# Patient Record
Sex: Male | Born: 1950 | Race: White | Hispanic: No | Marital: Married | Smoking: Former smoker
Health system: Southern US, Community
[De-identification: ages and names within clinical notes are randomized; demographics above are authoritative.]

## PROBLEM LIST (undated history)

## (undated) ENCOUNTER — Ambulatory Visit: Discharge status: Absent without leave | Payer: PPO

## (undated) DIAGNOSIS — C179 Malignant neoplasm of small intestine, unspecified: Secondary | ICD-10-CM

## (undated) DIAGNOSIS — I4891 Unspecified atrial fibrillation: Secondary | ICD-10-CM

## (undated) DIAGNOSIS — Z952 Presence of prosthetic heart valve: Secondary | ICD-10-CM

## (undated) DIAGNOSIS — Z9289 Personal history of other medical treatment: Secondary | ICD-10-CM

## (undated) DIAGNOSIS — D649 Anemia, unspecified: Secondary | ICD-10-CM

## (undated) DIAGNOSIS — Z95810 Presence of automatic (implantable) cardiac defibrillator: Secondary | ICD-10-CM

## (undated) DIAGNOSIS — I5022 Chronic systolic (congestive) heart failure: Secondary | ICD-10-CM

## (undated) DIAGNOSIS — I00 Rheumatic fever without heart involvement: Secondary | ICD-10-CM

## (undated) DIAGNOSIS — T7840XA Allergy, unspecified, initial encounter: Secondary | ICD-10-CM

## (undated) DIAGNOSIS — I509 Heart failure, unspecified: Secondary | ICD-10-CM

## (undated) DIAGNOSIS — R238 Other skin changes: Secondary | ICD-10-CM

## (undated) DIAGNOSIS — I099 Rheumatic heart disease, unspecified: Secondary | ICD-10-CM

## (undated) DIAGNOSIS — M1990 Unspecified osteoarthritis, unspecified site: Secondary | ICD-10-CM

## (undated) DIAGNOSIS — H409 Unspecified glaucoma: Secondary | ICD-10-CM

## (undated) DIAGNOSIS — C8580 Other specified types of non-Hodgkin lymphoma, unspecified site: Secondary | ICD-10-CM

## (undated) DIAGNOSIS — R600 Localized edema: Secondary | ICD-10-CM

## (undated) DIAGNOSIS — N200 Calculus of kidney: Secondary | ICD-10-CM

## (undated) DIAGNOSIS — R609 Edema, unspecified: Secondary | ICD-10-CM

## (undated) DIAGNOSIS — N400 Enlarged prostate without lower urinary tract symptoms: Secondary | ICD-10-CM

## (undated) DIAGNOSIS — R0602 Shortness of breath: Secondary | ICD-10-CM

## (undated) DIAGNOSIS — R351 Nocturia: Secondary | ICD-10-CM

## (undated) DIAGNOSIS — R7401 Elevation of levels of liver transaminase levels: Secondary | ICD-10-CM

## (undated) DIAGNOSIS — I10 Essential (primary) hypertension: Secondary | ICD-10-CM

## (undated) DIAGNOSIS — G4700 Insomnia, unspecified: Secondary | ICD-10-CM

## (undated) DIAGNOSIS — J111 Influenza due to unidentified influenza virus with other respiratory manifestations: Secondary | ICD-10-CM

## (undated) DIAGNOSIS — G629 Polyneuropathy, unspecified: Secondary | ICD-10-CM

## (undated) DIAGNOSIS — K219 Gastro-esophageal reflux disease without esophagitis: Secondary | ICD-10-CM

## (undated) DIAGNOSIS — K5900 Constipation, unspecified: Secondary | ICD-10-CM

## (undated) DIAGNOSIS — E785 Hyperlipidemia, unspecified: Secondary | ICD-10-CM

## (undated) DIAGNOSIS — R233 Spontaneous ecchymoses: Secondary | ICD-10-CM

## (undated) DIAGNOSIS — D509 Iron deficiency anemia, unspecified: Secondary | ICD-10-CM

## (undated) DIAGNOSIS — I350 Nonrheumatic aortic (valve) stenosis: Secondary | ICD-10-CM

## (undated) DIAGNOSIS — E119 Type 2 diabetes mellitus without complications: Secondary | ICD-10-CM

## (undated) DIAGNOSIS — Z860100 Personal history of colon polyps, unspecified: Secondary | ICD-10-CM

## (undated) DIAGNOSIS — I4821 Permanent atrial fibrillation: Secondary | ICD-10-CM

## (undated) DIAGNOSIS — I2510 Atherosclerotic heart disease of native coronary artery without angina pectoris: Secondary | ICD-10-CM

## (undated) HISTORY — PX: CORONARY ARTERY BYPASS GRAFT: SHX141

## (undated) HISTORY — DX: Anemia, unspecified: D64.9

## (undated) HISTORY — DX: Allergy, unspecified, initial encounter: T78.40XA

## (undated) HISTORY — DX: Rheumatic heart disease, unspecified: I09.9

## (undated) HISTORY — DX: Shortness of breath: R06.02

## (undated) HISTORY — PX: SMALL INTESTINE SURGERY: SHX150

## (undated) HISTORY — DX: Essential (primary) hypertension: I10

## (undated) HISTORY — DX: Unspecified atrial fibrillation: I48.91

## (undated) HISTORY — DX: Rheumatic fever without heart involvement: I00

## (undated) HISTORY — PX: COLONOSCOPY: SHX174

## (undated) HISTORY — DX: Heart failure, unspecified: I50.9

## (undated) HISTORY — PX: HERNIA REPAIR: SHX51

## (undated) HISTORY — DX: Atherosclerotic heart disease of native coronary artery without angina pectoris: I25.10

## (undated) HISTORY — DX: Gastro-esophageal reflux disease without esophagitis: K21.9

## (undated) HISTORY — DX: Hyperlipidemia, unspecified: E78.5

## (undated) HISTORY — PX: SEPTOPLASTY: SUR1290

## (undated) HISTORY — DX: Presence of prosthetic heart valve: Z95.2

## (undated) HISTORY — PX: ESOPHAGOGASTRODUODENOSCOPY: SHX1529

---

## 2000-09-10 HISTORY — PX: CORONARY ARTERY BYPASS GRAFT: SHX141

## 2000-09-10 HISTORY — PX: CARDIAC CATHETERIZATION: SHX172

## 2000-10-31 ENCOUNTER — Ambulatory Visit (HOSPITAL_COMMUNITY): Admission: RE | Admit: 2000-10-31 | Discharge: 2000-10-31 | Payer: Self-pay | Admitting: *Deleted

## 2000-11-07 ENCOUNTER — Ambulatory Visit (HOSPITAL_COMMUNITY): Admission: RE | Admit: 2000-11-07 | Discharge: 2000-11-07 | Payer: Self-pay | Admitting: Cardiovascular Disease

## 2000-11-08 ENCOUNTER — Encounter (HOSPITAL_COMMUNITY): Admission: RE | Admit: 2000-11-08 | Discharge: 2001-02-06 | Payer: Self-pay | Admitting: Dentistry

## 2000-11-08 DIAGNOSIS — Z952 Presence of prosthetic heart valve: Secondary | ICD-10-CM

## 2000-11-08 HISTORY — PX: MITRAL VALVE REPLACEMENT: SHX147

## 2000-11-08 HISTORY — DX: Presence of prosthetic heart valve: Z95.2

## 2000-11-18 ENCOUNTER — Encounter: Payer: Self-pay | Admitting: Cardiothoracic Surgery

## 2000-11-20 ENCOUNTER — Inpatient Hospital Stay (HOSPITAL_COMMUNITY): Admission: RE | Admit: 2000-11-20 | Discharge: 2000-11-26 | Payer: Self-pay | Admitting: Cardiothoracic Surgery

## 2000-11-20 ENCOUNTER — Encounter (INDEPENDENT_AMBULATORY_CARE_PROVIDER_SITE_OTHER): Payer: Self-pay | Admitting: *Deleted

## 2000-11-20 ENCOUNTER — Encounter: Payer: Self-pay | Admitting: Cardiothoracic Surgery

## 2000-11-21 ENCOUNTER — Encounter: Payer: Self-pay | Admitting: Surgery

## 2000-11-22 ENCOUNTER — Encounter: Payer: Self-pay | Admitting: Cardiothoracic Surgery

## 2000-11-23 ENCOUNTER — Encounter: Payer: Self-pay | Admitting: Cardiothoracic Surgery

## 2004-04-17 ENCOUNTER — Ambulatory Visit (HOSPITAL_COMMUNITY): Admission: RE | Admit: 2004-04-17 | Discharge: 2004-04-17 | Payer: Self-pay | Admitting: Urology

## 2004-08-23 ENCOUNTER — Ambulatory Visit: Payer: Self-pay | Admitting: Cardiology

## 2004-09-05 ENCOUNTER — Ambulatory Visit: Payer: Self-pay | Admitting: Cardiology

## 2005-01-05 ENCOUNTER — Ambulatory Visit: Payer: Self-pay | Admitting: Family Medicine

## 2005-04-04 ENCOUNTER — Ambulatory Visit: Payer: Self-pay | Admitting: Family Medicine

## 2005-12-13 ENCOUNTER — Ambulatory Visit: Payer: Self-pay | Admitting: Family Medicine

## 2006-01-11 ENCOUNTER — Ambulatory Visit: Payer: Self-pay | Admitting: Family Medicine

## 2006-01-18 ENCOUNTER — Ambulatory Visit: Payer: Self-pay | Admitting: Family Medicine

## 2006-04-22 ENCOUNTER — Ambulatory Visit: Payer: Self-pay | Admitting: Family Medicine

## 2006-08-05 ENCOUNTER — Ambulatory Visit: Payer: Self-pay | Admitting: Family Medicine

## 2008-11-05 ENCOUNTER — Encounter: Payer: Self-pay | Admitting: Pulmonary Disease

## 2008-11-30 ENCOUNTER — Ambulatory Visit: Payer: Self-pay | Admitting: Pulmonary Disease

## 2008-11-30 DIAGNOSIS — I10 Essential (primary) hypertension: Secondary | ICD-10-CM | POA: Insufficient documentation

## 2008-11-30 DIAGNOSIS — J984 Other disorders of lung: Secondary | ICD-10-CM | POA: Insufficient documentation

## 2008-11-30 DIAGNOSIS — E785 Hyperlipidemia, unspecified: Secondary | ICD-10-CM | POA: Insufficient documentation

## 2008-11-30 DIAGNOSIS — J8409 Other alveolar and parieto-alveolar conditions: Secondary | ICD-10-CM | POA: Insufficient documentation

## 2009-03-18 ENCOUNTER — Telehealth: Payer: Self-pay | Admitting: Pulmonary Disease

## 2009-04-06 ENCOUNTER — Encounter: Payer: Self-pay | Admitting: Pulmonary Disease

## 2010-01-11 ENCOUNTER — Ambulatory Visit: Payer: Self-pay | Admitting: Cardiology

## 2010-01-11 DIAGNOSIS — Z9889 Other specified postprocedural states: Secondary | ICD-10-CM | POA: Insufficient documentation

## 2010-01-11 DIAGNOSIS — I209 Angina pectoris, unspecified: Secondary | ICD-10-CM | POA: Insufficient documentation

## 2010-01-11 DIAGNOSIS — I428 Other cardiomyopathies: Secondary | ICD-10-CM | POA: Insufficient documentation

## 2010-01-11 DIAGNOSIS — I482 Chronic atrial fibrillation, unspecified: Secondary | ICD-10-CM | POA: Insufficient documentation

## 2010-01-11 DIAGNOSIS — I4891 Unspecified atrial fibrillation: Secondary | ICD-10-CM | POA: Insufficient documentation

## 2010-02-03 ENCOUNTER — Ambulatory Visit (HOSPITAL_COMMUNITY): Admission: RE | Admit: 2010-02-03 | Discharge: 2010-02-03 | Payer: Self-pay | Admitting: Cardiology

## 2010-02-03 ENCOUNTER — Ambulatory Visit: Payer: Self-pay

## 2010-02-03 ENCOUNTER — Encounter: Payer: Self-pay | Admitting: Cardiology

## 2010-02-03 ENCOUNTER — Ambulatory Visit: Payer: Self-pay | Admitting: Cardiovascular Disease

## 2010-03-27 ENCOUNTER — Telehealth: Payer: Self-pay | Admitting: Pulmonary Disease

## 2010-03-29 ENCOUNTER — Encounter: Payer: Self-pay | Admitting: Pulmonary Disease

## 2010-04-07 ENCOUNTER — Encounter: Payer: Self-pay | Admitting: Pulmonary Disease

## 2010-04-24 ENCOUNTER — Telehealth: Payer: Self-pay | Admitting: Pulmonary Disease

## 2010-10-12 NOTE — Assessment & Plan Note (Signed)
Summary: [facility]   Visit Type:  Initial Consult Primary Provider:  Dr. Barnet Glasgow  CC:  MVR/CAD/Atrial Fibrillation.  History of Present Illness: The patient presents for followup of the above. It has been about 6 years since I last saw him. 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 He had a previous cardiomyopathy though his last ejection fraction was improved. Over the years he's done well. He has had his Coumadin followed and other medications. He's had no issues with this. He is in fibrillation but does not feel this. He actually works a very vigorous job and gets no chest pressure, neck or arm discomfort. He has no palpitations, presyncope or syncope. He has no PND or orthopnea or exercise-induced dyspnea. Couple of weeks ago while sitting on the couch watching TV he did have one episode of chest discomfort in the left mid chest. He never had this kind of discomfort before. It was mild and not associated with other symptoms. There was no radiation. It lasted for several minutes and came and went spontaneously. He has not been able to reproduce this.  Current Medications (verified): 1)  Lantus 100 Unit/ml Soln (Insulin Glargine) .... Use As Directed 2)  Coumadin 5 Mg Tabs (Warfarin Sodium) .... Use As Directed 3)  Actos 45 Mg Tabs (Pioglitazone Hcl) .... Take 1 Tablet By Mouth Once A Day 4)  Glucotrol Xl 10 Mg Xr24h-Tab (Glipizide) .... Take 1 Tablet By Mouth Once A Day 5)  Lasix 40 Mg Tabs (Furosemide) .... Take 1 Tablet By Mouth Once A Day 6)  Aspirin Low Dose 81 Mg Tabs (Aspirin) .... Take 1 Tablet By Mouth Once A Day 7)  Vytorin 10-20 Mg Tabs (Ezetimibe-Simvastatin) .... Take 1 Tablet By Mouth Once A Day 8)  Glucophage 1000 Mg Tabs (Metformin Hcl) .... Take 1 Tablet By Mouth Two Times A Day 9)  Klor-Con M20 20 Meq Cr-Tabs (Potassium Chloride Crys Cr) .... Take 1 Tablet By Mouth Once A Day 10)  Toprol Xl 200 Mg Xr24h-Tab (Metoprolol Succinate) ....  Take 1 Tablet By Mouth Once A Day 11)  Ramipril 5 Mg Caps (Ramipril) .... Take 1 Tablet By Mouth Two Times A Day 12)  Lanoxin 0.25 Mg Tabs (Digoxin) .... Take 1 Tablet By Mouth Once A Day 13)  Fish Oil   Oil (Fish Oil) .Marland Kitchen.. 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 Jude mechanical mitral valve prosthesis. SURGEON:  Lilia Argue. Servando Snare, M.D.  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   Vital Signs:  Patient profile:   60 year old male Height:      72 inches Weight:      253 pounds BMI:     34.44 Pulse rate:   66 / minute Resp:     16 per minute BP sitting:   148 /  88  (right arm)  Vitals Entered By: Levora Angel, CNA (Jan 11, 2010 2:24 PM)  Physical Exam  General:  Well developed, well nourished, in no acute distress. Head:  normocephalic and atraumatic Eyes:  PERRLA/EOM intact; conjunctiva and lids normal. Neck:  Neck supple, no JVD. No masses, thyromegaly or abnormal cervical nodes. Chest Wall:  Well-healed sternotomy scar Lungs:  Clear bilaterally to auscultation and percussion. Abdomen:  Bowel sounds positive; abdomen soft and non-tender without masses, organomegaly, or hernias noted. No hepatosplenomegaly, obese Msk:  Back normal, normal gait. Muscle strength and tone normal. Extremities:  No clubbing or cyanosis. Neurologic:  Alert and  oriented x 3. Skin:  Intact without lesions or rashes. Cervical Nodes:  no significant adenopathy Inguinal Nodes:  no significant adenopathy Psych:  Normal affect.   Detailed Cardiovascular Exam  Neck    Carotids: Carotids full and equal bilaterally without bruits.      Neck Veins: Normal, no JVD.    Heart    Inspection: no deformities or lifts noted.      Palpation: normal PMI with no thrills palpable.      Auscultation: Irregular, S1 mechanical, S2 within normal limits, no S3, no clicks, no rubs, no murmurs.   Vascular    Abdominal Aorta: no palpable masses, pulsations, or audible bruits.      Femoral Pulses: normal femoral pulses bilaterally.      Pedal Pulses: normal pedal pulses bilaterally.      Radial Pulses: normal radial pulses bilaterally.      Peripheral Circulation: no clubbing, cyanosis, or edema noted with normal capillary refill.     EKG  Procedure date:  01/11/2010  Findings:      atrial fibrillation, right axis deviation, no acute ST-T wave changes  Impression & Recommendations:  Problem # 1:  MITRAL VALVE REPLACEMENT, HX OF (ICD-V15.1) The patient seems to be getting along relatively well. He continues with Coumadin and understands endocarditis prophylaxis. Testing will be as described below. Orders: Echocardiogram (Echo)  Problem # 2:  HYPERTENSION (ICD-401.9) He reports that his blood pressure is well controlled otherwise. I will suggest weight loss to contribute to better blood pressure control but not change medicines. He should keep benign on this and let me know if he is running 140/90 or higher.  Problem # 3:  ATRIAL FIBRILLATION (ICD-427.31) He tolerates atrial fibrillation with anticoagulation and rate control. Orders: EKG w/ Interpretation (93000) Echocardiogram (Echo)  Problem # 4:  CHEST PAIN (ICD-786.50) The patient had one episode of atypical chest discomfort. At this point no further cardiovascular testing is suggested but he should  continue with risk reduction. He will let me know if he has any of this in the future.  Problem # 5:  CARDIOMYOPATHY (ICD-425.4) He will get a followup echocardiogram to evaluate his cardiomyopathy and mitral valve replacement.  Patient Instructions: 1)  Your physician recommends that you schedule a follow-up appointment in: 12 months with Dr Percival Spanish in [place] 2)  Your physician recommends that you continue on your current medications as directed. Please refer to the Current Medication list given to you today. 3)  Your physician has requested that you have an echocardiogram.  Echocardiography is a painless test that uses sound waves to create images of your heart. It provides your doctor with information about the size and shape of your heart and how well your heart's chambers and valves are working.  This procedure takes approximately one hour. There are no restrictions for this procedure.

## 2010-10-12 NOTE — Progress Notes (Signed)
Summary: set up ct  Phone Note From Pharmacy   Caller: Patient Call For: Shaylan Tutton Reason for Call: Talk to Nurse Summary of Call: pt called to get CT set up.  Kae Heller told him to call for this to be done in a year.  Pt will call back after three.  Need appt on Friday Morning - Any. Initial call taken by: Zigmund Gottron,  March 27, 2010 9:21 AM  Follow-up for Phone Call        lmotcb Elita Boone [facility]  March 27, 2010 9:38 AM    pt returned call and stated that he was told to come back in 1 year for follow up CT scan.  pt would like this done on this friday morning at [facility] in [place].  please advise.  thanks [place]  March 27, 2010 3:08 PM   Additional Follow-up for Phone Call Additional follow up Details #1::        pt returning the call---wanting this to ct to be set up for this friday at [facility] in [place].  is this ok. Elita Boone CMA  March 29, 2010 2:55 PM

## 2010-10-12 NOTE — Miscellaneous (Signed)
Summary: Orders Update   Clinical Lists Changes  Orders: Added new Referral order of Radiology Referral (Radiology) - Signed 

## 2010-10-12 NOTE — Progress Notes (Signed)
Summary: c t scan    [id number]  Phone Note Call from Patient Call back at [phone]  press #1   Caller: Patient Call For: Sully Manzi Summary of Call: need ct scan ordered Initial call taken by: John [name],  March 18, 2009 2:21 PM  Follow-up for Phone Call        LMOMTCBx1.  Just need to inform pt John [name] out of office.  Will forward message to [facility] for him to put in CT orders when he returns.  Jinny Blossom [facility] LPN  July  9, 624THL 624THL PM    Pt aware that [facility] is out of office til 03-30-09 and then we can get [facility] to put order in. Pt would like to have done before August as he will be out of town the month of August. [place]  March 18, 2009 3:06 PM   Additional Follow-up for Phone Call Additional follow up Details #1::        will order noncontrast ct chest to f/u nodule. Additional Follow-up by: Kathee Delton MD,  March 28, 2009 9:44 AM

## 2010-10-12 NOTE — Miscellaneous (Signed)
Summary: ct chest with  no change in rll nodule.  Clinical Lists Changes  ct chest shows no change in 10mm nodule at right base.  no other abnormality noted. needs follow up in one year.  Appended Document: ct chest with  no change in rll nodule. megan, please let pt know that chest ct looks good.  no change in spot.  needs another follow up in one year.  call us in june to set up.  Appended Document: ct chest with  no change in rll nodule. [id number]  Appended Document: ct chest with  no change in rll nodule. Pt aware of results and KC's recs.

## 2010-10-12 NOTE — Progress Notes (Signed)
Summary: results---waiting on fax  [email or web address]  Phone Note Call from Patient   Caller: Patient Call For: Marvelene Stoneberg Summary of Call: pt wants results of chest CT. [phone] Initial call taken by: Cooper Render, CNA,  April 24, 2010 3:44 PM  Follow-up for Phone Call        [place], Have you seen this report? It was done on 04-07-10 at 1030am at [facility]. Thanks.Clayborne Dana CMA  April 24, 2010 4:09 PM   have not seen report yet.  called radiology at [facility] and and requested CT report be faxed to our office.  Marland KitchenMatthew Folks LPN  August 15, 624THL 4:35 PM   received results and put in [facility] very important look at folder for him to review.  Jinny Blossom Reynolds LPN  August 15, 624THL 4:50 PM   Additional Follow-up for Phone Call Additional follow up Details #1::        please let pt know that his nodule is completely stable, and radiologist feels is benign.  no further f/u required. Additional Follow-up by: Kathee Delton MD,  April 24, 2010 5:26 PM    Additional Follow-up for Phone Call Additional follow up Details #2::    [facility] with co-worker for pt TCB.  Jinny Blossom Reynolds LPN  August 16, 624THL 10:13 AM   called and spoke with pt.  pt aware of ct results and K[facility]'s recs.  pt verbalized understanding and denied any questions.  Jinny Blossom Reynolds LPN  August 16, 624THL 3:24 PM

## 2011-01-26 NOTE — Procedures (Signed)
[facility]  Patient:    FRANCHESCO, DUBRAY                     MRN: [id number] Proc. Date: 11/20/00 Adm. Date:  [id number] Attending:  Montine Circle                           Procedure Report  PROCEDURE:  Intraoperative transesophageal echocardiography.  HISTORY OF PRESENT ILLNESS:  Mr. Estaban Murray is a 60 year old white male with a history of coronary artery disease and rheumatic fever.  He had a previous transesophageal echocardiogram which showed mild to moderate mitral stenosis and moderate mitral insufficiency.  He is now scheduled to undergo coronary artery bypass grafting and mitral valve replacement or repair.  The intraoperative transesophageal echocardiography was requested to evaluate the skin of mitral valve disease and to determine the adequacy of the repair or replacement and also to service his monitor for intraoperative volume status.  DESCRIPTION OF PROCEDURE:  The patient was brought to the operating room at [facility].  General anesthesia was induced without difficulty.  The transesophageal echocardiography probe was inserted into the esophagus without difficulty.  IMPRESSION: PREBYPASS FINDINGS: 1. Mitral valve leaflets were thickened and there appeared to be some fusion    of the commissures with a diastolic bowing of the anterior leaflet.    There was no obvious calcification of the leaflets or subvalvular    apparatus.  Continuous wave Doppler interrogation of the mitral inflow    revealed a mitral valve area of 1.58 by pressure halftime, mean mitral    valve gradient of 6.6 mmHg, and a peak mitral valve gradient of 13.6 mmHg.    This is consistent with mild mitral stenosis.  There was moderate mitral    insufficiency noted.  There was a central jet of mitral regurgitation.  The    pulse wave Doppler interrogation of the left upper pulmonary vein showed    significant blunting of the systolic component  of flow. 2. Aortic sclerosis with trace aortic insufficiency, but no evidence of aortic    stenosis or significant aortic insufficiency.  The aortic valve leaflets    were thickened but opened normally. 3. Left atrial enlargement with spontaneous echo contrast noted in the left    atrial node in the left atrium.  The left atrial appendage was carefully    interrogated and no thrombus was noted in the appendage. 4. Intact intra-atrial septum. 5. Moderate left ventricular dysfunction.  There would appear to be global    left ventricular hypocontractility with an ejection fraction estimated    at 35-40%.  There was moderate left ventricular hypertrophy. 6. Right ventricular size appeared normal.  There was hypocontractility of    the right ventricular free wall. 7. Tricuspid valve appeared normal in function with no tricuspid insufficiency    appreciated.  POSTBYPASS FINDINGS: 1. Successful mitral valve replacement using a bicuspid mechanical prosthesis.    The prosthesis was well seated with no evidence of perivalvular    insufficiency.  The leaflets opened and closed normally with the expected    washing jets of mitral insufficiency noted. 2. Aortic sclerosis with otherwise normal aortic valve function unchanged from    pre bypass. 3. Moderate left ventricular dysfunction with ejection fraction estimated at    35-40%, unchanged from the prebypass study. DD:  11/20/00 TD:  11/20/00 Job: [id number] [id number]

## 2011-01-26 NOTE — Op Note (Signed)
[place]  Patient:    John [name], John [name]                     MRN: [id number] Proc. Date: 11/20/00 Adm. Date:  [id number] Attending:  Montine Circle CC:         Minus Breeding, M.D. 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  OPERATION PERFORMED:  Coronary artery bypass grafting x 2 with the left internal mammary artery to the left anterior descending coronary artery and reversed saphenous vein graft to the distal right coronary artery and mitral valve replacement with a #33 St. Jude mechanical mitral valve prosthesis.  SURGEON:  Lilia Argue. Servando Snare, M.D.  FIRST ASSISTANT:  Vernard Gambles, P.A.  ANESTHESIA:  General.  INDICATIONS FOR PROCEDURE:  The patient is a 60 year old male who has known history of rheumatic heart disease, having had rheumatic fever as a child.  In addition, he has significant and difficult to control diabetes.  Recently because of increasing symptoms of fatigue and shortness of breath with exertion, he was evaluated with echocardiogram and cardiac catheterization by the [facility].  Patient was found to have moderate to severe mitral regurgitation, mild to moderate mitral stenosis and two-vessel coronary artery disease with 80% distal right coronary artery obstruction and 70% mid-LAD obstruction, and depressed left ventricular function with ejection fraction approximately 40%.  Because of the patients symptoms and these findings, mitral valve replacement and coronary artery bypass grafting was recommended to the patient who agreed and signed informed consent.  DESCRIPTION OF PROCEDURE:  The patients weight was approximately 264 pounds and ____________ positioned on  the operating table.  The skin of the chest and legs was prepped with Betadine and draped in the usual sterile manner.  A segment of vein was harvested from the right lower extremity.   A median sternotomy was performed and the left internal mammary artery was dissected down as a pedicle graft.  The distal artery was divided and had excellent free flow.  The pericardium was opened.  The patient did have evidence of biventricular enlargement.  The patient was systemically heparinized.  The ascending aorta was cannulated with a 22 French aortic cannula because of his increased size.  Superior and inferior vena caval cannulas were placed.  A retrograde cardioplegia catheter was placed.  The patient was placed on cardiopulmonary bypass at 2.4 L per minute per meter squared.  Sites for anastomosis were dissected out of the epicardium.  The patients body temperature was then cooled to 28 degrees.  The aortic crossclamp was applied. 500 cc of cold blood potassium cardioplegia was administered antegrade. Throughout the remainder of the procedure, cardioplegia was administered intermittently, retrograde and through the vein graft to the right coronary artery .  Attention was turned first to the distal right coronary artery which was opened and using a running 7-0 Prolene distal anastomosis was performed using 7-0 Prolene with a segment of reversed saphenous vein graft.  The left atrium was then opened through the interatrial groove and with proper retraction, the mitral valve was identified.  The valve was a typical rheumatic valve ____________  with anterolateral commissure being fused and severe thickening of the especially the anterior leaflet.  It was decided to proceed with mitral valve replacement.  The anterior  leaflet was excised.  Two paddles of chordal attachments from the anterior leaflet were preserved.  The portion of the posterior leaflet was excised that was significantly calcified  but numerous chordal attachments were preserved.  The valve was sized for a 33 St. Jude valve.  #2 Tycron sutures with the pledgets on the atrial surface were then placed circumferentially in the mitral valve annulus.  At the areas follow-up the two commissures, the small paddles of the chordal attachments were preserved and tacked to the annulus also.  The valve was then secured in place.  There was free movement of the leaflets and good seating of the valve. The small catheter was placed through the central lumen of the valve.  The atrium was then partially closed with horizontal mattress 3-0 Prolene suture. Attention was then turned to the left anterior descending coronary artery which was opened and using a running 8-0 Prolene, the left internal mammary artery was anastomosed to the left anterior descending coronary artery.  With the release of the bulldog on the mammary artery, there was appropriate rise in myocardial septal temperature.  The heart was allowed to passively fill with the head in the down position, further deairing maneuvers of the heart were carried out.  The catheter across the mitral valve was removed and left atrial closure was completed.  Aortic crossclamp was removed with total crossclamp time of 128 minutes.  A 16 gauge needle was introduced into the left ventricular apex to further deair the heart.  The patient required electrical defibrillation and returned to a sinus rhythm.  Preoperatively, he had been in poorly controlled atrial fibrillation.  He was then ventilated and weaned from cardiopulmonary bypass on milrinone infusion and dopamine infusion.  He was decannulated in the usual fashion.  Protamine sulfate was administered.  With the operative field hemostatic, two atrial and two ventricular pacing wires were applied.  Graft marker was applied.  A left pleural tube and two mediastinal tubes were left in place.  Pericardium reapproximated.  The sternum was  closed with #6 stainless steel wire, the fascia was closed with interrupted 0 Vicryl and running 3-0 Vicryl in the  subcutaneous tissue and 4-0 subcuticular stitch in the skin edges.  Dry dressings were applied.  The sponge and needle counts were reported as correct at completion of the procedure.  The patient tolerated the procedure without obvious complication and was transferred to the surgical intensive care unit for further postoperative care. DD:  11/21/00 TD:  11/21/00 Job: [id number]

## 2011-01-26 NOTE — Cardiovascular Report (Signed)
. [facility]  Patient:    John, [name]                     MRN: [id number] Proc. Date: 10/31/00 Adm. Date:  [id number] Disc. Date: [id number] Attending:  Christy Sartorius CC:         Minus Breeding, M.D. [facility]  Trellis Moment, D.O.   Cardiac Catheterization  PROCEDURE: 1. Right heart catheterization. 2. Left heart catheterization 3. Left ventriculogram. 4. Selective coronary angiography.  DIAGNOSES: 1. Two-vessel coronary artery disease. 2. Moderate left ventricular systolic dysfunction. 3. Mitral stenosis. 4. Mitral regurgitation. 5. Moderate pulmonary hypertension. 6. Atrial fibrillation.  HISTORY:  [name] is a 60 year old gentleman with recently diagnosed atrial fibrillation and congestive heart failure.  The patient was found to have LV dysfunction with mitral stenosis and regurgitation by echocardiogram and is being assessed for mitral valve repair or replacement.  He presents now for further cardiac assessment.  TECHNIQUE:  After informed consent was obtained, the patient was brought to the catheterization lab.  A 6-French arterial and 8-French venous sheath were placed using the modified Seldinger technique.  Right heart catheterization was then performed in the usual fashion using a 7.5-French PA catheter.  Left heart catheterization and selective angiography were then performed using 6-French preformed Judkin catheters.  At the termination of the case, the catheters and sheaths were removed, and manual pressure was applied until adequate hemostasis was achieved.  The patient tolerated the procedure well.  FINDINGS:  1. Right heart catheterization RA: V wave equals 17, mean is 14, RV equals    46/12, PA equals 48/27, pulmonary capillary wedge pressure V wave equals    37, mean is 31.  Cardiac output is 5.0 liters per minutes with cardiac    index of 2.1 liters per minute per square meter by thermodilution.  Cardiac    output  and index by Fick method are 4.5 and 1.9, respectively.  Hemoglobin    is 16.4 mg/dl, PA saturation 67%, aortic saturation 96% on room air.    Mitral valve gradient is 10.1 mmHg with mitral valve area of 1.7 sq cm.    This was an average confirmed by switching transducers and averaging over    10 beats per measurement. 2. Left heart catheterization.    a. Left main trunk: A large caliber vessel, mild irregularities of 20%.    b. LAD:  A large caliber vessel that provides a large first diagonal branch       in the proximal segment and several small diameter branches in the mid       section.  The LAD has a long tubular narrowing of 30% in the proximal       segment.  There was mild diffuse disease in the mid section with a focal       narrowing of 60 to 70% between two small diagonal branches.    c. Left circumflex artery: This is a large caliber vessel that consists of       a large marginal branch in the mid section.  There is mild diffuse       disease of 30% in the left circumflex system.    d. Ramus intermedius: Large caliber vessel that provides the anterolateral       wall.  This vessel has mild irregularities.    e. Right coronary artery:  Dominant large caliber vessel that provides the       posterior descending artery  and two posterior ventricular branches in       the terminal segment.  There is a focal narrowing of 70% in the distal       RCA prior to the takeoff of the posterior descending artery.  The ostium       of the PDA has narrowing of 50%. 3. Left ventriculogram: Dilated end systolic and end diastolic dimensions.    Overall left ventricular function is moderately impaired.  Ejection    fraction approximately 30%.  There is global hypokinesis, although the    basal inferior wall appears vigorous; 3+ mitral regurgitation is noted.    LV pressure is 108/10, aortic 104/71, LV EDP equals 23.  ASSESSMENT/PLAN:  [name] is a 60 year old gentleman with significant mitral  stenosis and regurgitation, two-vessel coronary artery disease, and moderate left ventricular systolic dysfunction.  He also has moderate pulmonary hypertension.  Further assessment of the patients mitral valve will be pursued with transesophageal echocardiogram.  In addition, he will be started on Coumadin for his atrial fibrillation and mitral valvular disease as well as an ACE inhibitor.  Consideration will be given towards mitral valve repair or replacement and possible coronary artery bypass graft surgery. DD:  10/31/00 TD:  11/01/00 Job: [id number]

## 2011-01-26 NOTE — Procedures (Signed)
[facility]  Patient:    John [name], John [name]                       MRN: [id number] Proc. Date: 11/08/00 Attending:  Wallis Bamberg. Johnsie Cancel, M.D. [facility] CC:         Echo Lab  Minus Breeding, M.D. [facility]   Procedure Report  PROCEDURES:  Transesophageal echocardiography.  INDICATIONS:  Mitral stenosis, mitral insufficiency, question suitability for valvoplasty.  SEDATION:  The patient was sedated with 7 mg of Versed and 25 mg of Demerol.  DESCRIPTION OF PROCEDURE:  Using digital technique an Omniplane probe was advanced into the distal esophagus without incident.  A transgastric imaging revealed mild global hypokinesis with an ejection fraction in the 40-45% range.  Mitral valve was dramatic in etiology.  It was quite mobile with minimal calcification and minimal subvalvular calcification.  However, it was not suitable for balloon valvuloplasty due to moderate to severe mitral insufficiency.  The mean gradient across the valve was 6 mmHg with a mitral valve area by pressure halftime in the 1.9 sq cm range.  There was no left atrial appendage thrombus.  The atrial septum was intact without PFO.  There was moderate spontaneous contrast in both atria.  Right ventricle was of normal size.  Imaging of the aorta showed no significant degree.  IMPRESSION: 1. Moderate to severe mitral regurgitation with mild mitral stenosis.    Valve not suitable for vulvoplasty due to degree of mitral regurgitation. 2. No left atrial appendage thrombus but moderate spontaneous contrast in    each atrium. 3. Ejection fraction in the 45% range with mild global hypokinesis. 4. Normal aortic valve. 5. Normal tricuspid valve. 6. No aortic debris. 7. Atrial septum intact.  PLAN:  The patient will follow up with Dr. Servando Snare for valve replacement surgery.  He will also see the dental clinic to make sure his dentition is in good order before surgery. DD:  11/08/00 TD:   11/11/00 Job: [id number]

## 2011-01-26 NOTE — Discharge Summary (Signed)
. [facility]  Patient:    John [name], John [name]                     MRN: [id number] Adm. Date:  [id number] Disc. Date: 11/26/00 Attending:  Montine Circle Dictator:   Darla Lesches, M.D. CC:         Trellis Moment, D.O.  Minus Breeding, M.D. [facility]   Discharge Summary  DATE OF BIRTH:  01-18-1951  ADMISSION DIAGNOSES: 1. Mitral valve disease. 2. Coronary artery disease.  PAST MEDICAL HISTORY: 1. Type 2 diabetes mellitus, (HBA1C 9.5 preadmission). 2. History of rheumatic fever when he was in the fourth grade. 3. History of kidney stones in 2001. 4. Hypertension. 5. Elevated lipids. 6. Recent diagnosis of atrial fibrillation.  ALLERGIES:  No known drug allergies.  DISCHARGE DIAGNOSES: 1. 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 2. Two vessel coronary artery disease, status post mitral valve replacement    and coronary artery bypass grafting.  HOSPITAL COURSE:  John [name] is a 60 year old white male who has a known history of rheumatic fever.  Over the two months prior to admission he noticed increasing shortness of breath particularly at night.  He continued to work with moderately heavy physical exertion, but was complaining of progressive shortness of breath.  Cardiology workup recommended cardiac catheterization. This was done on November 01, 2000, and a transesophageal echocardiogram was done on November 07, 2000.  Cardiac catheterization revealed significant mitral stenosis and regurgitation, two vessel coronary artery disease, and moderate left ventricular systolic dysfunction.  He is also noted to have moderate pulmonary hypertension.  The transesophageal echocardiogram revealed the following mitral valve measurements; transmitral gradient was 5.4 mgHg, mitral valve area by pressure halftime was measured at 1.86 sq cm.  He was referred to [facility]. Servando Snare, M.D. for cardiac surgery evaluation.   After examination of the patient and reviewing records, Dr. Servando Snare agreed that mitral valve replacement and coronary artery bypass grafting was the preferred treatment choice for this man and the surgery was scheduled.  On November 20, 2000, John [name] was electively admitted to [facility] and Dr. Servando Snare.  He underwent an uncomplicated mitral valve replacement with a 33 mm St. Jude mechanical valve, and coronary artery bypass grafting x 2. Grafts placed at the time of the procedure was the left internal mammary artery to the LAD, saphenuos vein graft to the distal right coronary artery. Intraoperative transesophageal echocardiogram was performed by Dr. Linna Caprice both pre and post mitral valve replacement.  At the conclusion of the procedure, he was transferred in stable condition to the SICU.  His postoperative course has been notable for recurrence of his atrial fibrillation.  This has been rate controlled.  He is on Coumadin and digoxin.  Anticoagulation was initiated for his mechanical valve prophylaxis.  The diabetic education was initiated regarding his insulin administration which he has been requiring during his hospitalization and he has also been referred for outpatient diabetic teaching on discharge.  John [name] is making very good progress towards discharge and it is anticipated that he could be ready for discharge home tomorrow, November 26, 2000.  CONDITION ON DISCHARGE:  Improved.  DISCHARGE INSTRUCTIONS:  Discharge instructions include medications, diet, activity, wound care, and follow-up.  Please see the discharge instruction sheet for details.  DISCHARGE MEDICATIONS: 1. Enteric-coated aspirin 81 mg p.o. q.d. 2. Tylox one to two p.o. q.4-6h. p.r.n. for pain. 3. Toprol XL 50 mg q.d.  4. Altace 2.5 mg b.i.d. 5. Lantis Insulin 30 units subcu q.h.s. 6. Lasix 40 mg p.o. q.d. 7. Coumadin as directed by his PT and INR results.  He has also been instructed  to resume his following home medications. 1. Lanoxin 0.25 mg q.d. 2. Actos 30 mg q.d. 3. Glucotrol 10 mg q.d. 4. Potassium chloride 20 mEq q.d. 5. Multivitamin one daily.  FOLLOW-UP:  Arrangements are being made for blood draw for PT/INR on Thursday, March 21.  Outpatient diabetic teaching has been arranged at [facility] program to begin on April 15.  He will have an appointment to see Dr. Percival Spanish in approximately two weeks and have a chest x-ray taken at that time.  He also has an appointment to see Dr. Servando Snare at the [place] office on December 19, 2000. He has also been instructed to make a follow-up appointment to see his primary care Smaran Gaus, Dr. Mallie Mussel. DD:  11/25/00 TD:  11/25/00 Job: [id number]

## 2011-04-13 ENCOUNTER — Encounter: Payer: Self-pay | Admitting: Cardiology

## 2011-05-08 ENCOUNTER — Encounter: Payer: Self-pay | Admitting: Cardiology

## 2011-05-09 ENCOUNTER — Ambulatory Visit (INDEPENDENT_AMBULATORY_CARE_PROVIDER_SITE_OTHER): Payer: Self-pay | Admitting: Cardiology

## 2011-05-09 ENCOUNTER — Encounter: Payer: Self-pay | Admitting: Cardiology

## 2011-05-09 DIAGNOSIS — I4891 Unspecified atrial fibrillation: Secondary | ICD-10-CM

## 2011-05-09 DIAGNOSIS — N529 Male erectile dysfunction, unspecified: Secondary | ICD-10-CM

## 2011-05-09 DIAGNOSIS — I2510 Atherosclerotic heart disease of native coronary artery without angina pectoris: Secondary | ICD-10-CM

## 2011-05-09 DIAGNOSIS — Z9889 Other specified postprocedural states: Secondary | ICD-10-CM

## 2011-05-09 DIAGNOSIS — I428 Other cardiomyopathies: Secondary | ICD-10-CM

## 2011-05-09 DIAGNOSIS — E669 Obesity, unspecified: Secondary | ICD-10-CM

## 2011-05-09 DIAGNOSIS — E785 Hyperlipidemia, unspecified: Secondary | ICD-10-CM

## 2011-05-09 DIAGNOSIS — I10 Essential (primary) hypertension: Secondary | ICD-10-CM

## 2011-05-09 MED ORDER — VARDENAFIL HCL 20 MG PO TABS: 20.0000 mg | ORAL_TABLET | ORAL | Status: DC | PRN

## 2011-05-09 NOTE — Assessment & Plan Note (Signed)
His EF was slightly low on echo last year.  No change in therapy is inidcated.

## 2011-05-09 NOTE — Assessment & Plan Note (Signed)
The patient understands the need to lose weight with diet and exercise. We have discussed specific strategies for this.  

## 2011-05-09 NOTE — Patient Instructions (Addendum)
Your physician has requested that you have a lexiscan myoview. For further information please visit [email or web address]. Please follow instruction sheet, as given.  The current medical regimen is effective;  continue present plan and medications.  Follow up in 1 year with Dr Percival Spanish.  You will receive a letter in the mail 2 months before you are due.  Please call us when you receive this letter to schedule your follow up appointment.

## 2011-05-09 NOTE — Assessment & Plan Note (Signed)
He tolerates this rhythm and rate control and anticoagulation. We will continue with the meds as listed.

## 2011-05-09 NOTE — Progress Notes (Signed)
HPI The patient presents for yearly followup. Since I last saw him he has had no new cardiovascular problems. He has had cellulitis and back problems. He works regularly and does not report any new chest pressure, neck or arm discomfort. He doesn't notice any palpitations, presyncope or syncope. He doesn't report any resting shortness of breath, PND or orthopnea. He doesn't exercise routinely. He doesn't watch his diet.  He does have lower extremity edema.  No Known Allergies  Current Outpatient Prescriptions  Medication Sig Dispense Refill  . aspirin 81 MG tablet Take 81 mg by mouth daily.        . digoxin (LANOXIN) 0.25 MG tablet Take 250 mcg by mouth daily.        Marland Kitchen ezetimibe-simvastatin (VYTORIN) 10-20 MG per tablet Take 1 tablet by mouth daily.        . furosemide (LASIX) 40 MG tablet Take 40 mg by mouth daily.        Marland Kitchen glipiZIDE (GLUCOTROL XL) 10 MG 24 hr tablet Take 10 mg by mouth daily.        . insulin glargine (LANTUS) 100 UNIT/ML injection Inject into the skin as directed.        . metFORMIN (GLUCOPHAGE) 1000 MG tablet Take 1,000 mg by mouth 2 (two) times daily.        . metoprolol (TOPROL-XL) 200 MG 24 hr tablet Take 200 mg by mouth daily.        . Omega-3 Fatty Acids (FISH OIL) 1000 MG CAPS Take 1 capsule by mouth daily.        . pioglitazone (ACTOS) 45 MG tablet Take 45 mg by mouth daily.        . potassium chloride SA (K-DUR,KLOR-CON) 20 MEQ tablet Take 20 mEq by mouth daily.        . ramipril (ALTACE) 5 MG capsule Take 5 mg by mouth 2 (two) times daily.        Marland Kitchen warfarin (COUMADIN) 5 MG tablet Take 5 mg by mouth as directed.          Past Medical History  Diagnosis Date  . DM (diabetes mellitus)   . HLD (hyperlipidemia)   . HTN (hypertension)   . Rheumatic fever     as a child  . CAD (coronary artery disease)     Past Surgical History  Procedure Date  . Coronary artery bypass graft     lef tmammary artery to LAD coronary artery and reversed SVG to distal rt coronary  artery   . 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 Dr. Servando Snare    ROS:  As stated in the HPI and negative for all other systems.  PHYSICAL EXAM BP 126/76  Pulse 67  Resp 16  Ht 5\' 11"  (1.803 m)  Wt 261 lb (118.389 kg)  BMI 36.40 kg/m2 GENERAL:  Well appearing HEENT:  Pupils equal round and reactive, fundi not visualized, oral mucosa unremarkable NECK:  No jugular venous distention, waveform within normal limits, carotid upstroke brisk and symmetric, no bruits, no thyromegaly LYMPHATICS:  No cervical, inguinal adenopathy LUNGS:  Clear to auscultation bilaterally BACK:  No CVA tenderness CHEST:  Well healed sternotomy scar. HEART:  PMI not displaced or sustained, mechanical S1,  S2 within normal limits, no S3 , no clicks, no rubs, no murmurs, irregular ABD:  Flat, positive bowel sounds normal in frequency in pitch, no bruits, no rebound, no guarding, no midline pulsatile mass, no hepatomegaly,  no splenomegaly EXT:  2 plus pulses throughout, no edema, no cyanosis no clubbing SKIN:  No rashes no nodules NEURO:  Cranial nerves II through XII grossly intact, motor grossly intact throughout PSYCH:  Cognitively intact, oriented to person place and time  EKG: Atrial fibrillation, rate 67 Right axis deviation, no acute ST-T wave changes  ASSESSMENT AND PLAN

## 2011-05-09 NOTE — Assessment & Plan Note (Signed)
It has been 10 years since his bypass.  He needs stress perfusion imaging.

## 2011-05-09 NOTE — Assessment & Plan Note (Signed)
The blood pressure is at target. No change in medications is indicated. We will continue with therapeutic lifestyle changes (TLC).  

## 2011-05-09 NOTE — Assessment & Plan Note (Signed)
This was stable on echo last year.  No change in therapy or further imaging is indicated.

## 2011-05-15 ENCOUNTER — Other Ambulatory Visit: Payer: Self-pay | Admitting: *Deleted

## 2011-05-15 ENCOUNTER — Telehealth: Payer: Self-pay | Admitting: Cardiology

## 2011-05-15 DIAGNOSIS — I2510 Atherosclerotic heart disease of native coronary artery without angina pectoris: Secondary | ICD-10-CM

## 2011-05-15 NOTE — Telephone Encounter (Signed)
Pt wife calling regarding setting up a stress test for pt. Pt was told someone would be calling to set up stress test following pt appt in [place] last week. Please return pt call to discuss.

## 2011-05-15 NOTE — Telephone Encounter (Signed)
Pt needs a lexiscan myoview and will be scheduled.  He has been given instruction already.

## 2011-05-24 ENCOUNTER — Other Ambulatory Visit: Payer: Self-pay | Admitting: Cardiology

## 2011-05-24 DIAGNOSIS — N529 Male erectile dysfunction, unspecified: Secondary | ICD-10-CM

## 2011-05-24 MED ORDER — VARDENAFIL HCL 20 MG PO TABS: 20.0000 mg | ORAL_TABLET | ORAL | Status: AC | PRN

## 2011-05-24 NOTE — Telephone Encounter (Signed)
Pt calling to check on status of RX. Pt said she just called pharmacy and RX was not ready. Pt was told by Pam that she would call in RX. Please call RX in for pt.

## 2011-05-24 NOTE — Telephone Encounter (Signed)
Per pharmacy - medications was approved and pt has already picked it up.

## 2011-07-23 ENCOUNTER — Ambulatory Visit (HOSPITAL_COMMUNITY): Payer: PRIVATE HEALTH INSURANCE | Attending: Cardiology | Admitting: Radiology

## 2011-07-23 VITALS — Ht 72.0 in | Wt 255.0 lb

## 2011-07-23 DIAGNOSIS — R0789 Other chest pain: Secondary | ICD-10-CM | POA: Insufficient documentation

## 2011-07-23 DIAGNOSIS — I25810 Atherosclerosis of coronary artery bypass graft(s) without angina pectoris: Secondary | ICD-10-CM

## 2011-07-23 DIAGNOSIS — Z951 Presence of aortocoronary bypass graft: Secondary | ICD-10-CM | POA: Insufficient documentation

## 2011-07-23 DIAGNOSIS — I4891 Unspecified atrial fibrillation: Secondary | ICD-10-CM

## 2011-07-23 DIAGNOSIS — E785 Hyperlipidemia, unspecified: Secondary | ICD-10-CM | POA: Insufficient documentation

## 2011-07-23 DIAGNOSIS — I10 Essential (primary) hypertension: Secondary | ICD-10-CM | POA: Insufficient documentation

## 2011-07-23 DIAGNOSIS — Z87891 Personal history of nicotine dependence: Secondary | ICD-10-CM | POA: Insufficient documentation

## 2011-07-23 DIAGNOSIS — R079 Chest pain, unspecified: Secondary | ICD-10-CM

## 2011-07-23 DIAGNOSIS — E119 Type 2 diabetes mellitus without complications: Secondary | ICD-10-CM

## 2011-07-23 DIAGNOSIS — R61 Generalized hyperhidrosis: Secondary | ICD-10-CM | POA: Insufficient documentation

## 2011-07-23 DIAGNOSIS — I2510 Atherosclerotic heart disease of native coronary artery without angina pectoris: Secondary | ICD-10-CM

## 2011-07-23 DIAGNOSIS — E669 Obesity, unspecified: Secondary | ICD-10-CM | POA: Insufficient documentation

## 2011-07-23 MED ORDER — REGADENOSON 0.4 MG/5ML IV SOLN
0.4000 mg | Freq: Once | INTRAVENOUS | Status: AC
  Administered 2011-07-23: 0.4 mg via INTRAVENOUS

## 2011-07-23 MED ORDER — TECHNETIUM TC 99M TETROFOSMIN IV KIT
11.0000 | PACK | Freq: Once | INTRAVENOUS | Status: AC | PRN
  Administered 2011-07-23: 11 via INTRAVENOUS

## 2011-07-23 MED ORDER — TECHNETIUM TC 99M TETROFOSMIN IV KIT
33.0000 | PACK | Freq: Once | INTRAVENOUS | Status: AC | PRN
  Administered 2011-07-23: 33 via INTRAVENOUS

## 2011-07-23 NOTE — Progress Notes (Signed)
[facility] 3 NUCLEAR MED [place] [address] [phone]  Cardiology Nuclear Med Study  John [name] is a 60 y.o. male [id number] 1951/01/25   Nuclear Med Background Indication for Stress Test:  Evaluation for Ischemia and Graft Patency History:  '02 CABG with MVR; '11 Echo:EF=45-50%, diffuse hypokinesis; h/o atrial fibrillation and cardiomyopathy Cardiac Risk Factors: Hypertension, IDDM Type 2, Lipids, Obesity and Smoker-Occ. Cigar  Symptoms:  Chest Tightness with Exertion (last episode of chest discomfort was in July when he had stopped his Lasix, per patient) and Diaphoresis   Nuclear Pre-Procedure Caffeine/Decaff Intake:  None NPO After: 8:30pm   Lungs:  Clear. IV 0.9% NS with Angio Cath:  20g  IV Site: R Antecubital  IV Started by:  Eliezer Lofts, EMT-P  Chest Size (in):  48 Cup Size: n/a  Height: 6' (1.829 m)  Weight:  255 lb (115.667 kg)  BMI:  Body mass index is 34.58 kg/(m^2). Tech Comments:  Metoprolol held > 24 hours; CBG= 130 @ 7am, per patient,    Nuclear Med Study 1 or 2 day study: 1 day  Stress Test Type:  Treadmill/Lexiscan  Reading MD: Dola Argyle, MD  Order Authorizing Provider:  Minus Breeding, MD  Resting Radionuclide: Technetium 57m Tetrofosmin  Resting Radionuclide Dose: 11.0 mCi   Stress Radionuclide:  Technetium 43m Tetrofosmin  Stress Radionuclide Dose: 33.0 mCi           Stress Protocol Rest HR: 64 Stress HR: 96  Rest BP: Sitting:132/66  Standing:143/79 Stress BP: 155/60  Exercise Time (min): 6:00 METS: 4.9   Predicted Max HR: 162 bpm % Max HR: 59.26 bpm Rate Pressure Product: 14880   Dose of Adenosine (mg):  n/a Dose of Lexiscan: 0.4 mg  Dose of Atropine (mg): n/a Dose of Dobutamine: n/a mcg/kg/min (at max HR)  Stress Test Technologist: Letta Moynahan, CMA-N  Nuclear Technologist:  Charlton Amor, CNMT     Rest Procedure:  Myocardial perfusion imaging was performed at rest 45 minutes following  the intravenous administration of Technetium 53m Tetrofosmin.  Rest ECG: Atrial Fibrilliation  Stress Procedure: The patient initially walked the treadmill for four minutes utilizing the Bruce protocol, but was unable to obtain his target heart rate.  He then received IV Lexiscan 0.4 mg over 15-seconds with concurrent low level exercise and then Technetium 51m Tetrofosmin was injected at 30-seconds while the patient continued walking one more minute.  There were nonspecific ST-T wave changes and occasional PVC's in recovery with Lexiscan.  Quantitative spect images were obtained after a 45-minute delay.  Stress ECG: No significant change from baseline ECG  QPS Raw Data Images:  Normal; no motion artifact; normal heart/lung ratio. Stress Images:  Moderate decrease in uptake at the base inferolateral segment. Rest Images:  Normal homogeneous uptake in all areas of the myocardium. Subtraction (SDS):  Ischemia at the base inferolateral segment Transient Ischemic Dilatation (Normal <1.22):  0.94 Lung/Heart Ratio (Normal <0.45):  0.40  Quantitative Gated Spect Images QGS EDV:  195 ml QGS ESV:  108 ml QGS cine images:  Hypokinesis at the base of the inferior wall. QGS EF: 45%  Impression Exercise Capacity:  Lexiscan with low level exercise. BP Response:  Normal blood pressure response. Clinical Symptoms:  SOB ECG Impression:  No change Comparison with Prior Nuclear Study: No previous nuclear study performed  Overall Impression:  There is ischemia and possibly some scar at the base of the iferolateral wall.  Dola Argyle

## 2011-08-06 ENCOUNTER — Telehealth: Payer: Self-pay | Admitting: Cardiology

## 2011-08-06 NOTE — Telephone Encounter (Signed)
F/U from previous call.:  Returning call -  test results.

## 2011-08-06 NOTE — Telephone Encounter (Signed)
Attempt to reach Mr John [name]. Phone number provided on message incorrect. Tried home phone listed in snap shot messages left for pt to call back. Need to collect new contact numbers and cell number if available. Pt to have follow-up scheduled with Dr Percival Spanish to review the results of stress test done Nov. 12, 2012.

## 2011-08-07 NOTE — Telephone Encounter (Signed)
Fu call Pt returning your call leave message.

## 2011-08-07 NOTE — Telephone Encounter (Signed)
N/A.  LMTC. 

## 2011-08-08 NOTE — Telephone Encounter (Signed)
Pt's wife Marcie Bal calling back the correct number is [phone], husband is a driver and can't be reached

## 2011-08-08 NOTE — Telephone Encounter (Signed)
Spoke with patient's wife, stress results and MD's recommendations given. The scheduler will call patient's wife for F/U appointment in [place], wife aware.

## 2011-08-22 ENCOUNTER — Encounter: Payer: Self-pay | Admitting: Cardiology

## 2011-08-27 ENCOUNTER — Telehealth: Payer: Self-pay | Admitting: Cardiology

## 2011-08-27 NOTE — Telephone Encounter (Signed)
Pt having trouble getting Lavetra 20mg  prn due to insurance issues spouse thinks the samething happen that happen last time so she needs this over ride at [facility] in [place].

## 2011-08-30 NOTE — Telephone Encounter (Signed)
Per pts insurance company that I contacted at [phone] - no medications for erectile disfunction are covered by his plan.  Pt will have to call the insurance company to discuss his plan and needs for changes.  Left message for pt's wife at number listed,

## 2011-09-07 ENCOUNTER — Ambulatory Visit (INDEPENDENT_AMBULATORY_CARE_PROVIDER_SITE_OTHER): Payer: PRIVATE HEALTH INSURANCE | Admitting: Cardiology

## 2011-09-07 ENCOUNTER — Encounter: Payer: Self-pay | Admitting: Cardiology

## 2011-09-07 VITALS — BP 130/72 | HR 62 | Ht 72.0 in | Wt 259.0 lb

## 2011-09-07 DIAGNOSIS — I4891 Unspecified atrial fibrillation: Secondary | ICD-10-CM

## 2011-09-07 DIAGNOSIS — I428 Other cardiomyopathies: Secondary | ICD-10-CM

## 2011-09-07 DIAGNOSIS — Z9889 Other specified postprocedural states: Secondary | ICD-10-CM

## 2011-09-07 DIAGNOSIS — I2510 Atherosclerotic heart disease of native coronary artery without angina pectoris: Secondary | ICD-10-CM

## 2011-09-07 NOTE — Assessment & Plan Note (Signed)
I had a long discussion with the patient today. He has a very vigorous and active lifestyle. With this he denies any cardiovascular symptoms. Given this and the low risk findings on this study, though abnormal, I do not think cardiac catheterization is indicated. He will continue with aggressive risk reduction. If he does have any symptoms going forward I would have a low threshold for further evaluation.

## 2011-09-07 NOTE — Assessment & Plan Note (Signed)
The patient  tolerates this rhythm and rate control and anticoagulation. We will continue with the meds as listed.

## 2011-09-07 NOTE — Assessment & Plan Note (Signed)
The patient had an echocardiogram in 2011. He continues with anticoagulation. No other imaging is indicated at this point.

## 2011-09-07 NOTE — Assessment & Plan Note (Signed)
He has a mildly reduced ejection fraction. However, he has had no symptoms recently since restarting diuretics this summer. His ejection fraction is stable. He will continue on the meds as listed. I will likely order an echocardiogram again when I see him next as it will have been 2 years.

## 2011-09-07 NOTE — Patient Instructions (Signed)
The current medical regimen is effective;  continue present plan and medications.  Follow up in 6 months with Dr Hochrein.  You will receive a letter in the mail 2 months before you are due.  Please call us when you receive this letter to schedule your follow up appointment.  

## 2011-09-07 NOTE — Progress Notes (Signed)
HPI The patient presents for followufp CAD. Had him for a recent stress perfusion study as it had been 10 years since bypass. His ejection fraction is unchanged from her recent echo and actually better than it was at the time of bypass. His EF is about 45%. However, there was some inferior wall ischemia. I reviewed his catheterization from several years ago.  He did have a PDA lesion at that time.  The patient denies any new symptoms such as chest discomfort, neck or arm discomfort. There has been no new shortness of breath, PND or orthopnea. There have been no reported palpitations, presyncope or syncope.  He is very physically active climbing stairs many times per day.  No Known Allergies  Current Outpatient Prescriptions  Medication Sig Dispense Refill  . aspirin 81 MG tablet Take 81 mg by mouth daily.        . beta carotene w/minerals (OCUVITE) tablet Take 1 tablet by mouth daily.        . digoxin (LANOXIN) 0.25 MG tablet Take 250 mcg by mouth daily.        Marland Kitchen ezetimibe-simvastatin (VYTORIN) 10-20 MG per tablet Take 1 tablet by mouth daily.        . furosemide (LASIX) 40 MG tablet Take 40 mg by mouth daily.       Marland Kitchen glipiZIDE (GLUCOTROL XL) 10 MG 24 hr tablet Take 10 mg by mouth daily.        . insulin glargine (LANTUS) 100 UNIT/ML injection Inject 80 Units into the skin as directed.       . metFORMIN (GLUCOPHAGE) 1000 MG tablet Take 1,000 mg by mouth 2 (two) times daily.        . metoprolol (TOPROL-XL) 200 MG 24 hr tablet Take 200 mg by mouth daily.        . Misc Natural Products (OSTEO BI-FLEX ADV TRIPLE ST) TABS Take 500 each by mouth 2 (two) times daily.        . Omega-3 Fatty Acids (FISH OIL) 1000 MG CAPS Take 1 capsule by mouth daily.        . pioglitazone (ACTOS) 45 MG tablet Take 45 mg by mouth daily.        . potassium chloride SA (K-DUR,KLOR-CON) 20 MEQ tablet Take 20 mEq by mouth daily.        . ramipril (ALTACE) 5 MG capsule Take 5 mg by mouth 2 (two) times daily.        .  sitaGLIPtin (JANUVIA) 100 MG tablet Take 100 mg by mouth daily.        Marland Kitchen warfarin (COUMADIN) 5 MG tablet Take 5 mg by mouth as directed.          Past Medical History  Diagnosis Date  . DM (diabetes mellitus)   . HLD (hyperlipidemia)   . HTN (hypertension)   . Rheumatic fever     as a child  . CAD (coronary artery disease)     Past Surgical History  Procedure Date  . Coronary artery bypass graft     lef tmammary artery to LAD coronary artery and reversed SVG to distal rt coronary artery   . 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 Dr. Servando Snare    ROS:  As stated in the HPI and negative for all other systems.  PHYSICAL EXAM BP 130/72  Pulse 62  Ht 6' (1.829 m)  Wt 259 lb (117.482 kg)  BMI 35.13 kg/m2 GENERAL:  Well appearing HEENT:  Pupils equal round and reactive, fundi not visualized, oral mucosa unremarkable NECK:  No jugular venous distention, waveform within normal limits, carotid upstroke brisk and symmetric, no bruits, no thyromegaly LYMPHATICS:  No cervical, inguinal adenopathy LUNGS:  Clear to auscultation bilaterally BACK:  No CVA tenderness CHEST:  Well healed sternotomy scar. HEART:  PMI not displaced or sustained, mechanical S1,  S2 within normal limits, no S3 , no clicks, no rubs, no murmurs, irregular ABD:  Flat, positive bowel sounds normal in frequency in pitch, no bruits, no rebound, no guarding, no midline pulsatile mass, no hepatomegaly, no splenomegaly EXT:  2 plus pulses throughout, no edema, no cyanosis no clubbing SKIN:  No rashes no nodules NEURO:  Cranial nerves II through XII grossly intact, motor grossly intact throughout PSYCH:  Cognitively intact, oriented to person place and time  EKG: Atrial fibrillation, rate 62 Right axis deviation, no acute ST-T wave changes. 09/07/2011   ASSESSMENT AND PLAN

## 2011-09-13 ENCOUNTER — Telehealth: Payer: Self-pay | Admitting: Cardiology

## 2011-09-13 NOTE — Telephone Encounter (Signed)
Pt did not understand the conversation at his last office visit.  Reviewed information with pts wife who states understanding.  She reports that his viagra has not been helping unless he takes large doses (200 mg) suggested pt follow up with a urologist.  Wife is in agreement

## 2011-09-13 NOTE — Telephone Encounter (Signed)
New msg Pt's wife called She said that he didn't understand what Dr Percival Spanish was telling him during the visit. Please call her back

## 2011-09-29 ENCOUNTER — Emergency Department (INDEPENDENT_AMBULATORY_CARE_PROVIDER_SITE_OTHER): Payer: No Typology Code available for payment source

## 2011-09-29 ENCOUNTER — Encounter (HOSPITAL_BASED_OUTPATIENT_CLINIC_OR_DEPARTMENT_OTHER): Payer: Self-pay

## 2011-09-29 ENCOUNTER — Emergency Department (HOSPITAL_BASED_OUTPATIENT_CLINIC_OR_DEPARTMENT_OTHER)
Admission: EM | Admit: 2011-09-29 | Discharge: 2011-09-29 | Disposition: A | Discharge status: Home / Self Care | Payer: No Typology Code available for payment source | Attending: Emergency Medicine | Admitting: Emergency Medicine

## 2011-09-29 ENCOUNTER — Other Ambulatory Visit: Payer: Self-pay

## 2011-09-29 ENCOUNTER — Emergency Department (HOSPITAL_BASED_OUTPATIENT_CLINIC_OR_DEPARTMENT_OTHER): Payer: No Typology Code available for payment source

## 2011-09-29 DIAGNOSIS — Z951 Presence of aortocoronary bypass graft: Secondary | ICD-10-CM | POA: Insufficient documentation

## 2011-09-29 DIAGNOSIS — IMO0002 Reserved for concepts with insufficient information to code with codable children: Secondary | ICD-10-CM | POA: Insufficient documentation

## 2011-09-29 DIAGNOSIS — S39012A Strain of muscle, fascia and tendon of lower back, initial encounter: Secondary | ICD-10-CM

## 2011-09-29 DIAGNOSIS — Z7901 Long term (current) use of anticoagulants: Secondary | ICD-10-CM | POA: Insufficient documentation

## 2011-09-29 DIAGNOSIS — Z79899 Other long term (current) drug therapy: Secondary | ICD-10-CM | POA: Insufficient documentation

## 2011-09-29 DIAGNOSIS — I10 Essential (primary) hypertension: Secondary | ICD-10-CM | POA: Insufficient documentation

## 2011-09-29 DIAGNOSIS — I2510 Atherosclerotic heart disease of native coronary artery without angina pectoris: Secondary | ICD-10-CM | POA: Insufficient documentation

## 2011-09-29 DIAGNOSIS — I4891 Unspecified atrial fibrillation: Secondary | ICD-10-CM | POA: Insufficient documentation

## 2011-09-29 DIAGNOSIS — R109 Unspecified abdominal pain: Secondary | ICD-10-CM

## 2011-09-29 DIAGNOSIS — M25569 Pain in unspecified knee: Secondary | ICD-10-CM

## 2011-09-29 DIAGNOSIS — S301XXA Contusion of abdominal wall, initial encounter: Secondary | ICD-10-CM | POA: Insufficient documentation

## 2011-09-29 DIAGNOSIS — S8000XA Contusion of unspecified knee, initial encounter: Secondary | ICD-10-CM

## 2011-09-29 DIAGNOSIS — S20219A Contusion of unspecified front wall of thorax, initial encounter: Secondary | ICD-10-CM | POA: Insufficient documentation

## 2011-09-29 DIAGNOSIS — S335XXA Sprain of ligaments of lumbar spine, initial encounter: Secondary | ICD-10-CM | POA: Insufficient documentation

## 2011-09-29 DIAGNOSIS — R079 Chest pain, unspecified: Secondary | ICD-10-CM

## 2011-09-29 DIAGNOSIS — Z7982 Long term (current) use of aspirin: Secondary | ICD-10-CM | POA: Insufficient documentation

## 2011-09-29 DIAGNOSIS — Z0389 Encounter for observation for other suspected diseases and conditions ruled out: Secondary | ICD-10-CM

## 2011-09-29 DIAGNOSIS — Y998 Other external cause status: Secondary | ICD-10-CM | POA: Insufficient documentation

## 2011-09-29 DIAGNOSIS — E785 Hyperlipidemia, unspecified: Secondary | ICD-10-CM | POA: Insufficient documentation

## 2011-09-29 DIAGNOSIS — E119 Type 2 diabetes mellitus without complications: Secondary | ICD-10-CM | POA: Insufficient documentation

## 2011-09-29 DIAGNOSIS — Y92410 Unspecified street and highway as the place of occurrence of the external cause: Secondary | ICD-10-CM | POA: Insufficient documentation

## 2011-09-29 DIAGNOSIS — Z95812 Presence of fully implantable artificial heart: Secondary | ICD-10-CM | POA: Insufficient documentation

## 2011-09-29 LAB — CBC
HCT: 41.7 % (ref 39.0–52.0)
Hemoglobin: 14.4 g/dL (ref 13.0–17.0)
MCH: 30.3 pg (ref 26.0–34.0)
MCHC: 34.5 g/dL (ref 30.0–36.0)
MCV: 87.8 fL (ref 78.0–100.0)
Platelets: 208 10*3/uL (ref 150–400)
RBC: 4.75 MIL/uL (ref 4.22–5.81)
RDW: 13.8 % (ref 11.5–15.5)
WBC: 7 10*3/uL (ref 4.0–10.5)

## 2011-09-29 LAB — BASIC METABOLIC PANEL
BUN: 31 mg/dL — ABNORMAL HIGH (ref 6–23)
CO2: 27 mEq/L (ref 19–32)
Calcium: 9.6 mg/dL (ref 8.4–10.5)
Chloride: 105 mEq/L (ref 96–112)
Creatinine, Ser: 1.1 mg/dL (ref 0.50–1.35)
GFR calc Af Amer: 84 mL/min — ABNORMAL LOW (ref >90)
GFR calc non Af Amer: 72 mL/min — ABNORMAL LOW (ref >90)
Glucose, Bld: 131 mg/dL — ABNORMAL HIGH (ref 70–99)
Potassium: 4.3 mEq/L (ref 3.5–5.1)
Sodium: 142 mEq/L (ref 135–145)

## 2011-09-29 LAB — PROTIME-INR
INR: 2.2 — ABNORMAL HIGH (ref 0.00–1.49)
Prothrombin Time: 24.8 seconds — ABNORMAL HIGH (ref 11.6–15.2)

## 2011-09-29 MED ORDER — TETANUS-DIPHTH-ACELL PERTUSSIS 5-2.5-18.5 LF-MCG/0.5 IM SUSP
0.5000 mL | Freq: Once | INTRAMUSCULAR | Status: AC
  Administered 2011-09-29: 0.5 mL via INTRAMUSCULAR
  Filled 2011-09-29: qty 0.5

## 2011-09-29 MED ORDER — OXYCODONE-ACETAMINOPHEN 5-325 MG PO TABS: 2.0000 | ORAL_TABLET | ORAL | Status: AC | PRN

## 2011-09-29 MED ORDER — IOHEXOL 300 MG/ML  SOLN
100.0000 mL | Freq: Once | INTRAMUSCULAR | Status: AC | PRN
  Administered 2011-09-29: 100 mL via INTRAVENOUS

## 2011-09-29 MED ORDER — IOHEXOL 300 MG/ML  SOLN
20.0000 mL | INTRAMUSCULAR | Status: AC
  Administered 2011-09-29 (×2): 20 mL via ORAL

## 2011-09-29 NOTE — ED Notes (Signed)
Patient informed to DC metformin for 48 hours per radiology protocol after contrast injection.  Madelon Lips RN given Metformin post contrast order form and a copy was made for patients records. Eulas Post
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
## 2011-09-29 NOTE — ED Notes (Signed)
Pt drinking contrast at present.

## 2012-03-05 ENCOUNTER — Encounter: Payer: Self-pay | Admitting: Cardiology

## 2012-03-05 ENCOUNTER — Ambulatory Visit (INDEPENDENT_AMBULATORY_CARE_PROVIDER_SITE_OTHER): Payer: 59 | Admitting: Cardiology

## 2012-03-05 VITALS — BP 120/80 | HR 63 | Ht 72.0 in | Wt 258.0 lb

## 2012-03-05 DIAGNOSIS — I10 Essential (primary) hypertension: Secondary | ICD-10-CM

## 2012-03-05 DIAGNOSIS — I2510 Atherosclerotic heart disease of native coronary artery without angina pectoris: Secondary | ICD-10-CM

## 2012-03-05 DIAGNOSIS — Z9889 Other specified postprocedural states: Secondary | ICD-10-CM

## 2012-03-05 DIAGNOSIS — E669 Obesity, unspecified: Secondary | ICD-10-CM

## 2012-03-05 DIAGNOSIS — I4891 Unspecified atrial fibrillation: Secondary | ICD-10-CM

## 2012-03-05 NOTE — Assessment & Plan Note (Signed)
The patient  tolerates this rhythm and rate control and anticoagulation. We will continue with the meds as listed.

## 2012-03-05 NOTE — Assessment & Plan Note (Signed)
This seems to be functioning well. No change in therapy is indicated. No further imaging is indicated.

## 2012-03-05 NOTE — Progress Notes (Signed)
HPI The patient presents for followufp CAD and MVR.  Since I last saw him he has done well.  The patient denies any new symptoms such as chest discomfort, neck or arm discomfort. There has been no new shortness of breath, PND or orthopnea. There have been no reported palpitations, presyncope or syncope. He works very hard at work and at home. He cannot bring on any symptoms.   No Known Allergies  Current Outpatient Prescriptions  Medication Sig Dispense Refill  . aspirin 81 MG tablet Take 81 mg by mouth daily.        . beta carotene w/minerals (OCUVITE) tablet Take 1 tablet by mouth daily.        . digoxin (LANOXIN) 0.25 MG tablet Take 250 mcg by mouth daily.        Marland Kitchen esomeprazole (NEXIUM) 40 MG capsule Take 40 mg by mouth daily before breakfast.      . ezetimibe-simvastatin (VYTORIN) 10-20 MG per tablet Take 1 tablet by mouth daily.        . furosemide (LASIX) 40 MG tablet Take 40 mg by mouth daily.       Marland Kitchen glipiZIDE (GLUCOTROL XL) 10 MG 24 hr tablet Take 10 mg by mouth daily.        . insulin glargine (LANTUS) 100 UNIT/ML injection Inject 80 Units into the skin as directed.       . metFORMIN (GLUCOPHAGE) 1000 MG tablet Take 1,000 mg by mouth 2 (two) times daily.        . metoprolol (TOPROL-XL) 200 MG 24 hr tablet Take 200 mg by mouth daily.        . Misc Natural Products (OSTEO BI-FLEX ADV TRIPLE ST) TABS Take 500 each by mouth 2 (two) times daily.        . Omega-3 Fatty Acids (FISH OIL) 1000 MG CAPS Take 1 capsule by mouth daily.        . pioglitazone (ACTOS) 30 MG tablet Take 30 mg by mouth daily.      . potassium chloride SA (K-DUR,KLOR-CON) 20 MEQ tablet Take 20 mEq by mouth daily.        . ramipril (ALTACE) 5 MG capsule Take 5 mg by mouth 2 (two) times daily.        . sitaGLIPtin (JANUVIA) 100 MG tablet Take 100 mg by mouth daily.       Marland Kitchen warfarin (COUMADIN) 5 MG tablet Take 5 mg by mouth as directed.          Past Medical History  Diagnosis Date  . DM (diabetes mellitus)   .  HLD (hyperlipidemia)   . HTN (hypertension)   . Rheumatic fever     as a child  . CAD (coronary artery disease)     Past Surgical History  Procedure Date  . Coronary artery bypass graft     lef tmammary artery to LAD coronary artery and reversed SVG to distal rt coronary artery   . 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 Dr. Servando Snare    ROS:  As stated in the HPI and negative for all other systems.  PHYSICAL EXAM BP 120/80  Pulse 63  Ht 6' (1.829 m)  Wt 258 lb (117.028 kg)  BMI 34.99 kg/m2 GENERAL:  Well appearing HEENT:  Pupils equal round and reactive, fundi not visualized, oral mucosa unremarkable NECK:  No jugular venous distention, waveform within normal limits, carotid upstroke brisk and symmetric, no bruits, no  thyromegaly LYMPHATICS:  No cervical, inguinal adenopathy LUNGS:  Clear to auscultation bilaterally BACK:  No CVA tenderness CHEST:  Well healed sternotomy scar. HEART:  PMI not displaced or sustained, mechanical S1,  S2 within normal limits, no S3 , no clicks, no rubs, no murmurs, irregular ABD:  Flat, positive bowel sounds normal in frequency in pitch, no bruits, no rebound, no guarding, no midline pulsatile mass, no hepatomegaly, no splenomegaly, obese EXT:  2 plus pulses throughout, no edema, no cyanosis no clubbing   EKG: Atrial fibrillation, rate 62 Right axis deviation, no acute ST-T wave changes. 03/05/2012   ASSESSMENT AND PLAN

## 2012-03-05 NOTE — Assessment & Plan Note (Signed)
The blood pressure is at target. No change in medications is indicated. We will continue with therapeutic lifestyle changes (TLC).  

## 2012-03-05 NOTE — Assessment & Plan Note (Signed)
The patient understands the need to lose weight with diet and exercise. We have discussed specific strategies for this.  

## 2012-03-05 NOTE — Patient Instructions (Addendum)
The current medical regimen is effective;  continue present plan and medications.  Follow up in 1 year with Dr Hochrein.  You will receive a letter in the mail 2 months before you are due.  Please call us when you receive this letter to schedule your follow up appointment.  

## 2012-03-05 NOTE — Assessment & Plan Note (Signed)
He had a low risk nuclear scan late last year. He has no symptoms. No change in therapy is indicated. Needs continued risk reduction.

## 2012-06-23 ENCOUNTER — Telehealth: Payer: Self-pay | Admitting: Cardiology

## 2012-06-23 NOTE — Telephone Encounter (Signed)
error 

## 2012-07-23 ENCOUNTER — Ambulatory Visit (INDEPENDENT_AMBULATORY_CARE_PROVIDER_SITE_OTHER): Payer: 59 | Admitting: Physician Assistant

## 2012-07-23 ENCOUNTER — Encounter: Payer: Self-pay | Admitting: Physician Assistant

## 2012-07-23 VITALS — BP 164/84 | HR 72 | Ht 72.0 in | Wt 264.0 lb

## 2012-07-23 DIAGNOSIS — R079 Chest pain, unspecified: Secondary | ICD-10-CM

## 2012-07-23 DIAGNOSIS — Z952 Presence of prosthetic heart valve: Secondary | ICD-10-CM

## 2012-07-23 DIAGNOSIS — R0602 Shortness of breath: Secondary | ICD-10-CM

## 2012-07-23 DIAGNOSIS — I2510 Atherosclerotic heart disease of native coronary artery without angina pectoris: Secondary | ICD-10-CM

## 2012-07-23 DIAGNOSIS — I428 Other cardiomyopathies: Secondary | ICD-10-CM

## 2012-07-23 DIAGNOSIS — I10 Essential (primary) hypertension: Secondary | ICD-10-CM

## 2012-07-23 DIAGNOSIS — I4891 Unspecified atrial fibrillation: Secondary | ICD-10-CM

## 2012-07-23 DIAGNOSIS — Z954 Presence of other heart-valve replacement: Secondary | ICD-10-CM

## 2012-07-23 MED ORDER — NITROGLYCERIN 0.4 MG SL SUBL: 0.4000 mg | SUBLINGUAL_TABLET | SUBLINGUAL | Status: DC | PRN

## 2012-07-23 MED ORDER — AMLODIPINE BESYLATE 5 MG PO TABS: 5.0000 mg | ORAL_TABLET | Freq: Every day | ORAL | Status: DC

## 2012-07-23 NOTE — Progress Notes (Signed)
[address] [place],   [address] Phone: [phone], Fax:  [phone]  Date:  07/23/2012   Name:  John [name]   DOB:  10-Dec-1950   MRN:  [id number]  PCP:  [name], MD  Primary Cardiologist:  Dr. Minus Breeding  Primary Electrophysiologist:  None    History of Present Illness: John [name] is a 61 y.o. male who returns for evaluation of exertional chest pain and dyspnea.  He has a hx of CAD and rheumatic heart disease, status post CABG and St. Jude mitral valve replacement in 3/02, DM2, HTN, HL, atrial fibrillation. Echo 5/11: Mild LVH, EF 45-50%, mild AS, mild AI, mean gradient 11 mmHg, MVR with normal gradients, moderate LAE, mild RAE. Lexiscan Myoview 11/12: EF 45%, ischemia and possibly some scar at the base of the inferolateral wall. Last seen by Dr. Percival Spanish 6/13. Myoview in 11/12 was felt to be low risk and medical therapy was continued.  Over the last month, he has noted dyspnea with mod to severe exertion with assoc left chest tightness.  No rest symptoms.  No syncope.  No orthopnea, PND, edema.  Symptoms do not seem to be getting worse.  He has not had chest pain like this before.  He notes similar dyspnea prior to his surgery in 2002.  Symptoms then were much worse.  Labs (1/13):   K 4.3, creatinine 1.10, Hgb 14.4  Wt Readings from Last 3 Encounters:  07/23/12 264 lb (119.75 kg)  03/05/12 258 lb (117.028 kg)  09/29/11 260 lb (117.935 kg)     Past Medical History  Diagnosis Date  . DM (diabetes mellitus)   . HLD (hyperlipidemia)   . HTN (hypertension)   . Rheumatic fever     as a child  . CAD (coronary artery disease)     a. s/p CABG 11/2000 (L-LAD, S-RCA);  b. Lexiscan Myoview 11/12: EF 45%, ischemia and possibly some scar at the base of the inferolateral wall  . Atrial fibrillation     coumadin managed by PCP  . Rheumatic heart disease     a. s/p mechanical (St. Jude) MVR 2002;  b. Echo 5/11: Mild LVH, EF  45-50%, mild AS, mild AI, mean gradient 11 mmHg, MVR with normal gradients, moderate LAE, mild RAE.   . S/P MVR (mitral valve replacement) 11/2000    Current Outpatient Prescriptions  Medication Sig Dispense Refill  . aspirin 81 MG tablet Take 81 mg by mouth daily.        . beta carotene w/minerals (OCUVITE) tablet Take 1 tablet by mouth daily.        . digoxin (LANOXIN) 0.25 MG tablet Take 125 mcg by mouth daily.       Marland Kitchen esomeprazole (NEXIUM) 40 MG capsule Take 40 mg by mouth daily before breakfast.      . ezetimibe-simvastatin (VYTORIN) 10-20 MG per tablet Take 1 tablet by mouth daily.        . furosemide (LASIX) 40 MG tablet Take 40 mg by mouth daily.       Marland Kitchen glipiZIDE (GLUCOTROL XL) 10 MG 24 hr tablet Take 10 mg by mouth daily.        . insulin glargine (LANTUS) 100 UNIT/ML injection Inject 80 Units into the skin as directed.       . metFORMIN (GLUCOPHAGE) 1000 MG tablet Take 1,000 mg by mouth 2 (two) times daily.        . metoprolol (TOPROL-XL) 200 MG 24 hr  tablet Take 200 mg by mouth daily.        . Misc Natural Products (OSTEO BI-FLEX ADV TRIPLE ST) TABS Take 500 each by mouth 2 (two) times daily.        . Omega-3 Fatty Acids (FISH OIL) 1000 MG CAPS Take 1 capsule by mouth daily.        . pioglitazone (ACTOS) 30 MG tablet Take 15 mg by mouth daily.       . potassium chloride SA (K-DUR,KLOR-CON) 20 MEQ tablet Take 20 mEq by mouth daily.        . ramipril (ALTACE) 5 MG capsule Take 5 mg by mouth 2 (two) times daily.        . sitaGLIPtin (JANUVIA) 100 MG tablet Take 100 mg by mouth daily.       Marland Kitchen warfarin (COUMADIN) 5 MG tablet Take 5 mg by mouth as directed.          Allergies:   No Known Allergies  Social History:  The patient  reports that he quit smoking about 13 years ago. His smoking use included Cigars. He does not have any smokeless tobacco history on file. He reports that he does not drink alcohol or use illicit drugs.   Family History:  His family history is negative for  Heart disease.   ROS:  Please see the history of present illness.   Notes some dark stools. No melena. No hematochezia. No fevers, cough, weight gain.     All other systems reviewed and negative.   PHYSICAL EXAM: VS:  BP 164/84  Pulse 72  Ht 6' (1.829 m)  Wt 264 lb (119.75 kg)  BMI 35.80 kg/m2 Well nourished, well developed, in no acute distress HEENT: normal Neck: no JVD Cardiac:  mechanical S1, normal S2; irregularly irregular rhythm; A999333 systolic murmur LSB Lungs:  clear to auscultation bilaterally, no wheezing, rhonchi or rales Abd: soft, nontender, no hepatomegaly Ext: no edema Skin: warm and dry Neuro:  CNs 2-12 intact, no focal abnormalities noted  EKG:  AFib, HR 72, NSSTTW changes      ASSESSMENT AND PLAN:  1. Exertional Chest Pain:   This is assoc with exertional dyspnea.  His stress test one year ago was abnormal, but low risk and medical Rx was continued.  He now has had a change in his symptoms.  He is probably describing CCS Class II symptoms. I reviewed his case today with Dr. Minus Breeding.  As his symptoms are all exertional and stable, we have decided to proceed with stress perfusion imaging initially to assess for high risk features.  I will arrange a Lexiscan Myoview.  If his scan is low risk, we will attempt to continue medical Rx to control his symptoms.  If his scan is high risk he will require cardiac cath.  He takes PDE-5 inhibitors.  I will start him on Norvasc 5 mg QD.  I will make sure he has a Rx for SL NTG.  Check a BMET and CBC.  Follow up with Dr. Minus Breeding or me in 2 weeks.  Of note, if he requires cardiac cath, he will need Lovenox bridge while off of coumadin.   2. Hypertension:   Uncontrolled.  Start Norvasc 5 mg QD as noted above.  3. Coronary Artery Disease:   Bypass grafts > 61 years old.  Proceed with myoview as noted.  4. Cardiomyopathy:   Volume stable.    5. S/p Mechanical MVR:  Last echo > 2 years ago.  He had mild AS at that time as  well.  Check follow up echo given his significant dyspnea.  If he requires cardiac cath, he will need Lovenox bridge while off of coumadin.   6. Atrial Fibrillation:   Rate controlled.  Coumadin managed by PCP.    Danton Sewer, PA-C  3:55 PM 07/23/2012

## 2012-07-23 NOTE — Patient Instructions (Addendum)
PLEASE SCHEDULE AN ECHO DX MITRAL VALVE REPLACEMENT  PLEASE SCHEDULE A LEXISCAN DX 786.50  PLEASE FOLLOW UP WITH SCOTT WEAVER, PAC SAME DAY DR. Percival Spanish IS IN THE OFFICE  BMET, CBC W.DIFF TODAY  START AMLODIPINE 5 MG DAILY; RX WAS SENT IN TO [facility] IN [place]

## 2012-07-24 ENCOUNTER — Telehealth: Payer: Self-pay | Admitting: *Deleted

## 2012-07-24 ENCOUNTER — Encounter: Payer: Self-pay | Admitting: *Deleted

## 2012-07-24 LAB — CBC WITH DIFFERENTIAL/PLATELET
Basophils Absolute: 0.1 10*3/uL (ref 0.0–0.1)
Basophils Relative: 0.8 % (ref 0.0–3.0)
Eosinophils Absolute: 0.2 10*3/uL (ref 0.0–0.7)
Eosinophils Relative: 3.2 % (ref 0.0–5.0)
HCT: 33.6 % — ABNORMAL LOW (ref 39.0–52.0)
Hemoglobin: 10.9 g/dL — ABNORMAL LOW (ref 13.0–17.0)
Lymphocytes Relative: 16.4 % (ref 12.0–46.0)
Lymphs Abs: 1.3 10*3/uL (ref 0.7–4.0)
MCHC: 32.3 g/dL (ref 30.0–36.0)
MCV: 86.9 fl (ref 78.0–100.0)
Monocytes Absolute: 0.8 10*3/uL (ref 0.1–1.0)
Monocytes Relative: 10.8 % (ref 3.0–12.0)
Neutro Abs: 5.3 10*3/uL (ref 1.4–7.7)
Neutrophils Relative %: 68.8 % (ref 43.0–77.0)
Platelets: 271 10*3/uL (ref 150.0–400.0)
RBC: 3.87 Mil/uL — ABNORMAL LOW (ref 4.22–5.81)
RDW: 14.2 % (ref 11.5–14.6)
WBC: 7.7 10*3/uL (ref 4.5–10.5)

## 2012-07-24 LAB — BASIC METABOLIC PANEL
BUN: 27 mg/dL — ABNORMAL HIGH (ref 6–23)
CO2: 26 mEq/L (ref 19–32)
Calcium: 8.8 mg/dL (ref 8.4–10.5)
Chloride: 104 mEq/L (ref 96–112)
Creatinine, Ser: 1.3 mg/dL (ref 0.4–1.5)
GFR: 61.63 mL/min (ref >60.00)
Glucose, Bld: 238 mg/dL — ABNORMAL HIGH (ref 70–99)
Potassium: 5.1 mEq/L (ref 3.5–5.1)
Sodium: 137 mEq/L (ref 135–145)

## 2012-07-24 NOTE — Telephone Encounter (Signed)
Message copied by Michae Kava on Thu Jul 24, 2012  4:55 PM ------      Message from: [place], [place] T      Created: Thu Jul 24, 2012  4:40 PM       K+ normal but on high end of range.      Decrease K+ to 10 mEq daily.            He is anemic.        Hgb has dropped from 14.4 to 10.9 in 9 mos.      He needs follow up with his PCP to further evaluate.  This should be done in the next 1-2 weeks.      Please fax copy of labs to [name], MD and ask his office to arrange follow up.      Richardson Dopp, PA-C  4:39 PM 07/24/2012

## 2012-07-24 NOTE — Telephone Encounter (Signed)
this is not pt's phone #, woman there states there is no person there by this pt's name. I will send out a results ltr today to the pt

## 2012-07-25 ENCOUNTER — Ambulatory Visit (HOSPITAL_COMMUNITY): Payer: No Typology Code available for payment source | Attending: Cardiovascular Disease

## 2012-07-25 DIAGNOSIS — Z87891 Personal history of nicotine dependence: Secondary | ICD-10-CM | POA: Insufficient documentation

## 2012-07-25 DIAGNOSIS — R0609 Other forms of dyspnea: Secondary | ICD-10-CM | POA: Insufficient documentation

## 2012-07-25 DIAGNOSIS — I2510 Atherosclerotic heart disease of native coronary artery without angina pectoris: Secondary | ICD-10-CM | POA: Insufficient documentation

## 2012-07-25 DIAGNOSIS — I059 Rheumatic mitral valve disease, unspecified: Secondary | ICD-10-CM

## 2012-07-25 DIAGNOSIS — R0989 Other specified symptoms and signs involving the circulatory and respiratory systems: Secondary | ICD-10-CM | POA: Insufficient documentation

## 2012-07-25 DIAGNOSIS — E785 Hyperlipidemia, unspecified: Secondary | ICD-10-CM | POA: Insufficient documentation

## 2012-07-25 DIAGNOSIS — E119 Type 2 diabetes mellitus without complications: Secondary | ICD-10-CM | POA: Insufficient documentation

## 2012-07-25 DIAGNOSIS — Z952 Presence of prosthetic heart valve: Secondary | ICD-10-CM

## 2012-07-25 DIAGNOSIS — I359 Nonrheumatic aortic valve disorder, unspecified: Secondary | ICD-10-CM | POA: Insufficient documentation

## 2012-07-25 DIAGNOSIS — I517 Cardiomegaly: Secondary | ICD-10-CM | POA: Insufficient documentation

## 2012-07-25 DIAGNOSIS — I4891 Unspecified atrial fibrillation: Secondary | ICD-10-CM | POA: Insufficient documentation

## 2012-07-25 DIAGNOSIS — I10 Essential (primary) hypertension: Secondary | ICD-10-CM | POA: Insufficient documentation

## 2012-07-25 DIAGNOSIS — Z954 Presence of other heart-valve replacement: Secondary | ICD-10-CM | POA: Insufficient documentation

## 2012-07-25 DIAGNOSIS — R079 Chest pain, unspecified: Secondary | ICD-10-CM | POA: Insufficient documentation

## 2012-07-25 NOTE — Progress Notes (Signed)
Echocardiogram performed.  

## 2012-07-28 ENCOUNTER — Encounter: Payer: Self-pay | Admitting: Physician Assistant

## 2012-07-28 ENCOUNTER — Telehealth: Payer: Self-pay | Admitting: *Deleted

## 2012-07-28 NOTE — Telephone Encounter (Signed)
disconnected

## 2012-07-28 NOTE — Telephone Encounter (Signed)
Message copied by Michae Kava on Mon Jul 28, 2012  9:44 AM ------      Message from: [place], [place] T      Created: Mon Jul 28, 2012  8:22 AM       Echo is ok.      EF is normal      Mild Aortic Stenosis - no significant change since last study      Richardson Dopp, PA-C  8:22 AM 07/28/2012

## 2012-07-31 ENCOUNTER — Ambulatory Visit (HOSPITAL_COMMUNITY): Payer: 59 | Attending: Internal Medicine | Admitting: Radiology

## 2012-07-31 VITALS — BP 142/60 | HR 69 | Ht 72.0 in | Wt 256.0 lb

## 2012-07-31 DIAGNOSIS — R079 Chest pain, unspecified: Secondary | ICD-10-CM

## 2012-07-31 DIAGNOSIS — R0602 Shortness of breath: Secondary | ICD-10-CM

## 2012-07-31 DIAGNOSIS — I4891 Unspecified atrial fibrillation: Secondary | ICD-10-CM

## 2012-07-31 DIAGNOSIS — I2510 Atherosclerotic heart disease of native coronary artery without angina pectoris: Secondary | ICD-10-CM

## 2012-07-31 MED ORDER — REGADENOSON 0.4 MG/5ML IV SOLN
0.4000 mg | Freq: Once | INTRAVENOUS | Status: AC
  Administered 2012-07-31: 0.4 mg via INTRAVENOUS

## 2012-07-31 MED ORDER — TECHNETIUM TC 99M SESTAMIBI GENERIC - CARDIOLITE
30.1000 | Freq: Once | INTRAVENOUS | Status: AC | PRN
  Administered 2012-07-31: 30.1 via INTRAVENOUS

## 2012-07-31 MED ORDER — TECHNETIUM TC 99M SESTAMIBI GENERIC - CARDIOLITE
9.5000 | Freq: Once | INTRAVENOUS | Status: AC | PRN
  Administered 2012-07-31: 10 via INTRAVENOUS

## 2012-07-31 NOTE — Progress Notes (Signed)
[facility] [id number] [place] [address] [phone]  Cardiology Nuclear Med Study  John [name] is a 61 y.o. male     MRN : [id number]     DOB: [birth year]  Procedure Date: 07/31/2012  Nuclear Med Background Indication for Stress Test:  Evaluation for Ischemia and Graft Patency History:  '02 CABG with MVR; '12 [id number] ischemia and possisble scar, EF=45%; 07/25/12 Echo:EF=60%, mild AS Cardiac Risk Factors: Hypertension, IDDM Type 2, Lipids, Obesity and Smoker  Symptoms:  Chest Pain/Tightness with and without Exertion (last episode of chest discomfort was yesterday), DOE, Fatigue and Rapid HR   Nuclear Pre-Procedure Caffeine/Decaff Intake:  None > 12 hrs NPO After: 7:30pm   Lungs:  Clear. O2 Sat: 98% on room air. IV 0.9% NS with Angio Cath:  22g  IV Site: R Forearm x 1, tolerated well IV Started by:  Irven Baltimore, RN  Chest Size (in):  44 Cup Size: n/a  Height: 6' (1.829 m)  Weight:  256 lb (116.121 kg)  BMI:  Body mass index is 34.72 kg/(m^2). Tech Comments:  1/2 dose lantus insulin last night, no insulin today.FBS was 84 at 6:00 am, juice given    Nuclear Med Study 1 or 2 day study: 1 day  Stress Test Type:  Treadmill/Lexiscan  Reading MD: Dorris Carnes, MD  Order Authorizing Provider:  Minus Breeding, MD, and Richardson Dopp, PA-C  Resting Radionuclide: Technetium 7m Sestamibi  Resting Radionuclide Dose: 9.5 mCi   Stress Radionuclide:  Technetium 48m Sestamibi  Stress Radionuclide Dose: 30.1 mCi           Stress Protocol Rest HR: 69 Stress HR: 80  Rest BP: 142/60 Stress BP: 117  Exercise Time (min): 2:00 METS: n/a   Predicted Max HR: 161 bpm % Max HR: 49.69 bpm Rate Pressure Product: 11360   Dose of Adenosine (mg):  n/a Dose of Lexiscan: 0.4 mg  Dose of Atropine (mg): n/a Dose of Dobutamine: n/a mcg/kg/min (at max HR)  Stress Test Technologist: Letta Moynahan, CMA-N  Nuclear Technologist:   Charlton Amor, CNMT     Rest Procedure:  Myocardial perfusion imaging was performed at rest 45 minutes following the intravenous administration of Technetium 78m Sestamibi.  Rest ECG: Atrial Fibrillation 58 bpm.  Stress Procedure:  The patient received IV Lexiscan 0.4 mg over 15-seconds with concurrent low level exercise and then Technetium 23m Sestamibi was injected at 30-seconds while the patient continued walking one more minute. There were no significant changes with Lexiscan.  He did c/o chest tightness, 5-6/10.  Quantitative spect images were obtained after a 45-minute delay.  Stress ECG: No significant change from baseline ECG  QPS Raw Data Images:  Soft tissue (diaphragm, bowel, subcutaneous fat) surround heart. Stress Images:  Defect in the inferolateral (base, mid); inferior (base, mid) walls  Otherwse normal perfusion.   Rest Images:  Improvemtn in the mid inferior/inferolateral region.   Subtraction (SDS):  These findings are consistent with ischemia. Transient Ischemic Dilatation (Normal <1.22):  1.11 Lung/Heart Ratio (Normal <0.45):  0.41  Quantitative Gated Spect Images QGS EDV:  219 ml QGS ESV:  123 ml  Impression Exercise Capacity:  Lexiscan with low level exercise. BP Response:  Normal blood pressure response. Clinical Symptoms:  Mild chest pain/dyspnea. ECG Impression:  No significant ST segment change suggestive of ischemia. Comparison with Prior Nuclear Study:  Appears to be unchanged from previous report.    Overall Impression:  Inferior/inferolateral scar/soft tissue attenuation  with some inferior ischemia (mid).  Overall; region ischemia is small, mild in severity.    LV Ejection Fraction: 44%.  LV Wall Motion:  Infero/inferoseptal hypokinensis.  Dorris Carnes

## 2012-08-01 ENCOUNTER — Encounter: Payer: Self-pay | Admitting: Physician Assistant

## 2012-08-01 ENCOUNTER — Telehealth: Payer: Self-pay | Admitting: *Deleted

## 2012-08-01 NOTE — Telephone Encounter (Signed)
Message copied by Michae Kava on Fri Aug 01, 2012  3:53 PM ------      Message from: [place], [place] T      Created: Fri Aug 01, 2012  1:34 PM       Stress test findings similar to those on last scan.      Keep follow up to discuss further      Richardson Dopp, PA-C  1:34 PM 08/01/2012

## 2012-08-01 NOTE — Telephone Encounter (Signed)
lmptcb about stress test results, if pt does not cb today he does have a f/u w/SW, PA 11/25 @ 8:50 and he will go over rersults then

## 2012-08-04 ENCOUNTER — Ambulatory Visit (INDEPENDENT_AMBULATORY_CARE_PROVIDER_SITE_OTHER): Payer: 59 | Admitting: Physician Assistant

## 2012-08-04 VITALS — BP 128/68 | HR 60 | Ht 72.0 in | Wt 265.0 lb

## 2012-08-04 DIAGNOSIS — D619 Aplastic anemia, unspecified: Secondary | ICD-10-CM

## 2012-08-04 DIAGNOSIS — D649 Anemia, unspecified: Secondary | ICD-10-CM | POA: Insufficient documentation

## 2012-08-04 DIAGNOSIS — R0602 Shortness of breath: Secondary | ICD-10-CM

## 2012-08-04 DIAGNOSIS — I4891 Unspecified atrial fibrillation: Secondary | ICD-10-CM

## 2012-08-04 DIAGNOSIS — I2510 Atherosclerotic heart disease of native coronary artery without angina pectoris: Secondary | ICD-10-CM

## 2012-08-04 LAB — CBC WITH DIFFERENTIAL/PLATELET
Basophils Absolute: 0.1 10*3/uL (ref 0.0–0.1)
Basophils Relative: 0.7 % (ref 0.0–3.0)
Eosinophils Absolute: 0.2 10*3/uL (ref 0.0–0.7)
Eosinophils Relative: 2.3 % (ref 0.0–5.0)
HCT: 31.2 % — ABNORMAL LOW (ref 39.0–52.0)
Hemoglobin: 9.9 g/dL — ABNORMAL LOW (ref 13.0–17.0)
Lymphocytes Relative: 12.8 % (ref 12.0–46.0)
Lymphs Abs: 1 10*3/uL (ref 0.7–4.0)
MCHC: 31.9 g/dL (ref 30.0–36.0)
MCV: 84.9 fl (ref 78.0–100.0)
Monocytes Absolute: 0.8 10*3/uL (ref 0.1–1.0)
Monocytes Relative: 10.4 % (ref 3.0–12.0)
Neutro Abs: 5.9 10*3/uL (ref 1.4–7.7)
Neutrophils Relative %: 73.8 % (ref 43.0–77.0)
Platelets: 276 10*3/uL (ref 150.0–400.0)
RBC: 3.67 Mil/uL — ABNORMAL LOW (ref 4.22–5.81)
RDW: 14.6 % (ref 11.5–14.6)
WBC: 8 10*3/uL (ref 4.5–10.5)

## 2012-08-04 LAB — BRAIN NATRIURETIC PEPTIDE: Pro B Natriuretic peptide (BNP): 120 pg/mL — ABNORMAL HIGH (ref 0.0–100.0)

## 2012-08-04 LAB — FOLATE: Folate: 9.9 ng/mL (ref >5.9)

## 2012-08-04 LAB — VITAMIN B12: Vitamin B-12: 356 pg/mL (ref 211–911)

## 2012-08-04 LAB — FERRITIN: Ferritin: 9 ng/mL — ABNORMAL LOW (ref 22.0–322.0)

## 2012-08-04 NOTE — Progress Notes (Signed)
[address] [place], [place]  [address] Phone: [phone], Fax:  [phone]  Date:  08/04/2012   Name:  John [name]   DOB:  Nov 24, 1950   MRN:  [id number]  PCP:  [name], MD  Primary Cardiologist:  Dr. Minus Breeding  Primary Electrophysiologist:  None    History of Present Illness: John [name] is a 61 y.o. male who returns for f/u on chest pain and dyspnea.  He has a hx of CAD and rheumatic heart disease, s/p CABG and St. Jude MVR in 3/02, DM2, HTN, HL, atrial fibrillation. Echo 5/11: Mild LVH, EF 45-50%, mild AS, mild AI, mean gradient 11 mmHg, MVR with normal gradients, moderate LAE, mild RAE. Lexiscan Myoview 11/12: EF 45%, ischemia and possibly some scar at the base of the inferolateral wall. Last seen by Dr. Percival Spanish 6/13. Myoview in 11/12 was felt to be low risk and medical therapy was continued.  I saw him 11/13 with complaints of dyspnea and chest tightness with exertion. I placed him on Norvasc 5 mg daily. Followup echo demonstrated EF 55-60% with mild AS, PASP 41.  Myoview 07/31/12: Inferior/inferolateral scar/soft tissue attenuation with some inferior ischemia (small, mild in severity), EF 44%.  Labs indicated worsening Hgb and he has been asked to see his PCP.  Today, he states he is feeling better.  Notes breathing is better.  Has more exercise tolerance.  Still has exertional left sided chest tightness with his dyspnea.  This seems to resolve quicker.  No rest pain.  No syncope.  No orthopnea, PND, edema.    Labs (1/13):   K 4.3, creatinine 1.10, Hgb 14.4 Labs (11/13): K 5.1, creatinine 1.3, Hgb 10.9  Wt Readings from Last 3 Encounters:  07/31/12 256 lb (116.121 kg)  07/23/12 264 lb (119.75 kg)  03/05/12 258 lb (117.028 kg)     Past Medical History  Diagnosis Date  . DM (diabetes mellitus)   . HLD (hyperlipidemia)   . HTN (hypertension)   . Rheumatic fever     as a child  . CAD (coronary artery disease)     a.  s/p CABG 11/2000 (L-LAD, S-RCA);  b. Lexiscan Myoview 11/12: EF 45%, ischemia and possibly some scar at the base of the inferolateral wall;   c. Lex MV 11/13:  EF 44%, Inf and IL scar/soft tissue atten, small mild amt of inf ischemia,  . Atrial fibrillation     coumadin managed by PCP  . Rheumatic heart disease     a. s/p mechanical (St. Jude) MVR 2002;  b. Echo 5/11: Mild LVH, EF 45-50%, mild AS, mild AI, mean gradient 11 mmHg, MVR with normal gradients, moderate LAE, mild RAE;  c.  Echo 11/13:   mod LVH, EF 55-60%, mild AS (mean 18 mmHg), mild AI, severe LAE, mild RAE, PASP 41  . S/P MVR (mitral valve replacement) 11/2000    Current Outpatient Prescriptions  Medication Sig Dispense Refill  . amLODipine (NORVASC) 5 MG tablet Take 1 tablet (5 mg total) by mouth daily.  30 tablet  11  . aspirin 81 MG tablet Take 81 mg by mouth daily.        . beta carotene w/minerals (OCUVITE) tablet Take 1 tablet by mouth daily.        . digoxin (LANOXIN) 0.25 MG tablet Take 125 mcg by mouth daily.       Marland Kitchen esomeprazole (NEXIUM) 40 MG capsule Take 40 mg by mouth daily before breakfast.      .  ezetimibe-simvastatin (VYTORIN) 10-20 MG per tablet Take 1 tablet by mouth daily.        . furosemide (LASIX) 40 MG tablet Take 40 mg by mouth daily.       Marland Kitchen glipiZIDE (GLUCOTROL XL) 10 MG 24 hr tablet Take 10 mg by mouth daily.        . insulin glargine (LANTUS) 100 UNIT/ML injection Inject 80 Units into the skin as directed.       . metFORMIN (GLUCOPHAGE) 1000 MG tablet Take 1,000 mg by mouth 2 (two) times daily.        . metoprolol (TOPROL-XL) 200 MG 24 hr tablet Take 200 mg by mouth daily.        . Misc Natural Products (OSTEO BI-FLEX ADV TRIPLE ST) TABS Take 500 each by mouth 2 (two) times daily.        . nitroGLYCERIN (NITROSTAT) 0.4 MG SL tablet Place 1 tablet (0.4 mg total) under the tongue every 5 (five) minutes as needed for chest pain.  25 tablet  3  . Omega-3 Fatty Acids (FISH OIL) 1000 MG CAPS Take 1 capsule by  mouth daily.        . pioglitazone (ACTOS) 30 MG tablet Take 15 mg by mouth daily.       . potassium chloride SA (K-DUR,KLOR-CON) 20 MEQ tablet Take 20 mEq by mouth daily.        . ramipril (ALTACE) 5 MG capsule Take 5 mg by mouth 2 (two) times daily.        . sildenafil (VIAGRA) 100 MG tablet Take 100 mg by mouth as needed.      . sitaGLIPtin (JANUVIA) 100 MG tablet Take 100 mg by mouth daily.       . Testosterone (ANDROGEL PUMP) 12.5 MG/ACT (1%) GEL Place onto the skin daily.      Marland Kitchen warfarin (COUMADIN) 5 MG tablet Take 5 mg by mouth as directed.          Allergies:   No Known Allergies  Social History:  The patient  reports that he quit smoking about 13 years ago. His smoking use included Cigars. He does not have any smokeless tobacco history on file. He reports that he does not drink alcohol or use illicit drugs.   ROS:  Please see the history of present illness.   No melena or hematochezia.   All other systems reviewed and negative.   PHYSICAL EXAM: VS:  BP 128/68  Pulse 60  Ht 6' (1.829 m)  Wt 265 lb (120.203 kg)  BMI 35.94 kg/m2  SpO2 97% Well nourished, well developed, in no acute distress HEENT: normal Neck: no JVD Cardiac:  mechanical S1, normal S2; irregularly irregular rhythm; A999333 systolic murmur LSB Lungs:  clear to auscultation bilaterally, no wheezing, rhonchi or rales Abd: soft, nontender, no hepatomegaly Ext: no edema Skin: warm and dry Neuro:  CNs 2-12 intact, no focal abnormalities noted   ASSESSMENT AND PLAN:  1. Exertional Chest Pain:   Improved.  Recent myoview fairly low risk and no significant change from 2012.  D/w Dr. Minus Breeding.  Given his normal EF on echo and low risk, nuclear scan, will continue medical Rx for now.  Will go ahead and check a BNP today.  He is anemic.  His Hgb is much lower than prior.  This needs evaluation.  Question if anemia contributing to symptoms.  He would need any potential bleeding addressed before we could consider  cardiac cath as he would need  dual antiplatelet Rx + coumadin (at least for the short term) should PCI be necessary.  As noted, since his nuclear study is low risk and his symptoms are stable, continue medical Rx and proceed with evaluation of anemia.  Follow up with Dr. Minus Breeding in 4-6 weeks in [place].   2. Hypertension:   Better controlled.   3. Coronary Artery Disease:   As noted, low risk myoview.  Continue medical Rx.  He knows to return sooner if symptoms change.  4. Cardiomyopathy:   EF actually normal on Echo.  Continue current Rx.    5. S/p Mechanical MVR:  Ok by echo.  6. Atrial Fibrillation:   He remains on coumadin.   7. Anemia:   MCV is normal.  He did have dark stools recently, but this has cleared.  I will repeat his CBC today along with Iron, TIBC, Ferritin, B12, Folate.  He will arrange early follow up with his PCP.  I will refer him to GI.  He notes that Dr. Amedeo Plenty comes to [facility] and we will try to get him referred to Dr. Teena Irani.   Signed, Richardson Dopp, PA-C  8:45 AM 08/04/2012

## 2012-08-04 NOTE — Patient Instructions (Addendum)
Your physician recommends that you return for lab work in: today  Your provider recommends that you see a Copywriter, advertising, we will arrange  Please see your primary care doctor concerning your anemia  Your physician recommends that you schedule a follow-up appointment in: 4 to 6 weeks in [place] office.

## 2012-08-05 ENCOUNTER — Other Ambulatory Visit: Payer: Self-pay | Admitting: *Deleted

## 2012-08-05 ENCOUNTER — Telehealth: Payer: Self-pay | Admitting: Physician Assistant

## 2012-08-05 ENCOUNTER — Other Ambulatory Visit (INDEPENDENT_AMBULATORY_CARE_PROVIDER_SITE_OTHER): Payer: 59

## 2012-08-05 DIAGNOSIS — D649 Anemia, unspecified: Secondary | ICD-10-CM

## 2012-08-05 LAB — IBC PANEL
Iron: 22 ug/dL — ABNORMAL LOW (ref 42–165)
Saturation Ratios: 5 % — ABNORMAL LOW (ref 20.0–50.0)
Transferrin: 311.4 mg/dL (ref 212.0–360.0)

## 2012-08-05 MED ORDER — FERROUS SULFATE 325 (65 FE) MG PO TABS: 325.0000 mg | ORAL_TABLET | Freq: Three times a day (TID) | ORAL | Status: DC

## 2012-08-05 NOTE — Telephone Encounter (Signed)
lmptcb to see where the fax # [phone] is going to; that I cannot send fax to an unsecured fax # however if this is to the dr. office that will be fine

## 2012-08-05 NOTE — Telephone Encounter (Signed)
s/w pt's wife today about lab results. pt to start FE 325 mg TID, get stool cards from Dr. Edrick Oh office w/CBC nxt week w/Dr. Edrick Oh office per conversation Brynda Rim. PAC had w/Dr. Edrick Oh today, wife verbalized understanding

## 2012-08-05 NOTE — Telephone Encounter (Signed)
Can you fax labs to pt's wife he needs them for another appt today fax [phone]

## 2012-08-05 NOTE — Telephone Encounter (Signed)
Message copied by Michae Kava on Tue Aug 05, 2012  4:32 PM ------      Message from: [place], [place] T      Created: Tue Aug 05, 2012  9:58 AM       BNP minimally elevated.  Likely related to chronic AFib.  No need to adjust his Lasix at this point.      Hgb is worse.  Ferritin is low.      Iron, TIBC still pending.      B12 and Folate are ok.      He has an appt with Dr. Teena Irani in [facility] 08/19/2012.      I have spoken to Dr. Edrick Oh who will contact him to get stool cards obtained and repeat CBC next week.      Have Lenwood L Schoenfeld start taking Ferrous Sulfate 325 mg TID (can get OTC) until he is seen by Dr. Edrick Oh and Dr. Amedeo Plenty.      Please fax copy of labs to Dr. Edrick Oh.      Richardson Dopp, PA-C  9:58 AM 08/05/2012

## 2012-08-05 NOTE — Telephone Encounter (Signed)
lmptcb to see where the fax # [phone] is going to; that I cannot send fax to an unsecured fax # however if this is to the dr. office that will be fine

## 2012-08-06 NOTE — Telephone Encounter (Signed)
lmom sending labs over now to [phone] to [facility]

## 2012-08-11 ENCOUNTER — Telehealth: Payer: Self-pay | Admitting: Cardiology

## 2012-08-11 NOTE — Telephone Encounter (Signed)
New Problem:    Patient's wife called concerned because her husband was referred to a GI office to be evaluated to have a colonoscopy due to loss of blood.  Patient's wife personally requested that the patient be referred to Dr. Amedeo Plenty upon check out and was told that he would be able to be scheduled to see someone sooner if he desired.  Now her husband is concerned that he would have enough time to have a colonoscopy between seeing Dr. Amedeo Plenty and his next appointment to see Dr. Warren Lacy.  Per patient's wife, does not wish to reschedule appointment with Dr. Percival Spanish but did not call Dr. Doristine Locks office to attempt to be scheduled with another provider sooner.  Please call back.

## 2012-08-11 NOTE — Telephone Encounter (Signed)
I s/w pt's wife John [name] about her message from earlier today if ok to see someone else @ could see pt sooner in GI. she states pt is nervous that he will not have time enough for colonoscopy and Hochrein appt. I advised to call GI for sooner appt

## 2012-08-11 NOTE — Telephone Encounter (Signed)
I s/w pt's wife Marcie Bal about her message from earlier today if ok to see someone else @ could see pt sooner in GI. she states pt is nervous that he will not have time enough for colonoscopy and Hochrein appt. I advised to call GI for sooner appt

## 2012-08-13 ENCOUNTER — Telehealth: Payer: Self-pay | Admitting: Cardiology

## 2012-08-13 NOTE — Telephone Encounter (Signed)
Will forward to MD for review and recommendations

## 2012-08-13 NOTE — Telephone Encounter (Signed)
New problem:   Need to stop coumadin 5 days prior to upcoming procedure on  12/23. - colonoscopy

## 2012-08-15 NOTE — Telephone Encounter (Signed)
Left message for John [name] that pt does need to be bridged and can not stop the coumadin without being bridged   Left message for her to call back with any questions

## 2012-08-15 NOTE — Telephone Encounter (Signed)
The patient has a mechanical mitral valve he will need to have bridging Lovenox when coming off of the warfarin.  He has his warfarin followed by [facility].

## 2012-08-15 NOTE — Telephone Encounter (Signed)
s/w pt's wife Marcie Bal about the IBC lab results to fax over today. wife asked about coumadin stopped 5 days b4 colonoscopy which is 09/01/12. D/W Brynda Rim. PA who said that Dr. Percival Spanish needs to address coumadin. Looks like Rexene Agent RN has sent this to MD for review. I will forward this message as an John [name]; wife said thank you

## 2012-08-15 NOTE — Telephone Encounter (Signed)
Follow-up:     Patient's wife called in again needing a copy of the patient's labs from last week when he saw scott to receive prior auth for his procedure.  Please call of fax back [phone].

## 2012-08-15 NOTE — Telephone Encounter (Signed)
s/w pt's wife Marcie Bal about the IBC lab results to fax over today. wife asked about coumadin stopped 5 days b4 colonoscopy. D/W Brynda Rim. PA who said that Dr. Percival Spanish needs to address coumadin

## 2012-08-18 ENCOUNTER — Telehealth: Payer: Self-pay | Admitting: *Deleted

## 2012-08-18 ENCOUNTER — Telehealth: Payer: Self-pay | Admitting: Cardiology

## 2012-08-18 NOTE — Telephone Encounter (Signed)
Per Alyse Low at [facility] office [phone]  Pt having to have a colonoscopy and needs bridging with Lovenox per Dr Percival Spanish.  Dr Murrell Redden office is needing to know how to dose it.  Will forward to Porfirio Oar and she will contact his office. (mitral valve replacement and At Fib)

## 2012-08-18 NOTE — Telephone Encounter (Signed)
Pt's colonoscopy scheduled for 12/23

## 2012-08-18 NOTE — Telephone Encounter (Signed)
Reviewed with wife that pt needs to be bridged to come off Coumadin.  They should contact Dr Murrell Redden office since they follow his INR's.  She states understanding.

## 2012-08-18 NOTE — Telephone Encounter (Signed)
Pt rtn call to pam

## 2012-08-18 NOTE — Telephone Encounter (Signed)
Pt's weight- 120kg, SCr- 1.3.  CrCl- 80 mL/min.  Will dose Lovenox at 120mg  BID   Suggest the following dosing schedule:   12/18- Last dose of Coumadin 12/19- No Coumadin or Lovenox 12/20- Lovenox 120mg  in AM and PM 12/21- Lovenox 120mg  in AM and PM 12/22- Lovenox 120mg  in AM 12/23- Day of Procedure.  When okay with MD, restart Coumadin AND Lovenox.  Continue Lovenox 120mg  every 12 hours.  Take an extra 1/2 tablet of Coumadin for the first 2 days then restart previous dose.  12/27 or 12/30- Follow up INR with Dr. Murrell Redden office for INR.  Continue Lovenox until INR>2.0  Faxed instructions to [facility] at Dr. Murrell Redden office- [phone]

## 2012-08-27 ENCOUNTER — Ambulatory Visit: Payer: 59 | Admitting: Cardiology

## 2012-09-01 ENCOUNTER — Other Ambulatory Visit: Payer: Self-pay | Admitting: Gastroenterology

## 2012-09-10 DIAGNOSIS — C179 Malignant neoplasm of small intestine, unspecified: Secondary | ICD-10-CM

## 2012-09-10 HISTORY — DX: Malignant neoplasm of small intestine, unspecified: C17.9

## 2012-09-10 HISTORY — PX: APPLICATION OF WOUND VAC: SHX5189

## 2012-09-17 ENCOUNTER — Ambulatory Visit: Payer: 59 | Admitting: Cardiology

## 2012-09-24 ENCOUNTER — Ambulatory Visit: Payer: 59 | Admitting: Cardiology

## 2012-10-16 ENCOUNTER — Telehealth: Payer: Self-pay | Admitting: Cardiology

## 2012-10-16 NOTE — Telephone Encounter (Signed)
Spoke with wife who states pt's SOB is about the same as it was.  It hasn't really increased or changed.  He does not want to go to the Dr office right now for more blood work as Dr Percival Spanish ordered for him to have a BNP however he will have it at his next blood draw.  An order will be faxed to Dr Murrell Redden office.  Marcie Bal will call back if s/s change or get worse.  He will continue FE supplements as ordered.  He will follow up with lab work in 1 month.

## 2012-10-16 NOTE — Telephone Encounter (Signed)
Wife wants to talk about the lab work he got done yesterday she is faxing it right now and his blood level is too low and his Sats are at 7 and it should be 20

## 2012-12-31 ENCOUNTER — Other Ambulatory Visit: Payer: Self-pay | Admitting: Gastroenterology

## 2012-12-31 DIAGNOSIS — R195 Other fecal abnormalities: Secondary | ICD-10-CM

## 2012-12-31 DIAGNOSIS — D509 Iron deficiency anemia, unspecified: Secondary | ICD-10-CM

## 2013-01-05 ENCOUNTER — Ambulatory Visit
Admission: RE | Admit: 2013-01-05 | Discharge: 2013-01-05 | Disposition: A | Discharge status: Home / Self Care | Payer: BC Managed Care – PPO | Source: Ambulatory Visit | Attending: Gastroenterology | Admitting: Gastroenterology

## 2013-01-05 DIAGNOSIS — D509 Iron deficiency anemia, unspecified: Secondary | ICD-10-CM

## 2013-01-05 DIAGNOSIS — R195 Other fecal abnormalities: Secondary | ICD-10-CM

## 2013-01-05 MED ORDER — IOHEXOL 300 MG/ML  SOLN
125.0000 mL | Freq: Once | INTRAMUSCULAR | Status: AC | PRN
  Administered 2013-01-05: 125 mL via INTRAVENOUS

## 2013-01-06 ENCOUNTER — Encounter (INDEPENDENT_AMBULATORY_CARE_PROVIDER_SITE_OTHER): Payer: Self-pay | Admitting: Surgery

## 2013-01-16 ENCOUNTER — Encounter (INDEPENDENT_AMBULATORY_CARE_PROVIDER_SITE_OTHER): Payer: Self-pay

## 2013-01-16 ENCOUNTER — Encounter (INDEPENDENT_AMBULATORY_CARE_PROVIDER_SITE_OTHER): Payer: Self-pay | Admitting: Surgery

## 2013-01-16 ENCOUNTER — Ambulatory Visit (INDEPENDENT_AMBULATORY_CARE_PROVIDER_SITE_OTHER): Payer: BC Managed Care – PPO | Admitting: Surgery

## 2013-01-16 VITALS — BP 130/66 | HR 78 | Temp 98.6°F | Resp 18 | Ht 72.0 in | Wt 249.6 lb

## 2013-01-16 DIAGNOSIS — R1900 Intra-abdominal and pelvic swelling, mass and lump, unspecified site: Secondary | ICD-10-CM | POA: Insufficient documentation

## 2013-01-16 NOTE — Progress Notes (Signed)
Patient ID: John [name], male   DOB: 1951/08/10, 62 y.o.   MRN: [id number]  Chief Complaint  Patient presents with  . Other    small bowel mass    HPI John [name] is a 62 y.o. male.   HPI This gentleman is referred by Dr. Paulita Fujita for evaluation of an intra-abdominal mass and anemia. He's been having issues with anemia for last 6 months. He is currently on Coumadin. He has some slight abdominal pain but no nausea or vomiting or obstructive symptoms. There is no gross blood in his bowel movements. He denies fevers or night sweats. He does have fatigue and weakness. He reports a 12 pound weight loss Past Medical History  Diagnosis Date  . DM (diabetes mellitus)   . HLD (hyperlipidemia)   . HTN (hypertension)   . Rheumatic fever     as a child  . CAD (coronary artery disease)     a. s/p CABG 11/2000 (L-LAD, S-RCA);  b. Lexiscan Myoview 11/12: EF 45%, ischemia and possibly some scar at the base of the inferolateral wall;   c. Lex MV 11/13:  EF 44%, Inf and IL scar/soft tissue atten, small mild amt of inf ischemia,  . Atrial fibrillation     coumadin managed by PCP  . Rheumatic heart disease     a. s/p mechanical (St. Jude) MVR 2002;  b. Echo 5/11: Mild LVH, EF 45-50%, mild AS, mild AI, mean gradient 11 mmHg, MVR with normal gradients, moderate LAE, mild RAE;  c.  Echo 11/13:   mod LVH, EF 55-60%, mild AS (mean 18 mmHg), mild AI, severe LAE, mild RAE, PASP 41  . 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 Anemia   . GERD (gastroesophageal reflux disease)   . ED (erectile dysfunction)   . SOB (shortness of breath)     Past Surgical History  Procedure Laterality Date  . Coronary artery bypass graft      lef tmammary artery to LAD coronary artery and reversed SVG to distal rt coronary artery   . Mitral valve replacement      #33 St. Jude mechanical mitral valve prosthesis. Dr. Servando Snare    Family History  Problem Relation Age of Onset  . Heart disease Neg Hx     early; 1st degree  relatives   . Diabetes Mother   . Hypertension Father   . Thyroid disease Sister   . Diabetes Brother   . Hypertension Brother   . Diabetes Brother     Social History History  Substance Use Topics  . Smoking status: Former Smoker    Types: Cigars    Quit date: 03/06/1999  . Smokeless tobacco: Not on file     Comment: 2005  . Alcohol Use: No    No Known Allergies  Current Outpatient Prescriptions  Medication Sig Dispense Refill  . aspirin 81 MG tablet Take 81 mg by mouth daily.        . beta carotene w/minerals (OCUVITE) tablet Take 1 tablet by mouth daily.        . digoxin (LANOXIN) 0.25 MG tablet Take 125 mcg by mouth daily.       Marland Kitchen esomeprazole (NEXIUM) 40 MG capsule Take 40 mg by mouth daily before breakfast.      . ezetimibe-simvastatin (VYTORIN) 10-20 MG per tablet Take 1 tablet by mouth daily.        . furosemide (LASIX) 40 MG tablet Take 40 mg by mouth daily.       Marland Kitchen  glipiZIDE (GLUCOTROL XL) 10 MG 24 hr tablet Take 10 mg by mouth daily.       . metFORMIN (GLUCOPHAGE) 1000 MG tablet Take 1,000 mg by mouth 2 (two) times daily.        . metoprolol (TOPROL-XL) 200 MG 24 hr tablet Take 200 mg by mouth daily.        . Misc Natural Products (OSTEO BI-FLEX ADV TRIPLE ST) TABS Take 500 each by mouth 2 (two) times daily.        . Omega-3 Fatty Acids (FISH OIL) 1000 MG CAPS Take 1 capsule by mouth 2 (two) times daily.       . potassium chloride SA (K-DUR,KLOR-CON) 20 MEQ tablet Take 10 mEq by mouth daily.       . ramipril (ALTACE) 5 MG capsule Take 5 mg by mouth 2 (two) times daily.        . sildenafil (VIAGRA) 100 MG tablet Take 100 mg by mouth as needed.      . sitaGLIPtin (JANUVIA) 100 MG tablet Take 100 mg by mouth daily.       . Testosterone (ANDROGEL PUMP) 12.5 MG/ACT (1%) GEL Place onto the skin daily.      Marland Kitchen warfarin (COUMADIN) 5 MG tablet Take 5 mg by mouth as directed.        . insulin glargine (LANTUS) 100 UNIT/ML injection Inject 80 Units into the skin as directed.         No current facility-administered medications for this visit.    Review of Systems Review of Systems  Constitutional: Positive for fatigue and unexpected weight change. Negative for fever and chills.  HENT: Negative for hearing loss, congestion, sore throat, trouble swallowing and voice change.   Eyes: Negative for visual disturbance.  Respiratory: Positive for shortness of breath. Negative for cough and wheezing.   Cardiovascular: Negative for chest pain, palpitations and leg swelling.  Gastrointestinal: Positive for abdominal pain. Negative for nausea, vomiting, diarrhea, constipation, blood in stool, abdominal distention, anal bleeding and rectal pain.  Genitourinary: Negative for hematuria and difficulty urinating.  Musculoskeletal: Negative for arthralgias.  Skin: Negative for rash and wound.  Neurological: Negative for seizures, syncope, weakness and headaches.  Hematological: Negative for adenopathy. Bruises/bleeds easily.  Psychiatric/Behavioral: Negative for confusion.    Blood pressure 130/66, pulse 78, temperature 98.6 F (37 C), resp. rate 18, height 6' (1.829 m), weight 249 lb 9.6 oz (113.218 kg).  Physical Exam Physical Exam  Constitutional: He is oriented to person, place, and time. He appears well-nourished. No distress.  obese  HENT:  Head: Normocephalic and atraumatic.  Right Ear: External ear normal.  Left Ear: External ear normal.  Nose: Nose normal.  Mouth/Throat: Oropharynx is clear and moist.  Eyes: Conjunctivae are normal. Pupils are equal, round, and reactive to light. Right eye exhibits no discharge. Left eye exhibits no discharge. No scleral icterus.  Neck: Normal range of motion. Neck supple. No tracheal deviation present. No thyromegaly present.  Cardiovascular: Intact distal pulses.   Murmur heard. Irregular rhythm  Pulmonary/Chest: Effort normal and breath sounds normal. No respiratory distress. He has no wheezes. He has no rales.  Abdominal:  Soft. Bowel sounds are normal. He exhibits mass. There is no tenderness. There is no rebound.  There is a firm palpable dominant mass just below and to the left of the midline. The abdomen is nontender. There is rectus diastases  Musculoskeletal: Normal range of motion. He exhibits no edema and no tenderness.  Lymphadenopathy:  He has no cervical adenopathy.    He has no axillary adenopathy.       Right: No inguinal adenopathy present.       Left: No inguinal adenopathy present.  Neurological: He is alert and oriented to person, place, and time.  Skin: Skin is warm and dry. No rash noted. No erythema.  Psychiatric: His behavior is normal. Judgment normal.    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 He will need preoperative cardiac clearance. I'm going to also have him be evaluated at the [facility]. I discussed the risks of surgery with him which includes but is not limited to bleeding, infection, need for bowel resection, injury to shredding structures, et Ronney Asters. We will plan surgery as soon as possible        Caro Brundidge A 01/16/2013, 4:16 PM

## 2013-01-19 ENCOUNTER — Telehealth: Payer: Self-pay | Admitting: Oncology

## 2013-01-19 NOTE — Telephone Encounter (Signed)
LVOM FOR PT TO RETURN CALL IN RE NP APPT.  °

## 2013-01-20 ENCOUNTER — Telehealth: Payer: Self-pay | Admitting: Oncology

## 2013-01-20 ENCOUNTER — Other Ambulatory Visit: Payer: Self-pay | Admitting: Oncology

## 2013-01-20 DIAGNOSIS — R1900 Intra-abdominal and pelvic swelling, mass and lump, unspecified site: Secondary | ICD-10-CM

## 2013-01-20 NOTE — Telephone Encounter (Signed)
C/D 01/20/13 for appt. 01/21/13

## 2013-01-20 NOTE — Telephone Encounter (Signed)
S/W PT IN RE NP APPT 05/14 @ 10:30 W/DR. SHADAD. [facility] PACK[facility] MAILED.

## 2013-01-21 ENCOUNTER — Ambulatory Visit: Payer: BC Managed Care – PPO | Admitting: Lab

## 2013-01-21 ENCOUNTER — Ambulatory Visit: Payer: BC Managed Care – PPO

## 2013-01-21 ENCOUNTER — Encounter: Payer: Self-pay | Admitting: Oncology

## 2013-01-21 ENCOUNTER — Encounter (HOSPITAL_COMMUNITY)
Admission: RE | Admit: 2013-01-21 | Discharge: 2013-01-21 | Disposition: A | Discharge status: Home / Self Care | Payer: BC Managed Care – PPO | Source: Ambulatory Visit | Attending: Oncology | Admitting: Oncology

## 2013-01-21 ENCOUNTER — Other Ambulatory Visit (HOSPITAL_BASED_OUTPATIENT_CLINIC_OR_DEPARTMENT_OTHER): Payer: BC Managed Care – PPO | Admitting: Lab

## 2013-01-21 ENCOUNTER — Ambulatory Visit (HOSPITAL_BASED_OUTPATIENT_CLINIC_OR_DEPARTMENT_OTHER): Payer: BC Managed Care – PPO | Admitting: Oncology

## 2013-01-21 VITALS — BP 138/55 | HR 67 | Temp 98.6°F | Resp 18 | Ht 72.0 in | Wt 245.7 lb

## 2013-01-21 DIAGNOSIS — R5381 Other malaise: Secondary | ICD-10-CM

## 2013-01-21 DIAGNOSIS — C8299 Follicular lymphoma, unspecified, extranodal and solid organ sites: Secondary | ICD-10-CM

## 2013-01-21 DIAGNOSIS — R5383 Other fatigue: Secondary | ICD-10-CM

## 2013-01-21 DIAGNOSIS — D649 Anemia, unspecified: Secondary | ICD-10-CM

## 2013-01-21 DIAGNOSIS — R1900 Intra-abdominal and pelvic swelling, mass and lump, unspecified site: Secondary | ICD-10-CM

## 2013-01-21 DIAGNOSIS — I2510 Atherosclerotic heart disease of native coronary artery without angina pectoris: Secondary | ICD-10-CM

## 2013-01-21 LAB — COMPREHENSIVE METABOLIC PANEL (CC13)
ALT: 21 U/L (ref 0–55)
AST: 20 U/L (ref 5–34)
Albumin: 2.6 g/dL — ABNORMAL LOW (ref 3.5–5.0)
Alkaline Phosphatase: 143 U/L (ref 40–150)
BUN: 24.3 mg/dL (ref 7.0–26.0)
CO2: 24 mEq/L (ref 22–29)
Calcium: 9.7 mg/dL (ref 8.4–10.4)
Chloride: 106 mEq/L (ref 98–107)
Creatinine: 1 mg/dL (ref 0.7–1.3)
Glucose: 181 mg/dl — ABNORMAL HIGH (ref 70–99)
Potassium: 5.5 mEq/L — ABNORMAL HIGH (ref 3.5–5.1)
Sodium: 140 mEq/L (ref 136–145)
Total Bilirubin: 0.28 mg/dL (ref 0.20–1.20)
Total Protein: 7.4 g/dL (ref 6.4–8.3)

## 2013-01-21 LAB — CBC WITH DIFFERENTIAL/PLATELET
BASO%: 0.8 % (ref 0.0–2.0)
Basophils Absolute: 0.1 10*3/uL (ref 0.0–0.1)
EOS%: 2.6 % (ref 0.0–7.0)
Eosinophils Absolute: 0.3 10*3/uL (ref 0.0–0.5)
HCT: 26 % — ABNORMAL LOW (ref 38.4–49.9)
HGB: 7.8 g/dL — ABNORMAL LOW (ref 13.0–17.1)
LYMPH%: 7.6 % — ABNORMAL LOW (ref 14.0–49.0)
MCH: 20.7 pg — ABNORMAL LOW (ref 27.2–33.4)
MCHC: 30.1 g/dL — ABNORMAL LOW (ref 32.0–36.0)
MCV: 68.7 fL — ABNORMAL LOW (ref 79.3–98.0)
MONO#: 0.9 10*3/uL (ref 0.1–0.9)
MONO%: 7.9 % (ref 0.0–14.0)
NEUT#: 9.4 10*3/uL — ABNORMAL HIGH (ref 1.5–6.5)
NEUT%: 81.1 % — ABNORMAL HIGH (ref 39.0–75.0)
Platelets: 552 10*3/uL — ABNORMAL HIGH (ref 140–400)
RBC: 3.78 10*6/uL — ABNORMAL LOW (ref 4.20–5.82)
RDW: 17.1 % — ABNORMAL HIGH (ref 11.0–14.6)
WBC: 11.5 10*3/uL — ABNORMAL HIGH (ref 4.0–10.3)
lymph#: 0.9 10*3/uL (ref 0.9–3.3)

## 2013-01-21 LAB — ABO/RH: ABO/RH(D): A POS

## 2013-01-21 NOTE — Progress Notes (Signed)
Checked in new patient. No financial issues. °

## 2013-01-21 NOTE — Progress Notes (Signed)
Reason for Referral:  Abdominal mass and anemia   HPI: This is a 62 year old gentleman native of [place] where he lived the majority of his life. 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 His symptoms date back to the fall of 2013 where he started developing weakness fatigue and tiredness as well as dyspnea on exertion. At that time he was noted to be anemic with a hemoglobin drifted from 13 down to 9.4. He had an extensive GI workup that includes a colonoscopy, endoscopy, capsule endoscopy and CT enteroscopy. His CT showed a Thick-walled cavitary process centered in the distal ileum highly suspicious for small bowel lymphoma. Adjacent adenopathy in the ileocolic mesentery is likely also lymphomatous. For that reason, patient was referred to to Dr. Ninfa Linden for an open biopsy. He is scheduled to have that done after his cardiac clearance and I was asked to comment on his condition oncology standpoint. Clinically Mr. Cimorelli is mildly symptomatic predominantly due to his anemia. He did not report any hemoptysis or hematemesis did not report any hematochezia or melena but does report significant fatigue, dyspnea on exertion and shortness of breath. He is able to perform some activities of daily living but his exercise tolerance is very limited. He is not reporting any symptoms of fevers chills night sweats or appetite changes. He did not report any bulky adenopathy. Despite his fatigue and tiredness he was working up till recently where he lost his job.   Past Medical History  Diagnosis Date  . DM (diabetes mellitus)   . HLD (hyperlipidemia)   . HTN (hypertension)   . Rheumatic fever     as a child  . CAD (coronary artery disease)     a. s/p CABG 11/2000 (L-LAD, S-RCA);  b. Lexiscan Myoview 11/12: EF 45%, ischemia and possibly some scar at the base of the inferolateral wall;   c. Lex MV 11/13:  EF 44%, Inf and IL scar/soft tissue atten, small  mild amt of inf ischemia,  . Atrial fibrillation     coumadin managed by PCP  . Rheumatic heart disease     a. s/p mechanical (St. Jude) MVR 2002;  b. Echo 5/11: Mild LVH, EF 45-50%, mild AS, mild AI, mean gradient 11 mmHg, MVR with normal gradients, moderate LAE, mild RAE;  c.  Echo 11/13:   mod LVH, EF 55-60%, mild AS (mean 18 mmHg), mild AI, severe LAE, mild RAE, PASP 41  . 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 Anemia   . GERD (gastroesophageal reflux disease)   . ED (erectile dysfunction)   . SOB (shortness of breath)   :  Past Surgical History  Procedure Laterality Date  . Coronary artery bypass graft      lef tmammary artery to LAD coronary artery and reversed SVG to distal rt coronary artery   . Mitral valve replacement      #33 St. Jude mechanical mitral valve prosthesis. Dr. Servando Snare  :  Current outpatient prescriptions:aspirin 81 MG tablet, Take 81 mg by mouth daily.  , Disp: , Rfl: ;  beta carotene w/minerals (OCUVITE) tablet, Take 1 tablet by mouth daily.  , Disp: , Rfl: ;  digoxin (LANOXIN) 0.25 MG tablet, Take 125 mcg by mouth daily. , Disp: , Rfl: ;  esomeprazole (NEXIUM) 40 MG capsule, Take 40 mg by mouth daily before breakfast., Disp: , Rfl:  ezetimibe-simvastatin (VYTORIN) 10-20 MG per tablet, Take 1 tablet by mouth daily.  ,  Disp: , Rfl: ;  furosemide (LASIX) 40 MG tablet, Take 40 mg by mouth daily. , Disp: , Rfl: ;  glipiZIDE (GLUCOTROL XL) 10 MG 24 hr tablet, Take 10 mg by mouth daily. , Disp: , Rfl: ;  insulin glargine (LANTUS) 100 UNIT/ML injection, Inject 80 Units into the skin as directed. , Disp: , Rfl:  metFORMIN (GLUCOPHAGE) 1000 MG tablet, Take 1,000 mg by mouth 2 (two) times daily.  , Disp: , Rfl: ;  metoprolol (TOPROL-XL) 200 MG 24 hr tablet, Take 200 mg by mouth daily.  , Disp: , Rfl: ;  Misc Natural Products (OSTEO BI-FLEX ADV TRIPLE ST) TABS, Take 500 each by mouth 2 (two) times daily.  , Disp: , Rfl: ;  Omega-3 Fatty Acids (FISH OIL) 1000 MG CAPS, Take  1 capsule by mouth 2 (two) times daily. , Disp: , Rfl:  potassium chloride SA (K-DUR,KLOR-CON) 20 MEQ tablet, Take 10 mEq by mouth daily. , Disp: , Rfl: ;  ramipril (ALTACE) 5 MG capsule, Take 5 mg by mouth 2 (two) times daily.  , Disp: , Rfl: ;  sildenafil (VIAGRA) 100 MG tablet, Take 100 mg by mouth as needed., Disp: , Rfl: ;  sitaGLIPtin (JANUVIA) 100 MG tablet, Take 100 mg by mouth daily. , Disp: , Rfl: ;  Testosterone (ANDROGEL PUMP) 12.5 MG/ACT (1%) GEL, Place onto the skin daily., Disp: , Rfl:  warfarin (COUMADIN) 5 MG tablet, Take 5 mg by mouth as directed.  , Disp: , Rfl: :  No Known Allergies:  Family History  Problem Relation Age of Onset  . Heart disease Neg Hx     early; 1st degree relatives   . Diabetes Mother   . Hypertension Father   . Thyroid disease Sister   . Diabetes Brother   . Hypertension Brother   . Diabetes Brother   :  History   Social History  . Marital Status: Married    Spouse Name: N/A    Number of Children: N/A  . Years of Education: N/A   Occupational History  . Not on file.   Social History Main Topics  . Smoking status: Former Smoker    Types: Cigars    Quit date: 03/06/1999  . Smokeless tobacco: Not on file     Comment: 2005  . Alcohol Use: No  . Drug Use: No     Comment: quit in 2000 'reefer'  . Sexually Active: Not on file   Other Topics Concern  . 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  :  A comprehensive review of systems was negative.  Exam: Blood pressure 138/55, pulse 67, temperature 98.6 F (37 C), temperature source Oral, resp. rate 18, height 6' (1.829 m), weight 245 lb 11.2 oz (111.449 kg).  General appearance: alert, cooperative and appears stated age Head: Normocephalic, without obvious abnormality, atraumatic Eyes: conjunctivae/corneas clear. PERRL, EOM's intact. Fundi benign. Nose: Nares normal. Septum midline. Mucosa normal. No drainage or sinus  tenderness. Throat: lips, mucosa, and tongue normal; teeth and gums normal Resp: clear to auscultation bilaterally Chest wall: no tenderness Cardio: regular rate and rhythm, S1, S2 normal, no murmur, click, rub or gallop GI: soft, non-tender; bowel sounds normal; no masses,  no organomegaly Extremities: extremities normal, atraumatic, no cyanosis or edema Pulses: 2+ and symmetric Lymph nodes: Cervical, supraclavicular, and axillary nodes normal.   Recent Labs  01/21/13 1050  WBC 11.5*  HGB 7.8*  HCT 26.0*  PLT  552*    Recent Labs  01/21/13 1050  NA 140  K 5.5*  CL 106  CO2 24  GLUCOSE 181*  BUN 24.3  CREATININE 1.0  CALCIUM 9.7    Ct Entero Abd/pelvis W/cm  01/05/2013   **ADDENDUM** CREATED: 01/05/2013 11:53:23  Not mentioned in the impressions section is a new fat density lesion at the hepatic dome.  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  **END ADDENDUM** SIGNED BY: Charlann Boxer. Jobe Igo, M.D.  01/05/2013   *RADIOLOGY REPORT*  Clinical Data:  Anemia.  Heme positive stool.  Weight loss of 12 pounds over 3 months.  Negative endoscopy, colonoscopy, and capsule endoscopy.  CT ABDOMEN AND PELVIS WITH CONTRAST (CT ENTEROGRAPHY)  Technique:  Multidetector CT of the abdomen and pelvis during bolus administration of intravenous contrast. Negative oral contrast VoLumen was given.  Contrast: 174mL OMNIPAQUE IOHEXOL 300 MG/ML  SOLN BUN and creatinine were obtained on site at [facility] at [address] Results:  BUN 27 mg/dL,  Creatinine 0.9 mg/dL.  Comparison:  CT 09/29/2011  Findings: Lung bases:  Clear lung bases.  Mild cardiomegaly with prior mitral valve repair and median sternotomy. No pericardial or pleural effusion.  Coronary artery disease.  Abdomen/pelvis:  Hepatic dome low density lesion measures 1.3 cm on image 8/series 3 and is favored to be a fat density.  New since prior exam.  Mild hepatic steatosis suspected.  Normal spleen, stomach.  Mild pancreatic  atrophy.  Normal gallbladder, biliary tract, adrenal glands, kidneys.  A preaortic node measures 8 mm on image 49/series 3 versus 2 mm on the prior exam. Retrocaval node measures 1.0 cm on image 42/series 3 versus 8 mm on the prior.  Scattered colonic diverticula.  Normal terminal ileum.  The extent of small bowel distention with neutral contrast is moderate.  There is a process within the distal ileum which is peripherally thick-walled and has central gas and solid enteric contents within.  This measures on the order of 9.4 x 10.1 cm on image 136/series 2.  7.3 x 10.1 cm on coronal image 69/series 400. Other bowel loops which are intimately associated, without definitive evidence of fistulous communication.  Ileocolic mesenteric nodes which measure 1.3 cm on image 115/series 2.  Newly enlarged.  No free perforation or extraluminal fluid/gas.  Bilateral fat containing inguinal hernias.  No pelvic sidewall adenopathy. Normal urinary bladder and prostate.  No significant free fluid.  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  Degenerative disc disease at the lumbosacral junction.  IMPRESSION:  1.  Thick-walled cavitary process centered in the distal ileum. Highly suspicious for small bowel lymphoma.  Adjacent adenopathy in the ileocolic mesentery is likely also lymphomatous. 2.  Nonpathologic sized but slightly enlarged retroperitoneal nodes.  Lymphomatous involvement cannot be excluded.  These results will be called to the ordering clinician or representative by the Radiologist Assistant, and communication documented in the PACS Dashboard.   Original Report Authenticated By: Abigail Miyamoto, M.D.    Assessment and Plan:  62 year old gentleman with the following issues:  1. 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 Patient is scheduled to have an open biopsy in the near future under  the care of Dr. Ninfa Linden. I discussed the differential diagnosis today with Mr. Czerniak of the small bowel mass. Small bowel lymphoma as definite possibility that other possibilities but other possibilities would include adenocarcinoma of the  small bowel, sarcoma among other possibilities. Operating under the assumption that this is lymphoma I agree with the best course of action which is an open biopsy on submitting enough tissue for further quantification of this process. If this is indeed small bowel lymphoma I discussed the treatment options today in detail with Mr. Rohal. Multiagent systemic chemotherapy would be the way to go at this time given the fact that lymphoma is a disease that is treated with chemotherapy and surgery would only to establish the diagnosis. Once his surgery and biopsy have been completed and the nature of his tumor has been identified we can plan the chemotherapy accordingly. I will schedule a quick followup after his operation to discuss treatment options. I will also schedule him to a PET CT scan for the staging purposes in case we are dealing with lymphoma.  2. Anemia: Appears to be microcytic in nature probably due to iron deficiency from chronic blood loss. Discussed treatment options at this point including oral iron which is not sufficient versus IV iron versus a blood transfusion. I feel the quickest way to make him feel better and optimize his hemoglobin for an operation would be a blood transfusion. Risks and benefits discussed in details and I will set him up to have that done on 01/23/2013. I continue to encourage him to take by mouth iron as well.  3. IV access I will ask Dr. Ninfa Linden to kindly insert a Port-A-Cath with his biopsy procedure due to likelihood that he will need systemic chemotherapy and I will be easier to be all done at once sufficiently he is on Coumadin at high risk for an operation and would be reasonable to do that all at one time.  4.  Coronary disease: Patient is followed closely with Dr.Hochrein and he is getting cardiac clearance from him. I would like to repeat a study to evaluate his LVF with or without echocardiogram or MUGA.  5. Followup: I will send him up with a quick followup after his operation in the next couple weeks

## 2013-01-22 ENCOUNTER — Telehealth (INDEPENDENT_AMBULATORY_CARE_PROVIDER_SITE_OTHER): Payer: Self-pay | Admitting: *Deleted

## 2013-01-22 ENCOUNTER — Telehealth: Payer: Self-pay | Admitting: Oncology

## 2013-01-22 NOTE — Telephone Encounter (Signed)
As far as I know, he is supposed to be seeing a cardiologist for preop clearance.  I don't know who are when and I haven't heard back.  Can't remember if John [name] was with me that day or who knows

## 2013-01-22 NOTE — Telephone Encounter (Signed)
Wife called to ask if cardiac clearance has been received so patient's surgery can be scheduled.

## 2013-01-23 ENCOUNTER — Ambulatory Visit (HOSPITAL_BASED_OUTPATIENT_CLINIC_OR_DEPARTMENT_OTHER): Payer: BC Managed Care – PPO

## 2013-01-23 ENCOUNTER — Other Ambulatory Visit: Payer: Self-pay | Admitting: *Deleted

## 2013-01-23 VITALS — BP 125/63 | HR 53 | Temp 98.2°F | Resp 20

## 2013-01-23 DIAGNOSIS — D649 Anemia, unspecified: Secondary | ICD-10-CM

## 2013-01-23 LAB — PREPARE RBC (CROSSMATCH)

## 2013-01-23 MED ORDER — SODIUM CHLORIDE 0.9 % IV SOLN
250.0000 mL | Freq: Once | INTRAVENOUS | Status: AC
  Administered 2013-01-23: 250 mL via INTRAVENOUS

## 2013-01-23 NOTE — Telephone Encounter (Signed)
We are waiting to hear from Dr Percival Spanish.

## 2013-01-23 NOTE — Patient Instructions (Addendum)
Blood Transfusion Information WHAT IS A BLOOD TRANSFUSION? A transfusion is the replacement of blood or some of its parts. Blood is made up of multiple cells which provide different functions.  Red blood cells carry oxygen and are used for blood loss replacement.  White blood cells fight against infection.  Platelets control bleeding.  Plasma helps clot blood.  Other blood products are available for specialized needs, such as hemophilia or other clotting disorders. BEFORE THE TRANSFUSION  Who gives blood for transfusions?   You may be able to donate blood to be used at a later date on yourself (autologous donation).  Relatives can be asked to donate blood. This is generally not any safer than if you have received blood from a stranger. The same precautions are taken to ensure safety when a relative's blood is donated.  Healthy volunteers who are fully evaluated to make sure their blood is safe. This is blood bank blood. Transfusion therapy is the safest it has ever been in the practice of medicine. Before blood is taken from a donor, a complete history is taken to make sure that person has no history of diseases nor engages in risky social behavior (examples are intravenous drug use or sexual activity with multiple partners). The donor's travel history is screened to minimize risk of transmitting infections, such as malaria. The donated blood is tested for signs of infectious diseases, such as HIV and hepatitis. The blood is then tested to be sure it is compatible with you in order to minimize the chance of a transfusion reaction. If you or a relative donates blood, this is often done in anticipation of surgery and is not appropriate for emergency situations. It takes many days to process the donated blood. RISKS AND COMPLICATIONS Although transfusion therapy is very safe and saves many lives, the main dangers of transfusion include:   Getting an infectious disease.  Developing a  transfusion reaction. This is an allergic reaction to something in the blood you were given. Every precaution is taken to prevent this. The decision to have a blood transfusion has been considered carefully by your caregiver before blood is given. Blood is not given unless the benefits outweigh the risks. AFTER THE TRANSFUSION  Right after receiving a blood transfusion, you will usually feel much better and more energetic. This is especially true if your red blood cells have gotten low (anemic). The transfusion raises the level of the red blood cells which carry oxygen, and this usually causes an energy increase.  The nurse administering the transfusion will monitor you carefully for complications. HOME CARE INSTRUCTIONS  No special instructions are needed after a transfusion. You may find your energy is better. Speak with your caregiver about any limitations on activity for underlying diseases you may have. SEEK MEDICAL CARE IF:   Your condition is not improving after your transfusion.  You develop redness or irritation at the intravenous (IV) site. SEEK IMMEDIATE MEDICAL CARE IF:  Any of the following symptoms occur over the next 12 hours:  Shaking chills.  You have a temperature by mouth above 102 F (38.9 C), not controlled by medicine.  Chest, back, or muscle pain.  People around you feel you are not acting correctly or are confused.  Shortness of breath or difficulty breathing.  Dizziness and fainting.  You get a rash or develop hives.  You have a decrease in urine output.  Your urine turns a dark color or changes to pink, red, or brown. Any of the following   symptoms occur over the next 10 days:  You have a temperature by mouth above 102 F (38.9 C), not controlled by medicine.  Shortness of breath.  Weakness after normal activity.  The white part of the eye turns yellow (jaundice).  You have a decrease in the amount of urine or are urinating less often.  Your  urine turns a dark color or changes to pink, red, or brown. Document Released: 08/24/2000 Document Revised: 11/19/2011 Document Reviewed: 04/12/2008 Exit[employer] Patient Information 2013 [employer].  

## 2013-01-25 LAB — TYPE AND SCREEN
ABO/RH(D): A POS
Antibody Screen: NEGATIVE
Unit division: 0
Unit division: 0

## 2013-01-26 ENCOUNTER — Encounter (HOSPITAL_COMMUNITY)
Admission: RE | Admit: 2013-01-26 | Discharge: 2013-01-26 | Disposition: A | Discharge status: Home / Self Care | Payer: BC Managed Care – PPO | Source: Ambulatory Visit | Attending: Oncology | Admitting: Oncology

## 2013-01-26 ENCOUNTER — Encounter (INDEPENDENT_AMBULATORY_CARE_PROVIDER_SITE_OTHER): Payer: Self-pay

## 2013-01-26 ENCOUNTER — Telehealth: Payer: Self-pay | Admitting: Cardiology

## 2013-01-26 DIAGNOSIS — D649 Anemia, unspecified: Secondary | ICD-10-CM

## 2013-01-26 DIAGNOSIS — C8299 Follicular lymphoma, unspecified, extranodal and solid organ sites: Secondary | ICD-10-CM | POA: Insufficient documentation

## 2013-01-26 LAB — GLUCOSE, CAPILLARY: Glucose-Capillary: 89 mg/dL (ref 70–99)

## 2013-01-26 MED ORDER — FLUDEOXYGLUCOSE F - 18 (FDG) INJECTION
19.5000 | Freq: Once | INTRAVENOUS | Status: AC | PRN
  Administered 2013-01-26: 19.5 via INTRAVENOUS

## 2013-01-26 NOTE — Telephone Encounter (Signed)
appt given for Thursday at 4:30  pm

## 2013-01-26 NOTE — Telephone Encounter (Signed)
Follow up   Pt returning your call back she stated you'll got disconnected  Call her back at [phone]

## 2013-01-26 NOTE — Telephone Encounter (Signed)
New problem   pts wife calling about setting up surgery

## 2013-01-27 ENCOUNTER — Encounter (HOSPITAL_COMMUNITY)
Admission: RE | Admit: 2013-01-27 | Discharge: 2013-01-27 | Disposition: A | Discharge status: Home / Self Care | Payer: BC Managed Care – PPO | Source: Ambulatory Visit | Attending: Oncology | Admitting: Oncology

## 2013-01-27 ENCOUNTER — Encounter (HOSPITAL_COMMUNITY): Payer: Self-pay

## 2013-01-27 DIAGNOSIS — C8299 Follicular lymphoma, unspecified, extranodal and solid organ sites: Secondary | ICD-10-CM

## 2013-01-27 DIAGNOSIS — D649 Anemia, unspecified: Secondary | ICD-10-CM

## 2013-01-27 MED ORDER — TECHNETIUM TC 99M-LABELED RED BLOOD CELLS IV KIT
25.6000 | PACK | Freq: Once | INTRAVENOUS | Status: AC | PRN
Start: 2013-01-27 — End: 2013-01-27
  Administered 2013-01-27: 26 via INTRAVENOUS

## 2013-01-29 ENCOUNTER — Encounter: Payer: Self-pay | Admitting: Cardiology

## 2013-01-29 ENCOUNTER — Ambulatory Visit (INDEPENDENT_AMBULATORY_CARE_PROVIDER_SITE_OTHER): Payer: BC Managed Care – PPO | Admitting: Cardiology

## 2013-01-29 VITALS — BP 136/67 | HR 76 | Ht 72.0 in | Wt 247.4 lb

## 2013-01-29 DIAGNOSIS — Z9889 Other specified postprocedural states: Secondary | ICD-10-CM

## 2013-01-29 DIAGNOSIS — I4891 Unspecified atrial fibrillation: Secondary | ICD-10-CM

## 2013-01-29 DIAGNOSIS — I2510 Atherosclerotic heart disease of native coronary artery without angina pectoris: Secondary | ICD-10-CM

## 2013-01-29 DIAGNOSIS — R079 Chest pain, unspecified: Secondary | ICD-10-CM

## 2013-01-29 NOTE — Patient Instructions (Addendum)
The current medical regimen is effective;  continue present plan and medications.  Please have INR checked.  Please have them fax [place] the results [phone] attn PAM  You have been cleared for surgery.  You will need to be bridged with Lovenox before having your surgery.  Follow up with Dr Percival Spanish after your surgery.

## 2013-01-29 NOTE — Progress Notes (Signed)
HPI The patient presents for preoperative evaluation.  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  He is going to have biopsy and possible resection of this followed by probable chemotherapy. He has been seen by oncology and general surgery. This was all initiated when he had an anemia workup. He had seen [place] in the fall and was referred to GI. He had been complaining of dyspnea and it was ultimately thought to be related likely to progressive anemia. He was transfused recently and actually feels better. Last fall he did have an echo which suggested his EF to be about 55%. He has a normally functioning mechanical mitral valve. He had a stress perfusion study which suggested perhaps a small inferoapical inferolateral scar with peri-infarct ischemia and an ejection fraction of 44%. This was unchanged compared with a similar study in 2012. He has since had a MUGA with an ejection fraction of 49%.  The patient denies any ongoing chest pressure, neck or arm discomfort. He has had no new palpitations, presyncope or syncope. He denies any shortness of breath, PND or orthopnea. He has had no weight gain or edema. He was very fatigued and lightheaded with the dyspnea prior to the transfusion.  No Known Allergies  Current Outpatient Prescriptions  Medication Sig Dispense Refill  . aspirin 81 MG tablet Take 81 mg by mouth daily.        . beta carotene w/minerals (OCUVITE) tablet Take 1 tablet by mouth daily.        . digoxin (LANOXIN) 0.25 MG tablet Take 125 mcg by mouth daily.       Marland Kitchen esomeprazole (NEXIUM) 40 MG capsule Take 40 mg by mouth daily before breakfast.      . ezetimibe-simvastatin (VYTORIN) 10-20 MG per tablet Take 1 tablet by mouth daily.        . furosemide (LASIX) 40 MG tablet Take 40 mg by mouth daily.       Marland Kitchen glipiZIDE (GLUCOTROL XL) 10 MG 24 hr tablet Take 10 mg by mouth daily.       . insulin  glargine (LANTUS) 100 UNIT/ML injection Inject 80 Units into the skin as directed.       . metFORMIN (GLUCOPHAGE) 1000 MG tablet Take 1,000 mg by mouth 2 (two) times daily.        . metoprolol (TOPROL-XL) 200 MG 24 hr tablet Take 200 mg by mouth daily.        . Misc Natural Products (OSTEO BI-FLEX ADV TRIPLE ST) TABS Take 500 each by mouth 2 (two) times daily.        . Omega-3 Fatty Acids (FISH OIL) 1000 MG CAPS Take 1 capsule by mouth 2 (two) times daily.       . potassium chloride SA (K-DUR,KLOR-CON) 20 MEQ tablet Take 10 mEq by mouth daily.       . ramipril (ALTACE) 5 MG capsule Take 5 mg by mouth 2 (two) times daily.        . sildenafil (VIAGRA) 100 MG tablet Take 100 mg by mouth as needed.      . sitaGLIPtin (JANUVIA) 100 MG tablet Take 100 mg by mouth daily.       Marland Kitchen warfarin (COUMADIN) 5 MG tablet Take 5 mg by mouth as directed.         No current facility-administered medications for this visit.    Past Medical History  Diagnosis Date  . DM (diabetes mellitus)   . HLD (hyperlipidemia)   . HTN (hypertension)   . Rheumatic fever     as a child  . CAD (coronary artery disease)     a. s/p CABG 11/2000 (L-LAD, S-RCA);  b. Lexiscan Myoview 11/12: EF 45%, ischemia and possibly some scar at the base of the inferolateral wall;   c. Lex MV 11/13:  EF 44%, Inf and IL scar/soft tissue atten, small mild amt of inf ischemia,  . Atrial fibrillation     coumadin managed by PCP  . Rheumatic heart disease     a. s/p mechanical (St. Jude) MVR 2002;  b. Echo 5/11: Mild LVH, EF 45-50%, mild AS, mild AI, mean gradient 11 mmHg, MVR with normal gradients, moderate LAE, mild RAE;  c.  Echo 11/13:   mod LVH, EF 55-60%, mild AS (mean 18 mmHg), mild AI, severe LAE, mild RAE, PASP 41  . 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 Anemia   . GERD (gastroesophageal reflux disease)   . ED (erectile dysfunction)   . SOB (shortness of breath)     Past Surgical History  Procedure Laterality Date  . Coronary  artery bypass graft      lef tmammary artery to LAD coronary artery and reversed SVG to distal rt coronary artery   . Mitral valve replacement      #33 St. Jude mechanical mitral valve prosthesis. Dr. Servando Snare    ROS:  As stated in the HPI and negative for all other systems.  PHYSICAL EXAM BP 136/67  Pulse 76  Ht 6' (1.829 m)  Wt 247 lb 6.4 oz (112.22 kg)  BMI 33.55 kg/m2 GENERAL:  Well appearing HEENT:  Pupils equal round and reactive, fundi not visualized, oral mucosa unremarkable NECK:  No jugular venous distention, waveform within normal limits, carotid upstroke brisk and symmetric, no bruits, no thyromegaly LYMPHATICS:  No cervical, inguinal adenopathy LUNGS:  Clear to auscultation bilaterally BACK:  No CVA tenderness CHEST:  Well healed sternotomy scar. HEART:  PMI not displaced or sustained, mechanical S1,  S2 within normal limits, no S3 , no clicks, no rubs, no murmurs, irregular ABD:  Flat, positive bowel sounds normal in frequency in pitch, no bruits, no rebound, no guarding, no midline pulsatile mass, no hepatomegaly, no splenomegaly, obese EXT:  2 plus pulses throughout, no edema, no cyanosis no clubbing SKIN:  No rashes no nodules.   EKG: Atrial fibrillation, rate 76 right axis deviation, no acute ST-T wave changes. 01/29/2013   ASSESSMENT AND PLAN  PREOP- The patient will be at acceptable risk for the planned procedure as he's had no new symptoms since a stress test last year. There were no high-risk findings. No further cardiovascular testing will be suggested.  CAD - As above. The patient does have a mildly reduced ejection fraction which has been stable. This will of course need to be followed closely pending the decision on chemotherapy.  ATRIAL FIBRILLATION - Given this and he is mitral valve he will need bridging Lovenox. He says he's not had his INR checked in a while and he will go tomorrow to his primary provider to have this done. We will then begin to  followup the INR for Lovenox transition.  Once this note is reviewed by the surgeons we will need to be given the date for the planned surgery.  MECHANICAL MVR -  He had normal function on his echo last year. Anticoagulation will be as above.

## 2013-01-30 ENCOUNTER — Other Ambulatory Visit (INDEPENDENT_AMBULATORY_CARE_PROVIDER_SITE_OTHER): Payer: Self-pay | Admitting: Surgery

## 2013-01-30 ENCOUNTER — Telehealth: Payer: Self-pay | Admitting: Cardiology

## 2013-01-30 ENCOUNTER — Telehealth: Payer: Self-pay | Admitting: Oncology

## 2013-01-30 ENCOUNTER — Other Ambulatory Visit: Payer: Self-pay | Admitting: Cardiology

## 2013-01-30 LAB — PROTIME-INR
INR: 2.43 — ABNORMAL HIGH (ref <1.50)
Prothrombin Time: 25.5 seconds — ABNORMAL HIGH (ref 11.6–15.2)

## 2013-01-30 NOTE — Telephone Encounter (Signed)
Per wife  -  Surgery date has been set for 02/09/2013.  Dr Percival Spanish was/is sending a note to [facility] to bridge pt for his surgical procedure.  Pt is followed by Dr Murrell Redden office for [facility] [place].  He had one today however wife doesn't know the results and they are closed for the day.  Per wife pt still has some Lovenox from a recent bridge for colonoscopy.  Spoke with Maudie Mercury in [facility].  Aware pt needs bridging and she will notify Gay Filler.  Wife aware we will contact her next Tuesday with instructions.

## 2013-01-30 NOTE — Telephone Encounter (Signed)
New problem   Pt's sx date has been set for 02/09/13. Please call spouse concerning Lovanox.

## 2013-02-03 ENCOUNTER — Encounter (HOSPITAL_COMMUNITY): Payer: Self-pay | Admitting: Pharmacy Technician

## 2013-02-03 NOTE — Telephone Encounter (Signed)
New Prob      Pts daughter wanted to report INR: 2.43. Would like to speak to nurse regarding starting LOVANOX.

## 2013-02-03 NOTE — Telephone Encounter (Signed)
Spoke with pt's daughter.  Procedure is scheduled for procedure on 6/2.  INR on 5/23 was 2.43.  Pt's weight- 112kg.  SCr- 1.0.  Pt has ~ 7 syringes of 100mg  Lovenox at home already.  Will use those syringes.   5/28- Last dose of Coumadin 5/29- No Coumadin or Lovenox 5/30- Lovenox 100mg  in AM and PM 5/31- Lovenox 100mg  in AM and PM 6/1- Lovenox 100mg  in AM and PM (make sure last dose is prior to 10pm) 6/2- Day of procedure.  Anticipate pt to be admitted for several days after procedure.  Will follow up with Dr. Edrick Oh for management once discharged.    Will fax a copy of instructions to daughter at [phone].

## 2013-02-04 ENCOUNTER — Ambulatory Visit: Payer: BC Managed Care – PPO | Admitting: Physician Assistant

## 2013-02-04 NOTE — Patient Instructions (Signed)
John [name]  02/04/2013   Your procedure is scheduled on:  02/09/13    Report to [facility] at     0730 AM.  Call this number if you have problems the morning of surgery: [phone]   Remember:   Do not eat food or drink liquids after midnight.   Take these medicines the morning of surgery with A SIP OF WATER:    Do not wear jewelry  Do not wear lotions, powders, or perfumes   Men may shave face and neck.  Do not bring valuables to the hospital.  Contacts, dentures or bridgework may not be worn into surgery.  Leave suitcase in the car. After surgery it may be brought to your room.  For patients admitted to the hospital, checkout time is 11:00 AM the day of  discharge.     SEE CHG INSTRUCTION SHEET    Please read over the following fact sheets that you were given: MRSA Information, coughing and deep breathing exercises, leg exercises, Blood Transfusion Fact sheet                Failure to comply with these instructions may result in cancellation of your surgery.                Patient Signature ____________________________              Nurse Signature _____________________________

## 2013-02-05 ENCOUNTER — Encounter (HOSPITAL_COMMUNITY)
Admission: RE | Admit: 2013-02-05 | Discharge: 2013-02-05 | Disposition: A | Discharge status: Home / Self Care | Payer: BC Managed Care – PPO | Source: Ambulatory Visit | Attending: Surgery | Admitting: Surgery

## 2013-02-05 ENCOUNTER — Encounter (HOSPITAL_COMMUNITY): Payer: Self-pay

## 2013-02-05 DIAGNOSIS — Z01812 Encounter for preprocedural laboratory examination: Secondary | ICD-10-CM | POA: Insufficient documentation

## 2013-02-05 DIAGNOSIS — R1900 Intra-abdominal and pelvic swelling, mass and lump, unspecified site: Secondary | ICD-10-CM | POA: Insufficient documentation

## 2013-02-05 HISTORY — DX: Calculus of kidney: N20.0

## 2013-02-05 HISTORY — DX: Unspecified osteoarthritis, unspecified site: M19.90

## 2013-02-05 LAB — BASIC METABOLIC PANEL
BUN: 20 mg/dL (ref 6–23)
CO2: 26 mEq/L (ref 19–32)
Calcium: 9.1 mg/dL (ref 8.4–10.5)
Chloride: 106 mEq/L (ref 96–112)
Creatinine, Ser: 0.89 mg/dL (ref 0.50–1.35)
GFR calc Af Amer: >90 mL/min (ref >90)
GFR calc non Af Amer: >90 mL/min (ref >90)
Glucose, Bld: 260 mg/dL — ABNORMAL HIGH (ref 70–99)
Potassium: 5 mEq/L (ref 3.5–5.1)
Sodium: 140 mEq/L (ref 135–145)

## 2013-02-05 LAB — CBC
HCT: 29.9 % — ABNORMAL LOW (ref 39.0–52.0)
Hemoglobin: 9 g/dL — ABNORMAL LOW (ref 13.0–17.0)
MCH: 22 pg — ABNORMAL LOW (ref 26.0–34.0)
MCHC: 30.1 g/dL (ref 30.0–36.0)
MCV: 72.9 fL — ABNORMAL LOW (ref 78.0–100.0)
Platelets: 272 10*3/uL (ref 150–400)
RBC: 4.1 MIL/uL — ABNORMAL LOW (ref 4.22–5.81)
RDW: 17.8 % — ABNORMAL HIGH (ref 11.5–15.5)
WBC: 8.5 10*3/uL (ref 4.0–10.5)

## 2013-02-05 LAB — SURGICAL PCR SCREEN
MRSA, PCR: NEGATIVE
Staphylococcus aureus: NEGATIVE

## 2013-02-05 LAB — APTT: aPTT: 53 seconds — ABNORMAL HIGH (ref 24–37)

## 2013-02-05 LAB — PROTIME-INR
INR: 2.47 — ABNORMAL HIGH (ref 0.00–1.49)
Prothrombin Time: 25.6 seconds — ABNORMAL HIGH (ref 11.6–15.2)

## 2013-02-05 NOTE — Progress Notes (Signed)
01/27/13 MUGA in [facility]  01/29/13 EKG- EPIC  Last office visit with DR [facility] in [facility]  07/25/12 [place]

## 2013-02-05 NOTE — Progress Notes (Signed)
Your patient has screened at an elevated risk for OBstructive Sleep Apnea using the STOP-Bang Tool during a presurgical visit.  A score of 4 or greater is considered an elevated risk.

## 2013-02-08 NOTE — H&P (Signed)
Patient ID: John [name], male DOB: [birth year], 62 y.o. MRN: [id number]  Chief Complaint   Patient presents with   .  Other     small bowel mass   HPI  John [name] is a 62 y.o. male.  HPI  This gentleman is referred by Dr. Paulita Fujita for evaluation of an intra-abdominal mass and anemia. He's been having issues with anemia for last 6 months. He is currently on Coumadin. He has some slight abdominal pain but no nausea or vomiting or obstructive symptoms. There is no gross blood in his bowel movements. He denies fevers or night sweats. He does have fatigue and weakness. He reports a 12 pound weight loss  Past Medical History   Diagnosis  Date   .  DM (diabetes mellitus)    .  HLD (hyperlipidemia)    .  HTN (hypertension)    .  Rheumatic fever      as a child   .  CAD (coronary artery disease)      a. s/p CABG 11/2000 (L-LAD, S-RCA); b. Lexiscan Myoview 11/12: EF 45%, ischemia and possibly some scar at the base of the inferolateral wall; c. Lex MV 11/13: EF 44%, Inf and IL scar/soft tissue atten, small mild amt of inf ischemia,   .  Atrial fibrillation      coumadin managed by PCP   .  Rheumatic heart disease      a. s/p mechanical (St. Jude) MVR 2002; b. Echo 5/11: Mild LVH, EF 45-50%, mild AS, mild AI, mean gradient 11 mmHg, MVR with normal gradients, moderate LAE, mild RAE; c. Echo 11/13: mod LVH, EF 55-60%, mild AS (mean 18 mmHg), mild AI, severe LAE, mild RAE, PASP 41   .  S/P MVR (mitral valve replacement)  11/2000   .  Anemia    .  GERD (gastroesophageal reflux disease)    .  ED (erectile dysfunction)    .  SOB (shortness of breath)     Past Surgical History   Procedure  Laterality  Date   .  Coronary artery bypass graft       lef tmammary artery to LAD coronary artery and reversed SVG to distal rt coronary artery   .  Mitral valve replacement       #33 St. Jude mechanical mitral valve prosthesis. Dr. Servando Snare    Family History   Problem  Relation  Age of Onset   .  Heart  disease  Neg Hx      early; 1st degree relatives   .  Diabetes  Mother    .  Hypertension  Father    .  Thyroid disease  Sister    .  Diabetes  Brother    .  Hypertension  Brother    .  Diabetes  Brother    Social History  History   Substance Use Topics   .  Smoking status:  Former Smoker     Types:  Cigars     Quit date:  03/06/1999   .  Smokeless tobacco:  Not on file      Comment: 2005   .  Alcohol Use:  No   No Known Allergies  Current Outpatient Prescriptions   Medication  Sig  Dispense  Refill   .  aspirin 81 MG tablet  Take 81 mg by mouth daily.     .  beta carotene w/minerals (OCUVITE) tablet  Take 1 tablet by mouth daily.     Marland Kitchen  digoxin (LANOXIN) 0.25 MG tablet  Take 125 mcg by mouth daily.     Marland Kitchen  esomeprazole (NEXIUM) 40 MG capsule  Take 40 mg by mouth daily before breakfast.     .  ezetimibe-simvastatin (VYTORIN) 10-20 MG per tablet  Take 1 tablet by mouth daily.     .  furosemide (LASIX) 40 MG tablet  Take 40 mg by mouth daily.     Marland Kitchen  glipiZIDE (GLUCOTROL XL) 10 MG 24 hr tablet  Take 10 mg by mouth daily.     .  metFORMIN (GLUCOPHAGE) 1000 MG tablet  Take 1,000 mg by mouth 2 (two) times daily.     .  metoprolol (TOPROL-XL) 200 MG 24 hr tablet  Take 200 mg by mouth daily.     .  Misc Natural Products (OSTEO BI-FLEX ADV TRIPLE ST) TABS  Take 500 each by mouth 2 (two) times daily.     .  Omega-3 Fatty Acids (FISH OIL) 1000 MG CAPS  Take 1 capsule by mouth 2 (two) times daily.     .  potassium chloride SA (K-DUR,KLOR-CON) 20 MEQ tablet  Take 10 mEq by mouth daily.     .  ramipril (ALTACE) 5 MG capsule  Take 5 mg by mouth 2 (two) times daily.     .  sildenafil (VIAGRA) 100 MG tablet  Take 100 mg by mouth as needed.     .  sitaGLIPtin (JANUVIA) 100 MG tablet  Take 100 mg by mouth daily.     .  Testosterone (ANDROGEL PUMP) 12.5 MG/ACT (1%) GEL  Place onto the skin daily.     Marland Kitchen  warfarin (COUMADIN) 5 MG tablet  Take 5 mg by mouth as directed.     .  insulin glargine (LANTUS)  100 UNIT/ML injection  Inject 80 Units into the skin as directed.      No current facility-administered medications for this visit.   Review of Systems  Review of Systems  Constitutional: Positive for fatigue and unexpected weight change. Negative for fever and chills.  HENT: Negative for hearing loss, congestion, sore throat, trouble swallowing and voice change.  Eyes: Negative for visual disturbance.  Respiratory: Positive for shortness of breath. Negative for cough and wheezing.  Cardiovascular: Negative for chest pain, palpitations and leg swelling.  Gastrointestinal: Positive for abdominal pain. Negative for nausea, vomiting, diarrhea, constipation, blood in stool, abdominal distention, anal bleeding and rectal pain.  Genitourinary: Negative for hematuria and difficulty urinating.  Musculoskeletal: Negative for arthralgias.  Skin: Negative for rash and wound.  Neurological: Negative for seizures, syncope, weakness and headaches.  Hematological: Negative for adenopathy. Bruises/bleeds easily.  Psychiatric/Behavioral: Negative for confusion.  Blood pressure 130/66, pulse 78, temperature 98.6 F (37 C), resp. rate 18, height 6' (1.829 m), weight 249 lb 9.6 oz (113.218 kg).  Physical Exam  Physical Exam  Constitutional: He is oriented to person, place, and time. He appears well-nourished. No distress.  obese  HENT:  Head: Normocephalic and atraumatic.  Right Ear: External ear normal.  Left Ear: External ear normal.  Nose: Nose normal.  Mouth/Throat: Oropharynx is clear and moist.  Eyes: Conjunctivae are normal. Pupils are equal, round, and reactive to light. Right eye exhibits no discharge. Left eye exhibits no discharge. No scleral icterus.  Neck: Normal range of motion. Neck supple. No tracheal deviation present. No thyromegaly present.  Cardiovascular: Intact distal pulses.  Murmur heard. Irregular rhythm  Pulmonary/Chest: Effort normal and breath sounds normal. No respiratory  distress. He  has no wheezes. He has no rales.  Abdominal: Soft. Bowel sounds are normal. He exhibits mass. There is no tenderness. There is no rebound.  There is a firm palpable dominant mass just below and to the left of the midline. The abdomen is nontender. There is rectus diastases  Musculoskeletal: Normal range of motion. He exhibits no edema and no tenderness.  Lymphadenopathy:  He has no cervical adenopathy.  He has no axillary adenopathy.  Right: No inguinal adenopathy present.  Left: No inguinal adenopathy present.  Neurological: He is alert and oriented to person, place, and time.  Skin: Skin is warm and dry. No rash noted. No erythema.  Psychiatric: His behavior is normal. Judgment normal.  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 He has received cardiac clearance and is being taken off coumadin and bridged with lovenox preop.  He has also seen oncology preop.  Port-a-cath insertion is being requested as well.  I discussed the risks of surgery which include but is not limited to bleeding, infection, injury to surrounding structures, need for bowel resection, pneumothorax, the chance the biopsy is non diagnostic, etc.  He agrees to proceed.

## 2013-02-09 ENCOUNTER — Encounter (HOSPITAL_COMMUNITY): Payer: Self-pay | Admitting: Anesthesiology

## 2013-02-09 ENCOUNTER — Ambulatory Visit (HOSPITAL_COMMUNITY): Payer: BC Managed Care – PPO

## 2013-02-09 ENCOUNTER — Encounter (HOSPITAL_COMMUNITY): Payer: Self-pay

## 2013-02-09 ENCOUNTER — Encounter (HOSPITAL_COMMUNITY)
Admission: RE | Disposition: A | Discharge status: Home / Self Care | Payer: Self-pay | Source: Ambulatory Visit | Attending: Surgery

## 2013-02-09 ENCOUNTER — Inpatient Hospital Stay (HOSPITAL_COMMUNITY)
Admission: RE | Admit: 2013-02-09 | Discharge: 2013-02-19 | DRG: 874 | Disposition: A | Discharge status: Home / Self Care | Payer: BC Managed Care – PPO | Source: Ambulatory Visit | Attending: Surgery | Admitting: Surgery

## 2013-02-09 ENCOUNTER — Ambulatory Visit (HOSPITAL_COMMUNITY): Payer: BC Managed Care – PPO | Admitting: Anesthesiology

## 2013-02-09 DIAGNOSIS — C8583 Other specified types of non-Hodgkin lymphoma, intra-abdominal lymph nodes: Secondary | ICD-10-CM

## 2013-02-09 DIAGNOSIS — I10 Essential (primary) hypertension: Secondary | ICD-10-CM | POA: Diagnosis present

## 2013-02-09 DIAGNOSIS — E669 Obesity, unspecified: Secondary | ICD-10-CM | POA: Diagnosis present

## 2013-02-09 DIAGNOSIS — Z7901 Long term (current) use of anticoagulants: Secondary | ICD-10-CM

## 2013-02-09 DIAGNOSIS — D638 Anemia in other chronic diseases classified elsewhere: Secondary | ICD-10-CM | POA: Diagnosis present

## 2013-02-09 DIAGNOSIS — I2510 Atherosclerotic heart disease of native coronary artery without angina pectoris: Secondary | ICD-10-CM | POA: Diagnosis present

## 2013-02-09 DIAGNOSIS — K560 Paralytic ileus: Secondary | ICD-10-CM | POA: Diagnosis not present

## 2013-02-09 DIAGNOSIS — E119 Type 2 diabetes mellitus without complications: Secondary | ICD-10-CM | POA: Diagnosis present

## 2013-02-09 DIAGNOSIS — Z6834 Body mass index (BMI) 34.0-34.9, adult: Secondary | ICD-10-CM

## 2013-02-09 DIAGNOSIS — Z87891 Personal history of nicotine dependence: Secondary | ICD-10-CM

## 2013-02-09 DIAGNOSIS — K929 Disease of digestive system, unspecified: Secondary | ICD-10-CM | POA: Diagnosis not present

## 2013-02-09 DIAGNOSIS — Z954 Presence of other heart-valve replacement: Secondary | ICD-10-CM

## 2013-02-09 DIAGNOSIS — E785 Hyperlipidemia, unspecified: Secondary | ICD-10-CM | POA: Diagnosis present

## 2013-02-09 DIAGNOSIS — I4891 Unspecified atrial fibrillation: Secondary | ICD-10-CM | POA: Diagnosis present

## 2013-02-09 DIAGNOSIS — Y836 Removal of other organ (partial) (total) as the cause of abnormal reaction of the patient, or of later complication, without mention of misadventure at the time of the procedure: Secondary | ICD-10-CM | POA: Diagnosis not present

## 2013-02-09 DIAGNOSIS — K219 Gastro-esophageal reflux disease without esophagitis: Secondary | ICD-10-CM | POA: Diagnosis present

## 2013-02-09 DIAGNOSIS — I428 Other cardiomyopathies: Secondary | ICD-10-CM | POA: Diagnosis present

## 2013-02-09 DIAGNOSIS — Z79899 Other long term (current) drug therapy: Secondary | ICD-10-CM

## 2013-02-09 DIAGNOSIS — Z794 Long term (current) use of insulin: Secondary | ICD-10-CM

## 2013-02-09 DIAGNOSIS — R1900 Intra-abdominal and pelvic swelling, mass and lump, unspecified site: Secondary | ICD-10-CM | POA: Diagnosis present

## 2013-02-09 DIAGNOSIS — Z951 Presence of aortocoronary bypass graft: Secondary | ICD-10-CM

## 2013-02-09 HISTORY — PX: PORTACATH PLACEMENT: SHX2246

## 2013-02-09 HISTORY — PX: LAPAROTOMY: SHX154

## 2013-02-09 LAB — GLUCOSE, CAPILLARY
Glucose-Capillary: 133 mg/dL — ABNORMAL HIGH (ref 70–99)
Glucose-Capillary: 141 mg/dL — ABNORMAL HIGH (ref 70–99)
Glucose-Capillary: 163 mg/dL — ABNORMAL HIGH (ref 70–99)
Glucose-Capillary: 169 mg/dL — ABNORMAL HIGH (ref 70–99)
Glucose-Capillary: 211 mg/dL — ABNORMAL HIGH (ref 70–99)

## 2013-02-09 LAB — TYPE AND SCREEN
ABO/RH(D): A POS
Antibody Screen: NEGATIVE

## 2013-02-09 LAB — PROTIME-INR
INR: 1.17 (ref 0.00–1.49)
Prothrombin Time: 14.7 seconds (ref 11.6–15.2)

## 2013-02-09 SURGERY — LAPAROTOMY, EXPLORATORY
Anesthesia: General | Wound class: Contaminated

## 2013-02-09 MED ORDER — SODIUM CHLORIDE 0.9 % IR SOLN
Status: DC | PRN
  Administered 2013-02-09: 2000 mL

## 2013-02-09 MED ORDER — BUPIVACAINE-EPINEPHRINE PF 0.25-1:200000 % IJ SOLN
INTRAMUSCULAR | Status: AC
  Filled 2013-02-09: qty 30

## 2013-02-09 MED ORDER — DEXTROSE 5 % IV SOLN
2.0000 g | INTRAVENOUS | Status: AC
  Administered 2013-02-09: 2 g via INTRAVENOUS
  Filled 2013-02-09: qty 2

## 2013-02-09 MED ORDER — ENOXAPARIN SODIUM 40 MG/0.4ML ~~LOC~~ SOLN
40.0000 mg | SUBCUTANEOUS | Status: DC
  Administered 2013-02-10 – 2013-02-16 (×7): 40 mg via SUBCUTANEOUS
  Filled 2013-02-09 (×9): qty 0.4

## 2013-02-09 MED ORDER — NEOSTIGMINE METHYLSULFATE 1 MG/ML IJ SOLN
INTRAMUSCULAR | Status: DC | PRN
  Administered 2013-02-09: 5 mg via INTRAVENOUS

## 2013-02-09 MED ORDER — ONDANSETRON HCL 4 MG PO TABS
4.0000 mg | ORAL_TABLET | Freq: Four times a day (QID) | ORAL | Status: DC | PRN
  Administered 2013-02-19: 4 mg via ORAL
  Filled 2013-02-09: qty 1

## 2013-02-09 MED ORDER — SODIUM CHLORIDE 0.9 % IR SOLN
Freq: Once | Status: DC
  Filled 2013-02-09: qty 1.2

## 2013-02-09 MED ORDER — ONDANSETRON HCL 4 MG/2ML IJ SOLN
4.0000 mg | Freq: Four times a day (QID) | INTRAMUSCULAR | Status: DC | PRN
  Administered 2013-02-13 – 2013-02-15 (×2): 4 mg via INTRAVENOUS

## 2013-02-09 MED ORDER — FENTANYL CITRATE 0.05 MG/ML IJ SOLN
INTRAMUSCULAR | Status: DC | PRN
  Administered 2013-02-09: 50 ug via INTRAVENOUS
  Administered 2013-02-09: 100 ug via INTRAVENOUS
  Administered 2013-02-09 (×2): 50 ug via INTRAVENOUS

## 2013-02-09 MED ORDER — NITROGLYCERIN 0.4 MG SL SUBL
0.4000 mg | SUBLINGUAL_TABLET | SUBLINGUAL | Status: DC | PRN
  Filled 2013-02-09: qty 25

## 2013-02-09 MED ORDER — HEPARIN SODIUM (PORCINE) 5000 UNIT/ML IJ SOLN
INTRAMUSCULAR | Status: DC | PRN
  Administered 2013-02-09: 10:00:00

## 2013-02-09 MED ORDER — DIPHENHYDRAMINE HCL 12.5 MG/5ML PO ELIX: 12.5000 mg | ORAL_SOLUTION | Freq: Four times a day (QID) | ORAL | Status: DC | PRN

## 2013-02-09 MED ORDER — HYDROMORPHONE HCL PF 1 MG/ML IJ SOLN
0.2500 mg | INTRAMUSCULAR | Status: DC | PRN
  Administered 2013-02-09 (×2): 0.5 mg via INTRAVENOUS

## 2013-02-09 MED ORDER — HEPARIN SOD (PORK) LOCK FLUSH 100 UNIT/ML IV SOLN
INTRAVENOUS | Status: DC | PRN
  Administered 2013-02-09: 500 [IU] via INTRAVENOUS

## 2013-02-09 MED ORDER — DIPHENHYDRAMINE HCL 50 MG/ML IJ SOLN: 12.5000 mg | Freq: Four times a day (QID) | INTRAMUSCULAR | Status: DC | PRN

## 2013-02-09 MED ORDER — HEPARIN SOD (PORK) LOCK FLUSH 100 UNIT/ML IV SOLN
INTRAVENOUS | Status: AC
  Filled 2013-02-09: qty 5

## 2013-02-09 MED ORDER — DIGOXIN 250 MCG PO TABS
0.2500 mg | ORAL_TABLET | Freq: Every morning | ORAL | Status: DC
  Administered 2013-02-10 – 2013-02-19 (×10): 0.25 mg via ORAL
  Filled 2013-02-09 (×10): qty 1

## 2013-02-09 MED ORDER — PANTOPRAZOLE SODIUM 40 MG IV SOLR
40.0000 mg | INTRAVENOUS | Status: DC
  Administered 2013-02-09 – 2013-02-19 (×11): 40 mg via INTRAVENOUS
  Filled 2013-02-09 (×11): qty 40

## 2013-02-09 MED ORDER — SODIUM CHLORIDE 0.9 % IJ SOLN: 9.0000 mL | INTRAMUSCULAR | Status: DC | PRN

## 2013-02-09 MED ORDER — MIDAZOLAM HCL 5 MG/5ML IJ SOLN
INTRAMUSCULAR | Status: DC | PRN
  Administered 2013-02-09: 2 mg via INTRAVENOUS

## 2013-02-09 MED ORDER — LACTATED RINGERS IV SOLN
INTRAVENOUS | Status: DC | PRN
  Administered 2013-02-09: 09:00:00 via INTRAVENOUS

## 2013-02-09 MED ORDER — HYDROMORPHONE 0.3 MG/ML IV SOLN
INTRAVENOUS | Status: AC
  Filled 2013-02-09: qty 25

## 2013-02-09 MED ORDER — LACTATED RINGERS IV SOLN: INTRAVENOUS | Status: DC

## 2013-02-09 MED ORDER — DEXTROSE 5 % IV SOLN: 2.0000 g | INTRAVENOUS | Status: DC | PRN

## 2013-02-09 MED ORDER — NALOXONE HCL 0.4 MG/ML IJ SOLN: 0.4000 mg | INTRAMUSCULAR | Status: DC | PRN

## 2013-02-09 MED ORDER — INSULIN ASPART 100 UNIT/ML ~~LOC~~ SOLN
0.0000 [IU] | SUBCUTANEOUS | Status: DC
  Administered 2013-02-09 (×2): 3 [IU] via SUBCUTANEOUS
  Administered 2013-02-10 (×3): 5 [IU] via SUBCUTANEOUS
  Administered 2013-02-10: 11 [IU] via SUBCUTANEOUS
  Administered 2013-02-10: 6 [IU] via SUBCUTANEOUS
  Administered 2013-02-10: 8 [IU] via SUBCUTANEOUS
  Administered 2013-02-11: 3 [IU] via SUBCUTANEOUS
  Administered 2013-02-11: 8 [IU] via SUBCUTANEOUS
  Administered 2013-02-11: 5 [IU] via SUBCUTANEOUS
  Administered 2013-02-11: 3 [IU] via SUBCUTANEOUS
  Administered 2013-02-11: 5 [IU] via SUBCUTANEOUS
  Administered 2013-02-11: 3 [IU] via SUBCUTANEOUS
  Administered 2013-02-11 – 2013-02-12 (×2): 5 [IU] via SUBCUTANEOUS
  Administered 2013-02-12: 3 [IU] via SUBCUTANEOUS
  Administered 2013-02-12: 8 [IU] via SUBCUTANEOUS
  Administered 2013-02-12: 3 [IU] via SUBCUTANEOUS
  Administered 2013-02-12: 11 [IU] via SUBCUTANEOUS
  Administered 2013-02-13: 8 [IU] via SUBCUTANEOUS
  Administered 2013-02-13 (×4): 5 [IU] via SUBCUTANEOUS
  Administered 2013-02-13: 8 [IU] via SUBCUTANEOUS
  Administered 2013-02-14: 2 [IU] via SUBCUTANEOUS
  Administered 2013-02-14: 3 [IU] via SUBCUTANEOUS
  Administered 2013-02-14: 5 [IU] via SUBCUTANEOUS

## 2013-02-09 MED ORDER — RAMIPRIL 5 MG PO CAPS
5.0000 mg | ORAL_CAPSULE | Freq: Two times a day (BID) | ORAL | Status: DC
  Administered 2013-02-09: 5 mg via ORAL
  Filled 2013-02-09 (×3): qty 1

## 2013-02-09 MED ORDER — AMLODIPINE BESYLATE 5 MG PO TABS
5.0000 mg | ORAL_TABLET | Freq: Every day | ORAL | Status: DC
  Administered 2013-02-10 – 2013-02-19 (×10): 5 mg via ORAL
  Filled 2013-02-09 (×11): qty 1

## 2013-02-09 MED ORDER — HYDROMORPHONE HCL PF 1 MG/ML IJ SOLN
INTRAMUSCULAR | Status: DC | PRN
  Administered 2013-02-09: 1 mg via INTRAVENOUS

## 2013-02-09 MED ORDER — ROCURONIUM BROMIDE 100 MG/10ML IV SOLN
INTRAVENOUS | Status: DC | PRN
  Administered 2013-02-09: 10 mg via INTRAVENOUS
  Administered 2013-02-09: 50 mg via INTRAVENOUS

## 2013-02-09 MED ORDER — HYDROMORPHONE 0.3 MG/ML IV SOLN
INTRAVENOUS | Status: DC
  Administered 2013-02-09: 1.5 mg via INTRAVENOUS
  Administered 2013-02-09 (×2): via INTRAVENOUS
  Administered 2013-02-09: 2.7 mg via INTRAVENOUS
  Administered 2013-02-10: 1.8 mg via INTRAVENOUS
  Administered 2013-02-10: 2.1 mg via INTRAVENOUS
  Administered 2013-02-10: 1.2 mg via INTRAVENOUS
  Administered 2013-02-10: 12:00:00 via INTRAVENOUS
  Administered 2013-02-10: 1.43 mg via INTRAVENOUS
  Administered 2013-02-11: 0.3 mg via INTRAVENOUS
  Administered 2013-02-11 (×2): via INTRAVENOUS
  Administered 2013-02-11: 5 mg via INTRAVENOUS
  Administered 2013-02-11: 3 mg via INTRAVENOUS
  Administered 2013-02-11: 1.5 mg via INTRAVENOUS
  Administered 2013-02-11: 1.2 mg via INTRAVENOUS
  Administered 2013-02-11: 2 mg via INTRAVENOUS
  Administered 2013-02-12: 2.01 mg via INTRAVENOUS
  Administered 2013-02-12: 3.3 mg via INTRAVENOUS
  Administered 2013-02-12: 2.1 mg via INTRAVENOUS
  Administered 2013-02-12: 1.2 mg via INTRAVENOUS
  Administered 2013-02-13: 0.9 mg via INTRAVENOUS
  Administered 2013-02-13: 1.8 mg via INTRAVENOUS
  Administered 2013-02-13: 4.05 mg via INTRAVENOUS
  Administered 2013-02-13: 2.7 mg via INTRAVENOUS
  Administered 2013-02-13: 5.4 mg via INTRAVENOUS
  Administered 2013-02-13: 5.1 mg via INTRAVENOUS
  Administered 2013-02-13: 01:00:00 via INTRAVENOUS
  Administered 2013-02-13: 2.4 mg via INTRAVENOUS
  Administered 2013-02-14 (×2): via INTRAVENOUS
  Administered 2013-02-14: 5.7 mg via INTRAVENOUS
  Administered 2013-02-14: 0.6 mg via INTRAVENOUS
  Administered 2013-02-14: 9.3 mg via INTRAVENOUS
  Administered 2013-02-14: 3.3 mg via INTRAVENOUS
  Administered 2013-02-14: 4.5 mg via INTRAVENOUS
  Administered 2013-02-15: 19:00:00 via INTRAVENOUS
  Administered 2013-02-15: 10.5 mg via INTRAVENOUS
  Administered 2013-02-15: 5.7 mg via INTRAVENOUS
  Administered 2013-02-15: 3.81 mg via INTRAVENOUS
  Administered 2013-02-15: 3.3 mg via INTRAVENOUS
  Administered 2013-02-15: 3 mg via INTRAVENOUS
  Administered 2013-02-15: 10:00:00 via INTRAVENOUS
  Administered 2013-02-16: 2.63 mg via INTRAVENOUS
  Administered 2013-02-16 (×2): 3.3 mg via INTRAVENOUS
  Administered 2013-02-16: 2.4 mg via INTRAVENOUS
  Administered 2013-02-16: 2.7 mg via INTRAVENOUS
  Administered 2013-02-16: 17:00:00 via INTRAVENOUS
  Administered 2013-02-16: 2.1 mg via INTRAVENOUS
  Administered 2013-02-17: 3.7 mg via INTRAVENOUS
  Administered 2013-02-17: 1.75 mg via INTRAVENOUS
  Administered 2013-02-17: 2.1 mg via INTRAVENOUS
  Administered 2013-02-17: 0.9 mg via INTRAVENOUS
  Administered 2013-02-17: 1.2 mg via INTRAVENOUS
  Administered 2013-02-17: 1.5 mg via INTRAVENOUS
  Administered 2013-02-18: 1.8 mg via INTRAVENOUS
  Filled 2013-02-09 (×17): qty 25

## 2013-02-09 MED ORDER — DEXTROSE 5 % IV SOLN
2.0000 g | Freq: Four times a day (QID) | INTRAVENOUS | Status: DC
  Administered 2013-02-09 – 2013-02-17 (×31): 2 g via INTRAVENOUS
  Filled 2013-02-09 (×34): qty 2

## 2013-02-09 MED ORDER — BUPIVACAINE-EPINEPHRINE 0.25% -1:200000 IJ SOLN
INTRAMUSCULAR | Status: DC | PRN
  Administered 2013-02-09: 5 mL

## 2013-02-09 MED ORDER — POTASSIUM CHLORIDE IN NACL 20-0.9 MEQ/L-% IV SOLN
INTRAVENOUS | Status: DC
  Administered 2013-02-09 – 2013-02-10 (×3): via INTRAVENOUS
  Filled 2013-02-09 (×4): qty 1000

## 2013-02-09 MED ORDER — METOPROLOL SUCCINATE ER 100 MG PO TB24
200.0000 mg | ORAL_TABLET | Freq: Every day | ORAL | Status: DC
  Administered 2013-02-10 – 2013-02-19 (×10): 200 mg via ORAL
  Filled 2013-02-09 (×10): qty 2

## 2013-02-09 MED ORDER — LIDOCAINE HCL (CARDIAC) 20 MG/ML IV SOLN
INTRAVENOUS | Status: DC | PRN
  Administered 2013-02-09: 50 mg via INTRAVENOUS

## 2013-02-09 MED ORDER — PROPOFOL 10 MG/ML IV BOLUS
INTRAVENOUS | Status: DC | PRN
  Administered 2013-02-09: 200 mg via INTRAVENOUS

## 2013-02-09 MED ORDER — CEFOXITIN SODIUM-DEXTROSE 1-4 GM-% IV SOLR (PREMIX)
INTRAVENOUS | Status: AC
  Filled 2013-02-09: qty 100

## 2013-02-09 MED ORDER — POTASSIUM CHLORIDE IN NACL 20-0.9 MEQ/L-% IV SOLN
INTRAVENOUS | Status: AC
  Filled 2013-02-09: qty 1000

## 2013-02-09 MED ORDER — HYDROMORPHONE HCL PF 1 MG/ML IJ SOLN
INTRAMUSCULAR | Status: AC
  Filled 2013-02-09: qty 1

## 2013-02-09 MED ORDER — GLYCOPYRROLATE 0.2 MG/ML IJ SOLN
INTRAMUSCULAR | Status: DC | PRN
  Administered 2013-02-09: .8 mg via INTRAVENOUS

## 2013-02-09 MED ORDER — ONDANSETRON HCL 4 MG/2ML IJ SOLN
4.0000 mg | Freq: Four times a day (QID) | INTRAMUSCULAR | Status: DC | PRN
  Administered 2013-02-14: 4 mg via INTRAVENOUS
  Filled 2013-02-09 (×3): qty 2

## 2013-02-09 SURGICAL SUPPLY — 60 items
ADH SKN CLS APL DERMABOND .7 (GAUZE/BANDAGES/DRESSINGS)
APL SKNCLS STERI-STRIP NONHPOA (GAUZE/BANDAGES/DRESSINGS)
BAG DECANTER FOR FLEXI CONT (MISCELLANEOUS) ×2 IMPLANT
BENZOIN TINCTURE PRP APPL 2/3 (GAUZE/BANDAGES/DRESSINGS) IMPLANT
BLADE HEX COATED 2.75 (ELECTRODE) ×2 IMPLANT
BLADE SURG 15 STRL LF DISP TIS (BLADE) ×1 IMPLANT
BLADE SURG 15 STRL SS (BLADE) ×2
BLADE SURG SZ11 CARB STEEL (BLADE) ×2 IMPLANT
CANISTER SUCTION 2500CC (MISCELLANEOUS) IMPLANT
CHLORAPREP W/TINT 10.5 ML (MISCELLANEOUS) ×1 IMPLANT
CHLORAPREP W/TINT 26ML (MISCELLANEOUS) ×2 IMPLANT
CLOTH BEACON ORANGE TIMEOUT ST (SAFETY) ×2 IMPLANT
DECANTER SPIKE VIAL GLASS SM (MISCELLANEOUS) ×1 IMPLANT
DERMABOND ADVANCED (GAUZE/BANDAGES/DRESSINGS)
DERMABOND ADVANCED .7 DNX12 (GAUZE/BANDAGES/DRESSINGS) IMPLANT
DRAIN CHANNEL RND F F (WOUND CARE) ×1 IMPLANT
DRAPE C-ARM 42X120 X-RAY (DRAPES) ×1 IMPLANT
DRAPE LAPAROSCOPIC ABDOMINAL (DRAPES) ×2 IMPLANT
DRAPE UTILITY XL STRL (DRAPES) ×1 IMPLANT
DRSG TEGADERM 2-3/8X2-3/4 SM (GAUZE/BANDAGES/DRESSINGS) IMPLANT
DRSG TEGADERM 4X4.75 (GAUZE/BANDAGES/DRESSINGS) ×1 IMPLANT
ELECT REM PT RETURN 9FT ADLT (ELECTROSURGICAL) ×2
ELECTRODE REM PT RTRN 9FT ADLT (ELECTROSURGICAL) ×1 IMPLANT
GAUZE SPONGE 4X4 16PLY XRAY LF (GAUZE/BANDAGES/DRESSINGS) ×2 IMPLANT
GLOVE BIO SURGEON STRL SZ7 (GLOVE) ×5 IMPLANT
GLOVE BIOGEL PI IND STRL 7.0 (GLOVE) ×1 IMPLANT
GLOVE BIOGEL PI INDICATOR 7.0 (GLOVE) ×1
GLOVE SURG SIGNA 7.5 PF LTX (GLOVE) ×3 IMPLANT
GOWN STRL NON-REIN LRG LVL3 (GOWN DISPOSABLE) ×1 IMPLANT
GOWN STRL REIN XL XLG (GOWN DISPOSABLE) ×7 IMPLANT
HAND ACTIVATED (MISCELLANEOUS) IMPLANT
KIT BASIN OR (CUSTOM PROCEDURE TRAY) ×3 IMPLANT
KIT PORT POWER 8FR ISP CVUE (Catheter) ×1 IMPLANT
KIT PORT POWER ISP 8FR (Catheter) IMPLANT
KIT POWER CATH 8FR (Catheter) IMPLANT
LIGASURE IMPACT 36 18CM CVD LR (INSTRUMENTS) ×1 IMPLANT
NDL HYPO 25X1 1.5 SAFETY (NEEDLE) ×1 IMPLANT
NEEDLE HYPO 25X1 1.5 SAFETY (NEEDLE) ×2 IMPLANT
NS IRRIG 1000ML POUR BTL (IV SOLUTION) ×2 IMPLANT
PACK BASIC VI WITH GOWN DISP (CUSTOM PROCEDURE TRAY) ×2 IMPLANT
PACK GENERAL/GYN (CUSTOM PROCEDURE TRAY) ×2 IMPLANT
PENCIL BUTTON HOLSTER BLD 10FT (ELECTRODE) ×2 IMPLANT
RELOAD PROXIMATE 75MM BLUE (ENDOMECHANICALS) ×4 IMPLANT
RELOAD STAPLE 75 3.8 BLU REG (ENDOMECHANICALS) IMPLANT
SPONGE GAUZE 4X4 12PLY (GAUZE/BANDAGES/DRESSINGS) ×1 IMPLANT
STAPLER GUN LINEAR PROX 60 (STAPLE) ×1 IMPLANT
STAPLER PROXIMATE 75MM BLUE (STAPLE) ×1 IMPLANT
STAPLER VISISTAT 35W (STAPLE) ×2 IMPLANT
STRIP CLOSURE SKIN 1/2X4 (GAUZE/BANDAGES/DRESSINGS) ×1 IMPLANT
SUT MNCRL AB 4-0 PS2 18 (SUTURE) ×2 IMPLANT
SUT PDS AB 1 TP1 96 (SUTURE) ×2 IMPLANT
SUT PROLENE 2 0 SH DA (SUTURE) ×2 IMPLANT
SUT SILK 2 0 (SUTURE)
SUT SILK 2-0 30XBRD TIE 12 (SUTURE) IMPLANT
SUT VIC AB 3-0 SH 27 (SUTURE) ×2
SUT VIC AB 3-0 SH 27XBRD (SUTURE) ×1 IMPLANT
SYR CONTROL 10ML LL (SYRINGE) ×2 IMPLANT
SYRINGE 10CC LL (SYRINGE) ×2 IMPLANT
TOWEL OR 17X26 10 PK STRL BLUE (TOWEL DISPOSABLE) ×4 IMPLANT
TRAY FOLEY CATH 14FRSI W/METER (CATHETERS) ×2 IMPLANT

## 2013-02-09 NOTE — Transfer of Care (Signed)
Immediate Anesthesia Transfer of Care Note  Patient: Sacha L Chaudhary  Procedure(s) Performed: Procedure(s) (LRB): EXPLORATORY LAPAROTOMY WITH incisional biopsy intra- ABDOMINAL MASS ILOCECTOMY  (N/A) INSERTION PORT-A-CATH (N/A)  Patient Location: PACU  Anesthesia Type: General  Level of Consciousness: sedated, patient cooperative and responds to stimulaton  Airway & Oxygen Therapy: Patient Spontanous Breathing and Patient connected to face mask oxgen  Post-op Assessment: Report given to PACU RN and Post -op Vital signs reviewed and stable  Post vital signs: Reviewed and stable  Complications: No apparent anesthesia complications

## 2013-02-09 NOTE — Progress Notes (Signed)
X-ray results noted 

## 2013-02-09 NOTE — Anesthesia Preprocedure Evaluation (Addendum)
Anesthesia Evaluation  Patient identified by MRN, date of birth, ID band Patient awake    Reviewed: Allergy & Precautions, H&P , NPO status , Patient's Chart, lab work & pertinent test results, reviewed documented beta blocker date and time   Airway Mallampati: II TM Distance: >3 FB Neck ROM: full    Dental  (+) Caps, Dental Advisory Given and Chipped Cap left upper front lateral.  Small chip upper front left.:   Pulmonary shortness of breath,  breath sounds clear to auscultation  Pulmonary exam normal       Cardiovascular hypertension, Pt. on home beta blockers + CAD and + CABG + dysrhythmias Atrial Fibrillation Rhythm:Irregular Rate:Normal  11/13 echo EF 55%.  11/13 myoview small amt. Ischemia and old scar.  MVR 2002. ECG AF   Neuro/Psych negative neurological ROS  negative psych ROS   GI/Hepatic negative GI ROS, Neg liver ROS, GERD-  Medicated and Controlled,  Endo/Other  diabetes, Well Controlled, Type 2, Oral Hypoglycemic Agents  Renal/GU negative Renal ROS  negative genitourinary   Musculoskeletal   Abdominal   Peds  Hematology  (+) Blood dyscrasia, anemia , Hgb. 9   Anesthesia Other Findings   Reproductive/Obstetrics negative OB ROS                          Anesthesia Physical Anesthesia Plan  ASA: III  Anesthesia Plan: General   Post-op Pain Management:    Induction: Intravenous  Airway Management Planned: Oral ETT  Additional Equipment:   Intra-op Plan:   Post-operative Plan: Extubation in OR  Informed Consent: I have reviewed the patients History and Physical, chart, labs and discussed the procedure including the risks, benefits and alternatives for the proposed anesthesia with the patient or authorized representative who has indicated his/her understanding and acceptance.   Dental Advisory Given  Plan Discussed with: CRNA and Surgeon  Anesthesia Plan Comments:          Anesthesia Quick Evaluation

## 2013-02-09 NOTE — Interval H&P Note (Signed)
History and Physical Interval Note: no change in H and P  02/09/2013 8:45 AM  John [name]  has presented today for surgery, with the diagnosis of ABDOMINAL MASS  The various methods of treatment have been discussed with the patient and family. After consideration of risks, benefits and other options for treatment, the patient has consented to  Procedure(s): EXPLORATORY LAPAROTOMY WITH incisional biopsy intra- ABDOMINAL MASS (N/A) INSERTION PORT-A-CATH (N/A) as a surgical intervention .  The patient's history has been reviewed, patient examined, no change in status, stable for surgery.  I have reviewed the patient's chart and labs.  Questions were answered to the patient's satisfaction.     Daymien Goth A

## 2013-02-09 NOTE — Anesthesia Postprocedure Evaluation (Signed)
  Anesthesia Post-op Note  Patient: John [name]  Procedure(s) Performed: Procedure(s) (LRB): EXPLORATORY LAPAROTOMY WITH incisional biopsy intra- ABDOMINAL MASS ILOCECTOMY  (N/A) INSERTION PORT-A-CATH (N/A)  Patient Location: PACU  Anesthesia Type: General  Level of Consciousness: awake and alert   Airway and Oxygen Therapy: Patient Spontanous Breathing  Post-op Pain: mild  Post-op Assessment: Post-op Vital signs reviewed, Patient's Cardiovascular Status Stable, Respiratory Function Stable, Patent Airway and No signs of Nausea or vomiting  Last Vitals:  Filed Vitals:   02/09/13 1200  BP: 129/51  Pulse: 67  Temp:   Resp: 17    Post-op Vital Signs: stable   Complications: No apparent anesthesia complications

## 2013-02-09 NOTE — Op Note (Signed)
EXPLORATORY LAPAROTOMY WITH incisional biopsy intra- ABDOMINAL MASS ILOCECTOMY , INSERTION PORT-A-CATH  Procedure Note  John [name] 02/09/2013   Pre-op Diagnosis: ABDOMINAL MASS     Post-op Diagnosis: same  Procedure(s): EXPLORATORY LAPAROTOMY WITH incisional biopsy intra- ABDOMINAL MASS ILOCECTOMY  INSERTION PORT-A-CATH (8Fr clear view port left subclavian vein)  Surgeon(s): Harl Bowie, MD  Anesthesia: General  Staff:  Circulator: Alvira Monday, RN Radiology Technologist: Monia Sabal Relief Circulator: Kipp Laurence, RN Relief Scrub: Regan Lemming, CST Scrub Person: Joellen Jersey, CST; Misty M Tuttle, CST  Estimated Blood Loss: less than 100 mL               Specimens: sent to path          [facility]   Date: 02/09/2013  Time: 11:33 AM

## 2013-02-09 NOTE — Progress Notes (Signed)
Portable upright chest x-ray done. 

## 2013-02-10 ENCOUNTER — Encounter (HOSPITAL_COMMUNITY): Payer: Self-pay | Admitting: Surgery

## 2013-02-10 ENCOUNTER — Other Ambulatory Visit: Payer: BC Managed Care – PPO | Admitting: Lab

## 2013-02-10 ENCOUNTER — Ambulatory Visit: Payer: BC Managed Care – PPO | Admitting: Oncology

## 2013-02-10 ENCOUNTER — Other Ambulatory Visit: Payer: Self-pay

## 2013-02-10 LAB — GLUCOSE, CAPILLARY
Glucose-Capillary: 236 mg/dL — ABNORMAL HIGH (ref 70–99)
Glucose-Capillary: 317 mg/dL — ABNORMAL HIGH (ref 70–99)
Glucose-Capillary: 268 mg/dL — ABNORMAL HIGH (ref 70–99)
Glucose-Capillary: 265 mg/dL — ABNORMAL HIGH (ref 70–99)
Glucose-Capillary: 234 mg/dL — ABNORMAL HIGH (ref 70–99)

## 2013-02-10 LAB — CBC
HCT: 34.5 % — ABNORMAL LOW (ref 39.0–52.0)
Hemoglobin: 10.6 g/dL — ABNORMAL LOW (ref 13.0–17.0)
MCH: 22.2 pg — ABNORMAL LOW (ref 26.0–34.0)
MCHC: 30.7 g/dL (ref 30.0–36.0)
MCV: 72.3 fL — ABNORMAL LOW (ref 78.0–100.0)
Platelets: 287 10*3/uL (ref 150–400)
RBC: 4.77 MIL/uL (ref 4.22–5.81)
RDW: 18.7 % — ABNORMAL HIGH (ref 11.5–15.5)
WBC: 12.2 10*3/uL — ABNORMAL HIGH (ref 4.0–10.5)

## 2013-02-10 LAB — BASIC METABOLIC PANEL
BUN: 27 mg/dL — ABNORMAL HIGH (ref 6–23)
BUN: 29 mg/dL — ABNORMAL HIGH (ref 6–23)
CO2: 25 mEq/L (ref 19–32)
CO2: 26 mEq/L (ref 19–32)
Calcium: 7.5 mg/dL — ABNORMAL LOW (ref 8.4–10.5)
Calcium: 7.4 mg/dL — ABNORMAL LOW (ref 8.4–10.5)
Chloride: 102 mEq/L (ref 96–112)
Chloride: 103 mEq/L (ref 96–112)
Creatinine, Ser: 1.59 mg/dL — ABNORMAL HIGH (ref 0.50–1.35)
Creatinine, Ser: 1.54 mg/dL — ABNORMAL HIGH (ref 0.50–1.35)
GFR calc Af Amer: 53 mL/min — ABNORMAL LOW (ref >90)
GFR calc Af Amer: 55 mL/min — ABNORMAL LOW (ref >90)
GFR calc non Af Amer: 46 mL/min — ABNORMAL LOW (ref >90)
GFR calc non Af Amer: 48 mL/min — ABNORMAL LOW (ref >90)
Glucose, Bld: 285 mg/dL — ABNORMAL HIGH (ref 70–99)
Glucose, Bld: 296 mg/dL — ABNORMAL HIGH (ref 70–99)
Potassium: 7 mEq/L (ref 3.5–5.1)
Potassium: 5.7 mEq/L — ABNORMAL HIGH (ref 3.5–5.1)
Sodium: 136 mEq/L (ref 135–145)
Sodium: 137 mEq/L (ref 135–145)

## 2013-02-10 LAB — POTASSIUM: Potassium: 6.5 mEq/L (ref 3.5–5.1)

## 2013-02-10 MED ORDER — INSULIN ASPART 100 UNIT/ML ~~LOC~~ SOLN
5.0000 [IU] | Freq: Once | SUBCUTANEOUS | Status: AC
  Administered 2013-02-10: 5 [IU] via SUBCUTANEOUS

## 2013-02-10 MED ORDER — SODIUM CHLORIDE 0.9 % IV BOLUS (SEPSIS)
1000.0000 mL | Freq: Once | INTRAVENOUS | Status: AC
  Administered 2013-02-10 (×2): 1000 mL via INTRAVENOUS

## 2013-02-10 MED ORDER — DEXTROSE 50 % IV SOLN
1.0000 | Freq: Once | INTRAVENOUS | Status: AC
  Administered 2013-02-10: 50 mL via INTRAVENOUS
  Filled 2013-02-10: qty 50

## 2013-02-10 MED ORDER — SODIUM CHLORIDE 0.9 % IV SOLN
INTRAVENOUS | Status: DC
  Administered 2013-02-10 – 2013-02-16 (×13): via INTRAVENOUS
  Administered 2013-02-16: 125 mL via INTRAVENOUS
  Administered 2013-02-16 – 2013-02-18 (×4): via INTRAVENOUS

## 2013-02-10 MED ORDER — SODIUM CHLORIDE 0.9 % IV BOLUS (SEPSIS)
500.0000 mL | Freq: Once | INTRAVENOUS | Status: AC
  Administered 2013-02-10: 500 mL via INTRAVENOUS

## 2013-02-10 NOTE — Progress Notes (Signed)
CRITICAL VALUE ALERT  Critical value received:  Potassium: 7.0  Date of notification:  02/10/2013  Time of notification:  0615  Critical value read back:yes  Nurse who received alert:  Laurell Roof, RN  MD notified (1st page):  Dr. Marlou Starks  Time of first page:  [phone]  MD notified (2nd page):  Time of second page:  Responding MD:  Dr. Marlou Starks  Time MD responded:  [phone]

## 2013-02-10 NOTE — Progress Notes (Signed)
Dr. Marlou Starks responded to critical lab value.  Orders for stat ekg, tele monitoring, amp of D50 and 5 units of insulin  John [name]

## 2013-02-10 NOTE — Op Note (Signed)
John [name] NO.:  [id number]  MEDICAL RECORD NO.:  [id number]  LOCATION:  [id number]                         FACILITY:  [facility]  PHYSICIAN:  Coralie Keens, M.D. 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  PROCEDURES: 1. An 8-French left subclavian vein Port-A-Cath insertion. 2. Exploratory laparotomy with excision of intra-abdominal mass and     ileocecectomy.  SURGEON:  Coralie Keens, M.D.  ANESTHESIA:  General.  ESTIMATED BLOOD LOSS:  100 mL.  INDICATION:  John [name] is a 62 year old gentleman who presents with anemia.  He was found on a CAT scan of the abdomen and pelvis to have a large intra-abdominal mass arising from what appeared to be a retroperitoneum.  It appeared consistent with a lymphoma.  Preoperative colonoscopy was unremarkable.  FINDINGS:  The patient was found have a large mass eroding into the cecum.  It appeared to be coming from the terminal ileum.  There was a large amount of fat creeping in the distal half small bowel.  This mass is also fixated to the sigmoid colon but I was able to remove it from the sigmoid colon.  Several peritoneal nodules were excised.  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  The general appearance of the mass itself appeared that it could be either adenocarcinoma or lymphoma.  PROCEDURE IN DETAIL:  The patient was brought to the operating room, identified as John [name].  He was placed supine on the operating table.  General anesthesia was induced.  His left neck and chest were then prepped and draped in usual sterile fashion.  Using an introducer needle, I was able to easily cannulate the left subclavian vein.  I passed a wire through the introducer  needle into the central venous system under direct fluoroscopy.  I anesthetized the skin further.  I made a skin incision longitudinally on the chest on the left side incorporating the localization wire.  I then created a pocket for the port.  An 8-French clear view Port-A-Cath was then brought to the field. The port fit nicely into the pocket.  I then passed the introducer sheath and venous dilator over the wire and into the central venous system.  I then removed the dilator and guidewire.  The catheter was attached to the port and flushed with heparinized saline.  I cut the catheter appropriate length.  I then placed the port into the pocket and fed the catheter down the sheath.  The sheath was then peeled away leaving the catheter in the central venous system.  I had good placement appeared to be achieved after manipulation of the catheter several times in the superior vena cava.  I then sutured the port to the chest wall. I accessed the port and good flush return were demonstrated.  I then instilled concentrated heparin solution into the port and catheter.  I then closed subcutaneous tissue with interrupted 3-0 Vicryl and closed the skin with a running 4-0 Monocryl.  Steri-Strips, gauze, and Tegaderm were then  applied.  The patient's abdomen was then prepped and draped in usual sterile fashion.  He had a palpable mass in the midline.  I created a lower midline incision from the umbilicus down with a scalpel.  I took this down to the peritoneum with electrocautery.  Upon entering the abdomen, patient had a large intra-abdominal mass which appeared fixated slightly to the abdominal wall as well as small bowel and colon.  I opened up the mass slightly and a large amount of stool came from the mass.  At this point, I decided to crease my incision above the umbilicus, then further down toward the pubis.  The large mass was then easily identified.  It was not coming from the  retroperitoneum and just appeared to be in the mesentery of small bowel eroding into the cecum.  It was also fixated to the descending colon at a sigmoid loop.  I was able to excise it from the sigmoid colon without entering the sigmoid colon.  I then elevated easier.  I eviscerated entire small bowel ran from the ligament of Treitz to the area of the mass.  There was large amount of fat creeping in the distal small bowel.  The mass appeared to have eroded into either the cecum or terminal ileum.  It is difficult to tell but there is a very large abscess full stool.  I transected the small bowel proximally with the GIA-75 stapler.  I then mobilized the right colon, the white line of Toldt, and transected the ascending colon with a GIA-75 stapler as well.  I then took down the mesentery with the ligature.  The entire mass including the cecum and terminal ileum were then excised and sent to pathology for evaluation.  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  At this point, I reapproximated the small bowel to the remaining ascending colon in a side-to-side fashion with silk sutures.  I created a colotomy and enterotomy with electrocautery and then performed a side-to-side anastomosis with a single firing of the GIA-75 stapler.  The open end was then closed with TA-60 stapler.  An adequate well-perfused anastomosis appeared to be achieved.  I then reinforced the suture line with silk sutures.  I then thoroughly irrigated the abdomen with several liters of normal saline.  Hemostasis appeared to be achieved.  I closed a small serosal defect in the sigmoid colon with interrupted silk sutures.  I made a separate skin incision and placed a 19-French Blake drain through a skin incision in the right lower quadrant and into the area of the resection.  I then closed the patient's midline fascia with running #1 looped PDS suture.  The skin was then  irrigated and packed with wet-to-dry gauze.  The patient tolerated the procedure well.  All counts were correct at the end of procedure.  The patient was then extubated in the operating room and taken in stable condition to recovery room.     Coralie Keens, M.D.     DB/MEDQ  D:  02/09/2013  T:  02/10/2013  Job:  [id number]

## 2013-02-10 NOTE — Care Management Note (Addendum)
    Page 1 of 2   02/12/2013     1:29:38 PM   CARE MANAGEMENT NOTE 02/12/2013  Patient:  John [name], John [name]   Account Number:  [id number]  Date Initiated:  02/10/2013  Documentation initiated by:  Dessa Phi  Subjective/Objective Assessment:   ADMITTED W/ABD MASS.     Action/Plan:   FROM HOME.   Anticipated DC Date:  02/13/2013   Anticipated DC Plan:  [facility] [facility]  CM consult      [facility] Choice  HOME HEALTH  DURABLE MEDICAL EQUIPMENT   Choice offered to / List presented to:  C-1 Patient   DME arranged  VAC        HH arranged  HH-1 RN      [place].   Status of service:  In process, will continue to follow Medicare Important Message given?   (If response is "NO", the following Medicare IM given date fields will be blank) Date Medicare IM given:   Date Additional Medicare IM given:    Discharge Disposition:  [place]  Per UR Regulation:  Reviewed for med. necessity/level of care/duration of stay  If discussed at [place] of Stay Meetings, dates discussed:    Comments:  02-12-13 [place] [phone] Spoke with patient and wife at bedside. Discussed likely need for [facility] services at d/c. Benefits check revealed 2 providers in network with insurance, [facility] and [facility]. Patient chose [facility], contacted them to follow for d/c needs and possible wound vac. Need insurance auth for wound vac, attending notified.  02/11/13 KATHY MAHABIR RN,BSN NCM [phone] TRANSFER TO 5W.POD#2 EXP LAP,ILEOCECTOMY.D/C NGT,WOUND VAC,NO FLATUS,WBC-14.6,IVF,IV ABX,PCA.  02/10/13 KATHY MAHABIR RN,BSN NCM [phone] POD#1 EXP LAP,ILEOCECTOMY,PCA,NGT.

## 2013-02-10 NOTE — Progress Notes (Signed)
1 Day Post-Op  Subjective: No complaints Moved to tele secondary to high K  Objective: Vital signs in last 24 hours: Temp:  [97.5 F (36.4 C)-99.1 F (37.3 C)] 98.1 F (36.7 C) (06/03 0815) Pulse Rate:  [59-109] 59 (06/03 0815) Resp:  [14-22] 22 (06/03 0736) BP: (107-148)/(45-75) 115/48 mmHg (06/03 0815) SpO2:  [92 %-100 %] 94 % (06/03 0815) Weight:  [252 lb 0.7 oz (114.325 kg)] 252 lb 0.7 oz (114.325 kg) (06/02 1343)    Intake/Output from previous day: 06/02 0701 - 06/03 0700 In: 3318.8 [I.V.:3168.8; IV Piggyback:150] Out: [id number] [Urine:1375; Emesis/NG output:10; Drains:380] Intake/Output this shift:   Lungs clear CV RRR Abdomen soft  Lab Results:   Recent Labs  02/10/13 0442  WBC 12.2*  HGB 10.6*  HCT 34.5*  PLT 287   BMET  Recent Labs  02/10/13 0442 02/10/13 0630  NA 136  --   K 7.0* 6.5*  CL 102  --   CO2 25  --   GLUCOSE 285*  --   BUN 27*  --   CREATININE 1.59*  --   CALCIUM 7.5*  --    PT/INR  Recent Labs  02/09/13 0800  LABPROT 14.7  INR 1.17   ABG No results found for this basename: PHART, PCO2, PO2, HCO3,  in the last 72 hours  Studies/Results: Dg Chest Port 1 View  02/09/2013   *RADIOLOGY REPORT*  Clinical Data: Status post Port-A-Cath placement.  PORTABLE CHEST - 1 VIEW  Comparison: PA and lateral chest 09/29/2011.  Findings: Left subclavian Port-A-Cath is in place with the tip projecting over the mid superior vena cava.  No pneumothorax identified.  Lung volumes are low with some basilar atelectasis. There is mild cardiomegaly.  No pleural fluid.  IMPRESSION: Tip of Port-A-Cath projects over the mid superior vena cava.  No pneumothorax or other acute finding.   Original Report Authenticated By: Orlean Patten, M.D.   Dg C-arm 1-60 Min-no Report  02/09/2013   CLINICAL DATA: portacath   C-ARM 1-60 MINUTES  Fluoroscopy was utilized by the requesting physician.  No radiographic  interpretation.     Anti-infectives: Anti-infectives   Start     Dose/Rate Route Frequency Ordered Stop   02/09/13 1600  cefOXitin (MEFOXIN) 2 g in dextrose 5 % 50 mL IVPB     2 g 100 mL/hr over 30 Minutes Intravenous Every 6 hours 02/09/13 1411     02/09/13 0800  cefOXitin (MEFOXIN) 2 g in dextrose 5 % 50 mL IVPB     2 g 100 mL/hr over 30 Minutes Intravenous On call to O.R. 02/09/13 [id number] 02/09/13 0952      Assessment/Plan: s/p Procedure(s): EXPLORATORY LAPAROTOMY WITH incisional biopsy intra- ABDOMINAL MASS ILOCECTOMY  (N/A) INSERTION PORT-A-CATH (N/A)   Monitor K+ Keep NPO with NG  LOS: 1 day    Dora Simeone A 02/10/2013

## 2013-02-11 LAB — BASIC METABOLIC PANEL
BUN: 29 mg/dL — ABNORMAL HIGH (ref 6–23)
CO2: 25 mEq/L (ref 19–32)
Calcium: 7.6 mg/dL — ABNORMAL LOW (ref 8.4–10.5)
Chloride: 104 mEq/L (ref 96–112)
Creatinine, Ser: 1.22 mg/dL (ref 0.50–1.35)
GFR calc Af Amer: 73 mL/min — ABNORMAL LOW (ref >90)
GFR calc non Af Amer: 63 mL/min — ABNORMAL LOW (ref >90)
Glucose, Bld: 250 mg/dL — ABNORMAL HIGH (ref 70–99)
Potassium: 5.2 mEq/L — ABNORMAL HIGH (ref 3.5–5.1)
Sodium: 137 mEq/L (ref 135–145)

## 2013-02-11 LAB — CBC
HCT: 29 % — ABNORMAL LOW (ref 39.0–52.0)
Hemoglobin: 8.7 g/dL — ABNORMAL LOW (ref 13.0–17.0)
MCH: 21.8 pg — ABNORMAL LOW (ref 26.0–34.0)
MCHC: 29.7 g/dL — ABNORMAL LOW (ref 30.0–36.0)
MCV: 73.4 fL — ABNORMAL LOW (ref 78.0–100.0)
Platelets: 250 10*3/uL (ref 150–400)
RBC: 3.95 MIL/uL — ABNORMAL LOW (ref 4.22–5.81)
RDW: 19.4 % — ABNORMAL HIGH (ref 11.5–15.5)
WBC: 14.6 10*3/uL — ABNORMAL HIGH (ref 4.0–10.5)

## 2013-02-11 LAB — GLUCOSE, CAPILLARY
Glucose-Capillary: 182 mg/dL — ABNORMAL HIGH (ref 70–99)
Glucose-Capillary: 184 mg/dL — ABNORMAL HIGH (ref 70–99)
Glucose-Capillary: 188 mg/dL — ABNORMAL HIGH (ref 70–99)
Glucose-Capillary: 208 mg/dL — ABNORMAL HIGH (ref 70–99)
Glucose-Capillary: 209 mg/dL — ABNORMAL HIGH (ref 70–99)
Glucose-Capillary: 224 mg/dL — ABNORMAL HIGH (ref 70–99)
Glucose-Capillary: 257 mg/dL — ABNORMAL HIGH (ref 70–99)

## 2013-02-11 NOTE — Progress Notes (Signed)
2 Days Post-Op  Subjective: Comfortable No flatus  Objective: Vital signs in last 24 hours: Temp:  [97.8 F (36.6 C)-99.1 F (37.3 C)] 99.1 F (37.3 C) (06/04 0505) Pulse Rate:  [92-104] 96 (06/04 0505) Resp:  [16-26] 20 (06/04 0505) BP: (109-141)/(53-65) 141/53 mmHg (06/04 0505) SpO2:  [92 %-98 %] 98 % (06/04 0505) Last BM Date:  (Pre-Op)  Intake/Output from previous day: 06/03 0701 - 06/04 0700 In: 3637.1 [I.V.:2677.1; NG/GT:110; IV Piggyback:850] Out: 780 [Urine:600; Emesis/NG output:150; Drains:30] Intake/Output this shift: Total I/O In: -  Out: 325 [Urine:325]  Lungs clear Abdomen soft, no BS  Lab Results:   Recent Labs  02/10/13 0442 02/11/13 0440  WBC 12.2* 14.6*  HGB 10.6* 8.7*  HCT 34.5* 29.0*  PLT 287 250   BMET  Recent Labs  02/10/13 1150 02/11/13 0440  NA 137 137  K 5.7* 5.2*  CL 103 104  CO2 26 25  GLUCOSE 296* 250*  BUN 29* 29*  CREATININE 1.54* 1.22  CALCIUM 7.4* 7.6*   PT/INR  Recent Labs  02/09/13 0800  LABPROT 14.7  INR 1.17   ABG No results found for this basename: PHART, PCO2, PO2, HCO3,  in the last 72 hours  Studies/Results: Dg Chest Port 1 View  02/09/2013   *RADIOLOGY REPORT*  Clinical Data: Status post Port-A-Cath placement.  PORTABLE CHEST - 1 VIEW  Comparison: PA and lateral chest 09/29/2011.  Findings: Left subclavian Port-A-Cath is in place with the tip projecting over the mid superior vena cava.  No pneumothorax identified.  Lung volumes are low with some basilar atelectasis. There is mild cardiomegaly.  No pleural fluid.  IMPRESSION: Tip of Port-A-Cath projects over the mid superior vena cava.  No pneumothorax or other acute finding.   Original Report Authenticated By: Orlean Patten, M.D.   Dg C-arm 1-60 Min-no Report  02/09/2013   CLINICAL DATA: portacath   C-ARM 1-60 MINUTES  Fluoroscopy was utilized by the requesting physician.  No radiographic  interpretation.     Anti-infectives: Anti-infectives   Start      Dose/Rate Route Frequency Ordered Stop   02/09/13 1600  cefOXitin (MEFOXIN) 2 g in dextrose 5 % 50 mL IVPB     2 g 100 mL/hr over 30 Minutes Intravenous Every 6 hours 02/09/13 1411     02/09/13 0800  cefOXitin (MEFOXIN) 2 g in dextrose 5 % 50 mL IVPB     2 g 100 mL/hr over 30 Minutes Intravenous On call to O.R. 02/09/13 [id number] 02/09/13 [id number]      Assessment/Plan: s/p Procedure(s): EXPLORATORY LAPAROTOMY WITH incisional biopsy intra- ABDOMINAL MASS ILOCECTOMY  (N/A) INSERTION PORT-A-CATH (N/A)  D/c ng Anemia of chronic disease.  Will continue to follow Wound VAC  LOS: 2 days    Desia Saban A 02/11/2013

## 2013-02-11 NOTE — Progress Notes (Signed)
Patient was only able to void 50 cc on his own since previous I&O cath on day shift.  Notified MD on call and ordered received to I&O cath q6h prn.  Patient was able to void on his own prior to cath being performed.  Will continue to monitor patient.

## 2013-02-12 LAB — GLUCOSE, CAPILLARY
Glucose-Capillary: 199 mg/dL — ABNORMAL HIGH (ref 70–99)
Glucose-Capillary: 198 mg/dL — ABNORMAL HIGH (ref 70–99)
Glucose-Capillary: 238 mg/dL — ABNORMAL HIGH (ref 70–99)
Glucose-Capillary: 332 mg/dL — ABNORMAL HIGH (ref 70–99)
Glucose-Capillary: 196 mg/dL — ABNORMAL HIGH (ref 70–99)
Glucose-Capillary: 293 mg/dL — ABNORMAL HIGH (ref 70–99)

## 2013-02-12 NOTE — Progress Notes (Signed)
Inpatient Diabetes Program Recommendations  AACE/ADA: New Consensus Statement on Inpatient Glycemic Control (2013)  Target Ranges:  Prepandial:   less than 140 mg/dL      Peak postprandial:   less than 180 mg/dL (1-2 hours)      Critically ill patients:  140 - 180 mg/dL   Reason for Visit: Hyperglycemia  Results for MALEKHI, ZODROW (MRN [id number]) as of 02/12/2013 11:27  Ref. Range 02/11/2013 07:51 02/11/2013 11:57 02/11/2013 15:44 02/11/2013 20:11 02/11/2013 23:34 02/12/2013 01:31 02/12/2013 03:56 02/12/2013 07:54  Glucose-Capillary Latest Range: 70-99 mg/dL 208 (H) 257 (H) 184 (H) 182 (H) 188 (H) 199 (H) 198 (H) 238 (H)    Inpatient Diabetes Program Recommendations Insulin - Basal: Add Lantus 40 units QHS (Pt takes 85 units at home) Correction (SSI): Increase Novolog to resistant Q4 and when eating, Novolog resistant tidwc and hs  Note: Will follow.  No HgbA1C on file.  Thank you. Lorenda Peck, RD, LDN, CDE Inpatient Diabetes Coordinator [phone]

## 2013-02-12 NOTE — Progress Notes (Signed)
3 Days Post-Op  Subjective: Had some loose BM's No nausea  Objective: Vital signs in last 24 hours: Temp:  [97.5 F (36.4 C)-98.6 F (37 C)] 97.7 F (36.5 C) (06/05 0538) Pulse Rate:  [85-103] 103 (06/05 0538) Resp:  [14-18] 14 (06/05 0538) BP: (132-147)/(54-76) 132/54 mmHg (06/05 0538) SpO2:  [96 %-100 %] 100 % (06/05 0538) Last BM Date: 02/08/13  Intake/Output from previous day: 06/04 0701 - 06/05 0700 In: 1514.6 [I.V.:1414.6; IV Piggyback:100] Out: 1510 [Urine:1500; Drains:10] Intake/Output this shift: Total I/O In: 1514.6 [I.V.:1414.6; IV Piggyback:100] Out: 980 [Urine:975; Drains:5]  Mildly full VAC in place  Lab Results:   Recent Labs  02/10/13 0442 02/11/13 0440  WBC 12.2* 14.6*  HGB 10.6* 8.7*  HCT 34.5* 29.0*  PLT 287 250   BMET  Recent Labs  02/10/13 1150 02/11/13 0440  NA 137 137  K 5.7* 5.2*  CL 103 104  CO2 26 25  GLUCOSE 296* 250*  BUN 29* 29*  CREATININE 1.54* 1.22  CALCIUM 7.4* 7.6*   PT/INR  Recent Labs  02/09/13 0800  LABPROT 14.7  INR 1.17   ABG No results found for this basename: PHART, PCO2, PO2, HCO3,  in the last 72 hours  Studies/Results: No results found.  Anti-infectives: Anti-infectives   Start     Dose/Rate Route Frequency Ordered Stop   02/09/13 1600  cefOXitin (MEFOXIN) 2 g in dextrose 5 % 50 mL IVPB     2 g 100 mL/hr over 30 Minutes Intravenous Every 6 hours 02/09/13 1411     02/09/13 0800  cefOXitin (MEFOXIN) 2 g in dextrose 5 % 50 mL IVPB     2 g 100 mL/hr over 30 Minutes Intravenous On call to O.R. 02/09/13 [id number] 02/09/13 [id number]      Assessment/Plan: s/p Procedure(s): EXPLORATORY LAPAROTOMY WITH incisional biopsy intra- ABDOMINAL MASS ILOCECTOMY  (N/A) INSERTION PORT-A-CATH (N/A)  Start clears Decrease IVF Check labs in the morning  LOS: 3 days    John [name] A 02/12/2013

## 2013-02-12 NOTE — Progress Notes (Signed)
Placed wound vac without any difficulty; sealed appropriately, no signs of leak or discomfort; will continue montior

## 2013-02-13 LAB — GLUCOSE, CAPILLARY
Glucose-Capillary: 207 mg/dL — ABNORMAL HIGH (ref 70–99)
Glucose-Capillary: 238 mg/dL — ABNORMAL HIGH (ref 70–99)
Glucose-Capillary: 238 mg/dL — ABNORMAL HIGH (ref 70–99)
Glucose-Capillary: 239 mg/dL — ABNORMAL HIGH (ref 70–99)
Glucose-Capillary: 291 mg/dL — ABNORMAL HIGH (ref 70–99)
Glucose-Capillary: 300 mg/dL — ABNORMAL HIGH (ref 70–99)

## 2013-02-13 LAB — BASIC METABOLIC PANEL
BUN: 24 mg/dL — ABNORMAL HIGH (ref 6–23)
CO2: 23 mEq/L (ref 19–32)
Calcium: 7.6 mg/dL — ABNORMAL LOW (ref 8.4–10.5)
Chloride: 107 mEq/L (ref 96–112)
Creatinine, Ser: 0.89 mg/dL (ref 0.50–1.35)
GFR calc Af Amer: >90 mL/min (ref >90)
GFR calc non Af Amer: >90 mL/min (ref >90)
Glucose, Bld: 253 mg/dL — ABNORMAL HIGH (ref 70–99)
Potassium: 4 mEq/L (ref 3.5–5.1)
Sodium: 138 mEq/L (ref 135–145)

## 2013-02-13 LAB — CBC
HCT: 28.4 % — ABNORMAL LOW (ref 39.0–52.0)
Hemoglobin: 8.7 g/dL — ABNORMAL LOW (ref 13.0–17.0)
MCH: 22.3 pg — ABNORMAL LOW (ref 26.0–34.0)
MCHC: 30.6 g/dL (ref 30.0–36.0)
MCV: 72.8 fL — ABNORMAL LOW (ref 78.0–100.0)
Platelets: 268 10*3/uL (ref 150–400)
RBC: 3.9 MIL/uL — ABNORMAL LOW (ref 4.22–5.81)
RDW: 19.4 % — ABNORMAL HIGH (ref 11.5–15.5)
WBC: 6.8 10*3/uL (ref 4.0–10.5)

## 2013-02-13 MED ORDER — INSULIN GLARGINE 100 UNIT/ML ~~LOC~~ SOLN
40.0000 [IU] | Freq: Every day | SUBCUTANEOUS | Status: DC
  Administered 2013-02-13 – 2013-02-18 (×5): 40 [IU] via SUBCUTANEOUS
  Filled 2013-02-13 (×7): qty 0.4

## 2013-02-13 NOTE — Progress Notes (Signed)
Abdominal wound/incision measured prior to changing wound vac dressing today. L=19cm x W=4.7cm x D=5.7cm. Wound vac dressing changed, pt tolerated procedure. Small amount of drain noted in canister.

## 2013-02-13 NOTE — Progress Notes (Signed)
Personally spoke to Dr Keturah Barre. Ninfa Linden while on the unit about this pt's basal insulin need per DM Coordinator. MD aware of it & was told that DM coordinator has made notes to state her recommendation for pt.

## 2013-02-13 NOTE — Progress Notes (Signed)
4 Days Post-Op  Subjective: No flatus yet Denies nausea  Objective: Vital signs in last 24 hours: Temp:  [97.6 F (36.4 C)-98 F (36.7 C)] 98 F (36.7 C) (06/06 0610) Pulse Rate:  [80-95] 88 (06/06 0610) Resp:  [11-19] 13 (06/06 0610) BP: (129-139)/(60-70) 134/67 mmHg (06/06 0610) SpO2:  [93 %-99 %] 97 % (06/06 0610) Last BM Date: 02/08/13  Intake/Output from previous day: 06/05 0701 - 06/06 0700 In: 2638.8 [P.O.:720; I.V.:1918.8] Out: 1730 [Urine:1600; Drains:130] Intake/Output this shift:    Abdomen soft, obese, +BS VAC in place Drain serosang  Lab Results:   Recent Labs  02/11/13 0440 02/13/13 0430  WBC 14.6* 6.8  HGB 8.7* 8.7*  HCT 29.0* 28.4*  PLT 250 268   BMET  Recent Labs  02/11/13 0440 02/13/13 0430  NA 137 138  K 5.2* 4.0  CL 104 107  CO2 25 23  GLUCOSE 250* 253*  BUN 29* 24*  CREATININE 1.22 0.89  CALCIUM 7.6* 7.6*   PT/INR No results found for this basename: LABPROT, INR,  in the last 72 hours ABG No results found for this basename: PHART, PCO2, PO2, HCO3,  in the last 72 hours  Studies/Results: No results found.  Anti-infectives: Anti-infectives   Start     Dose/Rate Route Frequency Ordered Stop   02/09/13 1600  cefOXitin (MEFOXIN) 2 g in dextrose 5 % 50 mL IVPB     2 g 100 mL/hr over 30 Minutes Intravenous Every 6 hours 02/09/13 1411     02/09/13 0800  cefOXitin (MEFOXIN) 2 g in dextrose 5 % 50 mL IVPB     2 g 100 mL/hr over 30 Minutes Intravenous On call to O.R. 02/09/13 [id number] 02/09/13 0952      Assessment/Plan: s/p Procedure(s): EXPLORATORY LAPAROTOMY WITH incisional biopsy intra- ABDOMINAL MASS ILOCECTOMY  (N/A) INSERTION PORT-A-CATH (N/A)  Continue liquids Labs look ok Ambulate Pathology confirms lymphoma.  Discussed with patient.  LOS: 4 days    Kenyata Guess A 02/13/2013

## 2013-02-13 NOTE — Progress Notes (Signed)
Inpatient Diabetes Program Recommendations  AACE/ADA: New Consensus Statement on Inpatient Glycemic Control (2013)  Target Ranges:  Prepandial:   less than 140 mg/dL      Peak postprandial:   less than 180 mg/dL (1-2 hours)      Critically ill patients:  140 - 180 mg/dL   Reason for Visit: Hyperglycemia  Results for John [name], John [name] (MRN [id number]) as of 02/13/2013 17:03  Ref. Range 02/12/2013 20:16 02/13/2013 00:08 02/13/2013 03:24 02/13/2013 07:22 02/13/2013 11:51  Glucose-Capillary Latest Range: 70-99 mg/dL 293 (H) 238 (H) 207 (H) 238 (H) 239 (H)    Inpatient Diabetes Program Recommendations Insulin - Basal: Add Lantus 40 units QHS (Pt takes 85 units at home) Correction (SSI): Increase Novolog to resistant Q4 and when eating, Novolog resistant tidwc and hs  Note: Will request RN to page MD regarding basal insulin.  Thank you. Lorenda Peck, RD, LDN, CDE Inpatient Diabetes Coordinator [phone]

## 2013-02-14 LAB — GLUCOSE, CAPILLARY
Glucose-Capillary: 127 mg/dL — ABNORMAL HIGH (ref 70–99)
Glucose-Capillary: 151 mg/dL — ABNORMAL HIGH (ref 70–99)
Glucose-Capillary: 164 mg/dL — ABNORMAL HIGH (ref 70–99)
Glucose-Capillary: 165 mg/dL — ABNORMAL HIGH (ref 70–99)
Glucose-Capillary: 224 mg/dL — ABNORMAL HIGH (ref 70–99)
Glucose-Capillary: 228 mg/dL — ABNORMAL HIGH (ref 70–99)

## 2013-02-14 MED ORDER — INSULIN ASPART 100 UNIT/ML ~~LOC~~ SOLN
0.0000 [IU] | Freq: Three times a day (TID) | SUBCUTANEOUS | Status: DC
  Administered 2013-02-14: 3 [IU] via SUBCUTANEOUS
  Administered 2013-02-14 – 2013-02-15 (×2): 5 [IU] via SUBCUTANEOUS
  Administered 2013-02-15: 09:00:00 via SUBCUTANEOUS

## 2013-02-14 MED ORDER — INSULIN ASPART 100 UNIT/ML ~~LOC~~ SOLN: 0.0000 [IU] | Freq: Three times a day (TID) | SUBCUTANEOUS | Status: DC

## 2013-02-14 MED ORDER — FUROSEMIDE 10 MG/ML IJ SOLN
20.0000 mg | Freq: Once | INTRAMUSCULAR | Status: AC
  Administered 2013-02-14: 20 mg via INTRAVENOUS
  Filled 2013-02-14 (×2): qty 2

## 2013-02-14 MED ORDER — TRIAMCINOLONE ACETONIDE 0.1 % EX OINT
TOPICAL_OINTMENT | Freq: Two times a day (BID) | CUTANEOUS | Status: DC
Start: 2013-02-14 — End: 2013-02-20
  Administered 2013-02-18 – 2013-02-19 (×3): via TOPICAL
  Filled 2013-02-14: qty 15

## 2013-02-14 MED ORDER — FUROSEMIDE 40 MG PO TABS
40.0000 mg | ORAL_TABLET | Freq: Every day | ORAL | Status: DC
  Administered 2013-02-15 – 2013-02-19 (×5): 40 mg via ORAL
  Filled 2013-02-14 (×5): qty 1

## 2013-02-14 NOTE — Progress Notes (Signed)
Pt reported he fell a few weeks ago and injured his RLE.  Area is reddened with blisters.  Will notify MD during rounds this am.

## 2013-02-14 NOTE — Progress Notes (Signed)
5 Days Post-Op   Assessment: s/p Procedure(s): EXPLORATORY LAPAROTOMY WITH incisional biopsy intra- ABDOMINAL MASS ILOCECTOMY  INSERTION PORT-A-CATH Patient Active Problem List   Diagnosis Date Noted  . Abdominal mass 01/16/2013  . Anemia 08/04/2012  . CAD (coronary artery disease) 05/09/2011  . Obesity 05/09/2011  . CARDIOMYOPATHY 01/11/2010  . ATRIAL FIBRILLATION 01/11/2010  . CHEST PAIN 01/11/2010  . MITRAL VALVE REPLACEMENT, HX OF 01/11/2010  . HYPERLIPIDEMIA 11/30/2008  . HYPERTENSION 11/30/2008  . UNSPEC ALVEOLAR&PARIETOALVEOLAR PNEUMONOPATHY 11/30/2008  . 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  Objective: Vital signs in last 24 hours: Temp:  [97.8 F (36.6 C)-98.4 F (36.9 C)] 98.1 F (36.7 C) (06/07 0500) Pulse Rate:  [79-93] 79 (06/07 0500) Resp:  [12-20] 20 (06/07 0917) BP: (127-148)/(63-70) 127/63 mmHg (06/07 0500) SpO2:  [95 %-99 %] 96 % (06/07 0917) FiO2 (%):  [25 %-33 %] 25 % (06/07 0917)   Intake/Output from previous day: 06/06 0701 - 06/07 0700 In: 2581.3 [P.O.:600; I.V.:1781.3; IV Piggyback:200] Out: 1217 [Urine:1150; Drains:67]  General appearance: alert, cooperative, fatigued and no distress Resp: clear to auscultation bilaterally GI: Abd still distended, mild diffusely tender, rare BS Both legs with diffuse edema and some "brawny" edema of RLL.  Incision: VAC in place, JP thin  Lab Results:   Recent Labs  02/13/13 0430  WBC 6.8  HGB 8.7*  HCT 28.4*  PLT 268   BMET  Recent Labs  02/13/13 0430  NA 138  K 4.0  CL 107  CO2 23  GLUCOSE 253*  BUN 24*  CREATININE 0.89  CALCIUM 7.6*    MEDS, Scheduled .  amLODipine  5 mg Oral Daily  . cefOXitin  2 g Intravenous Q6H  . digoxin  0.25 mg Oral q morning - 10a  . enoxaparin (LOVENOX) injection  40 mg Subcutaneous Q24H  . HYDROmorphone PCA 0.3 mg/mL   Intravenous Q4H  . insulin aspart  0-15 Units Subcutaneous Q4H  . insulin glargine  40 Units Subcutaneous QHS  . metoprolol  200 mg Oral Daily  . pantoprazole (PROTONIX) IV  40 mg Intravenous Q24H    Studies/Results: No results found.    LOS: 5 days     Haywood Lasso, MD, [facility], [place]   02/14/2013

## 2013-02-14 NOTE — Progress Notes (Signed)
Pt now on diet.  Needs achs order with order or not for HS coverage.

## 2013-02-14 NOTE — Consult Note (Signed)
WOC consult Note Reason for Consult: Right anterior LE with erythema, blisters Wound type:Vascular (primarily venous insufficiency) Pressure Ulcer POA: No Measurement:5cm x 4cm areas of erythema with three small areas that wife states have been blistered in the recent past (this am) but that are not present now.  Wife states that  since they have been applying the triamcinolone cream three times daily (brought with them from home, provided by Urgent Care here in [place]) and since he has been on antibiotics, and since they have started diuretics, the LEs have improved.  Wound bed:No open wound.  Erythema and deflated (or reabsorbed) blisters in three areas at sites of previous incisions are noted.  Drainage (amount, consistency, odor) None Periwound:No wound Dressing procedure/placement/frequency:No topical care is indicated unless MD agrees that triamcinolone cream should continue. Systemic antibiotic therapy is in place. The only addition to the POC that I can see is elevation of the RLE. I will not follow.  Please re-consult if needed. Thanks, Maudie Flakes, MSN, RN, [facility], [place] [phone])

## 2013-02-15 ENCOUNTER — Inpatient Hospital Stay (HOSPITAL_COMMUNITY): Payer: BC Managed Care – PPO

## 2013-02-15 LAB — BASIC METABOLIC PANEL
BUN: 17 mg/dL (ref 6–23)
CO2: 24 mEq/L (ref 19–32)
Calcium: 8 mg/dL — ABNORMAL LOW (ref 8.4–10.5)
Chloride: 106 mEq/L (ref 96–112)
Creatinine, Ser: 0.83 mg/dL (ref 0.50–1.35)
GFR calc Af Amer: >90 mL/min (ref >90)
GFR calc non Af Amer: >90 mL/min (ref >90)
Glucose, Bld: 166 mg/dL — ABNORMAL HIGH (ref 70–99)
Potassium: 4 mEq/L (ref 3.5–5.1)
Sodium: 138 mEq/L (ref 135–145)

## 2013-02-15 LAB — CBC
HCT: 31.7 % — ABNORMAL LOW (ref 39.0–52.0)
Hemoglobin: 9.6 g/dL — ABNORMAL LOW (ref 13.0–17.0)
MCH: 22 pg — ABNORMAL LOW (ref 26.0–34.0)
MCHC: 30.3 g/dL (ref 30.0–36.0)
MCV: 72.7 fL — ABNORMAL LOW (ref 78.0–100.0)
Platelets: 366 10*3/uL (ref 150–400)
RBC: 4.36 MIL/uL (ref 4.22–5.81)
RDW: 19.4 % — ABNORMAL HIGH (ref 11.5–15.5)
WBC: 9.8 10*3/uL (ref 4.0–10.5)

## 2013-02-15 LAB — GLUCOSE, CAPILLARY
Glucose-Capillary: 131 mg/dL — ABNORMAL HIGH (ref 70–99)
Glucose-Capillary: 143 mg/dL — ABNORMAL HIGH (ref 70–99)
Glucose-Capillary: 143 mg/dL — ABNORMAL HIGH (ref 70–99)
Glucose-Capillary: 173 mg/dL — ABNORMAL HIGH (ref 70–99)
Glucose-Capillary: 213 mg/dL — ABNORMAL HIGH (ref 70–99)

## 2013-02-15 MED ORDER — INSULIN ASPART 100 UNIT/ML ~~LOC~~ SOLN
0.0000 [IU] | SUBCUTANEOUS | Status: DC
  Administered 2013-02-15 – 2013-02-16 (×5): 2 [IU] via SUBCUTANEOUS
  Administered 2013-02-16: 12:00:00 via SUBCUTANEOUS
  Administered 2013-02-16 – 2013-02-18 (×4): 2 [IU] via SUBCUTANEOUS
  Administered 2013-02-18 (×2): 5 [IU] via SUBCUTANEOUS
  Administered 2013-02-18: 3 [IU] via SUBCUTANEOUS
  Administered 2013-02-19: 2 [IU] via SUBCUTANEOUS
  Administered 2013-02-19: 3 [IU] via SUBCUTANEOUS
  Administered 2013-02-19: 2 [IU] via SUBCUTANEOUS
  Administered 2013-02-19: 3 [IU] via SUBCUTANEOUS

## 2013-02-15 MED ORDER — FUROSEMIDE 10 MG/ML IJ SOLN
20.0000 mg | Freq: Once | INTRAMUSCULAR | Status: AC
  Administered 2013-02-15: 20 mg via INTRAVENOUS
  Filled 2013-02-15: qty 2

## 2013-02-15 NOTE — Progress Notes (Signed)
6 Days Post-Op  Subjective: Not feeling any better.  Had some nausea but no vomiting.  Small amount of gas but no BM  Objective: Vital signs in last 24 hours: Temp:  [97.9 F (36.6 C)-98.2 F (36.8 C)] 97.9 F (36.6 C) (06/08 0437) Pulse Rate:  [84] 84 (06/07 2030) Resp:  [13-22] 20 (06/08 0437) BP: (131-146)/(50-61) 131/50 mmHg (06/08 0437) SpO2:  [95 %-100 %] 98 % (06/08 0437) Last BM Date: 02/08/13  Intake/Output from previous day: 06/07 0701 - 06/08 0700 In: 31472.1 [P.O.:420; I.V.:1825; IV Piggyback:29197.1] Out: [id number] [Urine:1525; Drains:40] Intake/Output this shift:    General appearance: alert, cooperative and no distress Resp: nonlabored Cardio: normal rate, regular GI: soft, mild tenderness, mild distension, wound vac in place, JP mostly serous, no peritoneal signs Extremities: SCD's in place, pitting edema bilaterally and bilateral foot and ankle swelling  Lab Results:   Recent Labs  02/13/13 0430 02/15/13 0429  WBC 6.8 9.8  HGB 8.7* 9.6*  HCT 28.4* 31.7*  PLT 268 366   BMET  Recent Labs  02/13/13 0430 02/15/13 0429  NA 138 138  K 4.0 4.0  CL 107 106  CO2 23 24  GLUCOSE 253* 166*  BUN 24* 17  CREATININE 0.89 0.83  CALCIUM 7.6* 8.0*   PT/INR No results found for this basename: LABPROT, INR,  in the last 72 hours ABG No results found for this basename: PHART, PCO2, PO2, HCO3,  in the last 72 hours  Studies/Results: No results found.  Anti-infectives: Anti-infectives   Start     Dose/Rate Route Frequency Ordered Stop   02/09/13 1600  cefOXitin (MEFOXIN) 2 g in dextrose 5 % 50 mL IVPB     2 g 100 mL/hr over 30 Minutes Intravenous Every 6 hours 02/09/13 1411     02/09/13 0800  cefOXitin (MEFOXIN) 2 g in dextrose 5 % 50 mL IVPB     2 g 100 mL/hr over 30 Minutes Intravenous On call to O.R. 02/09/13 [id number] 02/09/13 [id number]      Assessment/Plan: s/p Procedure(s): EXPLORATORY LAPAROTOMY WITH incisional biopsy intra- ABDOMINAL MASS ILOCECTOMY   (N/A) INSERTION PORT-A-CATH (N/A) I have made him NPO since his abdomen is distended and he really does not feel that well and is nauseated.  Will check abdominal films and redose his lasix.    LOS: 6 days    John [name] John [name] 02/15/2013

## 2013-02-16 ENCOUNTER — Telehealth: Payer: Self-pay | Admitting: Oncology

## 2013-02-16 LAB — CBC WITH DIFFERENTIAL/PLATELET
Basophils Absolute: 0 10*3/uL (ref 0.0–0.1)
Basophils Relative: 0 % (ref 0–1)
Eosinophils Absolute: 0.2 10*3/uL (ref 0.0–0.7)
Eosinophils Relative: 2 % (ref 0–5)
HCT: 28.6 % — ABNORMAL LOW (ref 39.0–52.0)
Hemoglobin: 8.6 g/dL — ABNORMAL LOW (ref 13.0–17.0)
Lymphocytes Relative: 8 % — ABNORMAL LOW (ref 12–46)
Lymphs Abs: 0.7 10*3/uL (ref 0.7–4.0)
MCH: 21.7 pg — ABNORMAL LOW (ref 26.0–34.0)
MCHC: 30.1 g/dL (ref 30.0–36.0)
MCV: 72 fL — ABNORMAL LOW (ref 78.0–100.0)
Monocytes Absolute: 0.9 10*3/uL (ref 0.1–1.0)
Monocytes Relative: 10 % (ref 3–12)
Neutro Abs: 7.5 10*3/uL (ref 1.7–7.7)
Neutrophils Relative %: 80 % — ABNORMAL HIGH (ref 43–77)
Platelets: 349 10*3/uL (ref 150–400)
RBC: 3.97 MIL/uL — ABNORMAL LOW (ref 4.22–5.81)
RDW: 19.6 % — ABNORMAL HIGH (ref 11.5–15.5)
WBC: 9.3 10*3/uL (ref 4.0–10.5)

## 2013-02-16 LAB — BASIC METABOLIC PANEL
BUN: 16 mg/dL (ref 6–23)
CO2: 24 mEq/L (ref 19–32)
Calcium: 8.2 mg/dL — ABNORMAL LOW (ref 8.4–10.5)
Chloride: 107 mEq/L (ref 96–112)
Creatinine, Ser: 0.91 mg/dL (ref 0.50–1.35)
GFR calc Af Amer: >90 mL/min (ref >90)
GFR calc non Af Amer: >90 mL/min (ref >90)
Glucose, Bld: 131 mg/dL — ABNORMAL HIGH (ref 70–99)
Potassium: 3.9 mEq/L (ref 3.5–5.1)
Sodium: 140 mEq/L (ref 135–145)

## 2013-02-16 LAB — GLUCOSE, CAPILLARY
Glucose-Capillary: 113 mg/dL — ABNORMAL HIGH (ref 70–99)
Glucose-Capillary: 120 mg/dL — ABNORMAL HIGH (ref 70–99)
Glucose-Capillary: 121 mg/dL — ABNORMAL HIGH (ref 70–99)
Glucose-Capillary: 133 mg/dL — ABNORMAL HIGH (ref 70–99)
Glucose-Capillary: 133 mg/dL — ABNORMAL HIGH (ref 70–99)
Glucose-Capillary: 134 mg/dL — ABNORMAL HIGH (ref 70–99)
Glucose-Capillary: 136 mg/dL — ABNORMAL HIGH (ref 70–99)

## 2013-02-16 MED ORDER — METOCLOPRAMIDE HCL 5 MG/ML IJ SOLN
10.0000 mg | Freq: Four times a day (QID) | INTRAMUSCULAR | Status: AC
Start: 2013-02-16 — End: 2013-02-18
  Administered 2013-02-16 – 2013-02-18 (×12): 10 mg via INTRAVENOUS
  Filled 2013-02-16 (×12): qty 2

## 2013-02-16 MED ORDER — BISACODYL 10 MG RE SUPP
10.0000 mg | Freq: Once | RECTAL | Status: AC
  Administered 2013-02-16: 10 mg via RECTAL
  Filled 2013-02-16: qty 1

## 2013-02-16 NOTE — Progress Notes (Signed)
Patient complaining of lower abdominal pain and tightness.  Patient voided 125 cc urine.  Bladder scan performed 25 cc urine post void residual.  Patient abdomen distended small amount of flatus passed.  No complaints of nausea.  Patient sitting in chair.

## 2013-02-16 NOTE — Progress Notes (Signed)
Came to see patient.  He is HD stable, vitals okay.  His abdomen is distended, wound vac in place.  He is resting comfortably now with head elevated.  I have reviewed his xrays and they are consistent with postop ileus.  I have given the order for NG to help with abdominal distension.

## 2013-02-16 NOTE — Progress Notes (Signed)
Patient ID: John [name], male   DOB: [birth year], 62 y.o.   MRN: [id number]  Remains distended, but no nausea  Discussed with pt and wife.  Will try a suppository.  If no improvement tomorrow or emesis develops, will place NG.  He would like to hold for now.

## 2013-02-16 NOTE — Progress Notes (Signed)
John [name] is awake, states he feels much better and doesn't feel as "tight in stomach as before".  Patient assisted to restroom and to chair.  I discussed with John [name] that the doctor and I had discussed his abdominal distention and discomfort that he felt earlier.  We talked about NG tube and how that is inserted and the benefits of an NG in decreasing distention and that feeling of tightness.  John [name] felt that he did not want to make this decision at this point because he felt better and his wife was not present.  I offered to call John [name] wife and discuss the NG tube, but John [name] did not want to call her at this point as the "tightness" was better.  I instructed John [name] since he was not in pain or vomiting we could continue to monitor him, however if his status changed then we would need to contact his wife so they could discuss the benefits of NG.  He stated understanding and verbalized he knew when to contact the nurse.

## 2013-02-16 NOTE — Progress Notes (Signed)
Dr Lilyan Punt paged with concerns related to patient abdominal distention.  Patient abdomen distended and tight, patient restless and has complaints of tightness to abdomen. Bowel sounds are tympanic and states he is no longer passing flatus, he does deny nausea.  Order received to place NG if patient is agreeable.   Patient in bed at this time resting with eyes closed. Will discuss with patient

## 2013-02-16 NOTE — Progress Notes (Signed)
7 Days Post-Op  Subjective: Complains of distension No flatus  Objective: Vital signs in last 24 hours: Temp:  [97.9 F (36.6 C)-98.6 F (37 C)] 98.6 F (37 C) (06/09 0500) Pulse Rate:  [76-87] 87 (06/09 0500) Resp:  [16-22] 18 (06/09 0500) BP: (101-120)/(58-63) 101/59 mmHg (06/09 0500) SpO2:  [95 %-99 %] 97 % (06/09 0500) Last BM Date: 02/08/13  Intake/Output from previous day: Feb 21, 2023 0701 - 06/09 0700 In: 2406.3 [P.O.:30; I.V.:2226.3; IV Piggyback:150] Out: 2670 [Urine:2575; Drains:95] Intake/Output this shift: Total I/O In: 1467.5 [P.O.:30; I.V.:1387.5; IV Piggyback:50] Out: 880 [Urine:875; Drains:5]  Distended on exam  Lab Results:   Recent Labs  02-20-2013 0429 02/16/13 0409  WBC 9.8 9.3  HGB 9.6* 8.6*  HCT 31.7* 28.6*  PLT 366 349   BMET  Recent Labs  2013-02-20 0429 02/16/13 0409  NA 138 140  K 4.0 3.9  CL 106 107  CO2 24 24  GLUCOSE 166* 131*  BUN 17 16  CREATININE 0.83 0.91  CALCIUM 8.0* 8.2*   PT/INR No results found for this basename: LABPROT, INR,  in the last 72 hours ABG No results found for this basename: PHART, PCO2, PO2, HCO3,  in the last 72 hours  Studies/Results: Dg Abd 2 Views  February 20, 2013   *RADIOLOGY REPORT*  Clinical Data: Postop right colectomy.  Nausea.  Abdominal pain.  ABDOMEN - 2 VIEW  Comparison: Previous CT exam 01/05/2013  Findings: The patient has had previous median sternotomy and valve replacement.  Heart is enlarged.  There is left lower lobe atelectasis.  Supine and erect views of the abdomen show right lower quadrant drain.  There is significant distension of multiple small bowel loops, containing air fluid levels on the erect view. There is mild distension of the transverse colon.  Other colonic loops do not appear be dilated.  IMPRESSION:  1.  Dilated small bowel loops, proportionately more distended than the large bowel loops, favoring small bowel obstruction over ileus. 2.  Right lower quadrant drain.   Original Report  Authenticated By: Nolon Nations, M.D.    Anti-infectives: Anti-infectives   Start     Dose/Rate Route Frequency Ordered Stop   02/09/13 1600  cefOXitin (MEFOXIN) 2 g in dextrose 5 % 50 mL IVPB     2 g 100 mL/hr over 30 Minutes Intravenous Every 6 hours 02/09/13 1411     02/09/13 0800  cefOXitin (MEFOXIN) 2 g in dextrose 5 % 50 mL IVPB     2 g 100 mL/hr over 30 Minutes Intravenous On call to O.R. 02/09/13 [id number] 02/09/13 0952      Assessment/Plan: s/p Procedure(s): EXPLORATORY LAPAROTOMY WITH incisional biopsy intra- ABDOMINAL MASS ILOCECTOMY  (N/A) INSERTION PORT-A-CATH (N/A)  Post op ileus Continue bowel rest ambulate  LOS: 7 days    Brydon Spahr A 02/16/2013

## 2013-02-17 LAB — PROTIME-INR
INR: 2.13 — ABNORMAL HIGH (ref 0.00–1.49)
Prothrombin Time: 22.9 seconds — ABNORMAL HIGH (ref 11.6–15.2)

## 2013-02-17 LAB — GLUCOSE, CAPILLARY
Glucose-Capillary: 105 mg/dL — ABNORMAL HIGH (ref 70–99)
Glucose-Capillary: 104 mg/dL — ABNORMAL HIGH (ref 70–99)
Glucose-Capillary: 123 mg/dL — ABNORMAL HIGH (ref 70–99)
Glucose-Capillary: 103 mg/dL — ABNORMAL HIGH (ref 70–99)
Glucose-Capillary: 123 mg/dL — ABNORMAL HIGH (ref 70–99)
Glucose-Capillary: 105 mg/dL — ABNORMAL HIGH (ref 70–99)

## 2013-02-17 MED ORDER — ENOXAPARIN SODIUM 120 MG/0.8ML ~~LOC~~ SOLN
1.0000 mg/kg | Freq: Two times a day (BID) | SUBCUTANEOUS | Status: DC
  Administered 2013-02-17 – 2013-02-18 (×3): 115 mg via SUBCUTANEOUS
  Filled 2013-02-17 (×5): qty 0.8

## 2013-02-17 MED ORDER — WARFARIN - PHARMACIST DOSING INPATIENT: Freq: Every day | Status: DC

## 2013-02-17 MED ORDER — RAMIPRIL 5 MG PO CAPS
5.0000 mg | ORAL_CAPSULE | Freq: Two times a day (BID) | ORAL | Status: DC
  Administered 2013-02-17 – 2013-02-19 (×5): 5 mg via ORAL
  Filled 2013-02-17 (×6): qty 1

## 2013-02-17 MED ORDER — CHLORHEXIDINE GLUCONATE 0.12 % MT SOLN
15.0000 mL | Freq: Two times a day (BID) | OROMUCOSAL | Status: DC
  Administered 2013-02-17 – 2013-02-19 (×5): 15 mL via OROMUCOSAL
  Filled 2013-02-17 (×7): qty 15

## 2013-02-17 MED ORDER — WARFARIN SODIUM 7.5 MG PO TABS
7.5000 mg | ORAL_TABLET | Freq: Once | ORAL | Status: DC
  Filled 2013-02-17: qty 1

## 2013-02-17 MED ORDER — BISACODYL 10 MG RE SUPP
10.0000 mg | Freq: Once | RECTAL | Status: AC
  Administered 2013-02-17: 10 mg via RECTAL
  Filled 2013-02-17: qty 1

## 2013-02-17 MED ORDER — WARFARIN SODIUM 7.5 MG PO TABS
7.5000 mg | ORAL_TABLET | Freq: Once | ORAL | Status: AC
  Administered 2013-02-17: 7.5 mg via ORAL
  Filled 2013-02-17 (×2): qty 1

## 2013-02-17 MED ORDER — BIOTENE DRY MOUTH MT LIQD
15.0000 mL | Freq: Two times a day (BID) | OROMUCOSAL | Status: DC
  Administered 2013-02-17 – 2013-02-19 (×6): 15 mL via OROMUCOSAL

## 2013-02-17 NOTE — Progress Notes (Signed)
ANTICOAGULATION CONSULT NOTE - Initial Consult  Pharmacy Consult for Warfarin/Lovenox Indication: St Jude Mitral Valve/A-fib  No Known Allergies  Patient Measurements: Height: 6' (182.9 cm) Weight: 252 lb 0.7 oz (114.325 kg) IBW/kg (Calculated) : 77.6   Vital Signs: Temp: 97.9 F (36.6 C) (06/10 0559) Temp src: Oral (06/10 0559) BP: 148/68 mmHg (06/10 0559) Pulse Rate: 94 (06/10 0559)  Labs:  Recent Labs  02/15/13 0429 02/16/13 0409  HGB 9.6* 8.6*  HCT 31.7* 28.6*  PLT 366 349  CREATININE 0.83 0.91    Estimated Creatinine Clearance: 114.1 ml/min (by C-G formula based on Cr of 0.91).   Medical History: Past Medical History  Diagnosis Date  . DM (diabetes mellitus)   . HLD (hyperlipidemia)   . HTN (hypertension)   . Rheumatic fever     as a child  . CAD (coronary artery disease)     a. s/p CABG 11/2000 (L-LAD, S-RCA);  b. Lexiscan Myoview 11/12: EF 45%, ischemia and possibly some scar at the base of the inferolateral wall;   c. Lex MV 11/13:  EF 44%, Inf and IL scar/soft tissue atten, small mild amt of inf ischemia,  . Atrial fibrillation     coumadin managed by PCP  . Rheumatic heart disease     a. s/p mechanical (St. Jude) MVR 2002;  b. Echo 5/11: Mild LVH, EF 45-50%, mild AS, mild AI, mean gradient 11 mmHg, MVR with normal gradients, moderate LAE, mild RAE;  c.  Echo 11/13:   mod LVH, EF 55-60%, mild AS (mean 18 mmHg), mild AI, severe LAE, mild RAE, PASP 41  . 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 Anemia   . GERD (gastroesophageal reflux disease)   . ED (erectile dysfunction)   . SOB (shortness of breath)   . Kidney stones     hx of   . Arthritis     back     Medications:  Scheduled:  . amLODipine  5 mg Oral Daily  . antiseptic oral rinse  15 mL Mouth Rinse q12n4p  . chlorhexidine  15 mL Mouth Rinse BID  . digoxin  0.25 mg Oral q morning - 10a  . enoxaparin (LOVENOX) injection  1 mg/kg Subcutaneous Q12H  . furosemide  40 mg Oral Daily  .  HYDROmorphone PCA 0.3 mg/mL   Intravenous Q4H  . insulin aspart  0-15 Units Subcutaneous Q4H  . insulin glargine  40 Units Subcutaneous QHS  . metoCLOPramide (REGLAN) injection  10 mg Intravenous Q6H  . metoprolol  200 mg Oral Daily  . pantoprazole (PROTONIX) IV  40 mg Intravenous Q24H  . ramipril  5 mg Oral BID  . triamcinolone ointment   Topical BID  . warfarin  7.5 mg Oral Once  . Warfarin - Pharmacist Dosing Inpatient   Does not apply q1800   Infusions:  . sodium chloride 125 mL (02/16/13 2239)    Assessment: 62 yo s/p St Jude Mechanical Valve and history of A-fib on chronic coumadin.  Pt is 8 days s/p exploratory laparotomy with incisional biopsy intra-abdominal mass ilocectomy (n/a)  Port-a-cath insertion.  MD restarting warfarin with lovenox bridge.  Goal of Therapy:  INR=2.5-3.5    Plan:   Lovenox 115mg  SQ q12h  Check INR this am (last INR 6/2 0800 pre-op)  Coumadin 7.5 mg x1 this am.  Daily PT/INR  Lawana Pai R 02/17/2013,6:50 AM

## 2013-02-17 NOTE — Progress Notes (Signed)
8 Days Post-Op  Subjective: Had 3 BM's and passing flatus  Objective: Vital signs in last 24 hours: Temp:  [97.9 F (36.6 C)-98.7 F (37.1 C)] 97.9 F (36.6 C) (06/10 0559) Pulse Rate:  [92-103] 94 (06/10 0559) Resp:  [14-18] 16 (06/10 0559) BP: (123-148)/(58-69) 148/68 mmHg (06/10 0559) SpO2:  [91 %-99 %] 98 % (06/10 0559) Last BM Date: 02/16/13  Intake/Output from previous day: 06/09 0701 - 06/10 0700 In: 3129.2 [I.V.:2979.2; IV Piggyback:150] Out: 35 [Urine:800; Drains:20] Intake/Output this shift: Total I/O In: 1506.3 [I.V.:1506.3] Out: 160 [Urine:150; Drains:10]  Still distended, but non tender abdomen Incision clean, VAC in place Drain serosang  Lab Results:   Recent Labs  February 17, 2013 0429 02/16/13 0409  WBC 9.8 9.3  HGB 9.6* 8.6*  HCT 31.7* 28.6*  PLT 366 349   BMET  Recent Labs  17-Feb-2013 0429 02/16/13 0409  NA 138 140  K 4.0 3.9  CL 106 107  CO2 24 24  GLUCOSE 166* 131*  BUN 17 16  CREATININE 0.83 0.91  CALCIUM 8.0* 8.2*   PT/INR No results found for this basename: LABPROT, INR,  in the last 72 hours ABG No results found for this basename: PHART, PCO2, PO2, HCO3,  in the last 72 hours  Studies/Results: Dg Abd 2 Views  17-Feb-2013   *RADIOLOGY REPORT*  Clinical Data: Postop right colectomy.  Nausea.  Abdominal pain.  ABDOMEN - 2 VIEW  Comparison: Previous CT exam 01/05/2013  Findings: The patient has had previous median sternotomy and valve replacement.  Heart is enlarged.  There is left lower lobe atelectasis.  Supine and erect views of the abdomen show right lower quadrant drain.  There is significant distension of multiple small bowel loops, containing air fluid levels on the erect view. There is mild distension of the transverse colon.  Other colonic loops do not appear be dilated.  IMPRESSION:  1.  Dilated small bowel loops, proportionately more distended than the large bowel loops, favoring small bowel obstruction over ileus. 2.  Right lower  quadrant drain.   Original Report Authenticated By: Nolon Nations, M.D.    Anti-infectives: Anti-infectives   Start     Dose/Rate Route Frequency Ordered Stop   02/09/13 1600  cefOXitin (MEFOXIN) 2 g in dextrose 5 % 50 mL IVPB     2 g 100 mL/hr over 30 Minutes Intravenous Every 6 hours 02/09/13 1411     02/09/13 0800  cefOXitin (MEFOXIN) 2 g in dextrose 5 % 50 mL IVPB     2 g 100 mL/hr over 30 Minutes Intravenous On call to O.R. 02/09/13 [id number] 02/09/13 [id number]      Assessment/Plan: s/p Procedure(s): EXPLORATORY LAPAROTOMY WITH incisional biopsy intra- ABDOMINAL MASS ILOCECTOMY  (N/A) INSERTION PORT-A-CATH (N/A)  reoeat suppos Keep on clears High dose lovenox until I can restart coumadin  LOS: 8 days    John [name] A 02/17/2013

## 2013-02-18 ENCOUNTER — Encounter (HOSPITAL_COMMUNITY): Payer: Self-pay | Admitting: *Deleted

## 2013-02-18 ENCOUNTER — Inpatient Hospital Stay (HOSPITAL_COMMUNITY): Payer: BC Managed Care – PPO

## 2013-02-18 ENCOUNTER — Telehealth: Payer: Self-pay | Admitting: *Deleted

## 2013-02-18 LAB — GLUCOSE, CAPILLARY
Glucose-Capillary: 117 mg/dL — ABNORMAL HIGH (ref 70–99)
Glucose-Capillary: 135 mg/dL — ABNORMAL HIGH (ref 70–99)
Glucose-Capillary: 161 mg/dL — ABNORMAL HIGH (ref 70–99)
Glucose-Capillary: 201 mg/dL — ABNORMAL HIGH (ref 70–99)
Glucose-Capillary: 221 mg/dL — ABNORMAL HIGH (ref 70–99)
Glucose-Capillary: 94 mg/dL (ref 70–99)
Glucose-Capillary: 97 mg/dL (ref 70–99)

## 2013-02-18 LAB — PROTIME-INR
INR: 3.33 — ABNORMAL HIGH (ref 0.00–1.49)
Prothrombin Time: 31.9 seconds — ABNORMAL HIGH (ref 11.6–15.2)

## 2013-02-18 MED ORDER — FUROSEMIDE 10 MG/ML IJ SOLN
20.0000 mg | Freq: Once | INTRAMUSCULAR | Status: AC
  Administered 2013-02-18: 20 mg via INTRAVENOUS
  Filled 2013-02-18: qty 2

## 2013-02-18 MED ORDER — HYDROMORPHONE HCL PF 1 MG/ML IJ SOLN
1.0000 mg | INTRAMUSCULAR | Status: DC | PRN
  Administered 2013-02-18: 1 mg via INTRAVENOUS
  Filled 2013-02-18 (×2): qty 1

## 2013-02-18 MED ORDER — WARFARIN SODIUM 1 MG PO TABS
1.0000 mg | ORAL_TABLET | Freq: Once | ORAL | Status: AC
  Administered 2013-02-18: 1 mg via ORAL
  Filled 2013-02-18: qty 1

## 2013-02-18 MED ORDER — HYDROCODONE-ACETAMINOPHEN 5-325 MG PO TABS
1.0000 | ORAL_TABLET | ORAL | Status: DC | PRN
  Administered 2013-02-18 – 2013-02-19 (×6): 2 via ORAL
  Filled 2013-02-18 (×6): qty 2

## 2013-02-18 NOTE — Progress Notes (Addendum)
ANTICOAGULATION CONSULT NOTE - Follow Up Consult  Pharmacy Consult for Warfarin/Lovenox Indication: St Jude Mitral Valve/A-fib  No Known Allergies  Patient Measurements: Height: 6' (182.9 cm) Weight: 252 lb 0.7 oz (114.325 kg) IBW/kg (Calculated) : 77.6  Vital Signs: Temp: 98.1 F (36.7 C) (06/11 0555) Temp src: Oral (06/11 0555) BP: 126/70 mmHg (06/11 0555) Pulse Rate: 92 (06/11 0555)  Labs:  Recent Labs  02/16/13 0409 02/17/13 0855 02/18/13 0405  HGB 8.6*  --   --   HCT 28.6*  --   --   PLT 349  --   --   LABPROT  --  22.9* 31.9*  INR  --  2.13* 3.33*  CREATININE 0.91  --   --     Estimated Creatinine Clearance: 114.1 ml/min (by C-G formula based on Cr of 0.91).   Medications:  Scheduled:  . amLODipine  5 mg Oral Daily  . antiseptic oral rinse  15 mL Mouth Rinse q12n4p  . chlorhexidine  15 mL Mouth Rinse BID  . digoxin  0.25 mg Oral q morning - 10a  . enoxaparin (LOVENOX) injection  1 mg/kg Subcutaneous Q12H  . furosemide  40 mg Oral Daily  . insulin aspart  0-15 Units Subcutaneous Q4H  . insulin glargine  40 Units Subcutaneous QHS  . metoCLOPramide (REGLAN) injection  10 mg Intravenous Q6H  . metoprolol  200 mg Oral Daily  . pantoprazole (PROTONIX) IV  40 mg Intravenous Q24H  . ramipril  5 mg Oral BID  . triamcinolone ointment   Topical BID  . Warfarin - Pharmacist Dosing Inpatient   Does not apply q1800   Infusions:  . sodium chloride 50 mL/hr at 02/18/13 [id number]    Assessment: 62 yo s/p St Jude Mechanical Valve and history of A-fib on chronic coumadin. Pt is 9 days s/p exploratory laparotomy with incisional biopsy intra-abdominal mass ilocectomy (n/a) Port-a-cath insertion. MD restarted warfarin with lovenox bridge on 6/10.  Dose PTA was 5mg  daily except 7.5mg  on wednesdays  INR therapeutic (3.33) after resuming only one dose of warfarin yesterday. Last dose PTA was 5/28.  H/H low but stable, Pltc wnl, no bleeding reported.  Goal of Therapy:  INR  2.5 - 3.5 Monitor platelets by anticoagulation protocol: Yes   Plan:   Decrease warfarin to 1mg  today  Daily PT/INR  Will f/u with Dr. Ninfa Linden if lovenox can be d/c today since INR is therapeutic  Peggyann Juba, PharmD, BCPS Pager: [phone] 02/18/2013,7:44 AM   Addendum: Damaris Schooner with Dr. Ninfa Linden - ok to dc lovenox today.  Peggyann Juba, PharmD, BCPS 02/18/2013 9:12 AM

## 2013-02-18 NOTE — Telephone Encounter (Signed)
sw Thayer Headings she informed me that the pt is in the hospital right now, but its ok to schedule him for 6.27.14 to have labs @ 9:45am and ov @10 :15am...she will make the pt aware...td

## 2013-02-18 NOTE — Progress Notes (Signed)
ekg done per md request. Pt feels better after 2 nitro. Pt tolerated well. No chest pressure awaiting for chest xray to be performed.

## 2013-02-18 NOTE — Progress Notes (Signed)
Pt c/o chest pressure. Pt is very edematous and states that has increased since this am. Called md on call awaiting call back.

## 2013-02-18 NOTE — Progress Notes (Signed)
MD hoxworth called and informed of pt's results of ekg and chest xray also informed that after 1st nitro pt felt better. md stated to just continue  Monitor pt status. Pt resting in bed w/o any s/s of distress or pain.

## 2013-02-18 NOTE — Progress Notes (Signed)
9 Days Post-Op  Subjective: Had multiple BM's and passing flatus Feels less bloated No pain  Objective: Vital signs in last 24 hours: Temp:  [97.9 F (36.6 C)-98.9 F (37.2 C)] 98.1 F (36.7 C) (06/11 0555) Pulse Rate:  [83-92] 92 (06/11 0555) Resp:  [11-18] 18 (06/11 0555) BP: (126-140)/(66-72) 126/70 mmHg (06/11 0555) SpO2:  [94 %-99 %] 98 % (06/11 0555) Last BM Date: 02/16/13  Intake/Output from previous day: 06/10 0701 - 06/11 0700 In: [id number] [P.O.:1320; I.V.:2500; IV Piggyback:100] Out: 1300 [Urine:1150; Drains:150] Intake/Output this shift: Total I/O In: 1600 [P.O.:600; I.V.:1000] Out: 1140 [Urine:1050; Drains:90]  Abdomen less distended, non tender VAC in place Drain serosang, increased output  Lab Results:   Recent Labs  02/16/13 0409  WBC 9.3  HGB 8.6*  HCT 28.6*  PLT 349   BMET  Recent Labs  02/16/13 0409  NA 140  K 3.9  CL 107  CO2 24  GLUCOSE 131*  BUN 16  CREATININE 0.91  CALCIUM 8.2*   PT/INR  Recent Labs  02/17/13 0855 02/18/13 0405  LABPROT 22.9* 31.9*  INR 2.13* 3.33*   ABG No results found for this basename: PHART, PCO2, PO2, HCO3,  in the last 72 hours  Studies/Results: No results found.  Anti-infectives: Anti-infectives   Start     Dose/Rate Route Frequency Ordered Stop   02/09/13 1600  cefOXitin (MEFOXIN) 2 g in dextrose 5 % 50 mL IVPB  Status:  Discontinued     2 g 100 mL/hr over 30 Minutes Intravenous Every 6 hours 02/09/13 1411 02/17/13 0626   02/09/13 0800  cefOXitin (MEFOXIN) 2 g in dextrose 5 % 50 mL IVPB     2 g 100 mL/hr over 30 Minutes Intravenous On call to O.R. 02/09/13 0726 02/09/13 0952      Assessment/Plan: s/p Procedure(s): EXPLORATORY LAPAROTOMY WITH incisional biopsy intra- ABDOMINAL MASS ILOCECTOMY  (N/A) INSERTION PORT-A-CATH (N/A)  Full liquids Decrease IVF Stop PCA diuresis  LOS: 9 days    John [name] A 02/18/2013

## 2013-02-19 LAB — CBC
HCT: 26.4 % — ABNORMAL LOW (ref 39.0–52.0)
Hemoglobin: 8.1 g/dL — ABNORMAL LOW (ref 13.0–17.0)
MCH: 21.8 pg — ABNORMAL LOW (ref 26.0–34.0)
MCHC: 30.7 g/dL (ref 30.0–36.0)
MCV: 71.2 fL — ABNORMAL LOW (ref 78.0–100.0)
Platelets: 508 10*3/uL — ABNORMAL HIGH (ref 150–400)
RBC: 3.71 MIL/uL — ABNORMAL LOW (ref 4.22–5.81)
RDW: 20 % — ABNORMAL HIGH (ref 11.5–15.5)
WBC: 9.1 10*3/uL (ref 4.0–10.5)

## 2013-02-19 LAB — BASIC METABOLIC PANEL
BUN: 12 mg/dL (ref 6–23)
CO2: 23 mEq/L (ref 19–32)
Calcium: 8 mg/dL — ABNORMAL LOW (ref 8.4–10.5)
Chloride: 107 mEq/L (ref 96–112)
Creatinine, Ser: 0.77 mg/dL (ref 0.50–1.35)
GFR calc Af Amer: >90 mL/min (ref >90)
GFR calc non Af Amer: >90 mL/min (ref >90)
Glucose, Bld: 121 mg/dL — ABNORMAL HIGH (ref 70–99)
Potassium: 3.2 mEq/L — ABNORMAL LOW (ref 3.5–5.1)
Sodium: 142 mEq/L (ref 135–145)

## 2013-02-19 LAB — PROTIME-INR
INR: 4.91 — ABNORMAL HIGH (ref 0.00–1.49)
Prothrombin Time: 42.6 seconds — ABNORMAL HIGH (ref 11.6–15.2)

## 2013-02-19 LAB — GLUCOSE, CAPILLARY
Glucose-Capillary: 123 mg/dL — ABNORMAL HIGH (ref 70–99)
Glucose-Capillary: 117 mg/dL — ABNORMAL HIGH (ref 70–99)
Glucose-Capillary: 159 mg/dL — ABNORMAL HIGH (ref 70–99)
Glucose-Capillary: 129 mg/dL — ABNORMAL HIGH (ref 70–99)
Glucose-Capillary: 184 mg/dL — ABNORMAL HIGH (ref 70–99)

## 2013-02-19 MED ORDER — ZOLPIDEM TARTRATE 10 MG PO TABS: 10.0000 mg | ORAL_TABLET | Freq: Every evening | ORAL | Status: DC | PRN

## 2013-02-19 MED ORDER — POTASSIUM CHLORIDE 20 MEQ/15ML (10%) PO LIQD
40.0000 meq | Freq: Two times a day (BID) | ORAL | Status: DC
  Administered 2013-02-19: 40 meq via ORAL
  Filled 2013-02-19 (×2): qty 30

## 2013-02-19 MED ORDER — FUROSEMIDE 10 MG/ML IJ SOLN
20.0000 mg | Freq: Once | INTRAMUSCULAR | Status: AC
  Administered 2013-02-19: 20 mg via INTRAVENOUS
  Filled 2013-02-19: qty 2

## 2013-02-19 MED ORDER — OXYCODONE-ACETAMINOPHEN 5-325 MG PO TABS: 2.0000 | ORAL_TABLET | ORAL | Status: DC | PRN

## 2013-02-19 NOTE — Progress Notes (Signed)
Was the fall witnessed: no  Patient condition before and after the fall: condition before the fall: stable. Condition after the fall: stable.   Patient's reaction to the fall: calm. Not complaining of pain.   Name of the doctor that was notified including date and time: [place]/ 02/19/13, 2015  Any interventions and vital signs:  BP:145/60, O2 Sat: 99, P: 83, T: 97.4, R: 18  Pt was observed sitting in the chair when I told him and his wife that I would return shortly with the tech Bella Kennedy) to get him ready to leave I also told the pt that I would bring him a paper gown to wear and that we would then bring the wheelchair in before we took him down. The pt's belongings were already packed up and were sitting on the cart outside the room. A Wheelchair had also been placed outside the room on the left side against the wall so that it would be available when we returned. After locating [place] and telling her the pt was ready I went to get the paper gown when I heard the fall in the hallway and the pt's wife yelling "oh no". At this time I walked to where the pt was lying which was just outside his room and he was laying on his stomach. Pt's wife immediately said that "I told him to wait until yall got back". Pt was asked if he was hurting anywhere which he denied. To assist in getting the pt up Orlene Och, and [place] were present. After getting the pt back to the bed I further looked at his back and also checked his wound vac dressing which was still intact. After getting in contact with Dr. [name] I explained the situation and the pt's condition. Dr. Gala Lewandowsky response was that it was ok to let the pt go home as long as he felt ok.  After informing pt of what Dr. [name] said pt said that he felt fine and that he still wanted to go home and that he wasn't having any pain.

## 2013-02-19 NOTE — Progress Notes (Signed)
Coumadin Consult   62 yo male on chronic Coumadin for [facility] mitral valve replacement and A-fib. Pt with abd mass s/p exploratory laparotomy with incisional biopsy of intra-abdominal mass and ileocecectomy on 6/3. From 6/3 to 6/10, pt received Lovenox 40 mg sq daily. On 6/10, MD ordered for Warfarin and full dose Lovenox. Coumadin dose PTA: = 5mg  daily except 7.5mg  Wed. Goal INR 2.5 - 3.5. Last Coumadin dose was 5/28.  INR on 6/10 was 2.13, surprisingly high given no recent Coumadin.   Pharmacy ordered Coumadin 7.5mg  on 6/10 and INR rose to therapeutic 6/11. Coumadin 1mg  given last night and INR today = 4.91, no significant drug-drug interaction noted.  Pt's diet was advanced to regular today.   Plan: Hold Coumadin tonight. Daily PT/INR. Pharmacy will f/u  Vanessa Antoine, PharmD, BCPS Pager: [phone] 10:26 AM Pharmacy #: [phone]

## 2013-02-19 NOTE — Progress Notes (Signed)
10 Days Post-Op  Subjective: Continues to improve More BM's  Objective: Vital signs in last 24 hours: Temp:  [97.9 F (36.6 C)-98.4 F (36.9 C)] 98.1 F (36.7 C) (06/12 0537) Pulse Rate:  [74-79] 74 (06/12 0537) Resp:  [18-20] 20 (06/12 0537) BP: (112-147)/(52-71) 137/66 mmHg (06/12 0537) SpO2:  [97 %-98 %] 97 % (06/12 0537) Last BM Date: 02/16/13  Intake/Output from previous day: 06/11 0701 - 06/12 0700 In: 1663.8 [P.O.:720; I.V.:943.8] Out: 1600 [Urine:1550; Drains:50] Intake/Output this shift: Total I/O In: 1063.8 [P.O.:120; I.V.:943.8] Out: 500 [Urine:500]  Abdomen much softer Drain serosang  Lab Results:   Recent Labs  02/19/13 0529  WBC 9.1  HGB 8.1*  HCT 26.4*  PLT 508*   BMET  Recent Labs  02/19/13 0529  NA 142  K 3.2*  CL 107  CO2 23  GLUCOSE 121*  BUN 12  CREATININE 0.77  CALCIUM 8.0*   PT/INR  Recent Labs  02/18/13 0405 02/19/13 0529  LABPROT 31.9* 42.6*  INR 3.33* 4.91*   ABG No results found for this basename: PHART, PCO2, PO2, HCO3,  in the last 72 hours  Studies/Results: Dg Chest 2 View  02/18/2013   *RADIOLOGY REPORT*  Clinical Data: Chest pressure and recent resection of small bowel lymphoma.  CHEST - 2 VIEW  Comparison: 02/09/2013  Findings: Stable positioning of Port-A-Cath in the SVC.  Lungs are clear and show no evidence of consolidation or edema.  Heart is mildly enlarged and there is evidence of prior mitral valve replacement surgery.  No pleural effusions are identified.  IMPRESSION: No active disease.  Stable cardiomegaly.   Original Report Authenticated By: Aletta Edouard, M.D.    Anti-infectives: Anti-infectives   Start     Dose/Rate Route Frequency Ordered Stop   02/09/13 1600  cefOXitin (MEFOXIN) 2 g in dextrose 5 % 50 mL IVPB  Status:  Discontinued     2 g 100 mL/hr over 30 Minutes Intravenous Every 6 hours 02/09/13 1411 02/17/13 0626   02/09/13 0800  cefOXitin (MEFOXIN) 2 g in dextrose 5 % 50 mL IVPB     2  g 100 mL/hr over 30 Minutes Intravenous On call to O.R. 02/09/13 0726 02/09/13 0952      Assessment/Plan: s/p Procedure(s): EXPLORATORY LAPAROTOMY WITH incisional biopsy intra- ABDOMINAL MASS ILOCECTOMY  (N/A) INSERTION PORT-A-CATH (N/A)  D/c IV Repeat lasix Replace K+ po Regular diet  LOS: 10 days    John [name] A 02/19/2013

## 2013-02-20 NOTE — Discharge Summary (Signed)
Physician Discharge Summary  Patient ID: DOMONICK FILTER MRN: [id number] DOB/AGE: 1951-08-23 62 y.o.  Admit date: 02/09/2013 Discharge date: 02/20/2013  Admission Diagnoses:  Discharge Diagnoses:  Active Problems:   * No active hospital problems. * Intraabdominal lymphoma with mass Post op ileus  Discharged Condition: good  Hospital Course: admitted s/p ileocolectomy and port a cath for lymphoma post op.  Open wound with wet to dry dressing changes eventually changed to a wound VAC.  Developed a post op ileus.  Eventually resolved slowly and diet was slowly advanced.  Discharged having bm's and tolerating a regular diet  Consults: None  Significant Diagnostic Studies:   Treatments: surgery:   Discharge Exam: Blood pressure 141/58, pulse 88, temperature 99 F (37.2 C), temperature source Oral, resp. rate 18, height 6' (1.829 m), weight 252 lb 0.7 oz (114.325 kg), SpO2 97.00%. General appearance: alert, cooperative and no distress Resp: clear to auscultation bilaterally Cardio: regular rate and rhythm, S1, S2 normal, no murmur, click, rub or gallop Incision/Wound: wound with VAC, JP drain removed  Disposition: 01-Home or Self Care   Future Appointments Provider Department Dept Phone   03/06/2013 9:45 AM [place] [id number]   03/06/2013 10:15 AM Wyatt Portela, MD [place] [phone]       Medication List    TAKE these medications       amLODipine 10 MG tablet  Commonly known as:  NORVASC  Take 5 mg by mouth daily. Patient takes in the pm     aspirin 81 MG tablet  Take 81 mg by mouth daily.     beta carotene w/minerals tablet  Take 1 tablet by mouth daily.     digoxin 0.25 MG tablet  Commonly known as:  LANOXIN  Take 0.25 mg by mouth every morning.     docusate sodium 100 MG capsule  Commonly known as:  COLACE  Take 100 mg by mouth daily.     esomeprazole 40 MG capsule   Commonly known as:  NEXIUM  Take 40 mg by mouth at bedtime.     ezetimibe-simvastatin 10-40 MG per tablet  Commonly known as:  VYTORIN  Take 1 tablet by mouth at bedtime.     Fish Oil 1000 MG Caps  Take 1 capsule by mouth 2 (two) times daily.     furosemide 40 MG tablet  Commonly known as:  LASIX  Take 40 mg by mouth daily.     glipiZIDE 10 MG 24 hr tablet  Commonly known as:  GLUCOTROL XL  Take 10 mg by mouth daily.     iron polysaccharides 150 MG capsule  Commonly known as:  NIFEREX  Take 150 mg by mouth daily.     LANTUS 100 UNIT/ML injection  Generic drug:  insulin glargine  Inject 85 Units into the skin at bedtime.     metFORMIN 1000 MG tablet  Commonly known as:  GLUCOPHAGE  Take 1,000 mg by mouth 2 (two) times daily.     metoprolol 200 MG 24 hr tablet  Commonly known as:  TOPROL-XL  Take 200 mg by mouth daily after breakfast.     nitroGLYCERIN 0.4 MG SL tablet  Commonly known as:  NITROSTAT  Place 0.4 mg under the tongue every 5 (five) minutes as needed for chest pain.     Osteo Bi-Flex Adv Triple St Tabs  Take 500 each by mouth 2 (two) times daily.  oxyCODONE-acetaminophen 5-325 MG per tablet  Commonly known as:  ROXICET  Take 2 tablets by mouth every 4 (four) hours as needed for pain.     potassium chloride SA 20 MEQ tablet  Commonly known as:  K-DUR,KLOR-CON  Take 10 mEq by mouth daily.     ramipril 5 MG capsule  Commonly known as:  ALTACE  Take 5 mg by mouth 2 (two) times daily.     sildenafil 100 MG tablet  Commonly known as:  VIAGRA  Take 100 mg by mouth as needed.     sitaGLIPtin 100 MG tablet  Commonly known as:  JANUVIA  Take 100 mg by mouth daily.     warfarin 5 MG tablet  Commonly known as:  COUMADIN  Take 5-7.5 mg by mouth daily. Wed take 7.5 mg, and 5 mg on all other days     zolpidem 10 MG tablet  Commonly known as:  AMBIEN  Take 1 tablet (10 mg total) by mouth at bedtime as needed for sleep.           Follow-up  Information   Follow up with [facility], MD In 2 weeks. (ask for [facility])    Contact information:   [address] [place] [address] [phone]       Signed: Harl Bowie 02/20/2013, 10:13 AM

## 2013-02-23 ENCOUNTER — Telehealth (INDEPENDENT_AMBULATORY_CARE_PROVIDER_SITE_OTHER): Payer: Self-pay

## 2013-02-23 NOTE — Telephone Encounter (Signed)
The patient's wife called to schedule 2 week f/u.  Call [phone].  Dr Trevor Mace patient.

## 2013-02-24 ENCOUNTER — Other Ambulatory Visit: Payer: BC Managed Care – PPO | Admitting: Lab

## 2013-02-24 ENCOUNTER — Ambulatory Visit: Payer: BC Managed Care – PPO | Admitting: Oncology

## 2013-02-27 ENCOUNTER — Other Ambulatory Visit (INDEPENDENT_AMBULATORY_CARE_PROVIDER_SITE_OTHER): Payer: Self-pay

## 2013-02-27 ENCOUNTER — Telehealth (INDEPENDENT_AMBULATORY_CARE_PROVIDER_SITE_OTHER): Payer: Self-pay

## 2013-02-27 DIAGNOSIS — R109 Unspecified abdominal pain: Secondary | ICD-10-CM

## 2013-02-27 MED ORDER — HYDROCODONE-ACETAMINOPHEN 5-325 MG PO TABS: 1.0000 | ORAL_TABLET | Freq: Four times a day (QID) | ORAL | Status: DC | PRN

## 2013-02-27 NOTE — Telephone Encounter (Signed)
Patient is requesting pain medication; Norco 5/325mg  1po q4-6hrs/prn pain # 30 no refills per standing orders [facility] [phone]

## 2013-03-06 ENCOUNTER — Other Ambulatory Visit (HOSPITAL_BASED_OUTPATIENT_CLINIC_OR_DEPARTMENT_OTHER): Payer: BC Managed Care – PPO | Admitting: Lab

## 2013-03-06 ENCOUNTER — Encounter (HOSPITAL_COMMUNITY)
Admission: RE | Admit: 2013-03-06 | Discharge: 2013-03-06 | Disposition: A | Discharge status: Home / Self Care | Payer: BC Managed Care – PPO | Source: Ambulatory Visit | Attending: Oncology | Admitting: Oncology

## 2013-03-06 ENCOUNTER — Other Ambulatory Visit: Payer: BC Managed Care – PPO | Admitting: Lab

## 2013-03-06 ENCOUNTER — Ambulatory Visit (HOSPITAL_BASED_OUTPATIENT_CLINIC_OR_DEPARTMENT_OTHER): Payer: BC Managed Care – PPO | Admitting: Oncology

## 2013-03-06 ENCOUNTER — Ambulatory Visit (HOSPITAL_BASED_OUTPATIENT_CLINIC_OR_DEPARTMENT_OTHER): Payer: BC Managed Care – PPO

## 2013-03-06 ENCOUNTER — Telehealth: Payer: Self-pay | Admitting: Oncology

## 2013-03-06 VITALS — BP 130/53 | HR 63 | Temp 97.0°F | Resp 20 | Ht 72.0 in | Wt 245.4 lb

## 2013-03-06 VITALS — BP 151/46 | HR 74 | Temp 98.0°F | Resp 20

## 2013-03-06 DIAGNOSIS — C8299 Follicular lymphoma, unspecified, extranodal and solid organ sites: Secondary | ICD-10-CM

## 2013-03-06 DIAGNOSIS — R1900 Intra-abdominal and pelvic swelling, mass and lump, unspecified site: Secondary | ICD-10-CM

## 2013-03-06 DIAGNOSIS — D649 Anemia, unspecified: Secondary | ICD-10-CM

## 2013-03-06 DIAGNOSIS — E785 Hyperlipidemia, unspecified: Secondary | ICD-10-CM

## 2013-03-06 LAB — CBC WITH DIFFERENTIAL/PLATELET
BASO%: 0.7 % (ref 0.0–2.0)
Basophils Absolute: 0.1 10*3/uL (ref 0.0–0.1)
EOS%: 3.1 % (ref 0.0–7.0)
Eosinophils Absolute: 0.4 10*3/uL (ref 0.0–0.5)
HCT: 25.6 % — ABNORMAL LOW (ref 38.4–49.9)
HGB: 8 g/dL — ABNORMAL LOW (ref 13.0–17.1)
LYMPH%: 7.4 % — ABNORMAL LOW (ref 14.0–49.0)
MCH: 22 pg — ABNORMAL LOW (ref 27.2–33.4)
MCHC: 31.3 g/dL — ABNORMAL LOW (ref 32.0–36.0)
MCV: 70.3 fL — ABNORMAL LOW (ref 79.3–98.0)
MONO#: 1.2 10*3/uL — ABNORMAL HIGH (ref 0.1–0.9)
MONO%: 10.1 % (ref 0.0–14.0)
NEUT#: 9.5 10*3/uL — ABNORMAL HIGH (ref 1.5–6.5)
NEUT%: 78.7 % — ABNORMAL HIGH (ref 39.0–75.0)
Platelets: 426 10*3/uL — ABNORMAL HIGH (ref 140–400)
RBC: 3.64 10*6/uL — ABNORMAL LOW (ref 4.20–5.82)
RDW: 21.5 % — ABNORMAL HIGH (ref 11.0–14.6)
WBC: 12 10*3/uL — ABNORMAL HIGH (ref 4.0–10.3)
lymph#: 0.9 10*3/uL (ref 0.9–3.3)

## 2013-03-06 LAB — COMPREHENSIVE METABOLIC PANEL (CC13)
ALT: 7 U/L (ref 0–55)
AST: 14 U/L (ref 5–34)
Albumin: 2.5 g/dL — ABNORMAL LOW (ref 3.5–5.0)
Alkaline Phosphatase: 119 U/L (ref 40–150)
BUN: 16 mg/dL (ref 7.0–26.0)
CO2: 26 mEq/L (ref 22–29)
Calcium: 9 mg/dL (ref 8.4–10.4)
Chloride: 102 mEq/L (ref 98–109)
Creatinine: 1 mg/dL (ref 0.7–1.3)
Glucose: 102 mg/dl (ref 70–140)
Potassium: 5.2 mEq/L — ABNORMAL HIGH (ref 3.5–5.1)
Sodium: 139 mEq/L (ref 136–145)
Total Bilirubin: 0.39 mg/dL (ref 0.20–1.20)
Total Protein: 7.4 g/dL (ref 6.4–8.3)

## 2013-03-06 LAB — HOLD TUBE, BLOOD BANK

## 2013-03-06 LAB — PREPARE RBC (CROSSMATCH)

## 2013-03-06 MED ORDER — LIDOCAINE-PRILOCAINE 2.5-2.5 % EX CREA: TOPICAL_CREAM | CUTANEOUS | Status: DC | PRN

## 2013-03-06 MED ORDER — FUROSEMIDE 10 MG/ML IJ SOLN
20.0000 mg | Freq: Once | INTRAMUSCULAR | Status: AC
  Administered 2013-03-06: 20 mg via INTRAVENOUS

## 2013-03-06 MED ORDER — SODIUM CHLORIDE 0.9 % IJ SOLN
10.0000 mL | INTRAMUSCULAR | Status: DC | PRN
  Filled 2013-03-06: qty 10

## 2013-03-06 MED ORDER — DIPHENHYDRAMINE HCL 25 MG PO CAPS
25.0000 mg | ORAL_CAPSULE | Freq: Once | ORAL | Status: AC
  Administered 2013-03-06: 25 mg via ORAL

## 2013-03-06 MED ORDER — HEPARIN SOD (PORK) LOCK FLUSH 100 UNIT/ML IV SOLN
500.0000 [IU] | Freq: Once | INTRAVENOUS | Status: AC
  Administered 2013-03-06: 500 [IU] via INTRAVENOUS
  Filled 2013-03-06: qty 5

## 2013-03-06 MED ORDER — SODIUM CHLORIDE 0.9 % IJ SOLN
10.0000 mL | INTRAMUSCULAR | Status: DC | PRN
  Administered 2013-03-06: 10 mL via INTRAVENOUS
  Filled 2013-03-06: qty 10

## 2013-03-06 MED ORDER — ACETAMINOPHEN 325 MG PO TABS
650.0000 mg | ORAL_TABLET | Freq: Once | ORAL | Status: AC
  Administered 2013-03-06: 650 mg via ORAL

## 2013-03-06 MED ORDER — HYDROCODONE-ACETAMINOPHEN 5-325 MG PO TABS
1.0000 | ORAL_TABLET | Freq: Once | ORAL | Status: AC
  Administered 2013-03-06: 1 via ORAL

## 2013-03-06 NOTE — Telephone Encounter (Signed)
gv and printed appt sched for pt.

## 2013-03-06 NOTE — Progress Notes (Signed)
Hematology and Oncology Follow Up Visit  John [name] [id number] 11/12/1950 62 y.o. 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   Prior Therapy: He is status post exploratory laparotomy and excision of an intra-abdominal mass and ileocolectomy in a Port-A-Cath placement on 02/09/2013  Current therapy:  Under consideration to start systemic chemotherapy.  Interim History:  John [name] presents today for a followup visit. He is a pleasant man and seen for followup after his recent operation and excision of an intra-abdominal mass. The pathology confirmed the presence of diffuse large cell lymphoma and he is currently recovering from his operation. His postop course was significant for an ileus and currently in abdominal wall and with a wound VAC. It appears appears to be healing slowly but well and he is on a course of recovery at this time. He still  reports work showed a swelling some fatigue and tiredness but otherwise continue to recover without any major complications.  Medications: I have reviewed the patient's current medications.  Current Outpatient Prescriptions  Medication Sig Dispense Refill  . amLODipine (NORVASC) 10 MG tablet Take 5 mg by mouth daily. Patient takes in the pm      . aspirin 81 MG tablet Take 81 mg by mouth daily.       . beta carotene w/minerals (OCUVITE) tablet Take 1 tablet by mouth daily.       . digoxin (LANOXIN) 0.25 MG tablet Take 0.25 mg by mouth every morning.       . docusate sodium (COLACE) 100 MG capsule Take 100 mg by mouth daily.      Marland Kitchen esomeprazole (NEXIUM) 40 MG capsule Take 40 mg by mouth at bedtime.       Marland Kitchen ezetimibe-simvastatin (VYTORIN) 10-40 MG per tablet Take 1 tablet  by mouth at bedtime.      . furosemide (LASIX) 40 MG tablet Take 40 mg by mouth daily.       Marland Kitchen glipiZIDE (GLUCOTROL XL) 10 MG 24 hr tablet Take 10 mg by mouth daily.       Marland Kitchen HYDROcodone-acetaminophen (NORCO) 5-325 MG per tablet Take 1 tablet by mouth every 6 (six) hours as needed for pain.  30 tablet  0  . insulin glargine (LANTUS) 100 UNIT/ML injection Inject 85 Units into the skin at bedtime.       . iron polysaccharides (NIFEREX) 150 MG capsule Take 150 mg by mouth daily.      . metFORMIN (GLUCOPHAGE) 1000 MG tablet Take 1,000 mg by mouth 2 (two) times daily.       . metoprolol (TOPROL-XL) 200 MG 24 hr tablet Take 200 mg by mouth daily after breakfast.       . Misc Natural Products (OSTEO BI-FLEX ADV TRIPLE ST) TABS Take 500 each by mouth 2 (two) times daily.       . nitroGLYCERIN (NITROSTAT) 0.4 MG SL tablet Place 0.4 mg under the tongue every 5 (five) minutes as needed for chest pain.      . Omega-3 Fatty Acids (FISH OIL) 1000 MG CAPS Take 1 capsule by mouth 2 (two) times daily.       Marland Kitchen oxyCODONE-acetaminophen (ROXICET) 5-325 MG per tablet Take 2 tablets by mouth every 4 (four) hours as needed for pain.  [place]  tablet  0  . potassium chloride SA (K-DUR,KLOR-CON) 20 MEQ tablet Take 10 mEq by mouth daily.       . ramipril (ALTACE) 5 MG capsule Take 5 mg by mouth 2 (two) times daily.       . sildenafil (VIAGRA) 100 MG tablet Take 100 mg by mouth as needed.      . sitaGLIPtin (JANUVIA) 100 MG tablet Take 100 mg by mouth daily.       Marland Kitchen warfarin (COUMADIN) 5 MG tablet Take 5-7.5 mg by mouth daily. Wed take 7.5 mg, and 5 mg on all other days      . zolpidem (AMBIEN) 10 MG tablet Take 1 tablet (10 mg total) by mouth at bedtime as needed for sleep.  30 tablet  0   No current facility-administered medications for this visit.     Allergies: No Known Allergies  Past Medical History, Surgical history, Social history, and Family History were reviewed and updated.  Review of Systems: Constitutional:   Negative for fever, chills, night sweats, anorexia, weight loss, pain. Cardiovascular: no chest pain or dyspnea on exertion Respiratory: negative Neurological: negative Dermatological: negative ENT: negative Skin: Negative. Gastrointestinal: negative Genito-Urinary: negative Hematological and Lymphatic: negative Breast: negative Musculoskeletal: negative Remaining ROS negative. Physical Exam: Blood pressure 130/53, pulse 63, temperature 97 F (36.1 C), temperature source Oral, resp. rate 20, height 6' (1.829 m), weight 245 lb 6.4 oz (111.313 kg). ECOG: 1 General appearance: alert Head: Normocephalic, without obvious abnormality, atraumatic Neck: no adenopathy, no carotid bruit, no JVD, supple, symmetrical, trachea midline and thyroid not enlarged, symmetric, no tenderness/mass/nodules Lymph nodes: Cervical, supraclavicular, and axillary nodes normal. Heart:regular rate and rhythm, S1, S2 normal, no murmur, click, rub or gallop Lung:chest clear, no wheezing, rales, normal symmetric air entry Abdomin: soft, non-tender, without masses or organomegaly EXT:no erythema, induration, or nodules Abdominal wound appear to be healing with a wound VAC.  Lab Results: Lab Results  Component Value Date   WBC 12.0* 03/06/2013   HGB 8.0* 03/06/2013   HCT 25.6* 03/06/2013   MCV 70.3* 03/06/2013   PLT 426* 03/06/2013     Chemistry      Component Value Date/Time   NA 139 03/06/2013 0935   NA 142 02/19/2013 0529   K 5.2* 03/06/2013 0935   K 3.2* 02/19/2013 0529   CL 107 02/19/2013 0529   CL 106 01/21/2013 1050   CO2 26 03/06/2013 0935   CO2 23 02/19/2013 0529   BUN 16.0 03/06/2013 0935   BUN 12 02/19/2013 0529   CREATININE 1.0 03/06/2013 0935   CREATININE 0.77 02/19/2013 0529      Component Value Date/Time   CALCIUM 9.0 03/06/2013 0935   CALCIUM 8.0* 02/19/2013 0529   ALKPHOS 119 03/06/2013 0935   AST 14 03/06/2013 0935   ALT 7 03/06/2013 0935   BILITOT 0.39 03/06/2013 0935        Impression and  Plan: 62 year old gentleman with the following issues:  1. 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 He is status post excisional biopsy and ileocolectomy which confirmed the presence of this lymphoma. He'll eventually will need systemic chemotherapy likely in the form of CHOP with rituximab. However, he needs to heal properly from his operation and his abdominal wound has to recover which appears to be doing so but not quite there at this time. We'll have him come in the near future to reassess that and will restart chemotherapy at that time.  2. Anemia: Probably multifactorial in nature related to his recent operation and lymphoma. He is symptomatic and for that reason I will set him up for a packed red cell transfusion today with Lasix in between units.  3. Abdominal wound: Appears to be healing nicely but not quite ready to restart chemotherapy we'll assess that in 3 weeks and will make a decision at that time.   [facility], MD 6/27/201410:27 AM

## 2013-03-07 LAB — TYPE AND SCREEN
ABO/RH(D): A POS
Antibody Screen: NEGATIVE
Unit division: 0
Unit division: 0

## 2013-03-10 ENCOUNTER — Ambulatory Visit (INDEPENDENT_AMBULATORY_CARE_PROVIDER_SITE_OTHER): Payer: BC Managed Care – PPO | Admitting: Surgery

## 2013-03-10 ENCOUNTER — Encounter (INDEPENDENT_AMBULATORY_CARE_PROVIDER_SITE_OTHER): Payer: Self-pay | Admitting: Surgery

## 2013-03-10 VITALS — BP 142/68 | HR 72 | Temp 98.2°F | Resp 14 | Ht 72.0 in | Wt 241.0 lb

## 2013-03-10 DIAGNOSIS — Z09 Encounter for follow-up examination after completed treatment for conditions other than malignant neoplasm: Secondary | ICD-10-CM

## 2013-03-10 NOTE — Progress Notes (Signed)
Subjective:     Patient ID: John [name], male   DOB: 01-03-1951, 62 y.o.   MRN: [id number]  HPI He is here for his first postop visit status post excision of a large interabdominal lymphoma. He is now seeing the oncologist again and had a transfusion of packed red blood cells. He currently has a wound VAC in place. His energy is low but he is moving his bowels and has had no fevers  Review of Systems     Objective:   Physical Exam On exam, his incision has a wound VAC in place. His wife took a picture of it yesterday and the wound shows excellent granulation tissue.    Assessment:     Patient stable postop     Plan:     He will refrain from heavy lifting for 2 more weeks. I renewed his hydrocodone. We will continue the wound VAC and I will see him back in 3 weeks

## 2013-03-13 ENCOUNTER — Telehealth (INDEPENDENT_AMBULATORY_CARE_PROVIDER_SITE_OTHER): Payer: Self-pay | Admitting: Surgery

## 2013-03-13 NOTE — Telephone Encounter (Signed)
Nurse with [facility]) called with wound vac change today noting 3mL pocket of thin fluid on change - most of wound was granulating well.  Ans service down so call delayed 6hrs - RN had left by then.  Pt otherwise doing well & was seen in office 3 days ago.   I recommended pt calling us if feels worse but otherwise continue dressing changes for now

## 2013-03-16 ENCOUNTER — Telehealth (INDEPENDENT_AMBULATORY_CARE_PROVIDER_SITE_OTHER): Payer: Self-pay | Admitting: General Surgery

## 2013-03-16 NOTE — Telephone Encounter (Signed)
Hoyle Sauer, nurse with [facility], called to report on patient's wound. Saturday the nurse went out to to change wound and there was 15-30 cc of yellow drainage that came out of the distal end of patient's midline wound. The nurse today did not notice any drainage from midline wound - the wound bed looked great. She did notice a small amount of purulent drainage from the old drain site but area is barely open now, no redness, no fevers. Nurse has been covering this with dry gauze after washing with normal saline. I advised her to continue this and I will pass the message to Dr Ninfa Linden. We will call back with any questions. [phone].

## 2013-03-26 ENCOUNTER — Other Ambulatory Visit: Payer: Self-pay | Admitting: *Deleted

## 2013-03-26 ENCOUNTER — Ambulatory Visit (HOSPITAL_BASED_OUTPATIENT_CLINIC_OR_DEPARTMENT_OTHER): Payer: BC Managed Care – PPO | Admitting: Oncology

## 2013-03-26 ENCOUNTER — Other Ambulatory Visit (HOSPITAL_BASED_OUTPATIENT_CLINIC_OR_DEPARTMENT_OTHER): Payer: BC Managed Care – PPO | Admitting: Lab

## 2013-03-26 ENCOUNTER — Telehealth: Payer: Self-pay | Admitting: Oncology

## 2013-03-26 VITALS — BP 141/55 | HR 57 | Temp 97.0°F | Resp 17 | Ht 72.0 in | Wt 227.8 lb

## 2013-03-26 DIAGNOSIS — C8299 Follicular lymphoma, unspecified, extranodal and solid organ sites: Secondary | ICD-10-CM

## 2013-03-26 DIAGNOSIS — R1900 Intra-abdominal and pelvic swelling, mass and lump, unspecified site: Secondary | ICD-10-CM

## 2013-03-26 DIAGNOSIS — D649 Anemia, unspecified: Secondary | ICD-10-CM

## 2013-03-26 LAB — CBC WITH DIFFERENTIAL/PLATELET
BASO%: 1.4 % (ref 0.0–2.0)
Basophils Absolute: 0.1 10*3/uL (ref 0.0–0.1)
EOS%: 3.4 % (ref 0.0–7.0)
Eosinophils Absolute: 0.3 10*3/uL (ref 0.0–0.5)
HCT: 32.8 % — ABNORMAL LOW (ref 38.4–49.9)
HGB: 10.5 g/dL — ABNORMAL LOW (ref 13.0–17.1)
LYMPH%: 13.5 % — ABNORMAL LOW (ref 14.0–49.0)
MCH: 23.7 pg — ABNORMAL LOW (ref 27.2–33.4)
MCHC: 32.1 g/dL (ref 32.0–36.0)
MCV: 73.9 fL — ABNORMAL LOW (ref 79.3–98.0)
MONO#: 1.1 10*3/uL — ABNORMAL HIGH (ref 0.1–0.9)
MONO%: 15 % — ABNORMAL HIGH (ref 0.0–14.0)
NEUT#: 5 10*3/uL (ref 1.5–6.5)
NEUT%: 66.7 % (ref 39.0–75.0)
Platelets: 317 10*3/uL (ref 140–400)
RBC: 4.44 10*6/uL (ref 4.20–5.82)
RDW: 22.4 % — ABNORMAL HIGH (ref 11.0–14.6)
WBC: 7.5 10*3/uL (ref 4.0–10.3)
lymph#: 1 10*3/uL (ref 0.9–3.3)

## 2013-03-26 LAB — COMPREHENSIVE METABOLIC PANEL (CC13)
ALT: 11 U/L (ref 0–55)
AST: 16 U/L (ref 5–34)
Albumin: 3.2 g/dL — ABNORMAL LOW (ref 3.5–5.0)
Alkaline Phosphatase: 111 U/L (ref 40–150)
BUN: 32.8 mg/dL — ABNORMAL HIGH (ref 7.0–26.0)
CO2: 26 mEq/L (ref 22–29)
Calcium: 9.8 mg/dL (ref 8.4–10.4)
Chloride: 103 mEq/L (ref 98–109)
Creatinine: 1.3 mg/dL (ref 0.7–1.3)
Glucose: 162 mg/dl — ABNORMAL HIGH (ref 70–140)
Potassium: 5.6 mEq/L — ABNORMAL HIGH (ref 3.5–5.1)
Sodium: 139 mEq/L (ref 136–145)
Total Bilirubin: 0.43 mg/dL (ref 0.20–1.20)
Total Protein: 7.6 g/dL (ref 6.4–8.3)

## 2013-03-26 LAB — HOLD TUBE, BLOOD BANK

## 2013-03-26 MED ORDER — PREDNISONE 50 MG PO TABS: ORAL_TABLET | ORAL | Status: DC

## 2013-03-26 MED ORDER — LIDOCAINE-PRILOCAINE 2.5-2.5 % EX CREA: TOPICAL_CREAM | CUTANEOUS | Status: DC | PRN

## 2013-03-26 MED ORDER — ONDANSETRON HCL 8 MG PO TABS: 8.0000 mg | ORAL_TABLET | Freq: Three times a day (TID) | ORAL | Status: DC | PRN

## 2013-03-26 NOTE — Telephone Encounter (Signed)
gv pt appt schedule for July thru September. [facility] will call pt w/ct appt. Pt aware.

## 2013-03-26 NOTE — Progress Notes (Signed)
Hematology and Oncology Follow Up Visit  John [name] [id number] 1951/09/10 62 y.o. 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   Prior Therapy: He is status post exploratory laparotomy and excision of an intra-abdominal mass and ileocolectomy in a Port-A-Cath placement on 02/09/2013  Current therapy:  Under consideration to start systemic chemotherapy.  Interim History:  [name] presents today for a followup visit. He is a pleasant man and seen for followup after his recent operation and excision of an intra-abdominal mass. The pathology confirmed the presence of diffuse large cell lymphoma and he is currently recovering from his operation. Currently, his abdominal wound is healing well with a wound VAC. He still  reports some fatigue and tiredness but otherwise continue to recover without any major complications. His LE swelling is improved. He is clinically a lot better at this time.   Medications: I have reviewed the patient's current medications.  Current Outpatient Prescriptions  Medication Sig Dispense Refill  . amLODipine (NORVASC) 10 MG tablet Take 5 mg by mouth daily. Patient takes in the pm      . aspirin 81 MG tablet Take 81 mg by mouth daily.       . beta carotene w/minerals (OCUVITE) tablet Take 1 tablet by mouth daily.       . digoxin (LANOXIN) 0.25 MG tablet Take 0.25 mg by mouth every morning.       . docusate sodium (COLACE) 100 MG capsule Take 100 mg by mouth daily.      Marland Kitchen esomeprazole (NEXIUM) 40 MG capsule Take 40 mg by mouth at bedtime.       Marland Kitchen ezetimibe-simvastatin (VYTORIN) 10-40 MG per tablet Take 1 tablet by mouth at bedtime.      . furosemide (LASIX) 40 MG tablet Take 40 mg by mouth  daily.       Marland Kitchen glipiZIDE (GLUCOTROL XL) 10 MG 24 hr tablet Take 10 mg by mouth daily.       Marland Kitchen HYDROcodone-acetaminophen (NORCO) 5-325 MG per tablet Take 1 tablet by mouth every 6 (six) hours as needed for pain.  30 tablet  0  . insulin glargine (LANTUS) 100 UNIT/ML injection Inject 85 Units into the skin at bedtime.       . iron polysaccharides (NIFEREX) 150 MG capsule Take 150 mg by mouth daily.      . metFORMIN (GLUCOPHAGE) 1000 MG tablet Take 1,000 mg by mouth 2 (two) times daily.       . metoprolol (TOPROL-XL) 200 MG 24 hr tablet Take 200 mg by mouth daily after breakfast.       . Misc Natural Products (OSTEO BI-FLEX ADV TRIPLE ST) TABS Take 500 each by mouth 2 (two) times daily.       . nitroGLYCERIN (NITROSTAT) 0.4 MG SL tablet Place 0.4 mg under the tongue every 5 (five) minutes as needed for chest pain.      . Omega-3 Fatty Acids (FISH OIL) 1000 MG CAPS Take 1 capsule by mouth 2 (two) times daily.       Marland Kitchen oxyCODONE-acetaminophen (ROXICET) 5-325 MG per tablet Take 2 tablets by mouth every 4 (four) hours as needed for pain.  60 tablet  0  . potassium chloride SA (K-DUR,KLOR-CON) 20 MEQ tablet Take 10 mEq by  mouth daily.       . ramipril (ALTACE) 5 MG capsule Take 5 mg by mouth 2 (two) times daily.       . sildenafil (VIAGRA) 100 MG tablet Take 100 mg by mouth as needed.      . sitaGLIPtin (JANUVIA) 100 MG tablet Take 100 mg by mouth daily.       Marland Kitchen warfarin (COUMADIN) 5 MG tablet Take 5-7.5 mg by mouth daily. Wed take 7.5 mg, and 5 mg on all other days      . lidocaine-prilocaine (EMLA) cream Apply topically as needed.  30 g  0  . lidocaine-prilocaine (EMLA) cream Apply topically as needed. Apply approximately 1/2 teaspoon to skin over port, prior to chemotherapy treatment. Do NOT rub cream in. Cover with plastic saran wrap or press and seal. No gauze.  30 g  1  . ondansetron (ZOFRAN) 8 MG tablet Take 1 tablet (8 mg total) by mouth every 8 (eight) hours as needed for nausea.  20 tablet  0  .  predniSONE (DELTASONE) 50 MG tablet Take 2 tablets for 5 days with each chemotherapy cycle.  60 tablet  1  . zolpidem (AMBIEN) 10 MG tablet Take 1 tablet (10 mg total) by mouth at bedtime as needed for sleep.  30 tablet  0   No current facility-administered medications for this visit.     Allergies: No Known Allergies  Past Medical History, Surgical history, Social history, and Family History were reviewed and updated.  Review of Systems: Constitutional:  Negative for fever, chills, night sweats, anorexia, weight loss, pain. Cardiovascular: no chest pain or dyspnea on exertion Respiratory: negative Neurological: negative Dermatological: negative ENT: negative Skin: Negative. Gastrointestinal: negative Genito-Urinary: negative Hematological and Lymphatic: negative Breast: negative Musculoskeletal: negative Remaining ROS negative. Physical Exam: Blood pressure 141/55, pulse 57, temperature 97 F (36.1 C), temperature source Oral, resp. rate 17, height 6' (1.829 m), weight 227 lb 12.8 oz (103.329 kg). ECOG: 1 General appearance: alert Head: Normocephalic, without obvious abnormality, atraumatic Neck: no adenopathy, no carotid bruit, no JVD, supple, symmetrical, trachea midline and thyroid not enlarged, symmetric, no tenderness/mass/nodules Lymph nodes: Cervical, supraclavicular, and axillary nodes normal. Heart:regular rate and rhythm, S1, S2 normal, no murmur, click, rub or gallop Lung:chest clear, no wheezing, rales, normal symmetric air entry Abdomin: soft, non-tender, without masses or organomegaly EXT:no erythema, induration, or nodules Abdominal wound appear to be healing with a wound VAC.  Lab Results: Lab Results  Component Value Date   WBC 7.5 03/26/2013   HGB 10.5* 03/26/2013   HCT 32.8* 03/26/2013   MCV 73.9* 03/26/2013   PLT 317 03/26/2013     Chemistry      Component Value Date/Time   NA 139 03/26/2013 1355   NA 142 02/19/2013 0529   K 5.6* 03/26/2013 1355   K  3.2* 02/19/2013 0529   CL 107 02/19/2013 0529   CL 106 01/21/2013 1050   CO2 26 03/26/2013 1355   CO2 23 02/19/2013 0529   BUN 32.8* 03/26/2013 1355   BUN 12 02/19/2013 0529   CREATININE 1.3 03/26/2013 1355   CREATININE 0.77 02/19/2013 0529      Component Value Date/Time   CALCIUM 9.8 03/26/2013 1355   CALCIUM 8.0* 02/19/2013 0529   ALKPHOS 111 03/26/2013 1355   AST 16 03/26/2013 1355   ALT 11 03/26/2013 1355   BILITOT 0.43 03/26/2013 1355        Impression and Plan: 62 year old gentleman with the following issues:  1. Stage  IIA diffuse large cell lymphoma presented with abdominal mass and small intestinal involvement. He is status post excisional biopsy and ileocolectomy which confirmed the presence of this lymphoma. He'll eventually will need systemic chemotherapy likely in the form of CHOP with rituximab. He is very close to get that started. I anticipate getting that started on 8/7 if he is cleared by Dr. Ninfa Linden.  I discussed the risks and benefits of systemic chemotherapy utilizing CHOP with rituximab. Complications include nausea, vomiting, infusion-related complications, myelosuppression, neutropenia, neutropenic sepsis and possible need for hospitalization and intravenous antibiotics. Other complications Will include fatigue tiredness and possible peripheral neuropathy. Cardiac toxicity such as cardiomyopathy associated with Adriamycin. I also elaborated on the infusion-related toxicity associated with rituximab and the need for slow infusion the complications related to that that would include pruritus, hives and rarely anaphylaxis. I also discussed with them the role of prednisone as a part of this chemotherapy regimen in the complications associated with that includes hyperglycemia and dyspepsia. I also discussed with him the role of growth factor support in the form of Neulasta for neutropenic prophylaxis. All his questions were answered today and anticipate to start as mentioned on  04/16/2013. I will set him up with chemotherapy education class as well as a restaging CT scan before the start of chemotherapy.  2. Anemia: Probably multifactorial in nature related to his recent operation and lymphoma. Much improved.   3. Abdominal wound: Appears to be healing nicely but not quite ready to restart chemotherapy I hope he will be ready in 3 weeks.  4. IV access: A Port-A-Cath have been placed already and have been accessed 3 weeks ago and should be ready to proceed in any case. I've given a prescription for an EMLA cream as well.  5. Nausea prophylaxis prescription for Zofran was given today.    Zola Button, MD 7/17/20142:52 PM

## 2013-03-27 ENCOUNTER — Other Ambulatory Visit (INDEPENDENT_AMBULATORY_CARE_PROVIDER_SITE_OTHER): Payer: Self-pay | Admitting: *Deleted

## 2013-03-27 ENCOUNTER — Telehealth (INDEPENDENT_AMBULATORY_CARE_PROVIDER_SITE_OTHER): Payer: Self-pay | Admitting: *Deleted

## 2013-03-27 DIAGNOSIS — R109 Unspecified abdominal pain: Secondary | ICD-10-CM

## 2013-03-27 MED ORDER — HYDROCODONE-ACETAMINOPHEN 5-325 MG PO TABS: 1.0000 | ORAL_TABLET | Freq: Four times a day (QID) | ORAL | Status: DC | PRN

## 2013-03-27 NOTE — Telephone Encounter (Signed)
Patient's wife called today to ask for a refill of his pain medication.  She reports that patient continues to have pain at the surgical site.  She reports that patient is only using the Norco 1 tablet about every 6 hours and Ibuprofen in between.  Wife reports at the worst patient's pain is 6-7/10.  Wife reports patient is trying to go longer and taper off using the Norco but there are definitely still times during the day he still needs it.  Patient has already had his 1 per protocol refill on 02/27/13, patient's original surgery was 02/09/13 for a exploratory lap with incisional biopsy intra-abdominal mass ilocectomy.  Patient is due for another PO with Dr. Ninfa Linden on 04/07/13.  Wife denies any other symptoms of nausea, vomiting, etc.  Wife also denies any symptoms of infection such as fever, redness at the site, or drainage.  Due to Dr. Ninfa Linden being unavailable today this RN will approach another MD in clinic to see if they feel it appropriate to refill Norco again.

## 2013-03-27 NOTE — Telephone Encounter (Signed)
Spoke with Dr. Margot Chimes regarding this patient and he reviewed the below note.  Dr. Margot Chimes feels it is appropriate to go ahead and give a refill of the Norco 5/325mg  1 tablet every 6 hours as needed for pain #30 no refills.  Prescription called into [facility] in [place].  Wife updated at this time.

## 2013-04-01 ENCOUNTER — Telehealth (INDEPENDENT_AMBULATORY_CARE_PROVIDER_SITE_OTHER): Payer: Self-pay | Admitting: *Deleted

## 2013-04-01 NOTE — Telephone Encounter (Signed)
Can stop vac and use wet to dry NS q day

## 2013-04-01 NOTE — Telephone Encounter (Signed)
John [name] with [facility] updated with new order to D/C wound vac and use NS wet-dry dressing changes daily.  John [name] states understanding of verbal order and agreeable at this time.

## 2013-04-01 NOTE — Telephone Encounter (Signed)
John [name] with [facility] called to report that patient's wound has scant drainage and is healing very well.  She states patient doesn't come back to see Dr. Ninfa Linden until next week however she believes the wound looks so good the wound vac should be removed and change order to dressing changes.  Sending message to Dr. Ninfa Linden for approval and what type of dressing changes he would like.

## 2013-04-02 ENCOUNTER — Other Ambulatory Visit: Payer: BC Managed Care – PPO

## 2013-04-07 ENCOUNTER — Encounter (INDEPENDENT_AMBULATORY_CARE_PROVIDER_SITE_OTHER): Payer: Self-pay | Admitting: Surgery

## 2013-04-07 ENCOUNTER — Ambulatory Visit (INDEPENDENT_AMBULATORY_CARE_PROVIDER_SITE_OTHER): Payer: BC Managed Care – PPO | Admitting: Surgery

## 2013-04-07 VITALS — BP 120/78 | HR 72 | Temp 98.3°F | Resp 18 | Ht 72.0 in | Wt 221.0 lb

## 2013-04-07 DIAGNOSIS — Z09 Encounter for follow-up examination after completed treatment for conditions other than malignant neoplasm: Secondary | ICD-10-CM

## 2013-04-07 MED ORDER — DOXYCYCLINE HYCLATE 100 MG PO TABS: 100.0000 mg | ORAL_TABLET | Freq: Two times a day (BID) | ORAL | Status: DC

## 2013-04-07 NOTE — Progress Notes (Signed)
Subjective:     Patient ID: John [name], male   DOB: 03-16-1951, 62 y.o.   MRN: [id number]  HPI He is here for another postop visit. Despite his weight loss, he is doing well. The hope is for him to start chemotherapy next week. The wound VAC has stopped but he still has a small open area which is draining purulent fluid.  Review of Systems     Objective:   Physical Exam I probed the area of the Q-tip and treated with silver nitrate. It is approximately an inch deep. The rest of the wound is healed    Assessment:     Chronic open wound     Plan:     I will place him on doxycycline and he will continue the wound packing twice a day. I will see him back in 2 weeks

## 2013-04-14 ENCOUNTER — Other Ambulatory Visit (HOSPITAL_BASED_OUTPATIENT_CLINIC_OR_DEPARTMENT_OTHER): Payer: BC Managed Care – PPO | Admitting: Lab

## 2013-04-14 DIAGNOSIS — C8299 Follicular lymphoma, unspecified, extranodal and solid organ sites: Secondary | ICD-10-CM

## 2013-04-14 DIAGNOSIS — D649 Anemia, unspecified: Secondary | ICD-10-CM

## 2013-04-14 LAB — CBC WITH DIFFERENTIAL/PLATELET
BASO%: 0.3 % (ref 0.0–2.0)
Basophils Absolute: 0 10*3/uL (ref 0.0–0.1)
EOS%: 0.5 % (ref 0.0–7.0)
Eosinophils Absolute: 0.1 10*3/uL (ref 0.0–0.5)
HCT: 36.9 % — ABNORMAL LOW (ref 38.4–49.9)
HGB: 11.9 g/dL — ABNORMAL LOW (ref 13.0–17.1)
LYMPH%: 17.9 % (ref 14.0–49.0)
MCH: 24 pg — ABNORMAL LOW (ref 27.2–33.4)
MCHC: 32.2 g/dL (ref 32.0–36.0)
MCV: 74.5 fL — ABNORMAL LOW (ref 79.3–98.0)
MONO#: 1.6 10*3/uL — ABNORMAL HIGH (ref 0.1–0.9)
MONO%: 11.5 % (ref 0.0–14.0)
NEUT#: 9.8 10*3/uL — ABNORMAL HIGH (ref 1.5–6.5)
NEUT%: 69.8 % (ref 39.0–75.0)
Platelets: 335 10*3/uL (ref 140–400)
RBC: 4.95 10*6/uL (ref 4.20–5.82)
RDW: 22 % — ABNORMAL HIGH (ref 11.0–14.6)
WBC: 14 10*3/uL — ABNORMAL HIGH (ref 4.0–10.3)
lymph#: 2.5 10*3/uL (ref 0.9–3.3)

## 2013-04-14 LAB — COMPREHENSIVE METABOLIC PANEL (CC13)
ALT: 23 U/L (ref 0–55)
AST: 21 U/L (ref 5–34)
Albumin: 3.6 g/dL (ref 3.5–5.0)
Alkaline Phosphatase: 102 U/L (ref 40–150)
BUN: 50.5 mg/dL — ABNORMAL HIGH (ref 7.0–26.0)
CO2: 27 mEq/L (ref 22–29)
Calcium: 10 mg/dL (ref 8.4–10.4)
Chloride: 104 mEq/L (ref 98–109)
Creatinine: 1 mg/dL (ref 0.7–1.3)
Glucose: 60 mg/dl — ABNORMAL LOW (ref 70–140)
Potassium: 3.9 mEq/L (ref 3.5–5.1)
Sodium: 143 mEq/L (ref 136–145)
Total Bilirubin: 0.4 mg/dL (ref 0.20–1.20)
Total Protein: 7.9 g/dL (ref 6.4–8.3)

## 2013-04-15 ENCOUNTER — Ambulatory Visit (HOSPITAL_COMMUNITY)
Admission: RE | Admit: 2013-04-15 | Discharge: 2013-04-15 | Disposition: A | Discharge status: Home / Self Care | Payer: BC Managed Care – PPO | Source: Ambulatory Visit | Attending: Oncology | Admitting: Oncology

## 2013-04-15 ENCOUNTER — Ambulatory Visit (HOSPITAL_BASED_OUTPATIENT_CLINIC_OR_DEPARTMENT_OTHER): Payer: BC Managed Care – PPO | Admitting: Oncology

## 2013-04-15 ENCOUNTER — Encounter (HOSPITAL_COMMUNITY): Payer: Self-pay

## 2013-04-15 VITALS — BP 126/98 | HR 60 | Temp 98.0°F | Resp 20 | Ht 72.0 in | Wt 216.9 lb

## 2013-04-15 DIAGNOSIS — M51379 Other intervertebral disc degeneration, lumbosacral region without mention of lumbar back pain or lower extremity pain: Secondary | ICD-10-CM | POA: Insufficient documentation

## 2013-04-15 DIAGNOSIS — K5900 Constipation, unspecified: Secondary | ICD-10-CM

## 2013-04-15 DIAGNOSIS — K769 Liver disease, unspecified: Secondary | ICD-10-CM | POA: Insufficient documentation

## 2013-04-15 DIAGNOSIS — D630 Anemia in neoplastic disease: Secondary | ICD-10-CM

## 2013-04-15 DIAGNOSIS — K4020 Bilateral inguinal hernia, without obstruction or gangrene, not specified as recurrent: Secondary | ICD-10-CM | POA: Insufficient documentation

## 2013-04-15 DIAGNOSIS — M5137 Other intervertebral disc degeneration, lumbosacral region: Secondary | ICD-10-CM | POA: Insufficient documentation

## 2013-04-15 DIAGNOSIS — C8299 Follicular lymphoma, unspecified, extranodal and solid organ sites: Secondary | ICD-10-CM | POA: Insufficient documentation

## 2013-04-15 DIAGNOSIS — M87059 Idiopathic aseptic necrosis of unspecified femur: Secondary | ICD-10-CM | POA: Insufficient documentation

## 2013-04-15 MED ORDER — MAGNESIUM HYDROXIDE 400 MG/5ML PO SUSP: 30.0000 mL | Freq: Every day | ORAL | Status: DC | PRN

## 2013-04-15 MED ORDER — IOHEXOL 300 MG/ML  SOLN
100.0000 mL | Freq: Once | INTRAMUSCULAR | Status: AC | PRN
  Administered 2013-04-15: 100 mL via INTRAVENOUS

## 2013-04-15 NOTE — Progress Notes (Signed)
Hematology and Oncology Follow Up Visit  John [name] [id number] May 11, 1951 62 y.o. 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   Prior Therapy: He is status post exploratory laparotomy and excision of an intra-abdominal mass and ileocolectomy in a Port-A-Cath placement on 02/09/2013  Current therapy:  Chemotherapy with CHOP-R cycle one to given on 04/16/2013.   Interim History:  John [name] presents today for a followup visit. He is a pleasant man and seen for followup after his recent operation and excision of an intra-abdominal mass. The pathology confirmed the presence of diffuse large cell lymphoma and he is currently recovering from his operation. Currently, his abdominal wound is healing well with a wound VAC has been removed. He still  reports some fatigue and tiredness but otherwise continue to recover without any major complications. His LE swelling is improved. He is clinically a lot better at this time. He is constipated in the last few days. No abdominal pain or fevers noted. He is ready to proceed with the first chemotherapy cycle.   Medications: I have reviewed the patient's current medications.  Current Outpatient Prescriptions  Medication Sig Dispense Refill  . amLODipine (NORVASC) 10 MG tablet Take 5 mg by mouth daily. Patient takes in the pm      . aspirin 81 MG tablet Take 81 mg by mouth daily.       . beta carotene w/minerals (OCUVITE) tablet Take 1 tablet by mouth daily.       . digoxin (LANOXIN) 0.25 MG tablet Take 0.25 mg by mouth every morning.       . docusate sodium (COLACE) 100 MG capsule Take 100 mg by mouth daily.      Marland Kitchen doxycycline (VIBRA-TABS) 100 MG tablet Take 1 tablet (100 mg total) by  mouth 2 (two) times daily.  28 tablet  2  . esomeprazole (NEXIUM) 40 MG capsule Take 40 mg by mouth at bedtime.       Marland Kitchen ezetimibe-simvastatin (VYTORIN) 10-40 MG per tablet Take 1 tablet by mouth at bedtime.      . furosemide (LASIX) 40 MG tablet Take 40 mg by mouth daily.       Marland Kitchen glipiZIDE (GLUCOTROL XL) 10 MG 24 hr tablet Take 10 mg by mouth daily.       Marland Kitchen HYDROcodone-acetaminophen (NORCO) 5-325 MG per tablet Take 1 tablet by mouth every 6 (six) hours as needed for pain.  30 tablet  0  . insulin glargine (LANTUS) 100 UNIT/ML injection Inject 85 Units into the skin at bedtime.       . iron polysaccharides (NIFEREX) 150 MG capsule Take 150 mg by mouth daily.      Marland Kitchen lidocaine-prilocaine (EMLA) cream Apply topically as needed.  30 g  0  . lidocaine-prilocaine (EMLA) cream Apply topically as needed. Apply approximately 1/2 teaspoon to skin over port, prior to chemotherapy treatment. Do NOT rub cream in. Cover with plastic saran wrap or press and seal. No gauze.  30 g  1  . magnesium hydroxide (MILK OF MAGNESIA) 400 MG/5ML suspension Take 30 mLs by mouth daily as needed for constipation.  360 mL  0  . metFORMIN (GLUCOPHAGE) 1000 MG tablet Take 1,000 mg by mouth 2 (two) times daily.       Marland Kitchen  metoprolol (TOPROL-XL) 200 MG 24 hr tablet Take 200 mg by mouth daily after breakfast.       . Misc Natural Products (OSTEO BI-FLEX ADV TRIPLE ST) TABS Take 500 each by mouth 2 (two) times daily.       . nitroGLYCERIN (NITROSTAT) 0.4 MG SL tablet Place 0.4 mg under the tongue every 5 (five) minutes as needed for chest pain.      . Omega-3 Fatty Acids (FISH OIL) 1000 MG CAPS Take 1 capsule by mouth 2 (two) times daily.       . ondansetron (ZOFRAN) 8 MG tablet Take 1 tablet (8 mg total) by mouth every 8 (eight) hours as needed for nausea.  20 tablet  0  . oxyCODONE-acetaminophen (ROXICET) 5-325 MG per tablet Take 2 tablets by mouth every 4 (four) hours as needed for pain.  60 tablet  0  . potassium chloride SA  (K-DUR,KLOR-CON) 20 MEQ tablet Take 10 mEq by mouth daily.       . predniSONE (DELTASONE) 50 MG tablet Take 2 tablets for 5 days with each chemotherapy cycle.  60 tablet  1  . ramipril (ALTACE) 5 MG capsule Take 5 mg by mouth 2 (two) times daily.       . sildenafil (VIAGRA) 100 MG tablet Take 100 mg by mouth as needed.      . sitaGLIPtin (JANUVIA) 100 MG tablet Take 100 mg by mouth daily.       Marland Kitchen warfarin (COUMADIN) 5 MG tablet Take 5-7.5 mg by mouth daily. Wed take 7.5 mg, and 5 mg on all other days      . zolpidem (AMBIEN) 10 MG tablet Take 1 tablet (10 mg total) by mouth at bedtime as needed for sleep.  30 tablet  0   No current facility-administered medications for this visit.     Allergies: No Known Allergies  Past Medical History, Surgical history, Social history, and Family History were reviewed and updated.  Review of Systems: Constitutional:  Negative for fever, chills, night sweats, anorexia, weight loss, pain. Cardiovascular: no chest pain or dyspnea on exertion Respiratory: negative Neurological: negative Dermatological: negative ENT: negative Skin: Negative. Gastrointestinal: negative Genito-Urinary: negative Hematological and Lymphatic: negative Breast: negative Musculoskeletal: negative Remaining ROS negative. Physical Exam: Blood pressure 126/98, pulse 60, temperature 98 F (36.7 C), temperature source Oral, resp. rate 20, height 6' (1.829 m), weight 216 lb 14.4 oz (98.385 kg). ECOG: 1 General appearance: alert Head: Normocephalic, without obvious abnormality, atraumatic Neck: no adenopathy, no carotid bruit, no JVD, supple, symmetrical, trachea midline and thyroid not enlarged, symmetric, no tenderness/mass/nodules Lymph nodes: Cervical, supraclavicular, and axillary nodes normal. Heart:regular rate and rhythm, S1, S2 normal, no murmur, click, rub or gallop Lung:chest clear, no wheezing, rales, normal symmetric air entry Abdomin: soft, non-tender, without  masses or organomegaly EXT:no erythema, induration, or nodules Abdominal wound appear to be healing very little draining noted.   Lab Results: Lab Results  Component Value Date   WBC 14.0* 04/14/2013   HGB 11.9* 04/14/2013   HCT 36.9* 04/14/2013   MCV 74.5* 04/14/2013   PLT 335 04/14/2013     Chemistry      Component Value Date/Time   NA 143 04/14/2013 0859   NA 142 02/19/2013 0529   K 3.9 04/14/2013 0859   K 3.2* 02/19/2013 0529   CL 107 02/19/2013 0529   CL 106 01/21/2013 1050   CO2 27 04/14/2013 0859   CO2 23 02/19/2013 0529   BUN 50.5* 04/14/2013 [id number]  BUN 12 02/19/2013 0529   CREATININE 1.0 04/14/2013 0859   CREATININE 0.77 02/19/2013 0529      Component Value Date/Time   CALCIUM 10.0 04/14/2013 0859   CALCIUM 8.0* 02/19/2013 0529   ALKPHOS 102 04/14/2013 0859   AST 21 04/14/2013 0859   ALT 23 04/14/2013 0859   BILITOT 0.40 04/14/2013 0859      CT CHEST, ABDOMEN AND PELVIS WITH CONTRAST  Technique: Contiguous axial images of the chest abdomen and pelvis  were obtained after IV contrast administration.  Contrast: 100 ml Omnipaque-300  Comparison: PET 01/26/2013. Abdominal pelvic CT of 01/05/2013.  Chest CT [facility]/07/2010 from [facility].  CT CHEST  Findings: Lung windows demonstrate similar minimal subpleural  nodularity at the right lower lobe on image 43/series 4. No  worrisome lung nodule or mass.  Soft tissue windows demonstrate no supraclavicular adenopathy.  Prior median sternotomy. Bovine arch. Mild cardiomegaly. Prior  mitral valve repair. No pericardial or pleural effusion. No  mediastinal or hilar adenopathy. Hypoenhancement within the left  atrial appendage on image 28/series 2.  IMPRESSION:  1. No acute process or evidence of active lymphoma within the  chest.  2. Cannot exclude left atrial appendage thrombus. Dr. Alen Blew  aware as of 3:45 p.m.  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  Normal spleen, stomach,  pancreas, gallbladder, biliary tract, adrenal glands. Suspect a  punctate left renal collecting system calculus on image 79/series  2. Normal right kidney. Decrease size of a preaortic lymph node.  This measures 2 mm on image 84/series 2 versus 8 mm on prior exam.  This area was artifact degraded on the prior PET, but  hypermetabolism within this area cannot be excluded.  No retrocrural adenopathy. Colonic stool burden suggests  constipation.  Normal terminal ileum. Interval resection of the previously  described cavitary small bowel mass. No residual mesenteric  adenopathy. No ascites.  Bilateral small fat containing inguinal hernias. No pelvic  sidewall adenopathy. Normal urinary bladder and prostate. No  significant free fluid.  There is avascular necrosis within the bilateral femoral heads.  Degenerative disc disease at the lumbosacral junction. Minimal  convex left lumbar spine curvature.  IMPRESSION:  1. Resection of small bowel lymphoma. No evidence of residual  mesenteric adenopathy.  2. Decreased size of a small retroperitoneal node. Given absence  of interval chemotherapy, this was likely reactive.  3. Bilateral femoral head avascular necrosis.   Impression and Plan: 62 year old gentleman with the following issues:  1. 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 He is status post excisional biopsy and ileocolectomy which confirmed the presence of this lymphoma. He'll proceed with systemic chemotherapy with CHOP with rituximab for his first cycle on 04/16/2013.  CT scan results from 8/6 discussed today and showed no residual disease. I plan to treat him for 4-6 cycles.   2. Anemia: Probably multifactorial in nature related to his recent operation and lymphoma. Much improved.   3. Abdominal wound: Appears to be healing nicely but not quite ready to restart chemotherapy. Wound VAC has been  removed.   4. IV access: A Port-A-Cath have been placed already and have been accessed 3 weeks ago and should be ready to proceed in any case. I've given a prescription for an EMLA cream as well.  5. Nausea prophylaxis prescription for Zofran was given as well.   6. Neutropenia prophylaxis: He will  receive Neulasta after each cycle of chemotherapy.  7. Followup: In 3 weeks for cycle 2 of chemotherapy.   Zola Button, MD 8/6/20143:57 PM

## 2013-04-16 ENCOUNTER — Ambulatory Visit (HOSPITAL_BASED_OUTPATIENT_CLINIC_OR_DEPARTMENT_OTHER): Payer: BC Managed Care – PPO

## 2013-04-16 VITALS — BP 110/58 | HR 72 | Temp 97.6°F | Resp 20

## 2013-04-16 DIAGNOSIS — Z5111 Encounter for antineoplastic chemotherapy: Secondary | ICD-10-CM

## 2013-04-16 DIAGNOSIS — C8299 Follicular lymphoma, unspecified, extranodal and solid organ sites: Secondary | ICD-10-CM

## 2013-04-16 DIAGNOSIS — Z5112 Encounter for antineoplastic immunotherapy: Secondary | ICD-10-CM

## 2013-04-16 MED ORDER — SODIUM CHLORIDE 0.9 % IV SOLN
750.0000 mg/m2 | Freq: Once | INTRAVENOUS | Status: AC
  Administered 2013-04-16: 1720 mg via INTRAVENOUS
  Filled 2013-04-16: qty 86

## 2013-04-16 MED ORDER — SODIUM CHLORIDE 0.9 % IV SOLN
Freq: Once | INTRAVENOUS | Status: AC
  Administered 2013-04-16: 10:00:00 via INTRAVENOUS

## 2013-04-16 MED ORDER — HEPARIN SOD (PORK) LOCK FLUSH 100 UNIT/ML IV SOLN
500.0000 [IU] | Freq: Once | INTRAVENOUS | Status: AC | PRN
  Administered 2013-04-16: 500 [IU]
  Filled 2013-04-16: qty 5

## 2013-04-16 MED ORDER — DEXAMETHASONE SODIUM PHOSPHATE 20 MG/5ML IJ SOLN
20.0000 mg | Freq: Once | INTRAMUSCULAR | Status: AC
  Administered 2013-04-16: 20 mg via INTRAVENOUS

## 2013-04-16 MED ORDER — ONDANSETRON 16 MG/50ML IVPB (CHCC)
16.0000 mg | Freq: Once | INTRAVENOUS | Status: AC
  Administered 2013-04-16: 16 mg via INTRAVENOUS

## 2013-04-16 MED ORDER — DIPHENHYDRAMINE HCL 25 MG PO CAPS
50.0000 mg | ORAL_CAPSULE | Freq: Once | ORAL | Status: AC
  Administered 2013-04-16: 50 mg via ORAL

## 2013-04-16 MED ORDER — VINCRISTINE SULFATE CHEMO INJECTION 1 MG/ML
2.0000 mg | Freq: Once | INTRAVENOUS | Status: AC
  Administered 2013-04-16: 2 mg via INTRAVENOUS
  Filled 2013-04-16: qty 2

## 2013-04-16 MED ORDER — DOXORUBICIN HCL CHEMO IV INJECTION 2 MG/ML
50.0000 mg/m2 | Freq: Once | INTRAVENOUS | Status: AC
  Administered 2013-04-16: 114 mg via INTRAVENOUS
  Filled 2013-04-16: qty 57

## 2013-04-16 MED ORDER — ACETAMINOPHEN 325 MG PO TABS
650.0000 mg | ORAL_TABLET | Freq: Once | ORAL | Status: AC
  Administered 2013-04-16: 650 mg via ORAL

## 2013-04-16 MED ORDER — SODIUM CHLORIDE 0.9 % IV SOLN
375.0000 mg/m2 | Freq: Once | INTRAVENOUS | Status: AC
  Administered 2013-04-16: 900 mg via INTRAVENOUS
  Filled 2013-04-16: qty 90

## 2013-04-16 MED ORDER — SODIUM CHLORIDE 0.9 % IJ SOLN
10.0000 mL | INTRAMUSCULAR | Status: DC | PRN
  Administered 2013-04-16: 10 mL
  Filled 2013-04-16: qty 10

## 2013-04-16 NOTE — Patient Instructions (Addendum)
[facility] Discharge Instructions for Patients Receiving Chemotherapy  Today you received the following chemotherapy agents: Adriamycin, Vincristine, Cytoxan, Rituxan  To help prevent nausea and vomiting after your treatment, we encourage you to take your nausea medication as directed by your physician.    If you develop nausea and vomiting that is not controlled by your nausea medication, call the clinic.   BELOW ARE SYMPTOMS THAT SHOULD BE REPORTED IMMEDIATELY:  *FEVER GREATER THAN 100.5 F  *CHILLS WITH OR WITHOUT FEVER  NAUSEA AND VOMITING THAT IS NOT CONTROLLED WITH YOUR NAUSEA MEDICATION  *UNUSUAL SHORTNESS OF BREATH  *UNUSUAL BRUISING OR BLEEDING  TENDERNESS IN MOUTH AND THROAT WITH OR WITHOUT PRESENCE OF ULCERS  *URINARY PROBLEMS  *BOWEL PROBLEMS  UNUSUAL RASH Items with * indicate a potential emergency and should be followed up as soon as possible.  Feel free to call the clinic you have any questions or concerns. The clinic phone number is [phone].

## 2013-04-17 ENCOUNTER — Telehealth: Payer: Self-pay | Admitting: *Deleted

## 2013-04-17 ENCOUNTER — Ambulatory Visit (HOSPITAL_BASED_OUTPATIENT_CLINIC_OR_DEPARTMENT_OTHER): Payer: BC Managed Care – PPO

## 2013-04-17 ENCOUNTER — Other Ambulatory Visit: Payer: Self-pay | Admitting: *Deleted

## 2013-04-17 VITALS — BP 129/63 | HR 57 | Temp 98.4°F

## 2013-04-17 DIAGNOSIS — C8299 Follicular lymphoma, unspecified, extranodal and solid organ sites: Secondary | ICD-10-CM

## 2013-04-17 MED ORDER — PEGFILGRASTIM INJECTION 6 MG/0.6ML
6.0000 mg | Freq: Once | SUBCUTANEOUS | Status: AC
  Administered 2013-04-17: 6 mg via SUBCUTANEOUS
  Filled 2013-04-17: qty 0.6

## 2013-04-17 MED ORDER — ONDANSETRON HCL 8 MG PO TABS: 8.0000 mg | ORAL_TABLET | Freq: Three times a day (TID) | ORAL | Status: DC | PRN

## 2013-04-17 NOTE — Patient Instructions (Addendum)

## 2013-04-17 NOTE — Telephone Encounter (Signed)
Patient here for Neulasta injection following 1st r-chop chemotherapy.  States that he is doing well.  Only slight nausea which is relieved with antiemetics.  Eating and drinking good.  Knows to call  If he has any problems or concerns.

## 2013-04-24 ENCOUNTER — Ambulatory Visit (INDEPENDENT_AMBULATORY_CARE_PROVIDER_SITE_OTHER): Payer: BC Managed Care – PPO | Admitting: Surgery

## 2013-04-24 ENCOUNTER — Encounter (INDEPENDENT_AMBULATORY_CARE_PROVIDER_SITE_OTHER): Payer: Self-pay | Admitting: Surgery

## 2013-04-24 VITALS — BP 140/78 | HR 72 | Temp 96.6°F | Resp 14 | Ht 72.0 in | Wt 221.8 lb

## 2013-04-24 DIAGNOSIS — Z09 Encounter for follow-up examination after completed treatment for conditions other than malignant neoplasm: Secondary | ICD-10-CM

## 2013-04-24 NOTE — Progress Notes (Signed)
Subjective:     Patient ID: John [name], male   DOB: 24-Mar-1951, 62 y.o.   MRN: [id number]  HPI He is here for another postop visit. He has had his first round of chemotherapy and has had no problems with his wound. He is actually moving his bowels well  Review of Systems     Objective:   Physical Exam On exam, his incision is well-healed. There was a little granulation tissue which I treated with silver nitrate    Assessment:     Patient stable postop     Plan:     I am going to go ahead and give him a prescription for Keflex and told him to start taking [place] if he developed erythema of his incision during chemotherapy. If this develops, he will call and come back and see me as soon as possible. If not, I will see him back in one month

## 2013-05-07 ENCOUNTER — Encounter: Payer: Self-pay | Admitting: Oncology

## 2013-05-07 ENCOUNTER — Ambulatory Visit (HOSPITAL_BASED_OUTPATIENT_CLINIC_OR_DEPARTMENT_OTHER): Payer: BC Managed Care – PPO

## 2013-05-07 ENCOUNTER — Other Ambulatory Visit (HOSPITAL_BASED_OUTPATIENT_CLINIC_OR_DEPARTMENT_OTHER): Payer: BC Managed Care – PPO | Admitting: Lab

## 2013-05-07 ENCOUNTER — Ambulatory Visit (HOSPITAL_BASED_OUTPATIENT_CLINIC_OR_DEPARTMENT_OTHER): Payer: BC Managed Care – PPO | Admitting: Oncology

## 2013-05-07 VITALS — BP 146/65 | HR 60 | Temp 97.0°F | Resp 18 | Ht 72.0 in | Wt 227.0 lb

## 2013-05-07 VITALS — BP 119/50 | HR 55 | Temp 97.9°F | Resp 20

## 2013-05-07 DIAGNOSIS — Z5111 Encounter for antineoplastic chemotherapy: Secondary | ICD-10-CM

## 2013-05-07 DIAGNOSIS — Z5112 Encounter for antineoplastic immunotherapy: Secondary | ICD-10-CM

## 2013-05-07 DIAGNOSIS — C8299 Follicular lymphoma, unspecified, extranodal and solid organ sites: Secondary | ICD-10-CM

## 2013-05-07 LAB — COMPREHENSIVE METABOLIC PANEL (CC13)
ALT: 16 U/L (ref 0–55)
AST: 19 U/L (ref 5–34)
Albumin: 3.5 g/dL (ref 3.5–5.0)
Alkaline Phosphatase: 118 U/L (ref 40–150)
BUN: 28.9 mg/dL — ABNORMAL HIGH (ref 7.0–26.0)
CO2: 24 mEq/L (ref 22–29)
Calcium: 9.2 mg/dL (ref 8.4–10.4)
Chloride: 104 mEq/L (ref 98–109)
Creatinine: 1.1 mg/dL (ref 0.7–1.3)
Glucose: 292 mg/dl — ABNORMAL HIGH (ref 70–140)
Potassium: 5.9 mEq/L — ABNORMAL HIGH (ref 3.5–5.1)
Sodium: 136 mEq/L (ref 136–145)
Total Bilirubin: 0.32 mg/dL (ref 0.20–1.20)
Total Protein: 7.2 g/dL (ref 6.4–8.3)

## 2013-05-07 LAB — CBC WITH DIFFERENTIAL/PLATELET
BASO%: 1.3 % (ref 0.0–2.0)
Basophils Absolute: 0.2 10*3/uL — ABNORMAL HIGH (ref 0.0–0.1)
EOS%: 0.4 % (ref 0.0–7.0)
Eosinophils Absolute: 0.1 10*3/uL (ref 0.0–0.5)
HCT: 33.1 % — ABNORMAL LOW (ref 38.4–49.9)
HGB: 10.6 g/dL — ABNORMAL LOW (ref 13.0–17.1)
LYMPH%: 4.9 % — ABNORMAL LOW (ref 14.0–49.0)
MCH: 25.4 pg — ABNORMAL LOW (ref 27.2–33.4)
MCHC: 32 g/dL (ref 32.0–36.0)
MCV: 79.2 fL — ABNORMAL LOW (ref 79.3–98.0)
MONO#: 1.2 10*3/uL — ABNORMAL HIGH (ref 0.1–0.9)
MONO%: 8.1 % (ref 0.0–14.0)
NEUT#: 12.9 10*3/uL — ABNORMAL HIGH (ref 1.5–6.5)
NEUT%: 85.3 % — ABNORMAL HIGH (ref 39.0–75.0)
Platelets: 445 10*3/uL — ABNORMAL HIGH (ref 140–400)
RBC: 4.18 10*6/uL — ABNORMAL LOW (ref 4.20–5.82)
RDW: 20.7 % — ABNORMAL HIGH (ref 11.0–14.6)
WBC: 15.1 10*3/uL — ABNORMAL HIGH (ref 4.0–10.3)
lymph#: 0.7 10*3/uL — ABNORMAL LOW (ref 0.9–3.3)

## 2013-05-07 MED ORDER — VINCRISTINE SULFATE CHEMO INJECTION 1 MG/ML
2.0000 mg | Freq: Once | INTRAVENOUS | Status: AC
  Administered 2013-05-07: 2 mg via INTRAVENOUS
  Filled 2013-05-07: qty 2

## 2013-05-07 MED ORDER — ONDANSETRON 16 MG/50ML IVPB (CHCC)
16.0000 mg | Freq: Once | INTRAVENOUS | Status: AC
  Administered 2013-05-07: 16 mg via INTRAVENOUS

## 2013-05-07 MED ORDER — HEPARIN SOD (PORK) LOCK FLUSH 100 UNIT/ML IV SOLN
500.0000 [IU] | Freq: Once | INTRAVENOUS | Status: AC | PRN
  Administered 2013-05-07: 500 [IU]
  Filled 2013-05-07: qty 5

## 2013-05-07 MED ORDER — ONDANSETRON HCL 8 MG PO TABS: 8.0000 mg | ORAL_TABLET | Freq: Three times a day (TID) | ORAL | Status: DC | PRN

## 2013-05-07 MED ORDER — DOXORUBICIN HCL CHEMO IV INJECTION 2 MG/ML
50.0000 mg/m2 | Freq: Once | INTRAVENOUS | Status: AC
  Administered 2013-05-07: 114 mg via INTRAVENOUS
  Filled 2013-05-07: qty 57

## 2013-05-07 MED ORDER — SODIUM CHLORIDE 0.9 % IV SOLN
Freq: Once | INTRAVENOUS | Status: AC
  Administered 2013-05-07: 10:00:00 via INTRAVENOUS

## 2013-05-07 MED ORDER — SODIUM CHLORIDE 0.9 % IV SOLN
375.0000 mg/m2 | Freq: Once | INTRAVENOUS | Status: AC
  Administered 2013-05-07: 900 mg via INTRAVENOUS
  Filled 2013-05-07: qty 90

## 2013-05-07 MED ORDER — SODIUM CHLORIDE 0.9 % IJ SOLN
10.0000 mL | INTRAMUSCULAR | Status: DC | PRN
  Administered 2013-05-07: 10 mL
  Filled 2013-05-07: qty 10

## 2013-05-07 MED ORDER — CYCLOPHOSPHAMIDE CHEMO INJECTION 1 GM
750.0000 mg/m2 | Freq: Once | INTRAMUSCULAR | Status: AC
  Administered 2013-05-07: 1720 mg via INTRAVENOUS
  Filled 2013-05-07: qty 86

## 2013-05-07 MED ORDER — DEXAMETHASONE SODIUM PHOSPHATE 20 MG/5ML IJ SOLN
20.0000 mg | Freq: Once | INTRAMUSCULAR | Status: AC
  Administered 2013-05-07: 20 mg via INTRAVENOUS

## 2013-05-07 MED ORDER — ACETAMINOPHEN 325 MG PO TABS
650.0000 mg | ORAL_TABLET | Freq: Once | ORAL | Status: AC
  Administered 2013-05-07: 650 mg via ORAL

## 2013-05-07 MED ORDER — DIPHENHYDRAMINE HCL 25 MG PO CAPS
50.0000 mg | ORAL_CAPSULE | Freq: Once | ORAL | Status: AC
  Administered 2013-05-07: 50 mg via ORAL

## 2013-05-07 NOTE — Progress Notes (Signed)
Hematology and Oncology Follow Up Visit  John [name] [id number] 10/28/1950 62 y.o. 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   Prior Therapy: He is status post exploratory laparotomy and excision of an intra-abdominal mass and ileocolectomy in a Port-A-Cath placement on 02/09/2013  Current therapy:  Chemotherapy with CHOP-R cycle one to given on 04/16/2013. He is here for cycle 2 today.  Interim History:  John [name] presents today for a followup visit. He is a pleasant man and seen for followup prior to chemo today. Currently, his abdominal wound is healing well with a wound VAC has been removed. He was placed on Keflex per his surgeon recently. Denies fevers and drainage from his wound. He is dressing this daily. He still  reports some fatigue and tiredness but otherwise continue to recover without any major complications. His LE swelling is improved. He is clinically a lot better at this time. No abdominal pain, nausea, or vomiting. Reports that his right ear feels clogged. No pain or drainage noted.   Medications: I have reviewed the patient's current medications.  Current Outpatient Prescriptions  Medication Sig Dispense Refill  . amLODipine (NORVASC) 10 MG tablet Take 5 mg by mouth daily. Patient takes in the pm      . aspirin 81 MG tablet Take 81 mg by mouth daily.       . beta carotene w/minerals (OCUVITE) tablet Take 1 tablet by mouth daily.       . digoxin (LANOXIN) 0.25 MG tablet Take 0.25 mg by mouth every morning.       . docusate sodium (COLACE) 100 MG capsule Take 100 mg by mouth daily.      Marland Kitchen doxycycline (VIBRA-TABS) 100 MG tablet Take 1 tablet (100 mg total) by mouth 2 (two) times daily.  28 tablet  2   . esomeprazole (NEXIUM) 40 MG capsule Take 40 mg by mouth at bedtime.       Marland Kitchen ezetimibe-simvastatin (VYTORIN) 10-40 MG per tablet Take 1 tablet by mouth at bedtime.      . furosemide (LASIX) 40 MG tablet Take 40 mg by mouth daily.       Marland Kitchen glipiZIDE (GLUCOTROL XL) 10 MG 24 hr tablet Take 10 mg by mouth daily.       Marland Kitchen HYDROcodone-acetaminophen (NORCO) 5-325 MG per tablet Take 1 tablet by mouth every 6 (six) hours as needed for pain.  30 tablet  0  . insulin glargine (LANTUS) 100 UNIT/ML injection Inject 85 Units into the skin at bedtime.       . iron polysaccharides (NIFEREX) 150 MG capsule Take 150 mg by mouth daily.      Marland Kitchen lidocaine-prilocaine (EMLA) cream Apply topically as needed.  30 g  0  . lidocaine-prilocaine (EMLA) cream Apply topically as needed. Apply approximately 1/2 teaspoon to skin over port, prior to chemotherapy treatment. Do NOT rub cream in. Cover with plastic saran wrap or press and seal. No gauze.  30 g  1  . magnesium hydroxide (MILK OF MAGNESIA) 400 MG/5ML suspension Take 30 mLs by mouth daily as needed for constipation.  360 mL  0  . metFORMIN (GLUCOPHAGE) 1000 MG tablet Take 1,000 mg by mouth 2 (two) times daily.       . metoprolol (TOPROL-XL)  200 MG 24 hr tablet Take 200 mg by mouth daily after breakfast.       . Misc Natural Products (OSTEO BI-FLEX ADV TRIPLE ST) TABS Take 500 each by mouth 2 (two) times daily.       . nitroGLYCERIN (NITROSTAT) 0.4 MG SL tablet Place 0.4 mg under the tongue every 5 (five) minutes as needed for chest pain.      . Omega-3 Fatty Acids (FISH OIL) 1000 MG CAPS Take 1 capsule by mouth 2 (two) times daily.       . ondansetron (ZOFRAN) 8 MG tablet Take 1 tablet (8 mg total) by mouth every 8 (eight) hours as needed for nausea.  20 tablet  2  . oxyCODONE-acetaminophen (ROXICET) 5-325 MG per tablet Take 2 tablets by mouth every 4 (four) hours as needed for pain.  60 tablet  0  . potassium chloride SA (K-DUR,KLOR-CON) 20 MEQ tablet Take 10 mEq by  mouth daily.       . predniSONE (DELTASONE) 50 MG tablet Take 2 tablets for 5 days with each chemotherapy cycle.  60 tablet  1  . ramipril (ALTACE) 5 MG capsule Take 5 mg by mouth 2 (two) times daily.       . sildenafil (VIAGRA) 100 MG tablet Take 100 mg by mouth as needed.      . sitaGLIPtin (JANUVIA) 100 MG tablet Take 100 mg by mouth daily.       Marland Kitchen warfarin (COUMADIN) 5 MG tablet Take 5-7.5 mg by mouth daily. Wed take 7.5 mg, and 5 mg on all other days      . zolpidem (AMBIEN) 10 MG tablet Take 1 tablet (10 mg total) by mouth at bedtime as needed for sleep.  30 tablet  0   No current facility-administered medications for this visit.     Allergies: No Known Allergies  Past Medical History, Surgical history, Social history, and Family History were reviewed and updated.  Review of Systems: Constitutional:  Negative for fever, chills, night sweats, anorexia, weight loss, pain. Cardiovascular: no chest pain or dyspnea on exertion Respiratory: negative Neurological: negative Dermatological: negative ENT: negative Skin: Negative. Gastrointestinal: negative Genito-Urinary: negative Hematological and Lymphatic: negative Breast: negative Musculoskeletal: negative Remaining ROS negative.  Physical Exam: Blood pressure 146/65, pulse 60, temperature 97 F (36.1 C), temperature source Oral, resp. rate 18, height 6' (1.829 m), weight 227 lb (102.967 kg). ECOG: 1 General appearance: alert Head: Normocephalic, without obvious abnormality, atraumatic Neck: no adenopathy, no carotid bruit, no JVD, supple, symmetrical, trachea midline and thyroid not enlarged, symmetric, no tenderness/mass/nodules Lymph nodes: Cervical, supraclavicular, and axillary nodes normal. Heart:regular rate and rhythm, S1, S2 normal, no murmur, click, rub or gallop Lung:chest clear, no wheezing, rales, normal symmetric air entry Abdomen: soft, non-tender, without masses or organomegaly EXT:no erythema, induration, or  nodules Abdominal wound appear to be healing very little draining noted.  Ears: Right ear with cerumen noted. No drainage or redness noted. I was unable to visualize the tympanic membrane due to cerumen. Left ear without redness of drainage.  Lab Results: Lab Results  Component Value Date   WBC 15.1* 05/07/2013   HGB 10.6* 05/07/2013   HCT 33.1* 05/07/2013   MCV 79.2* 05/07/2013   PLT 445* 05/07/2013     Chemistry      Component Value Date/Time   NA 143 04/14/2013 0859   NA 142 02/19/2013 0529   K 3.9 04/14/2013 0859   K 3.2* 02/19/2013 0529   CL 107 02/19/2013 0529  CL 106 01/21/2013 1050   CO2 27 04/14/2013 0859   CO2 23 02/19/2013 0529   BUN 50.5* 04/14/2013 0859   BUN 12 02/19/2013 0529   CREATININE 1.0 04/14/2013 0859   CREATININE 0.77 02/19/2013 0529      Component Value Date/Time   CALCIUM 10.0 04/14/2013 0859   CALCIUM 8.0* 02/19/2013 0529   ALKPHOS 102 04/14/2013 0859   AST 21 04/14/2013 0859   ALT 23 04/14/2013 0859   BILITOT 0.40 04/14/2013 0859      Impression and Plan: 62 year old gentleman with the following issues:  1. 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 He is status post excisional biopsy and ileocolectomy which confirmed the presence of this lymphoma. CT scan results from 8/6 showed no residual disease. I plan to treat him for 4-6 cycles. Recommend that he proceed with cycle 2 of chemo today without dose modification.  2. Anemia: Probably multifactorial in nature related to his recent operation and lymphoma. Stable. He has no active bleeding. No transfusion is indicated.   3. Abdominal wound: Appears to be healing nicely. Wound VAC has been removed. He is seeing surgery for this.  4. IV access: PAC in place.  5. Nausea prophylaxis: I have refilled his Zofran.   6. Neutropenia prophylaxis: He will receive Neulasta after each cycle of chemotherapy.  7. Cerumen impaction: Recommend that he try Debrox OTC. If not  improved, he can see PCP to see if right ear can be irrigated.   8. Followup: In 3 weeks for cycle 3 of chemotherapy.   [place], [place] 8/28/20149:46 AM

## 2013-05-07 NOTE — Patient Instructions (Addendum)
[facility] Discharge Instructions for Patients Receiving Chemotherapy  Today you received the following chemotherapy agents :  Adriamycin, Vincristine, Cytoxan, Rituxan.  To help prevent nausea and vomiting after your treatment, we encourage you to take your nausea medication as instructed by your physician.  Take Zofran 8 mg every 8 hrs as needed for nausea.  DO NOT Eat greasy nor spicy foods.  DO Drink lots of fluids as tolerated.   If you develop nausea and vomiting that is not controlled by your nausea medication, call the clinic.   BELOW ARE SYMPTOMS THAT SHOULD BE REPORTED IMMEDIATELY:  *FEVER GREATER THAN 100.5 F  *CHILLS WITH OR WITHOUT FEVER  NAUSEA AND VOMITING THAT IS NOT CONTROLLED WITH YOUR NAUSEA MEDICATION  *UNUSUAL SHORTNESS OF BREATH  *UNUSUAL BRUISING OR BLEEDING  TENDERNESS IN MOUTH AND THROAT WITH OR WITHOUT PRESENCE OF ULCERS  *URINARY PROBLEMS  *BOWEL PROBLEMS  UNUSUAL RASH Items with * indicate a potential emergency and should be followed up as soon as possible.  Feel free to call the clinic you have any questions or concerns. The clinic phone number is [phone].

## 2013-05-08 ENCOUNTER — Ambulatory Visit (HOSPITAL_BASED_OUTPATIENT_CLINIC_OR_DEPARTMENT_OTHER): Payer: BC Managed Care – PPO

## 2013-05-08 VITALS — BP 145/42 | HR 70 | Temp 97.4°F

## 2013-05-08 DIAGNOSIS — C8299 Follicular lymphoma, unspecified, extranodal and solid organ sites: Secondary | ICD-10-CM

## 2013-05-08 DIAGNOSIS — Z5189 Encounter for other specified aftercare: Secondary | ICD-10-CM

## 2013-05-08 MED ORDER — PEGFILGRASTIM INJECTION 6 MG/0.6ML
6.0000 mg | Freq: Once | SUBCUTANEOUS | Status: AC
  Administered 2013-05-08: 6 mg via SUBCUTANEOUS
  Filled 2013-05-08: qty 0.6

## 2013-05-15 ENCOUNTER — Encounter (INDEPENDENT_AMBULATORY_CARE_PROVIDER_SITE_OTHER): Payer: Self-pay | Admitting: Surgery

## 2013-05-15 ENCOUNTER — Ambulatory Visit (INDEPENDENT_AMBULATORY_CARE_PROVIDER_SITE_OTHER): Payer: BC Managed Care – PPO | Admitting: Surgery

## 2013-05-15 VITALS — BP 122/74 | HR 63 | Temp 97.7°F | Resp 12 | Ht 72.0 in | Wt 227.2 lb

## 2013-05-15 DIAGNOSIS — Z09 Encounter for follow-up examination after completed treatment for conditions other than malignant neoplasm: Secondary | ICD-10-CM

## 2013-05-15 NOTE — Progress Notes (Signed)
Subjective:     Patient ID: John [name], male   DOB: 09/08/1951, 62 y.o.   MRN: [id number]  HPI He has just finished a second round of chemotherapy. He has no complaints  Review of Systems     Objective:   Physical Exam His wound is now completely healed    Assessment:     Patient stable postop     Plan:     I will see them back when it is time to remove his Port-A-Cath

## 2013-05-28 ENCOUNTER — Ambulatory Visit (HOSPITAL_BASED_OUTPATIENT_CLINIC_OR_DEPARTMENT_OTHER): Payer: BC Managed Care – PPO

## 2013-05-28 ENCOUNTER — Other Ambulatory Visit (HOSPITAL_BASED_OUTPATIENT_CLINIC_OR_DEPARTMENT_OTHER): Payer: BC Managed Care – PPO | Admitting: Lab

## 2013-05-28 ENCOUNTER — Ambulatory Visit (HOSPITAL_BASED_OUTPATIENT_CLINIC_OR_DEPARTMENT_OTHER): Payer: BC Managed Care – PPO | Admitting: Oncology

## 2013-05-28 ENCOUNTER — Telehealth: Payer: Self-pay | Admitting: *Deleted

## 2013-05-28 VITALS — BP 115/47 | HR 66 | Temp 97.1°F | Resp 20

## 2013-05-28 VITALS — BP 144/78 | HR 59 | Temp 97.9°F | Resp 20 | Ht 72.0 in | Wt 233.2 lb

## 2013-05-28 DIAGNOSIS — C8588 Other specified types of non-Hodgkin lymphoma, lymph nodes of multiple sites: Secondary | ICD-10-CM

## 2013-05-28 DIAGNOSIS — C8299 Follicular lymphoma, unspecified, extranodal and solid organ sites: Secondary | ICD-10-CM

## 2013-05-28 DIAGNOSIS — D649 Anemia, unspecified: Secondary | ICD-10-CM

## 2013-05-28 DIAGNOSIS — Z5112 Encounter for antineoplastic immunotherapy: Secondary | ICD-10-CM

## 2013-05-28 DIAGNOSIS — L259 Unspecified contact dermatitis, unspecified cause: Secondary | ICD-10-CM

## 2013-05-28 DIAGNOSIS — Z5111 Encounter for antineoplastic chemotherapy: Secondary | ICD-10-CM

## 2013-05-28 LAB — COMPREHENSIVE METABOLIC PANEL (CC13)
ALT: 17 U/L (ref 0–55)
AST: 18 U/L (ref 5–34)
Albumin: 3.4 g/dL — ABNORMAL LOW (ref 3.5–5.0)
Alkaline Phosphatase: 108 U/L (ref 40–150)
BUN: 31.8 mg/dL — ABNORMAL HIGH (ref 7.0–26.0)
CO2: 26 mEq/L (ref 22–29)
Calcium: 9 mg/dL (ref 8.4–10.4)
Chloride: 104 mEq/L (ref 98–109)
Creatinine: 1.2 mg/dL (ref 0.7–1.3)
Glucose: 236 mg/dl — ABNORMAL HIGH (ref 70–140)
Potassium: 6 mEq/L — ABNORMAL HIGH (ref 3.5–5.1)
Sodium: 138 mEq/L (ref 136–145)
Total Bilirubin: 0.3 mg/dL (ref 0.20–1.20)
Total Protein: 6.8 g/dL (ref 6.4–8.3)

## 2013-05-28 LAB — CBC WITH DIFFERENTIAL/PLATELET
BASO%: 0.8 % (ref 0.0–2.0)
Basophils Absolute: 0.1 10*3/uL (ref 0.0–0.1)
EOS%: 1.2 % (ref 0.0–7.0)
Eosinophils Absolute: 0.1 10*3/uL (ref 0.0–0.5)
HCT: 33.6 % — ABNORMAL LOW (ref 38.4–49.9)
HGB: 10.8 g/dL — ABNORMAL LOW (ref 13.0–17.1)
LYMPH%: 7.5 % — ABNORMAL LOW (ref 14.0–49.0)
MCH: 27.1 pg — ABNORMAL LOW (ref 27.2–33.4)
MCHC: 32.1 g/dL (ref 32.0–36.0)
MCV: 84.2 fL (ref 79.3–98.0)
MONO#: 1.2 10*3/uL — ABNORMAL HIGH (ref 0.1–0.9)
MONO%: 10.4 % (ref 0.0–14.0)
NEUT#: 9.1 10*3/uL — ABNORMAL HIGH (ref 1.5–6.5)
NEUT%: 80.1 % — ABNORMAL HIGH (ref 39.0–75.0)
Platelets: 239 10*3/uL (ref 140–400)
RBC: 3.99 10*6/uL — ABNORMAL LOW (ref 4.20–5.82)
RDW: 23.7 % — ABNORMAL HIGH (ref 11.0–14.6)
WBC: 11.3 10*3/uL — ABNORMAL HIGH (ref 4.0–10.3)
lymph#: 0.9 10*3/uL (ref 0.9–3.3)
nRBC: 0 % (ref 0–0)

## 2013-05-28 MED ORDER — DEXAMETHASONE SODIUM PHOSPHATE 20 MG/5ML IJ SOLN
INTRAMUSCULAR | Status: AC
  Filled 2013-05-28: qty 5

## 2013-05-28 MED ORDER — VINCRISTINE SULFATE CHEMO INJECTION 1 MG/ML
2.0000 mg | Freq: Once | INTRAVENOUS | Status: AC
  Administered 2013-05-28: 2 mg via INTRAVENOUS
  Filled 2013-05-28: qty 2

## 2013-05-28 MED ORDER — HEPARIN SOD (PORK) LOCK FLUSH 100 UNIT/ML IV SOLN
500.0000 [IU] | Freq: Once | INTRAVENOUS | Status: AC | PRN
  Administered 2013-05-28: 500 [IU]
  Filled 2013-05-28: qty 5

## 2013-05-28 MED ORDER — DOXORUBICIN HCL CHEMO IV INJECTION 2 MG/ML
50.0000 mg/m2 | Freq: Once | INTRAVENOUS | Status: AC
  Administered 2013-05-28: 114 mg via INTRAVENOUS
  Filled 2013-05-28: qty 57

## 2013-05-28 MED ORDER — SODIUM CHLORIDE 0.9 % IJ SOLN
10.0000 mL | INTRAMUSCULAR | Status: DC | PRN
  Administered 2013-05-28: 10 mL
  Filled 2013-05-28: qty 10

## 2013-05-28 MED ORDER — ONDANSETRON 16 MG/50ML IVPB (CHCC)
INTRAVENOUS | Status: AC
  Filled 2013-05-28: qty 16

## 2013-05-28 MED ORDER — SODIUM CHLORIDE 0.9 % IV SOLN
750.0000 mg/m2 | Freq: Once | INTRAVENOUS | Status: AC
  Administered 2013-05-28: 1720 mg via INTRAVENOUS
  Filled 2013-05-28: qty 86

## 2013-05-28 MED ORDER — ONDANSETRON 16 MG/50ML IVPB (CHCC)
16.0000 mg | Freq: Once | INTRAVENOUS | Status: AC
  Administered 2013-05-28: 16 mg via INTRAVENOUS

## 2013-05-28 MED ORDER — DEXAMETHASONE SODIUM PHOSPHATE 20 MG/5ML IJ SOLN
20.0000 mg | Freq: Once | INTRAMUSCULAR | Status: AC
  Administered 2013-05-28: 20 mg via INTRAVENOUS

## 2013-05-28 MED ORDER — SODIUM CHLORIDE 0.9 % IV SOLN
375.0000 mg/m2 | Freq: Once | INTRAVENOUS | Status: AC
  Administered 2013-05-28: 900 mg via INTRAVENOUS
  Filled 2013-05-28: qty 90

## 2013-05-28 MED ORDER — SODIUM CHLORIDE 0.9 % IV SOLN
Freq: Once | INTRAVENOUS | Status: AC
  Administered 2013-05-28: 09:00:00 via INTRAVENOUS

## 2013-05-28 NOTE — Patient Instructions (Addendum)
[facility] Discharge Instructions for Patients Receiving Chemotherapy  Today you received the following chemotherapy agents:  Adriamycin, Vincristine, Cytoxan and Rituxan  To help prevent nausea and vomiting after your treatment, we encourage you to take your nausea medication as ordered per MD.   If you develop nausea and vomiting that is not controlled by your nausea medication, call the clinic.   BELOW ARE SYMPTOMS THAT SHOULD BE REPORTED IMMEDIATELY:  *FEVER GREATER THAN 100.5 F  *CHILLS WITH OR WITHOUT FEVER  NAUSEA AND VOMITING THAT IS NOT CONTROLLED WITH YOUR NAUSEA MEDICATION  *UNUSUAL SHORTNESS OF BREATH  *UNUSUAL BRUISING OR BLEEDING  TENDERNESS IN MOUTH AND THROAT WITH OR WITHOUT PRESENCE OF ULCERS  *URINARY PROBLEMS  *BOWEL PROBLEMS  UNUSUAL RASH Items with * indicate a potential emergency and should be followed up as soon as possible.  Feel free to call the clinic you have any questions or concerns. The clinic phone number is [phone].    

## 2013-05-28 NOTE — Telephone Encounter (Signed)
Per staff message and POF I have scheduled appts.  JMW  

## 2013-05-28 NOTE — Progress Notes (Signed)
Hematology and Oncology Follow Up Visit  John [name] [id number] Apr 29, 1951 62 y.o. 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   Prior Therapy: He is status post exploratory laparotomy and excision of an intra-abdominal mass and ileocolectomy in a Port-A-Cath placement on 02/09/2013  Current therapy:  Chemotherapy with CHOP-R cycle one to given on 04/16/2013. He is here for cycle 3 today.  Interim History:  [name] presents today for a followup visit with his wife. He is a pleasant man and seen for followup prior to chemotherapy today. Currently, his abdominal wound has healed. He denies fevers and drainage from his wound. He is dressing this daily. He still  reports some fatigue and tiredness but otherwise continue to recover without any major complications. His LE swelling is improved. He is clinically a lot better at this time. No abdominal pain, nausea, or vomiting. He developed dermatitis on his skin but has improved with steroid cream. Chemotherapy continue to be well tolerated without any complications. He did have some pain after Neulasta injections more after the first cycle and the second. His pain is rather diffuse bony pain that lasted a few days but for the most part have been tolerable. He has no complications from the prednisone at this time but he noted that he has gained weight and his appetite is excellent.  Medications: I have reviewed the patient's current medications.  Current Outpatient Prescriptions  Medication Sig Dispense Refill  . amLODipine (NORVASC) 10 MG tablet Take 5 mg by mouth daily. Patient takes in the pm      . aspirin 81 MG tablet Take 81 mg by mouth daily.       . beta carotene  w/minerals (OCUVITE) tablet Take 1 tablet by mouth daily.       . Clobetasol Prop Emollient Base (CLOBETASOL PROPIONATE E) 0.05 % emollient cream Apply 1 application topically 2 (two) times daily as needed. To affected areas      . digoxin (LANOXIN) 0.25 MG tablet Take 0.25 mg by mouth every morning.       . docusate sodium (COLACE) 100 MG capsule Take 100 mg by mouth daily.      Marland Kitchen esomeprazole (NEXIUM) 40 MG capsule Take 40 mg by mouth at bedtime.       Marland Kitchen ezetimibe-simvastatin (VYTORIN) 10-40 MG per tablet Take 1 tablet by mouth at bedtime.      . furosemide (LASIX) 40 MG tablet Take 40 mg by mouth daily.       Marland Kitchen glipiZIDE (GLUCOTROL XL) 10 MG 24 hr tablet Take 10 mg by mouth daily.       Marland Kitchen HYDROcodone-acetaminophen (NORCO) 5-325 MG per tablet Take 1 tablet by mouth every 6 (six) hours as needed for pain.  30 tablet  0  . insulin glargine (LANTUS) 100 UNIT/ML injection Inject 85 Units into the skin at bedtime.       . iron polysaccharides (NIFEREX) 150 MG capsule Take 150 mg by mouth daily.      Marland Kitchen lidocaine-prilocaine (EMLA) cream Apply topically as needed.  30 g  0  . magnesium hydroxide (MILK OF MAGNESIA) 400 MG/5ML suspension Take 30 mLs by mouth daily as needed for constipation.  360 mL  0  . metFORMIN (GLUCOPHAGE) 1000 MG tablet Take  1,000 mg by mouth 2 (two) times daily.       . metoprolol (TOPROL-XL) 200 MG 24 hr tablet Take 200 mg by mouth daily after breakfast.       . Misc Natural Products (OSTEO BI-FLEX ADV TRIPLE ST) TABS Take 500 each by mouth 2 (two) times daily.       . Omega-3 Fatty Acids (FISH OIL) 1000 MG CAPS Take 1 capsule by mouth 2 (two) times daily.       . ondansetron (ZOFRAN) 8 MG tablet Take 1 tablet (8 mg total) by mouth every 8 (eight) hours as needed for nausea.  20 tablet  2  . potassium chloride SA (K-DUR,KLOR-CON) 20 MEQ tablet Take 10 mEq by mouth daily.       . predniSONE (DELTASONE) 50 MG tablet Take 2 tablets for 5 days with each chemotherapy cycle.  60 tablet   1  . ramipril (ALTACE) 5 MG capsule Take 5 mg by mouth 2 (two) times daily.       . sildenafil (VIAGRA) 100 MG tablet Take 100 mg by mouth as needed.      . sitaGLIPtin (JANUVIA) 100 MG tablet Take 100 mg by mouth daily.       Marland Kitchen warfarin (COUMADIN) 5 MG tablet Take 5-7.5 mg by mouth daily. Wed take 7.5 mg, and 5 mg on all other days      . nitroGLYCERIN (NITROSTAT) 0.4 MG SL tablet Place 0.4 mg under the tongue every 5 (five) minutes as needed for chest pain.       No current facility-administered medications for this visit.     Allergies: No Known Allergies  Past Medical History, Surgical history, Social history, and Family History were reviewed and updated.  Review of Systems:  Remaining ROS negative.  Physical Exam: Blood pressure 144/78, pulse 59, temperature 97.9 F (36.6 C), temperature source Oral, resp. rate 20, height 6' (1.829 m), weight 233 lb 3.2 oz (105.779 kg). ECOG: 1 General appearance: alert Head: Normocephalic, without obvious abnormality, atraumatic Neck: no adenopathy, no carotid bruit, no JVD, supple, symmetrical, trachea midline and thyroid not enlarged, symmetric, no tenderness/mass/nodules Lymph nodes: Cervical, supraclavicular, and axillary nodes normal. Heart:regular rate and rhythm, S1, S2 normal, no murmur, click, rub or gallop Lung:chest clear, no wheezing, rales, normal symmetric air entry Abdomen: soft, non-tender, without masses or organomegaly EXT:no erythema, induration, or nodules Abdominal wound appear to be healing very little draining noted.  Ears: Right ear with cerumen noted. No drainage or redness noted. I was unable to visualize the tympanic membrane due to cerumen. Left ear without redness of drainage.  Lab Results: Lab Results  Component Value Date   WBC 11.3* 05/28/2013   HGB 10.8* 05/28/2013   HCT 33.6* 05/28/2013   MCV 84.2 05/28/2013   PLT 239 05/28/2013     Chemistry      Component Value Date/Time   NA 136 05/07/2013 0811   NA  142 02/19/2013 0529   K 5.9* 05/07/2013 0811   K 3.2* 02/19/2013 0529   CL 107 02/19/2013 0529   CL 106 01/21/2013 1050   CO2 24 05/07/2013 0811   CO2 23 02/19/2013 0529   BUN 28.9* 05/07/2013 0811   BUN 12 02/19/2013 0529   CREATININE 1.1 05/07/2013 0811   CREATININE 0.77 02/19/2013 0529      Component Value Date/Time   CALCIUM 9.2 05/07/2013 0811   CALCIUM 8.0* 02/19/2013 0529   ALKPHOS 118 05/07/2013 0811   AST 19 05/07/2013 [id number]  ALT 16 05/07/2013 0811   BILITOT 0.32 05/07/2013 0811      Impression and Plan: 62 year old gentleman with the following issues:  1. 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 He is status post excisional biopsy and ileocolectomy which confirmed the presence of this lymphoma. CT scan results from 8/6 showed no residual disease. I plan to treat him for 4-6 cycles. Recommend that he proceed with cycle 3 of chemo today without dose modification. I will obtain a PET scan after the 4th cycle and will decide after that whether he should have 2 more cycles or not.  2. Anemia: Probably multifactorial in nature related to his recent operation and lymphoma. Stable. He has no active bleeding. No transfusion is indicated.   3. Abdominal wound: Appears to be healing nicely. Wound VAC has been removed.   4. IV access: PAC in place.  5. Nausea prophylaxis: On Zofran.   6. Neutropenia prophylaxis: He will receive Neulasta after each cycle of chemotherapy.  7. Dermatitis of his leg: Seems to have improved with steroid cream.  8. Followup: In 3 weeks for cycle 4 of chemotherapy.   [id number] 9/18/20148:43 AM

## 2013-05-29 ENCOUNTER — Ambulatory Visit (HOSPITAL_BASED_OUTPATIENT_CLINIC_OR_DEPARTMENT_OTHER): Payer: BC Managed Care – PPO

## 2013-05-29 VITALS — BP 142/46 | HR 65 | Temp 97.7°F

## 2013-05-29 DIAGNOSIS — C8299 Follicular lymphoma, unspecified, extranodal and solid organ sites: Secondary | ICD-10-CM

## 2013-05-29 DIAGNOSIS — Z5189 Encounter for other specified aftercare: Secondary | ICD-10-CM

## 2013-05-29 MED ORDER — PEGFILGRASTIM INJECTION 6 MG/0.6ML
6.0000 mg | Freq: Once | SUBCUTANEOUS | Status: AC
  Administered 2013-05-29: 6 mg via SUBCUTANEOUS
  Filled 2013-05-29: qty 0.6

## 2013-06-18 ENCOUNTER — Other Ambulatory Visit (HOSPITAL_BASED_OUTPATIENT_CLINIC_OR_DEPARTMENT_OTHER): Payer: BC Managed Care – PPO | Admitting: Lab

## 2013-06-18 ENCOUNTER — Ambulatory Visit (HOSPITAL_BASED_OUTPATIENT_CLINIC_OR_DEPARTMENT_OTHER): Payer: BC Managed Care – PPO

## 2013-06-18 ENCOUNTER — Ambulatory Visit (HOSPITAL_BASED_OUTPATIENT_CLINIC_OR_DEPARTMENT_OTHER): Payer: BC Managed Care – PPO | Admitting: Physician Assistant

## 2013-06-18 ENCOUNTER — Encounter: Payer: Self-pay | Admitting: Physician Assistant

## 2013-06-18 VITALS — BP 127/82 | HR 86 | Temp 97.8°F

## 2013-06-18 VITALS — BP 147/61 | HR 66 | Temp 97.8°F | Resp 20 | Ht 72.0 in | Wt 235.7 lb

## 2013-06-18 DIAGNOSIS — Z5111 Encounter for antineoplastic chemotherapy: Secondary | ICD-10-CM

## 2013-06-18 DIAGNOSIS — C8299 Follicular lymphoma, unspecified, extranodal and solid organ sites: Secondary | ICD-10-CM

## 2013-06-18 DIAGNOSIS — L259 Unspecified contact dermatitis, unspecified cause: Secondary | ICD-10-CM

## 2013-06-18 DIAGNOSIS — C8588 Other specified types of non-Hodgkin lymphoma, lymph nodes of multiple sites: Secondary | ICD-10-CM

## 2013-06-18 DIAGNOSIS — D649 Anemia, unspecified: Secondary | ICD-10-CM

## 2013-06-18 DIAGNOSIS — Z5112 Encounter for antineoplastic immunotherapy: Secondary | ICD-10-CM

## 2013-06-18 LAB — CBC WITH DIFFERENTIAL/PLATELET
BASO%: 0.6 % (ref 0.0–2.0)
Basophils Absolute: 0.1 10*3/uL (ref 0.0–0.1)
EOS%: 0.9 % (ref 0.0–7.0)
Eosinophils Absolute: 0.1 10*3/uL (ref 0.0–0.5)
HCT: 32.5 % — ABNORMAL LOW (ref 38.4–49.9)
HGB: 10.3 g/dL — ABNORMAL LOW (ref 13.0–17.1)
LYMPH%: 4.8 % — ABNORMAL LOW (ref 14.0–49.0)
MCH: 28.4 pg (ref 27.2–33.4)
MCHC: 31.7 g/dL — ABNORMAL LOW (ref 32.0–36.0)
MCV: 89.5 fL (ref 79.3–98.0)
MONO#: 0.8 10*3/uL (ref 0.1–0.9)
MONO%: 6.5 % (ref 0.0–14.0)
NEUT#: 11.2 10*3/uL — ABNORMAL HIGH (ref 1.5–6.5)
NEUT%: 87.2 % — ABNORMAL HIGH (ref 39.0–75.0)
Platelets: 281 10*3/uL (ref 140–400)
RBC: 3.63 10*6/uL — ABNORMAL LOW (ref 4.20–5.82)
RDW: 24.5 % — ABNORMAL HIGH (ref 11.0–14.6)
WBC: 12.8 10*3/uL — ABNORMAL HIGH (ref 4.0–10.3)
lymph#: 0.6 10*3/uL — ABNORMAL LOW (ref 0.9–3.3)
nRBC: 0 % (ref 0–0)

## 2013-06-18 LAB — COMPREHENSIVE METABOLIC PANEL (CC13)
ALT: 10 U/L (ref 0–55)
AST: 12 U/L (ref 5–34)
Albumin: 3.2 g/dL — ABNORMAL LOW (ref 3.5–5.0)
Alkaline Phosphatase: 91 U/L (ref 40–150)
Anion Gap: 7 mEq/L (ref 3–11)
BUN: 25.9 mg/dL (ref 7.0–26.0)
CO2: 21 mEq/L — ABNORMAL LOW (ref 22–29)
Calcium: 8.2 mg/dL — ABNORMAL LOW (ref 8.4–10.4)
Chloride: 112 mEq/L — ABNORMAL HIGH (ref 98–109)
Creatinine: 1.1 mg/dL (ref 0.7–1.3)
Glucose: 258 mg/dl — ABNORMAL HIGH (ref 70–140)
Potassium: 6 mEq/L — ABNORMAL HIGH (ref 3.5–5.1)
Sodium: 140 mEq/L (ref 136–145)
Total Bilirubin: 0.37 mg/dL (ref 0.20–1.20)
Total Protein: 6.1 g/dL — ABNORMAL LOW (ref 6.4–8.3)

## 2013-06-18 MED ORDER — SODIUM CHLORIDE 0.9 % IV SOLN
375.0000 mg/m2 | Freq: Once | INTRAVENOUS | Status: AC
  Administered 2013-06-18: 900 mg via INTRAVENOUS
  Filled 2013-06-18: qty 90

## 2013-06-18 MED ORDER — SODIUM CHLORIDE 0.9 % IV SOLN
750.0000 mg/m2 | Freq: Once | INTRAVENOUS | Status: AC
  Administered 2013-06-18: 1720 mg via INTRAVENOUS
  Filled 2013-06-18: qty 86

## 2013-06-18 MED ORDER — DOXORUBICIN HCL CHEMO IV INJECTION 2 MG/ML
50.0000 mg/m2 | Freq: Once | INTRAVENOUS | Status: AC
  Administered 2013-06-18: 114 mg via INTRAVENOUS
  Filled 2013-06-18: qty 57

## 2013-06-18 MED ORDER — SODIUM CHLORIDE 0.9 % IV SOLN
Freq: Once | INTRAVENOUS | Status: AC
  Administered 2013-06-18: 11:00:00 via INTRAVENOUS

## 2013-06-18 MED ORDER — ACETAMINOPHEN 325 MG PO TABS
650.0000 mg | ORAL_TABLET | Freq: Once | ORAL | Status: AC
  Administered 2013-06-18: 650 mg via ORAL

## 2013-06-18 MED ORDER — HEPARIN SOD (PORK) LOCK FLUSH 100 UNIT/ML IV SOLN
500.0000 [IU] | Freq: Once | INTRAVENOUS | Status: AC | PRN
  Administered 2013-06-18: 500 [IU]
  Filled 2013-06-18: qty 5

## 2013-06-18 MED ORDER — ACETAMINOPHEN 325 MG PO TABS
ORAL_TABLET | ORAL | Status: AC
  Filled 2013-06-18: qty 2

## 2013-06-18 MED ORDER — DEXAMETHASONE SODIUM PHOSPHATE 20 MG/5ML IJ SOLN
20.0000 mg | Freq: Once | INTRAMUSCULAR | Status: AC
  Administered 2013-06-18: 20 mg via INTRAVENOUS

## 2013-06-18 MED ORDER — DEXAMETHASONE SODIUM PHOSPHATE 20 MG/5ML IJ SOLN
INTRAMUSCULAR | Status: AC
  Filled 2013-06-18: qty 5

## 2013-06-18 MED ORDER — DIPHENHYDRAMINE HCL 25 MG PO CAPS
50.0000 mg | ORAL_CAPSULE | Freq: Once | ORAL | Status: AC
  Administered 2013-06-18: 50 mg via ORAL

## 2013-06-18 MED ORDER — DIPHENHYDRAMINE HCL 25 MG PO CAPS
ORAL_CAPSULE | ORAL | Status: AC
  Filled 2013-06-18: qty 2

## 2013-06-18 MED ORDER — LIDOCAINE-PRILOCAINE 2.5-2.5 % EX CREA
TOPICAL_CREAM | CUTANEOUS | Status: AC
  Filled 2013-06-18: qty 5

## 2013-06-18 MED ORDER — VINCRISTINE SULFATE CHEMO INJECTION 1 MG/ML
2.0000 mg | Freq: Once | INTRAVENOUS | Status: AC
  Administered 2013-06-18: 2 mg via INTRAVENOUS
  Filled 2013-06-18: qty 2

## 2013-06-18 MED ORDER — SODIUM CHLORIDE 0.9 % IJ SOLN
10.0000 mL | INTRAMUSCULAR | Status: DC | PRN
  Administered 2013-06-18: 10 mL
  Filled 2013-06-18: qty 10

## 2013-06-18 MED ORDER — ONDANSETRON 16 MG/50ML IVPB (CHCC)
16.0000 mg | Freq: Once | INTRAVENOUS | Status: AC
  Administered 2013-06-18: 16 mg via INTRAVENOUS

## 2013-06-18 MED ORDER — ONDANSETRON 16 MG/50ML IVPB (CHCC)
INTRAVENOUS | Status: AC
  Filled 2013-06-18: qty 16

## 2013-06-18 NOTE — Progress Notes (Signed)
Hematology and Oncology Follow Up Visit  John [name] [id number] [birth year] 62 y.o. 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   Prior Therapy: He is status post exploratory laparotomy and excision of an intra-abdominal mass and ileocolectomy in a Port-A-Cath placement on 02/09/2013  Current therapy:  Chemotherapy with CHOP-R cycle one given on 04/16/2013. Status post 3 cycles. He is here for cycle 4 today.  Interim History:  John [name] presents today for a followup visit with his wife. He is a pleasant man and seen for followup prior to chemotherapy today. Currently, his abdominal wound has healed completely however the incision remains somewhat dry.Marland Kitchen He denies fevers and drainage from his wound. He reports some mild occasional shortness of breath with activity but is able to perform his activities of daily living without difficulty. He continues to have some fatigue but overall feels that this has improved significantly. His LE swelling is improved, particularly as it involves his right lower extremity. He recently saw dermatologist regarding the history of a right lower extremity wound and swelling and redness. He was prescribed clobetasol cream and has noticed significant decrease in the amount of redness and swelling is gradually going down as well. He denies any problems with nausea or vomiting but has had some issues with constipation that is well manage with milk of magnesia taken at bedtime.  Chemotherapy continue to be well tolerated without any complications. Continues to have some mild pain after the Neulasta injections but is globally well tolerated. He's had some blood sugar elevations related to the prednisone. This  is managed with sliding scale insulin. He had some discomfort in the right flank area however this has resolved.  Medications: I have reviewed the patient's current medications.  Current Outpatient Prescriptions  Medication Sig Dispense Refill  . amLODipine (NORVASC) 10 MG tablet Take 5 mg by mouth daily. Patient takes in the pm      . aspirin 81 MG tablet Take 81 mg by mouth daily.       . beta carotene w/minerals (OCUVITE) tablet Take 1 tablet by mouth daily.       . Clobetasol Prop Emollient Base (CLOBETASOL PROPIONATE E) 0.05 % emollient cream Apply 1 application topically 2 (two) times daily as needed. To affected areas      . digoxin (LANOXIN) 0.25 MG tablet Take 0.25 mg by mouth every morning.       . docusate sodium (COLACE) 100 MG capsule Take 100 mg by mouth daily.      Marland Kitchen esomeprazole (NEXIUM) 40 MG capsule Take 40 mg by mouth at bedtime.       Marland Kitchen ezetimibe-simvastatin (VYTORIN) 10-40 MG per tablet Take 1 tablet by mouth at bedtime.      . furosemide (LASIX) 40 MG tablet Take 40 mg by mouth daily.       Marland Kitchen glipiZIDE (GLUCOTROL XL) 10 MG 24 hr tablet Take 10 mg by mouth daily.       Marland Kitchen HYDROcodone-acetaminophen (NORCO) 5-325 MG per tablet Take 1 tablet by mouth every 6 (six) hours as needed for pain.  30 tablet  0  . insulin glargine (LANTUS) 100 UNIT/ML injection Inject 85 Units into the skin at bedtime.       . iron polysaccharides (NIFEREX) 150 MG  capsule Take 150 mg by mouth daily.      Marland Kitchen lidocaine-prilocaine (EMLA) cream Apply topically as needed.  30 g  0  . magnesium hydroxide (MILK OF MAGNESIA) 400 MG/5ML suspension Take 30 mLs by mouth daily as needed for constipation.  360 mL  0  . metFORMIN (GLUCOPHAGE) 1000 MG tablet Take 1,000 mg by mouth 2 (two) times daily.       . metoprolol (TOPROL-XL) 200 MG 24 hr tablet Take 200 mg by mouth daily after breakfast.       . Misc Natural Products (OSTEO BI-FLEX ADV TRIPLE ST) TABS Take 500 each by mouth 2 (two) times daily.       .  nitroGLYCERIN (NITROSTAT) 0.4 MG SL tablet Place 0.4 mg under the tongue every 5 (five) minutes as needed for chest pain.      . Omega-3 Fatty Acids (FISH OIL) 1000 MG CAPS Take 1 capsule by mouth 2 (two) times daily.       . ondansetron (ZOFRAN) 8 MG tablet Take 1 tablet (8 mg total) by mouth every 8 (eight) hours as needed for nausea.  20 tablet  2  . potassium chloride SA (K-DUR,KLOR-CON) 20 MEQ tablet Take 10 mEq by mouth daily.       . predniSONE (DELTASONE) 50 MG tablet Take 2 tablets for 5 days with each chemotherapy cycle.  60 tablet  1  . ramipril (ALTACE) 5 MG capsule Take 5 mg by mouth 2 (two) times daily.       . sildenafil (VIAGRA) 100 MG tablet Take 100 mg by mouth as needed.      . sitaGLIPtin (JANUVIA) 100 MG tablet Take 100 mg by mouth daily.       Marland Kitchen warfarin (COUMADIN) 5 MG tablet Take 5-7.5 mg by mouth daily. Wed take 7.5 mg, and 5 mg on all other days       No current facility-administered medications for this visit.   Facility-Administered Medications Ordered in Other Visits  Medication Dose Route Frequency Provider Last Rate Last Dose  . heparin lock flush 100 unit/mL  500 Units Intracatheter Once PRN Wyatt Portela, MD      . riTUXimab (RITUXAN) 900 mg in sodium chloride 0.9 % 160 mL chemo infusion  375 mg/m2 (Treatment Plan Actual) Intravenous Once Wyatt Portela, MD      . sodium chloride 0.9 % injection 10 mL  10 mL Intracatheter PRN Wyatt Portela, MD         Allergies: No Known Allergies  Past Medical History, Surgical history, Social history, and Family History were reviewed and updated.  Review of Systems:  Remaining ROS negative.  Physical Exam: Blood pressure 147/61, pulse 66, temperature 97.8 F (36.6 C), temperature source Oral, resp. rate 20, height 6' (1.829 m), weight 235 lb 11.2 oz (106.913 kg). ECOG: 1 General appearance: alert Head: Normocephalic, without obvious abnormality, atraumatic Neck: no adenopathy, no carotid bruit, no JVD, supple,  symmetrical, trachea midline and thyroid not enlarged, symmetric, no tenderness/mass/nodules Lymph nodes: Cervical, supraclavicular, and axillary nodes normal. Heart:regular rate and rhythm, S1, S2 normal, no murmur, click, rub or gallop Lung:chest clear, no wheezing, rales, normal symmetric air entry Abdomen: soft, non-tender, without masses or organomegaly EXT:no erythema, induration, or nodules Abdominal wound completely healed. Incision a bit dry particularly centrally but no areas of skin breakdown. There is no oozing of any serosanguineous or purulent material.   Lab Results: Lab Results  Component Value Date   WBC 12.8*  06/18/2013   HGB 10.3* 06/18/2013   HCT 32.5* 06/18/2013   MCV 89.5 06/18/2013   PLT 281 06/18/2013     Chemistry      Component Value Date/Time   NA 140 06/18/2013 0936   NA 142 02/19/2013 0529   K 6.0 No visable hemolysis, repeated and verified* 06/18/2013 0936   K 3.2* 02/19/2013 0529   CL 107 02/19/2013 0529   CL 106 01/21/2013 1050   CO2 21* 06/18/2013 0936   CO2 23 02/19/2013 0529   BUN 25.9 06/18/2013 0936   BUN 12 02/19/2013 0529   CREATININE 1.1 06/18/2013 0936   CREATININE 0.77 02/19/2013 0529      Component Value Date/Time   CALCIUM 8.2* 06/18/2013 0936   CALCIUM 8.0* 02/19/2013 0529   ALKPHOS 91 06/18/2013 0936   AST 12 06/18/2013 0936   ALT 10 06/18/2013 0936   BILITOT 0.37 06/18/2013 0936      Impression and Plan: 62 year old gentleman with the following issues:  1. 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 He is status post excisional biopsy and ileocolectomy which confirmed the presence of this lymphoma. CT scan results from 8/6 showed no residual disease. Patient was reviewed with Dr. Bobbye Charleston.. He will proceed with cycle #4 of his systemic chemotherapy with CHOP-R. as scheduled. He will have a restaging PET scan on 07/07/2013 and followup with Dr. Alen Blew on 07/09/2013 with the possibility of  proceeding with cycle #5 and 6 pending the results of the PET scan.   2. Anemia: Probably multifactorial in nature  Stable. He has no active bleeding. No transfusion is indicated.   3. Abdominal wound: Completely healed although dry. Patient advised to use topical emollients of this area as well as for his generalized skin dryness   4. IV access: PAC in place.  5. Nausea prophylaxis: On Zofran.   6. Neutropenia prophylaxis: He will receive Neulasta after each cycle of chemotherapy.  7. Dermatitis of his leg: Continues to improve with steroid cream.  8. Followup: In 3 weeks with restaging PET scan as described above. Patient may proceed with cycle #5 pending the outcome of the restaging PET CT scan   John [name] E 10/9/20141:17 PM

## 2013-06-18 NOTE — Patient Instructions (Signed)
Continue with labs as scheduled Keep the appointment for your PET scan to reevaluate her disease Followup with Dr. Alen Blew in 3 weeks

## 2013-06-18 NOTE — Progress Notes (Signed)
Patient reports seeing shadows in the lower part of vision. K+ 6.0 (repeated and verified). MD notified. No further orders given.

## 2013-06-18 NOTE — Patient Instructions (Signed)
[facility] Discharge Instructions for Patients Receiving Chemotherapy  Today you received the following chemotherapy agents: adriamycin, vincristine, cytoxan, rituxan  To help prevent nausea and vomiting after your treatment, we encourage you to take your nausea medication.  Take it as often as prescribed.     If you develop nausea and vomiting that is not controlled by your nausea medication, call the clinic. If it is after clinic hours your family physician or the after hours number for the clinic or go to the Emergency Department.   BELOW ARE SYMPTOMS THAT SHOULD BE REPORTED IMMEDIATELY:  *FEVER GREATER THAN 100.5 F  *CHILLS WITH OR WITHOUT FEVER  NAUSEA AND VOMITING THAT IS NOT CONTROLLED WITH YOUR NAUSEA MEDICATION  *UNUSUAL SHORTNESS OF BREATH  *UNUSUAL BRUISING OR BLEEDING  TENDERNESS IN MOUTH AND THROAT WITH OR WITHOUT PRESENCE OF ULCERS  *URINARY PROBLEMS  *BOWEL PROBLEMS  UNUSUAL RASH Items with * indicate a potential emergency and should be followed up as soon as possible.  Feel free to call the clinic you have any questions or concerns. The clinic phone number is [phone].   I have been informed and understand all the instructions given to me. I know to contact the clinic, my physician, or go to the Emergency Department if any problems should occur. I do not have any questions at this time, but understand that I may call the clinic during office hours   should I have any questions or need assistance in obtaining follow up care.    __________________________________________  _____________  __________ Signature of Patient or Authorized Representative            Date                   Time    __________________________________________ Nurse's Signature

## 2013-06-19 ENCOUNTER — Ambulatory Visit: Payer: BC Managed Care – PPO | Admitting: Oncology

## 2013-06-19 ENCOUNTER — Ambulatory Visit (HOSPITAL_BASED_OUTPATIENT_CLINIC_OR_DEPARTMENT_OTHER): Payer: BC Managed Care – PPO

## 2013-06-19 VITALS — BP 135/44 | HR 70 | Temp 97.7°F

## 2013-06-19 DIAGNOSIS — C8299 Follicular lymphoma, unspecified, extranodal and solid organ sites: Secondary | ICD-10-CM

## 2013-06-19 DIAGNOSIS — Z5189 Encounter for other specified aftercare: Secondary | ICD-10-CM

## 2013-06-19 MED ORDER — PEGFILGRASTIM INJECTION 6 MG/0.6ML
6.0000 mg | Freq: Once | SUBCUTANEOUS | Status: AC
  Administered 2013-06-19: 6 mg via SUBCUTANEOUS
  Filled 2013-06-19: qty 0.6

## 2013-07-02 ENCOUNTER — Other Ambulatory Visit: Payer: Self-pay | Admitting: Oncology

## 2013-07-02 DIAGNOSIS — C8299 Follicular lymphoma, unspecified, extranodal and solid organ sites: Secondary | ICD-10-CM

## 2013-07-06 ENCOUNTER — Telehealth: Payer: Self-pay | Admitting: *Deleted

## 2013-07-06 ENCOUNTER — Encounter: Payer: Self-pay | Admitting: Oncology

## 2013-07-06 NOTE — Telephone Encounter (Signed)
NOTIFIED PT. CONCERNING THE CANCELLATION OF HIS PET SCAN AND THAT RADIOLOGY WILL CALL WITH THE TIME FOR HIS CT SCAN. PT. VOICES UNDERSTANDING.

## 2013-07-06 NOTE — Progress Notes (Unsigned)
07/06/2013  Per a conversation with the nurse reviewer for [facility] [place], this policy does not allow for PET SCAN Restaging until the patient is FOUR WEEKS OUT FROM CHEMOTHERAPY.  The CAT scans do not require pre-cert.  Stanford Breed [phone]

## 2013-07-07 ENCOUNTER — Encounter (HOSPITAL_COMMUNITY): Payer: Self-pay

## 2013-07-07 ENCOUNTER — Ambulatory Visit (HOSPITAL_COMMUNITY)
Admission: RE | Admit: 2013-07-07 | Discharge: 2013-07-07 | Disposition: A | Discharge status: Home / Self Care | Payer: BC Managed Care – PPO | Source: Ambulatory Visit | Attending: Oncology | Admitting: Oncology

## 2013-07-07 ENCOUNTER — Encounter (HOSPITAL_COMMUNITY): Admission: RE | Admit: 2013-07-07 | Payer: BC Managed Care – PPO | Source: Ambulatory Visit

## 2013-07-07 DIAGNOSIS — I700 Atherosclerosis of aorta: Secondary | ICD-10-CM | POA: Insufficient documentation

## 2013-07-07 DIAGNOSIS — C8299 Follicular lymphoma, unspecified, extranodal and solid organ sites: Secondary | ICD-10-CM

## 2013-07-07 DIAGNOSIS — C8589 Other specified types of non-Hodgkin lymphoma, extranodal and solid organ sites: Secondary | ICD-10-CM | POA: Insufficient documentation

## 2013-07-07 DIAGNOSIS — M87059 Idiopathic aseptic necrosis of unspecified femur: Secondary | ICD-10-CM | POA: Insufficient documentation

## 2013-07-07 DIAGNOSIS — R911 Solitary pulmonary nodule: Secondary | ICD-10-CM | POA: Insufficient documentation

## 2013-07-07 DIAGNOSIS — M47814 Spondylosis without myelopathy or radiculopathy, thoracic region: Secondary | ICD-10-CM | POA: Insufficient documentation

## 2013-07-07 DIAGNOSIS — Z951 Presence of aortocoronary bypass graft: Secondary | ICD-10-CM | POA: Insufficient documentation

## 2013-07-07 DIAGNOSIS — R234 Changes in skin texture: Secondary | ICD-10-CM | POA: Insufficient documentation

## 2013-07-07 MED ORDER — IOHEXOL 300 MG/ML  SOLN
50.0000 mL | Freq: Once | INTRAMUSCULAR | Status: AC | PRN
  Administered 2013-07-07: 50 mL via ORAL

## 2013-07-07 MED ORDER — IOHEXOL 300 MG/ML  SOLN
125.0000 mL | Freq: Once | INTRAMUSCULAR | Status: AC | PRN
  Administered 2013-07-07: 125 mL via INTRAVENOUS

## 2013-07-08 ENCOUNTER — Telehealth: Payer: Self-pay | Admitting: *Deleted

## 2013-07-08 NOTE — Telephone Encounter (Signed)
Spoke with wife, per dr Alen Blew, he will discuss CT scan results at visit tomorrow. They are to come to regularly scheduled appt. Wife verbalizes understanding.

## 2013-07-08 NOTE — Telephone Encounter (Signed)
Asking for results of CT scan from 10/28 and if patient still needs to come in for chemo appointment tomorrow?  Forwarded message to Dr. Hazeline Junker nurse.

## 2013-07-09 ENCOUNTER — Encounter (INDEPENDENT_AMBULATORY_CARE_PROVIDER_SITE_OTHER): Payer: Self-pay

## 2013-07-09 ENCOUNTER — Ambulatory Visit (HOSPITAL_BASED_OUTPATIENT_CLINIC_OR_DEPARTMENT_OTHER): Payer: BC Managed Care – PPO | Admitting: Oncology

## 2013-07-09 ENCOUNTER — Ambulatory Visit: Payer: BC Managed Care – PPO

## 2013-07-09 ENCOUNTER — Other Ambulatory Visit (HOSPITAL_BASED_OUTPATIENT_CLINIC_OR_DEPARTMENT_OTHER): Payer: BC Managed Care – PPO | Admitting: Lab

## 2013-07-09 ENCOUNTER — Telehealth: Payer: Self-pay | Admitting: Oncology

## 2013-07-09 VITALS — BP 128/36 | HR 55 | Temp 97.0°F | Resp 18 | Ht 72.0 in | Wt 229.3 lb

## 2013-07-09 DIAGNOSIS — D649 Anemia, unspecified: Secondary | ICD-10-CM

## 2013-07-09 DIAGNOSIS — C8588 Other specified types of non-Hodgkin lymphoma, lymph nodes of multiple sites: Secondary | ICD-10-CM

## 2013-07-09 DIAGNOSIS — C8299 Follicular lymphoma, unspecified, extranodal and solid organ sites: Secondary | ICD-10-CM

## 2013-07-09 DIAGNOSIS — R0602 Shortness of breath: Secondary | ICD-10-CM

## 2013-07-09 DIAGNOSIS — L259 Unspecified contact dermatitis, unspecified cause: Secondary | ICD-10-CM

## 2013-07-09 LAB — COMPREHENSIVE METABOLIC PANEL (CC13)
ALT: 13 U/L (ref 0–55)
AST: 19 U/L (ref 5–34)
Albumin: 3.4 g/dL — ABNORMAL LOW (ref 3.5–5.0)
Alkaline Phosphatase: 102 U/L (ref 40–150)
Anion Gap: 11 mEq/L (ref 3–11)
BUN: 39.1 mg/dL — ABNORMAL HIGH (ref 7.0–26.0)
CO2: 24 mEq/L (ref 22–29)
Calcium: 9.1 mg/dL (ref 8.4–10.4)
Chloride: 106 mEq/L (ref 98–109)
Creatinine: 1.3 mg/dL (ref 0.7–1.3)
Glucose: 247 mg/dl — ABNORMAL HIGH (ref 70–140)
Potassium: 5.2 mEq/L — ABNORMAL HIGH (ref 3.5–5.1)
Sodium: 141 mEq/L (ref 136–145)
Total Bilirubin: 0.44 mg/dL (ref 0.20–1.20)
Total Protein: 6.5 g/dL (ref 6.4–8.3)

## 2013-07-09 LAB — CBC WITH DIFFERENTIAL/PLATELET
BASO%: 1.8 % (ref 0.0–2.0)
Basophils Absolute: 0.1 10*3/uL (ref 0.0–0.1)
EOS%: 1.1 % (ref 0.0–7.0)
Eosinophils Absolute: 0.1 10*3/uL (ref 0.0–0.5)
HCT: 31 % — ABNORMAL LOW (ref 38.4–49.9)
HGB: 10.1 g/dL — ABNORMAL LOW (ref 13.0–17.1)
LYMPH%: 13.5 % — ABNORMAL LOW (ref 14.0–49.0)
MCH: 30 pg (ref 27.2–33.4)
MCHC: 32.6 g/dL (ref 32.0–36.0)
MCV: 92 fL (ref 79.3–98.0)
MONO#: 1.2 10*3/uL — ABNORMAL HIGH (ref 0.1–0.9)
MONO%: 18.1 % — ABNORMAL HIGH (ref 0.0–14.0)
NEUT#: 4.3 10*3/uL (ref 1.5–6.5)
NEUT%: 65.5 % (ref 39.0–75.0)
Platelets: 243 10*3/uL (ref 140–400)
RBC: 3.37 10*6/uL — ABNORMAL LOW (ref 4.20–5.82)
RDW: 21.4 % — ABNORMAL HIGH (ref 11.0–14.6)
WBC: 6.6 10*3/uL (ref 4.0–10.3)
lymph#: 0.9 10*3/uL (ref 0.9–3.3)

## 2013-07-09 NOTE — Progress Notes (Signed)
Hematology and Oncology Follow Up Visit  Deral Christians [id number] 1950/12/11 62 y.o. 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   Prior Therapy: He is status post exploratory laparotomy and excision of an intra-abdominal mass and ileocolectomy in a Port-A-Cath placement on 02/09/2013  Current therapy:  Chemotherapy with CHOP-R cycle one given on 04/16/2013. He is status post 4 cycles of chemotherapy.  Interim History:  Mr. Hinchee presents today for a followup visit with his wife. He have tolerated the last chemotherapy with more complications. He reported more weakness fatigue and tiredness. He have reported more exertional dyspnea and did not take prednisone due to hypoglycemia associated with it.Marland Kitchen He denies fevers but reports some mild occasional shortness of breath with activity but is able to perform his activities of daily living without difficulty. His LE swelling is improved, particularly as it involves his right lower extremity. He recently saw dermatologist regarding the history of a right lower extremity wound and swelling and redness. He was prescribed clobetasol cream and has noticed significant decrease in the amount of redness and swelling is gradually going down as well. He denies any problems with nausea or vomiting but has had some issues with constipation that is well manage with milk of magnesia taken at bedtime.    Medications: I have reviewed the patient's current medications.  Current Outpatient Prescriptions  Medication Sig Dispense Refill  . amLODipine (NORVASC) 10 MG tablet Take 5 mg by mouth daily. Patient takes in the pm      . aspirin 81 MG tablet Take 81 mg by mouth daily.       . beta carotene  w/minerals (OCUVITE) tablet Take 1 tablet by mouth daily.       . Clobetasol Prop Emollient Base (CLOBETASOL PROPIONATE E) 0.05 % emollient cream Apply 1 application topically 2 (two) times daily as needed. To affected areas      . digoxin (LANOXIN) 0.25 MG tablet Take 0.25 mg by mouth every morning.       . docusate sodium (COLACE) 100 MG capsule Take 100 mg by mouth daily.      Marland Kitchen esomeprazole (NEXIUM) 40 MG capsule Take 40 mg by mouth at bedtime.       Marland Kitchen ezetimibe-simvastatin (VYTORIN) 10-40 MG per tablet Take 1 tablet by mouth at bedtime.      . furosemide (LASIX) 40 MG tablet Take 40 mg by mouth daily.       Marland Kitchen glipiZIDE (GLUCOTROL XL) 10 MG 24 hr tablet Take 10 mg by mouth daily.       Marland Kitchen HYDROcodone-acetaminophen (NORCO) 5-325 MG per tablet Take 1 tablet by mouth every 6 (six) hours as needed for pain.  30 tablet  0  . insulin glargine (LANTUS) 100 UNIT/ML injection Inject 85 Units into the skin at bedtime.       . iron polysaccharides (NIFEREX) 150 MG capsule Take 150 mg by mouth daily.      Marland Kitchen lidocaine-prilocaine (EMLA) cream Apply topically as needed.  30 g  0  . magnesium hydroxide (MILK OF MAGNESIA) 400 MG/5ML suspension Take 30 mLs by mouth daily as needed for constipation.  360 mL  0  . metFORMIN (GLUCOPHAGE) 1000 MG tablet Take 1,000 mg by mouth 2 (two) times daily.       Marland Kitchen  metoprolol (TOPROL-XL) 200 MG 24 hr tablet Take 200 mg by mouth daily after breakfast.       . Misc Natural Products (OSTEO BI-FLEX ADV TRIPLE ST) TABS Take 500 each by mouth 2 (two) times daily.       . nitroGLYCERIN (NITROSTAT) 0.4 MG SL tablet Place 0.4 mg under the tongue every 5 (five) minutes as needed for chest pain.      . Omega-3 Fatty Acids (FISH OIL) 1000 MG CAPS Take 1 capsule by mouth 2 (two) times daily.       . ondansetron (ZOFRAN) 8 MG tablet Take 1 tablet (8 mg total) by mouth every 8 (eight) hours as needed for nausea.  20 tablet  2  . potassium chloride SA (K-DUR,KLOR-CON) 20 MEQ tablet Take 10  mEq by mouth daily.       . predniSONE (DELTASONE) 50 MG tablet Take 2 tablets for 5 days with each chemotherapy cycle.  60 tablet  1  . ramipril (ALTACE) 5 MG capsule Take 5 mg by mouth 2 (two) times daily.       . sildenafil (VIAGRA) 100 MG tablet Take 100 mg by mouth as needed.      . sitaGLIPtin (JANUVIA) 100 MG tablet Take 100 mg by mouth daily.       Marland Kitchen warfarin (COUMADIN) 5 MG tablet Take 5-7.5 mg by mouth daily. Wed take 7.5 mg, and 5 mg on all other days       No current facility-administered medications for this visit.     Allergies: No Known Allergies  Past Medical History, Surgical history, Social history, and Family History were reviewed and updated.  Review of Systems:  Remaining ROS negative.  Physical Exam: Blood pressure 128/36, pulse 55, temperature 97 F (36.1 C), temperature source Oral, resp. rate 18, height 6' (1.829 m), weight 229 lb 4.8 oz (104.01 kg), SpO2 100.00%. ECOG: 1 General appearance: alert Head: Normocephalic, without obvious abnormality, atraumatic Neck: no adenopathy, no carotid bruit, no JVD, supple, symmetrical, trachea midline and thyroid not enlarged, symmetric, no tenderness/mass/nodules Lymph nodes: Cervical, supraclavicular, and axillary nodes normal. Heart:regular rate and rhythm, S1, S2 normal, no murmur, click, rub or gallop Lung:chest clear, no wheezing, rales, normal symmetric air entry Abdomen: soft, non-tender, without masses or organomegaly EXT:no erythema, induration, or nodules Abdominal wound completely healed. Incision a bit dry particularly centrally but no areas of skin breakdown.   Lab Results: Lab Results  Component Value Date   WBC 6.6 07/09/2013   HGB 10.1* 07/09/2013   HCT 31.0* 07/09/2013   MCV 92.0 07/09/2013   PLT 243 07/09/2013     Chemistry      Component Value Date/Time   NA 140 06/18/2013 0936   NA 142 02/19/2013 0529   K 6.0 No visable hemolysis, repeated and verified* 06/18/2013 0936   K 3.2* 02/19/2013  0529   CL 107 02/19/2013 0529   CL 106 01/21/2013 1050   CO2 21* 06/18/2013 0936   CO2 23 02/19/2013 0529   BUN 25.9 06/18/2013 0936   BUN 12 02/19/2013 0529   CREATININE 1.1 06/18/2013 0936   CREATININE 0.77 02/19/2013 0529      Component Value Date/Time   CALCIUM 8.2* 06/18/2013 0936   CALCIUM 8.0* 02/19/2013 0529   ALKPHOS 91 06/18/2013 0936   AST 12 06/18/2013 0936   ALT 10 06/18/2013 0936   BILITOT 0.37 06/18/2013 0936     EXAM:  CT CHEST, ABDOMEN, AND PELVIS WITH CONTRAST  TECHNIQUE:  Multidetector  CT imaging of the chest, abdomen and pelvis was  performed following the standard protocol during bolus  administration of intravenous contrast.  CONTRAST: 5mL OMNIPAQUE IOHEXOL 300 MG/ML SOLN, 174mL OMNIPAQUE  IOHEXOL 300 MG/ML SOLN  COMPARISON: 04/15/2013  FINDINGS:  CT CHEST FINDINGS  No pleural effusion identified. No airspace consolidation or  atelectasis. Stable pulmonary nodule within the right base measuring  5 mm, image 41/ series 6. No new or enlarging pulmonary nodule or  mass identified.  There is no mediastinal or hilar adenopathy identified. Previous  median sternotomy and CABG procedure. No pericardial effusion  identified. There is no axillary or supraclavicular adenopathy.  Review of the visualized bony structures is significant for mild  thoracic spondylosis. No aggressive lytic or sclerotic bone lesions  identified.  CT ABDOMEN AND PELVIS FINDINGS  There is no suspicious liver abnormality. The gallbladder appears  within normal limits. No biliary dilatation. Normal appearance of  the pancreas. The spleen is on unremarkable.  The adrenal glands are both normal. Normal appearance of both  kidneys. The urinary bladder appears normal. Prostate gland and  seminal vesicles are unremarkable.  Calcified atherosclerotic disease affects the abdominal aorta. There  is no aneurysm. No upper abdominal adenopathy identified. There is  no pelvic or inguinal adenopathy noted.   The stomach appears normal. The small bowel loops have a normal  course and caliber without evidence for obstruction. Postoperative  changes are identified at the base of cecum.  Review of the visualized osseous structures is on unremarkable.  No free fluid or abnormal fluid collections noted within the abdomen  or pelvis.  Thickening of the lower ventral abdominal wall is again noted and  appears similar to previous exam. There is overlying skin thickening  and subcutaneous fat stranding.  Review of the visualized osseous structures is significant for mild  spondylosis. 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  IMPRESSION:  1. No acute findings identified within the chest, abdomen or pelvis.  No evidence for residual or recurrence of lymphoma.      Impression and Plan: 62 year old gentleman with the following issues:  1. 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 He is status post excisional biopsy and ileocolectomy which confirmed the presence of this lymphoma. CT scan results from 07/07/2013 reviewed and showed no evidence of disease after 4 cycles of chemotherapy. Risks and benefits of continuing 2 more cycles of chemotherapy versus stopping at this point were discussed. I feel there are more risk associated with continuing chemotherapy the potential benefit. He is already experiencing more complications predominantly hyperglycemia and possible neuropathy and for that reason we will stop any further chemotherapy and continue observation only. I will likely repeat his CT scan in 6 months.   2. Anemia: Probably multifactorial in nature  Stable. He has no active bleeding. No transfusion is indicated.   3. Abdominal wound: Completely healed although dry. Patient advised to use topical emollients of this area as well as for his generalized skin dryness   4. IV access: PAC in place. His will be flushed every 6  weeks.  5. Nausea prophylaxis: On Zofran.   6. Neutropenia prophylaxis: He will receive Neulasta after each cycle of chemotherapy. This will be stopped as well  7. Dermatitis of his leg: Continues to improve with steroid cream.  8. Followup: In 3 months.    Keaden Gunnoe 10/30/20149:23 AM

## 2013-07-09 NOTE — Telephone Encounter (Signed)
Gave pt appt for lab,md and flush for December and January 2015

## 2013-07-10 ENCOUNTER — Ambulatory Visit: Payer: BC Managed Care – PPO

## 2013-07-20 ENCOUNTER — Telehealth: Payer: Self-pay | Admitting: Oncology

## 2013-08-03 ENCOUNTER — Telehealth: Payer: Self-pay | Admitting: Cardiology

## 2013-08-03 NOTE — Telephone Encounter (Signed)
appt scheduled for 12/3 in [place] to f/u after surgery for CA.  Wife aware

## 2013-08-03 NOTE — Telephone Encounter (Signed)
New message  Patient needs an appointment before 08/17/13 due to insurance reasons. Please call patients wife and advise.

## 2013-08-12 ENCOUNTER — Encounter: Payer: Self-pay | Admitting: Cardiology

## 2013-08-12 ENCOUNTER — Encounter: Payer: Self-pay | Admitting: Oncology

## 2013-08-12 ENCOUNTER — Ambulatory Visit (INDEPENDENT_AMBULATORY_CARE_PROVIDER_SITE_OTHER): Payer: BC Managed Care – PPO | Admitting: Cardiology

## 2013-08-12 ENCOUNTER — Ambulatory Visit: Payer: BC Managed Care – PPO | Admitting: Cardiology

## 2013-08-12 VITALS — BP 145/60 | HR 55 | Ht 72.0 in | Wt 229.0 lb

## 2013-08-12 DIAGNOSIS — I4891 Unspecified atrial fibrillation: Secondary | ICD-10-CM

## 2013-08-12 NOTE — Patient Instructions (Signed)
The current medical regimen is effective;  continue present plan and medications.  Follow up in 6 months with Dr Hochrein.  You will receive a letter in the mail 2 months before you are due.  Please call us when you receive this letter to schedule your follow up appointment.  

## 2013-08-12 NOTE — Progress Notes (Signed)
Checked in new patient with no financial issues and he has appt crd.

## 2013-08-12 NOTE — Progress Notes (Signed)
HPI The patient presents for follow of MVR and atrial fib.   I saw him preoperatively prior to having laparotomy for treatment of lymphoma. He is now status post disc surgery as well as treatment with CHOP-R.   He did well with this from a cardiovascular standpoint. He says he's currently cancer free.  He started to get some of his strength back. He is even able to do some dear hunting recently. The patient denies any new symptoms such as chest discomfort, neck or arm discomfort. There has been no new shortness of breath, PND or orthopnea. There have been no reported palpitations, presyncope or syncope.  No Known Allergies  Current Outpatient Prescriptions  Medication Sig Dispense Refill  . amLODipine (NORVASC) 10 MG tablet Take 5 mg by mouth daily. Patient takes in the pm      . aspirin 81 MG tablet Take 81 mg by mouth daily.       . beta carotene w/minerals (OCUVITE) tablet Take 1 tablet by mouth daily.       . Clobetasol Prop Emollient Base (CLOBETASOL PROPIONATE E) 0.05 % emollient cream Apply 1 application topically 2 (two) times daily as needed. To affected areas      . digoxin (LANOXIN) 0.25 MG tablet Take 0.25 mg by mouth every morning.       . docusate sodium (COLACE) 100 MG capsule Take 100 mg by mouth daily.      Marland Kitchen esomeprazole (NEXIUM) 40 MG capsule Take 40 mg by mouth at bedtime.       Marland Kitchen ezetimibe-simvastatin (VYTORIN) 10-40 MG per tablet Take 1 tablet by mouth at bedtime.      . furosemide (LASIX) 40 MG tablet Take 40 mg by mouth daily.       Marland Kitchen glipiZIDE (GLUCOTROL XL) 10 MG 24 hr tablet Take 10 mg by mouth daily.       Marland Kitchen HYDROcodone-acetaminophen (NORCO) 5-325 MG per tablet Take 1 tablet by mouth every 6 (six) hours as needed for pain.  30 tablet  0  . insulin glargine (LANTUS) 100 UNIT/ML injection Inject 85 Units into the skin at bedtime.       . magnesium hydroxide (MILK OF MAGNESIA) 400 MG/5ML suspension Take 30 mLs by mouth daily as needed for constipation.  360 mL  0  .  metFORMIN (GLUCOPHAGE) 1000 MG tablet Take 1,000 mg by mouth 2 (two) times daily.       . metoprolol (TOPROL-XL) 200 MG 24 hr tablet Take 200 mg by mouth daily after breakfast.       . Misc Natural Products (OSTEO BI-FLEX ADV TRIPLE ST) TABS Take 500 each by mouth as needed.       . nitroGLYCERIN (NITROSTAT) 0.4 MG SL tablet Place 0.4 mg under the tongue every 5 (five) minutes as needed for chest pain.      . Omega-3 Fatty Acids (FISH OIL) 1000 MG CAPS Take 1 capsule by mouth 2 (two) times daily.       . potassium chloride SA (K-DUR,KLOR-CON) 20 MEQ tablet Take 10 mEq by mouth daily.       . ramipril (ALTACE) 5 MG capsule Take 5 mg by mouth 2 (two) times daily.       . sildenafil (VIAGRA) 100 MG tablet Take 100 mg by mouth as needed.      . sitaGLIPtin (JANUVIA) 100 MG tablet Take 100 mg by mouth daily.       Marland Kitchen warfarin (COUMADIN) 5 MG tablet Take 5-7.5  mg by mouth daily. Wed take 7.5 mg, and 5 mg on all other days      . lidocaine-prilocaine (EMLA) cream Apply topically as needed.  30 g  0  . ondansetron (ZOFRAN) 8 MG tablet Take 1 tablet (8 mg total) by mouth every 8 (eight) hours as needed for nausea.  20 tablet  2   No current facility-administered medications for this visit.    Past Medical History  Diagnosis Date  . DM (diabetes mellitus)   . HLD (hyperlipidemia)   . HTN (hypertension)   . Rheumatic fever     as a child  . CAD (coronary artery disease)     a. s/p CABG 11/2000 (L-LAD, S-RCA);  b. Lexiscan Myoview 11/12: EF 45%, ischemia and possibly some scar at the base of the inferolateral wall;   c. Lex MV 11/13:  EF 44%, Inf and IL scar/soft tissue atten, small mild amt of inf ischemia,  . Atrial fibrillation     coumadin managed by PCP  . Rheumatic heart disease     a. s/p mechanical ([employer] Jude) MVR 2002;  b. Echo 5/11: Mild LVH, EF 45-50%, mild AS, mild AI, mean gradient 11 mmHg, MVR with normal gradients, moderate LAE, mild RAE;  c.  Echo 11/13:   mod LVH, EF 55-60%, mild AS  (mean 18 mmHg), mild AI, severe LAE, mild RAE, PASP 41  . 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 Anemia   . GERD (gastroesophageal reflux disease)   . ED (erectile dysfunction)   . SOB (shortness of breath)   . Kidney stones     hx of   . Arthritis     back     Past Surgical History  Procedure Laterality Date  . Coronary artery bypass graft      lef tmammary artery to LAD coronary artery and reversed SVG to distal rt coronary artery   . Mitral valve replacement      #33 [employer] mechanical mitral valve prosthesis. Dr. Servando Snare  . Laparotomy N/A 02/09/2013    Procedure: EXPLORATORY LAPAROTOMY WITH incisional biopsy intra- ABDOMINAL MASS ILOCECTOMY ;  Surgeon: Harl Bowie, MD;  Location: [facility];  Service: General;  Laterality: N/A;  . Portacath placement N/A 02/09/2013    Procedure: INSERTION PORT-A-CATH;  Surgeon: Harl Bowie, MD;  Location: [facility];  Service: General;  Laterality: N/A;    ROS:  As stated in the HPI and negative for all other systems.  PHYSICAL EXAM BP 145/60  Pulse 55  Ht 6' (1.829 m)  Wt 229 lb (103.874 kg)  BMI 31.05 kg/m2 GENERAL:  Well appearing HEENT:  Pupils equal round and reactive, fundi not visualized, oral mucosa unremarkable NECK:  No jugular venous distention, waveform within normal limits, carotid upstroke brisk and symmetric, no bruits, no thyromegaly LUNGS:  Clear to auscultation bilaterally BACK:  No CVA tenderness CHEST:  Well healed sternotomy scar, porta cath.  HEART:  PMI not displaced or sustained, mechanical S1,  S2 within normal limits, no S3 , no clicks, no rubs, no murmurs, irregular ABD:  Flat, positive bowel sounds normal in frequency in pitch, no bruits, no rebound, no guarding, no midline pulsatile mass, no hepatomegaly, no splenomegaly, obese EXT:  2 plus pulses throughout,  Mild right leg edema, no cyanosis no clubbing SKIN:  No rashes no nodules,  Large healing wound on his right leg   EKG: Atrial  fibrillation, rate 60 right axis deviation, no acute ST-T wave changes.  08/12/2013   ASSESSMENT AND PLAN  CAD - As above. The patient does have a mildly reduced ejection fraction which has been stable.  No change in therapy is indicated.  No further studies are needed.    ATRIAL FIBRILLATION - The patient  tolerates this rhythm and rate control and anticoagulation. We will continue with the meds as listed.  MECHANICAL MVR -  He has had normal function and tolerates anticoagulation.

## 2013-08-21 ENCOUNTER — Ambulatory Visit (HOSPITAL_BASED_OUTPATIENT_CLINIC_OR_DEPARTMENT_OTHER): Payer: BC Managed Care – PPO

## 2013-08-21 ENCOUNTER — Telehealth: Payer: Self-pay | Admitting: *Deleted

## 2013-08-21 VITALS — BP 133/42 | HR 61 | Temp 96.8°F

## 2013-08-21 DIAGNOSIS — Z95828 Presence of other vascular implants and grafts: Secondary | ICD-10-CM

## 2013-08-21 DIAGNOSIS — Z452 Encounter for adjustment and management of vascular access device: Secondary | ICD-10-CM

## 2013-08-21 DIAGNOSIS — C8588 Other specified types of non-Hodgkin lymphoma, lymph nodes of multiple sites: Secondary | ICD-10-CM

## 2013-08-21 MED ORDER — SODIUM CHLORIDE 0.9 % IJ SOLN
10.0000 mL | INTRAMUSCULAR | Status: DC | PRN
  Administered 2013-08-21: 10 mL via INTRAVENOUS
  Filled 2013-08-21: qty 10

## 2013-08-21 MED ORDER — HEPARIN SOD (PORK) LOCK FLUSH 100 UNIT/ML IV SOLN
500.0000 [IU] | Freq: Once | INTRAVENOUS | Status: AC
  Administered 2013-08-21: 500 [IU] via INTRAVENOUS
  Filled 2013-08-21: qty 5

## 2013-08-21 NOTE — Telephone Encounter (Signed)
No note

## 2013-08-21 NOTE — Telephone Encounter (Signed)
Patient calling to ask if ok to get a flu shot? Yes per dr Alen Blew.

## 2013-08-28 ENCOUNTER — Encounter: Payer: Self-pay | Admitting: Oncology

## 2013-09-10 DIAGNOSIS — J111 Influenza due to unidentified influenza virus with other respiratory manifestations: Secondary | ICD-10-CM

## 2013-09-10 HISTORY — DX: Influenza due to unidentified influenza virus with other respiratory manifestations: J11.1

## 2013-09-15 ENCOUNTER — Ambulatory Visit: Payer: Self-pay | Admitting: Family Medicine

## 2013-10-09 ENCOUNTER — Other Ambulatory Visit (HOSPITAL_BASED_OUTPATIENT_CLINIC_OR_DEPARTMENT_OTHER): Payer: BC Managed Care – PPO

## 2013-10-09 ENCOUNTER — Encounter (INDEPENDENT_AMBULATORY_CARE_PROVIDER_SITE_OTHER): Payer: Self-pay

## 2013-10-09 ENCOUNTER — Encounter: Payer: Self-pay | Admitting: Oncology

## 2013-10-09 ENCOUNTER — Ambulatory Visit (HOSPITAL_COMMUNITY)
Admission: RE | Admit: 2013-10-09 | Discharge: 2013-10-09 | Disposition: A | Discharge status: Home / Self Care | Payer: BC Managed Care – PPO | Source: Ambulatory Visit | Attending: Oncology | Admitting: Oncology

## 2013-10-09 ENCOUNTER — Ambulatory Visit (HOSPITAL_BASED_OUTPATIENT_CLINIC_OR_DEPARTMENT_OTHER): Payer: BC Managed Care – PPO | Admitting: Oncology

## 2013-10-09 ENCOUNTER — Telehealth: Payer: Self-pay | Admitting: Oncology

## 2013-10-09 ENCOUNTER — Ambulatory Visit: Payer: BC Managed Care – PPO

## 2013-10-09 ENCOUNTER — Telehealth: Payer: Self-pay | Admitting: Medical Oncology

## 2013-10-09 VITALS — BP 133/61 | HR 66 | Temp 97.7°F | Resp 18 | Ht 72.0 in | Wt 227.8 lb

## 2013-10-09 DIAGNOSIS — C8588 Other specified types of non-Hodgkin lymphoma, lymph nodes of multiple sites: Secondary | ICD-10-CM

## 2013-10-09 DIAGNOSIS — D649 Anemia, unspecified: Secondary | ICD-10-CM

## 2013-10-09 DIAGNOSIS — R059 Cough, unspecified: Secondary | ICD-10-CM

## 2013-10-09 DIAGNOSIS — C8299 Follicular lymphoma, unspecified, extranodal and solid organ sites: Secondary | ICD-10-CM

## 2013-10-09 DIAGNOSIS — R05 Cough: Secondary | ICD-10-CM

## 2013-10-09 LAB — CBC WITH DIFFERENTIAL/PLATELET
BASO%: 0.7 % (ref 0.0–2.0)
Basophils Absolute: 0.1 10*3/uL (ref 0.0–0.1)
EOS%: 2.8 % (ref 0.0–7.0)
Eosinophils Absolute: 0.2 10*3/uL (ref 0.0–0.5)
HCT: 34.4 % — ABNORMAL LOW (ref 38.4–49.9)
HGB: 11.2 g/dL — ABNORMAL LOW (ref 13.0–17.1)
LYMPH%: 9.3 % — ABNORMAL LOW (ref 14.0–49.0)
MCH: 27.2 pg (ref 27.2–33.4)
MCHC: 32.7 g/dL (ref 32.0–36.0)
MCV: 83.3 fL (ref 79.3–98.0)
MONO#: 0.9 10*3/uL (ref 0.1–0.9)
MONO%: 10.6 % (ref 0.0–14.0)
NEUT#: 6.4 10*3/uL (ref 1.5–6.5)
NEUT%: 76.6 % — ABNORMAL HIGH (ref 39.0–75.0)
Platelets: 240 10*3/uL (ref 140–400)
RBC: 4.12 10*6/uL — ABNORMAL LOW (ref 4.20–5.82)
RDW: 16.7 % — ABNORMAL HIGH (ref 11.0–14.6)
WBC: 8.4 10*3/uL (ref 4.0–10.3)
lymph#: 0.8 10*3/uL — ABNORMAL LOW (ref 0.9–3.3)

## 2013-10-09 LAB — COMPREHENSIVE METABOLIC PANEL (CC13)
ALT: 21 U/L (ref 0–55)
AST: 17 U/L (ref 5–34)
Albumin: 3.6 g/dL (ref 3.5–5.0)
Alkaline Phosphatase: 106 U/L (ref 40–150)
Anion Gap: 12 mEq/L — ABNORMAL HIGH (ref 3–11)
BUN: 44.1 mg/dL — ABNORMAL HIGH (ref 7.0–26.0)
CO2: 24 mEq/L (ref 22–29)
Calcium: 10 mg/dL (ref 8.4–10.4)
Chloride: 104 mEq/L (ref 98–109)
Creatinine: 1.5 mg/dL — ABNORMAL HIGH (ref 0.7–1.3)
Glucose: 225 mg/dl — ABNORMAL HIGH (ref 70–140)
Potassium: 5.1 mEq/L (ref 3.5–5.1)
Sodium: 140 mEq/L (ref 136–145)
Total Bilirubin: 0.48 mg/dL (ref 0.20–1.20)
Total Protein: 7.1 g/dL (ref 6.4–8.3)

## 2013-10-09 MED ORDER — SODIUM CHLORIDE 0.9 % IJ SOLN
10.0000 mL | INTRAMUSCULAR | Status: DC | PRN
  Administered 2013-10-09: 10 mL via INTRAVENOUS
  Filled 2013-10-09: qty 10

## 2013-10-09 MED ORDER — HEPARIN SOD (PORK) LOCK FLUSH 100 UNIT/ML IV SOLN
500.0000 [IU] | Freq: Once | INTRAVENOUS | Status: AC
  Administered 2013-10-09: 500 [IU] via INTRAVENOUS
  Filled 2013-10-09: qty 5

## 2013-10-09 NOTE — Progress Notes (Signed)
Hematology and Oncology Follow Up Visit  John [name] [id number] 09/04/1951 63 y.o. 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   Prior Therapy: He is status post exploratory laparotomy and excision of an intra-abdominal mass and ileocolectomy in a Port-A-Cath placement on 02/09/2013 Chemotherapy with CHOP-R cycle one given on 04/16/2013. He is status post 4 cycles of chemotherapy completed in 06/2013.  Current therapy:  Observation and surveillance.  Interim History:  John [name] presents today for a followup visit with his wife. He completed chemotherapy as mentioned without any complications. He denies fevers but reports some mild occasional shortness of breath with activity but is able to perform his activities of daily living without difficulty. His LE swelling is improved, particularly as it involves his right lower extremity. He denies any problems with nausea or vomiting but has had some issues with constipation that is well manage with milk of magnesia taken at bedtime. Most recently he was diagnosed with pneumonia by his primary care physician and was started on Levaquin. His reporting his cough is not productive at this time and reported no fevers.  Medications: I have reviewed the patient's current medications.  Current Outpatient Prescriptions  Medication Sig Dispense Refill  . amLODipine (NORVASC) 10 MG tablet Take 5 mg by mouth daily. Patient takes in the pm      . aspirin 81 MG tablet Take 81 mg by mouth daily.       . beta carotene w/minerals (OCUVITE) tablet Take 1 tablet by mouth daily.       . Clobetasol Prop Emollient Base (CLOBETASOL PROPIONATE E) 0.05 % emollient cream Apply 1 application topically 2  (two) times daily as needed. To affected areas      . digoxin (LANOXIN) 0.25 MG tablet Take 0.25 mg by mouth every morning.       . docusate sodium (COLACE) 100 MG capsule Take 100 mg by mouth daily.      Marland Kitchen esomeprazole (NEXIUM) 40 MG capsule Take 40 mg by mouth at bedtime.       Marland Kitchen ezetimibe-simvastatin (VYTORIN) 10-40 MG per tablet Take 1 tablet by mouth at bedtime.      . furosemide (LASIX) 40 MG tablet Take 40 mg by mouth daily.       Marland Kitchen glipiZIDE (GLUCOTROL XL) 10 MG 24 hr tablet Take 10 mg by mouth daily.       Marland Kitchen HYDROcodone-acetaminophen (NORCO) 5-325 MG per tablet Take 1 tablet by mouth every 6 (six) hours as needed for pain.  30 tablet  0  . insulin glargine (LANTUS) 100 UNIT/ML injection Inject 85 Units into the skin at bedtime.       . lidocaine-prilocaine (EMLA) cream Apply topically as needed.  30 g  0  . magnesium hydroxide (MILK OF MAGNESIA) 400 MG/5ML suspension Take 30 mLs by mouth daily as needed for constipation.  360 mL  0  . metFORMIN (GLUCOPHAGE) 1000 MG tablet Take 1,000 mg by mouth 2 (two) times daily.       . metoprolol (TOPROL-XL) 200 MG 24 hr tablet Take 200 mg by mouth daily after breakfast.       . Misc Natural Products (OSTEO BI-FLEX ADV TRIPLE ST) TABS Take 500 each by mouth as needed.       Marland Kitchen  nitroGLYCERIN (NITROSTAT) 0.4 MG SL tablet Place 0.4 mg under the tongue every 5 (five) minutes as needed for chest pain.      . Omega-3 Fatty Acids (FISH OIL) 1000 MG CAPS Take 1 capsule by mouth 2 (two) times daily.       . ondansetron (ZOFRAN) 8 MG tablet Take 1 tablet (8 mg total) by mouth every 8 (eight) hours as needed for nausea.  20 tablet  2  . potassium chloride SA (K-DUR,KLOR-CON) 20 MEQ tablet Take 10 mEq by mouth daily.       . ramipril (ALTACE) 5 MG capsule Take 5 mg by mouth 2 (two) times daily.       . sildenafil (VIAGRA) 100 MG tablet Take 100 mg by mouth as needed.      . sitaGLIPtin (JANUVIA) 100 MG tablet Take 100 mg by mouth daily.       Marland Kitchen warfarin  (COUMADIN) 5 MG tablet Take 5-7.5 mg by mouth daily. Wed take 7.5 mg, and 5 mg on all other days       Current Facility-Administered Medications  Medication Dose Route Frequency Provider Last Rate Last Dose  . heparin lock flush 100 unit/mL  500 Units Intravenous Once Wyatt Portela, MD      . sodium chloride 0.9 % injection 10 mL  10 mL Intravenous PRN Wyatt Portela, MD         Allergies: No Known Allergies  Past Medical History, Surgical history, Social history, and Family History were reviewed and updated.  Review of Systems:  Remaining ROS negative.  Physical Exam: Blood pressure 133/61, pulse 66, temperature 97.7 F (36.5 C), temperature source Oral, resp. rate 18, height 6' (1.829 m), weight 227 lb 12.8 oz (103.329 kg). ECOG: 1 General appearance: alert Head: Normocephalic, without obvious abnormality, atraumatic Neck: no adenopathy, no carotid bruit, no JVD, supple, symmetrical, trachea midline and thyroid not enlarged, symmetric, no tenderness/mass/nodules Lymph nodes: Cervical, supraclavicular, and axillary nodes normal. Heart:regular rate and rhythm, S1, S2 normal, no murmur, click, rub or gallop Lung:chest clear, no wheezing, rales, normal symmetric air entry Abdomen: soft, non-tender, without masses or organomegaly EXT:no erythema, induration, or nodules Abdominal wound completely healed. Incision a bit dry particularly centrally but no areas of skin breakdown.   Lab Results: Lab Results  Component Value Date   WBC 8.4 10/09/2013   HGB 11.2* 10/09/2013   HCT 34.4* 10/09/2013   MCV 83.3 10/09/2013   PLT 240 10/09/2013     Chemistry      Component Value Date/Time   NA 141 07/09/2013 0844   NA 142 02/19/2013 0529   K 5.2* 07/09/2013 0844   K 3.2* 02/19/2013 0529   CL 107 02/19/2013 0529   CL 106 01/21/2013 1050   CO2 24 07/09/2013 0844   CO2 23 02/19/2013 0529   BUN 39.1* 07/09/2013 0844   BUN 12 02/19/2013 0529   CREATININE 1.3 07/09/2013 0844   CREATININE 0.77  02/19/2013 0529      Component Value Date/Time   CALCIUM 9.1 07/09/2013 0844   CALCIUM 8.0* 02/19/2013 0529   ALKPHOS 102 07/09/2013 0844   AST 19 07/09/2013 0844   ALT 13 07/09/2013 0844   BILITOT 0.44 07/09/2013 0844     Impression and Plan: 63 year old gentleman with the following issues:  1. 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 He is status post excisional biopsy and ileocolectomy which confirmed the presence of this lymphoma. CT scan  results from 07/07/2013  showed no evidence of disease after 4 cycles of chemotherapy.  I will repeat his CT scan in April of 2015 for surveillance purposes.    2. Anemia: Probably multifactorial in nature  Stable. He has no active bleeding. No transfusion is indicated.   3. Abdominal wound: Completely healed although dry. Patient advised to use topical emollients of this area as well as for his generalized skin dryness   4. IV access: PAC in place. His will be flushed every 6 weeks. This will be removed if his CT scan is clear in 3 months.  5. Productive cough: Likely related to bronchitis for possible pneumonia I will obtain a chest x-ray today to make sure that his pneumonia is improving.  6. Neutropenia prophylaxis: This is no longer needed.  7. Followup: Will be in 3 months after the CT scan.     [id number] 1/30/20159:04 AM

## 2013-10-09 NOTE — Telephone Encounter (Signed)
per central schedi;omg - changed time of 4/14 lb appt from 830am to 930 am to coord w/ct . lmonvm informing pt and mailed new schedule.

## 2013-10-09 NOTE — Telephone Encounter (Signed)
gv adn printed aptp sched and avs for pt for April....sed gv barium

## 2013-10-09 NOTE — Telephone Encounter (Signed)
Message copied by Maxwell Marion on Fri Oct 09, 2013 10:55 AM ------      Message from: Wyatt Portela      Created: Fri Oct 09, 2013 10:36 AM       Please call his result of chest xray. All normal. ------

## 2013-10-09 NOTE — Telephone Encounter (Signed)
Per MD, informed patient chest xray all normal. Patient expressed thanks, denies questions. Encouraged patient to call clinic with any questions or concerns.

## 2013-10-09 NOTE — Progress Notes (Signed)
Flushed by Desk nurse

## 2013-10-20 ENCOUNTER — Telehealth (INDEPENDENT_AMBULATORY_CARE_PROVIDER_SITE_OTHER): Payer: Self-pay

## 2013-10-20 NOTE — Telephone Encounter (Signed)
He can see his primary care. Nothing to do from our standpoint Can always see urgent office if needed

## 2013-10-20 NOTE — Telephone Encounter (Signed)
Wife states patient belly  button  is bruised and tender from coughing form the Flu , I did not see anything for Dr. Ninfa Linden next 2 weeks Patient is seeing his PCP next week please advise

## 2013-11-20 ENCOUNTER — Ambulatory Visit (HOSPITAL_BASED_OUTPATIENT_CLINIC_OR_DEPARTMENT_OTHER): Payer: BC Managed Care – PPO

## 2013-11-20 VITALS — BP 143/51 | HR 62 | Temp 97.8°F

## 2013-11-20 DIAGNOSIS — C8590 Non-Hodgkin lymphoma, unspecified, unspecified site: Secondary | ICD-10-CM

## 2013-11-20 DIAGNOSIS — C8588 Other specified types of non-Hodgkin lymphoma, lymph nodes of multiple sites: Secondary | ICD-10-CM

## 2013-11-20 DIAGNOSIS — Z452 Encounter for adjustment and management of vascular access device: Secondary | ICD-10-CM

## 2013-11-20 MED ORDER — SODIUM CHLORIDE 0.9 % IJ SOLN
10.0000 mL | INTRAMUSCULAR | Status: DC | PRN
  Administered 2013-11-20: 10 mL via INTRAVENOUS
  Filled 2013-11-20: qty 10

## 2013-11-20 MED ORDER — HEPARIN SOD (PORK) LOCK FLUSH 100 UNIT/ML IV SOLN
500.0000 [IU] | Freq: Once | INTRAVENOUS | Status: AC
  Administered 2013-11-20: 500 [IU] via INTRAVENOUS
  Filled 2013-11-20: qty 5

## 2013-11-20 NOTE — Patient Instructions (Signed)

## 2013-12-22 ENCOUNTER — Encounter (HOSPITAL_COMMUNITY): Payer: Self-pay

## 2013-12-22 ENCOUNTER — Other Ambulatory Visit (HOSPITAL_BASED_OUTPATIENT_CLINIC_OR_DEPARTMENT_OTHER): Payer: BC Managed Care – PPO

## 2013-12-22 ENCOUNTER — Ambulatory Visit (HOSPITAL_COMMUNITY)
Admission: RE | Admit: 2013-12-22 | Discharge: 2013-12-22 | Disposition: A | Discharge status: Home / Self Care | Payer: BC Managed Care – PPO | Source: Ambulatory Visit | Attending: Oncology | Admitting: Oncology

## 2013-12-22 DIAGNOSIS — C8588 Other specified types of non-Hodgkin lymphoma, lymph nodes of multiple sites: Secondary | ICD-10-CM

## 2013-12-22 DIAGNOSIS — K7689 Other specified diseases of liver: Secondary | ICD-10-CM | POA: Insufficient documentation

## 2013-12-22 DIAGNOSIS — C8299 Follicular lymphoma, unspecified, extranodal and solid organ sites: Secondary | ICD-10-CM

## 2013-12-22 DIAGNOSIS — D649 Anemia, unspecified: Secondary | ICD-10-CM

## 2013-12-22 DIAGNOSIS — Z951 Presence of aortocoronary bypass graft: Secondary | ICD-10-CM | POA: Insufficient documentation

## 2013-12-22 DIAGNOSIS — R109 Unspecified abdominal pain: Secondary | ICD-10-CM | POA: Insufficient documentation

## 2013-12-22 DIAGNOSIS — Z87898 Personal history of other specified conditions: Secondary | ICD-10-CM | POA: Insufficient documentation

## 2013-12-22 LAB — COMPREHENSIVE METABOLIC PANEL (CC13)
ALT: 14 U/L (ref 0–55)
AST: 21 U/L (ref 5–34)
Albumin: 3.9 g/dL (ref 3.5–5.0)
Alkaline Phosphatase: 125 U/L (ref 40–150)
Anion Gap: 14 mEq/L — ABNORMAL HIGH (ref 3–11)
BUN: 38.4 mg/dL — ABNORMAL HIGH (ref 7.0–26.0)
CO2: 26 mEq/L (ref 22–29)
Calcium: 9.8 mg/dL (ref 8.4–10.4)
Chloride: 104 mEq/L (ref 98–109)
Creatinine: 1.3 mg/dL (ref 0.7–1.3)
Glucose: 109 mg/dl (ref 70–140)
Potassium: 4.8 mEq/L (ref 3.5–5.1)
Sodium: 144 mEq/L (ref 136–145)
Total Bilirubin: 0.4 mg/dL (ref 0.20–1.20)
Total Protein: 7.3 g/dL (ref 6.4–8.3)

## 2013-12-22 LAB — CBC WITH DIFFERENTIAL/PLATELET
BASO%: 1.2 % (ref 0.0–2.0)
Basophils Absolute: 0.1 10*3/uL (ref 0.0–0.1)
EOS%: 5.7 % (ref 0.0–7.0)
Eosinophils Absolute: 0.5 10*3/uL (ref 0.0–0.5)
HCT: 37.4 % — ABNORMAL LOW (ref 38.4–49.9)
HGB: 12.1 g/dL — ABNORMAL LOW (ref 13.0–17.1)
LYMPH%: 9.9 % — ABNORMAL LOW (ref 14.0–49.0)
MCH: 26 pg — ABNORMAL LOW (ref 27.2–33.4)
MCHC: 32.2 g/dL (ref 32.0–36.0)
MCV: 80.7 fL (ref 79.3–98.0)
MONO#: 1.1 10*3/uL — ABNORMAL HIGH (ref 0.1–0.9)
MONO%: 11.9 % (ref 0.0–14.0)
NEUT#: 6.3 10*3/uL (ref 1.5–6.5)
NEUT%: 71.3 % (ref 39.0–75.0)
Platelets: 254 10*3/uL (ref 140–400)
RBC: 4.64 10*6/uL (ref 4.20–5.82)
RDW: 17.2 % — ABNORMAL HIGH (ref 11.0–14.6)
WBC: 8.9 10*3/uL (ref 4.0–10.3)
lymph#: 0.9 10*3/uL (ref 0.9–3.3)

## 2013-12-22 MED ORDER — IOHEXOL 300 MG/ML  SOLN
100.0000 mL | Freq: Once | INTRAMUSCULAR | Status: AC | PRN
  Administered 2013-12-22: 100 mL via INTRAVENOUS

## 2013-12-24 ENCOUNTER — Ambulatory Visit (HOSPITAL_BASED_OUTPATIENT_CLINIC_OR_DEPARTMENT_OTHER): Payer: BC Managed Care – PPO | Admitting: Oncology

## 2013-12-24 ENCOUNTER — Telehealth: Payer: Self-pay | Admitting: Oncology

## 2013-12-24 VITALS — BP 141/53 | HR 54 | Temp 97.4°F | Resp 17 | Ht 72.0 in | Wt 232.5 lb

## 2013-12-24 DIAGNOSIS — D649 Anemia, unspecified: Secondary | ICD-10-CM

## 2013-12-24 DIAGNOSIS — C8299 Follicular lymphoma, unspecified, extranodal and solid organ sites: Secondary | ICD-10-CM

## 2013-12-24 DIAGNOSIS — C8588 Other specified types of non-Hodgkin lymphoma, lymph nodes of multiple sites: Secondary | ICD-10-CM

## 2013-12-24 NOTE — Progress Notes (Signed)
Hematology and Oncology Follow Up Visit  John [name] [id number] [birth year] 63 y.o. 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   Prior Therapy: He is status post exploratory laparotomy and excision of an intra-abdominal mass and ileocolectomy in a Port-A-Cath placement on 02/09/2013 Chemotherapy with CHOP-R cycle one given on 04/16/2013. He is status post 4 cycles of chemotherapy completed in 06/2013.  Current therapy:  Observation and surveillance.  Interim History:  John [name] presents today for a followup visit with his wife. Since the last visit he has no new complaints. He completed chemotherapy in 06/2013 without any complications. He denies fevers but reports some mild occasional shortness of breath with activity but is able to perform his activities of daily living without difficulty. His LE swelling is improved. He denies any problems with nausea or vomiting. He did not report any lymphadenopathy or easy bruisability. Has not reported any petechiae or bleeding. Has not reported a decline his energy her performance status. He has reported some occasional right-sided hip pain but spontaneously resolves. He does report occasional discomfort associated with his Port-A-Cath.  Medications: I have reviewed the patient's current medications.  Current Outpatient Prescriptions  Medication Sig Dispense Refill  . amLODipine (NORVASC) 10 MG tablet Take 5 mg by mouth daily. Patient takes in the pm      . aspirin 81 MG tablet Take 81 mg by mouth daily.       . beta carotene w/minerals (OCUVITE) tablet Take 1 tablet by mouth daily.       . Clobetasol Prop Emollient Base (CLOBETASOL PROPIONATE E) 0.05 % emollient cream Apply 1  application topically 2 (two) times daily as needed. To affected areas      . digoxin (LANOXIN) 0.25 MG tablet Take 0.25 mg by mouth every morning.       . docusate sodium (COLACE) 100 MG capsule Take 100 mg by mouth daily.      Marland Kitchen esomeprazole (NEXIUM) 40 MG capsule Take 40 mg by mouth at bedtime.       Marland Kitchen ezetimibe-simvastatin (VYTORIN) 10-40 MG per tablet Take 1 tablet by mouth at bedtime.      . furosemide (LASIX) 40 MG tablet Take 40 mg by mouth daily.       Marland Kitchen glipiZIDE (GLUCOTROL XL) 10 MG 24 hr tablet Take 10 mg by mouth daily.       Marland Kitchen HYDROcodone-acetaminophen (NORCO) 5-325 MG per tablet Take 1 tablet by mouth every 6 (six) hours as needed for pain.  30 tablet  0  . insulin glargine (LANTUS) 100 UNIT/ML injection Inject 85 Units into the skin at bedtime.       . lidocaine-prilocaine (EMLA) cream Apply topically as needed.  30 g  0  . magnesium hydroxide (MILK OF MAGNESIA) 400 MG/5ML suspension Take 30 mLs by mouth daily as needed for constipation.  360 mL  0  . metFORMIN (GLUCOPHAGE) 1000 MG tablet Take 1,000 mg by mouth 2 (two) times daily.       . metoprolol (TOPROL-XL) 200 MG 24 hr tablet Take 200 mg by mouth daily after breakfast.       . Misc Natural Products (OSTEO BI-FLEX ADV TRIPLE ST) TABS Take 500 each by mouth as needed.       Marland Kitchen  nitroGLYCERIN (NITROSTAT) 0.4 MG SL tablet Place 0.4 mg under the tongue every 5 (five) minutes as needed for chest pain.      . Omega-3 Fatty Acids (FISH OIL) 1000 MG CAPS Take 1 capsule by mouth 2 (two) times daily.       . ondansetron (ZOFRAN) 8 MG tablet Take 1 tablet (8 mg total) by mouth every 8 (eight) hours as needed for nausea.  20 tablet  2  . potassium chloride SA (K-DUR,KLOR-CON) 20 MEQ tablet Take 10 mEq by mouth daily.       . ramipril (ALTACE) 5 MG capsule Take 5 mg by mouth 2 (two) times daily.       . sildenafil (VIAGRA) 100 MG tablet Take 100 mg by mouth as needed.      . sitaGLIPtin (JANUVIA) 100 MG tablet Take 100 mg by mouth daily.        Marland Kitchen warfarin (COUMADIN) 5 MG tablet Take 5-7.5 mg by mouth daily. Wed take 7.5 mg, and 5 mg on all other days       No current facility-administered medications for this visit.     Allergies: No Known Allergies  Past Medical History, Surgical history, Social history, and Family History were reviewed and updated.  Review of Systems:  Remaining ROS negative.  Physical Exam: Blood pressure 141/53, pulse 54, temperature 97.4 F (36.3 C), temperature source Oral, resp. rate 17, height 6' (1.829 m), weight 232 lb 8 oz (105.461 kg), SpO2 100.00%. ECOG: 1 General appearance: alert Head: Normocephalic, without obvious abnormality, atraumatic Neck: no adenopathy, no carotid bruit, no JVD, supple, symmetrical, trachea midline and thyroid not enlarged, symmetric, no tenderness/mass/nodules Lymph nodes: Cervical, supraclavicular, and axillary nodes normal. Heart:regular rate and rhythm, S1, S2 normal, no murmur, click, rub or gallop Lung:chest clear, no wheezing, rales, normal symmetric air entry Abdomen: soft, non-tender, without masses or organomegaly EXT:no erythema, induration, or nodules Abdominal wound completely healed. Incision a bit dry particularly centrally but no areas of skin breakdown.   Lab Results: Lab Results  Component Value Date   WBC 8.9 12/22/2013   HGB 12.1* 12/22/2013   HCT 37.4* 12/22/2013   MCV 80.7 12/22/2013   PLT 254 12/22/2013     Chemistry      Component Value Date/Time   NA 144 12/22/2013 0909   NA 142 02/19/2013 0529   K 4.8 12/22/2013 0909   K 3.2* 02/19/2013 0529   CL 107 02/19/2013 0529   CL 106 01/21/2013 1050   CO2 26 12/22/2013 0909   CO2 23 02/19/2013 0529   BUN 38.4* 12/22/2013 0909   BUN 12 02/19/2013 0529   CREATININE 1.3 12/22/2013 0909   CREATININE 0.77 02/19/2013 0529      Component Value Date/Time   CALCIUM 9.8 12/22/2013 0909   CALCIUM 8.0* 02/19/2013 0529   ALKPHOS 125 12/22/2013 0909   AST 21 12/22/2013 0909   ALT 14 12/22/2013 0909   BILITOT  0.40 12/22/2013 0909      CLINICAL DATA: Followup nodular lymphoma. Abdominal pain. Prior  surgery and chemotherapy.  EXAM:  CT CHEST, ABDOMEN, AND PELVIS WITH CONTRAST  TECHNIQUE:  Multidetector CT imaging of the chest, abdomen and pelvis was  performed following the standard protocol during bolus  administration of intravenous contrast.  CONTRAST: 127mL OMNIPAQUE IOHEXOL 300 MG/ML SOLN  COMPARISON: 07/07/2013  FINDINGS:  CT CHEST FINDINGS  No evidence of mediastinal or hilar lymphadenopathy. No  lymphadenopathy identified elsewhere within the thorax.  No evidence of pleural or  pericardial effusion. No suspicious  pulmonary nodules or masses are identified. No evidence of pulmonary  infiltrate or central endobronchial lesion. No suspicious bone  lesions identified. Prior CABG noted.  CT ABDOMEN AND PELVIS FINDINGS  Tiny subcapsular benign cyst or biliary hamartoma in the liver dome  is stable. No other liver masses are identified. The gallbladder,  pancreas, spleen, adrenal glands, and kidneys are normal in  appearance. No evidence of splenomegaly. No evidence hydronephrosis.  No lymphadenopathy identified within the abdomen or pelvis. No other  soft tissue masses seen. No evidence of inflammatory process or  abnormal fluid collections. No evidence of bowel obstruction. No  suspicious bone lesions identified.  IMPRESSION:  Stable exam. No evidence of recurrent lymphoma or other acute  findings.   Impression and Plan: 63 year old gentleman with the following issues:  1. 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 He is status post excisional biopsy and ileocolectomy which confirmed the presence of this lymphoma. CT scan results from 12/22/2013 were discussed and showed no evidence of disease relapse. The plan is to continue with observation and surveillance and repeat imaging studies in 8 months.   2. Anemia: Probably  multifactorial in nature likely related to chemotherapy and surgery. As a result of this time and his hemoglobin is near normal.  3. Abdominal wound: Completely healed at this time.   4. IV access: PAC in place. We have discussed removing his Port-A-Cath at this time and he is agreeable. We will ask Dr. Ninfa Linden to accommodate her move his Port-A-Cath in the future.  5. Followup: Will be in 4 months.      Wyatt Portela 4/16/20153:33 PM

## 2013-12-24 NOTE — Telephone Encounter (Signed)
gv adn printed appt sched and avs for opt for Aug

## 2013-12-25 ENCOUNTER — Telehealth: Payer: Self-pay | Admitting: *Deleted

## 2013-12-25 NOTE — Telephone Encounter (Addendum)
CALLED DR.BLACKMAN'S OFFICE AND LEFT A MESSAGE WITH THE TRIAGE NURSE CONCERNING THE REMOVAL OF PT.'S PORT. LEFT MESSAGE ON PT.'S HOME VOICE MAIL THAT DR.BLACKMAN'S OFFICE WILL CALL TO SCHEDULE REMOVAL OF PORT.

## 2014-01-15 ENCOUNTER — Ambulatory Visit (INDEPENDENT_AMBULATORY_CARE_PROVIDER_SITE_OTHER): Payer: BC Managed Care – PPO | Admitting: Surgery

## 2014-01-15 ENCOUNTER — Encounter (INDEPENDENT_AMBULATORY_CARE_PROVIDER_SITE_OTHER): Payer: Self-pay | Admitting: General Surgery

## 2014-01-15 ENCOUNTER — Encounter (INDEPENDENT_AMBULATORY_CARE_PROVIDER_SITE_OTHER): Payer: Self-pay | Admitting: Surgery

## 2014-01-15 VITALS — BP 129/71 | HR 76 | Temp 97.7°F | Resp 18 | Ht 72.0 in | Wt 232.6 lb

## 2014-01-15 DIAGNOSIS — Z9889 Other specified postprocedural states: Secondary | ICD-10-CM

## 2014-01-15 DIAGNOSIS — Z95828 Presence of other vascular implants and grafts: Secondary | ICD-10-CM

## 2014-01-15 DIAGNOSIS — K432 Incisional hernia without obstruction or gangrene: Secondary | ICD-10-CM

## 2014-01-15 NOTE — Progress Notes (Signed)
Subjective:     Patient ID: John [name], male   DOB: 1951/01/30, 63 y.o.   MRN: [id number]  HPI He is here for long-term followup visit. 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 He has completed chemotherapy and is disease-free. He has been doing well. He does complain of a bulge in his abdomen  Review of Systems     Objective:   Physical Exam On exam, he looks great. He does have a very large, reducible incisional hernia    Assessment:     Patient stable with a history of lymphoma with Port-A-Cath in place an incisional hernia     Plan:     I explained the diagnosis of incisional hernia with him. It is quite large I believe repair with mesh as needed. I could remove the port at the same time. He would require Lovenox bridging and coming off his Coumadin preoperatively. We will arrange this through his cardiologist. I plan to schedule her surgery hopefully on July 6

## 2014-01-26 ENCOUNTER — Other Ambulatory Visit (INDEPENDENT_AMBULATORY_CARE_PROVIDER_SITE_OTHER): Payer: Self-pay | Admitting: Surgery

## 2014-02-18 ENCOUNTER — Telehealth: Payer: Self-pay | Admitting: Cardiology

## 2014-02-18 NOTE — Telephone Encounter (Signed)
Is he cleared for surgery? Should whom ever follows his coumadin be who bridged him?

## 2014-02-18 NOTE — Telephone Encounter (Signed)
New message     Pt is having hernia surgery on July 6.  Pt needs to be cleared for surgery and need coumadin/lovenox bridge instructions.  Please call

## 2014-02-18 NOTE — Telephone Encounter (Signed)
The patient has a high functional level and had a low risk stress perfusion study a few years ago.  He is OK for the planned procedures from a surgical risk standpoint.  He will need Lovenox bridging.

## 2014-02-19 NOTE — Telephone Encounter (Signed)
Left message for Marcie Bal that pt has been cleared for surgery.  The MD or office monitoring his coumadin should do the bridging with Lovenox.  Requested she call back if questions or concerns.

## 2014-02-23 NOTE — Telephone Encounter (Signed)
Pts wife calling returning Pam's phone call from 6/12.  Told wife that Jeannene Patella is out of the office today, but her previous telephone note states that pt has been cleared for surgery, and the MD or office who is monitoring pts coumadin should do the bridging with Lovenox. Wife verbalized understanding and stated that pt will see the office who manages his coumadin on Friday, and she will endorse this note to them.

## 2014-02-23 NOTE — Telephone Encounter (Signed)
Follow up ° ° ° ° °Returning John [name]'s call °

## 2014-03-05 NOTE — Pre-Procedure Instructions (Signed)
John [name]  03/05/2014   Your procedure is scheduled on:  Mon, July 6 @ 7:30 AM  Report to [facility]  at 5:30 AM.  Call this number if you have problems the morning of surgery: [phone]   Remember:   Do not eat food or drink liquids after midnight.   Take these medicines the morning of surgery with A SIP OF WATER: Amlodipine(Norvasc),Digoxin(Lanoxin),Pain Pill(if needed), and Metoprolol(Toprol)               Stop taking your Fish Oil. Stop Aspirin as instructed as well as Coumadin. No Goody's,BC's,Aleve,Ibuprofen,or any Herbal Medications   Do not wear jewelry  Do not wear lotions, powders, or colognes. You may wear deodorant.  Men may shave face and neck.  Do not bring valuables to the hospital.  [facility] is not responsible                  for any belongings or valuables.               Contacts, dentures or bridgework may not be worn into surgery.  Leave suitcase in the car. After surgery it may be brought to your room.  For patients admitted to the hospital, discharge time is determined by your                treatment team.               Patients discharged the day of surgery will not be allowed to drive  home.    Special Instructions:  [facility] - Preparing for Surgery  Before surgery, you can play an important role.  Because skin is not sterile, your skin needs to be as free of germs as possible.  You can reduce the number of germs on you skin by washing with CHG (chlorahexidine gluconate) soap before surgery.  CHG is an antiseptic cleaner which kills germs and bonds with the skin to continue killing germs even after washing.  Please DO NOT use if you have an allergy to CHG or antibacterial soaps.  If your skin becomes reddened/irritated stop using the CHG and inform your nurse when you arrive at Short Stay.  Do not shave (including legs and underarms) for at least 48 hours prior to the first CHG shower.  You may shave your face.  Please follow these  instructions carefully:   1.  Shower with CHG Soap the night before surgery and the                                morning of Surgery.  2.  If you choose to wash your hair, wash your hair first as usual with your       normal shampoo.  3.  After you shampoo, rinse your hair and body thoroughly to remove the                      Shampoo.  4.  Use CHG as you would any other liquid soap.  You can apply chg directly       to the skin and wash gently with scrungie or a clean washcloth.  5.  Apply the CHG Soap to your body ONLY FROM THE NECK DOWN.        Do not use on open wounds or open sores.  Avoid contact with your eyes,  ears, mouth and genitals (private parts).  Wash genitals (private parts)       with your normal soap.  6.  Wash thoroughly, paying special attention to the area where your surgery        will be performed.  7.  Thoroughly rinse your body with warm water from the neck down.  8.  DO NOT shower/wash with your normal soap after using and rinsing off       the CHG Soap.  9.  Pat yourself dry with a clean towel.            10.  Wear clean pajamas.            11.  Place clean sheets on your bed the night of your first shower and do not        sleep with pets.  Day of Surgery  Do not apply any lotions/deoderants the morning of surgery.  Please wear clean clothes to the hospital/surgery center.     Please read over the following fact sheets that you were given: Pain Booklet, Coughing and Deep Breathing and Surgical Site Infection Prevention

## 2014-03-08 ENCOUNTER — Encounter (HOSPITAL_COMMUNITY): Payer: Self-pay

## 2014-03-08 ENCOUNTER — Encounter (HOSPITAL_COMMUNITY)
Admission: RE | Admit: 2014-03-08 | Discharge: 2014-03-08 | Disposition: A | Discharge status: Home / Self Care | Payer: BC Managed Care – PPO | Source: Ambulatory Visit | Attending: Surgery | Admitting: Surgery

## 2014-03-08 DIAGNOSIS — Z01812 Encounter for preprocedural laboratory examination: Secondary | ICD-10-CM | POA: Insufficient documentation

## 2014-03-08 HISTORY — DX: Edema, unspecified: R60.9

## 2014-03-08 HISTORY — DX: Nocturia: R35.1

## 2014-03-08 HISTORY — DX: Constipation, unspecified: K59.00

## 2014-03-08 HISTORY — DX: Influenza due to unidentified influenza virus with other respiratory manifestations: J11.1

## 2014-03-08 HISTORY — DX: Insomnia, unspecified: G47.00

## 2014-03-08 HISTORY — DX: Spontaneous ecchymoses: R23.3

## 2014-03-08 HISTORY — DX: Personal history of colonic polyps: Z86.010

## 2014-03-08 HISTORY — DX: Polyneuropathy, unspecified: G62.9

## 2014-03-08 HISTORY — DX: Personal history of colon polyps, unspecified: Z86.0100

## 2014-03-08 HISTORY — DX: Other skin changes: R23.8

## 2014-03-08 HISTORY — DX: Unspecified glaucoma: H40.9

## 2014-03-08 HISTORY — DX: Benign prostatic hyperplasia without lower urinary tract symptoms: N40.0

## 2014-03-08 HISTORY — DX: Localized edema: R60.0

## 2014-03-08 HISTORY — DX: Personal history of other medical treatment: Z92.89

## 2014-03-08 LAB — CBC
HCT: 37.6 % — ABNORMAL LOW (ref 39.0–52.0)
Hemoglobin: 11.8 g/dL — ABNORMAL LOW (ref 13.0–17.0)
MCH: 25.9 pg — ABNORMAL LOW (ref 26.0–34.0)
MCHC: 31.4 g/dL (ref 30.0–36.0)
MCV: 82.6 fL (ref 78.0–100.0)
Platelets: 220 10*3/uL (ref 150–400)
RBC: 4.55 MIL/uL (ref 4.22–5.81)
RDW: 16.1 % — ABNORMAL HIGH (ref 11.5–15.5)
WBC: 8.1 10*3/uL (ref 4.0–10.5)

## 2014-03-08 LAB — BASIC METABOLIC PANEL
BUN: 38 mg/dL — ABNORMAL HIGH (ref 6–23)
CO2: 23 mEq/L (ref 19–32)
Calcium: 9.3 mg/dL (ref 8.4–10.5)
Chloride: 102 mEq/L (ref 96–112)
Creatinine, Ser: 1.17 mg/dL (ref 0.50–1.35)
GFR calc Af Amer: 77 mL/min — ABNORMAL LOW (ref >90)
GFR calc non Af Amer: 66 mL/min — ABNORMAL LOW (ref >90)
Glucose, Bld: 157 mg/dL — ABNORMAL HIGH (ref 70–99)
Potassium: 5.2 mEq/L (ref 3.7–5.3)
Sodium: 141 mEq/L (ref 137–147)

## 2014-03-08 NOTE — Progress Notes (Addendum)
Dr.Hochrein is cardiologist with clearance in epic-will see him after this surgery  Echo in epic from 2011/2013  Stress test in epic from 2013  Heart cath in epic from 2002  EKG in epic from 07-13-13  CXR in epic from 2015  [place] is Dr.Leonard [employer]

## 2014-03-08 NOTE — Progress Notes (Signed)
Pt has been instructed by MD to stop Coumadin 03/09/14 then bridge with Lovenox starting on 03/10/14

## 2014-03-08 NOTE — Progress Notes (Signed)
Fasting blood sugar runs 130-140 

## 2014-03-08 NOTE — Progress Notes (Signed)
03/08/14 0912  OBSTRUCTIVE SLEEP APNEA  Have you ever been diagnosed with sleep apnea through a sleep study? No  Do you snore loudly (loud enough to be heard through closed doors)?  1  Do you often feel tired, fatigued, or sleepy during the daytime? 0  Has anyone observed you stop breathing during your sleep? 0  Do you have, or are you being treated for high blood pressure? 1  BMI more than 35 kg/m2? 0  Age over 61 years old? 1  Neck circumference greater than 40 cm/16 inches? 1 (17 1/2)  Gender: 1  Obstructive Sleep Apnea Score 5  Score 4 or greater  Results sent to PCP

## 2014-03-09 NOTE — Progress Notes (Signed)
Anesthesia Chart Review:  Patient is a 63 year old male scheduled for laparoscopic incisional hernia repair, Port-a-cath removal on 03/15/14 by Dr. Coralie Keens.  History includes former smoker, afib, rheumatic heart disease, CAD, s/p MV replacement (St. Jude mechanical) and CABG (LIMA to LAD, SVG to RCA) 11/2000, DM2, GERD, BPH, glaucoma, nephrolithiasis, HTN, HLD, peripheral edema, bruises easily, cancer (intra-abdominal large cell lymphoma, stage IIA) diagnosed 02/2013 s/p laparotomy with excison of mass and ileocolectomy and chemotherapy. OSA screening score was 5.  BMI is consistent with obesity.  PCP is Dr. Dione Housekeeper.  HEM-ONC is Dr. Osker Mason. Cardiologist is Dr. Percival Spanish who cleared him for surgery with Lovenox bridge while off Coumadin.  Cardiac MUGA scan on 01/27/13 showed: Minimally decreased left ventricular ejection fraction of 49%. Normal left ventricular wall motion.  Nuclear stress test on 07/31/12 showed: Overall Impression: Inferior/inferolateral scar/soft tissue attenuation with some inferior ischemia (mid). Overall; region ischemia is small, mild in severity.  LV Ejection Fraction: 44%. LV Wall Motion: Infero/inferoseptal hypokinensis. Results were felt similar to his previous study from 2012, so continued medical therapy was recommended.  Echo on 07/25/12 showed:  - Left ventricle: The cavity size was normal. Wall thickness was increased in a pattern of moderate LVH. Systolic function was normal. The estimated ejection fraction was in the range of 55% to 60%. Wall motion was normal; there were no regional wall motion abnormalities. - Aortic valve: Valve mobility was restricted. There was mild stenosis. Mild regurgitation. - Mitral valve: A mechanical prosthesis was present. - Left atrium: The atrium was severely dilated. - Right atrium: The atrium was mildly dilated. - Pulmonary arteries: Systolic pressure was mildly increased. PA peak pressure: 8mm Hg (S). Impressions:  Oscillating density in LV cavity most likely residual MV subvalvular apparatus following MVR.  EKG on 08/12/13 showed: Afib at 60 bpm, rightward axis, non-specific IVCD.  CXR on 10/09/13 showed: Postsurgical changes are seen. Cardiac shadow is within normal limits. The lungs are well aerated bilaterally. A left chest wall port is again seen and stable. No acute bony abnormality is noted. IMPRESSION: No change from the prior exam. No acute abnormality seen.   Preoperative labs noted.  BUN elevated at 38, but unchanged or less than his previous values since 07/09/13.  Cr 1.17.  H/H 11.8/37.6.  Glucose 157.  He will need a PT/PTT on the day of surgery.   If same day labs are acceptable and otherwise no acute changes then I would anticipate that he could proceed as planned.  George Hugh [facility]/Anesthesiology Phone [phone] 03/09/2014 12:51 PM

## 2014-03-11 NOTE — H&P (Signed)
John [name] is an 63 y.o. male.   Chief Complaint: incisional hernia and Port-A-Cath in place HPI: this is a pleasant gentleman who is one year out status post exploratory laparotomy for a large intra-abdominal tumor. This ended up being lymphoma. He has now finished chemotherapy. He has a symptomatic incisional hernia which he would like to have repaired and also wished to have his Port-A-Cath removed. He is currently doing well and is without complaints.  Past Medical History  Diagnosis Date  . HLD (hyperlipidemia)     takes Vytorin daily  . Rheumatic fever     as a child  . CAD (coronary artery disease)     a. s/p CABG 11/2000 (L-LAD, S-RCA);  b. Lexiscan Myoview 11/12: EF 45%, ischemia and possibly some scar at the base of the inferolateral wall;   c. Lex MV 11/13:  EF 44%, Inf and IL scar/soft tissue atten, small mild amt of inf ischemia,  . Atrial fibrillation     takes Coumadin daily  . Rheumatic heart disease     a. s/p mechanical (St. Jude) MVR 2002;  b. Echo 5/11: Mild LVH, EF 45-50%, mild AS, mild AI, mean gradient 11 mmHg, MVR with normal gradients, moderate LAE, mild RAE;  c.  Echo 11/13:   mod LVH, EF 55-60%, mild AS (mean 18 mmHg), mild AI, severe LAE, mild RAE, PASP 41  . 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 Anemia   . Arthritis     back   . HTN (hypertension)     takes Amlodipine,Metoprolol, and Ramipril daily  . DM (diabetes mellitus)     takes Januvia,Glipizide,and Metformin daily as well as Lantus  . Constipation     takes Colace daily  . Rheumatic fever     as a child  . Peripheral edema     takes Furosemide daily  . SOB (shortness of breath)     with exertion  . Pneumonia 09/2013  . Flu 09/2013  . Peripheral neuropathy   . 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 Bruises easily     takes Coumadin daily  . GERD (gastroesophageal reflux disease)     takes Nexium daily  . History of colon polyps   . Nocturia   . Enlarged prostate   . History of kidney  stones   . History of blood transfusion     no abnormal reaction noted  . Glaucoma   . Insomnia     states he has always been like this.But doesn't take any meds    Past Surgical History  Procedure Laterality Date  . Mitral valve replacement      #33 St. Jude mechanical mitral valve prosthesis. Dr. Servando Snare  . Laparotomy N/A 02/09/2013    Procedure: EXPLORATORY LAPAROTOMY WITH incisional biopsy intra- ABDOMINAL MASS ILOCECTOMY ;  Surgeon: Harl Bowie, MD;  Location: [facility];  Service: General;  Laterality: N/A;  . Portacath placement N/A 02/09/2013    Procedure: INSERTION PORT-A-CATH;  Surgeon: Harl Bowie, MD;  Location: [facility];  Service: General;  Laterality: N/A;  . Coronary artery bypass graft  2002    x 3  . Septoplasty    . Cardiac catheterization    . Wound vac placed  2014  . Colonoscopy    . Esophagogastroduodenoscopy      Family History  Problem Relation Age of Onset  . Heart disease Neg Hx     early; 1st degree relatives   .  Diabetes Mother   . Hypertension Father   . Thyroid disease Sister   . Diabetes Brother   . Hypertension Brother   . Diabetes Brother    Social History:  reports that he has quit smoking. His smoking use included Cigars. He does not have any smokeless tobacco history on file. He reports that he does not drink alcohol or use illicit drugs.  Allergies: No Known Allergies  No prescriptions prior to admission    No results found for this or any previous visit (from the past 48 hour(s)). No results found.  Review of Systems  All other systems reviewed and are negative.   There were no vitals taken for this visit. Physical Exam  Constitutional: He is oriented to person, place, and time. He appears well-developed and well-nourished. No distress.  HENT:  Head: Normocephalic and atraumatic.  Eyes: Conjunctivae are normal. Pupils are equal, round, and reactive to light.  Neck: Normal range of motion. Neck supple. No tracheal  deviation present.  Cardiovascular: Normal rate, regular rhythm and normal heart sounds.   Respiratory: Effort normal and breath sounds normal. No respiratory distress.  GI: Soft. There is no tenderness. There is no guarding.  Large incisional hernia whicht is reducible  Musculoskeletal: Normal range of motion.  Neurological: He is alert and oriented to person, place, and time.  Skin: Skin is warm and dry. No rash noted. No erythema.  Psychiatric: His behavior is normal. Judgment normal.     Assessment/Plan Incisional hernia and history of lymphoma  The plan will be to proceed with a laparoscopic possible open incisional hernia repair with mesh as well as Port-A-Cath removal. I discussed the risk of surgery with him. These risks include but are not limited to bleeding, infection, need for conversion to an open procedure, injury to surrounding structures including the intestines, cardiopulmonary issues, DVT, etc. He understands and wishes to proceed. Surgery is scheduled  Korri Ask A 03/11/2014, 1:45 PM

## 2014-03-14 MED ORDER — CEFAZOLIN SODIUM-DEXTROSE 2-3 GM-% IV SOLR
2.0000 g | INTRAVENOUS | Status: AC
  Administered 2014-03-15: 2 g via INTRAVENOUS
  Filled 2014-03-14: qty 50

## 2014-03-15 ENCOUNTER — Encounter (HOSPITAL_COMMUNITY): Payer: BC Managed Care – PPO | Admitting: Vascular Surgery

## 2014-03-15 ENCOUNTER — Encounter (HOSPITAL_COMMUNITY)
Admission: RE | Disposition: A | Discharge status: Home / Self Care | Payer: Self-pay | Source: Ambulatory Visit | Attending: Surgery

## 2014-03-15 ENCOUNTER — Encounter (HOSPITAL_COMMUNITY): Payer: Self-pay | Admitting: *Deleted

## 2014-03-15 ENCOUNTER — Ambulatory Visit (HOSPITAL_COMMUNITY): Payer: BC Managed Care – PPO | Admitting: Certified Registered Nurse Anesthetist

## 2014-03-15 ENCOUNTER — Observation Stay (HOSPITAL_COMMUNITY)
Admission: RE | Admit: 2014-03-15 | Discharge: 2014-03-17 | Disposition: A | Discharge status: Home / Self Care | Payer: BC Managed Care – PPO | Source: Ambulatory Visit | Attending: Surgery | Admitting: Surgery

## 2014-03-15 DIAGNOSIS — G4700 Insomnia, unspecified: Secondary | ICD-10-CM | POA: Insufficient documentation

## 2014-03-15 DIAGNOSIS — E119 Type 2 diabetes mellitus without complications: Secondary | ICD-10-CM | POA: Insufficient documentation

## 2014-03-15 DIAGNOSIS — Z794 Long term (current) use of insulin: Secondary | ICD-10-CM | POA: Insufficient documentation

## 2014-03-15 DIAGNOSIS — E785 Hyperlipidemia, unspecified: Secondary | ICD-10-CM | POA: Insufficient documentation

## 2014-03-15 DIAGNOSIS — I4891 Unspecified atrial fibrillation: Secondary | ICD-10-CM | POA: Insufficient documentation

## 2014-03-15 DIAGNOSIS — Z7901 Long term (current) use of anticoagulants: Secondary | ICD-10-CM | POA: Insufficient documentation

## 2014-03-15 DIAGNOSIS — R609 Edema, unspecified: Secondary | ICD-10-CM | POA: Insufficient documentation

## 2014-03-15 DIAGNOSIS — K219 Gastro-esophageal reflux disease without esophagitis: Secondary | ICD-10-CM | POA: Insufficient documentation

## 2014-03-15 DIAGNOSIS — Z23 Encounter for immunization: Secondary | ICD-10-CM | POA: Insufficient documentation

## 2014-03-15 DIAGNOSIS — Z87891 Personal history of nicotine dependence: Secondary | ICD-10-CM | POA: Insufficient documentation

## 2014-03-15 DIAGNOSIS — Z954 Presence of other heart-valve replacement: Secondary | ICD-10-CM | POA: Insufficient documentation

## 2014-03-15 DIAGNOSIS — C8583 Other specified types of non-Hodgkin lymphoma, intra-abdominal lymph nodes: Secondary | ICD-10-CM | POA: Insufficient documentation

## 2014-03-15 DIAGNOSIS — I2510 Atherosclerotic heart disease of native coronary artery without angina pectoris: Secondary | ICD-10-CM | POA: Insufficient documentation

## 2014-03-15 DIAGNOSIS — K432 Incisional hernia without obstruction or gangrene: Secondary | ICD-10-CM

## 2014-03-15 DIAGNOSIS — H409 Unspecified glaucoma: Secondary | ICD-10-CM | POA: Insufficient documentation

## 2014-03-15 DIAGNOSIS — Z452 Encounter for adjustment and management of vascular access device: Secondary | ICD-10-CM

## 2014-03-15 DIAGNOSIS — N401 Enlarged prostate with lower urinary tract symptoms: Secondary | ICD-10-CM | POA: Insufficient documentation

## 2014-03-15 DIAGNOSIS — N138 Other obstructive and reflux uropathy: Secondary | ICD-10-CM | POA: Insufficient documentation

## 2014-03-15 DIAGNOSIS — G609 Hereditary and idiopathic neuropathy, unspecified: Secondary | ICD-10-CM | POA: Insufficient documentation

## 2014-03-15 DIAGNOSIS — I099 Rheumatic heart disease, unspecified: Secondary | ICD-10-CM | POA: Insufficient documentation

## 2014-03-15 DIAGNOSIS — R351 Nocturia: Secondary | ICD-10-CM | POA: Insufficient documentation

## 2014-03-15 DIAGNOSIS — Z9221 Personal history of antineoplastic chemotherapy: Secondary | ICD-10-CM | POA: Insufficient documentation

## 2014-03-15 DIAGNOSIS — I10 Essential (primary) hypertension: Secondary | ICD-10-CM | POA: Insufficient documentation

## 2014-03-15 DIAGNOSIS — I9589 Other hypotension: Secondary | ICD-10-CM | POA: Insufficient documentation

## 2014-03-15 DIAGNOSIS — Z6831 Body mass index (BMI) 31.0-31.9, adult: Secondary | ICD-10-CM | POA: Insufficient documentation

## 2014-03-15 HISTORY — DX: Type 2 diabetes mellitus without complications: E11.9

## 2014-03-15 HISTORY — PX: LAPAROSCOPIC INCISIONAL / UMBILICAL / VENTRAL HERNIA REPAIR: SUR789

## 2014-03-15 HISTORY — PX: INCISIONAL HERNIA REPAIR: SHX193

## 2014-03-15 HISTORY — PX: INSERTION OF MESH: SHX5868

## 2014-03-15 HISTORY — DX: Malignant neoplasm of small intestine, unspecified: C17.9

## 2014-03-15 HISTORY — PX: PORT-A-CATH REMOVAL: SHX5289

## 2014-03-15 LAB — GLUCOSE, CAPILLARY
Glucose-Capillary: 152 mg/dL — ABNORMAL HIGH (ref 70–99)
Glucose-Capillary: 165 mg/dL — ABNORMAL HIGH (ref 70–99)
Glucose-Capillary: 206 mg/dL — ABNORMAL HIGH (ref 70–99)
Glucose-Capillary: 223 mg/dL — ABNORMAL HIGH (ref 70–99)
Glucose-Capillary: 226 mg/dL — ABNORMAL HIGH (ref 70–99)
Glucose-Capillary: 240 mg/dL — ABNORMAL HIGH (ref 70–99)

## 2014-03-15 LAB — APTT: aPTT: 33 s (ref 24–37)

## 2014-03-15 LAB — PROTIME-INR
INR: 1.24 (ref 0.00–1.49)
Prothrombin Time: 15.6 seconds — ABNORMAL HIGH (ref 11.6–15.2)

## 2014-03-15 SURGERY — REPAIR, HERNIA, INCISIONAL, LAPAROSCOPIC
Anesthesia: General | Site: Chest

## 2014-03-15 MED ORDER — METFORMIN HCL 500 MG PO TABS: 1000.0000 mg | ORAL_TABLET | Freq: Two times a day (BID) | ORAL | Status: DC

## 2014-03-15 MED ORDER — FENTANYL CITRATE 0.05 MG/ML IJ SOLN
INTRAMUSCULAR | Status: DC | PRN
  Administered 2014-03-15 (×2): 50 ug via INTRAVENOUS
  Administered 2014-03-15: 150 ug via INTRAVENOUS

## 2014-03-15 MED ORDER — METOPROLOL SUCCINATE ER 100 MG PO TB24: 200.0000 mg | ORAL_TABLET | Freq: Every day | ORAL | Status: DC

## 2014-03-15 MED ORDER — DIGOXIN 250 MCG PO TABS
0.2500 mg | ORAL_TABLET | Freq: Every morning | ORAL | Status: DC
  Administered 2014-03-16 – 2014-03-17 (×2): 0.25 mg via ORAL
  Filled 2014-03-15 (×2): qty 1

## 2014-03-15 MED ORDER — RAMIPRIL 10 MG PO CAPS
10.0000 mg | ORAL_CAPSULE | Freq: Two times a day (BID) | ORAL | Status: DC
  Administered 2014-03-15 – 2014-03-17 (×5): 10 mg via ORAL
  Filled 2014-03-15 (×6): qty 1

## 2014-03-15 MED ORDER — WARFARIN SODIUM 10 MG PO TABS: 10.0000 mg | ORAL_TABLET | Freq: Once | ORAL | Status: DC

## 2014-03-15 MED ORDER — ONDANSETRON HCL 4 MG/2ML IJ SOLN
4.0000 mg | Freq: Four times a day (QID) | INTRAMUSCULAR | Status: DC | PRN
Start: 2014-03-15 — End: 2014-03-17
  Administered 2014-03-15 – 2014-03-16 (×2): 4 mg via INTRAVENOUS
  Filled 2014-03-15 (×2): qty 2

## 2014-03-15 MED ORDER — PROPOFOL 10 MG/ML IV BOLUS
INTRAVENOUS | Status: AC
  Filled 2014-03-15: qty 20

## 2014-03-15 MED ORDER — NAPROXEN 250 MG PO TABS
500.0000 mg | ORAL_TABLET | Freq: Three times a day (TID) | ORAL | Status: DC | PRN
  Administered 2014-03-16: 500 mg via ORAL
  Filled 2014-03-15: qty 2

## 2014-03-15 MED ORDER — FENTANYL CITRATE 0.05 MG/ML IJ SOLN
INTRAMUSCULAR | Status: AC
  Filled 2014-03-15: qty 5

## 2014-03-15 MED ORDER — HYDROMORPHONE HCL PF 1 MG/ML IJ SOLN
INTRAMUSCULAR | Status: AC
  Filled 2014-03-15: qty 1

## 2014-03-15 MED ORDER — SUCCINYLCHOLINE CHLORIDE 20 MG/ML IJ SOLN
INTRAMUSCULAR | Status: AC
  Filled 2014-03-15: qty 1

## 2014-03-15 MED ORDER — MORPHINE SULFATE 2 MG/ML IJ SOLN: 1.0000 mg | INTRAMUSCULAR | Status: DC | PRN

## 2014-03-15 MED ORDER — PANTOPRAZOLE SODIUM 40 MG PO TBEC
40.0000 mg | DELAYED_RELEASE_TABLET | Freq: Every day | ORAL | Status: DC
Start: 2014-03-15 — End: 2014-03-17
  Administered 2014-03-15 – 2014-03-17 (×3): 40 mg via ORAL
  Filled 2014-03-15 (×3): qty 1

## 2014-03-15 MED ORDER — ONDANSETRON HCL 4 MG PO TABS: 4.0000 mg | ORAL_TABLET | Freq: Four times a day (QID) | ORAL | Status: DC | PRN

## 2014-03-15 MED ORDER — EZETIMIBE-SIMVASTATIN 10-40 MG PO TABS
1.0000 | ORAL_TABLET | Freq: Every day | ORAL | Status: DC
  Administered 2014-03-15 – 2014-03-16 (×2): 1 via ORAL
  Filled 2014-03-15 (×3): qty 1

## 2014-03-15 MED ORDER — INSULIN ASPART 100 UNIT/ML ~~LOC~~ SOLN
0.0000 [IU] | SUBCUTANEOUS | Status: DC
  Administered 2014-03-15: 5 [IU] via SUBCUTANEOUS
  Administered 2014-03-15: 3 [IU] via SUBCUTANEOUS
  Administered 2014-03-15 (×2): 5 [IU] via SUBCUTANEOUS
  Administered 2014-03-16: 11 [IU] via SUBCUTANEOUS
  Administered 2014-03-16: 8 [IU] via SUBCUTANEOUS
  Administered 2014-03-16 (×2): 5 [IU] via SUBCUTANEOUS

## 2014-03-15 MED ORDER — ROCURONIUM BROMIDE 50 MG/5ML IV SOLN
INTRAVENOUS | Status: AC
  Filled 2014-03-15: qty 1

## 2014-03-15 MED ORDER — EPHEDRINE SULFATE 50 MG/ML IJ SOLN
INTRAMUSCULAR | Status: DC | PRN
  Administered 2014-03-15 (×3): 10 mg via INTRAVENOUS

## 2014-03-15 MED ORDER — POTASSIUM CHLORIDE IN NACL 20-0.9 MEQ/L-% IV SOLN
INTRAVENOUS | Status: DC
  Administered 2014-03-15 – 2014-03-16 (×2): via INTRAVENOUS
  Filled 2014-03-15 (×4): qty 1000

## 2014-03-15 MED ORDER — ASPIRIN 81 MG PO CHEW
81.0000 mg | CHEWABLE_TABLET | Freq: Every day | ORAL | Status: DC
  Administered 2014-03-15 – 2014-03-17 (×3): 81 mg via ORAL
  Filled 2014-03-15 (×3): qty 1

## 2014-03-15 MED ORDER — ONDANSETRON HCL 4 MG/2ML IJ SOLN: 4.0000 mg | Freq: Once | INTRAMUSCULAR | Status: DC | PRN

## 2014-03-15 MED ORDER — MIDAZOLAM HCL 2 MG/2ML IJ SOLN
INTRAMUSCULAR | Status: AC
  Filled 2014-03-15: qty 2

## 2014-03-15 MED ORDER — NEOSTIGMINE METHYLSULFATE 10 MG/10ML IV SOLN
INTRAVENOUS | Status: DC | PRN
  Administered 2014-03-15: 3 mg via INTRAVENOUS

## 2014-03-15 MED ORDER — BUPIVACAINE-EPINEPHRINE (PF) 0.25% -1:200000 IJ SOLN
INTRAMUSCULAR | Status: AC
  Filled 2014-03-15: qty 30

## 2014-03-15 MED ORDER — LINAGLIPTIN 5 MG PO TABS
5.0000 mg | ORAL_TABLET | Freq: Every day | ORAL | Status: DC
  Administered 2014-03-15 – 2014-03-17 (×3): 5 mg via ORAL
  Filled 2014-03-15 (×4): qty 1

## 2014-03-15 MED ORDER — HYDROMORPHONE HCL PF 1 MG/ML IJ SOLN
1.0000 mg | INTRAMUSCULAR | Status: DC | PRN
  Administered 2014-03-15 – 2014-03-16 (×7): 1 mg via INTRAVENOUS
  Filled 2014-03-15 (×8): qty 1

## 2014-03-15 MED ORDER — ONDANSETRON HCL 4 MG/2ML IJ SOLN
INTRAMUSCULAR | Status: DC | PRN
  Administered 2014-03-15: 4 mg via INTRAVENOUS

## 2014-03-15 MED ORDER — ENOXAPARIN SODIUM 100 MG/ML ~~LOC~~ SOLN
100.0000 mg | Freq: Two times a day (BID) | SUBCUTANEOUS | Status: DC
  Administered 2014-03-15 – 2014-03-17 (×4): 100 mg via SUBCUTANEOUS
  Filled 2014-03-15 (×5): qty 1

## 2014-03-15 MED ORDER — METFORMIN HCL 500 MG PO TABS
1000.0000 mg | ORAL_TABLET | Freq: Two times a day (BID) | ORAL | Status: DC
  Administered 2014-03-15 – 2014-03-16 (×2): 1000 mg via ORAL
  Filled 2014-03-15 (×4): qty 2

## 2014-03-15 MED ORDER — GLYCOPYRROLATE 0.2 MG/ML IJ SOLN
INTRAMUSCULAR | Status: AC
  Filled 2014-03-15: qty 3

## 2014-03-15 MED ORDER — FUROSEMIDE 40 MG PO TABS
40.0000 mg | ORAL_TABLET | Freq: Every day | ORAL | Status: DC
  Administered 2014-03-15 – 2014-03-17 (×3): 40 mg via ORAL
  Filled 2014-03-15 (×3): qty 1

## 2014-03-15 MED ORDER — ROCURONIUM BROMIDE 100 MG/10ML IV SOLN
INTRAVENOUS | Status: DC | PRN
  Administered 2014-03-15: 50 mg via INTRAVENOUS

## 2014-03-15 MED ORDER — ONDANSETRON HCL 4 MG/2ML IJ SOLN
INTRAMUSCULAR | Status: AC
  Filled 2014-03-15: qty 2

## 2014-03-15 MED ORDER — BUPIVACAINE-EPINEPHRINE 0.25% -1:200000 IJ SOLN
INTRAMUSCULAR | Status: DC | PRN
  Administered 2014-03-15: 26 mL

## 2014-03-15 MED ORDER — HYDROCODONE-ACETAMINOPHEN 5-325 MG PO TABS
1.0000 | ORAL_TABLET | ORAL | Status: DC | PRN
  Administered 2014-03-15 – 2014-03-16 (×2): 2 via ORAL
  Filled 2014-03-15 (×3): qty 2

## 2014-03-15 MED ORDER — PHENYLEPHRINE 40 MCG/ML (10ML) SYRINGE FOR IV PUSH (FOR BLOOD PRESSURE SUPPORT)
PREFILLED_SYRINGE | INTRAVENOUS | Status: AC
  Filled 2014-03-15: qty 10

## 2014-03-15 MED ORDER — AMLODIPINE BESYLATE 5 MG PO TABS
5.0000 mg | ORAL_TABLET | Freq: Every day | ORAL | Status: DC
  Administered 2014-03-16 – 2014-03-17 (×2): 5 mg via ORAL
  Filled 2014-03-15 (×2): qty 1

## 2014-03-15 MED ORDER — GLYCOPYRROLATE 0.2 MG/ML IJ SOLN
INTRAMUSCULAR | Status: AC
  Filled 2014-03-15: qty 1

## 2014-03-15 MED ORDER — METOPROLOL SUCCINATE ER 100 MG PO TB24
200.0000 mg | ORAL_TABLET | Freq: Every day | ORAL | Status: DC
  Administered 2014-03-16 – 2014-03-17 (×2): 200 mg via ORAL
  Filled 2014-03-15 (×2): qty 2

## 2014-03-15 MED ORDER — NEOSTIGMINE METHYLSULFATE 10 MG/10ML IV SOLN
INTRAVENOUS | Status: AC
  Filled 2014-03-15: qty 1

## 2014-03-15 MED ORDER — LACTATED RINGERS IV SOLN
INTRAVENOUS | Status: DC | PRN
Start: 2014-03-15 — End: 2014-03-15
  Administered 2014-03-15 (×2): via INTRAVENOUS

## 2014-03-15 MED ORDER — PROPOFOL 10 MG/ML IV BOLUS
INTRAVENOUS | Status: DC | PRN
  Administered 2014-03-15: 200 mg via INTRAVENOUS

## 2014-03-15 MED ORDER — PNEUMOCOCCAL VAC POLYVALENT 25 MCG/0.5ML IJ INJ
0.5000 mL | INJECTION | INTRAMUSCULAR | Status: AC
  Administered 2014-03-16: 0.5 mL via INTRAMUSCULAR
  Filled 2014-03-15 (×2): qty 0.5

## 2014-03-15 MED ORDER — INSULIN GLARGINE 100 UNIT/ML ~~LOC~~ SOLN
75.0000 [IU] | Freq: Two times a day (BID) | SUBCUTANEOUS | Status: DC
  Administered 2014-03-15 – 2014-03-17 (×4): 75 [IU] via SUBCUTANEOUS
  Filled 2014-03-15 (×5): qty 0.75

## 2014-03-15 MED ORDER — GLYCOPYRROLATE 0.2 MG/ML IJ SOLN
INTRAMUSCULAR | Status: DC | PRN
  Administered 2014-03-15: 0.2 mg via INTRAVENOUS
  Administered 2014-03-15: 0.6 mg via INTRAVENOUS

## 2014-03-15 MED ORDER — HYDROMORPHONE HCL PF 1 MG/ML IJ SOLN
0.2500 mg | INTRAMUSCULAR | Status: DC | PRN
  Administered 2014-03-15 (×2): 0.5 mg via INTRAVENOUS

## 2014-03-15 MED ORDER — LIDOCAINE HCL (CARDIAC) 20 MG/ML IV SOLN
INTRAVENOUS | Status: AC
  Filled 2014-03-15: qty 5

## 2014-03-15 MED ORDER — GLIPIZIDE ER 10 MG PO TB24
10.0000 mg | ORAL_TABLET | Freq: Every day | ORAL | Status: DC
  Administered 2014-03-15 – 2014-03-17 (×3): 10 mg via ORAL
  Filled 2014-03-15 (×3): qty 1

## 2014-03-15 MED ORDER — EPHEDRINE SULFATE 50 MG/ML IJ SOLN
INTRAMUSCULAR | Status: AC
  Filled 2014-03-15: qty 1

## 2014-03-15 MED ORDER — 0.9 % SODIUM CHLORIDE (POUR BTL) OPTIME
TOPICAL | Status: DC | PRN
  Administered 2014-03-15: 1000 mL

## 2014-03-15 MED ORDER — LIDOCAINE HCL (CARDIAC) 20 MG/ML IV SOLN
INTRAVENOUS | Status: DC | PRN
Start: 2014-03-15 — End: 2014-03-15
  Administered 2014-03-15: 80 mg via INTRAVENOUS

## 2014-03-15 MED ORDER — SODIUM CHLORIDE 0.9 % IJ SOLN
INTRAMUSCULAR | Status: AC
  Filled 2014-03-15: qty 10

## 2014-03-15 MED ORDER — MIDAZOLAM HCL 5 MG/5ML IJ SOLN
INTRAMUSCULAR | Status: DC | PRN
  Administered 2014-03-15: 2 mg via INTRAVENOUS

## 2014-03-15 SURGICAL SUPPLY — 74 items
APL SKNCLS STERI-STRIP NONHPOA (GAUZE/BANDAGES/DRESSINGS) ×4
APPLIER CLIP 5 13 M/L LIGAMAX5 (MISCELLANEOUS)
APPLIER CLIP ROT 10 11.4 M/L (STAPLE)
APR CLP MED LRG 11.4X10 (STAPLE)
APR CLP MED LRG 5 ANG JAW (MISCELLANEOUS)
BANDAGE ADH SHEER 1  50/CT (GAUZE/BANDAGES/DRESSINGS) ×14 IMPLANT
BENZOIN TINCTURE PRP APPL 2/3 (GAUZE/BANDAGES/DRESSINGS) ×6 IMPLANT
BINDER ABD UNIV 10 28-50 (GAUZE/BANDAGES/DRESSINGS) IMPLANT
BINDER ABDOM UNIV 10 (GAUZE/BANDAGES/DRESSINGS) ×4
BLADE SURG 15 STRL LF DISP TIS (BLADE) IMPLANT
BLADE SURG 15 STRL SS (BLADE) ×4
BLADE SURG ROTATE 9660 (MISCELLANEOUS) ×2 IMPLANT
BNDG ADH 5X2 AIR PERM ELC (GAUZE/BANDAGES/DRESSINGS) ×2
BNDG COHESIVE 2X5 WHT NS (GAUZE/BANDAGES/DRESSINGS) ×2 IMPLANT
CANISTER SUCTION 2500CC (MISCELLANEOUS) IMPLANT
CHLORAPREP W/TINT 10.5 ML (MISCELLANEOUS) ×4 IMPLANT
CHLORAPREP W/TINT 26ML (MISCELLANEOUS) ×4 IMPLANT
CLIP APPLIE 5 13 M/L LIGAMAX5 (MISCELLANEOUS) IMPLANT
CLIP APPLIE ROT 10 11.4 M/L (STAPLE) IMPLANT
CLOSURE WOUND 1/2 X4 (GAUZE/BANDAGES/DRESSINGS) ×1
COVER SURGICAL LIGHT HANDLE (MISCELLANEOUS) ×4 IMPLANT
DECANTER SPIKE VIAL GLASS SM (MISCELLANEOUS) ×2 IMPLANT
DEVICE SECURE STRAP 25 ABSORB (INSTRUMENTS) ×8 IMPLANT
DEVICE TROCAR PUNCTURE CLOSURE (ENDOMECHANICALS) ×4 IMPLANT
DRAPE PED LAPAROTOMY (DRAPES) ×2 IMPLANT
DRAPE PROXIMA HALF (DRAPES) ×2 IMPLANT
DRAPE UTILITY 15X26 W/TAPE STR (DRAPE) ×8 IMPLANT
DRSG TEGADERM 4X4.75 (GAUZE/BANDAGES/DRESSINGS) ×4 IMPLANT
ELECT CAUTERY BLADE 6.4 (BLADE) ×4 IMPLANT
ELECT REM PT RETURN 9FT ADLT (ELECTROSURGICAL) ×4
ELECTRODE REM PT RTRN 9FT ADLT (ELECTROSURGICAL) ×2 IMPLANT
GAUZE SPONGE 2X2 8PLY STRL LF (GAUZE/BANDAGES/DRESSINGS) ×2 IMPLANT
GAUZE SPONGE 4X4 16PLY XRAY LF (GAUZE/BANDAGES/DRESSINGS) ×2 IMPLANT
GLOVE BIO SURGEON STRL SZ 6.5 (GLOVE) ×1 IMPLANT
GLOVE BIO SURGEON STRL SZ7 (GLOVE) ×2 IMPLANT
GLOVE BIO SURGEONS STRL SZ 6.5 (GLOVE) ×1
GLOVE BIOGEL PI IND STRL 7.0 (GLOVE) IMPLANT
GLOVE BIOGEL PI INDICATOR 7.0 (GLOVE) ×10
GLOVE SURG SIGNA 7.5 PF LTX (GLOVE) ×6 IMPLANT
GLOVE SURG SS PI 7.0 STRL IVOR (GLOVE) ×2 IMPLANT
GOWN STRL REUS W/ TWL LRG LVL3 (GOWN DISPOSABLE) ×4 IMPLANT
GOWN STRL REUS W/ TWL XL LVL3 (GOWN DISPOSABLE) ×2 IMPLANT
GOWN STRL REUS W/TWL LRG LVL3 (GOWN DISPOSABLE) ×12
GOWN STRL REUS W/TWL XL LVL3 (GOWN DISPOSABLE) ×4
KIT BASIN OR (CUSTOM PROCEDURE TRAY) ×4 IMPLANT
KIT ROOM TURNOVER OR (KITS) ×4 IMPLANT
MARKER SKIN DUAL TIP RULER LAB (MISCELLANEOUS) ×4 IMPLANT
MESH VENTRALIGHT ST 10X13IN (Mesh General) ×2 IMPLANT
NDL HYPO 25GX1X1/2 BEV (NEEDLE) ×2 IMPLANT
NDL SPNL 22GX3.5 QUINCKE BK (NEEDLE) ×2 IMPLANT
NEEDLE HYPO 25GX1X1/2 BEV (NEEDLE) ×4 IMPLANT
NEEDLE SPNL 22GX3.5 QUINCKE BK (NEEDLE) ×4 IMPLANT
NS IRRIG 1000ML POUR BTL (IV SOLUTION) ×4 IMPLANT
PACK SURGICAL SETUP 50X90 (CUSTOM PROCEDURE TRAY) ×2 IMPLANT
PAD ARMBOARD 7.5X6 YLW CONV (MISCELLANEOUS) ×8 IMPLANT
PENCIL BUTTON HOLSTER BLD 10FT (ELECTRODE) ×4 IMPLANT
SCALPEL HARMONIC ACE (MISCELLANEOUS) IMPLANT
SCISSORS LAP 5X35 DISP (ENDOMECHANICALS) ×2 IMPLANT
SET IRRIG TUBING LAPAROSCOPIC (IRRIGATION / IRRIGATOR) IMPLANT
SLEEVE ENDOPATH XCEL 5M (ENDOMECHANICALS) ×6 IMPLANT
SPONGE GAUZE 2X2 STER 10/PKG (GAUZE/BANDAGES/DRESSINGS) ×2
STRIP CLOSURE SKIN 1/2X4 (GAUZE/BANDAGES/DRESSINGS) ×3 IMPLANT
SUT MNCRL AB 4-0 PS2 18 (SUTURE) ×8 IMPLANT
SUT MON AB 4-0 PC3 18 (SUTURE) ×2 IMPLANT
SUT NOVA NAB GS-21 0 18 T12 DT (SUTURE) ×4 IMPLANT
SUT VIC AB 3-0 SH 27 (SUTURE) ×4
SUT VIC AB 3-0 SH 27X BRD (SUTURE) ×2 IMPLANT
SYR CONTROL 10ML LL (SYRINGE) ×4 IMPLANT
TOWEL OR 17X24 6PK STRL BLUE (TOWEL DISPOSABLE) ×4 IMPLANT
TOWEL OR 17X26 10 PK STRL BLUE (TOWEL DISPOSABLE) ×4 IMPLANT
TRAY FOLEY CATH 16FR SILVER (SET/KITS/TRAYS/PACK) ×2 IMPLANT
TRAY LAPAROSCOPIC (CUSTOM PROCEDURE TRAY) ×4 IMPLANT
TROCAR XCEL NON-BLD 11X100MML (ENDOMECHANICALS) ×4 IMPLANT
TROCAR XCEL NON-BLD 5MMX100MML (ENDOMECHANICALS) ×4 IMPLANT

## 2014-03-15 NOTE — Addendum Note (Signed)
Addendum created 03/15/14 1218 by Rogers Blocker, CRNA   Modules edited: Charges VN

## 2014-03-15 NOTE — Interval H&P Note (Signed)
History and Physical Interval Note: no change in H and P  03/15/2014 6:58 AM  John [name]  has presented today for surgery, with the diagnosis of incisional hernia un needed port a catheter   The various methods of treatment have been discussed with the patient and family. After consideration of risks, benefits and other options for treatment, the patient has consented to  Procedure(s): LAPAROSCOPIC INCISIONAL HERNIA (N/A) REMOVAL PORT-A-CATH (N/A) INSERTION OF MESH (N/A) as a surgical intervention .  The patient's history has been reviewed, patient examined, no change in status, stable for surgery.  I have reviewed the patient's chart and labs.  Questions were answered to the patient's satisfaction.     Kinley Ferrentino A

## 2014-03-15 NOTE — Anesthesia Postprocedure Evaluation (Signed)
Anesthesia Post Note  Patient: John [name]  Procedure(s) Performed: Procedure(s) (LRB): LAPAROSCOPIC INCISIONAL HERNIA (N/A) REMOVAL PORT-A-CATH (Left) INSERTION OF MESH (N/A)  Anesthesia type: general  Patient location: PACU  Post pain: Pain level controlled  Post assessment: Patient's Cardiovascular Status Stable  Last Vitals:  Filed Vitals:   03/15/14 1010  BP:   Pulse: 64  Temp:   Resp: 17    Post vital signs: Reviewed and stable  Level of consciousness: sedated  Complications: No apparent anesthesia complications

## 2014-03-15 NOTE — Anesthesia Procedure Notes (Signed)
Procedure Name: Intubation Date/Time: 03/15/2014 7:34 AM Performed by: Trixie Deis A Pre-anesthesia Checklist: Patient identified, Timeout performed, Emergency Drugs available, Suction available and Patient being monitored Patient Re-evaluated:Patient Re-evaluated prior to inductionOxygen Delivery Method: Circle system utilized Preoxygenation: Pre-oxygenation with 100% oxygen Intubation Type: IV induction Ventilation: Mask ventilation without difficulty and Oral airway inserted - appropriate to patient size Laryngoscope Size: Mac and 3 Grade View: Grade II Tube type: Oral Tube size: 7.5 mm Number of attempts: 1 Airway Equipment and Method: Stylet Placement Confirmation: ETT inserted through vocal cords under direct vision,  breath sounds checked- equal and bilateral and positive ETCO2 Secured at: 22 cm Tube secured with: Tape Dental Injury: Teeth and Oropharynx as per pre-operative assessment

## 2014-03-15 NOTE — Progress Notes (Signed)
ANTICOAGULATION CONSULT NOTE - Initial Consult  Pharmacy Consult for Lovenox Indication: Afib bridge, mechanical MVR  No Known Allergies  Patient Measurements: Weight: 233 lb (105.688 kg) Heparin Dosing Weight:    Vital Signs: Temp: 97.8 F (36.6 C) (07/06 1043) Temp src: Oral (07/06 1043) BP: 138/54 mmHg (07/06 1043) Pulse Rate: 71 (07/06 1043)  Labs:  Recent Labs  03/15/14 0612  APTT 33  LABPROT 15.6*  INR 1.24    The CrCl is unknown because both a height and weight (above a minimum accepted value) are required for this calculation.   Medical History: Past Medical History  Diagnosis Date  . HLD (hyperlipidemia)     takes Vytorin daily  . Rheumatic fever     as a child  . CAD (coronary artery disease)     a. s/p CABG 11/2000 (L-LAD, S-RCA);  b. Lexiscan Myoview 11/12: EF 45%, ischemia and possibly some scar at the base of the inferolateral wall;   c. Lex MV 11/13:  EF 44%, Inf and IL scar/soft tissue atten, small mild amt of inf ischemia,  . Atrial fibrillation     takes Coumadin daily  . Rheumatic heart disease     a. s/p mechanical (St. Jude) MVR 2002;  b. Echo 5/11: Mild LVH, EF 45-50%, mild AS, mild AI, mean gradient 11 mmHg, MVR with normal gradients, moderate LAE, mild RAE;  c.  Echo 11/13:   mod LVH, EF 55-60%, mild AS (mean 18 mmHg), mild AI, severe LAE, mild RAE, PASP 41  . 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 Anemia   . Arthritis     back   . HTN (hypertension)     takes Amlodipine,Metoprolol, and Ramipril daily  . DM (diabetes mellitus)     takes Januvia,Glipizide,and Metformin daily as well as Lantus  . Constipation     takes Colace daily  . Rheumatic fever     as a child  . Peripheral edema     takes Furosemide daily  . SOB (shortness of breath)     with exertion  . Pneumonia 09/2013  . Flu 09/2013  . Peripheral neuropathy   . 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 Bruises easily     takes Coumadin daily  . GERD (gastroesophageal reflux  disease)     takes Nexium daily  . History of colon polyps   . Nocturia   . Enlarged prostate   . History of kidney stones   . History of blood transfusion     no abnormal reaction noted  . Glaucoma   . Insomnia     states he has always been like this.But doesn't take any meds    Medications:  Prescriptions prior to admission  Medication Sig Dispense Refill  . amLODipine (NORVASC) 5 MG tablet Take 5 mg by mouth daily.      Marland Kitchen aspirin 81 MG chewable tablet Chew 81 mg by mouth daily.      . digoxin (LANOXIN) 0.25 MG tablet Take 0.25 mg by mouth every morning.       . Enoxaparin Sodium (LOVENOX IJ) Inject 1 each as directed every 12 (twelve) hours.      Marland Kitchen esomeprazole (NEXIUM) 40 MG capsule Take 40 mg by mouth at bedtime.       Marland Kitchen ezetimibe-simvastatin (VYTORIN) 10-40 MG per tablet Take 1 tablet by mouth at bedtime.      . furosemide (LASIX) 40 MG tablet Take 40 mg by mouth daily.       Marland Kitchen  glipiZIDE (GLUCOTROL XL) 10 MG 24 hr tablet Take 10 mg by mouth daily.       . insulin glargine (LANTUS) 100 UNIT/ML injection Inject 75 Units into the skin 2 (two) times daily.      . metFORMIN (GLUCOPHAGE) 1000 MG tablet Take 1,000 mg by mouth 2 (two) times daily.       . metoprolol (TOPROL-XL) 200 MG 24 hr tablet Take 200 mg by mouth daily after breakfast.       . Misc Natural Products (OSTEO BI-FLEX ADV TRIPLE ST) TABS Take 500 each by mouth daily as needed (joint pain).       . Omega-3 Fatty Acids (FISH OIL) 1000 MG CAPS Take 1 capsule by mouth 2 (two) times daily.       . potassium chloride SA (K-DUR,KLOR-CON) 20 MEQ tablet Take 10 mEq by mouth 3 (three) times a week. Takes Mondays, Wednesdays, and Saturdays.      . ramipril (ALTACE) 10 MG capsule Take 10 mg by mouth 2 (two) times daily.      . sitaGLIPtin (JANUVIA) 100 MG tablet Take 100 mg by mouth daily.       . Clobetasol Prop Emollient Base (CLOBETASOL PROPIONATE E) 0.05 % emollient cream Apply 1 application topically 2 (two) times daily as  needed. To affected areas      . HYDROcodone-acetaminophen (NORCO) 5-325 MG per tablet Take 1 tablet by mouth every 6 (six) hours as needed for pain.  30 tablet  0  . nitroGLYCERIN (NITROSTAT) 0.4 MG SL tablet Place 0.4 mg under the tongue every 5 (five) minutes as needed for chest pain.      Marland Kitchen warfarin (COUMADIN) 5 MG tablet Take 5 mg by mouth daily.         Assessment: 63 y/o M presents one year out status post exploratory laparotomy for a large intra-abdominal tumor (lymphoma). Chemotherapy is complete and pt wanted repair of incisional hernia and portacath removed. Patient is on chronic Coumadin for afib and mechanical MVR. Patient was on LMWH to bridge for surgery (last dose 7/5). INR 1.24 this AM.  Goal of Therapy:  Anti-Xa level 0.6-1 units/ml 4hrs after LMWH dose given Monitor platelets by anticoagulation protocol: Yes   Plan:  Spoke with Dr. Ninfa Linden who ok'd LMWH to be given 8pm or later. He will also give Coumadin tonight. Lovenox 100mg  SQ BID starting 2200. CBC q72h while on LMWH.   Euel Castile S. Alford Highland, PharmD, BCPS Clinical Staff Pharmacist Pager [phone]  [place], [place] 03/15/2014,10:46 AM

## 2014-03-15 NOTE — Transfer of Care (Signed)
Immediate Anesthesia Transfer of Care Note  Patient: John [name]  Procedure(s) Performed: Procedure(s): LAPAROSCOPIC INCISIONAL HERNIA (N/A) REMOVAL PORT-A-CATH (Left) INSERTION OF MESH (N/A)  Patient Location: PACU  Anesthesia Type:General  Level of Consciousness: awake, alert  and oriented  Airway & Oxygen Therapy: Patient Spontanous Breathing and Patient connected to nasal cannula oxygen  Post-op Assessment: Report given to PACU RN, Post -op Vital signs reviewed and stable and Patient moving all extremities X 4  Post vital signs: Reviewed and stable  Complications: No apparent anesthesia complications

## 2014-03-15 NOTE — Op Note (Signed)
NAMETHI, SOLDNER NO.:  [id number]  MEDICAL RECORD NO.:  [id number]  LOCATION:  6N13C                        FACILITY:  [facility]  PHYSICIAN:  Coralie Keens, M.D. DATE OF BIRTH:  [birth year]  DATE OF PROCEDURE:  03/15/2014 DATE OF DISCHARGE:                              OPERATIVE REPORT   PREOPERATIVE DIAGNOSES: 1. Incisional hernia. 2. A need of Port-A-Cath.  POSTOPERATIVE DIAGNOSES: 1. Incisional hernia. 2. A need of Port-A-Cath.  PROCEDURES: 1. Laparoscopic incisional hernia repair with mesh (25-cm x 33-cm Bard     Ventralight mesh). 2. Port-A-Cath removal.  SURGEON:  Coralie Keens, M.D.  ANESTHESIA:  General and 0.5% Marcaine.  ESTIMATED BLOOD LOSS:  Minimal.  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  He now presents after finishing chemotherapy for Port-A-Cath removal as well as repair of a large ventral incisional hernia.  PROCEDURE IN DETAIL:  The patient was brought to the operating room and identified as John [name].  He was placed supine on the operating room table and general anesthesia was induced.  His abdomen was then prepped and draped in usual sterile fashion.  I made a small incision with the scalpel in the patient's left upper quadrant.  I then used the 5-mm Optiview trocar and camera to slowly traverse all levels of the abdominal wall, again introduced into the peritoneal cavity under direct vision.  Insufflation of the abdomen was then begun.  The patient had a moderate amount of adhesions of omentum to the abdominal wall and one loop of small bowel stuck to the abdominal wall as well.  I placed an 11- mm trocar in the patient's left lower quadrant, a 5-mm incision in the patient's right upper quadrant, a 5-mm trocar in the patient's right upper quadrant, and a 5-mm trocar in the patient's right lower quadrant. I then  lysed all the adhesions with the laparoscopic scissors.  These adhesions came down easily from the abdominal wall.  I did have to remove some peritoneum from the abdominal wall with a loop of bowel was stuck in order to avoid injury to the loop of small bowel.  Once the loop of small bowel was removed, I examined it thoroughly and saw no evidence of bowel injury.  At this point, the size of the fascial defect was marked.  I brought a 25 x 33-cm piece of Bard Ventralight mesh onto the field.  Four separate stay sutures were placed in the corners of the mesh.  I then rolled the mesh and inserted it through the incision at the 11-mm trocar site.  I then unrolled the mesh under direct vision.  I then made four separate skin incisions and used the suture passer to pull the sutures up through the abdominal wall.  The suture was then tied in place pulling the mesh up against the peritoneal surface.  I then tacked in the mesh circumferentially with several rows of absorbable tackers under direct vision.  Good coverage of the fascial defect appeared to be achieved.  At this point, I again examined the one loop of small bowel, which was stuck  to the abdominal wall and again saw no evidence of injury to the loop of bowel.  Hemostasis appeared to be achieved.  All port sites were removed under direct vision and the abdomen was deflated.  All incisions were then anesthetized with Marcaine and closed with 4-0 Monocryl subcuticular sutures.  Steri- Strips and Band-Aids were then applied.  Next, the patient's left upper chest was prepped and draped in usual sterile fashion.  I opened the old incision with a scalpel.  I then identified the port and removed the sutures, and was able to remove the port and the catheter in its entirety.  I then closed the catheter introduction site with several 3-0 Vicryl sutures.  The skin was then closed with interrupted 3-0 Vicryl sutures and running 4-0 Monocryl.   Steri-Strips, gauze, and tape were then applied.  The patient tolerated the procedure well.  All the counts were correct at the end of the procedure.  The patient was then extubated in the operating room and taken in stable condition to the recovery room.     Coralie Keens, M.D.     DB/MEDQ  D:  03/15/2014  T:  03/15/2014  Job:  [id number]

## 2014-03-15 NOTE — Op Note (Signed)
LAPAROSCOPIC INCISIONAL HERNIA, REMOVAL PORT-A-CATH, INSERTION OF MESH  Procedure Note  Tyriq L Lauritsen 03/15/2014   Pre-op Diagnosis: INCISIONAL HERNIA, UNNEEDED PORT A CATH     Post-op Diagnosis: same  Procedure(s): LAPAROSCOPIC INCISIONAL HERNIA REMOVAL PORT-A-CATH INSERTION OF MESH  Surgeon(s): Harl Bowie, MD  Anesthesia: General  Staff:  Circulator: Jaci Standard, RN Relief Circulator: Tomma Rakers Sipsis, RN Relief Scrub: Tomma Rakers Sipsis, RN Scrub Person: Leslie Andrea, CST Circulator Assistant: Tomma Rakers Sipsis, RN  Estimated Blood Loss: Minimal                         Dorrien Grunder A   Date: 03/15/2014  Time: 9:18 AM

## 2014-03-15 NOTE — Addendum Note (Signed)
Addendum created 03/15/14 1217 by Rogers Blocker, CRNA   Modules edited: Charges VN

## 2014-03-15 NOTE — Anesthesia Preprocedure Evaluation (Addendum)
Anesthesia Evaluation  Patient identified by MRN, date of birth, ID band Patient awake    Reviewed: Allergy & Precautions, H&P , NPO status , Patient's Chart, lab work & pertinent test results  Airway Mallampati: I TM Distance: >3 FB Neck ROM: Full    Dental  (+) Teeth Intact, Dental Advisory Given   Pulmonary shortness of breath, former smoker,          Cardiovascular hypertension, + CAD and +CHF + dysrhythmias Atrial Fibrillation + Valvular Problems/Murmurs MR  Lexiscan Myoview 11/12: EF 45%, ischemia and possibly some scar at the base of the inferolateral wall;   c. Lex MV 11/13:  EF 44%, Inf and IL scar/soft tissue atten, small mild amt of inf ischemia,   Neuro/Psych  Neuromuscular disease    GI/Hepatic GERD-  ,  Endo/Other  diabetes, Type 2, Insulin Dependent, Oral Hypoglycemic AgentsMorbid obesity  Renal/GU      Musculoskeletal   Abdominal   Peds  Hematology  (+) anemia ,   Anesthesia Other Findings   Reproductive/Obstetrics                         Anesthesia Physical Anesthesia Plan  ASA: III  Anesthesia Plan: General   Post-op Pain Management:    Induction: Intravenous  Airway Management Planned: Oral ETT  Additional Equipment:   Intra-op Plan:   Post-operative Plan: Extubation in OR and Possible Post-op intubation/ventilation  Informed Consent: I have reviewed the patients History and Physical, chart, labs and discussed the procedure including the risks, benefits and alternatives for the proposed anesthesia with the patient or authorized representative who has indicated his/her understanding and acceptance.     Plan Discussed with: CRNA, Anesthesiologist and Surgeon  Anesthesia Plan Comments:       Anesthesia Quick Evaluation

## 2014-03-16 LAB — BASIC METABOLIC PANEL
Anion gap: 13 (ref 5–15)
Anion gap: 14 (ref 5–15)
BUN: 37 mg/dL — ABNORMAL HIGH (ref 6–23)
BUN: 38 mg/dL — ABNORMAL HIGH (ref 6–23)
CO2: 23 mEq/L (ref 19–32)
CO2: 22 mEq/L (ref 19–32)
Calcium: 8.2 mg/dL — ABNORMAL LOW (ref 8.4–10.5)
Calcium: 8.2 mg/dL — ABNORMAL LOW (ref 8.4–10.5)
Chloride: 100 mEq/L (ref 96–112)
Chloride: 99 mEq/L (ref 96–112)
Creatinine, Ser: 1.79 mg/dL — ABNORMAL HIGH (ref 0.50–1.35)
Creatinine, Ser: 1.79 mg/dL — ABNORMAL HIGH (ref 0.50–1.35)
GFR calc Af Amer: 46 mL/min — ABNORMAL LOW (ref >90)
GFR calc Af Amer: 46 mL/min — ABNORMAL LOW (ref >90)
GFR calc non Af Amer: 39 mL/min — ABNORMAL LOW (ref >90)
GFR calc non Af Amer: 39 mL/min — ABNORMAL LOW (ref >90)
Glucose, Bld: 293 mg/dL — ABNORMAL HIGH (ref 70–99)
Glucose, Bld: 291 mg/dL — ABNORMAL HIGH (ref 70–99)
Potassium: 7.3 mEq/L (ref 3.7–5.3)
Potassium: 6.2 mEq/L — ABNORMAL HIGH (ref 3.7–5.3)
Sodium: 136 mEq/L — ABNORMAL LOW (ref 137–147)
Sodium: 135 mEq/L — ABNORMAL LOW (ref 137–147)

## 2014-03-16 LAB — CBC
HCT: 33.3 % — ABNORMAL LOW (ref 39.0–52.0)
Hemoglobin: 10.3 g/dL — ABNORMAL LOW (ref 13.0–17.0)
MCH: 25.9 pg — ABNORMAL LOW (ref 26.0–34.0)
MCHC: 30.9 g/dL (ref 30.0–36.0)
MCV: 83.7 fL (ref 78.0–100.0)
Platelets: 206 10*3/uL (ref 150–400)
RBC: 3.98 MIL/uL — ABNORMAL LOW (ref 4.22–5.81)
RDW: 17 % — ABNORMAL HIGH (ref 11.5–15.5)
WBC: 12.3 10*3/uL — ABNORMAL HIGH (ref 4.0–10.5)

## 2014-03-16 LAB — GLUCOSE, CAPILLARY
Glucose-Capillary: 213 mg/dL — ABNORMAL HIGH (ref 70–99)
Glucose-Capillary: 300 mg/dL — ABNORMAL HIGH (ref 70–99)
Glucose-Capillary: 325 mg/dL — ABNORMAL HIGH (ref 70–99)
Glucose-Capillary: 244 mg/dL — ABNORMAL HIGH (ref 70–99)
Glucose-Capillary: 199 mg/dL — ABNORMAL HIGH (ref 70–99)
Glucose-Capillary: 218 mg/dL — ABNORMAL HIGH (ref 70–99)

## 2014-03-16 LAB — PROTIME-INR
INR: 1.18 (ref 0.00–1.49)
Prothrombin Time: 15 seconds (ref 11.6–15.2)

## 2014-03-16 MED ORDER — OXYCODONE-ACETAMINOPHEN 5-325 MG PO TABS
1.0000 | ORAL_TABLET | ORAL | Status: DC | PRN
  Administered 2014-03-16: 2 via ORAL
  Filled 2014-03-16: qty 2

## 2014-03-16 MED ORDER — POLYETHYLENE GLYCOL 3350 17 G PO PACK
17.0000 g | PACK | Freq: Every day | ORAL | Status: DC
  Administered 2014-03-16 – 2014-03-17 (×2): 17 g via ORAL
  Filled 2014-03-16: qty 1

## 2014-03-16 MED ORDER — INSULIN ASPART 100 UNIT/ML ~~LOC~~ SOLN
0.0000 [IU] | Freq: Every day | SUBCUTANEOUS | Status: DC
Start: 2014-03-16 — End: 2014-03-17
  Administered 2014-03-16: 2 [IU] via SUBCUTANEOUS

## 2014-03-16 MED ORDER — WARFARIN SODIUM 10 MG PO TABS
10.0000 mg | ORAL_TABLET | Freq: Once | ORAL | Status: AC
  Administered 2014-03-16: 10 mg via ORAL
  Filled 2014-03-16 (×2): qty 1

## 2014-03-16 MED ORDER — WARFARIN - PHYSICIAN DOSING INPATIENT: Freq: Every day | Status: DC

## 2014-03-16 MED ORDER — SODIUM CHLORIDE 0.9 % IV SOLN
INTRAVENOUS | Status: DC
  Administered 2014-03-16: 09:00:00 via INTRAVENOUS

## 2014-03-16 MED ORDER — INSULIN ASPART 100 UNIT/ML ~~LOC~~ SOLN
0.0000 [IU] | Freq: Three times a day (TID) | SUBCUTANEOUS | Status: DC
  Administered 2014-03-17: 3 [IU] via SUBCUTANEOUS

## 2014-03-16 NOTE — Progress Notes (Signed)
Utilization Review Completed.Donne Anon T7/03/2014

## 2014-03-16 NOTE — Progress Notes (Signed)
1 Day Post-Op  Subjective: Still with moderate pain, need IV pain meds  Objective: Vital signs in last 24 hours: Temp:  [97.2 F (36.2 C)-98.4 F (36.9 C)] 98 F (36.7 C) (07/07 0616) Pulse Rate:  [46-75] 75 (07/07 0616) Resp:  [17-21] 18 (07/07 0616) BP: (120-162)/(37-94) 147/74 mmHg (07/07 0616) SpO2:  [88 %-98 %] 97 % (07/07 0616) Weight:  [232 lb 15.7 oz (105.68 kg)] 232 lb 15.7 oz (105.68 kg) (07/06 2300) Last BM Date: 03/14/14  Intake/Output from previous day: 07/06 0701 - 07/07 0700 In: 2320 [P.O.:1320; I.V.:1000] Out: 1250 [Urine:1250] Intake/Output this shift:    Lungs clear Abdomen soft Binder in place  Lab Results:  No results found for this basename: WBC, HGB, HCT, PLT,  in the last 72 hours BMET No results found for this basename: NA, K, CL, CO2, GLUCOSE, BUN, CREATININE, CALCIUM,  in the last 72 hours PT/INR  Recent Labs  03/15/14 0612  LABPROT 15.6*  INR 1.24   ABG No results found for this basename: PHART, PCO2, PO2, HCO3,  in the last 72 hours  Studies/Results: No results found.  Anti-infectives: Anti-infectives   Start     Dose/Rate Route Frequency Ordered Stop   03/15/14 0600  ceFAZolin (ANCEF) IVPB 2 g/50 mL premix     2 g 100 mL/hr over 30 Minutes Intravenous On call to O.R. 03/14/14 1911 03/15/14 0736      Assessment/Plan: s/p Procedure(s): LAPAROSCOPIC INCISIONAL HERNIA (N/A) REMOVAL PORT-A-CATH (Left) INSERTION OF MESH (N/A)  Stable post op Given pain control, adjustments to chronic anticoagulation meds, risk of bleeding post op, I will keep him until tomorrow. Advance po  LOS: 1 day    Sanika Brosious A 03/16/2014

## 2014-03-16 NOTE — Discharge Instructions (Addendum)
Information on my medicine - Coumadin   (Warfarin)  This medication education was reviewed with me or my healthcare representative as part of my discharge preparation.  The pharmacist that spoke with me during my hospital stay was:  Wayland Salinas, [facility]  Why was Coumadin prescribed for you? Coumadin was prescribed for you because you have a blood clot or a medical condition that can cause an increased risk of forming blood clots. Blood clots can cause serious health problems by blocking the flow of blood to the heart, lung, or brain. Coumadin can prevent harmful blood clots from forming. As a reminder your indication for Coumadin is:   Blood Clot Prevention After Heart Valve Surgery  What test will check on my response to Coumadin? While on Coumadin (warfarin) you will need to have an INR test regularly to ensure that your dose is keeping you in the desired range. The INR (international normalized ratio) number is calculated from the result of the laboratory test called prothrombin time (PT).  If an INR APPOINTMENT HAS NOT ALREADY BEEN MADE FOR YOU please schedule an appointment to have this lab work done by your health care provider within 7 days. Your INR goal is usually a number between:  2 to 3 or your provider may give you a more narrow range like 2.5-3.5.  Ask your health care provider during an office visit what your goal INR is.  What  do you need to  know  About  COUMADIN? Take Coumadin (warfarin) exactly as prescribed by your healthcare provider about the same time each day.  DO NOT stop taking without talking to the doctor who prescribed the medication.  Stopping without other blood clot prevention medication to take the place of Coumadin may increase your risk of developing a new clot or stroke.  Get refills before you run out.  What do you do if you miss a dose? If you miss a dose, take it as soon as you remember on the same day then continue your regularly scheduled  regimen the next day.  Do not take two doses of Coumadin at the same time.  Important Safety Information A possible side effect of Coumadin (Warfarin) is an increased risk of bleeding. You should call your healthcare provider right away if you experience any of the following:   Bleeding from an injury or your nose that does not stop.   Unusual colored urine (red or dark brown) or unusual colored stools (red or black).   Unusual bruising for unknown reasons.   A serious fall or if you hit your head (even if there is no bleeding).  Some foods or medicines interact with Coumadin (warfarin) and might alter your response to warfarin. To help avoid this:   Eat a balanced diet, maintaining a consistent amount of Vitamin K.   Notify your provider about major diet changes you plan to make.   Avoid alcohol or limit your intake to 1 drink for women and 2 drinks for men per day. (1 drink is 5 oz. wine, 12 oz. beer, or 1.5 oz. liquor.)  Make sure that ANY health care provider who prescribes medication for you knows that you are taking Coumadin (warfarin).  Also make sure the healthcare provider who is monitoring your Coumadin knows when you have started a new medication including herbals and non-prescription products.  Coumadin (Warfarin)  Major Drug Interactions  Increased Warfarin Effect Decreased Warfarin Effect  Alcohol (large quantities) Antibiotics (esp. Septra/Bactrim, Flagyl, Cipro) Amiodarone (Cordarone)  Aspirin (ASA) Cimetidine (Tagamet) Megestrol (Megace) NSAIDs (ibuprofen, naproxen, etc.) Piroxicam (Feldene) Propafenone (Rythmol SR) Propranolol (Inderal) Isoniazid (INH) Posaconazole (Noxafil) Barbiturates (Phenobarbital) Carbamazepine (Tegretol) Chlordiazepoxide (Librium) Cholestyramine (Questran) Griseofulvin Oral Contraceptives Rifampin Sucralfate (Carafate) Vitamin K   Coumadin (Warfarin) Major Herbal Interactions  Increased Warfarin Effect Decreased Warfarin Effect    Garlic Ginseng Ginkgo biloba Coenzyme Q10 Green tea St. Johns wort    Coumadin (Warfarin) FOOD Interactions  Eat a consistent number of servings per week of foods HIGH in Vitamin K (1 serving =  cup)  Collards (cooked, or boiled & drained) Kale (cooked, or boiled & drained) Mustard greens (cooked, or boiled & drained) Parsley *serving size only =  cup Spinach (cooked, or boiled & drained) Swiss chard (cooked, or boiled & drained) Turnip greens (cooked, or boiled & drained)  Eat a consistent number of servings per week of foods MEDIUM-HIGH in Vitamin K (1 serving = 1 cup)  Asparagus (cooked, or boiled & drained) Broccoli (cooked, boiled & drained, or raw & chopped) Brussel sprouts (cooked, or boiled & drained) *serving size only =  cup Lettuce, raw (green leaf, endive, romaine) Spinach, raw Turnip greens, raw & chopped   These websites have more information on Coumadin (warfarin):  FailFactory.se; VeganReport.com.au;   CCS      [facility], [place] [phone]  OPEN ABDOMINAL SURGERY: POST OP INSTRUCTIONS  Always review your discharge instruction sheet given to you by the facility where your surgery was performed.  IF YOU HAVE DISABILITY OR FAMILY LEAVE FORMS, YOU MUST BRING THEM TO THE OFFICE FOR PROCESSING.  PLEASE DO NOT GIVE THEM TO YOUR DOCTOR.  1. A prescription for pain medication may be given to you upon discharge.  Take your pain medication as prescribed, if needed.  If narcotic pain medicine is not needed, then you may take acetaminophen (Tylenol) or ibuprofen (Advil) as needed. 2. Take your usually prescribed medications unless otherwise directed. 3. If you need a refill on your pain medication, please contact your pharmacy. They will contact our office to request authorization.  Prescriptions will not be filled after 5pm or on week-ends. 4. You should follow a light diet the first few days after arrival home, such as soup and  crackers, pudding, etc.unless your doctor has advised otherwise. A high-fiber, low fat diet can be resumed as tolerated.   Be sure to include lots of fluids daily. Most patients will experience some swelling and bruising on the chest and neck area.  Ice packs will help.  Swelling and bruising can take several days to resolve 5. Most patients will experience some swelling and bruising in the area of the incision. Ice pack will help. Swelling and bruising can take several days to resolve..  6. It is common to experience some constipation if taking pain medication after surgery.  Increasing fluid intake and taking a stool softener will usually help or prevent this problem from occurring.  A mild laxative (Milk of Magnesia or Miralax) should be taken according to package directions if there are no bowel movements after 48 hours. 7.  You may have steri-strips (small skin tapes) in place directly over the incision.  These strips should be left on the skin for 7-10 days.  If your surgeon used skin glue on the incision, you may shower in 24 hours.  The glue will flake off over the next 2-3 weeks.  Any sutures or staples will be removed at the office during your follow-up visit. You may find that a light  gauze bandage over your incision may keep your staples from being rubbed or pulled. You may shower and replace the bandage daily. 8. ACTIVITIES:  You may resume regular (light) daily activities beginning the next day--such as daily self-care, walking, climbing stairs--gradually increasing activities as tolerated.  You may have sexual intercourse when it is comfortable.  Refrain from any heavy lifting or straining until approved by your doctor. a. You may drive when you no longer are taking prescription pain medication, you can comfortably wear a seatbelt, and you can safely maneuver your car and apply brakes b. Return to Work: ___________________________________ 64. You should see your doctor in the office for a  follow-up appointment approximately two weeks after your surgery.  Make sure that you call for this appointment within a day or two after you arrive home to insure a convenient appointment time. OTHER INSTRUCTIONS:  _____________________________________________________________ _____________________________________________________________  WHEN TO CALL YOUR DOCTOR: 1. Fever over 101.0 2. Inability to urinate 3. Nausea and/or vomiting 4. Extreme swelling or bruising 5. Continued bleeding from incision. 6. Increased pain, redness, or drainage from the incision. 7. Difficulty swallowing or breathing 8. Muscle cramping or spasms. 9. Numbness or tingling in hands or feet or around lips.  The clinic staff is available to answer your questions during regular business hours.  Please dont hesitate to call and ask to speak to one of the nurses if you have concerns.  For further questions, please visit [email or web address]

## 2014-03-17 ENCOUNTER — Encounter (HOSPITAL_COMMUNITY): Payer: Self-pay | Admitting: Surgery

## 2014-03-17 ENCOUNTER — Telehealth (INDEPENDENT_AMBULATORY_CARE_PROVIDER_SITE_OTHER): Payer: Self-pay | Admitting: General Surgery

## 2014-03-17 LAB — PROTIME-INR
INR: 1.38 (ref 0.00–1.49)
Prothrombin Time: 17 seconds — ABNORMAL HIGH (ref 11.6–15.2)

## 2014-03-17 LAB — BASIC METABOLIC PANEL
Anion gap: 13 (ref 5–15)
BUN: 39 mg/dL — ABNORMAL HIGH (ref 6–23)
CO2: 24 mEq/L (ref 19–32)
Calcium: 8.5 mg/dL (ref 8.4–10.5)
Chloride: 97 mEq/L (ref 96–112)
Creatinine, Ser: 1.85 mg/dL — ABNORMAL HIGH (ref 0.50–1.35)
GFR calc Af Amer: 44 mL/min — ABNORMAL LOW (ref >90)
GFR calc non Af Amer: 38 mL/min — ABNORMAL LOW (ref >90)
Glucose, Bld: 215 mg/dL — ABNORMAL HIGH (ref 70–99)
Potassium: 5.8 mEq/L — ABNORMAL HIGH (ref 3.7–5.3)
Sodium: 134 mEq/L — ABNORMAL LOW (ref 137–147)

## 2014-03-17 LAB — GLUCOSE, CAPILLARY: Glucose-Capillary: 170 mg/dL — ABNORMAL HIGH (ref 70–99)

## 2014-03-17 MED ORDER — OXYCODONE-ACETAMINOPHEN 5-325 MG PO TABS: 1.0000 | ORAL_TABLET | ORAL | Status: DC | PRN

## 2014-03-17 NOTE — Telephone Encounter (Signed)
Called pt's wife back and let her know that Dr. Ninfa Linden wants him to have a Lovenox injection tonight at 6 with 2 coumadin tablets, 1 Lovenox inj tomorrow morning and then Dr. Percival Spanish will get in touch with them tmw to finish out the POC on his coumadin.

## 2014-03-17 NOTE — Telephone Encounter (Signed)
Patient's wife called in to make sure she had her husband's lovenox and coumadin instructions correct.  Informed her that it was not written in his d/c instructions and that I would ask Dr. Ninfa Linden and give them a call back.

## 2014-03-17 NOTE — Progress Notes (Signed)
Patient ID: John [name], male   DOB: [birth year], 63 y.o.   MRN: [id number]  Doing well  Plan discharge today

## 2014-03-17 NOTE — Discharge Summary (Signed)
Physician Discharge Summary  Patient ID: John [name] MRN: [id number] DOB/AGE: 04-10-1951 63 y.o.  Admit date: 03/15/2014 Discharge date: 03/17/2014  Admission Diagnoses:  Discharge Diagnoses:  Active Problems:   Incisional hernia A-fib Chronic anticoagulation  Discharged Condition: good  Hospital Course: uneventful post op recovery s/p surgery.  Kept one extra day for adjustment of anticoag medication and pain control  Consults: None  Significant Diagnostic Studies:   Treatments: surgery: laparoscopic incisional hernia repair with mesh; port a cath removal  Discharge Exam: Blood pressure 139/48, pulse 78, temperature 97.6 F (36.4 C), temperature source Oral, resp. rate 19, height 6' (1.829 m), weight 232 lb 15.7 oz (105.68 kg), SpO2 99.00%. General appearance: alert, cooperative and no distress Resp: clear to auscultation bilaterally Cardio: irregularly irregular rhythm Abdomen soft, incisions clean  Disposition: 01-Home or Self Care     Medication List         amLODipine 5 MG tablet  Commonly known as:  NORVASC  Take 5 mg by mouth daily.     aspirin 81 MG chewable tablet  Chew 81 mg by mouth daily.     CLOBETASOL PROPIONATE E 0.05 % emollient cream  Generic drug:  Clobetasol Prop Emollient Base  Apply 1 application topically 2 (two) times daily as needed. To affected areas     digoxin 0.25 MG tablet  Commonly known as:  LANOXIN  Take 0.25 mg by mouth every morning.     esomeprazole 40 MG capsule  Commonly known as:  NEXIUM  Take 40 mg by mouth at bedtime.     ezetimibe-simvastatin 10-40 MG per tablet  Commonly known as:  VYTORIN  Take 1 tablet by mouth at bedtime.     Fish Oil 1000 MG Caps  Take 1 capsule by mouth 2 (two) times daily.     furosemide 40 MG tablet  Commonly known as:  LASIX  Take 40 mg by mouth daily.     glipiZIDE 10 MG 24 hr tablet  Commonly known as:  GLUCOTROL XL  Take 10 mg by mouth daily.     HYDROcodone-acetaminophen  5-325 MG per tablet  Commonly known as:  NORCO  Take 1 tablet by mouth every 6 (six) hours as needed for pain.     insulin glargine 100 UNIT/ML injection  Commonly known as:  LANTUS  Inject 75 Units into the skin 2 (two) times daily.     LOVENOX IJ  Inject 1 each as directed every 12 (twelve) hours.     metFORMIN 1000 MG tablet  Commonly known as:  GLUCOPHAGE  Take 1,000 mg by mouth 2 (two) times daily.     metoprolol 200 MG 24 hr tablet  Commonly known as:  TOPROL-XL  Take 200 mg by mouth daily after breakfast.     nitroGLYCERIN 0.4 MG SL tablet  Commonly known as:  NITROSTAT  Place 0.4 mg under the tongue every 5 (five) minutes as needed for chest pain.     OSTEO BI-FLEX ADV TRIPLE ST Tabs  Take 500 each by mouth daily as needed (joint pain).     oxyCODONE-acetaminophen 5-325 MG per tablet  Commonly known as:  ROXICET  Take 1-2 tablets by mouth every 4 (four) hours as needed for severe pain.     potassium chloride SA 20 MEQ tablet  Commonly known as:  K-DUR,KLOR-CON  Take 10 mEq by mouth 3 (three) times a week. Takes Mondays, Wednesdays, and Saturdays.     ramipril 10 MG capsule  Commonly known  as:  ALTACE  Take 10 mg by mouth 2 (two) times daily.     sitaGLIPtin 100 MG tablet  Commonly known as:  JANUVIA  Take 100 mg by mouth daily.     warfarin 5 MG tablet  Commonly known as:  COUMADIN  Take 5 mg by mouth daily.           Follow-up Information   Follow up with Harl Bowie, MD On 04/05/2014.   Specialty:  General Surgery   Contact information:   [address] [place] [address] [phone]       Signed: Harl Bowie 03/17/2014, 7:36 AM

## 2014-03-17 NOTE — Progress Notes (Signed)
Patient discharge to home.  Discharged instructions given to patient and wife.  No question verbalized.

## 2014-03-18 ENCOUNTER — Telehealth: Payer: Self-pay | Admitting: Pharmacist

## 2014-03-18 NOTE — Telephone Encounter (Signed)
Spoke with pt's wife.  INR managed by Dr. Edrick Oh.  Will forward information to his office.  Instructed pt to make sure he has an appt to check his INR again within the next few days.

## 2014-03-18 NOTE — Telephone Encounter (Signed)
Message copied by Aris Georgia on Thu Mar 18, 2014 12:03 PM ------      Message from: Minus Breeding      Created: Wed Mar 17, 2014  8:33 AM                   ----- Message -----         From: Harl Bowie, MD         Sent: 03/17/2014   7:28 AM           To: Minus Breeding, MD            He did well with the laparoscopic incisional hernia repair and is going home today.  His INR is up to 1.38.  I am having him continue the lovenox today and tomorrow.  I am having him take another 10 of coumadin today and then back to 5 tomorrow.            If you could then take back over.            Thanks again,            Nedra Hai ------

## 2014-04-05 ENCOUNTER — Ambulatory Visit (INDEPENDENT_AMBULATORY_CARE_PROVIDER_SITE_OTHER): Payer: BC Managed Care – PPO | Admitting: Surgery

## 2014-04-05 ENCOUNTER — Encounter (INDEPENDENT_AMBULATORY_CARE_PROVIDER_SITE_OTHER): Payer: Self-pay | Admitting: Surgery

## 2014-04-05 VITALS — BP 147/87 | HR 71 | Temp 98.1°F | Resp 18 | Ht 72.0 in | Wt 231.4 lb

## 2014-04-05 DIAGNOSIS — Z09 Encounter for follow-up examination after completed treatment for conditions other than malignant neoplasm: Secondary | ICD-10-CM

## 2014-04-05 NOTE — Progress Notes (Signed)
Subjective:     Patient ID: John [name], male   DOB: 1951/02/10, 63 y.o.   MRN: [id number]  HPI He is here for his first postop visit status post laparoscopic incisional hernia repair with mesh and Port-A-Cath removal. He is doing well and has minimal discomfort.  Review of Systems     Objective:   Physical Exam On exam, his abdomen is soft and his incisions are well healed    Assessment:     Patient stable postop     Plan:     He will refrain from lifting for one more week. He will then resume his normal activity. I will see him back as needed

## 2014-04-27 ENCOUNTER — Ambulatory Visit (HOSPITAL_BASED_OUTPATIENT_CLINIC_OR_DEPARTMENT_OTHER): Payer: BC Managed Care – PPO | Admitting: Oncology

## 2014-04-27 ENCOUNTER — Telehealth: Payer: Self-pay | Admitting: Oncology

## 2014-04-27 ENCOUNTER — Other Ambulatory Visit (HOSPITAL_BASED_OUTPATIENT_CLINIC_OR_DEPARTMENT_OTHER): Payer: BC Managed Care – PPO

## 2014-04-27 VITALS — BP 138/83 | HR 71 | Temp 99.4°F | Resp 18 | Ht 72.0 in | Wt 230.8 lb

## 2014-04-27 DIAGNOSIS — C8588 Other specified types of non-Hodgkin lymphoma, lymph nodes of multiple sites: Secondary | ICD-10-CM

## 2014-04-27 DIAGNOSIS — C8299 Follicular lymphoma, unspecified, extranodal and solid organ sites: Secondary | ICD-10-CM

## 2014-04-27 DIAGNOSIS — D649 Anemia, unspecified: Secondary | ICD-10-CM

## 2014-04-27 LAB — CBC WITH DIFFERENTIAL/PLATELET
BASO%: 1.1 % (ref 0.0–2.0)
Basophils Absolute: 0.1 10*3/uL (ref 0.0–0.1)
EOS%: 7.3 % — ABNORMAL HIGH (ref 0.0–7.0)
Eosinophils Absolute: 0.6 10*3/uL — ABNORMAL HIGH (ref 0.0–0.5)
HCT: 34 % — ABNORMAL LOW (ref 38.4–49.9)
HGB: 11 g/dL — ABNORMAL LOW (ref 13.0–17.1)
LYMPH%: 13.1 % — ABNORMAL LOW (ref 14.0–49.0)
MCH: 25.6 pg — ABNORMAL LOW (ref 27.2–33.4)
MCHC: 32.3 g/dL (ref 32.0–36.0)
MCV: 79.4 fL (ref 79.3–98.0)
MONO#: 1 10*3/uL — ABNORMAL HIGH (ref 0.1–0.9)
MONO%: 12.3 % (ref 0.0–14.0)
NEUT#: 5.5 10*3/uL (ref 1.5–6.5)
NEUT%: 66.2 % (ref 39.0–75.0)
Platelets: 261 10*3/uL (ref 140–400)
RBC: 4.28 10*6/uL (ref 4.20–5.82)
RDW: 18 % — ABNORMAL HIGH (ref 11.0–14.6)
WBC: 8.2 10*3/uL (ref 4.0–10.3)
lymph#: 1.1 10*3/uL (ref 0.9–3.3)

## 2014-04-27 LAB — COMPREHENSIVE METABOLIC PANEL (CC13)
ALT: 13 U/L (ref 0–55)
AST: 16 U/L (ref 5–34)
Albumin: 3.8 g/dL (ref 3.5–5.0)
Alkaline Phosphatase: 132 U/L (ref 40–150)
Anion Gap: 10 mEq/L (ref 3–11)
BUN: 59 mg/dL — ABNORMAL HIGH (ref 7.0–26.0)
CO2: 22 mEq/L (ref 22–29)
Calcium: 9.4 mg/dL (ref 8.4–10.4)
Chloride: 108 mEq/L (ref 98–109)
Creatinine: 1.5 mg/dL — ABNORMAL HIGH (ref 0.7–1.3)
Glucose: 145 mg/dl — ABNORMAL HIGH (ref 70–140)
Potassium: 4.9 mEq/L (ref 3.5–5.1)
Sodium: 140 mEq/L (ref 136–145)
Total Bilirubin: 0.35 mg/dL (ref 0.20–1.20)
Total Protein: 7.3 g/dL (ref 6.4–8.3)

## 2014-04-27 NOTE — Telephone Encounter (Signed)
gv adn printed appt scehd and avs for Dec...gv pt barium

## 2014-04-27 NOTE — Progress Notes (Signed)
Hematology and Oncology Follow Up Visit  John [name] [id number] 11/16/1950 63 y.o. 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   Prior Therapy: He is status post exploratory laparotomy and excision of an intra-abdominal mass and ileocolectomy in a Port-A-Cath placement on 02/09/2013 Chemotherapy with CHOP-R cycle one given on 04/16/2013. He is status post 4 cycles of chemotherapy completed in 06/2013.  Current therapy:  Observation and surveillance.  Interim History:  John [name] presents today for a followup visit with his wife. Since the last visit he had a hernia repair in July of 2015. He recovered well without any complications. He is not reporting any abdominal pain or discomfort. His Mediport in the early satiety or change in his bowel habits. He denies fevers but reports some mild occasional shortness of breath with activity but is able to perform his activities of daily living without difficulty. His LE swelling is improved. He denies any problems with nausea or vomiting. He did not report any lymphadenopathy or easy bruisability. Has not reported any petechiae or bleeding. Has not reported a decline his energy her performance status. His Mediport in headaches or blurred vision or syncope. He does not report any frequency urgency or hesitancy. Rest of his review of systems unremarkable.  Medications: I have reviewed the patient's current medications.  Current Outpatient Prescriptions  Medication Sig Dispense Refill  . amLODipine (NORVASC) 5 MG tablet Take 5 mg by mouth daily.      Marland Kitchen aspirin 81 MG chewable tablet Chew 81 mg by mouth daily.      . Clobetasol Prop Emollient Base (CLOBETASOL PROPIONATE E) 0.05 %  emollient cream Apply 1 application topically 2 (two) times daily as needed. To affected areas      . digoxin (LANOXIN) 0.25 MG tablet Take 0.25 mg by mouth every morning.       . Enoxaparin Sodium (LOVENOX IJ) Inject 1 each as directed every 12 (twelve) hours.      Marland Kitchen esomeprazole (NEXIUM) 40 MG capsule Take 40 mg by mouth at bedtime.       Marland Kitchen ezetimibe-simvastatin (VYTORIN) 10-40 MG per tablet Take 1 tablet by mouth at bedtime.      . furosemide (LASIX) 40 MG tablet Take 40 mg by mouth daily.       Marland Kitchen glipiZIDE (GLUCOTROL XL) 10 MG 24 hr tablet Take 10 mg by mouth daily.       Marland Kitchen HYDROcodone-acetaminophen (NORCO) 5-325 MG per tablet Take 1 tablet by mouth every 6 (six) hours as needed for pain.  30 tablet  0  . insulin glargine (LANTUS) 100 UNIT/ML injection Inject 75 Units into the skin 2 (two) times daily.      . metFORMIN (GLUCOPHAGE) 1000 MG tablet Take 1,000 mg by mouth 2 (two) times daily.       . metoprolol (TOPROL-XL) 200 MG 24 hr tablet Take 200 mg by mouth daily after breakfast.       . Misc Natural Products (OSTEO BI-FLEX ADV TRIPLE ST) TABS Take 500 each by mouth daily as needed (joint pain).       . nitroGLYCERIN (NITROSTAT) 0.4 MG SL tablet Place 0.4 mg under the tongue every 5 (five) minutes as needed for chest pain.      . Omega-3  Fatty Acids (FISH OIL) 1000 MG CAPS Take 1 capsule by mouth 2 (two) times daily.       . potassium chloride SA (K-DUR,KLOR-CON) 20 MEQ tablet Take 10 mEq by mouth 3 (three) times a week. Takes Mondays, Wednesdays, and Saturdays.      . ramipril (ALTACE) 10 MG capsule Take 10 mg by mouth 2 (two) times daily.      . sitaGLIPtin (JANUVIA) 100 MG tablet Take 100 mg by mouth daily.       Marland Kitchen warfarin (COUMADIN) 5 MG tablet Take 5 mg by mouth daily.        No current facility-administered medications for this visit.     Allergies: No Known Allergies  Past Medical History, Surgical history, Social history, and Family History were reviewed and  updated.    Physical Exam: Blood pressure 138/83, pulse 71, temperature 99.4 F (37.4 C), temperature source Oral, resp. rate 18, height 6' (1.829 m), weight 230 lb 12.8 oz (104.69 kg). ECOG: 1 General appearance: alert Head: Normocephalic, without obvious abnormality, atraumatic Neck: no adenopathy Lymph nodes: Cervical, supraclavicular, and axillary nodes normal. Heart: Bradycardic and have regular. Lung:chest clear, no wheezing, rales, normal symmetric air entry Abdomen: soft, non-tender, without masses or organomegaly EXT:no erythema, induration, or nodules Abdominal wound completely healed.   Lab Results: Lab Results  Component Value Date   WBC 8.2 04/27/2014   HGB 11.0* 04/27/2014   HCT 34.0* 04/27/2014   MCV 79.4 04/27/2014   PLT 261 04/27/2014     Chemistry      Component Value Date/Time   NA 134* 03/17/2014 0010   NA 144 12/22/2013 0909   K 5.8* 03/17/2014 0010   K 4.8 12/22/2013 0909   CL 97 03/17/2014 0010   CL 106 01/21/2013 1050   CO2 24 03/17/2014 0010   CO2 26 12/22/2013 0909   BUN 39* 03/17/2014 0010   BUN 38.4* 12/22/2013 0909   CREATININE 1.85* 03/17/2014 0010   CREATININE 1.3 12/22/2013 0909      Component Value Date/Time   CALCIUM 8.5 03/17/2014 0010   CALCIUM 9.8 12/22/2013 0909   ALKPHOS 125 12/22/2013 0909   AST 21 12/22/2013 0909   ALT 14 12/22/2013 0909   BILITOT 0.40 12/22/2013 0909        Impression and Plan: 63 year old gentleman with the following issues:  1. 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 He is status post excisional biopsy and ileocolectomy followed by systemic chemotherapy. CT scan results from 12/22/2013 showed no evidence of disease relapse. The plan is to continue with observation and surveillance and repeat imaging studies in December of 2015 and every 6 months after that.   2. Anemia: Probably multifactorial in nature likely related to chemotherapy and surgery. His hemoglobin  continued to improve at this time..  3. Abdominal wound: Completely healed at this time.   4. IV access: PAC as were removed without any complications.  5. Followup: Will be in 4 months.      John [name] 8/18/20159:16 AM

## 2014-08-17 ENCOUNTER — Ambulatory Visit (HOSPITAL_COMMUNITY)
Admission: RE | Admit: 2014-08-17 | Discharge: 2014-08-17 | Disposition: A | Discharge status: Home / Self Care | Payer: BC Managed Care – PPO | Source: Ambulatory Visit | Attending: Oncology | Admitting: Oncology

## 2014-08-17 ENCOUNTER — Other Ambulatory Visit (HOSPITAL_BASED_OUTPATIENT_CLINIC_OR_DEPARTMENT_OTHER): Payer: BC Managed Care – PPO

## 2014-08-17 DIAGNOSIS — Z9221 Personal history of antineoplastic chemotherapy: Secondary | ICD-10-CM | POA: Insufficient documentation

## 2014-08-17 DIAGNOSIS — C8299 Follicular lymphoma, unspecified, extranodal and solid organ sites: Secondary | ICD-10-CM

## 2014-08-17 DIAGNOSIS — C8589 Other specified types of non-Hodgkin lymphoma, extranodal and solid organ sites: Secondary | ICD-10-CM | POA: Insufficient documentation

## 2014-08-17 DIAGNOSIS — C8598 Non-Hodgkin lymphoma, unspecified, lymph nodes of multiple sites: Secondary | ICD-10-CM

## 2014-08-17 LAB — CBC WITH DIFFERENTIAL/PLATELET
BASO%: 1.2 % (ref 0.0–2.0)
Basophils Absolute: 0.1 10*3/uL (ref 0.0–0.1)
EOS%: 3.3 % (ref 0.0–7.0)
Eosinophils Absolute: 0.3 10*3/uL (ref 0.0–0.5)
HCT: 37.5 % — ABNORMAL LOW (ref 38.4–49.9)
HGB: 11.4 g/dL — ABNORMAL LOW (ref 13.0–17.1)
LYMPH%: 12.6 % — ABNORMAL LOW (ref 14.0–49.0)
MCH: 24.2 pg — ABNORMAL LOW (ref 27.2–33.4)
MCHC: 30.5 g/dL — ABNORMAL LOW (ref 32.0–36.0)
MCV: 79.5 fL (ref 79.3–98.0)
MONO#: 1 10*3/uL — ABNORMAL HIGH (ref 0.1–0.9)
MONO%: 11.2 % (ref 0.0–14.0)
NEUT#: 6.3 10*3/uL (ref 1.5–6.5)
NEUT%: 71.7 % (ref 39.0–75.0)
Platelets: 251 10*3/uL (ref 140–400)
RBC: 4.72 10*6/uL (ref 4.20–5.82)
RDW: 17.4 % — ABNORMAL HIGH (ref 11.0–14.6)
WBC: 8.9 10*3/uL (ref 4.0–10.3)
lymph#: 1.1 10*3/uL (ref 0.9–3.3)

## 2014-08-17 LAB — COMPREHENSIVE METABOLIC PANEL (CC13)
ALT: 20 U/L (ref 0–55)
AST: 21 U/L (ref 5–34)
Albumin: 3.9 g/dL (ref 3.5–5.0)
Alkaline Phosphatase: 131 U/L (ref 40–150)
Anion Gap: 10 mEq/L (ref 3–11)
BUN: 46.2 mg/dL — ABNORMAL HIGH (ref 7.0–26.0)
CO2: 26 mEq/L (ref 22–29)
Calcium: 9.5 mg/dL (ref 8.4–10.4)
Chloride: 103 mEq/L (ref 98–109)
Creatinine: 1.3 mg/dL (ref 0.7–1.3)
EGFR: 58 mL/min/{1.73_m2} — ABNORMAL LOW (ref >90)
Glucose: 118 mg/dl (ref 70–140)
Potassium: 5.1 mEq/L (ref 3.5–5.1)
Sodium: 140 mEq/L (ref 136–145)
Total Bilirubin: 0.51 mg/dL (ref 0.20–1.20)
Total Protein: 7.1 g/dL (ref 6.4–8.3)

## 2014-08-17 MED ORDER — IOHEXOL 300 MG/ML  SOLN
100.0000 mL | Freq: Once | INTRAMUSCULAR | Status: AC | PRN
  Administered 2014-08-17: 100 mL via INTRAVENOUS

## 2014-08-19 ENCOUNTER — Telehealth: Payer: Self-pay | Admitting: Oncology

## 2014-08-19 ENCOUNTER — Ambulatory Visit (HOSPITAL_BASED_OUTPATIENT_CLINIC_OR_DEPARTMENT_OTHER): Payer: BC Managed Care – PPO | Admitting: Oncology

## 2014-08-19 VITALS — BP 145/37 | HR 54 | Temp 97.7°F | Resp 18 | Ht 72.0 in | Wt 236.1 lb

## 2014-08-19 DIAGNOSIS — C8590 Non-Hodgkin lymphoma, unspecified, unspecified site: Secondary | ICD-10-CM

## 2014-08-19 DIAGNOSIS — C8338 Diffuse large B-cell lymphoma, lymph nodes of multiple sites: Secondary | ICD-10-CM

## 2014-08-19 DIAGNOSIS — D649 Anemia, unspecified: Secondary | ICD-10-CM

## 2014-08-19 NOTE — Telephone Encounter (Signed)
Gave avs & cal for May 2016. °

## 2014-08-19 NOTE — Progress Notes (Signed)
Hematology and Oncology Follow Up Visit  John [name] [id number] October 09, 1950 63 y.o. 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   Prior Therapy: He is status post exploratory laparotomy and excision of an intra-abdominal mass and ileocolectomy in a Port-A-Cath placement on 02/09/2013 Chemotherapy with CHOP-R cycle one given on 04/16/2013. He is status post 4 cycles of chemotherapy completed in 06/2013.  Current therapy:  Observation and surveillance.  Interim History:  Mr. Billing presents today for a followup visit by himself. Since the last visit, he has been doing very well.  he reports he have felt the best in the last 2 years. He is modifying his dietary habits although he still gaining weight at this time. His blood sugar is slightly elevated and he is making life style adjustments to help with that. He remains on insulin however. He is not reporting any abdominal pain or discomfort. He  reports no early satiety or change in his bowel habits. He denies fevers but reports some mild occasional shortness of breath with activity but is able to perform his activities of daily living without difficulty. His  leg swelling is improved. He denies any problems with nausea or vomiting. He did not report any lymphadenopathy or easy bruisability. Has not reported any petechiae or bleeding. Has not reported a decline his energy her performance status. His Mediport in headaches or blurred vision or syncope. He does not report any frequency urgency or hesitancy. Rest of his review of systems unremarkable.  Medications: I have reviewed the patient's current medications.  Current Outpatient Prescriptions  Medication Sig Dispense Refill   . amLODipine (NORVASC) 5 MG tablet Take 5 mg by mouth daily.    Marland Kitchen aspirin 81 MG chewable tablet Chew 81 mg by mouth daily.    Marland Kitchen BAYER BREEZE 2 TEST DISK   5  . Clobetasol Prop Emollient Base (CLOBETASOL PROPIONATE E) 0.05 % emollient cream Apply 1 application topically 2 (two) times daily as needed. To affected areas    . digoxin (LANOXIN) 0.25 MG tablet Take 0.25 mg by mouth every morning.     . Enoxaparin Sodium (LOVENOX IJ) Inject 1 each as directed every 12 (twelve) hours.    Marland Kitchen esomeprazole (NEXIUM) 40 MG capsule Take 40 mg by mouth at bedtime.     Marland Kitchen ezetimibe-simvastatin (VYTORIN) 10-40 MG per tablet Take 1 tablet by mouth at bedtime.    . furosemide (LASIX) 40 MG tablet Take 40 mg by mouth daily.     Marland Kitchen glipiZIDE (GLUCOTROL XL) 10 MG 24 hr tablet Take 10 mg by mouth daily.     Marland Kitchen HYDROcodone-acetaminophen (NORCO) 5-325 MG per tablet Take 1 tablet by mouth every 6 (six) hours as needed for pain. 30 tablet 0  . insulin glargine (LANTUS) 100 UNIT/ML injection Inject 75 Units into the skin 2 (two) times daily.    . metFORMIN (GLUCOPHAGE) 1000 MG tablet Take 1,000 mg by mouth 2 (two) times daily.     . metoprolol (TOPROL-XL) 200 MG 24 hr tablet Take 200 mg by mouth daily after breakfast.     . Misc Natural Products (OSTEO BI-FLEX ADV TRIPLE ST) TABS Take 500 each by mouth daily as needed (joint pain).     . nitroGLYCERIN (NITROSTAT) 0.4 MG SL tablet  Place 0.4 mg under the tongue every 5 (five) minutes as needed for chest pain.    . Omega-3 Fatty Acids (FISH OIL) 1000 MG CAPS Take 1 capsule by mouth 2 (two) times daily.     . potassium chloride (K-DUR) 10 MEQ tablet Take 10 mEq by mouth.    . potassium chloride SA (K-DUR,KLOR-CON) 20 MEQ tablet Take 10 mEq by mouth 3 (three) times a week. Takes Mondays, Wednesdays, and Saturdays.    . ramipril (ALTACE) 10 MG capsule Take 10 mg by mouth 2 (two) times daily.    . sitaGLIPtin (JANUVIA) 100 MG tablet Take 100 mg by mouth daily.     Marland Kitchen warfarin  (COUMADIN) 5 MG tablet Take 5 mg by mouth daily.      No current facility-administered medications for this visit.     Allergies: No Known Allergies  Past Medical History, Surgical history, Social history, and Family History were reviewed and updated.    Physical Exam: Blood pressure 145/37, pulse 54, temperature 97.7 F (36.5 C), temperature source Oral, resp. rate 18, height 6' (1.829 m), weight 236 lb 1.6 oz (107.094 kg). ECOG: 1 General appearance: alert Head: Normocephalic, without obvious abnormality Neck: no adenopathy Lymph nodes: Cervical, supraclavicular, and axillary nodes normal. Heart: Bradycardic and have regular. Lung:chest clear, no wheezing, rales, normal symmetric air entry Abdomen: soft, non-tender, without masses or organomegaly EXT:no erythema, induration, or nodules Abdominal wound completely healed.   Lab Results: Lab Results  Component Value Date   WBC 8.9 08/17/2014   HGB 11.4* 08/17/2014   HCT 37.5* 08/17/2014   MCV 79.5 08/17/2014   PLT 251 08/17/2014     Chemistry      Component Value Date/Time   NA 140 08/17/2014 0759   NA 134* 03/17/2014 0010   K 5.1 08/17/2014 0759   K 5.8* 03/17/2014 0010   CL 97 03/17/2014 0010   CL 106 01/21/2013 1050   CO2 26 08/17/2014 0759   CO2 24 03/17/2014 0010   BUN 46.2* 08/17/2014 0759   BUN 39* 03/17/2014 0010   CREATININE 1.3 08/17/2014 0759   CREATININE 1.85* 03/17/2014 0010      Component Value Date/Time   CALCIUM 9.5 08/17/2014 0759   CALCIUM 8.5 03/17/2014 0010   ALKPHOS 131 08/17/2014 0759   AST 21 08/17/2014 0759   ALT 20 08/17/2014 0759   BILITOT 0.51 08/17/2014 0759      EXAM: CT CHEST, ABDOMEN, AND PELVIS WITH CONTRAST  TECHNIQUE: Multidetector CT imaging of the chest, abdomen and pelvis was performed following the standard protocol during bolus administration of intravenous contrast.  CONTRAST: 124mL OMNIPAQUE IOHEXOL 300 MG/ML SOLN  COMPARISON:  12/22/2013  FINDINGS: CT CHEST FINDINGS  Mediastinum/Hilar Regions: No masses or pathologically enlarged lymph nodes identified.  Other Thoracic Lymphadenopathy: None.  Lungs: No pulmonary infiltrate or mass identified.  Pleura: No evidence of effusion or mass.  Vascular/Cardiac: No acute findings identified. Stable cardiomegaly and prosthetic mitral valve.  Other: None.  Musculoskeletal: No suspicious bone lesions identified.  CT ABDOMEN AND PELVIS FINDINGS  Hepatobiliary: Stable tiny subcapsular hepatic cyst. No liver masses identified. Gallbladder is unremarkable.  Pancreas: No mass, inflammatory changes, or other parenchymal abnormality identified.  Spleen: Within normal limits in size and appearance.  Adrenal Glands: No mass identified.  Kidneys/Urinary Tract: No masses identified. No evidence of hydronephrosis.  Stomach/Bowel/Peritoneum: No evidence of wall thickening, mass, or obstruction.  Vascular/Lymphatic: No pathologically enlarged lymph nodes identified. No other significant abnormality identified.  Reproductive: No mass  or other significant abnormality identified.  Other: None.  Musculoskeletal: No suspicious bone lesions identified.  IMPRESSION: Stable exam. No evidence of recurrent lymphoma or other acute findings within the chest, abdomen, or pelvis.    Impression and Plan: 63 year old gentleman with the following issues:  1. 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 He is status post excisional biopsy and ileocolectomy followed by systemic chemotherapy. CT scan results from  08/17/2014 was discussed today and showed no evidence of disease relapse. The plan is to continue with observation and surveillance and repeat imaging studies in October 2016. He will return for a clinical visit in 5 months.   2. Anemia: Probably multifactorial in nature likely  related to chemotherapy and surgery. His hemoglobin continued to improve at this time. He is asymptomatic from this.  3. Abdominal wound: Completely healed at this time.   4. IV access: PAC as were removed without any complications.  5. Followup: Will be in 5 months.      Zian Mohamed 12/10/20158:48 AM

## 2014-09-15 ENCOUNTER — Ambulatory Visit (INDEPENDENT_AMBULATORY_CARE_PROVIDER_SITE_OTHER): Payer: BLUE CROSS/BLUE SHIELD | Admitting: Cardiology

## 2014-09-15 ENCOUNTER — Encounter: Payer: Self-pay | Admitting: Cardiology

## 2014-09-15 VITALS — BP 118/62 | HR 62 | Ht 72.0 in | Wt 234.0 lb

## 2014-09-15 DIAGNOSIS — I482 Chronic atrial fibrillation, unspecified: Secondary | ICD-10-CM

## 2014-09-15 NOTE — Patient Instructions (Signed)
The current medical regimen is effective;  continue present plan and medications.  Follow up in 1 year with Dr Hochrein.  You will receive a letter in the mail 2 months before you are due.  Please call us when you receive this letter to schedule your follow up appointment.  

## 2014-09-15 NOTE — Progress Notes (Signed)
HPI The patient presents for follow of MVR and atrial fib.   S;ince I last saw him he has had laparotomy for treatment of lymphoma and a second surgery for repair of a hernia.   He has had chemo and his lymphoma is now in remission.  He has done well from a cardiovascular standpoint.  He is being active and says that he feels the best he has felt in three years. The patient denies any new symptoms such as chest discomfort, neck or arm discomfort. There has been no new shortness of breath, PND or orthopnea. There have been no reported palpitations, presyncope or syncope.  No Known Allergies  Current Outpatient Prescriptions  Medication Sig Dispense Refill  . amLODipine (NORVASC) 5 MG tablet Take 5 mg by mouth daily.    Marland Kitchen aspirin 81 MG chewable tablet Chew 81 mg by mouth daily.    Marland Kitchen BAYER BREEZE 2 TEST DISK   5  . Clobetasol Prop Emollient Base (CLOBETASOL PROPIONATE E) 0.05 % emollient cream Apply 1 application topically 2 (two) times daily as needed. To affected areas    . digoxin (LANOXIN) 0.25 MG tablet Take 0.25 mg by mouth every morning.     . Enoxaparin Sodium (LOVENOX IJ) Inject 1 each as directed every 12 (twelve) hours.    Marland Kitchen esomeprazole (NEXIUM) 40 MG capsule Take 40 mg by mouth at bedtime.     Marland Kitchen ezetimibe-simvastatin (VYTORIN) 10-40 MG per tablet Take 1 tablet by mouth at bedtime.    . furosemide (LASIX) 40 MG tablet Take 40 mg by mouth daily.     Marland Kitchen glipiZIDE (GLUCOTROL XL) 10 MG 24 hr tablet Take 10 mg by mouth daily.     Marland Kitchen HYDROcodone-acetaminophen (NORCO) 5-325 MG per tablet Take 1 tablet by mouth every 6 (six) hours as needed for pain. 30 tablet 0  . insulin glargine (LANTUS) 100 UNIT/ML injection Inject 75 Units into the skin 2 (two) times daily.    . metFORMIN (GLUCOPHAGE) 1000 MG tablet Take 1,000 mg by mouth 2 (two) times daily.     . metoprolol (TOPROL-XL) 200 MG 24 hr tablet Take 200 mg by mouth daily after breakfast.     . Misc Natural Products (OSTEO BI-FLEX ADV TRIPLE  ST) TABS Take 500 each by mouth daily as needed (joint pain).     . nitroGLYCERIN (NITROSTAT) 0.4 MG SL tablet Place 0.4 mg under the tongue every 5 (five) minutes as needed for chest pain.    . Omega-3 Fatty Acids (FISH OIL) 1000 MG CAPS Take 1 capsule by mouth 2 (two) times daily.     . potassium chloride (K-DUR) 10 MEQ tablet Take 10 mEq by mouth.    . potassium chloride SA (K-DUR,KLOR-CON) 20 MEQ tablet Take 10 mEq by mouth 3 (three) times a week. Takes Mondays, Wednesdays, and Saturdays.    . ramipril (ALTACE) 10 MG capsule Take 10 mg by mouth 2 (two) times daily.    . sitaGLIPtin (JANUVIA) 100 MG tablet Take 100 mg by mouth daily.     Marland Kitchen warfarin (COUMADIN) 5 MG tablet Take 5 mg by mouth daily.      No current facility-administered medications for this visit.    Past Medical History  Diagnosis Date  . HLD (hyperlipidemia)     takes Vytorin daily  . Rheumatic fever ~ 1965  . CAD (coronary artery disease)     a. s/p CABG 11/2000 (L-LAD, S-RCA);  b. Lexiscan Myoview 11/12: EF 45%, ischemia and  possibly some scar at the base of the inferolateral wall;   c. Lex MV 11/13:  EF 44%, Inf and IL scar/soft tissue atten, small mild amt of inf ischemia,  . Atrial fibrillation     takes Coumadin daily  . Rheumatic heart disease     a. s/p mechanical (St. Jude) MVR 2002;  b. Echo 5/11: Mild LVH, EF 45-50%, mild AS, mild AI, mean gradient 11 mmHg, MVR with normal gradients, moderate LAE, mild RAE;  c.  Echo 11/13:   mod LVH, EF 55-60%, mild AS (mean 18 mmHg), mild AI, severe LAE, mild RAE, PASP 41  . 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 Arthritis     back   . HTN (hypertension)     takes Amlodipine,Metoprolol, and Ramipril daily  . Constipation     takes Colace daily  . Peripheral edema     takes Furosemide daily  . SOB (shortness of breath)     with exertion  . Pneumonia 09/2013  . Flu 09/2013  . Peripheral neuropathy   . Bruises easily     takes Coumadin daily  . GERD  (gastroesophageal reflux disease)     takes Nexium daily  . History of colon polyps   . Nocturia   . Enlarged prostate   . History of blood transfusion     "related to cancer"  . Glaucoma   . Insomnia     states he has always been like this.But doesn't take any meds  . Kidney stones     "I passed them all"  . Type II diabetes mellitus     takes Januvia,Glipizide,and Metformin daily as well as Lantus  . Anemia     "lost blood w/the cancer"  . Small bowel cancer 2014    Past Surgical History  Procedure Laterality Date  . Mitral valve replacement  11/2000    #33 St. Jude mechanical mitral valve prosthesis. Dr. Servando Snare  . Laparotomy N/A 02/09/2013    Procedure: EXPLORATORY LAPAROTOMY WITH incisional biopsy intra- ABDOMINAL MASS ILOCECTOMY ;  Surgeon: Harl Bowie, MD;  Location: [facility];  Service: General;  Laterality: N/A;  . Portacath placement N/A 02/09/2013    Procedure: INSERTION PORT-A-CATH;  Surgeon: Harl Bowie, MD;  Location: [facility];  Service: General;  Laterality: N/A;  . Coronary artery bypass graft  2002    x 3  . Septoplasty    . Application of wound vac  2014  . Colonoscopy    . Esophagogastroduodenoscopy    . Laparoscopic incisional / umbilical / ventral hernia repair  03/15/2014    [facility]  . Port-a-cath removal  03/15/2014  . Hernia repair    . Cardiac catheterization  2002  . Incisional hernia repair N/A 03/15/2014    Procedure: LAPAROSCOPIC INCISIONAL HERNIA;  Surgeon: Harl Bowie, MD;  Location: [place];  Service: General;  Laterality: N/A;  . Port-a-cath removal Left 03/15/2014    Procedure: REMOVAL PORT-A-CATH;  Surgeon: Harl Bowie, MD;  Location: [place];  Service: General;  Laterality: Left;  . Insertion of mesh N/A 03/15/2014    Procedure: INSERTION OF MESH;  Surgeon: Harl Bowie, MD;  Location: [place];  Service: General;  Laterality: N/A;    ROS:  As stated in the HPI and negative for all other systems.  PHYSICAL EXAM BP 118/62 mmHg   Pulse 62  Ht 6' (1.829 m)  Wt 234 lb (106.142 kg)  BMI 31.73 kg/m2 GENERAL:  Well appearing  HEENT:  Pupils equal round and reactive, fundi not visualized, oral mucosa unremarkable NECK:  No jugular venous distention, waveform within normal limits, carotid upstroke brisk and symmetric, no bruits, no thyromegaly LUNGS:  Clear to auscultation bilaterally BACK:  No CVA tenderness CHEST:  Well healed sternotomy scar, porta cath.  HEART:  PMI not displaced or sustained, mechanical S1,  S2 within normal limits, no S3 , no clicks, no rubs, no murmurs, irregular ABD:  Flat, positive bowel sounds normal in frequency in pitch, no bruits, no rebound, no guarding, no midline pulsatile mass, no hepatomegaly, no splenomegaly, obese EXT:  2 plus pulses throughout,  Mild right leg edema, no cyanosis no clubbing SKIN:  No rashes no nodules,  Large healing wound on his right leg   EKG: Atrial fibrillation, rate 60 right axis deviation, RBBB, no acute ST-T wave changes. 09/15/2014   ASSESSMENT AND PLAN  CAD - The patient has had no new symptoms since his last stress test.  No change in therapy is indicated.   ATRIAL FIBRILLATION - The patient  tolerates this rhythm and rate control and anticoagulation. We will continue with the meds as listed.  MECHANICAL MVR -  He has had normal function and tolerates anticoagulation.  No further imaging is indicated.

## 2015-01-18 ENCOUNTER — Ambulatory Visit (HOSPITAL_BASED_OUTPATIENT_CLINIC_OR_DEPARTMENT_OTHER): Payer: BLUE CROSS/BLUE SHIELD | Admitting: Oncology

## 2015-01-18 ENCOUNTER — Telehealth: Payer: Self-pay | Admitting: Oncology

## 2015-01-18 ENCOUNTER — Other Ambulatory Visit (HOSPITAL_BASED_OUTPATIENT_CLINIC_OR_DEPARTMENT_OTHER): Payer: BLUE CROSS/BLUE SHIELD

## 2015-01-18 VITALS — BP 168/64 | HR 61 | Temp 97.7°F | Resp 19 | Ht 72.0 in | Wt 238.8 lb

## 2015-01-18 DIAGNOSIS — C8590 Non-Hodgkin lymphoma, unspecified, unspecified site: Secondary | ICD-10-CM

## 2015-01-18 DIAGNOSIS — C8333 Diffuse large B-cell lymphoma, intra-abdominal lymph nodes: Secondary | ICD-10-CM

## 2015-01-18 DIAGNOSIS — D649 Anemia, unspecified: Secondary | ICD-10-CM

## 2015-01-18 LAB — CBC WITH DIFFERENTIAL/PLATELET
BASO%: 1.3 % (ref 0.0–2.0)
Basophils Absolute: 0.1 10*3/uL (ref 0.0–0.1)
EOS%: 3.6 % (ref 0.0–7.0)
Eosinophils Absolute: 0.2 10*3/uL (ref 0.0–0.5)
HCT: 34.8 % — ABNORMAL LOW (ref 38.4–49.9)
HGB: 11.1 g/dL — ABNORMAL LOW (ref 13.0–17.1)
LYMPH%: 14.3 % (ref 14.0–49.0)
MCH: 25.7 pg — ABNORMAL LOW (ref 27.2–33.4)
MCHC: 32.1 g/dL (ref 32.0–36.0)
MCV: 80 fL (ref 79.3–98.0)
MONO#: 0.8 10*3/uL (ref 0.1–0.9)
MONO%: 12.4 % (ref 0.0–14.0)
NEUT#: 4.5 10*3/uL (ref 1.5–6.5)
NEUT%: 68.4 % (ref 39.0–75.0)
Platelets: 207 10*3/uL (ref 140–400)
RBC: 4.34 10*6/uL (ref 4.20–5.82)
RDW: 18.1 % — ABNORMAL HIGH (ref 11.0–14.6)
WBC: 6.5 10*3/uL (ref 4.0–10.3)
lymph#: 0.9 10*3/uL (ref 0.9–3.3)

## 2015-01-18 LAB — COMPREHENSIVE METABOLIC PANEL (CC13)
ALT: 22 U/L (ref 0–55)
AST: 23 U/L (ref 5–34)
Albumin: 3.8 g/dL (ref 3.5–5.0)
Alkaline Phosphatase: 123 U/L (ref 40–150)
Anion Gap: 11 mEq/L (ref 3–11)
BUN: 44 mg/dL — ABNORMAL HIGH (ref 7.0–26.0)
CO2: 23 mEq/L (ref 22–29)
Calcium: 8.9 mg/dL (ref 8.4–10.4)
Chloride: 107 mEq/L (ref 98–109)
Creatinine: 1.7 mg/dL — ABNORMAL HIGH (ref 0.7–1.3)
EGFR: 43 mL/min/{1.73_m2} — ABNORMAL LOW (ref >90)
Glucose: 166 mg/dl — ABNORMAL HIGH (ref 70–140)
Potassium: 5.4 mEq/L — ABNORMAL HIGH (ref 3.5–5.1)
Sodium: 141 mEq/L (ref 136–145)
Total Bilirubin: 0.41 mg/dL (ref 0.20–1.20)
Total Protein: 6.7 g/dL (ref 6.4–8.3)

## 2015-01-18 NOTE — Progress Notes (Signed)
Hematology and Oncology Follow Up Visit  John [name] [id number] 1950/10/26 64 y.o. 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   Prior Therapy: He is status post exploratory laparotomy and excision of an intra-abdominal mass and ileocolectomy in a Port-A-Cath placement on 02/09/2013 Chemotherapy with CHOP-R cycle one given on 04/16/2013. He is status post 4 cycles of chemotherapy completed in 06/2013.  Current therapy:  Observation and surveillance.  Interim History:  John [name] presents today for a followup visit with his wife. Since the last visit, he notes no new complaints. He is trying to lose weight and attempting to exercise regularly. He is modifying his dietary habits although he still gaining weight at this time. He is not reporting any abdominal pain or discomfort. He  reports no early satiety or change in his bowel habits. He denies fevers but reports some mild occasional shortness of breath with activity but is able to perform his activities of daily living without difficulty.He denies any problems with nausea or vomiting. He did not report any lymphadenopathy or easy bruisability. Has not reported any petechiae or bleeding. Has not reported a decline his energy her performance status. His Mediport in headaches or blurred vision or syncope. He does not report any frequency urgency or hesitancy. Rest of his review of systems unremarkable.  Medications: I have reviewed the patient's current medications.  Current Outpatient Prescriptions  Medication Sig Dispense Refill  . amLODipine (NORVASC) 5 MG tablet Take 5 mg by mouth daily.    Marland Kitchen aspirin 81 MG chewable tablet Chew 81 mg by mouth daily.    Marland Kitchen BAYER BREEZE 2 TEST DISK   5  . Clobetasol Prop Emollient  Base (CLOBETASOL PROPIONATE E) 0.05 % emollient cream Apply 1 application topically 2 (two) times daily as needed. To affected areas    . digoxin (LANOXIN) 0.25 MG tablet Take 0.25 mg by mouth every morning.     . Enoxaparin Sodium (LOVENOX IJ) Inject 1 each as directed every 12 (twelve) hours.    Marland Kitchen esomeprazole (NEXIUM) 40 MG capsule Take 40 mg by mouth at bedtime.     Marland Kitchen ezetimibe-simvastatin (VYTORIN) 10-40 MG per tablet Take 1 tablet by mouth at bedtime.    . furosemide (LASIX) 40 MG tablet Take 40 mg by mouth daily.     Marland Kitchen glipiZIDE (GLUCOTROL XL) 10 MG 24 hr tablet Take 10 mg by mouth daily.     Marland Kitchen HYDROcodone-acetaminophen (NORCO) 5-325 MG per tablet Take 1 tablet by mouth every 6 (six) hours as needed for pain. 30 tablet 0  . insulin glargine (LANTUS) 100 UNIT/ML injection Inject 75 Units into the skin 2 (two) times daily.    . metFORMIN (GLUCOPHAGE) 1000 MG tablet Take 1,000 mg by mouth 2 (two) times daily.     . metoprolol (TOPROL-XL) 200 MG 24 hr tablet Take 200 mg by mouth daily after breakfast.     . Misc Natural Products (OSTEO BI-FLEX ADV TRIPLE ST) TABS Take 500 each by mouth daily as needed (joint pain).     . nitroGLYCERIN (NITROSTAT) 0.4 MG SL tablet Place 0.4 mg under the tongue every 5 (five) minutes as needed for chest pain.    . Omega-3 Fatty Acids (FISH OIL) 1000 MG CAPS Take 1 capsule by mouth 2 (two) times daily.     Marland Kitchen  potassium chloride (K-DUR) 10 MEQ tablet Take 10 mEq by mouth.    . potassium chloride SA (K-DUR,KLOR-CON) 20 MEQ tablet Take 10 mEq by mouth 3 (three) times a week. Takes Mondays, Wednesdays, and Saturdays.    . ramipril (ALTACE) 10 MG capsule Take 10 mg by mouth 2 (two) times daily.    . sitaGLIPtin (JANUVIA) 100 MG tablet Take 100 mg by mouth daily.     Marland Kitchen warfarin (COUMADIN) 5 MG tablet Take 5 mg by mouth daily.      No current facility-administered medications for this visit.     Allergies: No Known Allergies  Past Medical History, Surgical history,  Social history, and Family History were reviewed and updated.    Physical Exam: Blood pressure 168/64, pulse 61, temperature 97.7 F (36.5 C), temperature source Oral, resp. rate 19, height 6' (1.829 m), weight 238 lb 12.8 oz (108.319 kg), SpO2 98 %. ECOG: 1 General appearance: alert awake not in any distress. Head: Normocephalic, without obvious abnormality Neck: no adenopathy Lymph nodes: Cervical, supraclavicular, and axillary nodes normal. Heart: Bradycardic and have regular. Lung:chest clear, no wheezing, rales, normal symmetric air entry Abdomen: soft, non-tender, without masses or organomegaly EXT:no erythema, induration, or nodules Abdominal wound completely healed.   Lab Results: Lab Results  Component Value Date   WBC 6.5 01/18/2015   HGB 11.1* 01/18/2015   HCT 34.8* 01/18/2015   MCV 80.0 01/18/2015   PLT 207 01/18/2015     Chemistry      Component Value Date/Time   NA 140 08/17/2014 0759   NA 134* 03/17/2014 0010   K 5.1 08/17/2014 0759   K 5.8* 03/17/2014 0010   CL 97 03/17/2014 0010   CL 106 01/21/2013 1050   CO2 26 08/17/2014 0759   CO2 24 03/17/2014 0010   BUN 46.2* 08/17/2014 0759   BUN 39* 03/17/2014 0010   CREATININE 1.3 08/17/2014 0759   CREATININE 1.85* 03/17/2014 0010      Component Value Date/Time   CALCIUM 9.5 08/17/2014 0759   CALCIUM 8.5 03/17/2014 0010   ALKPHOS 131 08/17/2014 0759   AST 21 08/17/2014 0759   ALT 20 08/17/2014 0759   BILITOT 0.51 08/17/2014 0759       Impression and Plan: 64 year old gentleman with the following issues:  1. 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 He is status post excisional biopsy and ileocolectomy followed by systemic chemotherapy. CT scan results from 08/17/2014 showed no evidence of disease relapse. The plan is to continue with observation and surveillance and repeat imaging studies in October 2016. He has no clinical signs of relapse  and no need to do the scan sooner at this time.   2. Anemia: Probably multifactorial in nature related to chronic disease and previous chemotherapy. His hemoglobin continued to improve at this time.   3. Abdominal wound: Completely healed at this time.   4. IV access: PAC as were removed without any complications.  5. Followup: Will be in 5 months for a clinical visit and a CT scan.     [id number] 5/10/20168:31 AM

## 2015-01-18 NOTE — Telephone Encounter (Signed)
Pt confirmed labs/ov per 05/10 POF, gave pt AVS and Calendar.... KJ  °

## 2015-06-20 ENCOUNTER — Telehealth: Payer: Self-pay

## 2015-06-20 NOTE — Telephone Encounter (Signed)
Wife called asking about holding metformin, confirmed with CT hold metformin for 48 hours after CT. Called CT about NPO status and Otila Kluver stated she would call wife.

## 2015-06-21 ENCOUNTER — Encounter (HOSPITAL_COMMUNITY): Payer: Self-pay

## 2015-06-21 ENCOUNTER — Other Ambulatory Visit (HOSPITAL_BASED_OUTPATIENT_CLINIC_OR_DEPARTMENT_OTHER): Payer: BLUE CROSS/BLUE SHIELD

## 2015-06-21 ENCOUNTER — Ambulatory Visit (HOSPITAL_COMMUNITY)
Admission: RE | Admit: 2015-06-21 | Discharge: 2015-06-21 | Disposition: A | Discharge status: Home / Self Care | Payer: BLUE CROSS/BLUE SHIELD | Source: Ambulatory Visit | Attending: Oncology | Admitting: Oncology

## 2015-06-21 DIAGNOSIS — C8338 Diffuse large B-cell lymphoma, lymph nodes of multiple sites: Secondary | ICD-10-CM | POA: Diagnosis not present

## 2015-06-21 DIAGNOSIS — D649 Anemia, unspecified: Secondary | ICD-10-CM

## 2015-06-21 DIAGNOSIS — I7090 Unspecified atherosclerosis: Secondary | ICD-10-CM | POA: Insufficient documentation

## 2015-06-21 DIAGNOSIS — C8590 Non-Hodgkin lymphoma, unspecified, unspecified site: Secondary | ICD-10-CM

## 2015-06-21 LAB — COMPREHENSIVE METABOLIC PANEL (CC13)
ALT: 16 U/L (ref 0–55)
AST: 17 U/L (ref 5–34)
Albumin: 3.7 g/dL (ref 3.5–5.0)
Alkaline Phosphatase: 125 U/L (ref 40–150)
Anion Gap: 9 mEq/L (ref 3–11)
BUN: 50.4 mg/dL — ABNORMAL HIGH (ref 7.0–26.0)
CO2: 26 mEq/L (ref 22–29)
Calcium: 9.6 mg/dL (ref 8.4–10.4)
Chloride: 108 mEq/L (ref 98–109)
Creatinine: 1.4 mg/dL — ABNORMAL HIGH (ref 0.7–1.3)
EGFR: 55 mL/min/{1.73_m2} — ABNORMAL LOW (ref >90)
Glucose: 148 mg/dl — ABNORMAL HIGH (ref 70–140)
Potassium: 5.8 mEq/L — ABNORMAL HIGH (ref 3.5–5.1)
Sodium: 143 mEq/L (ref 136–145)
Total Bilirubin: 0.43 mg/dL (ref 0.20–1.20)
Total Protein: 6.9 g/dL (ref 6.4–8.3)

## 2015-06-21 LAB — CBC WITH DIFFERENTIAL/PLATELET
BASO%: 1.7 % (ref 0.0–2.0)
Basophils Absolute: 0.1 10*3/uL (ref 0.0–0.1)
EOS%: 6.4 % (ref 0.0–7.0)
Eosinophils Absolute: 0.4 10*3/uL (ref 0.0–0.5)
HCT: 36.1 % — ABNORMAL LOW (ref 38.4–49.9)
HGB: 11.6 g/dL — ABNORMAL LOW (ref 13.0–17.1)
LYMPH%: 15.2 % (ref 14.0–49.0)
MCH: 26.8 pg — ABNORMAL LOW (ref 27.2–33.4)
MCHC: 32 g/dL (ref 32.0–36.0)
MCV: 83.7 fL (ref 79.3–98.0)
MONO#: 0.9 10*3/uL (ref 0.1–0.9)
MONO%: 13.5 % (ref 0.0–14.0)
NEUT#: 4.1 10*3/uL (ref 1.5–6.5)
NEUT%: 63.2 % (ref 39.0–75.0)
Platelets: 243 10*3/uL (ref 140–400)
RBC: 4.31 10*6/uL (ref 4.20–5.82)
RDW: 16.8 % — ABNORMAL HIGH (ref 11.0–14.6)
WBC: 6.4 10*3/uL (ref 4.0–10.3)
lymph#: 1 10*3/uL (ref 0.9–3.3)

## 2015-06-21 MED ORDER — IOHEXOL 300 MG/ML  SOLN
100.0000 mL | Freq: Once | INTRAMUSCULAR | Status: AC | PRN
  Administered 2015-06-21: 100 mL via INTRAVENOUS

## 2015-06-23 ENCOUNTER — Telehealth: Payer: Self-pay | Admitting: Oncology

## 2015-06-23 ENCOUNTER — Ambulatory Visit (HOSPITAL_BASED_OUTPATIENT_CLINIC_OR_DEPARTMENT_OTHER): Payer: BLUE CROSS/BLUE SHIELD | Admitting: Oncology

## 2015-06-23 VITALS — BP 127/96 | HR 98 | Temp 98.0°F | Resp 18 | Ht 72.0 in | Wt 224.7 lb

## 2015-06-23 DIAGNOSIS — E119 Type 2 diabetes mellitus without complications: Secondary | ICD-10-CM

## 2015-06-23 DIAGNOSIS — I129 Hypertensive chronic kidney disease with stage 1 through stage 4 chronic kidney disease, or unspecified chronic kidney disease: Secondary | ICD-10-CM | POA: Diagnosis not present

## 2015-06-23 DIAGNOSIS — Z8572 Personal history of non-Hodgkin lymphomas: Secondary | ICD-10-CM | POA: Diagnosis not present

## 2015-06-23 DIAGNOSIS — D649 Anemia, unspecified: Secondary | ICD-10-CM | POA: Diagnosis not present

## 2015-06-23 DIAGNOSIS — C8253 Diffuse follicle center lymphoma, intra-abdominal lymph nodes: Secondary | ICD-10-CM

## 2015-06-23 NOTE — Progress Notes (Signed)
Hematology and Oncology Follow Up Visit  John [name] [id number] 09/08/1951 64 y.o. 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   Prior Therapy: He is status post exploratory laparotomy and excision of an intra-abdominal mass and ileocolectomy in a Port-A-Cath placement on 02/09/2013 Chemotherapy with CHOP-R cycle one given on 04/16/2013. He is status post 4 cycles of chemotherapy completed in 06/2013.  Current therapy:  Observation and surveillance.  Interim History:  John [name] presents today for a followup visit by himself. Since the last visit, he is doing well. He is not reporting any abdominal pain or discomfort. He  reports no early satiety or change in his bowel habits. He denies fevers, chills, sweats or other constitutional symptoms. He was diagnosed with a respiratory tract infection but have resolved at this time. He performs activities of daily living without any decline. His cardiac medication was changed due to bradycardia and alternatives has been discontinued.   He does not report any headaches, blurry vision, syncope or seizures. He denies any problems with nausea or vomiting. He did not report any lymphadenopathy or easy bruisability. Has not reported any petechiae or bleeding. He does not report any chest pain, palpitation orthopnea. Does not report any cough or hemoptysis. He does not report any frequency urgency or hesitancy. Rest of his review of systems unremarkable.  Medications: I have reviewed the patient's current medications.  Current Outpatient Prescriptions  Medication Sig Dispense Refill  . amLODipine (NORVASC) 5 MG tablet Take 5 mg by mouth daily.    Marland Kitchen aspirin 81 MG chewable tablet Chew 81 mg by mouth daily.    Marland Kitchen BAYER BREEZE 2  TEST DISK   5  . Clobetasol Prop Emollient Base (CLOBETASOL PROPIONATE E) 0.05 % emollient cream Apply 1 application topically 2 (two) times daily as needed. To affected areas    . digoxin (LANOXIN) 0.25 MG tablet Take 0.25 mg by mouth every morning.     . Enoxaparin Sodium (LOVENOX IJ) Inject 1 each as directed every 12 (twelve) hours.    Marland Kitchen esomeprazole (NEXIUM) 40 MG capsule Take 40 mg by mouth at bedtime.     Marland Kitchen ezetimibe-simvastatin (VYTORIN) 10-40 MG per tablet Take 1 tablet by mouth at bedtime.    . furosemide (LASIX) 40 MG tablet Take 40 mg by mouth daily.     Marland Kitchen glipiZIDE (GLUCOTROL XL) 10 MG 24 hr tablet Take 10 mg by mouth daily.     Marland Kitchen HYDROcodone-acetaminophen (NORCO) 5-325 MG per tablet Take 1 tablet by mouth every 6 (six) hours as needed for pain. 30 tablet 0  . insulin glargine (LANTUS) 100 UNIT/ML injection Inject 75 Units into the skin 2 (two) times daily.    . metFORMIN (GLUCOPHAGE) 1000 MG tablet Take 1,000 mg by mouth 2 (two) times daily.     . metoprolol (TOPROL-XL) 200 MG 24 hr tablet Take 200 mg by mouth daily after breakfast.     . Misc Natural Products (OSTEO BI-FLEX ADV TRIPLE ST) TABS Take 500 each by mouth daily as needed (joint pain).     . nitroGLYCERIN (NITROSTAT) 0.4 MG SL tablet Place 0.4 mg under the tongue every 5 (five) minutes as needed for chest pain.    . Omega-3 Fatty Acids (FISH OIL) 1000 MG CAPS Take 1 capsule by mouth  2 (two) times daily.     . potassium chloride (K-DUR) 10 MEQ tablet Take 10 mEq by mouth.    . potassium chloride SA (K-DUR,KLOR-CON) 20 MEQ tablet Take 10 mEq by mouth 3 (three) times a week. Takes Mondays, Wednesdays, and Saturdays.    . ramipril (ALTACE) 10 MG capsule Take 10 mg by mouth 2 (two) times daily.    . sitaGLIPtin (JANUVIA) 100 MG tablet Take 100 mg by mouth daily.     Marland Kitchen warfarin (COUMADIN) 5 MG tablet Take 5 mg by mouth daily.      No current facility-administered medications for this visit.     Allergies: No Known  Allergies  Past Medical History, Surgical history, Social history, and Family History were reviewed and updated.    Physical Exam: Blood pressure 127/96, pulse 98, temperature 98 F (36.7 C), temperature source Oral, resp. rate 18, height 6' (1.829 m), weight 224 lb 11.2 oz (101.923 kg), SpO2 98 %. ECOG: 1 General appearance: alert awake gentleman without distress. Head: Normocephalic, without obvious abnormality no oral ulcers or lesions. Neck: no adenopathy Lymph nodes: Cervical, supraclavicular, and axillary nodes normal. Heart: Bradycardic and have regular. Chest wall examination revealed Port-A-Cath site to be normal without drainage. Lung:chest clear, no wheezing, rales, normal symmetric air entry Abdomen: soft, non-tender, without masses or organomegaly old abdominal incision noted. EXT:no erythema, induration, or nodules   Lab Results: Lab Results  Component Value Date   WBC 6.4 06/21/2015   HGB 11.6* 06/21/2015   HCT 36.1* 06/21/2015   MCV 83.7 06/21/2015   PLT 243 06/21/2015     Chemistry      Component Value Date/Time   NA 143 06/21/2015 0757   NA 134* 03/17/2014 0010   K 5.8* 06/21/2015 0757   K 5.8* 03/17/2014 0010   CL 97 03/17/2014 0010   CL 106 01/21/2013 1050   CO2 26 06/21/2015 0757   CO2 24 03/17/2014 0010   BUN 50.4* 06/21/2015 0757   BUN 39* 03/17/2014 0010   CREATININE 1.4* 06/21/2015 0757   CREATININE 1.85* 03/17/2014 0010      Component Value Date/Time   CALCIUM 9.6 06/21/2015 0757   CALCIUM 8.5 03/17/2014 0010   ALKPHOS 125 06/21/2015 0757   AST 17 06/21/2015 0757   ALT 16 06/21/2015 0757   BILITOT 0.43 06/21/2015 0757      EXAM: CT CHEST, ABDOMEN, AND PELVIS WITH CONTRAST  TECHNIQUE: Multidetector CT imaging of the chest, abdomen and pelvis was performed following the standard protocol during bolus administration of intravenous contrast.  CONTRAST: 147mL OMNIPAQUE IOHEXOL 300 MG/ML SOLN  COMPARISON:  08/17/2014  FINDINGS: CT CHEST FINDINGS  Mediastinum/Nodes: No supraclavicular adenopathy. No axillary adenopathy. Bovine arch. Aortic and branch vessel atherosclerosis. Prior median sternotomy. Mild cardiomegaly with prior mitral valve repair. No central pulmonary embolism, on this non-dedicated study. No mediastinal or hilar adenopathy.  Lungs/Pleura: No pleural fluid. Subpleural nodularity in the right lower lobe is unchanged on image/series 45/4. Favored to represent an area of scarring. Clear left lung.  Musculoskeletal: No acute osseous abnormality.  CT ABDOMEN PELVIS FINDINGS  Hepatobiliary: Too small to characterize right hepatic lobe low-density lesion is similar. Mild nonspecific caudate lobe enlargement. Normal gallbladder, without biliary ductal dilatation.  Pancreas: Mild pancreatic atrophy, without duct dilatation or mass.  Spleen: Normal in size, without focal abnormality.  Adrenals/Urinary Tract: Normal adrenal glands. Lower pole left renal collecting system calculus versus vascular calcification. Normal right kidney, without hydronephrosis. Normal urinary bladder.  Stomach/Bowel: Normal stomach,  without wall thickening. Colonic stool burden suggests constipation. Small bowel positioned within an area of ventral anterior wall laxity.  Vascular/Lymphatic: Advanced aortic and branch vessel atherosclerosis. No abdominopelvic adenopathy.  Reproductive: Mild prostatomegaly.  Other: No significant free fluid. No evidence of omental or peritoneal disease. Small bilateral fat containing inguinal hernias.  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 S-shaped thoracolumbar spine curvature.  IMPRESSION: 1. No acute process or evidence of active lymphoma. 2. Possible constipation. 3. Advanced atherosclerosis. 4. Bilateral femoral head avascular  necrosis.   Impression and Plan: 64 year old gentleman with the following issues:  1. 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 He is status post excisional biopsy and ileocolectomy followed by systemic chemotherapy. Imaging studies following that showed a complete response and have been in remission since October 2014.  CT scan and laboratory data obtain on 06/21/2015 were personally reviewed and discussed with the patient. He continues to show no evidence of disease relapse. The plan is to continue with active surveillance and repeat imaging studies in 12 months.   2. Anemia: Probably multifactorial in nature related to chronic disease with hemoglobin close to baseline. He is asymptomatic at this time and does not require intervention. We will continue to monitor this moving forward.   3. Chronic renal insufficiency: Likely related to long-standing hypertension and diabetes with creatinine close to baseline.  4. IV access: PAC as were removed without any complications.  5. Followup: Will be in 6 months with repeat imaging studies in 12 months.     Mirra Basilio 10/13/20168:18 AM

## 2015-06-23 NOTE — Telephone Encounter (Signed)
Gave and printed appt sched and avs for pt for April 2017 °

## 2015-11-22 ENCOUNTER — Other Ambulatory Visit: Payer: Self-pay | Admitting: Gastroenterology

## 2015-12-22 ENCOUNTER — Ambulatory Visit (HOSPITAL_BASED_OUTPATIENT_CLINIC_OR_DEPARTMENT_OTHER): Payer: Medicare HMO | Admitting: Oncology

## 2015-12-22 ENCOUNTER — Other Ambulatory Visit (HOSPITAL_BASED_OUTPATIENT_CLINIC_OR_DEPARTMENT_OTHER): Payer: Medicare HMO

## 2015-12-22 ENCOUNTER — Telehealth: Payer: Self-pay | Admitting: Oncology

## 2015-12-22 VITALS — BP 164/51 | HR 51 | Temp 98.0°F | Resp 18 | Ht 72.0 in | Wt 229.3 lb

## 2015-12-22 DIAGNOSIS — D631 Anemia in chronic kidney disease: Secondary | ICD-10-CM

## 2015-12-22 DIAGNOSIS — D509 Iron deficiency anemia, unspecified: Secondary | ICD-10-CM

## 2015-12-22 DIAGNOSIS — N189 Chronic kidney disease, unspecified: Secondary | ICD-10-CM

## 2015-12-22 DIAGNOSIS — Z8572 Personal history of non-Hodgkin lymphomas: Secondary | ICD-10-CM

## 2015-12-22 DIAGNOSIS — C8299 Follicular lymphoma, unspecified, extranodal and solid organ sites: Secondary | ICD-10-CM

## 2015-12-22 DIAGNOSIS — C8253 Diffuse follicle center lymphoma, intra-abdominal lymph nodes: Secondary | ICD-10-CM

## 2015-12-22 LAB — CBC WITH DIFFERENTIAL/PLATELET
BASO%: 1 % (ref 0.0–2.0)
Basophils Absolute: 0.1 10*3/uL (ref 0.0–0.1)
EOS%: 4.7 % (ref 0.0–7.0)
Eosinophils Absolute: 0.4 10*3/uL (ref 0.0–0.5)
HCT: 42.5 % (ref 38.4–49.9)
HGB: 14.1 g/dL (ref 13.0–17.1)
LYMPH%: 18 % (ref 14.0–49.0)
MCH: 29.3 pg (ref 27.2–33.4)
MCHC: 33.2 g/dL (ref 32.0–36.0)
MCV: 88.4 fL (ref 79.3–98.0)
MONO#: 1 10*3/uL — ABNORMAL HIGH (ref 0.1–0.9)
MONO%: 12.6 % (ref 0.0–14.0)
NEUT#: 5 10*3/uL (ref 1.5–6.5)
NEUT%: 63.7 % (ref 39.0–75.0)
Platelets: 172 10*3/uL (ref 140–400)
RBC: 4.81 10*6/uL (ref 4.20–5.82)
RDW: 16.3 % — ABNORMAL HIGH (ref 11.0–14.6)
WBC: 7.9 10*3/uL (ref 4.0–10.3)
lymph#: 1.4 10*3/uL (ref 0.9–3.3)

## 2015-12-22 LAB — COMPREHENSIVE METABOLIC PANEL
ALT: 28 U/L (ref 0–55)
AST: 27 U/L (ref 5–34)
Albumin: 3.8 g/dL (ref 3.5–5.0)
Alkaline Phosphatase: 120 U/L (ref 40–150)
Anion Gap: 10 mEq/L (ref 3–11)
BUN: 45.4 mg/dL — ABNORMAL HIGH (ref 7.0–26.0)
CO2: 25 mEq/L (ref 22–29)
Calcium: 9.6 mg/dL (ref 8.4–10.4)
Chloride: 105 mEq/L (ref 98–109)
Creatinine: 1.4 mg/dL — ABNORMAL HIGH (ref 0.7–1.3)
EGFR: 53 mL/min/{1.73_m2} — ABNORMAL LOW (ref >90)
Glucose: 184 mg/dl — ABNORMAL HIGH (ref 70–140)
Potassium: 4.6 mEq/L (ref 3.5–5.1)
Sodium: 140 mEq/L (ref 136–145)
Total Bilirubin: 0.49 mg/dL (ref 0.20–1.20)
Total Protein: 7.5 g/dL (ref 6.4–8.3)

## 2015-12-22 NOTE — Progress Notes (Signed)
Hematology and Oncology Follow Up Visit  John [name] [id number] 28-Feb-1951 65 y.o. 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   Prior Therapy: He is status post exploratory laparotomy and excision of an intra-abdominal mass and ileocolectomy and a Port-A-Cath placement on 02/09/2013 Chemotherapy with CHOP-R cycle one given on 04/16/2013. He is status post 4 cycles of chemotherapy completed in 06/2013.  Current therapy:  Observation and surveillance.  Interim History:  John [name] presents today for a followup visit with his wife. Since the last visit, he reports no recent complaints. He was found to have Hemoccult positive stool and underwent colonoscopy and endoscopy which was unremarkable. He did have a polyp that was removed. He is not reporting any abdominal pain or discomfort. He  reports no early satiety or change in his bowel habits. He denies fevers, chills, sweats or other constitutional symptoms. He performs activities of daily living without any decline. He is able to work outside for extended period of time.    He does not report any headaches, blurry vision, syncope or seizures. He denies any problems with nausea or vomiting. He did not report any lymphadenopathy or easy bruisability. Has not reported any petechiae or bleeding. He does not report any chest pain, palpitation orthopnea. Does not report any cough or hemoptysis. He does not report any frequency urgency or hesitancy. Rest of his review of systems unremarkable.  Medications: I have reviewed the patient's current medications.  Current Outpatient Prescriptions  Medication Sig Dispense Refill  . amLODipine (NORVASC) 5 MG tablet Take 5 mg by mouth daily.    Marland Kitchen aspirin 81 MG chewable tablet Chew 81 mg by mouth daily.     Marland Kitchen BAYER BREEZE 2 TEST DISK   5  . Clobetasol Prop Emollient Base (CLOBETASOL PROPIONATE E) 0.05 % emollient cream Apply 1 application topically 2 (two) times daily as needed. To affected areas    . digoxin (LANOXIN) 0.25 MG tablet Take 0.25 mg by mouth every morning.     . Enoxaparin Sodium (LOVENOX IJ) Inject 1 each as directed every 12 (twelve) hours.    Marland Kitchen esomeprazole (NEXIUM) 40 MG capsule Take 40 mg by mouth at bedtime.     Marland Kitchen ezetimibe-simvastatin (VYTORIN) 10-40 MG per tablet Take 1 tablet by mouth at bedtime.    . furosemide (LASIX) 40 MG tablet Take 40 mg by mouth daily.     Marland Kitchen glipiZIDE (GLUCOTROL XL) 10 MG 24 hr tablet Take 10 mg by mouth daily.     Marland Kitchen HYDROcodone-acetaminophen (NORCO) 5-325 MG per tablet Take 1 tablet by mouth every 6 (six) hours as needed for pain. 30 tablet 0  . insulin glargine (LANTUS) 100 UNIT/ML injection Inject 75 Units into the skin 2 (two) times daily.    . metFORMIN (GLUCOPHAGE) 1000 MG tablet Take 1,000 mg by mouth 2 (two) times daily.     . metoprolol (TOPROL-XL) 200 MG 24 hr tablet Take 200 mg by mouth daily after breakfast.     . Misc Natural Products (OSTEO BI-FLEX ADV TRIPLE ST) TABS Take 500 each by mouth daily as needed (joint pain).     . nitroGLYCERIN (NITROSTAT) 0.4 MG SL tablet Place 0.4 mg under the tongue every 5 (five) minutes as needed for chest pain.    . Omega-3 Fatty Acids (FISH OIL) 1000  MG CAPS Take 1 capsule by mouth 2 (two) times daily.     . potassium chloride (K-DUR) 10 MEQ tablet Take 10 mEq by mouth.    . potassium chloride SA (K-DUR,KLOR-CON) 20 MEQ tablet Take 10 mEq by mouth 3 (three) times a week. Takes Mondays, Wednesdays, and Saturdays.    . sitaGLIPtin (JANUVIA) 100 MG tablet Take 100 mg by mouth daily.     Marland Kitchen warfarin (COUMADIN) 5 MG tablet Take 5 mg by mouth daily.      No current facility-administered medications for this visit.     Allergies: No Known Allergies  Past Medical History, Surgical history, Social history,  and Family History were reviewed and updated.    Physical Exam: Blood pressure 164/51, pulse 51, temperature 98 F (36.7 C), temperature source Oral, resp. rate 18, height 6' (1.829 m), weight 229 lb 4.8 oz (104.01 kg), SpO2 99 %. ECOG: 1 General appearance: alert, pleasant gentleman without distress.  Head: Normocephalic, without obvious abnormality no scleral icterus. Oral mucosa without ulcers or lesions. Neck: no adenopathy Lymph nodes: Cervical, supraclavicular, and axillary nodes normal. Heart: Bradycardic and have regular. Chest wall examination revealed Port-A-Cath site to be well-healed after Port-A-Cath removal.  Lung:chest clear, no wheezing, rales, normal symmetric air entry Abdomen: soft, non-tender, without masses or organomegaly no rebound or guarding.  EXT:no erythema, induration, or nodules   Lab Results: Lab Results  Component Value Date   WBC 7.9 12/22/2015   HGB 14.1 12/22/2015   HCT 42.5 12/22/2015   MCV 88.4 12/22/2015   PLT 172 12/22/2015     Chemistry      Component Value Date/Time   NA 143 06/21/2015 0757   NA 134* 03/17/2014 0010   K 5.8* 06/21/2015 0757   K 5.8* 03/17/2014 0010   CL 97 03/17/2014 0010   CL 106 01/21/2013 1050   CO2 26 06/21/2015 0757   CO2 24 03/17/2014 0010   BUN 50.4* 06/21/2015 0757   BUN 39* 03/17/2014 0010   CREATININE 1.4* 06/21/2015 0757   CREATININE 1.85* 03/17/2014 0010      Component Value Date/Time   CALCIUM 9.6 06/21/2015 0757   CALCIUM 8.5 03/17/2014 0010   ALKPHOS 125 06/21/2015 0757   AST 17 06/21/2015 0757   ALT 16 06/21/2015 0757   BILITOT 0.43 06/21/2015 0757     Impression and Plan: 65 year old gentleman with the following issues:  1. 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 He is status post excisional biopsy and ileocolectomy followed by systemic chemotherapy. Imaging studies following that showed a complete response and have been in  remission since October 2014.  CT scan on 06/21/2015 continues to show no evidence of disease relapse.   His physical examination and laboratory data were reviewed today and showed no evidence to suggest recurrent disease. The plan is to continue with active surveillance at this time with clinical visit and laboratory testing in 6 months. We'll repeat imaging studies as needed. Develops any symptoms.    2. Anemia:  could be related to iron deficiency and renal insufficiency. His hemoglobin is much better at this time after taking iron supplements.   3. Chronic renal insufficiency: Likely related to long-standing hypertension and diabetes with creatinine close to baseline.  4. IV access: PAC as were removed without any complications.  5. Followup: Will be in 6 months.      SHADAD,FIRAS 4/13/20178:16 AM

## 2015-12-22 NOTE — Telephone Encounter (Signed)
Gave and printed appt sched and avs fo rpt for OCT °

## 2016-06-04 ENCOUNTER — Ambulatory Visit: Payer: Medicare HMO | Attending: Family Medicine | Admitting: Physical Therapy

## 2016-06-04 DIAGNOSIS — M545 Low back pain, unspecified: Secondary | ICD-10-CM

## 2016-06-04 NOTE — Therapy (Signed)
[facility] [place], [place], [address] Phone: [phone]   Fax:  [phone]  Physical Therapy Evaluation  Patient Details  Name: John [name] MRN: [id number] Date of Birth: 05-28-1951 Referring Provider: Dione Housekeeper MD.  Encounter Date: 06/04/2016      PT End of Session - 06/04/16 1214    Visit Number 1   Number of Visits 8   Date for PT Re-Evaluation 07/02/16   PT Start Time 0908   PT Stop Time 0955   PT Time Calculation (min) 47 min   Behavior During Therapy [facility] for tasks assessed/performed      Past Medical History:  Diagnosis Date  . Anemia    "lost blood w/the cancer"  . Arthritis    back   . Atrial fibrillation ([place])    takes Coumadin daily  . Bruises easily    takes Coumadin daily  . CAD (coronary artery disease)    a. s/p CABG 11/2000 (L-LAD, S-RCA);  b. Lexiscan Myoview 11/12: EF 45%, ischemia and possibly some scar at the base of the inferolateral wall;   c. Lex MV 11/13:  EF 44%, Inf and IL scar/soft tissue atten, small mild amt of inf ischemia,  . Constipation    takes Colace daily  . Enlarged prostate   . Flu 09/2013  . GERD (gastroesophageal reflux disease)    takes Nexium daily  . Glaucoma   . History of blood transfusion    "related to cancer"  . History of colon polyps   . HLD (hyperlipidemia)    takes Vytorin daily  . HTN (hypertension)    takes Amlodipine,Metoprolol, and Ramipril daily  . Insomnia    states he has always been like this.But doesn't take any meds  . Kidney stones    "I passed them all"  . Nocturia   . Peripheral edema    takes Furosemide daily  . Peripheral neuropathy ([place])   . Pneumonia 09/2013  . Rheumatic fever ~ 1965  . Rheumatic heart disease    a. s/p mechanical (St. Jude) MVR 2002;  b. Echo 5/11: Mild LVH, EF 45-50%, mild AS, mild AI, mean gradient 11 mmHg, MVR with normal gradients, moderate LAE, mild RAE;  c.  Echo 11/13:   mod LVH, EF 55-60%, mild  AS (mean 18 mmHg), mild AI, severe LAE, mild RAE, PASP 41  . 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 SOB (shortness of breath)    with exertion  . Type II diabetes mellitus ([place])    takes Januvia,Glipizide,and Metformin daily as well as Lantus    Past Surgical History:  Procedure Laterality Date  . APPLICATION OF WOUND VAC  2014  . CARDIAC CATHETERIZATION  2002  . COLONOSCOPY    . CORONARY ARTERY BYPASS GRAFT  2002   x 3  . ESOPHAGOGASTRODUODENOSCOPY    . HERNIA REPAIR    . INCISIONAL HERNIA REPAIR N/A 03/15/2014   Procedure: LAPAROSCOPIC INCISIONAL HERNIA;  Surgeon: Harl Bowie, MD;  Location: [place];  Service: General;  Laterality: N/A;  . INSERTION OF MESH N/A 03/15/2014   Procedure: INSERTION OF MESH;  Surgeon: Harl Bowie, MD;  Location: [place];  Service: General;  Laterality: N/A;  . LAPAROSCOPIC INCISIONAL / UMBILICAL / [place]  03/15/2014   IHR  . LAPAROTOMY N/A 02/09/2013   Procedure: EXPLORATORY LAPAROTOMY WITH incisional biopsy intra- ABDOMINAL MASS ILOCECTOMY ;  Surgeon: Harl Bowie,  MD;  [facility];  Service: General;  Laterality: N/A;  . MITRAL VALVE REPLACEMENT  11/2000   #33 St. Jude mechanical mitral valve prosthesis. Dr. Servando Snare  . PORT-A-CATH REMOVAL  03/15/2014  . PORT-A-CATH REMOVAL Left 03/15/2014   Procedure: REMOVAL PORT-A-CATH;  Surgeon: Harl Bowie, MD;  Location: [place];  Service: General;  Laterality: Left;  . PORTACATH PLACEMENT N/A 02/09/2013   Procedure: INSERTION PORT-A-CATH;  Surgeon: Harl Bowie, MD;  [facility];  Service: General;  Laterality: N/A;  . SEPTOPLASTY      There were no vitals filed for this visit.       Subjective Assessment - 06/04/16 1206    Subjective The patient reports the onset of low back pain about a month ago.  He states he bought a pool table for home use and was playing quite a bit.  He started to feel some lower back pain then suddenly  had excuriating low back pain such that he had to go to an ED.  He has also received an injection and this helped a great deal.  Upon presentation to the clinic today his pain was rated at a low 1/10.  Twisting and bending increases his pain-level.  Sitting and lying down decrease his pain.   Patient Stated Goals "Return to a semi-normal life."   Currently in Pain? Yes   Pain Score 1    Pain Location Back   Pain Orientation Right;Mid;Lower   Pain Descriptors / Indicators --  Mild.   Pain Type Acute pain   Pain Onset 1 to 4 weeks ago   Pain Frequency Intermittent   Aggravating Factors  Please see above.            [facility] PT Assessment - 06/04/16 0001      Assessment   Medical Diagnosis Acute low back pain.   Referring Provider Dione Housekeeper MD.   Onset Date/Surgical Date --  One month.     Precautions   Precautions --  No ultrasound.     Restrictions   Weight Bearing Restrictions No     Balance Screen   Has the patient fallen in the past 6 months No   Has the patient had a decrease in activity level because of a fear of falling?  No   Is the patient reluctant to leave their home because of a fear of falling?  No     Home Ecologist residence     Prior Function   Level of Independence Independent     Posture/Postural Control   Posture/Postural Control No significant limitations     ROM / Strength   AROM / PROM / Strength --  10 degrees of active lumbar extension.     Palpation   Palpation comment Tender with overpressure at L4 to S1 and right of his area.     Special Tests    Special Tests --  Absent LE DTR's;(-)SLR; (=) leg lengths.     Bed Mobility   Bed Mobility --  WNL.     Transfers   Transfers --  WNL.     Ambulation/Gait   Gait Comments Normal gait cycle.                   [place] [facility] Adult PT Treatment/Exercise - 06/04/16 0001      Modalities   Modalities Electrical Stimulation;Moist Heat     Moist  Heat Therapy   Number Minutes Moist Heat 15  Minutes   Moist Heat Location --  Low back.     Acupuncturist Location Right lower lumbar area.   Electrical Stimulation Action Constant Pre-mod.   Electrical Stimulation Parameters 80-150 Hz.   Electrical Stimulation Goals Pain                  PT Short Term Goals - 06/04/16 1218      PT SHORT TERM GOAL #1   Title STG's=LTG's.           PT Long Term Goals - 06/04/16 1218      PT LONG TERM GOAL #1   Title Independent with an HEP.   Time 4   Period Weeks   Status New     PT LONG TERM GOAL #2   Title Perform ADL's with pain not > 3/10.   Time 4   Period Weeks   Status New               Plan - 06/04/16 1222    PT Next Visit Plan Back stabilization exercises.  Modalities and STW/M PRN.  No ultrasound.      Patient will benefit from skilled therapeutic intervention in order to improve the following deficits and impairments:  Pain, Decreased activity tolerance, Decreased range of motion  Visit Diagnosis: Midline low back pain without sciatica - Plan: PT plan of care cert/re-cert      G-Codes - 93/57/01 12-06-17    Functional Assessment Tool Used FOTO...47% limitation.   Functional Limitation Mobility: Walking and moving around   Mobility: Walking and Moving Around Current Status [phone]) At least 40 percent but less than 60 percent impaired, limited or restricted   Mobility: Walking and Moving Around Goal Status [phone]) At least 1 percent but less than 20 percent impaired, limited or restricted       Problem List Patient Active Problem List   Diagnosis Date Noted  . Incisional hernia 03/15/2014  . 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 Abdominal mass 01/16/2013  . Anemia 08/04/2012  . CAD (coronary artery disease) 05/09/2011  . Obesity 05/09/2011  . CARDIOMYOPATHY 01/11/2010  . ATRIAL FIBRILLATION 01/11/2010  . CHEST PAIN  01/11/2010  . MITRAL VALVE REPLACEMENT, HX OF 01/11/2010  . HYPERLIPIDEMIA 11/30/2008  . HYPERTENSION 11/30/2008  . UNSPEC ALVEOLAR&PARIETOALVEOLAR PNEUMONOPATHY 11/30/2008  . PULMONARY NODULE 11/30/2008    Marybelle Giraldo, [place] MPT 06/04/2016, 12:31 PM  [facility] [place], [place], [address] Phone: [phone]   Fax:  [phone]  Name: John [name] MRN: [id number] Date of Birth: Dec 27, 1950

## 2016-06-08 ENCOUNTER — Ambulatory Visit: Payer: Medicare HMO | Admitting: Physical Therapy

## 2016-06-08 ENCOUNTER — Encounter: Payer: Self-pay | Admitting: Physical Therapy

## 2016-06-08 DIAGNOSIS — M545 Low back pain, unspecified: Secondary | ICD-10-CM

## 2016-06-08 NOTE — Therapy (Signed)
[facility] [place], [place], [address] Phone: [phone]   Fax:  [phone]  Physical Therapy Treatment  Patient Details  Name: John [name] MRN: [id number] Date of Birth: 1950-10-20 Referring Provider: Dione Housekeeper MD.  Encounter Date: 06/08/2016      PT End of Session - 06/08/16 0824    Visit Number 2   Number of Visits 8   Date for PT Re-Evaluation 07/02/16   PT Start Time 0821   PT Stop Time 0909   PT Time Calculation (min) 48 min   Activity Tolerance Patient tolerated treatment well   Behavior During Therapy [facility] for tasks assessed/performed      Past Medical History:  Diagnosis Date  . Anemia    "lost blood w/the cancer"  . Arthritis    back   . Atrial fibrillation ([place])    takes Coumadin daily  . Bruises easily    takes Coumadin daily  . CAD (coronary artery disease)    a. s/p CABG 11/2000 (L-LAD, S-RCA);  b. Lexiscan Myoview 11/12: EF 45%, ischemia and possibly some scar at the base of the inferolateral wall;   c. Lex MV 11/13:  EF 44%, Inf and IL scar/soft tissue atten, small mild amt of inf ischemia,  . Constipation    takes Colace daily  . Enlarged prostate   . Flu 09/2013  . GERD (gastroesophageal reflux disease)    takes Nexium daily  . Glaucoma   . History of blood transfusion    "related to cancer"  . History of colon polyps   . HLD (hyperlipidemia)    takes Vytorin daily  . HTN (hypertension)    takes Amlodipine,Metoprolol, and Ramipril daily  . Insomnia    states he has always been like this.But doesn't take any meds  . Kidney stones    "I passed them all"  . Nocturia   . Peripheral edema    takes Furosemide daily  . Peripheral neuropathy ([place])   . Pneumonia 09/2013  . Rheumatic fever ~ 1965  . Rheumatic heart disease    a. s/p mechanical (St. Jude) MVR 2002;  b. Echo 5/11: Mild LVH, EF 45-50%, mild AS, mild AI, mean gradient 11 mmHg, MVR with normal gradients, moderate LAE,  mild RAE;  c.  Echo 11/13:   mod LVH, EF 55-60%, mild AS (mean 18 mmHg), mild AI, severe LAE, mild RAE, PASP 41  . 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 SOB (shortness of breath)    with exertion  . Type II diabetes mellitus ([place])    takes Januvia,Glipizide,and Metformin daily as well as Lantus    Past Surgical History:  Procedure Laterality Date  . APPLICATION OF WOUND VAC  2014  . CARDIAC CATHETERIZATION  2002  . COLONOSCOPY    . CORONARY ARTERY BYPASS GRAFT  2002   x 3  . ESOPHAGOGASTRODUODENOSCOPY    . HERNIA REPAIR    . INCISIONAL HERNIA REPAIR N/A 03/15/2014   Procedure: LAPAROSCOPIC INCISIONAL HERNIA;  Surgeon: Harl Bowie, MD;  Location: [place];  Service: General;  Laterality: N/A;  . INSERTION OF MESH N/A 03/15/2014   Procedure: INSERTION OF MESH;  Surgeon: Harl Bowie, MD;  Location: [place];  Service: General;  Laterality: N/A;  . LAPAROSCOPIC INCISIONAL / UMBILICAL / [place]  03/15/2014   IHR  . LAPAROTOMY N/A 02/09/2013   Procedure: EXPLORATORY LAPAROTOMY WITH incisional biopsy intra- ABDOMINAL  MASS ILOCECTOMY ;  Surgeon: Harl Bowie, MD;  [facility];  Service: General;  Laterality: N/A;  . MITRAL VALVE REPLACEMENT  11/2000   #33 St. Jude mechanical mitral valve prosthesis. Dr. Servando Snare  . PORT-A-CATH REMOVAL  03/15/2014  . PORT-A-CATH REMOVAL Left 03/15/2014   Procedure: REMOVAL PORT-A-CATH;  Surgeon: Harl Bowie, MD;  Location: [place];  Service: General;  Laterality: Left;  . PORTACATH PLACEMENT N/A 02/09/2013   Procedure: INSERTION PORT-A-CATH;  Surgeon: Harl Bowie, MD;  [facility];  Service: General;  Laterality: N/A;  . SEPTOPLASTY      There were no vitals filed for this visit.      Subjective Assessment - 06/08/16 0821    Subjective Reports that he is tight where he cleaned up his car yesterday and also started his big toe arthritis up. Reports that pain hasn't really gone  down into LEs its just off set to R low back.   Patient Stated Goals "Return to a semi-normal life."   Currently in Pain? No/denies            [facility] PT Assessment - 06/08/16 0001      Assessment   Medical Diagnosis Acute low back pain.     Restrictions   Weight Bearing Restrictions No                     [facility] Adult PT Treatment/Exercise - 06/08/16 0001      Exercises   Exercises Lumbar     Lumbar Exercises: Stretches   Active Hamstring Stretch 3 reps;30 seconds  RLE   Single Knee to Chest Stretch 2 reps;60 seconds  RLE   Piriformis Stretch 2 reps;30 seconds  RLE     Lumbar Exercises: Supine   Ab Set 20 reps;5 seconds   Clam 20 reps   Bridge 20 reps     Modalities   Modalities Electrical Stimulation;Moist Heat     Moist Heat Therapy   Number Minutes Moist Heat 15 Minutes   Moist Heat Location Lumbar Spine     Electrical Stimulation   Electrical Stimulation Location B low back   Electrical Stimulation Action Pre-Mod   Electrical Stimulation Parameters 80-150 hz x15 min   Electrical Stimulation Goals Pain                  PT Short Term Goals - 06/04/16 1218      PT SHORT TERM GOAL #1   Title STG's=LTG's.           PT Long Term Goals - 06/04/16 1218      PT LONG TERM GOAL #1   Title Independent with an HEP.   Time 4   Period Weeks   Status New     PT LONG TERM GOAL #2   Title Perform ADL's with pain not > 3/10.   Time 4   Period Weeks   Status New               Plan - 06/08/16 0856    Clinical Impression Statement Patient arrived to treatment with no low back pain although did report low back tightness from cleaning his car yesterday. Patient able to tolerate low back stretching and core strengthening well with only report of "feeling it" with supine bridge exercise. Normal modalities response noted following removal of the modalities. Patient's goals remain on-going at this time secondary to this being patient's  second visit to clinic. Patient did not report  any adverse symptoms following today's treatment.    Rehab Potential Excellent   PT Frequency 2x / week   PT Duration 4 weeks   PT Treatment/Interventions ADLs/Self Care Home Management;Cryotherapy;Electrical Stimulation;Moist Heat;Therapeutic activities;Therapeutic exercise;Patient/family education;Manual techniques   PT Next Visit Plan Continue with core strengthening and modalities PRN per MPT POC.   Consulted and Agree with Plan of Care Patient      Patient will benefit from skilled therapeutic intervention in order to improve the following deficits and impairments:  Pain, Decreased activity tolerance, Decreased range of motion  Visit Diagnosis: Midline low back pain without sciatica     Problem List Patient Active Problem List   Diagnosis Date Noted  . Incisional hernia 03/15/2014  . 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 Abdominal mass 01/16/2013  . Anemia 08/04/2012  . CAD (coronary artery disease) 05/09/2011  . Obesity 05/09/2011  . CARDIOMYOPATHY 01/11/2010  . ATRIAL FIBRILLATION 01/11/2010  . CHEST PAIN 01/11/2010  . MITRAL VALVE REPLACEMENT, HX OF 01/11/2010  . HYPERLIPIDEMIA 11/30/2008  . HYPERTENSION 11/30/2008  . UNSPEC ALVEOLAR&PARIETOALVEOLAR PNEUMONOPATHY 11/30/2008  . PULMONARY NODULE 11/30/2008    Wynelle Fanny, PTA 06/08/2016, 9:15 AM  [facility] [address] [place], [place], [address] Phone: [phone]   Fax:  [phone]  Name: John [name] MRN: [id number] Date of Birth: 29-Mar-1951

## 2016-06-13 ENCOUNTER — Ambulatory Visit: Payer: Medicare HMO | Attending: Family Medicine | Admitting: Physical Therapy

## 2016-06-13 DIAGNOSIS — M545 Low back pain, unspecified: Secondary | ICD-10-CM

## 2016-06-13 NOTE — Therapy (Signed)
[facility] [place], [place], [address] Phone: [phone]   Fax:  [phone]  Physical Therapy Treatment  Patient Details  Name: John [name] MRN: [id number] Date of Birth: 1951-05-18 Referring Provider: Dione Housekeeper MD.  Encounter Date: 06/13/2016      PT End of Session - 06/13/16 1010    Visit Number 3   Number of Visits 8   Date for PT Re-Evaluation 07/02/16   PT Start Time 0940      Past Medical History:  Diagnosis Date  . Anemia    "lost blood w/the cancer"  . Arthritis    back   . Atrial fibrillation ([place])    takes Coumadin daily  . Bruises easily    takes Coumadin daily  . CAD (coronary artery disease)    a. s/p CABG 11/2000 (L-LAD, S-RCA);  b. Lexiscan Myoview 11/12: EF 45%, ischemia and possibly some scar at the base of the inferolateral wall;   c. Lex MV 11/13:  EF 44%, Inf and IL scar/soft tissue atten, small mild amt of inf ischemia,  . Constipation    takes Colace daily  . Enlarged prostate   . Flu 09/2013  . GERD (gastroesophageal reflux disease)    takes Nexium daily  . Glaucoma   . History of blood transfusion    "related to cancer"  . History of colon polyps   . HLD (hyperlipidemia)    takes Vytorin daily  . HTN (hypertension)    takes Amlodipine,Metoprolol, and Ramipril daily  . Insomnia    states he has always been like this.But doesn't take any meds  . Kidney stones    "I passed them all"  . Nocturia   . Peripheral edema    takes Furosemide daily  . Peripheral neuropathy ([place])   . Pneumonia 09/2013  . Rheumatic fever ~ 1965  . Rheumatic heart disease    a. s/p mechanical (St. Jude) MVR 2002;  b. Echo 5/11: Mild LVH, EF 45-50%, mild AS, mild AI, mean gradient 11 mmHg, MVR with normal gradients, moderate LAE, mild RAE;  c.  Echo 11/13:   mod LVH, EF 55-60%, mild AS (mean 18 mmHg), mild AI, severe LAE, mild RAE, PASP 41  . 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 Small  bowel cancer ([place]) 2014  . SOB (shortness of breath)    with exertion  . Type II diabetes mellitus ([place])    takes Januvia,Glipizide,and Metformin daily as well as Lantus    Past Surgical History:  Procedure Laterality Date  . APPLICATION OF WOUND VAC  2014  . CARDIAC CATHETERIZATION  2002  . COLONOSCOPY    . CORONARY ARTERY BYPASS GRAFT  2002   x 3  . ESOPHAGOGASTRODUODENOSCOPY    . HERNIA REPAIR    . INCISIONAL HERNIA REPAIR N/A 03/15/2014   Procedure: LAPAROSCOPIC INCISIONAL HERNIA;  Surgeon: Harl Bowie, MD;  Location: [place];  Service: General;  Laterality: N/A;  . INSERTION OF MESH N/A 03/15/2014   Procedure: INSERTION OF MESH;  Surgeon: Harl Bowie, MD;  Location: [place];  Service: General;  Laterality: N/A;  . LAPAROSCOPIC INCISIONAL / UMBILICAL / [place]  03/15/2014   IHR  . LAPAROTOMY N/A 02/09/2013   Procedure: EXPLORATORY LAPAROTOMY WITH incisional biopsy intra- ABDOMINAL MASS ILOCECTOMY ;  Surgeon: Harl Bowie, MD;  [facility];  Service: General;  Laterality: N/A;  . MITRAL VALVE REPLACEMENT  11/2000   #33 St. Jude  mechanical mitral valve prosthesis. Dr. Servando Snare  . PORT-A-CATH REMOVAL  03/15/2014  . PORT-A-CATH REMOVAL Left 03/15/2014   Procedure: REMOVAL PORT-A-CATH;  Surgeon: Harl Bowie, MD;  Location: [place];  Service: General;  Laterality: Left;  . PORTACATH PLACEMENT N/A 02/09/2013   Procedure: INSERTION PORT-A-CATH;  Surgeon: Harl Bowie, MD;  [facility];  Service: General;  Laterality: N/A;  . SEPTOPLASTY      There were no vitals filed for this visit.      Subjective Assessment - 06/13/16 1011    Subjective These treatments are really helping.   Patient Stated Goals "Return to a semi-normal life."   Pain Score 1    Pain Location Back   Pain Orientation Right;Lower;Mid   Pain Type Acute pain   Pain Onset 1 to 4 weeks ago                         [facility] Adult PT Treatment/Exercise - 06/13/16  0001      Modalities   Modalities Electrical Stimulation;Moist Heat;Ultrasound     Moist Heat Therapy   Number Minutes Moist Heat 15 Minutes   Moist Heat Location Lumbar Spine     Electrical Stimulation   Electrical Stimulation Location Bil low back.   Electrical Stimulation Action Constant Pre-mod e'stim   Electrical Stimulation Parameters 80-150 Hz   Electrical Stimulation Goals Pain     Ultrasound   Ultrasound Location RT LB.   Ultrasound Parameters 1.50 W/CM2 x 12 minutes.   Ultrasound Goals Pain     Manual Therapy   Manual Therapy Soft tissue mobilization   Manual therapy comments Left sdly position with folded pillow between knees for comfort:  STW/M to patient's affected right low back musculature x 11 minutes.                  PT Short Term Goals - 06/04/16 1218      PT SHORT TERM GOAL #1   Title STG's=LTG's.           PT Long Term Goals - 06/04/16 1218      PT LONG TERM GOAL #1   Title Independent with an HEP.   Time 4   Period Weeks   Status New     PT LONG TERM GOAL #2   Title Perform ADL's with pain not > 3/10.   Time 4   Period Weeks   Status New             Patient will benefit from skilled therapeutic intervention in order to improve the following deficits and impairments:  Pain, Decreased activity tolerance, Decreased range of motion  Visit Diagnosis: Acute midline low back pain without sciatica     Problem List Patient Active Problem List   Diagnosis Date Noted  . Incisional hernia 03/15/2014  . 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 Abdominal mass 01/16/2013  . Anemia 08/04/2012  . CAD (coronary artery disease) 05/09/2011  . Obesity 05/09/2011  . CARDIOMYOPATHY 01/11/2010  . ATRIAL FIBRILLATION 01/11/2010  . CHEST PAIN 01/11/2010  . MITRAL VALVE REPLACEMENT, HX OF 01/11/2010  . HYPERLIPIDEMIA 11/30/2008  . HYPERTENSION 11/30/2008  . UNSPEC ALVEOLAR&PARIETOALVEOLAR  PNEUMONOPATHY 11/30/2008  . PULMONARY NODULE 11/30/2008    Dream Harman, [place] MPT 06/13/2016, 10:16 AM  [facility] [address] [place], [place], [address] Phone: [phone]   Fax:  [phone]  Name: John [name] MRN: [id number]  Date of Birth: 01/12/1951

## 2016-06-22 ENCOUNTER — Encounter: Payer: Self-pay | Admitting: Physical Therapy

## 2016-06-22 ENCOUNTER — Ambulatory Visit: Payer: Medicare HMO | Admitting: Physical Therapy

## 2016-06-22 DIAGNOSIS — M545 Low back pain, unspecified: Secondary | ICD-10-CM

## 2016-06-22 NOTE — Therapy (Addendum)
[facility] [place], [place], [address] Phone: [phone]   Fax:  [phone]  Physical Therapy Treatment  Patient Details  Name: John [name] MRN: [id number] Date of Birth: 1951-04-15 Referring Provider: Dione Housekeeper MD.  Encounter Date: 06/22/2016      PT End of Session - 06/22/16 0847    Visit Number 4   Number of Visits 8   Date for PT Re-Evaluation 07/02/16   PT Start Time 0909   PT Stop Time 0958   PT Time Calculation (min) 49 min   Activity Tolerance Patient tolerated treatment well   Behavior During Therapy [facility] for tasks assessed/performed      Past Medical History:  Diagnosis Date  . Anemia    "lost blood w/the cancer"  . Arthritis    back   . Atrial fibrillation ([place])    takes Coumadin daily  . Bruises easily    takes Coumadin daily  . CAD (coronary artery disease)    a. s/p CABG 11/2000 (L-LAD, S-RCA);  b. Lexiscan Myoview 11/12: EF 45%, ischemia and possibly some scar at the base of the inferolateral wall;   c. Lex MV 11/13:  EF 44%, Inf and IL scar/soft tissue atten, small mild amt of inf ischemia,  . Constipation    takes Colace daily  . Enlarged prostate   . Flu 09/2013  . GERD (gastroesophageal reflux disease)    takes Nexium daily  . Glaucoma   . History of blood transfusion    "related to cancer"  . History of colon polyps   . HLD (hyperlipidemia)    takes Vytorin daily  . HTN (hypertension)    takes Amlodipine,Metoprolol, and Ramipril daily  . Insomnia    states he has always been like this.But doesn't take any meds  . Kidney stones    "I passed them all"  . Nocturia   . Peripheral edema    takes Furosemide daily  . Peripheral neuropathy ([place])   . Pneumonia 09/2013  . Rheumatic fever ~ 1965  . Rheumatic heart disease    a. s/p mechanical (St. Jude) MVR 2002;  b. Echo 5/11: Mild LVH, EF 45-50%, mild AS, mild AI, mean gradient 11 mmHg, MVR with normal gradients, moderate LAE,  mild RAE;  c.  Echo 11/13:   mod LVH, EF 55-60%, mild AS (mean 18 mmHg), mild AI, severe LAE, mild RAE, PASP 41  . 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 SOB (shortness of breath)    with exertion  . Type II diabetes mellitus ([place])    takes Januvia,Glipizide,and Metformin daily as well as Lantus    Past Surgical History:  Procedure Laterality Date  . APPLICATION OF WOUND VAC  2014  . CARDIAC CATHETERIZATION  2002  . COLONOSCOPY    . CORONARY ARTERY BYPASS GRAFT  2002   x 3  . ESOPHAGOGASTRODUODENOSCOPY    . HERNIA REPAIR    . INCISIONAL HERNIA REPAIR N/A 03/15/2014   Procedure: LAPAROSCOPIC INCISIONAL HERNIA;  Surgeon: Harl Bowie, MD;  Location: [place];  Service: General;  Laterality: N/A;  . INSERTION OF MESH N/A 03/15/2014   Procedure: INSERTION OF MESH;  Surgeon: Harl Bowie, MD;  Location: [place];  Service: General;  Laterality: N/A;  . LAPAROSCOPIC INCISIONAL / UMBILICAL / [place]  03/15/2014   IHR  . LAPAROTOMY N/A 02/09/2013   Procedure: EXPLORATORY LAPAROTOMY WITH incisional biopsy intra- ABDOMINAL  MASS ILOCECTOMY ;  Surgeon: Harl Bowie, MD;  Location: [facility];  Service: General;  Laterality: N/A;  . MITRAL VALVE REPLACEMENT  11/2000   #33 St. Jude mechanical mitral valve prosthesis. Dr. Servando Snare  . PORT-A-CATH REMOVAL  03/15/2014  . PORT-A-CATH REMOVAL Left 03/15/2014   Procedure: REMOVAL PORT-A-CATH;  Surgeon: Harl Bowie, MD;  Location: [place];  Service: General;  Laterality: Left;  . PORTACATH PLACEMENT N/A 02/09/2013   Procedure: INSERTION PORT-A-CATH;  Surgeon: Harl Bowie, MD;  [facility];  Service: General;  Laterality: N/A;  . SEPTOPLASTY      There were no vitals filed for this visit.      Subjective Assessment - 06/22/16 0847    Subjective Reports that L big toe is better and can now sleep on R side. Reports that he has some low back pain but not enough to take any medication.    Patient Stated Goals "Return to a semi-normal life."   Currently in Pain? --  "nothing worth mentioning"            [facility] PT Assessment - 06/22/16 0001      Assessment   Medical Diagnosis Acute low back pain.     Restrictions   Weight Bearing Restrictions No                     [facility] Adult PT Treatment/Exercise - 06/22/16 0001      Lumbar Exercises: Stretches   Active Hamstring Stretch 3 reps;30 seconds  RLE   Single Knee to Chest Stretch 3 reps;30 seconds  RLE     Lumbar Exercises: Supine   Clam 20 reps   Heel Slides 20 reps   Heel Slides Limitations BLE   Bent Knee Raise 20 reps   Bridge 20 reps     Modalities   Modalities Electrical Stimulation;Moist Heat     Moist Heat Therapy   Number Minutes Moist Heat 15 Minutes   Moist Heat Location Lumbar Spine     Electrical Stimulation   Electrical Stimulation Location Bil low back.   Electrical Stimulation Action Pre-Mod   Electrical Stimulation Parameters 80-150 hz x15 min   Electrical Stimulation Goals Pain                PT Education - 06/22/16 0945    Education provided Yes   Education Details HEP- SKTC, marching, bridging, SLR   Person(s) Educated Patient   Methods Explanation;Demonstration;Verbal cues;Handout   Comprehension Verbalized understanding;Returned demonstration;Verbal cues required          PT Short Term Goals - 06/04/16 1218      PT SHORT TERM GOAL #1   Title STG's=LTG's.           PT Long Term Goals - 06/22/16 0941      PT LONG TERM GOAL #1   Title Independent with an HEP.   Time 4   Period Weeks   Status New     PT LONG TERM GOAL #2   Title Perform ADL's with pain not > 3/10.   Time 4   Period Weeks   Status Achieved               Plan - 06/22/16 0902    Clinical Impression Statement Patient arrived to treatment with overall improvement in condition with low back pain and L big toe pain. Patient had no complaints of increased pain with  stretching or strengthening exercises. Patient required minimal multimodal cueing  for proper exercise technique. Normal modalities response noted following removal of the modalities. Patient was given HEP for low back stretching and strengthening and was given instruction regarding technique and parameters. Patient verbalized understanding regarding technique and parameters. Patient able to achieve LTG regarding pain with ADLs.   Rehab Potential Excellent   PT Frequency 2x / week   PT Duration 4 weeks   PT Treatment/Interventions ADLs/Self Care Home Management;Cryotherapy;Electrical Stimulation;Moist Heat;Therapeutic activities;Therapeutic exercise;Patient/family education;Manual techniques   PT Next Visit Plan Continue with core strengthening and modalities PRN per MPT POC.   Consulted and Agree with Plan of Care Patient      Patient will benefit from skilled therapeutic intervention in order to improve the following deficits and impairments:  Pain, Decreased activity tolerance, Decreased range of motion  Visit Diagnosis: Acute midline low back pain without sciatica     Problem List Patient Active Problem List   Diagnosis Date Noted  . Incisional hernia 03/15/2014  . 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 Abdominal mass 01/16/2013  . Anemia 08/04/2012  . CAD (coronary artery disease) 05/09/2011  . Obesity 05/09/2011  . CARDIOMYOPATHY 01/11/2010  . ATRIAL FIBRILLATION 01/11/2010  . CHEST PAIN 01/11/2010  . MITRAL VALVE REPLACEMENT, HX OF 01/11/2010  . HYPERLIPIDEMIA 11/30/2008  . HYPERTENSION 11/30/2008  . UNSPEC ALVEOLAR&PARIETOALVEOLAR PNEUMONOPATHY 11/30/2008  . PULMONARY NODULE 11/30/2008    Wynelle Fanny, PTA 06/22/2016, 10:07 AM  [facility] [place], [place], [address] Phone: [phone]   Fax:  [phone]  Name: John [name] MRN: [id number] Date of Birth:  [birth year]   PHYSICAL THERAPY DISCHARGE SUMMARY  Visits from Start of Care: 4.  Current functional level related to goals / functional outcomes: See above.   Remaining deficits: Goals met.   Education / Equipment: HEP. Plan: Patient agrees to discharge.  Patient goals were met. Patient is being discharged due to meeting the stated rehab goals.  ?????         Mali Applegate MPT

## 2016-06-22 NOTE — Patient Instructions (Addendum)
Knee-to-Chest Stretch: Unilateral    With hand behind right knee, pull knee in to chest until a comfortable stretch is felt in lower back and buttocks. Keep back relaxed. Hold _30___ seconds. Repeat __3__ times per set. Do ____ sets per session. Do __2-3__ sessions per day.  http://orth.exer.us/126   Copyright  VHI. All rights reserved.  Bent Leg Lift (Hook-Lying)    Tighten stomach and slowly raise right/left leg _5___ inches from floor. Keep trunk rigid.  Repeat __10__ times per set. Do _2___ sets per session. Do __2-3__ sessions per day.  http://orth.exer.us/1090   Copyright  VHI. All rights reserved.  Bridging    Slowly raise buttocks from floor, keeping stomach tight. Repeat __10__ times per set. Do _2___ sets per session. Do __2-3__ sessions per day.  http://orth.exer.us/1096   Copyright  VHI. All rights reserved.  Straight Leg Raise    Tighten stomach and slowly raise locked right/left leg _5___ inches from floor. Repeat _10___ times per set. Do __2__ sets per session. Do _2-3___ sessions per day.  http://orth.exer.us/1102   Copyright  VHI. All rights reserved.

## 2016-07-05 ENCOUNTER — Other Ambulatory Visit (HOSPITAL_BASED_OUTPATIENT_CLINIC_OR_DEPARTMENT_OTHER): Payer: Medicare HMO

## 2016-07-05 ENCOUNTER — Telehealth: Payer: Self-pay | Admitting: Oncology

## 2016-07-05 ENCOUNTER — Ambulatory Visit (HOSPITAL_BASED_OUTPATIENT_CLINIC_OR_DEPARTMENT_OTHER): Payer: Medicare HMO | Admitting: Oncology

## 2016-07-05 VITALS — BP 155/57 | HR 54 | Temp 97.7°F | Resp 18 | Ht 72.0 in | Wt 222.6 lb

## 2016-07-05 DIAGNOSIS — Z8572 Personal history of non-Hodgkin lymphomas: Secondary | ICD-10-CM

## 2016-07-05 DIAGNOSIS — C8299 Follicular lymphoma, unspecified, extranodal and solid organ sites: Secondary | ICD-10-CM

## 2016-07-05 DIAGNOSIS — E119 Type 2 diabetes mellitus without complications: Secondary | ICD-10-CM

## 2016-07-05 DIAGNOSIS — D649 Anemia, unspecified: Secondary | ICD-10-CM

## 2016-07-05 DIAGNOSIS — I10 Essential (primary) hypertension: Secondary | ICD-10-CM | POA: Diagnosis not present

## 2016-07-05 DIAGNOSIS — N189 Chronic kidney disease, unspecified: Secondary | ICD-10-CM

## 2016-07-05 LAB — CBC WITH DIFFERENTIAL/PLATELET
BASO%: 0.6 % (ref 0.0–2.0)
Basophils Absolute: 0 10*3/uL (ref 0.0–0.1)
EOS%: 3.3 % (ref 0.0–7.0)
Eosinophils Absolute: 0.2 10*3/uL (ref 0.0–0.5)
HCT: 40.4 % (ref 38.4–49.9)
HGB: 13.7 g/dL (ref 13.0–17.1)
LYMPH%: 16.4 % (ref 14.0–49.0)
MCH: 30.9 pg (ref 27.2–33.4)
MCHC: 33.9 g/dL (ref 32.0–36.0)
MCV: 91.2 fL (ref 79.3–98.0)
MONO#: 0.8 10*3/uL (ref 0.1–0.9)
MONO%: 12.1 % (ref 0.0–14.0)
NEUT#: 4.3 10*3/uL (ref 1.5–6.5)
NEUT%: 67.6 % (ref 39.0–75.0)
Platelets: 169 10*3/uL (ref 140–400)
RBC: 4.43 10*6/uL (ref 4.20–5.82)
RDW: 13.9 % (ref 11.0–14.6)
WBC: 6.3 10*3/uL (ref 4.0–10.3)
lymph#: 1 10*3/uL (ref 0.9–3.3)

## 2016-07-05 LAB — COMPREHENSIVE METABOLIC PANEL
ALT: 17 U/L (ref 0–55)
AST: 19 U/L (ref 5–34)
Albumin: 3.6 g/dL (ref 3.5–5.0)
Alkaline Phosphatase: 129 U/L (ref 40–150)
Anion Gap: 10 mEq/L (ref 3–11)
BUN: 53 mg/dL — ABNORMAL HIGH (ref 7.0–26.0)
CO2: 25 mEq/L (ref 22–29)
Calcium: 9.2 mg/dL (ref 8.4–10.4)
Chloride: 108 mEq/L (ref 98–109)
Creatinine: 1.4 mg/dL — ABNORMAL HIGH (ref 0.7–1.3)
EGFR: 55 mL/min/{1.73_m2} — ABNORMAL LOW (ref >90)
Glucose: 139 mg/dl (ref 70–140)
Potassium: 4.8 mEq/L (ref 3.5–5.1)
Sodium: 143 mEq/L (ref 136–145)
Total Bilirubin: 0.51 mg/dL (ref 0.20–1.20)
Total Protein: 7.2 g/dL (ref 6.4–8.3)

## 2016-07-05 LAB — LACTATE DEHYDROGENASE: LDH: 301 U/L — ABNORMAL HIGH (ref 125–245)

## 2016-07-05 NOTE — Progress Notes (Signed)
Hematology and Oncology Follow Up Visit  John [name] [id number] July 17, 1951 65 y.o. 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   Prior Therapy: He is status post exploratory laparotomy and excision of an intra-abdominal mass and ileocolectomy and a Port-A-Cath placement on 02/09/2013 Chemotherapy with CHOP-R cycle one given on 04/16/2013. He is status post 4 cycles of chemotherapy completed in 06/2013.  Current therapy:  Observation and surveillance.  Interim History:  [name] presents today for a followup visit with his wife. Since the last visit, he continues to do reasonably well without health issues. 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 His pain is improved at this time. He remains on warfarin for prosthetic valve and his INR was supratherapeutic and recently been corrected. He denied any bleeding at this time.   He is not reporting any abdominal pain or discomfort. He  reports no early satiety or change in his bowel habits. He denies fevers, chills, sweats or other constitutional symptoms. He performs activities of daily living without any decline. He is able to work outside for extended period of time.    He does not report any headaches, blurry vision, syncope or seizures. He denies any problems with nausea or vomiting. He did not report any lymphadenopathy or easy bruisability. Has not reported any petechiae or bleeding. He does not report any chest pain, palpitation orthopnea. Does not report any cough or hemoptysis. He does not report any frequency urgency or hesitancy. Rest of his review of systems unremarkable.  Medications: I have reviewed the patient's current medications.   Current Outpatient Prescriptions  Medication Sig Dispense Refill  . amLODipine (NORVASC) 5 MG tablet Take 5 mg by mouth daily.    Marland Kitchen aspirin 81 MG chewable tablet Chew 81 mg by mouth daily.    Marland Kitchen BAYER BREEZE 2 TEST DISK   5  . Clobetasol Prop Emollient Base (CLOBETASOL PROPIONATE E) 0.05 % emollient cream Apply 1 application topically 2 (two) times daily as needed. To affected areas    . digoxin (LANOXIN) 0.25 MG tablet Take 0.25 mg by mouth every morning.     . Enoxaparin Sodium (LOVENOX IJ) Inject 1 each as directed every 12 (twelve) hours.    Marland Kitchen esomeprazole (NEXIUM) 40 MG capsule Take 40 mg by mouth at bedtime.     Marland Kitchen ezetimibe-simvastatin (VYTORIN) 10-40 MG per tablet Take 1 tablet by mouth at bedtime.    . furosemide (LASIX) 40 MG tablet Take 40 mg by mouth daily.     Marland Kitchen glipiZIDE (GLUCOTROL XL) 10 MG 24 hr tablet Take 10 mg by mouth daily.     Marland Kitchen HYDROcodone-acetaminophen (NORCO) 5-325 MG per tablet Take 1 tablet by mouth every 6 (six) hours as needed for pain. 30 tablet 0  . insulin glargine (LANTUS) 100 UNIT/ML injection Inject 75 Units into the skin 2 (two) times daily.    . metFORMIN (GLUCOPHAGE) 1000 MG tablet Take 1,000 mg by mouth 2 (two) times daily.     . metoprolol (TOPROL-XL) 200 MG 24 hr tablet Take 200 mg by mouth daily after breakfast.     . Misc Natural Products (OSTEO BI-FLEX ADV TRIPLE ST) TABS Take 500 each by mouth daily as needed (joint pain).     Marland Kitchen  nitroGLYCERIN (NITROSTAT) 0.4 MG SL tablet Place 0.4 mg under the tongue every 5 (five) minutes as needed for chest pain.    . Omega-3 Fatty Acids (FISH OIL) 1000 MG CAPS Take 1 capsule by mouth 2 (two) times daily.     . potassium chloride (K-DUR) 10 MEQ tablet Take 10 mEq by mouth.    . potassium chloride SA (K-DUR,KLOR-CON) 20 MEQ tablet Take 10 mEq by mouth 3 (three) times a week. Takes Mondays, Wednesdays, and Saturdays.    . sitaGLIPtin (JANUVIA) 100 MG tablet Take 100 mg by mouth daily.     Marland Kitchen warfarin (COUMADIN) 5 MG  tablet Take 5 mg by mouth daily.      No current facility-administered medications for this visit.      Allergies: No Known Allergies  Past Medical History, Surgical history, Social history, and Family History were reviewed and updated.    Physical Exam: Blood pressure (!) 155/57, pulse (!) 54, temperature 97.7 F (36.5 C), temperature source Oral, resp. rate 18, height 6' (1.829 m), weight 222 lb 9.6 oz (101 kg), SpO2 100 %. ECOG: 1 General appearance:  well-appearing gentleman without distress.  Head: Normocephalic, without obvious abnormality  Oral mucosa without ulcers or lesions. no thrush noted.  Neck: no adenopathy Lymph nodes: Cervical, supraclavicular, and axillary nodes normal. Heart: Bradycardic and have regular. metallic clips noted.  Chest wall examination revealed  well-healed scar.  Lung:chest clear, no wheezing, rales, normal symmetric air entry Abdomen: soft, non-tender, without masses or organomegaly no  shifting dullness or ascites.  EXT:no erythema, induration, or nodules   Lab Results: Lab Results  Component Value Date   WBC 6.3 07/05/2016   HGB 13.7 07/05/2016   HCT 40.4 07/05/2016   MCV 91.2 07/05/2016   PLT 169 07/05/2016     Chemistry      Component Value Date/Time   NA 140 12/22/2015 0747   K 4.6 12/22/2015 0747   CL 97 03/17/2014 0010   CL 106 01/21/2013 1050   CO2 25 12/22/2015 0747   BUN 45.4 (H) 12/22/2015 0747   CREATININE 1.4 (H) 12/22/2015 0747      Component Value Date/Time   CALCIUM 9.6 12/22/2015 0747   ALKPHOS 120 12/22/2015 0747   AST 27 12/22/2015 0747   ALT 28 12/22/2015 0747   BILITOT 0.49 12/22/2015 0747       Impression and Plan:  65 year old gentleman with the following issues:  1. 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 He is status post excisional biopsy and ileocolectomy followed by systemic chemotherapy. Imaging studies following that showed a  complete response and have been in remission since October 2014.  CT scan on 06/21/2015 continues to show no evidence of disease relapse.   He continues to be remission at this time without any findings on his laboratory data are physical examination to suggest otherwise. I have recommended continued observation and surveillance and repeat imaging studies as needed.    2. Anemia:  hemoglobin is within normal range.   3. Chronic renal insufficiency: Likely related to long-standing hypertension and diabetes with creatinine close to baseline.  4. Colon polyps: Status post colonoscopy which will be repeated in the future.   5. Followup: Will be in 6 months.      PJASNK,NLZJQ 10/26/20178:30 AM

## 2016-07-05 NOTE — Telephone Encounter (Signed)
AVS report and appointment schedule given to patient, per 07/05/16 los. °

## 2016-08-25 ENCOUNTER — Other Ambulatory Visit: Payer: Self-pay | Admitting: Nurse Practitioner

## 2016-11-28 ENCOUNTER — Ambulatory Visit (INDEPENDENT_AMBULATORY_CARE_PROVIDER_SITE_OTHER): Payer: Medicare HMO | Admitting: Cardiology

## 2016-11-28 ENCOUNTER — Encounter: Payer: Self-pay | Admitting: Cardiology

## 2016-11-28 VITALS — BP 132/50 | HR 52 | Ht 72.0 in | Wt 221.0 lb

## 2016-11-28 DIAGNOSIS — I480 Paroxysmal atrial fibrillation: Secondary | ICD-10-CM

## 2016-11-28 DIAGNOSIS — Z952 Presence of prosthetic heart valve: Secondary | ICD-10-CM

## 2016-11-28 NOTE — Progress Notes (Signed)
HPI The patient presents for follow of MVR and atrial fib.  Since I last saw him he has done well.  He reports that he is doing well.  The patient denies any new symptoms such as chest discomfort, neck or arm discomfort. There has been no new shortness of breath, PND or orthopnea. There have been no reported palpitations, presyncope or syncope.     Allergies  Allergen Reactions  . Doxycycline Hives    Current Outpatient Prescriptions  Medication Sig Dispense Refill  . amLODipine (NORVASC) 5 MG tablet Take 5 mg by mouth daily.    Marland Kitchen aspirin 81 MG chewable tablet Chew 81 mg by mouth daily.    Marland Kitchen esomeprazole (NEXIUM) 40 MG capsule Take 40 mg by mouth at bedtime.     Marland Kitchen ezetimibe-simvastatin (VYTORIN) 10-40 MG per tablet Take 1 tablet by mouth at bedtime.    . furosemide (LASIX) 40 MG tablet Take 40 mg by mouth 2 (two) times daily.     Marland Kitchen glipiZIDE (GLUCOTROL XL) 10 MG 24 hr tablet Take 10 mg by mouth daily.     . insulin glargine (LANTUS) 100 UNIT/ML injection Inject 75 Units into the skin 2 (two) times daily.    Marland Kitchen losartan (COZAAR) 100 MG tablet Take 100 mg by mouth daily.     . metFORMIN (GLUCOPHAGE) 1000 MG tablet Take 1,000 mg by mouth 2 (two) times daily.     . metoprolol (TOPROL-XL) 200 MG 24 hr tablet Take 200 mg by mouth daily after breakfast.     . Misc Natural Products (OSTEO BI-FLEX ADV TRIPLE ST) TABS Take 500 each by mouth daily as needed (joint pain).     . potassium chloride SA (K-DUR,KLOR-CON) 20 MEQ tablet Take 10 mEq by mouth 3 (three) times a week. Takes Mondays, Wednesdays, and Saturdays.    . pravastatin (PRAVACHOL) 40 MG tablet Take 40 mg by mouth daily.     Marland Kitchen warfarin (COUMADIN) 5 MG tablet Take 5 mg by mouth daily.     Marland Kitchen BAYER BREEZE 2 TEST DISK   5  . Clobetasol Prop Emollient Base (CLOBETASOL PROPIONATE E) 0.05 % emollient cream Apply 1 application topically 2 (two) times daily as needed. To affected areas    . nitroGLYCERIN (NITROSTAT) 0.4 MG SL tablet Place  0.4 mg under the tongue every 5 (five) minutes as needed for chest pain.    . sitaGLIPtin (JANUVIA) 100 MG tablet Take 100 mg by mouth daily.      No current facility-administered medications for this visit.     Past Medical History:  Diagnosis Date  . Anemia    "lost blood w/the cancer"  . Arthritis    back   . Atrial fibrillation ([place])    takes Coumadin daily  . Bruises easily    takes Coumadin daily  . CAD (coronary artery disease)    a. s/p CABG 11/2000 (L-LAD, S-RCA);  b. Lexiscan Myoview 11/12: EF 45%, ischemia and possibly some scar at the base of the inferolateral wall;   c. Lex MV 11/13:  EF 44%, Inf and IL scar/soft tissue atten, small mild amt of inf ischemia,  . Constipation    takes Colace daily  . Enlarged prostate   . Flu 09/2013  . GERD (gastroesophageal reflux disease)    takes Nexium daily  . Glaucoma   . History of blood transfusion    "related to cancer"  . History of colon polyps   . HLD (hyperlipidemia)  takes Vytorin daily  . HTN (hypertension)    takes Amlodipine,Metoprolol, and Ramipril daily  . Insomnia    states he has always been like this.But doesn't take any meds  . Kidney stones    "I passed them all"  . Nocturia   . Peripheral edema    takes Furosemide daily  . Peripheral neuropathy ([place])   . Pneumonia 09/2013  . Rheumatic fever ~ 1965  . Rheumatic heart disease    a. s/p mechanical (St. Jude) MVR 2002;  b. Echo 5/11: Mild LVH, EF 45-50%, mild AS, mild AI, mean gradient 11 mmHg, MVR with normal gradients, moderate LAE, mild RAE;  c.  Echo 11/13:   mod LVH, EF 55-60%, mild AS (mean 18 mmHg), mild AI, severe LAE, mild RAE, PASP 41  . 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 SOB (shortness of breath)    with exertion  . Type II diabetes mellitus ([place])    takes Januvia,Glipizide,and Metformin daily as well as Lantus    Past Surgical History:  Procedure Laterality Date  . APPLICATION OF WOUND VAC   2014  . CARDIAC CATHETERIZATION  2002  . COLONOSCOPY    . CORONARY ARTERY BYPASS GRAFT  2002   x 3  . ESOPHAGOGASTRODUODENOSCOPY    . HERNIA REPAIR    . INCISIONAL HERNIA REPAIR N/A 03/15/2014   Procedure: LAPAROSCOPIC INCISIONAL HERNIA;  Surgeon: Harl Bowie, MD;  Location: [place];  Service: General;  Laterality: N/A;  . INSERTION OF MESH N/A 03/15/2014   Procedure: INSERTION OF MESH;  Surgeon: Harl Bowie, MD;  Location: [place];  Service: General;  Laterality: N/A;  . LAPAROSCOPIC INCISIONAL / UMBILICAL / [place]  03/15/2014   IHR  . LAPAROTOMY N/A 02/09/2013   Procedure: EXPLORATORY LAPAROTOMY WITH incisional biopsy intra- ABDOMINAL MASS ILOCECTOMY ;  Surgeon: Harl Bowie, MD;  Location: [facility];  Service: General;  Laterality: N/A;  . MITRAL VALVE REPLACEMENT  11/2000   #33 St. Jude mechanical mitral valve prosthesis. Dr. Servando Snare  . PORT-A-CATH REMOVAL  03/15/2014  . PORT-A-CATH REMOVAL Left 03/15/2014   Procedure: REMOVAL PORT-A-CATH;  Surgeon: Harl Bowie, MD;  Location: [place];  Service: General;  Laterality: Left;  . PORTACATH PLACEMENT N/A 02/09/2013   Procedure: INSERTION PORT-A-CATH;  Surgeon: Harl Bowie, MD;  [facility];  Service: General;  Laterality: N/A;  . SEPTOPLASTY      ROS:  As stated in the HPI and negative for all other systems.  PHYSICAL EXAM BP (!) 132/50   Pulse (!) 52   Ht 6' (1.829 m)   Wt 221 lb (100.2 kg)   BMI 29.97 kg/m  GENERAL:  Well appearing and in no distress HEENT:  Pupils equal round and reactive, fundi not visualized, oral mucosa unremarkable NECK:  No jugular venous distention, waveform within normal limits, carotid upstroke brisk and symmetric, no bruits, no thyromegaly LUNGS:  Clear to auscultation bilaterally BACK:  No CVA tenderness CHEST:  Well healed sternotomy scar, porta cath.  HEART:  PMI not displaced or sustained, mechanical S1,  S2 within normal limits, no S3 , no clicks, no rubs, no  murmurs, irregular ABD:  Flat, positive bowel sounds normal in frequency in pitch, no bruits, no rebound, no guarding, no midline pulsatile mass, no hepatomegaly, no splenomegaly, obese EXT:  2 plus pulses throughout,  Mild right leg edema, no cyanosis no clubbing SKIN:  No rashes no  nodules, right leg swelling.     EKG: Atrial fibrillation, rate  51  right axis deviation, RBBB, no acute ST-T wave changes. 11/29/2016    ASSESSMENT AND PLAN  CAD - The patient has had no new symptoms since his last stress test.  His heart rate is very low.  I will stop digoxin.    ATRIAL FIBRILLATION - The patient  tolerates this rhythm and rate control and anticoagulation. We will continue with the meds as listed.  MECHANICAL MVR -  He has had normal function and tolerates anticoagulation.  He will have a follow up echocardiogram.    AORTIC VALVE STENOSIS:   This will be evaluated with the echo as above.

## 2016-11-28 NOTE — Patient Instructions (Signed)
Medication Instructions:  Please stop your Digoxin. Continue all other medications as listed.  Testing/Procedures: Your physician has requested that you have an echocardiogram. Echocardiography is a painless test that uses sound waves to create images of your heart. It provides your doctor with information about the size and shape of your heart and how well your heart's chambers and valves are working. This procedure takes approximately one hour. There are no restrictions for this procedure.  Follow-Up: Follow up in 1 year with Dr. Percival Spanish.  You will receive a letter in the mail 2 months before you are due.  Please call us when you receive this letter to schedule your follow up appointment.  If you need a refill on your cardiac medications before your next appointment, please call your pharmacy.  Thank you for choosing [facility]!!

## 2016-11-29 ENCOUNTER — Encounter: Payer: Self-pay | Admitting: Cardiology

## 2016-12-12 ENCOUNTER — Telehealth (HOSPITAL_COMMUNITY): Payer: Self-pay | Admitting: Cardiology

## 2016-12-13 ENCOUNTER — Other Ambulatory Visit (HOSPITAL_COMMUNITY): Payer: Medicare HMO

## 2016-12-17 NOTE — Telephone Encounter (Signed)
  12/12/2016 09:47 AM Phone (Outgoing) [name], John [name] (Self) [phone] (H)   Left Message - Called pt and lmsg for him to CB to r/s his echo due to the tech having a death in the family.     By Verdene Rio           Close PreviousNext

## 2016-12-26 ENCOUNTER — Other Ambulatory Visit: Payer: Self-pay

## 2016-12-26 ENCOUNTER — Ambulatory Visit (HOSPITAL_COMMUNITY): Payer: Medicare HMO | Attending: Cardiology

## 2016-12-26 DIAGNOSIS — I352 Nonrheumatic aortic (valve) stenosis with insufficiency: Secondary | ICD-10-CM | POA: Diagnosis not present

## 2016-12-26 DIAGNOSIS — I340 Nonrheumatic mitral (valve) insufficiency: Secondary | ICD-10-CM | POA: Insufficient documentation

## 2016-12-26 DIAGNOSIS — I361 Nonrheumatic tricuspid (valve) insufficiency: Secondary | ICD-10-CM | POA: Insufficient documentation

## 2016-12-26 DIAGNOSIS — Z952 Presence of prosthetic heart valve: Secondary | ICD-10-CM

## 2016-12-26 DIAGNOSIS — I422 Other hypertrophic cardiomyopathy: Secondary | ICD-10-CM | POA: Insufficient documentation

## 2016-12-26 DIAGNOSIS — I480 Paroxysmal atrial fibrillation: Secondary | ICD-10-CM

## 2016-12-26 DIAGNOSIS — I420 Dilated cardiomyopathy: Secondary | ICD-10-CM | POA: Insufficient documentation

## 2016-12-31 ENCOUNTER — Telehealth: Payer: Self-pay | Admitting: Cardiology

## 2016-12-31 NOTE — Telephone Encounter (Signed)
Pt apprised of results, voiced understanding and thanks. Recall for 4 month follow up put in calendar.

## 2016-12-31 NOTE — Telephone Encounter (Signed)
New message    Pt is returning call from [place] about his echo results.

## 2017-01-03 ENCOUNTER — Telehealth: Payer: Self-pay | Admitting: Oncology

## 2017-01-03 ENCOUNTER — Telehealth: Payer: Self-pay | Admitting: Cardiology

## 2017-01-03 ENCOUNTER — Ambulatory Visit (HOSPITAL_BASED_OUTPATIENT_CLINIC_OR_DEPARTMENT_OTHER): Payer: Medicare HMO | Admitting: Oncology

## 2017-01-03 ENCOUNTER — Other Ambulatory Visit (HOSPITAL_BASED_OUTPATIENT_CLINIC_OR_DEPARTMENT_OTHER): Payer: Medicare HMO

## 2017-01-03 VITALS — BP 148/63 | HR 80 | Temp 97.8°F | Resp 17 | Ht 72.0 in | Wt 224.5 lb

## 2017-01-03 DIAGNOSIS — E119 Type 2 diabetes mellitus without complications: Secondary | ICD-10-CM | POA: Diagnosis not present

## 2017-01-03 DIAGNOSIS — Z8572 Personal history of non-Hodgkin lymphomas: Secondary | ICD-10-CM

## 2017-01-03 DIAGNOSIS — I10 Essential (primary) hypertension: Secondary | ICD-10-CM

## 2017-01-03 DIAGNOSIS — Z862 Personal history of diseases of the blood and blood-forming organs and certain disorders involving the immune mechanism: Secondary | ICD-10-CM | POA: Diagnosis not present

## 2017-01-03 DIAGNOSIS — N189 Chronic kidney disease, unspecified: Secondary | ICD-10-CM

## 2017-01-03 DIAGNOSIS — C8590 Non-Hodgkin lymphoma, unspecified, unspecified site: Secondary | ICD-10-CM

## 2017-01-03 DIAGNOSIS — C8299 Follicular lymphoma, unspecified, extranodal and solid organ sites: Secondary | ICD-10-CM

## 2017-01-03 LAB — LACTATE DEHYDROGENASE: LDH: 273 U/L — ABNORMAL HIGH (ref 125–245)

## 2017-01-03 LAB — CBC WITH DIFFERENTIAL/PLATELET
BASO%: 0.8 % (ref 0.0–2.0)
Basophils Absolute: 0.1 10*3/uL (ref 0.0–0.1)
EOS%: 2.3 % (ref 0.0–7.0)
Eosinophils Absolute: 0.2 10*3/uL (ref 0.0–0.5)
HCT: 39.7 % (ref 38.4–49.9)
HGB: 13.1 g/dL (ref 13.0–17.1)
LYMPH%: 16 % (ref 14.0–49.0)
MCH: 29.2 pg (ref 27.2–33.4)
MCHC: 33 g/dL (ref 32.0–36.0)
MCV: 88.4 fL (ref 79.3–98.0)
MONO#: 0.8 10*3/uL (ref 0.1–0.9)
MONO%: 11.5 % (ref 0.0–14.0)
NEUT#: 4.6 10*3/uL (ref 1.5–6.5)
NEUT%: 69.4 % (ref 39.0–75.0)
Platelets: 207 10*3/uL (ref 140–400)
RBC: 4.49 10*6/uL (ref 4.20–5.82)
RDW: 14.9 % — ABNORMAL HIGH (ref 11.0–14.6)
WBC: 6.6 10*3/uL (ref 4.0–10.3)
lymph#: 1.1 10*3/uL (ref 0.9–3.3)

## 2017-01-03 LAB — COMPREHENSIVE METABOLIC PANEL
ALT: 22 U/L (ref 0–55)
AST: 24 U/L (ref 5–34)
Albumin: 3.8 g/dL (ref 3.5–5.0)
Alkaline Phosphatase: 126 U/L (ref 40–150)
Anion Gap: 11 mEq/L (ref 3–11)
BUN: 51.9 mg/dL — ABNORMAL HIGH (ref 7.0–26.0)
CO2: 25 mEq/L (ref 22–29)
Calcium: 9.3 mg/dL (ref 8.4–10.4)
Chloride: 106 mEq/L (ref 98–109)
Creatinine: 1.4 mg/dL — ABNORMAL HIGH (ref 0.7–1.3)
EGFR: 55 mL/min/{1.73_m2} — ABNORMAL LOW (ref >90)
Glucose: 222 mg/dl — ABNORMAL HIGH (ref 70–140)
Potassium: 4.5 mEq/L (ref 3.5–5.1)
Sodium: 141 mEq/L (ref 136–145)
Total Bilirubin: 0.42 mg/dL (ref 0.20–1.20)
Total Protein: 7.2 g/dL (ref 6.4–8.3)

## 2017-01-03 NOTE — Telephone Encounter (Signed)
Pt aware of echo results and was scheduled for f/u in [place] in */2018 as requested.

## 2017-01-03 NOTE — Progress Notes (Signed)
Hematology and Oncology Follow Up Visit  John [name] [id number] 1950/11/27 66 y.o. 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   Prior Therapy: He is status post exploratory laparotomy and excision of an intra-abdominal mass and ileocolectomy and a Port-A-Cath placement on 02/09/2013 Chemotherapy with CHOP-R cycle one given on 04/16/2013. He is status post 4 cycles of chemotherapy completed in 06/2013.  Current therapy:  Observation and surveillance.  Interim History:  John [name] presents today for a followup visit with his wife. Since the last visit, he has no major changes in his health. He died abdominal pain or discomfort. He reports no early satiety or change in his bowel habits. He denies fevers, chills, sweats or other constitutional symptoms. He performs activities of daily living without any decline. He continues to be active outside of his house without any decline in ability to do so. He remains on warfarin for his prosthetic valve and continues to follow with cardiology regarding his other cardiac issues. He had an echocardiogram last week which showed decreased EF although he has no signs or symptoms of congestive heart failure.   He does not report any headaches, blurry vision, syncope or seizures. He denies any problems with nausea or vomiting. He did not report any lymphadenopathy or easy bruisability. Has not reported any petechiae or bleeding. He does not report any chest pain, palpitation orthopnea. Does not report any cough or hemoptysis. He does not report any frequency urgency or hesitancy. Rest of his review of systems unremarkable.  Medications: I have reviewed the patient's current medications.  Current Outpatient Prescriptions  Medication Sig Dispense Refill  .  amLODipine (NORVASC) 5 MG tablet Take 5 mg by mouth daily.    Marland Kitchen aspirin 81 MG chewable tablet Chew 81 mg by mouth daily.    Marland Kitchen esomeprazole (NEXIUM) 40 MG capsule Take 40 mg by mouth at bedtime.     Marland Kitchen ezetimibe-simvastatin (VYTORIN) 10-40 MG per tablet Take 1 tablet by mouth at bedtime.    . furosemide (LASIX) 40 MG tablet Take 40 mg by mouth 2 (two) times daily.     Marland Kitchen glipiZIDE (GLUCOTROL XL) 10 MG 24 hr tablet Take 10 mg by mouth daily.     . insulin glargine (LANTUS) 100 UNIT/ML injection Inject 75 Units into the skin 2 (two) times daily.    Marland Kitchen losartan (COZAAR) 100 MG tablet Take 100 mg by mouth daily.     . metFORMIN (GLUCOPHAGE) 1000 MG tablet Take 1,000 mg by mouth 2 (two) times daily.     . metoprolol (TOPROL-XL) 200 MG 24 hr tablet Take 200 mg by mouth daily after breakfast.     . Misc Natural Products (OSTEO BI-FLEX ADV TRIPLE ST) TABS Take 500 each by mouth daily as needed (joint pain).     . potassium chloride SA (K-DUR,KLOR-CON) 20 MEQ tablet Take 10 mEq by mouth 3 (three) times a week. Takes Mondays, Wednesdays, and Saturdays.    . pravastatin (PRAVACHOL) 40 MG tablet Take 40 mg by mouth daily.     . sitaGLIPtin (JANUVIA) 100 MG tablet Take 100 mg by mouth daily.     Marland Kitchen warfarin (COUMADIN) 5 MG tablet Take 5 mg by mouth daily.     . nitroGLYCERIN (NITROSTAT) 0.4 MG SL tablet Place 0.4 mg under the tongue every 5 (  five) minutes as needed for chest pain.     No current facility-administered medications for this visit.      Allergies:  Allergies  Allergen Reactions  . Doxycycline Hives    Past Medical History, Surgical history, Social history, and Family History were reviewed and updated.    Physical Exam: Blood pressure (!) 148/63, pulse 80, temperature 97.8 F (36.6 C), temperature source Oral, resp. rate 17, height 6' (1.829 m), weight 224 lb 8 oz (101.8 kg), SpO2 98 %. ECOG: 1 General appearance: Awake gentleman without distress. Head: Normocephalic, without obvious  abnormality  Oral mucosa without thrush. Neck: no adenopathy masses. Lymph nodes: Cervical, supraclavicular, and axillary nodes normal. Heart: Bradycardic and have regular. metallic clips noted.  Chest wall examination revealed  well-healed scar.  Lung:chest clear, no wheezing, rales, normal symmetric air entry Abdomen: soft, non-tender, without masses or organomegaly no rebound or guarding  EXT:no erythema, induration, or nodules   Lab Results: Lab Results  Component Value Date   WBC 6.6 01/03/2017   HGB 13.1 01/03/2017   HCT 39.7 01/03/2017   MCV 88.4 01/03/2017   PLT 207 01/03/2017     Chemistry      Component Value Date/Time   NA 143 07/05/2016 0805   K 4.8 07/05/2016 0805   CL 97 03/17/2014 0010   CL 106 01/21/2013 1050   CO2 25 07/05/2016 0805   BUN 53.0 (H) 07/05/2016 0805   CREATININE 1.4 (H) 07/05/2016 0805      Component Value Date/Time   CALCIUM 9.2 07/05/2016 0805   ALKPHOS 129 07/05/2016 0805   AST 19 07/05/2016 0805   ALT 17 07/05/2016 0805   BILITOT 0.51 07/05/2016 0805       Impression and Plan:  66 year old gentleman with the following issues:  1. 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 He is status post excisional biopsy and ileocolectomy followed by systemic chemotherapy. Imaging studies following that showed a complete response and have been in remission since October 2014.  CT scan on 06/21/2015 continues to show no evidence of disease relapse.   His physical examination and laboratory data today were reviewed and showed no evidence of recurrent disease. The plan is to continue with active surveillance and repeat imaging studies as needed.   2. Anemia: Again on 01/03/2017 was within normal range.  3. Chronic renal insufficiency: Likely related to long-standing hypertension and diabetes with creatinine close to baseline.  4. Colon polyps: He is up-to-date on colonoscopy  screening.  5. Followup: Will be in 6 months.      John [name] 4/26/20189:05 AM

## 2017-01-03 NOTE — Telephone Encounter (Signed)
Follow Up:    Pt returning your call,concerning his test results.

## 2017-01-03 NOTE — Telephone Encounter (Signed)
Gave patient AVS and calender per 4/26 los.  

## 2017-04-16 NOTE — Progress Notes (Signed)
HPI The patient presents for follow of MVR and atrial fib.  After the last visit I ordered an echo which demonstrated a stable MVR and stable mild aortic valve disease.  However, the EF was moderately reduced.  His EF at the last echo was 50 although it had been reduced in the more distant past.  The patient denies any new symptoms such as chest discomfort, neck or arm discomfort. There has been no new shortness of breath, PND or orthopnea. There have been no reported palpitations, presyncope or syncope.  However, when pressed he said that he did do a little hike recently and was more SOB than he thought he should be and he has had some increased edema.    Allergies  Allergen Reactions  . Doxycycline Hives    Current Outpatient Prescriptions  Medication Sig Dispense Refill  . amLODipine (NORVASC) 5 MG tablet Take 5 mg by mouth daily.    Marland Kitchen aspirin 81 MG chewable tablet Chew 81 mg by mouth daily.    Marland Kitchen esomeprazole (NEXIUM) 40 MG capsule Take 40 mg by mouth at bedtime.     . furosemide (LASIX) 40 MG tablet Take 40 mg by mouth 2 (two) times daily.     Marland Kitchen glipiZIDE (GLUCOTROL XL) 10 MG 24 hr tablet Take 10 mg by mouth daily.     . insulin glargine (LANTUS) 100 UNIT/ML injection Inject 85 Units into the skin 2 (two) times daily.     Marland Kitchen losartan (COZAAR) 100 MG tablet Take 50 mg by mouth daily.     . metFORMIN (GLUCOPHAGE) 1000 MG tablet Take 1,000 mg by mouth 2 (two) times daily.     . metoprolol (TOPROL-XL) 200 MG 24 hr tablet Take 200 mg by mouth daily after breakfast.     . Misc Natural Products (OSTEO BI-FLEX ADV TRIPLE ST) TABS Take 500 each by mouth daily as needed (joint pain).     . nitroGLYCERIN (NITROSTAT) 0.4 MG SL tablet Place 0.4 mg under the tongue every 5 (five) minutes as needed for chest pain.    . pravastatin (PRAVACHOL) 80 MG tablet Take 80 mg by mouth daily.     . sitaGLIPtin (JANUVIA) 100 MG tablet Take 50 mg by mouth daily.     Marland Kitchen warfarin (COUMADIN) 5 MG tablet Take 5 mg  by mouth daily.     Marland Kitchen ezetimibe-simvastatin (VYTORIN) 10-40 MG per tablet Take 1 tablet by mouth at bedtime.    Marland Kitchen spironolactone (ALDACTONE) 25 MG tablet Take 1 tablet (25 mg total) by mouth daily. 90 tablet 3  . VIAGRA 100 MG tablet Take 100 mg by mouth as needed for erectile dysfunction.  3   No current facility-administered medications for this visit.     Past Medical History:  Diagnosis Date  . Anemia    "lost blood w/the cancer"  . Arthritis    back   . Atrial fibrillation ([place])    takes Coumadin daily  . Bruises easily    takes Coumadin daily  . CAD (coronary artery disease)    a. s/p CABG 11/2000 (L-LAD, S-RCA);  b. Lexiscan Myoview 11/12: EF 45%, ischemia and possibly some scar at the base of the inferolateral wall;   c. Lex MV 11/13:  EF 44%, Inf and IL scar/soft tissue atten, small mild amt of inf ischemia,  . Constipation    takes Colace daily  . Enlarged prostate   . Flu 09/2013  . GERD (gastroesophageal reflux disease)  takes Nexium daily  . Glaucoma   . History of blood transfusion    "related to cancer"  . History of colon polyps   . HLD (hyperlipidemia)    takes Vytorin daily  . HTN (hypertension)    takes Amlodipine,Metoprolol, and Ramipril daily  . Insomnia    states he has always been like this.But doesn't take any meds  . Kidney stones    "I passed them all"  . Nocturia   . Peripheral edema    takes Furosemide daily  . Peripheral neuropathy   . Rheumatic fever ~ 1965  . Rheumatic heart disease    a. s/p mechanical (St. Jude) MVR 2002;  b. Echo 5/11: Mild LVH, EF 45-50%, mild AS, mild AI, mean gradient 11 mmHg, MVR with normal gradients, moderate LAE, mild RAE;  c.  Echo 11/13:   mod LVH, EF 55-60%, mild AS (mean 18 mmHg), mild AI, severe LAE, mild RAE, PASP 41  . 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 SOB (shortness of breath)    with exertion  . Type II diabetes mellitus ([place])    takes Januvia,Glipizide,and  Metformin daily as well as Lantus    Past Surgical History:  Procedure Laterality Date  . APPLICATION OF WOUND VAC  2014  . CARDIAC CATHETERIZATION  2002  . COLONOSCOPY    . CORONARY ARTERY BYPASS GRAFT  2002   x 3  . ESOPHAGOGASTRODUODENOSCOPY    . HERNIA REPAIR    . INCISIONAL HERNIA REPAIR N/A 03/15/2014   Procedure: LAPAROSCOPIC INCISIONAL HERNIA;  Surgeon: Harl Bowie, MD;  Location: [facility];  Service: General;  Laterality: N/A;  . INSERTION OF MESH N/A 03/15/2014   Procedure: INSERTION OF MESH;  Surgeon: Harl Bowie, MD;  Location: [facility];  Service: General;  Laterality: N/A;  . LAPAROSCOPIC INCISIONAL / UMBILICAL / [place]  03/15/2014   IHR  . LAPAROTOMY N/A 02/09/2013   Procedure: EXPLORATORY LAPAROTOMY WITH incisional biopsy intra- ABDOMINAL MASS ILOCECTOMY ;  Surgeon: Harl Bowie, MD;  Location: [facility];  Service: General;  Laterality: N/A;  . MITRAL VALVE REPLACEMENT  11/2000   #33 St. Jude mechanical mitral valve prosthesis. Dr. Servando Snare  . PORT-A-CATH REMOVAL  03/15/2014  . PORT-A-CATH REMOVAL Left 03/15/2014   Procedure: REMOVAL PORT-A-CATH;  Surgeon: Harl Bowie, MD;  Location: [facility];  Service: General;  Laterality: Left;  . PORTACATH PLACEMENT N/A 02/09/2013   Procedure: INSERTION PORT-A-CATH;  Surgeon: Harl Bowie, MD;  Location: [facility];  Service: General;  Laterality: N/A;  . SEPTOPLASTY      ROS:  As stated in the HPI and negative for all other systems.  PHYSICAL EXAM BP (!) 142/70   Pulse 72   Ht 6' (1.829 m)   Wt 226 lb (102.5 kg)   BMI 30.65 kg/m   GENERAL:  Well appearing NECK:  No jugular venous distention, waveform within normal limits, carotid upstroke brisk and symmetric, no bruits, no thyromegaly LUNGS:  Clear to auscultation bilaterally CHEST:  Well healed sternotomy scar.  BACK:  No CVA tenderness CHEST:  Unremarkable HEART:  PMI not displaced or sustained, mechanical S1 and S2 within normal limits, no S3, no S4,  no clicks, no rubs, 2/6 systolic murmur, no diastolic murmurs ABD:  Flat, positive bowel sounds normal in frequency in pitch, no bruits, no rebound, no guarding, no midline pulsatile mass, no hepatomegaly, no splenomegaly EXT:  2 plus pulses  throughout, mild right leg edema, no cyanosis no clubbing, chronic venous stasis changes.     EKG: Atrial fibrillation, rate  73 right axis deviation, RBBB, no acute ST-T wave changes. 04/17/2017    ASSESSMENT AND PLAN  CARDIOMYOPATHY -  The EF was moderately reduced on echo this spring.  I will follow up with a Lexiscan Myoview.  (He would not be able to talk on a treadmill.)  I will start spironolactone.     I will follow guidelines for spironolactone follow up in heart failure.  (Check potassium levels and  renal function 3--4 days and 1 week, then at least monthly for first 3 months and every 3 months thereafter after initiation of spironolactone.)  Of note he had mild CKD on labs 8/6 with creat of 1.41.  I reviewed these labs.  Potassium was OK.    CAD - This will be evaluated as above.   ATRIAL FIBRILLATION - He tolerates anticoagulation.  He will continue on the meds as listed.d.  MECHANICAL MVR -  He had a stable mitral valve on echo in April. No change in therapy.   AORTIC VALVE STENOSIS:   He had mild AS and AI on the echo.  I will follow this clinically.

## 2017-04-17 ENCOUNTER — Telehealth (HOSPITAL_COMMUNITY): Payer: Self-pay | Admitting: Cardiology

## 2017-04-17 ENCOUNTER — Ambulatory Visit (INDEPENDENT_AMBULATORY_CARE_PROVIDER_SITE_OTHER): Payer: Medicare HMO | Admitting: Cardiology

## 2017-04-17 ENCOUNTER — Encounter: Payer: Self-pay | Admitting: Cardiology

## 2017-04-17 VITALS — BP 142/70 | HR 72 | Ht 72.0 in | Wt 226.0 lb

## 2017-04-17 DIAGNOSIS — I255 Ischemic cardiomyopathy: Secondary | ICD-10-CM

## 2017-04-17 DIAGNOSIS — Z952 Presence of prosthetic heart valve: Secondary | ICD-10-CM

## 2017-04-17 DIAGNOSIS — R0609 Other forms of dyspnea: Secondary | ICD-10-CM | POA: Diagnosis not present

## 2017-04-17 DIAGNOSIS — I480 Paroxysmal atrial fibrillation: Secondary | ICD-10-CM

## 2017-04-17 MED ORDER — SPIRONOLACTONE 25 MG PO TABS: 25.0000 mg | ORAL_TABLET | Freq: Every day | ORAL | 3 refills | Status: DC

## 2017-04-17 NOTE — Patient Instructions (Signed)
Medication Instructions:  Please start Spironolactone 25 mg a day.  Continue all other medications as listed.  Labwork: Please have blood work Monday, the following Monday and in 3 months with results faxed to [phone]  Testing/Procedures: Your physician has requested that you have a lexiscan myoview. For further information please visit [email or web address]. Please follow instruction sheet, as given.  Follow-Up: Follow up in 2 months with Dr Percival Spanish in [place].  If you need a refill on your cardiac medications before your next appointment, please call your pharmacy.  Thank you for choosing [facility]!!

## 2017-04-19 NOTE — Telephone Encounter (Signed)
User: Cherie Dark A Date/time: 04/17/17 11:05 AM  Comment: Returned patient call to sche his myoview.   Context:  Outcome: Left Message  Phone number: [phone] Phone Type: Home Phone  Comm. type: Telephone Call type: Outgoing  Contact: Apperson, Jourdain L Relation to patient: Self    User: Cherie Dark A Date/time: 04/17/17 10:46 AM  Comment: Called pt and lmsg for him to CB to schedule myoview.   Context:  Outcome: Left Message  Phone number: [phone] Phone Type: Home Phone  Comm. type: Telephone Call type: Outgoing  Contact: Hogge, Keanan L Relation to patient: Self    Patient Demographics for John [name], John [name] [[id number]]

## 2017-04-22 ENCOUNTER — Ambulatory Visit (HOSPITAL_COMMUNITY): Payer: Medicare HMO | Attending: Cardiovascular Disease

## 2017-04-22 DIAGNOSIS — I4891 Unspecified atrial fibrillation: Secondary | ICD-10-CM | POA: Diagnosis not present

## 2017-04-22 DIAGNOSIS — R0609 Other forms of dyspnea: Secondary | ICD-10-CM | POA: Diagnosis not present

## 2017-04-22 DIAGNOSIS — I480 Paroxysmal atrial fibrillation: Secondary | ICD-10-CM

## 2017-04-22 DIAGNOSIS — I429 Cardiomyopathy, unspecified: Secondary | ICD-10-CM | POA: Insufficient documentation

## 2017-04-22 DIAGNOSIS — R079 Chest pain, unspecified: Secondary | ICD-10-CM | POA: Diagnosis not present

## 2017-04-22 DIAGNOSIS — I10 Essential (primary) hypertension: Secondary | ICD-10-CM | POA: Diagnosis not present

## 2017-04-22 DIAGNOSIS — I2510 Atherosclerotic heart disease of native coronary artery without angina pectoris: Secondary | ICD-10-CM | POA: Insufficient documentation

## 2017-04-22 DIAGNOSIS — E109 Type 1 diabetes mellitus without complications: Secondary | ICD-10-CM | POA: Diagnosis not present

## 2017-04-22 LAB — MYOCARDIAL PERFUSION IMAGING
LV dias vol: 363 mL (ref 62–150)
LV sys vol: 287 mL
Peak HR: 89 {beats}/min
RATE: 0.43
Rest HR: 70 {beats}/min
SDS: 6
SRS: 10
SSS: 12
TID: 1.06

## 2017-04-22 MED ORDER — TECHNETIUM TC 99M TETROFOSMIN IV KIT
32.0000 | PACK | Freq: Once | INTRAVENOUS | Status: AC | PRN
  Administered 2017-04-22: 32 via INTRAVENOUS
  Filled 2017-04-22: qty 32

## 2017-04-22 MED ORDER — REGADENOSON 0.4 MG/5ML IV SOLN
0.4000 mg | Freq: Once | INTRAVENOUS | Status: AC
  Administered 2017-04-22: 0.4 mg via INTRAVENOUS

## 2017-04-22 MED ORDER — TECHNETIUM TC 99M TETROFOSMIN IV KIT
10.8000 | PACK | Freq: Once | INTRAVENOUS | Status: AC | PRN
  Administered 2017-04-22: 10.8 via INTRAVENOUS
  Filled 2017-04-22: qty 11

## 2017-05-01 ENCOUNTER — Encounter: Payer: Self-pay | Admitting: *Deleted

## 2017-05-01 ENCOUNTER — Ambulatory Visit (INDEPENDENT_AMBULATORY_CARE_PROVIDER_SITE_OTHER): Payer: Medicare HMO | Admitting: Cardiology

## 2017-05-01 ENCOUNTER — Encounter: Payer: Self-pay | Admitting: Cardiology

## 2017-05-01 VITALS — BP 128/58 | HR 68 | Ht 72.0 in | Wt 226.0 lb

## 2017-05-01 DIAGNOSIS — R0602 Shortness of breath: Secondary | ICD-10-CM | POA: Diagnosis not present

## 2017-05-01 DIAGNOSIS — Z01818 Encounter for other preprocedural examination: Secondary | ICD-10-CM

## 2017-05-01 DIAGNOSIS — I10 Essential (primary) hypertension: Secondary | ICD-10-CM | POA: Diagnosis not present

## 2017-05-01 DIAGNOSIS — I255 Ischemic cardiomyopathy: Secondary | ICD-10-CM

## 2017-05-01 NOTE — Patient Instructions (Addendum)
Medication Instructions:  The current medical regimen is effective;  continue present plan and medications.  Labwork: Please have blood work on Monday May 06, 2017 when you go to the [facility] to discuss Lovenox bridging.  Testing/Procedures: Your physician has requested that you have a cardiac catheterization. Cardiac catheterization is used to diagnose and/or treat various heart conditions. Doctors may recommend this procedure for a number of different reasons. The most common reason is to evaluate chest pain. Chest pain can be a symptom of coronary artery disease (CAD), and cardiac catheterization can show whether plaque is narrowing or blocking your heart's arteries. This procedure is also used to evaluate the valves, as well as measure the blood flow and oxygen levels in different parts of your heart. For further information please visit [email or web address]. Please follow instruction sheet, as given.  Follow-Up: Follow up with Dr Percival Spanish after your cardiac cath.  If you need a refill on your cardiac medications before your next appointment, please call your pharmacy.  Thank you for choosing [facility]!!

## 2017-05-01 NOTE — Progress Notes (Signed)
HPI The patient presents for follow of MVR and atrial fib.  After the last visit I ordered an echo which demonstrated a stable MVR and stable mild aortic valve disease.  However, the EF was moderately reduced.  His EF at the previous echo was 50 although it had been reduced in the more distant past.  It was now 35%.  I sent him for a Lexiscan Myoview.  He had a reduced EF with a new anterior wall perfusion defect that was not present on the previous perfusion study.  I brought him back to discuss this.  He does have more dyspnea than he used to have. He'll be short of breath doing moderate activity. As mentioned in previous note he was much more short of breath with mild hike recently.  He's not describing PND or orthopnea. He's not been having any chest discomfort other than a little sharp aching when he started spironolactone recently. He denies any palpitations, presyncope or syncope. He has some mild chronic lower extremity edema. He's actually lost a little weight.  Allergies  Allergen Reactions  . Doxycycline Hives    Current Outpatient Prescriptions  Medication Sig Dispense Refill  . amLODipine (NORVASC) 5 MG tablet Take 5 mg by mouth daily.    Marland Kitchen aspirin 81 MG chewable tablet Chew 81 mg by mouth daily.    Marland Kitchen esomeprazole (NEXIUM) 40 MG capsule Take 40 mg by mouth at bedtime.     . furosemide (LASIX) 40 MG tablet Take 40 mg by mouth 2 (two) times daily.     Marland Kitchen glipiZIDE (GLUCOTROL XL) 10 MG 24 hr tablet Take 10 mg by mouth daily.     . insulin glargine (LANTUS) 100 UNIT/ML injection Inject 85 Units into the skin 2 (two) times daily.     Marland Kitchen losartan (COZAAR) 100 MG tablet Take 50 mg by mouth daily.     . metFORMIN (GLUCOPHAGE) 1000 MG tablet Take 1,000 mg by mouth 2 (two) times daily.     . metoprolol (TOPROL-XL) 200 MG 24 hr tablet Take 200 mg by mouth daily after breakfast.     . Misc Natural Products (OSTEO BI-FLEX ADV TRIPLE ST) TABS Take 500 each by mouth daily as needed (joint  pain).     . nitroGLYCERIN (NITROSTAT) 0.4 MG SL tablet Place 0.4 mg under the tongue every 5 (five) minutes as needed for chest pain.    . pravastatin (PRAVACHOL) 80 MG tablet Take 80 mg by mouth daily.     . sitaGLIPtin (JANUVIA) 100 MG tablet Take 50 mg by mouth daily.     Marland Kitchen spironolactone (ALDACTONE) 25 MG tablet Take 1 tablet (25 mg total) by mouth daily. 90 tablet 3  . VIAGRA 100 MG tablet Take 100 mg by mouth as needed for erectile dysfunction.  3  . warfarin (COUMADIN) 5 MG tablet Take 5 mg by mouth daily.      No current facility-administered medications for this visit.     Past Medical History:  Diagnosis Date  . Anemia    "lost blood w/the cancer"  . Arthritis    back   . Atrial fibrillation ([place])    takes Coumadin daily  . Bruises easily    takes Coumadin daily  . CAD (coronary artery disease)    a. s/p CABG 11/2000 (L-LAD, S-RCA);  b. Lexiscan Myoview 11/12: EF 45%, ischemia and possibly some scar at the base of the inferolateral wall;   c. Lex MV 11/13:  EF  44%, Inf and IL scar/soft tissue atten, small mild amt of inf ischemia,  . Constipation    takes Colace daily  . Enlarged prostate   . Flu 09/2013  . GERD (gastroesophageal reflux disease)    takes Nexium daily  . Glaucoma   . History of blood transfusion    "related to cancer"  . History of colon polyps   . HLD (hyperlipidemia)    takes Vytorin daily  . HTN (hypertension)    takes Amlodipine,Metoprolol, and Ramipril daily  . Insomnia    states he has always been like this.But doesn't take any meds  . Kidney stones    "I passed them all"  . Nocturia   . Peripheral edema    takes Furosemide daily  . Peripheral neuropathy   . Rheumatic fever ~ 1965  . Rheumatic heart disease    a. s/p mechanical (St. Jude) MVR 2002;  b. Echo 5/11: Mild LVH, EF 45-50%, mild AS, mild AI, mean gradient 11 mmHg, MVR with normal gradients, moderate LAE, mild RAE;  c.  Echo 11/13:   mod LVH, EF 55-60%, mild AS (mean 18 mmHg),  mild AI, severe LAE, mild RAE, PASP 41  . 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 SOB (shortness of breath)    with exertion  . Type II diabetes mellitus ([place])    takes Januvia,Glipizide,and Metformin daily as well as Lantus    Past Surgical History:  Procedure Laterality Date  . APPLICATION OF WOUND VAC  2014  . CARDIAC CATHETERIZATION  2002  . COLONOSCOPY    . CORONARY ARTERY BYPASS GRAFT  2002   x 3  . ESOPHAGOGASTRODUODENOSCOPY    . HERNIA REPAIR    . INCISIONAL HERNIA REPAIR N/A 03/15/2014   Procedure: LAPAROSCOPIC INCISIONAL HERNIA;  Surgeon: Harl Bowie, MD;  Location: [facility];  Service: General;  Laterality: N/A;  . INSERTION OF MESH N/A 03/15/2014   Procedure: INSERTION OF MESH;  Surgeon: Harl Bowie, MD;  Location: [facility];  Service: General;  Laterality: N/A;  . LAPAROSCOPIC INCISIONAL / UMBILICAL / [place]  03/15/2014   IHR  . LAPAROTOMY N/A 02/09/2013   Procedure: EXPLORATORY LAPAROTOMY WITH incisional biopsy intra- ABDOMINAL MASS ILOCECTOMY ;  Surgeon: Harl Bowie, MD;  [facility];  Service: General;  Laterality: N/A;  . MITRAL VALVE REPLACEMENT  11/2000   #33 St. Jude mechanical mitral valve prosthesis. Dr. Servando Snare  . PORT-A-CATH REMOVAL  03/15/2014  . PORT-A-CATH REMOVAL Left 03/15/2014   Procedure: REMOVAL PORT-A-CATH;  Surgeon: Harl Bowie, MD;  Location: [facility];  Service: General;  Laterality: Left;  . PORTACATH PLACEMENT N/A 02/09/2013   Procedure: INSERTION PORT-A-CATH;  Surgeon: Harl Bowie, MD;  [facility];  Service: General;  Laterality: N/A;  . SEPTOPLASTY      ROS: As stated in the HPI and negative for all other systems.  PHYSICAL EXAM BP (!) 128/58   Pulse 68   Ht 6' (1.829 m)   Wt 226 lb (102.5 kg)   BMI 30.65 kg/m   GENERAL:  Well appearing NECK:  No jugular venous distention, waveform within normal limits, carotid upstroke brisk and symmetric, no bruits, no  thyromegaly LUNGS:  Clear to auscultation bilaterally CHEST:  Unremarkable HEART:  PMI not displaced or sustained, mechanic S1 and normal S2 within normal limits, no S3, no S4, no clicks, no rubs, 2 out of 6 apical systolic murmur, no  diastolic murmurs ABD:  Flat, positive bowel sounds normal in frequency in pitch, no bruits, no rebound, no guarding, no midline pulsatile mass, no hepatomegaly, no splenomegaly EXT:  2 plus pulses throughout, mild right leg edema, no cyanosis no clubbing NEURO:  nonfocal SKIN:  Venous stasis changes.   EKG:  NA    ASSESSMENT AND PLAN  CARDIOMYOPATHY -  The EF was moderately reduced on echo this spring.  He had a reduced EF with a new anterior wall perfusion defect that was not present on the previous perfusion study. He has dyspnea. I would suggest a right and left heart cath. He would need to have labs and bridging anticoagulation.  Cardiac cath is indicated.  He understands that this is higher risk with his renal insufficiency. I would recommend early in the morning for hydration and did Later in the morning. He would hold his diuretic the day before the procedure and his ARB. He would of course hold his metformin. We will coordinate his bridging Lovenox through our Coumadin clinic. The patient understands that risks included but are not limited to stroke (1 in 1000), death (1 in 33), kidney failure [usually temporary] (1 in 500), bleeding (1 in 200), allergic reaction [possibly serious] (1 in 200).  The patient understands and agrees to proceed.   CAD - This will be evaluated as above.   ATRIAL FIBRILLATION - I will bridge his warfarin as abobve  MECHANICAL MVR -  He had a stable mitral valve on echo in April. No change in therapy.  AORTIC VALVE STENOSIS:   He had mild AS and AI on the echo.  I will follow this clinically.

## 2017-05-06 ENCOUNTER — Ambulatory Visit (INDEPENDENT_AMBULATORY_CARE_PROVIDER_SITE_OTHER): Payer: Medicare HMO | Admitting: Pharmacist

## 2017-05-06 ENCOUNTER — Other Ambulatory Visit: Payer: Medicare HMO | Admitting: *Deleted

## 2017-05-06 DIAGNOSIS — I482 Chronic atrial fibrillation, unspecified: Secondary | ICD-10-CM

## 2017-05-06 DIAGNOSIS — Z5181 Encounter for therapeutic drug level monitoring: Secondary | ICD-10-CM | POA: Diagnosis not present

## 2017-05-06 DIAGNOSIS — I10 Essential (primary) hypertension: Secondary | ICD-10-CM

## 2017-05-06 DIAGNOSIS — Z9889 Other specified postprocedural states: Secondary | ICD-10-CM

## 2017-05-06 DIAGNOSIS — Z01818 Encounter for other preprocedural examination: Secondary | ICD-10-CM

## 2017-05-06 LAB — POCT INR: INR: 4.5

## 2017-05-06 MED ORDER — ENOXAPARIN SODIUM 100 MG/ML ~~LOC~~ SOLN: 100.0000 mg | Freq: Two times a day (BID) | SUBCUTANEOUS | 0 refills | Status: DC

## 2017-05-06 NOTE — Patient Instructions (Addendum)
8/27: Do not take Coumadin or Lovenox  8/28: Do not take Coumadin or Lovenox  8/29: Inject Lovenox 100mg  in the fatty abdominal tissue at least 2 inches from the belly button twice a day about 12 hours apart, 8am and 8pm rotate sites. No Coumadin.  8/30: Inject Lovenox in the fatty tissue at 8am. Do NOT take evening dose. No Coumadin.  8/31: Procedure Day - No Lovenox - Resume Coumadin in the evening or as directed by doctor.  9/1: Resume Lovenox inject in the fatty tissue every 12 hours and take Coumadin.  9/2: Inject Lovenox in the fatty tissue every 12 hours and take Coumadin.  9/3: Inject Lovenox in the fatty tissue every 12 hours and take Coumadin.  9/4: Inject Lovenox in the fatty tissue every 12 hours and take Coumadin.  9/5: Coumadin appt to check INR.

## 2017-05-06 NOTE — Progress Notes (Signed)
mp

## 2017-05-06 NOTE — Addendum Note (Signed)
Addended by: Eulis Foster on: 05/06/2017 01:57 PM   Modules accepted: Orders

## 2017-05-07 LAB — BASIC METABOLIC PANEL
BUN/Creatinine Ratio: 41 — ABNORMAL HIGH (ref 10–24)
BUN: 50 mg/dL — ABNORMAL HIGH (ref 8–27)
CO2: 23 mmol/L (ref 20–29)
Calcium: 9.2 mg/dL (ref 8.6–10.2)
Chloride: 99 mmol/L (ref 96–106)
Creatinine, Ser: 1.23 mg/dL (ref 0.76–1.27)
GFR calc Af Amer: 71 mL/min/{1.73_m2} (ref >59)
GFR calc non Af Amer: 62 mL/min/{1.73_m2} (ref >59)
Glucose: 54 mg/dL — ABNORMAL LOW (ref 65–99)
Potassium: 4.3 mmol/L (ref 3.5–5.2)
Sodium: 142 mmol/L (ref 134–144)

## 2017-05-07 LAB — CBC WITH DIFFERENTIAL/PLATELET
Basophils Absolute: 0.1 10*3/uL (ref 0.0–0.2)
Basos: 1 %
EOS (ABSOLUTE): 0.2 10*3/uL (ref 0.0–0.4)
Eos: 3 %
Hematocrit: 42.8 % (ref 37.5–51.0)
Hemoglobin: 14.1 g/dL (ref 13.0–17.7)
Immature Grans (Abs): 0 10*3/uL (ref 0.0–0.1)
Immature Granulocytes: 0 %
Lymphocytes Absolute: 2.2 10*3/uL (ref 0.7–3.1)
Lymphs: 26 %
MCH: 28.9 pg (ref 26.6–33.0)
MCHC: 32.9 g/dL (ref 31.5–35.7)
MCV: 88 fL (ref 79–97)
Monocytes Absolute: 0.8 10*3/uL (ref 0.1–0.9)
Monocytes: 9 %
Neutrophils Absolute: 5.2 10*3/uL (ref 1.4–7.0)
Neutrophils: 61 %
Platelets: 212 10*3/uL (ref 150–379)
RBC: 4.88 x10E6/uL (ref 4.14–5.80)
RDW: 16.9 % — ABNORMAL HIGH (ref 12.3–15.4)
WBC: 8.4 10*3/uL (ref 3.4–10.8)

## 2017-05-10 ENCOUNTER — Ambulatory Visit (HOSPITAL_COMMUNITY)
Admission: RE | Admit: 2017-05-10 | Discharge: 2017-05-10 | Disposition: A | Discharge status: Home / Self Care | Payer: Medicare HMO | Source: Ambulatory Visit | Attending: Cardiology | Admitting: Cardiology

## 2017-05-10 ENCOUNTER — Encounter (HOSPITAL_COMMUNITY): Payer: Self-pay | Admitting: Cardiology

## 2017-05-10 ENCOUNTER — Encounter (HOSPITAL_COMMUNITY)
Admission: RE | Disposition: A | Discharge status: Home / Self Care | Payer: Self-pay | Source: Ambulatory Visit | Attending: Cardiology

## 2017-05-10 DIAGNOSIS — I2582 Chronic total occlusion of coronary artery: Secondary | ICD-10-CM | POA: Diagnosis not present

## 2017-05-10 DIAGNOSIS — I2510 Atherosclerotic heart disease of native coronary artery without angina pectoris: Secondary | ICD-10-CM | POA: Insufficient documentation

## 2017-05-10 DIAGNOSIS — I25810 Atherosclerosis of coronary artery bypass graft(s) without angina pectoris: Secondary | ICD-10-CM | POA: Diagnosis not present

## 2017-05-10 DIAGNOSIS — I272 Pulmonary hypertension, unspecified: Secondary | ICD-10-CM | POA: Diagnosis not present

## 2017-05-10 DIAGNOSIS — I209 Angina pectoris, unspecified: Secondary | ICD-10-CM | POA: Diagnosis present

## 2017-05-10 DIAGNOSIS — Z85068 Personal history of other malignant neoplasm of small intestine: Secondary | ICD-10-CM | POA: Insufficient documentation

## 2017-05-10 DIAGNOSIS — E785 Hyperlipidemia, unspecified: Secondary | ICD-10-CM | POA: Insufficient documentation

## 2017-05-10 DIAGNOSIS — R0609 Other forms of dyspnea: Secondary | ICD-10-CM | POA: Diagnosis present

## 2017-05-10 DIAGNOSIS — I482 Chronic atrial fibrillation, unspecified: Secondary | ICD-10-CM | POA: Diagnosis present

## 2017-05-10 DIAGNOSIS — E1140 Type 2 diabetes mellitus with diabetic neuropathy, unspecified: Secondary | ICD-10-CM | POA: Insufficient documentation

## 2017-05-10 DIAGNOSIS — I10 Essential (primary) hypertension: Secondary | ICD-10-CM | POA: Diagnosis not present

## 2017-05-10 DIAGNOSIS — Z7982 Long term (current) use of aspirin: Secondary | ICD-10-CM | POA: Insufficient documentation

## 2017-05-10 DIAGNOSIS — R9439 Abnormal result of other cardiovascular function study: Secondary | ICD-10-CM | POA: Diagnosis present

## 2017-05-10 DIAGNOSIS — I350 Nonrheumatic aortic (valve) stenosis: Secondary | ICD-10-CM | POA: Insufficient documentation

## 2017-05-10 DIAGNOSIS — K219 Gastro-esophageal reflux disease without esophagitis: Secondary | ICD-10-CM | POA: Diagnosis not present

## 2017-05-10 DIAGNOSIS — Z9889 Other specified postprocedural states: Secondary | ICD-10-CM

## 2017-05-10 DIAGNOSIS — Z952 Presence of prosthetic heart valve: Secondary | ICD-10-CM | POA: Insufficient documentation

## 2017-05-10 DIAGNOSIS — Z7901 Long term (current) use of anticoagulants: Secondary | ICD-10-CM | POA: Diagnosis not present

## 2017-05-10 DIAGNOSIS — Z794 Long term (current) use of insulin: Secondary | ICD-10-CM | POA: Insufficient documentation

## 2017-05-10 DIAGNOSIS — I429 Cardiomyopathy, unspecified: Secondary | ICD-10-CM | POA: Diagnosis not present

## 2017-05-10 DIAGNOSIS — I4891 Unspecified atrial fibrillation: Secondary | ICD-10-CM | POA: Diagnosis not present

## 2017-05-10 HISTORY — DX: Abnormal result of other cardiovascular function study: R94.39

## 2017-05-10 HISTORY — PX: RIGHT/LEFT HEART CATH AND CORONARY/GRAFT ANGIOGRAPHY: CATH118267

## 2017-05-10 LAB — POCT I-STAT 3, ART BLOOD GAS (G3+)
Acid-base deficit: 1 mmol/L (ref 0.0–2.0)
Bicarbonate: 23.3 mmol/L (ref 20.0–28.0)
O2 Saturation: 97 %
TCO2: 24 mmol/L (ref 22–32)
pCO2 arterial: 37.2 mmHg (ref 32.0–48.0)
pH, Arterial: 7.405 (ref 7.350–7.450)
pO2, Arterial: 91 mmHg (ref 83.0–108.0)

## 2017-05-10 LAB — POCT I-STAT 3, VENOUS BLOOD GAS (G3P V)
Acid-base deficit: 1 mmol/L (ref 0.0–2.0)
Bicarbonate: 24.8 mmol/L (ref 20.0–28.0)
O2 Saturation: 59 %
TCO2: 26 mmol/L (ref 22–32)
pCO2, Ven: 43.3 mmHg — ABNORMAL LOW (ref 44.0–60.0)
pH, Ven: 7.366 (ref 7.250–7.430)
pO2, Ven: 32 mmHg (ref 32.0–45.0)

## 2017-05-10 LAB — PROTIME-INR
INR: 1.21
Prothrombin Time: 15.2 seconds (ref 11.4–15.2)

## 2017-05-10 LAB — GLUCOSE, CAPILLARY
Glucose-Capillary: 92 mg/dL (ref 65–99)
Glucose-Capillary: 82 mg/dL (ref 65–99)
Glucose-Capillary: 64 mg/dL — ABNORMAL LOW (ref 65–99)

## 2017-05-10 SURGERY — RIGHT/LEFT HEART CATH AND CORONARY/GRAFT ANGIOGRAPHY
Anesthesia: LOCAL

## 2017-05-10 MED ORDER — VERAPAMIL HCL 2.5 MG/ML IV SOLN
INTRAVENOUS | Status: DC | PRN
  Administered 2017-05-10: 10 mL via INTRA_ARTERIAL

## 2017-05-10 MED ORDER — MIDAZOLAM HCL 2 MG/2ML IJ SOLN
INTRAMUSCULAR | Status: AC
  Filled 2017-05-10: qty 2

## 2017-05-10 MED ORDER — SODIUM CHLORIDE 0.9% FLUSH: 3.0000 mL | INTRAVENOUS | Status: DC | PRN

## 2017-05-10 MED ORDER — SODIUM CHLORIDE 0.9 % IV SOLN: 250.0000 mL | INTRAVENOUS | Status: DC | PRN

## 2017-05-10 MED ORDER — ASPIRIN 81 MG PO CHEW: 81.0000 mg | CHEWABLE_TABLET | ORAL | Status: DC

## 2017-05-10 MED ORDER — HEPARIN SODIUM (PORCINE) 1000 UNIT/ML IJ SOLN
INTRAMUSCULAR | Status: AC
  Filled 2017-05-10: qty 1

## 2017-05-10 MED ORDER — VERAPAMIL HCL 2.5 MG/ML IV SOLN
INTRAVENOUS | Status: AC
  Filled 2017-05-10: qty 2

## 2017-05-10 MED ORDER — METFORMIN HCL 1000 MG PO TABS: 1000.0000 mg | ORAL_TABLET | Freq: Two times a day (BID) | ORAL | Status: DC

## 2017-05-10 MED ORDER — HEPARIN SODIUM (PORCINE) 1000 UNIT/ML IJ SOLN
INTRAMUSCULAR | Status: DC | PRN
  Administered 2017-05-10: 5000 [IU] via INTRAVENOUS

## 2017-05-10 MED ORDER — SODIUM CHLORIDE 0.9 % WEIGHT BASED INFUSION
3.0000 mL/kg/h | INTRAVENOUS | Status: AC
  Administered 2017-05-10: 3 mL/kg/h via INTRAVENOUS

## 2017-05-10 MED ORDER — SODIUM CHLORIDE 0.9% FLUSH: 3.0000 mL | Freq: Two times a day (BID) | INTRAVENOUS | Status: DC

## 2017-05-10 MED ORDER — HEPARIN (PORCINE) IN NACL 2-0.9 UNIT/ML-% IJ SOLN
INTRAMUSCULAR | Status: AC
  Filled 2017-05-10: qty 500

## 2017-05-10 MED ORDER — SODIUM CHLORIDE 0.9% FLUSH
3.0000 mL | INTRAVENOUS | Status: DC | PRN
Start: 2017-05-10 — End: 2017-05-10

## 2017-05-10 MED ORDER — IOPAMIDOL (ISOVUE-370) INJECTION 76%
INTRAVENOUS | Status: DC | PRN
  Administered 2017-05-10: 125 mL via INTRA_ARTERIAL

## 2017-05-10 MED ORDER — SODIUM CHLORIDE 0.9 % IV SOLN: INTRAVENOUS | Status: AC

## 2017-05-10 MED ORDER — FENTANYL CITRATE (PF) 100 MCG/2ML IJ SOLN
INTRAMUSCULAR | Status: DC | PRN
  Administered 2017-05-10 (×2): 25 ug via INTRAVENOUS

## 2017-05-10 MED ORDER — LIDOCAINE HCL (CARDIAC) 20 MG/ML IV SOLN
INTRAVENOUS | Status: AC
  Filled 2017-05-10: qty 5

## 2017-05-10 MED ORDER — FENTANYL CITRATE (PF) 100 MCG/2ML IJ SOLN
INTRAMUSCULAR | Status: AC
  Filled 2017-05-10: qty 2

## 2017-05-10 MED ORDER — HEPARIN (PORCINE) IN NACL 2-0.9 UNIT/ML-% IJ SOLN
INTRAMUSCULAR | Status: AC | PRN
  Administered 2017-05-10: 1000 mL

## 2017-05-10 MED ORDER — SODIUM CHLORIDE 0.9 % WEIGHT BASED INFUSION: 1.0000 mL/kg/h | INTRAVENOUS | Status: DC

## 2017-05-10 MED ORDER — MIDAZOLAM HCL 2 MG/2ML IJ SOLN
INTRAMUSCULAR | Status: DC | PRN
  Administered 2017-05-10 (×2): 1 mg via INTRAVENOUS

## 2017-05-10 MED ORDER — LIDOCAINE HCL (PF) 1 % IJ SOLN
INTRAMUSCULAR | Status: DC | PRN
  Administered 2017-05-10 (×2): 2 mL

## 2017-05-10 SURGICAL SUPPLY — 18 items
CATH 5FR JL3.5 JR4 ANG PIG MP (CATHETERS) ×1 IMPLANT
CATH BALLN WEDGE 5F 110CM (CATHETERS) ×1 IMPLANT
CATH INFINITI 5 FR JL3.5 (CATHETERS) ×1 IMPLANT
CATH INFINITI 5FR AL1 (CATHETERS) ×1 IMPLANT
CATH INFINITI 5FR JL5 (CATHETERS) ×1 IMPLANT
DEVICE RAD COMP TR BAND LRG (VASCULAR PRODUCTS) ×1 IMPLANT
GLIDESHEATH SLEND SS 6F .021 (SHEATH) ×1 IMPLANT
GUIDEWIRE INQWIRE 1.5J.035X260 (WIRE) IMPLANT
INQWIRE 1.5J .035X260CM (WIRE) ×2
KIT HEART LEFT (KITS) ×2 IMPLANT
PACK CARDIAC CATHETERIZATION (CUSTOM PROCEDURE TRAY) ×2 IMPLANT
SHEATH GLIDE SLENDER 4/5FR (SHEATH) ×1 IMPLANT
TRANSDUCER W/STOPCOCK (MISCELLANEOUS) ×3 IMPLANT
TUBING ART PRESS 72  MALE/FEM (TUBING) ×1
TUBING ART PRESS 72 MALE/FEM (TUBING) IMPLANT
TUBING CIL FLEX 10 FLL-RA (TUBING) ×2 IMPLANT
WIRE EMERALD ST .035X260CM (WIRE) ×1 IMPLANT
WIRE HI TORQ VERSACORE-J 145CM (WIRE) ×1 IMPLANT

## 2017-05-10 NOTE — H&P (View-Only) (Signed)
HPI The patient presents for follow of MVR and atrial fib.  After the last visit I ordered an echo which demonstrated a stable MVR and stable mild aortic valve disease.  However, the EF was moderately reduced.  His EF at the previous echo was 50 although it had been reduced in the more distant past.  It was now 35%.  I sent him for a Lexiscan Myoview.  He had a reduced EF with a new anterior wall perfusion defect that was not present on the previous perfusion study.  I brought him back to discuss this.  He does have more dyspnea than he used to have. He'll be short of breath doing moderate activity. As mentioned in previous note he was much more short of breath with mild hike recently.  He's not describing PND or orthopnea. He's not been having any chest discomfort other than a little sharp aching when he started spironolactone recently. He denies any palpitations, presyncope or syncope. He has some mild chronic lower extremity edema. He's actually lost a little weight.  Allergies  Allergen Reactions  . Doxycycline Hives    Current Outpatient Prescriptions  Medication Sig Dispense Refill  . amLODipine (NORVASC) 5 MG tablet Take 5 mg by mouth daily.    Marland Kitchen aspirin 81 MG chewable tablet Chew 81 mg by mouth daily.    Marland Kitchen esomeprazole (NEXIUM) 40 MG capsule Take 40 mg by mouth at bedtime.     . furosemide (LASIX) 40 MG tablet Take 40 mg by mouth 2 (two) times daily.     Marland Kitchen glipiZIDE (GLUCOTROL XL) 10 MG 24 hr tablet Take 10 mg by mouth daily.     . insulin glargine (LANTUS) 100 UNIT/ML injection Inject 85 Units into the skin 2 (two) times daily.     Marland Kitchen losartan (COZAAR) 100 MG tablet Take 50 mg by mouth daily.     . metFORMIN (GLUCOPHAGE) 1000 MG tablet Take 1,000 mg by mouth 2 (two) times daily.     . metoprolol (TOPROL-XL) 200 MG 24 hr tablet Take 200 mg by mouth daily after breakfast.     . Misc Natural Products (OSTEO BI-FLEX ADV TRIPLE ST) TABS Take 500 each by mouth daily as needed (joint  pain).     . nitroGLYCERIN (NITROSTAT) 0.4 MG SL tablet Place 0.4 mg under the tongue every 5 (five) minutes as needed for chest pain.    . pravastatin (PRAVACHOL) 80 MG tablet Take 80 mg by mouth daily.     . sitaGLIPtin (JANUVIA) 100 MG tablet Take 50 mg by mouth daily.     Marland Kitchen spironolactone (ALDACTONE) 25 MG tablet Take 1 tablet (25 mg total) by mouth daily. 90 tablet 3  . VIAGRA 100 MG tablet Take 100 mg by mouth as needed for erectile dysfunction.  3  . warfarin (COUMADIN) 5 MG tablet Take 5 mg by mouth daily.      No current facility-administered medications for this visit.     Past Medical History:  Diagnosis Date  . Anemia    "lost blood w/the cancer"  . Arthritis    back   . Atrial fibrillation ([place])    takes Coumadin daily  . Bruises easily    takes Coumadin daily  . CAD (coronary artery disease)    a. s/p CABG 11/2000 (L-LAD, S-RCA);  b. Lexiscan Myoview 11/12: EF 45%, ischemia and possibly some scar at the base of the inferolateral wall;   c. Lex MV 11/13:  EF  44%, Inf and IL scar/soft tissue atten, small mild amt of inf ischemia,  . Constipation    takes Colace daily  . Enlarged prostate   . Flu 09/2013  . GERD (gastroesophageal reflux disease)    takes Nexium daily  . Glaucoma   . History of blood transfusion    "related to cancer"  . History of colon polyps   . HLD (hyperlipidemia)    takes Vytorin daily  . HTN (hypertension)    takes Amlodipine,Metoprolol, and Ramipril daily  . Insomnia    states he has always been like this.But doesn't take any meds  . Kidney stones    "I passed them all"  . Nocturia   . Peripheral edema    takes Furosemide daily  . Peripheral neuropathy   . Rheumatic fever ~ 1965  . Rheumatic heart disease    a. s/p mechanical (St. Jude) MVR 2002;  b. Echo 5/11: Mild LVH, EF 45-50%, mild AS, mild AI, mean gradient 11 mmHg, MVR with normal gradients, moderate LAE, mild RAE;  c.  Echo 11/13:   mod LVH, EF 55-60%, mild AS (mean 18 mmHg),  mild AI, severe LAE, mild RAE, PASP 41  . 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 SOB (shortness of breath)    with exertion  . Type II diabetes mellitus ([place])    takes Januvia,Glipizide,and Metformin daily as well as Lantus    Past Surgical History:  Procedure Laterality Date  . APPLICATION OF WOUND VAC  2014  . CARDIAC CATHETERIZATION  2002  . COLONOSCOPY    . CORONARY ARTERY BYPASS GRAFT  2002   x 3  . ESOPHAGOGASTRODUODENOSCOPY    . HERNIA REPAIR    . INCISIONAL HERNIA REPAIR N/A 03/15/2014   Procedure: LAPAROSCOPIC INCISIONAL HERNIA;  Surgeon: Harl Bowie, MD;  Location: [facility];  Service: General;  Laterality: N/A;  . INSERTION OF MESH N/A 03/15/2014   Procedure: INSERTION OF MESH;  Surgeon: Harl Bowie, MD;  Location: [place];  Service: General;  Laterality: N/A;  . LAPAROSCOPIC INCISIONAL / UMBILICAL / [place]  03/15/2014   IHR  . LAPAROTOMY N/A 02/09/2013   Procedure: EXPLORATORY LAPAROTOMY WITH incisional biopsy intra- ABDOMINAL MASS ILOCECTOMY ;  Surgeon: Harl Bowie, MD;  Location: [facility];  Service: General;  Laterality: N/A;  . MITRAL VALVE REPLACEMENT  11/2000   #33 St. Jude mechanical mitral valve prosthesis. Dr. Servando Snare  . PORT-A-CATH REMOVAL  03/15/2014  . PORT-A-CATH REMOVAL Left 03/15/2014   Procedure: REMOVAL PORT-A-CATH;  Surgeon: Harl Bowie, MD;  Location: [facility];  Service: General;  Laterality: Left;  . PORTACATH PLACEMENT N/A 02/09/2013   Procedure: INSERTION PORT-A-CATH;  Surgeon: Harl Bowie, MD;  [facility];  Service: General;  Laterality: N/A;  . SEPTOPLASTY      ROS: As stated in the HPI and negative for all other systems.  PHYSICAL EXAM BP (!) 128/58   Pulse 68   Ht 6' (1.829 m)   Wt 226 lb (102.5 kg)   BMI 30.65 kg/m   GENERAL:  Well appearing NECK:  No jugular venous distention, waveform within normal limits, carotid upstroke brisk and symmetric, no bruits, no  thyromegaly LUNGS:  Clear to auscultation bilaterally CHEST:  Unremarkable HEART:  PMI not displaced or sustained, mechanic S1 and normal S2 within normal limits, no S3, no S4, no clicks, no rubs, 2 out of 6 apical systolic murmur, no  diastolic murmurs ABD:  Flat, positive bowel sounds normal in frequency in pitch, no bruits, no rebound, no guarding, no midline pulsatile mass, no hepatomegaly, no splenomegaly EXT:  2 plus pulses throughout, mild right leg edema, no cyanosis no clubbing NEURO:  nonfocal SKIN:  Venous stasis changes.   EKG:  NA    ASSESSMENT AND PLAN  CARDIOMYOPATHY -  The EF was moderately reduced on echo this spring.  He had a reduced EF with a new anterior wall perfusion defect that was not present on the previous perfusion study. He has dyspnea. I would suggest a right and left heart cath. He would need to have labs and bridging anticoagulation.  Cardiac cath is indicated.  He understands that this is higher risk with his renal insufficiency. I would recommend early in the morning for hydration and did Later in the morning. He would hold his diuretic the day before the procedure and his ARB. He would of course hold his metformin. We will coordinate his bridging Lovenox through our Coumadin clinic. The patient understands that risks included but are not limited to stroke (1 in 1000), death (1 in 5), kidney failure [usually temporary] (1 in 500), bleeding (1 in 200), allergic reaction [possibly serious] (1 in 200).  The patient understands and agrees to proceed.   CAD - This will be evaluated as above.   ATRIAL FIBRILLATION - I will bridge his warfarin as abobve  MECHANICAL MVR -  He had a stable mitral valve on echo in April. No change in therapy.  AORTIC VALVE STENOSIS:   He had mild AS and AI on the echo.  I will follow this clinically.

## 2017-05-10 NOTE — Interval H&P Note (Signed)
History and Physical Interval Note:  05/10/2017 10:23 AM  John [name]  has presented today for surgery, with the diagnosis of abnormal stress test  The various methods of treatment have been discussed with the patient and family. After consideration of risks, benefits and other options for treatment, the patient has consented to  Procedure(s): RIGHT/LEFT HEART CATH AND CORONARY/GRAFT ANGIOGRAPHY (N/A) as a surgical intervention .  The patient's history has been reviewed, patient examined, no change in status, stable for surgery.  I have reviewed the patient's chart and labs.  Questions were answered to the patient's satisfaction.   Cath Lab Visit (complete for each Cath Lab visit)  Clinical Evaluation Leading to the Procedure:   ACS: No.  Non-ACS:    Anginal Classification: CCS II  Anti-ischemic medical therapy: Maximal Therapy (2 or more classes of medications)  Non-Invasive Test Results: High-risk stress test findings: cardiac mortality >3%/year  Prior CABG: Previous CABG        John [name] [facility] 05/10/2017 10:23 AM

## 2017-05-10 NOTE — Discharge Instructions (Addendum)
Resume Lovenox and Coumadin as previously ordered by pharmacy  Hold metformin for 48 hours. Resume Monday  Radial Site Care Refer to this sheet in the next few weeks. These instructions provide you with information about caring for yourself after your procedure. Your health care provider may also give you more specific instructions. Your treatment has been planned according to current medical practices, but problems sometimes occur. Call your health care provider if you have any problems or questions after your procedure. What can I expect after the procedure? After your procedure, it is typical to have the following:  Bruising at the radial site that usually fades within 1-2 weeks.  Blood collecting in the tissue (hematoma) that may be painful to the touch. It should usually decrease in size and tenderness within 1-2 weeks.  Follow these instructions at home:  Take medicines only as directed by your health care provider.  You may shower 24-48 hours after the procedure or as directed by your health care provider. Remove the bandage (dressing) and gently wash the site with plain soap and water. Pat the area dry with a clean towel. Do not rub the site, because this may cause bleeding.  Do not take baths, swim, or use a hot tub until your health care provider approves.  Check your insertion site every day for redness, swelling, or drainage.  Do not apply powder or lotion to the site.  Do not flex or bend the affected arm for 24 hours or as directed by your health care provider.  Do not push or pull heavy objects with the affected arm for 24 hours or as directed by your health care provider.  Do not lift over 10 lb (4.5 kg) for 5 days after your procedure or as directed by your health care provider.  Ask your health care provider when it is okay to: ? Return to work or school. ? Resume usual physical activities or sports. ? Resume sexual activity.  Do not drive home if you are  discharged the same day as the procedure. Have someone else drive you.  You may drive 24 hours after the procedure unless otherwise instructed by your health care provider.  Do not operate machinery or power tools for 24 hours after the procedure.  If your procedure was done as an outpatient procedure, which means that you went home the same day as your procedure, a responsible adult should be with you for the first 24 hours after you arrive home.  Keep all follow-up visits as directed by your health care provider. This is important. Contact a health care provider if:  You have a fever.  You have chills.  You have increased bleeding from the radial site. Hold pressure on the site. Get help right away if:  You have unusual pain at the radial site.  You have redness, warmth, or swelling at the radial site.  You have drainage (other than a small amount of blood on the dressing) from the radial site.  The radial site is bleeding, and the bleeding does not stop after 30 minutes of holding steady pressure on the site.  Your arm or hand becomes pale, cool, tingly, or numb. This information is not intended to replace advice given to you by your health care provider. Make sure you discuss any questions you have with your health care provider. Document Released: 09/29/2010 Document Revised: 02/02/2016 Document Reviewed: 03/15/2014 Elsevier Interactive Patient Education  2018 [employer].

## 2017-05-14 ENCOUNTER — Telehealth: Payer: Self-pay | Admitting: Cardiology

## 2017-05-14 NOTE — Telephone Encounter (Signed)
Follow up      Returning a call to the nurse.  Wife says pt is at home but keeps missing the call.

## 2017-05-14 NOTE — Telephone Encounter (Signed)
John [name] is returning a call . Thanks

## 2017-05-14 NOTE — Telephone Encounter (Signed)
Returned call to patient no answer.LMTC. 

## 2017-05-14 NOTE — Telephone Encounter (Signed)
Returned call, phone rings w no answer/VM pickup.

## 2017-05-14 NOTE — Telephone Encounter (Signed)
New Message ° ° pt verbalized that he is returning call for rn  °

## 2017-05-14 NOTE — Telephone Encounter (Signed)
Returned call. Pt had cath last week and was told John [name] would be contacting him about a sooner f/u w Dr. Percival Spanish (currently scheduled for 06/12/17 but wife thought he needed to be seen sooner)  Please advise if pt can be accomodated for sooner OV or if other recommendations following procedure.

## 2017-05-15 ENCOUNTER — Ambulatory Visit (INDEPENDENT_AMBULATORY_CARE_PROVIDER_SITE_OTHER): Payer: Medicare HMO

## 2017-05-15 DIAGNOSIS — Z9889 Other specified postprocedural states: Secondary | ICD-10-CM | POA: Diagnosis not present

## 2017-05-15 DIAGNOSIS — Z5181 Encounter for therapeutic drug level monitoring: Secondary | ICD-10-CM | POA: Diagnosis not present

## 2017-05-15 DIAGNOSIS — I482 Chronic atrial fibrillation, unspecified: Secondary | ICD-10-CM

## 2017-05-15 LAB — POCT INR: INR: 1.5

## 2017-05-15 NOTE — Telephone Encounter (Signed)
When do you need to see this patient in follow-up s/p cath?  9/19 in [place] is full (right now).  Do you want/need to see him in the [place] office?  Right now pt has an appt for 10/3 in [place].

## 2017-05-15 NOTE — Telephone Encounter (Signed)
Follow Up   Patient wife calling to follow up. She said they never received call yesterday. Unsure why anyone would be calling.

## 2017-05-15 NOTE — Telephone Encounter (Signed)
Good morning Pam, do you have any sooner appt for this pt in [place]

## 2017-05-17 NOTE — Telephone Encounter (Signed)
Left message on home voicemail - ok per Dr Percival Spanish to be seen 10/3 as scheduled.  Requested he call back if questions or concerns.

## 2017-05-17 NOTE — Telephone Encounter (Signed)
10/3 is OK

## 2017-05-20 ENCOUNTER — Ambulatory Visit (INDEPENDENT_AMBULATORY_CARE_PROVIDER_SITE_OTHER): Payer: Medicare HMO | Admitting: *Deleted

## 2017-05-20 DIAGNOSIS — Z5181 Encounter for therapeutic drug level monitoring: Secondary | ICD-10-CM | POA: Diagnosis not present

## 2017-05-20 DIAGNOSIS — I482 Chronic atrial fibrillation, unspecified: Secondary | ICD-10-CM

## 2017-05-20 DIAGNOSIS — Z9889 Other specified postprocedural states: Secondary | ICD-10-CM | POA: Diagnosis not present

## 2017-05-20 LAB — POCT INR: INR: 3.8

## 2017-05-23 ENCOUNTER — Telehealth: Payer: Self-pay | Admitting: Cardiology

## 2017-05-23 NOTE — Telephone Encounter (Signed)
Closed Encounter  °

## 2017-05-23 NOTE — Progress Notes (Signed)
HPI The patient presents for follow of MVR and atrial fib.  After the last visit I ordered an echo which demonstrated a stable MVR and stable mild aortic valve disease.  However, the EF was moderately reduced.  His EF at the previous echo was 50 although it had been reduced in the more distant past.  It was now 35%.  I sent him for a Lexiscan Myoview.  He had a reduced EF with a new anterior wall perfusion defect that was not present on the previous perfusion study.  He underwent cardiac cath with results described below.  He had an occludes SVG to the RCA but flow through the native vessel.  The LAD was occluded but with good flow through the LIMA.  He was managed medically and returns for follow up.   He reports that he is eating better and reduced his salt.  The patient denies any new symptoms such as chest discomfort, neck or arm discomfort. There has been no new shortness of breath, PND or orthopnea. There have been no reported palpitations, presyncope or syncope.  Allergies  Allergen Reactions  . Doxycycline Hives    Current Outpatient Prescriptions  Medication Sig Dispense Refill  . aspirin 81 MG chewable tablet Chew 81 mg by mouth daily.    Marland Kitchen esomeprazole (NEXIUM) 40 MG capsule Take 40 mg by mouth at bedtime.     . furosemide (LASIX) 40 MG tablet Take 40 mg by mouth daily.     Marland Kitchen glipiZIDE (GLUCOTROL XL) 10 MG 24 hr tablet Take 10 mg by mouth daily.     . insulin glargine (LANTUS) 100 UNIT/ML injection Inject 85 Units into the skin 2 (two) times daily as needed (takes 85 units every night at bedtime 2 nd dose only if needed for elevated blood sugars).     . metFORMIN (GLUCOPHAGE) 1000 MG tablet Take 1 tablet (1,000 mg total) by mouth 2 (two) times daily.    . metoprolol (TOPROL-XL) 200 MG 24 hr tablet Take 200 mg by mouth daily after breakfast.     . Misc Natural Products (OSTEO BI-FLEX ADV TRIPLE ST) TABS Take 500 each by mouth daily.     . nitroGLYCERIN (NITROSTAT) 0.4 MG SL tablet  Place 0.4 mg under the tongue every 5 (five) minutes as needed for chest pain.    . pravastatin (PRAVACHOL) 80 MG tablet Take 80 mg by mouth every evening.     . sitaGLIPtin (JANUVIA) 50 MG tablet Take 50 mg by mouth daily.    Marland Kitchen spironolactone (ALDACTONE) 25 MG tablet Take 1 tablet (25 mg total) by mouth daily. 90 tablet 3  . VIAGRA 100 MG tablet Take 100 mg by mouth as needed for erectile dysfunction.  3  . warfarin (COUMADIN) 5 MG tablet Take 5 mg by mouth daily.     . sacubitril-valsartan (ENTRESTO) 49-51 MG Take 1 tablet by mouth 2 (two) times daily. 60 tablet 11   No current facility-administered medications for this visit.     Past Medical History:  Diagnosis Date  . Anemia    "lost blood w/the cancer"  . Arthritis    back   . Atrial fibrillation ([place])    takes Coumadin daily  . Bruises easily    takes Coumadin daily  . CAD (coronary artery disease)    a. s/p CABG 11/2000 (L-LAD, S-RCA);  b. Lexiscan Myoview 11/12: EF 45%, ischemia and possibly some scar at the base of the inferolateral wall;  c. Lex MV 11/13:  EF 44%, Inf and IL scar/soft tissue atten, small mild amt of inf ischemia,  . Constipation    takes Colace daily  . Enlarged prostate   . Flu 09/2013  . GERD (gastroesophageal reflux disease)    takes Nexium daily  . Glaucoma   . History of blood transfusion    "related to cancer"  . History of colon polyps   . HLD (hyperlipidemia)    takes Vytorin daily  . HTN (hypertension)    takes Amlodipine,Metoprolol, and Ramipril daily  . Insomnia    states he has always been like this.But doesn't take any meds  . Kidney stones    "I passed them all"  . Nocturia   . Peripheral edema    takes Furosemide daily  . Peripheral neuropathy   . Rheumatic fever ~ 1965  . Rheumatic heart disease    a. s/p mechanical (St. Jude) MVR 2002;  b. Echo 5/11: Mild LVH, EF 45-50%, mild AS, mild AI, mean gradient 11 mmHg, MVR with normal gradients, moderate LAE, mild RAE;  c.  Echo  11/13:   mod LVH, EF 55-60%, mild AS (mean 18 mmHg), mild AI, severe LAE, mild RAE, PASP 41  . 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 SOB (shortness of breath)    with exertion  . Type II diabetes mellitus ([place])    takes Januvia,Glipizide,and Metformin daily as well as Lantus    Past Surgical History:  Procedure Laterality Date  . APPLICATION OF WOUND VAC  2014  . CARDIAC CATHETERIZATION  2002  . COLONOSCOPY    . CORONARY ARTERY BYPASS GRAFT  2002   x 3  . ESOPHAGOGASTRODUODENOSCOPY    . HERNIA REPAIR    . INCISIONAL HERNIA REPAIR N/A 03/15/2014   Procedure: LAPAROSCOPIC INCISIONAL HERNIA;  Surgeon: Harl Bowie, MD;  Location: [place];  Service: General;  Laterality: N/A;  . INSERTION OF MESH N/A 03/15/2014   Procedure: INSERTION OF MESH;  Surgeon: Harl Bowie, MD;  Location: [place];  Service: General;  Laterality: N/A;  . LAPAROSCOPIC INCISIONAL / UMBILICAL / [place]  03/15/2014   IHR  . LAPAROTOMY N/A 02/09/2013   Procedure: EXPLORATORY LAPAROTOMY WITH incisional biopsy intra- ABDOMINAL MASS ILOCECTOMY ;  Surgeon: Harl Bowie, MD;  [facility];  Service: General;  Laterality: N/A;  . MITRAL VALVE REPLACEMENT  11/2000   #33 St. Jude mechanical mitral valve prosthesis. Dr. Servando Snare  . PORT-A-CATH REMOVAL  03/15/2014  . PORT-A-CATH REMOVAL Left 03/15/2014   Procedure: REMOVAL PORT-A-CATH;  Surgeon: Harl Bowie, MD;  Location: [place];  Service: General;  Laterality: Left;  . PORTACATH PLACEMENT N/A 02/09/2013   Procedure: INSERTION PORT-A-CATH;  Surgeon: Harl Bowie, MD;  [facility];  Service: General;  Laterality: N/A;  . RIGHT/LEFT HEART CATH AND CORONARY/GRAFT ANGIOGRAPHY N/A 05/10/2017   Procedure: RIGHT/LEFT HEART CATH AND CORONARY/GRAFT ANGIOGRAPHY;  Surgeon: Martinique, Peter M, MD;  Location: [place] [facility];  Service: Cardiovascular;  Laterality: N/A;  . SEPTOPLASTY      ROS: As stated in the  HPI and negative for all other systems.  PHYSICAL EXAM BP 124/68   Pulse 68   Ht 6' (1.829 m)   Wt 219 lb (99.3 kg)   BMI 29.70 kg/m   GENERAL:  Well appearing NECK:  No jugular venous distention, waveform within normal limits, carotid upstroke brisk and symmetric, no bruits,  no thyromegaly LUNGS:  Clear to auscultation bilaterally CHEST:  Unremarkable HEART:  PMI not displaced or sustained, mechanical S1 and S2 within normal limits, no S3,  no clicks, no rubs, 2/6 apical systolic murmur with no diastolic murmurs, irregular ABD:  Flat, positive bowel sounds normal in frequency in pitch, no bruits, no rebound, no guarding, no midline pulsatile mass, no hepatomegaly, no splenomegaly EXT:  2 plus pulses throughout, no edema, no cyanosis no clubbing SKIN:  Bruising from the Lovenox injections, venous stasis changes.     CATH:    Conclusion     Mid RCA to Dist RCA lesion, 75 %stenosed.  Prox LAD lesion, 70 %stenosed.  Mid LAD lesion, 100 %stenosed.  2nd Mrg lesion, 30 %stenosed.  Ost Cx lesion, 60 %stenosed.  SVG.  Origin lesion, 100 %stenosed.  LIMA graft was visualized by angiography and is large and anatomically normal.  LV end diastolic pressure is moderately elevated.  Hemodynamic findings consistent with severe pulmonary hypertension.   1. Severe 2 vessel obstructive CAD.     - 100% LAD after the first diagonal    - Long 75% mid to distal RCA 2. Patent LIMA to the LAD 3. Occluded SVG to the RCA 4. Moderate to severe pulmonary HTN 5. Elevated LV filling pressures without significant MV gradient.      ASSESSMENT AND PLAN  CARDIOMYOPATHY -  The etiology of this is not entirely clear. We'll going to manage it medically. I'm going to have him stop his Norvasc and Cozaar. He will reduce his Lasix to 20 mg daily. I'm going to start and titrate in the future his Entresto.    CAD - He has disease as above.  No change in therapy.    ATRIAL FIBRILLATION  - Continue with warfarin.    MECHANICAL MVR -  He had stable function in April.  No change in therapy.   AORTIC VALVE STENOSIS:   This is mild AS and AI and I will follow this clinically.

## 2017-05-24 ENCOUNTER — Ambulatory Visit (INDEPENDENT_AMBULATORY_CARE_PROVIDER_SITE_OTHER): Payer: Medicare HMO | Admitting: Cardiology

## 2017-05-24 ENCOUNTER — Encounter: Payer: Self-pay | Admitting: Cardiology

## 2017-05-24 VITALS — BP 124/68 | HR 68 | Ht 72.0 in | Wt 219.0 lb

## 2017-05-24 DIAGNOSIS — I255 Ischemic cardiomyopathy: Secondary | ICD-10-CM | POA: Diagnosis not present

## 2017-05-24 DIAGNOSIS — I482 Chronic atrial fibrillation, unspecified: Secondary | ICD-10-CM

## 2017-05-24 DIAGNOSIS — I2510 Atherosclerotic heart disease of native coronary artery without angina pectoris: Secondary | ICD-10-CM

## 2017-05-24 DIAGNOSIS — I10 Essential (primary) hypertension: Secondary | ICD-10-CM | POA: Diagnosis not present

## 2017-05-24 DIAGNOSIS — Z952 Presence of prosthetic heart valve: Secondary | ICD-10-CM | POA: Diagnosis not present

## 2017-05-24 MED ORDER — SACUBITRIL-VALSARTAN 49-51 MG PO TABS: 1.0000 | ORAL_TABLET | Freq: Two times a day (BID) | ORAL | 11 refills | Status: DC

## 2017-05-24 NOTE — Patient Instructions (Addendum)
Medication Instructions:  STOP- Amlodipine and Losartan START- Entresto 49/51 mg take 1 tablets twice a day START 36 hours after stopping Losartan  If you need a refill on your cardiac medications before your next appointment, please call your pharmacy.  Labwork: None Ordered   Testing/Procedures: None Ordered  Follow-Up: Your physician wants you to follow-up in: October 3rd at 9:40 am    Thank you for choosing [facility] at [employer]!!

## 2017-05-26 ENCOUNTER — Encounter: Payer: Self-pay | Admitting: Cardiology

## 2017-05-26 DIAGNOSIS — Z952 Presence of prosthetic heart valve: Secondary | ICD-10-CM | POA: Insufficient documentation

## 2017-05-28 ENCOUNTER — Telehealth: Payer: Self-pay | Admitting: Cardiology

## 2017-05-28 NOTE — Telephone Encounter (Signed)
New message    [facility] is calling. They have some questions, they are trying to help pt get his Entresto. Please call.

## 2017-05-28 NOTE — Telephone Encounter (Signed)
Returned call to [place] with [facility] Dept. She needs MD license # to help patient get Rx for entresto. Info provided.

## 2017-06-03 ENCOUNTER — Ambulatory Visit (INDEPENDENT_AMBULATORY_CARE_PROVIDER_SITE_OTHER): Payer: Medicare HMO | Admitting: *Deleted

## 2017-06-03 DIAGNOSIS — Z5181 Encounter for therapeutic drug level monitoring: Secondary | ICD-10-CM

## 2017-06-03 DIAGNOSIS — Z9889 Other specified postprocedural states: Secondary | ICD-10-CM | POA: Diagnosis not present

## 2017-06-03 DIAGNOSIS — I482 Chronic atrial fibrillation, unspecified: Secondary | ICD-10-CM

## 2017-06-03 LAB — POCT INR: INR: 2.7

## 2017-06-11 NOTE — Progress Notes (Signed)
HPI The patient presents for follow of MVR and atrial fib.  After a recent visit I ordered an echo which demonstrated a stable MVR and stable mild aortic valve disease.  However, the EF was moderately reduced.  His EF at the previous echo was 50 although it had been reduced in the more distant past.  It was now 35%.  I sent him for a Lexiscan Myoview.  He had a reduced EF with a new anterior wall perfusion defect that was not present on the previous perfusion study.  He underwent cardiac cath.  He had an occludes SVG to the RCA but flow through the native vessel.  The LAD was occluded but with good flow through the LIMA.  He returned for follow up and I started Entresto.  He has done relatively well with this although he does have some leg weakness.  The patient denies any new symptoms such as chest discomfort, neck or arm discomfort. There has been no new shortness of breath, PND or orthopnea. There have been no reported palpitations, presyncope or syncope.   Allergies  Allergen Reactions  . Doxycycline Hives    Current Outpatient Prescriptions  Medication Sig Dispense Refill  . aspirin 81 MG chewable tablet Chew 81 mg by mouth daily.    Marland Kitchen dexlansoprazole (DEXILANT) 60 MG capsule Take 60 mg by mouth daily.    . furosemide (LASIX) 40 MG tablet Take 40 mg by mouth daily.     Marland Kitchen glipiZIDE (GLUCOTROL XL) 10 MG 24 hr tablet Take 10 mg by mouth daily.     . insulin glargine (LANTUS) 100 UNIT/ML injection Inject 85 Units into the skin 2 (two) times daily as needed (takes 85 units every night at bedtime 2 nd dose only if needed for elevated blood sugars).     . metFORMIN (GLUCOPHAGE) 1000 MG tablet Take 1 tablet (1,000 mg total) by mouth 2 (two) times daily.    . metoprolol (TOPROL-XL) 200 MG 24 hr tablet Take 200 mg by mouth daily after breakfast.     . Misc Natural Products (OSTEO BI-FLEX ADV TRIPLE ST) TABS Take 500 each by mouth daily.     . nitroGLYCERIN (NITROSTAT) 0.4 MG SL tablet Place 0.4  mg under the tongue every 5 (five) minutes as needed for chest pain.    . pravastatin (PRAVACHOL) 80 MG tablet Take 80 mg by mouth every evening.     . sacubitril-valsartan (ENTRESTO) 49-51 MG Take 1 tablet by mouth 2 (two) times daily. 60 tablet 11  . sitaGLIPtin (JANUVIA) 50 MG tablet Take 50 mg by mouth daily.    Marland Kitchen spironolactone (ALDACTONE) 25 MG tablet Take 1 tablet (25 mg total) by mouth daily. 90 tablet 3  . VIAGRA 100 MG tablet Take 100 mg by mouth as needed for erectile dysfunction.  3  . warfarin (COUMADIN) 5 MG tablet Take 5 mg by mouth daily.      No current facility-administered medications for this visit.     Past Medical History:  Diagnosis Date  . Anemia    "lost blood w/the cancer"  . Arthritis    back   . Atrial fibrillation ([place])    takes Coumadin daily  . Bruises easily    takes Coumadin daily  . CAD (coronary artery disease)    a. s/p CABG 11/2000 (L-LAD, S-RCA);  b. Lexiscan Myoview 11/12: EF 45%, ischemia and possibly some scar at the base of the inferolateral wall;   c. Lex MV  11/13:  EF 44%, Inf and IL scar/soft tissue atten, small mild amt of inf ischemia,  . Constipation    takes Colace daily  . Enlarged prostate   . Flu 09/2013  . GERD (gastroesophageal reflux disease)    takes Nexium daily  . Glaucoma   . History of blood transfusion    "related to cancer"  . History of colon polyps   . HLD (hyperlipidemia)    takes Vytorin daily  . HTN (hypertension)    takes Amlodipine,Metoprolol, and Ramipril daily  . Insomnia    states he has always been like this.But doesn't take any meds  . Kidney stones    "I passed them all"  . Nocturia   . Peripheral edema    takes Furosemide daily  . Peripheral neuropathy   . Rheumatic fever ~ 1965  . Rheumatic heart disease    a. s/p mechanical (St. Jude) MVR 2002;  b. Echo 5/11: Mild LVH, EF 45-50%, mild AS, mild AI, mean gradient 11 mmHg, MVR with normal gradients, moderate LAE, mild RAE;  c.  Echo 11/13:   mod  LVH, EF 55-60%, mild AS (mean 18 mmHg), mild AI, severe LAE, mild RAE, PASP 41  . 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 SOB (shortness of breath)    with exertion  . Type II diabetes mellitus ([place])    takes Januvia,Glipizide,and Metformin daily as well as Lantus    Past Surgical History:  Procedure Laterality Date  . APPLICATION OF WOUND VAC  2014  . CARDIAC CATHETERIZATION  2002  . COLONOSCOPY    . CORONARY ARTERY BYPASS GRAFT  2002   x 3  . ESOPHAGOGASTRODUODENOSCOPY    . HERNIA REPAIR    . INCISIONAL HERNIA REPAIR N/A 03/15/2014   Procedure: LAPAROSCOPIC INCISIONAL HERNIA;  Surgeon: Harl Bowie, MD;  Location: [facility];  Service: General;  Laterality: N/A;  . INSERTION OF MESH N/A 03/15/2014   Procedure: INSERTION OF MESH;  Surgeon: Harl Bowie, MD;  Location: [facility];  Service: General;  Laterality: N/A;  . LAPAROSCOPIC INCISIONAL / UMBILICAL / [place]  03/15/2014   IHR  . LAPAROTOMY N/A 02/09/2013   Procedure: EXPLORATORY LAPAROTOMY WITH incisional biopsy intra- ABDOMINAL MASS ILOCECTOMY ;  Surgeon: Harl Bowie, MD;  [facility];  Service: General;  Laterality: N/A;  . MITRAL VALVE REPLACEMENT  11/2000   #33 St. Jude mechanical mitral valve prosthesis. Dr. Servando Snare  . PORT-A-CATH REMOVAL  03/15/2014  . PORT-A-CATH REMOVAL Left 03/15/2014   Procedure: REMOVAL PORT-A-CATH;  Surgeon: Harl Bowie, MD;  Location: [facility];  Service: General;  Laterality: Left;  . PORTACATH PLACEMENT N/A 02/09/2013   Procedure: INSERTION PORT-A-CATH;  Surgeon: Harl Bowie, MD;  [facility];  Service: General;  Laterality: N/A;  . RIGHT/LEFT HEART CATH AND CORONARY/GRAFT ANGIOGRAPHY N/A 05/10/2017   Procedure: RIGHT/LEFT HEART CATH AND CORONARY/GRAFT ANGIOGRAPHY;  Surgeon: Martinique, Peter M, MD;  Location: [facility];  Service: Cardiovascular;  Laterality: N/A;  . SEPTOPLASTY      ROS: Diarrhea.  Otherwise as stated  in the HPI and negative for all other systems.  PHYSICAL EXAM BP 116/60   Pulse 84   Ht 6' (1.829 m)   Wt 219 lb (99.3 kg)   BMI 29.70 kg/m   GENERAL:  Well appearing NECK:  No jugular venous distention, waveform within normal limits, carotid upstroke brisk and symmetric, no bruits,  no thyromegaly LUNGS:  Clear to auscultation bilaterally CHEST:  Unremarkable HEART:  PMI not displaced or sustained,S1 and S2 within normal limits, no S3, no clicks, no rubs, 2 out of 6 apical systolic murmur radiating out the aortic outflow tract, no diastolic murmurs, irregular ABD:  Flat, positive bowel sounds normal in frequency in pitch, no bruits, no rebound, no guarding, no midline pulsatile mass, no hepatomegaly, no splenomegaly EXT:  2 plus pulses throughout, trace edema, no cyanosis no clubbing   ASSESSMENT AND PLAN  CARDIOMYOPATHY -  The etiology of this is not entirely clear.  At the last visit I reduced his Lasix and started Entresto.  He returns for follow up.  At this point with his leg weakness and mild low blood pressure and I'm not going to titrate his medications. He will remain on the meds as listed.    CAD - The patient has no new sypmtoms.  No further cardiovascular testing is indicated.  We will continue with aggressive risk reduction and meds as listed.  ATRIAL FIBRILLATION - Continue with warfarin.   No change in therapy.  The patient  tolerates this rhythm and rate control and anticoagulation. We will continue with the meds as listed.  MECHANICAL MVR -  He had stable function in April.  No further imaging is indcated.   AORTIC VALVE STENOSIS:   This is mild AS and AI. I will follow clinically.

## 2017-06-12 ENCOUNTER — Ambulatory Visit (INDEPENDENT_AMBULATORY_CARE_PROVIDER_SITE_OTHER): Payer: Medicare HMO | Admitting: Cardiology

## 2017-06-12 ENCOUNTER — Ambulatory Visit: Payer: Medicare HMO | Admitting: Cardiology

## 2017-06-12 ENCOUNTER — Encounter: Payer: Self-pay | Admitting: Cardiology

## 2017-06-12 VITALS — BP 116/60 | HR 84 | Ht 72.0 in | Wt 219.0 lb

## 2017-06-12 DIAGNOSIS — I482 Chronic atrial fibrillation, unspecified: Secondary | ICD-10-CM

## 2017-06-12 DIAGNOSIS — Z952 Presence of prosthetic heart valve: Secondary | ICD-10-CM

## 2017-06-12 DIAGNOSIS — I2510 Atherosclerotic heart disease of native coronary artery without angina pectoris: Secondary | ICD-10-CM | POA: Diagnosis not present

## 2017-06-12 DIAGNOSIS — I255 Ischemic cardiomyopathy: Secondary | ICD-10-CM | POA: Diagnosis not present

## 2017-06-12 MED ORDER — SACUBITRIL-VALSARTAN 49-51 MG PO TABS: 1.0000 | ORAL_TABLET | Freq: Two times a day (BID) | ORAL | 11 refills | Status: DC

## 2017-06-12 NOTE — Patient Instructions (Signed)
Medication Instructions:  The current medical regimen is effective;  continue present plan and medications.  Labwork: Please have blood work at Dr [employer] office ([facility])  Follow-Up: Follow up in [place] with Dr Percival Spanish in approximately 2 months.  If you need a refill on your cardiac medications before your next appointment, please call your pharmacy.  Thank you for choosing [facility]!!

## 2017-07-09 ENCOUNTER — Telehealth: Payer: Self-pay | Admitting: Oncology

## 2017-07-09 ENCOUNTER — Ambulatory Visit (HOSPITAL_BASED_OUTPATIENT_CLINIC_OR_DEPARTMENT_OTHER): Payer: Medicare HMO | Admitting: Oncology

## 2017-07-09 ENCOUNTER — Other Ambulatory Visit (HOSPITAL_BASED_OUTPATIENT_CLINIC_OR_DEPARTMENT_OTHER): Payer: Medicare HMO

## 2017-07-09 VITALS — BP 134/73 | HR 89 | Temp 98.3°F | Resp 18 | Ht 72.0 in | Wt 225.1 lb

## 2017-07-09 DIAGNOSIS — I10 Essential (primary) hypertension: Secondary | ICD-10-CM

## 2017-07-09 DIAGNOSIS — C8590 Non-Hodgkin lymphoma, unspecified, unspecified site: Secondary | ICD-10-CM

## 2017-07-09 DIAGNOSIS — E119 Type 2 diabetes mellitus without complications: Secondary | ICD-10-CM

## 2017-07-09 DIAGNOSIS — Z8571 Personal history of Hodgkin lymphoma: Secondary | ICD-10-CM

## 2017-07-09 DIAGNOSIS — C8299 Follicular lymphoma, unspecified, extranodal and solid organ sites: Secondary | ICD-10-CM

## 2017-07-09 DIAGNOSIS — Z8572 Personal history of non-Hodgkin lymphomas: Secondary | ICD-10-CM | POA: Diagnosis not present

## 2017-07-09 DIAGNOSIS — N189 Chronic kidney disease, unspecified: Secondary | ICD-10-CM | POA: Diagnosis not present

## 2017-07-09 LAB — COMPREHENSIVE METABOLIC PANEL
ALT: 19 U/L (ref 0–55)
AST: 22 U/L (ref 5–34)
Albumin: 3.4 g/dL — ABNORMAL LOW (ref 3.5–5.0)
Alkaline Phosphatase: 104 U/L (ref 40–150)
Anion Gap: 10 mEq/L (ref 3–11)
BUN: 44.9 mg/dL — ABNORMAL HIGH (ref 7.0–26.0)
CO2: 21 mEq/L — ABNORMAL LOW (ref 22–29)
Calcium: 9.1 mg/dL (ref 8.4–10.4)
Chloride: 110 mEq/L — ABNORMAL HIGH (ref 98–109)
Creatinine: 1.4 mg/dL — ABNORMAL HIGH (ref 0.7–1.3)
EGFR: 54 mL/min/{1.73_m2} — ABNORMAL LOW (ref >60)
Glucose: 104 mg/dl (ref 70–140)
Potassium: 4.9 mEq/L (ref 3.5–5.1)
Sodium: 141 mEq/L (ref 136–145)
Total Bilirubin: 0.5 mg/dL (ref 0.20–1.20)
Total Protein: 6.5 g/dL (ref 6.4–8.3)

## 2017-07-09 LAB — CBC WITH DIFFERENTIAL/PLATELET
BASO%: 1.3 % (ref 0.0–2.0)
Basophils Absolute: 0.1 10*3/uL (ref 0.0–0.1)
EOS%: 4.1 % (ref 0.0–7.0)
Eosinophils Absolute: 0.3 10*3/uL (ref 0.0–0.5)
HCT: 39.8 % (ref 38.4–49.9)
HGB: 13.1 g/dL (ref 13.0–17.1)
LYMPH%: 11.8 % — ABNORMAL LOW (ref 14.0–49.0)
MCH: 30.2 pg (ref 27.2–33.4)
MCHC: 33 g/dL (ref 32.0–36.0)
MCV: 91.4 fL (ref 79.3–98.0)
MONO#: 0.7 10*3/uL (ref 0.1–0.9)
MONO%: 9.8 % (ref 0.0–14.0)
NEUT#: 5 10*3/uL (ref 1.5–6.5)
NEUT%: 73 % (ref 39.0–75.0)
Platelets: 187 10*3/uL (ref 140–400)
RBC: 4.36 10*6/uL (ref 4.20–5.82)
RDW: 17.5 % — ABNORMAL HIGH (ref 11.0–14.6)
WBC: 6.8 10*3/uL (ref 4.0–10.3)
lymph#: 0.8 10*3/uL — ABNORMAL LOW (ref 0.9–3.3)

## 2017-07-09 NOTE — Progress Notes (Signed)
Hematology and Oncology Follow Up Visit  John [name] [id number] [birth year] 66 y.o. 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   Prior Therapy: He is status post exploratory laparotomy and excision of an intra-abdominal mass and ileocolectomy and a Port-A-Cath placement on 02/09/2013 Chemotherapy with CHOP-R cycle one given on 04/16/2013. He is status post 4 cycles of chemotherapy completed in 06/2013.  Current therapy:  Observation and surveillance.  Interim History:  John [name] presents today for a followup visit. Since the last visit, he reports feeling well without any complaints.  He remains active and attends activities of daily living.  He has been trying to eat healthy and his blood sugar has been under good control. He died abdominal pain or discomfort. He reports no early satiety or change in his bowel habits. He denies fevers, chills, sweats or other constitutional symptoms.  He continues to follow with cardiology regarding his cardiac issues although no recent exacerbations noted.  He does not report any headaches, blurry vision, syncope or seizures. He denies any problems with nausea or vomiting. He did not report any lymphadenopathy or easy bruisability. Has not reported any petechiae or bleeding. He does not report any chest pain, palpitation orthopnea. Does not report any cough or hemoptysis. He does not report any frequency urgency or hesitancy. Rest of his review of systems unremarkable.  Medications: I have reviewed the patient's current medications.  Current Outpatient Prescriptions  Medication Sig Dispense Refill  . aspirin 81 MG chewable tablet Chew 81 mg by mouth daily.    Marland Kitchen dexlansoprazole (DEXILANT) 60 MG capsule Take 60 mg by mouth daily.    . furosemide (LASIX)  40 MG tablet Take 40 mg by mouth daily.     Marland Kitchen glipiZIDE (GLUCOTROL XL) 10 MG 24 hr tablet Take 10 mg by mouth daily.     . insulin glargine (LANTUS) 100 UNIT/ML injection Inject 85 Units into the skin 2 (two) times daily as needed (takes 85 units every night at bedtime 2 nd dose only if needed for elevated blood sugars).     . metFORMIN (GLUCOPHAGE) 1000 MG tablet Take 1 tablet (1,000 mg total) by mouth 2 (two) times daily.    . metoprolol (TOPROL-XL) 200 MG 24 hr tablet Take 200 mg by mouth daily after breakfast.     . Misc Natural Products (OSTEO BI-FLEX ADV TRIPLE ST) TABS Take 500 each by mouth daily.     . nitroGLYCERIN (NITROSTAT) 0.4 MG SL tablet Place 0.4 mg under the tongue every 5 (five) minutes as needed for chest pain.    . pravastatin (PRAVACHOL) 80 MG tablet Take 80 mg by mouth every evening.     . sacubitril-valsartan (ENTRESTO) 49-51 MG Take 1 tablet by mouth 2 (two) times daily. 60 tablet 11  . sitaGLIPtin (JANUVIA) 50 MG tablet Take 50 mg by mouth daily.    Marland Kitchen spironolactone (ALDACTONE) 25 MG tablet Take 1 tablet (25 mg total) by mouth daily. 90 tablet 3  . VIAGRA 100 MG tablet Take 100 mg by mouth as needed for erectile dysfunction.  3  . warfarin (COUMADIN) 5 MG tablet Take 5 mg by mouth daily.      No current facility-administered medications for this visit.      Allergies:  Allergies  Allergen Reactions  . Doxycycline Hives  Past Medical History, Surgical history, Social history, and Family History were reviewed and updated.    Physical Exam: Blood pressure 134/73, pulse 89, temperature 98.3 F (36.8 C), temperature source Oral, resp. rate 18, height 6' (1.829 m), weight 225 lb 1.6 oz (102.1 kg), SpO2 98 %. ECOG: 1 General appearance: Well-appearing gentleman appeared without distress. Head: Normocephalic, without obvious abnormality .  Without oral ulcers or lesions. Neck: no adenopathy or masses. Lymph nodes: Cervical, supraclavicular, and axillary nodes  normal. Heart: Bradycardic and have regular. Metallic click noted.  Chest wall examination revealed  well-healed scar.  No pain or tenderness noted. Lung:chest clear, no wheezing, rales, normal symmetric air entry Abdomen: soft, non-tender, without masses or organomegaly no shifting dullness or ascites. EXT:no erythema, induration, or nodules   Lab Results: Lab Results  Component Value Date   WBC 6.8 07/09/2017   HGB 13.1 07/09/2017   HCT 39.8 07/09/2017   MCV 91.4 07/09/2017   PLT 187 07/09/2017     Chemistry      Component Value Date/Time   NA 142 05/06/2017 1357   NA 141 01/03/2017 0838   K 4.3 05/06/2017 1357   K 4.5 01/03/2017 0838   CL 99 05/06/2017 1357   CL 106 01/21/2013 1050   CO2 23 05/06/2017 1357   CO2 25 01/03/2017 0838   BUN 50 (H) 05/06/2017 1357   BUN 51.9 (H) 01/03/2017 0838   CREATININE 1.23 05/06/2017 1357   CREATININE 1.4 (H) 01/03/2017 0838      Component Value Date/Time   CALCIUM 9.2 05/06/2017 1357   CALCIUM 9.3 01/03/2017 0838   ALKPHOS 126 01/03/2017 0838   AST 24 01/03/2017 0838   ALT 22 01/03/2017 0838   BILITOT 0.42 01/03/2017 0838       Impression and Plan:  66 year old gentleman with the following issues:  1. 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 He is status post excisional biopsy and ileocolectomy followed by systemic chemotherapy. Imaging studies following that showed a complete response and have been in remission since October 2014.  CT scan on 06/21/2015 continues to show no evidence of disease relapse.   His physical examination and laboratory data today did not support any evidence of relapsed disease.  The plan is to continue with active surveillance every 6 months for another year and then annually after that.  Imaging studies will be done as needed.  2. Anemia: Resolved at this time with a normal hemoglobin noted.   3. Chronic renal insufficiency: Likely  related to long-standing hypertension and diabetes with creatinine continues to be stable.  4. Colon polyps: He is up-to-date on colonoscopy screening.  5. Followup: Will be in 6 months.      Braylie Badami 10/30/20188:39 AM

## 2017-07-09 NOTE — Telephone Encounter (Signed)
Scheduled appt per 10//30 los - Gave patient AVS and calender per los.

## 2017-08-23 ENCOUNTER — Encounter: Payer: Self-pay | Admitting: Cardiology

## 2017-08-25 NOTE — Progress Notes (Signed)
HPI The patient presents for follow of MVR and atrial fib.  After a recent visit I ordered an echo which demonstrated a stable MVR and stable mild aortic valve disease.  However, the EF was moderately reduced.  His EF at the previous echo was 50 although it had been reduced in the more distant past.  It was now 35%.  I sent him for a Lexiscan Myoview.  He had a reduced EF with a new anterior wall perfusion defect that was not present on the previous perfusion study.  He underwent cardiac cath.  He had an occludes SVG to the RCA but flow through the native vessel.  The LAD was occluded but with good flow through the LIMA.  He returned for follow up and I started Entresto.  He now returns for further med titration.   At the last visit I was not able to titrate his meds.    Since then he has done well.  The patient denies any new symptoms such as chest discomfort, neck or arm discomfort. There has been no new shortness of breath, PND or orthopnea. There have been no reported palpitations, presyncope or syncope.  He remains active.  He works cleaning his yard and can run the chain saw.    Allergies  Allergen Reactions  . Doxycycline Hives    Current Outpatient Medications  Medication Sig Dispense Refill  . aspirin 81 MG chewable tablet Chew 81 mg by mouth daily.    Marland Kitchen dexlansoprazole (DEXILANT) 60 MG capsule Take 60 mg by mouth daily.    . furosemide (LASIX) 40 MG tablet Take 40 mg by mouth daily.     Marland Kitchen glipiZIDE (GLUCOTROL XL) 10 MG 24 hr tablet Take 10 mg by mouth daily.     . insulin glargine (LANTUS) 100 UNIT/ML injection Inject 85 Units into the skin 2 (two) times daily as needed (takes 85 units every night at bedtime 2 nd dose only if needed for elevated blood sugars).     . metFORMIN (GLUCOPHAGE) 1000 MG tablet Take 500 mg by mouth 2 (two) times daily with a meal.    . metoprolol (TOPROL-XL) 200 MG 24 hr tablet Take 200 mg by mouth daily after breakfast.     . Misc Natural Products  (OSTEO BI-FLEX ADV TRIPLE ST) TABS Take 500 each by mouth daily.     . nitroGLYCERIN (NITROSTAT) 0.4 MG SL tablet Place 0.4 mg under the tongue every 5 (five) minutes as needed for chest pain.    . pravastatin (PRAVACHOL) 80 MG tablet Take 80 mg by mouth every evening.     . sacubitril-valsartan (ENTRESTO) 49-51 MG Take 1 tablet by mouth 2 (two) times daily. 60 tablet 11  . sitaGLIPtin (JANUVIA) 50 MG tablet Take 50 mg by mouth daily.    Marland Kitchen VIAGRA 100 MG tablet Take 100 mg by mouth as needed for erectile dysfunction.  3  . warfarin (COUMADIN) 5 MG tablet Take 5 mg by mouth daily.     Marland Kitchen spironolactone (ALDACTONE) 25 MG tablet Take 1 tablet (25 mg total) by mouth daily. 90 tablet 3   No current facility-administered medications for this visit.     Past Medical History:  Diagnosis Date  . Anemia    "lost blood w/the cancer"  . Arthritis    back   . Atrial fibrillation ([place])    takes Coumadin daily  . Bruises easily    takes Coumadin daily  . CAD (coronary artery disease)  a. s/p CABG 11/2000 (L-LAD, S-RCA);  b. Lexiscan Myoview 11/12: EF 45%, ischemia and possibly some scar at the base of the inferolateral wall;   c. Lex MV 11/13:  EF 44%, Inf and IL scar/soft tissue atten, small mild amt of inf ischemia,  . Constipation    takes Colace daily  . Enlarged prostate   . Flu 09/2013  . GERD (gastroesophageal reflux disease)    takes Nexium daily  . Glaucoma   . History of blood transfusion    "related to cancer"  . History of colon polyps   . HLD (hyperlipidemia)    takes Vytorin daily  . HTN (hypertension)    takes Amlodipine,Metoprolol, and Ramipril daily  . Insomnia    states he has always been like this.But doesn't take any meds  . Kidney stones    "I passed them all"  . Nocturia   . Peripheral edema    takes Furosemide daily  . Peripheral neuropathy   . Rheumatic fever ~ 1965  . Rheumatic heart disease    a. s/p mechanical (St. Jude) MVR 2002;  b. Echo 5/11: Mild LVH, EF  45-50%, mild AS, mild AI, mean gradient 11 mmHg, MVR with normal gradients, moderate LAE, mild RAE;  c.  Echo 11/13:   mod LVH, EF 55-60%, mild AS (mean 18 mmHg), mild AI, severe LAE, mild RAE, PASP 41  . 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 SOB (shortness of breath)    with exertion  . Type II diabetes mellitus ([place])    takes Januvia,Glipizide,and Metformin daily as well as Lantus    Past Surgical History:  Procedure Laterality Date  . APPLICATION OF WOUND VAC  2014  . CARDIAC CATHETERIZATION  2002  . COLONOSCOPY    . CORONARY ARTERY BYPASS GRAFT  2002   x 3  . ESOPHAGOGASTRODUODENOSCOPY    . HERNIA REPAIR    . INCISIONAL HERNIA REPAIR N/A 03/15/2014   Procedure: LAPAROSCOPIC INCISIONAL HERNIA;  Surgeon: Harl Bowie, MD;  Location: [facility];  Service: General;  Laterality: N/A;  . INSERTION OF MESH N/A 03/15/2014   Procedure: INSERTION OF MESH;  Surgeon: Harl Bowie, MD;  Location: [facility];  Service: General;  Laterality: N/A;  . LAPAROSCOPIC INCISIONAL / UMBILICAL / [place]  03/15/2014   IHR  . LAPAROTOMY N/A 02/09/2013   Procedure: EXPLORATORY LAPAROTOMY WITH incisional biopsy intra- ABDOMINAL MASS ILOCECTOMY ;  Surgeon: Harl Bowie, MD;  [facility];  Service: General;  Laterality: N/A;  . MITRAL VALVE REPLACEMENT  11/2000   #33 St. Jude mechanical mitral valve prosthesis. Dr. Servando Snare  . PORT-A-CATH REMOVAL  03/15/2014  . PORT-A-CATH REMOVAL Left 03/15/2014   Procedure: REMOVAL PORT-A-CATH;  Surgeon: Harl Bowie, MD;  Location: [facility];  Service: General;  Laterality: Left;  . PORTACATH PLACEMENT N/A 02/09/2013   Procedure: INSERTION PORT-A-CATH;  Surgeon: Harl Bowie, MD;  [facility];  Service: General;  Laterality: N/A;  . RIGHT/LEFT HEART CATH AND CORONARY/GRAFT ANGIOGRAPHY N/A 05/10/2017   Procedure: RIGHT/LEFT HEART CATH AND CORONARY/GRAFT ANGIOGRAPHY;  Surgeon: Martinique, Peter M, MD;  Location: [facility];  Service: Cardiovascular;  Laterality: N/A;  . SEPTOPLASTY      ROS: As stated in the HPI and negative for all other systems.   PHYSICAL EXAM BP 116/65   Pulse 72   Ht 6' (1.829 m)   Wt 221 lb (100.2 kg)  BMI 29.97 kg/m   GENERAL:  Well appearing NECK:  No jugular venous distention, waveform within normal limits, carotid upstroke brisk and symmetric, no bruits, no thyromegaly LUNGS:  Clear to auscultation bilaterally CHEST:  Unremarkable HEART:  PMI not displaced or sustained,S1 mechanical and S2 within normal limits, no S3, no S4, no clicks, no rubs, 2/6 apical systolic murmur, no diastolic murmurs ABD:  Flat, positive bowel sounds normal in frequency in pitch, no bruits, no rebound, no guarding, no midline pulsatile mass, no hepatomegaly, no splenomegaly EXT:  2 plus pulses throughout, no edema, no cyanosis no clubbing   ASSESSMENT AND PLAN  CARDIOMYOPATHY -  The etiology of this is not entirely clear.  He had some difficulty tolerating the last med titration so I am not going to try to go up on the current dose although I will in the future if his blood pressure and symptoms allow.  He follow-up with an echocardiogram in the spring.   CAD - The patient has no new sypmtoms.  No further cardiovascular testing is indicated.  We will continue with aggressive risk reduction and meds as listed.  ATRIAL FIBRILLATION - He tolerates anticoagulation and rate control.  No change in therapy.  MECHANICAL MVR -  He had stable valve function in April and I will check this again next year.   AORTIC VALVE STENOSIS:     This is mild AS and AI. I will follow this clinically.

## 2017-08-28 ENCOUNTER — Ambulatory Visit: Payer: Medicare HMO | Admitting: Cardiology

## 2017-08-28 ENCOUNTER — Encounter: Payer: Self-pay | Admitting: Cardiology

## 2017-08-28 VITALS — BP 116/65 | HR 72 | Ht 72.0 in | Wt 221.0 lb

## 2017-08-28 DIAGNOSIS — Z952 Presence of prosthetic heart valve: Secondary | ICD-10-CM

## 2017-08-28 DIAGNOSIS — I482 Chronic atrial fibrillation, unspecified: Secondary | ICD-10-CM

## 2017-08-28 DIAGNOSIS — I2510 Atherosclerotic heart disease of native coronary artery without angina pectoris: Secondary | ICD-10-CM

## 2017-08-28 NOTE — Patient Instructions (Signed)
Medication Instructions:  The current medical regimen is effective;  continue present plan and medications.  Follow-Up: Follow up in April with Dr Percival Spanish in [place].  If you need a refill on your cardiac medications before your next appointment, please call your pharmacy.  Thank you for choosing [facility]!!

## 2017-09-23 ENCOUNTER — Ambulatory Visit (INDEPENDENT_AMBULATORY_CARE_PROVIDER_SITE_OTHER): Payer: PPO | Admitting: Family Medicine

## 2017-09-23 ENCOUNTER — Encounter: Payer: Self-pay | Admitting: Family Medicine

## 2017-09-23 VITALS — BP 127/70 | HR 83 | Temp 97.9°F | Ht 69.0 in | Wt 220.6 lb

## 2017-09-23 DIAGNOSIS — Z794 Long term (current) use of insulin: Secondary | ICD-10-CM

## 2017-09-23 DIAGNOSIS — E119 Type 2 diabetes mellitus without complications: Secondary | ICD-10-CM | POA: Diagnosis not present

## 2017-09-23 DIAGNOSIS — I10 Essential (primary) hypertension: Secondary | ICD-10-CM

## 2017-09-23 DIAGNOSIS — E785 Hyperlipidemia, unspecified: Secondary | ICD-10-CM

## 2017-09-23 DIAGNOSIS — Z9889 Other specified postprocedural states: Secondary | ICD-10-CM

## 2017-09-23 DIAGNOSIS — E1121 Type 2 diabetes mellitus with diabetic nephropathy: Secondary | ICD-10-CM | POA: Insufficient documentation

## 2017-09-23 DIAGNOSIS — E1169 Type 2 diabetes mellitus with other specified complication: Secondary | ICD-10-CM | POA: Insufficient documentation

## 2017-09-23 LAB — COAGUCHEK XS/INR WAIVED
INR: 3.1 — ABNORMAL HIGH (ref 0.9–1.1)
Prothrombin Time: 37.8 s

## 2017-09-23 LAB — BAYER DCA HB A1C WAIVED: HB A1C (BAYER DCA - WAIVED): 6.5 % (ref <7.0)

## 2017-09-23 NOTE — Patient Instructions (Signed)
Great to meet you!  Come back in 1 month to have your INR checked with a nurse  Come back in 3 months unless you need [place] sooner.

## 2017-09-23 NOTE — Progress Notes (Signed)
   HPI  Patient presents today here for establishing care.   History Patient has complex medical history including CABG, mitral valve replacement, type 2 diabetes, BPH, colon polyps, hyperlipidemia, hypertension, kidney stones, rheumatic fever and rheumatic heart disease, small bowel lymphoma Family history: Mother, brother, brother, heart disease and brother Social history: No alcohol use, former smoker Surgical history: Colonoscopy, CABG, EGD, cardiac cath, incisional hernia repair, mitral valve replacement, small intestine surgery  Patient reports good medication compliance with Coumadin. He denies any dietary indiscretion or bleeding. His last INR was greater than 1 month ago.  Type 2 diabetes Patient states that he has a friend that had severe consequences from uncontrolled diabetes which caused him to make aggressive lifestyle changes He takes a varied amount of Lantus, up to 85 units No hypoglycemia Average fasting blood sugar is 80-100. Average evening sugar is 150-180. Good medication compliance with other medications as well.  Hyperlipidemia Good medication compliance. Nonfasting today  PMH: Smoking status noted ROS: Per HPI  Objective: BP 127/70   Pulse 83   Temp 97.9 F (36.6 C) (Oral)   Ht _0  (1.753 m)   Wt 220 lb 9.6 oz (100.1 kg)   BMI 32.58 kg/m  Gen: NAD, alert, cooperative with exam HEENT: NCAT, EOMI, PERRL, arcus senilis bilaterally CV: RRR, good S1/S2, no murmur Resp: CTABL, no wheezes, non-labored Abd: SNTND, BS present, no guarding or organomegaly Ext: No edema, warm Neuro: Alert and oriented, No gross deficits  Assessment and plan:  #Mitral valve replacement, chronic anticoagulation INR 3.1 today with therapeutic Anticoagulation goal 2.5-3.5 Follow-up 4-6 weeks for routine INR check  #Hypertension Well-controlled  Continue beta-blocker, Entresto, Lasix, Aldactone   #Systolic CHF Appears euvolemic As above continue beta-blocker,  Entresto, Lasix, Aldactone History of ischemic cardiomyopathy now status post CABG with rheumatic fever leading to mitral valve replacement.   #Hyperlipidemia With LDL goal less than 70, continue statin LDL today   Orders Placed This Encounter  Procedures  . CMP14+EGFR  . LDL Cholesterol, Direct  . TSH  . Bayer DCA Hb A1c Waived  . CoaguChek XS/INR Carney Bern, MD [facility] 09/23/2017, 5:12 PM

## 2017-09-24 ENCOUNTER — Telehealth: Payer: Self-pay | Admitting: Family Medicine

## 2017-09-24 LAB — LDL CHOLESTEROL, DIRECT: LDL Direct: 88 mg/dL (ref 0–99)

## 2017-09-24 LAB — CMP14+EGFR
ALT: 22 IU/L (ref 0–44)
AST: 23 IU/L (ref 0–40)
Albumin/Globulin Ratio: 1.3 (ref 1.2–2.2)
Albumin: 3.9 g/dL (ref 3.6–4.8)
Alkaline Phosphatase: 114 IU/L (ref 39–117)
BUN/Creatinine Ratio: 28 — ABNORMAL HIGH (ref 10–24)
BUN: 37 mg/dL — ABNORMAL HIGH (ref 8–27)
Bilirubin Total: 0.4 mg/dL (ref 0.0–1.2)
CO2: 23 mmol/L (ref 20–29)
Calcium: 9.2 mg/dL (ref 8.6–10.2)
Chloride: 108 mmol/L — ABNORMAL HIGH (ref 96–106)
Creatinine, Ser: 1.33 mg/dL — ABNORMAL HIGH (ref 0.76–1.27)
GFR calc Af Amer: 65 mL/min/{1.73_m2} (ref >59)
GFR calc non Af Amer: 56 mL/min/{1.73_m2} — ABNORMAL LOW (ref >59)
Globulin, Total: 3 g/dL (ref 1.5–4.5)
Glucose: 93 mg/dL (ref 65–99)
Potassium: 5.1 mmol/L (ref 3.5–5.2)
Sodium: 146 mmol/L — ABNORMAL HIGH (ref 134–144)
Total Protein: 6.9 g/dL (ref 6.0–8.5)

## 2017-09-24 LAB — TSH: TSH: 3.32 u[IU]/mL (ref 0.450–4.500)

## 2017-09-24 NOTE — Telephone Encounter (Signed)
Refer to lab note °

## 2017-10-08 ENCOUNTER — Telehealth: Payer: Self-pay | Admitting: *Deleted

## 2017-10-08 NOTE — Telephone Encounter (Signed)
Patient walked into the office today with a letter from [employer]. PA for entresto started. We should get a response within 72 hours. Patient aware awaiting approval.

## 2017-10-15 ENCOUNTER — Telehealth: Payer: Self-pay | Admitting: Cardiology

## 2017-10-15 NOTE — Telephone Encounter (Signed)
New message  John [name] verbalized that he is calling for the RN   To speak to her again about the medication [name]  He said that it needs to state the patients definitive diagnosis   With the stage of heart failurre

## 2017-10-15 NOTE — Telephone Encounter (Signed)
Routed to [place], [place]

## 2017-10-16 LAB — POCT INR: INR: 2.6 (ref 2.0–3.0)

## 2017-10-16 NOTE — Telephone Encounter (Signed)
PA send to [facility] thru covermymeds await determination.

## 2017-10-16 NOTE — Telephone Encounter (Signed)
Spoke with pt letting him know entresto was approved from 10/16/2017 until 09/09/2018

## 2017-10-21 ENCOUNTER — Telehealth: Payer: Self-pay | Admitting: Cardiology

## 2017-10-21 NOTE — Telephone Encounter (Signed)
John [name] ( [employer]) is calling because she states you will have to call the insurance plan for this prior Auth of Entresto . The phone number is [phone]. Thanks

## 2017-10-21 NOTE — Telephone Encounter (Signed)
Prior Auth for pt John [name] was approved on 02/06

## 2017-10-24 ENCOUNTER — Ambulatory Visit (INDEPENDENT_AMBULATORY_CARE_PROVIDER_SITE_OTHER): Payer: PPO | Admitting: *Deleted

## 2017-10-24 DIAGNOSIS — Z952 Presence of prosthetic heart valve: Secondary | ICD-10-CM

## 2017-10-24 DIAGNOSIS — Z7901 Long term (current) use of anticoagulants: Secondary | ICD-10-CM

## 2017-10-24 LAB — COAGUCHEK XS/INR WAIVED
INR: 4.3 — ABNORMAL HIGH (ref 0.9–1.1)
Prothrombin Time: 51.9 s

## 2017-10-25 NOTE — Progress Notes (Signed)
Subjective:     Indication: Mitral valve replacement Bleeding signs/symptoms: None Thromboembolic signs/symptoms: None  Missed Coumadin doses: None Medication changes: no Dietary changes: no Bacterial/viral infection: no Other concerns: no  The following portions of the patient's history were reviewed and updated as appropriate: allergies and current medications.  Review of Systems Pertinent items are noted in HPI.   Objective:    INR Today: 4.3 Current dose: 7.5 mg on Wed and 5 mg all other days    Assessment:    Supratherapeutic INR for goal of 2.5-3.5   Plan:    1. New dose: 5 mg daily   2. Next INR: 2 weeks    Chong Sicilian, RN  10/24/17

## 2017-11-05 ENCOUNTER — Encounter: Payer: Self-pay | Admitting: Family Medicine

## 2017-11-05 ENCOUNTER — Ambulatory Visit (INDEPENDENT_AMBULATORY_CARE_PROVIDER_SITE_OTHER): Payer: PPO | Admitting: Family Medicine

## 2017-11-05 VITALS — BP 123/66 | HR 67 | Temp 97.6°F | Ht 69.0 in | Wt 219.4 lb

## 2017-11-05 DIAGNOSIS — R600 Localized edema: Secondary | ICD-10-CM | POA: Diagnosis not present

## 2017-11-05 MED ORDER — FUROSEMIDE 40 MG PO TABS: 80.0000 mg | ORAL_TABLET | Freq: Every day | ORAL | 0 refills | Status: DC

## 2017-11-07 ENCOUNTER — Ambulatory Visit (INDEPENDENT_AMBULATORY_CARE_PROVIDER_SITE_OTHER): Payer: PPO | Admitting: *Deleted

## 2017-11-07 DIAGNOSIS — Z7901 Long term (current) use of anticoagulants: Secondary | ICD-10-CM

## 2017-11-07 DIAGNOSIS — Z952 Presence of prosthetic heart valve: Secondary | ICD-10-CM

## 2017-11-07 LAB — COAGUCHEK XS/INR WAIVED
INR: 3.2 — ABNORMAL HIGH (ref 0.9–1.1)
Prothrombin Time: 37.8 s

## 2017-11-07 NOTE — Progress Notes (Signed)
Subjective:     Indication: mitral valve replacement Bleeding signs/symptoms: None Thromboembolic signs/symptoms: None  Missed Coumadin doses: None Medication changes: no Dietary changes: no Bacterial/viral infection: no Other concerns: no  The following portions of the patient's history were reviewed and updated as appropriate: allergies and current medications.  Review of Systems Pertinent items are noted in HPI.   Objective:    INR Today: 3.2 Current dose: 2.5-3.5    Assessment:    Therapeutic INR for goal of 2.5-3.5   Plan:    1. New dose: no change   2. Next INR: 1 month with Dr Wendi Snipes  Chong Sicilian, RN

## 2017-11-12 ENCOUNTER — Encounter: Payer: Self-pay | Admitting: Family Medicine

## 2017-11-12 ENCOUNTER — Ambulatory Visit (INDEPENDENT_AMBULATORY_CARE_PROVIDER_SITE_OTHER): Payer: PPO | Admitting: Family Medicine

## 2017-11-12 DIAGNOSIS — I5020 Unspecified systolic (congestive) heart failure: Secondary | ICD-10-CM | POA: Insufficient documentation

## 2017-11-12 DIAGNOSIS — I5022 Chronic systolic (congestive) heart failure: Secondary | ICD-10-CM | POA: Diagnosis not present

## 2017-11-12 NOTE — Progress Notes (Signed)
   HPI  Patient presents today follow-up for leg swelling.  Patient had an episode of leg swelling about 1 week ago.  His Lasix was doubled and he states that he is improved very much.  He has had good urine output with 80 mg of Lasix.  He denies any side effects.  PMH: Smoking status noted ROS: Per HPI  Objective: BP 119/69   Pulse 78   Temp (!) 94.9 F (34.9 C) (Oral)   Ht 5\' 9"  (1.753 m)   Wt 214 lb 6.4 oz (97.3 kg)   BMI 31.66 kg/m  Gen: NAD, alert, cooperative with exam HEENT: NCAT CV: RRR, good S1/S2, no murmur Resp: CTABL, no wheezes, non-labored Ext: Trace pitting edema left lower extremity, hemosiderin staining Neuro: Alert and oriented, No gross deficits  Assessment and plan:  #Systolic CHF Leg swelling improved, patient appears euvolemic Decreased back to 40 mg of Lasix daily. Discussed daily weights, recommended increasing Lasix to 2 pills daily for 3 days for any weight gain greater than 3 pounds in 1 day or 5 pounds in 3 days.    Laroy Apple, MD [facility] 11/12/2017, 8:06 AM

## 2017-11-12 NOTE — Patient Instructions (Signed)
Great to see you!  Take 2 lasix daily for for 3 days for any weight gain in 1 day of 3 lbs or more, or for any weight gain of more than 5 lbs in 3 days.

## 2017-11-13 LAB — BMP8+EGFR
BUN/Creatinine Ratio: 37 — ABNORMAL HIGH (ref 10–24)
BUN: 57 mg/dL — ABNORMAL HIGH (ref 8–27)
CO2: 21 mmol/L (ref 20–29)
Calcium: 9.3 mg/dL (ref 8.6–10.2)
Chloride: 99 mmol/L (ref 96–106)
Creatinine, Ser: 1.55 mg/dL — ABNORMAL HIGH (ref 0.76–1.27)
GFR calc Af Amer: 54 mL/min/{1.73_m2} — ABNORMAL LOW (ref >59)
GFR calc non Af Amer: 47 mL/min/{1.73_m2} — ABNORMAL LOW (ref >59)
Glucose: 197 mg/dL — ABNORMAL HIGH (ref 65–99)
Potassium: 5.1 mmol/L (ref 3.5–5.2)
Sodium: 139 mmol/L (ref 134–144)

## 2017-11-14 ENCOUNTER — Encounter: Payer: Self-pay | Admitting: Family Medicine

## 2017-11-14 NOTE — Progress Notes (Signed)
Chief Complaint  Patient presents with  . Edema    pt here today c/o left lower leg and foot swollen for the past 2 weeks    HPI  Patient presents today for edema intermittently BLE for 2 weeks. Ankles swollen. Denies dyspnea, fatigue.  PMH: Smoking status noted ROS: Per HPI  Objective: BP 123/66   Pulse 67   Temp 97.6 F (36.4 C) (Oral)   Ht 5\' 9"  (1.753 m)   Wt 219 lb 6 oz (99.5 kg)   BMI 32.40 kg/m  Gen: NAD, alert, cooperative with exam HEENT: NCAT, EOMI, PERRL CV: RRR, good S1/S2, no murmur Resp: CTABL, no wheezes, non-labored Abd: SNTND, BS present, no guarding or organomegaly Ext: Mild  Edema at both ankles. warm Neuro: Alert and oriented, No gross deficits  Assessment and plan:  1. Localized edema     Meds ordered this encounter  Medications  . DISCONTD: furosemide (LASIX) 40 MG tablet    Sig: Take 2 tablets (80 mg total) by mouth daily.    Dispense:  60 tablet    Refill:  0      Follow up as needed.  Claretta Fraise, MD

## 2017-11-29 ENCOUNTER — Other Ambulatory Visit: Payer: Self-pay | Admitting: *Deleted

## 2017-11-29 MED ORDER — FUROSEMIDE 40 MG PO TABS: 40.0000 mg | ORAL_TABLET | Freq: Every day | ORAL | 0 refills | Status: DC

## 2017-12-05 ENCOUNTER — Ambulatory Visit: Payer: PPO | Admitting: Family Medicine

## 2017-12-16 NOTE — Progress Notes (Signed)
HPI The patient presents for follow of MVR and atrial fib.  In April 2018 an echo demonstrated a stable MVR and stable mild aortic valve disease.  However, the EF was moderately reduced.  His EF at the previous echo was 50 although it had been reduced in the more distant past.  It was now 35%.  I sent him for a Lexiscan Myoview.  He had a reduced EF with a new anterior wall perfusion defect that was not present on the previous perfusion study.  He underwent cardiac cath.  He had an occluded SVG to the RCA but flow through the native vessel.  The LAD was occluded but with good flow through the LIMA.  He is now being treated with Entresto.    Since I last saw him he has been doing well.  Is been working in the yard.  The patient denies any new symptoms such as chest discomfort, neck or arm discomfort. There has been no new shortness of breath, PND or orthopnea. There have been no reported palpitations, presyncope or syncope.   Allergies  Allergen Reactions  . Doxycycline Hives    Current Outpatient Medications  Medication Sig Dispense Refill  . aspirin 81 MG chewable tablet Chew 81 mg by mouth daily.    Marland Kitchen dexlansoprazole (DEXILANT) 60 MG capsule Take 60 mg by mouth daily.    . furosemide (LASIX) 40 MG tablet Take 1 tablet (40 mg total) by mouth daily. 90 tablet 0  . glipiZIDE (GLUCOTROL XL) 10 MG 24 hr tablet Take 10 mg by mouth daily.     . insulin glargine (LANTUS) 100 UNIT/ML injection Inject 85 Units into the skin 2 (two) times daily as needed (takes 85 units every night at bedtime 2 nd dose only if needed for elevated blood sugars).     . metFORMIN (GLUCOPHAGE) 500 MG tablet Take 500 mg by mouth 2 (two) times daily with a meal.    . metoprolol (TOPROL-XL) 200 MG 24 hr tablet Take 200 mg by mouth daily after breakfast.     . Misc Natural Products (OSTEO BI-FLEX ADV TRIPLE ST) TABS Take 500 each by mouth daily.     . pravastatin (PRAVACHOL) 80 MG tablet Take 80 mg by mouth every  evening.     . sacubitril-valsartan (ENTRESTO) 49-51 MG Take 1 tablet by mouth 2 (two) times daily. 60 tablet 11  . sitaGLIPtin (JANUVIA) 50 MG tablet Take 50 mg by mouth daily.    Marland Kitchen spironolactone (ALDACTONE) 25 MG tablet Take 25 mg by mouth daily.  2  . VIAGRA 100 MG tablet Take 50 mg by mouth as needed.   3  . warfarin (COUMADIN) 5 MG tablet Take 5 mg by mouth daily. 1 tablet daily exceot Wed 1 1/2 pm.    . nitroGLYCERIN (NITROSTAT) 0.4 MG SL tablet Place 0.4 mg under the tongue every 5 (five) minutes as needed for chest pain.     No current facility-administered medications for this visit.     Past Medical History:  Diagnosis Date  . Allergy   . Anemia    "lost blood w/the cancer"  . Arthritis    back   . Atrial fibrillation ([place])    takes Coumadin daily  . Bruises easily    takes Coumadin daily  . CAD (coronary artery disease)    a. s/p CABG 11/2000 (L-LAD, S-RCA);  b. Lexiscan Myoview 11/12: EF 45%, ischemia and possibly some scar at the base of the  inferolateral wall;   c. Lex MV 11/13:  EF 44%, Inf and IL scar/soft tissue atten, small mild amt of inf ischemia,  . CHF (congestive heart failure) ([place])   . Constipation    takes Colace daily  . Enlarged prostate   . Flu 09/2013  . GERD (gastroesophageal reflux disease)    takes Nexium daily  . Glaucoma   . History of blood transfusion    "related to cancer"  . History of colon polyps   . HLD (hyperlipidemia)    takes Vytorin daily  . HTN (hypertension)    takes Amlodipine,Metoprolol, and Ramipril daily  . Insomnia    states he has always been like this.But doesn't take any meds  . Kidney stones    "I passed them all"  . Nocturia   . Peripheral edema    takes Furosemide daily  . Peripheral neuropathy   . Rheumatic fever ~ 1965  . Rheumatic heart disease    a. s/p mechanical (St. Jude) MVR 2002;  b. Echo 5/11: Mild LVH, EF 45-50%, mild AS, mild AI, mean gradient 11 mmHg, MVR with normal gradients, moderate LAE, mild  RAE;  c.  Echo 11/13:   mod LVH, EF 55-60%, mild AS (mean 18 mmHg), mild AI, severe LAE, mild RAE, PASP 41  . 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 SOB (shortness of breath)    with exertion  . Type II diabetes mellitus ([place])    takes Januvia,Glipizide,and Metformin daily as well as Lantus    Past Surgical History:  Procedure Laterality Date  . APPLICATION OF WOUND VAC  2014  . CARDIAC CATHETERIZATION  2002  . COLONOSCOPY    . CORONARY ARTERY BYPASS GRAFT  2002   x 3  . CORONARY ARTERY BYPASS GRAFT    . ESOPHAGOGASTRODUODENOSCOPY    . HERNIA REPAIR    . INCISIONAL HERNIA REPAIR N/A 03/15/2014   Procedure: LAPAROSCOPIC INCISIONAL HERNIA;  Surgeon: Harl Bowie, MD;  Location: [facility];  Service: General;  Laterality: N/A;  . INSERTION OF MESH N/A 03/15/2014   Procedure: INSERTION OF MESH;  Surgeon: Harl Bowie, MD;  Location: [facility];  Service: General;  Laterality: N/A;  . LAPAROSCOPIC INCISIONAL / UMBILICAL / [place]  03/15/2014   IHR  . LAPAROTOMY N/A 02/09/2013   Procedure: EXPLORATORY LAPAROTOMY WITH incisional biopsy intra- ABDOMINAL MASS ILOCECTOMY ;  Surgeon: Harl Bowie, MD;  Location: [facility];  Service: General;  Laterality: N/A;  . MITRAL VALVE REPLACEMENT  11/2000   #33 St. Jude mechanical mitral valve prosthesis. Dr. Servando Snare  . PORT-A-CATH REMOVAL  03/15/2014  . PORT-A-CATH REMOVAL Left 03/15/2014   Procedure: REMOVAL PORT-A-CATH;  Surgeon: Harl Bowie, MD;  Location: [facility];  Service: General;  Laterality: Left;  . PORTACATH PLACEMENT N/A 02/09/2013   Procedure: INSERTION PORT-A-CATH;  Surgeon: Harl Bowie, MD;  Location: [facility];  Service: General;  Laterality: N/A;  . RIGHT/LEFT HEART CATH AND CORONARY/GRAFT ANGIOGRAPHY N/A 05/10/2017   Procedure: RIGHT/LEFT HEART CATH AND CORONARY/GRAFT ANGIOGRAPHY;  Surgeon: Martinique, Peter M, MD;  Location: [facility];  Service: Cardiovascular;  Laterality:  N/A;  . SEPTOPLASTY    . SMALL INTESTINE SURGERY      ROS: As stated in the HPI and negative for all other systems.   PHYSICAL EXAM BP 106/62   Pulse 62   Ht 5\' 9"  (1.753 m)   Wt 221 lb (100.2  kg)   BMI 32.64 kg/m   GENERAL:  Well appearing NECK:  No jugular venous distention, waveform within normal limits, carotid upstroke brisk and symmetric, no bruits, no thyromegaly LUNGS:  Clear to auscultation bilaterally CHEST:  Well healed sternotomy scar. HEART:  PMI not displaced or sustained, mechanical  S1 and S2 within normal limits, no S3, no S4, no clicks, no rubs, 2 out of 6 apical systolic murmur, no diastolic murmurs ABD:  Flat, positive bowel sounds normal in frequency in pitch, no bruits, no rebound, no guarding, no midline pulsatile mass, no hepatomegaly, no splenomegaly EXT:  2 plus pulses throughout, no edema, no cyanosis no clubbing   EKG: Atrial fibrillation, rate 62, right bundle branch block, no acute ST-T wave changes, nonspecific ST flattening.  No change from previous. 12/18/2017    ASSESSMENT AND PLAN  CARDIOMYOPATHY -  The EF was reduced.  I will check an EF in October.  I will continue the current meds.   CAD - The patient has no new sypmtoms.  No further cardiovascular testing is indicated.  We will continue with aggressive risk reduction and meds as listed.  ATRIAL FIBRILLATION - The tolerates this with rate control and anticoagulation.   MECHANICAL MVR -  He had stable valve function in April of last year.  I will follow up this and an EF in the fall as above.    AORTIC VALVE STENOSIS:   This will be followed as above.

## 2017-12-18 ENCOUNTER — Ambulatory Visit (INDEPENDENT_AMBULATORY_CARE_PROVIDER_SITE_OTHER): Payer: PPO | Admitting: Cardiology

## 2017-12-18 ENCOUNTER — Encounter: Payer: Self-pay | Admitting: Cardiology

## 2017-12-18 VITALS — BP 106/62 | HR 62 | Ht 69.0 in | Wt 221.0 lb

## 2017-12-18 DIAGNOSIS — Z952 Presence of prosthetic heart valve: Secondary | ICD-10-CM

## 2017-12-18 DIAGNOSIS — I255 Ischemic cardiomyopathy: Secondary | ICD-10-CM

## 2017-12-18 DIAGNOSIS — I482 Chronic atrial fibrillation, unspecified: Secondary | ICD-10-CM

## 2017-12-18 NOTE — Patient Instructions (Signed)
Medication Instructions:  The current medical regimen is effective;  continue present plan and medications.  Testing/Procedures: Your physician has requested that you have an echocardiogram in October 2019. Echocardiography is a painless test that uses sound waves to create images of your heart. It provides your doctor with information about the size and shape of your heart and how well your heart's chambers and valves are working. This procedure takes approximately one hour. There are no restrictions for this procedure.  Follow-Up: Follow up in 1 year with Dr. Percival Spanish.  You will receive a letter in the mail 2 months before you are due.  Please call us when you receive this letter to schedule your follow up appointment.  If you need a refill on your cardiac medications before your next appointment, please call your pharmacy.  Thank you for choosing [facility]!!

## 2017-12-24 ENCOUNTER — Encounter: Payer: Self-pay | Admitting: Family Medicine

## 2017-12-24 ENCOUNTER — Ambulatory Visit (INDEPENDENT_AMBULATORY_CARE_PROVIDER_SITE_OTHER): Payer: PPO | Admitting: Family Medicine

## 2017-12-24 VITALS — BP 137/74 | HR 79 | Temp 97.0°F | Ht 69.0 in | Wt 222.6 lb

## 2017-12-24 DIAGNOSIS — I2510 Atherosclerotic heart disease of native coronary artery without angina pectoris: Secondary | ICD-10-CM

## 2017-12-24 DIAGNOSIS — E119 Type 2 diabetes mellitus without complications: Secondary | ICD-10-CM

## 2017-12-24 DIAGNOSIS — Z794 Long term (current) use of insulin: Secondary | ICD-10-CM

## 2017-12-24 DIAGNOSIS — Z952 Presence of prosthetic heart valve: Secondary | ICD-10-CM | POA: Diagnosis not present

## 2017-12-24 DIAGNOSIS — Z7901 Long term (current) use of anticoagulants: Secondary | ICD-10-CM | POA: Diagnosis not present

## 2017-12-24 LAB — COAGUCHEK XS/INR WAIVED
INR: 3 — ABNORMAL HIGH (ref 0.9–1.1)
Prothrombin Time: 35.5 s

## 2017-12-24 LAB — BAYER DCA HB A1C WAIVED: HB A1C (BAYER DCA - WAIVED): 7.1 % — ABNORMAL HIGH (ref <7.0)

## 2017-12-24 NOTE — Progress Notes (Signed)
   HPI  Patient presents today here for follow-up medical problems.  Type 2 diabetes Patient with intermittent insulin use, uses 85 units of Lantus at night only when needed, patient states his blood sugar fluctuates between 102 100 at night, that will he makes his decision about taking the insulin or not. He does take Januvia, metformin, glipizide. No hypoglycemia, states lowest seen is been in 44s.  CAD Recent follow-up with cardiology No chest pain. Good med compliance.  Long-term use of anticoagulants For valvular A. fib, no bleeding, has decreased Coumadin dose to 35 mg weekly, 5 mg once daily  PMH: Smoking status noted ROS: Per HPI  Objective: BP 137/74   Pulse 79   Temp (!) 97 F (36.1 C) (Oral)   Ht 5' 9" (1.753 m)   Wt 222 lb 9.6 oz (101 kg)   BMI 32.87 kg/m  Gen: NAD, alert, cooperative with exam HEENT: NCAT CV: RRR, good S1/S2, no murmur Resp: CTABL, no wheezes, non-labored Ext: No edema, warm Neuro: Alert and oriented, No gross deficits  Assessment and plan:  #Type 2 diabetes A1c pending, expect good control Continue glipizide, Januvia, metformin Labs Continue Lantus, I am glad he is controlling his disease state with lower doses, however I am concerned about intermittent dosing, monitor closely  #Long-term use of anticoagulants Valvular A. fib INR - [place] at 3.0  #CAD No chest pain, asymptomatic Continue aspirin, statin     Orders Placed This Encounter  Procedures  . Microalbumin / creatinine urine ratio  . CoaguChek XS/INR Waived  . Bayer DCA Hb A1c Waived  . [place], MD [facility] 12/24/2017, 11:09 AM

## 2017-12-24 NOTE — Patient Instructions (Signed)
Great to see you!  Come back in 3 months unless you need us sooner.    

## 2017-12-25 LAB — MICROALBUMIN / CREATININE URINE RATIO
Creatinine, Urine: 141.2 mg/dL
Microalb/Creat Ratio: 101.7 mg/g creat — ABNORMAL HIGH (ref 0.0–30.0)
Microalbumin, Urine: 143.6 ug/mL

## 2017-12-25 LAB — CMP14+EGFR
ALT: 15 IU/L (ref 0–44)
AST: 20 IU/L (ref 0–40)
Albumin/Globulin Ratio: 1.6 (ref 1.2–2.2)
Albumin: 3.9 g/dL (ref 3.6–4.8)
Alkaline Phosphatase: 117 IU/L (ref 39–117)
BUN/Creatinine Ratio: 28 — ABNORMAL HIGH (ref 10–24)
BUN: 36 mg/dL — ABNORMAL HIGH (ref 8–27)
Bilirubin Total: 0.4 mg/dL (ref 0.0–1.2)
CO2: 23 mmol/L (ref 20–29)
Calcium: 9.2 mg/dL (ref 8.6–10.2)
Chloride: 108 mmol/L — ABNORMAL HIGH (ref 96–106)
Creatinine, Ser: 1.29 mg/dL — ABNORMAL HIGH (ref 0.76–1.27)
GFR calc Af Amer: 67 mL/min/{1.73_m2} (ref >59)
GFR calc non Af Amer: 58 mL/min/{1.73_m2} — ABNORMAL LOW (ref >59)
Globulin, Total: 2.5 g/dL (ref 1.5–4.5)
Glucose: 94 mg/dL (ref 65–99)
Potassium: 4.6 mmol/L (ref 3.5–5.2)
Sodium: 145 mmol/L — ABNORMAL HIGH (ref 134–144)
Total Protein: 6.4 g/dL (ref 6.0–8.5)

## 2018-01-01 ENCOUNTER — Ambulatory Visit: Payer: PPO

## 2018-01-07 ENCOUNTER — Telehealth: Payer: Self-pay

## 2018-01-07 ENCOUNTER — Inpatient Hospital Stay: Payer: PPO

## 2018-01-07 ENCOUNTER — Inpatient Hospital Stay: Payer: PPO | Attending: Oncology | Admitting: Oncology

## 2018-01-07 VITALS — BP 134/56 | HR 90 | Temp 98.1°F | Resp 18 | Ht 69.0 in | Wt 226.8 lb

## 2018-01-07 DIAGNOSIS — Z8572 Personal history of non-Hodgkin lymphomas: Secondary | ICD-10-CM | POA: Insufficient documentation

## 2018-01-07 DIAGNOSIS — C8590 Non-Hodgkin lymphoma, unspecified, unspecified site: Secondary | ICD-10-CM

## 2018-01-07 DIAGNOSIS — N189 Chronic kidney disease, unspecified: Secondary | ICD-10-CM

## 2018-01-07 DIAGNOSIS — C8299 Follicular lymphoma, unspecified, extranodal and solid organ sites: Secondary | ICD-10-CM

## 2018-01-07 LAB — COMPREHENSIVE METABOLIC PANEL
ALT: 23 U/L (ref 0–55)
AST: 27 U/L (ref 5–34)
Albumin: 3.8 g/dL (ref 3.5–5.0)
Alkaline Phosphatase: 113 U/L (ref 40–150)
Anion gap: 8 (ref 3–11)
BUN: 39 mg/dL — ABNORMAL HIGH (ref 7–26)
CO2: 21 mmol/L — ABNORMAL LOW (ref 22–29)
Calcium: 9.1 mg/dL (ref 8.4–10.4)
Chloride: 110 mmol/L — ABNORMAL HIGH (ref 98–109)
Creatinine, Ser: 1.25 mg/dL (ref 0.70–1.30)
GFR calc Af Amer: >60 mL/min (ref >60)
GFR calc non Af Amer: 59 mL/min — ABNORMAL LOW (ref >60)
Glucose, Bld: 171 mg/dL — ABNORMAL HIGH (ref 70–140)
Potassium: 4.9 mmol/L (ref 3.5–5.1)
Sodium: 139 mmol/L (ref 136–145)
Total Bilirubin: 0.4 mg/dL (ref 0.2–1.2)
Total Protein: 6.9 g/dL (ref 6.4–8.3)

## 2018-01-07 LAB — CBC WITH DIFFERENTIAL/PLATELET
Basophils Absolute: 0.1 10*3/uL (ref 0.0–0.1)
Basophils Relative: 1 %
Eosinophils Absolute: 0.1 10*3/uL (ref 0.0–0.5)
Eosinophils Relative: 2 %
HCT: 41.2 % (ref 38.4–49.9)
Hemoglobin: 13.8 g/dL (ref 13.0–17.1)
Lymphocytes Relative: 15 %
Lymphs Abs: 1 10*3/uL (ref 0.9–3.3)
MCH: 31.7 pg (ref 27.2–33.4)
MCHC: 33.5 g/dL (ref 32.0–36.0)
MCV: 94.7 fL (ref 79.3–98.0)
Monocytes Absolute: 0.7 10*3/uL (ref 0.1–0.9)
Monocytes Relative: 10 %
Neutro Abs: 4.9 10*3/uL (ref 1.5–6.5)
Neutrophils Relative %: 72 %
Platelets: 182 10*3/uL (ref 140–400)
RBC: 4.35 MIL/uL (ref 4.20–5.82)
RDW: 14.4 % (ref 11.0–14.6)
WBC: 6.8 10*3/uL (ref 4.0–10.3)

## 2018-01-07 NOTE — Progress Notes (Signed)
Hematology and Oncology Follow Up Visit  John [name] [id number] 09/26/1950 67 y.o. 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  He had disease under the diaphragm only with an abdominal mass.   Prior Therapy: He is status post exploratory laparotomy and excision of an intra-abdominal mass and ileocolectomy and a Port-A-Cath placement on 02/09/2013 Chemotherapy with CHOP-R cycle one given on 04/16/2013. He is status post 4 cycles of chemotherapy completed in 06/2013.  Current therapy: Active surveillance.  Interim History:  John [name] here for a follow-up visit.  Since the last visit, he continues to enjoy excellent performance status and quality of life.  He has gained a few pounds and has been eating well.  He remains active and attends to activities of daily living.  He does report some mild fatigue and dyspnea on exertion if he exceeds certain limit of activity.  He has cut down on his diabetes medication and his cardiac function reasonably improved.  He denies any lymphadenopathy or other constitutional symptoms.  He does not report any headaches, blurry vision, syncope or seizures.  He does not report any fevers, chills or sweats.  He does not report any chest pain, palpitation, orthopnea or leg edema.  He does not report any cough, wheezing or hemoptysis.  Has not reported any petechiae or bleeding. He does not report any frequency urgency or hesitancy.  He denies any arthralgias or myalgias.  He does not report any rashes or lesions.  Rest of his review of systems is negative.  Medications: I have reviewed the patient's current medications.  Current Outpatient Medications  Medication Sig Dispense Refill  . aspirin 81 MG chewable tablet Chew 81 mg by mouth daily.    Marland Kitchen dexlansoprazole (DEXILANT) 60 MG capsule Take 60 mg by mouth daily.    . furosemide (LASIX) 40 MG tablet Take 1 tablet (40 mg total) by mouth  daily. 90 tablet 0  . glipiZIDE (GLUCOTROL XL) 10 MG 24 hr tablet Take 10 mg by mouth daily.     . insulin glargine (LANTUS) 100 UNIT/ML injection Inject 85 Units into the skin 2 (two) times daily as needed (takes 85 units every night at bedtime 2 nd dose only if needed for elevated blood sugars).     . metFORMIN (GLUCOPHAGE) 500 MG tablet Take 500 mg by mouth 2 (two) times daily with a meal.    . metoprolol (TOPROL-XL) 200 MG 24 hr tablet Take 200 mg by mouth daily after breakfast.     . Misc Natural Products (OSTEO BI-FLEX ADV TRIPLE ST) TABS Take 500 each by mouth daily.     . nitroGLYCERIN (NITROSTAT) 0.4 MG SL tablet Place 0.4 mg under the tongue every 5 (five) minutes as needed for chest pain.    . pravastatin (PRAVACHOL) 80 MG tablet Take 80 mg by mouth every evening.     . sacubitril-valsartan (ENTRESTO) 49-51 MG Take 1 tablet by mouth 2 (two) times daily. 60 tablet 11  . sitaGLIPtin (JANUVIA) 50 MG tablet Take 50 mg by mouth daily.    Marland Kitchen spironolactone (ALDACTONE) 25 MG tablet Take 25 mg by mouth daily.  2  . VIAGRA 100 MG tablet Take 50 mg by mouth as needed.   3  . warfarin (COUMADIN) 5 MG tablet Take 5 mg by mouth daily. 1 tablet daily     No current facility-administered medications for this visit.      Allergies:  Allergies  Allergen Reactions  . Doxycycline Hives    Past Medical History, Surgical history, Social history, and Family History reviewed today without any changes.    Physical Exam: Blood pressure (!) 134/56, pulse 90, temperature 98.1 F (36.7 C), temperature source Oral, resp. rate 18, height 5\' 9"  (1.753 m), weight 226 lb 12.8 oz (102.9 kg), SpO2 100 %.   ECOG: 1 General appearance: Alert, awake gentleman without distress. Head: Atraumatic without abnormalities. Oropharynx: Without any thrush or ulcers. Eyes: No scleral icterus. Lymph nodes: No cervical, supraclavicular, and axillary nodes abnormalities. Heart: Irregular with metallic click  noted. Lung: Clear to auscultation without any rhonchi, wheezes or dullness to percussion. Abdomen: Soft, nontender without any rebound or guarding. Musculoskeletal: No joint deformity or effusion. Skin: No rashes or lesions. Neurological: No motor or sensory deficits.   Lab Results: Lab Results  Component Value Date   WBC 6.8 07/09/2017   HGB 13.1 07/09/2017   HCT 39.8 07/09/2017   MCV 91.4 07/09/2017   PLT 187 07/09/2017     Chemistry      Component Value Date/Time   NA 145 (H) 12/24/2017 0819   NA 141 07/09/2017 0818   K 4.6 12/24/2017 0819   K 4.9 07/09/2017 0818   CL 108 (H) 12/24/2017 0819   CL 106 01/21/2013 1050   CO2 23 12/24/2017 0819   CO2 21 (L) 07/09/2017 0818   BUN 36 (H) 12/24/2017 0819   BUN 44.9 (H) 07/09/2017 0818   CREATININE 1.29 (H) 12/24/2017 0819   CREATININE 1.4 (H) 07/09/2017 0818      Component Value Date/Time   CALCIUM 9.2 12/24/2017 0819   CALCIUM 9.1 07/09/2017 0818   ALKPHOS 117 12/24/2017 0819   ALKPHOS 104 07/09/2017 0818   AST 20 12/24/2017 0819   AST 22 07/09/2017 0818   ALT 15 12/24/2017 0819   ALT 19 07/09/2017 0818   BILITOT 0.4 12/24/2017 0819   BILITOT 0.50 07/09/2017 0818       Impression and Plan:  67 year old man with:   1.Diffuse large cell lymphoma diagnosed in October 2014 with stage IIA.  He presented with abdominal mass that was biopsy-proven to be diffuse large cell lymphoma.  He is status post definitive chemotherapy and achieved a complete response in 2014.  He remains disease-free up to this point.  Last imaging studies in 2016 showed no evidence of recurrence.  The natural course of this disease was reviewed today and the likelihood of disease relapse is low at this time.  The plan is to continue with clinical surveillance for another 6 months and annual after that.  2. Anemia: Related to his cancer and cancer treatment at the time of diagnosis.  His anemia has resolved.  3. Chronic renal insufficiency:  Creatinine remains stable at this time.  4.  Colon cancer screening: He is up-to-date at this time.  5. Followup: Will be in 6 months.   15  minutes was spent with the patient face-to-face today.  More than 50% of time was dedicated to patient counseling, education and coordination of his care.    Zola Button 4/30/20198:03 AM

## 2018-01-07 NOTE — Telephone Encounter (Signed)
Printed avs and calender of upcoming appointment. Per 4/30 los 

## 2018-01-08 ENCOUNTER — Other Ambulatory Visit: Payer: Self-pay | Admitting: Family Medicine

## 2018-02-04 ENCOUNTER — Encounter: Payer: Self-pay | Admitting: Family Medicine

## 2018-02-04 ENCOUNTER — Ambulatory Visit (INDEPENDENT_AMBULATORY_CARE_PROVIDER_SITE_OTHER): Payer: PPO | Admitting: Family Medicine

## 2018-02-04 VITALS — BP 135/73 | HR 73 | Temp 97.0°F | Ht 69.0 in | Wt 219.8 lb

## 2018-02-04 DIAGNOSIS — Z86010 Personal history of colonic polyps: Secondary | ICD-10-CM

## 2018-02-04 DIAGNOSIS — Z952 Presence of prosthetic heart valve: Secondary | ICD-10-CM | POA: Diagnosis not present

## 2018-02-04 DIAGNOSIS — Z7901 Long term (current) use of anticoagulants: Secondary | ICD-10-CM

## 2018-02-04 LAB — COAGUCHEK XS/INR WAIVED
INR: 3.9 — ABNORMAL HIGH (ref 0.9–1.1)
Prothrombin Time: 46.4 s

## 2018-02-04 MED ORDER — GLIPIZIDE ER 10 MG PO TB24: 10.0000 mg | ORAL_TABLET | Freq: Every day | ORAL | 3 refills | Status: DC

## 2018-02-04 NOTE — Progress Notes (Signed)
   HPI  Patient presents today here for INR check.  Patient also needs referral to GI for colonic polyps.  At this time for his repeat surveillance colonoscopy.  Patient also needs refill of Glucotrol.  He denies any diet changes, bleeding, or complications with Coumadin. He usually bridges with Lovenox whenever he has a colonoscopy and is anticipating that change. He takes 1 pill once daily of Coumadin  PMH: Smoking status noted ROS: Per HPI  Objective: BP 135/73   Pulse 73   Temp (!) 97 F (36.1 C) (Oral)   Ht 5\' 9"  (1.753 m)   Wt 219 lb 12.8 oz (99.7 kg)   BMI 32.46 kg/m  Gen: NAD, alert, cooperative with exam HEENT: NCAT CV: RRR, mitral click Resp: CTABL, no wheezes, non-labored Ext: No edema, warm Neuro: Alert and oriented, No gross deficits  Assessment and plan:  #Long-term use of anticoagulants, history of mitral valve replacement INR goal 2.5-3.5 INR is 3.9 today, slightly supratherapeutic, reduce weekly dose from 35 mg to 32.5 mg  #History of polyps Refer to GI    Laroy Apple, MD [facility] 02/04/2018, 8:13 AM

## 2018-02-20 LAB — HM DIABETES EYE EXAM

## 2018-03-04 ENCOUNTER — Encounter: Payer: Self-pay | Admitting: Family Medicine

## 2018-03-04 ENCOUNTER — Ambulatory Visit (INDEPENDENT_AMBULATORY_CARE_PROVIDER_SITE_OTHER): Payer: PPO | Admitting: Family Medicine

## 2018-03-04 VITALS — BP 135/75 | HR 87 | Temp 96.8°F | Ht 69.0 in | Wt 221.0 lb

## 2018-03-04 DIAGNOSIS — Z7901 Long term (current) use of anticoagulants: Secondary | ICD-10-CM

## 2018-03-04 LAB — COAGUCHEK XS/INR WAIVED
INR: 2.5 — ABNORMAL HIGH (ref 0.9–1.1)
Prothrombin Time: 30.2 s

## 2018-03-04 NOTE — Patient Instructions (Signed)
Great to see you!  No changes to coumadin regimen, continue 1 pill daily except for 1/2 on Monday.

## 2018-03-04 NOTE — Progress Notes (Signed)
   HPI  Patient presents today for INR check.  Patient denies any bleeding, change in diet, or missed doses.  PMH: Smoking status noted ROS: Per HPI  Objective: BP 135/75   Pulse 87   Temp (!) 96.8 F (36 C) (Oral)   Ht 5\' 9"  (1.753 m)   Wt 221 lb (100.2 kg)   BMI 32.64 kg/m  Gen: NAD, alert, cooperative with exam HEENT: NCAT CV: RRR, good S1/S2, no murmur Resp: CTABL, no wheezes, non-labored Ext: No edema, warm Neuro: Alert and oriented, No gross deficits  Assessment and plan:  #Chronic anticoagulation A. fib with mitral valve replacement, INR goal 2.5-3.5 INR is 2.5 today, no changes, weekly dose is currently 32.5 mg Patient has scheduled follow-up on 7/18   Orders Placed This Encounter  Procedures  . CoaguChek XS/INR Carney Bern, MD [facility] 03/04/2018, 8:13 AM

## 2018-03-07 ENCOUNTER — Other Ambulatory Visit: Payer: Self-pay | Admitting: Family Medicine

## 2018-03-07 ENCOUNTER — Telehealth: Payer: Self-pay | Admitting: Family Medicine

## 2018-03-07 MED ORDER — ENOXAPARIN SODIUM 100 MG/ML ~~LOC~~ SOLN: 100.0000 mg | SUBCUTANEOUS | 0 refills | Status: DC

## 2018-03-07 MED ORDER — ENOXAPARIN SODIUM 100 MG/ML ~~LOC~~ SOLN: 100.0000 mg | Freq: Two times a day (BID) | SUBCUTANEOUS | 0 refills | Status: DC

## 2018-03-07 NOTE — Telephone Encounter (Signed)
Pt states that he is rtn [place] call he is going to [facility] and may stop by here and talk to her

## 2018-03-07 NOTE — Progress Notes (Signed)
Will ask nursing to review bridging with patient.   Patient is anticoagulated for history of mitral valve plus atrial fibrillation.  Recommend Lovenox bridging for holding Coumadin before colonoscopy.  Stop Coumadin 4 days prior to procedure, begin bridging with Lovenox injections the following day.  Hold Lovenox one day before procedure.  Restart Lovenox plus Coumadin at usual dose the following day after procedure and continue Lovenox daily until INR is therapeutic.  Recheck INR 4 to 6 days after procedure.  Laroy Apple, MD [facility] 03/07/2018, 8:19 AM

## 2018-03-07 NOTE — Progress Notes (Signed)
Patient aware and verbalizes understanding. 

## 2018-03-07 NOTE — Telephone Encounter (Signed)
Refer to phone note

## 2018-03-07 NOTE — Progress Notes (Signed)
lmtcb

## 2018-03-19 ENCOUNTER — Ambulatory Visit: Payer: PPO | Admitting: *Deleted

## 2018-03-21 ENCOUNTER — Other Ambulatory Visit: Payer: Self-pay | Admitting: *Deleted

## 2018-03-21 MED ORDER — METOPROLOL SUCCINATE ER 200 MG PO TB24: 200.0000 mg | ORAL_TABLET | Freq: Every day | ORAL | 3 refills | Status: DC

## 2018-03-21 NOTE — Telephone Encounter (Signed)
High dose historical med, will ask nursing to confirm with pt he actually takes.   Laroy Apple, MD [facility] 03/21/2018, 2:59 PM

## 2018-03-21 NOTE — Telephone Encounter (Signed)
Patient states he still is taking it- rx sent.

## 2018-03-21 NOTE — Telephone Encounter (Signed)
lmtcb

## 2018-03-27 ENCOUNTER — Ambulatory Visit (INDEPENDENT_AMBULATORY_CARE_PROVIDER_SITE_OTHER): Payer: PPO | Admitting: Family Medicine

## 2018-03-27 ENCOUNTER — Encounter: Payer: Self-pay | Admitting: Family Medicine

## 2018-03-27 VITALS — BP 139/54 | HR 74 | Temp 96.9°F | Ht 69.0 in | Wt 216.0 lb

## 2018-03-27 DIAGNOSIS — Z794 Long term (current) use of insulin: Secondary | ICD-10-CM | POA: Diagnosis not present

## 2018-03-27 DIAGNOSIS — E119 Type 2 diabetes mellitus without complications: Secondary | ICD-10-CM

## 2018-03-27 LAB — BMP8+EGFR
BUN/Creatinine Ratio: 31 — ABNORMAL HIGH (ref 10–24)
BUN: 40 mg/dL — ABNORMAL HIGH (ref 8–27)
CO2: 22 mmol/L (ref 20–29)
Calcium: 9.1 mg/dL (ref 8.6–10.2)
Chloride: 105 mmol/L (ref 96–106)
Creatinine, Ser: 1.3 mg/dL — ABNORMAL HIGH (ref 0.76–1.27)
GFR calc Af Amer: 67 mL/min/{1.73_m2} (ref >59)
GFR calc non Af Amer: 58 mL/min/{1.73_m2} — ABNORMAL LOW (ref >59)
Glucose: 93 mg/dL (ref 65–99)
Potassium: 5.3 mmol/L — ABNORMAL HIGH (ref 3.5–5.2)
Sodium: 144 mmol/L (ref 134–144)

## 2018-03-27 LAB — BAYER DCA HB A1C WAIVED: HB A1C (BAYER DCA - WAIVED): 6.1 % (ref <7.0)

## 2018-03-27 NOTE — Patient Instructions (Signed)
Great to see you!   

## 2018-03-27 NOTE — Progress Notes (Signed)
   HPI  Patient presents today for a low up chronic medical conditions.  Type 2 diabetes Good medication compliance but has worked himself down to 1 injection of Lantus daily.  Taking about 80 units a day. No hypoglycemia He was having blood sugars in the 60s which is why he began decreasing his doses.  He is now watching his diet better than he was previously.  Patient understands his Coumadin and Lovenox bridging clearly.  Previously we mistakenly told him to do one injection of Lovenox daily, he brought this back to [place] he is actually doing 1 mg/kg twice daily, not 1.5 mg/kg daily.  PMH: Smoking status noted ROS: Per HPI  Objective: BP (!) 139/54   Pulse 74   Temp (!) 96.9 F (36.1 C) (Oral)   Ht '5\' 9"'$  (1.753 m)   Wt 216 lb (98 kg)   BMI 31.90 kg/m  Gen: NAD, alert, cooperative with exam HEENT: NCAT CV: RRR, good S1/S2, no murmur Resp: CTABL, no wheezes, non-labored Ext: No edema, warm Neuro: Alert and oriented, No gross deficits  Assessment and plan:  #Type 2 diabetes Expect good control BMP Continue current dosing, no changes  Orders Placed This Encounter  Procedures  . Bayer DCA Hb A1c Waived  . [place], MD [facility] 03/27/2018, 8:08 AM

## 2018-03-28 ENCOUNTER — Other Ambulatory Visit: Payer: Self-pay | Admitting: *Deleted

## 2018-03-28 DIAGNOSIS — E875 Hyperkalemia: Secondary | ICD-10-CM

## 2018-03-31 ENCOUNTER — Other Ambulatory Visit: Payer: Self-pay | Admitting: Family Medicine

## 2018-03-31 NOTE — Telephone Encounter (Signed)
Ov 04/25/18

## 2018-04-11 DIAGNOSIS — D126 Benign neoplasm of colon, unspecified: Secondary | ICD-10-CM | POA: Diagnosis not present

## 2018-04-11 DIAGNOSIS — Z86010 Personal history of colonic polyps: Secondary | ICD-10-CM | POA: Diagnosis not present

## 2018-04-15 DIAGNOSIS — D126 Benign neoplasm of colon, unspecified: Secondary | ICD-10-CM | POA: Diagnosis not present

## 2018-04-17 ENCOUNTER — Encounter: Payer: Self-pay | Admitting: Family Medicine

## 2018-04-17 ENCOUNTER — Ambulatory Visit (INDEPENDENT_AMBULATORY_CARE_PROVIDER_SITE_OTHER): Payer: PPO | Admitting: Family Medicine

## 2018-04-17 VITALS — BP 122/63 | HR 85 | Temp 96.9°F | Ht 69.0 in | Wt 216.0 lb

## 2018-04-17 DIAGNOSIS — Z7901 Long term (current) use of anticoagulants: Secondary | ICD-10-CM | POA: Diagnosis not present

## 2018-04-17 DIAGNOSIS — Z952 Presence of prosthetic heart valve: Secondary | ICD-10-CM | POA: Diagnosis not present

## 2018-04-17 LAB — COAGUCHEK XS/INR WAIVED
INR: 1.8 — ABNORMAL HIGH (ref 0.9–1.1)
Prothrombin Time: 21 s

## 2018-04-17 MED ORDER — WARFARIN SODIUM 1 MG PO TABS: 1.0000 mg | ORAL_TABLET | Freq: Every day | ORAL | 0 refills | Status: DC

## 2018-04-17 NOTE — Progress Notes (Signed)
Subjective:  Patient ID: John [name], male    DOB: 06-09-1951  Age: 67 y.o. MRN: [id number]  CC: Follow-up (pt here today for INR after having colonoscopy and is doing Lovenox injections 100mg  BID)   HPI John [name] presents for evaluation of his anticoagulation.  He has been using the Lovenox shots since his colonoscopy and before that.  Once he gets to an INR of 2.5 he wants to stop the Lovenox.  He has resumed his usual dose of Coumadin at 5 mg daily with the exception of 2.5 mg each Monday.  He has been taking the Coumadin now for 6 days.  INR reported as 1.  It has to be 2.5-3.5 due to his artificial mechanical mitral valve.  He is frustrated by the bruising he is getting on his abdomen with each shot.  Depression screen [facility] 2/9 04/17/2018 03/27/2018 03/04/2018  Decreased Interest 0 0 0  Down, Depressed, Hopeless 0 0 0  PHQ - 2 Score 0 0 0    History John [name] has a past medical history of Allergy, Anemia, Arthritis, Atrial fibrillation ([place]), Bruises easily, CAD (coronary artery disease), CHF (congestive heart failure) ([place]), Constipation, Enlarged prostate, Flu (09/2013), GERD (gastroesophageal reflux disease), Glaucoma, History of blood transfusion, History of colon polyps, HLD (hyperlipidemia), HTN (hypertension), Insomnia, Kidney stones, Nocturia, Peripheral edema, Peripheral neuropathy, Rheumatic fever (~ 1965), Rheumatic heart disease, S/P MVR (mitral valve replacement) (11/2000), 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   He has a past surgical history that includes Mitral valve replacement (11/2000); laparotomy (N/A, 02/09/2013); Portacath placement (N/A, 02/09/2013); Coronary artery bypass graft (2002); Septoplasty; Application if wound vac (2014); Colonoscopy; Esophagogastroduodenoscopy; Laparoscopic incisional / umbilical / ventral hernia repair (03/15/2014); Port-a-cath removal (03/15/2014); Hernia repair; Cardiac catheterization  (2002); Incisional hernia repair (N/A, 03/15/2014); Port-a-cath removal (Left, 03/15/2014); Insertion of mesh (N/A, 03/15/2014); RIGHT/LEFT HEART CATH AND CORONARY/GRAFT ANGIOGRAPHY (N/A, 05/10/2017); Coronary artery bypass graft; and Small intestine surgery.   His family history includes Arthritis in his brother; Depression in his brother; Diabetes in his brother, brother, and mother; Heart disease in his brother and brother; Hyperlipidemia in his brother and brother; Hypertension in his brother, brother, and father; Thyroid disease in his sister.He reports that he has quit smoking. His smoking use included cigars. He quit after 40.00 years of use. He has never used smokeless tobacco. He reports that he does not drink alcohol or use drugs.    ROS Review of Systems  Constitutional: Negative for fever.  Respiratory: Negative for shortness of breath.   Cardiovascular: Negative for chest pain.  Musculoskeletal: Negative for arthralgias.  Skin: Negative for rash.  Hematological: Negative for adenopathy. Bruises/bleeds easily.    Objective:  BP 122/63   Pulse 85   Temp (!) 96.9 F (36.1 C) (Oral)   Ht 5\' 9"  (1.753 m)   Wt 216 lb (98 kg)   BMI 31.90 kg/m   BP Readings from Last 3 Encounters:  04/17/18 122/63  03/27/18 (!) 139/54  03/04/18 135/75    Wt Readings from Last 3 Encounters:  04/17/18 216 lb (98 kg)  03/27/18 216 lb (98 kg)  03/04/18 221 lb (100.2 kg)     Physical Exam  Constitutional: He is oriented to person, place, and time. He appears well-developed and well-nourished.  HENT:  Head: Normocephalic and atraumatic.  Right Ear: Tympanic membrane and external ear normal. No decreased hearing is noted.  Left Ear: Tympanic membrane and external ear  normal. No decreased hearing is noted.  Mouth/Throat: No oropharyngeal exudate or posterior oropharyngeal erythema.  Eyes: Pupils are equal, round, and reactive to light.  Neck: Normal range of motion. Neck supple.  Cardiovascular:  Normal rate and regular rhythm.  No murmur heard. Pulmonary/Chest: Breath sounds normal. No respiratory distress.  Abdominal: Soft. There is no tenderness.  Neurological: He is alert and oriented to person, place, and time.  Skin:  Abdomen reveals skin with multiple small bruises with central nidus indicative of having received a Lovenox shot.  Vitals reviewed.     Assessment & Plan:   John [name] was seen today for follow-up.  Diagnoses and all orders for this visit:  Long term (current) use of anticoagulants [Z79.01] -     CoaguChek XS/INR Waived  H/O mitral valve replacement  Other orders -     warfarin (COUMADIN) 1 MG tablet; Take 1 tablet (1 mg total) by mouth daily.       I am having John [name] start on warfarin. I am also having him maintain his OSTEO BI-FLEX ADV TRIPLE ST, warfarin, nitroGLYCERIN, aspirin, insulin glargine, sitaGLIPtin, dexlansoprazole, sacubitril-valsartan, spironolactone, VIAGRA, furosemide, metFORMIN, glipiZIDE, enoxaparin, metoprolol, and pravastatin.  Allergies as of 04/17/2018      Reactions   Doxycycline Hives      Medication List        Accurate as of 04/17/18 12:36 PM. Always use your most recent med list.          aspirin 81 MG chewable tablet Chew 81 mg by mouth daily.   DEXILANT 60 MG capsule Generic drug:  dexlansoprazole Take 60 mg by mouth daily.   enoxaparin 100 MG/ML injection Commonly known as:  LOVENOX Inject 1 mL (100 mg total) into the skin daily.   furosemide 40 MG tablet Commonly known as:  LASIX Take 1 tablet (40 mg total) by mouth daily.   glipiZIDE 10 MG 24 hr tablet Commonly known as:  GLUCOTROL XL Take 1 tablet (10 mg total) by mouth daily.   insulin glargine 100 UNIT/ML injection Commonly known as:  LANTUS Inject 85 Units into the skin 2 (two) times daily as needed (takes 85 units every night at bedtime 2 nd dose only if needed for elevated blood sugars).   metFORMIN 500 MG tablet Commonly known  as:  GLUCOPHAGE Take 500 mg by mouth 2 (two) times daily with a meal.   metoprolol 200 MG 24 hr tablet Commonly known as:  TOPROL-XL Take 1 tablet (200 mg total) by mouth daily after breakfast.   nitroGLYCERIN 0.4 MG SL tablet Commonly known as:  NITROSTAT Place 0.4 mg under the tongue every 5 (five) minutes as needed for chest pain.   OSTEO BI-FLEX ADV TRIPLE ST Tabs Take 500 each by mouth daily.   pravastatin 80 MG tablet Commonly known as:  PRAVACHOL TAKE 1 TABLET BY MOUTH DAILY EVERY EVENING   sacubitril-valsartan 49-51 MG Commonly known as:  ENTRESTO Take 1 tablet by mouth 2 (two) times daily.   sitaGLIPtin 50 MG tablet Commonly known as:  JANUVIA Take 50 mg by mouth daily.   spironolactone 25 MG tablet Commonly known as:  ALDACTONE Take 25 mg by mouth daily.   VIAGRA 100 MG tablet Generic drug:  sildenafil Take 50 mg by mouth as needed.   warfarin 1 MG tablet Commonly known as:  COUMADIN Take as directed by the anticoagulation clinic. If you are unsure how to take this medication, talk to your nurse or doctor. Original instructions:  Take 1 tablet (1 mg total) by mouth daily.   warfarin 5 MG tablet Commonly known as:  COUMADIN Take as directed by the anticoagulation clinic. If you are unsure how to take this medication, talk to your nurse or doctor. Original instructions:  Take 5 mg by mouth daily. 1 tablet daily        Follow-up: Return in about 4 days (around 04/21/2018) for Anticoagulation.  Claretta Fraise, M.D.

## 2018-04-21 ENCOUNTER — Encounter: Payer: Self-pay | Admitting: Family Medicine

## 2018-04-21 ENCOUNTER — Ambulatory Visit (INDEPENDENT_AMBULATORY_CARE_PROVIDER_SITE_OTHER): Payer: PPO | Admitting: Family Medicine

## 2018-04-21 VITALS — BP 134/65 | HR 84 | Ht 69.0 in | Wt 216.5 lb

## 2018-04-21 DIAGNOSIS — Z7901 Long term (current) use of anticoagulants: Secondary | ICD-10-CM

## 2018-04-21 MED ORDER — PRAVASTATIN SODIUM 80 MG PO TABS: ORAL_TABLET | ORAL | 3 refills | Status: DC

## 2018-04-21 NOTE — Progress Notes (Signed)
Chief Complaint  Patient presents with  . Follow-up    pt here today for INR    HPI  Patient presents today for Recheck of INR. Was low at last OV & coumadin dose increased. Has continued lovenox BID due to mechanical heart valve. Denies Chest pain, dyspnea. No blod from any orifice. Has a lot of bruises noted at injection sites.   PMH: Smoking status noted ROS: Per HPI  Objective: BP 134/65   Pulse 84   Ht 5\' 9"  (1.753 m)   Wt 216 lb 8 oz (98.2 kg)   BMI 31.97 kg/m  Gen: NAD, alert, cooperative with exam HEENT: NCAT, EOMI, PERRL CV: RRR, good S1/S2, Resp: CTABL, no wheezes, non-labored Ext: No edema, warm MUltiple bruises at injection sites on abd wall in various stages of healing. Neuro: Alert and oriented, No gross deficits  Assessment and plan:  1. Long term (current) use of anticoagulants [Z79.01]     No orders of the defined types were placed in this encounter.   Orders Placed This Encounter  Procedures  . CoaguChek XS/INR Waived    Follow up 4 days. Recheck INR so that steady state can be verified. Can DC lovenox now.  Claretta Fraise, MD

## 2018-04-22 LAB — COAGUCHEK XS/INR WAIVED
INR: 2.6 — ABNORMAL HIGH (ref 0.9–1.1)
Prothrombin Time: 30.9 s

## 2018-04-25 ENCOUNTER — Encounter: Payer: Self-pay | Admitting: Family Medicine

## 2018-04-25 ENCOUNTER — Ambulatory Visit (INDEPENDENT_AMBULATORY_CARE_PROVIDER_SITE_OTHER): Payer: PPO | Admitting: Family Medicine

## 2018-04-25 VITALS — BP 123/52 | HR 89 | Temp 97.5°F | Ht 69.0 in | Wt 212.2 lb

## 2018-04-25 DIAGNOSIS — Z9889 Other specified postprocedural states: Secondary | ICD-10-CM

## 2018-04-25 DIAGNOSIS — E875 Hyperkalemia: Secondary | ICD-10-CM

## 2018-04-25 DIAGNOSIS — Z952 Presence of prosthetic heart valve: Secondary | ICD-10-CM

## 2018-04-25 DIAGNOSIS — Z7901 Long term (current) use of anticoagulants: Secondary | ICD-10-CM

## 2018-04-25 LAB — CMP14+EGFR
ALT: 14 IU/L (ref 0–44)
AST: 16 IU/L (ref 0–40)
Albumin/Globulin Ratio: 1.5 (ref 1.2–2.2)
Albumin: 4.1 g/dL (ref 3.6–4.8)
Alkaline Phosphatase: 118 IU/L — ABNORMAL HIGH (ref 39–117)
BUN/Creatinine Ratio: 36 — ABNORMAL HIGH (ref 10–24)
BUN: 47 mg/dL — ABNORMAL HIGH (ref 8–27)
Bilirubin Total: 0.5 mg/dL (ref 0.0–1.2)
CO2: 20 mmol/L (ref 20–29)
Calcium: 9.4 mg/dL (ref 8.6–10.2)
Chloride: 102 mmol/L (ref 96–106)
Creatinine, Ser: 1.31 mg/dL — ABNORMAL HIGH (ref 0.76–1.27)
GFR calc Af Amer: 66 mL/min/{1.73_m2} (ref >59)
GFR calc non Af Amer: 57 mL/min/{1.73_m2} — ABNORMAL LOW (ref >59)
Globulin, Total: 2.7 g/dL (ref 1.5–4.5)
Glucose: 229 mg/dL — ABNORMAL HIGH (ref 65–99)
Potassium: 4.7 mmol/L (ref 3.5–5.2)
Sodium: 138 mmol/L (ref 134–144)
Total Protein: 6.8 g/dL (ref 6.0–8.5)

## 2018-04-25 LAB — COAGUCHEK XS/INR WAIVED
INR: 2.6 — ABNORMAL HIGH (ref 0.9–1.1)
Prothrombin Time: 30.8 s

## 2018-04-25 LAB — POCT INR: INR: 2.6 (ref 2–3)

## 2018-04-25 MED ORDER — WARFARIN SODIUM 5 MG PO TABS: 5.0000 mg | ORAL_TABLET | Freq: Every day | ORAL | 0 refills | Status: DC

## 2018-04-25 NOTE — Progress Notes (Signed)
Chief Complaint  Patient presents with  . Follow-up    pt here today for Protime    HPI  Patient presents today for recheck of his pro time.  He takes anticoagulation for his artificial valve.  Patient is concerned that he has an elevated potassium on most recent blood work.  This was ostensibly because of the use of Entresto. he would like to have that rechecked today as well.  He has a little bit of swelling but otherwise denies congestive failure symptoms such as shortness of breath and excessive fatigue.  He has not had any bleeding since he stopped the Lovenox 4 days ago. PMH: Smoking status noted ROS: Per HPI  Objective: BP (!) 123/52   Pulse 89   Temp (!) 97.5 F (36.4 C) (Oral)   Ht '5\' 9"'$  (1.753 m)   Wt 212 lb 4 oz (96.3 kg)   BMI 31.34 kg/m  Gen: NAD, alert, cooperative with exam HEENT: NCAT, EOMI, PERRL CV: RRR, good S1/S2, no murmur Resp: CTABL, no wheezes, non-labored Abd: SNTND, BS present, no guarding or organomegaly Ext: No edema, warm Neuro: Alert and oriented, No gross deficits  Assessment and plan:  1. Long term (current) use of anticoagulants [Z79.01]   2. MITRAL VALVE REPLACEMENT, HX OF   3. H/O mitral valve replacement   4. Hyperkalemia     Meds ordered this encounter  Medications  . warfarin (COUMADIN) 5 MG tablet    Sig: Take 1 tablet (5 mg total) by mouth daily. 1 tablet daily    Dispense:  90 tablet    Refill:  0    Orders Placed This Encounter  Procedures  . CoaguChek XS/INR Waived  . CMP14+EGFR  . POCT INR    This back office order was created through the Results Console.  Marland Kitchen POCT INR    This order was created through the anticoagulation tracking navigator section.    Follow up 2 weeks  Claretta Fraise, MD

## 2018-05-07 ENCOUNTER — Ambulatory Visit: Payer: PPO | Admitting: Family Medicine

## 2018-05-09 ENCOUNTER — Other Ambulatory Visit: Payer: Self-pay | Admitting: Family Medicine

## 2018-05-09 ENCOUNTER — Ambulatory Visit: Payer: PPO | Admitting: Family Medicine

## 2018-05-09 VITALS — BP 103/62 | HR 78 | Temp 96.2°F | Ht 69.0 in | Wt 211.0 lb

## 2018-05-09 DIAGNOSIS — Z952 Presence of prosthetic heart valve: Secondary | ICD-10-CM

## 2018-05-09 DIAGNOSIS — Z7901 Long term (current) use of anticoagulants: Secondary | ICD-10-CM

## 2018-05-09 DIAGNOSIS — I482 Chronic atrial fibrillation, unspecified: Secondary | ICD-10-CM

## 2018-05-09 LAB — PROTIME-INR: INR: 2.6 — AB (ref <=1.1)

## 2018-05-09 LAB — COAGUCHEK XS/INR WAIVED
INR: 2.6 — ABNORMAL HIGH (ref 0.9–1.1)
Prothrombin Time: 31.3 s

## 2018-05-12 NOTE — Progress Notes (Signed)
No chief complaint on file.   HPI  Patient presents today for atrial fibrillation  Patient in for follow-up of atrial fibrillation.  He is also had mitral valve replacement and takes Coumadin.  Patient denies any recent bouts of chest pain or palpitations. Patient denies any recent excessive bleeding episodes including epistaxis, bleeding from the gums, genitalia, rectal bleeding or hematuria. Additionally there has been no excessive bruising.  PMH: Smoking status noted ROS: Per HPI  Objective: BP 103/62 (BP Location: Left Arm)   Pulse 78   Temp (!) 96.2 F (35.7 C) (Oral)   Ht 5\' 9"  (1.753 m)   Wt 211 lb (95.7 kg)   BMI 31.16 kg/m  Gen: NAD, alert, cooperative with exam HEENT: NCAT, EOMI, PERRL CV: RRR, good S1/S2, no murmur Resp: CTABL, no wheezes, non-labored Abd: SNTND, BS present, no guarding or organomegaly Ext: No edema, warm Neuro: Alert and oriented, No gross deficits  Assessment and plan:  1. Long term (current) use of anticoagulants [Z79.01]   2. Chronic atrial fibrillation (HCC)   3. H/O mitral valve replacement      Continue coumadin at current dose.  Orders Placed This Encounter  Procedures  . CoaguChek XS/INR Waived    Follow up 1 month Claretta Fraise, MD

## 2018-05-22 ENCOUNTER — Ambulatory Visit: Payer: PPO | Admitting: *Deleted

## 2018-06-03 ENCOUNTER — Other Ambulatory Visit (HOSPITAL_COMMUNITY): Payer: PPO

## 2018-06-09 ENCOUNTER — Encounter: Payer: Self-pay | Admitting: Family Medicine

## 2018-06-09 ENCOUNTER — Ambulatory Visit (INDEPENDENT_AMBULATORY_CARE_PROVIDER_SITE_OTHER): Payer: PPO | Admitting: Family Medicine

## 2018-06-09 VITALS — BP 135/71 | HR 78 | Temp 97.1°F | Ht 69.0 in | Wt 211.0 lb

## 2018-06-09 DIAGNOSIS — I482 Chronic atrial fibrillation, unspecified: Secondary | ICD-10-CM

## 2018-06-09 DIAGNOSIS — I10 Essential (primary) hypertension: Secondary | ICD-10-CM | POA: Diagnosis not present

## 2018-06-09 DIAGNOSIS — Z9889 Other specified postprocedural states: Secondary | ICD-10-CM

## 2018-06-09 DIAGNOSIS — Z7901 Long term (current) use of anticoagulants: Secondary | ICD-10-CM | POA: Diagnosis not present

## 2018-06-09 DIAGNOSIS — Z952 Presence of prosthetic heart valve: Secondary | ICD-10-CM

## 2018-06-09 DIAGNOSIS — Z23 Encounter for immunization: Secondary | ICD-10-CM

## 2018-06-09 LAB — PROTIME-INR: INR: 3.3 — AB (ref 0.9–1.1)

## 2018-06-09 LAB — COAGUCHEK XS/INR WAIVED
INR: 3.3 — ABNORMAL HIGH (ref 0.9–1.1)
Prothrombin Time: 39.6 s

## 2018-06-09 MED ORDER — WARFARIN SODIUM 1 MG PO TABS: 1.0000 mg | ORAL_TABLET | Freq: Every day | ORAL | 1 refills | Status: DC

## 2018-06-09 MED ORDER — FUROSEMIDE 40 MG PO TABS: 40.0000 mg | ORAL_TABLET | Freq: Every day | ORAL | 1 refills | Status: DC

## 2018-06-09 NOTE — Progress Notes (Signed)
Chief Complaint  Patient presents with  . Coagulation Disorder    HPI  Patient presents today for follow-up of atrial fibrillation. Patient denies any recent bouts of chest pain or palpitations. Additionally, patient is taking anticoagulants. Patient denies any recent excessive bleeding episodes including epistaxis, bleeding from the gums, genitalia, rectal bleeding or hematuria. Additionally there has been no excessive bruising.  He is also had a replacement of his mitral valve.  PMH: Smoking status noted ROS: Per HPI  Objective: BP 135/71   Pulse 78   Temp (!) 97.1 F (36.2 C) (Oral)   Ht 5\' 9"  (1.753 m)   Wt 211 lb (95.7 kg)   BMI 31.16 kg/m  Gen: NAD, alert, cooperative with exam HEENT: NCAT, EOMI, PERRL CV: RRR, good S1/S2, 2/6 murmur Resp: CTABL, no wheezes, non-labored Abd: SNTND, BS present, no guarding or organomegaly Ext: No edema, warm Neuro: Alert and oriented, No gross deficits  Assessment and plan:  1. Chronic atrial fibrillation   2. Long term (current) use of anticoagulants   3. H/O mitral valve replacement   4. Encounter for immunization   5. Essential hypertension   6. MITRAL VALVE REPLACEMENT, HX OF     Meds ordered this encounter  Medications  . DISCONTD: warfarin (COUMADIN) 1 MG tablet    Sig: Take 1 tablet (1 mg total) by mouth daily.    Dispense:  90 tablet    Refill:  1  . furosemide (LASIX) 40 MG tablet    Sig: Take 1 tablet (40 mg total) by mouth daily.    Dispense:  90 tablet    Refill:  1  . warfarin (COUMADIN) 1 MG tablet    Sig: Take 1 tablet (1 mg total) by mouth daily.    Dispense:  90 tablet    Refill:  1    Orders Placed This Encounter  Procedures  . Flu vaccine HIGH DOSE PF  . Protime-INR    This back office order was created through the Results Console.  . Protime-INR  . CoaguChek XS/INR Waived    Follow up 1 month Claretta Fraise, MD

## 2018-06-09 NOTE — Patient Instructions (Signed)
Prothrombin Time, International Normalized Ratio Test Why am I having this test? A prothrombin time (Pro-Time, PT) test measures how many seconds it takes your blood to clot. The international normalized ratio (INR) is a calculation of blood clotting time based on your PT result. Most labs report both PT and INR values when reporting blood clotting times. Your health care provider may want you to have this test done if:  You have certain medical conditions that cause abnormal bleeding or blood clotting. These can include: ? Liver disease. ? Systemic infection (sepsis). ? Inherited (genetic) bleeding disorders.  You are taking a medicine to prevent excessive blood clotting (anticoagulant), such as warfarin. ? If you are taking warfarin, you will likely be asked to have this test done at regular intervals. The results of this test will help your health care provider determine what dose of warfarin you need based on how quickly or slowly your blood clots. It is very important to have this test done as often as your health care provider recommends.  What kind of sample is taken? A blood sample is required for this test. It is usually collected by inserting a needle into a vein or by sticking a finger with a small needle. How do I prepare for this test? There is no preparation required for this test. What are the reference intervals? Reference intervalsare considered healthy intervalsestablished after testing a large group of healthy people. Reference intervals may vary among different people, labs, and hospitals. It is your responsibility to obtain your test results. Ask the lab or department performing the test when and how you will get your results. Reference intervals for this test are as follows:  Without anticoagulant treatment (control value): 11.0-12.5 seconds; 85-100%.  With full anticoagulant treatment: greater than 1.5-2 times the control value; 20-30%.  INR: 0.8-1.1.  What do the  results mean? There are several factors that can alter your PT and INR test results. It is important for you to know that:  PT and INR results can be affected by certain foods you eat, especially foods that contain moderate or high amounts of vitamin K. It is important to eat a consistent amount of vitamin K-rich food. Let your health care provider know if you have recently changed your diet.  PT and INR results can be affected by some medicines. Do not stop, add, or change any medicines without letting your health care provider know.  Talk with your health care provider to discuss your results, treatment options, and if necessary, the need for more tests. Talk with your health care provider if you have any questions about your results. Talk with your health care provider to discuss your results, treatment options, and if necessary, the need for more tests. Talk with your health care provider if you have any questions about your results. This information is not intended to replace advice given to you by your health care provider. Make sure you discuss any questions you have with your health care provider. Document Released: 09/29/2004 Document Revised: 05/02/2016 Document Reviewed: 01/20/2014 Elsevier Interactive Patient Education  [employer].

## 2018-06-10 ENCOUNTER — Ambulatory Visit (INDEPENDENT_AMBULATORY_CARE_PROVIDER_SITE_OTHER): Payer: PPO | Admitting: *Deleted

## 2018-06-10 ENCOUNTER — Encounter: Payer: Self-pay | Admitting: *Deleted

## 2018-06-10 VITALS — BP 118/74 | HR 83 | Ht 69.0 in | Wt 212.0 lb

## 2018-06-10 DIAGNOSIS — Z23 Encounter for immunization: Secondary | ICD-10-CM | POA: Diagnosis not present

## 2018-06-10 DIAGNOSIS — Z0000 Encounter for general adult medical examination without abnormal findings: Secondary | ICD-10-CM

## 2018-06-10 NOTE — Patient Instructions (Addendum)
Please review the information given on Advance Directives, and if you complete these please bring a copy to our office to be filed in your medical record.   Work on your goal of increasing your walking to 60 minutes 3 times per week.   You got your Prevnar 13 (pneumonia vaccine) today.   Please consider getting your Shingles vaccine (Shingrix) at your pharmacy.  Thank you for coming in for your Annual Wellness Visit today!!   Preventive Care 65 Years and Older, Male Preventive care refers to lifestyle choices and visits with your health care provider that can promote health and wellness. What does preventive care include?  A yearly physical exam. This is also called an annual well check.  Dental exams once or twice a year.  Routine eye exams. Ask your health care provider how often you should have your eyes checked.  Personal lifestyle choices, including: ? Daily care of your teeth and gums. ? Regular physical activity. ? Eating a healthy diet. ? Avoiding tobacco and drug use. ? Limiting alcohol use. ? Practicing safe sex. ? Taking low doses of aspirin every day. ? Taking vitamin and mineral supplements as recommended by your health care provider. What happens during an annual well check? The services and screenings done by your health care provider during your annual well check will depend on your age, overall health, lifestyle risk factors, and family history of disease. Counseling Your health care provider may ask you questions about your:  Alcohol use.  Tobacco use.  Drug use.  Emotional well-being.  Home and relationship well-being.  Sexual activity.  Eating habits.  History of falls.  Memory and ability to understand (cognition).  Work and work Statistician.  Screening You may have the following tests or measurements:  Height, weight, and BMI.  Blood pressure.  Lipid and cholesterol levels. These may be checked every 5 years, or more frequently  if you are over 31 years old.  Skin check.  Lung cancer screening. You may have this screening every year starting at age 43 if you have a 30-pack-year history of smoking and currently smoke or have quit within the past 15 years.  Fecal occult blood test (FOBT) of the stool. You may have this test every year starting at age 39.  Flexible sigmoidoscopy or colonoscopy. You may have a sigmoidoscopy every 5 years or a colonoscopy every 10 years starting at age 35.  Prostate cancer screening. Recommendations will vary depending on your family history and other risks.  Hepatitis C blood test.  Hepatitis B blood test.  Sexually transmitted disease (STD) testing.  Diabetes screening. This is done by checking your blood sugar (glucose) after you have not eaten for a while (fasting). You may have this done every 1-3 years.  Abdominal aortic aneurysm (AAA) screening. You may need this if you are a current or former smoker.  Osteoporosis. You may be screened starting at age 47 if you are at high risk.  Talk with your health care provider about your test results, treatment options, and if necessary, the need for more tests. Vaccines Your health care provider may recommend certain vaccines, such as:  Influenza vaccine. This is recommended every year.  Tetanus, diphtheria, and acellular pertussis (Tdap, Td) vaccine. You may need a Td booster every 10 years.  Varicella vaccine. You may need this if you have not been vaccinated.  Zoster vaccine. You may need this after age 62.  Measles, mumps, and rubella (MMR) vaccine. You may need  at least one dose of MMR if you were born in 1957 or later. You may also need a second dose.  Pneumococcal 13-valent conjugate (PCV13) vaccine. One dose is recommended after age 42.  Pneumococcal polysaccharide (PPSV23) vaccine. One dose is recommended after age 87.  Meningococcal vaccine. You may need this if you have certain conditions.  Hepatitis A vaccine.  You may need this if you have certain conditions or if you travel or work in places where you may be exposed to hepatitis A.  Hepatitis B vaccine. You may need this if you have certain conditions or if you travel or work in places where you may be exposed to hepatitis B.  Haemophilus influenzae type b (Hib) vaccine. You may need this if you have certain risk factors.  Talk to your health care provider about which screenings and vaccines you need and how often you need them. This information is not intended to replace advice given to you by your health care provider. Make sure you discuss any questions you have with your health care provider. Document Released: 09/23/2015 Document Revised: 05/16/2016 Document Reviewed: 06/28/2015 Elsevier Interactive Patient Education  [employer].

## 2018-06-10 NOTE — Progress Notes (Signed)
Subjective:   John [name] is a 67 y.o. male who presents for an Initial Medicare Annual Wellness Visit.  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  He has a history of mitral valve replacement, CAD and atrial fibrillation.  He enjoys playing pool with his friends.  John [name] lives with his wife and their Restaurant manager, fast food.  He has one son who lives in [place].  John [name] feels that his health is unchanged from last year.  He reports no surgeries, ER visits, or hospitalizations in the past year.  Review of Systems   Musculoskeletal - right ankle pain All other systems negative Cardiac Risk Factors include: advanced age (>80men, >39 women);diabetes mellitus    Objective:    Today's Vitals   06/10/18 0955  BP: 118/74  Pulse: 83  Weight: 212 lb (96.2 kg)  Height: 5\' 9"  (1.753 m)  PainSc: 3   PainLoc: Ankle   Body mass index is 31.31 kg/m.  Advanced Directives 06/10/2018 05/10/2017 01/03/2017 07/05/2016 06/04/2016 06/23/2015 08/19/2014  Does Patient Have a Medical Advance Directive? No No No No No - No  Would patient like information on creating a medical advance directive? Yes (MAU/Ambulatory/Procedural Areas - Information given) Yes (Inpatient - patient requests chaplain consult to create a medical advance directive);No - Patient declined - Yes - Educational materials given - No - patient declined information -  Pre-existing out of facility DNR order (yellow form or pink MOST form) - - - - - - -    Current Medications (verified) Outpatient Encounter Medications as of 06/10/2018  Medication Sig  . aspirin 81 MG chewable tablet Chew 81 mg by mouth daily.  Marland Kitchen dexlansoprazole (DEXILANT) 60 MG capsule Take 60 mg by mouth daily.  . furosemide (LASIX) 40 MG tablet Take 1 tablet (40 mg total) by mouth daily.  Marland Kitchen glipiZIDE (GLUCOTROL XL) 10 MG 24 hr tablet Take 1 tablet (10 mg total) by mouth  daily.  . insulin glargine (LANTUS) 100 UNIT/ML injection Inject 85 Units into the skin 2 (two) times daily as needed (takes 85 units every night at bedtime 2 nd dose only if needed for elevated blood sugars).   . metFORMIN (GLUCOPHAGE) 500 MG tablet Take 500 mg by mouth 2 (two) times daily with a meal.  . metoprolol (TOPROL-XL) 200 MG 24 hr tablet Take 1 tablet (200 mg total) by mouth daily after breakfast.  . Misc Natural Products (OSTEO BI-FLEX ADV TRIPLE ST) TABS Take 500 each by mouth daily.   . nitroGLYCERIN (NITROSTAT) 0.4 MG SL tablet Place 0.4 mg under the tongue every 5 (five) minutes as needed for chest pain.  . pravastatin (PRAVACHOL) 80 MG tablet TAKE 1 TABLET BY MOUTH DAILY EVERY EVENING  . sacubitril-valsartan (ENTRESTO) 49-51 MG Take 1 tablet by mouth 2 (two) times daily.  . sitaGLIPtin (JANUVIA) 50 MG tablet Take 50 mg by mouth daily.  Marland Kitchen spironolactone (ALDACTONE) 25 MG tablet Take 25 mg by mouth daily.  Marland Kitchen VIAGRA 100 MG tablet Take 50 mg by mouth as needed.   . warfarin (COUMADIN) 1 MG tablet Take 1 tablet (1 mg total) by mouth daily. (Patient taking differently: Take 1 mg by mouth 2 (two) times a week. Take 1 tablet on Tuesdays and Thursdays.)  . warfarin (COUMADIN) 5 MG tablet Take 5 mg by mouth daily.   No facility-administered encounter medications on file as of  06/10/2018.     Allergies (verified) Doxycycline   History: Past Medical History:  Diagnosis Date  . Allergy   . Anemia    "lost blood w/the cancer"  . Arthritis    back   . Atrial fibrillation ([place])    takes Coumadin daily  . Bruises easily    takes Coumadin daily  . CAD (coronary artery disease)    a. s/p CABG 11/2000 (L-LAD, S-RCA);  b. Lexiscan Myoview 11/12: EF 45%, ischemia and possibly some scar at the base of the inferolateral wall;   c. Lex MV 11/13:  EF 44%, Inf and IL scar/soft tissue atten, small mild amt of inf ischemia,  . CHF (congestive heart failure) ([place])   . Constipation    takes  Colace daily  . Enlarged prostate   . Flu 09/2013  . GERD (gastroesophageal reflux disease)    takes Nexium daily  . Glaucoma   . History of blood transfusion    "related to cancer"  . History of colon polyps   . HLD (hyperlipidemia)    takes Vytorin daily  . HTN (hypertension)    takes Amlodipine,Metoprolol, and Ramipril daily  . Insomnia    states he has always been like this.But doesn't take any meds  . Kidney stones    "I passed them all"  . Nocturia   . Peripheral edema    takes Furosemide daily  . Peripheral neuropathy   . Rheumatic fever ~ 1965  . Rheumatic heart disease    a. s/p mechanical (St. Jude) MVR 2002;  b. Echo 5/11: Mild LVH, EF 45-50%, mild AS, mild AI, mean gradient 11 mmHg, MVR with normal gradients, moderate LAE, mild RAE;  c.  Echo 11/13:   mod LVH, EF 55-60%, mild AS (mean 18 mmHg), mild AI, severe LAE, mild RAE, PASP 41  . 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 SOB (shortness of breath)    with exertion  . Type II diabetes mellitus ([place])    takes Januvia,Glipizide,and Metformin daily as well as Lantus   Past Surgical History:  Procedure Laterality Date  . APPLICATION OF WOUND VAC  2014  . CARDIAC CATHETERIZATION  2002  . COLONOSCOPY    . CORONARY ARTERY BYPASS GRAFT  2002   x 3  . CORONARY ARTERY BYPASS GRAFT    . ESOPHAGOGASTRODUODENOSCOPY    . HERNIA REPAIR    . INCISIONAL HERNIA REPAIR N/A 03/15/2014   Procedure: LAPAROSCOPIC INCISIONAL HERNIA;  Surgeon: Harl Bowie, MD;  Location: [facility];  Service: General;  Laterality: N/A;  . INSERTION OF MESH N/A 03/15/2014   Procedure: INSERTION OF MESH;  Surgeon: Harl Bowie, MD;  Location: [facility];  Service: General;  Laterality: N/A;  . LAPAROSCOPIC INCISIONAL / UMBILICAL / [place]  03/15/2014   IHR  . LAPAROTOMY N/A 02/09/2013   Procedure: EXPLORATORY LAPAROTOMY WITH incisional biopsy intra- ABDOMINAL MASS ILOCECTOMY ;  Surgeon: Harl Bowie, MD;  Location: [facility];  Service: General;  Laterality: N/A;  . MITRAL VALVE REPLACEMENT  11/2000   #33 St. Jude mechanical mitral valve prosthesis. Dr. Servando Snare  . PORT-A-CATH REMOVAL  03/15/2014  . PORT-A-CATH REMOVAL Left 03/15/2014   Procedure: REMOVAL PORT-A-CATH;  Surgeon: Harl Bowie, MD;  Location: [facility];  Service: General;  Laterality: Left;  . PORTACATH PLACEMENT N/A 02/09/2013   Procedure: INSERTION PORT-A-CATH;  Surgeon: Harl Bowie, MD;  Location: [facility];  Service:  General;  Laterality: N/A;  . RIGHT/LEFT HEART CATH AND CORONARY/GRAFT ANGIOGRAPHY N/A 05/10/2017   Procedure: RIGHT/LEFT HEART CATH AND CORONARY/GRAFT ANGIOGRAPHY;  Surgeon: Martinique, Peter M, MD;  Location: [facility];  Service: Cardiovascular;  Laterality: N/A;  . SEPTOPLASTY    . SMALL INTESTINE SURGERY     Family History  Problem Relation Age of Onset  . Diabetes Mother   . Hypertension Father   . Thyroid disease Sister   . Diabetes Brother   . Arthritis Brother   . Depression Brother   . Heart disease Brother   . Hyperlipidemia Brother   . Hypertension Brother   . Hypertension Brother   . Diabetes Brother   . Heart disease Brother   . Hyperlipidemia Brother    Social History   Socioeconomic History  . Marital status: Married    Spouse name: Not on file  . Number of children: Not on file  . Years of education: Not on file  . Highest education level: Not on file  Occupational History  . Not on file  Social Needs  . Financial resource strain: Not on file  . Food insecurity:    Worry: Not on file    Inability: Not on file  . Transportation needs:    Medical: Not on file    Non-medical: Not on file  Tobacco Use  . Smoking status: Former Smoker    Years: 40.00    Types: Cigars  . Smokeless tobacco: Never Used  Substance and Sexual Activity  . Alcohol use: No    Frequency: Never    Comment: "drank for 30 years; quit ~ 2000"  . Drug use: No    Types: Marijuana     Comment: quit in 2000 'reefer'  . Sexual activity: Yes  Lifestyle  . Physical activity:    Days per week: Not on file    Minutes per session: Not on file  . Stress: Not on file  Relationships  . Social connections:    Talks on phone: Not on file    Gets together: Not on file    Attends religious service: Not on file    Active member of club or organization: Not on file    Attends meetings of clubs or organizations: Not on file    Relationship status: Not on file  Other Topics Concern  . 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   Tobacco Counseling Counseling given: Yes   Clinical Intake:     Pain Score: 3                  Activities of Daily Living In your present state of health, do you have any difficulty performing the following activities: 06/10/2018  Hearing? N  Vision? N  Difficulty concentrating or making decisions? N  Walking or climbing stairs? N  Dressing or bathing? N  Doing errands, shopping? N  Preparing Food and eating ? N  Using the Toilet? N  In the past six months, have you accidently leaked urine? N  Do you have problems with loss of bowel control? N  Managing your Medications? N  Managing your Finances? N  Housekeeping or managing your Housekeeping? N  Some recent data might be hidden     Immunizations and Health Maintenance Immunization History  Administered Date(s) Administered  . Influenza, High Dose Seasonal PF 06/09/2018  . Influenza, Seasonal, Injecte, Preservative Fre 06/17/2015, 07/04/2017  . Influenza,inj,Quad PF,6+ Mos  07/04/2017  . Pneumococcal Conjugate-13 06/10/2018  . Pneumococcal Polysaccharide-23 03/16/2014, 08/17/2015  . Tdap 09/29/2011   Health Maintenance Due  Topic Date Due  . Hepatitis C Screening  1951/04/22  . HIV Screening  04/06/1968  . PNA vac Low Risk Adult (1 of 2 - PCV13) 04/06/2018   Hepatitis C and HIV Screenings recommended at next visit with Dr.  Ardeth Sportsman 13 given today  Patient Care Team: Minus Breeding, MD as PCP - General (Cardiology)  [place], MD as Consulting Physician (Oncology)      Assessment:   This is a routine wellness examination for John [name].  Hearing/Vision screen No hearing deficit noted.  Patient states he sees well with his glasses.  He is up to date on his diabetic eye exam.     Dietary issues and exercise activities discussed:  John [name] states he has changed his diet significantly over the past few years to be healthier.  He states he weighed 300 lbs at one time, and is now 212 lb.  He usually has an egg and grits for breakfast, a granola bar or fruit for lunch, and protein and vegetables for supper.  Recommended a diet of mostly vegetables, lean proteins, and fruits and whole grains in moderation.    Current Exercise Habits: Home exercise routine, Type of exercise: Other - see comments(Does rowing machine 10-15 minutes per day), Time (Minutes): 15, Frequency (Times/Week): 6, Weekly Exercise (Minutes/Week): 90, Intensity: Mild, Exercise limited by: cardiac condition(s)  Goals    . Exercise 3x per week (60 min per time)     Walk 3 times per week for 60 minutes.       Depression Screen PHQ 2/9 Scores 06/10/2018 06/09/2018 04/25/2018 04/21/2018  PHQ - 2 Score 0 0 0 0    Fall Risk Fall Risk  06/10/2018 04/25/2018 04/21/2018 04/17/2018 03/27/2018  Falls in the past year? No No No No No    Is the patient's home free of loose throw rugs in walkways, pet beds, electrical cords, etc?   no, recommended getting rid of any scatter rugs in the home.      Grab bars in the bathroom? no      Handrails on the stairs?   no stairs in home      Adequate lighting?   yes    Cognitive Function: MMSE - Mini Mental State Exam 06/10/2018  Orientation to time 5  Orientation to Place 5  Registration 3  Attention/ Calculation 5  Recall 1  Language- name 2 objects 2  Language- repeat 1  Language- follow 3 step  command 3  Language- read & follow direction 1  Write a sentence 1  Copy design 1  Total score 28        Screening Tests Health Maintenance  Topic Date Due  . Hepatitis C Screening  25-Dec-1950  . HIV Screening  04/06/1968  . PNA vac Low Risk Adult (1 of 2 - PCV13) 04/06/2018  . HEMOGLOBIN A1C  09/27/2018  . URINE MICROALBUMIN  12/25/2018  . OPHTHALMOLOGY EXAM  02/21/2019  . FOOT EXAM  03/28/2019  . TETANUS/TDAP  09/28/2021  . COLONOSCOPY  04/11/2028  . INFLUENZA VACCINE  Completed   Recommend Hepatits C and HIV screenings at next visit with Dr. Livia Snellen.   Prevnar given today.    Qualifies for Shingles Vaccine? Yes, patient has to get at his pharmacy due to insurance coverage.   Cancer Screenings: Lung: Low Dose CT Chest recommended if Age 27-80 years, 9  pack-year currently smoking OR have quit w/in 15years. Patient does qualify. Colorectal: up to date  Additional Screenings:  Hepatitis C Screening: Recommend at next visit with Dr. Livia Snellen.       Plan:      Review the information given on Advance Directives, and if you complete these please bring a copy to our office to be filed in your medical record.  Work on your goal of increasing your walking to 60 minutes 3 times per week.  Consider getting your Shingles vaccine (Shingrix) at your pharmacy. Follow up with Dr. Livia Snellen as directed.    I have personally reviewed and noted the following in the patient's chart:   . Medical and social history . Use of alcohol, tobacco or illicit drugs  . Current medications and supplements . Functional ability and status . Nutritional status . Physical activity . Advanced directives . List of other physicians . Hospitalizations, surgeries, and ER visits in previous 12 months . Vitals . Screenings to include cognitive, depression, and falls . Referrals and appointments  In addition, I have reviewed and discussed with patient certain preventive protocols, quality metrics, and  best practice recommendations. A written personalized care plan for preventive services as well as general preventive health recommendations were provided to patient.     Xolani Degracia M, RN   06/10/2018    I have reviewed and agree with the above AWV documentation.  Claretta Fraise, M.D.

## 2018-06-18 ENCOUNTER — Other Ambulatory Visit: Payer: Self-pay | Admitting: Cardiology

## 2018-06-18 MED ORDER — SPIRONOLACTONE 25 MG PO TABS: 25.0000 mg | ORAL_TABLET | Freq: Every day | ORAL | 2 refills | Status: DC

## 2018-06-18 NOTE — Telephone Encounter (Signed)
PT use to get this filled with dr nyland pt needs a 90 day supply sent to [facility] spironolactone (ALDACTONE) 25 MG tablet

## 2018-07-03 ENCOUNTER — Other Ambulatory Visit: Payer: Self-pay

## 2018-07-03 ENCOUNTER — Ambulatory Visit (HOSPITAL_COMMUNITY): Payer: PPO | Attending: Internal Medicine

## 2018-07-03 DIAGNOSIS — Z952 Presence of prosthetic heart valve: Secondary | ICD-10-CM | POA: Diagnosis not present

## 2018-07-03 DIAGNOSIS — I482 Chronic atrial fibrillation, unspecified: Secondary | ICD-10-CM | POA: Diagnosis not present

## 2018-07-03 DIAGNOSIS — I255 Ischemic cardiomyopathy: Secondary | ICD-10-CM | POA: Diagnosis not present

## 2018-07-03 MED ORDER — PERFLUTREN LIPID MICROSPHERE
1.0000 mL | INTRAVENOUS | Status: AC | PRN
  Administered 2018-07-03: 3 mL via INTRAVENOUS

## 2018-07-07 ENCOUNTER — Telehealth: Payer: Self-pay | Admitting: Cardiology

## 2018-07-07 NOTE — Telephone Encounter (Signed)
New Message:   Patient calling about some result.

## 2018-07-07 NOTE — Telephone Encounter (Signed)
Routed to P. Raul Del RN

## 2018-07-07 NOTE — Telephone Encounter (Signed)
Notes recorded by Minus Breeding, MD on 07/03/2018 at 10:37 PM EDT EF is 30 - 35% which is about the same as before. The mitral valve is functioning normally. Call Mr. Chadderdon with the results and send results to Claretta Fraise, MD  Reviewed results with patient who was grateful for the information.

## 2018-07-08 ENCOUNTER — Inpatient Hospital Stay: Payer: PPO | Attending: Oncology | Admitting: Oncology

## 2018-07-08 ENCOUNTER — Telehealth: Payer: Self-pay | Admitting: Oncology

## 2018-07-08 ENCOUNTER — Inpatient Hospital Stay: Payer: PPO

## 2018-07-08 VITALS — BP 122/53 | HR 69 | Temp 97.8°F | Resp 17 | Ht 69.0 in | Wt 217.8 lb

## 2018-07-08 DIAGNOSIS — D631 Anemia in chronic kidney disease: Secondary | ICD-10-CM | POA: Insufficient documentation

## 2018-07-08 DIAGNOSIS — C8590 Non-Hodgkin lymphoma, unspecified, unspecified site: Secondary | ICD-10-CM

## 2018-07-08 DIAGNOSIS — Z8572 Personal history of non-Hodgkin lymphomas: Secondary | ICD-10-CM | POA: Diagnosis not present

## 2018-07-08 DIAGNOSIS — N189 Chronic kidney disease, unspecified: Secondary | ICD-10-CM | POA: Diagnosis not present

## 2018-07-08 LAB — CMP (CANCER CENTER ONLY)
ALT: 19 U/L (ref 0–44)
AST: 22 U/L (ref 15–41)
Albumin: 3.6 g/dL (ref 3.5–5.0)
Alkaline Phosphatase: 116 U/L (ref 38–126)
Anion gap: 9 (ref 5–15)
BUN: 45 mg/dL — ABNORMAL HIGH (ref 8–23)
CO2: 25 mmol/L (ref 22–32)
Calcium: 9.1 mg/dL (ref 8.9–10.3)
Chloride: 107 mmol/L (ref 98–111)
Creatinine: 1.59 mg/dL — ABNORMAL HIGH (ref 0.61–1.24)
GFR, Est AFR Am: 51 mL/min — ABNORMAL LOW (ref >60)
GFR, Estimated: 44 mL/min — ABNORMAL LOW (ref >60)
Glucose, Bld: 65 mg/dL — ABNORMAL LOW (ref 70–99)
Potassium: 5.6 mmol/L — ABNORMAL HIGH (ref 3.5–5.1)
Sodium: 141 mmol/L (ref 135–145)
Total Bilirubin: 0.5 mg/dL (ref 0.3–1.2)
Total Protein: 6.9 g/dL (ref 6.5–8.1)

## 2018-07-08 LAB — CBC WITH DIFFERENTIAL (CANCER CENTER ONLY)
Abs Immature Granulocytes: 0.03 10*3/uL (ref 0.00–0.07)
Basophils Absolute: 0.1 10*3/uL (ref 0.0–0.1)
Basophils Relative: 1 %
Eosinophils Absolute: 0.2 10*3/uL (ref 0.0–0.5)
Eosinophils Relative: 3 %
HCT: 42.6 % (ref 39.0–52.0)
Hemoglobin: 14 g/dL (ref 13.0–17.0)
Immature Granulocytes: 0 %
Lymphocytes Relative: 15 %
Lymphs Abs: 1 10*3/uL (ref 0.7–4.0)
MCH: 31.3 pg (ref 26.0–34.0)
MCHC: 32.9 g/dL (ref 30.0–36.0)
MCV: 95.3 fL (ref 80.0–100.0)
Monocytes Absolute: 0.7 10*3/uL (ref 0.1–1.0)
Monocytes Relative: 10 %
Neutro Abs: 4.7 10*3/uL (ref 1.7–7.7)
Neutrophils Relative %: 71 %
Platelet Count: 186 10*3/uL (ref 150–400)
RBC: 4.47 MIL/uL (ref 4.22–5.81)
RDW: 13.6 % (ref 11.5–15.5)
WBC Count: 6.7 10*3/uL (ref 4.0–10.5)
nRBC: 0 % (ref 0.0–0.2)

## 2018-07-08 NOTE — Telephone Encounter (Signed)
Gave pt avs and calendar  °

## 2018-07-08 NOTE — Progress Notes (Signed)
Hematology and Oncology Follow Up Visit  John [name] [id number] 1951/03/18 67 y.o. 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  He was found to have stage IIA  in June of 2014.    Prior Therapy: He is status post exploratory laparotomy and excision of an intra-abdominal mass and ileocolectomy and a Port-A-Cath placement on 02/09/2013 Chemotherapy with CHOP-R cycle one given on 04/16/2013. He is status post 4 cycles of chemotherapy completed in 06/2013.  Current therapy: Active surveillance.  Interim History:  John [name] returns today for repeat evaluation.  Since the last visit, he reports feeling well without any major complaints.  He continues to watch his diet carefully without any recent exacerbation for his diabetes.  He denies any abdominal distention or loss of appetite.  He denies any painful adenopathy or constitutional symptoms.  His quality of life and performance status remain excellent.  He does not report any headaches, blurry vision, syncope or seizures.  He denies any alteration mental status or confusion.  He does not report any fevers, chills or sweats.  He does not report any chest pain, palpitation, orthopnea or leg edema.  He does not report any cough, wheezing or hemoptysis.  He denies any nausea, vomiting or abdominal distention.  He does not report any frequency urgency or hesitancy.  He denies any bone pain or pathological fractures.  He denies any bleeding or clotting tendency.  He does not report any ecchymosis or petechiae.  Rest of his review of systems is negative.  Medications: I have reviewed the patient's current medications.  Current Outpatient Medications  Medication Sig Dispense Refill  . aspirin 81 MG chewable tablet Chew 81 mg by mouth daily.    Marland Kitchen dexlansoprazole (DEXILANT) 60 MG capsule Take 60 mg by mouth daily.    . furosemide (LASIX) 40 MG tablet Take 1 tablet (40 mg  total) by mouth daily. 90 tablet 1  . glipiZIDE (GLUCOTROL XL) 10 MG 24 hr tablet Take 1 tablet (10 mg total) by mouth daily. 90 tablet 3  . insulin glargine (LANTUS) 100 UNIT/ML injection Inject 85 Units into the skin 2 (two) times daily as needed (takes 85 units every night at bedtime 2 nd dose only if needed for elevated blood sugars).     . metFORMIN (GLUCOPHAGE) 500 MG tablet Take 500 mg by mouth 2 (two) times daily with a meal.    . metoprolol (TOPROL-XL) 200 MG 24 hr tablet Take 1 tablet (200 mg total) by mouth daily after breakfast. 90 tablet 3  . Misc Natural Products (OSTEO BI-FLEX ADV TRIPLE ST) TABS Take 500 each by mouth daily.     . nitroGLYCERIN (NITROSTAT) 0.4 MG SL tablet Place 0.4 mg under the tongue every 5 (five) minutes as needed for chest pain.    . pravastatin (PRAVACHOL) 80 MG tablet TAKE 1 TABLET BY MOUTH DAILY EVERY EVENING 90 tablet 3  . sacubitril-valsartan (ENTRESTO) 49-51 MG Take 1 tablet by mouth 2 (two) times daily. 60 tablet 11  . sitaGLIPtin (JANUVIA) 50 MG tablet Take 50 mg by mouth daily.    Marland Kitchen spironolactone (ALDACTONE) 25 MG tablet Take 1 tablet (25 mg total) by mouth daily. 30 tablet 2  . VIAGRA 100 MG tablet Take 50 mg by mouth as needed.   3  . warfarin (COUMADIN) 1 MG tablet Take 1 tablet (1 mg total) by mouth daily. (Patient taking differently: Take 1 mg by mouth 2 (  two) times a week. Take 1 tablet on Tuesdays and Thursdays.) 90 tablet 1  . warfarin (COUMADIN) 5 MG tablet Take 5 mg by mouth daily.     No current facility-administered medications for this visit.      Allergies:  Allergies  Allergen Reactions  . Doxycycline Hives    Past Medical History, Surgical history, Social history, and Family History reviewed today without any changes.    Physical Exam: Blood pressure (!) 122/53, pulse 69, temperature 97.8 F (36.6 C), temperature source Oral, resp. rate 17, height 5\' 9"  (1.753 m), weight 217 lb 12.8 oz (98.8 kg), SpO2 100 %.   ECOG:  1   General appearance: Comfortable appearing without any discomfort Head: Normocephalic without any trauma Oropharynx: Mucous membranes are moist and pink without any thrush or ulcers. Eyes: Pupils are equal and round reactive to light. Lymph nodes: No cervical, supraclavicular, inguinal or axillary lymphadenopathy.   Heart:regular rate and rhythm.  S1 and S2 without leg edema. Lung: Clear without any rhonchi or wheezes.  No dullness to percussion. Abdomin: Soft, nontender, nondistended with good bowel sounds.  No hepatosplenomegaly. Musculoskeletal: No joint deformity or effusion.  Full range of motion noted. Neurological: No deficits noted on motor, sensory and deep tendon reflex exam. Skin: No petechial rash or dryness.  Appeared moist.     Lab Results: Lab Results  Component Value Date   WBC 6.7 07/08/2018   HGB 14.0 07/08/2018   HCT 42.6 07/08/2018   MCV 95.3 07/08/2018   PLT 186 07/08/2018     Chemistry      Component Value Date/Time   NA 141 07/08/2018 1026   NA 138 04/25/2018 0932   NA 141 07/09/2017 0818   K 5.6 (H) 07/08/2018 1026   K 4.9 07/09/2017 0818   CL 107 07/08/2018 1026   CL 106 01/21/2013 1050   CO2 25 07/08/2018 1026   CO2 21 (L) 07/09/2017 0818   BUN 45 (H) 07/08/2018 1026   BUN 47 (H) 04/25/2018 0932   BUN 44.9 (H) 07/09/2017 0818   CREATININE 1.59 (H) 07/08/2018 1026   CREATININE 1.4 (H) 07/09/2017 0818      Component Value Date/Time   CALCIUM 9.1 07/08/2018 1026   CALCIUM 9.1 07/09/2017 0818   ALKPHOS 116 07/08/2018 1026   ALKPHOS 104 07/09/2017 0818   AST 22 07/08/2018 1026   AST 22 07/09/2017 0818   ALT 19 07/08/2018 1026   ALT 19 07/09/2017 0818   BILITOT 0.5 07/08/2018 1026   BILITOT 0.50 07/09/2017 0818       Impression and Plan:  66 year old man with:   1.stage IIA diffuse large cell lymphoma presented with abdominal mass diagnosed in October 2014.  He remains in remission after treatment with systemic  chemotherapy.   The natural course of his disease was reviewed today including laboratory testing and physical examination.  Continues to be in remission at this time without any evidence of disease relapse.  I recommended continued active surveillance at this time on an annual basis given the risk of relapse is very low.  2. Anemia: Related to chronic disease and malignancy.  His hemoglobin is back to normal at this time no intervention is needed.    3. Chronic renal insufficiency: Related to long-standing hypertension and diabetes.  Kidney function is back to normal.  4.  Colon cancer screening: He completed a colonoscopy recently and his next one will be in 5 years.  5. Followup: Will be in 12 months.  15  minutes was spent with the patient face-to-face today.  More than 50% of time was dedicated to reviewing his disease status, natural course of his disease progression, treatment options and coordinating plan of care.    Roxy Cedar Argelia Formisano 10/29/201911:36 AM

## 2018-07-10 ENCOUNTER — Other Ambulatory Visit: Payer: PPO

## 2018-07-10 DIAGNOSIS — E119 Type 2 diabetes mellitus without complications: Secondary | ICD-10-CM | POA: Diagnosis not present

## 2018-07-10 DIAGNOSIS — I482 Chronic atrial fibrillation, unspecified: Secondary | ICD-10-CM

## 2018-07-10 DIAGNOSIS — Z794 Long term (current) use of insulin: Secondary | ICD-10-CM | POA: Diagnosis not present

## 2018-07-10 LAB — BAYER DCA HB A1C WAIVED: HB A1C (BAYER DCA - WAIVED): 6.5 % (ref <7.0)

## 2018-07-11 ENCOUNTER — Encounter: Payer: Self-pay | Admitting: Family Medicine

## 2018-07-11 ENCOUNTER — Ambulatory Visit (INDEPENDENT_AMBULATORY_CARE_PROVIDER_SITE_OTHER): Payer: PPO | Admitting: Family Medicine

## 2018-07-11 VITALS — BP 122/67 | HR 80 | Temp 96.6°F | Ht 69.0 in | Wt 216.2 lb

## 2018-07-11 DIAGNOSIS — Z9889 Other specified postprocedural states: Secondary | ICD-10-CM

## 2018-07-11 DIAGNOSIS — E785 Hyperlipidemia, unspecified: Secondary | ICD-10-CM

## 2018-07-11 DIAGNOSIS — I5022 Chronic systolic (congestive) heart failure: Secondary | ICD-10-CM | POA: Diagnosis not present

## 2018-07-11 DIAGNOSIS — Z5181 Encounter for therapeutic drug level monitoring: Secondary | ICD-10-CM

## 2018-07-11 DIAGNOSIS — I255 Ischemic cardiomyopathy: Secondary | ICD-10-CM

## 2018-07-11 DIAGNOSIS — I4811 Longstanding persistent atrial fibrillation: Secondary | ICD-10-CM

## 2018-07-11 DIAGNOSIS — Z7901 Long term (current) use of anticoagulants: Secondary | ICD-10-CM | POA: Diagnosis not present

## 2018-07-11 DIAGNOSIS — I10 Essential (primary) hypertension: Secondary | ICD-10-CM

## 2018-07-11 LAB — COAGUCHEK XS/INR WAIVED
INR: 3.5 — ABNORMAL HIGH (ref 0.9–1.1)
Prothrombin Time: 42.1 s

## 2018-07-11 MED ORDER — SITAGLIPTIN PHOSPHATE 50 MG PO TABS: 50.0000 mg | ORAL_TABLET | Freq: Every day | ORAL | 1 refills | Status: DC

## 2018-07-11 MED ORDER — DEXLANSOPRAZOLE 60 MG PO CPDR: 60.0000 mg | DELAYED_RELEASE_CAPSULE | Freq: Every day | ORAL | 1 refills | Status: DC

## 2018-07-11 MED ORDER — FUROSEMIDE 40 MG PO TABS: 40.0000 mg | ORAL_TABLET | Freq: Every day | ORAL | 1 refills | Status: DC

## 2018-07-11 MED ORDER — SPIRONOLACTONE 25 MG PO TABS: 25.0000 mg | ORAL_TABLET | Freq: Every day | ORAL | 1 refills | Status: DC

## 2018-07-11 MED ORDER — METFORMIN HCL 500 MG PO TABS: 500.0000 mg | ORAL_TABLET | Freq: Two times a day (BID) | ORAL | 1 refills | Status: DC

## 2018-07-11 MED ORDER — WARFARIN SODIUM 5 MG PO TABS: 5.0000 mg | ORAL_TABLET | Freq: Every day | ORAL | 1 refills | Status: DC

## 2018-07-11 NOTE — Progress Notes (Signed)
Subjective:  Patient ID: John [name],  male    DOB: May 22, 1951  Age: 67 y.o.    CC: Atrial Fibrillation (2 mos rck) and Diabetes   HPI John [name] presents for  follow-up of hypertension. Patient has no history of headache chest pain or shortness of breath or recent cough. Patient also denies symptoms of TIA such as numbness weakness lateralizing. Patient denies side effects from medication. States taking it regularly.    Patient also  in for follow-up of elevated cholesterol. Doing well without complaints on current medication. Denies side effects  including myalgia and arthralgia and nausea. Also in today for liver function testing. Currently no chest pain, shortness of breath or other cardiovascular related symptoms noted.  Follow-up of diabetes. Patient does check blood sugar at home. Readings run between 90 and 130. Patient denies symptoms such as excessive hunger or urinary frequency, excessive hunger, nausea No significant hypoglycemic spells noted. Medications reviewed. Pt reports taking them regularly. Pt. denies complication/adverse reaction today.    Patient in for follow-up of atrial fibrillation.  He also has an artificial mitral heart valve.  Patient denies any recent bouts of chest pain or palpitations. Additionally, patient is taking Coumadin. Patient denies any recent excessive bleeding episodes including epistaxis, bleeding from the gums, genitalia, rectal bleeding or hematuria. Additionally there has been no excessive bruising.   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   He has a past surgical history that includes Mitral valve replacement (11/2000); laparotomy (N/A, 02/09/2013); Portacath placement (N/A, 02/09/2013); Coronary artery bypass graft (2002); Septoplasty; Application if wound vac (2014); Colonoscopy; Esophagogastroduodenoscopy; Laparoscopic incisional / umbilical / ventral hernia repair (03/15/2014); Port-a-cath removal (03/15/2014); Hernia repair; Cardiac catheterization (2002); Incisional hernia repair (N/A, 03/15/2014); Port-a-cath removal (Left, 03/15/2014); Insertion of mesh (N/A, 03/15/2014); RIGHT/LEFT HEART CATH AND CORONARY/GRAFT ANGIOGRAPHY (N/A, 05/10/2017); Coronary artery bypass graft; and Small intestine surgery.   His family history includes Arthritis in his brother; Depression in his brother; Diabetes in his brother, brother, and mother; Heart disease in his brother and brother; Hyperlipidemia in his brother and brother; Hypertension in his brother, brother, and father; Thyroid disease in his sister.He reports that he has quit smoking. His smoking use included cigars. He quit after 40.00 years of use. He has never used smokeless tobacco. He reports that he does not drink alcohol or use drugs.  Current Outpatient Medications on File Prior to Visit  Medication Sig Dispense Refill  . aspirin 81 MG chewable tablet Chew 81 mg by mouth daily.    Marland Kitchen dexlansoprazole (DEXILANT) 60 MG capsule Take 60 mg by mouth daily.    . furosemide (LASIX) 40 MG tablet Take 1 tablet (40 mg total) by mouth daily. 90 tablet 1  . glipiZIDE (GLUCOTROL XL) 10 MG 24 hr tablet Take 1 tablet (10 mg total) by mouth daily. 90 tablet 3  . insulin glargine (LANTUS) 100 UNIT/ML injection Inject 85 Units into the skin 2 (two) times daily as needed (takes 85 units every night at  bedtime 2 nd dose only if  needed for elevated blood sugars).     . metFORMIN (GLUCOPHAGE) 500 MG tablet Take 500 mg by mouth 2 (two) times daily with a meal.    . metoprolol (TOPROL-XL) 200 MG 24 hr tablet Take 1 tablet (200 mg total) by mouth daily after breakfast. 90 tablet 3  . Misc Natural Products (OSTEO BI-FLEX ADV TRIPLE ST) TABS Take 500 each by mouth daily.     . nitroGLYCERIN (NITROSTAT) 0.4 MG SL tablet Place 0.4 mg under the tongue every 5 (five) minutes as needed for chest pain.    . pravastatin (PRAVACHOL) 80 MG tablet TAKE 1 TABLET BY MOUTH DAILY EVERY EVENING 90 tablet 3  . sacubitril-valsartan (ENTRESTO) 49-51 MG Take 1 tablet by mouth 2 (two) times daily. 60 tablet 11  . sitaGLIPtin (JANUVIA) 50 MG tablet Take 50 mg by mouth daily.    Marland Kitchen spironolactone (ALDACTONE) 25 MG tablet Take 1 tablet (25 mg total) by mouth daily. 30 tablet 2  . VIAGRA 100 MG tablet Take 50 mg by mouth as needed.   3  . warfarin (COUMADIN) 5 MG tablet Take 5 mg by mouth daily.     No current facility-administered medications on file prior to visit.     ROS Review of Systems  Constitutional: Negative.   HENT: Negative.   Eyes: Negative for visual disturbance.  Respiratory: Negative for cough and shortness of breath.   Cardiovascular: Negative for chest pain and leg swelling.  Gastrointestinal: Negative for abdominal pain, diarrhea, nausea and vomiting.  Genitourinary: Negative for difficulty urinating.  Musculoskeletal: Negative for arthralgias and myalgias.  Skin: Negative for rash.  Neurological: Negative for headaches.  Psychiatric/Behavioral: Negative for sleep disturbance.    Objective:  BP 122/67   Pulse 80   Temp (!) 96.6 F (35.9 C) (Oral)   Ht 5\' 9"  (1.753 m)   Wt 216 lb 3.2 oz (98.1 kg)   BMI 31.93 kg/m   BP Readings from Last 3 Encounters:  07/11/18 122/67  07/08/18 (!) 122/53  06/10/18 118/74    Wt Readings from Last 3 Encounters:  07/11/18 216 lb 3.2 oz (98.1 kg)    07/08/18 217 lb 12.8 oz (98.8 kg)  06/10/18 212 lb (96.2 kg)     Physical Exam  Constitutional: He is oriented to person, place, and time. He appears well-developed and well-nourished. No distress.  HENT:  Head: Normocephalic and atraumatic.  Right Ear: External ear normal.  Left Ear: External ear normal.  Nose: Nose normal.  Mouth/Throat: Oropharynx is clear and moist.  Eyes: Pupils are equal, round, and reactive to light. Conjunctivae and EOM are normal.  Neck: Normal range of motion. Neck supple.  Cardiovascular: Normal rate and regular rhythm.  Murmur heard. Pulmonary/Chest: Effort normal and breath sounds normal. No respiratory distress. He has no wheezes. He has no rales.  Abdominal: Soft. There is no tenderness.  Musculoskeletal: Normal range of motion.  Neurological: He is alert and oriented to person, place, and time. He has normal reflexes.  Skin: Skin is warm and dry.  Psychiatric: He has a normal mood and affect. His behavior is normal. Judgment and thought content normal.    Diabetic Foot Exam - Simple   No data filed        Assessment & Plan:   Waller was seen today for atrial fibrillation and diabetes.  Diagnoses and all orders for this visit:  Long term (current) use of anticoagulants -     CoaguChek XS/INR Waived  Longstanding persistent atrial  fibrillation  Ischemic cardiomyopathy  Encounter for therapeutic drug monitoring  Chronic systolic congestive heart failure (HCC)  Hyperlipidemia, unspecified hyperlipidemia type  Essential hypertension  MITRAL VALVE REPLACEMENT, HX OF   I am having Burtis L. Ned maintain his OSTEO BI-FLEX ADV TRIPLE ST, nitroGLYCERIN, aspirin, insulin glargine, sitaGLIPtin, dexlansoprazole, sacubitril-valsartan, VIAGRA, metFORMIN, glipiZIDE, metoprolol, pravastatin, furosemide, warfarin, and spironolactone.  No orders of the defined types were placed in this encounter.    Follow-up: Return in about 3 months  (around 10/11/2018).  Claretta Fraise, M.D.

## 2018-08-13 ENCOUNTER — Encounter: Payer: PPO | Admitting: Pharmacist Clinician (PhC)/ Clinical Pharmacy Specialist

## 2018-08-20 ENCOUNTER — Ambulatory Visit (INDEPENDENT_AMBULATORY_CARE_PROVIDER_SITE_OTHER): Payer: PPO | Admitting: Pharmacist Clinician (PhC)/ Clinical Pharmacy Specialist

## 2018-08-20 DIAGNOSIS — Z7901 Long term (current) use of anticoagulants: Secondary | ICD-10-CM | POA: Diagnosis not present

## 2018-08-20 DIAGNOSIS — I4811 Longstanding persistent atrial fibrillation: Secondary | ICD-10-CM

## 2018-08-20 LAB — COAGUCHEK XS/INR WAIVED
INR: 3.4 — ABNORMAL HIGH (ref 0.9–1.1)
Prothrombin Time: 41.3 s

## 2018-08-20 NOTE — Patient Instructions (Signed)
Description   Continue taking 6mg  on Tuesdays and Thursdays and 5mg  all other days of the week  INR today is 3.4  Perfect reading Your goal is 2.5 - 3.5  Return in 6 weeks

## 2018-08-26 ENCOUNTER — Telehealth: Payer: Self-pay

## 2018-08-26 NOTE — Telephone Encounter (Signed)
Patient assistance received from [facility] for [employer] patient assistance for the pt [place]. Forms signed by Dr.Hochrein and mailed back to [employer] in the self-addressed envelope they provided.  Copies of forms mailed and sent to [facility] to be scanned

## 2018-10-01 ENCOUNTER — Ambulatory Visit: Payer: PPO | Admitting: Pharmacist Clinician (PhC)/ Clinical Pharmacy Specialist

## 2018-10-01 DIAGNOSIS — Z7901 Long term (current) use of anticoagulants: Secondary | ICD-10-CM

## 2018-10-01 LAB — COAGUCHEK XS/INR WAIVED
INR: 3.2 — ABNORMAL HIGH (ref 0.9–1.1)
Prothrombin Time: 38.8 s

## 2018-10-01 MED ORDER — SACUBITRIL-VALSARTAN 49-51 MG PO TABS: 1.0000 | ORAL_TABLET | Freq: Two times a day (BID) | ORAL | 11 refills | Status: DC

## 2018-10-01 NOTE — Patient Instructions (Signed)
Description   Continue taking 6mg  on Tuesdays and Thursdays and 5mg  all other days of the week  INR today is 3.2  Perfect reading Your goal is 2.5 - 3.5

## 2018-10-03 ENCOUNTER — Other Ambulatory Visit: Payer: Self-pay | Admitting: Cardiology

## 2018-10-09 ENCOUNTER — Other Ambulatory Visit: Payer: PPO

## 2018-10-09 DIAGNOSIS — Z794 Long term (current) use of insulin: Secondary | ICD-10-CM

## 2018-10-09 DIAGNOSIS — E119 Type 2 diabetes mellitus without complications: Secondary | ICD-10-CM | POA: Diagnosis not present

## 2018-10-09 DIAGNOSIS — E785 Hyperlipidemia, unspecified: Secondary | ICD-10-CM | POA: Diagnosis not present

## 2018-10-09 DIAGNOSIS — I10 Essential (primary) hypertension: Secondary | ICD-10-CM | POA: Diagnosis not present

## 2018-10-09 LAB — BAYER DCA HB A1C WAIVED: HB A1C (BAYER DCA - WAIVED): 7 % — ABNORMAL HIGH (ref <7.0)

## 2018-10-10 LAB — CMP14+EGFR
ALT: 15 IU/L (ref 0–44)
AST: 22 IU/L (ref 0–40)
Albumin/Globulin Ratio: 1.6 (ref 1.2–2.2)
Albumin: 3.9 g/dL (ref 3.8–4.8)
Alkaline Phosphatase: 95 IU/L (ref 39–117)
BUN/Creatinine Ratio: 29 — ABNORMAL HIGH (ref 10–24)
BUN: 42 mg/dL — ABNORMAL HIGH (ref 8–27)
Bilirubin Total: 0.5 mg/dL (ref 0.0–1.2)
CO2: 21 mmol/L (ref 20–29)
Calcium: 9 mg/dL (ref 8.6–10.2)
Chloride: 103 mmol/L (ref 96–106)
Creatinine, Ser: 1.45 mg/dL — ABNORMAL HIGH (ref 0.76–1.27)
GFR calc Af Amer: 58 mL/min/{1.73_m2} — ABNORMAL LOW (ref >59)
GFR calc non Af Amer: 50 mL/min/{1.73_m2} — ABNORMAL LOW (ref >59)
Globulin, Total: 2.5 g/dL (ref 1.5–4.5)
Glucose: 131 mg/dL — ABNORMAL HIGH (ref 65–99)
Potassium: 5.1 mmol/L (ref 3.5–5.2)
Sodium: 141 mmol/L (ref 134–144)
Total Protein: 6.4 g/dL (ref 6.0–8.5)

## 2018-10-10 LAB — LIPID PANEL
Chol/HDL Ratio: 5.2 ratio — ABNORMAL HIGH (ref 0.0–5.0)
Cholesterol, Total: 141 mg/dL (ref 100–199)
HDL: 27 mg/dL — ABNORMAL LOW (ref >39)
LDL Calculated: 80 mg/dL (ref 0–99)
Triglycerides: 171 mg/dL — ABNORMAL HIGH (ref 0–149)
VLDL Cholesterol Cal: 34 mg/dL (ref 5–40)

## 2018-10-13 ENCOUNTER — Ambulatory Visit (INDEPENDENT_AMBULATORY_CARE_PROVIDER_SITE_OTHER): Payer: PPO | Admitting: Family Medicine

## 2018-10-13 ENCOUNTER — Encounter: Payer: Self-pay | Admitting: Family Medicine

## 2018-10-13 VITALS — BP 133/63 | HR 69 | Temp 96.9°F | Ht 69.0 in | Wt 228.5 lb

## 2018-10-13 DIAGNOSIS — Z794 Long term (current) use of insulin: Secondary | ICD-10-CM | POA: Diagnosis not present

## 2018-10-13 DIAGNOSIS — E119 Type 2 diabetes mellitus without complications: Secondary | ICD-10-CM

## 2018-10-13 DIAGNOSIS — I4811 Longstanding persistent atrial fibrillation: Secondary | ICD-10-CM

## 2018-10-13 DIAGNOSIS — Z7901 Long term (current) use of anticoagulants: Secondary | ICD-10-CM | POA: Diagnosis not present

## 2018-10-13 DIAGNOSIS — I5022 Chronic systolic (congestive) heart failure: Secondary | ICD-10-CM | POA: Diagnosis not present

## 2018-10-13 DIAGNOSIS — I255 Ischemic cardiomyopathy: Secondary | ICD-10-CM | POA: Diagnosis not present

## 2018-10-13 MED ORDER — SACUBITRIL-VALSARTAN 49-51 MG PO TABS: 1.0000 | ORAL_TABLET | Freq: Two times a day (BID) | ORAL | 1 refills | Status: DC

## 2018-10-13 MED ORDER — WARFARIN SODIUM 5 MG PO TABS: 5.0000 mg | ORAL_TABLET | Freq: Every day | ORAL | 1 refills | Status: DC

## 2018-10-13 MED ORDER — TRIAMCINOLONE ACETONIDE 0.1 % EX CREA: 1.0000 "application " | TOPICAL_CREAM | Freq: Two times a day (BID) | CUTANEOUS | 2 refills | Status: DC

## 2018-10-13 NOTE — Addendum Note (Signed)
Addended by: Claretta Fraise on: 10/13/2018 10:08 AM   Modules accepted: Orders

## 2018-10-13 NOTE — Progress Notes (Addendum)
Subjective:  Patient ID: John [name],  male    DOB: [birth year]  Age: 68 y.o.    CC: Medical Management of Chronic Issues   HPI John [name] presents for  follow-up of hypertension. Patient has no history of headache chest pain or shortness of breath or recent cough. Patient also denies symptoms of TIA such as numbness weakness lateralizing. Patient denies side effects from medication. States taking it regularly.  Patient also  in for follow-up of elevated cholesterol. Doing well without complaints on current medication. Denies side effects  including myalgia and arthralgia and nausea. Also in today for liver function testing. Currently no chest pain, shortness of breath or other cardiovascular related symptoms noted.  Follow-up of diabetes. Patient does check blood sugar at home. Readings run between 90 and 230 Patient denies symptoms such as excessive hunger or urinary frequency, excessive hunger, nausea No significant hypoglycemic spells noted. Medications reviewed. Pt reports taking them regularly. Pt. denies complication/adverse reaction today.    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   He has a past surgical history that includes Mitral valve replacement (11/2000); laparotomy (N/A, 02/09/2013); Portacath placement (N/A, 02/09/2013); Coronary artery bypass graft (2002); Septoplasty;  Application if wound vac (2014); Colonoscopy; Esophagogastroduodenoscopy; Laparoscopic incisional / umbilical / ventral hernia repair (03/15/2014); Port-a-cath removal (03/15/2014); Hernia repair; Cardiac catheterization (2002); Incisional hernia repair (N/A, 03/15/2014); Port-a-cath removal (Left, 03/15/2014); Insertion of mesh (N/A, 03/15/2014); RIGHT/LEFT HEART CATH AND CORONARY/GRAFT ANGIOGRAPHY (N/A, 05/10/2017); Coronary artery bypass graft; and Small intestine surgery.   His family history includes Arthritis in his brother; Depression in his brother; Diabetes in his brother, brother, and mother; Heart disease in his brother and brother; Hyperlipidemia in his brother and brother; Hypertension in his brother, brother, and father; Thyroid disease in his sister.He reports that he has quit smoking. His smoking use included cigars. He quit after 40.00 years of use. He has never used smokeless tobacco. He reports that he does not drink alcohol or use drugs.  Current Outpatient Medications on File Prior to Visit  Medication Sig Dispense Refill  . aspirin 81 MG chewable tablet Chew 81 mg by mouth daily.    Marland Kitchen dexlansoprazole (DEXILANT) 60 MG capsule Take 1 capsule (60 mg total) by mouth daily. 90 capsule 1  . furosemide (LASIX) 40 MG tablet Take 1 tablet (40 mg total) by mouth daily. 90 tablet 1  . glipiZIDE (GLUCOTROL XL) 10 MG 24 hr tablet Take 1 tablet (10 mg total) by mouth daily. 90 tablet 3  . insulin glargine (LANTUS) 100 UNIT/ML injection Inject 85 Units into the skin 2 (two) times daily as needed (takes 85 units every night at bedtime 2 nd dose only if needed for elevated blood sugars).     . metFORMIN (GLUCOPHAGE) 500 MG tablet Take 1 tablet (500 mg total) by mouth 2 (two) times daily with a meal. 180 tablet 1  . metoprolol (TOPROL-XL) 200 MG 24 hr tablet Take 1 tablet (200 mg total) by mouth daily after breakfast. 90 tablet 3  . Misc Natural  Products (OSTEO BI-FLEX ADV TRIPLE ST) TABS Take 500 each by  mouth daily.     . nitroGLYCERIN (NITROSTAT) 0.4 MG SL tablet Place 0.4 mg under the tongue every 5 (five) minutes as needed for chest pain.    . pravastatin (PRAVACHOL) 80 MG tablet TAKE 1 TABLET BY MOUTH DAILY EVERY EVENING 90 tablet 3  . sitaGLIPtin (JANUVIA) 50 MG tablet Take 1 tablet (50 mg total) by mouth daily. 90 tablet 1  . spironolactone (ALDACTONE) 25 MG tablet Take 1 tablet (25 mg total) by mouth daily. 90 tablet 1  . VIAGRA 100 MG tablet Take 50 mg by mouth as needed.   3   No current facility-administered medications on file prior to visit.     ROS Review of Systems  Constitutional: Negative.   HENT: Negative.   Eyes: Negative for visual disturbance.  Respiratory: Negative for cough and shortness of breath.   Cardiovascular: Negative for chest pain and leg swelling.  Gastrointestinal: Negative for abdominal pain, diarrhea, nausea and vomiting.  Genitourinary: Negative for difficulty urinating.  Musculoskeletal: Negative for arthralgias and myalgias.  Skin: Negative for rash.  Neurological: Negative for headaches.  Hematological: Bruises/bleeds easily.  Psychiatric/Behavioral: Negative for sleep disturbance.    Objective:  BP 133/63   Pulse 69   Temp (!) 96.9 F (36.1 C) (Oral)   Ht _0  (1.753 m)   Wt 228 lb 8 oz (103.6 kg)   BMI 33.74 kg/m   BP Readings from Last 3 Encounters:  10/13/18 133/63  07/11/18 122/67  07/08/18 (!) 122/53    Wt Readings from Last 3 Encounters:  10/13/18 228 lb 8 oz (103.6 kg)  07/11/18 216 lb 3.2 oz (98.1 kg)  07/08/18 217 lb 12.8 oz (98.8 kg)     Physical Exam Vitals signs reviewed.  Constitutional:      Appearance: He is well-developed.  HENT:     Head: Normocephalic and atraumatic.     Right Ear: Tympanic membrane and external ear normal. No decreased hearing noted.     Left Ear: Tympanic membrane and external ear normal. No decreased hearing noted.     Mouth/Throat:     Pharynx: No oropharyngeal exudate or  posterior oropharyngeal erythema.  Eyes:     Pupils: Pupils are equal, round, and reactive to light.  Neck:     Musculoskeletal: Normal range of motion and neck supple.  Cardiovascular:     Rate and Rhythm: Normal rate and regular rhythm.     Heart sounds: Murmur present. Crescendo  systolic murmur present with a grade of 2/6. No gallop. No S3 or S4 sounds.   Pulmonary:     Effort: No respiratory distress.     Breath sounds: Normal breath sounds.  Abdominal:     General: Bowel sounds are normal.     Palpations: Abdomen is soft. There is no mass.     Tenderness: There is no abdominal tenderness.  Musculoskeletal:     Right lower leg: No edema.     Left lower leg: No edema.    Results for orders placed or performed in visit on 10/09/18  CMP14+EGFR  Result Value Ref Range   Glucose 131 (H) 65 - 99 mg/dL   BUN 42 (H) 8 - 27 mg/dL   Creatinine, Ser 1.45 (H) 0.76 - 1.27 mg/dL   GFR calc non Af Amer 50 (L) >59 mL/min/1.73   GFR calc Af Amer 58 (L) >59 mL/min/1.73   BUN/Creatinine Ratio 29 (H) 10 - 24  Sodium 141 134 - 144 mmol/L   Potassium 5.1 3.5 - 5.2 mmol/L   Chloride 103 96 - 106 mmol/L   CO2 21 20 - 29 mmol/L   Calcium 9.0 8.6 - 10.2 mg/dL   Total Protein 6.4 6.0 - 8.5 g/dL   Albumin 3.9 3.8 - 4.8 g/dL   Globulin, Total 2.5 1.5 - 4.5 g/dL   Albumin/Globulin Ratio 1.6 1.2 - 2.2   Bilirubin Total 0.5 0.0 - 1.2 mg/dL   Alkaline Phosphatase 95 39 - 117 IU/L   AST 22 0 - 40 IU/L   ALT 15 0 - 44 IU/L  Lipid panel  Result Value Ref Range   Cholesterol, Total 141 100 - 199 mg/dL   Triglycerides 171 (H) 0 - 149 mg/dL   HDL 27 (L) >39 mg/dL   VLDL Cholesterol Cal 34 5 - 40 mg/dL   LDL Calculated 80 0 - 99 mg/dL   Chol/HDL Ratio 5.2 (H) 0.0 - 5.0 ratio  Bayer DCA Hb A1c Waived  Result Value Ref Range   HB A1C (BAYER DCA - WAIVED) 7.0 (H) <7.0 %        Assessment & Plan:   John [name] was seen today for medical management of chronic issues.  Diagnoses and all orders for  this visit:  Type 2 diabetes mellitus without complication, with long-term current use of insulin (HCC)  Ischemic cardiomyopathy  Chronic systolic congestive heart failure ([place])  Long term (current) use of anticoagulants [Z79.01]  Longstanding persistent atrial fibrillation  Other orders -     warfarin (COUMADIN) 5 MG tablet; Take 1 tablet (5 mg total) by mouth daily. -     sacubitril-valsartan (ENTRESTO) 49-51 MG; Take 1 tablet by mouth 2 (two) times daily. -     triamcinolone cream (KENALOG) 0.1 %; Apply 1 application topically 2 (two) times daily. Avoid face and genitalia   I have changed John [name]'s ENTRESTO to sacubitril-valsartan. I am also having him start on triamcinolone cream. Additionally, I am having him maintain his OSTEO BI-FLEX ADV TRIPLE ST, nitroGLYCERIN, aspirin, insulin glargine, VIAGRA, glipiZIDE, metoprolol, pravastatin, spironolactone, sitaGLIPtin, metFORMIN, furosemide, dexlansoprazole, and warfarin.  Meds ordered this encounter  Medications  . warfarin (COUMADIN) 5 MG tablet    Sig: Take 1 tablet (5 mg total) by mouth daily.    Dispense:  90 tablet    Refill:  1  . sacubitril-valsartan (ENTRESTO) 49-51 MG    Sig: Take 1 tablet by mouth 2 (two) times daily.    Dispense:  180 tablet    Refill:  1  . triamcinolone cream (KENALOG) 0.1 %    Sig: Apply 1 application topically 2 (two) times daily. Avoid face and genitalia    Dispense:  45 g    Refill:  2     Follow-up: Return in about 3 months (around 01/11/2019).  Claretta Fraise, M.D.

## 2018-11-12 ENCOUNTER — Encounter: Payer: Self-pay | Admitting: Pharmacist Clinician (PhC)/ Clinical Pharmacy Specialist

## 2018-11-12 ENCOUNTER — Ambulatory Visit (INDEPENDENT_AMBULATORY_CARE_PROVIDER_SITE_OTHER): Payer: PPO | Admitting: Pharmacist Clinician (PhC)/ Clinical Pharmacy Specialist

## 2018-11-12 DIAGNOSIS — Z7901 Long term (current) use of anticoagulants: Secondary | ICD-10-CM | POA: Diagnosis not present

## 2018-11-12 LAB — COAGUCHEK XS/INR WAIVED
INR: 3.4 — ABNORMAL HIGH (ref 0.9–1.1)
Prothrombin Time: 41.4 s

## 2018-11-12 NOTE — Patient Instructions (Signed)
Description   Continue taking 6mg  on Tuesdays and Thursdays and 5mg  all other days of the week  INR today is 3.4 Perfect reading  Your goal is 2.5 - 3.5

## 2018-12-12 ENCOUNTER — Other Ambulatory Visit: Payer: Self-pay | Admitting: Family Medicine

## 2018-12-24 ENCOUNTER — Ambulatory Visit (INDEPENDENT_AMBULATORY_CARE_PROVIDER_SITE_OTHER): Payer: PPO | Admitting: Pharmacist Clinician (PhC)/ Clinical Pharmacy Specialist

## 2018-12-24 ENCOUNTER — Other Ambulatory Visit: Payer: Self-pay

## 2018-12-24 ENCOUNTER — Encounter: Payer: Self-pay | Admitting: Pharmacist Clinician (PhC)/ Clinical Pharmacy Specialist

## 2018-12-24 DIAGNOSIS — Z7901 Long term (current) use of anticoagulants: Secondary | ICD-10-CM | POA: Diagnosis not present

## 2018-12-24 LAB — COAGUCHEK XS/INR WAIVED
INR: 4.3 — ABNORMAL HIGH (ref 0.9–1.1)
Prothrombin Time: 52.1 s

## 2018-12-24 MED ORDER — METFORMIN HCL 500 MG PO TABS: 500.0000 mg | ORAL_TABLET | Freq: Two times a day (BID) | ORAL | 1 refills | Status: DC

## 2018-12-24 NOTE — Patient Instructions (Signed)
Description   No warfarin today and 5mg  tomorrow, then continue taking 6mg  on Tuesdays and Thursdays and 5mg  all other days of the week  INR today is 4.3 (goal is 2.5-3.5)  Too thin today

## 2019-01-07 ENCOUNTER — Ambulatory Visit: Payer: PPO | Admitting: Cardiology

## 2019-01-12 ENCOUNTER — Other Ambulatory Visit: Payer: Self-pay

## 2019-01-12 ENCOUNTER — Other Ambulatory Visit: Payer: PPO

## 2019-01-12 DIAGNOSIS — Z794 Long term (current) use of insulin: Principal | ICD-10-CM

## 2019-01-12 DIAGNOSIS — E119 Type 2 diabetes mellitus without complications: Secondary | ICD-10-CM | POA: Diagnosis not present

## 2019-01-12 DIAGNOSIS — I10 Essential (primary) hypertension: Secondary | ICD-10-CM

## 2019-01-12 DIAGNOSIS — E785 Hyperlipidemia, unspecified: Secondary | ICD-10-CM

## 2019-01-12 LAB — BAYER DCA HB A1C WAIVED: HB A1C (BAYER DCA - WAIVED): 6.9 % (ref <7.0)

## 2019-01-13 ENCOUNTER — Ambulatory Visit: Payer: PPO | Admitting: Family Medicine

## 2019-01-13 LAB — CMP14+EGFR
ALT: 21 IU/L (ref 0–44)
AST: 26 IU/L (ref 0–40)
Albumin/Globulin Ratio: 1.8 (ref 1.2–2.2)
Albumin: 4.1 g/dL (ref 3.8–4.8)
Alkaline Phosphatase: 105 IU/L (ref 39–117)
BUN/Creatinine Ratio: 34 — ABNORMAL HIGH (ref 10–24)
BUN: 54 mg/dL — ABNORMAL HIGH (ref 8–27)
Bilirubin Total: 0.2 mg/dL (ref 0.0–1.2)
CO2: 18 mmol/L — ABNORMAL LOW (ref 20–29)
Calcium: 9.2 mg/dL (ref 8.6–10.2)
Chloride: 105 mmol/L (ref 96–106)
Creatinine, Ser: 1.57 mg/dL — ABNORMAL HIGH (ref 0.76–1.27)
GFR calc Af Amer: 53 mL/min/{1.73_m2} — ABNORMAL LOW (ref >59)
GFR calc non Af Amer: 46 mL/min/{1.73_m2} — ABNORMAL LOW (ref >59)
Globulin, Total: 2.3 g/dL (ref 1.5–4.5)
Glucose: 153 mg/dL — ABNORMAL HIGH (ref 65–99)
Potassium: 5.5 mmol/L — ABNORMAL HIGH (ref 3.5–5.2)
Sodium: 140 mmol/L (ref 134–144)
Total Protein: 6.4 g/dL (ref 6.0–8.5)

## 2019-01-13 LAB — LIPID PANEL
Chol/HDL Ratio: 5.7 ratio — ABNORMAL HIGH (ref 0.0–5.0)
Cholesterol, Total: 137 mg/dL (ref 100–199)
HDL: 24 mg/dL — ABNORMAL LOW (ref >39)
LDL Calculated: 62 mg/dL (ref 0–99)
Triglycerides: 254 mg/dL — ABNORMAL HIGH (ref 0–149)
VLDL Cholesterol Cal: 51 mg/dL — ABNORMAL HIGH (ref 5–40)

## 2019-01-14 ENCOUNTER — Other Ambulatory Visit: Payer: Self-pay | Admitting: Family Medicine

## 2019-01-14 ENCOUNTER — Encounter: Payer: Self-pay | Admitting: Family Medicine

## 2019-01-14 ENCOUNTER — Other Ambulatory Visit: Payer: Self-pay

## 2019-01-14 ENCOUNTER — Ambulatory Visit (INDEPENDENT_AMBULATORY_CARE_PROVIDER_SITE_OTHER): Payer: PPO | Admitting: Family Medicine

## 2019-01-14 VITALS — BP 131/65 | HR 78 | Temp 97.0°F | Ht 69.0 in | Wt 219.0 lb

## 2019-01-14 DIAGNOSIS — I4811 Longstanding persistent atrial fibrillation: Secondary | ICD-10-CM

## 2019-01-14 LAB — COAGUCHEK XS/INR WAIVED
INR: 3.5 — ABNORMAL HIGH (ref 0.9–1.1)
Prothrombin Time: 41.9 s

## 2019-01-14 MED ORDER — SITAGLIPTIN PHOSPHATE 50 MG PO TABS: 50.0000 mg | ORAL_TABLET | Freq: Every day | ORAL | 1 refills | Status: DC

## 2019-01-14 MED ORDER — FUROSEMIDE 40 MG PO TABS: 40.0000 mg | ORAL_TABLET | Freq: Every day | ORAL | 1 refills | Status: DC

## 2019-01-14 MED ORDER — SPIRONOLACTONE 25 MG PO TABS: 25.0000 mg | ORAL_TABLET | Freq: Every day | ORAL | 1 refills | Status: DC

## 2019-01-14 MED ORDER — INSULIN GLARGINE 100 UNIT/ML ~~LOC~~ SOLN: 85.0000 [IU] | Freq: Two times a day (BID) | SUBCUTANEOUS | 5 refills | Status: DC | PRN

## 2019-01-14 MED ORDER — GLIPIZIDE ER 10 MG PO TB24: 10.0000 mg | ORAL_TABLET | Freq: Every day | ORAL | 3 refills | Status: DC

## 2019-01-14 MED ORDER — WARFARIN SODIUM 5 MG PO TABS: 5.0000 mg | ORAL_TABLET | Freq: Every day | ORAL | 1 refills | Status: DC

## 2019-01-14 MED ORDER — METOPROLOL SUCCINATE ER 200 MG PO TB24: 200.0000 mg | ORAL_TABLET | Freq: Every day | ORAL | 3 refills | Status: DC

## 2019-01-14 MED ORDER — DEXLANSOPRAZOLE 60 MG PO CPDR: 60.0000 mg | DELAYED_RELEASE_CAPSULE | Freq: Every day | ORAL | 1 refills | Status: DC

## 2019-01-14 MED ORDER — PRAVASTATIN SODIUM 80 MG PO TABS: ORAL_TABLET | ORAL | 3 refills | Status: DC

## 2019-01-14 MED ORDER — METFORMIN HCL 500 MG PO TABS: 500.0000 mg | ORAL_TABLET | Freq: Two times a day (BID) | ORAL | 1 refills | Status: DC

## 2019-01-14 NOTE — Progress Notes (Signed)
Subjective:  Patient ID: John [name],  male    DOB: Apr 05, 1951  Age: 68 y.o.    CC: Atrial Fibrillation (3 mo) and Diabetes   HPI John [name] presents for  follow-up of hypertension. Patient has no history of headache chest pain or shortness of breath or recent cough. Patient also denies symptoms of TIA such as numbness weakness lateralizing. Patient denies side effects from medication. States taking it regularly.  Patient also  in for follow-up of elevated cholesterol. Doing well without complaints on current medication. Denies side effects  including myalgia and arthralgia and nausea. Also in today for liver function testing. Currently no chest pain, shortness of breath or other cardiovascular related symptoms noted.  Follow-up of diabetes. Patient does check blood sugar at home. Readings run between 90-125 fasting and 123-254 on log sheets. Patient denies symptoms such as excessive hunger or urinary frequency, excessive hunger, nausea No significant hypoglycemic spells noted. Medications reviewed. Pt reports taking insulin based on glucose reading. Skips some days. Log pattern shows glucose goes too high after skipping.    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   He has a past surgical history that includes Mitral valve replacement (11/2000); laparotomy (N/A, 02/09/2013); Portacath  placement (N/A, 02/09/2013); Coronary artery bypass graft (2002); Septoplasty; Application if wound vac (2014); Colonoscopy; Esophagogastroduodenoscopy; Laparoscopic incisional / umbilical / ventral hernia repair (03/15/2014); Port-a-cath removal (03/15/2014); Hernia repair; Cardiac catheterization (2002); Incisional hernia repair (N/A, 03/15/2014); Port-a-cath removal (Left, 03/15/2014); Insertion of mesh (N/A, 03/15/2014); RIGHT/LEFT HEART CATH AND CORONARY/GRAFT ANGIOGRAPHY (N/A, 05/10/2017); Coronary artery bypass graft; and Small intestine surgery.   His family history includes Arthritis in his brother; Depression in his brother; Diabetes in his brother, brother, and mother; Heart disease in his brother and brother; Hyperlipidemia in his brother and brother; Hypertension in his brother, brother, and father; Thyroid disease in his sister.He reports that he has quit smoking. His smoking use included cigars. He quit after 40.00 years of use. He has never used smokeless tobacco. He reports that he does not drink alcohol or use drugs.  Current Outpatient Medications on File Prior to Visit  Medication Sig Dispense Refill  . aspirin 81 MG chewable tablet Chew 81 mg by mouth daily.    Marland Kitchen glucose blood (ONETOUCH VERIO) test strip Test BS BID Dx E11.9 200 each 3  . Misc Natural Products (OSTEO BI-FLEX ADV TRIPLE ST) TABS Take 500 each by mouth daily.     . sacubitril-valsartan (ENTRESTO) 49-51 MG Take 1 tablet by mouth 2 (two) times daily. 180 tablet 1  . VIAGRA 100 MG tablet Take 50 mg by mouth as needed.   3  . nitroGLYCERIN (NITROSTAT) 0.4 MG SL tablet Place 0.4 mg under the tongue every 5 (five) minutes as needed for chest pain.    Marland Kitchen triamcinolone cream (KENALOG) 0.1 % Apply 1 application topically 2 (two) times daily. Avoid face and genitalia (Patient not taking: Reported on 01/14/2019) 45 g 2   No current facility-administered medications on file prior to visit.  ROS Review of Systems  Constitutional:  Negative.   HENT: Negative.   Eyes: Negative for visual disturbance.  Respiratory: Negative for cough and shortness of breath.   Cardiovascular: Negative for chest pain and leg swelling.  Gastrointestinal: Negative for abdominal pain, diarrhea, nausea and vomiting.  Genitourinary: Negative for difficulty urinating.  Musculoskeletal: Negative for arthralgias and myalgias.  Skin: Negative for rash.  Neurological: Negative for headaches.  Psychiatric/Behavioral: Negative for sleep disturbance.    Objective:  BP 131/65 (BP Location: Left Arm)   Pulse 78   Temp (!) 97 F (36.1 C) (Oral)   Ht 5\' 9"  (1.753 m)   Wt 219 lb (99.3 kg)   BMI 32.34 kg/m   BP Readings from Last 3 Encounters:  01/14/19 131/65  10/13/18 133/63  07/11/18 122/67    Wt Readings from Last 3 Encounters:  01/14/19 219 lb (99.3 kg)  10/13/18 228 lb 8 oz (103.6 kg)  07/11/18 216 lb 3.2 oz (98.1 kg)     Physical Exam Vitals signs reviewed.  Constitutional:      Appearance: He is well-developed.  HENT:     Head: Normocephalic and atraumatic.     Right Ear: Tympanic membrane and external ear normal. No decreased hearing noted.     Left Ear: Tympanic membrane and external ear normal. No decreased hearing noted.     Mouth/Throat:     Pharynx: No oropharyngeal exudate or posterior oropharyngeal erythema.  Eyes:     Pupils: Pupils are equal, round, and reactive to light.  Neck:     Musculoskeletal: Normal range of motion and neck supple.  Cardiovascular:     Rate and Rhythm: Normal rate and regular rhythm.     Heart sounds: No murmur.  Pulmonary:     Effort: No respiratory distress.     Breath sounds: Normal breath sounds.  Abdominal:     General: Bowel sounds are normal.     Palpations: Abdomen is soft. There is no mass.     Tenderness: There is no abdominal tenderness.    Assessment & Plan:   John [name] was seen today for atrial fibrillation and diabetes.  Diagnoses and all orders for this visit:   Longstanding persistent atrial fibrillation -     CoaguChek XS/INR Waived  Other orders -     warfarin (COUMADIN) 5 MG tablet; Take 1 tablet (5 mg total) by mouth daily. -     sitaGLIPtin (JANUVIA) 50 MG tablet; Take 1 tablet (50 mg total) by mouth daily. -     pravastatin (PRAVACHOL) 80 MG tablet; TAKE 1 TABLET BY MOUTH DAILY EVERY EVENING -     metoprolol (TOPROL-XL) 200 MG 24 hr tablet; Take 1 tablet (200 mg total) by mouth daily after breakfast. -     metFORMIN (GLUCOPHAGE) 500 MG tablet; Take 1 tablet (500 mg total) by mouth 2 (two) times daily with a meal. -     glipiZIDE (GLUCOTROL XL) 10 MG 24 hr tablet; Take 1 tablet (10 mg total) by mouth daily. -     insulin glargine (LANTUS) 100 UNIT/ML injection; Inject 0.85 mLs (85 Units total) into the skin 2 (two) times daily as needed (takes 85 units every night at bedtime 2 nd dose only if needed for elevated blood sugars). -     furosemide (LASIX) 40 MG tablet; Take 1 tablet (40 mg total) by mouth daily. -     dexlansoprazole (DEXILANT) 60 MG capsule; Take 1 capsule (60 mg total) by mouth daily. -  spironolactone (ALDACTONE) 25 MG tablet; Take 1 tablet (25 mg total) by mouth daily.   I have changed John [name]'s insulin glargine. I am also having him maintain his Osteo Bi-Flex Adv Triple St, nitroGLYCERIN, aspirin, Viagra, sacubitril-valsartan, triamcinolone cream, glucose blood, warfarin, sitaGLIPtin, pravastatin, metoprolol, metFORMIN, glipiZIDE, furosemide, dexlansoprazole, and spironolactone.  Meds ordered this encounter  Medications  . warfarin (COUMADIN) 5 MG tablet    Sig: Take 1 tablet (5 mg total) by mouth daily.    Dispense:  90 tablet    Refill:  1  . sitaGLIPtin (JANUVIA) 50 MG tablet    Sig: Take 1 tablet (50 mg total) by mouth daily.    Dispense:  90 tablet    Refill:  1  . pravastatin (PRAVACHOL) 80 MG tablet    Sig: TAKE 1 TABLET BY MOUTH DAILY EVERY EVENING    Dispense:  90 tablet    Refill:  3  .  metoprolol (TOPROL-XL) 200 MG 24 hr tablet    Sig: Take 1 tablet (200 mg total) by mouth daily after breakfast.    Dispense:  90 tablet    Refill:  3  . metFORMIN (GLUCOPHAGE) 500 MG tablet    Sig: Take 1 tablet (500 mg total) by mouth 2 (two) times daily with a meal.    Dispense:  180 tablet    Refill:  1  . glipiZIDE (GLUCOTROL XL) 10 MG 24 hr tablet    Sig: Take 1 tablet (10 mg total) by mouth daily.    Dispense:  90 tablet    Refill:  3  . insulin glargine (LANTUS) 100 UNIT/ML injection    Sig: Inject 0.85 mLs (85 Units total) into the skin 2 (two) times daily as needed (takes 85 units every night at bedtime 2 nd dose only if needed for elevated blood sugars).    Dispense:  10 mL    Refill:  5  . furosemide (LASIX) 40 MG tablet    Sig: Take 1 tablet (40 mg total) by mouth daily.    Dispense:  90 tablet    Refill:  1  . dexlansoprazole (DEXILANT) 60 MG capsule    Sig: Take 1 capsule (60 mg total) by mouth daily.    Dispense:  90 capsule    Refill:  1  . spironolactone (ALDACTONE) 25 MG tablet    Sig: Take 1 tablet (25 mg total) by mouth daily.    Dispense:  90 tablet    Refill:  1     Follow-up: Return in about 3 months (around 04/16/2019).  Claretta Fraise, M.D.

## 2019-01-14 NOTE — Patient Instructions (Signed)
Decrease Spironolactone to 1/2 tab daily

## 2019-01-15 ENCOUNTER — Encounter: Payer: Self-pay | Admitting: Family Medicine

## 2019-01-28 ENCOUNTER — Encounter: Payer: Self-pay | Admitting: *Deleted

## 2019-02-17 ENCOUNTER — Telehealth: Payer: Self-pay | Admitting: Family Medicine

## 2019-02-18 ENCOUNTER — Encounter: Payer: Self-pay | Admitting: Pharmacist Clinician (PhC)/ Clinical Pharmacy Specialist

## 2019-02-18 ENCOUNTER — Ambulatory Visit (INDEPENDENT_AMBULATORY_CARE_PROVIDER_SITE_OTHER): Payer: PPO | Admitting: Pharmacist Clinician (PhC)/ Clinical Pharmacy Specialist

## 2019-02-18 ENCOUNTER — Other Ambulatory Visit: Payer: Self-pay

## 2019-02-18 DIAGNOSIS — Z7901 Long term (current) use of anticoagulants: Secondary | ICD-10-CM

## 2019-02-18 LAB — COAGUCHEK XS/INR WAIVED
INR: 3.5 — ABNORMAL HIGH (ref 0.9–1.1)
Prothrombin Time: 41.4 s

## 2019-02-18 NOTE — Patient Instructions (Signed)
Description   Continue taking 5mg  every day.  INR today is 3.5 (goal is 2.5-3.5) Perfect reading today!

## 2019-02-25 ENCOUNTER — Encounter: Payer: Self-pay | Admitting: Pharmacist Clinician (PhC)/ Clinical Pharmacy Specialist

## 2019-03-19 ENCOUNTER — Encounter: Payer: Self-pay | Admitting: *Deleted

## 2019-04-10 ENCOUNTER — Telehealth: Payer: Self-pay | Admitting: Family Medicine

## 2019-04-10 ENCOUNTER — Other Ambulatory Visit: Payer: Self-pay

## 2019-04-10 ENCOUNTER — Other Ambulatory Visit: Payer: PPO

## 2019-04-10 DIAGNOSIS — E119 Type 2 diabetes mellitus without complications: Secondary | ICD-10-CM | POA: Diagnosis not present

## 2019-04-10 DIAGNOSIS — Z794 Long term (current) use of insulin: Secondary | ICD-10-CM | POA: Diagnosis not present

## 2019-04-10 DIAGNOSIS — E785 Hyperlipidemia, unspecified: Secondary | ICD-10-CM | POA: Diagnosis not present

## 2019-04-10 DIAGNOSIS — I10 Essential (primary) hypertension: Secondary | ICD-10-CM | POA: Diagnosis not present

## 2019-04-10 LAB — BAYER DCA HB A1C WAIVED: HB A1C (BAYER DCA - WAIVED): 6.8 % (ref <7.0)

## 2019-04-11 LAB — CMP14+EGFR
ALT: 19 IU/L (ref 0–44)
AST: 21 IU/L (ref 0–40)
Albumin/Globulin Ratio: 1.7 (ref 1.2–2.2)
Albumin: 4 g/dL (ref 3.8–4.8)
Alkaline Phosphatase: 104 IU/L (ref 39–117)
BUN/Creatinine Ratio: 36 — ABNORMAL HIGH (ref 10–24)
BUN: 51 mg/dL — ABNORMAL HIGH (ref 8–27)
Bilirubin Total: 0.3 mg/dL (ref 0.0–1.2)
CO2: 22 mmol/L (ref 20–29)
Calcium: 8.7 mg/dL (ref 8.6–10.2)
Chloride: 103 mmol/L (ref 96–106)
Creatinine, Ser: 1.43 mg/dL — ABNORMAL HIGH (ref 0.76–1.27)
GFR calc Af Amer: 59 mL/min/{1.73_m2} — ABNORMAL LOW (ref >59)
GFR calc non Af Amer: 51 mL/min/{1.73_m2} — ABNORMAL LOW (ref >59)
Globulin, Total: 2.3 g/dL (ref 1.5–4.5)
Glucose: 164 mg/dL — ABNORMAL HIGH (ref 65–99)
Potassium: 5.2 mmol/L (ref 3.5–5.2)
Sodium: 138 mmol/L (ref 134–144)
Total Protein: 6.3 g/dL (ref 6.0–8.5)

## 2019-04-11 LAB — LIPID PANEL
Chol/HDL Ratio: 5 ratio (ref 0.0–5.0)
Cholesterol, Total: 125 mg/dL (ref 100–199)
HDL: 25 mg/dL — ABNORMAL LOW (ref >39)
LDL Calculated: 65 mg/dL (ref 0–99)
Triglycerides: 174 mg/dL — ABNORMAL HIGH (ref 0–149)
VLDL Cholesterol Cal: 35 mg/dL (ref 5–40)

## 2019-04-13 ENCOUNTER — Encounter: Payer: Self-pay | Admitting: Family Medicine

## 2019-04-13 ENCOUNTER — Ambulatory Visit (INDEPENDENT_AMBULATORY_CARE_PROVIDER_SITE_OTHER): Payer: PPO | Admitting: Family Medicine

## 2019-04-13 ENCOUNTER — Other Ambulatory Visit: Payer: Self-pay

## 2019-04-13 VITALS — BP 138/72 | HR 81 | Temp 96.6°F | Ht 69.0 in | Wt 216.7 lb

## 2019-04-13 DIAGNOSIS — E119 Type 2 diabetes mellitus without complications: Secondary | ICD-10-CM | POA: Diagnosis not present

## 2019-04-13 DIAGNOSIS — Z7901 Long term (current) use of anticoagulants: Secondary | ICD-10-CM

## 2019-04-13 DIAGNOSIS — E785 Hyperlipidemia, unspecified: Secondary | ICD-10-CM | POA: Diagnosis not present

## 2019-04-13 DIAGNOSIS — I10 Essential (primary) hypertension: Secondary | ICD-10-CM

## 2019-04-13 DIAGNOSIS — I4811 Longstanding persistent atrial fibrillation: Secondary | ICD-10-CM

## 2019-04-13 DIAGNOSIS — Z794 Long term (current) use of insulin: Secondary | ICD-10-CM

## 2019-04-13 LAB — COAGUCHEK XS/INR WAIVED
INR: 3.2 — ABNORMAL HIGH (ref 0.9–1.1)
Prothrombin Time: 39 s

## 2019-04-13 MED ORDER — INSULIN GLARGINE 100 UNIT/ML ~~LOC~~ SOLN: 85.0000 [IU] | Freq: Two times a day (BID) | SUBCUTANEOUS | 5 refills | Status: DC | PRN

## 2019-04-13 MED ORDER — METOPROLOL SUCCINATE ER 200 MG PO TB24: 200.0000 mg | ORAL_TABLET | Freq: Every day | ORAL | 3 refills | Status: DC

## 2019-04-13 MED ORDER — FUROSEMIDE 40 MG PO TABS: 40.0000 mg | ORAL_TABLET | Freq: Every day | ORAL | 1 refills | Status: DC

## 2019-04-13 MED ORDER — METFORMIN HCL 500 MG PO TABS: 500.0000 mg | ORAL_TABLET | Freq: Two times a day (BID) | ORAL | 1 refills | Status: DC

## 2019-04-13 MED ORDER — ENTRESTO 49-51 MG PO TABS: 1.0000 | ORAL_TABLET | Freq: Two times a day (BID) | ORAL | 1 refills | Status: DC

## 2019-04-13 MED ORDER — SITAGLIPTIN PHOSPHATE 50 MG PO TABS: 50.0000 mg | ORAL_TABLET | Freq: Every day | ORAL | 1 refills | Status: DC

## 2019-04-13 MED ORDER — WARFARIN SODIUM 5 MG PO TABS: 5.0000 mg | ORAL_TABLET | Freq: Every day | ORAL | 1 refills | Status: DC

## 2019-04-13 MED ORDER — GLIPIZIDE ER 10 MG PO TB24: 10.0000 mg | ORAL_TABLET | Freq: Every day | ORAL | 1 refills | Status: DC

## 2019-04-13 MED ORDER — SPIRONOLACTONE 25 MG PO TABS: 25.0000 mg | ORAL_TABLET | Freq: Every day | ORAL | 1 refills | Status: DC

## 2019-04-13 MED ORDER — DEXILANT 60 MG PO CPDR: 60.0000 mg | DELAYED_RELEASE_CAPSULE | Freq: Every day | ORAL | 1 refills | Status: DC

## 2019-04-13 NOTE — Progress Notes (Signed)
Subjective:  Patient ID: John [name],  male    DOB: [birth year]  Age: 68 y.o.    CC: Coagulation Disorder and Diabetes (3 month follow up)   HPI John [name] presents for  follow-up of hypertension. Patient has no history of headache chest pain or shortness of breath or recent cough. Patient also denies symptoms of TIA such as numbness weakness lateralizing. Patient denies side effects from medication. States taking it regularly.  Patient also  in for follow-up of elevated cholesterol. Doing well without complaints on current medication. Denies side effects  including myalgia and arthralgia and nausea. Also in today for liver function testing. Currently no chest pain, shortness of breath or other cardiovascular related symptoms noted.  Follow-up of diabetes. Patient does check blood sugar at home. Readings run between 95 to 120 fasting and 120-200 postprandial (log reviewed.) sent to be scanned and atttached to chart Patient denies symptoms such as excessive hunger or urinary frequency, excessive hunger, nausea No significant hypoglycemic spells noted. Medications reviewed. Pt reports taking them regularly. Pt. denies complication/adverse reaction today.   Patient in for follow-up of atrial fibrillation. Patient denies any recent bouts of chest pain or palpitations. Additionally, patient is taking anticoagulants. Patient denies any recent excessive bleeding episodes including epistaxis, bleeding from the gums, genitalia, rectal bleeding or hematuria. Additionally there has been no excessive bruising. 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   He has a past surgical history that includes Mitral valve replacement (11/2000); laparotomy (N/A, 02/09/2013); Portacath placement (N/A, 02/09/2013); Coronary artery bypass graft (2002); Septoplasty; Application if wound vac (2014); Colonoscopy; Esophagogastroduodenoscopy; Laparoscopic incisional / umbilical / ventral hernia repair (03/15/2014); Port-a-cath removal (03/15/2014); Hernia repair; Cardiac catheterization (2002); Incisional hernia repair (N/A, 03/15/2014); Port-a-cath removal (Left, 03/15/2014); Insertion of mesh (N/A, 03/15/2014); RIGHT/LEFT HEART CATH AND CORONARY/GRAFT ANGIOGRAPHY (N/A, 05/10/2017); Coronary artery bypass graft; and Small intestine surgery.   His family history includes Arthritis in his brother; Depression in his brother; Diabetes in his brother, brother, and mother; Heart disease in his brother and brother; Hyperlipidemia in his brother and brother; Hypertension in his brother, brother, and father; Thyroid disease in his sister.He reports that he has quit smoking. His smoking use included cigars. He quit after 40.00 years of use. He has never used smokeless tobacco. He reports that he does not drink alcohol or use drugs.  Current Outpatient Medications on File Prior to Visit  Medication Sig Dispense Refill  . aspirin 81 MG chewable tablet Chew 81 mg by mouth daily.    Marland Kitchen glucose blood (ONETOUCH VERIO) test strip Test BS BID Dx E11.9 200 each 3  . Misc Natural Products (OSTEO BI-FLEX ADV TRIPLE ST) TABS Take 500 each by mouth daily.     . nitroGLYCERIN (NITROSTAT) 0.4 MG SL tablet Place 0.4 mg under the tongue every 5 (five) minutes as needed for chest pain.    . pravastatin (PRAVACHOL) 80 MG tablet TAKE 1 TABLET BY MOUTH DAILY EVERY  EVENING 90 tablet 3  . triamcinolone cream (KENALOG) 0.1 % Apply 1  application topically 2 (two) times daily. Avoid face and genitalia 45 g 2  . VIAGRA 100 MG tablet Take 50 mg by mouth as needed.   3   No current facility-administered medications on file prior to visit.     ROS Review of Systems  Constitutional: Negative.   HENT: Negative.   Eyes: Negative for visual disturbance.  Respiratory: Negative for cough and shortness of breath.   Cardiovascular: Negative for chest pain and leg swelling.  Gastrointestinal: Negative for abdominal pain, diarrhea, nausea and vomiting.  Genitourinary: Negative for difficulty urinating.  Musculoskeletal: Negative for arthralgias and myalgias.  Skin: Negative for rash.  Neurological: Negative for headaches.  Psychiatric/Behavioral: Negative for sleep disturbance.    Objective:  BP 138/72   Pulse 81   Temp (!) 96.6 F (35.9 C) (Temporal)   Ht 5\' 9"  (1.753 m)   Wt 216 lb 11.2 oz (98.3 kg)   BMI 32.00 kg/m   BP Readings from Last 3 Encounters:  04/13/19 138/72  01/14/19 131/65  10/13/18 133/63    Wt Readings from Last 3 Encounters:  04/13/19 216 lb 11.2 oz (98.3 kg)  01/14/19 219 lb (99.3 kg)  10/13/18 228 lb 8 oz (103.6 kg)     Physical Exam Constitutional:      General: He is not in acute distress.    Appearance: He is well-developed.  HENT:     Head: Normocephalic and atraumatic.     Right Ear: External ear normal.     Left Ear: External ear normal.     Nose: Nose normal.  Eyes:     Conjunctiva/sclera: Conjunctivae normal.     Pupils: Pupils are equal, round, and reactive to light.  Neck:     Musculoskeletal: Normal range of motion and neck supple.  Cardiovascular:     Rate and Rhythm: Normal rate and regular rhythm.     Heart sounds: Normal heart sounds. No murmur.  Pulmonary:     Effort: Pulmonary effort is normal. No respiratory distress.     Breath sounds: Normal breath sounds. No wheezing or rales.  Abdominal:      Palpations: Abdomen is soft.     Tenderness: There is no abdominal tenderness.  Musculoskeletal: Normal range of motion.  Skin:    General: Skin is warm and dry.  Neurological:     Mental Status: He is alert and oriented to person, place, and time.     Deep Tendon Reflexes: Reflexes are normal and symmetric.  Psychiatric:        Behavior: Behavior normal.        Thought Content: Thought content normal.        Judgment: Judgment normal.     Diabetic Foot Exam - Simple   Simple Foot Form Diabetic Foot exam was performed with the following findings: Yes 04/13/2019  8:53 AM  Visual Inspection No deformities, no ulcerations, no other skin breakdown bilaterally: Yes See comments: Yes Sensation Testing Intact to touch and monofilament testing bilaterally: Yes Pulse Check Posterior Tibialis and Dorsalis pulse intact bilaterally: Yes Comments    Results for orders placed or performed in visit on 04/13/19  CoaguChek XS/INR Waived  Result Value Ref Range   INR 3.2 (H) 0.9 - 1.1   Prothrombin Time 39.0 sec      Assessment & Plan:   John [name] was seen today for coagulation disorder and diabetes.  Diagnoses and all orders for this visit:  Long term (current) use of anticoagulants -  CoaguChek XS/INR Waived  Longstanding persistent atrial fibrillation  Type 2 diabetes mellitus without complication, with long-term current use of insulin (HCC)  Essential hypertension  Hyperlipidemia, unspecified hyperlipidemia type  Other orders -     dexlansoprazole (DEXILANT) 60 MG capsule; Take 1 capsule (60 mg total) by mouth daily. -     furosemide (LASIX) 40 MG tablet; Take 1 tablet (40 mg total) by mouth daily. -     glipiZIDE (GLUCOTROL XL) 10 MG 24 hr tablet; Take 1 tablet (10 mg total) by mouth daily. -     metFORMIN (GLUCOPHAGE) 500 MG tablet; Take 1 tablet (500 mg total) by mouth 2 (two) times daily with a meal. -     insulin glargine (LANTUS) 100 UNIT/ML injection; Inject 0.85  mLs (85 Units total) into the skin 2 (two) times daily as needed (takes 85 units every night at bedtime 2 nd dose only if needed for elevated blood sugars). -     metoprolol (TOPROL-XL) 200 MG 24 hr tablet; Take 1 tablet (200 mg total) by mouth daily after breakfast. -     sacubitril-valsartan (ENTRESTO) 49-51 MG; Take 1 tablet by mouth 2 (two) times daily. -     sitaGLIPtin (JANUVIA) 50 MG tablet; Take 1 tablet (50 mg total) by mouth daily. -     spironolactone (ALDACTONE) 25 MG tablet; Take 1 tablet (25 mg total) by mouth daily. -     warfarin (COUMADIN) 5 MG tablet; Take 1 tablet (5 mg total) by mouth daily.   I am having John [name] maintain his Osteo Bi-Flex Adv Triple St, nitroGLYCERIN, aspirin, Viagra, triamcinolone cream, glucose blood, pravastatin, Dexilant, furosemide, glipiZIDE, metFORMIN, insulin glargine, metoprolol, Entresto, sitaGLIPtin, spironolactone, and warfarin.  Meds ordered this encounter  Medications  . dexlansoprazole (DEXILANT) 60 MG capsule    Sig: Take 1 capsule (60 mg total) by mouth daily.    Dispense:  90 capsule    Refill:  1  . furosemide (LASIX) 40 MG tablet    Sig: Take 1 tablet (40 mg total) by mouth daily.    Dispense:  90 tablet    Refill:  1  . glipiZIDE (GLUCOTROL XL) 10 MG 24 hr tablet    Sig: Take 1 tablet (10 mg total) by mouth daily.    Dispense:  90 tablet    Refill:  1  . metFORMIN (GLUCOPHAGE) 500 MG tablet    Sig: Take 1 tablet (500 mg total) by mouth 2 (two) times daily with a meal.    Dispense:  180 tablet    Refill:  1  . insulin glargine (LANTUS) 100 UNIT/ML injection    Sig: Inject 0.85 mLs (85 Units total) into the skin 2 (two) times daily as needed (takes 85 units every night at bedtime 2 nd dose only if needed for elevated blood sugars).    Dispense:  10 mL    Refill:  5  . metoprolol (TOPROL-XL) 200 MG 24 hr tablet    Sig: Take 1 tablet (200 mg total) by mouth daily after breakfast.    Dispense:  90 tablet    Refill:  3   . sacubitril-valsartan (ENTRESTO) 49-51 MG    Sig: Take 1 tablet by mouth 2 (two) times daily.    Dispense:  180 tablet    Refill:  1  . sitaGLIPtin (JANUVIA) 50 MG tablet    Sig: Take 1 tablet (50 mg total) by mouth daily.    Dispense:  90 tablet  Refill:  1  . spironolactone (ALDACTONE) 25 MG tablet    Sig: Take 1 tablet (25 mg total) by mouth daily.    Dispense:  90 tablet    Refill:  1  . warfarin (COUMADIN) 5 MG tablet    Sig: Take 1 tablet (5 mg total) by mouth daily.    Dispense:  90 tablet    Refill:  1     Follow-up: Return in about 1 month (around 05/14/2019) for A fib.  Claretta Fraise, M.D.

## 2019-04-17 ENCOUNTER — Encounter: Payer: Self-pay | Admitting: Family Medicine

## 2019-05-05 NOTE — Progress Notes (Signed)
HPI The patient presents for follow of MVR and atrial fib.  In April 2018 an echo demonstrated a stable MVR and stable mild aortic valve disease.  However, the EF was moderately reduced.  His EF at the previous echo was 50 although it had been reduced in the more distant past.  It was now 30 - 35% in Oct of last year.  I sent him for a Lexiscan Myoview in 2018.  He had a reduced EF with a new anterior wall perfusion defect that was not present on the previous perfusion study.  He underwent cardiac cath.  He had an occluded SVG to the RCA but flow through the native vessel.  The LAD was occluded but with good flow through the LIMA.  He is now being treated with Entresto.    Since I last saw him he has been doing OK.  The patient denies any new symptoms such as chest discomfort, neck or arm discomfort. There has been no new shortness of breath, PND or orthopnea. There have been no reported palpitations, presyncope or syncope.   Allergies  Allergen Reactions  . Doxycycline Hives    Current Outpatient Medications  Medication Sig Dispense Refill  . aspirin 81 MG chewable tablet Chew 81 mg by mouth daily.    Marland Kitchen dexlansoprazole (DEXILANT) 60 MG capsule Take 1 capsule (60 mg total) by mouth daily. 90 capsule 1  . furosemide (LASIX) 40 MG tablet Take 1 tablet (40 mg total) by mouth daily. 90 tablet 1  . insulin glargine (LANTUS) 100 UNIT/ML injection Inject 0.85 mLs (85 Units total) into the skin 2 (two) times daily as needed (takes 85 units every night at bedtime 2 nd dose only if needed for elevated blood sugars). 10 mL 5  . metFORMIN (GLUCOPHAGE) 500 MG tablet Take 1 tablet (500 mg total) by mouth 2 (two) times daily with a meal. 180 tablet 1  . metoprolol (TOPROL-XL) 200 MG 24 hr tablet Take 1 tablet (200 mg total) by mouth daily after breakfast. 90 tablet 3  . Misc Natural Products (OSTEO BI-FLEX ADV TRIPLE ST) TABS Take 500 each by mouth daily.     . nitroGLYCERIN (NITROSTAT) 0.4 MG SL tablet  Place 0.4 mg under the tongue every 5 (five) minutes as needed for chest pain.    . pravastatin (PRAVACHOL) 80 MG tablet TAKE 1 TABLET BY MOUTH DAILY EVERY EVENING 90 tablet 3  . sacubitril-valsartan (ENTRESTO) 49-51 MG Take 1 tablet by mouth 2 (two) times daily. 180 tablet 1  . sitaGLIPtin (JANUVIA) 50 MG tablet Take 1 tablet (50 mg total) by mouth daily. 90 tablet 1  . spironolactone (ALDACTONE) 25 MG tablet Take 1 tablet (25 mg total) by mouth daily. 90 tablet 1  . triamcinolone cream (KENALOG) 0.1 % Apply 1 application topically 2 (two) times daily. Avoid face and genitalia 45 g 2  . VIAGRA 100 MG tablet Take 50 mg by mouth as needed.   3  . warfarin (COUMADIN) 5 MG tablet Take 1 tablet (5 mg total) by mouth daily. 90 tablet 1  . glipiZIDE (GLUCOTROL XL) 10 MG 24 hr tablet Take 1 tablet (10 mg total) by mouth daily. 90 tablet 1  . glucose blood (ONETOUCH VERIO) test strip Test BS BID Dx E11.9 200 each 3   No current facility-administered medications for this visit.     Past Medical History:  Diagnosis Date  . Allergy   . Anemia    "lost blood w/the  cancer"  . Arthritis    back   . Atrial fibrillation ([place])    takes Coumadin daily  . Bruises easily    takes Coumadin daily  . CAD (coronary artery disease)    a. s/p CABG 11/2000 (L-LAD, S-RCA);  b. Lexiscan Myoview 11/12: EF 45%, ischemia and possibly some scar at the base of the inferolateral wall;   c. Lex MV 11/13:  EF 44%, Inf and IL scar/soft tissue atten, small mild amt of inf ischemia,  . CHF (congestive heart failure) ([place])   . Constipation    takes Colace daily  . Enlarged prostate   . Flu 09/2013  . GERD (gastroesophageal reflux disease)    takes Nexium daily  . Glaucoma   . History of blood transfusion    "related to cancer"  . History of colon polyps   . HLD (hyperlipidemia)    takes Vytorin daily  . HTN (hypertension)    takes Amlodipine,Metoprolol, and Ramipril daily  . Insomnia    states he has always been  like this.But doesn't take any meds  . Kidney stones    "I passed them all"  . Nocturia   . Peripheral edema    takes Furosemide daily  . Peripheral neuropathy   . Rheumatic fever ~ 1965  . Rheumatic heart disease    a. s/p mechanical (St. Jude) MVR 2002;  b. Echo 5/11: Mild LVH, EF 45-50%, mild AS, mild AI, mean gradient 11 mmHg, MVR with normal gradients, moderate LAE, mild RAE;  c.  Echo 11/13:   mod LVH, EF 55-60%, mild AS (mean 18 mmHg), mild AI, severe LAE, mild RAE, PASP 41  . 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 SOB (shortness of breath)    with exertion  . Type II diabetes mellitus ([place])    takes Januvia,Glipizide,and Metformin daily as well as Lantus    Past Surgical History:  Procedure Laterality Date  . APPLICATION OF WOUND VAC  2014  . CARDIAC CATHETERIZATION  2002  . COLONOSCOPY    . CORONARY ARTERY BYPASS GRAFT  2002   x 3  . CORONARY ARTERY BYPASS GRAFT    . ESOPHAGOGASTRODUODENOSCOPY    . HERNIA REPAIR    . INCISIONAL HERNIA REPAIR N/A 03/15/2014   Procedure: LAPAROSCOPIC INCISIONAL HERNIA;  Surgeon: Harl Bowie, MD;  Location: [facility];  Service: General;  Laterality: N/A;  . INSERTION OF MESH N/A 03/15/2014   Procedure: INSERTION OF MESH;  Surgeon: Harl Bowie, MD;  Location: [facility];  Service: General;  Laterality: N/A;  . LAPAROSCOPIC INCISIONAL / UMBILICAL / [place]  03/15/2014   IHR  . LAPAROTOMY N/A 02/09/2013   Procedure: EXPLORATORY LAPAROTOMY WITH incisional biopsy intra- ABDOMINAL MASS ILOCECTOMY ;  Surgeon: Harl Bowie, MD;  Location: [facility];  Service: General;  Laterality: N/A;  . MITRAL VALVE REPLACEMENT  11/2000   #33 St. Jude mechanical mitral valve prosthesis. Dr. Servando Snare  . PORT-A-CATH REMOVAL  03/15/2014  . PORT-A-CATH REMOVAL Left 03/15/2014   Procedure: REMOVAL PORT-A-CATH;  Surgeon: Harl Bowie, MD;  Location: [facility];  Service: General;  Laterality: Left;  . PORTACATH  PLACEMENT N/A 02/09/2013   Procedure: INSERTION PORT-A-CATH;  Surgeon: Harl Bowie, MD;  Location: [facility];  Service: General;  Laterality: N/A;  . RIGHT/LEFT HEART CATH AND CORONARY/GRAFT ANGIOGRAPHY N/A 05/10/2017   Procedure: RIGHT/LEFT HEART CATH AND CORONARY/GRAFT ANGIOGRAPHY;  Surgeon: Martinique, Peter M, MD;  Location: [facility];  Service: Cardiovascular;  Laterality: N/A;  . SEPTOPLASTY    . SMALL INTESTINE SURGERY      ROS: As stated in the HPI and negative for all other systems.   PHYSICAL EXAM BP (!) 132/58   Pulse 79   Ht 5\' 11"  (1.803 m)   Wt 216 lb (98 kg)   BMI 30.13 kg/m   GENERAL:  Well appearing NECK:  No jugular venous distention, waveform within normal limits, carotid upstroke brisk and symmetric, no bruits, no thyromegaly LUNGS:  Clear to auscultation bilaterally CHEST:  Well healed sternotomy scar. HEART:  PMI not displaced or sustained, mechanical S1 and S2 within normal limits, no S3, no clicks, no rubs,  murmurs ABD:  Flat, positive bowel sounds normal in frequency in pitch, no bruits, no rebound, no guarding, no midline pulsatile mass, no hepatomegaly, no splenomegaly EXT:  2 plus pulses throughout, no edema, no cyanosis no clubbing   EKG: Atrial fibrillation, rate 79, right bundle branch block, no acute ST-T wave changes, nonspecific ST flattening.  No change from previous. 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   I had a long discussion with him today about the defibrillator.  He would be interested in this.  However, he like to wait and see if he has a response to an increased dose of Entresto.  I am going to maximize this dose.  Unfortunately we have to wait until he is out of the donut hole to go up on that in January.  So he will be given a prescription.  We will have him start this in January.  In April I will check an echocardiogram and if he still below 35% he would consent to EP referral.   CAD - The  patient has no new sypmtoms.  No change in therapy.  ATRIAL FIBRILLATION - He tolerates anticoagulation and rate control.  MECHANICAL MVR -  He had stable valve function in Oct of last year.  I will continue to follow this clinically.   AORTIC VALVE STENOSIS:   I will continue to follow this clinically as well.

## 2019-05-06 ENCOUNTER — Ambulatory Visit: Payer: PPO | Admitting: Cardiology

## 2019-05-06 ENCOUNTER — Other Ambulatory Visit: Payer: Self-pay

## 2019-05-06 ENCOUNTER — Encounter: Payer: Self-pay | Admitting: Cardiology

## 2019-05-06 VITALS — BP 132/58 | HR 79 | Ht 71.0 in | Wt 216.0 lb

## 2019-05-06 DIAGNOSIS — I5022 Chronic systolic (congestive) heart failure: Secondary | ICD-10-CM

## 2019-05-06 DIAGNOSIS — I482 Chronic atrial fibrillation, unspecified: Secondary | ICD-10-CM

## 2019-05-06 DIAGNOSIS — I2510 Atherosclerotic heart disease of native coronary artery without angina pectoris: Secondary | ICD-10-CM

## 2019-05-06 DIAGNOSIS — I10 Essential (primary) hypertension: Secondary | ICD-10-CM

## 2019-05-06 NOTE — Patient Instructions (Signed)
Medication Instructions:  Continue current medications as listed for now.  When you are able Dr Percival Spanish wants you to increase Entresto to 97-103 mg twice a day.  Please let me know when you are ready to do that.  Once you have been taking the increased dose for 3 months, Dr Percival Spanish wants you to have a 2 D Echo.  If you need a refill on your cardiac medications before your next appointment, please call your pharmacy.   Testing/Procedures: Your physician has requested that you have an echocardiogram (after being on increased dose of Entresto for 3 months). Echocardiography is a painless test that uses sound waves to create images of your heart. It provides your doctor with information about the size and shape of your heart and how well your heart's chambers and valves are working. This procedure takes approximately one hour. There are no restrictions for this procedure.  Follow-Up: Follow up in 1 year with Dr. Percival Spanish.  You will receive a letter in the mail 2 months before you are due.  Please call us when you receive this letter to schedule your follow up appointment.  Thank you for choosing [facility]!!

## 2019-05-19 ENCOUNTER — Ambulatory Visit (INDEPENDENT_AMBULATORY_CARE_PROVIDER_SITE_OTHER): Payer: PPO | Admitting: Family Medicine

## 2019-05-19 ENCOUNTER — Encounter: Payer: Self-pay | Admitting: Family Medicine

## 2019-05-19 ENCOUNTER — Other Ambulatory Visit: Payer: Self-pay

## 2019-05-19 VITALS — BP 123/69 | HR 77 | Temp 96.8°F | Ht 71.0 in | Wt 213.0 lb

## 2019-05-19 DIAGNOSIS — Z7901 Long term (current) use of anticoagulants: Secondary | ICD-10-CM

## 2019-05-19 DIAGNOSIS — Z794 Long term (current) use of insulin: Secondary | ICD-10-CM | POA: Diagnosis not present

## 2019-05-19 DIAGNOSIS — E119 Type 2 diabetes mellitus without complications: Secondary | ICD-10-CM

## 2019-05-19 LAB — COAGUCHEK XS/INR WAIVED
INR: 4.2 — ABNORMAL HIGH (ref 0.9–1.1)
Prothrombin Time: 50.5 s

## 2019-05-19 NOTE — Progress Notes (Signed)
Subjective:  Patient ID: John [name], male    DOB: [birth year]  Age: 68 y.o. MRN: [id number]  CC: Coagulation Disorder   HPI John [name] presents for Patient in for follow-up of atrial fibrillation. Patient denies any recent bouts of chest pain or palpitations. Additionally, patient is taking anticoagulants. Patient denies any recent excessive bleeding episodes including epistaxis, bleeding from the gums, genitalia, rectal bleeding or hematuria. Additionally there has been no excessive bruising. Pt. Decreased his intake of broccoli.   Follow-up of diabetes. Patient does check blood sugar at home Running mid 100s Patient denies symptoms such as polyuria, polydipsia, excessive hunger, nausea No significant hypoglycemic spells noted. Medications as noted below. Taking them regularly without complication/adverse reaction being reported today.  Depression screen [facility] 2/9 05/19/2019 04/13/2019 10/13/2018  Decreased Interest 0 0 0  Down, Depressed, Hopeless 0 0 0  PHQ - 2 Score 0 0 0  Altered sleeping - - -  Tired, decreased energy - - -  Change in appetite - - -  Feeling bad or failure about yourself  - - -  Trouble concentrating - - -  Moving slowly or fidgety/restless - - -  Suicidal thoughts - - -  PHQ-9 Score - - -    History John [name] has a past medical history of Allergy, Anemia, Arthritis, Atrial fibrillation ([place]), Bruises easily, CAD (coronary artery disease), CHF (congestive heart failure) ([place]), Constipation, Enlarged prostate, Flu (09/2013), GERD (gastroesophageal reflux disease), Glaucoma, History of blood transfusion, History of colon polyps, HLD (hyperlipidemia), HTN (hypertension), Insomnia, Kidney stones, Nocturia, Peripheral edema, Peripheral neuropathy, Rheumatic fever (~ 1965), Rheumatic heart disease, S/P 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   He has a past surgical  history that includes Mitral valve replacement (11/2000); laparotomy (N/A, 02/09/2013); Portacath placement (N/A, 02/09/2013); Coronary artery bypass graft (2002); Septoplasty; Application if wound vac (2014); Colonoscopy; Esophagogastroduodenoscopy; Laparoscopic incisional / umbilical / ventral hernia repair (03/15/2014); Port-a-cath removal (03/15/2014); Hernia repair; Cardiac catheterization (2002); Incisional hernia repair (N/A, 03/15/2014); Port-a-cath removal (Left, 03/15/2014); Insertion of mesh (N/A, 03/15/2014); RIGHT/LEFT HEART CATH AND CORONARY/GRAFT ANGIOGRAPHY (N/A, 05/10/2017); Coronary artery bypass graft; and Small intestine surgery.   His family history includes Arthritis in his brother; Depression in his brother; Diabetes in his brother, brother, and mother; Heart disease in his brother and brother; Hyperlipidemia in his brother and brother; Hypertension in his brother, brother, and father; Thyroid disease in his sister.He reports that he has quit smoking. His smoking use included cigars. He quit after 40.00 years of use. He has never used smokeless tobacco. He reports that he does not drink alcohol or use drugs.    ROS Review of Systems  Constitutional: Negative for fever.  Respiratory: Negative for shortness of breath.   Cardiovascular: Negative for chest pain.  Musculoskeletal: Negative for arthralgias.  Skin: Negative for rash.    Objective:  BP 123/69   Pulse 77   Temp (!) 96.8 F (36 C) (Temporal)   Ht 5\' 11"  (1.803 m)   Wt 213 lb (96.6 kg)   BMI 29.71 kg/m   BP Readings from Last 3 Encounters:  05/19/19 123/69  05/06/19 (!) 132/58  04/13/19 138/72    Wt Readings from Last 3 Encounters:  05/19/19 213 lb (96.6 kg)  05/06/19 216 lb (98 kg)  04/13/19 216 lb 11.2 oz (98.3 kg)     Physical Exam Vitals signs reviewed.  Constitutional:  Appearance: He is well-developed.  HENT:     Head: Normocephalic and atraumatic.     Right Ear: External ear normal.     Left Ear:  External ear normal.     Mouth/Throat:     Pharynx: No oropharyngeal exudate or posterior oropharyngeal erythema.  Eyes:     Pupils: Pupils are equal, round, and reactive to light.  Neck:     Musculoskeletal: Normal range of motion and neck supple.  Cardiovascular:     Rate and Rhythm: Normal rate and regular rhythm.     Heart sounds: No murmur.  Pulmonary:     Effort: No respiratory distress.     Breath sounds: Normal breath sounds.  Neurological:     Mental Status: He is alert and oriented to person, place, and time.    Results for orders placed or performed in visit on 05/19/19  CoaguChek XS/INR Waived  Result Value Ref Range   INR 4.2 (H) 0.9 - 1.1   Prothrombin Time 50.5 sec      Assessment & Plan:   John [name] was seen today for coagulation disorder.  Diagnoses and all orders for this visit:  Long term (current) use of anticoagulants -     Protime-INR -     CoaguChek XS/INR Waived  Type 2 diabetes mellitus without complication, with long-term current use of insulin ([place])       I am having John [name] maintain his Osteo Bi-Flex Adv Triple St, nitroGLYCERIN, aspirin, Viagra, triamcinolone cream, glucose blood, pravastatin, Dexilant, furosemide, glipiZIDE, metFORMIN, insulin glargine, metoprolol, Entresto, sitaGLIPtin, spironolactone, and warfarin.  Allergies as of 05/19/2019      Reactions   Doxycycline Hives      Medication List       Accurate as of May 19, 2019  9:01 PM. If you have any questions, ask your nurse or doctor.        aspirin 81 MG chewable tablet Chew 81 mg by mouth daily.   Dexilant 60 MG capsule Generic drug: dexlansoprazole Take 1 capsule (60 mg total) by mouth daily.   Entresto 49-51 MG Generic drug: sacubitril-valsartan Take 1 tablet by mouth 2 (two) times daily.   furosemide 40 MG tablet Commonly known as: LASIX Take 1 tablet (40 mg total) by mouth daily.   glipiZIDE 10 MG 24 hr tablet Commonly known as: GLUCOTROL  XL Take 1 tablet (10 mg total) by mouth daily.   glucose blood test strip Commonly known as: OneTouch Verio Test BS BID Dx E11.9   insulin glargine 100 UNIT/ML injection Commonly known as: LANTUS Inject 0.85 mLs (85 Units total) into the skin 2 (two) times daily as needed (takes 85 units every night at bedtime 2 nd dose only if needed for elevated blood sugars).   metFORMIN 500 MG tablet Commonly known as: GLUCOPHAGE Take 1 tablet (500 mg total) by mouth 2 (two) times daily with a meal.   metoprolol 200 MG 24 hr tablet Commonly known as: TOPROL-XL Take 1 tablet (200 mg total) by mouth daily after breakfast.   nitroGLYCERIN 0.4 MG SL tablet Commonly known as: NITROSTAT Place 0.4 mg under the tongue every 5 (five) minutes as needed for chest pain.   Osteo Bi-Flex Adv Triple St Tabs Take 500 each by mouth daily.   pravastatin 80 MG tablet Commonly known as: PRAVACHOL TAKE 1 TABLET BY MOUTH DAILY EVERY EVENING   sitaGLIPtin 50 MG tablet Commonly known as: JANUVIA Take 1 tablet (50 mg total) by mouth daily.  spironolactone 25 MG tablet Commonly known as: ALDACTONE Take 1 tablet (25 mg total) by mouth daily.   triamcinolone cream 0.1 % Commonly known as: KENALOG Apply 1 application topically 2 (two) times daily. Avoid face and genitalia   Viagra 100 MG tablet Generic drug: sildenafil Take 50 mg by mouth as needed.   warfarin 5 MG tablet Commonly known as: COUMADIN Take as directed by the anticoagulation clinic. If you are unsure how to take this medication, talk to your nurse or doctor. Original instructions: Take 1 tablet (5 mg total) by mouth daily.       Pt. Will resume eating more greens. Report any bleeding or head injuries  Follow-up: Return in about 1 week (around 05/26/2019).  Claretta Fraise, M.D.

## 2019-05-25 ENCOUNTER — Encounter: Payer: Self-pay | Admitting: Family Medicine

## 2019-05-25 ENCOUNTER — Other Ambulatory Visit: Payer: Self-pay

## 2019-05-25 ENCOUNTER — Ambulatory Visit (INDEPENDENT_AMBULATORY_CARE_PROVIDER_SITE_OTHER): Payer: PPO | Admitting: Family Medicine

## 2019-05-25 VITALS — BP 136/67 | HR 85 | Temp 96.4°F | Resp 16 | Ht 71.0 in | Wt 214.0 lb

## 2019-05-25 DIAGNOSIS — Z7901 Long term (current) use of anticoagulants: Secondary | ICD-10-CM | POA: Diagnosis not present

## 2019-05-25 LAB — COAGUCHEK XS/INR WAIVED
INR: 2.4 — ABNORMAL HIGH (ref 0.9–1.1)
Prothrombin Time: 29 s

## 2019-05-25 LAB — PROTIME-INR: INR: 2.4 — AB (ref 0.9–1.1)

## 2019-05-25 NOTE — Progress Notes (Signed)
Subjective:  Patient ID: John [name], male    DOB: Jul 15, 1951  Age: 68 y.o. MRN: [id number]  CC: Coagulation Disorder (1 week follow up)   HPI John [name] presents for INR. Was high end last time. John [name] started eating more greens and it has dropped toward bottom of range. (2.4) John [name] denies excessive bruising, bleeding, chest pain and dyspnea. Working with Dr. Percival Spanish on adjusting entresto.   Depression screen [facility] 2/9 05/25/2019 05/19/2019 04/13/2019  Decreased Interest 0 0 0  Down, Depressed, Hopeless 0 0 0  PHQ - 2 Score 0 0 0  Altered sleeping - - -  Tired, decreased energy - - -  Change in appetite - - -  Feeling bad or failure about yourself  - - -  Trouble concentrating - - -  Moving slowly or fidgety/restless - - -  Suicidal thoughts - - -  PHQ-9 Score - - -    History Elkin has a past medical history of Allergy, Anemia, Arthritis, Atrial fibrillation ([place]), Bruises easily, CAD (coronary artery disease), CHF (congestive heart failure) ([place]), Constipation, Enlarged prostate, Flu (09/2013), GERD (gastroesophageal reflux disease), Glaucoma, History of blood transfusion, History of colon polyps, HLD (hyperlipidemia), HTN (hypertension), Insomnia, Kidney stones, Nocturia, Peripheral edema, Peripheral neuropathy, Rheumatic fever (~ 1965), Rheumatic heart disease, S/P MVR (mitral valve replacement) (11/2000), 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   John [name] has a past surgical history that includes Mitral valve replacement (11/2000); laparotomy (N/A, 02/09/2013); Portacath placement (N/A, 02/09/2013); Coronary artery bypass graft (2002); Septoplasty; Application if wound vac (2014); Colonoscopy; Esophagogastroduodenoscopy; Laparoscopic incisional / umbilical / ventral hernia repair (03/15/2014); Port-a-cath removal (03/15/2014); Hernia repair; Cardiac catheterization (2002); Incisional hernia repair (N/A, 03/15/2014); Port-a-cath removal (Left,  03/15/2014); Insertion of mesh (N/A, 03/15/2014); RIGHT/LEFT HEART CATH AND CORONARY/GRAFT ANGIOGRAPHY (N/A, 05/10/2017); Coronary artery bypass graft; and Small intestine surgery.   His family history includes Arthritis in his brother; Depression in his brother; Diabetes in his brother, brother, and mother; Heart disease in his brother and brother; Hyperlipidemia in his brother and brother; Hypertension in his brother, brother, and father; Thyroid disease in his sister.John [name] reports that John [name] has quit smoking. His smoking use included cigars. John [name] quit after 40.00 years of use. John [name] has never used smokeless tobacco. John [name] reports that John [name] does not drink alcohol or use drugs.    ROS Review of Systems  Constitutional: Negative for fever.  Respiratory: Negative for shortness of breath.   Cardiovascular: Negative for chest pain.  Musculoskeletal: Negative for arthralgias.  Skin: Negative for rash.    Objective:  BP 136/67   Pulse 85   Temp (!) 96.4 F (35.8 C) (Temporal)   Resp 16   Ht 5\' 11"  (1.803 m)   Wt 214 lb (97.1 kg)   SpO2 100%   BMI 29.85 kg/m   BP Readings from Last 3 Encounters:  05/25/19 136/67  05/19/19 123/69  05/06/19 (!) 132/58    Wt Readings from Last 3 Encounters:  05/25/19 214 lb (97.1 kg)  05/19/19 213 lb (96.6 kg)  05/06/19 216 lb (98 kg)     Physical Exam    Assessment & Plan:   John [name] was seen today for coagulation disorder.  Diagnoses and all orders for this visit:  Long term (current) use of anticoagulants -     CoaguChek XS/INR Waived  Other orders -     Protime-INR  RE Coumadin:  Description   Continue taking 5mg  every  day.Keep Vitamin K containing foods at current portion size and number          I am having John [name] maintain his Osteo Bi-Flex Adv Triple St, nitroGLYCERIN, aspirin, Viagra, triamcinolone cream, glucose blood, pravastatin, Dexilant, furosemide, glipiZIDE, metFORMIN, insulin glargine, metoprolol, Entresto, sitaGLIPtin,  spironolactone, and warfarin.  Allergies as of 05/25/2019      Reactions   Doxycycline Hives      Medication List       Accurate as of May 25, 2019 11:59 PM. If you have any questions, ask your nurse or doctor.        aspirin 81 MG chewable tablet Chew 81 mg by mouth daily.   Dexilant 60 MG capsule Generic drug: dexlansoprazole Take 1 capsule (60 mg total) by mouth daily.   Entresto 49-51 MG Generic drug: sacubitril-valsartan Take 1 tablet by mouth 2 (two) times daily.   furosemide 40 MG tablet Commonly known as: LASIX Take 1 tablet (40 mg total) by mouth daily.   glipiZIDE 10 MG 24 hr tablet Commonly known as: GLUCOTROL XL Take 1 tablet (10 mg total) by mouth daily.   glucose blood test strip Commonly known as: OneTouch Verio Test BS BID Dx E11.9   insulin glargine 100 UNIT/ML injection Commonly known as: LANTUS Inject 0.85 mLs (85 Units total) into the skin 2 (two) times daily as needed (takes 85 units every night at bedtime 2 nd dose only if needed for elevated blood sugars).   metFORMIN 500 MG tablet Commonly known as: GLUCOPHAGE Take 1 tablet (500 mg total) by mouth 2 (two) times daily with a meal.   metoprolol 200 MG 24 hr tablet Commonly known as: TOPROL-XL Take 1 tablet (200 mg total) by mouth daily after breakfast.   nitroGLYCERIN 0.4 MG SL tablet Commonly known as: NITROSTAT Place 0.4 mg under the tongue every 5 (five) minutes as needed for chest pain.   Osteo Bi-Flex Adv Triple St Tabs Take 500 each by mouth daily.   pravastatin 80 MG tablet Commonly known as: PRAVACHOL TAKE 1 TABLET BY MOUTH DAILY EVERY EVENING   sitaGLIPtin 50 MG tablet Commonly known as: JANUVIA Take 1 tablet (50 mg total) by mouth daily.   spironolactone 25 MG tablet Commonly known as: ALDACTONE Take 1 tablet (25 mg total) by mouth daily.   triamcinolone cream 0.1 % Commonly known as: KENALOG Apply 1 application topically 2 (two) times daily. Avoid face and  genitalia   Viagra 100 MG tablet Generic drug: sildenafil Take 50 mg by mouth as needed.   warfarin 5 MG tablet Commonly known as: COUMADIN Take as directed by the anticoagulation clinic. If you are unsure how to take this medication, talk to your nurse or doctor. Original instructions: Take 1 tablet (5 mg total) by mouth daily.        Follow-up: No follow-ups on file.  Claretta Fraise, M.D.

## 2019-06-01 ENCOUNTER — Encounter: Payer: Self-pay | Admitting: Family Medicine

## 2019-06-12 ENCOUNTER — Ambulatory Visit (INDEPENDENT_AMBULATORY_CARE_PROVIDER_SITE_OTHER): Payer: PPO | Admitting: *Deleted

## 2019-06-12 DIAGNOSIS — Z0000 Encounter for general adult medical examination without abnormal findings: Secondary | ICD-10-CM | POA: Diagnosis not present

## 2019-06-12 NOTE — Patient Instructions (Signed)
Preventive Care 66 Years and Older, Male Preventive care refers to lifestyle choices and visits with your health care provider that can promote health and wellness. This includes:  A yearly physical exam. This is also called an annual well check.  Regular dental and eye exams.  Immunizations.  Screening for certain conditions.  Healthy lifestyle choices, such as diet and exercise. What can I expect for my preventive care visit? Physical exam Your health care provider will check:  Height and weight. These may be used to calculate body mass index (BMI), which is a measurement that tells if you are at a healthy weight.  Heart rate and blood pressure.  Your skin for abnormal spots. Counseling Your health care provider may ask you questions about:  Alcohol, tobacco, and drug use.  Emotional well-being.  Home and relationship well-being.  Sexual activity.  Eating habits.  History of falls.  Memory and ability to understand (cognition).  Work and work Statistician. What immunizations do I need?  Influenza (flu) vaccine  This is recommended every year. Tetanus, diphtheria, and pertussis (Tdap) vaccine  You may need a Td booster every 10 years. Varicella (chickenpox) vaccine  You may need this vaccine if you have not already been vaccinated. Zoster (shingles) vaccine  You may need this after age 50. Pneumococcal conjugate (PCV13) vaccine  One dose is recommended after age 24. Pneumococcal polysaccharide (PPSV23) vaccine  One dose is recommended after age 33. Measles, mumps, and rubella (MMR) vaccine  You may need at least one dose of MMR if you were born in 1957 or later. You may also need a second dose. Meningococcal conjugate (MenACWY) vaccine  You may need this if you have certain conditions. Hepatitis A vaccine  You may need this if you have certain conditions or if you travel or work in places where you may be exposed to hepatitis A. Hepatitis B vaccine   You may need this if you have certain conditions or if you travel or work in places where you may be exposed to hepatitis B. Haemophilus influenzae type b (Hib) vaccine  You may need this if you have certain conditions. You may receive vaccines as individual doses or as more than one vaccine together in one shot (combination vaccines). Talk with your health care provider about the risks and benefits of combination vaccines. What tests do I need? Blood tests  Lipid and cholesterol levels. These may be checked every 5 years, or more frequently depending on your overall health.  Hepatitis C test.  Hepatitis B test. Screening  Lung cancer screening. You may have this screening every year starting at age 66 if you have a 30-pack-year history of smoking and currently smoke or have quit within the past 15 years.  Colorectal cancer screening. All adults should have this screening starting at age 66 and continuing until age 54. Your health care provider may recommend screening at age 66 if you are at increased risk. You will have tests every 1-10 years, depending on your results and the type of screening test.  Prostate cancer screening. Recommendations will vary depending on your family history and other risks.  Diabetes screening. This is done by checking your blood sugar (glucose) after you have not eaten for a while (fasting). You may have this done every 1-3 years.  Abdominal aortic aneurysm (AAA) screening. You may need this if you are a current or former smoker.  Sexually transmitted disease (STD) testing. Follow these instructions at home: Eating and drinking  Eat  a diet that includes fresh fruits and vegetables, whole grains, lean protein, and low-fat dairy products. Limit your intake of foods with high amounts of sugar, saturated fats, and salt.  Take vitamin and mineral supplements as recommended by your health care provider.  Do not drink alcohol if your health care provider  tells you not to drink.  If you drink alcohol: ? Limit how much you have to 0-2 drinks a day. ? Be aware of how much alcohol is in your drink. In the [place] one drink equals one 12 oz bottle of beer (355 mL), one 5 oz glass of wine (148 mL), or one 1 oz glass of hard liquor (44 mL). Lifestyle  Take daily care of your teeth and gums.  Stay active. Exercise for at least 30 minutes on 5 or more days each week.  Do not use any products that contain nicotine or tobacco, such as cigarettes, e-cigarettes, and chewing tobacco. If you need help quitting, ask your health care provider.  If you are sexually active, practice safe sex. Use a condom or other form of protection to prevent STIs (sexually transmitted infections).  Talk with your health care provider about taking a low-dose aspirin or statin. What's next?  Visit your health care provider once a year for a well check visit.  Ask your health care provider how often you should have your eyes and teeth checked.  Stay up to date on all vaccines. This information is not intended to replace advice given to you by your health care provider. Make sure you discuss any questions you have with your health care provider. Document Released: 09/23/2015 Document Revised: 08/21/2018 Document Reviewed: 08/21/2018 Elsevier Patient Education  2020 [employer]  

## 2019-06-12 NOTE — Progress Notes (Addendum)
MEDICARE ANNUAL WELLNESS VISIT  06/12/2019  Telephone Visit Disclaimer This Medicare AWV was conducted by telephone due to national recommendations for restrictions regarding the COVID-19 Pandemic (e.g. social distancing).  I verified, using two identifiers, that I am speaking with John [name] or their authorized healthcare agent. I discussed the limitations, risks, security, and privacy concerns of performing an evaluation and management service by telephone and the potential availability of an in-person appointment in the future. The patient expressed understanding and agreed to proceed.   Subjective:  John [name] is a 68 y.o. male patient of Stacks, Cletus Gash, MD who had a Medicare Annual Wellness Visit today via telephone. Antwann is Disabled and lives with their spouse. he has 1 child. he reports that he is socially active and does interact with friends/family regularly. he is moderately physically active and enjoys playing basketball, shooting pool, walking his dog, working on cars and doing anything outside.  Patient Care Team: Claretta Fraise, MD as PCP - General (Family Medicine) Wyatt Portela, MD as Consulting Physician (Oncology) Minus Breeding, MD as Consulting Physician (Cardiology)  Advanced Directives 06/12/2019 06/10/2018 05/10/2017 01/03/2017 07/05/2016 06/04/2016 06/23/2015  Does Patient Have a Medical Advance Directive? No No No No No No -  Would patient like information on creating a medical advance directive? No - Patient declined Yes (MAU/Ambulatory/Procedural Areas - Information given) Yes (Inpatient - patient requests chaplain consult to create a medical advance directive);No - Patient declined - Yes Higher education careers adviser given - No - patient declined information  Pre-existing out of facility DNR order (yellow form or pink MOST form) - - - - - - -    Hospital Utilization Over the Past 12 Months: # of hospitalizations or ER visits: 0 # of surgeries: 0   Review of Systems    Patient reports that his overall health is unchanged compared to last year.  History obtained from chart review  Patient Reported Readings (BP, Pulse, CBG, Weight, etc) none  Pain Assessment Pain : No/denies pain     Current Medications & Allergies (verified) Allergies as of 06/12/2019      Reactions   Doxycycline Hives      Medication List       Accurate as of June 12, 2019  8:54 AM. If you have any questions, ask your nurse or doctor.        aspirin 81 MG chewable tablet Chew 81 mg by mouth daily.   Dexilant 60 MG capsule Generic drug: dexlansoprazole Take 1 capsule (60 mg total) by mouth daily.   Entresto 49-51 MG Generic drug: sacubitril-valsartan Take 1 tablet by mouth 2 (two) times daily.   furosemide 40 MG tablet Commonly known as: LASIX Take 1 tablet (40 mg total) by mouth daily.   glipiZIDE 10 MG 24 hr tablet Commonly known as: GLUCOTROL XL Take 1 tablet (10 mg total) by mouth daily.   glucose blood test strip Commonly known as: OneTouch Verio Test BS BID Dx E11.9   insulin glargine 100 UNIT/ML injection Commonly known as: LANTUS Inject 0.85 mLs (85 Units total) into the skin 2 (two) times daily as needed (takes 85 units every night at bedtime 2 nd dose only if needed for elevated blood sugars).   metFORMIN 500 MG tablet Commonly known as: GLUCOPHAGE Take 1 tablet (500 mg total) by mouth 2 (two) times daily with a meal.   metoprolol 200 MG 24 hr tablet Commonly known as: TOPROL-XL Take 1 tablet (200 mg total) by  mouth daily after breakfast.   nitroGLYCERIN 0.4 MG SL tablet Commonly known as: NITROSTAT Place 0.4 mg under the tongue every 5 (five) minutes as needed for chest pain.   Osteo Bi-Flex Adv Triple St Tabs Take 500 each by mouth daily.   pravastatin 80 MG tablet Commonly known as: PRAVACHOL TAKE 1 TABLET BY MOUTH DAILY EVERY EVENING   sitaGLIPtin 50 MG tablet Commonly known as: JANUVIA Take 1 tablet (50  mg total) by mouth daily.   spironolactone 25 MG tablet Commonly known as: ALDACTONE Take 1 tablet (25 mg total) by mouth daily.   triamcinolone cream 0.1 % Commonly known as: KENALOG Apply 1 application topically 2 (two) times daily. Avoid face and genitalia   Viagra 100 MG tablet Generic drug: sildenafil Take 50 mg by mouth as needed.   warfarin 5 MG tablet Commonly known as: COUMADIN Take as directed by the anticoagulation clinic. If you are unsure how to take this medication, talk to your nurse or doctor. Original instructions: Take 1 tablet (5 mg total) by mouth daily.       History (reviewed): Past Medical History:  Diagnosis Date  . Allergy   . Anemia    "lost blood w/the cancer"  . Arthritis    back   . Atrial fibrillation ([place])    takes Coumadin daily  . Bruises easily    takes Coumadin daily  . CAD (coronary artery disease)    a. s/p CABG 11/2000 (L-LAD, S-RCA);  b. Lexiscan Myoview 11/12: EF 45%, ischemia and possibly some scar at the base of the inferolateral wall;   c. Lex MV 11/13:  EF 44%, Inf and IL scar/soft tissue atten, small mild amt of inf ischemia,  . CHF (congestive heart failure) ([place])   . Constipation    takes Colace daily  . Enlarged prostate   . Flu 09/2013  . GERD (gastroesophageal reflux disease)    takes Nexium daily  . Glaucoma   . History of blood transfusion    "related to cancer"  . History of colon polyps   . HLD (hyperlipidemia)    takes Vytorin daily  . HTN (hypertension)    takes Amlodipine,Metoprolol, and Ramipril daily  . Insomnia    states he has always been like this.But doesn't take any meds  . Kidney stones    "I passed them all"  . Nocturia   . Peripheral edema    takes Furosemide daily  . Peripheral neuropathy   . Rheumatic fever ~ 1965  . Rheumatic heart disease    a. s/p mechanical (St. Jude) MVR 2002;  b. Echo 5/11: Mild LVH, EF 45-50%, mild AS, mild AI, mean gradient 11 mmHg, MVR with normal gradients,  moderate LAE, mild RAE;  c.  Echo 11/13:   mod LVH, EF 55-60%, mild AS (mean 18 mmHg), mild AI, severe LAE, mild RAE, PASP 41  . 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 SOB (shortness of breath)    with exertion  . Type II diabetes mellitus ([place])    takes Januvia,Glipizide,and Metformin daily as well as Lantus   Past Surgical History:  Procedure Laterality Date  . APPLICATION OF WOUND VAC  2014  . CARDIAC CATHETERIZATION  2002  . COLONOSCOPY    . CORONARY ARTERY BYPASS GRAFT  2002   x 3  . CORONARY ARTERY BYPASS GRAFT    . ESOPHAGOGASTRODUODENOSCOPY    . HERNIA REPAIR    . INCISIONAL  HERNIA REPAIR N/A 03/15/2014   Procedure: LAPAROSCOPIC INCISIONAL HERNIA;  Surgeon: Harl Bowie, MD;  Location: [facility];  Service: General;  Laterality: N/A;  . INSERTION OF MESH N/A 03/15/2014   Procedure: INSERTION OF MESH;  Surgeon: Harl Bowie, MD;  Location: [facility];  Service: General;  Laterality: N/A;  . LAPAROSCOPIC INCISIONAL / UMBILICAL / [place]  03/15/2014   IHR  . LAPAROTOMY N/A 02/09/2013   Procedure: EXPLORATORY LAPAROTOMY WITH incisional biopsy intra- ABDOMINAL MASS ILOCECTOMY ;  Surgeon: Harl Bowie, MD;  Location: [facility];  Service: General;  Laterality: N/A;  . MITRAL VALVE REPLACEMENT  11/2000   #33 St. Jude mechanical mitral valve prosthesis. Dr. Servando Snare  . PORT-A-CATH REMOVAL  03/15/2014  . PORT-A-CATH REMOVAL Left 03/15/2014   Procedure: REMOVAL PORT-A-CATH;  Surgeon: Harl Bowie, MD;  Location: [facility];  Service: General;  Laterality: Left;  . PORTACATH PLACEMENT N/A 02/09/2013   Procedure: INSERTION PORT-A-CATH;  Surgeon: Harl Bowie, MD;  Location: [facility];  Service: General;  Laterality: N/A;  . RIGHT/LEFT HEART CATH AND CORONARY/GRAFT ANGIOGRAPHY N/A 05/10/2017   Procedure: RIGHT/LEFT HEART CATH AND CORONARY/GRAFT ANGIOGRAPHY;  Surgeon: Martinique, Peter M, MD;  Location: [facility];  Service: Cardiovascular;   Laterality: N/A;  . SEPTOPLASTY    . SMALL INTESTINE SURGERY     Family History  Problem Relation Age of Onset  . Diabetes Mother   . Hypertension Father   . Thyroid disease Sister   . Diabetes Brother   . Arthritis Brother   . Depression Brother   . Heart disease Brother   . Hyperlipidemia Brother   . Hypertension Brother   . Hypertension Brother   . Diabetes Brother   . Heart disease Brother   . Hyperlipidemia Brother    Social History   Socioeconomic History  . Marital status: Married    Spouse name: Marcie Bal  . Number of children: 1  . Years of education: 73  . Highest education level: High school graduate  Occupational History  . Occupation: disabled  Social Needs  . Financial resource strain: Not hard at all  . Food insecurity    Worry: Never true    Inability: Never true  . Transportation needs    Medical: No    Non-medical: No  Tobacco Use  . Smoking status: Former Smoker    Years: 40.00    Types: Cigars  . Smokeless tobacco: Never Used  Substance and Sexual Activity  . Alcohol use: No    Frequency: Never    Comment: "drank for 30 years; quit ~ 2000"  . Drug use: No    Types: Marijuana    Comment: quit in 2000 'reefer'  . Sexual activity: Yes  Lifestyle  . Physical activity    Days per week: 3 days    Minutes per session: 120 min  . Stress: Not at all  Relationships  . Social connections    Talks on phone: More than three times a week    Gets together: More than three times a week    Attends religious service: Never    Active member of club or organization: No    Attends meetings of clubs or organizations: Never    Relationship status: Married  Other Topics Concern  . 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    Activities of Daily Living In your present state of health, do you have any  difficulty performing the following activities: 06/12/2019  Hearing? N  Vision? N  Comment wears glasses-had  Diabetic Eye Exam 3 months ago at [facility]  Difficulty concentrating or making decisions? N  Walking or climbing stairs? N  Dressing or bathing? N  Doing errands, shopping? N  Preparing Food and eating ? N  Using the Toilet? N  In the past six months, have you accidently leaked urine? N  Do you have problems with loss of bowel control? N  Managing your Medications? N  Managing your Finances? N  Housekeeping or managing your Housekeeping? N  Some recent data might be hidden    Patient Education/ Literacy How often do you need to have someone help you when you read instructions, pamphlets, or other written materials from your doctor or pharmacy?: 1 - Never What is the last grade level you completed in school?: 12th grade  Exercise Current Exercise Habits: Home exercise routine, Type of exercise: walking, Time (Minutes): > 60, Frequency (Times/Week): 3, Weekly Exercise (Minutes/Week): 0, Intensity: Mild, Exercise limited by: cardiac condition(s)  Diet Patient reports consuming 2 meals a day and 1 snack(s) a day Patient reports that his primary diet is: Regular Patient reports that she does have regular access to food.   Depression Screen PHQ 2/9 Scores 06/12/2019 05/25/2019 05/19/2019 04/13/2019 10/13/2018 07/11/2018 06/10/2018  PHQ - 2 Score 0 0 0 0 0 0 0  PHQ- 9 Score - - - - - 0 -     Fall Risk Fall Risk  06/12/2019 05/25/2019 05/19/2019 04/13/2019 10/13/2018  Falls in the past year? 0 0 0 0 0  Number falls in past yr: 0 - - - -  Injury with Fall? 0 - - - -  Follow up Falls prevention discussed - - - -  Comment No throw rugs in the house, adequate lighting in the walkways and grab bars in the bathroom - - - -     Objective:  Karina L Coor seemed alert and oriented and he participated appropriately during our telephone visit.  Blood Pressure Weight BMI  BP Readings from Last 3 Encounters:  05/25/19 136/67  05/19/19 123/69  05/06/19 (!) 132/58   Wt Readings from Last 3  Encounters:  05/25/19 214 lb (97.1 kg)  05/19/19 213 lb (96.6 kg)  05/06/19 216 lb (98 kg)   BMI Readings from Last 1 Encounters:  05/25/19 29.85 kg/m    *Unable to obtain current vital signs, weight, and BMI due to telephone visit type  Hearing/Vision  . Tedrick did not seem to have difficulty with hearing/understanding during the telephone conversation . Reports that he has had a formal eye exam by an eye care professional within the past year . Reports that he has not had a formal hearing evaluation within the past year *Unable to fully assess hearing and vision during telephone visit type  Cognitive Function: 6CIT Screen 06/12/2019  What Year? 0 points  What month? 0 points  What time? 0 points  Count back from 20 0 points  Months in reverse 0 points  Repeat phrase 0 points  Total Score 0   (Normal:0-7, Significant for Dysfunction: >8)  Normal Cognitive Function Screening: Yes   Immunization & Health Maintenance Record Immunization History  Administered Date(s) Administered  . Fluad Quad(high Dose 65+) 05/11/2019  . Influenza Split 06/14/2014  . Influenza, High Dose Seasonal PF 07/02/2017, 06/09/2018  . Influenza, Seasonal, Injecte, Preservative Fre 06/17/2015, 07/04/2017  . Influenza,inj,Quad PF,6+ Mos 07/04/2017  . Influenza-Unspecified  06/11/2016  . Pneumococcal Conjugate-13 06/10/2018  . Pneumococcal Polysaccharide-23 03/16/2014, 08/17/2015  . Tdap 09/29/2011  . Zoster Recombinat (Shingrix) 07/15/2018, 03/24/2019    Health Maintenance  Topic Date Due  . Hepatitis C Screening  1950-11-08  . URINE MICROALBUMIN  12/25/2018  . OPHTHALMOLOGY EXAM  02/21/2019  . INFLUENZA VACCINE  12/09/2019 (Originally 04/11/2019)  . HEMOGLOBIN A1C  10/11/2019  . FOOT EXAM  04/12/2020  . PNA vac Low Risk Adult (2 of 2 - PPSV23) 08/16/2020  . TETANUS/TDAP  09/28/2021  . COLONOSCOPY  04/11/2028       Assessment  This is a routine wellness examination for Kaveon L Porte.   Health Maintenance: Due or Overdue Health Maintenance Due  Topic Date Due  . Hepatitis C Screening  1951/04/01  . URINE MICROALBUMIN  12/25/2018  . OPHTHALMOLOGY EXAM  02/21/2019    Dunbar L Shon does not need a referral for Community Assistance: Care Management:   no Social Work:    no Prescription Assistance:  no Nutrition/Diabetes Education:  no   Plan:  Personalized Goals Goals Addressed            This Visit's Progress   . DIET - INCREASE WATER INTAKE       Try to drink 6-8 glasses of water daily.      Personalized Health Maintenance & Screening Recommendations  Pt is up to date on all Health Maintenance Recommendations  Lung Cancer Screening Recommended: no (Low Dose CT Chest recommended if Age 86-80 years, 30 pack-year currently smoking OR have quit w/in past 15 years) Hepatitis C Screening recommended: yes-will offer at next visit with PCP HIV Screening recommended: no  Advanced Directives: Written information was not prepared per patient's request.  Referrals & Orders No orders of the defined types were placed in this encounter.   Follow-up Plan . Follow-up with Claretta Fraise, MD as planned   I have personally reviewed and noted the following in the patient's chart:   . Medical and social history . Use of alcohol, tobacco or illicit drugs  . Current medications and supplements . Functional ability and status . Nutritional status . Physical activity . Advanced directives . List of other physicians . Hospitalizations, surgeries, and ER visits in previous 12 months . Vitals . Screenings to include cognitive, depression, and falls . Referrals and appointments  In addition, I have reviewed and discussed with Morley L Broberg certain preventive protocols, quality metrics, and best practice recommendations. A written personalized care plan for preventive services as well as general preventive health recommendations is available and can be mailed to  the patient at his request.      Milas Hock, LPN  46/01/[id number]  I have reviewed and agree with the above AWV documentation.   Mary-Margaret Hassell Done, FNP

## 2019-06-23 ENCOUNTER — Other Ambulatory Visit: Payer: Self-pay

## 2019-06-23 ENCOUNTER — Encounter: Payer: Self-pay | Admitting: Family Medicine

## 2019-06-23 ENCOUNTER — Ambulatory Visit (INDEPENDENT_AMBULATORY_CARE_PROVIDER_SITE_OTHER): Payer: PPO | Admitting: Family Medicine

## 2019-06-23 VITALS — BP 137/68 | HR 77 | Temp 96.9°F | Ht 71.0 in | Wt 217.4 lb

## 2019-06-23 DIAGNOSIS — E119 Type 2 diabetes mellitus without complications: Secondary | ICD-10-CM

## 2019-06-23 DIAGNOSIS — I4811 Longstanding persistent atrial fibrillation: Secondary | ICD-10-CM | POA: Diagnosis not present

## 2019-06-23 DIAGNOSIS — Z794 Long term (current) use of insulin: Secondary | ICD-10-CM

## 2019-06-23 DIAGNOSIS — Z7901 Long term (current) use of anticoagulants: Secondary | ICD-10-CM

## 2019-06-23 DIAGNOSIS — Z9889 Other specified postprocedural states: Secondary | ICD-10-CM | POA: Diagnosis not present

## 2019-06-23 LAB — COAGUCHEK XS/INR WAIVED
INR: 2.7 — ABNORMAL HIGH (ref 0.9–1.1)
Prothrombin Time: 32.7 s

## 2019-06-23 NOTE — Patient Instructions (Signed)
Keep taking coumadin as is. Careful with the greens! If a "Splurge" causes your glucose to go over 180 when checked two hours later, then you have gone too far. Cut back the next time.

## 2019-06-23 NOTE — Progress Notes (Signed)
Subjective:  Patient ID: John [name], male    DOB: August 06, 1951  Age: 68 y.o. MRN: [id number]  CC: Coagulation Disorder   HPI John [name] presents for recheck of his monthly INR due to the use of Coumadin for an artificial heart valve.  He denies any abnormal bruising or bleeding.  He has not had any chest pain shortness of breath cough or fever recently.  He denies any palpitations.  Depression screen [facility] 2/9 06/23/2019 06/12/2019 05/25/2019  Decreased Interest 0 0 0  Down, Depressed, Hopeless 0 0 0  PHQ - 2 Score 0 0 0  Altered sleeping - - -  Tired, decreased energy - - -  Change in appetite - - -  Feeling bad or failure about yourself  - - -  Trouble concentrating - - -  Moving slowly or fidgety/restless - - -  Suicidal thoughts - - -  PHQ-9 Score - - -    History John [name] has a past medical history of Allergy, Anemia, Arthritis, Atrial fibrillation ([place]), Bruises easily, CAD (coronary artery disease), CHF (congestive heart failure) ([place]), Constipation, Enlarged prostate, Flu (09/2013), GERD (gastroesophageal reflux disease), Glaucoma, History of blood transfusion, History of colon polyps, HLD (hyperlipidemia), HTN (hypertension), Insomnia, Kidney stones, Nocturia, Peripheral edema, Peripheral neuropathy, Rheumatic fever (~ 1965), Rheumatic heart disease, S/P MVR (mitral valve replacement) (11/2000), Small bowel cancer ([place]) (2014), SOB (shortness of breath), and Type II diabetes mellitus ([place]).   He has a past surgical history that includes Mitral valve replacement (11/2000); laparotomy (N/A, 02/09/2013); Portacath placement (N/A, 02/09/2013); Coronary artery bypass graft (2002); Septoplasty; Application if wound vac (2014); Colonoscopy; Esophagogastroduodenoscopy; Laparoscopic incisional / umbilical / ventral hernia repair (03/15/2014); Port-a-cath removal (03/15/2014); Hernia repair; Cardiac catheterization (2002); Incisional hernia repair (N/A, 03/15/2014); Port-a-cath removal (Left,  03/15/2014); Insertion of mesh (N/A, 03/15/2014); RIGHT/LEFT HEART CATH AND CORONARY/GRAFT ANGIOGRAPHY (N/A, 05/10/2017); Coronary artery bypass graft; and Small intestine surgery.   His family history includes Arthritis in his brother; Depression in his brother; Diabetes in his brother, brother, and mother; Heart disease in his brother and brother; Hyperlipidemia in his brother and brother; Hypertension in his brother, brother, and father; Thyroid disease in his sister.He reports that he has quit smoking. His smoking use included cigars. He quit after 40.00 years of use. He has never used smokeless tobacco. He reports that he does not drink alcohol or use drugs.    ROS Review of Systems  Constitutional: Negative for fever.  Respiratory: Negative for shortness of breath.   Cardiovascular: Negative for chest pain.  Musculoskeletal: Negative for arthralgias.  Skin: Negative for rash.    Objective:  BP 137/68    Pulse 77    Temp (!) 96.9 F (36.1 C) (Temporal)    Ht '5\' 11"'$  (1.803 m)    Wt 217 lb 6.4 oz (98.6 kg)    SpO2 99%    BMI 30.32 kg/m   BP Readings from Last 3 Encounters:  06/23/19 137/68  05/25/19 136/67  05/19/19 123/69    Wt Readings from Last 3 Encounters:  06/23/19 217 lb 6.4 oz (98.6 kg)  05/25/19 214 lb (97.1 kg)  05/19/19 213 lb (96.6 kg)     Physical Exam Vitals signs reviewed.  Constitutional:      Appearance: He is well-developed.  HENT:     Head: Normocephalic and atraumatic.     Right Ear: Tympanic membrane and external ear normal. No decreased hearing noted.     Left Ear: Tympanic membrane and external  ear normal. No decreased hearing noted.     Mouth/Throat:     Pharynx: No oropharyngeal exudate or posterior oropharyngeal erythema.  Eyes:     Pupils: Pupils are equal, round, and reactive to light.  Neck:     Musculoskeletal: Normal range of motion and neck supple.  Cardiovascular:     Rate and Rhythm: Normal rate and regular rhythm.     Heart sounds: No  murmur.  Pulmonary:     Effort: No respiratory distress.     Breath sounds: Normal breath sounds.  Abdominal:     General: Bowel sounds are normal.     Palpations: Abdomen is soft. There is no mass.     Tenderness: There is no abdominal tenderness.       Assessment & Plan:   John [name] was seen today for coagulation disorder.  Diagnoses and all orders for this visit:  Long term (current) use of anticoagulants -     CoaguChek XS/INR Waived -     CBC with Differential/Platelet; Standing -     CMP14+EGFR; Standing -     CoaguChek XS/INR Waived; Standing  Type 2 diabetes mellitus without complication, with long-term current use of insulin (HCC) -     CBC with Differential/Platelet; Standing -     CMP14+EGFR; Standing -     Bayer DCA Hb A1c Waived; Standing -     Lipid panel; Standing  Longstanding persistent atrial fibrillation (HCC) -     CBC with Differential/Platelet; Standing -     CMP14+EGFR; Standing  MITRAL VALVE REPLACEMENT, HX OF -     CBC with Differential/Platelet; Standing -     CMP14+EGFR; Standing       I am having John [name] maintain his Osteo Bi-Flex Adv Triple St, nitroGLYCERIN, aspirin, Viagra, triamcinolone cream, glucose blood, pravastatin, Dexilant, furosemide, glipiZIDE, metFORMIN, insulin glargine, metoprolol, Entresto, sitaGLIPtin, spironolactone, and warfarin.  Allergies as of 06/23/2019      Reactions   Doxycycline Hives      Medication List       Accurate as of June 23, 2019  5:42 PM. If you have any questions, ask your nurse or doctor.        aspirin 81 MG chewable tablet Chew 81 mg by mouth daily.   Dexilant 60 MG capsule Generic drug: dexlansoprazole Take 1 capsule (60 mg total) by mouth daily.   Entresto 49-51 MG Generic drug: sacubitril-valsartan Take 1 tablet by mouth 2 (two) times daily.   furosemide 40 MG tablet Commonly known as: LASIX Take 1 tablet (40 mg total) by mouth daily.   glipiZIDE 10 MG 24 hr  tablet Commonly known as: GLUCOTROL XL Take 1 tablet (10 mg total) by mouth daily.   glucose blood test strip Commonly known as: OneTouch Verio Test BS BID Dx E11.9   insulin glargine 100 UNIT/ML injection Commonly known as: LANTUS Inject 0.85 mLs (85 Units total) into the skin 2 (two) times daily as needed (takes 85 units every night at bedtime 2 nd dose only if needed for elevated blood sugars).   metFORMIN 500 MG tablet Commonly known as: GLUCOPHAGE Take 1 tablet (500 mg total) by mouth 2 (two) times daily with a meal.   metoprolol 200 MG 24 hr tablet Commonly known as: TOPROL-XL Take 1 tablet (200 mg total) by mouth daily after breakfast.   nitroGLYCERIN 0.4 MG SL tablet Commonly known as: NITROSTAT Place 0.4 mg under the tongue every 5 (five) minutes as needed for chest pain.  Osteo Bi-Flex Adv Triple St Tabs Take 500 each by mouth daily.   pravastatin 80 MG tablet Commonly known as: PRAVACHOL TAKE 1 TABLET BY MOUTH DAILY EVERY EVENING   sitaGLIPtin 50 MG tablet Commonly known as: JANUVIA Take 1 tablet (50 mg total) by mouth daily.   spironolactone 25 MG tablet Commonly known as: ALDACTONE Take 1 tablet (25 mg total) by mouth daily.   triamcinolone cream 0.1 % Commonly known as: KENALOG Apply 1 application topically 2 (two) times daily. Avoid face and genitalia   Viagra 100 MG tablet Generic drug: sildenafil Take 50 mg by mouth as needed.   warfarin 5 MG tablet Commonly known as: COUMADIN Take as directed by the anticoagulation clinic. If you are unsure how to take this medication, talk to your nurse or doctor. Original instructions: Take 1 tablet (5 mg total) by mouth daily.        Follow-up: Return in about 1 month (around 07/24/2019) for diabetes, CAD.  Claretta Fraise, M.D.

## 2019-07-07 ENCOUNTER — Telehealth: Payer: Self-pay | Admitting: Oncology

## 2019-07-07 ENCOUNTER — Inpatient Hospital Stay (HOSPITAL_BASED_OUTPATIENT_CLINIC_OR_DEPARTMENT_OTHER): Payer: PPO | Admitting: Oncology

## 2019-07-07 ENCOUNTER — Other Ambulatory Visit: Payer: Self-pay

## 2019-07-07 ENCOUNTER — Inpatient Hospital Stay: Payer: PPO | Attending: Oncology

## 2019-07-07 VITALS — BP 139/60 | HR 80 | Temp 98.0°F | Resp 18 | Ht 71.0 in | Wt 218.9 lb

## 2019-07-07 DIAGNOSIS — I129 Hypertensive chronic kidney disease with stage 1 through stage 4 chronic kidney disease, or unspecified chronic kidney disease: Secondary | ICD-10-CM | POA: Insufficient documentation

## 2019-07-07 DIAGNOSIS — E1122 Type 2 diabetes mellitus with diabetic chronic kidney disease: Secondary | ICD-10-CM | POA: Insufficient documentation

## 2019-07-07 DIAGNOSIS — D649 Anemia, unspecified: Secondary | ICD-10-CM | POA: Insufficient documentation

## 2019-07-07 DIAGNOSIS — Z8572 Personal history of non-Hodgkin lymphomas: Secondary | ICD-10-CM | POA: Diagnosis not present

## 2019-07-07 DIAGNOSIS — N189 Chronic kidney disease, unspecified: Secondary | ICD-10-CM | POA: Diagnosis not present

## 2019-07-07 DIAGNOSIS — C8590 Non-Hodgkin lymphoma, unspecified, unspecified site: Secondary | ICD-10-CM

## 2019-07-07 LAB — CMP (CANCER CENTER ONLY)
ALT: 19 U/L (ref 0–44)
AST: 20 U/L (ref 15–41)
Albumin: 3.8 g/dL (ref 3.5–5.0)
Alkaline Phosphatase: 119 U/L (ref 38–126)
Anion gap: 14 (ref 5–15)
BUN: 45 mg/dL — ABNORMAL HIGH (ref 8–23)
CO2: 23 mmol/L (ref 22–32)
Calcium: 9.1 mg/dL (ref 8.9–10.3)
Chloride: 106 mmol/L (ref 98–111)
Creatinine: 1.4 mg/dL — ABNORMAL HIGH (ref 0.61–1.24)
GFR, Est AFR Am: >60 mL/min (ref >60)
GFR, Estimated: 52 mL/min — ABNORMAL LOW (ref >60)
Glucose, Bld: 147 mg/dL — ABNORMAL HIGH (ref 70–99)
Potassium: 4.7 mmol/L (ref 3.5–5.1)
Sodium: 143 mmol/L (ref 135–145)
Total Bilirubin: 0.5 mg/dL (ref 0.3–1.2)
Total Protein: 7 g/dL (ref 6.5–8.1)

## 2019-07-07 LAB — CBC WITH DIFFERENTIAL (CANCER CENTER ONLY)
Abs Immature Granulocytes: 0.03 10*3/uL (ref 0.00–0.07)
Basophils Absolute: 0.1 10*3/uL (ref 0.0–0.1)
Basophils Relative: 1 %
Eosinophils Absolute: 0.2 10*3/uL (ref 0.0–0.5)
Eosinophils Relative: 3 %
HCT: 43.8 % (ref 39.0–52.0)
Hemoglobin: 14.6 g/dL (ref 13.0–17.0)
Immature Granulocytes: 0 %
Lymphocytes Relative: 14 %
Lymphs Abs: 1.1 10*3/uL (ref 0.7–4.0)
MCH: 31.5 pg (ref 26.0–34.0)
MCHC: 33.3 g/dL (ref 30.0–36.0)
MCV: 94.6 fL (ref 80.0–100.0)
Monocytes Absolute: 0.7 10*3/uL (ref 0.1–1.0)
Monocytes Relative: 9 %
Neutro Abs: 5.4 10*3/uL (ref 1.7–7.7)
Neutrophils Relative %: 73 %
Platelet Count: 180 10*3/uL (ref 150–400)
RBC: 4.63 MIL/uL (ref 4.22–5.81)
RDW: 13.3 % (ref 11.5–15.5)
WBC Count: 7.5 10*3/uL (ref 4.0–10.5)
nRBC: 0 % (ref 0.0–0.2)

## 2019-07-07 NOTE — Progress Notes (Signed)
Hematology and Oncology Follow Up Visit  John [name] [id number] [birth year] 68 y.o. 07/07/2019 8:02 AM   Principle Diagnosis: 68 year old man with stage IIa diffuse large cell lymphoma diagnosed in 2014.  He is in remission since completion of chemotherapy.  Prior Therapy: He is status post exploratory laparotomy and excision of an intra-abdominal mass and ileocolectomy and a Port-A-Cath placement on 02/09/2013 Chemotherapy with CHOP-R cycle one given on 04/16/2013. He is status post 4 cycles of chemotherapy completed in 06/2013.  Current therapy: Active surveillance.  Interim History:  John [name] is here for a follow-up visit.  Since the last visit, he reports feeling reasonably well without any complaints.  He denies any abdominal pain, distention or early satiety.  He denies any constitutional symptoms or hospitalizations.  His blood sugar remains under reasonable control with hemoglobin A1c less than 7.  He denies any painful adenopathy or constitutional symptoms.   He denied any alteration mental status, neuropathy, confusion or dizziness.  Denies any headaches or lethargy.  Denies any night sweats, weight loss or changes in appetite.  Denied orthopnea, dyspnea on exertion or chest discomfort.  Denies shortness of breath, difficulty breathing hemoptysis or cough.  Denies any abdominal distention, nausea, early satiety or dyspepsia.  Denies any hematuria, frequency, dysuria or nocturia.  Denies any skin irritation, dryness or rash.  Denies any ecchymosis or petechiae.  Denies any lymphadenopathy or clotting.  Denies any heat or cold intolerance.  Denies any anxiety or depression.  Remaining review of system is negative.     Medications: Updated on review. Current Outpatient Medications  Medication Sig Dispense Refill  . aspirin 81 MG chewable tablet Chew 81 mg by mouth daily.    Marland Kitchen dexlansoprazole (DEXILANT) 60 MG capsule Take 1 capsule (60 mg total) by mouth daily. 90 capsule 1  .  furosemide (LASIX) 40 MG tablet Take 1 tablet (40 mg total) by mouth daily. 90 tablet 1  . glipiZIDE (GLUCOTROL XL) 10 MG 24 hr tablet Take 1 tablet (10 mg total) by mouth daily. 90 tablet 1  . glucose blood (ONETOUCH VERIO) test strip Test BS BID Dx E11.9 200 each 3  . insulin glargine (LANTUS) 100 UNIT/ML injection Inject 0.85 mLs (85 Units total) into the skin 2 (two) times daily as needed (takes 85 units every night at bedtime 2 nd dose only if needed for elevated blood sugars). 10 mL 5  . metFORMIN (GLUCOPHAGE) 500 MG tablet Take 1 tablet (500 mg total) by mouth 2 (two) times daily with a meal. 180 tablet 1  . metoprolol (TOPROL-XL) 200 MG 24 hr tablet Take 1 tablet (200 mg total) by mouth daily after breakfast. 90 tablet 3  . Misc Natural Products (OSTEO BI-FLEX ADV TRIPLE ST) TABS Take 500 each by mouth daily.     . nitroGLYCERIN (NITROSTAT) 0.4 MG SL tablet Place 0.4 mg under the tongue every 5 (five) minutes as needed for chest pain.    . pravastatin (PRAVACHOL) 80 MG tablet TAKE 1 TABLET BY MOUTH DAILY EVERY EVENING 90 tablet 3  . sacubitril-valsartan (ENTRESTO) 49-51 MG Take 1 tablet by mouth 2 (two) times daily. 180 tablet 1  . sitaGLIPtin (JANUVIA) 50 MG tablet Take 1 tablet (50 mg total) by mouth daily. 90 tablet 1  . spironolactone (ALDACTONE) 25 MG tablet Take 1 tablet (25 mg total) by mouth daily. 90 tablet 1  . triamcinolone cream (KENALOG) 0.1 % Apply 1 application topically 2 (two) times daily. Avoid face and genitalia 45 g  2  . VIAGRA 100 MG tablet Take 50 mg by mouth as needed.   3  . warfarin (COUMADIN) 5 MG tablet Take 1 tablet (5 mg total) by mouth daily. 90 tablet 1   No current facility-administered medications for this visit.      Allergies:  Allergies  Allergen Reactions  . Doxycycline Hives    Past Medical History, Surgical history, Social history, and Family History updated without any changes.    Physical Exam: Blood pressure 139/60, pulse 80,  temperature 98 F (36.7 C), temperature source Oral, resp. rate 18, height 5\' 11"  (1.803 m), weight 218 lb 14.4 oz (99.3 kg), SpO2 100 %.    ECOG: 1     General appearance: Alert, awake without any distress. Head: Atraumatic without abnormalities Oropharynx: Without any thrush or ulcers. Eyes: No scleral icterus. Lymph nodes: No lymphadenopathy noted in the cervical, supraclavicular, or axillary nodes Heart:regular rate and rhythm, without any murmurs or gallops.   Lung: Clear to auscultation without any rhonchi, wheezes or dullness to percussion. Abdomin: Soft, nontender without any shifting dullness or ascites. Musculoskeletal: No clubbing or cyanosis. Neurological: No motor or sensory deficits. Skin: No rashes or lesions. Psychiatric: Mood and affect appeared normal.    Lab Results: Lab Results  Component Value Date   WBC 7.5 07/07/2019   HGB 14.6 07/07/2019   HCT 43.8 07/07/2019   MCV 94.6 07/07/2019   PLT 180 07/07/2019     Chemistry      Component Value Date/Time   NA 138 04/10/2019 0815   NA 141 07/09/2017 0818   K 5.2 04/10/2019 0815   K 4.9 07/09/2017 0818   CL 103 04/10/2019 0815   CL 106 01/21/2013 1050   CO2 22 04/10/2019 0815   CO2 21 (L) 07/09/2017 0818   BUN 51 (H) 04/10/2019 0815   BUN 44.9 (H) 07/09/2017 0818   CREATININE 1.43 (H) 04/10/2019 0815   CREATININE 1.59 (H) 07/08/2018 1026   CREATININE 1.4 (H) 07/09/2017 0818      Component Value Date/Time   CALCIUM 8.7 04/10/2019 0815   CALCIUM 9.1 07/09/2017 0818   ALKPHOS 104 04/10/2019 0815   ALKPHOS 104 07/09/2017 0818   AST 21 04/10/2019 0815   AST 22 07/08/2018 1026   AST 22 07/09/2017 0818   ALT 19 04/10/2019 0815   ALT 19 07/08/2018 1026   ALT 19 07/09/2017 0818   BILITOT 0.3 04/10/2019 0815   BILITOT 0.5 07/08/2018 1026   BILITOT 0.50 07/09/2017 0818       Impression and Plan:  68 year old man with:   1.diffuse large cell lymphoma diagnosed in 2014.  He was found to have  stage IIA and remains in remission.  He is status post therapy outlined above and does not exhibit any signs or symptoms of relapse.  The natural course of this disease and risk of relapse was assessed again.  Laboratory data personally reviewed today and continues to be within normal range.  He does not have any indication to repeat imaging studies at this time which will be done as needed.  At this time, I recommended continued annual surveillance with physical exam and laboratory testing and repeat CT scan as needed.  2. Anemia: Resolved at this time with hemoglobin back to normal.    3. Chronic renal insufficiency: Related to longstanding hypertension and diabetes with kidney function being stable.  4.  Colon cancer screening: Is up-to-date on colonoscopy and age-appropriate cancer screening.  5. Followup: We will be  in 1 year for repeat evaluation.  15  minutes was spent with the patient face-to-face today.  More than 50% of time was spent on reviewing laboratory data, disease status update as well as answering questions regarding future plan of care.   Roxy Cedar Greco Gastelum 10/27/20208:02 AM

## 2019-07-07 NOTE — Telephone Encounter (Signed)
Scheduled appt per 10/26 los. ° °Left a VM of the appt date and time. °

## 2019-07-23 ENCOUNTER — Encounter: Payer: Self-pay | Admitting: Family Medicine

## 2019-07-23 ENCOUNTER — Other Ambulatory Visit: Payer: Self-pay

## 2019-07-23 ENCOUNTER — Ambulatory Visit (INDEPENDENT_AMBULATORY_CARE_PROVIDER_SITE_OTHER): Payer: PPO | Admitting: Family Medicine

## 2019-07-23 ENCOUNTER — Other Ambulatory Visit: Payer: PPO

## 2019-07-23 ENCOUNTER — Ambulatory Visit: Payer: PPO

## 2019-07-23 VITALS — BP 130/64 | HR 81 | Temp 98.0°F | Ht 71.0 in | Wt 218.0 lb

## 2019-07-23 DIAGNOSIS — Z9889 Other specified postprocedural states: Secondary | ICD-10-CM | POA: Diagnosis not present

## 2019-07-23 DIAGNOSIS — I4811 Longstanding persistent atrial fibrillation: Secondary | ICD-10-CM | POA: Diagnosis not present

## 2019-07-23 DIAGNOSIS — Z7901 Long term (current) use of anticoagulants: Secondary | ICD-10-CM | POA: Diagnosis not present

## 2019-07-23 DIAGNOSIS — T148XXA Other injury of unspecified body region, initial encounter: Secondary | ICD-10-CM

## 2019-07-23 DIAGNOSIS — Z23 Encounter for immunization: Secondary | ICD-10-CM

## 2019-07-23 DIAGNOSIS — S80922A Unspecified superficial injury of left lower leg, initial encounter: Secondary | ICD-10-CM

## 2019-07-23 DIAGNOSIS — E119 Type 2 diabetes mellitus without complications: Secondary | ICD-10-CM

## 2019-07-23 DIAGNOSIS — Z794 Long term (current) use of insulin: Secondary | ICD-10-CM | POA: Diagnosis not present

## 2019-07-23 LAB — BAYER DCA HB A1C WAIVED: HB A1C (BAYER DCA - WAIVED): 7.2 % — ABNORMAL HIGH (ref <7.0)

## 2019-07-23 NOTE — Addendum Note (Signed)
Addended by: Karle Plumber on: 07/23/2019 12:33 PM   Modules accepted: Orders

## 2019-07-23 NOTE — Patient Instructions (Signed)
Keep area cleaned.  Apply the ointment I gave you twice daily. You were given a tetanus booster today. The steri-strips will fall off in about 5-7 days.  If they do not, you can remove them   Laceration Care, Adult A laceration is a cut that may go through all layers of the skin. The cut may also go into the tissue that is right under the skin. Some cuts heal on their own. Others need to be closed with stitches (sutures), staples, skin adhesive strips, or skin glue. Taking care of your injury lowers your risk of infection, helps your injury to heal better, and may prevent scarring. Supplies needed:  Soap.  Water.  Hand sanitizer.  Bandage (dressing).  Antibiotic ointment.  Clean towel. How to take care of your cut Wash your hands with soap and water before touching your wound or changing your bandage. If soap and water are not available, use hand sanitizer. If your doctor used stitches or staples:  Keep the wound clean and dry.  If you were given a bandage, change it at least once a day as told by your doctor. You should also change it if it gets wet or dirty.  Keep the wound completely dry for the first 24 hours, or as told by your doctor. After that, you may take a shower or a bath. Do not get the wound soaked in water until after the stitches or staples have been removed.  Clean the wound once a day, or as told by your doctor: ? Wash the wound with soap and water. ? Rinse the wound with water to remove all soap. ? Pat the wound dry with a clean towel. Do not rub the wound.  After you clean the wound, put a thin layer of antibiotic ointment on it as told by your doctor. This ointment: ? Helps to prevent infection. ? Keeps the bandage from sticking to the wound.  Have your stitches or staples removed as told by your doctor. If your doctor used skin adhesive strips:  Keep the wound clean and dry.  If you were given a bandage, you should change it at least once a day as  told by your doctor. You should also change it if it gets wet or dirty.  Do not get the skin adhesive strips wet. You can take a shower or a bath, but keep the wound dry.  If the wound gets wet, pat it dry with a clean towel. Do not rub the wound.  Skin adhesive strips fall off on their own. You can trim the strips as the wound heals. Do not remove any strips that are still stuck to the wound. They will fall off after a while. If your doctor used skin glue:  Try to keep your wound dry, but you may briefly wet it in the shower or bath. Do not soak the wound in water, such as by swimming.  After you take a shower or a bath, gently pat the wound dry with a clean towel. Do not rub the wound.  Do not do any activities that will make you really sweaty until the skin glue has fallen off on its own.  Do not apply liquid, cream, or ointment medicine to your wound while the skin glue is still on.  If you were given a bandage, you should change it at least once a day or as told by your doctor. You should also change it if it gets dirty or wet.  If  a bandage is placed over the wound, do not let the tape touch the skin glue.  Do not pick at the glue. The skin glue usually stays on for 5-10 days. Then, it falls off the skin. General instructions   Take over-the-counter and prescription medicines only as told by your doctor.  If you were given antibiotic medicine or ointment, take or apply it as told by your doctor. Do not stop using it even if your condition improves.  Do not scratch or pick at the wound.  Check your wound every day for signs of infection. Watch for: ? Redness, swelling, or pain. ? Fluid, blood, or pus.  Raise (elevate) the injured area above the level of your heart while you are sitting or lying down.  If directed, put ice on the affected area: ? Put ice in a plastic bag. ? Place a towel between your skin and the bag. ? Leave the ice on for 20 minutes, 2-3 times a day.   Prevent scarring by covering your wound with sunscreen of at least 30 SPF whenever you are outside after your wound has healed.  Keep all follow-up visits as told by your doctor. This is important. Get help if:  You got a tetanus shot and you have any of these problems at the injection site: ? Swelling. ? Very bad pain. ? Redness. ? Bleeding.  You have a fever.  A wound that was closed breaks open.  You notice a bad smell coming from your wound or your bandage.  You notice something coming out of the wound, such as wood or glass.  Medicine does not relieve your pain.  You have more redness, swelling, or pain at the site of your wound.  You have fluid, blood, or pus coming from your wound.  You notice a change in the color of your skin near your wound.  You need to change the bandage often because fluid, blood, or pus is coming from the wound.  You start to have a new rash.  You start to have numbness around the wound. Get help right away if:  You have very bad swelling around the wound.  Your pain suddenly gets worse and is very bad.  You notice painful lumps near the wound or anywhere on your body.  You have a red streak going away from your wound.  The wound is on your hand or foot, and: ? You cannot move a finger or toe. ? Your fingers or toes look pale or bluish. Summary  A laceration is a cut that may go through all layers of the skin. The cut may also go into the tissue right under the skin.  Some cuts heal on their own. Others need to be closed with stitches, staples, skin adhesive strips, or skin glue.  Follow your doctor's instructions for caring for your cut. Proper care of a cut lowers the risk of infection, helps the cut heal better, and prevents scarring. This information is not intended to replace advice given to you by your health care provider. Make sure you discuss any questions you have with your health care provider. Document Released:  02/13/2008 Document Revised: 10/25/2017 Document Reviewed: 09/16/2017 Elsevier Patient Education  2020 [employer].

## 2019-07-23 NOTE — Progress Notes (Signed)
Subjective: [id number] PCP: Claretta Fraise, MD DTO:[id number] John [name] is a 68 y.o. male presenting to clinic today for:  1.  Laceration Patient sustained a laceration to the lateral left leg about an hour ago when he was working in the yard and a wood piece cut him.  He is on anticoagulation.  He notes some bleeding and oozing.  Mild pain.   ROS: Per HPI  Allergies  Allergen Reactions  . Doxycycline Hives   Past Medical History:  Diagnosis Date  . Allergy   . Anemia    "lost blood w/the cancer"  . Arthritis    back   . Atrial fibrillation ([place])    takes Coumadin daily  . Bruises easily    takes Coumadin daily  . CAD (coronary artery disease)    a. s/p CABG 11/2000 (John-LAD, S-RCA);  b. Lexiscan Myoview 11/12: EF 45%, ischemia and possibly some scar at the base of the inferolateral wall;   c. Lex MV 11/13:  EF 44%, Inf and IL scar/soft tissue atten, small mild amt of inf ischemia,  . CHF (congestive heart failure) ([place])   . Constipation    takes Colace daily  . Enlarged prostate   . Flu 09/2013  . GERD (gastroesophageal reflux disease)    takes Nexium daily  . Glaucoma   . History of blood transfusion    "related to cancer"  . History of colon polyps   . HLD (hyperlipidemia)    takes Vytorin daily  . HTN (hypertension)    takes Amlodipine,Metoprolol, and Ramipril daily  . Insomnia    states he has always been like this.But doesn't take any meds  . Kidney stones    "I passed them all"  . Nocturia   . Peripheral edema    takes Furosemide daily  . Peripheral neuropathy   . Rheumatic fever ~ 1965  . Rheumatic heart disease    a. s/p mechanical ([employer]) MVR 2002;  b. Echo 5/11: Mild LVH, EF 45-50%, mild AS, mild AI, mean gradient 11 mmHg, MVR with normal gradients, moderate LAE, mild RAE;  c.  Echo 11/13:   mod LVH, EF 55-60%, mild AS (mean 18 mmHg), mild AI, severe LAE, mild RAE, PASP 41  . 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 SOB (shortness of breath)    with exertion  . Type II diabetes mellitus ([place])    takes Januvia,Glipizide,and Metformin daily as well as Lantus    Current Outpatient Medications:  .  aspirin 81 MG chewable tablet, Chew 81 mg by mouth daily., Disp: , Rfl:  .  dexlansoprazole (DEXILANT) 60 MG capsule, Take 1 capsule (60 mg total) by mouth daily., Disp: 90 capsule, Rfl: 1 .  furosemide (LASIX) 40 MG tablet, Take 1 tablet (40 mg total) by mouth daily., Disp: 90 tablet, Rfl: 1 .  glipiZIDE (GLUCOTROL XL) 10 MG 24 hr tablet, Take 1 tablet (10 mg total) by mouth daily., Disp: 90 tablet, Rfl: 1 .  glucose blood (ONETOUCH VERIO) test strip, Test BS BID Dx E11.9, Disp: 200 each, Rfl: 3 .  insulin glargine (LANTUS) 100 UNIT/ML injection, Inject 0.85 mLs (85 Units total) into the skin 2 (two) times daily as needed (takes 85 units every night at bedtime 2 nd dose only if needed for elevated blood sugars)., Disp: 10 mL, Rfl: 5 .  metFORMIN (GLUCOPHAGE) 500 MG tablet, Take 1 tablet (500 mg total) by mouth 2 (two) times  daily with a meal., Disp: 180 tablet, Rfl: 1 .  metoprolol (TOPROL-XL) 200 MG 24 hr tablet, Take 1 tablet (200 mg total) by mouth daily after breakfast., Disp: 90 tablet, Rfl: 3 .  Misc Natural Products (OSTEO BI-FLEX ADV TRIPLE ST) TABS, Take 500 each by mouth daily. , Disp: , Rfl:  .  nitroGLYCERIN (NITROSTAT) 0.4 MG SL tablet, Place 0.4 mg under the tongue every 5 (five) minutes as needed for chest pain., Disp: , Rfl:  .  pravastatin (PRAVACHOL) 80 MG tablet, TAKE 1 TABLET BY MOUTH DAILY EVERY EVENING, Disp: 90 tablet, Rfl: 3 .  sacubitril-valsartan (ENTRESTO) 49-51 MG, Take 1 tablet by mouth 2 (two) times daily., Disp: 180 tablet, Rfl: 1 .  sitaGLIPtin (JANUVIA) 50 MG tablet, Take 1 tablet (50 mg total) by mouth daily., Disp: 90 tablet, Rfl: 1 .  spironolactone (ALDACTONE) 25 MG tablet, Take 1 tablet (25 mg total) by mouth daily., Disp: 90 tablet, Rfl: 1 .  triamcinolone cream (KENALOG)  0.1 %, Apply 1 application topically 2 (two) times daily. Avoid face and genitalia, Disp: 45 g, Rfl: 2 .  VIAGRA 100 MG tablet, Take 50 mg by mouth as needed. , Disp: , Rfl: 3 .  warfarin (COUMADIN) 5 MG tablet, Take 1 tablet (5 mg total) by mouth daily., Disp: 90 tablet, Rfl: 1 Social History   Socioeconomic History  . Marital status: Married    Spouse name: Marcie Bal  . Number of children: 1  . Years of education: 34  . Highest education level: High school graduate  Occupational History  . Occupation: disabled  Social Needs  . Financial resource strain: Not hard at all  . Food insecurity    Worry: Never true    Inability: Never true  . Transportation needs    Medical: No    Non-medical: No  Tobacco Use  . Smoking status: Former Smoker    Years: 40.00    Types: Cigars  . Smokeless tobacco: Never Used  Substance and Sexual Activity  . Alcohol use: No    Frequency: Never    Comment: "drank for 30 years; quit ~ 2000"  . Drug use: No    Types: Marijuana    Comment: quit in 2000 'reefer'  . Sexual activity: Yes  Lifestyle  . Physical activity    Days per week: 3 days    Minutes per session: 120 min  . Stress: Not at all  Relationships  . Social connections    Talks on phone: More than three times a week    Gets together: More than three times a week    Attends religious service: Never    Active member of club or organization: No    Attends meetings of clubs or organizations: Never    Relationship status: Married  . Intimate partner violence    Fear of current or ex partner: No    Emotionally abused: No    Physically abused: No    Forced sexual activity: No  Other Topics Concern  . 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   Family History  Problem Relation Age of Onset  . Diabetes Mother   . Hypertension Father   . Thyroid disease Sister   . Diabetes Brother   . Arthritis Brother   . Depression Brother   . Heart  disease Brother   . Hyperlipidemia Brother   . Hypertension Brother   . Hypertension Brother   . Diabetes  Brother   . Heart disease Brother   . Hyperlipidemia Brother     Objective: Office vital signs reviewed. BP 130/64   Pulse 81   Temp 98 F (36.7 C) (Temporal)   Ht 5\' 11"  (1.803 m)   Wt 218 lb (98.9 kg)   SpO2 97%   BMI 30.40 kg/m   Physical Examination:  General: Awake, alert, well nourished, No acute distress Skin: 4cm vertical, linear laceration noted along the left lateral leg.  There is oozing.  This appears to be a superficial laceration and only involves skin.  No foreign bodies noted.  Assessment/ Plan: 68 y.o. male   1. Superficial laceration Area was irrigated and explored.  This appears to be superficial with no retained foreign bodies.  Lesion was oozing very slightly.  Steri-Strips were used to approximate the skin.  A pressure bandage was also applied.  I discussed that he may remove the pressure bandage tomorrow.  He will continue to keep the area clean and apply a topical antibiotic cream twice daily.  Steri-Strips may be removed in about 7 days.  I discussed with him red flag signs and symptoms warranting further evaluation.  He voiced good understanding.  He has follow-up with his PCP on Monday for general checkup.  2. Need for tetanus booster Tetanus shot was administered during today's visit.   No orders of the defined types were placed in this encounter.  No orders of the defined types were placed in this encounter.    Janora Norlander, DO [facility] [phone]

## 2019-07-24 LAB — CBC WITH DIFFERENTIAL/PLATELET
Basophils Absolute: 0.1 10*3/uL (ref 0.0–0.2)
Basos: 1 %
EOS (ABSOLUTE): 0.2 10*3/uL (ref 0.0–0.4)
Eos: 3 %
Hematocrit: 42.4 % (ref 37.5–51.0)
Hemoglobin: 13.8 g/dL (ref 13.0–17.7)
Immature Grans (Abs): 0 10*3/uL (ref 0.0–0.1)
Immature Granulocytes: 0 %
Lymphocytes Absolute: 1 10*3/uL (ref 0.7–3.1)
Lymphs: 19 %
MCH: 31.2 pg (ref 26.6–33.0)
MCHC: 32.5 g/dL (ref 31.5–35.7)
MCV: 96 fL (ref 79–97)
Monocytes Absolute: 0.7 10*3/uL (ref 0.1–0.9)
Monocytes: 13 %
Neutrophils Absolute: 3.6 10*3/uL (ref 1.4–7.0)
Neutrophils: 64 %
Platelets: 178 10*3/uL (ref 150–450)
RBC: 4.43 x10E6/uL (ref 4.14–5.80)
RDW: 13.6 % (ref 11.6–15.4)
WBC: 5.6 10*3/uL (ref 3.4–10.8)

## 2019-07-24 LAB — LIPID PANEL
Chol/HDL Ratio: 5.2 ratio — ABNORMAL HIGH (ref 0.0–5.0)
Cholesterol, Total: 145 mg/dL (ref 100–199)
HDL: 28 mg/dL — ABNORMAL LOW (ref >39)
LDL Chol Calc (NIH): 89 mg/dL (ref 0–99)
Triglycerides: 161 mg/dL — ABNORMAL HIGH (ref 0–149)
VLDL Cholesterol Cal: 28 mg/dL (ref 5–40)

## 2019-07-24 LAB — CMP14+EGFR
ALT: 18 IU/L (ref 0–44)
AST: 21 IU/L (ref 0–40)
Albumin/Globulin Ratio: 1.6 (ref 1.2–2.2)
Albumin: 3.9 g/dL (ref 3.8–4.8)
Alkaline Phosphatase: 130 IU/L — ABNORMAL HIGH (ref 39–117)
BUN/Creatinine Ratio: 31 — ABNORMAL HIGH (ref 10–24)
BUN: 43 mg/dL — ABNORMAL HIGH (ref 8–27)
Bilirubin Total: 0.3 mg/dL (ref 0.0–1.2)
CO2: 21 mmol/L (ref 20–29)
Calcium: 8.9 mg/dL (ref 8.6–10.2)
Chloride: 103 mmol/L (ref 96–106)
Creatinine, Ser: 1.39 mg/dL — ABNORMAL HIGH (ref 0.76–1.27)
GFR calc Af Amer: 61 mL/min/{1.73_m2} (ref >59)
GFR calc non Af Amer: 52 mL/min/{1.73_m2} — ABNORMAL LOW (ref >59)
Globulin, Total: 2.4 g/dL (ref 1.5–4.5)
Glucose: 135 mg/dL — ABNORMAL HIGH (ref 65–99)
Potassium: 4.8 mmol/L (ref 3.5–5.2)
Sodium: 141 mmol/L (ref 134–144)
Total Protein: 6.3 g/dL (ref 6.0–8.5)

## 2019-07-27 ENCOUNTER — Other Ambulatory Visit: Payer: Self-pay

## 2019-07-27 ENCOUNTER — Encounter: Payer: Self-pay | Admitting: Family Medicine

## 2019-07-27 ENCOUNTER — Ambulatory Visit (INDEPENDENT_AMBULATORY_CARE_PROVIDER_SITE_OTHER): Payer: PPO | Admitting: Family Medicine

## 2019-07-27 VITALS — BP 140/63 | HR 80 | Temp 97.1°F | Resp 20 | Ht 71.0 in | Wt 218.0 lb

## 2019-07-27 DIAGNOSIS — T148XXA Other injury of unspecified body region, initial encounter: Secondary | ICD-10-CM

## 2019-07-27 DIAGNOSIS — I10 Essential (primary) hypertension: Secondary | ICD-10-CM | POA: Diagnosis not present

## 2019-07-27 DIAGNOSIS — I4811 Longstanding persistent atrial fibrillation: Secondary | ICD-10-CM

## 2019-07-27 DIAGNOSIS — Z9889 Other specified postprocedural states: Secondary | ICD-10-CM | POA: Diagnosis not present

## 2019-07-27 DIAGNOSIS — Z7901 Long term (current) use of anticoagulants: Secondary | ICD-10-CM | POA: Diagnosis not present

## 2019-07-27 DIAGNOSIS — E119 Type 2 diabetes mellitus without complications: Secondary | ICD-10-CM

## 2019-07-27 DIAGNOSIS — Z794 Long term (current) use of insulin: Secondary | ICD-10-CM | POA: Diagnosis not present

## 2019-07-27 LAB — PROTIME-INR: INR: 2.8 — AB (ref 0.9–1.1)

## 2019-07-27 LAB — COAGUCHEK XS/INR WAIVED
INR: 2.8 — ABNORMAL HIGH (ref 0.9–1.1)
Prothrombin Time: 33 s

## 2019-07-27 MED ORDER — TRIAMCINOLONE ACETONIDE 0.1 % EX CREA: 1.0000 "application " | TOPICAL_CREAM | Freq: Two times a day (BID) | CUTANEOUS | 2 refills | Status: DC

## 2019-07-27 MED ORDER — FUROSEMIDE 40 MG PO TABS: 40.0000 mg | ORAL_TABLET | Freq: Every day | ORAL | 1 refills | Status: DC

## 2019-07-27 MED ORDER — WARFARIN SODIUM 5 MG PO TABS: 5.0000 mg | ORAL_TABLET | Freq: Every day | ORAL | 1 refills | Status: DC

## 2019-07-27 NOTE — Progress Notes (Signed)
Subjective:  Patient ID: John [name], male    DOB: [birth year]  Age: 68 y.o. MRN: [id number]  CC: Recheck protime   HPI John [name] presents for Atrial fibrillation and mitral valve replacement follow up. Pt. is treated with rate control and anticoagulation. Pt.  denies palpitations, rapid rate, chest pain, dyspnea and edema. There has been no bleeding from nose or gums. Pt. has not noticed blood with urine or stool.  Although there is routine bruising easily, it is not excessive.     Depression screen [facility] 2/9 07/27/2019 06/23/2019 06/12/2019  Decreased Interest 0 0 0  Down, Depressed, Hopeless 0 0 0  PHQ - 2 Score 0 0 0  Altered sleeping - - -  Tired, decreased energy - - -  Change in appetite - - -  Feeling bad or failure about yourself  - - -  Trouble concentrating - - -  Moving slowly or fidgety/restless - - -  Suicidal thoughts - - -  PHQ-9 Score - - -    History John [name] has a past medical history of Allergy, Anemia, Arthritis, Atrial fibrillation ([place]), Bruises easily, CAD (coronary artery disease), CHF (congestive heart failure) ([place]), Constipation, Enlarged prostate, Flu (09/2013), GERD (gastroesophageal reflux disease), Glaucoma, History of blood transfusion, History of colon polyps, HLD (hyperlipidemia), HTN (hypertension), Insomnia, Kidney stones, Nocturia, Peripheral edema, Peripheral neuropathy, Rheumatic fever (~ 1965), Rheumatic heart disease, S/P MVR (mitral valve replacement) (11/2000), 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   John [name] has a past surgical history that includes Mitral valve replacement (11/2000); laparotomy (N/A, 02/09/2013); Portacath placement (N/A, 02/09/2013); Coronary artery bypass graft (2002); Septoplasty; Application if wound vac (2014); Colonoscopy; Esophagogastroduodenoscopy; Laparoscopic incisional / umbilical / ventral hernia repair (03/15/2014); Port-a-cath removal (03/15/2014); Hernia repair;  Cardiac catheterization (2002); Incisional hernia repair (N/A, 03/15/2014); Port-a-cath removal (Left, 03/15/2014); Insertion of mesh (N/A, 03/15/2014); RIGHT/LEFT HEART CATH AND CORONARY/GRAFT ANGIOGRAPHY (N/A, 05/10/2017); Coronary artery bypass graft; and Small intestine surgery.   His family history includes Arthritis in his brother; Depression in his brother; Diabetes in his brother, brother, and mother; Heart disease in his brother and brother; Hyperlipidemia in his brother and brother; Hypertension in his brother, brother, and father; Thyroid disease in his sister.John [name] reports that John [name] has quit smoking. His smoking use included cigars. John [name] quit after 40.00 years of use. John [name] has never used smokeless tobacco. John [name] reports that John [name] does not drink alcohol or use drugs.    ROS Review of Systems  Constitutional: Negative for fever.  Respiratory: Negative for shortness of breath.   Cardiovascular: Negative for chest pain.  Musculoskeletal: Negative for arthralgias.  Skin: Negative for rash.    Objective:  BP 140/63    Pulse 80    Temp (!) 97.1 F (36.2 C) (Temporal)    Resp 20    Ht 5\' 11"  (1.803 m)    Wt 218 lb (98.9 kg)    SpO2 100%    BMI 30.40 kg/m   BP Readings from Last 3 Encounters:  07/27/19 140/63  07/23/19 130/64  07/07/19 139/60    Wt Readings from Last 3 Encounters:  07/27/19 218 lb (98.9 kg)  07/23/19 218 lb (98.9 kg)  07/07/19 218 lb 14.4 oz (99.3 kg)     Physical Exam Vitals signs reviewed.  Constitutional:      Appearance: John [name] is well-developed.  HENT:     Head: Normocephalic and atraumatic.     Right Ear: External ear  normal.     Left Ear: External ear normal.     Mouth/Throat:     Pharynx: No oropharyngeal exudate or posterior oropharyngeal erythema.  Eyes:     Pupils: Pupils are equal, round, and reactive to light.  Neck:     Musculoskeletal: Normal range of motion and neck supple.  Cardiovascular:     Rate and Rhythm: Normal rate and regular rhythm.     Heart  sounds: No murmur.  Pulmonary:     Effort: No respiratory distress.     Breath sounds: Normal breath sounds.  Neurological:     Mental Status: John [name] is alert and oriented to person, place, and time.     Results for orders placed or performed in visit on 07/27/19  inr FINGERSTICK  Result Value Ref Range   INR 2.8 (H) 0.9 - 1.1   Prothrombin Time 33.0 sec  Protime-INR  Result Value Ref Range   INR 2.8 (A) 0.9 - 1.1     Assessment & Plan:   John [name] was seen today for recheck protime.  Diagnoses and all orders for this visit:  Longstanding persistent atrial fibrillation ([place]) -     inr FINGERSTICK  Long term (current) use of anticoagulants [Z79.01]  MITRAL VALVE REPLACEMENT, HX OF  Essential hypertension  Type 2 diabetes mellitus without complication, with long-term current use of insulin (HCC)  Superficial laceration  Other orders -     triamcinolone cream (KENALOG) 0.1 %; Apply 1 application topically 2 (two) times daily. Avoid face and genitalia -     furosemide (LASIX) 40 MG tablet; Take 1 tablet (40 mg total) by mouth daily. -     warfarin (COUMADIN) 5 MG tablet; Take 1 tablet (5 mg total) by mouth daily. -     Protime-INR       I am having John [name] maintain his Osteo Bi-Flex Adv Triple St, nitroGLYCERIN, aspirin, Viagra, glucose blood, pravastatin, Dexilant, glipiZIDE, metFORMIN, insulin glargine, metoprolol, Entresto, sitaGLIPtin, spironolactone, triamcinolone cream, furosemide, and warfarin.  Allergies as of 07/27/2019      Reactions   Doxycycline Hives      Medication List       Accurate as of July 27, 2019 11:59 PM. If you have any questions, ask your nurse or doctor.        aspirin 81 MG chewable tablet Chew 81 mg by mouth daily.   Dexilant 60 MG capsule Generic drug: dexlansoprazole Take 1 capsule (60 mg total) by mouth daily.   Entresto 49-51 MG Generic drug: sacubitril-valsartan Take 1 tablet by mouth 2 (two) times daily.     furosemide 40 MG tablet Commonly known as: LASIX Take 1 tablet (40 mg total) by mouth daily.   glipiZIDE 10 MG 24 hr tablet Commonly known as: GLUCOTROL XL Take 1 tablet (10 mg total) by mouth daily.   glucose blood test strip Commonly known as: OneTouch Verio Test BS BID Dx E11.9   insulin glargine 100 UNIT/ML injection Commonly known as: LANTUS Inject 0.85 mLs (85 Units total) into the skin 2 (two) times daily as needed (takes 85 units every night at bedtime 2 nd dose only if needed for elevated blood sugars).   metFORMIN 500 MG tablet Commonly known as: GLUCOPHAGE Take 1 tablet (500 mg total) by mouth 2 (two) times daily with a meal.   metoprolol 200 MG 24 hr tablet Commonly known as: TOPROL-XL Take 1 tablet (200 mg total) by mouth daily after breakfast.   nitroGLYCERIN 0.4 MG  SL tablet Commonly known as: NITROSTAT Place 0.4 mg under the tongue every 5 (five) minutes as needed for chest pain.   Osteo Bi-Flex Adv Triple St Tabs Take 500 each by mouth daily.   pravastatin 80 MG tablet Commonly known as: PRAVACHOL TAKE 1 TABLET BY MOUTH DAILY EVERY EVENING   sitaGLIPtin 50 MG tablet Commonly known as: JANUVIA Take 1 tablet (50 mg total) by mouth daily.   spironolactone 25 MG tablet Commonly known as: ALDACTONE Take 1 tablet (25 mg total) by mouth daily.   triamcinolone cream 0.1 % Commonly known as: KENALOG Apply 1 application topically 2 (two) times daily. Avoid face and genitalia   Viagra 100 MG tablet Generic drug: sildenafil Take 50 mg by mouth as needed.   warfarin 5 MG tablet Commonly known as: COUMADIN Take as directed by the anticoagulation clinic. If you are unsure how to take this medication, talk to your nurse or doctor. Original instructions: Take 1 tablet (5 mg total) by mouth daily.        Follow-up: Return in about 1 month (around 08/26/2019).  Claretta Fraise, M.D.

## 2019-08-24 ENCOUNTER — Telehealth: Payer: Self-pay | Admitting: Family Medicine

## 2019-08-25 ENCOUNTER — Encounter: Payer: Self-pay | Admitting: Family Medicine

## 2019-08-25 ENCOUNTER — Ambulatory Visit (INDEPENDENT_AMBULATORY_CARE_PROVIDER_SITE_OTHER): Payer: PPO | Admitting: Family Medicine

## 2019-08-25 ENCOUNTER — Other Ambulatory Visit: Payer: Self-pay

## 2019-08-25 VITALS — BP 126/60 | HR 76 | Temp 96.6°F | Resp 20 | Ht 71.0 in | Wt 211.0 lb

## 2019-08-25 DIAGNOSIS — Z5181 Encounter for therapeutic drug level monitoring: Secondary | ICD-10-CM | POA: Diagnosis not present

## 2019-08-25 DIAGNOSIS — Z9889 Other specified postprocedural states: Secondary | ICD-10-CM | POA: Diagnosis not present

## 2019-08-25 DIAGNOSIS — I4811 Longstanding persistent atrial fibrillation: Secondary | ICD-10-CM

## 2019-08-25 DIAGNOSIS — Z7901 Long term (current) use of anticoagulants: Secondary | ICD-10-CM | POA: Diagnosis not present

## 2019-08-25 DIAGNOSIS — I209 Angina pectoris, unspecified: Secondary | ICD-10-CM

## 2019-08-25 LAB — COAGUCHEK XS/INR WAIVED
INR: 3.2 — ABNORMAL HIGH (ref 0.9–1.1)
Prothrombin Time: 38.6 s

## 2019-08-25 NOTE — Progress Notes (Signed)
Chief Complaint  Patient presents with  . Anticoagulation    HPI  Patient presents today for Atrial fibrillation and mitral valve replacement follow up. Pt. is treated with rate control and anticoagulation. Pt.  denies palpitations, rapid rate, chest pain, dyspnea and edema. There has been no bleeding from nose or gums. Pt. has not noticed blood with urine or stool.  Although there is routine bruising easily, it is not excessive.    PMH: Smoking status noted ROS: Per HPI  Objective: BP 126/60   Pulse 76   Temp (!) 96.6 F (35.9 C)   Resp 20   Ht 5\' 11"  (1.803 m)   Wt 211 lb (95.7 kg)   SpO2 99%   BMI 29.43 kg/m  Gen: NAD, alert, cooperative with exam HEENT: NCAT, EOMI, PERRL CV: RRR, good S1/S2, no murmur Resp: CTABL, no wheezes, non-labored Abd: SNTND, BS present, no guarding or organomegaly Ext: No edema, warm Neuro: Alert and oriented, No gross deficits  INR=3.2  Assessment and plan:  1. Longstanding persistent atrial fibrillation ([place])   2. Long term (current) use of anticoagulants [Z79.01]   3. MITRAL VALVE REPLACEMENT, HX OF   4. Encounter for therapeutic drug monitoring   5. Angina pectoris ([place])     No orders of the defined types were placed in this encounter.   Orders Placed This Encounter  Procedures  . inr FINGERSTICK    Follow up as needed.  Claretta Fraise, MD

## 2019-09-14 ENCOUNTER — Telehealth: Payer: Self-pay | Admitting: Cardiology

## 2019-09-14 DIAGNOSIS — I255 Ischemic cardiomyopathy: Secondary | ICD-10-CM

## 2019-09-14 DIAGNOSIS — I5022 Chronic systolic (congestive) heart failure: Secondary | ICD-10-CM

## 2019-09-14 MED ORDER — SACUBITRIL-VALSARTAN 97-103 MG PO TABS: 1.0000 | ORAL_TABLET | Freq: Two times a day (BID) | ORAL | 11 refills | Status: DC

## 2019-09-14 NOTE — Telephone Encounter (Signed)
New Message     Pt c/o medication issue:  1. Name of Medication: sacubitril-valsartan (ENTRESTO) 49-51 MG  2. How are you currently taking this medication (dosage and times per day)? He says "taking the middle dose"  3. Are you having a reaction (difficulty breathing--STAT)? No   4. What is your medication issue?  Pt says he is needing a new prescription to be sent out because he is taking a different dosage and that he is suppose to have an Echo done  He is asking to speak with Pam    Please call

## 2019-09-14 NOTE — Telephone Encounter (Signed)
Documentation from 05/06/2019 appt with Dr Percival Spanish in [place]:  Continue current medications as listed for now.  When you are able Dr Percival Spanish wants you to increase Entresto to 97-103 mg twice a day.  Please let me know when you are ready to do that.  Once you have been taking the increased dose for 3 months, Dr Percival Spanish wants you to have a 2 D Echo.  New RX for Entresto 97-103 mg BID sent into pharmacy as requested.  Order placed for echo in 3 months per Dr Percival Spanish.   Pt is aware of the above and that he will be contacted to be scheduled for 2 D Echo.  Pt is asking for a written RX so his wife can go ahead and start the process of pt assistance for when he goes into the "donut hole." Advised I will ask Dr Percival Spanish about doing this so early - pt states understanding.

## 2019-09-16 MED ORDER — SACUBITRIL-VALSARTAN 97-103 MG PO TABS: 1.0000 | ORAL_TABLET | Freq: Two times a day (BID) | ORAL | 2 refills | Status: DC

## 2019-09-16 NOTE — Telephone Encounter (Signed)
Print RX and signed by Dr Percival Spanish.  Attempted to contact pt at his home #.  NA.  Left message RX ready to be picked up here at the [facility] office.

## 2019-09-22 ENCOUNTER — Encounter: Payer: Self-pay | Admitting: Family Medicine

## 2019-09-22 ENCOUNTER — Other Ambulatory Visit: Payer: Self-pay

## 2019-09-22 ENCOUNTER — Ambulatory Visit (INDEPENDENT_AMBULATORY_CARE_PROVIDER_SITE_OTHER): Payer: PPO | Admitting: Family Medicine

## 2019-09-22 VITALS — BP 138/60 | HR 81 | Temp 96.0°F | Ht 71.0 in | Wt 215.6 lb

## 2019-09-22 DIAGNOSIS — E119 Type 2 diabetes mellitus without complications: Secondary | ICD-10-CM

## 2019-09-22 DIAGNOSIS — Z7901 Long term (current) use of anticoagulants: Secondary | ICD-10-CM

## 2019-09-22 DIAGNOSIS — I4811 Longstanding persistent atrial fibrillation: Secondary | ICD-10-CM | POA: Diagnosis not present

## 2019-09-22 DIAGNOSIS — I10 Essential (primary) hypertension: Secondary | ICD-10-CM | POA: Diagnosis not present

## 2019-09-22 DIAGNOSIS — I25118 Atherosclerotic heart disease of native coronary artery with other forms of angina pectoris: Secondary | ICD-10-CM

## 2019-09-22 DIAGNOSIS — E785 Hyperlipidemia, unspecified: Secondary | ICD-10-CM

## 2019-09-22 DIAGNOSIS — Z794 Long term (current) use of insulin: Secondary | ICD-10-CM | POA: Diagnosis not present

## 2019-09-22 DIAGNOSIS — Z9889 Other specified postprocedural states: Secondary | ICD-10-CM

## 2019-09-22 DIAGNOSIS — I255 Ischemic cardiomyopathy: Secondary | ICD-10-CM | POA: Diagnosis not present

## 2019-09-22 LAB — COAGUCHEK XS/INR WAIVED
INR: 3.3 — ABNORMAL HIGH (ref 0.9–1.1)
Prothrombin Time: 39.1 s

## 2019-09-26 ENCOUNTER — Encounter: Payer: Self-pay | Admitting: Family Medicine

## 2019-09-26 MED ORDER — INSULIN GLARGINE 100 UNIT/ML ~~LOC~~ SOLN: 85.0000 [IU] | Freq: Two times a day (BID) | SUBCUTANEOUS | 5 refills | Status: DC | PRN

## 2019-09-26 MED ORDER — SPIRONOLACTONE 25 MG PO TABS: 25.0000 mg | ORAL_TABLET | Freq: Every day | ORAL | 1 refills | Status: DC

## 2019-09-26 MED ORDER — SITAGLIPTIN PHOSPHATE 50 MG PO TABS: 50.0000 mg | ORAL_TABLET | Freq: Every day | ORAL | 1 refills | Status: DC

## 2019-09-26 MED ORDER — VIAGRA 100 MG PO TABS: 50.0000 mg | ORAL_TABLET | ORAL | 3 refills | Status: DC | PRN

## 2019-09-26 MED ORDER — SACUBITRIL-VALSARTAN 97-103 MG PO TABS: 1.0000 | ORAL_TABLET | Freq: Two times a day (BID) | ORAL | 2 refills | Status: DC

## 2019-09-26 MED ORDER — PRAVASTATIN SODIUM 80 MG PO TABS: ORAL_TABLET | ORAL | 3 refills | Status: DC

## 2019-09-26 MED ORDER — DEXILANT 60 MG PO CPDR: 60.0000 mg | DELAYED_RELEASE_CAPSULE | Freq: Every day | ORAL | 1 refills | Status: DC

## 2019-09-26 MED ORDER — METFORMIN HCL 500 MG PO TABS: 500.0000 mg | ORAL_TABLET | Freq: Two times a day (BID) | ORAL | 1 refills | Status: DC

## 2019-09-26 MED ORDER — ONETOUCH VERIO VI STRP: ORAL_STRIP | 3 refills | Status: DC

## 2019-09-26 MED ORDER — FUROSEMIDE 40 MG PO TABS: 40.0000 mg | ORAL_TABLET | Freq: Every day | ORAL | 1 refills | Status: DC

## 2019-09-26 MED ORDER — WARFARIN SODIUM 5 MG PO TABS: 5.0000 mg | ORAL_TABLET | Freq: Every day | ORAL | 1 refills | Status: DC

## 2019-09-26 MED ORDER — GLIPIZIDE ER 10 MG PO TB24: 10.0000 mg | ORAL_TABLET | Freq: Every day | ORAL | 1 refills | Status: DC

## 2019-09-26 MED ORDER — NITROGLYCERIN 0.4 MG SL SUBL: 0.4000 mg | SUBLINGUAL_TABLET | SUBLINGUAL | 10 refills | Status: DC | PRN

## 2019-09-26 MED ORDER — METOPROLOL SUCCINATE ER 200 MG PO TB24: 200.0000 mg | ORAL_TABLET | Freq: Every day | ORAL | 3 refills | Status: DC

## 2019-09-26 NOTE — Progress Notes (Signed)
Subjective:  Patient ID: John [name], male    DOB: 1950/09/21  Age: 69 y.o. MRN: [id number]  CC: Follow-up (1 month, INR)   HPI John [name] presents for CHF, atrial fib. and  mitral valve replacement follow up. Pt. is treated with rate control and anticoagulation. Pt.  denies palpitations, rapid rate, chest pain, dyspnea and edema. There has been no bleeding from nose or gums. Pt. has not noticed blood with urine or stool.  Although there is routine bruising easily, it is not excessive.   Depression screen John [name] 2/9 08/25/2019 07/27/2019 06/23/2019  Decreased Interest 0 0 0  Down, Depressed, Hopeless 0 0 0  PHQ - 2 Score 0 0 0  Altered sleeping - - -  Tired, decreased energy - - -  Change in appetite - - -  Feeling bad or failure about yourself  - - -  Trouble concentrating - - -  Moving slowly or fidgety/restless - - -  Suicidal thoughts - - -  PHQ-9 Score - - -    History John [name] has a past medical history of Allergy, Anemia, Arthritis, Atrial fibrillation ([place]), Bruises easily, CAD (coronary artery disease), CHF (congestive heart failure) (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   He has a past surgical history that includes Mitral valve replacement (11/2000); laparotomy (N/A, 02/09/2013); Portacath placement (N/A, 02/09/2013); Coronary artery bypass graft (2002); Septoplasty; Application if wound vac (2014); Colonoscopy; Esophagogastroduodenoscopy; Laparoscopic incisional / umbilical / ventral hernia repair (03/15/2014); Port-a-cath removal (03/15/2014); Hernia  repair; Cardiac catheterization (2002); Incisional hernia repair (N/A, 03/15/2014); Port-a-cath removal (Left, 03/15/2014); Insertion of mesh (N/A, 03/15/2014); RIGHT/LEFT HEART CATH AND CORONARY/GRAFT ANGIOGRAPHY (N/A, 05/10/2017); Coronary artery bypass graft; and Small intestine surgery.   His family history includes Arthritis in his brother; Depression in his brother; Diabetes in his brother, brother, and mother; Heart disease in his brother and brother; Hyperlipidemia in his brother and brother; Hypertension in his brother, brother, and father; Thyroid disease in his sister.He reports that he has quit smoking. His smoking use included cigars. He quit after 40.00 years of use. He has never used smokeless tobacco. He reports that he does not drink alcohol or use drugs.    ROS Review of Systems  Constitutional: Negative for fever.  Respiratory: Negative for shortness of breath.   Cardiovascular: Negative for chest pain.  Musculoskeletal: Negative for arthralgias.  Skin: Negative for rash.    Objective:  BP 138/60   Pulse 81   Temp (!) 96 F (35.6 C) (Temporal)   Ht 5\' 11"  (1.803 m)   Wt 215 lb 9.6 oz (97.8 kg)   BMI 30.07 kg/m   BP Readings from Last 3 Encounters:  09/22/19 138/60  08/25/19 126/60  07/27/19 140/63    Wt Readings from Last 3 Encounters:  09/22/19 215 lb 9.6 oz (97.8 kg)  08/25/19 211 lb (95.7 kg)  07/27/19 218 lb (98.9 kg)     Physical Exam Constitutional:      General: He is not in acute distress.    Appearance: He is well-developed.  HENT:     Head: Normocephalic and atraumatic.     Right Ear: External ear normal.  Left Ear: External ear normal.     Nose: Nose normal.  Eyes:     Conjunctiva/sclera: Conjunctivae normal.     Pupils: Pupils are equal, round, and reactive to light.  Cardiovascular:     Rate and Rhythm: Normal rate and regular rhythm.     Heart sounds: Normal heart sounds. No murmur.  Pulmonary:     Effort: Pulmonary effort is normal.  No respiratory distress.     Breath sounds: Normal breath sounds. No wheezing or rales.  Abdominal:     Palpations: Abdomen is soft.     Tenderness: There is no abdominal tenderness.  Musculoskeletal:        General: Normal range of motion.     Cervical back: Normal range of motion and neck supple.  Skin:    General: Skin is warm and dry.  Neurological:     Mental Status: He is alert and oriented to person, place, and time.     Deep Tendon Reflexes: Reflexes are normal and symmetric.  Psychiatric:        Behavior: Behavior normal.        Thought Content: Thought content normal.        Judgment: Judgment normal.       Assessment & Plan:   John [name] was seen today for follow-up.  Diagnoses and all orders for this visit:  Essential hypertension -     metoprolol (TOPROL-XL) 200 MG 24 hr tablet; Take 1 tablet (200 mg total) by mouth daily after breakfast.  Long term (current) use of anticoagulants -     CoaguChek XS/INR Waived -     warfarin (COUMADIN) 5 MG tablet; Take 1 tablet (5 mg total) by mouth daily.  Longstanding persistent atrial fibrillation (HCC) -     CoaguChek XS/INR Waived -     warfarin (COUMADIN) 5 MG tablet; Take 1 tablet (5 mg total) by mouth daily.  MITRAL VALVE REPLACEMENT, HX OF -     CoaguChek XS/INR Waived -     warfarin (COUMADIN) 5 MG tablet; Take 1 tablet (5 mg total) by mouth daily.  Coronary artery disease of native artery of native heart with stable angina pectoris (HCC) -     nitroGLYCERIN (NITROSTAT) 0.4 MG SL tablet; Place 1 tablet (0.4 mg total) under the tongue every 5 (five) minutes as needed for chest pain. -     metoprolol (TOPROL-XL) 200 MG 24 hr tablet; Take 1 tablet (200 mg total) by mouth daily after breakfast.  Ischemic cardiomyopathy -     sacubitril-valsartan (ENTRESTO) 97-103 MG; Take 1 tablet by mouth 2 (two) times daily. -     spironolactone (ALDACTONE) 25 MG tablet; Take 1 tablet (25 mg total) by mouth daily. -     furosemide  (LASIX) 40 MG tablet; Take 1 tablet (40 mg total) by mouth daily. -     metoprolol (TOPROL-XL) 200 MG 24 hr tablet; Take 1 tablet (200 mg total) by mouth daily after breakfast.  Type 2 diabetes mellitus without complication, with long-term current use of insulin (HCC) -     sitaGLIPtin (JANUVIA) 50 MG tablet; Take 1 tablet (50 mg total) by mouth daily. -     glipiZIDE (GLUCOTROL XL) 10 MG 24 hr tablet; Take 1 tablet (10 mg total) by mouth daily. -     glucose blood (ONETOUCH VERIO) test strip; Test BS BID Dx E11.9 -     insulin glargine (LANTUS) 100 UNIT/ML injection; Inject 0.85 mLs (85 Units total) into the  skin 2 (two) times daily as needed (takes 85 units every night at bedtime 2 nd dose only if needed for elevated blood sugars). -     metFORMIN (GLUCOPHAGE) 500 MG tablet; Take 1 tablet (500 mg total) by mouth 2 (two) times daily with a meal.  Hyperlipidemia, unspecified hyperlipidemia type -     pravastatin (PRAVACHOL) 80 MG tablet; TAKE 1 TABLET BY MOUTH DAILY EVERY EVENING  Other orders -     VIAGRA 100 MG tablet; Take 0.5 tablets (50 mg total) by mouth as needed. -     dexlansoprazole (DEXILANT) 60 MG capsule; Take 1 capsule (60 mg total) by mouth daily.       I have changed Rogers L. Purdy's Viagra and nitroGLYCERIN. I am also having him maintain his Osteo Bi-Flex Adv Triple St, aspirin, triamcinolone cream, sacubitril-valsartan, sitaGLIPtin, spironolactone, warfarin, pravastatin, Dexilant, furosemide, glipiZIDE, OneTouch Verio, insulin glargine, metFORMIN, and metoprolol.  Allergies as of 09/22/2019      Reactions   Doxycycline Hives      Medication List       Accurate as of September 22, 2019 11:59 PM. If you have any questions, ask your nurse or doctor.        aspirin 81 MG chewable tablet Chew 81 mg by mouth daily.   Dexilant 60 MG capsule Generic drug: dexlansoprazole Take 1 capsule (60 mg total) by mouth daily.   furosemide 40 MG tablet Commonly known as:  LASIX Take 1 tablet (40 mg total) by mouth daily.   glipiZIDE 10 MG 24 hr tablet Commonly known as: GLUCOTROL XL Take 1 tablet (10 mg total) by mouth daily.   insulin glargine 100 UNIT/ML injection Commonly known as: LANTUS Inject 0.85 mLs (85 Units total) into the skin 2 (two) times daily as needed (takes 85 units every night at bedtime 2 nd dose only if needed for elevated blood sugars).   metFORMIN 500 MG tablet Commonly known as: GLUCOPHAGE Take 1 tablet (500 mg total) by mouth 2 (two) times daily with a meal.   metoprolol 200 MG 24 hr tablet Commonly known as: TOPROL-XL Take 1 tablet (200 mg total) by mouth daily after breakfast.   nitroGLYCERIN 0.4 MG SL tablet Commonly known as: NITROSTAT Place 1 tablet (0.4 mg total) under the tongue every 5 (five) minutes as needed for chest pain.   OneTouch Verio test strip Generic drug: glucose blood Test BS BID Dx E11.9   Osteo Bi-Flex Adv Triple St Tabs Take 500 each by mouth daily.   pravastatin 80 MG tablet Commonly known as: PRAVACHOL TAKE 1 TABLET BY MOUTH DAILY EVERY EVENING   sacubitril-valsartan 97-103 MG Commonly known as: ENTRESTO Take 1 tablet by mouth 2 (two) times daily.   sitaGLIPtin 50 MG tablet Commonly known as: JANUVIA Take 1 tablet (50 mg total) by mouth daily.   spironolactone 25 MG tablet Commonly known as: ALDACTONE Take 1 tablet (25 mg total) by mouth daily.   triamcinolone cream 0.1 % Commonly known as: KENALOG Apply 1 application topically 2 (two) times daily. Avoid face and genitalia   Viagra 100 MG tablet Generic drug: sildenafil Take 0.5 tablets (50 mg total) by mouth as needed.   warfarin 5 MG tablet Commonly known as: COUMADIN Take as directed by the anticoagulation clinic. If you are unsure how to take this medication, talk to your nurse or doctor. Original instructions: Take 1 tablet (5 mg total) by mouth daily.      Patient with multiple cardiac diagnoses.  These include atrial  fibrillation and a mitral valve replacement which require anticoagulation.  Today he is in a good range with 3.3 for his INR.  He was recently seen by cardiology and his dose of Entresto was increased.  He has been having some swelling and furosemide was increased for a week.  That seems to be stabilizing his problem.  Meanwhile he will be due next month for his full diabetes check including A1c.  He is to be monitoring his blood sugar daily as well.  Carb controlled low-sodium diet emphasized.  Follow-up: Return in about 1 month (around 10/23/2019) for diabetes, hypertension.  Claretta Fraise, M.D.

## 2019-10-20 ENCOUNTER — Other Ambulatory Visit: Payer: Self-pay

## 2019-10-20 ENCOUNTER — Ambulatory Visit (INDEPENDENT_AMBULATORY_CARE_PROVIDER_SITE_OTHER): Payer: PPO

## 2019-10-20 ENCOUNTER — Encounter: Payer: Self-pay | Admitting: Family Medicine

## 2019-10-20 ENCOUNTER — Ambulatory Visit (INDEPENDENT_AMBULATORY_CARE_PROVIDER_SITE_OTHER): Payer: PPO | Admitting: Family Medicine

## 2019-10-20 VITALS — BP 125/61 | HR 64 | Temp 96.0°F | Ht 71.0 in | Wt 213.2 lb

## 2019-10-20 DIAGNOSIS — R609 Edema, unspecified: Secondary | ICD-10-CM

## 2019-10-20 DIAGNOSIS — M79671 Pain in right foot: Secondary | ICD-10-CM

## 2019-10-20 DIAGNOSIS — M7731 Calcaneal spur, right foot: Secondary | ICD-10-CM | POA: Diagnosis not present

## 2019-10-20 MED ORDER — KETOROLAC TROMETHAMINE 60 MG/2ML IM SOLN
60.0000 mg | Freq: Once | INTRAMUSCULAR | Status: AC
  Administered 2019-10-20: 09:00:00 60 mg via INTRAMUSCULAR

## 2019-10-20 NOTE — Progress Notes (Signed)
Assessment & Plan:  1. Acute pain of right foot - Advised to take Ibuprofen 400-600 mg WITH Tylenol 500 mg every 6-8 hours as needed for pain, starting tomorrow. I advised to only take Tylenol today due to receiving Toradol today. Encouraged elevated of lower extremities. Patient insists on x-ray of foot. Post op shoe applied before leaving.  - DG Foot Complete Right - ketorolac (TORADOL) injection 60 mg  2. Dependent edema - Encouraged elevation of the lower extremities above the level of the heart, low salt diet (which patient reports he is already doing), walking, and application of compression hose. Education provided on edema. Patient is going to increase Lasix to 40 mg BID for a few days while he is having increased swelling.    Follow up plan: Return if symptoms worsen or fail to improve.  Hendricks Limes, MSN, APRN, FNP-C [facility]  Subjective:   Patient ID: John [name], male    DOB: 09-09-1951, 69 y.o.   MRN: [id number]  HPI: John [name] is a 69 y.o. male presenting on 10/20/2019 for Foot Pain (right. x 1 week )  Patient c/o right foot pain in the bottom of his foot at the ball and heel. Additionally has swelling up to his knee. He describes the pain as shooting and throbbing; rating it 10/10. He reports he was on a ladder about a week ago for ~30 minutes and feels this is what caused the pain. He believes the pressure from standing on the ladder the way he was caused a small fracture. There has been no injury. He is unable to sleep due to the pain. He has not taken any medication to treat pain at home. He has been sitting with his legs in a dependent position. He has not been getting up moving around due to the pain. He takes Lasix 20 mg BID. He reports he is supposed to increase to 40 mg BID per PCP when he is having swelling but has not done so.    ROS: Negative unless specifically indicated above in HPI.   Relevant past medical history reviewed  and updated as indicated.   Allergies and medications reviewed and updated.   Current Outpatient Medications:  .  aspirin 81 MG chewable tablet, Chew 81 mg by mouth daily., Disp: , Rfl:  .  dexlansoprazole (DEXILANT) 60 MG capsule, Take 1 capsule (60 mg total) by mouth daily., Disp: 90 capsule, Rfl: 1 .  furosemide (LASIX) 40 MG tablet, Take 1 tablet (40 mg total) by mouth daily., Disp: 90 tablet, Rfl: 1 .  glipiZIDE (GLUCOTROL XL) 10 MG 24 hr tablet, Take 1 tablet (10 mg total) by mouth daily., Disp: 90 tablet, Rfl: 1 .  glucose blood (ONETOUCH VERIO) test strip, Test BS BID Dx E11.9, Disp: 200 each, Rfl: 3 .  insulin glargine (LANTUS) 100 UNIT/ML injection, Inject 0.85 mLs (85 Units total) into the skin 2 (two) times daily as needed (takes 85 units every night at bedtime 2 nd dose only if needed for elevated blood sugars)., Disp: 10 mL, Rfl: 5 .  metFORMIN (GLUCOPHAGE) 500 MG tablet, Take 1 tablet (500 mg total) by mouth 2 (two) times daily with a meal., Disp: 180 tablet, Rfl: 1 .  metoprolol (TOPROL-XL) 200 MG 24 hr tablet, Take 1 tablet (200 mg total) by mouth daily after breakfast., Disp: 90 tablet, Rfl: 3 .  Misc Natural Products (OSTEO BI-FLEX ADV TRIPLE ST) TABS, Take 500 each by mouth daily. , Disp: ,  Rfl:  .  nitroGLYCERIN (NITROSTAT) 0.4 MG SL tablet, Place 1 tablet (0.4 mg total) under the tongue every 5 (five) minutes as needed for chest pain., Disp: 50 tablet, Rfl: 10 .  pravastatin (PRAVACHOL) 80 MG tablet, TAKE 1 TABLET BY MOUTH DAILY EVERY EVENING, Disp: 90 tablet, Rfl: 3 .  sacubitril-valsartan (ENTRESTO) 97-103 MG, Take 1 tablet by mouth 2 (two) times daily., Disp: 180 tablet, Rfl: 2 .  sitaGLIPtin (JANUVIA) 50 MG tablet, Take 1 tablet (50 mg total) by mouth daily., Disp: 90 tablet, Rfl: 1 .  spironolactone (ALDACTONE) 25 MG tablet, Take 1 tablet (25 mg total) by mouth daily., Disp: 90 tablet, Rfl: 1 .  triamcinolone cream (KENALOG) 0.1 %, Apply 1 application topically 2 (two)  times daily. Avoid face and genitalia, Disp: 45 g, Rfl: 2 .  VIAGRA 100 MG tablet, Take 0.5 tablets (50 mg total) by mouth as needed., Disp: 10 tablet, Rfl: 3 .  warfarin (COUMADIN) 5 MG tablet, Take 1 tablet (5 mg total) by mouth daily., Disp: 90 tablet, Rfl: 1  Current Facility-Administered Medications:  .  ketorolac (TORADOL) injection 60 mg, 60 mg, Intramuscular, Once, Loman Brooklyn, FNP  Allergies  Allergen Reactions  . Doxycycline Hives    Objective:   BP 125/61   Pulse 64   Temp (!) 96 F (35.6 C) (Temporal)   Ht 5\' 11"  (1.803 m)   Wt 213 lb 3.2 oz (96.7 kg)   SpO2 100%   BMI 29.74 kg/m    Physical Exam Vitals reviewed.  Constitutional:      General: He is not in acute distress.    Appearance: Normal appearance. He is not ill-appearing, toxic-appearing or diaphoretic.  HENT:     Head: Normocephalic and atraumatic.  Eyes:     General: No scleral icterus.       Right eye: No discharge.        Left eye: No discharge.     Conjunctiva/sclera: Conjunctivae normal.  Cardiovascular:     Rate and Rhythm: Normal rate.  Pulmonary:     Effort: Pulmonary effort is normal. No respiratory distress.  Musculoskeletal:        General: Normal range of motion.     Cervical back: Normal range of motion.  Feet:     Comments: Right foot pain over ball of foot and heel. 2+ pitting edema of right leg up to the knee. No erythema or warmth.  Skin:    General: Skin is warm and dry.  Neurological:     Mental Status: He is alert and oriented to person, place, and time. Mental status is at baseline.  Psychiatric:        Mood and Affect: Mood normal.        Behavior: Behavior normal.        Thought Content: Thought content normal.        Judgment: Judgment normal.

## 2019-10-20 NOTE — Patient Instructions (Addendum)
Ibuprofen 400-600 mg WITH Tylenol 500 mg every 6-8 hours as needed for pain. Do not start Ibuprofen until tomorrow since we are giving you Toradol today.    Edema  Edema is when you have too much fluid in your body or under your skin. Edema may make your legs, feet, and ankles swell up. Swelling is also common in looser tissues, like around your eyes. This is a common condition. It gets more common as you get older. There are many possible causes of edema. Eating too much salt (sodium) and being on your feet or sitting for a long time can cause edema in your legs, feet, and ankles. Hot weather may make edema worse. Edema is usually painless. Your skin may look swollen or shiny. Follow these instructions at home:  Keep the swollen body part raised (elevated) above the level of your heart when you are sitting or lying down.  Do not sit still or stand for a long time.  Do not wear tight clothes. Do not wear garters on your upper legs.  Exercise your legs. This can help the swelling go down.  Wear elastic bandages or support stockings as told by your doctor.  Eat a low-salt (low-sodium) diet to reduce fluid as told by your doctor.  Depending on the cause of your swelling, you may need to limit how much fluid you drink (fluid restriction).  Take over-the-counter and prescription medicines only as told by your doctor. Contact a doctor if:  Treatment is not working.  You have heart, liver, or kidney disease and have symptoms of edema.  You have sudden and unexplained weight gain. Get help right away if:  You have shortness of breath or chest pain.  You cannot breathe when you lie down.  You have pain, redness, or warmth in the swollen areas.  You have heart, liver, or kidney disease and get edema all of a sudden.  You have a fever and your symptoms get worse all of a sudden. Summary  Edema is when you have too much fluid in your body or under your skin.  Edema may make your  legs, feet, and ankles swell up. Swelling is also common in looser tissues, like around your eyes.  Raise (elevate) the swollen body part above the level of your heart when you are sitting or lying down.  Follow your doctor's instructions about diet and how much fluid you can drink (fluid restriction). This information is not intended to replace advice given to you by your health care provider. Make sure you discuss any questions you have with your health care provider. Document Revised: 08/30/2017 Document Reviewed: 09/14/2016 Elsevier Patient Education  2020 [employer].

## 2019-10-23 ENCOUNTER — Telehealth: Payer: Self-pay | Admitting: Family Medicine

## 2019-10-23 NOTE — Chronic Care Management (AMB) (Signed)
  Chronic Care Management   Outreach Note  10/23/2019 Name: John [name] MRN: [id number] DOB: Apr 03, 1951  John [name] is a 69 y.o. year old male who is a primary care patient of Stacks, Cletus Gash, MD. I reached out to [place] by phone today in response to a referral sent by John [name]'s health plan.     An unsuccessful telephone outreach was attempted today. The patient was referred to the case management team for assistance with care management and care coordination.   Follow Up Plan: A HIPPA compliant phone message was left for the patient providing contact information and requesting a return call.  The care management team will reach out to the patient again over the next 7 days.  If patient returns call to provider office, please advise to call [facility] at [place], [place], [place], [place] [address] Direct Dial: [phone] [email or web address] Website: [email or web address]

## 2019-10-26 ENCOUNTER — Other Ambulatory Visit: Payer: PPO

## 2019-10-26 ENCOUNTER — Other Ambulatory Visit: Payer: Self-pay

## 2019-10-26 DIAGNOSIS — I4811 Longstanding persistent atrial fibrillation: Secondary | ICD-10-CM | POA: Diagnosis not present

## 2019-10-26 DIAGNOSIS — E119 Type 2 diabetes mellitus without complications: Secondary | ICD-10-CM

## 2019-10-26 DIAGNOSIS — Z7901 Long term (current) use of anticoagulants: Secondary | ICD-10-CM

## 2019-10-26 DIAGNOSIS — Z9889 Other specified postprocedural states: Secondary | ICD-10-CM | POA: Diagnosis not present

## 2019-10-26 DIAGNOSIS — Z794 Long term (current) use of insulin: Secondary | ICD-10-CM | POA: Diagnosis not present

## 2019-10-26 LAB — BAYER DCA HB A1C WAIVED: HB A1C (BAYER DCA - WAIVED): 7.6 % — ABNORMAL HIGH (ref <7.0)

## 2019-10-26 NOTE — Chronic Care Management (AMB) (Signed)
  Chronic Care Management   Outreach Note  10/26/2019 Name: John [name] MRN: [id number] DOB: 09-17-1950  John [name] is a 69 y.o. year old male who is a primary care patient of Stacks, Cletus Gash, MD. I reached out to John [name] by phone today in response to a referral sent by John [name] health plan.     A second unsuccessful telephone outreach was attempted today. The patient was referred to the case management team for assistance with care management and care coordination.   Follow Up Plan: A HIPPA compliant phone message was left for the patient providing contact information and requesting a return call.  The care management team will reach out to the patient again over the next 7 days.  If patient returns call to provider office, please advise to call [facility] at [place], [place], [place], [place] [address] Direct Dial: [phone] [email or web address] Website: [email or web address]

## 2019-10-27 ENCOUNTER — Ambulatory Visit (INDEPENDENT_AMBULATORY_CARE_PROVIDER_SITE_OTHER): Payer: PPO | Admitting: Family Medicine

## 2019-10-27 ENCOUNTER — Other Ambulatory Visit: Payer: Self-pay | Admitting: Family Medicine

## 2019-10-27 ENCOUNTER — Encounter: Payer: Self-pay | Admitting: Family Medicine

## 2019-10-27 VITALS — BP 133/67 | HR 70 | Temp 96.8°F | Ht 71.0 in | Wt 218.2 lb

## 2019-10-27 DIAGNOSIS — I4811 Longstanding persistent atrial fibrillation: Secondary | ICD-10-CM

## 2019-10-27 DIAGNOSIS — I10 Essential (primary) hypertension: Secondary | ICD-10-CM

## 2019-10-27 DIAGNOSIS — Z7901 Long term (current) use of anticoagulants: Secondary | ICD-10-CM

## 2019-10-27 DIAGNOSIS — Z794 Long term (current) use of insulin: Secondary | ICD-10-CM

## 2019-10-27 DIAGNOSIS — E119 Type 2 diabetes mellitus without complications: Secondary | ICD-10-CM

## 2019-10-27 DIAGNOSIS — Z9889 Other specified postprocedural states: Secondary | ICD-10-CM | POA: Diagnosis not present

## 2019-10-27 DIAGNOSIS — I255 Ischemic cardiomyopathy: Secondary | ICD-10-CM | POA: Diagnosis not present

## 2019-10-27 DIAGNOSIS — I25118 Atherosclerotic heart disease of native coronary artery with other forms of angina pectoris: Secondary | ICD-10-CM

## 2019-10-27 DIAGNOSIS — E785 Hyperlipidemia, unspecified: Secondary | ICD-10-CM

## 2019-10-27 LAB — CBC WITH DIFFERENTIAL/PLATELET
Basophils Absolute: 0.1 10*3/uL (ref 0.0–0.2)
Basos: 1 %
EOS (ABSOLUTE): 0.2 10*3/uL (ref 0.0–0.4)
Eos: 2 %
Hematocrit: 41.2 % (ref 37.5–51.0)
Hemoglobin: 13.6 g/dL (ref 13.0–17.7)
Immature Grans (Abs): 0 10*3/uL (ref 0.0–0.1)
Immature Granulocytes: 0 %
Lymphocytes Absolute: 1.3 10*3/uL (ref 0.7–3.1)
Lymphs: 15 %
MCH: 30.9 pg (ref 26.6–33.0)
MCHC: 33 g/dL (ref 31.5–35.7)
MCV: 94 fL (ref 79–97)
Monocytes Absolute: 0.9 10*3/uL (ref 0.1–0.9)
Monocytes: 11 %
Neutrophils Absolute: 5.8 10*3/uL (ref 1.4–7.0)
Neutrophils: 71 %
Platelets: 303 10*3/uL (ref 150–450)
RBC: 4.4 x10E6/uL (ref 4.14–5.80)
RDW: 13.5 % (ref 11.6–15.4)
WBC: 8.3 10*3/uL (ref 3.4–10.8)

## 2019-10-27 LAB — CMP14+EGFR
ALT: 10 IU/L (ref 0–44)
AST: 19 IU/L (ref 0–40)
Albumin/Globulin Ratio: 1.4 (ref 1.2–2.2)
Albumin: 3.9 g/dL (ref 3.8–4.8)
Alkaline Phosphatase: 116 IU/L (ref 39–117)
BUN/Creatinine Ratio: 39 — ABNORMAL HIGH (ref 10–24)
BUN: 47 mg/dL — ABNORMAL HIGH (ref 8–27)
Bilirubin Total: 0.2 mg/dL (ref 0.0–1.2)
CO2: 23 mmol/L (ref 20–29)
Calcium: 9.3 mg/dL (ref 8.6–10.2)
Chloride: 103 mmol/L (ref 96–106)
Creatinine, Ser: 1.2 mg/dL (ref 0.76–1.27)
GFR calc Af Amer: 72 mL/min/{1.73_m2} (ref >59)
GFR calc non Af Amer: 63 mL/min/{1.73_m2} (ref >59)
Globulin, Total: 2.8 g/dL (ref 1.5–4.5)
Glucose: 79 mg/dL (ref 65–99)
Potassium: 5.1 mmol/L (ref 3.5–5.2)
Sodium: 142 mmol/L (ref 134–144)
Total Protein: 6.7 g/dL (ref 6.0–8.5)

## 2019-10-27 LAB — LIPID PANEL
Chol/HDL Ratio: 4.6 ratio (ref 0.0–5.0)
Cholesterol, Total: 129 mg/dL (ref 100–199)
HDL: 28 mg/dL — ABNORMAL LOW (ref >39)
LDL Chol Calc (NIH): 77 mg/dL (ref 0–99)
Triglycerides: 132 mg/dL (ref 0–149)
VLDL Cholesterol Cal: 24 mg/dL (ref 5–40)

## 2019-10-27 LAB — COAGUCHEK XS/INR WAIVED
INR: 2.9 — ABNORMAL HIGH (ref 0.9–1.1)
Prothrombin Time: 34.5 s

## 2019-10-27 MED ORDER — SPIRONOLACTONE 25 MG PO TABS: 25.0000 mg | ORAL_TABLET | Freq: Every day | ORAL | 1 refills | Status: DC

## 2019-10-27 MED ORDER — SACUBITRIL-VALSARTAN 97-103 MG PO TABS: 1.0000 | ORAL_TABLET | Freq: Two times a day (BID) | ORAL | 2 refills | Status: DC

## 2019-10-27 MED ORDER — SITAGLIPTIN PHOSPHATE 50 MG PO TABS: 50.0000 mg | ORAL_TABLET | Freq: Every day | ORAL | 1 refills | Status: DC

## 2019-10-27 MED ORDER — GLIPIZIDE ER 10 MG PO TB24: 10.0000 mg | ORAL_TABLET | Freq: Every day | ORAL | 1 refills | Status: DC

## 2019-10-27 MED ORDER — WARFARIN SODIUM 5 MG PO TABS: 5.0000 mg | ORAL_TABLET | Freq: Every day | ORAL | 1 refills | Status: DC

## 2019-10-27 MED ORDER — FUROSEMIDE 40 MG PO TABS: 40.0000 mg | ORAL_TABLET | Freq: Every day | ORAL | 1 refills | Status: DC

## 2019-10-27 MED ORDER — METFORMIN HCL 500 MG PO TABS: 500.0000 mg | ORAL_TABLET | Freq: Two times a day (BID) | ORAL | 1 refills | Status: DC

## 2019-10-27 MED ORDER — METOPROLOL SUCCINATE ER 200 MG PO TB24: 200.0000 mg | ORAL_TABLET | Freq: Every day | ORAL | 3 refills | Status: DC

## 2019-10-27 MED ORDER — INSULIN GLARGINE 100 UNIT/ML ~~LOC~~ SOLN: 85.0000 [IU] | Freq: Two times a day (BID) | SUBCUTANEOUS | 5 refills | Status: DC | PRN

## 2019-10-27 MED ORDER — PRAVASTATIN SODIUM 80 MG PO TABS: ORAL_TABLET | ORAL | 3 refills | Status: DC

## 2019-10-27 MED ORDER — DEXILANT 60 MG PO CPDR: 60.0000 mg | DELAYED_RELEASE_CAPSULE | Freq: Every day | ORAL | 1 refills | Status: DC

## 2019-10-27 NOTE — Progress Notes (Signed)
Subjective:  Patient ID: John [name],  male    DOB: [birth year]  Age: 69 y.o.    CC: Follow-up   HPI Jorma L Essex presents for  follow-up of hypertension. Patient has no history of headache chest pain or shortness of breath or recent cough. Patient also denies symptoms of TIA such as numbness weakness lateralizing. Patient denies side effects from medication. States taking it regularly.  Patient also  in for follow-up of elevated cholesterol. Doing well without complaints on current medication. Denies side effects  including myalgia and arthralgia and nausea. Also in today for liver function testing. Currently no chest pain, shortness of breath or other cardiovascular related symptoms noted.  Follow-up of diabetes. Patient does check blood sugar at home. Readings run mid 100s Patient denies symptoms such as excessive hunger or urinary frequency, excessive hunger, nausea No significant hypoglycemic spells noted.  Patient says he is still drinking a lot of diet s. he is drinking a fair amount of water as well.  He had an that he has been eating too many sweets and has decreased his activity level due to the pain.  In his knees.  The pain however is improving. Medications reviewed. Pt reports taking them regularly. Pt. denies complication/adverse reaction today.   Atrial fibrillation and mechanical mitral valve follow up. Pt. is treated with rate control and anticoagulation. Pt.  denies palpitations, rapid rate, chest pain, dyspnea and edema. There has been no bleeding from nose or gums. Pt. has not noticed blood with urine or stool.  Although there is routine bruising easily, it is not excessive.    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   He has a past surgical history that includes Mitral valve replacement (11/2000); laparotomy (N/A, 02/09/2013); Portacath placement (N/A, 02/09/2013); Coronary artery bypass graft (2002); Septoplasty; Application if wound vac (2014); Colonoscopy; Esophagogastroduodenoscopy; Laparoscopic incisional / umbilical / ventral hernia repair (03/15/2014); Port-a-cath removal (03/15/2014); Hernia repair; Cardiac catheterization (2002); Incisional hernia repair (N/A, 03/15/2014); Port-a-cath removal (Left, 03/15/2014); Insertion of mesh (N/A, 03/15/2014); RIGHT/LEFT HEART CATH AND CORONARY/GRAFT ANGIOGRAPHY (N/A, 05/10/2017); Coronary artery bypass graft; and Small intestine surgery.   His family history includes Arthritis in his brother; Depression in his brother; Diabetes in his brother, brother, and mother; Heart disease in his brother and brother; Hyperlipidemia in his brother and brother; Hypertension in his brother, brother, and father; Thyroid disease in his sister.He reports that he has quit smoking. His smoking use included cigars. He quit after 40.00 years of use. He has never used smokeless tobacco. He reports that he does not drink alcohol or use drugs.  Current Outpatient Medications on File Prior to Visit  Medication Sig Dispense Refill  . aspirin 81 MG chewable tablet Chew 81 mg by mouth daily.    Marland Kitchen glucose blood (ONETOUCH VERIO) test strip Test BS BID Dx E11.9 200 each 3  . Misc Natural Products (OSTEO BI-FLEX ADV TRIPLE ST) TABS Take 500 each by mouth daily.     . nitroGLYCERIN (NITROSTAT) 0.4 MG SL tablet Place 1  tablet (0.4 mg total) under the tongue every 5  (five) minutes as needed for chest pain. 50 tablet 10  . triamcinolone cream (KENALOG) 0.1 % Apply 1 application topically 2 (two) times daily. Avoid face and genitalia 45 g 2  . VIAGRA 100 MG tablet Take 0.5 tablets (50 mg total) by mouth as needed. 10 tablet 3   No current facility-administered medications on file prior to visit.    ROS Review of Systems  Constitutional: Negative.   HENT: Negative.   Eyes: Negative for visual disturbance.  Respiratory: Negative for cough and shortness of breath.   Cardiovascular: Negative for chest pain and leg swelling.  Gastrointestinal: Negative for abdominal pain, diarrhea, nausea and vomiting.  Genitourinary: Negative for difficulty urinating.  Musculoskeletal: Negative for arthralgias and myalgias.  Skin: Negative for rash.  Neurological: Negative for headaches.  Psychiatric/Behavioral: Negative for sleep disturbance.    Objective:  BP 133/67   Pulse 70   Temp (!) 96.8 F (36 C) (Temporal)   Ht 5\' 11"  (1.803 m)   Wt 218 lb 3.2 oz (99 kg)   BMI 30.43 kg/m   BP Readings from Last 3 Encounters:  10/27/19 133/67  10/20/19 125/61  09/22/19 138/60    Wt Readings from Last 3 Encounters:  10/27/19 218 lb 3.2 oz (99 kg)  10/20/19 213 lb 3.2 oz (96.7 kg)  09/22/19 215 lb 9.6 oz (97.8 kg)   Physical Exam Vitals reviewed.  Constitutional:      Appearance: He is well-developed.  HENT:     Head: Normocephalic and atraumatic.     Right Ear: Tympanic membrane and external ear normal. No decreased hearing noted.     Left Ear: Tympanic membrane and external ear normal. No decreased hearing noted.     Mouth/Throat:     Pharynx: No oropharyngeal exudate or posterior oropharyngeal erythema.  Eyes:     Pupils: Pupils are equal, round, and reactive to light.  Cardiovascular:     Rate and Rhythm: Normal rate and regular rhythm.     Heart sounds: No murmur.  Pulmonary:     Effort: No respiratory distress.     Breath sounds: Normal breath  sounds.  Abdominal:     General: Bowel sounds are normal.     Palpations: Abdomen is soft. There is no mass.     Tenderness: There is no abdominal tenderness.  Musculoskeletal:     Cervical back: Normal range of motion and neck supple.        Results for orders placed or performed in visit on 10/27/19  CoaguChek XS/INR Waived  Result Value Ref Range   INR 2.9 (H) 0.9 - 1.1   Prothrombin Time 34.5 sec        Assessment & Plan:   Egor was seen today for follow-up.  Diagnoses and all orders for this visit:  Longstanding persistent atrial fibrillation (HCC) -     CoaguChek XS/INR Waived -     warfarin (COUMADIN) 5 MG tablet; Take 1 tablet (5 mg total) by mouth daily.  MITRAL VALVE REPLACEMENT, HX OF -     warfarin (COUMADIN) 5 MG tablet; Take 1 tablet (5 mg total) by mouth daily.  Long term (current) use of anticoagulants [Z79.01] -     warfarin (COUMADIN) 5 MG tablet; Take 1 tablet (5 mg total) by mouth daily.  Hyperlipidemia, unspecified hyperlipidemia type -     pravastatin (PRAVACHOL) 80 MG tablet; TAKE 1 TABLET BY MOUTH DAILY EVERY EVENING  Type 2  diabetes mellitus without complication, with long-term current use of insulin (HCC) -     glipiZIDE (GLUCOTROL XL) 10 MG 24 hr tablet; Take 1 tablet (10 mg total) by mouth daily. -     insulin glargine (LANTUS) 100 UNIT/ML injection; Inject 0.85 mLs (85 Units total) into the skin 2 (two) times daily as needed (takes 85 units every night at bedtime 2 nd dose only if needed for elevated blood sugars). -     metFORMIN (GLUCOPHAGE) 500 MG tablet; Take 1 tablet (500 mg total) by mouth 2 (two) times daily with a meal. -     sitaGLIPtin (JANUVIA) 50 MG tablet; Take 1 tablet (50 mg total) by mouth daily.  Essential hypertension -     metoprolol (TOPROL-XL) 200 MG 24 hr tablet; Take 1 tablet (200 mg total) by mouth daily after breakfast.  Ischemic cardiomyopathy -     furosemide (LASIX) 40 MG tablet; Take 1 tablet (40 mg  total) by mouth daily. -     metoprolol (TOPROL-XL) 200 MG 24 hr tablet; Take 1 tablet (200 mg total) by mouth daily after breakfast. -     sacubitril-valsartan (ENTRESTO) 97-103 MG; Take 1 tablet by mouth 2 (two) times daily. -     spironolactone (ALDACTONE) 25 MG tablet; Take 1 tablet (25 mg total) by mouth daily.  Coronary artery disease of native artery of native heart with stable angina pectoris (HCC) -     metoprolol (TOPROL-XL) 200 MG 24 hr tablet; Take 1 tablet (200 mg total) by mouth daily after breakfast.  Long term (current) use of anticoagulants -     warfarin (COUMADIN) 5 MG tablet; Take 1 tablet (5 mg total) by mouth daily.  Other orders -     dexlansoprazole (DEXILANT) 60 MG capsule; Take 1 capsule (60 mg total) by mouth daily.   I am having Tien L. Lucci maintain his Osteo Bi-Flex Adv Triple St, aspirin, triamcinolone cream, Viagra, nitroGLYCERIN, OneTouch Verio, Dexilant, furosemide, glipiZIDE, insulin glargine, metFORMIN, metoprolol, pravastatin, sacubitril-valsartan, sitaGLIPtin, spironolactone, and warfarin.  Meds ordered this encounter  Medications  . dexlansoprazole (DEXILANT) 60 MG capsule    Sig: Take 1 capsule (60 mg total) by mouth daily.    Dispense:  90 capsule    Refill:  1  . furosemide (LASIX) 40 MG tablet    Sig: Take 1 tablet (40 mg total) by mouth daily.    Dispense:  90 tablet    Refill:  1  . glipiZIDE (GLUCOTROL XL) 10 MG 24 hr tablet    Sig: Take 1 tablet (10 mg total) by mouth daily.    Dispense:  90 tablet    Refill:  1  . insulin glargine (LANTUS) 100 UNIT/ML injection    Sig: Inject 0.85 mLs (85 Units total) into the skin 2 (two) times daily as needed (takes 85 units every night at bedtime 2 nd dose only if needed for elevated blood sugars).    Dispense:  10 mL    Refill:  5  . metFORMIN (GLUCOPHAGE) 500 MG tablet    Sig: Take 1 tablet (500 mg total) by mouth 2 (two) times daily with a meal.    Dispense:  180 tablet    Refill:  1  .  metoprolol (TOPROL-XL) 200 MG 24 hr tablet    Sig: Take 1 tablet (200 mg total) by mouth daily after breakfast.    Dispense:  90 tablet    Refill:  3  . pravastatin (PRAVACHOL) 80 MG tablet  Sig: TAKE 1 TABLET BY MOUTH DAILY EVERY EVENING    Dispense:  90 tablet    Refill:  3  . sacubitril-valsartan (ENTRESTO) 97-103 MG    Sig: Take 1 tablet by mouth 2 (two) times daily.    Dispense:  180 tablet    Refill:  2  . sitaGLIPtin (JANUVIA) 50 MG tablet    Sig: Take 1 tablet (50 mg total) by mouth daily.    Dispense:  90 tablet    Refill:  1  . spironolactone (ALDACTONE) 25 MG tablet    Sig: Take 1 tablet (25 mg total) by mouth daily.    Dispense:  90 tablet    Refill:  1  . warfarin (COUMADIN) 5 MG tablet    Sig: Take 1 tablet (5 mg total) by mouth daily.    Dispense:  90 tablet    Refill:  1    Patient with advanced congestive heart failure, atrial fibrillation and mitral valve replacement in today for checkup.  He is currently doing well since the [facility] was added to his regimen several weeks ago.  His blood sugars are in the mid 100s primarily.  Is BUN was elevated on his recent blood work.  It was performed last week in preparation for today's visit.  It is noted that he appeared to be a bit dehydrated.  He was advised to discontinue drinking diet sodas and increase his water intake.  His INR is in range so he will continue Coumadin as is.  His A1c was just a bit high.  He is going to try to become more active as his knee is getting better.  He admits to a sweet tooth he is going to work on cutting back on carb consumption in general and sweets in particular. Follow-up: Return in about 1 month (around 11/24/2019).  Claretta Fraise, M.D.

## 2019-10-28 NOTE — Chronic Care Management (AMB) (Signed)
  Chronic Care Management   Outreach Note  10/28/2019 Name: John [name] MRN: [id number] DOB: [birth year]  [name] is a 69 y.o. year old male who is a primary care patient of Stacks, Cletus Gash, MD. I reached out to [place] by phone today in response to a referral sent by Mr. Shontez Sermon [place]'s health plan.     Third unsuccessful telephone outreach was attempted today. The patient was referred to the case management team for assistance with care management and care coordination. The patient's primary care provider has been notified of our unsuccessful attempts to make or maintain contact with the patient. The care management team is pleased to engage with this patient at any time in the future should he/she be interested in assistance from the care management team.   Follow Up Plan: The care management team is available to follow up with the patient after provider conversation with the patient regarding recommendation for care management engagement and subsequent re-referral to the care management team.   Noreene Larsson, [place], [place], [place] [address] Direct Dial: [phone] [email or web address]

## 2019-11-04 ENCOUNTER — Other Ambulatory Visit: Payer: Self-pay | Admitting: Family Medicine

## 2019-11-04 DIAGNOSIS — I255 Ischemic cardiomyopathy: Secondary | ICD-10-CM

## 2019-11-24 ENCOUNTER — Other Ambulatory Visit: Payer: Self-pay

## 2019-11-24 ENCOUNTER — Encounter: Payer: Self-pay | Admitting: Family Medicine

## 2019-11-24 ENCOUNTER — Ambulatory Visit (INDEPENDENT_AMBULATORY_CARE_PROVIDER_SITE_OTHER): Payer: PPO | Admitting: Family Medicine

## 2019-11-24 VITALS — BP 134/68 | HR 74 | Temp 98.0°F | Ht 71.0 in | Wt 218.4 lb

## 2019-11-24 DIAGNOSIS — E119 Type 2 diabetes mellitus without complications: Secondary | ICD-10-CM

## 2019-11-24 DIAGNOSIS — I4811 Longstanding persistent atrial fibrillation: Secondary | ICD-10-CM | POA: Diagnosis not present

## 2019-11-24 DIAGNOSIS — Z9889 Other specified postprocedural states: Secondary | ICD-10-CM | POA: Diagnosis not present

## 2019-11-24 DIAGNOSIS — Z794 Long term (current) use of insulin: Secondary | ICD-10-CM | POA: Diagnosis not present

## 2019-11-24 LAB — COAGUCHEK XS/INR WAIVED
INR: 2.7 — ABNORMAL HIGH (ref 0.9–1.1)
Prothrombin Time: 31.9 s

## 2019-11-24 NOTE — Progress Notes (Signed)
Subjective:  Patient ID: John [name], male    DOB: 05-24-1951  Age: 69 y.o. MRN: [id number]  CC: No chief complaint on file.   HPI John [name] presents for Atrial fibrillation follow up. Pt. is treated with rate control and anticoagulation. Pt.  denies palpitations, rapid rate, chest pain, dyspnea and edema. There has been no bleeding from nose or gums. Pt. has not noticed blood with urine or stool.  Although there is routine bruising easily, it is not excessive.   Depression screen [facility] 2/9 10/27/2019 10/20/2019 08/25/2019  Decreased Interest 0 0 0  Down, Depressed, Hopeless 0 0 0  PHQ - 2 Score 0 0 0  Altered sleeping - - -  Tired, decreased energy - - -  Change in appetite - - -  Feeling bad or failure about yourself  - - -  Trouble concentrating - - -  Moving slowly or fidgety/restless - - -  Suicidal thoughts - - -  PHQ-9 Score - - -  Some recent data might be hidden    History John [name] has a past medical history of Allergy, Anemia, Arthritis, Atrial fibrillation ([place]), Bruises easily, CAD (coronary artery disease), CHF (congestive heart failure) ([place]), Constipation, Enlarged prostate, Flu (09/2013), GERD (gastroesophageal reflux disease), Glaucoma, History of blood transfusion, History of colon polyps, HLD (hyperlipidemia), HTN (hypertension), Insomnia, Kidney stones, Nocturia, Peripheral edema, Peripheral neuropathy, Rheumatic fever (~ 1965), Rheumatic heart disease, S/P MVR (mitral valve replacement) (11/2000), 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   He has a past surgical history that includes Mitral valve replacement (11/2000); laparotomy (N/A, 02/09/2013); Portacath placement (N/A, 02/09/2013); Coronary artery bypass graft (2002); Septoplasty; Application if wound vac (2014); Colonoscopy; Esophagogastroduodenoscopy; Laparoscopic incisional / umbilical / ventral hernia repair (03/15/2014); Port-a-cath removal (03/15/2014);  Hernia repair; Cardiac catheterization (2002); Incisional hernia repair (N/A, 03/15/2014); Port-a-cath removal (Left, 03/15/2014); Insertion of mesh (N/A, 03/15/2014); RIGHT/LEFT HEART CATH AND CORONARY/GRAFT ANGIOGRAPHY (N/A, 05/10/2017); Coronary artery bypass graft; and Small intestine surgery.   His family history includes Arthritis in his brother; Depression in his brother; Diabetes in his brother, brother, and mother; Heart disease in his brother and brother; Hyperlipidemia in his brother and brother; Hypertension in his brother, brother, and father; Thyroid disease in his sister.He reports that he has quit smoking. His smoking use included cigars. He quit after 40.00 years of use. He has never used smokeless tobacco. He reports that he does not drink alcohol or use drugs.    ROS Review of Systems  Constitutional: Negative for fever.  Respiratory: Negative for shortness of breath.   Cardiovascular: Negative for chest pain.  Musculoskeletal: Negative for arthralgias.  Skin: Negative for rash.    Objective:  There were no vitals taken for this visit.  BP Readings from Last 3 Encounters:  10/27/19 133/67  10/20/19 125/61  09/22/19 138/60    Wt Readings from Last 3 Encounters:  10/27/19 218 lb 3.2 oz (99 kg)  10/20/19 213 lb 3.2 oz (96.7 kg)  09/22/19 215 lb 9.6 oz (97.8 kg)     Physical Exam Vitals reviewed.  Constitutional:      Appearance: He is well-developed.  HENT:     Head: Normocephalic and atraumatic.     Right Ear: External ear normal.     Left Ear: External ear normal.     Mouth/Throat:     Pharynx: No oropharyngeal exudate or posterior oropharyngeal erythema.  Eyes:     Pupils: Pupils  are equal, round, and reactive to light.  Cardiovascular:     Rate and Rhythm: Normal rate and regular rhythm.     Heart sounds: No murmur.  Pulmonary:     Effort: No respiratory distress.     Breath sounds: Normal breath sounds.  Musculoskeletal:     Cervical back: Normal range  of motion and neck supple.  Neurological:     Mental Status: He is alert and oriented to person, place, and time.       Assessment & Plan:   Diagnoses and all orders for this visit:  Personal history of surgery to heart and great vessels, presenting hazards to health -     CoaguChek XS/INR Waived  Longstanding persistent atrial fibrillation (HCC) -     CoaguChek XS/INR Waived  Type 2 diabetes mellitus without complication, with long-term current use of insulin (HCC) -     Microalbumin / creatinine urine ratio       I am having John [name] maintain his Osteo Bi-Flex Adv Triple St, aspirin, triamcinolone cream, Viagra, nitroGLYCERIN, OneTouch Verio, OneTouch Delica Lancets 06Y, Dexilant, furosemide, glipiZIDE, insulin glargine, metFORMIN, metoprolol, pravastatin, sacubitril-valsartan, sitaGLIPtin, warfarin, and spironolactone.  Allergies as of 11/24/2019      Reactions   Doxycycline Hives      Medication List       Accurate as of November 24, 2019  8:05 AM. If you have any questions, ask your nurse or doctor.        aspirin 81 MG chewable tablet Chew 81 mg by mouth daily.   Dexilant 60 MG capsule Generic drug: dexlansoprazole Take 1 capsule (60 mg total) by mouth daily.   furosemide 40 MG tablet Commonly known as: LASIX Take 1 tablet (40 mg total) by mouth daily.   glipiZIDE 10 MG 24 hr tablet Commonly known as: GLUCOTROL XL Take 1 tablet (10 mg total) by mouth daily.   insulin glargine 100 UNIT/ML injection Commonly known as: LANTUS Inject 0.85 mLs (85 Units total) into the skin 2 (two) times daily as needed (takes 85 units every night at bedtime 2 nd dose only if needed for elevated blood sugars).   metFORMIN 500 MG tablet Commonly known as: GLUCOPHAGE Take 1 tablet (500 mg total) by mouth 2 (two) times daily with a meal.   metoprolol 200 MG 24 hr tablet Commonly known as: TOPROL-XL Take 1 tablet (200 mg total) by mouth daily after breakfast.     nitroGLYCERIN 0.4 MG SL tablet Commonly known as: NITROSTAT Place 1 tablet (0.4 mg total) under the tongue every 5 (five) minutes as needed for chest pain.   OneTouch Delica Lancets 69S Misc 1 each by Other route 2 (two) times daily. Dx E11.9   OneTouch Verio test strip Generic drug: glucose blood Test BS BID Dx E11.9   Osteo Bi-Flex Adv Triple St Tabs Take 500 each by mouth daily.   pravastatin 80 MG tablet Commonly known as: PRAVACHOL TAKE 1 TABLET BY MOUTH DAILY EVERY EVENING   sacubitril-valsartan 97-103 MG Commonly known as: ENTRESTO Take 1 tablet by mouth 2 (two) times daily.   sitaGLIPtin 50 MG tablet Commonly known as: JANUVIA Take 1 tablet (50 mg total) by mouth daily.   spironolactone 25 MG tablet Commonly known as: ALDACTONE TAKE 1 TABLET BY MOUTH EVERY DAY   triamcinolone cream 0.1 % Commonly known as: KENALOG Apply 1 application topically 2 (two) times daily. Avoid face and genitalia   Viagra 100 MG tablet Generic drug: sildenafil Take  0.5 tablets (50 mg total) by mouth as needed.   warfarin 5 MG tablet Commonly known as: COUMADIN Take as directed by the anticoagulation clinic. If you are unsure how to take this medication, talk to your nurse or doctor. Original instructions: Take 1 tablet (5 mg total) by mouth daily.      Patient states that he is not short of breath.  He is cutting way back on soft drinks.  He is asymptomatic at this time from his CHF and diabetes.  His eye in our is therapeutic today.  He should continue his medications particularly Coumadin as is.  Follow-up: No follow-ups on file.  Claretta Fraise, M.D.

## 2019-11-25 ENCOUNTER — Other Ambulatory Visit: Payer: Self-pay | Admitting: Family Medicine

## 2019-11-25 DIAGNOSIS — E119 Type 2 diabetes mellitus without complications: Secondary | ICD-10-CM

## 2019-11-25 LAB — MICROALBUMIN / CREATININE URINE RATIO
Creatinine, Urine: 99.3 mg/dL
Microalb/Creat Ratio: 65 mg/g creat — ABNORMAL HIGH (ref 0–29)
Microalbumin, Urine: 65 ug/mL

## 2019-12-02 ENCOUNTER — Other Ambulatory Visit: Payer: Self-pay | Admitting: Family Medicine

## 2019-12-02 DIAGNOSIS — E119 Type 2 diabetes mellitus without complications: Secondary | ICD-10-CM

## 2019-12-06 ENCOUNTER — Other Ambulatory Visit: Payer: Self-pay | Admitting: Family Medicine

## 2019-12-06 DIAGNOSIS — Z794 Long term (current) use of insulin: Secondary | ICD-10-CM

## 2019-12-06 DIAGNOSIS — E119 Type 2 diabetes mellitus without complications: Secondary | ICD-10-CM

## 2019-12-11 ENCOUNTER — Other Ambulatory Visit: Payer: Self-pay | Admitting: Family Medicine

## 2019-12-11 DIAGNOSIS — Z794 Long term (current) use of insulin: Secondary | ICD-10-CM

## 2019-12-11 DIAGNOSIS — E119 Type 2 diabetes mellitus without complications: Secondary | ICD-10-CM

## 2019-12-23 ENCOUNTER — Other Ambulatory Visit: Payer: Self-pay

## 2019-12-23 ENCOUNTER — Ambulatory Visit (HOSPITAL_COMMUNITY): Payer: PPO | Attending: Cardiovascular Disease

## 2019-12-23 DIAGNOSIS — I255 Ischemic cardiomyopathy: Secondary | ICD-10-CM

## 2019-12-23 DIAGNOSIS — I5022 Chronic systolic (congestive) heart failure: Secondary | ICD-10-CM

## 2019-12-28 ENCOUNTER — Other Ambulatory Visit: Payer: Self-pay | Admitting: *Deleted

## 2019-12-28 ENCOUNTER — Telehealth: Payer: Self-pay | Admitting: Cardiology

## 2019-12-28 DIAGNOSIS — I255 Ischemic cardiomyopathy: Secondary | ICD-10-CM

## 2019-12-28 NOTE — Telephone Encounter (Signed)
° °  Pt is returning call from [facility] regarding his echo results  Please call

## 2019-12-28 NOTE — Telephone Encounter (Signed)
Reviewed echo results with patient who is aware he has been referred to EP to discuss possible ICD placement.

## 2020-01-05 ENCOUNTER — Other Ambulatory Visit: Payer: Self-pay

## 2020-01-05 ENCOUNTER — Other Ambulatory Visit: Payer: PPO

## 2020-01-06 ENCOUNTER — Ambulatory Visit (INDEPENDENT_AMBULATORY_CARE_PROVIDER_SITE_OTHER): Payer: PPO | Admitting: Family Medicine

## 2020-01-06 ENCOUNTER — Encounter: Payer: Self-pay | Admitting: Family Medicine

## 2020-01-06 VITALS — BP 121/70 | HR 74 | Temp 97.0°F | Resp 20 | Ht 71.0 in | Wt 215.1 lb

## 2020-01-06 DIAGNOSIS — I209 Angina pectoris, unspecified: Secondary | ICD-10-CM | POA: Diagnosis not present

## 2020-01-06 DIAGNOSIS — Z794 Long term (current) use of insulin: Secondary | ICD-10-CM | POA: Diagnosis not present

## 2020-01-06 DIAGNOSIS — I4811 Longstanding persistent atrial fibrillation: Secondary | ICD-10-CM

## 2020-01-06 DIAGNOSIS — E119 Type 2 diabetes mellitus without complications: Secondary | ICD-10-CM

## 2020-01-06 DIAGNOSIS — I25118 Atherosclerotic heart disease of native coronary artery with other forms of angina pectoris: Secondary | ICD-10-CM | POA: Diagnosis not present

## 2020-01-06 DIAGNOSIS — Z9889 Other specified postprocedural states: Secondary | ICD-10-CM | POA: Diagnosis not present

## 2020-01-06 DIAGNOSIS — Z5181 Encounter for therapeutic drug level monitoring: Secondary | ICD-10-CM

## 2020-01-06 DIAGNOSIS — E785 Hyperlipidemia, unspecified: Secondary | ICD-10-CM | POA: Diagnosis not present

## 2020-01-06 DIAGNOSIS — I10 Essential (primary) hypertension: Secondary | ICD-10-CM

## 2020-01-06 LAB — COAGUCHEK XS/INR WAIVED
INR: 3.5 — ABNORMAL HIGH (ref 0.9–1.1)
Prothrombin Time: 41.4 s

## 2020-01-06 NOTE — Progress Notes (Signed)
Subjective:  Patient ID: John [name], male    DOB: March 27, 1951  Age: 69 y.o. MRN: [id number]  CC: No chief complaint on file.   HPI Ashur L Guida presents for Atrial fibrillation follow up. HE has also had mitral valve replacement.  Pt. is treated with rate control and anticoagulation. Pt.  denies palpitations, rapid rate, chest pain, dyspnea and edema. There has been no bleeding from nose or gums. Pt. has not noticed blood with urine or stool.  Although there is routine bruising easily, it is not excessive.   presents for  follow-up of hypertension. Patient has no history of headache chest pain or shortness of breath or recent cough. Patient also denies symptoms of TIA such as focal numbness or weakness. Patient denies side effects from medication. States taking it regularly.   in for follow-up of elevated cholesterol. Doing well without complaints on current medication. Denies side effects of statin including myalgia and arthralgia and nausea. Currently no chest pain, shortness of breath or other cardiovascular related symptoms noted.   Depression screen [facility] 2/9 01/06/2020 11/24/2019 10/27/2019  Decreased Interest 0 0 0  Down, Depressed, Hopeless 0 0 0  PHQ - 2 Score 0 0 0  Altered sleeping - - -  Tired, decreased energy - - -  Change in appetite - - -  Feeling bad or failure about yourself  - - -  Trouble concentrating - - -  Moving slowly or fidgety/restless - - -  Suicidal thoughts - - -  PHQ-9 Score - - -  Some recent data might be hidden    History Hugh has a past medical history of Allergy, Anemia, Arthritis, Atrial fibrillation ([place]), Bruises easily, CAD (coronary artery disease), CHF (congestive heart failure) ([place]), Constipation, Enlarged prostate, Flu (09/2013), GERD (gastroesophageal reflux disease), Glaucoma, History of blood transfusion, History of colon polyps, HLD (hyperlipidemia), HTN (hypertension), Insomnia, Kidney stones, Nocturia, Peripheral edema,  Peripheral neuropathy, Rheumatic fever (~ 1965), Rheumatic heart disease, S/P MVR (mitral valve replacement) (11/2000), 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   He has a past surgical history that includes Mitral valve replacement (11/2000); laparotomy (N/A, 02/09/2013); Portacath placement (N/A, 02/09/2013); Coronary artery bypass graft (2002); Septoplasty; Application if wound vac (2014); Colonoscopy; Esophagogastroduodenoscopy; Laparoscopic incisional / umbilical / ventral hernia repair (03/15/2014); Port-a-cath removal (03/15/2014); Hernia repair; Cardiac catheterization (2002); Incisional hernia repair (N/A, 03/15/2014); Port-a-cath removal (Left, 03/15/2014); Insertion of mesh (N/A, 03/15/2014); RIGHT/LEFT HEART CATH AND CORONARY/GRAFT ANGIOGRAPHY (N/A, 05/10/2017); Coronary artery bypass graft; and Small intestine surgery.   His family history includes Arthritis in his brother; Depression in his brother; Diabetes in his brother, brother, and mother; Heart disease in his brother and brother; Hyperlipidemia in his brother and brother; Hypertension in his brother, brother, and father; Thyroid disease in his sister.He reports that he has quit smoking. His smoking use included cigars. He quit after 40.00 years of use. He has never used smokeless tobacco. He reports that he does not drink alcohol or use drugs.    ROS Review of Systems  Constitutional: Negative for fever.  Respiratory: Negative for shortness of breath.   Cardiovascular: Negative for chest pain.  Musculoskeletal: Negative for arthralgias.  Skin: Negative for rash.    Objective:  BP 121/70   Pulse 74   Temp (!) 97 F (36.1 C) (Temporal)   Resp 20   Ht 5\' 11"  (1.803 m)   Wt 215 lb 2 oz (97.6 kg)  SpO2 99%   BMI 30.00 kg/m   BP Readings from Last 3 Encounters:  01/06/20 121/70  11/24/19 134/68  10/27/19 133/67    Wt Readings from Last 3 Encounters:  01/06/20 215 lb 2 oz (97.6  kg)  11/24/19 218 lb 6.4 oz (99.1 kg)  10/27/19 218 lb 3.2 oz (99 kg)     Physical Exam Vitals reviewed.  Constitutional:      Appearance: He is well-developed.  HENT:     Head: Normocephalic and atraumatic.     Right Ear: External ear normal.     Left Ear: External ear normal.     Mouth/Throat:     Pharynx: No oropharyngeal exudate or posterior oropharyngeal erythema.  Eyes:     Pupils: Pupils are equal, round, and reactive to light.  Cardiovascular:     Rate and Rhythm: Normal rate and regular rhythm.     Heart sounds: No murmur.  Pulmonary:     Effort: No respiratory distress.     Breath sounds: Normal breath sounds.  Musculoskeletal:     Cervical back: Normal range of motion and neck supple.  Neurological:     Mental Status: He is alert and oriented to person, place, and time.      Results for orders placed or performed in visit on 01/06/20  CoaguChek XS/INR Waived  Result Value Ref Range   INR 3.5 (H) 0.9 - 1.1   Prothrombin Time 41.4 sec    Assessment & Plan:   Diagnoses and all orders for this visit:  Longstanding persistent atrial fibrillation ([place]) -     Protime-INR -     CoaguChek XS/INR Waived  Hyperlipidemia, unspecified hyperlipidemia type  Essential hypertension  Angina pectoris (HCC)  MITRAL VALVE REPLACEMENT, HX OF  Coronary artery disease of native artery of native heart with stable angina pectoris ([place])  Encounter for therapeutic drug monitoring  Type 2 diabetes mellitus without complication, with long-term current use of insulin (HCC)    Continue coumadin as is.    I am having Bernhardt L. Veloso maintain his Osteo Bi-Flex Adv Triple St, aspirin, triamcinolone cream, Viagra, nitroGLYCERIN, OneTouch Delica Lancets 09G, Dexilant, furosemide, insulin glargine, metoprolol, pravastatin, sacubitril-valsartan, warfarin, spironolactone, Januvia, glipiZIDE, OneTouch Verio, and metFORMIN.  Allergies as of 01/06/2020      Reactions    Doxycycline Hives      Medication List       Accurate as of January 06, 2020 11:59 PM. If you have any questions, ask your nurse or doctor.        aspirin 81 MG chewable tablet Chew 81 mg by mouth daily.   Dexilant 60 MG capsule Generic drug: dexlansoprazole Take 1 capsule (60 mg total) by mouth daily.   furosemide 40 MG tablet Commonly known as: LASIX Take 1 tablet (40 mg total) by mouth daily.   glipiZIDE 10 MG 24 hr tablet Commonly known as: GLUCOTROL XL TAKE 1 TABLET BY MOUTH EVERY DAY   insulin glargine 100 UNIT/ML injection Commonly known as: LANTUS Inject 0.85 mLs (85 Units total) into the skin 2 (two) times daily as needed (takes 85 units every night at bedtime 2 nd dose only if needed for elevated blood sugars).   Januvia 50 MG tablet Generic drug: sitaGLIPtin TAKE 1 TABLET BY MOUTH EVERY DAY   metFORMIN 500 MG tablet Commonly known as: GLUCOPHAGE TAKE 1 TABLET (500 MG TOTAL) BY MOUTH 2 (TWO) TIMES DAILY WITH A MEAL.   metoprolol 200 MG 24 hr tablet Commonly known  as: TOPROL-XL Take 1 tablet (200 mg total) by mouth daily after breakfast.   nitroGLYCERIN 0.4 MG SL tablet Commonly known as: NITROSTAT Place 1 tablet (0.4 mg total) under the tongue every 5 (five) minutes as needed for chest pain.   OneTouch Delica Lancets 35W Misc 1 each by Other route 2 (two) times daily. Dx E11.9   OneTouch Verio test strip Generic drug: glucose blood USE TO TEST TWICE A DAY   Osteo Bi-Flex Adv Triple St Tabs Take 500 each by mouth daily.   pravastatin 80 MG tablet Commonly known as: PRAVACHOL TAKE 1 TABLET BY MOUTH DAILY EVERY EVENING   sacubitril-valsartan 97-103 MG Commonly known as: ENTRESTO Take 1 tablet by mouth 2 (two) times daily.   spironolactone 25 MG tablet Commonly known as: ALDACTONE TAKE 1 TABLET BY MOUTH EVERY DAY   triamcinolone cream 0.1 % Commonly known as: KENALOG Apply 1 application topically 2 (two) times daily. Avoid face and genitalia    Viagra 100 MG tablet Generic drug: sildenafil Take 0.5 tablets (50 mg total) by mouth as needed.   warfarin 5 MG tablet Commonly known as: COUMADIN Take as directed by the anticoagulation clinic. If you are unsure how to take this medication, talk to your nurse or doctor. Original instructions: Take 1 tablet (5 mg total) by mouth daily.        Follow-up: No follow-ups on file.  Claretta Fraise, M.D.

## 2020-01-09 ENCOUNTER — Encounter: Payer: Self-pay | Admitting: Family Medicine

## 2020-01-15 ENCOUNTER — Telehealth: Payer: Self-pay

## 2020-01-15 ENCOUNTER — Encounter: Payer: Self-pay | Admitting: Internal Medicine

## 2020-01-15 ENCOUNTER — Other Ambulatory Visit: Payer: Self-pay

## 2020-01-15 ENCOUNTER — Telehealth: Payer: Self-pay | Admitting: Family Medicine

## 2020-01-15 ENCOUNTER — Ambulatory Visit (INDEPENDENT_AMBULATORY_CARE_PROVIDER_SITE_OTHER): Payer: PPO | Admitting: Internal Medicine

## 2020-01-15 VITALS — BP 134/66 | HR 77 | Ht 71.0 in | Wt 215.6 lb

## 2020-01-15 DIAGNOSIS — I255 Ischemic cardiomyopathy: Secondary | ICD-10-CM | POA: Diagnosis not present

## 2020-01-15 NOTE — Patient Instructions (Addendum)
Medication Instructions:  Your physician recommends that you continue on your current medications as directed. Please refer to the Current Medication list given to you today.  Labwork: None ordered.  Testing/Procedures: None ordered.  Follow-Up:  SEE INSTRUCTION LETTER  Any Other Special Instructions Will Be Listed Below (If Applicable).  If you need a refill on your cardiac medications before your next appointment, please call your pharmacy.    Cardioverter Defibrillator Implantation  An implantable cardioverter defibrillator (ICD) is a small device that is placed under the skin in the chest or abdomen. An ICD consists of a battery, a small computer (pulse generator), and wires (leads) that go into the heart. An ICD is used to detect and correct two types of dangerous irregular heartbeats (arrhythmias):  A rapid heart rhythm (tachycardia).  An arrhythmia in which the lower chambers of the heart (ventricles) contract in an uncoordinated way (fibrillation). When an ICD detects tachycardia, it sends a low-energy shock to the heart to restore the heartbeat to normal (cardioversion). This signal is usually painless. If cardioversion does not work or if the ICD detects fibrillation, it delivers a high-energy shock to the heart (defibrillation) to restart the heart. This shock may feel like a strong jolt in the chest. Your health care provider may prescribe an ICD if:  You have had an arrhythmia that originated in the ventricles.  Your heart has been damaged by a disease or heart condition. Sometimes, ICDs are programmed to act as a device called a pacemaker. Pacemakers can be used to treat a slow heartbeat (bradycardia) or tachycardia by taking over the heart rate with electrical impulses. Tell a health care provider about:  Any allergies you have.  All medicines you are taking, including vitamins, herbs, eye drops, creams, and over-the-counter medicines.  Any problems you or family  members have had with anesthetic medicines.  Any blood disorders you have.  Any surgeries you have had.  Any medical conditions you have.  Whether you are pregnant or may be pregnant. What are the risks? Generally, this is a safe procedure. However, problems may occur, including:  Swelling, bleeding, or bruising.  Infection.  Blood clots.  Damage to other structures or organs, such as nerves, blood vessels, or the heart.  Allergic reactions to medicines used during the procedure. What happens before the procedure? Staying hydrated Follow instructions from your health care provider about hydration, which may include:  Up to 2 hours before the procedure - you may continue to drink clear liquids, such as water, clear fruit juice, black coffee, and plain tea. Eating and drinking restrictions Follow instructions from your health care provider about eating and drinking, which may include:  8 hours before the procedure - stop eating heavy meals or foods such as meat, fried foods, or fatty foods.  6 hours before the procedure - stop eating light meals or foods, such as toast or cereal.  6 hours before the procedure - stop drinking milk or drinks that contain milk.  2 hours before the procedure - stop drinking clear liquids. Medicine Ask your health care provider about:  Changing or stopping your normal medicines. This is important if you take diabetes medicines or blood thinners.  Taking medicines such as aspirin and ibuprofen. These medicines can thin your blood. Do not take these medicines before your procedure if your doctor tells you not to. Tests  You may have blood tests.  You may have a test to check the electrical signals in your heart (electrocardiogram, ECG).    You may have imaging tests, such as a chest X-ray. General instructions  For 24 hours before the procedure, stop using products that contain nicotine or tobacco, such as cigarettes and e-cigarettes. If you  need help quitting, ask your health care provider.  Plan to have someone take you home from the hospital or clinic.  You may be asked to shower with a germ-killing soap. What happens during the procedure?  To reduce your risk of infection: ? Your health care team will wash or sanitize their hands. ? Your skin will be washed with soap. ? Hair may be removed from the surgical area.  Small monitors will be put on your body. They will be used to check your heart, blood pressure, and oxygen level.  An IV tube will be inserted into one of your veins.  You will be given one or more of the following: ? A medicine to help you relax (sedative). ? A medicine to numb the area (local anesthetic). ? A medicine to make you fall asleep (general anesthetic).  Leads will be guided through a blood vessel into your heart and attached to your heart muscles. Depending on the ICD, the leads may go into one ventricle or they may go into both ventricles and into an upper chamber of the heart. An X-ray machine (fluoroscope) will be usedto help guide the leads.  A small incision will be made to create a deep pocket under your skin.  The pulse generator will be placed into the pocket.  The ICD will be tested.  The incision will be closed with stitches (sutures), skin glue, or staples.  A bandage (dressing) will be placed over the incision. This procedure may vary among health care providers and hospitals. What happens after the procedure?  Your blood pressure, heart rate, breathing rate, and blood oxygen level will be monitored often until the medicines you were given have worn off.  A chest X-ray will be taken to check that the ICD is in the right place.  You will need to stay in the hospital for 1-2 days so your health care provider can make sure your ICD is working.  Do not drive for 24 hours if you received a sedative. Ask your health care provider when it is safe for you to drive.  You may be  given an identification card explaining that you have an ICD. Summary  An implantable cardioverter defibrillator (ICD) is a small device that is placed under the skin in the chest or abdomen. It is used to detect and correct dangerous irregular heartbeats (arrhythmias).  An ICD consists of a battery, a small computer (pulse generator), and wires (leads) that go into the heart.  When an ICD detects rapid heart rhythm (tachycardia), it sends a low-energy shock to the heart to restore the heartbeat to normal (cardioversion). If cardioversion does not work or if the ICD detects uncoordinated heart contractions (fibrillation), it delivers a high-energy shock to the heart (defibrillation) to restart the heart.  You will need to stay in the hospital for 1-2 days to make sure your ICD is working. This information is not intended to replace advice given to you by your health care provider. Make sure you discuss any questions you have with your health care provider. Document Revised: 08/09/2017 Document Reviewed: 09/05/2016 Elsevier Patient Education  2020 [employer]  

## 2020-01-15 NOTE — Telephone Encounter (Signed)
Notes - labs requested was noted in the OV appt note

## 2020-01-15 NOTE — Telephone Encounter (Signed)
Lorre Nick called from [facility] for Dr Lovena Le, requesting that when patient comes in to see Dr Livia Snellen on 02/11/20, he have CBC with diff, BMP, and INR performed.

## 2020-01-15 NOTE — Progress Notes (Signed)
HPI [name] is referred today by Dr. [facility] to consider insertion of an ICD. He is a pleasant 69 yo man with a h/o rheumatic heart disease, s/p MVR over 20 years ago. He has chronic atrial fib. He has a h/o LV dysfunction which has worsened despite maximal medical therapy under the direction of Dr. Lenore Cordia. He also has CAD and is s/p CABG. He has a known occlusion of an SVG to the RCA. However there was a patent native RCA. He has been on chronic coumadin therapy. His valve function is ok. He has scar inferiorly. 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 He had a port a cath on the left. He is left handed. He denies syncope, chest pain and he has class 2 dyspnea.  Allergies  Allergen Reactions  . Doxycycline Hives     Current Outpatient Medications  Medication Sig Dispense Refill  . aspirin 81 MG chewable tablet Chew 81 mg by mouth daily.    Marland Kitchen CIALIS 10 MG tablet Take 10 mg by mouth as needed for erectile dysfunction.    Marland Kitchen dexlansoprazole (DEXILANT) 60 MG capsule Take 1 capsule (60 mg total) by mouth daily. 90 capsule 1  . furosemide (LASIX) 40 MG tablet Take 1 tablet (40 mg total) by mouth daily. 90 tablet 1  . glipiZIDE (GLUCOTROL XL) 10 MG 24 hr tablet TAKE 1 TABLET BY MOUTH EVERY DAY 90 tablet 0  . glucose blood (ONETOUCH VERIO) test strip USE TO TEST TWICE A DAY 200 strip 3  . insulin glargine (LANTUS) 100 UNIT/ML injection Inject 0.85 mLs (85 Units total) into the skin 2 (two) times daily as needed (takes 85 units every night at bedtime 2 nd dose only if needed for elevated blood sugars). 10 mL 5  . JANUVIA 50 MG tablet TAKE 1 TABLET BY MOUTH EVERY DAY 90 tablet 0  . metFORMIN (GLUCOPHAGE) 500 MG tablet TAKE 1 TABLET (500 MG TOTAL) BY MOUTH 2 (TWO) TIMES DAILY WITH A MEAL. 180 tablet 1  . metoprolol (TOPROL-XL) 200 MG 24 hr tablet Take 1 tablet (200 mg total) by mouth daily after breakfast. 90 tablet 3  . Misc Natural Products (OSTEO BI-FLEX ADV  TRIPLE ST) TABS Take 500 each by mouth daily.     . nitroGLYCERIN (NITROSTAT) 0.4 MG SL tablet Place 1 tablet (0.4 mg total) under the tongue every 5 (five) minutes as needed for chest pain. 50 tablet 10  . OneTouch Delica Lancets 06T MISC 1 each by Other route 2 (two) times daily. Dx E11.9 200 each 3  . pravastatin (PRAVACHOL) 80 MG tablet TAKE 1 TABLET BY MOUTH DAILY EVERY EVENING 90 tablet 3  . sacubitril-valsartan (ENTRESTO) 97-103 MG Take 1 tablet by mouth 2 (two) times daily. 180 tablet 2  . spironolactone (ALDACTONE) 25 MG tablet TAKE 1 TABLET BY MOUTH EVERY DAY 90 tablet 0  . triamcinolone cream (KENALOG) 0.1 % Apply 1 application topically 2 (two) times daily. Avoid face and genitalia 45 g 2  . VIAGRA 100 MG tablet Take 0.5 tablets (50 mg total) by mouth as needed. 10 tablet 3  . warfarin (COUMADIN) 5 MG tablet Take 1 tablet (5 mg total) by mouth daily. 90 tablet 1   No current facility-administered medications for this visit.     Past Medical History:  Diagnosis Date  . Allergy   . Anemia    "lost blood w/the cancer"  . Arthritis  back   . Atrial fibrillation ([place])    takes Coumadin daily  . Bruises easily    takes Coumadin daily  . CAD (coronary artery disease)    a. s/p CABG 11/2000 (L-LAD, S-RCA);  b. Lexiscan Myoview 11/12: EF 45%, ischemia and possibly some scar at the base of the inferolateral wall;   c. Lex MV 11/13:  EF 44%, Inf and IL scar/soft tissue atten, small mild amt of inf ischemia,  . CHF (congestive heart failure) ([place])   . Constipation    takes Colace daily  . Enlarged prostate   . Flu 09/2013  . GERD (gastroesophageal reflux disease)    takes Nexium daily  . Glaucoma   . History of blood transfusion    "related to cancer"  . History of colon polyps   . HLD (hyperlipidemia)    takes Vytorin daily  . HTN (hypertension)    takes Amlodipine,Metoprolol, and Ramipril daily  . Insomnia    states he has always been like this.But doesn't take any meds    . Kidney stones    "I passed them all"  . Nocturia   . Peripheral edema    takes Furosemide daily  . Peripheral neuropathy   . Rheumatic fever ~ 1965  . Rheumatic heart disease    a. s/p mechanical (St. Jude) MVR 2002;  b. Echo 5/11: Mild LVH, EF 45-50%, mild AS, mild AI, mean gradient 11 mmHg, MVR with normal gradients, moderate LAE, mild RAE;  c.  Echo 11/13:   mod LVH, EF 55-60%, mild AS (mean 18 mmHg), mild AI, severe LAE, mild RAE, PASP 41  . 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 Small bowel cancer ([place]) 2014  . SOB (shortness of breath)    with exertion  . Type II diabetes mellitus ([place])    takes Januvia,Glipizide,and Metformin daily as well as Lantus    ROS:   All systems reviewed and negative except as noted in the HPI.   Past Surgical History:  Procedure Laterality Date  . APPLICATION OF WOUND VAC  2014  . CARDIAC CATHETERIZATION  2002  . COLONOSCOPY    . CORONARY ARTERY BYPASS GRAFT  2002   x 3  . CORONARY ARTERY BYPASS GRAFT    . ESOPHAGOGASTRODUODENOSCOPY    . HERNIA REPAIR    . INCISIONAL HERNIA REPAIR N/A 03/15/2014   Procedure: LAPAROSCOPIC INCISIONAL HERNIA;  Surgeon: Harl Bowie, MD;  Location: [facility];  Service: General;  Laterality: N/A;  . INSERTION OF MESH N/A 03/15/2014   Procedure: INSERTION OF MESH;  Surgeon: Harl Bowie, MD;  Location: [place] [facility];  Service: General;  Laterality: N/A;  . LAPAROSCOPIC INCISIONAL / UMBILICAL / [place]  03/15/2014   IHR  . LAPAROTOMY N/A 02/09/2013   Procedure: EXPLORATORY LAPAROTOMY WITH incisional biopsy intra- ABDOMINAL MASS ILOCECTOMY ;  Surgeon: Harl Bowie, MD;  Location: [facility];  Service: General;  Laterality: N/A;  . MITRAL VALVE REPLACEMENT  11/2000   #33 St. Jude mechanical mitral valve prosthesis. Dr. Servando Snare  . PORT-A-CATH REMOVAL  03/15/2014  . PORT-A-CATH REMOVAL Left 03/15/2014   Procedure: REMOVAL PORT-A-CATH;  Surgeon: Harl Bowie, MD;  Location: [place];  Service:  General;  Laterality: Left;  . PORTACATH PLACEMENT N/A 02/09/2013   Procedure: INSERTION PORT-A-CATH;  Surgeon: Harl Bowie, MD;  Location: [facility];  Service: General;  Laterality: N/A;  . RIGHT/LEFT HEART CATH AND CORONARY/GRAFT ANGIOGRAPHY N/A 05/10/2017   Procedure: RIGHT/LEFT HEART  CATH AND CORONARY/GRAFT ANGIOGRAPHY;  Surgeon: Martinique, Peter M, MD;  Location: [facility] [facility];  Service: Cardiovascular;  Laterality: N/A;  . SEPTOPLASTY    . SMALL INTESTINE SURGERY       Family History  Problem Relation Age of Onset  . Diabetes Mother   . Hypertension Father   . Thyroid disease Sister   . Diabetes Brother   . Arthritis Brother   . Depression Brother   . Heart disease Brother   . Hyperlipidemia Brother   . Hypertension Brother   . Hypertension Brother   . Diabetes Brother   . Heart disease Brother   . Hyperlipidemia Brother      Social History   Socioeconomic History  . Marital status: Married    Spouse name: Marcie Bal  . Number of children: 1  . Years of education: 76  . Highest education level: High school graduate  Occupational History  . Occupation: disabled  Tobacco Use  . Smoking status: Former Smoker    Years: 40.00    Types: Cigars  . Smokeless tobacco: Never Used  Substance and Sexual Activity  . Alcohol use: No    Comment: "drank for 30 years; quit ~ 2000"  . Drug use: No    Types: Marijuana    Comment: quit in 2000 'reefer'  . Sexual activity: Yes  Other Topics Concern  . 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   Social Determinants of Health   Financial Resource Strain: Low Risk   . Difficulty of Paying Living Expenses: Not hard at all  Food Insecurity: No Food Insecurity  . Worried About Charity fundraiser in the Last Year: Never true  . Ran Out of Food in the Last Year: Never true  Transportation Needs: No Transportation Needs  . Lack of Transportation (Medical): No  . Lack of Transportation  (Non-Medical): No  Physical Activity: Sufficiently Active  . Days of Exercise per Week: 3 days  . Minutes of Exercise per Session: 120 min  Stress: No Stress Concern Present  . Feeling of Stress : Not at all  Social Connections: Somewhat Isolated  . Frequency of Communication with Friends and Family: More than three times a week  . Frequency of Social Gatherings with Friends and Family: More than three times a week  . Attends Religious Services: Never  . Active Member of Clubs or Organizations: No  . Attends Archivist Meetings: Never  . Marital Status: Married  Human resources officer Violence: Not At Risk  . Fear of Current or Ex-Partner: No  . Emotionally Abused: No  . Physically Abused: No  . Sexually Abused: No     BP 134/66   Pulse 77   Ht 5\' 11"  (1.803 m)   Wt 215 lb 9.6 oz (97.8 kg)   SpO2 99%   BMI 30.07 kg/m   Physical Exam:  Well appearing NAD HEENT: Unremarkable Neck:  No JVD, no thyromegally Lymphatics:  No adenopathy Back:  No CVA tenderness Lungs:  Clear with no wheezes HEART:  Regular rate rhythm, no murmurs, no rubs, no clicks Abd:  soft, positive bowel sounds, no organomegally, no rebound, no guarding Ext:  2 plus pulses, no edema, no cyanosis, no clubbing Skin:  No rashes no nodules Neuro:  CN II through XII intact, motor grossly intact  EKG - atrial fib with RBBB   Assess/Plan: 1. Chronic systolic heart failure - his most recent EF is 25%  which is worse than the echo 2 years ago which was 30%. He has been on optimal medical therapy. I have discussed the indications, risks, benefits, goals, expectations of ICD insertion with the patient and he wishes to proceed. 2. ICM - he is s/p MI and has no angina. He will continue his current medical therapy. 3. Chronic atrial fib - his rate appears to be well controlled. He denies palpitations. Once his device is in place we will be able to monitor his rate control.  4. MVR - he has a recent echo  demonstrating normal mechanical valve function with a mean gradient of 4 and no MR. 5. Coags - with regard to ICD insertion, I would want him to hold his coumadin for 2 days, to get the INR in the under 3 range, and then restart coumadin after surgery. No lovenox.   Mikle Bosworth.D.

## 2020-01-15 NOTE — Telephone Encounter (Signed)
Called Dr. Livia Snellen office and requested pt get CBC-DIFF and BMP drawn while at his office. Also informed receptionist of upcoming ICD and requested an INR be done as well. Receptionist verbally agreed and put a note in for Dr. Livia Snellen.

## 2020-01-31 ENCOUNTER — Other Ambulatory Visit: Payer: Self-pay | Admitting: Family Medicine

## 2020-01-31 DIAGNOSIS — I255 Ischemic cardiomyopathy: Secondary | ICD-10-CM

## 2020-02-09 ENCOUNTER — Other Ambulatory Visit: Payer: PPO

## 2020-02-09 ENCOUNTER — Other Ambulatory Visit: Payer: Self-pay

## 2020-02-09 DIAGNOSIS — Z794 Long term (current) use of insulin: Secondary | ICD-10-CM

## 2020-02-09 DIAGNOSIS — Z9889 Other specified postprocedural states: Secondary | ICD-10-CM

## 2020-02-09 DIAGNOSIS — E119 Type 2 diabetes mellitus without complications: Secondary | ICD-10-CM | POA: Diagnosis not present

## 2020-02-09 DIAGNOSIS — Z7901 Long term (current) use of anticoagulants: Secondary | ICD-10-CM | POA: Diagnosis not present

## 2020-02-09 DIAGNOSIS — I4811 Longstanding persistent atrial fibrillation: Secondary | ICD-10-CM | POA: Diagnosis not present

## 2020-02-09 LAB — BAYER DCA HB A1C WAIVED: HB A1C (BAYER DCA - WAIVED): 7.2 % — ABNORMAL HIGH (ref <7.0)

## 2020-02-10 ENCOUNTER — Other Ambulatory Visit: Payer: PPO

## 2020-02-10 DIAGNOSIS — Z7901 Long term (current) use of anticoagulants: Secondary | ICD-10-CM

## 2020-02-10 DIAGNOSIS — E119 Type 2 diabetes mellitus without complications: Secondary | ICD-10-CM | POA: Diagnosis not present

## 2020-02-10 DIAGNOSIS — I4811 Longstanding persistent atrial fibrillation: Secondary | ICD-10-CM

## 2020-02-10 DIAGNOSIS — Z794 Long term (current) use of insulin: Secondary | ICD-10-CM | POA: Diagnosis not present

## 2020-02-10 DIAGNOSIS — Z9889 Other specified postprocedural states: Secondary | ICD-10-CM

## 2020-02-10 LAB — LIPID PANEL
Chol/HDL Ratio: 5.5 ratio — ABNORMAL HIGH (ref 0.0–5.0)
Cholesterol, Total: 153 mg/dL (ref 100–199)
HDL: 28 mg/dL — ABNORMAL LOW (ref >39)
LDL Chol Calc (NIH): 96 mg/dL (ref 0–99)
Triglycerides: 166 mg/dL — ABNORMAL HIGH (ref 0–149)
VLDL Cholesterol Cal: 29 mg/dL (ref 5–40)

## 2020-02-10 LAB — CBC WITH DIFFERENTIAL/PLATELET
Basophils Absolute: 0.1 10*3/uL (ref 0.0–0.2)
Basos: 1 %
EOS (ABSOLUTE): 0.2 10*3/uL (ref 0.0–0.4)
Eos: 3 %
Hematocrit: 44.1 % (ref 37.5–51.0)
Hemoglobin: 14.2 g/dL (ref 13.0–17.7)
Immature Grans (Abs): 0 10*3/uL (ref 0.0–0.1)
Immature Granulocytes: 0 %
Lymphocytes Absolute: 1 10*3/uL (ref 0.7–3.1)
Lymphs: 12 %
MCH: 29.8 pg (ref 26.6–33.0)
MCHC: 32.2 g/dL (ref 31.5–35.7)
MCV: 93 fL (ref 79–97)
Monocytes Absolute: 0.7 10*3/uL (ref 0.1–0.9)
Monocytes: 8 %
Neutrophils Absolute: 6.5 10*3/uL (ref 1.4–7.0)
Neutrophils: 76 %
Platelets: 185 10*3/uL (ref 150–450)
RBC: 4.77 x10E6/uL (ref 4.14–5.80)
RDW: 14.7 % (ref 11.6–15.4)
WBC: 8.5 10*3/uL (ref 3.4–10.8)

## 2020-02-10 LAB — CMP14+EGFR
ALT: 16 IU/L (ref 0–44)
AST: 25 IU/L (ref 0–40)
Albumin/Globulin Ratio: 1.7 (ref 1.2–2.2)
Albumin: 4.3 g/dL (ref 3.8–4.8)
Alkaline Phosphatase: 111 IU/L (ref 48–121)
BUN/Creatinine Ratio: 43 — ABNORMAL HIGH (ref 10–24)
BUN: 65 mg/dL — ABNORMAL HIGH (ref 8–27)
Bilirubin Total: 0.4 mg/dL (ref 0.0–1.2)
CO2: 22 mmol/L (ref 20–29)
Calcium: 9.3 mg/dL (ref 8.6–10.2)
Chloride: 107 mmol/L — ABNORMAL HIGH (ref 96–106)
Creatinine, Ser: 1.5 mg/dL — ABNORMAL HIGH (ref 0.76–1.27)
GFR calc Af Amer: 55 mL/min/{1.73_m2} — ABNORMAL LOW (ref >59)
GFR calc non Af Amer: 48 mL/min/{1.73_m2} — ABNORMAL LOW (ref >59)
Globulin, Total: 2.6 g/dL (ref 1.5–4.5)
Glucose: 94 mg/dL (ref 65–99)
Potassium: 5.9 mmol/L (ref 3.5–5.2)
Sodium: 142 mmol/L (ref 134–144)
Total Protein: 6.9 g/dL (ref 6.0–8.5)

## 2020-02-10 LAB — HM DIABETES EYE EXAM

## 2020-02-10 NOTE — Progress Notes (Signed)
Left message to please call our office. 

## 2020-02-11 ENCOUNTER — Other Ambulatory Visit: Payer: Self-pay

## 2020-02-11 ENCOUNTER — Ambulatory Visit (INDEPENDENT_AMBULATORY_CARE_PROVIDER_SITE_OTHER): Payer: PPO | Admitting: Family Medicine

## 2020-02-11 ENCOUNTER — Encounter: Payer: Self-pay | Admitting: Family Medicine

## 2020-02-11 ENCOUNTER — Telehealth: Payer: Self-pay

## 2020-02-11 VITALS — BP 136/63 | HR 83 | Temp 97.9°F | Ht 71.0 in | Wt 214.4 lb

## 2020-02-11 DIAGNOSIS — Z5181 Encounter for therapeutic drug level monitoring: Secondary | ICD-10-CM | POA: Diagnosis not present

## 2020-02-11 DIAGNOSIS — I255 Ischemic cardiomyopathy: Secondary | ICD-10-CM

## 2020-02-11 DIAGNOSIS — Z9889 Other specified postprocedural states: Secondary | ICD-10-CM

## 2020-02-11 DIAGNOSIS — Z794 Long term (current) use of insulin: Secondary | ICD-10-CM

## 2020-02-11 DIAGNOSIS — E875 Hyperkalemia: Secondary | ICD-10-CM | POA: Diagnosis not present

## 2020-02-11 DIAGNOSIS — E785 Hyperlipidemia, unspecified: Secondary | ICD-10-CM

## 2020-02-11 DIAGNOSIS — E119 Type 2 diabetes mellitus without complications: Secondary | ICD-10-CM

## 2020-02-11 DIAGNOSIS — I4811 Longstanding persistent atrial fibrillation: Secondary | ICD-10-CM | POA: Diagnosis not present

## 2020-02-11 LAB — CMP14+EGFR
ALT: 16 IU/L (ref 0–44)
AST: 22 IU/L (ref 0–40)
Albumin/Globulin Ratio: 1.8 (ref 1.2–2.2)
Albumin: 4.2 g/dL (ref 3.8–4.8)
Alkaline Phosphatase: 111 IU/L (ref 48–121)
BUN/Creatinine Ratio: 37 — ABNORMAL HIGH (ref 10–24)
BUN: 62 mg/dL — ABNORMAL HIGH (ref 8–27)
Bilirubin Total: 0.4 mg/dL (ref 0.0–1.2)
CO2: 21 mmol/L (ref 20–29)
Calcium: 8.9 mg/dL (ref 8.6–10.2)
Chloride: 107 mmol/L — ABNORMAL HIGH (ref 96–106)
Creatinine, Ser: 1.67 mg/dL — ABNORMAL HIGH (ref 0.76–1.27)
GFR calc Af Amer: 49 mL/min/{1.73_m2} — ABNORMAL LOW (ref >59)
GFR calc non Af Amer: 42 mL/min/{1.73_m2} — ABNORMAL LOW (ref >59)
Globulin, Total: 2.3 g/dL (ref 1.5–4.5)
Glucose: 160 mg/dL — ABNORMAL HIGH (ref 65–99)
Potassium: 5.9 mmol/L (ref 3.5–5.2)
Sodium: 141 mmol/L (ref 134–144)
Total Protein: 6.5 g/dL (ref 6.0–8.5)

## 2020-02-11 LAB — COAGUCHEK XS/INR WAIVED
INR: 3.4 — ABNORMAL HIGH (ref 0.9–1.1)
Prothrombin Time: 41.2 s

## 2020-02-11 MED ORDER — PRAVASTATIN SODIUM 80 MG PO TABS: ORAL_TABLET | ORAL | 3 refills | Status: DC

## 2020-02-11 MED ORDER — SODIUM POLYSTYRENE SULFONATE PO POWD: ORAL | 0 refills | Status: DC

## 2020-02-11 MED ORDER — FUROSEMIDE 40 MG PO TABS: 40.0000 mg | ORAL_TABLET | Freq: Every day | ORAL | 1 refills | Status: DC

## 2020-02-11 MED ORDER — SODIUM POLYSTYRENE SULFONATE 15 GM/60ML PO SUSP: ORAL | 0 refills | Status: DC

## 2020-02-11 MED ORDER — METFORMIN HCL 500 MG PO TABS: 500.0000 mg | ORAL_TABLET | Freq: Two times a day (BID) | ORAL | 1 refills | Status: DC

## 2020-02-11 MED ORDER — GLIPIZIDE ER 10 MG PO TB24: 10.0000 mg | ORAL_TABLET | Freq: Every day | ORAL | 0 refills | Status: DC

## 2020-02-11 NOTE — Telephone Encounter (Signed)
If they have the liquid that's great. I am starting with just two doses. It depends on his response (BMP on Monday) whether he gets more or not.  If necessary to order by t he dose, just dispense two doses for now, with 2 refills

## 2020-02-11 NOTE — Progress Notes (Signed)
Subjective:  Patient ID: John [name],  male    DOB: [birth year]  Age: 69 y.o.    CC: Protime Recheck and Follow-up (Elevated Potassium)   HPI John [name] presents for  follow-up of hypertension. Patient has no history of headache chest pain or shortness of breath or recent cough. Patient also denies symptoms of TIA such as numbness weakness lateralizing. Patient denies side effects from medication. States taking it regularly.  Patient also  in for follow-up of elevated cholesterol. Doing well without complaints on current medication. Denies side effects  including myalgia and arthralgia and nausea. Also in today for liver function testing. Currently no chest pain, shortness of breath or other cardiovascular related symptoms noted.  Follow-up of diabetes. Patient does check blood sugar at home. Patient denies symptoms such as excessive hunger or urinary frequency, excessive hunger, nausea No significant hypoglycemic spells noted. Medications reviewed. Pt reports taking them regularly. Pt. denies complication/adverse reaction today.   Atrial fibrillation and mechanical valve follow up. Pt. is treated with rate control and anticoagulation. Pt.  denies palpitations, rapid rate, chest pain, dyspnea and edema. There has been no bleeding from nose or gums. Pt. has not noticed blood with urine or stool.  Although there is routine bruising easily, it is not excessive. INR today is 3.4   History John [name] has a past medical history of Allergy, Anemia, Arthritis, Atrial fibrillation ([place]), Bruises easily, CAD (coronary artery disease), CHF (congestive heart failure) ([place]), Constipation, Enlarged prostate, Flu (09/2013), GERD (gastroesophageal reflux disease), Glaucoma, History of blood transfusion, History of colon polyps, HLD (hyperlipidemia), HTN (hypertension), Insomnia, Kidney stones, Nocturia, Peripheral edema, Peripheral neuropathy, Rheumatic fever (~ 1965), Rheumatic heart disease, S/P MVR  (mitral valve replacement) (11/2000), Small bowel cancer ([place]) (2014), SOB (shortness of breath), and Type II diabetes mellitus ([place]).   He has a past surgical history that includes Mitral valve replacement (11/2000); laparotomy (N/A, 02/09/2013); Portacath placement (N/A, 02/09/2013); Coronary artery bypass graft (2002); Septoplasty; Application if wound vac (2014); Colonoscopy; Esophagogastroduodenoscopy; Laparoscopic incisional / umbilical / ventral hernia repair (03/15/2014); Port-a-cath removal (03/15/2014); Hernia repair; Cardiac catheterization (2002); Incisional hernia repair (N/A, 03/15/2014); Port-a-cath removal (Left, 03/15/2014); Insertion of mesh (N/A, 03/15/2014); RIGHT/LEFT HEART CATH AND CORONARY/GRAFT ANGIOGRAPHY (N/A, 05/10/2017); Coronary artery bypass graft; and Small intestine surgery.   His family history includes Arthritis in his brother; Depression in his brother; Diabetes in his brother, brother, and mother; Heart disease in his brother and brother; Hyperlipidemia in his brother and brother; Hypertension in his brother, brother, and father; Thyroid disease in his sister.He reports that he has quit smoking. His smoking use included cigars. He quit after 40.00 years of use. He has never used smokeless tobacco. He reports that he does not drink alcohol or use drugs.  Current Outpatient Medications on File Prior to Visit  Medication Sig Dispense Refill  . aspirin 81 MG chewable tablet Chew 81 mg by mouth daily.    Marland Kitchen dexlansoprazole (DEXILANT) 60 MG capsule Take 1 capsule (60 mg total) by mouth daily. 90 capsule 1  . glucose blood (ONETOUCH VERIO) test strip USE TO TEST TWICE A DAY 200 strip 3  . insulin glargine (LANTUS) 100 UNIT/ML injection Inject 0.85 mLs (85 Units total) into the skin 2 (two) times daily as needed (takes 85 units every night at bedtime 2 nd dose only if needed for elevated blood sugars). (Patient taking differently: Inject 75 Units into the skin at bedtime as needed (blood sugar  around 200 or higher). )  10 mL 5  . JANUVIA 50 MG tablet TAKE 1 TABLET BY MOUTH EVERY DAY (Patient taking differently: Take 50 mg by mouth daily. ) 90 tablet 0  . metoprolol (TOPROL-XL) 200 MG 24 hr tablet Take 1 tablet (200 mg total) by mouth daily after breakfast. 90 tablet 3  . Misc Natural Products (OSTEO BI-FLEX ADV TRIPLE ST) TABS Take 1 tablet by mouth 2 (two) times a week.     . nitroGLYCERIN (NITROSTAT) 0.4 MG SL tablet Place 1 tablet (0.4 mg total) under the tongue every 5 (five) minutes as needed for chest pain. 50 tablet 10  . OneTouch Delica Lancets 63K MISC 1 each by Other route 2 (two) times daily. Dx E11.9 200 each 3  . sacubitril-valsartan (ENTRESTO) 97-103 MG Take 1 tablet by mouth 2 (two) times daily. 180 tablet 2  . triamcinolone cream (KENALOG) 0.1 % Apply 1 application topically 2 (two) times daily. Avoid face and genitalia (Patient taking differently: Apply 1 application topically 2 (two) times daily as needed (redness). Avoid face and genitalia) 45 g 2  . VIAGRA 100 MG tablet Take 0.5 tablets (50 mg total) by mouth as needed. (Patient not taking: Reported on 02/11/2020) 10 tablet 3  . warfarin (COUMADIN) 5 MG tablet Take 1 tablet (5 mg total) by mouth daily. (Patient taking differently: Take 5 mg by mouth every evening. ) 90 tablet 1   No current facility-administered medications on file prior to visit.    ROS Review of Systems  Constitutional: Negative for fever.  Respiratory: Negative for shortness of breath.   Cardiovascular: Negative for chest pain.  Musculoskeletal: Negative for arthralgias.  Skin: Negative for rash.    Objective:  BP 136/63   Pulse 83   Temp 97.9 F (36.6 C) (Temporal)   Ht 5\' 11"  (1.803 m)   Wt 214 lb 6.4 oz (97.3 kg)   BMI 29.90 kg/m   BP Readings from Last 3 Encounters:  02/11/20 136/63  01/15/20 134/66  01/06/20 121/70    Wt Readings from Last 3 Encounters:  02/11/20 214 lb 6.4 oz (97.3 kg)  01/15/20 215 lb 9.6 oz (97.8 kg)    01/06/20 215 lb 2 oz (97.6 kg)     Physical Exam Vitals reviewed.  Constitutional:      Appearance: He is well-developed.  HENT:     Head: Normocephalic and atraumatic.     Right Ear: Tympanic membrane and external ear normal. No decreased hearing noted.     Left Ear: Tympanic membrane and external ear normal. No decreased hearing noted.     Mouth/Throat:     Pharynx: No oropharyngeal exudate or posterior oropharyngeal erythema.  Eyes:     Pupils: Pupils are equal, round, and reactive to light.  Cardiovascular:     Rate and Rhythm: Normal rate and regular rhythm.     Heart sounds: No murmur.  Pulmonary:     Effort: No respiratory distress.     Breath sounds: Normal breath sounds.  Abdominal:     General: Bowel sounds are normal.     Palpations: Abdomen is soft. There is no mass.     Tenderness: There is no abdominal tenderness.  Musculoskeletal:     Cervical back: Normal range of motion and neck supple.      Assessment & Plan:   Lazar was seen today for protime recheck and follow-up.  Diagnoses and all orders for this visit:  Longstanding persistent atrial fibrillation ([place]) -     CoaguChek XS/INR  Waived  Ischemic cardiomyopathy -     furosemide (LASIX) 40 MG tablet; Take 1 tablet (40 mg total) by mouth daily.  Type 2 diabetes mellitus without complication, with long-term current use of insulin ([facility]) -     metFORMIN (GLUCOPHAGE) 500 MG tablet; Take 1 tablet (500 mg total) by mouth 2 (two) times daily with a meal. -     glipiZIDE (GLUCOTROL XL) 10 MG 24 hr tablet; Take 1 tablet (10 mg total) by mouth daily.  Hyperlipidemia, unspecified hyperlipidemia type -     pravastatin (PRAVACHOL) 80 MG tablet; TAKE 1 TABLET BY MOUTH DAILY EVERY EVENING (Patient taking differently: Take 80 mg by mouth every evening. )  Hyperkalemia  Encounter for therapeutic drug monitoring  MITRAL VALVE REPLACEMENT, HX OF  Other orders -     Discontinue: sodium polystyrene (KAYEXALATE)  powder; Take 15 gm once each morning for 2 days -     sodium polystyrene (KAYEXALATE) 15 GM/60ML suspension; Take 60 MLs once daily for 2 days   I have discontinued John [name]'s spironolactone and sodium polystyrene. I have also changed his glipiZIDE. Additionally, I am having him start on sodium polystyrene. Lastly, I am having him maintain his Osteo Bi-Flex Adv Triple St, aspirin, triamcinolone cream, Viagra, nitroGLYCERIN, OneTouch Delica Lancets 69C, Dexilant, insulin glargine, metoprolol, sacubitril-valsartan, warfarin, Januvia, OneTouch Verio, metFORMIN, pravastatin, and furosemide.  Meds ordered this encounter  Medications  . metFORMIN (GLUCOPHAGE) 500 MG tablet    Sig: Take 1 tablet (500 mg total) by mouth 2 (two) times daily with a meal.    Dispense:  180 tablet    Refill:  1  . pravastatin (PRAVACHOL) 80 MG tablet    Sig: TAKE 1 TABLET BY MOUTH DAILY EVERY EVENING    Dispense:  90 tablet    Refill:  3  . furosemide (LASIX) 40 MG tablet    Sig: Take 1 tablet (40 mg total) by mouth daily.    Dispense:  90 tablet    Refill:  1  . glipiZIDE (GLUCOTROL XL) 10 MG 24 hr tablet    Sig: Take 1 tablet (10 mg total) by mouth daily.    Dispense:  90 tablet    Refill:  0    RX IS OUT OF REFILLS  . DISCONTD: sodium polystyrene (KAYEXALATE) powder    Sig: Take 15 gm once each morning for 2 days    Dispense:  454 g    Refill:  0  . sodium polystyrene (KAYEXALATE) 15 GM/60ML suspension    Sig: Take 60 MLs once daily for 2 days    Dispense:  120 mL    Refill:  0     Follow-up: Return in about 2 weeks (around 02/25/2020) for potassium.  Claretta Fraise, M.D.

## 2020-02-11 NOTE — Telephone Encounter (Signed)
Spoke with [facility] it was written correct patient picked up at 11:45am today

## 2020-02-11 NOTE — Patient Instructions (Signed)
Come in for blood work Monday 02/15/20 Stop taking spironolactone

## 2020-02-11 NOTE — Telephone Encounter (Signed)
Received a call from [facility] about rx.  Sodium polystyrene powder  Pharmacist states it normally is given in liquid. States take for 2 days but dispense does not match.   Please advise on if powder is correct and if patient should only be taking for  2 days.  If so send correct dispense.

## 2020-02-14 ENCOUNTER — Encounter: Payer: Self-pay | Admitting: Family Medicine

## 2020-02-15 ENCOUNTER — Other Ambulatory Visit: Payer: Self-pay

## 2020-02-15 ENCOUNTER — Other Ambulatory Visit: Payer: PPO

## 2020-02-15 DIAGNOSIS — E119 Type 2 diabetes mellitus without complications: Secondary | ICD-10-CM | POA: Diagnosis not present

## 2020-02-15 DIAGNOSIS — I4811 Longstanding persistent atrial fibrillation: Secondary | ICD-10-CM | POA: Diagnosis not present

## 2020-02-15 DIAGNOSIS — Z794 Long term (current) use of insulin: Secondary | ICD-10-CM | POA: Diagnosis not present

## 2020-02-15 DIAGNOSIS — Z7901 Long term (current) use of anticoagulants: Secondary | ICD-10-CM

## 2020-02-15 DIAGNOSIS — Z9889 Other specified postprocedural states: Secondary | ICD-10-CM

## 2020-02-16 ENCOUNTER — Other Ambulatory Visit (HOSPITAL_COMMUNITY): Payer: PPO

## 2020-02-16 LAB — CMP14+EGFR
ALT: 12 IU/L (ref 0–44)
AST: 15 IU/L (ref 0–40)
Albumin/Globulin Ratio: 1.8 (ref 1.2–2.2)
Albumin: 3.9 g/dL (ref 3.8–4.8)
Alkaline Phosphatase: 102 IU/L (ref 48–121)
BUN/Creatinine Ratio: 41 — ABNORMAL HIGH (ref 10–24)
BUN: 53 mg/dL — ABNORMAL HIGH (ref 8–27)
Bilirubin Total: 0.3 mg/dL (ref 0.0–1.2)
CO2: 22 mmol/L (ref 20–29)
Calcium: 9 mg/dL (ref 8.6–10.2)
Chloride: 106 mmol/L (ref 96–106)
Creatinine, Ser: 1.3 mg/dL — ABNORMAL HIGH (ref 0.76–1.27)
GFR calc Af Amer: 66 mL/min/{1.73_m2} (ref >59)
GFR calc non Af Amer: 57 mL/min/{1.73_m2} — ABNORMAL LOW (ref >59)
Globulin, Total: 2.2 g/dL (ref 1.5–4.5)
Glucose: 115 mg/dL — ABNORMAL HIGH (ref 65–99)
Potassium: 5 mmol/L (ref 3.5–5.2)
Sodium: 140 mmol/L (ref 134–144)
Total Protein: 6.1 g/dL (ref 6.0–8.5)

## 2020-02-19 ENCOUNTER — Encounter (HOSPITAL_COMMUNITY)
Admission: RE | Disposition: A | Discharge status: Home / Self Care | Payer: PPO | Source: Home / Self Care | Attending: Internal Medicine

## 2020-02-19 ENCOUNTER — Ambulatory Visit (HOSPITAL_COMMUNITY): Payer: PPO

## 2020-02-19 ENCOUNTER — Ambulatory Visit (HOSPITAL_COMMUNITY)
Admission: RE | Admit: 2020-02-19 | Discharge: 2020-02-19 | Disposition: A | Discharge status: Home / Self Care | Payer: PPO | Attending: Internal Medicine | Admitting: Internal Medicine

## 2020-02-19 ENCOUNTER — Other Ambulatory Visit: Payer: Self-pay

## 2020-02-19 DIAGNOSIS — E1140 Type 2 diabetes mellitus with diabetic neuropathy, unspecified: Secondary | ICD-10-CM | POA: Insufficient documentation

## 2020-02-19 DIAGNOSIS — I4892 Unspecified atrial flutter: Secondary | ICD-10-CM | POA: Diagnosis not present

## 2020-02-19 DIAGNOSIS — I5020 Unspecified systolic (congestive) heart failure: Secondary | ICD-10-CM | POA: Diagnosis present

## 2020-02-19 DIAGNOSIS — Z87891 Personal history of nicotine dependence: Secondary | ICD-10-CM | POA: Diagnosis not present

## 2020-02-19 DIAGNOSIS — Z85068 Personal history of other malignant neoplasm of small intestine: Secondary | ICD-10-CM | POA: Diagnosis not present

## 2020-02-19 DIAGNOSIS — Z7901 Long term (current) use of anticoagulants: Secondary | ICD-10-CM | POA: Insufficient documentation

## 2020-02-19 DIAGNOSIS — K219 Gastro-esophageal reflux disease without esophagitis: Secondary | ICD-10-CM | POA: Diagnosis not present

## 2020-02-19 DIAGNOSIS — Z881 Allergy status to other antibiotic agents status: Secondary | ICD-10-CM | POA: Insufficient documentation

## 2020-02-19 DIAGNOSIS — E785 Hyperlipidemia, unspecified: Secondary | ICD-10-CM | POA: Diagnosis not present

## 2020-02-19 DIAGNOSIS — Z794 Long term (current) use of insulin: Secondary | ICD-10-CM | POA: Insufficient documentation

## 2020-02-19 DIAGNOSIS — I110 Hypertensive heart disease with heart failure: Secondary | ICD-10-CM | POA: Diagnosis not present

## 2020-02-19 DIAGNOSIS — Z952 Presence of prosthetic heart valve: Secondary | ICD-10-CM | POA: Diagnosis not present

## 2020-02-19 DIAGNOSIS — Z7982 Long term (current) use of aspirin: Secondary | ICD-10-CM | POA: Insufficient documentation

## 2020-02-19 DIAGNOSIS — I2510 Atherosclerotic heart disease of native coronary artery without angina pectoris: Secondary | ICD-10-CM | POA: Diagnosis not present

## 2020-02-19 DIAGNOSIS — I517 Cardiomegaly: Secondary | ICD-10-CM | POA: Diagnosis not present

## 2020-02-19 DIAGNOSIS — Z8572 Personal history of non-Hodgkin lymphomas: Secondary | ICD-10-CM | POA: Insufficient documentation

## 2020-02-19 DIAGNOSIS — I252 Old myocardial infarction: Secondary | ICD-10-CM | POA: Diagnosis not present

## 2020-02-19 DIAGNOSIS — Z95810 Presence of automatic (implantable) cardiac defibrillator: Secondary | ICD-10-CM

## 2020-02-19 DIAGNOSIS — I5022 Chronic systolic (congestive) heart failure: Secondary | ICD-10-CM | POA: Diagnosis not present

## 2020-02-19 DIAGNOSIS — Z79899 Other long term (current) drug therapy: Secondary | ICD-10-CM | POA: Insufficient documentation

## 2020-02-19 DIAGNOSIS — Z006 Encounter for examination for normal comparison and control in clinical research program: Secondary | ICD-10-CM | POA: Diagnosis not present

## 2020-02-19 DIAGNOSIS — I255 Ischemic cardiomyopathy: Secondary | ICD-10-CM | POA: Diagnosis not present

## 2020-02-19 DIAGNOSIS — J939 Pneumothorax, unspecified: Secondary | ICD-10-CM | POA: Diagnosis not present

## 2020-02-19 DIAGNOSIS — I482 Chronic atrial fibrillation, unspecified: Secondary | ICD-10-CM | POA: Insufficient documentation

## 2020-02-19 DIAGNOSIS — Z951 Presence of aortocoronary bypass graft: Secondary | ICD-10-CM | POA: Diagnosis not present

## 2020-02-19 DIAGNOSIS — Z9221 Personal history of antineoplastic chemotherapy: Secondary | ICD-10-CM | POA: Diagnosis not present

## 2020-02-19 HISTORY — PX: ICD IMPLANT: EP1208

## 2020-02-19 LAB — GLUCOSE, CAPILLARY: Glucose-Capillary: 104 mg/dL — ABNORMAL HIGH (ref 70–99)

## 2020-02-19 SURGERY — ICD IMPLANT

## 2020-02-19 MED ORDER — CEFAZOLIN SODIUM-DEXTROSE 2-4 GM/100ML-% IV SOLN
2.0000 g | INTRAVENOUS | Status: AC
  Administered 2020-02-19: 2 g via INTRAVENOUS
  Filled 2020-02-19: qty 100

## 2020-02-19 MED ORDER — LIDOCAINE HCL (PF) 1 % IJ SOLN
INTRAMUSCULAR | Status: DC | PRN
  Administered 2020-02-19: 50 mL

## 2020-02-19 MED ORDER — HEPARIN (PORCINE) IN NACL 1000-0.9 UT/500ML-% IV SOLN
INTRAVENOUS | Status: DC | PRN
  Administered 2020-02-19: 500 mL

## 2020-02-19 MED ORDER — MIDAZOLAM HCL 5 MG/5ML IJ SOLN
INTRAMUSCULAR | Status: AC
  Filled 2020-02-19: qty 5

## 2020-02-19 MED ORDER — SODIUM CHLORIDE 0.9 % IV SOLN
INTRAVENOUS | Status: AC
  Filled 2020-02-19: qty 2

## 2020-02-19 MED ORDER — MIDAZOLAM HCL 5 MG/5ML IJ SOLN
INTRAMUSCULAR | Status: DC | PRN
  Administered 2020-02-19: 1 mg via INTRAVENOUS
  Administered 2020-02-19: 2 mg via INTRAVENOUS
  Administered 2020-02-19: 1 mg via INTRAVENOUS
  Administered 2020-02-19: 2 mg via INTRAVENOUS

## 2020-02-19 MED ORDER — FENTANYL CITRATE (PF) 100 MCG/2ML IJ SOLN
INTRAMUSCULAR | Status: AC
  Filled 2020-02-19: qty 2

## 2020-02-19 MED ORDER — LIDOCAINE HCL 1 % IJ SOLN
INTRAMUSCULAR | Status: AC
  Filled 2020-02-19: qty 60

## 2020-02-19 MED ORDER — CHLORHEXIDINE GLUCONATE 4 % EX LIQD
4.0000 "application " | Freq: Once | CUTANEOUS | Status: DC
  Filled 2020-02-19: qty 60

## 2020-02-19 MED ORDER — HEPARIN (PORCINE) IN NACL 1000-0.9 UT/500ML-% IV SOLN
INTRAVENOUS | Status: AC
  Filled 2020-02-19: qty 500

## 2020-02-19 MED ORDER — ONDANSETRON HCL 4 MG/2ML IJ SOLN: 4.0000 mg | Freq: Four times a day (QID) | INTRAMUSCULAR | Status: DC | PRN

## 2020-02-19 MED ORDER — FENTANYL CITRATE (PF) 100 MCG/2ML IJ SOLN
INTRAMUSCULAR | Status: DC | PRN
  Administered 2020-02-19: 12.5 ug via INTRAVENOUS
  Administered 2020-02-19 (×2): 25 ug via INTRAVENOUS

## 2020-02-19 MED ORDER — SODIUM CHLORIDE 0.9 % IV SOLN: INTRAVENOUS | Status: DC

## 2020-02-19 MED ORDER — CEFAZOLIN SODIUM-DEXTROSE 1-4 GM/50ML-% IV SOLN: 1.0000 g | Freq: Four times a day (QID) | INTRAVENOUS | Status: DC

## 2020-02-19 MED ORDER — ACETAMINOPHEN 325 MG PO TABS: 325.0000 mg | ORAL_TABLET | ORAL | Status: DC | PRN

## 2020-02-19 MED ORDER — CEFAZOLIN SODIUM-DEXTROSE 2-4 GM/100ML-% IV SOLN
INTRAVENOUS | Status: AC
  Filled 2020-02-19: qty 100

## 2020-02-19 MED ORDER — SODIUM CHLORIDE 0.9 % IV SOLN
80.0000 mg | INTRAVENOUS | Status: AC
  Administered 2020-02-19: 80 mg

## 2020-02-19 SURGICAL SUPPLY — 6 items
CABLE SURGICAL S-101-97-12 (CABLE) ×3 IMPLANT
ICD GALLANT VR CDVRA500Q (ICD Generator) ×2 IMPLANT
LEAD DURATA 7122Q-58CM (Lead) ×2 IMPLANT
PAD PRO RADIOLUCENT 2001M-C (PAD) ×3 IMPLANT
SHEATH 7FR PRELUDE SNAP 13 (SHEATH) ×2 IMPLANT
TRAY PACEMAKER INSERTION (PACKS) ×3 IMPLANT

## 2020-02-19 NOTE — H&P (Signed)
HPI [name] is referred today by Dr. [facility] to consider insertion of an ICD. He is a pleasant 69 yo man with a h/o rheumatic heart disease, s/p MVR over 20 years ago. He has chronic atrial fib. He has a h/o LV dysfunction which has worsened despite maximal medical therapy under the direction of Dr. Lenore Cordia. He also has CAD and is s/p CABG. He has a known occlusion of an SVG to the RCA. However there was a patent native RCA. He has been on chronic coumadin therapy. His valve function is ok. He has scar inferiorly. 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 He had a port a cath on the left. He is left handed. He denies syncope, chest pain and he has class 2 dyspnea.      Allergies  Allergen Reactions  . Doxycycline Hives           Current Outpatient Medications  Medication Sig Dispense Refill  . aspirin 81 MG chewable tablet Chew 81 mg by mouth daily.    Marland Kitchen CIALIS 10 MG tablet Take 10 mg by mouth as needed for erectile dysfunction.    Marland Kitchen dexlansoprazole (DEXILANT) 60 MG capsule Take 1 capsule (60 mg total) by mouth daily. 90 capsule 1  . furosemide (LASIX) 40 MG tablet Take 1 tablet (40 mg total) by mouth daily. 90 tablet 1  . glipiZIDE (GLUCOTROL XL) 10 MG 24 hr tablet TAKE 1 TABLET BY MOUTH EVERY DAY 90 tablet 0  . glucose blood (ONETOUCH VERIO) test strip USE TO TEST TWICE A DAY 200 strip 3  . insulin glargine (LANTUS) 100 UNIT/ML injection Inject 0.85 mLs (85 Units total) into the skin 2 (two) times daily as needed (takes 85 units every night at bedtime 2 nd dose only if needed for elevated blood sugars). 10 mL 5  . JANUVIA 50 MG tablet TAKE 1 TABLET BY MOUTH EVERY DAY 90 tablet 0  . metFORMIN (GLUCOPHAGE) 500 MG tablet TAKE 1 TABLET (500 MG TOTAL) BY MOUTH 2 (TWO) TIMES DAILY WITH A MEAL. 180 tablet 1  . metoprolol (TOPROL-XL) 200 MG 24 hr tablet Take 1 tablet (200 mg total) by mouth daily after breakfast. 90 tablet 3  . Misc Natural  Products (OSTEO BI-FLEX ADV TRIPLE ST) TABS Take 500 each by mouth daily.     . nitroGLYCERIN (NITROSTAT) 0.4 MG SL tablet Place 1 tablet (0.4 mg total) under the tongue every 5 (five) minutes as needed for chest pain. 50 tablet 10  . OneTouch Delica Lancets 81E MISC 1 each by Other route 2 (two) times daily. Dx E11.9 200 each 3  . pravastatin (PRAVACHOL) 80 MG tablet TAKE 1 TABLET BY MOUTH DAILY EVERY EVENING 90 tablet 3  . sacubitril-valsartan (ENTRESTO) 97-103 MG Take 1 tablet by mouth 2 (two) times daily. 180 tablet 2  . spironolactone (ALDACTONE) 25 MG tablet TAKE 1 TABLET BY MOUTH EVERY DAY 90 tablet 0  . triamcinolone cream (KENALOG) 0.1 % Apply 1 application topically 2 (two) times daily. Avoid face and genitalia 45 g 2  . VIAGRA 100 MG tablet Take 0.5 tablets (50 mg total) by mouth as needed. 10 tablet 3  . warfarin (COUMADIN) 5 MG tablet Take 1 tablet (5 mg total) by mouth daily. 90 tablet 1   No current facility-administered medications for this visit.         Past Medical History:  Diagnosis Date  . Allergy   .  Anemia    "lost blood w/the cancer"  . Arthritis    back   . Atrial fibrillation ([place])    takes Coumadin daily  . Bruises easily    takes Coumadin daily  . CAD (coronary artery disease)    a. s/p CABG 11/2000 (L-LAD, S-RCA);  b. Lexiscan Myoview 11/12: EF 45%, ischemia and possibly some scar at the base of the inferolateral wall;   c. Lex MV 11/13:  EF 44%, Inf and IL scar/soft tissue atten, small mild amt of inf ischemia,  . CHF (congestive heart failure) ([place])   . Constipation    takes Colace daily  . Enlarged prostate   . Flu 09/2013  . GERD (gastroesophageal reflux disease)    takes Nexium daily  . Glaucoma   . History of blood transfusion    "related to cancer"  . History of colon polyps   . HLD (hyperlipidemia)    takes Vytorin daily  . HTN (hypertension)    takes Amlodipine,Metoprolol, and Ramipril daily  . Insomnia      states he has always been like this.But doesn't take any meds  . Kidney stones    "I passed them all"  . Nocturia   . Peripheral edema    takes Furosemide daily  . Peripheral neuropathy   . Rheumatic fever ~ 1965  . Rheumatic heart disease    a. s/p mechanical (St. Jude) MVR 2002;  b. Echo 5/11: Mild LVH, EF 45-50%, mild AS, mild AI, mean gradient 11 mmHg, MVR with normal gradients, moderate LAE, mild RAE;  c.  Echo 11/13:   mod LVH, EF 55-60%, mild AS (mean 18 mmHg), mild AI, severe LAE, mild RAE, PASP 41  . 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 Small bowel cancer ([place]) 2014  . SOB (shortness of breath)    with exertion  . Type II diabetes mellitus ([place])    takes Januvia,Glipizide,and Metformin daily as well as Lantus    ROS:   All systems reviewed and negative except as noted in the HPI.        Past Surgical History:  Procedure Laterality Date  . APPLICATION OF WOUND VAC  2014  . CARDIAC CATHETERIZATION  2002  . COLONOSCOPY    . CORONARY ARTERY BYPASS GRAFT  2002   x 3  . CORONARY ARTERY BYPASS GRAFT    . ESOPHAGOGASTRODUODENOSCOPY    . HERNIA REPAIR    . INCISIONAL HERNIA REPAIR N/A 03/15/2014   Procedure: LAPAROSCOPIC INCISIONAL HERNIA;  Surgeon: Harl Bowie, MD;  Location: [place];  Service: General;  Laterality: N/A;  . INSERTION OF MESH N/A 03/15/2014   Procedure: INSERTION OF MESH;  Surgeon: Harl Bowie, MD;  Location: [place];  Service: General;  Laterality: N/A;  . LAPAROSCOPIC INCISIONAL / UMBILICAL / [place]  03/15/2014   IHR  . LAPAROTOMY N/A 02/09/2013   Procedure: EXPLORATORY LAPAROTOMY WITH incisional biopsy intra- ABDOMINAL MASS ILOCECTOMY ;  Surgeon: Harl Bowie, MD;  Location: [facility];  Service: General;  Laterality: N/A;  . MITRAL VALVE REPLACEMENT  11/2000   #33 St. Jude mechanical mitral valve prosthesis. Dr. Servando Snare  . PORT-A-CATH REMOVAL  03/15/2014  . PORT-A-CATH REMOVAL Left  03/15/2014   Procedure: REMOVAL PORT-A-CATH;  Surgeon: Harl Bowie, MD;  Location: [place];  Service: General;  Laterality: Left;  . PORTACATH PLACEMENT N/A 02/09/2013   Procedure: INSERTION PORT-A-CATH;  Surgeon: Harl Bowie, MD;  Location: [facility];  Service:  General;  Laterality: N/A;  . RIGHT/LEFT HEART CATH AND CORONARY/GRAFT ANGIOGRAPHY N/A 05/10/2017   Procedure: RIGHT/LEFT HEART CATH AND CORONARY/GRAFT ANGIOGRAPHY;  Surgeon: Martinique, Peter M, MD;  Location: [facility];  Service: Cardiovascular;  Laterality: N/A;  . SEPTOPLASTY    . SMALL INTESTINE SURGERY            Family History  Problem Relation Age of Onset  . Diabetes Mother   . Hypertension Father   . Thyroid disease Sister   . Diabetes Brother   . Arthritis Brother   . Depression Brother   . Heart disease Brother   . Hyperlipidemia Brother   . Hypertension Brother   . Hypertension Brother   . Diabetes Brother   . Heart disease Brother   . Hyperlipidemia Brother      Social History        Socioeconomic History  . Marital status: Married    Spouse name: Marcie Bal  . Number of children: 1  . Years of education: 31  . Highest education level: High school graduate  Occupational History  . Occupation: disabled  Tobacco Use  . Smoking status: Former Smoker    Years: 40.00    Types: Cigars  . Smokeless tobacco: Never Used  Substance and Sexual Activity  . Alcohol use: No    Comment: "drank for 30 years; quit ~ 2000"  . Drug use: No    Types: Marijuana    Comment: quit in 2000 'reefer'  . Sexual activity: Yes  Other Topics Concern  . 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   Social Determinants of Health      Financial Resource Strain: Low Risk   . Difficulty of Paying Living Expenses: Not hard at all  Food Insecurity: No Food Insecurity  . Worried About Charity fundraiser in the Last Year: Never true    . Ran Out of Food in the Last Year: Never true  Transportation Needs: No Transportation Needs  . Lack of Transportation (Medical): No  . Lack of Transportation (Non-Medical): No  Physical Activity: Sufficiently Active  . Days of Exercise per Week: 3 days  . Minutes of Exercise per Session: 120 min  Stress: No Stress Concern Present  . Feeling of Stress : Not at all  Social Connections: Somewhat Isolated  . Frequency of Communication with Friends and Family: More than three times a week  . Frequency of Social Gatherings with Friends and Family: More than three times a week  . Attends Religious Services: Never  . Active Member of Clubs or Organizations: No  . Attends Archivist Meetings: Never  . Marital Status: Married  Human resources officer Violence: Not At Risk  . Fear of Current or Ex-Partner: No  . Emotionally Abused: No  . Physically Abused: No  . Sexually Abused: No     BP 134/66   Pulse 77   Ht 5\' 11"  (1.803 m)   Wt 215 lb 9.6 oz (97.8 kg)   SpO2 99%   BMI 30.07 kg/m   Physical Exam:  Well appearing NAD HEENT: Unremarkable Neck:  No JVD, no thyromegally Lymphatics:  No adenopathy Back:  No CVA tenderness Lungs:  Clear with no wheezes HEART:  Regular rate rhythm, no murmurs, no rubs, no clicks Abd:  soft, positive bowel sounds, no organomegally, no rebound, no guarding Ext:  2 plus pulses, no edema, no cyanosis, no clubbing Skin:  No rashes no  nodules Neuro:  CN II through XII intact, motor grossly intact  EKG - atrial fib with RBBB   Assess/Plan: 1. Chronic systolic heart failure - his most recent EF is 25% which is worse than the echo 2 years ago which was 30%. He has been on optimal medical therapy. I have discussed the indications, risks, benefits, goals, expectations of ICD insertion with the patient and he wishes to proceed. 2. ICM - he is s/p MI and has no angina. He will continue his current medical therapy. 3. Chronic atrial fib - his  rate appears to be well controlled. He denies palpitations. Once his device is in place we will be able to monitor his rate control.  4. MVR - he has a recent echo demonstrating normal mechanical valve function with a mean gradient of 4 and no MR. 5. Coags - with regard to ICD insertion, I would want him to hold his coumadin for 2 days, to get the INR in the under 3 range, and then restart coumadin after surgery. No lovenox.   Ponciano Ort.  EP Attending  Patient seen and examined. Since prior clinic visit, no change.  Mikle Bosworth.D.

## 2020-02-19 NOTE — Discharge Instructions (Signed)
After Your ICD (Implantable Cardiac Defibrillator)  RESUME COUMADIN 02/20/2020 in the PM.   . You have a St. Jude ICD  ACTIVITY . Do not lift your arm above shoulder height for 1 week after your procedure. After 7 days, you may progress as below.     Friday February 26, 2020  Saturday February 27, 2020 Sunday February 28, 2020 Monday February 29, 2020   . Do not lift, push, pull, or carry anything over 10 pounds with the affected arm until 6 weeks (Friday April 01, 2020 ) after your procedure.   . Do NOT DRIVE until you have been seen for your wound check, or as long as instructed by your healthcare provider.   . Ask your healthcare provider when you can go back to work   INCISION/Dressing . If you are on a blood thinner such as Coumadin, Xarelto, Eliquis, Plavix, or Pradaxa please confirm with your provider when this should be resumed.   . Monitor your defibrillator site for redness, swelling, and drainage. Call the device clinic at [phone] if you experience these symptoms or fever/chills.  . If your incision is sealed with Steri-strips or staples, you may shower 10 days after your procedure or when told by your provider. Do not remove the steri-strips or let the shower hit directly on your site. You may wash around your site with soap and water.    Marland Kitchen Avoid lotions, ointments, or perfumes over your incision until it is well-healed.  . You may use a hot tub or a pool AFTER your wound check appointment if the incision is completely closed.  . Your ICD is designed to protect you from life threatening heart rhythms. Because of this, you may receive a shock.   o 1 shock with no symptoms:  Call the office during business hours. o 1 shock with symptoms (chest pain, chest pressure, dizziness, lightheadedness, shortness of breath, overall feeling unwell):  Call 911. o If you experience 2 or more shocks in 24 hours:  Call 911. o If you receive a shock, you should not drive for 6 months per the [place] DMV  IF you receive appropriate therapy from your ICD.   . ICD Alerts:  Some alerts are vibratory and others beep. These are NOT emergencies. Please call our office to let us know. If this occurs at night or on weekends, it can wait until the next business day. Send a remote transmission.  . If your device is capable of reading fluid status (for heart failure), you will be offered monthly monitoring to review this with you.   DEVICE MANAGEMENT . Remote monitoring is used to monitor your ICD from home. This monitoring is scheduled every 91 days by our office. It allows [place] to keep an eye on the functioning of your device to ensure it is working properly. You will routinely see your Electrophysiologist annually (more often if necessary).   . You should receive your ID card for your new device in 4-8 weeks. Keep this card with you at all times once received. Consider wearing a medical alert bracelet or necklace.  . Your ICD  may be MRI compatible. This will be discussed at your next office visit/wound check.  You should avoid contact with strong electric or magnetic fields.    Do not use amateur (ham) radio equipment or electric (arc) welding torches. MP3 player headphones with magnets should not be used. Some devices are safe to use if held at least 12 inches (30 cm)  from your defibrillator. These include power tools, lawn mowers, and speakers. If you are unsure if something is safe to use, ask your health care provider.   When using your cell phone, hold it to the ear that is on the opposite side from the defibrillator. Do not leave your cell phone in a pocket over the defibrillator.   You may safely use electric blankets, heating pads, computers, and microwave ovens.  Call the office right away if:  You have chest pain.  You feel more than one shock.  You feel more short of breath than you have felt before.  You feel more light-headed than you have felt before.  Your incision starts to open  up.  This information is not intended to replace advice given to you by your health care provider. Make sure you discuss any questions you have with your health care provider.

## 2020-02-20 ENCOUNTER — Telehealth: Payer: Self-pay | Admitting: Student

## 2020-02-20 NOTE — Telephone Encounter (Signed)
   Received page from Answering Service about patient's wife having concerns after ICD placed yesterday. Called and spoke with wife. Wife states patient having a lot pain in his left hand and it is a little swollen ("puffy"). No erythema on warmth to the touch. In fact patient states his hand is a little cold but no colder than his right hand. No pain at incision site. He thinks pain is from where IV was placed. Patient has been taking 200mg  of ibuprofen but not much improvement. Given his cardiac history, recommend Tylenol every 4-6 hours over ibuprofen. Ok to ice hand to see if that help and recommend trying to keep hand at heart level (but emphasized importance of not raising shoulder and following discharge instructions regarding that). Patient/wife to let us know if swelling/pain worsens or any worsening symptoms.   Darreld Mclean, PA-C 02/20/2020 10:58 AM

## 2020-02-22 ENCOUNTER — Encounter (HOSPITAL_COMMUNITY): Payer: Self-pay | Admitting: Internal Medicine

## 2020-02-22 MED FILL — Lidocaine HCl Local Inj 1%: INTRAMUSCULAR | Qty: 60 | Status: AC

## 2020-02-28 NOTE — H&P (Signed)
ICD Criteria  Current LVEF:25%. Within 12 months prior to implant: Yes   Heart failure history: Yes, Class II  Cardiomyopathy history: Yes, Ischemic Cardiomyopathy - Prior MI.  Atrial Fibrillation/Atrial Flutter: Yes, Permanent.  Ventricular tachycardia history: No.  Cardiac arrest history: No.  History of syndromes with risk of sudden death: No.  Previous ICD: No.  Current ICD indication: Primary  PPM indication: No.  Class I or II Bradycardia indication present: No  Beta Blocker therapy for 3 or more months: Yes, prescribed.   Ace Inhibitor/ARB therapy for 3 or more months: Yes, prescribed.    I have seen John [name] is a 69 y.o. malepre-procedural and has been referred by Dr. Percival Spanish for consideration of ICD implant for primary prevention of sudden death.  The patient's chart has been reviewed and they meet criteria for ICD implant.  I have had a thorough discussion with the patient reviewing options.  The patient and their family (if available) have had opportunities to ask questions and have them answered. The patient and I have decided together through the [place] Support Tool to proceed with ICD at this time.  Risks, benefits, alternatives to ICD implantation were discussed in detail with the patient today. The patient  understands that the risks include but are not limited to bleeding, infection, pneumothorax, perforation, tamponade, vascular damage, renal failure, MI, stroke, death, inappropriate shocks, and lead dislodgement and he wishes to proceed.

## 2020-03-01 ENCOUNTER — Other Ambulatory Visit: Payer: Self-pay

## 2020-03-01 ENCOUNTER — Telehealth: Payer: Self-pay | Admitting: Emergency Medicine

## 2020-03-01 ENCOUNTER — Ambulatory Visit (INDEPENDENT_AMBULATORY_CARE_PROVIDER_SITE_OTHER): Payer: PPO | Admitting: Emergency Medicine

## 2020-03-01 ENCOUNTER — Telehealth: Payer: Self-pay

## 2020-03-01 DIAGNOSIS — I255 Ischemic cardiomyopathy: Secondary | ICD-10-CM | POA: Diagnosis not present

## 2020-03-01 LAB — CUP PACEART INCLINIC DEVICE CHECK
Battery Remaining Longevity: 106 mo
Brady Statistic RV Percent Paced: 1 %
Date Time Interrogation Session: 20210622090600
HighPow Impedance: 69.75 Ohm
Implantable Lead Implant Date: 20210611
Implantable Lead Location: 753860
Implantable Pulse Generator Implant Date: 20210611
Lead Channel Impedance Value: 575 Ohm
Lead Channel Pacing Threshold Amplitude: 0.75 V
Lead Channel Pacing Threshold Amplitude: 0.75 V
Lead Channel Pacing Threshold Pulse Width: 0.5 ms
Lead Channel Pacing Threshold Pulse Width: 0.5 ms
Lead Channel Sensing Intrinsic Amplitude: 12 mV
Lead Channel Setting Pacing Amplitude: 3.5 V
Lead Channel Setting Pacing Pulse Width: 0.5 ms
Lead Channel Setting Sensing Sensitivity: 0.5 mV
Pulse Gen Serial Number: 810002163

## 2020-03-01 NOTE — Telephone Encounter (Signed)
Contacted Jan RN at Dr Livia Snellen office to notify her that the patient would be calling to be seen for edema in LLE. Report given. Made aware that patient has 3+ edema from right elbow to the right hand. Patient reports 6/10 pain in right elbow and forearm that increases with flexion and extension at the elbow. RUE has 2+ radial pulse, is warm to touch, and has good cap refill. No edema or pain in right bicep, right shoulder, and no pain or edema at device site in right upper cx.shoulder. Jan aware patient will be calling to be evaluated ASAP. Elevation of RUE and Tylenol recommended for pain today at device wound check. Implant date of ICD was 02/19/20.

## 2020-03-01 NOTE — Progress Notes (Signed)
Wound check appointment. Steri-strips removed. Wound without redness or edema. Incision edges approximated, wound well healed. Normal device function. Thresholds, sensing, and impedances consistent with implant measurements. Device programmed at 3.5V for extra safety margin until 3 month visit. Histogram distribution appropriate for patient and level of activity. No ventricular arrhythmias noted. Patient educated about wound care, arm mobility, lifting restrictions, shock plan. ROV with Dr Lovena Le on 05/25/20. Enrolled in remote monitoring, next remote 05/20/20.

## 2020-03-01 NOTE — Patient Instructions (Signed)
Call your family doctor after this visit to schedule appointment to have right arm evaluated as soon as possible. Call the device clinic if you have increased swelling , drainage or redness at the wound site. Call the office if you have a fever or chills and if you have a fever or chills after hours seek treatment .

## 2020-03-01 NOTE — Telephone Encounter (Signed)
Received phone call from nurse Jenny Reichmann at [facility], patient saw them today.  She reported:  Contacted Jan RN at Dr Livia Snellen office to notify her that the patient would be calling to be seen for edema in LLE. Report given. Made aware that patient has 3+ edema from right elbow to the right hand. Patient reports 6/10 pain in right elbow and forearm that increases with flexion and extension at the elbow. RUE has 2+ radial pulse, is warm to touch, and has good cap refill. No edema or pain in right bicep, right shoulder, and no pain or edema at device site in right upper cx.shoulder. Jan aware patient will be calling to be evaluated ASAP. Elevation of RUE and Tylenol recommended for pain today at device wound check. Implant date of ICD was 02/19/20.  Patient has not called [place] as of yet.  Please advise and route to pools.

## 2020-03-02 ENCOUNTER — Ambulatory Visit: Payer: PPO | Admitting: Family Medicine

## 2020-03-10 ENCOUNTER — Ambulatory Visit (INDEPENDENT_AMBULATORY_CARE_PROVIDER_SITE_OTHER): Payer: PPO | Admitting: Family Medicine

## 2020-03-10 ENCOUNTER — Other Ambulatory Visit: Payer: Self-pay

## 2020-03-10 ENCOUNTER — Encounter: Payer: Self-pay | Admitting: Family Medicine

## 2020-03-10 VITALS — BP 135/73 | HR 88 | Temp 96.6°F | Resp 20 | Ht 71.0 in | Wt 211.0 lb

## 2020-03-10 DIAGNOSIS — E875 Hyperkalemia: Secondary | ICD-10-CM | POA: Diagnosis not present

## 2020-03-10 DIAGNOSIS — I4811 Longstanding persistent atrial fibrillation: Secondary | ICD-10-CM

## 2020-03-10 LAB — COAGUCHEK XS/INR WAIVED
INR: 2.6 — ABNORMAL HIGH (ref 0.9–1.1)
Prothrombin Time: 30.8 s

## 2020-03-10 LAB — BMP8+EGFR
BUN/Creatinine Ratio: 40 — ABNORMAL HIGH (ref 10–24)
BUN: 55 mg/dL — ABNORMAL HIGH (ref 8–27)
CO2: 21 mmol/L (ref 20–29)
Calcium: 9.1 mg/dL (ref 8.6–10.2)
Chloride: 105 mmol/L (ref 96–106)
Creatinine, Ser: 1.39 mg/dL — ABNORMAL HIGH (ref 0.76–1.27)
GFR calc Af Amer: 61 mL/min/{1.73_m2} (ref >59)
GFR calc non Af Amer: 52 mL/min/{1.73_m2} — ABNORMAL LOW (ref >59)
Glucose: 98 mg/dL (ref 65–99)
Potassium: 4.9 mmol/L (ref 3.5–5.2)
Sodium: 142 mmol/L (ref 134–144)

## 2020-03-10 NOTE — Progress Notes (Signed)
Subjective:  Patient ID: John [name], male    DOB: Nov 06, 1950  Age: 69 y.o. MRN: [id number]  CC: Anticoagulation   HPI John [name] presents for Atrial fibrillation follow up. Pt. is treated with rate control and anticoagulation. Pt.  denies palpitations, rapid rate, chest pain, dyspnea and edema. There has been no bleeding from nose or gums. Pt. has not noticed blood with urine or stool.  Although there is routine bruising easily, it is not excessive.  Had defib implanted just a week or two ago. He has had severe cardiomyopathy. Currently stable. His right arm was placed in a sling, but the hand was not supported.R arm swollen since.  Off diet. Glucose PP up to 200   Depression screen John [name] 2/9 03/10/2020 02/11/2020 01/06/2020  Decreased Interest 0 0 0  Down, Depressed, Hopeless 0 0 0  PHQ - 2 Score 0 0 0  Altered sleeping - - -  Tired, decreased energy - - -  Change in appetite - - -  Feeling bad or failure about yourself  - - -  Trouble concentrating - - -  Moving slowly or fidgety/restless - - -  Suicidal thoughts - - -  PHQ-9 Score - - -  Some recent data might be hidden    History John [name] has a past medical history of Allergy, Anemia, Arthritis, Atrial fibrillation ([place]), Bruises easily, CAD (coronary artery disease), CHF (congestive heart failure) ([place]), Constipation, Enlarged prostate, Flu (09/2013), GERD (gastroesophageal reflux disease), Glaucoma, History of blood transfusion, History of colon polyps, HLD (hyperlipidemia), HTN (hypertension), Insomnia, Kidney stones, Nocturia, Peripheral edema, Peripheral neuropathy, Rheumatic fever (~ 1965), Rheumatic heart disease, S/P MVR (mitral valve replacement) (11/2000), 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   He has a past surgical history that includes Mitral valve replacement (11/2000); laparotomy (N/A, 02/09/2013); Portacath placement (N/A, 02/09/2013); Coronary artery bypass  graft (2002); Septoplasty; Application if wound vac (2014); Colonoscopy; Esophagogastroduodenoscopy; Laparoscopic incisional / umbilical / ventral hernia repair (03/15/2014); Port-a-cath removal (03/15/2014); Hernia repair; Cardiac catheterization (2002); Incisional hernia repair (N/A, 03/15/2014); Port-a-cath removal (Left, 03/15/2014); Insertion of mesh (N/A, 03/15/2014); RIGHT/LEFT HEART CATH AND CORONARY/GRAFT ANGIOGRAPHY (N/A, 05/10/2017); Coronary artery bypass graft; Small intestine surgery; and ICD IMPLANT (N/A, 02/19/2020).   His family history includes Arthritis in his brother; Depression in his brother; Diabetes in his brother, brother, and mother; Heart disease in his brother and brother; Hyperlipidemia in his brother and brother; Hypertension in his brother, brother, and father; Thyroid disease in his sister.He reports that he has quit smoking. His smoking use included cigars. He quit after 40.00 years of use. He has never used smokeless tobacco. He reports that he does not drink alcohol and does not use drugs.    ROS Review of Systems  Constitutional: Negative for fever.  Respiratory: Negative for shortness of breath.   Cardiovascular: Negative for chest pain.  Musculoskeletal: Negative for arthralgias.  Skin: Negative for rash.    Objective:  BP 135/73   Pulse 88   Temp (!) 96.6 F (35.9 C) (Temporal)   Resp 20   Ht '5\' 11"'$  (1.803 m)   Wt 211 lb (95.7 kg)   SpO2 99%   BMI 29.43 kg/m   BP Readings from Last 3 Encounters:  03/10/20 135/73  02/19/20 108/61  02/11/20 136/63    Wt Readings from Last 3 Encounters:  03/10/20 211 lb (95.7 kg)  02/19/20 215 lb (97.5 kg)  02/11/20 214 lb 6.4  oz (97.3 kg)     Physical Exam Vitals reviewed.  Constitutional:      Appearance: He is well-developed.  HENT:     Head: Normocephalic and atraumatic.     Right Ear: External ear normal.     Left Ear: External ear normal.     Mouth/Throat:     Pharynx: No oropharyngeal exudate or  posterior oropharyngeal erythema.  Eyes:     Pupils: Pupils are equal, round, and reactive to light.  Cardiovascular:     Rate and Rhythm: Normal rate and regular rhythm.     Heart sounds: No murmur heard.   Pulmonary:     Effort: No respiratory distress.     Breath sounds: Normal breath sounds.  Musculoskeletal:        General: Swelling (moderate RUE) present.     Cervical back: Normal range of motion and neck supple.  Neurological:     Mental Status: He is alert and oriented to person, place, and time.       Assessment & Plan:   John [name] was seen today for anticoagulation.  Diagnoses and all orders for this visit:  Longstanding persistent atrial fibrillation ([place]) -     CoaguChek XS/INR Waived  Serum potassium elevated -     BMP8+EGFR       I am having John [name] maintain his Osteo Bi-Flex Adv Triple St, aspirin, triamcinolone cream, nitroGLYCERIN, OneTouch Delica Lancets 36O, Dexilant, insulin glargine, metoprolol, sacubitril-valsartan, warfarin, Januvia, OneTouch Verio, metFORMIN, pravastatin, furosemide, glipiZIDE, and sodium polystyrene.  Allergies as of 03/10/2020      Reactions   Doxycycline Hives      Medication List       Accurate as of March 10, 2020 10:18 PM. If you have any questions, ask your nurse or doctor.        aspirin 81 MG chewable tablet Chew 81 mg by mouth daily.   Dexilant 60 MG capsule Generic drug: dexlansoprazole Take 1 capsule (60 mg total) by mouth daily.   furosemide 40 MG tablet Commonly known as: LASIX Take 1 tablet (40 mg total) by mouth daily.   glipiZIDE 10 MG 24 hr tablet Commonly known as: GLUCOTROL XL Take 1 tablet (10 mg total) by mouth daily.   insulin glargine 100 UNIT/ML injection Commonly known as: LANTUS Inject 0.85 mLs (85 Units total) into the skin 2 (two) times daily as needed (takes 85 units every night at bedtime 2 nd dose only if needed for elevated blood sugars). What changed:   how much to  take  when to take this  reasons to take this   Januvia 50 MG tablet Generic drug: sitaGLIPtin TAKE 1 TABLET BY MOUTH EVERY DAY What changed: how much to take   metFORMIN 500 MG tablet Commonly known as: GLUCOPHAGE Take 1 tablet (500 mg total) by mouth 2 (two) times daily with a meal.   metoprolol 200 MG 24 hr tablet Commonly known as: TOPROL-XL Take 1 tablet (200 mg total) by mouth daily after breakfast.   nitroGLYCERIN 0.4 MG SL tablet Commonly known as: NITROSTAT Place 1 tablet (0.4 mg total) under the tongue every 5 (five) minutes as needed for chest pain.   OneTouch Delica Lancets 29U Misc 1 each by Other route 2 (two) times daily. Dx E11.9   OneTouch Verio test strip Generic drug: glucose blood USE TO TEST TWICE A DAY   Osteo Bi-Flex Adv Triple St Tabs Take 1 tablet by mouth 2 (two) times a week.  pravastatin 80 MG tablet Commonly known as: PRAVACHOL TAKE 1 TABLET BY MOUTH DAILY EVERY EVENING What changed:   how much to take  how to take this  when to take this  additional instructions   sacubitril-valsartan 97-103 MG Commonly known as: ENTRESTO Take 1 tablet by mouth 2 (two) times daily.   sodium polystyrene 15 GM/60ML suspension Commonly known as: KAYEXALATE Take 60 MLs once daily for 2 days   triamcinolone cream 0.1 % Commonly known as: KENALOG Apply 1 application topically 2 (two) times daily. Avoid face and genitalia What changed:   when to take this  reasons to take this   warfarin 5 MG tablet Commonly known as: COUMADIN Take as directed by the anticoagulation clinic. If you are unsure how to take this medication, talk to your nurse or doctor. Original instructions: Take 1 tablet (5 mg total) by mouth daily. What changed: when to take this        Follow-up: Return in about 1 month (around 04/10/2020).  Claretta Fraise, M.D.

## 2020-03-11 NOTE — Progress Notes (Signed)
Hello Schawn,  Your lab result is normal and/or stable.Some minor variations that are not significant are commonly marked abnormal, but do not represent any medical problem for you.  Best regards, Dean Wonder, M.D.

## 2020-03-16 ENCOUNTER — Other Ambulatory Visit: Payer: Self-pay | Admitting: Family Medicine

## 2020-03-16 DIAGNOSIS — E785 Hyperlipidemia, unspecified: Secondary | ICD-10-CM

## 2020-04-20 ENCOUNTER — Ambulatory Visit (INDEPENDENT_AMBULATORY_CARE_PROVIDER_SITE_OTHER): Payer: PPO | Admitting: Family Medicine

## 2020-04-20 ENCOUNTER — Other Ambulatory Visit: Payer: Self-pay

## 2020-04-20 ENCOUNTER — Encounter: Payer: Self-pay | Admitting: Family Medicine

## 2020-04-20 VITALS — BP 128/65 | HR 77 | Temp 97.4°F | Ht 71.0 in | Wt 210.8 lb

## 2020-04-20 DIAGNOSIS — E119 Type 2 diabetes mellitus without complications: Secondary | ICD-10-CM

## 2020-04-20 DIAGNOSIS — Z1159 Encounter for screening for other viral diseases: Secondary | ICD-10-CM | POA: Diagnosis not present

## 2020-04-20 DIAGNOSIS — I4811 Longstanding persistent atrial fibrillation: Secondary | ICD-10-CM

## 2020-04-20 DIAGNOSIS — Z794 Long term (current) use of insulin: Secondary | ICD-10-CM | POA: Diagnosis not present

## 2020-04-20 DIAGNOSIS — E875 Hyperkalemia: Secondary | ICD-10-CM

## 2020-04-20 DIAGNOSIS — E785 Hyperlipidemia, unspecified: Secondary | ICD-10-CM | POA: Diagnosis not present

## 2020-04-20 LAB — COAGUCHEK XS/INR WAIVED
INR: 3.5 — ABNORMAL HIGH (ref 0.9–1.1)
Prothrombin Time: 41.8 s

## 2020-04-21 LAB — CMP14+EGFR
ALT: 12 IU/L (ref 0–44)
AST: 18 IU/L (ref 0–40)
Albumin/Globulin Ratio: 1.8 (ref 1.2–2.2)
Albumin: 4.1 g/dL (ref 3.8–4.8)
Alkaline Phosphatase: 114 IU/L (ref 48–121)
BUN/Creatinine Ratio: 40 — ABNORMAL HIGH (ref 10–24)
BUN: 56 mg/dL — ABNORMAL HIGH (ref 8–27)
Bilirubin Total: 0.2 mg/dL (ref 0.0–1.2)
CO2: 22 mmol/L (ref 20–29)
Calcium: 8.9 mg/dL (ref 8.6–10.2)
Chloride: 107 mmol/L — ABNORMAL HIGH (ref 96–106)
Creatinine, Ser: 1.41 mg/dL — ABNORMAL HIGH (ref 0.76–1.27)
GFR calc Af Amer: 59 mL/min/{1.73_m2} — ABNORMAL LOW (ref >59)
GFR calc non Af Amer: 51 mL/min/{1.73_m2} — ABNORMAL LOW (ref >59)
Globulin, Total: 2.3 g/dL (ref 1.5–4.5)
Glucose: 111 mg/dL — ABNORMAL HIGH (ref 65–99)
Potassium: 5.4 mmol/L — ABNORMAL HIGH (ref 3.5–5.2)
Sodium: 141 mmol/L (ref 134–144)
Total Protein: 6.4 g/dL (ref 6.0–8.5)

## 2020-04-21 LAB — CBC WITH DIFFERENTIAL/PLATELET
Basophils Absolute: 0.1 10*3/uL (ref 0.0–0.2)
Basos: 1 %
EOS (ABSOLUTE): 0.2 10*3/uL (ref 0.0–0.4)
Eos: 3 %
Hematocrit: 41.8 % (ref 37.5–51.0)
Hemoglobin: 13.5 g/dL (ref 13.0–17.7)
Immature Grans (Abs): 0 10*3/uL (ref 0.0–0.1)
Immature Granulocytes: 1 %
Lymphocytes Absolute: 1.1 10*3/uL (ref 0.7–3.1)
Lymphs: 18 %
MCH: 29 pg (ref 26.6–33.0)
MCHC: 32.3 g/dL (ref 31.5–35.7)
MCV: 90 fL (ref 79–97)
Monocytes Absolute: 0.6 10*3/uL (ref 0.1–0.9)
Monocytes: 11 %
Neutrophils Absolute: 4 10*3/uL (ref 1.4–7.0)
Neutrophils: 66 %
Platelets: 177 10*3/uL (ref 150–450)
RBC: 4.66 x10E6/uL (ref 4.14–5.80)
RDW: 14.6 % (ref 11.6–15.4)
WBC: 5.9 10*3/uL (ref 3.4–10.8)

## 2020-04-21 LAB — HEPATITIS C ANTIBODY: Hep C Virus Ab: <0.1 s/co ratio (ref 0.0–0.9)

## 2020-04-30 ENCOUNTER — Encounter: Payer: Self-pay | Admitting: Family Medicine

## 2020-04-30 NOTE — Progress Notes (Signed)
Subjective:  Patient ID: John [name], male    DOB: [birth year]  Age: 69 y.o. MRN: [id number]  CC: Follow-up   HPI John [name] presents for Atrial fibrillation follow up. Pt. is treated with rate control and anticoagulation. Pt.  denies palpitations, rapid rate, chest pain, dyspnea and edema. There has been no bleeding from nose or gums. Pt. has not noticed blood with urine or stool.  Although there is routine bruising easily, it is not excessive.  presents forFollow-up of diabetes. Patient denies symptoms such as polyuria, polydipsia, excessive hunger, nausea No significant hypoglycemic spells noted. Medications reviewed. Pt reports taking them regularly without complication/adverse reaction being reported today.  Checking feet daily.   in for follow-up of elevated cholesterol. Doing well without complaints on current medication. Denies side effects of statin including myalgia and arthralgia and nausea. Currently no chest pain, shortness of breath or other cardiovascular related symptoms noted.      Depression screen [facility] 2/9 04/20/2020 03/10/2020 02/11/2020  Decreased Interest 0 0 0  Down, Depressed, Hopeless 0 0 0  PHQ - 2 Score 0 0 0  Altered sleeping - - -  Tired, decreased energy - - -  Change in appetite - - -  Feeling bad or failure about yourself  - - -  Trouble concentrating - - -  Moving slowly or fidgety/restless - - -  Suicidal thoughts - - -  PHQ-9 Score - - -  Some recent data might be hidden    History John [name] has a past medical history of Allergy, Anemia, Arthritis, Atrial fibrillation ([place]), Bruises easily, CAD (coronary artery disease), CHF (congestive heart failure) ([place]), Constipation, Enlarged prostate, Flu (09/2013), GERD (gastroesophageal reflux disease), Glaucoma, History of blood transfusion, History of colon polyps, HLD (hyperlipidemia), HTN (hypertension), Insomnia, Kidney stones, Nocturia, Peripheral edema, Peripheral neuropathy, Rheumatic fever (~  1965), Rheumatic heart disease, S/P MVR (mitral valve replacement) (11/2000), 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   He has a past surgical history that includes Mitral valve replacement (11/2000); laparotomy (N/A, 02/09/2013); Portacath placement (N/A, 02/09/2013); Coronary artery bypass graft (2002); Septoplasty; Application if wound vac (2014); Colonoscopy; Esophagogastroduodenoscopy; Laparoscopic incisional / umbilical / ventral hernia repair (03/15/2014); Port-a-cath removal (03/15/2014); Hernia repair; Cardiac catheterization (2002); Incisional hernia repair (N/A, 03/15/2014); Port-a-cath removal (Left, 03/15/2014); Insertion of mesh (N/A, 03/15/2014); RIGHT/LEFT HEART CATH AND CORONARY/GRAFT ANGIOGRAPHY (N/A, 05/10/2017); Coronary artery bypass graft; Small intestine surgery; and ICD IMPLANT (N/A, 02/19/2020).   His family history includes Arthritis in his brother; Depression in his brother; Diabetes in his brother, brother, and mother; Heart disease in his brother and brother; Hyperlipidemia in his brother and brother; Hypertension in his brother, brother, and father; Thyroid disease in his sister.He reports that he has quit smoking. His smoking use included cigars. He quit after 40.00 years of use. He has never used smokeless tobacco. He reports that he does not drink alcohol and does not use drugs.    ROS Review of Systems  Constitutional: Negative for fever.  Respiratory: Negative for shortness of breath.   Cardiovascular: Negative for chest pain.  Musculoskeletal: Negative for arthralgias.  Skin: Negative for rash.    Objective:  BP 128/65   Pulse 77   Temp (!) 97.4 F (36.3 C) (Temporal)   Ht _0  (1.803 m)   Wt 210 lb 12.8 oz (95.6 kg)   BMI 29.40 kg/m   BP Readings from Last 3 Encounters:  04/20/20 128/65  03/10/20 135/73  02/19/20 108/61    Wt Readings from Last 3 Encounters:  04/20/20 210 lb 12.8 oz (95.6 kg)  03/10/20  211 lb (95.7 kg)  02/19/20 215 lb (97.5 kg)     Physical Exam Vitals reviewed.  Constitutional:      Appearance: He is well-developed.  HENT:     Head: Normocephalic and atraumatic.     Right Ear: Tympanic membrane and external ear normal. No decreased hearing noted.     Left Ear: Tympanic membrane and external ear normal. No decreased hearing noted.     Mouth/Throat:     Pharynx: No oropharyngeal exudate or posterior oropharyngeal erythema.  Eyes:     Pupils: Pupils are equal, round, and reactive to light.  Cardiovascular:     Rate and Rhythm: Normal rate and regular rhythm.     Heart sounds: No murmur heard.   Pulmonary:     Effort: No respiratory distress.     Breath sounds: Normal breath sounds.  Abdominal:     General: Bowel sounds are normal.     Palpations: Abdomen is soft. There is no mass.     Tenderness: There is no abdominal tenderness.  Musculoskeletal:     Cervical back: Normal range of motion and neck supple.    Results for orders placed or performed in visit on 04/20/20  CBC with Differential/Platelet  Result Value Ref Range   WBC 5.9 3.4 - 10.8 x10E3/uL   RBC 4.66 4.14 - 5.80 x10E6/uL   Hemoglobin 13.5 13.0 - 17.7 g/dL   Hematocrit 41.8 37.5 - 51.0 %   MCV 90 79 - 97 fL   MCH 29.0 26.6 - 33.0 pg   MCHC 32.3 31 - 35 g/dL   RDW 14.6 11.6 - 15.4 %   Platelets 177 150 - 450 x10E3/uL   Neutrophils 66 Not Estab. %   Lymphs 18 Not Estab. %   Monocytes 11 Not Estab. %   Eos 3 Not Estab. %   Basos 1 Not Estab. %   Neutrophils Absolute 4.0 1 - 7 x10E3/uL   Lymphocytes Absolute 1.1 0 - 3 x10E3/uL   Monocytes Absolute 0.6 0 - 0 x10E3/uL   EOS (ABSOLUTE) 0.2 0.0 - 0.4 x10E3/uL   Basophils Absolute 0.1 0 - 0 x10E3/uL   Immature Granulocytes 1 Not Estab. %   Immature Grans (Abs) 0.0 0.0 - 0.1 x10E3/uL  CMP14+EGFR  Result Value Ref Range   Glucose 111 (H) 65 - 99 mg/dL   BUN 56 (H) 8 - 27 mg/dL   Creatinine, Ser 1.41 (H) 0.76 - 1.27 mg/dL   GFR calc non Af  Amer 51 (L) >59 mL/min/1.73   GFR calc Af Amer 59 (L) >59 mL/min/1.73   BUN/Creatinine Ratio 40 (H) 10 - 24   Sodium 141 134 - 144 mmol/L   Potassium 5.4 (H) 3.5 - 5.2 mmol/L   Chloride 107 (H) 96 - 106 mmol/L   CO2 22 20 - 29 mmol/L   Calcium 8.9 8.6 - 10.2 mg/dL   Total Protein 6.4 6.0 - 8.5 g/dL   Albumin 4.1 3.8 - 4.8 g/dL   Globulin, Total 2.3 1.5 - 4.5 g/dL   Albumin/Globulin Ratio 1.8 1.2 - 2.2   Bilirubin Total 0.2 0.0 - 1.2 mg/dL   Alkaline Phosphatase 114 48 - 121 IU/L   AST 18 0 - 40 IU/L   ALT 12 0 - 44 IU/L  CoaguChek XS/INR Waived  Result Value Ref Range   INR 3.5 (  H) 0.9 - 1.1   Prothrombin Time 41.8 sec  Hepatitis C antibody  Result Value Ref Range   Hep C Virus Ab <0.1 0.0 - 0.9 s/co ratio       Assessment & Plan:   John [name] was seen today for follow-up.  Diagnoses and all orders for this visit:  Longstanding persistent atrial fibrillation ([place]) -     CBC with Differential/Platelet -     CMP14+EGFR -     CoaguChek XS/INR Waived  Type 2 diabetes mellitus without complication, with long-term current use of insulin ([facility]) -     CBC with Differential/Platelet -     CMP14+EGFR -     Cancel: Bayer DCA Hb A1c Waived  Hyperlipidemia, unspecified hyperlipidemia type -     CBC with Differential/Platelet -     CMP14+EGFR -     Cancel: Lipid panel  Hyperkalemia -     CBC with Differential/Platelet -     CMP14+EGFR  Need for hepatitis C screening test -     Hepatitis C antibody       I am having John [name] maintain his Osteo Bi-Flex Adv Triple St, aspirin, triamcinolone cream, nitroGLYCERIN, OneTouch Delica Lancets 62B, Dexilant, insulin glargine, metoprolol, sacubitril-valsartan, warfarin, Januvia, OneTouch Verio, metFORMIN, furosemide, glipiZIDE, sodium polystyrene, and pravastatin.  Allergies as of 04/20/2020      Reactions   Doxycycline Hives      Medication List       Accurate as of April 20, 2020 11:59 PM. If you have any questions,  ask your nurse or doctor.        aspirin 81 MG chewable tablet Chew 81 mg by mouth daily.   Dexilant 60 MG capsule Generic drug: dexlansoprazole Take 1 capsule (60 mg total) by mouth daily.   furosemide 40 MG tablet Commonly known as: LASIX Take 1 tablet (40 mg total) by mouth daily.   glipiZIDE 10 MG 24 hr tablet Commonly known as: GLUCOTROL XL Take 1 tablet (10 mg total) by mouth daily.   insulin glargine 100 UNIT/ML injection Commonly known as: LANTUS Inject 0.85 mLs (85 Units total) into the skin 2 (two) times daily as needed (takes 85 units every night at bedtime 2 nd dose only if needed for elevated blood sugars). What changed:   how much to take  when to take this  reasons to take this   Januvia 50 MG tablet Generic drug: sitaGLIPtin TAKE 1 TABLET BY MOUTH EVERY DAY What changed: how much to take   metFORMIN 500 MG tablet Commonly known as: GLUCOPHAGE Take 1 tablet (500 mg total) by mouth 2 (two) times daily with a meal.   metoprolol 200 MG 24 hr tablet Commonly known as: TOPROL-XL Take 1 tablet (200 mg total) by mouth daily after breakfast.   nitroGLYCERIN 0.4 MG SL tablet Commonly known as: NITROSTAT Place 1 tablet (0.4 mg total) under the tongue every 5 (five) minutes as needed for chest pain.   OneTouch Delica Lancets 76E Misc 1 each by Other route 2 (two) times daily. Dx E11.9   OneTouch Verio test strip Generic drug: glucose blood USE TO TEST TWICE A DAY   Osteo Bi-Flex Adv Triple St Tabs Take 1 tablet by mouth 2 (two) times a week.   pravastatin 80 MG tablet Commonly known as: PRAVACHOL TAKE 1 TABLET BY MOUTH DAILY EVERY EVENING   sacubitril-valsartan 97-103 MG Commonly known as: ENTRESTO Take 1 tablet by mouth 2 (two) times daily.   sodium polystyrene  15 GM/60ML suspension Commonly known as: KAYEXALATE Take 60 MLs once daily for 2 days   triamcinolone cream 0.1 % Commonly known as: KENALOG Apply 1 application topically 2 (two)  times daily. Avoid face and genitalia What changed:   when to take this  reasons to take this   warfarin 5 MG tablet Commonly known as: COUMADIN Take as directed by the anticoagulation clinic. If you are unsure how to take this medication, talk to your nurse or doctor. Original instructions: Take 1 tablet (5 mg total) by mouth daily. What changed: when to take this        Follow-up: No follow-ups on file.  Claretta Fraise, M.D.

## 2020-05-13 ENCOUNTER — Other Ambulatory Visit: Payer: Self-pay | Admitting: Family Medicine

## 2020-05-13 DIAGNOSIS — I255 Ischemic cardiomyopathy: Secondary | ICD-10-CM

## 2020-05-16 ENCOUNTER — Other Ambulatory Visit: Payer: Self-pay | Admitting: Family Medicine

## 2020-05-16 DIAGNOSIS — I10 Essential (primary) hypertension: Secondary | ICD-10-CM

## 2020-05-16 DIAGNOSIS — I25118 Atherosclerotic heart disease of native coronary artery with other forms of angina pectoris: Secondary | ICD-10-CM

## 2020-05-16 DIAGNOSIS — I255 Ischemic cardiomyopathy: Secondary | ICD-10-CM

## 2020-05-19 ENCOUNTER — Encounter: Payer: Self-pay | Admitting: Family Medicine

## 2020-05-19 ENCOUNTER — Ambulatory Visit (INDEPENDENT_AMBULATORY_CARE_PROVIDER_SITE_OTHER): Payer: PPO | Admitting: Family Medicine

## 2020-05-19 ENCOUNTER — Other Ambulatory Visit: Payer: Self-pay

## 2020-05-19 VITALS — BP 135/72 | HR 77 | Temp 97.2°F | Ht 71.0 in | Wt 217.8 lb

## 2020-05-19 DIAGNOSIS — Z7901 Long term (current) use of anticoagulants: Secondary | ICD-10-CM

## 2020-05-19 DIAGNOSIS — I255 Ischemic cardiomyopathy: Secondary | ICD-10-CM

## 2020-05-19 DIAGNOSIS — I10 Essential (primary) hypertension: Secondary | ICD-10-CM | POA: Diagnosis not present

## 2020-05-19 DIAGNOSIS — Z794 Long term (current) use of insulin: Secondary | ICD-10-CM

## 2020-05-19 DIAGNOSIS — E785 Hyperlipidemia, unspecified: Secondary | ICD-10-CM

## 2020-05-19 DIAGNOSIS — I4811 Longstanding persistent atrial fibrillation: Secondary | ICD-10-CM | POA: Diagnosis not present

## 2020-05-19 DIAGNOSIS — E119 Type 2 diabetes mellitus without complications: Secondary | ICD-10-CM | POA: Diagnosis not present

## 2020-05-19 DIAGNOSIS — Z9889 Other specified postprocedural states: Secondary | ICD-10-CM

## 2020-05-19 DIAGNOSIS — I25118 Atherosclerotic heart disease of native coronary artery with other forms of angina pectoris: Secondary | ICD-10-CM

## 2020-05-19 LAB — COAGUCHEK XS/INR WAIVED
INR: 3.7 — ABNORMAL HIGH (ref 0.9–1.1)
Prothrombin Time: 44.6 s

## 2020-05-19 LAB — BAYER DCA HB A1C WAIVED: HB A1C (BAYER DCA - WAIVED): 7 % — ABNORMAL HIGH

## 2020-05-19 MED ORDER — GLIPIZIDE ER 10 MG PO TB24: 10.0000 mg | ORAL_TABLET | Freq: Every day | ORAL | 0 refills | Status: DC

## 2020-05-19 MED ORDER — METOPROLOL SUCCINATE ER 200 MG PO TB24: 200.0000 mg | ORAL_TABLET | Freq: Every day | ORAL | 0 refills | Status: DC

## 2020-05-19 MED ORDER — PRAVASTATIN SODIUM 80 MG PO TABS: 80.0000 mg | ORAL_TABLET | Freq: Every day | ORAL | 3 refills | Status: DC

## 2020-05-19 MED ORDER — METFORMIN HCL 500 MG PO TABS: 500.0000 mg | ORAL_TABLET | Freq: Two times a day (BID) | ORAL | 1 refills | Status: DC

## 2020-05-19 MED ORDER — WARFARIN SODIUM 5 MG PO TABS: 5.0000 mg | ORAL_TABLET | Freq: Every day | ORAL | 1 refills | Status: DC

## 2020-05-19 NOTE — Progress Notes (Signed)
Subjective:  Patient ID: John [name],  male    DOB: 03-May-1951  Age: 69 y.o.    CC: Follow-up   HPI Archie L Rabago presents for  follow-up of hypertension. Patient has no history of headache chest pain or shortness of breath or recent cough. Patient also denies symptoms of TIA such as numbness weakness lateralizing. Patient denies side effects from medication. States taking it regularly.  Patient also  in for follow-up of elevated cholesterol. Doing well without complaints on current medication. Denies side effects  including myalgia and arthralgia and nausea. Also in today for liver function testing. Currently no chest pain, shortness of breath or other cardiovascular related symptoms noted.  Follow-up of diabetes. Patient does check blood sugar at home. Readings run between less than 120 fasting and 150 postprandial Patient denies symptoms such as excessive hunger or urinary frequency, excessive hunger, nausea No significant hypoglycemic spells noted.  However occasionally if his blood sugar in the evenings is around 100 or so he will skip his Lantus.  He is now taking 70 units every evening. Medications reviewed. Pt reports taking them regularly. Pt. denies complication/adverse reaction today.   Atrial fibrillation follow up. Pt. is treated with rate control and anticoagulation. Pt.  denies palpitations, rapid rate, chest pain, dyspnea and edema. There has been no bleeding from nose or gums. Pt. has not noticed blood with urine or stool.  Although there is routine bruising easily, it is not excessive.  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   He has a past surgical history that includes Mitral valve replacement (11/2000); laparotomy (N/A, 02/09/2013); Portacath placement (N/A, 02/09/2013); Coronary artery bypass graft (2002); Septoplasty; Application if wound vac (2014); Colonoscopy; Esophagogastroduodenoscopy; Laparoscopic incisional / umbilical / ventral hernia repair (03/15/2014); Port-a-cath removal (03/15/2014); Hernia repair; Cardiac catheterization (2002); Incisional hernia repair (N/A, 03/15/2014); Port-a-cath removal (Left, 03/15/2014); Insertion of mesh (N/A, 03/15/2014); RIGHT/LEFT HEART CATH AND CORONARY/GRAFT ANGIOGRAPHY (N/A, 05/10/2017); Coronary artery bypass graft; Small intestine surgery; and ICD IMPLANT (N/A, 02/19/2020).   His family history includes Arthritis in his brother; Depression in his brother; Diabetes in his brother, brother, and mother; Heart disease in his brother and brother; Hyperlipidemia in his brother and brother; Hypertension in his brother, brother, and father; Thyroid disease in his sister.He reports that he has quit smoking. His smoking use included cigars. He quit after 40.00 years of use. He has never used smokeless tobacco. He reports that he does not drink alcohol and does not use drugs.  Current Outpatient Medications on File Prior to Visit  Medication Sig Dispense Refill  . aspirin 81 MG chewable tablet Chew 81 mg by mouth daily.    Marland Kitchen dexlansoprazole (DEXILANT) 60 MG capsule Take 1 capsule (60 mg total) by mouth daily. 90 capsule 1  . furosemide (LASIX) 40 MG tablet TAKE 1 TABLET BY MOUTH EVERY DAY 90 tablet 0  . glucose blood (ONETOUCH VERIO) test strip USE TO TEST TWICE A DAY 200 strip 3  . insulin glargine (LANTUS) 100 UNIT/ML injection Inject  0.85 mLs (  85 Units total) into the skin 2 (two) times daily as needed (takes 85 units every night at bedtime 2 nd dose only if needed for elevated blood sugars). (Patient taking differently: Inject 75 Units into the skin at bedtime as needed (blood sugar around 200 or higher). ) 10 mL 5  . JANUVIA 50 MG tablet TAKE 1 TABLET BY MOUTH EVERY DAY (Patient taking differently: Take 50 mg by mouth daily. ) 90 tablet 0  . Misc Natural Products (OSTEO BI-FLEX ADV TRIPLE ST) TABS Take 1 tablet by mouth 2 (two) times a week.     . nitroGLYCERIN (NITROSTAT) 0.4 MG SL tablet Place 1 tablet (0.4 mg total) under the tongue every 5 (five) minutes as needed for chest pain. 50 tablet 10  . OneTouch Delica Lancets 30G MISC 1 each by Other route 2 (two) times daily. Dx E11.9 200 each 3  . sacubitril-valsartan (ENTRESTO) 97-103 MG Take 1 tablet by mouth 2 (two) times daily. 180 tablet 2  . sodium polystyrene (KAYEXALATE) 15 GM/60ML suspension Take 60 MLs once daily for 2 days 120 mL 0  . triamcinolone cream (KENALOG) 0.1 % Apply 1 application topically 2 (two) times daily. Avoid face and genitalia (Patient taking differently: Apply 1 application topically 2 (two) times daily as needed (redness). Avoid face and genitalia) 45 g 2   No current facility-administered medications on file prior to visit.    ROS Review of Systems  Constitutional: Negative.   HENT: Negative.   Eyes: Negative for visual disturbance.  Respiratory: Negative for cough and shortness of breath.   Cardiovascular: Negative for chest pain and leg swelling.  Gastrointestinal: Negative for abdominal pain, diarrhea, nausea and vomiting.  Genitourinary: Negative for difficulty urinating.  Musculoskeletal: Negative for arthralgias and myalgias.  Skin: Negative for rash.  Neurological: Negative for headaches.  Psychiatric/Behavioral: Negative for sleep disturbance.    Objective:  BP 135/72   Pulse 77   Temp (!) 97.2 F (36.2 C) (Temporal)   Ht  5\' 11"  (1.803 m)   Wt 217 lb 12.8 oz (98.8 kg)   BMI 30.38 kg/m   BP Readings from Last 3 Encounters:  05/19/20 135/72  04/20/20 128/65  03/10/20 135/73    Wt Readings from Last 3 Encounters:  05/19/20 217 lb 12.8 oz (98.8 kg)  04/20/20 210 lb 12.8 oz (95.6 kg)  03/10/20 211 lb (95.7 kg)     Physical Exam Constitutional:      General: He is not in acute distress.    Appearance: He is well-developed.  HENT:     Head: Normocephalic and atraumatic.     Right Ear: External ear normal.     Left Ear: External ear normal.     Nose: Nose normal.  Eyes:     Conjunctiva/sclera: Conjunctivae normal.     Pupils: Pupils are equal, round, and reactive to light.  Cardiovascular:     Rate and Rhythm: Normal rate and regular rhythm.     Heart sounds: Normal heart sounds. No murmur heard.   Pulmonary:     Effort: Pulmonary effort is normal. No respiratory distress.     Breath sounds: Normal breath sounds. No wheezing or rales.  Abdominal:     Palpations: Abdomen is soft.     Tenderness: There is no abdominal tenderness.  Musculoskeletal:        General: Normal range of motion.     Cervical back: Normal range of motion and neck supple.  Skin:    General: Skin  is warm and dry.  Neurological:     Mental Status: He is alert and oriented to person, place, and time.     Deep Tendon Reflexes: Reflexes are normal and symmetric.  Psychiatric:        Behavior: Behavior normal.        Thought Content: Thought content normal.        Judgment: Judgment normal.     Diabetic Foot Exam - Simple   Simple Foot Form Diabetic Foot exam was performed with the following findings: Yes 05/19/2020  8:49 AM  Visual Inspection No deformities, no ulcerations, no other skin breakdown bilaterally: Yes Sensation Testing Intact to touch and monofilament testing bilaterally: Yes Pulse Check Posterior Tibialis and Dorsalis pulse intact bilaterally: Yes Comments       Assessment & Plan:   Mattix was  seen today for follow-up.  Diagnoses and all orders for this visit:  Type 2 diabetes mellitus without complication, with long-term current use of insulin (HCC) -     CBC with Differential/Platelet -     CMP14+EGFR -     Lipid panel -     glipiZIDE (GLUCOTROL XL) 10 MG 24 hr tablet; Take 1 tablet (10 mg total) by mouth daily. -     metFORMIN (GLUCOPHAGE) 500 MG tablet; Take 1 tablet (500 mg total) by mouth 2 (two) times daily with a meal.  Longstanding persistent atrial fibrillation (HCC) -     CBC with Differential/Platelet -     CMP14+EGFR -     Lipid panel -     CoaguChek XS/INR Waived -     warfarin (COUMADIN) 5 MG tablet; Take 1 tablet (5 mg total) by mouth daily.  Hyperlipidemia, unspecified hyperlipidemia type -     CBC with Differential/Platelet -     CMP14+EGFR -     Lipid panel -     Bayer DCA Hb A1c Waived -     pravastatin (PRAVACHOL) 80 MG tablet; Take 1 tablet (80 mg total) by mouth daily.  MITRAL VALVE REPLACEMENT, HX OF -     warfarin (COUMADIN) 5 MG tablet; Take 1 tablet (5 mg total) by mouth daily.  Long term (current) use of anticoagulants -     warfarin (COUMADIN) 5 MG tablet; Take 1 tablet (5 mg total) by mouth daily.  Essential hypertension -     metoprolol (TOPROL-XL) 200 MG 24 hr tablet; Take 1 tablet (200 mg total) by mouth daily after breakfast.  Ischemic cardiomyopathy -     metoprolol (TOPROL-XL) 200 MG 24 hr tablet; Take 1 tablet (200 mg total) by mouth daily after breakfast.  Coronary artery disease of native artery of native heart with stable angina pectoris (HCC) -     metoprolol (TOPROL-XL) 200 MG 24 hr tablet; Take 1 tablet (200 mg total) by mouth daily after breakfast.   I have changed Khalon L. Girgis's pravastatin. I am also having him maintain his Osteo Bi-Flex Adv Triple St, aspirin, triamcinolone cream, nitroGLYCERIN, OneTouch Delica Lancets 31D, Dexilant, insulin glargine, sacubitril-valsartan, Januvia, OneTouch Verio, sodium  polystyrene, furosemide, warfarin, glipiZIDE, metoprolol, and metFORMIN.  Meds ordered this encounter  Medications  . warfarin (COUMADIN) 5 MG tablet    Sig: Take 1 tablet (5 mg total) by mouth daily.    Dispense:  90 tablet    Refill:  1  . glipiZIDE (GLUCOTROL XL) 10 MG 24 hr tablet    Sig: Take 1 tablet (10 mg total) by mouth daily.    Dispense:  90 tablet    Refill:  0    RX IS OUT OF REFILLS  . metoprolol (TOPROL-XL) 200 MG 24 hr tablet    Sig: Take 1 tablet (200 mg total) by mouth daily after breakfast.    Dispense:  90 tablet    Refill:  0  . pravastatin (PRAVACHOL) 80 MG tablet    Sig: Take 1 tablet (80 mg total) by mouth daily.    Dispense:  90 tablet    Refill:  3  . metFORMIN (GLUCOPHAGE) 500 MG tablet    Sig: Take 1 tablet (500 mg total) by mouth 2 (two) times daily with a meal.    Dispense:  180 tablet    Refill:  1   I asked the patient to have a lab only visit in 10 days for an INR.  If it does not drop some then may need to decrease his warfarin.  In the meantime I just asked him to watch out for signs of being too thin from bleeding in the gums nose urine or stool.  He was cautioned against doing activities that would put him at risk for fall.  Follow-up: Return in about 1 month (around 06/18/2020).  Claretta Fraise, M.D.

## 2020-05-20 ENCOUNTER — Ambulatory Visit (INDEPENDENT_AMBULATORY_CARE_PROVIDER_SITE_OTHER): Payer: PPO | Admitting: *Deleted

## 2020-05-20 DIAGNOSIS — I255 Ischemic cardiomyopathy: Secondary | ICD-10-CM

## 2020-05-20 LAB — LIPID PANEL
Chol/HDL Ratio: 4.8 ratio (ref 0.0–5.0)
Cholesterol, Total: 130 mg/dL (ref 100–199)
HDL: 27 mg/dL — ABNORMAL LOW (ref >39)
LDL Chol Calc (NIH): 82 mg/dL (ref 0–99)
Triglycerides: 116 mg/dL (ref 0–149)
VLDL Cholesterol Cal: 21 mg/dL (ref 5–40)

## 2020-05-20 LAB — CUP PACEART REMOTE DEVICE CHECK
Battery Remaining Longevity: 100 mo
Battery Remaining Percentage: 95 %
Battery Voltage: 3.04 V
Brady Statistic RV Percent Paced: 1.8 %
Date Time Interrogation Session: 20210910020146
HighPow Impedance: 68 Ohm
Implantable Lead Implant Date: 20210611
Implantable Lead Location: 753860
Implantable Pulse Generator Implant Date: 20210611
Lead Channel Impedance Value: 590 Ohm
Lead Channel Pacing Threshold Amplitude: 0.75 V
Lead Channel Pacing Threshold Pulse Width: 0.5 ms
Lead Channel Sensing Intrinsic Amplitude: 12 mV
Lead Channel Setting Pacing Amplitude: 3.5 V
Lead Channel Setting Pacing Pulse Width: 0.5 ms
Lead Channel Setting Sensing Sensitivity: 0.5 mV
Pulse Gen Serial Number: 810002163

## 2020-05-20 LAB — CBC WITH DIFFERENTIAL/PLATELET
Basophils Absolute: 0.1 10*3/uL (ref 0.0–0.2)
Basos: 1 %
EOS (ABSOLUTE): 0.1 10*3/uL (ref 0.0–0.4)
Eos: 2 %
Hematocrit: 41.2 % (ref 37.5–51.0)
Hemoglobin: 13.5 g/dL (ref 13.0–17.7)
Immature Grans (Abs): 0.1 10*3/uL (ref 0.0–0.1)
Immature Granulocytes: 1 %
Lymphocytes Absolute: 0.9 10*3/uL (ref 0.7–3.1)
Lymphs: 15 %
MCH: 29.7 pg (ref 26.6–33.0)
MCHC: 32.8 g/dL (ref 31.5–35.7)
MCV: 91 fL (ref 79–97)
Monocytes Absolute: 0.6 10*3/uL (ref 0.1–0.9)
Monocytes: 11 %
Neutrophils Absolute: 4.1 10*3/uL (ref 1.4–7.0)
Neutrophils: 70 %
Platelets: 171 10*3/uL (ref 150–450)
RBC: 4.55 x10E6/uL (ref 4.14–5.80)
RDW: 14.5 % (ref 11.6–15.4)
WBC: 5.9 10*3/uL (ref 3.4–10.8)

## 2020-05-20 LAB — CMP14+EGFR
ALT: 14 IU/L (ref 0–44)
AST: 18 IU/L (ref 0–40)
Albumin/Globulin Ratio: 1.6 (ref 1.2–2.2)
Albumin: 4.2 g/dL (ref 3.8–4.8)
Alkaline Phosphatase: 125 IU/L — ABNORMAL HIGH (ref 48–121)
BUN/Creatinine Ratio: 29 — ABNORMAL HIGH (ref 10–24)
BUN: 38 mg/dL — ABNORMAL HIGH (ref 8–27)
Bilirubin Total: 0.5 mg/dL (ref 0.0–1.2)
CO2: 20 mmol/L (ref 20–29)
Calcium: 9.4 mg/dL (ref 8.6–10.2)
Chloride: 107 mmol/L — ABNORMAL HIGH (ref 96–106)
Creatinine, Ser: 1.33 mg/dL — ABNORMAL HIGH (ref 0.76–1.27)
GFR calc Af Amer: 63 mL/min/{1.73_m2} (ref >59)
GFR calc non Af Amer: 55 mL/min/{1.73_m2} — ABNORMAL LOW (ref >59)
Globulin, Total: 2.6 g/dL (ref 1.5–4.5)
Glucose: 154 mg/dL — ABNORMAL HIGH (ref 65–99)
Potassium: 5.6 mmol/L — ABNORMAL HIGH (ref 3.5–5.2)
Sodium: 142 mmol/L (ref 134–144)
Total Protein: 6.8 g/dL (ref 6.0–8.5)

## 2020-05-20 NOTE — Progress Notes (Signed)
Remote ICD transmission.   

## 2020-05-24 ENCOUNTER — Other Ambulatory Visit: Payer: Self-pay

## 2020-05-24 ENCOUNTER — Other Ambulatory Visit: Payer: PPO

## 2020-05-24 DIAGNOSIS — E875 Hyperkalemia: Secondary | ICD-10-CM | POA: Diagnosis not present

## 2020-05-25 ENCOUNTER — Encounter: Payer: Self-pay | Admitting: Internal Medicine

## 2020-05-25 ENCOUNTER — Other Ambulatory Visit: Payer: Self-pay

## 2020-05-25 ENCOUNTER — Ambulatory Visit: Payer: PPO | Admitting: Internal Medicine

## 2020-05-25 VITALS — BP 116/66 | HR 77 | Ht 71.0 in | Wt 216.0 lb

## 2020-05-25 DIAGNOSIS — Z9889 Other specified postprocedural states: Secondary | ICD-10-CM

## 2020-05-25 DIAGNOSIS — Z95810 Presence of automatic (implantable) cardiac defibrillator: Secondary | ICD-10-CM

## 2020-05-25 DIAGNOSIS — I255 Ischemic cardiomyopathy: Secondary | ICD-10-CM

## 2020-05-25 DIAGNOSIS — I482 Chronic atrial fibrillation, unspecified: Secondary | ICD-10-CM

## 2020-05-25 DIAGNOSIS — I5022 Chronic systolic (congestive) heart failure: Secondary | ICD-10-CM

## 2020-05-25 LAB — BASIC METABOLIC PANEL
BUN/Creatinine Ratio: 29 — ABNORMAL HIGH (ref 10–24)
BUN: 43 mg/dL — ABNORMAL HIGH (ref 8–27)
CO2: 19 mmol/L — ABNORMAL LOW (ref 20–29)
Calcium: 8.8 mg/dL (ref 8.6–10.2)
Chloride: 108 mmol/L — ABNORMAL HIGH (ref 96–106)
Creatinine, Ser: 1.48 mg/dL — ABNORMAL HIGH (ref 0.76–1.27)
GFR calc Af Amer: 56 mL/min/{1.73_m2} — ABNORMAL LOW (ref >59)
GFR calc non Af Amer: 48 mL/min/{1.73_m2} — ABNORMAL LOW (ref >59)
Glucose: 78 mg/dL (ref 65–99)
Potassium: 5.3 mmol/L — ABNORMAL HIGH (ref 3.5–5.2)
Sodium: 144 mmol/L (ref 134–144)

## 2020-05-25 NOTE — Progress Notes (Signed)
HPI John [name] returns today for ongoing evaluation and management. He is a pleasant 69 yo man with a h/o CAD, s/p CABG, s/p MVR remotely who also had chronic atrial fib. He underwent ICD insertion 3 months ago. In the interim, he notes no chest pain or sob. No ICD therapy. He notes that his right arm was swollen after his implant, but has resolved. Allergies  Allergen Reactions  . Doxycycline Hives     Current Outpatient Medications  Medication Sig Dispense Refill  . aspirin 81 MG chewable tablet Chew 81 mg by mouth daily.    Marland Kitchen dexlansoprazole (DEXILANT) 60 MG capsule Take 1 capsule (60 mg total) by mouth daily. 90 capsule 1  . furosemide (LASIX) 40 MG tablet TAKE 1 TABLET BY MOUTH EVERY DAY 90 tablet 0  . glipiZIDE (GLUCOTROL XL) 10 MG 24 hr tablet Take 1 tablet (10 mg total) by mouth daily. 90 tablet 0  . glucose blood (ONETOUCH VERIO) test strip USE TO TEST TWICE A DAY 200 strip 3  . insulin glargine (LANTUS) 100 UNIT/ML injection Inject 0.85 mLs (85 Units total) into the skin 2 (two) times daily as needed (takes 85 units every night at bedtime 2 nd dose only if needed for elevated blood sugars). 10 mL 5  . JANUVIA 50 MG tablet TAKE 1 TABLET BY MOUTH EVERY DAY 90 tablet 0  . metFORMIN (GLUCOPHAGE) 500 MG tablet Take 1 tablet (500 mg total) by mouth 2 (two) times daily with a meal. 180 tablet 1  . metoprolol (TOPROL-XL) 200 MG 24 hr tablet Take 1 tablet (200 mg total) by mouth daily after breakfast. 90 tablet 0  . Misc Natural Products (OSTEO BI-FLEX ADV TRIPLE ST) TABS Take 1 tablet by mouth 2 (two) times a week.     . nitroGLYCERIN (NITROSTAT) 0.4 MG SL tablet Place 1 tablet (0.4 mg total) under the tongue every 5 (five) minutes as needed for chest pain. 50 tablet 10  . OneTouch Delica Lancets 56C MISC 1 each by Other route 2 (two) times daily. Dx E11.9 200 each 3  . pravastatin (PRAVACHOL) 80 MG tablet Take 1 tablet (80 mg total) by mouth daily. 90 tablet 3  .  sacubitril-valsartan (ENTRESTO) 97-103 MG Take 1 tablet by mouth 2 (two) times daily. 180 tablet 2  . sodium polystyrene (KAYEXALATE) 15 GM/60ML suspension Take 60 MLs once daily for 2 days 120 mL 0  . triamcinolone cream (KENALOG) 0.1 % Apply 1 application topically 2 (two) times daily. Avoid face and genitalia 45 g 2  . warfarin (COUMADIN) 5 MG tablet Take 1 tablet (5 mg total) by mouth daily. 90 tablet 1   No current facility-administered medications for this visit.     Past Medical History:  Diagnosis Date  . Allergy   . Anemia    "lost blood w/the cancer"  . Arthritis    back   . Atrial fibrillation ([place])    takes Coumadin daily  . Bruises easily    takes Coumadin daily  . CAD (coronary artery disease)    a. s/p CABG 11/2000 (L-LAD, S-RCA);  b. Lexiscan Myoview 11/12: EF 45%, ischemia and possibly some scar at the base of the inferolateral wall;   c. Lex MV 11/13:  EF 44%, Inf and IL scar/soft tissue atten, small mild amt of inf ischemia,  . CHF (congestive heart failure) ([place])   . Constipation    takes Colace daily  . Enlarged prostate   .  Flu 09/2013  . GERD (gastroesophageal reflux disease)    takes Nexium daily  . Glaucoma   . History of blood transfusion    "related to cancer"  . History of colon polyps   . HLD (hyperlipidemia)    takes Vytorin daily  . HTN (hypertension)    takes Amlodipine,Metoprolol, and Ramipril daily  . Insomnia    states he has always been like this.But doesn't take any meds  . Kidney stones    "I passed them all"  . Nocturia   . Peripheral edema    takes Furosemide daily  . Peripheral neuropathy   . Rheumatic fever ~ 1965  . Rheumatic heart disease    a. s/p mechanical (St. Jude) MVR 2002;  b. Echo 5/11: Mild LVH, EF 45-50%, mild AS, mild AI, mean gradient 11 mmHg, MVR with normal gradients, moderate LAE, mild RAE;  c.  Echo 11/13:   mod LVH, EF 55-60%, mild AS (mean 18 mmHg), mild AI, severe LAE, mild RAE, PASP 41  . S/P MVR (mitral  valve replacement) 11/2000  . Small bowel cancer ([place]) 2014  . SOB (shortness of breath)    with exertion  . Type II diabetes mellitus ([place])    takes Januvia,Glipizide,and Metformin daily as well as Lantus    ROS:   All systems reviewed and negative except as noted in the HPI.   Past Surgical History:  Procedure Laterality Date  . APPLICATION OF WOUND VAC  2014  . CARDIAC CATHETERIZATION  2002  . COLONOSCOPY    . CORONARY ARTERY BYPASS GRAFT  2002   x 3  . CORONARY ARTERY BYPASS GRAFT    . ESOPHAGOGASTRODUODENOSCOPY    . HERNIA REPAIR    . ICD IMPLANT N/A 02/19/2020   Procedure: ICD IMPLANT;  Surgeon: Evans Lance, MD;  Location: [facility];  Service: Cardiovascular;  Laterality: N/A;  . INCISIONAL HERNIA REPAIR N/A 03/15/2014   Procedure: LAPAROSCOPIC INCISIONAL HERNIA;  Surgeon: Harl Bowie, MD;  Location: [facility];  Service: General;  Laterality: N/A;  . INSERTION OF MESH N/A 03/15/2014   Procedure: INSERTION OF MESH;  Surgeon: Harl Bowie, MD;  Location: [facility];  Service: General;  Laterality: N/A;  . LAPAROSCOPIC INCISIONAL / UMBILICAL / [name]  03/15/2014   IHR  . LAPAROTOMY N/A 02/09/2013   Procedure: EXPLORATORY LAPAROTOMY WITH incisional biopsy intra- ABDOMINAL MASS ILOCECTOMY ;  Surgeon: Harl Bowie, MD;  Location: [facility];  Service: General;  Laterality: N/A;  . MITRAL VALVE REPLACEMENT  11/2000   #33 St. Jude mechanical mitral valve prosthesis. Dr. Servando Snare  . PORT-A-CATH REMOVAL  03/15/2014  . PORT-A-CATH REMOVAL Left 03/15/2014   Procedure: REMOVAL PORT-A-CATH;  Surgeon: Harl Bowie, MD;  Location: [place];  Service: General;  Laterality: Left;  . PORTACATH PLACEMENT N/A 02/09/2013   Procedure: INSERTION PORT-A-CATH;  Surgeon: Harl Bowie, MD;  Location: [facility];  Service: General;  Laterality: N/A;  . RIGHT/LEFT HEART CATH AND CORONARY/GRAFT ANGIOGRAPHY N/A 05/10/2017   Procedure: RIGHT/LEFT HEART CATH AND CORONARY/GRAFT  ANGIOGRAPHY;  Surgeon: Martinique, Peter M, MD;  Location: [facility];  Service: Cardiovascular;  Laterality: N/A;  . SEPTOPLASTY    . SMALL INTESTINE SURGERY       Family History  Problem Relation Age of Onset  . Diabetes Mother   . Hypertension Father   . Thyroid disease Sister   . Diabetes Brother   . Arthritis Brother   .  Depression Brother   . Heart disease Brother   . Hyperlipidemia Brother   . Hypertension Brother   . Hypertension Brother   . Diabetes Brother   . Heart disease Brother   . Hyperlipidemia Brother      Social History   Socioeconomic History  . Marital status: Married    Spouse name: Marcie Bal  . Number of children: 1  . Years of education: 57  . Highest education level: High school graduate  Occupational History  . Occupation: disabled  Tobacco Use  . Smoking status: Former Smoker    Years: 40.00    Types: Cigars  . Smokeless tobacco: Never Used  Vaping Use  . Vaping Use: Never used  Substance and Sexual Activity  . Alcohol use: No    Comment: "drank for 30 years; quit ~ 2000"  . Drug use: No    Types: Marijuana    Comment: quit in 2000 'reefer'  . Sexual activity: Yes  Other Topics Concern  . 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   Social Determinants of Health   Financial Resource Strain: Low Risk   . Difficulty of Paying Living Expenses: Not hard at all  Food Insecurity: No Food Insecurity  . Worried About Charity fundraiser in the Last Year: Never true  . Ran Out of Food in the Last Year: Never true  Transportation Needs: No Transportation Needs  . Lack of Transportation (Medical): No  . Lack of Transportation (Non-Medical): No  Physical Activity: Sufficiently Active  . Days of Exercise per Week: 3 days  . Minutes of Exercise per Session: 120 min  Stress: No Stress Concern Present  . Feeling of Stress : Not at all  Social Connections: Moderately Isolated  . Frequency of  Communication with Friends and Family: More than three times a week  . Frequency of Social Gatherings with Friends and Family: More than three times a week  . Attends Religious Services: Never  . Active Member of Clubs or Organizations: No  . Attends Archivist Meetings: Never  . Marital Status: Married  Human resources officer Violence: Not At Risk  . Fear of Current or Ex-Partner: No  . Emotionally Abused: No  . Physically Abused: No  . Sexually Abused: No     BP 116/66   Pulse 77   Ht 5\' 11"  (1.803 m)   Wt 216 lb (98 kg)   SpO2 96%   BMI 30.13 kg/m   Physical Exam:  Well appearing NAD HEENT: Unremarkable Neck:  No JVD, no thyromegally Lymphatics:  No adenopathy Back:  No CVA tenderness Lungs:  Clear with no wheezes HEART:  Regular rate rhythm, no murmurs, no rubs, no clicks Abd:  soft, positive bowel sounds, no organomegally, no rebound, no guarding Ext:  2 plus pulses, no edema, no cyanosis, no clubbing Skin:  No rashes no nodules Neuro:  CN II through XII intact, motor grossly intact  EKG - atrial fib with a controlled VR  DEVICE  Normal device function.  See PaceArt for details.   Assess/Plan: 1. Chronic systolic heart failure - his symptoms remain class 2. He will continue his current meds. 2. ICM - his is s/p remote MI. He denies angina. 3. Coags - he will continue his warfarin 4. ICD - His St. Jude ICD is working normally. We will recheck in several months.  Carleene Overlie Errol Ala,MD

## 2020-05-25 NOTE — Patient Instructions (Signed)

## 2020-06-01 ENCOUNTER — Other Ambulatory Visit: Payer: Self-pay

## 2020-06-01 ENCOUNTER — Encounter: Payer: Self-pay | Admitting: Family Medicine

## 2020-06-01 ENCOUNTER — Ambulatory Visit (INDEPENDENT_AMBULATORY_CARE_PROVIDER_SITE_OTHER): Payer: PPO | Admitting: Family Medicine

## 2020-06-01 VITALS — BP 135/74 | HR 76 | Temp 97.3°F | Resp 20 | Ht 71.0 in | Wt 222.0 lb

## 2020-06-01 DIAGNOSIS — Z794 Long term (current) use of insulin: Secondary | ICD-10-CM | POA: Diagnosis not present

## 2020-06-01 DIAGNOSIS — E119 Type 2 diabetes mellitus without complications: Secondary | ICD-10-CM

## 2020-06-01 DIAGNOSIS — Z9889 Other specified postprocedural states: Secondary | ICD-10-CM | POA: Diagnosis not present

## 2020-06-01 DIAGNOSIS — I10 Essential (primary) hypertension: Secondary | ICD-10-CM

## 2020-06-01 DIAGNOSIS — I482 Chronic atrial fibrillation, unspecified: Secondary | ICD-10-CM

## 2020-06-01 DIAGNOSIS — I255 Ischemic cardiomyopathy: Secondary | ICD-10-CM

## 2020-06-01 LAB — COAGUCHEK XS/INR WAIVED
INR: 2.3 — ABNORMAL HIGH (ref 0.9–1.1)
Prothrombin Time: 27.6 s

## 2020-06-01 MED ORDER — DEXILANT 60 MG PO CPDR
60.0000 mg | DELAYED_RELEASE_CAPSULE | Freq: Every day | ORAL | 1 refills | Status: DC
Start: 2020-06-01 — End: 2020-07-01

## 2020-06-01 MED ORDER — SITAGLIPTIN PHOSPHATE 50 MG PO TABS: 50.0000 mg | ORAL_TABLET | Freq: Every day | ORAL | 0 refills | Status: DC

## 2020-06-01 MED ORDER — INSULIN GLARGINE 100 UNIT/ML ~~LOC~~ SOLN: 85.0000 [IU] | Freq: Two times a day (BID) | SUBCUTANEOUS | 5 refills | Status: DC | PRN

## 2020-06-01 MED ORDER — SACUBITRIL-VALSARTAN 97-103 MG PO TABS: 1.0000 | ORAL_TABLET | Freq: Two times a day (BID) | ORAL | 2 refills | Status: DC

## 2020-06-01 MED ORDER — GLIPIZIDE ER 10 MG PO TB24: 10.0000 mg | ORAL_TABLET | Freq: Every day | ORAL | 1 refills | Status: DC

## 2020-06-01 NOTE — Progress Notes (Signed)
Chief Complaint  Patient presents with  . Coagulation Disorder    HPI  Patient presents today for Mittral valve replacement and Atrial fibrillation follow up. Pt. is treated with rate control and anticoagulation. Pt.  denies palpitations, rapid rate, chest pain, dyspnea and edema. There has been no bleeding from nose or gums. Pt. has not noticed blood with urine or stool.  Although there is routine bruising easily, it is not excessive. INR goal 2.5-3.5 It was high last week. He ate extra greens to bring it down.   PMH: Smoking status noted ROS: Per HPI  Objective: BP 135/74   Pulse 76   Temp (!) 97.3 F (36.3 C) (Temporal)   Resp 20   Ht 5\' 11"  (1.803 m)   Wt 222 lb (100.7 kg)   SpO2 99%   BMI 30.96 kg/m  Gen: NAD, alert, cooperative with exam HEENT: NCAT, EOMI, PERRL CV: RRR, good S1/S2, no murmur Resp: CTABL, no wheezes, non-labored Abd: SNTND, BS present, no guarding or organomegaly Ext: No edema, warm Neuro: Alert and oriented, No gross deficits  INR= 2.3 Assessment and plan:  1. Essential hypertension   2. Chronic atrial fibrillation (HCC)   3. MITRAL VALVE REPLACEMENT, HX OF   4. Type 2 diabetes mellitus without complication, with long-term current use of insulin ([place])   5. Ischemic cardiomyopathy     Meds ordered this encounter  Medications  . dexlansoprazole (DEXILANT) 60 MG capsule    Sig: Take 1 capsule (60 mg total) by mouth daily.    Dispense:  90 capsule    Refill:  1  . glipiZIDE (GLUCOTROL XL) 10 MG 24 hr tablet    Sig: Take 1 tablet (10 mg total) by mouth daily.    Dispense:  90 tablet    Refill:  1  . insulin glargine (LANTUS) 100 UNIT/ML injection    Sig: Inject 0.85 mLs (85 Units total) into the skin 2 (two) times daily as needed (takes 85 units every night at bedtime 2 nd dose only if needed for elevated blood sugars).    Dispense:  10 mL    Refill:  5  . sitaGLIPtin (JANUVIA) 50 MG tablet    Sig: Take 1 tablet (50 mg total) by mouth daily.     Dispense:  90 tablet    Refill:  0  . sacubitril-valsartan (ENTRESTO) 97-103 MG    Sig: Take 1 tablet by mouth 2 (two) times daily.    Dispense:  180 tablet    Refill:  2    INR now just below therapeutic. Pt. Will cut back on Vitamin K foods. Continue coumadin as is  Follow up as needed.  Claretta Fraise, MD

## 2020-06-01 NOTE — Addendum Note (Signed)
Addended by: Michaela Corner on: 06/01/2020 10:32 AM   Modules accepted: Orders

## 2020-06-02 ENCOUNTER — Telehealth: Payer: Self-pay | Admitting: Family Medicine

## 2020-06-06 ENCOUNTER — Telehealth: Payer: Self-pay | Admitting: Family Medicine

## 2020-06-06 NOTE — Telephone Encounter (Signed)
Aware of result per result note  

## 2020-06-07 ENCOUNTER — Encounter: Payer: Self-pay | Admitting: *Deleted

## 2020-06-07 ENCOUNTER — Telehealth: Payer: Self-pay | Admitting: Family Medicine

## 2020-06-07 NOTE — Telephone Encounter (Signed)
Please review and advise.

## 2020-06-07 NOTE — Telephone Encounter (Signed)
Patient has been experiencing shortness of breath and chest pressure recently, seems to worsen when he lies down.  Wife is wondering if he should be seen here or if she should contact his cardiologist.  I recommended to her that they contact the cardiologist and inform them of his symptoms.

## 2020-06-07 NOTE — Telephone Encounter (Signed)
Pt. Needs to be seen for this. Thanks, WS 

## 2020-06-16 ENCOUNTER — Ambulatory Visit: Payer: PPO | Admitting: Family Medicine

## 2020-06-20 ENCOUNTER — Inpatient Hospital Stay (HOSPITAL_COMMUNITY)
Admission: EM | Admit: 2020-06-20 | Discharge: 2020-07-01 | DRG: 246 | Disposition: A | Discharge status: Home / Self Care | Payer: PPO | Attending: Cardiovascular Disease | Admitting: Cardiovascular Disease

## 2020-06-20 ENCOUNTER — Other Ambulatory Visit: Payer: Self-pay

## 2020-06-20 ENCOUNTER — Encounter (HOSPITAL_COMMUNITY): Payer: Self-pay | Admitting: *Deleted

## 2020-06-20 ENCOUNTER — Ambulatory Visit (INDEPENDENT_AMBULATORY_CARE_PROVIDER_SITE_OTHER): Payer: PPO | Admitting: Family

## 2020-06-20 ENCOUNTER — Emergency Department (HOSPITAL_COMMUNITY): Payer: PPO

## 2020-06-20 VITALS — BP 126/65 | HR 78 | Temp 97.6°F | Ht 71.0 in | Wt 222.0 lb

## 2020-06-20 DIAGNOSIS — M791 Myalgia, unspecified site: Secondary | ICD-10-CM | POA: Diagnosis not present

## 2020-06-20 DIAGNOSIS — Z7984 Long term (current) use of oral hypoglycemic drugs: Secondary | ICD-10-CM

## 2020-06-20 DIAGNOSIS — T508X5A Adverse effect of diagnostic agents, initial encounter: Secondary | ICD-10-CM | POA: Diagnosis not present

## 2020-06-20 DIAGNOSIS — I352 Nonrheumatic aortic (valve) stenosis with insufficiency: Secondary | ICD-10-CM | POA: Diagnosis present

## 2020-06-20 DIAGNOSIS — N1831 Chronic kidney disease, stage 3a: Secondary | ICD-10-CM | POA: Diagnosis not present

## 2020-06-20 DIAGNOSIS — I482 Chronic atrial fibrillation, unspecified: Secondary | ICD-10-CM | POA: Diagnosis not present

## 2020-06-20 DIAGNOSIS — I4510 Unspecified right bundle-branch block: Secondary | ICD-10-CM | POA: Diagnosis present

## 2020-06-20 DIAGNOSIS — E1121 Type 2 diabetes mellitus with diabetic nephropathy: Secondary | ICD-10-CM

## 2020-06-20 DIAGNOSIS — Z95810 Presence of automatic (implantable) cardiac defibrillator: Secondary | ICD-10-CM

## 2020-06-20 DIAGNOSIS — Z952 Presence of prosthetic heart valve: Secondary | ICD-10-CM | POA: Diagnosis not present

## 2020-06-20 DIAGNOSIS — D649 Anemia, unspecified: Secondary | ICD-10-CM | POA: Diagnosis present

## 2020-06-20 DIAGNOSIS — Z8639 Personal history of other endocrine, nutritional and metabolic disease: Secondary | ICD-10-CM

## 2020-06-20 DIAGNOSIS — Z79899 Other long term (current) drug therapy: Secondary | ICD-10-CM

## 2020-06-20 DIAGNOSIS — R0789 Other chest pain: Secondary | ICD-10-CM

## 2020-06-20 DIAGNOSIS — I959 Hypotension, unspecified: Secondary | ICD-10-CM | POA: Diagnosis not present

## 2020-06-20 DIAGNOSIS — I25118 Atherosclerotic heart disease of native coronary artery with other forms of angina pectoris: Secondary | ICD-10-CM

## 2020-06-20 DIAGNOSIS — I350 Nonrheumatic aortic (valve) stenosis: Secondary | ICD-10-CM | POA: Diagnosis not present

## 2020-06-20 DIAGNOSIS — N141 Nephropathy induced by other drugs, medicaments and biological substances: Secondary | ICD-10-CM

## 2020-06-20 DIAGNOSIS — Z7982 Long term (current) use of aspirin: Secondary | ICD-10-CM

## 2020-06-20 DIAGNOSIS — I130 Hypertensive heart and chronic kidney disease with heart failure and stage 1 through stage 4 chronic kidney disease, or unspecified chronic kidney disease: Secondary | ICD-10-CM | POA: Diagnosis present

## 2020-06-20 DIAGNOSIS — I5020 Unspecified systolic (congestive) heart failure: Secondary | ICD-10-CM | POA: Diagnosis present

## 2020-06-20 DIAGNOSIS — E1169 Type 2 diabetes mellitus with other specified complication: Secondary | ICD-10-CM

## 2020-06-20 DIAGNOSIS — Z833 Family history of diabetes mellitus: Secondary | ICD-10-CM

## 2020-06-20 DIAGNOSIS — E7800 Pure hypercholesterolemia, unspecified: Secondary | ICD-10-CM | POA: Diagnosis not present

## 2020-06-20 DIAGNOSIS — I4891 Unspecified atrial fibrillation: Secondary | ICD-10-CM | POA: Diagnosis present

## 2020-06-20 DIAGNOSIS — Z9889 Other specified postprocedural states: Secondary | ICD-10-CM | POA: Diagnosis not present

## 2020-06-20 DIAGNOSIS — E119 Type 2 diabetes mellitus without complications: Secondary | ICD-10-CM | POA: Diagnosis not present

## 2020-06-20 DIAGNOSIS — I059 Rheumatic mitral valve disease, unspecified: Secondary | ICD-10-CM | POA: Diagnosis not present

## 2020-06-20 DIAGNOSIS — I25710 Atherosclerosis of autologous vein coronary artery bypass graft(s) with unstable angina pectoris: Secondary | ICD-10-CM | POA: Diagnosis present

## 2020-06-20 DIAGNOSIS — I255 Ischemic cardiomyopathy: Secondary | ICD-10-CM | POA: Diagnosis not present

## 2020-06-20 DIAGNOSIS — E1151 Type 2 diabetes mellitus with diabetic peripheral angiopathy without gangrene: Secondary | ICD-10-CM | POA: Diagnosis present

## 2020-06-20 DIAGNOSIS — I5022 Chronic systolic (congestive) heart failure: Secondary | ICD-10-CM

## 2020-06-20 DIAGNOSIS — E1122 Type 2 diabetes mellitus with diabetic chronic kidney disease: Secondary | ICD-10-CM | POA: Diagnosis present

## 2020-06-20 DIAGNOSIS — R0602 Shortness of breath: Secondary | ICD-10-CM

## 2020-06-20 DIAGNOSIS — I5043 Acute on chronic combined systolic (congestive) and diastolic (congestive) heart failure: Secondary | ICD-10-CM | POA: Diagnosis present

## 2020-06-20 DIAGNOSIS — Z87891 Personal history of nicotine dependence: Secondary | ICD-10-CM

## 2020-06-20 DIAGNOSIS — I4821 Permanent atrial fibrillation: Secondary | ICD-10-CM | POA: Diagnosis present

## 2020-06-20 DIAGNOSIS — N183 Chronic kidney disease, stage 3 unspecified: Secondary | ICD-10-CM | POA: Diagnosis not present

## 2020-06-20 DIAGNOSIS — Z87442 Personal history of urinary calculi: Secondary | ICD-10-CM

## 2020-06-20 DIAGNOSIS — E8770 Fluid overload, unspecified: Secondary | ICD-10-CM | POA: Diagnosis not present

## 2020-06-20 DIAGNOSIS — Z83438 Family history of other disorder of lipoprotein metabolism and other lipidemia: Secondary | ICD-10-CM

## 2020-06-20 DIAGNOSIS — I10 Essential (primary) hypertension: Secondary | ICD-10-CM | POA: Diagnosis present

## 2020-06-20 DIAGNOSIS — N1832 Chronic kidney disease, stage 3b: Secondary | ICD-10-CM | POA: Diagnosis present

## 2020-06-20 DIAGNOSIS — N179 Acute kidney failure, unspecified: Secondary | ICD-10-CM | POA: Diagnosis not present

## 2020-06-20 DIAGNOSIS — Z8249 Family history of ischemic heart disease and other diseases of the circulatory system: Secondary | ICD-10-CM

## 2020-06-20 DIAGNOSIS — E785 Hyperlipidemia, unspecified: Secondary | ICD-10-CM

## 2020-06-20 DIAGNOSIS — Z20822 Contact with and (suspected) exposure to covid-19: Secondary | ICD-10-CM | POA: Diagnosis present

## 2020-06-20 DIAGNOSIS — E11649 Type 2 diabetes mellitus with hypoglycemia without coma: Secondary | ICD-10-CM | POA: Diagnosis not present

## 2020-06-20 DIAGNOSIS — E872 Acidosis: Secondary | ICD-10-CM | POA: Diagnosis not present

## 2020-06-20 DIAGNOSIS — I25110 Atherosclerotic heart disease of native coronary artery with unstable angina pectoris: Secondary | ICD-10-CM | POA: Diagnosis not present

## 2020-06-20 DIAGNOSIS — Z7901 Long term (current) use of anticoagulants: Secondary | ICD-10-CM

## 2020-06-20 DIAGNOSIS — Z794 Long term (current) use of insulin: Secondary | ICD-10-CM

## 2020-06-20 DIAGNOSIS — Z955 Presence of coronary angioplasty implant and graft: Secondary | ICD-10-CM

## 2020-06-20 DIAGNOSIS — E1165 Type 2 diabetes mellitus with hyperglycemia: Secondary | ICD-10-CM | POA: Diagnosis present

## 2020-06-20 DIAGNOSIS — E162 Hypoglycemia, unspecified: Secondary | ICD-10-CM

## 2020-06-20 DIAGNOSIS — I200 Unstable angina: Secondary | ICD-10-CM | POA: Diagnosis not present

## 2020-06-20 DIAGNOSIS — I70208 Unspecified atherosclerosis of native arteries of extremities, other extremity: Secondary | ICD-10-CM | POA: Diagnosis present

## 2020-06-20 DIAGNOSIS — Z8572 Personal history of non-Hodgkin lymphomas: Secondary | ICD-10-CM

## 2020-06-20 DIAGNOSIS — R0689 Other abnormalities of breathing: Secondary | ICD-10-CM | POA: Diagnosis not present

## 2020-06-20 DIAGNOSIS — R079 Chest pain, unspecified: Secondary | ICD-10-CM | POA: Insufficient documentation

## 2020-06-20 DIAGNOSIS — I4819 Other persistent atrial fibrillation: Secondary | ICD-10-CM | POA: Diagnosis not present

## 2020-06-20 DIAGNOSIS — D631 Anemia in chronic kidney disease: Secondary | ICD-10-CM | POA: Diagnosis not present

## 2020-06-20 DIAGNOSIS — Z881 Allergy status to other antibiotic agents status: Secondary | ICD-10-CM

## 2020-06-20 DIAGNOSIS — I5040 Unspecified combined systolic (congestive) and diastolic (congestive) heart failure: Secondary | ICD-10-CM | POA: Diagnosis not present

## 2020-06-20 DIAGNOSIS — I2510 Atherosclerotic heart disease of native coronary artery without angina pectoris: Secondary | ICD-10-CM | POA: Diagnosis present

## 2020-06-20 LAB — RESPIRATORY PANEL BY RT PCR (FLU A&B, COVID)
Influenza A by PCR: NEGATIVE
Influenza B by PCR: NEGATIVE
SARS Coronavirus 2 by RT PCR: NEGATIVE

## 2020-06-20 LAB — PROTIME-INR
INR: 2.6 — ABNORMAL HIGH (ref 0.8–1.2)
Prothrombin Time: 27.3 seconds — ABNORMAL HIGH (ref 11.4–15.2)

## 2020-06-20 LAB — BASIC METABOLIC PANEL
Anion gap: 10 (ref 5–15)
BUN: 61 mg/dL — ABNORMAL HIGH (ref 8–23)
CO2: 20 mmol/L — ABNORMAL LOW (ref 22–32)
Calcium: 8.9 mg/dL (ref 8.9–10.3)
Chloride: 108 mmol/L (ref 98–111)
Creatinine, Ser: 1.51 mg/dL — ABNORMAL HIGH (ref 0.61–1.24)
GFR, Estimated: 47 mL/min — ABNORMAL LOW (ref >60)
Glucose, Bld: 208 mg/dL — ABNORMAL HIGH (ref 70–99)
Potassium: 4.4 mmol/L (ref 3.5–5.1)
Sodium: 138 mmol/L (ref 135–145)

## 2020-06-20 LAB — CBC
HCT: 40.9 % (ref 39.0–52.0)
Hemoglobin: 13.3 g/dL (ref 13.0–17.0)
MCH: 29.4 pg (ref 26.0–34.0)
MCHC: 32.5 g/dL (ref 30.0–36.0)
MCV: 90.5 fL (ref 80.0–100.0)
Platelets: 184 10*3/uL (ref 150–400)
RBC: 4.52 MIL/uL (ref 4.22–5.81)
RDW: 14.6 % (ref 11.5–15.5)
WBC: 6.5 10*3/uL (ref 4.0–10.5)
nRBC: 0 % (ref 0.0–0.2)

## 2020-06-20 LAB — CBG MONITORING, ED: Glucose-Capillary: 152 mg/dL — ABNORMAL HIGH (ref 70–99)

## 2020-06-20 LAB — TROPONIN I (HIGH SENSITIVITY)
Troponin I (High Sensitivity): 14 ng/L (ref <18)
Troponin I (High Sensitivity): 16 ng/L (ref <18)

## 2020-06-20 LAB — HEMOGLOBIN A1C
Hgb A1c MFr Bld: 7 % — ABNORMAL HIGH (ref 4.8–5.6)
Mean Plasma Glucose: 154.2 mg/dL

## 2020-06-20 LAB — BRAIN NATRIURETIC PEPTIDE: B Natriuretic Peptide: 2291 pg/mL — ABNORMAL HIGH (ref 0.0–100.0)

## 2020-06-20 LAB — GLUCOSE, CAPILLARY: Glucose-Capillary: 195 mg/dL — ABNORMAL HIGH (ref 70–99)

## 2020-06-20 MED ORDER — METOPROLOL SUCCINATE ER 100 MG PO TB24
200.0000 mg | ORAL_TABLET | Freq: Every day | ORAL | Status: DC
  Administered 2020-06-21 – 2020-07-01 (×9): 200 mg via ORAL
  Filled 2020-06-20: qty 2
  Filled 2020-06-20 (×2): qty 4
  Filled 2020-06-20 (×7): qty 2

## 2020-06-20 MED ORDER — PANTOPRAZOLE SODIUM 40 MG PO TBEC
40.0000 mg | DELAYED_RELEASE_TABLET | Freq: Every day | ORAL | Status: DC
  Administered 2020-06-20 – 2020-06-30 (×11): 40 mg via ORAL
  Filled 2020-06-20 (×12): qty 1

## 2020-06-20 MED ORDER — ONDANSETRON HCL 4 MG/2ML IJ SOLN: 4.0000 mg | Freq: Four times a day (QID) | INTRAMUSCULAR | Status: DC | PRN

## 2020-06-20 MED ORDER — ZOLPIDEM TARTRATE 5 MG PO TABS: 5.0000 mg | ORAL_TABLET | Freq: Every evening | ORAL | Status: DC | PRN

## 2020-06-20 MED ORDER — SACUBITRIL-VALSARTAN 97-103 MG PO TABS
1.0000 | ORAL_TABLET | Freq: Two times a day (BID) | ORAL | Status: DC
  Administered 2020-06-20 – 2020-06-25 (×10): 1 via ORAL
  Filled 2020-06-20 (×16): qty 1

## 2020-06-20 MED ORDER — WARFARIN SODIUM 5 MG PO TABS: 5.0000 mg | ORAL_TABLET | Freq: Every day | ORAL | Status: DC

## 2020-06-20 MED ORDER — INSULIN ASPART 100 UNIT/ML ~~LOC~~ SOLN
0.0000 [IU] | Freq: Three times a day (TID) | SUBCUTANEOUS | Status: DC
  Administered 2020-06-20: 3 [IU] via SUBCUTANEOUS
  Administered 2020-06-21: 5 [IU] via SUBCUTANEOUS
  Administered 2020-06-21: 3 [IU] via SUBCUTANEOUS
  Administered 2020-06-22: 5 [IU] via SUBCUTANEOUS
  Administered 2020-06-22 – 2020-06-23 (×2): 3 [IU] via SUBCUTANEOUS
  Administered 2020-06-23: 2 [IU] via SUBCUTANEOUS
  Administered 2020-06-24 (×2): 3 [IU] via SUBCUTANEOUS
  Administered 2020-06-25 (×2): 5 [IU] via SUBCUTANEOUS
  Administered 2020-06-25: 3 [IU] via SUBCUTANEOUS
  Administered 2020-06-26: 2 [IU] via SUBCUTANEOUS
  Administered 2020-06-26: 11 [IU] via SUBCUTANEOUS
  Administered 2020-06-26: 3 [IU] via SUBCUTANEOUS
  Administered 2020-06-27: 2 [IU] via SUBCUTANEOUS
  Administered 2020-06-28: 5 [IU] via SUBCUTANEOUS
  Administered 2020-06-29 – 2020-06-30 (×4): 3 [IU] via SUBCUTANEOUS
  Filled 2020-06-20: qty 1

## 2020-06-20 MED ORDER — PRAVASTATIN SODIUM 40 MG PO TABS
80.0000 mg | ORAL_TABLET | Freq: Every day | ORAL | Status: DC
  Administered 2020-06-20: 80 mg via ORAL
  Filled 2020-06-20 (×3): qty 1

## 2020-06-20 MED ORDER — ISOSORBIDE MONONITRATE ER 30 MG PO TB24
30.0000 mg | ORAL_TABLET | Freq: Every day | ORAL | Status: DC
  Administered 2020-06-20 – 2020-06-30 (×9): 30 mg via ORAL
  Filled 2020-06-20 (×13): qty 1

## 2020-06-20 MED ORDER — PRAVASTATIN SODIUM 80 MG PO TABS: 80.0000 mg | ORAL_TABLET | Freq: Every day | ORAL | 1 refills | Status: DC

## 2020-06-20 MED ORDER — INSULIN ASPART 100 UNIT/ML ~~LOC~~ SOLN
0.0000 [IU] | Freq: Every day | SUBCUTANEOUS | Status: DC
  Administered 2020-06-25: 2 [IU] via SUBCUTANEOUS

## 2020-06-20 MED ORDER — ASPIRIN 81 MG PO CHEW
324.0000 mg | CHEWABLE_TABLET | Freq: Once | ORAL | Status: AC
  Administered 2020-06-20: 324 mg via ORAL
  Filled 2020-06-20: qty 4

## 2020-06-20 MED ORDER — FUROSEMIDE 10 MG/ML IJ SOLN
20.0000 mg | Freq: Two times a day (BID) | INTRAMUSCULAR | Status: DC
  Administered 2020-06-20 – 2020-06-24 (×7): 20 mg via INTRAVENOUS
  Filled 2020-06-20 (×8): qty 2

## 2020-06-20 MED ORDER — ACETAMINOPHEN 325 MG PO TABS: 650.0000 mg | ORAL_TABLET | ORAL | Status: DC | PRN

## 2020-06-20 NOTE — Consult Note (Addendum)
Cardiology Consultation:   Patient ID: AHMOD GILLESPIE MRN: [id number]; DOB: [birth year]  Admit date: 06/20/2020 Date of Consult: 06/20/2020  Primary Care Provider: Claretta Fraise, MD [facility] Cardiologist: Minus Breeding, MD   [facility] Electrophysiologist:  Cristopher Peru, MD     Patient Profile:   RAYANE GALLARDO is a 69 y.o. male with a hx of CAD status post CABG in 2002, ischemic cardiomyopathy, ICD who is being seen today for the evaluation of chest pain at the request of Dr. Karle Starch.  History of Present Illness:   Mr. Alessio is a 69 year old male patient with history of CAD status post CABG and St Jude MVR 11/2000 (LIMA to the LAD and SVG to RCA), chronic atrial fib.  He had a decline in his LV EF 35% Lexiscan showed a new anterior wall perfusion defect.  He underwent cardiac cath in 2018 and had an occluded SVG to the RCA but there was flow through the native vessel.  LAD was occluded but good flow through the LIMA.  He was treated with Entresto.  Follow-up echo 12/23/2019 LVEF 25 to 30% with global hypokinesis worse despite meds.  He underwent ICD placement 02/19/2020 by Dr. Lovena Le.  Patient presents with 3-week history of worsening chest tightness and shortness of breath.  He says he can walk 30 feet pushing his lawnmower and has to stop because of chest tightness and pressure and shortness of breath.  Tightness would ease with rest.  This morning the tightness was more prolonged and waxing and waning and PCP sent him to the ER.  He says his Lasix was also reduced to 20 mg daily because of rising kidney function. He eats sausage biscuits bacon and country ham he for breakfast.   Past Medical History:  Diagnosis Date  . Allergy   . Anemia    "lost blood w/the cancer"  . Arthritis    back   . Atrial fibrillation ([place])    takes Coumadin daily  . Bruises easily    takes Coumadin daily  . CAD (coronary artery disease)    a. s/p CABG 11/2000 (L-LAD, S-RCA);  b. Lexiscan  Myoview 11/12: EF 45%, ischemia and possibly some scar at the base of the inferolateral wall;   c. Lex MV 11/13:  EF 44%, Inf and IL scar/soft tissue atten, small mild amt of inf ischemia,  . CHF (congestive heart failure) ([place])   . Constipation    takes Colace daily  . Enlarged prostate   . Flu 09/2013  . GERD (gastroesophageal reflux disease)    takes Nexium daily  . Glaucoma   . History of blood transfusion    "related to cancer"  . History of colon polyps   . HLD (hyperlipidemia)    takes Vytorin daily  . HTN (hypertension)    takes Amlodipine,Metoprolol, and Ramipril daily  . Insomnia    states he has always been like this.But doesn't take any meds  . Kidney stones    "I passed them all"  . Nocturia   . Peripheral edema    takes Furosemide daily  . Peripheral neuropathy   . Rheumatic fever ~ 1965  . Rheumatic heart disease    a. s/p mechanical (St. Jude) MVR 2002;  b. Echo 5/11: Mild LVH, EF 45-50%, mild AS, mild AI, mean gradient 11 mmHg, MVR with normal gradients, moderate LAE, mild RAE;  c.  Echo 11/13:   mod LVH, EF 55-60%, mild AS (mean 18 mmHg), mild AI, severe LAE,  mild RAE, PASP 41  . 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 SOB (shortness of breath)    with exertion  . Type II diabetes mellitus ([place])    takes Januvia,Glipizide,and Metformin daily as well as Lantus    Past Surgical History:  Procedure Laterality Date  . APPLICATION OF WOUND VAC  2014  . CARDIAC CATHETERIZATION  2002  . COLONOSCOPY    . CORONARY ARTERY BYPASS GRAFT  2002   x 3  . CORONARY ARTERY BYPASS GRAFT    . ESOPHAGOGASTRODUODENOSCOPY    . HERNIA REPAIR    . ICD IMPLANT N/A 02/19/2020   Procedure: ICD IMPLANT;  Surgeon: Evans Lance, MD;  Location: [place] [facility];  Service: Cardiovascular;  Laterality: N/A;  . INCISIONAL HERNIA REPAIR N/A 03/15/2014   Procedure: LAPAROSCOPIC INCISIONAL HERNIA;  Surgeon: Harl Bowie, MD;  Location: [place];   Service: General;  Laterality: N/A;  . INSERTION OF MESH N/A 03/15/2014   Procedure: INSERTION OF MESH;  Surgeon: Harl Bowie, MD;  Location: [place];  Service: General;  Laterality: N/A;  . LAPAROSCOPIC INCISIONAL / UMBILICAL / [place]  03/15/2014   IHR  . LAPAROTOMY N/A 02/09/2013   Procedure: EXPLORATORY LAPAROTOMY WITH incisional biopsy intra- ABDOMINAL MASS ILOCECTOMY ;  Surgeon: Harl Bowie, MD;  Location: [facility];  Service: General;  Laterality: N/A;  . MITRAL VALVE REPLACEMENT  11/2000   #33 St. Jude mechanical mitral valve prosthesis. Dr. Servando Snare  . PORT-A-CATH REMOVAL  03/15/2014  . PORT-A-CATH REMOVAL Left 03/15/2014   Procedure: REMOVAL PORT-A-CATH;  Surgeon: Harl Bowie, MD;  Location: [place];  Service: General;  Laterality: Left;  . PORTACATH PLACEMENT N/A 02/09/2013   Procedure: INSERTION PORT-A-CATH;  Surgeon: Harl Bowie, MD;  Location: [facility];  Service: General;  Laterality: N/A;  . RIGHT/LEFT HEART CATH AND CORONARY/GRAFT ANGIOGRAPHY N/A 05/10/2017   Procedure: RIGHT/LEFT HEART CATH AND CORONARY/GRAFT ANGIOGRAPHY;  Surgeon: Martinique, Peter M, MD;  Location: [place] [facility];  Service: Cardiovascular;  Laterality: N/A;  . SEPTOPLASTY    . SMALL INTESTINE SURGERY       Home Medications:  Prior to Admission medications   Medication Sig Start Date End Date Taking? Authorizing Provider  aspirin 81 MG chewable tablet Chew 81 mg by mouth daily.   Yes [provider]  dexlansoprazole (DEXILANT) 60 MG capsule Take 1 capsule (60 mg total) by mouth daily. 06/01/20  Yes Stacks, Cletus Gash, MD  furosemide (LASIX) 40 MG tablet TAKE 1 TABLET BY MOUTH EVERY DAY Patient taking differently: Take 20 mg by mouth daily.  05/13/20  Yes Claretta Fraise, MD  glipiZIDE (GLUCOTROL XL) 10 MG 24 hr tablet Take 1 tablet (10 mg total) by mouth daily. 06/01/20  Yes Claretta Fraise, MD  insulin glargine (LANTUS) 100 UNIT/ML injection Inject 0.85 mLs (85 Units total) into the  skin 2 (two) times daily as needed (takes 85 units every night at bedtime 2 nd dose only if needed for elevated blood sugars). Patient taking differently: Inject 85 Units into the skin 2 (two) times daily as needed (only if needed for elevated blood sugars).  06/01/20  Yes Claretta Fraise, MD  metFORMIN (GLUCOPHAGE) 500 MG tablet Take 1 tablet (500 mg total) by mouth 2 (two) times daily with a meal. 05/19/20  Yes Claretta Fraise, MD  metoprolol (TOPROL-XL) 200 MG 24 hr tablet Take 1 tablet (200 mg total) by mouth daily after  breakfast. 05/19/20  Yes Stacks, Cletus Gash, MD  sacubitril-valsartan (ENTRESTO) 97-103 MG Take 1 tablet by mouth 2 (two) times daily. 06/01/20  Yes Stacks, Cletus Gash, MD  sitaGLIPtin (JANUVIA) 50 MG tablet Take 1 tablet (50 mg total) by mouth daily. 06/01/20  Yes Stacks, Cletus Gash, MD  triamcinolone cream (KENALOG) 0.1 % Apply 1 application topically 2 (two) times daily. Avoid face and genitalia 07/27/19  Yes Claretta Fraise, MD  warfarin (COUMADIN) 5 MG tablet Take 1 tablet (5 mg total) by mouth daily. 05/19/20  Yes Stacks, Cletus Gash, MD  glucose blood (ONETOUCH VERIO) test strip USE TO TEST TWICE A DAY 12/07/19   Claretta Fraise, MD  Misc Natural Products (OSTEO BI-FLEX ADV TRIPLE ST) TABS Take 1 tablet by mouth 2 (two) times a week.     [provider]  nitroGLYCERIN (NITROSTAT) 0.4 MG SL tablet Place 1 tablet (0.4 mg total) under the tongue every 5 (five) minutes as needed for chest pain. 09/26/19   Claretta Fraise, MD  OneTouch Delica Lancets 44Y MISC 1 each by Other route 2 (two) times daily. Dx E11.9 10/27/19   Claretta Fraise, MD  pravastatin (PRAVACHOL) 80 MG tablet Take 1 tablet (80 mg total) by mouth daily. 06/20/20   Claretta Fraise, MD    Inpatient Medications: Scheduled Meds:  Continuous Infusions:  PRN Meds:   Allergies:    Allergies  Allergen Reactions  . Doxycycline Hives    Social History:   Social History   Socioeconomic History  . Marital status: Married    Spouse  name: Marcie Bal  . Number of children: 1  . Years of education: 41  . Highest education level: High school graduate  Occupational History  . Occupation: disabled  Tobacco Use  . Smoking status: Former Smoker    Years: 40.00    Types: Cigars  . Smokeless tobacco: Never Used  Vaping Use  . Vaping Use: Never used  Substance and Sexual Activity  . Alcohol use: No    Comment: "drank for 30 years; quit ~ 2000"  . Drug use: No    Types: Marijuana    Comment: quit in 2000 'reefer'  . Sexual activity: Yes  Other Topics Concern  . 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   Social Determinants of Health   Financial Resource Strain:   . Difficulty of Paying Living Expenses: Not on file  Food Insecurity:   . Worried About Charity fundraiser in the Last Year: Not on file  . Ran Out of Food in the Last Year: Not on file  Transportation Needs:   . Lack of Transportation (Medical): Not on file  . Lack of Transportation (Non-Medical): Not on file  Physical Activity:   . Days of Exercise per Week: Not on file  . Minutes of Exercise per Session: Not on file  Stress:   . Feeling of Stress : Not on file  Social Connections:   . Frequency of Communication with Friends and Family: Not on file  . Frequency of Social Gatherings with Friends and Family: Not on file  . Attends Religious Services: Not on file  . Active Member of Clubs or Organizations: Not on file  . Attends Archivist Meetings: Not on file  . Marital Status: Not on file  Intimate Partner Violence:   . Fear of Current or Ex-Partner: Not on file  . Emotionally Abused: Not on file  . Physically Abused: Not on file  .  Sexually Abused: Not on file    Family History:    Family History  Problem Relation Age of Onset  . Diabetes Mother   . Hypertension Father   . Thyroid disease Sister   . Diabetes Brother   . Arthritis Brother   . Depression Brother   . Heart disease  Brother   . Hyperlipidemia Brother   . Hypertension Brother   . Hypertension Brother   . Diabetes Brother   . Heart disease Brother   . Hyperlipidemia Brother      ROS:  Please see the history of present illness.  Review of Systems  Constitutional: Negative.  HENT: Negative.   Cardiovascular: Positive for chest pain, dyspnea on exertion and leg swelling.  Respiratory: Positive for shortness of breath.   Endocrine: Negative.   Hematologic/Lymphatic: Negative.   Musculoskeletal: Negative.   Gastrointestinal: Negative.   Genitourinary: Negative.   Neurological: Negative.     All other ROS reviewed and negative.     Physical Exam/Data:   Vitals:   06/20/20 1530 06/20/20 1600 06/20/20 1630 06/20/20 1700  BP: 111/61 125/69 127/82 117/60  Pulse: 79 80 75 80  Resp:      Temp:      SpO2: 100% 100% 100% 100%  Weight:      Height:       No intake or output data in the 24 hours ending 06/20/20 1713 Last 3 Weights 06/20/2020 06/20/2020 06/01/2020  Weight (lbs) 220 lb 222 lb 222 lb  Weight (kg) 99.791 kg 100.699 kg 100.699 kg     Body mass index is 30.68 kg/m.  General:  Well nourished, well developed, in no acute distress  HEENT: normal Lymph: no adenopathy Neck: no JVD Endocrine:  No thryomegaly Vascular: No carotid bruits; FA pulses 2+ bilaterally without bruits  Cardiac:  normal S1, S2; irregular irregular; crisp valvular click Lungs:  clear to auscultation bilaterally, no wheezing, rhonchi or rales  Abd: soft, nontender, no hepatomegaly  Ext: Trace of ankle edema Musculoskeletal:  No deformities, BUE and BLE strength normal and equal Skin: warm and dry  Neuro:  CNs 2-12 intact, no focal abnormalities noted Psych:  Normal affect   EKG:  The EKG was personally reviewed and demonstrates: Atrial fibrillation with right bundle branch block nonspecific ST-T wave changes, no acute change Telemetry:  Telemetry was personally reviewed and demonstrates:  Atrial  fib  Relevant CV Studies:  Echo 12/2019 IMPRESSIONS     1. EF remains severely depressed to slightly worse than echo done October  2019 . Left ventricular ejection fraction, by estimation, is 25 to 30%.  The left ventricle has severely decreased function. The left ventricle  demonstrates global hypokinesis. The   left ventricular internal cavity size was severely dilated. There is mild  left ventricular hypertrophy. Left ventricular diastolic parameters are  indeterminate.   2. Right ventricular systolic function is normal. The right ventricular  size is normal. There is moderately elevated pulmonary artery systolic  pressure.   3. Left atrial size was severely dilated.   4. Normal appearing mechanical bileaflet mitral valve replacement with no  PVL . The mitral valve has been repaired/replaced. No evidence of mitral  valve regurgitation.   5. AS and mean gradient stable since echo done 07/03/18. The aortic valve  has an indeterminant number of cusps. Aortic valve regurgitation is mild.  Mild aortic valve stenosis.   Cardiac cath 2018  Mid RCA to Dist RCA lesion, 75 %stenosed.  Prox LAD lesion, 70 %  stenosed.  Mid LAD lesion, 100 %stenosed.  2nd Mrg lesion, 30 %stenosed.  Ost Cx lesion, 60 %stenosed.  SVG.  Origin lesion, 100 %stenosed.  LIMA graft was visualized by angiography and is large and anatomically normal.  LV end diastolic pressure is moderately elevated.  Hemodynamic findings consistent with severe pulmonary hypertension.   1. Severe 2 vessel obstructive CAD.     - 100% LAD after the first diagonal    - Long 75% mid to distal RCA 2. Patent LIMA to the LAD 3. Occluded SVG to the RCA 4. Moderate to severe pulmonary HTN 5. Elevated LV filling pressures without significant MV gradient.     Laboratory Data:  High Sensitivity Troponin:   Recent Labs  Lab 06/20/20 1207 06/20/20 1448  TROPONINIHS 14 16     Chemistry Recent Labs  Lab 06/20/20 1207   NA 138  K 4.4  CL 108  CO2 20*  GLUCOSE 208*  BUN 61*  CREATININE 1.51*  CALCIUM 8.9  GFRNONAA 47*  ANIONGAP 10    No results for input(s): PROT, ALBUMIN, AST, ALT, ALKPHOS, BILITOT in the last 168 hours. Hematology Recent Labs  Lab 06/20/20 1207  WBC 6.5  RBC 4.52  HGB 13.3  HCT 40.9  MCV 90.5  MCH 29.4  MCHC 32.5  RDW 14.6  PLT 184   BNPNo results for input(s): BNP, PROBNP in the last 168 hours.  DDimer No results for input(s): DDIMER in the last 168 hours.   Radiology/Studies:  DG Chest 2 View  Result Date: 06/20/2020 CLINICAL DATA:  Chest pain. EXAM: CHEST - 2 VIEW COMPARISON:  March 20, 2020. FINDINGS: Stable cardiomediastinal silhouette. Status post cardiac valve repair. No pneumothorax or pleural effusion is noted. Single lead right-sided pacemaker is unchanged. No acute pulmonary disease is noted. Bony thorax is unremarkable. IMPRESSION: No active cardiopulmonary disease. Electronically Signed   By: Marijo Conception M.D.   On: 06/20/2020 12:53     Assessment and Plan:   Unstable angina chest pain off-and-on for 3 weeks in the setting of mild fluid overload, troponins negative, EKG unchanged check BNP, diureses a bit and decide if he needs cath. will add Imdur  CAD status post CABG x2 and mechanical MVR in 2002.  Last cath 2018 occluded SVG to the RCA patent LIMA to the LAD.  Valve stable on last echo 12/2019  Acute on chronic combined systolic and diastolic CHF only mild fluid overload-dietary indiscretion and reduce Lasix by PCP because of renal insufficiency  Ischemic cardiomyopathy ejection fraction 25 to 30% 12/2019 underwent ICD 02/2020  Permanent atrial fibrillation on Coumadin rate controlled on Toprol XL 200 mg daily  ICD 02/2020 by Dr. Lovena Le   hypertension controlled on Entresto 97/103 mg daily  CKD creatinine 1.51  DM       TIMI Risk Score for Unstable Angina or Non-ST Elevation MI:   The patient's TIMI risk score is 4, which indicates a  20% risk of all cause mortality, new or recurrent myocardial infarction or need for urgent revascularization in the next 14 days.  New York Heart Association (NYHA) Functional Class NYHA Class III   CHA2DS2-VASc Score = 5  This indicates a 7.2% annual risk of stroke. The patient's score is based upon: CHF History: 1 HTN History: 1 Diabetes History: 1 Stroke History: 0 Vascular Disease History: 1 Age Score: 1 Gender Score: 0         For questions or updates, please contact [facility] Please consult [email or web address]  for contact info under    Signed, Ermalinda Barrios, PA-C  06/20/2020 5:13 PM   Pt seen and examined  I agree with findings as noted above by Gerrianne Scale  Pt is a 69 yo with known CAD and MV dz (s/p CABG and MVR) Also hx of chronic systolic CHF, atrial fibrillation.  He was recently seen by Beckie Salts in Sept 2021. OVer the past few weeks he has noticed increased SOB as well as chest pressure   WOrse with activity Note lasix recently decreased due to decline in renal function The pt presented [facility] ED today for further evaluation    Currently pain free  On exam, neck is full Lungs are relatively clear  Cardiac exam:   Irreg irreg   No S3  Crisp valve sounds Abd   SL distended  No hepatomegaly    Nontender Ext with triv edema    EKG with Atrial fibrillation  RBBB  Labs signif for trop 14, 1  BUN/CR 61/1.5   BNP 2291  INR 2.6  Impression:  Pt's hx is very worrisome for unstable angina.   Volume is up mildly; symptoms are out of proportion to exam  I have discussed with patient   I would recomm R and L heart cath with grafts to redefine anatomy/pressures Agree with lasix IV   Follow response   Again, I am not convinced volume alone explains symptoms    Pt understands and agrees to proceed. Hold warfarin tonight  Check INR tomorrow.     Dorris Carnes MD

## 2020-06-20 NOTE — ED Notes (Addendum)
Pt spouse, Marcie Bal, at bedside and reports spoke with someone over the phone prior to her arrival inquiring about spouse and was told "he has only been here 38 minutes." Marcie Bal reported "I felt like that was rude and I want to speak with someone in charge about it." Therapist, sports and ED charge RN not available at this moment. Amy from Patient Experience consulted and coming to talk with pt and pt spouse.   Pt spouse aware this RN attempted to update on pt care plan prior to her arrival and reported would like cell phone contact information added. [phone] was given to registration to be added to pt face sheet.

## 2020-06-20 NOTE — ED Triage Notes (Signed)
Chest pain for 3 weeks

## 2020-06-20 NOTE — H&P (Addendum)
[facility] History & Physical  Rob Doctor, hospital MD  DEEPAK BLESS 06/20/2020  Chief Complaint: Chest pain, DOE HPI: The patient is a 69 y.o. year-old w/ hx of HTN, ICM EF 25%, CAD sp CABG (remote), sp MVR and atrial fib on coumadin, systolic CHF on entresto presents to ED w/ c/o exertional SOB and L sided CP for 2-3 weeks.  In ED VSS, no chest pain, trop 14, EKG atrial fib and RBBB, no acute changes. CXR w/o edema. Asked to see for admission.    Pt denies any palpitation, syncope. +ankle edema. States his lasix dose was lowered from $RemoveBefo'40mg'yXJerFyknjA$  > 20 mg per PCP recently because "my kidneys were dehydrated."  No orthopnea or cough or SOB at rest. Chest pain L sided under breast w/o radiation, resolves w/ rest < 1-2 minutes. States he never had CP w/ his prior CAD prior to CABG, this is first time having CP.  Had ICD done 6/21 by Dr Lovena Le.  Cardiologist if Dr Percival Spanish.   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     ROS  no joint pain   no HA  no blurry vision  no rash  no diarrhea  no nausea/ vomiting  no dysuria  no difficulty voiding  no change in urine color   Past Medical History  Past Medical History:  Diagnosis Date  . Allergy   . Anemia    "lost blood w/the cancer"  . Arthritis    back   . Atrial fibrillation ([place])    takes Coumadin daily  . Bruises easily    takes Coumadin daily  . CAD (coronary artery disease)    a. s/p CABG 11/2000 (L-LAD, S-RCA);  b. Lexiscan Myoview 11/12: EF 45%, ischemia and possibly some scar at the base of the inferolateral wall;   c. Lex MV 11/13:  EF 44%, Inf and IL scar/soft tissue atten, small mild amt of inf ischemia,  . CHF (congestive heart failure) ([place])   . Constipation    takes Colace daily  . Enlarged prostate   . Flu 09/2013  . GERD (gastroesophageal reflux disease)    takes Nexium daily  . Glaucoma   . History of blood transfusion    "related to cancer"  . History of colon polyps   .  HLD (hyperlipidemia)    takes Vytorin daily  . HTN (hypertension)    takes Amlodipine,Metoprolol, and Ramipril daily  . Insomnia    states he has always been like this.But doesn't take any meds  . Kidney stones    "I passed them all"  . Nocturia   . Peripheral edema    takes Furosemide daily  . Peripheral neuropathy   . Rheumatic fever ~ 1965  . Rheumatic heart disease    a. s/p mechanical (St. Jude) MVR 2002;  b. Echo 5/11: Mild LVH, EF 45-50%, mild AS, mild AI, mean gradient 11 mmHg, MVR with normal gradients, moderate LAE, mild RAE;  c.  Echo 11/13:   mod LVH, EF 55-60%, mild AS (mean 18 mmHg), mild AI, severe LAE, mild RAE, PASP 41  . 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 SOB (shortness of breath)    with exertion  . Type II diabetes mellitus ([place])    takes Januvia,Glipizide,and Metformin daily as well as Lantus   Past Surgical History  Past Surgical History:  Procedure Laterality Date  . APPLICATION OF  WOUND VAC  2014  . CARDIAC CATHETERIZATION  2002  . COLONOSCOPY    . CORONARY ARTERY BYPASS GRAFT  2002   x 3  . CORONARY ARTERY BYPASS GRAFT    . ESOPHAGOGASTRODUODENOSCOPY    . HERNIA REPAIR    . ICD IMPLANT N/A 02/19/2020   Procedure: ICD IMPLANT;  Surgeon: Evans Lance, MD;  Location: [place] [facility];  Service: Cardiovascular;  Laterality: N/A;  . INCISIONAL HERNIA REPAIR N/A 03/15/2014   Procedure: LAPAROSCOPIC INCISIONAL HERNIA;  Surgeon: Harl Bowie, MD;  Location: [place];  Service: General;  Laterality: N/A;  . INSERTION OF MESH N/A 03/15/2014   Procedure: INSERTION OF MESH;  Surgeon: Harl Bowie, MD;  Location: [place];  Service: General;  Laterality: N/A;  . LAPAROSCOPIC INCISIONAL / UMBILICAL / [place]  03/15/2014   IHR  . LAPAROTOMY N/A 02/09/2013   Procedure: EXPLORATORY LAPAROTOMY WITH incisional biopsy intra- ABDOMINAL MASS ILOCECTOMY ;  Surgeon: Harl Bowie, MD;  Location: [facility];   Service: General;  Laterality: N/A;  . MITRAL VALVE REPLACEMENT  11/2000   #33 St. Jude mechanical mitral valve prosthesis. Dr. Servando Snare  . PORT-A-CATH REMOVAL  03/15/2014  . PORT-A-CATH REMOVAL Left 03/15/2014   Procedure: REMOVAL PORT-A-CATH;  Surgeon: Harl Bowie, MD;  Location: [place];  Service: General;  Laterality: Left;  . PORTACATH PLACEMENT N/A 02/09/2013   Procedure: INSERTION PORT-A-CATH;  Surgeon: Harl Bowie, MD;  Location: [facility];  Service: General;  Laterality: N/A;  . RIGHT/LEFT HEART CATH AND CORONARY/GRAFT ANGIOGRAPHY N/A 05/10/2017   Procedure: RIGHT/LEFT HEART CATH AND CORONARY/GRAFT ANGIOGRAPHY;  Surgeon: Martinique, Peter M, MD;  Location: [place] [facility];  Service: Cardiovascular;  Laterality: N/A;  . SEPTOPLASTY    . SMALL INTESTINE SURGERY     Family History  Family History  Problem Relation Age of Onset  . Diabetes Mother   . Hypertension Father   . Thyroid disease Sister   . Diabetes Brother   . Arthritis Brother   . Depression Brother   . Heart disease Brother   . Hyperlipidemia Brother   . Hypertension Brother   . Hypertension Brother   . Diabetes Brother   . Heart disease Brother   . Hyperlipidemia Brother    Social History  reports that he has quit smoking. His smoking use included cigars. He quit after 40.00 years of use. He has never used smokeless tobacco. He reports that he does not drink alcohol and does not use drugs. Allergies  Allergies  Allergen Reactions  . Doxycycline Hives   Home medications Prior to Admission medications   Medication Sig Start Date End Date Taking? Authorizing Provider  aspirin 81 MG chewable tablet Chew 81 mg by mouth daily.   Yes [provider]  dexlansoprazole (DEXILANT) 60 MG capsule Take 1 capsule (60 mg total) by mouth daily. 06/01/20  Yes Stacks, Cletus Gash, MD  furosemide (LASIX) 40 MG tablet TAKE 1 TABLET BY MOUTH EVERY DAY Patient taking differently: Take 20 mg by mouth daily.  05/13/20  Yes  Claretta Fraise, MD  glipiZIDE (GLUCOTROL XL) 10 MG 24 hr tablet Take 1 tablet (10 mg total) by mouth daily. 06/01/20  Yes Claretta Fraise, MD  insulin glargine (LANTUS) 100 UNIT/ML injection Inject 0.85 mLs (85 Units total) into the skin 2 (two) times daily as needed (takes 85 units every night at bedtime 2 nd dose only if needed for elevated blood sugars). Patient taking differently: Inject 85 Units  into the skin 2 (two) times daily as needed (only if needed for elevated blood sugars).  06/01/20  Yes Claretta Fraise, MD  metFORMIN (GLUCOPHAGE) 500 MG tablet Take 1 tablet (500 mg total) by mouth 2 (two) times daily with a meal. 05/19/20  Yes Claretta Fraise, MD  metoprolol (TOPROL-XL) 200 MG 24 hr tablet Take 1 tablet (200 mg total) by mouth daily after breakfast. 05/19/20  Yes Stacks, Cletus Gash, MD  sacubitril-valsartan (ENTRESTO) 97-103 MG Take 1 tablet by mouth 2 (two) times daily. 06/01/20  Yes Stacks, Cletus Gash, MD  sitaGLIPtin (JANUVIA) 50 MG tablet Take 1 tablet (50 mg total) by mouth daily. 06/01/20  Yes Stacks, Cletus Gash, MD  triamcinolone cream (KENALOG) 0.1 % Apply 1 application topically 2 (two) times daily. Avoid face and genitalia 07/27/19  Yes Claretta Fraise, MD  warfarin (COUMADIN) 5 MG tablet Take 1 tablet (5 mg total) by mouth daily. 05/19/20  Yes Stacks, Cletus Gash, MD  glucose blood (ONETOUCH VERIO) test strip USE TO TEST TWICE A DAY 12/07/19   Claretta Fraise, MD  Misc Natural Products (OSTEO BI-FLEX ADV TRIPLE ST) TABS Take 1 tablet by mouth 2 (two) times a week.     [provider]  nitroGLYCERIN (NITROSTAT) 0.4 MG SL tablet Place 1 tablet (0.4 mg total) under the tongue every 5 (five) minutes as needed for chest pain. 09/26/19   Claretta Fraise, MD  OneTouch Delica Lancets 13K MISC 1 each by Other route 2 (two) times daily. Dx E11.9 10/27/19   Claretta Fraise, MD  pravastatin (PRAVACHOL) 80 MG tablet Take 1 tablet (80 mg total) by mouth daily. 06/20/20   Claretta Fraise, MD  sodium polystyrene  (KAYEXALATE) 15 GM/60ML suspension Take 60 MLs once daily for 2 days Patient not taking: Reported on 06/20/2020 02/11/20   Claretta Fraise, MD       Exam Gen alert,  Nasal O2, no distress No rash, cyanosis or gangrene Sclera anicteric, throat clear  No jvd or bruits, JVP 10 cm Chest rales L base, R clear RRR no MRG Abd soft ntnd no mass or ascites +bs GU normal male MS no joint effusions or deformity Ext +pitting pretib edema bilat lower legs, below the knees Neuro is alert, Ox 3 , nf    Home meds:  - asa 81/ lasix 20 qd/ toprol xl 200 qam/ entresto 97-103 bid/ sl ntg prn/ pravachol 80  - januvia 50 qd/ metformin 500 bid/ glipizide xl 10 qd/ lantus 70 u qpm only is BS > 180, if BS < 180 doesn't take any lantus  - coumadin 5 hs    BP 130/68,  HR 80's afib, RR 20  98.6 temp    CXR - IMPRESSION: No active cardiopulmonary disease.   EKG - Atrial fibrillation Right bundle branch block Septal infarct , age undetermined T wave abnormality, consider inferolateral ischemia Abnormal ECG   Assessment/ Plan: 1. Chest pain/unstable angina - exertional L sided CP x 2-3 weeks at home. No prior hx of CP. Also SOB w/ activity, both resolve on resting. SP remote CABG, hx syst CHF on entresto, recent ICD in June, h/o MVR/ afib on coumadin. Mild vol overload after recent lowering of lasix dose. Trop wnl, EKG w/o acute changes, afib rate controlled. Cardiology evaluating.  2. Systolic CHF , chronic - cont entresto, BB, reduced lasix 20 po qd 3. SP MVR - on coumadin , INR 2.6 in range 4. CKD 3a- creat 1.3- 1.5 since 2015.  eGFR 48- 55 5. SP ICD - f/b  Dr Lovena Le, done June 2021 6. IDDM - taking lantus high dose prn BS > 180 at home, and several po agents. Plan moderate SSI for here 7. Atrial fib - rate controlled, BP wnl, HR 80's on BB and coumadin, INR rx 8. H/o small bowel lymphoma - sp surgery 2014, in remission f/b ONC         Kelly Splinter  MD 06/20/2020, 4:18 PM

## 2020-06-20 NOTE — ED Notes (Signed)
Pt give permission to contact pt spouse with updates. Attempted to reach pt spouse at contact number listed on facesheet. No answer at this time.

## 2020-06-20 NOTE — Progress Notes (Signed)
Subjective:    Patient ID: John [name], male    DOB: 02/19/1951, 69 y.o.   MRN: [id number]  No chief complaint on file.  Pt presents to the office today with chest pain that started 3 weeks. He reports chest tightness. He is followed by Cardiologists and has a hx of A Fib,CHF, CAD, hx mitral valve replacement, ICE in place that was placed two months.   Chest Pain  This is a new problem. The current episode started 1 to 4 weeks ago. The problem occurs intermittently. The problem has been unchanged. The pain is present in the substernal region. The pain is at a severity of 5/10. The quality of the pain is described as tightness. The pain does not radiate. Associated symptoms include exertional chest pressure, malaise/fatigue, shortness of breath and weakness. Pertinent negatives include no cough, headaches, irregular heartbeat, lower extremity edema or palpitations. The pain is aggravated by deep breathing. He has tried rest for the symptoms. The treatment provided mild relief.      Review of Systems  Constitutional: Positive for malaise/fatigue.  Respiratory: Positive for shortness of breath. Negative for cough.   Cardiovascular: Positive for chest pain. Negative for palpitations.  Neurological: Positive for weakness. Negative for headaches.  All other systems reviewed and are negative.      Objective:   Physical Exam Vitals reviewed.  Constitutional:      General: He is not in acute distress.    Appearance: He is well-developed.  HENT:     Head: Normocephalic.     Right Ear: External ear normal.     Left Ear: External ear normal.  Eyes:     General:        Right eye: No discharge.        Left eye: No discharge.     Pupils: Pupils are equal, round, and reactive to light.  Neck:     Thyroid: No thyromegaly.  Cardiovascular:     Rate and Rhythm: Normal rate. Rhythm irregularly irregular.     Heart sounds: Normal heart sounds. No murmur heard.   Pulmonary:     Effort:  Pulmonary effort is normal. No respiratory distress.     Breath sounds: Normal breath sounds. No wheezing.  Abdominal:     General: Bowel sounds are normal. There is no distension.     Palpations: Abdomen is soft.     Tenderness: There is no abdominal tenderness.  Musculoskeletal:        General: No tenderness. Normal range of motion.     Cervical back: Normal range of motion and neck supple.  Skin:    General: Skin is warm and dry.     Findings: No erythema or rash.  Neurological:     Mental Status: He is alert and oriented to person, place, and time.     Cranial Nerves: No cranial nerve deficit.     Deep Tendon Reflexes: Reflexes are normal and symmetric.  Psychiatric:        Behavior: Behavior normal.        Thought Content: Thought content normal.        Judgment: Judgment normal.       BP 126/65   Pulse 78   Temp 97.6 F (36.4 C) (Temporal)   Ht 5\' 11"  (1.803 m)   Wt 222 lb (100.7 kg)   SpO2 99%   BMI 30.96 kg/m      Assessment & Plan:  John [name] comes in today with  chief complaint of Chest Pain   Diagnosis and orders addressed:  1. Chest tightness - EKG 12-Lead  2. Chronic atrial fibrillation (HCC)  3. Coronary artery disease of native artery of native heart with stable angina pectoris ([place])  4. Chronic systolic congestive heart failure ([place])  5. ICD (implantable cardioverter-defibrillator) in place  6. MITRAL VALVE REPLACEMENT, HX OF  7. SOB (shortness of breath) on exertion  8. Hx of hyperkalemia  Given heart hx and hx of hyperkalemia will send to ED. His heart rate is very irregular.  EMS called and transported to [facility] to rule out acute issues.   Evelina Dun, FNP

## 2020-06-20 NOTE — ED Notes (Signed)
Snack given by ED NT per Cardiologist request.

## 2020-06-20 NOTE — ED Notes (Signed)
Cardiology at bedside.

## 2020-06-20 NOTE — ED Provider Notes (Signed)
[facility] EMERGENCY DEPARTMENT Provider Note  CSN: [id number] Arrival date & time: 06/20/20 1159    History Chief Complaint  Patient presents with  . Chest Pain    HPI  John [name] is a 69 y.o. male with history of rheumatic heart disease, ischemic cardiomyopathy, and CAD s/p MVR, ICD and CABG reports intermittent exertional chest pains off and on for the last several weeks. Worse with activity such as mowing his lawn, improved with rest. He has not sought medical attention for these symptoms until today when his wife convinced him to see his PCP and then referred to the ED for evaluation. Currently chest pain free. He is on coumadin for his mitral valve and subsequent afib.    Past Medical History:  Diagnosis Date  . Allergy   . Anemia    "lost blood w/the cancer"  . Arthritis    back   . Atrial fibrillation ([place])    takes Coumadin daily  . Bruises easily    takes Coumadin daily  . CAD (coronary artery disease)    a. s/p CABG 11/2000 (L-LAD, S-RCA);  b. Lexiscan Myoview 11/12: EF 45%, ischemia and possibly some scar at the base of the inferolateral wall;   c. Lex MV 11/13:  EF 44%, Inf and IL scar/soft tissue atten, small mild amt of inf ischemia,  . CHF (congestive heart failure) ([place])   . Constipation    takes Colace daily  . Enlarged prostate   . Flu 09/2013  . GERD (gastroesophageal reflux disease)    takes Nexium daily  . Glaucoma   . History of blood transfusion    "related to cancer"  . History of colon polyps   . HLD (hyperlipidemia)    takes Vytorin daily  . HTN (hypertension)    takes Amlodipine,Metoprolol, and Ramipril daily  . Insomnia    states he has always been like this.But doesn't take any meds  . Kidney stones    "I passed them all"  . Nocturia   . Peripheral edema    takes Furosemide daily  . Peripheral neuropathy   . Rheumatic fever ~ 1965  . Rheumatic heart disease    a. s/p mechanical (St. Jude) MVR 2002;  b. Echo 5/11: Mild LVH,  EF 45-50%, mild AS, mild AI, mean gradient 11 mmHg, MVR with normal gradients, moderate LAE, mild RAE;  c.  Echo 11/13:   mod LVH, EF 55-60%, mild AS (mean 18 mmHg), mild AI, severe LAE, mild RAE, PASP 41  . 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 SOB (shortness of breath)    with exertion  . Type II diabetes mellitus ([place])    takes Januvia,Glipizide,and Metformin daily as well as Lantus    Past Surgical History:  Procedure Laterality Date  . APPLICATION OF WOUND VAC  2014  . CARDIAC CATHETERIZATION  2002  . COLONOSCOPY    . CORONARY ARTERY BYPASS GRAFT  2002   x 3  . CORONARY ARTERY BYPASS GRAFT    . ESOPHAGOGASTRODUODENOSCOPY    . HERNIA REPAIR    . ICD IMPLANT N/A 02/19/2020   Procedure: ICD IMPLANT;  Surgeon: Evans Lance, MD;  Location: [place] [facility];  Service: Cardiovascular;  Laterality: N/A;  . INCISIONAL HERNIA REPAIR N/A 03/15/2014   Procedure: LAPAROSCOPIC INCISIONAL HERNIA;  Surgeon: Harl Bowie, MD;  Location: [place];  Service: General;  Laterality: N/A;  . INSERTION OF MESH N/A  03/15/2014   Procedure: INSERTION OF MESH;  Surgeon: Harl Bowie, MD;  Location: [place];  Service: General;  Laterality: N/A;  . LAPAROSCOPIC INCISIONAL / UMBILICAL / [place]  03/15/2014   IHR  . LAPAROTOMY N/A 02/09/2013   Procedure: EXPLORATORY LAPAROTOMY WITH incisional biopsy intra- ABDOMINAL MASS ILOCECTOMY ;  Surgeon: Harl Bowie, MD;  Location: [facility];  Service: General;  Laterality: N/A;  . MITRAL VALVE REPLACEMENT  11/2000   #33 St. Jude mechanical mitral valve prosthesis. Dr. Servando Snare  . PORT-A-CATH REMOVAL  03/15/2014  . PORT-A-CATH REMOVAL Left 03/15/2014   Procedure: REMOVAL PORT-A-CATH;  Surgeon: Harl Bowie, MD;  Location: [place];  Service: General;  Laterality: Left;  . PORTACATH PLACEMENT N/A 02/09/2013   Procedure: INSERTION PORT-A-CATH;  Surgeon: Harl Bowie, MD;  Location: [facility];  Service: General;   Laterality: N/A;  . RIGHT/LEFT HEART CATH AND CORONARY/GRAFT ANGIOGRAPHY N/A 05/10/2017   Procedure: RIGHT/LEFT HEART CATH AND CORONARY/GRAFT ANGIOGRAPHY;  Surgeon: Martinique, Peter M, MD;  Location: [place] [facility];  Service: Cardiovascular;  Laterality: N/A;  . SEPTOPLASTY    . SMALL INTESTINE SURGERY      Family History  Problem Relation Age of Onset  . Diabetes Mother   . Hypertension Father   . Thyroid disease Sister   . Diabetes Brother   . Arthritis Brother   . Depression Brother   . Heart disease Brother   . Hyperlipidemia Brother   . Hypertension Brother   . Hypertension Brother   . Diabetes Brother   . Heart disease Brother   . Hyperlipidemia Brother     Social History   Tobacco Use  . Smoking status: Former Smoker    Years: 40.00    Types: Cigars  . Smokeless tobacco: Never Used  Vaping Use  . Vaping Use: Never used  Substance Use Topics  . Alcohol use: No    Comment: "drank for 30 years; quit ~ 2000"  . Drug use: No    Types: Marijuana    Comment: quit in 2000 'reefer'     Home Medications Prior to Admission medications   Medication Sig Start Date End Date Taking? Authorizing Provider  aspirin 81 MG chewable tablet Chew 81 mg by mouth daily.   Yes [provider]  dexlansoprazole (DEXILANT) 60 MG capsule Take 1 capsule (60 mg total) by mouth daily. 06/01/20  Yes Stacks, Cletus Gash, MD  furosemide (LASIX) 40 MG tablet TAKE 1 TABLET BY MOUTH EVERY DAY Patient taking differently: Take 20 mg by mouth daily.  05/13/20  Yes Claretta Fraise, MD  glipiZIDE (GLUCOTROL XL) 10 MG 24 hr tablet Take 1 tablet (10 mg total) by mouth daily. 06/01/20  Yes Claretta Fraise, MD  insulin glargine (LANTUS) 100 UNIT/ML injection Inject 0.85 mLs (85 Units total) into the skin 2 (two) times daily as needed (takes 85 units every night at bedtime 2 nd dose only if needed for elevated blood sugars). 06/01/20  Yes Claretta Fraise, MD  metFORMIN (GLUCOPHAGE) 500 MG tablet Take 1 tablet  (500 mg total) by mouth 2 (two) times daily with a meal. 05/19/20  Yes Claretta Fraise, MD  metoprolol (TOPROL-XL) 200 MG 24 hr tablet Take 1 tablet (200 mg total) by mouth daily after breakfast. 05/19/20  Yes Claretta Fraise, MD  pravastatin (PRAVACHOL) 80 MG tablet Take 1 tablet (80 mg total) by mouth daily. 05/19/20  Yes Stacks, Cletus Gash, MD  sacubitril-valsartan (ENTRESTO) 97-103 MG Take 1 tablet by mouth 2 (  two) times daily. 06/01/20  Yes Stacks, Cletus Gash, MD  sitaGLIPtin (JANUVIA) 50 MG tablet Take 1 tablet (50 mg total) by mouth daily. 06/01/20  Yes Stacks, Cletus Gash, MD  triamcinolone cream (KENALOG) 0.1 % Apply 1 application topically 2 (two) times daily. Avoid face and genitalia 07/27/19  Yes Claretta Fraise, MD  warfarin (COUMADIN) 5 MG tablet Take 1 tablet (5 mg total) by mouth daily. 05/19/20  Yes Stacks, Cletus Gash, MD  glucose blood (ONETOUCH VERIO) test strip USE TO TEST TWICE A DAY 12/07/19   Claretta Fraise, MD  Misc Natural Products (OSTEO BI-FLEX ADV TRIPLE ST) TABS Take 1 tablet by mouth 2 (two) times a week.     [provider]  nitroGLYCERIN (NITROSTAT) 0.4 MG SL tablet Place 1 tablet (0.4 mg total) under the tongue every 5 (five) minutes as needed for chest pain. 09/26/19   Claretta Fraise, MD  OneTouch Delica Lancets 82U MISC 1 each by Other route 2 (two) times daily. Dx E11.9 10/27/19   Claretta Fraise, MD  sodium polystyrene (KAYEXALATE) 15 GM/60ML suspension Take 60 MLs once daily for 2 days Patient not taking: Reported on 06/20/2020 02/11/20   Claretta Fraise, MD     Allergies    Doxycycline   Review of Systems   Review of Systems A comprehensive review of systems was completed and negative except as noted in HPI.    Physical Exam BP (!) 141/67   Pulse 83   Temp 98.7 F (37.1 C)   Resp 20   Ht 5\' 11"  (1.803 m)   Wt 99.8 kg   SpO2 99%   BMI 30.68 kg/m   Physical Exam Vitals and nursing note reviewed.  Constitutional:      Appearance: Normal appearance.  HENT:     Head:  Normocephalic and atraumatic.     Nose: Nose normal.     Mouth/Throat:     Mouth: Mucous membranes are moist.  Eyes:     Extraocular Movements: Extraocular movements intact.     Conjunctiva/sclera: Conjunctivae normal.  Cardiovascular:     Rate and Rhythm: Normal rate. Rhythm irregular.  Pulmonary:     Effort: Pulmonary effort is normal.     Breath sounds: Normal breath sounds.  Abdominal:     General: Abdomen is flat.     Palpations: Abdomen is soft.     Tenderness: There is no abdominal tenderness.  Musculoskeletal:        General: No swelling. Normal range of motion.     Cervical back: Neck supple.  Skin:    General: Skin is warm and dry.  Neurological:     General: No focal deficit present.     Mental Status: He is alert.  Psychiatric:        Mood and Affect: Mood normal.      ED Results / Procedures / Treatments   Labs (all labs ordered are listed, but only abnormal results are displayed) Labs Reviewed  BASIC METABOLIC PANEL  CBC  PROTIME-INR  TROPONIN I (HIGH SENSITIVITY)    EKG EKG Interpretation  Date/Time:  Monday June 20 2020 12:05:53 EDT Ventricular Rate:  89 PR Interval:    QRS Duration: 154 QT Interval:  440 QTC Calculation: 535 R Axis:   135 Text Interpretation: Atrial fibrillation Right bundle branch block Septal infarct , age undetermined T wave abnormality, consider inferolateral ischemia Abnormal ECG No significant change since last tracing Confirmed by Calvert Cantor [phone]) on 06/20/2020 12:17:55 PM   Radiology DG Chest 2 View  Result Date: 06/20/2020 CLINICAL DATA:  Chest pain. EXAM: CHEST - 2 VIEW COMPARISON:  March 20, 2020. FINDINGS: Stable cardiomediastinal silhouette. Status post cardiac valve repair. No pneumothorax or pleural effusion is noted. Single lead right-sided pacemaker is unchanged. No acute pulmonary disease is noted. Bony thorax is unremarkable. IMPRESSION: No active cardiopulmonary disease. Electronically Signed    By: Marijo Conception M.D.   On: 06/20/2020 12:53    Procedures Procedures  Medications Ordered in the ED Medications - No data to display   MDM Rules/Calculators/A&P MDM Patient with significant cardiac history here with exertional chest pain. Currently pain free.  ED Course  I have reviewed the triage vital signs and the nursing notes.  Pertinent labs & imaging results that were available during my care of the patient were reviewed by me and considered in my medical decision making (see chart for details).  Clinical Course as of Jun 21 700  Mon Jun 20, 2020  1441 CBC normal. BMP with mild hyperglycemia, creatinine at baseline. First Trop is neg.    [CS]  4210 Care of the patient signed out to Dr. Rogene Houston at the change of shift.    [CS]    Clinical Course User Index [CS] Truddie Hidden, MD    Final Clinical Impression(s) / ED Diagnoses Final diagnoses:  None    Rx / DC Orders ED Discharge Orders    None       Truddie Hidden, MD 06/21/20 [phone]

## 2020-06-21 DIAGNOSIS — I200 Unstable angina: Secondary | ICD-10-CM | POA: Diagnosis present

## 2020-06-21 DIAGNOSIS — E1165 Type 2 diabetes mellitus with hyperglycemia: Secondary | ICD-10-CM | POA: Diagnosis present

## 2020-06-21 DIAGNOSIS — Z7982 Long term (current) use of aspirin: Secondary | ICD-10-CM | POA: Diagnosis not present

## 2020-06-21 DIAGNOSIS — I25710 Atherosclerosis of autologous vein coronary artery bypass graft(s) with unstable angina pectoris: Secondary | ICD-10-CM | POA: Diagnosis present

## 2020-06-21 DIAGNOSIS — I255 Ischemic cardiomyopathy: Secondary | ICD-10-CM | POA: Diagnosis present

## 2020-06-21 DIAGNOSIS — N141 Nephropathy induced by other drugs, medicaments and biological substances: Secondary | ICD-10-CM | POA: Diagnosis not present

## 2020-06-21 DIAGNOSIS — E1151 Type 2 diabetes mellitus with diabetic peripheral angiopathy without gangrene: Secondary | ICD-10-CM | POA: Diagnosis present

## 2020-06-21 DIAGNOSIS — I352 Nonrheumatic aortic (valve) stenosis with insufficiency: Secondary | ICD-10-CM | POA: Diagnosis present

## 2020-06-21 DIAGNOSIS — R079 Chest pain, unspecified: Secondary | ICD-10-CM | POA: Diagnosis not present

## 2020-06-21 DIAGNOSIS — D649 Anemia, unspecified: Secondary | ICD-10-CM | POA: Diagnosis present

## 2020-06-21 DIAGNOSIS — I70208 Unspecified atherosclerosis of native arteries of extremities, other extremity: Secondary | ICD-10-CM | POA: Diagnosis present

## 2020-06-21 DIAGNOSIS — I10 Essential (primary) hypertension: Secondary | ICD-10-CM | POA: Diagnosis not present

## 2020-06-21 DIAGNOSIS — N179 Acute kidney failure, unspecified: Secondary | ICD-10-CM | POA: Diagnosis not present

## 2020-06-21 DIAGNOSIS — T508X5A Adverse effect of diagnostic agents, initial encounter: Secondary | ICD-10-CM | POA: Diagnosis not present

## 2020-06-21 DIAGNOSIS — I4819 Other persistent atrial fibrillation: Secondary | ICD-10-CM | POA: Diagnosis not present

## 2020-06-21 DIAGNOSIS — I5043 Acute on chronic combined systolic (congestive) and diastolic (congestive) heart failure: Secondary | ICD-10-CM | POA: Diagnosis present

## 2020-06-21 DIAGNOSIS — E11649 Type 2 diabetes mellitus with hypoglycemia without coma: Secondary | ICD-10-CM | POA: Diagnosis not present

## 2020-06-21 DIAGNOSIS — Z20822 Contact with and (suspected) exposure to covid-19: Secondary | ICD-10-CM | POA: Diagnosis present

## 2020-06-21 DIAGNOSIS — M791 Myalgia, unspecified site: Secondary | ICD-10-CM | POA: Diagnosis not present

## 2020-06-21 DIAGNOSIS — I25110 Atherosclerotic heart disease of native coronary artery with unstable angina pectoris: Secondary | ICD-10-CM | POA: Diagnosis present

## 2020-06-21 DIAGNOSIS — I4821 Permanent atrial fibrillation: Secondary | ICD-10-CM | POA: Diagnosis present

## 2020-06-21 DIAGNOSIS — E1122 Type 2 diabetes mellitus with diabetic chronic kidney disease: Secondary | ICD-10-CM | POA: Diagnosis present

## 2020-06-21 DIAGNOSIS — I4510 Unspecified right bundle-branch block: Secondary | ICD-10-CM | POA: Diagnosis present

## 2020-06-21 DIAGNOSIS — Z95810 Presence of automatic (implantable) cardiac defibrillator: Secondary | ICD-10-CM | POA: Diagnosis not present

## 2020-06-21 DIAGNOSIS — I959 Hypotension, unspecified: Secondary | ICD-10-CM | POA: Diagnosis not present

## 2020-06-21 DIAGNOSIS — I350 Nonrheumatic aortic (valve) stenosis: Secondary | ICD-10-CM | POA: Diagnosis not present

## 2020-06-21 DIAGNOSIS — E872 Acidosis: Secondary | ICD-10-CM | POA: Diagnosis not present

## 2020-06-21 DIAGNOSIS — N1832 Chronic kidney disease, stage 3b: Secondary | ICD-10-CM | POA: Diagnosis present

## 2020-06-21 DIAGNOSIS — E785 Hyperlipidemia, unspecified: Secondary | ICD-10-CM | POA: Diagnosis present

## 2020-06-21 DIAGNOSIS — Z7901 Long term (current) use of anticoagulants: Secondary | ICD-10-CM | POA: Diagnosis not present

## 2020-06-21 DIAGNOSIS — I25118 Atherosclerotic heart disease of native coronary artery with other forms of angina pectoris: Secondary | ICD-10-CM | POA: Diagnosis not present

## 2020-06-21 DIAGNOSIS — I130 Hypertensive heart and chronic kidney disease with heart failure and stage 1 through stage 4 chronic kidney disease, or unspecified chronic kidney disease: Secondary | ICD-10-CM | POA: Diagnosis present

## 2020-06-21 HISTORY — DX: Unstable angina: I20.0

## 2020-06-21 LAB — BASIC METABOLIC PANEL
Anion gap: 10 (ref 5–15)
BUN: 56 mg/dL — ABNORMAL HIGH (ref 8–23)
CO2: 22 mmol/L (ref 22–32)
Calcium: 9 mg/dL (ref 8.9–10.3)
Chloride: 107 mmol/L (ref 98–111)
Creatinine, Ser: 1.44 mg/dL — ABNORMAL HIGH (ref 0.61–1.24)
GFR, Estimated: 50 mL/min — ABNORMAL LOW (ref >60)
Glucose, Bld: 156 mg/dL — ABNORMAL HIGH (ref 70–99)
Potassium: 4.4 mmol/L (ref 3.5–5.1)
Sodium: 139 mmol/L (ref 135–145)

## 2020-06-21 LAB — GLUCOSE, CAPILLARY
Glucose-Capillary: 193 mg/dL — ABNORMAL HIGH (ref 70–99)
Glucose-Capillary: 216 mg/dL — ABNORMAL HIGH (ref 70–99)
Glucose-Capillary: 151 mg/dL — ABNORMAL HIGH (ref 70–99)

## 2020-06-21 LAB — HIV ANTIBODY (ROUTINE TESTING W REFLEX): HIV Screen 4th Generation wRfx: NONREACTIVE

## 2020-06-21 LAB — PROTIME-INR
INR: 2.6 — ABNORMAL HIGH (ref 0.8–1.2)
Prothrombin Time: 27.2 seconds — ABNORMAL HIGH (ref 11.4–15.2)

## 2020-06-21 MED ORDER — PANTOPRAZOLE SODIUM 40 MG IV SOLR
40.0000 mg | Freq: Once | INTRAVENOUS | Status: AC
  Administered 2020-06-21: 40 mg via INTRAVENOUS
  Filled 2020-06-21: qty 40

## 2020-06-21 MED ORDER — ROSUVASTATIN CALCIUM 20 MG PO TABS
40.0000 mg | ORAL_TABLET | Freq: Every day | ORAL | Status: DC
  Administered 2020-06-21 – 2020-06-27 (×7): 40 mg via ORAL
  Filled 2020-06-21 (×9): qty 2

## 2020-06-21 MED ORDER — PANTOPRAZOLE SODIUM 40 MG PO TBEC
40.0000 mg | DELAYED_RELEASE_TABLET | Freq: Once | ORAL | Status: DC
  Filled 2020-06-21: qty 1

## 2020-06-21 NOTE — Progress Notes (Signed)
Patient c/o heartburn. Provider paged via Amion and informed of request @this  time

## 2020-06-21 NOTE — Progress Notes (Addendum)
ANTICOAGULATION CONSULT NOTE - Initial Consult  Pharmacy Consult for heparin when INR <2.5 Indication: mechanical mitral valve/afib  Allergies  Allergen Reactions  . Doxycycline Hives    Patient Measurements: Height: 5\' 11"  (180.3 cm) Weight: 99.8 kg (220 lb) IBW/kg (Calculated) : 75.3 HEPARIN DW (KG): 95.8  Vital Signs: Temp: 97.9 F (36.6 C) (10/12 0900) Temp Source: Oral (10/12 0428) BP: 123/46 (10/12 0800) Pulse Rate: 68 (10/12 0800)  Labs: Recent Labs    06/20/20 1207 06/20/20 1316 06/20/20 1448 06/21/20 1010  HGB 13.3  --   --   --   HCT 40.9  --   --   --   PLT 184  --   --   --   LABPROT  --  27.3*  --  27.2*  INR  --  2.6*  --  2.6*  CREATININE 1.51*  --   --  1.44*  TROPONINIHS 14  --  16  --     Estimated Creatinine Clearance: 59.9 mL/min (A) (by C-G formula based on SCr of 1.44 mg/dL (H)).   Medical History: Past Medical History:  Diagnosis Date  . Allergy   . Anemia    "lost blood w/the cancer"  . Arthritis    back   . Atrial fibrillation ([place])    takes Coumadin daily  . Bruises easily    takes Coumadin daily  . CAD (coronary artery disease)    a. s/p CABG 11/2000 (L-LAD, S-RCA);  b. Lexiscan Myoview 11/12: EF 45%, ischemia and possibly some scar at the base of the inferolateral wall;   c. Lex MV 11/13:  EF 44%, Inf and IL scar/soft tissue atten, small mild amt of inf ischemia,  . CHF (congestive heart failure) ([place])   . Constipation    takes Colace daily  . Enlarged prostate   . Flu 09/2013  . GERD (gastroesophageal reflux disease)    takes Nexium daily  . Glaucoma   . History of blood transfusion    "related to cancer"  . History of colon polyps   . HLD (hyperlipidemia)    takes Vytorin daily  . HTN (hypertension)    takes Amlodipine,Metoprolol, and Ramipril daily  . Insomnia    states he has always been like this.But doesn't take any meds  . Kidney stones    "I passed them all"  . Nocturia   . Peripheral edema    takes  Furosemide daily  . Peripheral neuropathy   . Rheumatic fever ~ 1965  . Rheumatic heart disease    a. s/p mechanical (St. Jude) MVR 2002;  b. Echo 5/11: Mild LVH, EF 45-50%, mild AS, mild AI, mean gradient 11 mmHg, MVR with normal gradients, moderate LAE, mild RAE;  c.  Echo 11/13:   mod LVH, EF 55-60%, mild AS (mean 18 mmHg), mild AI, severe LAE, mild RAE, PASP 41  . 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 SOB (shortness of breath)    with exertion  . Type II diabetes mellitus ([facility])    takes Januvia,Glipizide,and Metformin daily as well as Lantus    Medications:  Medications Prior to Admission  Medication Sig Dispense Refill Last Dose  . aspirin 81 MG chewable tablet Chew 81 mg by mouth daily.   06/20/2020 at Unknown time  . dexlansoprazole (DEXILANT) 60 MG capsule Take 1 capsule (60 mg total) by mouth daily. 90 capsule 1 06/19/2020 at Unknown time  . furosemide (LASIX) 40 MG  tablet TAKE 1 TABLET BY MOUTH EVERY DAY (Patient taking differently: Take 20 mg by mouth daily. ) 90 tablet 0 06/20/2020 at Unknown time  . glipiZIDE (GLUCOTROL XL) 10 MG 24 hr tablet Take 1 tablet (10 mg total) by mouth daily. 90 tablet 1 06/20/2020 at Unknown time  . insulin glargine (LANTUS) 100 UNIT/ML injection Inject 0.85 mLs (85 Units total) into the skin 2 (two) times daily as needed (takes 85 units every night at bedtime 2 nd dose only if needed for elevated blood sugars). (Patient taking differently: Inject 85 Units into the skin 2 (two) times daily as needed (only if needed for elevated blood sugars). ) 10 mL 5 Past Week at Unknown time  . metFORMIN (GLUCOPHAGE) 500 MG tablet Take 1 tablet (500 mg total) by mouth 2 (two) times daily with a meal. 180 tablet 1 06/20/2020 at Unknown time  . metoprolol (TOPROL-XL) 200 MG 24 hr tablet Take 1 tablet (200 mg total) by mouth daily after breakfast. 90 tablet 0 06/20/2020 at 0900  . sacubitril-valsartan (ENTRESTO) 97-103 MG Take 1  tablet by mouth 2 (two) times daily. 180 tablet 2 06/19/2020 at Unknown time  . sitaGLIPtin (JANUVIA) 50 MG tablet Take 1 tablet (50 mg total) by mouth daily. 90 tablet 0 06/19/2020 at Unknown time  . triamcinolone cream (KENALOG) 0.1 % Apply 1 application topically 2 (two) times daily. Avoid face and genitalia 45 g 2 unk  . warfarin (COUMADIN) 5 MG tablet Take 1 tablet (5 mg total) by mouth daily. 90 tablet 1 06/19/2020 at 2000  . glucose blood (ONETOUCH VERIO) test strip USE TO TEST TWICE A DAY 200 strip 3   . Misc Natural Products (OSTEO BI-FLEX ADV TRIPLE ST) TABS Take 1 tablet by mouth 2 (two) times a week.      . nitroGLYCERIN (NITROSTAT) 0.4 MG SL tablet Place 1 tablet (0.4 mg total) under the tongue every 5 (five) minutes as needed for chest pain. 50 tablet 10   . OneTouch Delica Lancets 49Q MISC 1 each by Other route 2 (two) times daily. Dx E11.9 200 each 3   . pravastatin (PRAVACHOL) 80 MG tablet Take 1 tablet (80 mg total) by mouth daily. 90 tablet 1     Assessment: Patient with chest pain concerning for unstable angina. He Is chronically anticoagulated with Coumadin for mechanical mitral valve and afib. Plan is to proceed with R/L HC when INR < 1.7.  Currently INR is 2.6.Last dose of coumadin was 10/10 at 2000.  Pharmacy to start heparin at 1500 units/hr when INR < 2.5.   Goal of Therapy:  Heparin level 0.3-0.7 units/ml Monitor platelets by anticoagulation protocol: Yes   Plan:  Start heparin infusion at 1500 units/hr when INR < 2.5 Daily PT-INR and CBC Monitor anti-xa levels when heparin started(will need to enter) Hold coumadin for now  Isac Sarna, BS Pharm D, BCPS Clinical Pharmacist Pager [phone] 06/21/2020,11:59 AM

## 2020-06-21 NOTE — Plan of Care (Signed)

## 2020-06-21 NOTE — Progress Notes (Signed)
PROGRESS NOTE  John [name] VOZ:[id number] DOB: [birth year] DOA: 06/20/2020 PCP: Claretta Fraise, MD  Brief History:  69 y.o. male w/ PMH of CAD (s/p CABG in 2002 with cath in 04/2017 showing patent LIMA-LAD and occluded SVG-RCA with flow through native RCA with 75% mid-distal stenosis and medical management recommended initially given the long segment of stenosis), chronic combined systolic and diastolic CHF/ICM (EF 42-59% by echo in 12/2019, s/p St. Jude ICD placement by Dr. Lovena Le in 02/2020), St. Jude mechanical MVR in 2002, persistent atrial fibrillation, HTN and HLD presented with chest pain x 2 weeks with sob.  His cp has also been exertional in nature such as when push mowing his yard.  Troponins 14-->16.  BNP 2291. Cardiology was consulted due to concerns for unstable angina.  Plan is to have cardiac cath once INR <1.7.  Assessment/Plan: Angina -appreciate cardiology consult -12/23/19 Echo--EF 25-30%, global HK, s/p MVR -continue statin -continue metoprolol succinate -was on coumadin as outpatient -plan heart cath when INR<1.7 -continue imdur  Acute on chronic combined CHF/Ischemic Cardiomyopathy -on lasix 20 mg IV bid -accurate I/O -continue Entresto, metoprolol -s/p ICD 02/19/20  Permanent Atrial Fib -holding coumadin for cath -HRs controlled -continue metoprolol succinate  Diabetes mellitus type 2, controlled -06/20/20 A1C--7.0 -holding metformin and januvia -holding lantus for now -CBGs fairly controlled  S/p MVR -holding warfarin -IV heparin when INR < 2.5  CKD stage 3a -baseline creatinine 1.3-1.5 -monitor with diuresis  HTN -BPs highly variable -continue metoprolol succinate for now     Status is: Inpatient  Remains inpatient appropriate because:IV treatments appropriate due to intensity of illness or inability to take PO   Dispo: The patient is from: Home              Anticipated d/c is to: Home              Anticipated d/c date is:  3 days              Patient currently is not medically stable to d/c.        Family Communication:   spouse at bedside 10/12  Consultants:  cardiology  Code Status:  FULL   DVT Prophylaxis:  coumadin   Procedures: As Listed in Progress Note Above  Antibiotics: None      Subjective: Patient denies fevers, chills, headache, chest pain, dyspnea, nausea, vomiting, diarrhea, abdominal pain, dysuria, hematuria, hematochezia, and melena.   Objective: Vitals:   06/21/20 0600 06/21/20 0800 06/21/20 0900 06/21/20 1223  BP:  (!) 123/46  (!) 108/53  Pulse: 84 68    Resp: 11 13  18   Temp:   97.9 F (36.6 C) 97.7 F (36.5 C)  TempSrc:    Oral  SpO2: 97% 98%  98%  Weight:      Height:       No intake or output data in the 24 hours ending 06/21/20 1504 Weight change:  Exam:   General:  Pt is alert, follows commands appropriately, not in acute distress  HEENT: No icterus, No thrush, No neck mass, [place]/AT  Cardiovascular: RRR, S1/S2, no rubs, no gallops  Respiratory: CTA bilaterally, no wheezing, no crackles, no rhonchi  Abdomen: Soft/+BS, non tender, non distended, no guarding  Extremities: No edema, No lymphangitis, No petechiae, No rashes, no synovitis   Data Reviewed: I have personally reviewed following labs and imaging studies Basic Metabolic Panel: Recent Labs  Lab 06/20/20 1207 06/21/20 1010  NA 138 139  K 4.4 4.4  CL 108 107  CO2 20* 22  GLUCOSE 208* 156*  BUN 61* 56*  CREATININE 1.51* 1.44*  CALCIUM 8.9 9.0   Liver Function Tests: No results for input(s): AST, ALT, ALKPHOS, BILITOT, PROT, ALBUMIN in the last 168 hours. No results for input(s): LIPASE, AMYLASE in the last 168 hours. No results for input(s): AMMONIA in the last 168 hours. Coagulation Profile: Recent Labs  Lab 06/20/20 1316 06/21/20 1010  INR 2.6* 2.6*   CBC: Recent Labs  Lab 06/20/20 1207  WBC 6.5  HGB 13.3  HCT 40.9  MCV 90.5  PLT 184   Cardiac Enzymes: No  results for input(s): CKTOTAL, CKMB, CKMBINDEX, TROPONINI in the last 168 hours. BNP: Invalid input(s): POCBNP CBG: Recent Labs  Lab 06/20/20 1658 06/20/20 2248 06/21/20 1111  GLUCAP 152* 195* 193*   HbA1C: Recent Labs    06/20/20 1207  HGBA1C 7.0*   Urine analysis: No results found for: COLORURINE, APPEARANCEUR, LABSPEC, PHURINE, GLUCOSEU, HGBUR, BILIRUBINUR, KETONESUR, PROTEINUR, UROBILINOGEN, NITRITE, LEUKOCYTESUR Sepsis Labs: @LABRCNTIP (procalcitonin:4,lacticidven:4) ) Recent Results (from the past 240 hour(s))  Respiratory Panel by RT PCR (Flu A&B, Covid) - Nasopharyngeal Swab     Status: None   Collection Time: 06/20/20  2:58 PM   Specimen: Nasopharyngeal Swab  Result Value Ref Range Status   SARS Coronavirus 2 by RT PCR NEGATIVE NEGATIVE Final    Comment: (NOTE) SARS-CoV-2 target nucleic acids are NOT DETECTED.  The SARS-CoV-2 RNA is generally detectable in upper respiratoy specimens during the acute phase of infection. The lowest concentration of SARS-CoV-2 viral copies this assay can detect is 131 copies/mL. A negative result does not preclude SARS-Cov-2 infection and should not be used as the sole basis for treatment or other patient management decisions. A negative result may occur with  improper specimen collection/handling, submission of specimen other than nasopharyngeal swab, presence of viral mutation(s) within the areas targeted by this assay, and inadequate number of viral copies (<131 copies/mL). A negative result must be combined with clinical observations, patient history, and epidemiological information. The expected result is Negative.  Fact Sheet for Patients:  PinkCheek.be  Fact Sheet for Healthcare Providers:  GravelBags.it  This test is no t yet approved or cleared by the [place] FDA and  has been authorized for detection and/or diagnosis of SARS-CoV-2 by FDA under an  Emergency Use Authorization (EUA). This EUA will remain  in effect (meaning this test can be used) for the duration of the COVID-19 declaration under Section 564(b)(1) of the Act, 21 U.S.C. section 360bbb-3(b)(1), unless the authorization is terminated or revoked sooner.     Influenza A by PCR NEGATIVE NEGATIVE Final   Influenza B by PCR NEGATIVE NEGATIVE Final    Comment: (NOTE) The Xpert Xpress SARS-CoV-2/FLU/RSV assay is intended as an aid in  the diagnosis of influenza from Nasopharyngeal swab specimens and  should not be used as a sole basis for treatment. Nasal washings and  aspirates are unacceptable for Xpert Xpress SARS-CoV-2/FLU/RSV  testing.  Fact Sheet for Patients: PinkCheek.be  Fact Sheet for Healthcare Providers: GravelBags.it  This test is not yet approved or cleared by the [place] FDA and  has been authorized for detection and/or diagnosis of SARS-CoV-2 by  FDA under an Emergency Use Authorization (EUA). This EUA will remain  in effect (meaning this test can be used) for the duration of the  Covid-19 declaration under Section 564(b)(1) of the Act, 21  U.S.C. section 360bbb-3(b)(1), unless  the authorization is  terminated or revoked. Performed at [facility], [address] [place], [place] [address]      Scheduled Meds: . furosemide  20 mg Intravenous BID  . insulin aspart  0-15 Units Subcutaneous TID WC  . insulin aspart  0-5 Units Subcutaneous QHS  . isosorbide mononitrate  30 mg Oral Daily  . metoprolol  200 mg Oral QPC breakfast  . pantoprazole  40 mg Oral Daily  . rosuvastatin  40 mg Oral q1800  . sacubitril-valsartan  1 tablet Oral BID   Continuous Infusions:  Procedures/Studies: DG Chest 2 View  Result Date: 06/20/2020 CLINICAL DATA:  Chest pain. EXAM: CHEST - 2 VIEW COMPARISON:  March 20, 2020. FINDINGS: Stable cardiomediastinal silhouette. Status post cardiac valve repair. No  pneumothorax or pleural effusion is noted. Single lead right-sided pacemaker is unchanged. No acute pulmonary disease is noted. Bony thorax is unremarkable. IMPRESSION: No active cardiopulmonary disease. Electronically Signed   By: Marijo Conception M.D.   On: 06/20/2020 12:53    Orson Eva, DO  [facility]  If 7PM-7AM, please contact night-coverage [email or web address] Password [email or web address] 06/21/2020, 3:04 PM   LOS: 0 days

## 2020-06-21 NOTE — Progress Notes (Addendum)
Progress Note  Patient Name: John [name] Date of Encounter: 06/21/2020  Primary Cardiologist: Minus Breeding, MD   Subjective   No chest pain overnight. Breathing improved with Lasix. Reports multiple episodes of urination. Has been NPO since midnight. AM labs pending.   Inpatient Medications    Scheduled Meds: . furosemide  20 mg Intravenous BID  . insulin aspart  0-15 Units Subcutaneous TID WC  . insulin aspart  0-5 Units Subcutaneous QHS  . isosorbide mononitrate  30 mg Oral Daily  . metoprolol  200 mg Oral QPC breakfast  . pantoprazole  40 mg Oral Daily  . pravastatin  80 mg Oral Daily  . sacubitril-valsartan  1 tablet Oral BID   Continuous Infusions:  PRN Meds: acetaminophen, ondansetron (ZOFRAN) IV, zolpidem   Vital Signs    Vitals:   06/20/20 2124 06/21/20 0012 06/21/20 0425 06/21/20 0428  BP: 120/70 108/72 (!) 105/40 (!) 105/40  Pulse: 74  (!) 56 61  Resp: 18  12 19   Temp: 98.7 F (37.1 C) 98.4 F (36.9 C)  97.8 F (36.6 C)  TempSrc: Oral Oral  Oral  SpO2:   96% 100%  Weight: 99.8 kg     Height: 5\' 11"  (1.803 m)      No intake or output data in the 24 hours ending 06/21/20 0850  Last 3 Weights 06/20/2020 06/20/2020 06/20/2020  Weight (lbs) 220 lb 220 lb 222 lb  Weight (kg) 99.791 kg 99.791 kg 100.699 kg      Telemetry    Atrial fibrillation/flutter, HR in 50's to 60's.  - Personally Reviewed  ECG    Atrial fibrillation, HR 89 with known RBBB.  - Personally Reviewed  Physical Exam   General: Well developed, well nourished, male appearing in no acute distress. Head: Normocephalic, atraumatic.  Neck: Supple without bruits, JVD at 8 cm. Lungs:  Resp regular and unlabored, minimal rales along bases. Heart: Irregularly irregular, S1, S2, crisp mechanical valve sounds. 2/6 SEM along RUSB.  Abdomen: Soft, non-tender, non-distended with normoactive bowel sounds. No hepatomegaly. No rebound/guarding. No obvious abdominal masses. Extremities:  No clubbing or cyanosis, trace lower edema. Distal pedal pulses are 2+ bilaterally. Neuro: Alert and oriented X 3. Moves all extremities spontaneously. Psych: Normal affect.  Labs    Chemistry Recent Labs  Lab 06/20/20 1207  NA 138  K 4.4  CL 108  CO2 20*  GLUCOSE 208*  BUN 61*  CREATININE 1.51*  CALCIUM 8.9  GFRNONAA 47*  ANIONGAP 10     Hematology Recent Labs  Lab 06/20/20 1207  WBC 6.5  RBC 4.52  HGB 13.3  HCT 40.9  MCV 90.5  MCH 29.4  MCHC 32.5  RDW 14.6  PLT 184    Cardiac EnzymesNo results for input(s): TROPONINI in the last 168 hours. No results for input(s): TROPIPOC in the last 168 hours.   BNP Recent Labs  Lab 06/20/20 1207  BNP 2,291.0*     DDimer No results for input(s): DDIMER in the last 168 hours.   Radiology    DG Chest 2 View  Result Date: 06/20/2020 CLINICAL DATA:  Chest pain. EXAM: CHEST - 2 VIEW COMPARISON:  March 20, 2020. FINDINGS: Stable cardiomediastinal silhouette. Status post cardiac valve repair. No pneumothorax or pleural effusion is noted. Single lead right-sided pacemaker is unchanged. No acute pulmonary disease is noted. Bony thorax is unremarkable. IMPRESSION: No active cardiopulmonary disease. Electronically Signed   By: Marijo Conception M.D.   On: 06/20/2020 12:53  Cardiac Studies   Cardiac Catheterization: 04/2017  Mid RCA to Dist RCA lesion, 75 %stenosed.  Prox LAD lesion, 70 %stenosed.  Mid LAD lesion, 100 %stenosed.  2nd Mrg lesion, 30 %stenosed.  Ost Cx lesion, 60 %stenosed.  SVG.  Origin lesion, 100 %stenosed.  LIMA graft was visualized by angiography and is large and anatomically normal.  LV end diastolic pressure is moderately elevated.  Hemodynamic findings consistent with severe pulmonary hypertension.   1. Severe 2 vessel obstructive CAD.     - 100% LAD after the first diagonal    - Long 75% mid to distal RCA 2. Patent LIMA to the LAD 3. Occluded SVG to the RCA 4. Moderate to severe  pulmonary HTN 5. Elevated LV filling pressures without significant MV gradient.   Plan: Recommend medical therapy. The patient's symptoms are predominantly of dyspnea related to CHF and pulmonary HTN. He does not have significant angina. While PCI of the mid to distal RCA could be considered this is a very long segment and would require at least 2 stents. His myoview study suggests that his inferior wall is scar. Will review with Dr. Percival Spanish.   Echocardiogram: 12/2019 IMPRESSIONS    1. EF remains severely depressed to slightly worse than echo done October  2019 . Left ventricular ejection fraction, by estimation, is 25 to 30%.  The left ventricle has severely decreased function. The left ventricle  demonstrates global hypokinesis. The  left ventricular internal cavity size was severely dilated. There is mild  left ventricular hypertrophy. Left ventricular diastolic parameters are  indeterminate.  2. Right ventricular systolic function is normal. The right ventricular  size is normal. There is moderately elevated pulmonary artery systolic  pressure.  3. Left atrial size was severely dilated.  4. Normal appearing mechanical bileaflet mitral valve replacement with no  PVL . The mitral valve has been repaired/replaced. No evidence of mitral  valve regurgitation.  5. AS and mean gradient stable since echo done 07/03/18. The aortic valve  has an indeterminant number of cusps. Aortic valve regurgitation is mild.  Mild aortic valve stenosis.   Patient Profile     69 y.o. male w/ PMH of CAD (s/p CABG in 2002 with cath in 04/2017 showing patent LIMA-LAD and occluded SVG-RCA with flow through native RCA with 75% mid-distal stenosis and medical management recommended initially given the long segment of stenosis), chronic combined systolic and diastolic CHF/ICM (EF 78-24% by echo in 12/2019, s/p St. Jude ICD placement by Dr. Lovena Le in 02/2020), St. Jude mechanical MVR in 2002, persistent  atrial fibrillation, HTN and HLD who presented to [facility] ED on 06/20/2020 from PCP's office for evaluation of progressive chest pain for the past few weeks.   Assessment & Plan    1. Chest Pain concerning for Unstable Angina - He reports episodes of chest tightness for the past 2-3 weeks, occurring with exertion such as push-mowing the yard and improving with rest.  - HS Troponin negative at 14 and 16. Previously recommended by Dr. Harrington Challenger to proceed with [facility]. INR was at 2.6 yesterday. I have ordered repeat labs for this AM. If INR less than 1.7, could proceed with cath today. If above 1.7, would be postponed until at least tomorrow. Have communicated with Pharmacy in regards to bridging with Heparin. Remains on BB and statin therapy. He has been started on Imdur. Not on ASA given the use of anticoagulation.   2. CAD - He is s/p CABG in 2002 with cath in 04/2017  showing patent LIMA-LAD and occluded SVG-RCA with flow through native RCA with 75% mid-distal stenosis and medical management recommended initially given the long segment of stenosis.  - Plan for repeat cath as outlined above. Continue Toprol-XL, Imdur and Pravastatin (LDL at 82 in 05/2020). Would recommend switching to high-intensity statin therapy as no allergies are listed. Not on ASA as an outpatient given the use of Coumadin.   3. Chronic Combined Systolic and Diastolic CHF - He has a known reduced EF of 25-30% by most recent echo in 12/2019. BNP at 2291 on admission and he is receiving IV Lasix 20mg  BID. I&O's and weights have not been recorded. Plan for RHC at time of cath as well. Await BMET results this AM prior to dose adjustment.  - Remains on Toprol-XL 200mg  daily and Entresto 97-103mg  BID.   4. Permanent Atrial Fibrillation - Rates have been in the 50's to 60's by review of telemetry. On Toprol-XL 200mg  daily for rate-control.  - He is on Coumadin as an outpatient for anticoagulation. Holding Coumadin in anticipation of  catheterization this admission. Will bridge with Heparin.   5. MVR - Echo in 12/2019 showed normal appearing mechanical bileaflet mitral valve replacement with no PVL and no evidence of mitral valve regurgitation. He is on Coumadin as an outpatient. Will consult Pharmacy for Heparin once INR less than 2.5.  6. HTN - BP has been variable from 92/40 - 141/97 within the past 24 hours. At 105/40 on most recent check. Continue medications as outlined above.   7. Stage 3 CKD - Baseline creatinine 1.3 - 1.4. At 1.51 on admission. Will order a repeat BMET for this AM.    For questions or updates, please contact [place] Please consult [email or web address] for contact info under Cardiology/STEMI.   Arna Medici , PA-C 8:50 AM 06/21/2020 Pager: [phone]   Attending note:  Patient seen and examined.  Reports no active chest pain or breathlessness at rest.  I reviewed the cardiology consultation and plan per Dr. Harrington Challenger.  Patient kept n.p.o. today for possible diagnostic cardiac catheterization, although remains with therapeutic INR, Coumadin is held.  He is afebrile.  Heart rate 70 in rate controlled atrial fibrillation, blood pressure 108/53.  Lungs are clear.  Cardiac exam with irregularly irregular rhythm and crisp mechanical valve sounds.  Pertinent lab work includes potassium 4.4, BUN 56, creatinine 1.44, INR 2.6.  Patient admitted with recurring chest discomfort concerning for unstable angina.  High-sensitivity troponin I levels are nondiagnostic of ACS.  He has a history of CAD status post CABG with known graft disease, cardiomyopathy with LVEF 25 to 30%, St. Jude ICD in place, and persistent atrial fibrillation with previous St. Jude mechanical MVR on Coumadin.  Await transfer to [facility] for diagnostic right and left heart catheterization once INR 1.7 or less.  He will need to be bridged with Coumadin given mechanical mitral valve.  Pharmacy is following.   Continue IV Lasix for now, also Imdur, Toprol-XL, Crestor, and Entresto.  Follow-up BMET in a.m.  Satira Sark, M.D., F.A.C.C.

## 2020-06-22 DIAGNOSIS — I4821 Permanent atrial fibrillation: Secondary | ICD-10-CM | POA: Diagnosis not present

## 2020-06-22 DIAGNOSIS — E119 Type 2 diabetes mellitus without complications: Secondary | ICD-10-CM

## 2020-06-22 DIAGNOSIS — I200 Unstable angina: Secondary | ICD-10-CM | POA: Diagnosis not present

## 2020-06-22 DIAGNOSIS — R079 Chest pain, unspecified: Secondary | ICD-10-CM | POA: Diagnosis not present

## 2020-06-22 DIAGNOSIS — Z95810 Presence of automatic (implantable) cardiac defibrillator: Secondary | ICD-10-CM | POA: Diagnosis not present

## 2020-06-22 LAB — CBC
HCT: 40.4 % (ref 39.0–52.0)
Hemoglobin: 13 g/dL (ref 13.0–17.0)
MCH: 28.7 pg (ref 26.0–34.0)
MCHC: 32.2 g/dL (ref 30.0–36.0)
MCV: 89.2 fL (ref 80.0–100.0)
Platelets: 192 10*3/uL (ref 150–400)
RBC: 4.53 MIL/uL (ref 4.22–5.81)
RDW: 14.4 % (ref 11.5–15.5)
WBC: 6.2 10*3/uL (ref 4.0–10.5)
nRBC: 0 % (ref 0.0–0.2)

## 2020-06-22 LAB — GLUCOSE, CAPILLARY
Glucose-Capillary: 113 mg/dL — ABNORMAL HIGH (ref 70–99)
Glucose-Capillary: 116 mg/dL — ABNORMAL HIGH (ref 70–99)
Glucose-Capillary: 200 mg/dL — ABNORMAL HIGH (ref 70–99)
Glucose-Capillary: 215 mg/dL — ABNORMAL HIGH (ref 70–99)
Glucose-Capillary: 150 mg/dL — ABNORMAL HIGH (ref 70–99)

## 2020-06-22 LAB — BASIC METABOLIC PANEL
Anion gap: 9 (ref 5–15)
BUN: 52 mg/dL — ABNORMAL HIGH (ref 8–23)
CO2: 23 mmol/L (ref 22–32)
Calcium: 9 mg/dL (ref 8.9–10.3)
Chloride: 108 mmol/L (ref 98–111)
Creatinine, Ser: 1.37 mg/dL — ABNORMAL HIGH (ref 0.61–1.24)
GFR, Estimated: 53 mL/min — ABNORMAL LOW (ref >60)
Glucose, Bld: 127 mg/dL — ABNORMAL HIGH (ref 70–99)
Potassium: 4.6 mmol/L (ref 3.5–5.1)
Sodium: 140 mmol/L (ref 135–145)

## 2020-06-22 LAB — PROTIME-INR
INR: 2.4 — ABNORMAL HIGH (ref 0.8–1.2)
Prothrombin Time: 25.2 seconds — ABNORMAL HIGH (ref 11.4–15.2)

## 2020-06-22 LAB — HEPARIN LEVEL (UNFRACTIONATED)
Heparin Unfractionated: 0.46 IU/mL (ref 0.30–0.70)
Heparin Unfractionated: 0.72 IU/mL — ABNORMAL HIGH (ref 0.30–0.70)

## 2020-06-22 MED ORDER — CHLORHEXIDINE GLUCONATE CLOTH 2 % EX PADS
6.0000 | MEDICATED_PAD | Freq: Every day | CUTANEOUS | Status: DC
  Administered 2020-06-22: 6 via TOPICAL

## 2020-06-22 MED ORDER — HEPARIN (PORCINE) 25000 UT/250ML-% IV SOLN
1400.0000 [IU]/h | INTRAVENOUS | Status: DC
  Administered 2020-06-22: 1500 [IU]/h via INTRAVENOUS
  Administered 2020-06-23: 1400 [IU]/h via INTRAVENOUS
  Filled 2020-06-22 (×2): qty 250

## 2020-06-22 NOTE — Progress Notes (Signed)
PROGRESS NOTE  DAIL LEREW SKA:[id number] DOB: 1951-02-25 DOA: 06/20/2020 PCP: Claretta Fraise, MD  Brief History:  69 y.o. male w/ PMH of CAD (s/p CABG in 2002 with cath in 04/2017 showing patent LIMA-LAD and occluded SVG-RCA with flow through native RCA with 75% mid-distal stenosis and medical management recommended initially given the long segment of stenosis), chronic combined systolic and diastolic CHF/ICM (EF 20-35% by echo in 12/2019, s/p St. Jude ICD placement by Dr. Lovena Le in 02/2020), St. Jude mechanical MVR in 2002, persistent atrial fibrillation, HTN and HLD presented with chest pain x 2 weeks with sob.  His cp has also been exertional in nature such as when push mowing his yard.  Troponins 14-->16.  BNP 2291. Cardiology was consulted due to concerns for unstable angina.  Plan is to have cardiac cath once INR <1.7.  Assessment/Plan: Angina -appreciate cardiology consult -12/23/19 Echo--EF 25-30%, global HK, s/p MVR -continue statin -continue metoprolol succinate -was on coumadin as outpatient -plan heart cath when INR<1.7 -continue imdur -Per cardiology, arrangements are being made for the patient to transfer to [facility] today under cardiology service, hopefully for cardiac catheterization in a.m.  Acute on chronic combined CHF/Ischemic Cardiomyopathy -on lasix 20 mg IV bid -accurate I/O -continue Entresto, metoprolol -s/p ICD 02/19/20  Permanent Atrial Fib -holding coumadin for cath -HRs controlled -continue metoprolol succinate  Diabetes mellitus type 2, controlled -06/20/20 A1C--7.0 -holding metformin and januvia -holding lantus for now -CBGs fairly controlled  S/p MVR -holding warfarin -IV heparin when INR < 2.5  CKD stage 3a -baseline creatinine 1.3-1.5 -monitor with diuresis  HTN -BPs highly variable -continue metoprolol succinate for now     Status is: Inpatient  Remains inpatient appropriate because:IV treatments  appropriate due to intensity of illness or inability to take PO   Dispo: The patient is from: Home              Anticipated d/c is to: Transfer to [facility] for cardiac catheterization              Anticipated d/c date is: 1 day              Patient currently is not medically stable to d/c.        Family Communication:   Discussed with patient  Consultants:  cardiology  Code Status:  FULL   DVT Prophylaxis:  coumadin   Procedures: As Listed in Progress Note Above  Antibiotics: None      Subjective: Currently no chest pain, shortness of breath, abdominal pain   Objective: Vitals:   06/21/20 2235 06/22/20 0200 06/22/20 0858 06/22/20 1300  BP: (!) 103/54 110/68 (!) 107/55 (!) 108/46  Pulse:  69 65 75  Resp: 18 18 16 14   Temp: 98.6 F (37 C) 97.9 F (36.6 C) 97.6 F (36.4 C) 97.6 F (36.4 C)  TempSrc: Oral Oral Oral Oral  SpO2:  96% 97% 98%  Weight:      Height:       No intake or output data in the 24 hours ending 06/22/20 1931 Weight change:  Exam:  General exam: Alert, awake, oriented x 3 Respiratory system: Clear to auscultation. Respiratory effort normal. Cardiovascular system:RRR. No murmurs, rubs, gallops. Gastrointestinal system: Abdomen is nondistended, soft and nontender. No organomegaly or masses felt. Normal bowel sounds heard. Central nervous system: Alert and oriented. No focal neurological deficits. Extremities: No C/C/E, +pedal pulses Skin: No rashes, lesions or  ulcers  Psychiatry: Judgement and insight appear normal. Mood & affect appropriate.    Data Reviewed: I have personally reviewed following labs and imaging studies Basic Metabolic Panel: Recent Labs  Lab 06/20/20 1207 06/21/20 1010 06/22/20 0618  NA 138 139 140  K 4.4 4.4 4.6  CL 108 107 108  CO2 20* 22 23  GLUCOSE 208* 156* 127*  BUN 61* 56* 52*  CREATININE 1.51* 1.44* 1.37*  CALCIUM 8.9 9.0 9.0   Liver Function Tests: No results for input(s): AST,  ALT, ALKPHOS, BILITOT, PROT, ALBUMIN in the last 168 hours. No results for input(s): LIPASE, AMYLASE in the last 168 hours. No results for input(s): AMMONIA in the last 168 hours. Coagulation Profile: Recent Labs  Lab 06/20/20 1316 06/21/20 1010 06/22/20 0618  INR 2.6* 2.6* 2.4*   CBC: Recent Labs  Lab 06/20/20 1207 06/22/20 0618  WBC 6.5 6.2  HGB 13.3 13.0  HCT 40.9 40.4  MCV 90.5 89.2  PLT 184 192   Cardiac Enzymes: No results for input(s): CKTOTAL, CKMB, CKMBINDEX, TROPONINI in the last 168 hours. BNP: Invalid input(s): POCBNP CBG: Recent Labs  Lab 06/21/20 1656 06/21/20 2129 06/22/20 0724 06/22/20 1202 06/22/20 1652  GLUCAP 216* 151* 116* 200* 215*   HbA1C: Recent Labs    06/20/20 1207  HGBA1C 7.0*   Urine analysis: No results found for: COLORURINE, APPEARANCEUR, LABSPEC, PHURINE, GLUCOSEU, HGBUR, BILIRUBINUR, KETONESUR, PROTEINUR, UROBILINOGEN, NITRITE, LEUKOCYTESUR Sepsis Labs: @LABRCNTIP (procalcitonin:4,lacticidven:4) ) Recent Results (from the past 240 hour(s))  Respiratory Panel by RT PCR (Flu A&B, Covid) - Nasopharyngeal Swab     Status: None   Collection Time: 06/20/20  2:58 PM   Specimen: Nasopharyngeal Swab  Result Value Ref Range Status   SARS Coronavirus 2 by RT PCR NEGATIVE NEGATIVE Final    Comment: (NOTE) SARS-CoV-2 target nucleic acids are NOT DETECTED.  The SARS-CoV-2 RNA is generally detectable in upper respiratoy specimens during the acute phase of infection. The lowest concentration of SARS-CoV-2 viral copies this assay can detect is 131 copies/mL. A negative result does not preclude SARS-Cov-2 infection and should not be used as the sole basis for treatment or other patient management decisions. A negative result may occur with  improper specimen collection/handling, submission of specimen other than nasopharyngeal swab, presence of viral mutation(s) within the areas targeted by this assay, and inadequate number of viral  copies (<131 copies/mL). A negative result must be combined with clinical observations, patient history, and epidemiological information. The expected result is Negative.  Fact Sheet for Patients:  PinkCheek.be  Fact Sheet for Healthcare Providers:  GravelBags.it  This test is no t yet approved or cleared by the [place] FDA and  has been authorized for detection and/or diagnosis of SARS-CoV-2 by FDA under an Emergency Use Authorization (EUA). This EUA will remain  in effect (meaning this test can be used) for the duration of the COVID-19 declaration under Section 564(b)(1) of the Act, 21 U.S.C. section 360bbb-3(b)(1), unless the authorization is terminated or revoked sooner.     Influenza A by PCR NEGATIVE NEGATIVE Final   Influenza B by PCR NEGATIVE NEGATIVE Final    Comment: (NOTE) The Xpert Xpress SARS-CoV-2/FLU/RSV assay is intended as an aid in  the diagnosis of influenza from Nasopharyngeal swab specimens and  should not be used as a sole basis for treatment. Nasal washings and  aspirates are unacceptable for Xpert Xpress SARS-CoV-2/FLU/RSV  testing.  Fact Sheet for Patients: PinkCheek.be  Fact Sheet for Healthcare Providers: GravelBags.it  This test  is not yet approved or cleared by the [place] and  has been authorized for detection and/or diagnosis of SARS-CoV-2 by  FDA under an Emergency Use Authorization (EUA). This EUA will remain  in effect (meaning this test can be used) for the duration of the  Covid-19 declaration under Section 564(b)(1) of the Act, 21  U.S.C. section 360bbb-3(b)(1), unless the authorization is  terminated or revoked. Performed at [facility], [address], [place], [place] [address]      Scheduled Meds: . Chlorhexidine Gluconate Cloth  6 each Topical Daily  . furosemide  20 mg Intravenous BID  . insulin  aspart  0-15 Units Subcutaneous TID WC  . insulin aspart  0-5 Units Subcutaneous QHS  . isosorbide mononitrate  30 mg Oral Daily  . metoprolol  200 mg Oral QPC breakfast  . pantoprazole  40 mg Oral Daily  . rosuvastatin  40 mg Oral q1800  . sacubitril-valsartan  1 tablet Oral BID   Continuous Infusions: . heparin 1,500 Units/hr (06/22/20 0924)    Procedures/Studies: DG Chest 2 View  Result Date: 06/20/2020 CLINICAL DATA:  Chest pain. EXAM: CHEST - 2 VIEW COMPARISON:  March 20, 2020. FINDINGS: Stable cardiomediastinal silhouette. Status post cardiac valve repair. No pneumothorax or pleural effusion is noted. Single lead right-sided pacemaker is unchanged. No acute pulmonary disease is noted. Bony thorax is unremarkable. IMPRESSION: No active cardiopulmonary disease. Electronically Signed   By: Marijo Conception M.D.   On: 06/20/2020 12:53    Kathie Dike, MD  [facility]  If 7PM-7AM, please contact night-coverage [email or web address]  06/22/2020, 7:31 PM   LOS: 1 day

## 2020-06-22 NOTE — Progress Notes (Signed)
ANTICOAGULATION CONSULT NOTE -   Pharmacy Consult for heparin when INR <2.5 Indication: mechanical mitral valve/afib  Allergies  Allergen Reactions  . Doxycycline Hives    Patient Measurements: Height: 5\' 11"  (180.3 cm) Weight: 99.8 kg (220 lb) IBW/kg (Calculated) : 75.3 HEPARIN DW (KG): 95.8  Vital Signs: Temp: 97.6 F (36.4 C) (10/13 1300) Temp Source: Oral (10/13 1300) BP: 108/46 (10/13 1300) Pulse Rate: 75 (10/13 1300)  Labs: Recent Labs    06/20/20 1207 06/20/20 1316 06/20/20 1448 06/21/20 1010 06/22/20 0618 06/22/20 1528  HGB 13.3  --   --   --  13.0  --   HCT 40.9  --   --   --  40.4  --   PLT 184  --   --   --  192  --   LABPROT  --  27.3*  --  27.2* 25.2*  --   INR  --  2.6*  --  2.6* 2.4*  --   HEPARINUNFRC  --   --   --   --   --  0.46  CREATININE 1.51*  --   --  1.44* 1.37*  --   TROPONINIHS 14  --  16  --   --   --     Estimated Creatinine Clearance: 63 mL/min (A) (by C-G formula based on SCr of 1.37 mg/dL (H)).   Medical History: Past Medical History:  Diagnosis Date  . Allergy   . Anemia    "lost blood w/the cancer"  . Arthritis    back   . Atrial fibrillation ([place])    takes Coumadin daily  . Bruises easily    takes Coumadin daily  . CAD (coronary artery disease)    a. s/p CABG 11/2000 (L-LAD, S-RCA);  b. Lexiscan Myoview 11/12: EF 45%, ischemia and possibly some scar at the base of the inferolateral wall;   c. Lex MV 11/13:  EF 44%, Inf and IL scar/soft tissue atten, small mild amt of inf ischemia,  . CHF (congestive heart failure) ([place])   . Constipation    takes Colace daily  . Enlarged prostate   . Flu 09/2013  . GERD (gastroesophageal reflux disease)    takes Nexium daily  . Glaucoma   . History of blood transfusion    "related to cancer"  . History of colon polyps   . HLD (hyperlipidemia)    takes Vytorin daily  . HTN (hypertension)    takes Amlodipine,Metoprolol, and Ramipril daily  . Insomnia    states he has always been  like this.But doesn't take any meds  . Kidney stones    "I passed them all"  . Nocturia   . Peripheral edema    takes Furosemide daily  . Peripheral neuropathy   . Rheumatic fever ~ 1965  . Rheumatic heart disease    a. s/p mechanical (St. Jude) MVR 2002;  b. Echo 5/11: Mild LVH, EF 45-50%, mild AS, mild AI, mean gradient 11 mmHg, MVR with normal gradients, moderate LAE, mild RAE;  c.  Echo 11/13:   mod LVH, EF 55-60%, mild AS (mean 18 mmHg), mild AI, severe LAE, mild RAE, PASP 41  . 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 SOB (shortness of breath)    with exertion  . Type II diabetes mellitus ([facility])    takes Januvia,Glipizide,and Metformin daily as well as Lantus    Medications:  Medications Prior to Admission  Medication Sig Dispense  Refill Last Dose  . aspirin 81 MG chewable tablet Chew 81 mg by mouth daily.   06/20/2020 at Unknown time  . dexlansoprazole (DEXILANT) 60 MG capsule Take 1 capsule (60 mg total) by mouth daily. 90 capsule 1 06/19/2020 at Unknown time  . furosemide (LASIX) 40 MG tablet TAKE 1 TABLET BY MOUTH EVERY DAY (Patient taking differently: Take 20 mg by mouth daily. ) 90 tablet 0 06/20/2020 at Unknown time  . glipiZIDE (GLUCOTROL XL) 10 MG 24 hr tablet Take 1 tablet (10 mg total) by mouth daily. 90 tablet 1 06/20/2020 at Unknown time  . insulin glargine (LANTUS) 100 UNIT/ML injection Inject 0.85 mLs (85 Units total) into the skin 2 (two) times daily as needed (takes 85 units every night at bedtime 2 nd dose only if needed for elevated blood sugars). (Patient taking differently: Inject 85 Units into the skin 2 (two) times daily as needed (only if needed for elevated blood sugars). ) 10 mL 5 Past Week at Unknown time  . metFORMIN (GLUCOPHAGE) 500 MG tablet Take 1 tablet (500 mg total) by mouth 2 (two) times daily with a meal. 180 tablet 1 06/20/2020 at Unknown time  . metoprolol (TOPROL-XL) 200 MG 24 hr tablet Take 1 tablet (200 mg  total) by mouth daily after breakfast. 90 tablet 0 06/20/2020 at 0900  . sacubitril-valsartan (ENTRESTO) 97-103 MG Take 1 tablet by mouth 2 (two) times daily. 180 tablet 2 06/19/2020 at Unknown time  . sitaGLIPtin (JANUVIA) 50 MG tablet Take 1 tablet (50 mg total) by mouth daily. 90 tablet 0 06/19/2020 at Unknown time  . triamcinolone cream (KENALOG) 0.1 % Apply 1 application topically 2 (two) times daily. Avoid face and genitalia 45 g 2 unk  . warfarin (COUMADIN) 5 MG tablet Take 1 tablet (5 mg total) by mouth daily. 90 tablet 1 06/19/2020 at 2000  . glucose blood (ONETOUCH VERIO) test strip USE TO TEST TWICE A DAY 200 strip 3   . Misc Natural Products (OSTEO BI-FLEX ADV TRIPLE ST) TABS Take 1 tablet by mouth 2 (two) times a week.      . nitroGLYCERIN (NITROSTAT) 0.4 MG SL tablet Place 1 tablet (0.4 mg total) under the tongue every 5 (five) minutes as needed for chest pain. 50 tablet 10   . OneTouch Delica Lancets 27O MISC 1 each by Other route 2 (two) times daily. Dx E11.9 200 each 3   . pravastatin (PRAVACHOL) 80 MG tablet Take 1 tablet (80 mg total) by mouth daily. 90 tablet 1     Assessment: Patient with chest pain concerning for unstable angina. He Is chronically anticoagulated with Coumadin for mechanical mitral valve and afib. Plan is to proceed with R/L HC when INR < 1.7.  Currently INR is 2.6.Last dose of coumadin was 10/10 at 2000.  Pharmacy to start heparin at 1500 units/hr when INR < 2.5.  INR 2.4, will start heparin infusion without bolus Heparin level is therapeutic at 0.46  Goal of Therapy:  Heparin level 0.3-0.7 units/ml Monitor platelets by anticoagulation protocol: Yes   Plan:  Continue heparin infusion at 1500 units/hr  Daily PT-INR and CBC Monitor anti-xa levels in ~6-8 hrs and daily while on heparin Hold coumadin for now Monitor for S/S of bleeding  Isac Sarna, BS Vena [place], BCPS Clinical Pharmacist Pager [phone] 06/22/2020,4:28 PM

## 2020-06-22 NOTE — Progress Notes (Signed)
Progress Note  Patient Name: John [name] Date of Encounter: 06/22/2020  Primary Cardiologist: Minus Breeding, MD  Subjective   No chest pain or shortness of breath at rest.  Inpatient Medications    Scheduled Meds: . furosemide  20 mg Intravenous BID  . insulin aspart  0-15 Units Subcutaneous TID WC  . insulin aspart  0-5 Units Subcutaneous QHS  . isosorbide mononitrate  30 mg Oral Daily  . metoprolol  200 mg Oral QPC breakfast  . pantoprazole  40 mg Oral Daily  . rosuvastatin  40 mg Oral q1800  . sacubitril-valsartan  1 tablet Oral BID    PRN Meds: acetaminophen, ondansetron (ZOFRAN) IV, zolpidem   Vital Signs    Vitals:   06/21/20 1223 06/21/20 2114 06/21/20 2235 06/22/20 0200  BP: (!) 108/53  (!) 103/54 110/68  Pulse:    69  Resp: 18  18 18   Temp: 97.7 F (36.5 C)  98.6 F (37 C) 97.9 F (36.6 C)  TempSrc: Oral  Oral Oral  SpO2: 98% 97%  96%  Weight:      Height:       No intake or output data in the 24 hours ending 06/22/20 0838 Filed Weights   06/20/20 1209 06/20/20 2124  Weight: 99.8 kg 99.8 kg    Telemetry    Rate controlled atrial fibrillation.  Personally reviewed.  ECG    An ECG dated 06/20/2020 was personally reviewed today and demonstrated:  Atrial fibrillation with right bundle branch block, nonspecific ST-T changes.  Physical Exam   GEN: No acute distress.   Neck: No JVD. Cardiac:  Irregularly irregular, soft systolic murmur with crisp prosthetic valve sounds.Marland Kitchen  Respiratory: Nonlabored. Clear to auscultation bilaterally. GI: Soft, nontender, bowel sounds present. MS: No edema; No deformity. Neuro:  Nonfocal. Psych: Alert and oriented x 3. Normal affect.  Labs    Chemistry Recent Labs  Lab 06/20/20 1207 06/21/20 1010 06/22/20 0618  NA 138 139 140  K 4.4 4.4 4.6  CL 108 107 108  CO2 20* 22 23  GLUCOSE 208* 156* 127*  BUN 61* 56* 52*  CREATININE 1.51* 1.44* 1.37*  CALCIUM 8.9 9.0 9.0  GFRNONAA 47* 50* 53*    ANIONGAP 10 10 9      Hematology Recent Labs  Lab 06/20/20 1207 06/22/20 0618  WBC 6.5 6.2  RBC 4.52 4.53  HGB 13.3 13.0  HCT 40.9 40.4  MCV 90.5 89.2  MCH 29.4 28.7  MCHC 32.5 32.2  RDW 14.6 14.4  PLT 184 192    Cardiac Enzymes Recent Labs  Lab 06/20/20 1207 06/20/20 1448  TROPONINIHS 14 16    BNP Recent Labs  Lab 06/20/20 1207  BNP 2,291.0*     Radiology    DG Chest 2 View  Result Date: 06/20/2020 CLINICAL DATA:  Chest pain. EXAM: CHEST - 2 VIEW COMPARISON:  March 20, 2020. FINDINGS: Stable cardiomediastinal silhouette. Status post cardiac valve repair. No pneumothorax or pleural effusion is noted. Single lead right-sided pacemaker is unchanged. No acute pulmonary disease is noted. Bony thorax is unremarkable. IMPRESSION: No active cardiopulmonary disease. Electronically Signed   By: Marijo Conception M.D.   On: 06/20/2020 12:53    Cardiac Studies   Cardiac Catheterization: 04/2017  Mid RCA to Dist RCA lesion, 75 %stenosed.  Prox LAD lesion, 70 %stenosed.  Mid LAD lesion, 100 %stenosed.  2nd Mrg lesion, 30 %stenosed.  Ost Cx lesion, 60 %stenosed.  SVG.  Origin lesion, 100 %stenosed.  LIMA  graft was visualized by angiography and is large and anatomically normal.  LV end diastolic pressure is moderately elevated.  Hemodynamic findings consistent with severe pulmonary hypertension.  1. Severe 2 vessel obstructive CAD.  - 100% LAD after the first diagonal - Long 75% mid to distal RCA 2. Patent LIMA to the LAD 3. Occluded SVG to the RCA 4. Moderate to severe pulmonary HTN 5. Elevated LV filling pressures without significant MV gradient.   Echocardiogram 12/23/2019: 1. EF remains severely depressed to slightly worse than echo done October  2019 . Left ventricular ejection fraction, by estimation, is 25 to 30%.  The left ventricle has severely decreased function. The left ventricle  demonstrates global hypokinesis. The  left ventricular  internal cavity size was severely dilated. There is mild  left ventricular hypertrophy. Left ventricular diastolic parameters are  indeterminate.  2. Right ventricular systolic function is normal. The right ventricular  size is normal. There is moderately elevated pulmonary artery systolic  pressure.  3. Left atrial size was severely dilated.  4. Normal appearing mechanical bileaflet mitral valve replacement with no  PVL . The mitral valve has been repaired/replaced. No evidence of mitral  valve regurgitation.  5. AS and mean gradient stable since echo done 07/03/18. The aortic valve  has an indeterminant number of cusps. Aortic valve regurgitation is mild.  Mild aortic valve stenosis.   Patient Profile     69 y.o. male with a history of CAD (s/p CABG in 2002 with cath in 04/2017 showing patent LIMA-LAD and occluded SVG-RCA with flow through native RCA with 75% mid-distal stenosis and medical management recommended initially given the long segment of stenosis), chronic combined systolic and diastolic CHF/ICM (EF 47-82% by echo in 12/2019, s/p St. Jude ICD placement by Dr. Lovena Le in 02/2020), St. Jude mechanical MVR in 2002, persistent atrial fibrillation, HTN and HLD who presented to [facility] ED on 06/20/2020 from PCP's office for evaluation of progressive chest pain for the past few weeks.  Assessment & Plan    1.  Unstable angina, high-sensitivity troponin I levels normal range.  Patient awaits diagnostic cardiac catheterization for further evaluation.  2.  Multivessel CAD status post CABG in 2002.  Angiography in 2018 revealed patent LIMA to the LAD and occluded SVG to the RCA with medically managed native RCA disease at that time.  3.  Acute on chronic combined heart failure with LVEF 25 to 30%.  He is currently on IV Lasix with clinical improvement.  4.  Permanent atrial fibrillation, heart rate controlled.  5.  St. Jude mechanical MVR in 2002, stable by follow-up  echocardiogram earlier this year.  He is on Coumadin as an outpatient, presently held.  6.  CKD stage IIIb, current creatinine 1.37.  INR is 2.4 today.  Coumadin remains on hold per pharmacy with plan for heparin bridge.  Arranging transfer to [facility] on the cardiology service with plan for right and left heart catheterization ideally once INR is 1.7 or less.  Continue IV Lasix for now.  Also on Toprol-XL, Entresto, Imdur, and Crestor.  Signed, Rozann Lesches, MD  06/22/2020, 8:38 AM

## 2020-06-22 NOTE — Progress Notes (Addendum)
ANTICOAGULATION CONSULT NOTE - Initial Consult  Pharmacy Consult for heparin when INR <2.5 Indication: mechanical mitral valve/afib  Allergies  Allergen Reactions  . Doxycycline Hives    Patient Measurements: Height: 5\' 11"  (180.3 cm) Weight: 99.8 kg (220 lb) IBW/kg (Calculated) : 75.3 HEPARIN DW (KG): 95.8  Vital Signs: Temp: 97.9 F (36.6 C) (10/13 0200) Temp Source: Oral (10/13 0200) BP: 110/68 (10/13 0200) Pulse Rate: 69 (10/13 0200)  Labs: Recent Labs    06/20/20 1207 06/20/20 1316 06/20/20 1448 06/21/20 1010 06/22/20 0618  HGB 13.3  --   --   --  13.0  HCT 40.9  --   --   --  40.4  PLT 184  --   --   --  192  LABPROT  --  27.3*  --  27.2* 25.2*  INR  --  2.6*  --  2.6* 2.4*  CREATININE 1.51*  --   --  1.44* 1.37*  TROPONINIHS 14  --  16  --   --     Estimated Creatinine Clearance: 63 mL/min (A) (by C-G formula based on SCr of 1.37 mg/dL (H)).   Medical History: Past Medical History:  Diagnosis Date  . Allergy   . Anemia    "lost blood w/the cancer"  . Arthritis    back   . Atrial fibrillation ([place])    takes Coumadin daily  . Bruises easily    takes Coumadin daily  . CAD (coronary artery disease)    a. s/p CABG 11/2000 (L-LAD, S-RCA);  b. Lexiscan Myoview 11/12: EF 45%, ischemia and possibly some scar at the base of the inferolateral wall;   c. Lex MV 11/13:  EF 44%, Inf and IL scar/soft tissue atten, small mild amt of inf ischemia,  . CHF (congestive heart failure) ([place])   . Constipation    takes Colace daily  . Enlarged prostate   . Flu 09/2013  . GERD (gastroesophageal reflux disease)    takes Nexium daily  . Glaucoma   . History of blood transfusion    "related to cancer"  . History of colon polyps   . HLD (hyperlipidemia)    takes Vytorin daily  . HTN (hypertension)    takes Amlodipine,Metoprolol, and Ramipril daily  . Insomnia    states he has always been like this.But doesn't take any meds  . Kidney stones    "I passed them all"  .  Nocturia   . Peripheral edema    takes Furosemide daily  . Peripheral neuropathy   . Rheumatic fever ~ 1965  . Rheumatic heart disease    a. s/p mechanical (St. Jude) MVR 2002;  b. Echo 5/11: Mild LVH, EF 45-50%, mild AS, mild AI, mean gradient 11 mmHg, MVR with normal gradients, moderate LAE, mild RAE;  c.  Echo 11/13:   mod LVH, EF 55-60%, mild AS (mean 18 mmHg), mild AI, severe LAE, mild RAE, PASP 41  . 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 SOB (shortness of breath)    with exertion  . Type II diabetes mellitus (H[facility])    takes Januvia,Glipizide,and Metformin daily as well as Lantus    Medications:  Medications Prior to Admission  Medication Sig Dispense Refill Last Dose  . aspirin 81 MG chewable tablet Chew 81 mg by mouth daily.   06/20/2020 at Unknown time  . dexlansoprazole (DEXILANT) 60 MG capsule Take 1 capsule (60 mg total) by mouth daily. 90 capsule  1 06/19/2020 at Unknown time  . furosemide (LASIX) 40 MG tablet TAKE 1 TABLET BY MOUTH EVERY DAY (Patient taking differently: Take 20 mg by mouth daily. ) 90 tablet 0 06/20/2020 at Unknown time  . glipiZIDE (GLUCOTROL XL) 10 MG 24 hr tablet Take 1 tablet (10 mg total) by mouth daily. 90 tablet 1 06/20/2020 at Unknown time  . insulin glargine (LANTUS) 100 UNIT/ML injection Inject 0.85 mLs (85 Units total) into the skin 2 (two) times daily as needed (takes 85 units every night at bedtime 2 nd dose only if needed for elevated blood sugars). (Patient taking differently: Inject 85 Units into the skin 2 (two) times daily as needed (only if needed for elevated blood sugars). ) 10 mL 5 Past Week at Unknown time  . metFORMIN (GLUCOPHAGE) 500 MG tablet Take 1 tablet (500 mg total) by mouth 2 (two) times daily with a meal. 180 tablet 1 06/20/2020 at Unknown time  . metoprolol (TOPROL-XL) 200 MG 24 hr tablet Take 1 tablet (200 mg total) by mouth daily after breakfast. 90 tablet 0 06/20/2020 at 0900  .  sacubitril-valsartan (ENTRESTO) 97-103 MG Take 1 tablet by mouth 2 (two) times daily. 180 tablet 2 06/19/2020 at Unknown time  . sitaGLIPtin (JANUVIA) 50 MG tablet Take 1 tablet (50 mg total) by mouth daily. 90 tablet 0 06/19/2020 at Unknown time  . triamcinolone cream (KENALOG) 0.1 % Apply 1 application topically 2 (two) times daily. Avoid face and genitalia 45 g 2 unk  . warfarin (COUMADIN) 5 MG tablet Take 1 tablet (5 mg total) by mouth daily. 90 tablet 1 06/19/2020 at 2000  . glucose blood (ONETOUCH VERIO) test strip USE TO TEST TWICE A DAY 200 strip 3   . Misc Natural Products (OSTEO BI-FLEX ADV TRIPLE ST) TABS Take 1 tablet by mouth 2 (two) times a week.      . nitroGLYCERIN (NITROSTAT) 0.4 MG SL tablet Place 1 tablet (0.4 mg total) under the tongue every 5 (five) minutes as needed for chest pain. 50 tablet 10   . OneTouch Delica Lancets 63W MISC 1 each by Other route 2 (two) times daily. Dx E11.9 200 each 3   . pravastatin (PRAVACHOL) 80 MG tablet Take 1 tablet (80 mg total) by mouth daily. 90 tablet 1     Assessment: Patient with chest pain concerning for unstable angina. He Is chronically anticoagulated with Coumadin for mechanical mitral valve and afib. Plan is to proceed with R/L HC when INR < 1.7.  Currently INR is 2.6.Last dose of coumadin was 10/10 at 2000.  Pharmacy to start heparin at 1500 units/hr when INR < 2.5.  INR 2.4, will start heparin infusion without bolus  Goal of Therapy:  Heparin level 0.3-0.7 units/ml Monitor platelets by anticoagulation protocol: Yes   Plan:  Start heparin infusion at 1500 units/hr when INR < 2.5 Daily PT-INR and CBC Monitor anti-xa levels in ~6-8 hrs and daily while on heparin Hold coumadin for now Monitor for S/S of bleeding  Isac Sarna, BS Vena [place], BCPS Clinical Pharmacist Pager [phone] 06/22/2020,8:54 AM

## 2020-06-22 NOTE — Progress Notes (Signed)
[place] for heparin when INR <2.5 Indication: mechanical mitral valve/afib  Allergies  Allergen Reactions  . Doxycycline Hives    Patient Measurements: Height: 5\' 11"  (180.3 cm) Weight: 94.2 kg (207 lb 9.6 oz) IBW/kg (Calculated) : 75.3 HEPARIN DW (KG): 94.1  Vital Signs: Temp: 98 F (36.7 C) (10/13 1929) Temp Source: Oral (10/13 1929) BP: 109/74 (10/13 1929) Pulse Rate: 70 (10/13 1929)  Labs: Recent Labs    06/20/20 1207 06/20/20 1316 06/20/20 1448 06/21/20 1010 06/22/20 0618 06/22/20 1528 06/22/20 2223  HGB 13.3  --   --   --  13.0  --   --   HCT 40.9  --   --   --  40.4  --   --   PLT 184  --   --   --  192  --   --   LABPROT  --  27.3*  --  27.2* 25.2*  --   --   INR  --  2.6*  --  2.6* 2.4*  --   --   HEPARINUNFRC  --   --   --   --   --  0.46 0.72*  CREATININE 1.51*  --   --  1.44* 1.37*  --   --   TROPONINIHS 14  --  16  --   --   --   --     Estimated Creatinine Clearance: 61.4 mL/min (A) (by C-G formula based on SCr of 1.37 mg/dL (H)).   Assessment: Patient with chest pain concerning for unstable angina. He Is chronically anticoagulated with Coumadin for mechanical mitral valve and afib. Plan is to proceed with R/L HC when INR < 1.7.  Coumadin on hold and utilizing heparin bridge.  Heparin level slightly supratherapeutic (0.72) on gtt at 1500 units/hr. No bleeding noted.  Goal of Therapy:  Heparin level 0.3-0.7 units/ml Monitor platelets by anticoagulation protocol: Yes   Plan:  Decreas heparin infusion to 1400 units/hr  Will f/u daily heparin level  Sherlon Handing, PharmD, BCPS Please see amion for complete clinical pharmacist phone list 06/22/2020,11:36 PM

## 2020-06-22 NOTE — Progress Notes (Signed)
2029: text paged on call cardiology DR. [place]ier notifying patient's arrival.  2316: didn't hear back from cardiology, and wasn't here to see patient, paged back . MD called back and stated she dont need to come see him as same group saw him in [facility].  2348: paged md back again as pt wanted explanation on heart cath before signing consent. MD stated somebody will come talk to him in am. Will continue to monitor.

## 2020-06-22 NOTE — Progress Notes (Signed)
Pt received from [facility] via care link. Report received per care link at bedside. Pt ao x4. Denies any chest pain. Heparin drip running at 53ml/hr. Vitals stable at the time of arrival. CHG bath completed, connected to tele and CCMD notified. Oriented pt to room and call bell system. Call bell within reach. Will continue to monitor.

## 2020-06-23 ENCOUNTER — Encounter (HOSPITAL_COMMUNITY)
Admission: EM | Disposition: A | Discharge status: Home / Self Care | Payer: Self-pay | Source: Home / Self Care | Attending: Cardiovascular Disease

## 2020-06-23 ENCOUNTER — Ambulatory Visit (HOSPITAL_COMMUNITY): Admission: RE | Admit: 2020-06-23 | Payer: PPO | Source: Home / Self Care | Admitting: Cardiovascular Disease

## 2020-06-23 DIAGNOSIS — I25110 Atherosclerotic heart disease of native coronary artery with unstable angina pectoris: Secondary | ICD-10-CM | POA: Diagnosis not present

## 2020-06-23 DIAGNOSIS — R079 Chest pain, unspecified: Secondary | ICD-10-CM | POA: Diagnosis not present

## 2020-06-23 DIAGNOSIS — I350 Nonrheumatic aortic (valve) stenosis: Secondary | ICD-10-CM

## 2020-06-23 DIAGNOSIS — I4821 Permanent atrial fibrillation: Secondary | ICD-10-CM | POA: Diagnosis not present

## 2020-06-23 DIAGNOSIS — I25118 Atherosclerotic heart disease of native coronary artery with other forms of angina pectoris: Secondary | ICD-10-CM

## 2020-06-23 DIAGNOSIS — I10 Essential (primary) hypertension: Secondary | ICD-10-CM | POA: Diagnosis not present

## 2020-06-23 HISTORY — PX: RIGHT/LEFT HEART CATH AND CORONARY/GRAFT ANGIOGRAPHY: CATH118267

## 2020-06-23 LAB — POCT I-STAT EG7
Acid-Base Excess: 0 mmol/L (ref 0.0–2.0)
Bicarbonate: 26.4 mmol/L (ref 20.0–28.0)
Calcium, Ion: 1.22 mmol/L (ref 1.15–1.40)
HCT: 37 % — ABNORMAL LOW (ref 39.0–52.0)
Hemoglobin: 12.6 g/dL — ABNORMAL LOW (ref 13.0–17.0)
O2 Saturation: 64 %
Potassium: 4.2 mmol/L (ref 3.5–5.1)
Sodium: 143 mmol/L (ref 135–145)
TCO2: 28 mmol/L (ref 22–32)
pCO2, Ven: 46.8 mmHg (ref 44.0–60.0)
pH, Ven: 7.359 (ref 7.250–7.430)
pO2, Ven: 35 mmHg (ref 32.0–45.0)

## 2020-06-23 LAB — POCT I-STAT 7, (LYTES, BLD GAS, ICA,H+H)
Acid-Base Excess: 0 mmol/L (ref 0.0–2.0)
Bicarbonate: 25.2 mmol/L (ref 20.0–28.0)
Calcium, Ion: 1.24 mmol/L (ref 1.15–1.40)
HCT: 36 % — ABNORMAL LOW (ref 39.0–52.0)
Hemoglobin: 12.2 g/dL — ABNORMAL LOW (ref 13.0–17.0)
O2 Saturation: 99 %
Potassium: 4.2 mmol/L (ref 3.5–5.1)
Sodium: 142 mmol/L (ref 135–145)
TCO2: 26 mmol/L (ref 22–32)
pCO2 arterial: 43.1 mmHg (ref 32.0–48.0)
pH, Arterial: 7.375 (ref 7.350–7.450)
pO2, Arterial: 133 mmHg — ABNORMAL HIGH (ref 83.0–108.0)

## 2020-06-23 LAB — BASIC METABOLIC PANEL
Anion gap: 8 (ref 5–15)
BUN: 49 mg/dL — ABNORMAL HIGH (ref 8–23)
CO2: 25 mmol/L (ref 22–32)
Calcium: 8.7 mg/dL — ABNORMAL LOW (ref 8.9–10.3)
Chloride: 107 mmol/L (ref 98–111)
Creatinine, Ser: 1.54 mg/dL — ABNORMAL HIGH (ref 0.61–1.24)
GFR, Estimated: 46 mL/min — ABNORMAL LOW (ref >60)
Glucose, Bld: 141 mg/dL — ABNORMAL HIGH (ref 70–99)
Potassium: 4.2 mmol/L (ref 3.5–5.1)
Sodium: 140 mmol/L (ref 135–145)

## 2020-06-23 LAB — PROTIME-INR
INR: 2.1 — ABNORMAL HIGH (ref 0.8–1.2)
INR: 1.7 — ABNORMAL HIGH (ref 0.8–1.2)
Prothrombin Time: 22.6 seconds — ABNORMAL HIGH (ref 11.4–15.2)
Prothrombin Time: 19.1 seconds — ABNORMAL HIGH (ref 11.4–15.2)

## 2020-06-23 LAB — GLUCOSE, CAPILLARY
Glucose-Capillary: 119 mg/dL — ABNORMAL HIGH (ref 70–99)
Glucose-Capillary: 185 mg/dL — ABNORMAL HIGH (ref 70–99)
Glucose-Capillary: 125 mg/dL — ABNORMAL HIGH (ref 70–99)

## 2020-06-23 LAB — CBC
HCT: 37.1 % — ABNORMAL LOW (ref 39.0–52.0)
Hemoglobin: 11.9 g/dL — ABNORMAL LOW (ref 13.0–17.0)
MCH: 28.7 pg (ref 26.0–34.0)
MCHC: 32.1 g/dL (ref 30.0–36.0)
MCV: 89.4 fL (ref 80.0–100.0)
Platelets: 164 10*3/uL (ref 150–400)
RBC: 4.15 MIL/uL — ABNORMAL LOW (ref 4.22–5.81)
RDW: 14.4 % (ref 11.5–15.5)
WBC: 5.6 10*3/uL (ref 4.0–10.5)
nRBC: 0 % (ref 0.0–0.2)

## 2020-06-23 LAB — HEPARIN LEVEL (UNFRACTIONATED): Heparin Unfractionated: 0.61 IU/mL (ref 0.30–0.70)

## 2020-06-23 SURGERY — RIGHT/LEFT HEART CATH AND CORONARY/GRAFT ANGIOGRAPHY
Anesthesia: LOCAL

## 2020-06-23 MED ORDER — SODIUM CHLORIDE 0.9% FLUSH: 3.0000 mL | INTRAVENOUS | Status: DC | PRN

## 2020-06-23 MED ORDER — SODIUM CHLORIDE 0.9 % IV SOLN: INTRAVENOUS | Status: DC

## 2020-06-23 MED ORDER — ASPIRIN 81 MG PO CHEW: 81.0000 mg | CHEWABLE_TABLET | ORAL | Status: DC

## 2020-06-23 MED ORDER — HYDRALAZINE HCL 20 MG/ML IJ SOLN: 10.0000 mg | INTRAMUSCULAR | Status: AC | PRN

## 2020-06-23 MED ORDER — HEPARIN (PORCINE) IN NACL 1000-0.9 UT/500ML-% IV SOLN
INTRAVENOUS | Status: DC | PRN
  Administered 2020-06-23: 500 mL

## 2020-06-23 MED ORDER — SODIUM CHLORIDE 0.9 % IV SOLN: 250.0000 mL | INTRAVENOUS | Status: DC | PRN

## 2020-06-23 MED ORDER — ACETAMINOPHEN 325 MG PO TABS: 650.0000 mg | ORAL_TABLET | ORAL | Status: DC | PRN

## 2020-06-23 MED ORDER — LABETALOL HCL 5 MG/ML IV SOLN: 10.0000 mg | INTRAVENOUS | Status: AC | PRN

## 2020-06-23 MED ORDER — MIDAZOLAM HCL 2 MG/2ML IJ SOLN
INTRAMUSCULAR | Status: DC | PRN
  Administered 2020-06-23: 1 mg via INTRAVENOUS

## 2020-06-23 MED ORDER — SODIUM CHLORIDE 0.9 % IV SOLN: INTRAVENOUS | Status: AC

## 2020-06-23 MED ORDER — DIAZEPAM 5 MG PO TABS
5.0000 mg | ORAL_TABLET | Freq: Four times a day (QID) | ORAL | Status: DC | PRN
  Administered 2020-06-25: 5 mg via ORAL
  Filled 2020-06-23: qty 1

## 2020-06-23 MED ORDER — FENTANYL CITRATE (PF) 100 MCG/2ML IJ SOLN
INTRAMUSCULAR | Status: DC | PRN
Start: 2020-06-23 — End: 2020-06-23
  Administered 2020-06-23: 50 ug via INTRAVENOUS

## 2020-06-23 MED ORDER — LIDOCAINE HCL (PF) 1 % IJ SOLN
INTRAMUSCULAR | Status: DC | PRN
  Administered 2020-06-23: 15 mL

## 2020-06-23 MED ORDER — ONDANSETRON HCL 4 MG/2ML IJ SOLN: 4.0000 mg | Freq: Four times a day (QID) | INTRAMUSCULAR | Status: DC | PRN

## 2020-06-23 MED ORDER — IOHEXOL 350 MG/ML SOLN
INTRAVENOUS | Status: DC | PRN
  Administered 2020-06-23: 100 mL

## 2020-06-23 MED ORDER — SODIUM CHLORIDE 0.9% FLUSH: 3.0000 mL | Freq: Two times a day (BID) | INTRAVENOUS | Status: DC

## 2020-06-23 MED ORDER — SODIUM CHLORIDE 0.9% FLUSH
3.0000 mL | Freq: Two times a day (BID) | INTRAVENOUS | Status: DC
  Administered 2020-06-23 – 2020-06-24 (×4): 3 mL via INTRAVENOUS

## 2020-06-23 MED ORDER — ASPIRIN 81 MG PO CHEW
81.0000 mg | CHEWABLE_TABLET | ORAL | Status: AC
  Administered 2020-06-23: 81 mg via ORAL
  Filled 2020-06-23: qty 1

## 2020-06-23 MED ORDER — HEPARIN (PORCINE) 25000 UT/250ML-% IV SOLN
1400.0000 [IU]/h | INTRAVENOUS | Status: DC
  Administered 2020-06-24: 1150 [IU]/h via INTRAVENOUS
  Filled 2020-06-23: qty 250

## 2020-06-23 SURGICAL SUPPLY — 13 items
CATH INFINITI 5FR MULTPACK ANG (CATHETERS) ×1 IMPLANT
CATH SWAN GANZ 7F STRAIGHT (CATHETERS) ×1 IMPLANT
CLOSURE MYNX CONTROL 5F (Vascular Products) ×1 IMPLANT
CLOSURE MYNX CONTROL 6F/7F (Vascular Products) ×1 IMPLANT
KIT HEART LEFT (KITS) ×2 IMPLANT
PACK CARDIAC CATHETERIZATION (CUSTOM PROCEDURE TRAY) ×2 IMPLANT
SHEATH PINNACLE 5F 10CM (SHEATH) ×1 IMPLANT
SHEATH PINNACLE 7F 10CM (SHEATH) ×1 IMPLANT
SHEATH PROBE COVER 6X72 (BAG) ×1 IMPLANT
TRANSDUCER W/STOPCOCK (MISCELLANEOUS) ×2 IMPLANT
TUBING CIL FLEX 10 FLL-RA (TUBING) ×2 IMPLANT
WIRE EMERALD 3MM-J .025X260CM (WIRE) ×1 IMPLANT
WIRE EMERALD 3MM-J .035X150CM (WIRE) ×1 IMPLANT

## 2020-06-23 NOTE — Progress Notes (Signed)
Pt received from cath lab. R groin level 0. Pt educated  On bedrest. Call light in reach.  Clyde Canterbury, RN

## 2020-06-23 NOTE — Progress Notes (Signed)
Per [employer] RN, pt can eat breakfast. Pt ordering light breakfast. Requested jennifer RN to come talk to pt so he can sigh consent. Will continue to monitor.

## 2020-06-23 NOTE — Progress Notes (Signed)
[place] for IV Heparin Indication: mechanical mitral valve/afib  Allergies  Allergen Reactions  . Doxycycline Hives    Patient Measurements: Height: 5\' 11"  (180.3 cm) Weight: 93.5 kg (206 lb 3.2 oz) IBW/kg (Calculated) : 75.3 HEPARIN DW (KG): 94.1  Vital Signs: Temp: 98.4 F (36.9 C) (10/14 1712) Temp Source: Oral (10/14 1712) BP: 104/63 (10/14 1712) Pulse Rate: 69 (10/14 1712)  Labs: Recent Labs    06/21/20 1010 06/21/20 1010 06/22/20 0618 06/22/20 0618 06/22/20 1528 06/22/20 2223 06/23/20 0135 06/23/20 0143 06/23/20 0143 06/23/20 1411 06/23/20 1602 06/23/20 1611  HGB  --   --  13.0   < >  --   --   --  11.9*   < >  --  12.6* 12.2*  HCT  --   --  40.4   < >  --   --   --  37.1*  --   --  37.0* 36.0*  PLT  --   --  192  --   --   --   --  164  --   --   --   --   LABPROT 27.2*   < > 25.2*  --   --   --   --  22.6*  --  19.1*  --   --   INR 2.6*   < > 2.4*  --   --   --   --  2.1*  --  1.7*  --   --   HEPARINUNFRC  --   --   --   --  0.46 0.72* 0.61  --   --   --   --   --   CREATININE 1.44*  --  1.37*  --   --   --  1.54*  --   --   --   --   --    < > = values in this interval not displayed.    Estimated Creatinine Clearance: 54.4 mL/min (A) (by C-G formula based on SCr of 1.54 mg/dL (H)).   Assessment: 69 years of age male on Coumadin prior to admission for mechanical mitral valve and atrial fibrillation who presented with chest pain concerning for unstable angina. Coumadin was held on admission and patient was transitioned to IV Heparin for possible need for procedures.   This PM, patient is post- R/L cardiac cath with orders to resume IV Heparin 8 hours after sheath pull. Femoral sheath was pulled at 16:40 PM. INR earlier today was 1.7. Hgb is stable at 12.2. Platelets are within normal limits. No bleeding Prior to cath, heparin was at goal rate on 1400 units/hr (~14.8 units/kg/hr). Will start low with recent procedure  and stitched closure site. Per cath note, planning for PCI of circumflex ostial stenosis in near future.   Goal of Therapy:  Heparin level 0.3-0.7 units/ml Monitor platelets by anticoagulation protocol: Yes   Plan:  Resume IV Heparin at 0040 AM - 8 hours after sheath removal - at a rate of 1150 units/hr.  Heparin level 6-8 hours after restart.  Daily heparin level and CBC while on therapy.  Follow-up plan for PCI.   Sloan Leiter, PharmD, BCPS, BCCCP Clinical Pharmacist Please refer to [facility] for [place] numbers 06/23/2020, 5:42 PM

## 2020-06-23 NOTE — Progress Notes (Signed)
Mobility Specialist - Progress Note   06/23/20 1355  Mobility  Activity Contraindicated/medical hold   Pt currently w/ bed rest orders.  Pricilla Handler Mobility Specialist Mobility Specialist Phone: [phone]

## 2020-06-23 NOTE — Plan of Care (Signed)

## 2020-06-23 NOTE — Progress Notes (Signed)
Progress Note  Patient Name: John [name] Date of Encounter: 06/23/2020  [facility] Cardiologist: Minus Breeding, MD   Subjective   Pt feels well this morning, biggest complaint is that he is cold. No current chest pain. He describes exertional SOB when pushing a lawnmower.   Inpatient Medications    Scheduled Meds: . Chlorhexidine Gluconate Cloth  6 each Topical Daily  . furosemide  20 mg Intravenous BID  . insulin aspart  0-15 Units Subcutaneous TID WC  . insulin aspart  0-5 Units Subcutaneous QHS  . isosorbide mononitrate  30 mg Oral Daily  . metoprolol  200 mg Oral QPC breakfast  . pantoprazole  40 mg Oral Daily  . rosuvastatin  40 mg Oral q1800  . sacubitril-valsartan  1 tablet Oral BID  . sodium chloride flush  3 mL Intravenous Q12H   Continuous Infusions: . sodium chloride    . sodium chloride 10 mL/hr at 06/23/20 0634  . heparin 1,400 Units/hr (06/23/20 0213)   PRN Meds: sodium chloride, acetaminophen, ondansetron (ZOFRAN) IV, sodium chloride flush, zolpidem   Vital Signs    Vitals:   06/22/20 1929 06/23/20 0011 06/23/20 0428 06/23/20 0822  BP: 109/74 114/63 110/62 113/69  Pulse: 70 66 73 75  Resp:  15 16 18   Temp: 98 F (36.7 C) 97.7 F (36.5 C) 97.6 F (36.4 C) 97.6 F (36.4 C)  TempSrc: Oral Oral Oral Oral  SpO2: 99% 97% 99% 95%  Weight: 94.2 kg  93.5 kg   Height: 5\' 11"  (1.803 m)       Intake/Output Summary (Last 24 hours) at 06/23/2020 0952 Last data filed at 06/23/2020 0823 Gross per 24 hour  Intake 370 ml  Output --  Net 370 ml   Last 3 Weights 06/23/2020 06/22/2020 06/20/2020  Weight (lbs) 206 lb 3.2 oz 207 lb 9.6 oz 220 lb  Weight (kg) 93.532 kg 94.167 kg 99.791 kg      Telemetry    Afib with CVR in the 60-70s - Personally Reviewed  ECG    No new tracings - Personally Reviewed  Physical Exam   GEN: No acute distress.   Neck: No JVD Cardiac: RRR, no murmurs, rubs, or gallops.  Respiratory: Clear to auscultation  bilaterally. GI: Soft, nontender, non-distended  MS: No edema; No deformity. Neuro:  Nonfocal  Psych: Normal affect   Labs    High Sensitivity Troponin:   Recent Labs  Lab 06/20/20 1207 06/20/20 1448  TROPONINIHS 14 16      Chemistry Recent Labs  Lab 06/21/20 1010 06/22/20 0618 06/23/20 0135  NA 139 140 140  K 4.4 4.6 4.2  CL 107 108 107  CO2 22 23 25   GLUCOSE 156* 127* 141*  BUN 56* 52* 49*  CREATININE 1.44* 1.37* 1.54*  CALCIUM 9.0 9.0 8.7*  GFRNONAA 50* 53* 46*  ANIONGAP 10 9 8      Hematology Recent Labs  Lab 06/20/20 1207 06/22/20 0618 06/23/20 0143  WBC 6.5 6.2 5.6  RBC 4.52 4.53 4.15*  HGB 13.3 13.0 11.9*  HCT 40.9 40.4 37.1*  MCV 90.5 89.2 89.4  MCH 29.4 28.7 28.7  MCHC 32.5 32.2 32.1  RDW 14.6 14.4 14.4  PLT 184 192 164    BNP Recent Labs  Lab 06/20/20 1207  BNP 2,291.0*     DDimer No results for input(s): DDIMER in the last 168 hours.   Radiology    No results found.  Cardiac Studies   Cardiac Catheterization: 04/2017  Mid  RCA to Dist RCA lesion, 75 %stenosed.  Prox LAD lesion, 70 %stenosed.  Mid LAD lesion, 100 %stenosed.  2nd Mrg lesion, 30 %stenosed.  Ost Cx lesion, 60 %stenosed.  SVG.  Origin lesion, 100 %stenosed.  LIMA graft was visualized by angiography and is large and anatomically normal.  LV end diastolic pressure is moderately elevated.  Hemodynamic findings consistent with severe pulmonary hypertension.  1. Severe 2 vessel obstructive CAD.  - 100% LAD after the first diagonal - Long 75% mid to distal RCA 2. Patent LIMA to the LAD 3. Occluded SVG to the RCA 4. Moderate to severe pulmonary HTN 5. Elevated LV filling pressures without significant MV gradient.   Echocardiogram 12/23/2019: 1. EF remains severely depressed to slightly worse than echo done October  2019 . Left ventricular ejection fraction, by estimation, is 25 to 30%.  The left ventricle has severely decreased function. The left  ventricle  demonstrates global hypokinesis. The  left ventricular internal cavity size was severely dilated. There is mild  left ventricular hypertrophy. Left ventricular diastolic parameters are  indeterminate.  2. Right ventricular systolic function is normal. The right ventricular  size is normal. There is moderately elevated pulmonary artery systolic  pressure.  3. Left atrial size was severely dilated.  4. Normal appearing mechanical bileaflet mitral valve replacement with no  PVL . The mitral valve has been repaired/replaced. No evidence of mitral  valve regurgitation.  5. AS and mean gradient stable since echo done 07/03/18. The aortic valve  has an indeterminant number of cusps. Aortic valve regurgitation is mild.  Mild aortic valve stenosis.   Patient Profile     69 y.o. male with a history of CAD (s/p CABG in 2002 with cath in 04/2017 showing patent LIMA-LAD and occluded SVG-RCA with flow through native RCA with 75% mid-distal stenosis and medical management recommended initially given the long segment of stenosis), chronic combined systolic and diastolic CHF/ICM (EF 41-58% by echo in 12/2019, s/p St. Jude ICD placement by Dr. Lovena Le in 02/2020), St. Jude mechanical MVR in 2002, persistent atrial fibrillation, HTN and HLD who presented to [facility] ED on 06/20/2020 from PCP's office for evaluation of progressive chest pain for the past few weeks.  Assessment & Plan    Unstable angina Hx of CAD s/p CABG (2002) - last heart cath 2018 with patent LIMA-LAD, occluded SVG-RCA with medical management - hs troponin  - plan for diagnostic right and left heart cath --> will recheck INR at 2pm and if closer to 1.7 will plan for cath today   Acute on chronic systolic and diastolic heart failure ICD in place - echo 12/2019 with EF of 25-30% - pt appears euvolemic - continue toprol, entresto, imdur, and crestor - on 20 mg lasix IV BID   Permanent Afib - coumadin on hold,  recheck INR at 1.7 - start heparin drip when INR subtherapeutic - rate controlled   Hx of mechanical mitral valve - 2002 - coumadin on hold   CKD stage IIIb - sCr today 1.54 - baseline appears to be 1.5   For questions or updates, please contact [facility] Please consult [email or web address] for contact info under        Signed, Ledora Bottcher, PA  06/23/2020, 9:52 AM

## 2020-06-23 NOTE — Progress Notes (Signed)
[place] for heparin when INR <2.5 Indication: mechanical mitral valve/afib  Allergies  Allergen Reactions  . Doxycycline Hives    Patient Measurements: Height: 5\' 11"  (180.3 cm) Weight: 93.5 kg (206 lb 3.2 oz) IBW/kg (Calculated) : 75.3 HEPARIN DW (KG): 94.1  Vital Signs: Temp: 97.6 F (36.4 C) (10/14 0822) Temp Source: Oral (10/14 0822) BP: 113/69 (10/14 0822) Pulse Rate: 75 (10/14 0822)  Labs: Recent Labs    06/20/20 1207 06/20/20 1207 06/20/20 1316 06/20/20 1448 06/21/20 1010 06/22/20 0618 06/22/20 1528 06/22/20 2223 06/23/20 0135 06/23/20 0143  HGB 13.3   < >  --   --   --  13.0  --   --   --  11.9*  HCT 40.9  --   --   --   --  40.4  --   --   --  37.1*  PLT 184  --   --   --   --  192  --   --   --  164  LABPROT  --   --    < >  --  27.2* 25.2*  --   --   --  22.6*  INR  --   --    < >  --  2.6* 2.4*  --   --   --  2.1*  HEPARINUNFRC  --   --   --   --   --   --  0.46 0.72* 0.61  --   CREATININE 1.51*  --    < >  --  1.44* 1.37*  --   --  1.54*  --   TROPONINIHS 14  --   --  16  --   --   --   --   --   --    < > = values in this interval not displayed.    Estimated Creatinine Clearance: 54.4 mL/min (A) (by C-G formula based on SCr of 1.54 mg/dL (H)).   Assessment: Patient with chest pain concerning for unstable angina. He Is chronically anticoagulated with Coumadin for mechanical mitral valve and afib. Plan is to proceed with R/L HC when INR < 1.7.  Coumadin on hold and utilizing heparin bridge. -heparin level at goal    Goal of Therapy:  Heparin level 0.3-0.7 units/ml Monitor platelets by anticoagulation protocol: Yes   Plan:  Continue heparin at 1400 units/hr Daily heparin level and CBC  Hildred Laser, PharmD Clinical Pharmacist **Pharmacist phone directory can now be found on [email or web address] ([email or web address]).  Listed under [place].

## 2020-06-24 ENCOUNTER — Encounter (HOSPITAL_COMMUNITY)
Admission: EM | Disposition: A | Discharge status: Home / Self Care | Payer: Self-pay | Source: Home / Self Care | Attending: Cardiovascular Disease

## 2020-06-24 ENCOUNTER — Encounter (HOSPITAL_COMMUNITY): Payer: Self-pay | Admitting: Cardiovascular Disease

## 2020-06-24 DIAGNOSIS — I25110 Atherosclerotic heart disease of native coronary artery with unstable angina pectoris: Secondary | ICD-10-CM | POA: Diagnosis not present

## 2020-06-24 DIAGNOSIS — I200 Unstable angina: Secondary | ICD-10-CM | POA: Diagnosis not present

## 2020-06-24 DIAGNOSIS — I350 Nonrheumatic aortic (valve) stenosis: Secondary | ICD-10-CM

## 2020-06-24 DIAGNOSIS — R079 Chest pain, unspecified: Secondary | ICD-10-CM | POA: Diagnosis not present

## 2020-06-24 HISTORY — PX: CORONARY BALLOON ANGIOPLASTY: CATH118233

## 2020-06-24 HISTORY — PX: CORONARY STENT INTERVENTION: CATH118234

## 2020-06-24 HISTORY — PX: CORONARY ATHERECTOMY: CATH118238

## 2020-06-24 LAB — POCT I-STAT EG7
Acid-Base Excess: 1 mmol/L (ref 0.0–2.0)
Bicarbonate: 27.1 mmol/L (ref 20.0–28.0)
Calcium, Ion: 1.23 mmol/L (ref 1.15–1.40)
HCT: 37 % — ABNORMAL LOW (ref 39.0–52.0)
Hemoglobin: 12.6 g/dL — ABNORMAL LOW (ref 13.0–17.0)
O2 Saturation: 65 %
Potassium: 4.3 mmol/L (ref 3.5–5.1)
Sodium: 143 mmol/L (ref 135–145)
TCO2: 28 mmol/L (ref 22–32)
pCO2, Ven: 47.9 mmHg (ref 44.0–60.0)
pH, Ven: 7.36 (ref 7.250–7.430)
pO2, Ven: 36 mmHg (ref 32.0–45.0)

## 2020-06-24 LAB — CBC
HCT: 37.3 % — ABNORMAL LOW (ref 39.0–52.0)
Hemoglobin: 11.9 g/dL — ABNORMAL LOW (ref 13.0–17.0)
MCH: 28.6 pg (ref 26.0–34.0)
MCHC: 31.9 g/dL (ref 30.0–36.0)
MCV: 89.7 fL (ref 80.0–100.0)
Platelets: 151 10*3/uL (ref 150–400)
RBC: 4.16 MIL/uL — ABNORMAL LOW (ref 4.22–5.81)
RDW: 14.5 % (ref 11.5–15.5)
WBC: 7 10*3/uL (ref 4.0–10.5)
nRBC: 0 % (ref 0.0–0.2)

## 2020-06-24 LAB — BASIC METABOLIC PANEL
Anion gap: 11 (ref 5–15)
BUN: 45 mg/dL — ABNORMAL HIGH (ref 8–23)
CO2: 23 mmol/L (ref 22–32)
Calcium: 8.7 mg/dL — ABNORMAL LOW (ref 8.9–10.3)
Chloride: 105 mmol/L (ref 98–111)
Creatinine, Ser: 1.61 mg/dL — ABNORMAL HIGH (ref 0.61–1.24)
GFR, Estimated: 44 mL/min — ABNORMAL LOW (ref >60)
Glucose, Bld: 209 mg/dL — ABNORMAL HIGH (ref 70–99)
Potassium: 4.2 mmol/L (ref 3.5–5.1)
Sodium: 139 mmol/L (ref 135–145)

## 2020-06-24 LAB — PROTIME-INR
INR: 1.8 — ABNORMAL HIGH (ref 0.8–1.2)
Prothrombin Time: 19.8 seconds — ABNORMAL HIGH (ref 11.4–15.2)

## 2020-06-24 LAB — POCT ACTIVATED CLOTTING TIME
Activated Clotting Time: 285 seconds
Activated Clotting Time: 301 seconds
Activated Clotting Time: 285 seconds

## 2020-06-24 LAB — HEPARIN LEVEL (UNFRACTIONATED): Heparin Unfractionated: 0.15 IU/mL — ABNORMAL LOW (ref 0.30–0.70)

## 2020-06-24 LAB — GLUCOSE, CAPILLARY
Glucose-Capillary: 161 mg/dL — ABNORMAL HIGH (ref 70–99)
Glucose-Capillary: 138 mg/dL — ABNORMAL HIGH (ref 70–99)
Glucose-Capillary: 219 mg/dL — ABNORMAL HIGH (ref 70–99)

## 2020-06-24 SURGERY — CORONARY STENT INTERVENTION
Anesthesia: LOCAL

## 2020-06-24 MED ORDER — FENTANYL CITRATE (PF) 100 MCG/2ML IJ SOLN
INTRAMUSCULAR | Status: AC
  Filled 2020-06-24: qty 2

## 2020-06-24 MED ORDER — ASPIRIN 81 MG PO CHEW
81.0000 mg | CHEWABLE_TABLET | Freq: Once | ORAL | Status: AC
  Administered 2020-06-24: 81 mg via ORAL
  Filled 2020-06-24: qty 1

## 2020-06-24 MED ORDER — ONDANSETRON HCL 4 MG/2ML IJ SOLN: 4.0000 mg | Freq: Four times a day (QID) | INTRAMUSCULAR | Status: DC | PRN

## 2020-06-24 MED ORDER — LIDOCAINE HCL (PF) 1 % IJ SOLN
INTRAMUSCULAR | Status: DC | PRN
  Administered 2020-06-24: 2 mL
  Administered 2020-06-24: 10 mL

## 2020-06-24 MED ORDER — HEPARIN (PORCINE) IN NACL 1000-0.9 UT/500ML-% IV SOLN
INTRAVENOUS | Status: AC
  Filled 2020-06-24: qty 1000

## 2020-06-24 MED ORDER — HEPARIN SODIUM (PORCINE) 1000 UNIT/ML IJ SOLN
INTRAMUSCULAR | Status: AC
  Filled 2020-06-24: qty 1

## 2020-06-24 MED ORDER — HYDRALAZINE HCL 20 MG/ML IJ SOLN: 10.0000 mg | INTRAMUSCULAR | Status: AC | PRN

## 2020-06-24 MED ORDER — SODIUM CHLORIDE 0.9% FLUSH: 3.0000 mL | INTRAVENOUS | Status: DC | PRN

## 2020-06-24 MED ORDER — VIPERSLIDE LUBRICANT OPTIME
TOPICAL | Status: DC | PRN
  Administered 2020-06-24: 1000 mL via SURGICAL_CAVITY

## 2020-06-24 MED ORDER — SODIUM CHLORIDE 0.9% FLUSH
3.0000 mL | Freq: Two times a day (BID) | INTRAVENOUS | Status: DC
  Administered 2020-06-24: 3 mL via INTRAVENOUS

## 2020-06-24 MED ORDER — SODIUM CHLORIDE 0.9 % IV SOLN: INTRAVENOUS | Status: AC

## 2020-06-24 MED ORDER — NITROGLYCERIN 1 MG/10 ML FOR IR/CATH LAB
INTRA_ARTERIAL | Status: DC | PRN
  Administered 2020-06-24 (×2): 100 ug via INTRACORONARY
  Administered 2020-06-24: 200 ug via INTRACORONARY

## 2020-06-24 MED ORDER — IOHEXOL 350 MG/ML SOLN
INTRAVENOUS | Status: AC
  Filled 2020-06-24: qty 1

## 2020-06-24 MED ORDER — HEPARIN (PORCINE) IN NACL 1000-0.9 UT/500ML-% IV SOLN
INTRAVENOUS | Status: DC | PRN
  Administered 2020-06-24 (×2): 500 mL

## 2020-06-24 MED ORDER — WARFARIN SODIUM 7.5 MG PO TABS
7.5000 mg | ORAL_TABLET | Freq: Once | ORAL | Status: AC
  Administered 2020-06-24: 7.5 mg via ORAL
  Filled 2020-06-24: qty 1

## 2020-06-24 MED ORDER — MIDAZOLAM HCL 2 MG/2ML IJ SOLN
INTRAMUSCULAR | Status: AC
  Filled 2020-06-24: qty 2

## 2020-06-24 MED ORDER — LIDOCAINE HCL (PF) 1 % IJ SOLN
INTRAMUSCULAR | Status: AC
  Filled 2020-06-24: qty 30

## 2020-06-24 MED ORDER — HEPARIN (PORCINE) 25000 UT/250ML-% IV SOLN
1600.0000 [IU]/h | INTRAVENOUS | Status: DC
  Administered 2020-06-25: 1600 [IU]/h via INTRAVENOUS
  Administered 2020-06-25: 1400 [IU]/h via INTRAVENOUS
  Filled 2020-06-24 (×2): qty 250

## 2020-06-24 MED ORDER — CLOPIDOGREL BISULFATE 75 MG PO TABS
75.0000 mg | ORAL_TABLET | Freq: Every day | ORAL | Status: DC
  Administered 2020-06-25 – 2020-07-01 (×7): 75 mg via ORAL
  Filled 2020-06-24 (×7): qty 1

## 2020-06-24 MED ORDER — IOHEXOL 350 MG/ML SOLN
INTRAVENOUS | Status: DC | PRN
  Administered 2020-06-24: 100 mL

## 2020-06-24 MED ORDER — VERAPAMIL HCL 2.5 MG/ML IV SOLN
INTRAVENOUS | Status: AC
  Filled 2020-06-24: qty 2

## 2020-06-24 MED ORDER — ACETAMINOPHEN 325 MG PO TABS
650.0000 mg | ORAL_TABLET | ORAL | Status: DC | PRN
  Administered 2020-06-27: 650 mg via ORAL
  Filled 2020-06-24: qty 2

## 2020-06-24 MED ORDER — WARFARIN - PHARMACIST DOSING INPATIENT: Freq: Every day | Status: DC

## 2020-06-24 MED ORDER — FENTANYL CITRATE (PF) 100 MCG/2ML IJ SOLN
INTRAMUSCULAR | Status: DC | PRN
Start: 2020-06-24 — End: 2020-06-24
  Administered 2020-06-24: 25 ug via INTRAVENOUS

## 2020-06-24 MED ORDER — SODIUM CHLORIDE 0.9 % IV SOLN: 250.0000 mL | INTRAVENOUS | Status: DC | PRN

## 2020-06-24 MED ORDER — HEPARIN SODIUM (PORCINE) 1000 UNIT/ML IJ SOLN
INTRAMUSCULAR | Status: DC | PRN
  Administered 2020-06-24 (×2): 2000 [IU] via INTRAVENOUS
  Administered 2020-06-24: 9000 [IU] via INTRAVENOUS

## 2020-06-24 MED ORDER — CLOPIDOGREL BISULFATE 75 MG PO TABS
600.0000 mg | ORAL_TABLET | ORAL | Status: AC
  Administered 2020-06-24: 600 mg via ORAL
  Filled 2020-06-24: qty 8

## 2020-06-24 MED ORDER — SODIUM CHLORIDE 0.9 % IV SOLN
INTRAVENOUS | Status: DC | PRN
  Administered 2020-06-24: 10 mL/h via INTRAVENOUS

## 2020-06-24 MED ORDER — SODIUM CHLORIDE 0.9 % IV SOLN: INTRAVENOUS | Status: DC

## 2020-06-24 MED ORDER — MIDAZOLAM HCL 2 MG/2ML IJ SOLN
INTRAMUSCULAR | Status: DC | PRN
  Administered 2020-06-24: 1 mg via INTRAVENOUS

## 2020-06-24 MED ORDER — SODIUM CHLORIDE 0.9% FLUSH
3.0000 mL | Freq: Two times a day (BID) | INTRAVENOUS | Status: DC
  Administered 2020-06-24 – 2020-06-30 (×10): 3 mL via INTRAVENOUS

## 2020-06-24 MED ORDER — VERAPAMIL HCL 2.5 MG/ML IV SOLN
INTRAVENOUS | Status: DC | PRN
  Administered 2020-06-24: 10 mL via INTRA_ARTERIAL

## 2020-06-24 MED ORDER — NITROGLYCERIN 1 MG/10 ML FOR IR/CATH LAB
INTRA_ARTERIAL | Status: AC
  Filled 2020-06-24: qty 10

## 2020-06-24 SURGICAL SUPPLY — 28 items
BALLN SAPPHIRE 2.5X15 (BALLOONS) ×2
BALLN SAPPHIRE ~~LOC~~ 2.0X12 (BALLOONS) ×2 IMPLANT
BALLN SAPPHIRE ~~LOC~~ 3.0X12 (BALLOONS) ×1 IMPLANT
BALLN SAPPHIRE ~~LOC~~ 3.25X8 (BALLOONS) ×1 IMPLANT
BALLOON SAPPHIRE 2.5X15 (BALLOONS) IMPLANT
CATH INFINITI JR4 5F (CATHETERS) ×1 IMPLANT
CATH LAUNCHER 6FR EBU 4 (CATHETERS) ×1 IMPLANT
CROWN DIAMONDBACK CLASSIC 1.25 (BURR) ×1 IMPLANT
DEVICE CLOSURE MYNXGRIP 6/7F (Vascular Products) ×1 IMPLANT
DEVICE RAD COMP TR BAND LRG (VASCULAR PRODUCTS) ×1 IMPLANT
ELECT DEFIB PAD ADLT CADENCE (PAD) ×1 IMPLANT
GLIDESHEATH SLEND SS 6F .021 (SHEATH) ×1 IMPLANT
KIT ENCORE 26 ADVANTAGE (KITS) ×2 IMPLANT
KIT HEART LEFT (KITS) ×2 IMPLANT
KIT HEMO VALVE WATCHDOG (MISCELLANEOUS) ×1 IMPLANT
KIT MICROPUNCTURE NIT STIFF (SHEATH) ×1 IMPLANT
LUBRICANT VIPERSLIDE CORONARY (MISCELLANEOUS) ×1 IMPLANT
PACK CARDIAC CATHETERIZATION (CUSTOM PROCEDURE TRAY) ×2 IMPLANT
SHEATH PINNACLE 6F 10CM (SHEATH) ×1 IMPLANT
SHEATH PROBE COVER 6X72 (BAG) ×1 IMPLANT
STENT RESOLUTE ONYX 2.75X26 (Permanent Stent) ×1 IMPLANT
TRANSDUCER W/STOPCOCK (MISCELLANEOUS) ×2 IMPLANT
TUBING CIL FLEX 10 FLL-RA (TUBING) ×2 IMPLANT
WIRE EMERALD 3MM-J .035X150CM (WIRE) ×1 IMPLANT
WIRE HI TORQ BMW 190CM (WIRE) ×1 IMPLANT
WIRE HI TORQ VERSACORE-J 145CM (WIRE) ×1 IMPLANT
WIRE RUNTHROUGH .014X180CM (WIRE) ×1 IMPLANT
WIRE VIPERWIRE COR FLEX .012 (WIRE) ×1 IMPLANT

## 2020-06-24 NOTE — Progress Notes (Addendum)
[place] for heparin + warfarin Indication: mechanical mitral valve/afib  Allergies  Allergen Reactions  . Doxycycline Hives    Patient Measurements: Height: 5\' 11"  (180.3 cm) Weight: 92.8 kg (204 lb 9.4 oz) IBW/kg (Calculated) : 75.3 HEPARIN DW (KG): 94.1  Vital Signs: Temp: 97.9 F (36.6 C) (10/15 1053) Temp Source: Oral (10/15 1053) BP: 106/57 (10/15 1712) Pulse Rate: 82 (10/15 1712)  Labs: Recent Labs    06/22/20 0618 06/22/20 0618 06/22/20 1528 06/22/20 2223 06/23/20 0135 06/23/20 0143 06/23/20 0143 06/23/20 1411 06/23/20 1602 06/23/20 1602 06/23/20 1611 06/24/20 0206 06/24/20 0918  HGB 13.0   < >  --   --   --  11.9*   < >  --  12.6*  12.6*   < > 12.2* 11.9*  --   HCT 40.4   < >  --   --   --  37.1*   < >  --  37.0*  37.0*  --  36.0* 37.3*  --   PLT 192  --   --   --   --  164  --   --   --   --   --  151  --   LABPROT 25.2*   < >  --   --   --  22.6*  --  19.1*  --   --   --  19.8*  --   INR 2.4*   < >  --   --   --  2.1*  --  1.7*  --   --   --  1.8*  --   HEPARINUNFRC  --   --    < > 0.72* 0.61  --   --   --   --   --   --   --  0.15*  CREATININE 1.37*  --   --   --  1.54*  --   --   --   --   --   --  1.61*  --    < > = values in this interval not displayed.    Estimated Creatinine Clearance: 51.8 mL/min (A) (by C-G formula based on SCr of 1.61 mg/dL (H)).   Assessment: Patient with chest pain concerning for unstable angina. He Is chronically anticoagulated with warfarin for mechanical mitral valve and afib. Plan is to proceed with R/L HC when INR < 1.7.  Coumadin on hold and utilizing heparin bridge.  Pt s/p cath today, pharmacy to resume IV heparin 8h after sheath removal (~1700) and resume warfarin. INR today 1.8, CBC relatively stable.  *Home warfarin dose 5mg  daily   Goal of Therapy:  Heparin level 0.3-0.7 units/ml Monitor platelets by anticoagulation protocol: Yes   Plan:  Restart heparin 1400  units/h no bolus at 0100 on 10/16 Check 6h heparin level Warfarin 7.5mg  PO x1 tonight Daily heparin level, CBC INR   Arrie Senate, PharmD, BCPS Clinical Pharmacist [phone] Please check AMION for all [place] numbers 06/24/2020

## 2020-06-24 NOTE — Progress Notes (Signed)
Progress Note  Patient Name: John [name] Date of Encounter: 06/24/2020  [facility] Cardiologist: Minus Breeding, MD   Subjective   Feeling well. Anxious to go for his cath.  No recurrent chest pain.    Inpatient Medications    Scheduled Meds: . Chlorhexidine Gluconate Cloth  6 each Topical Daily  . furosemide  20 mg Intravenous BID  . insulin aspart  0-15 Units Subcutaneous TID WC  . insulin aspart  0-5 Units Subcutaneous QHS  . isosorbide mononitrate  30 mg Oral Daily  . metoprolol  200 mg Oral QPC breakfast  . pantoprazole  40 mg Oral Daily  . rosuvastatin  40 mg Oral q1800  . sacubitril-valsartan  1 tablet Oral BID  . sodium chloride flush  3 mL Intravenous Q12H  . sodium chloride flush  3 mL Intravenous Q12H  . sodium chloride flush  3 mL Intravenous Q12H   Continuous Infusions: . sodium chloride    . sodium chloride    . sodium chloride    . heparin 1,150 Units/hr (06/24/20 0008)   PRN Meds: sodium chloride, sodium chloride, acetaminophen, diazepam, ondansetron (ZOFRAN) IV, sodium chloride flush, sodium chloride flush, zolpidem   Vital Signs    Vitals:   06/24/20 0002 06/24/20 0014 06/24/20 0423 06/24/20 0752  BP: 96/63 111/60 107/60 (!) 114/50  Pulse: 63 77 63 74  Resp: 13 14 16 20   Temp: 98.2 F (36.8 C)  98.5 F (36.9 C) 98.2 F (36.8 C)  TempSrc: Oral  Oral Oral  SpO2: 98% 99% 97% 99%  Weight:      Height:        Intake/Output Summary (Last 24 hours) at 06/24/2020 1050 Last data filed at 06/24/2020 1043 Gross per 24 hour  Intake 1075.78 ml  Output 1325 ml  Net -249.22 ml   Last 3 Weights 06/23/2020 06/22/2020 06/20/2020  Weight (lbs) 206 lb 3.2 oz 207 lb 9.6 oz 220 lb  Weight (kg) 93.532 kg 94.167 kg 99.791 kg      Telemetry    Atrial fibrillation.  PVCs.  Rate <100.  - Personally Reviewed  ECG    n/a - Personally Reviewed  Physical Exam   VS:  BP (!) 113/52   Pulse 69   Temp 97.9 F (36.6 C) (Oral)   Resp 15   Ht  5\' 11"  (1.803 m)   Wt 93.5 kg   SpO2 100%   BMI 28.76 kg/m  , BMI Body mass index is 28.76 kg/m. GENERAL:  Well appearing HEENT: Pupils equal round and reactive, fundi not visualized, oral mucosa unremarkable NECK:  No jugular venous distention, waveform within normal limits, carotid upstroke brisk and symmetric, no bruits LUNGS:  Clear to auscultation bilaterally HEART:  RRR.  PMI not displaced or sustained,S1 and S2 within normal limits, no S3, no S4, no clicks, no rubs, no murmurs ABD:  Flat, positive bowel sounds normal in frequency in pitch, no bruits, no rebound, no guarding, no midline pulsatile mass, no hepatomegaly, no splenomegaly EXT:  2 plus pulses throughout, no edema, no cyanosis no clubbing SKIN:  No rashes no nodules NEURO:  Cranial nerves II through XII grossly intact, motor grossly intact throughout PSYCH:  Cognitively intact, oriented to person place and time   Labs    High Sensitivity Troponin:   Recent Labs  Lab 06/20/20 1207 06/20/20 1448  TROPONINIHS 14 16      Chemistry Recent Labs  Lab 06/22/20 0618 06/22/20 0618 06/23/20 0135 06/23/20 0135 06/23/20  1602 06/23/20 1611 06/24/20 0206  NA 140   < > 140   < > 143  143 142 139  K 4.6   < > 4.2   < > 4.3  4.2 4.2 4.2  CL 108  --  107  --   --   --  105  CO2 23  --  25  --   --   --  23  GLUCOSE 127*  --  141*  --   --   --  209*  BUN 52*  --  49*  --   --   --  45*  CREATININE 1.37*  --  1.54*  --   --   --  1.61*  CALCIUM 9.0  --  8.7*  --   --   --  8.7*  GFRNONAA 53*  --  46*  --   --   --  44*  ANIONGAP 9  --  8  --   --   --  11   < > = values in this interval not displayed.     Hematology Recent Labs  Lab 06/22/20 0618 06/22/20 0618 06/23/20 0143 06/23/20 0143 06/23/20 1602 06/23/20 1611 06/24/20 0206  WBC 6.2  --  5.6  --   --   --  7.0  RBC 4.53  --  4.15*  --   --   --  4.16*  HGB 13.0   < > 11.9*   < > 12.6*  12.6* 12.2* 11.9*  HCT 40.4   < > 37.1*   < > 37.0*  37.0*  36.0* 37.3*  MCV 89.2  --  89.4  --   --   --  89.7  MCH 28.7  --  28.7  --   --   --  28.6  MCHC 32.2  --  32.1  --   --   --  31.9  RDW 14.4  --  14.4  --   --   --  14.5  PLT 192  --  164  --   --   --  151   < > = values in this interval not displayed.    BNP Recent Labs  Lab 06/20/20 1207  BNP 2,291.0*     DDimer No results for input(s): DDIMER in the last 168 hours.   Radiology    CARDIAC CATHETERIZATION  Result Date: 06/23/2020  Colon Flattery Cx lesion is 85% stenosed.  Mid LAD lesion is 100% stenosed.  Prox RCA lesion is 30% stenosed.  Prox RCA to Dist RCA lesion is 80% stenosed.  Origin lesion is 100% stenosed.  Prox LAD to Mid LAD lesion is 70% stenosed.  Severe native CAD with diffuse 7075% proximal LAD stenoses prior to total occlusion of the mid LAD; 80 to 90% ostial circumflex stenosis proximal to a high marginal/ramus intermediate length vessel; 30% proximal RCA stenosis with diffuse 80% mid to distal stenoses.  There is collateralization to the PLA vessel from the left coronary circulation. Patent LIMA to mid LAD. Old occlusion of the vein graft which previously supplied the distal RCA. Mild right heart pressure elevation.  Mean PA pressure 22 mm. Very mild aortic stenosis with a peak to peak gradient of 10 and mean gradient of 7.5 mmHg; AVA 2.4 cm RECOMMENDATION: Angiograms will be reviewed with colleagues.  The LIMA to LAD is widely patent.  The vein graft to the RCA is occluded and there is previously noted diffuse RCA disease.  The  PLA vessel is well collateralized from the left coronary circulation.  Will heparinize post procedure.  Consider PCI of the circumflex ostium in this patient with a protected LAD circulation.   Cardiac Studies   LHC 06/23/20:   Ost Cx lesion is 85% stenosed.  Mid LAD lesion is 100% stenosed.  Prox RCA lesion is 30% stenosed.  Prox RCA to Dist RCA lesion is 80% stenosed.  Origin lesion is 100% stenosed.  Prox LAD to Mid LAD lesion  is 70% stenosed.   Severe native CAD with diffuse 7075% proximal LAD stenoses prior to total occlusion of the mid LAD; 80 to 90% ostial circumflex stenosis proximal to a high marginal/ramus intermediate length vessel; 30% proximal RCA stenosis with diffuse 80% mid to distal stenoses.  There is collateralization to the PLA vessel from the left coronary circulation.  Patent LIMA to mid LAD.  Old occlusion of the vein graft which previously supplied the distal RCA.  Mild right heart pressure elevation.  Mean PA pressure 22 mm.  Very mild aortic stenosis with a peak to peak gradient of 10 and mean gradient of 7.5 mmHg; AVA 2.4 cm   Diagnostic Dominance: Right   Echo 12/23/19: 1. EF remains severely depressed to slightly worse than echo done October  2019 . Left ventricular ejection fraction, by estimation, is 25 to 30%.  The left ventricle has severely decreased function. The left ventricle  demonstrates global hypokinesis. The  left ventricular internal cavity size was severely dilated. There is mild  left ventricular hypertrophy. Left ventricular diastolic parameters are  indeterminate.  2. Right ventricular systolic function is normal. The right ventricular  size is normal. There is moderately elevated pulmonary artery systolic  pressure.  3. Left atrial size was severely dilated.  4. Normal appearing mechanical bileaflet mitral valve replacement with no  PVL . The mitral valve has been repaired/replaced. No evidence of mitral  valve regurgitation.  5. AS and mean gradient stable since echo done 07/03/18. The aortic valve  has an indeterminant number of cusps. Aortic valve regurgitation is mild.  Mild aortic valve stenosis.   Patient Profile     Mr. 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    Assessment & Plan    # Unstable angina: # CAD s/p CABG:  # Hyperlipidemia: Going for PCI of the ostial LCX today.  Currently chest pain free.  SVG to RCA is occluded.  LIMA to LAD is patent.  Continue aspirin, Imdur, metoprolol.  Warfarin on hold. He has been loaded with clopidogrel and getting IV fluids.  Pravastatin switched to rosuvastatin.   # Chronic systolic and diastolic heart failure: # Hypertension: LVEF 25-30% on echo 12/2019. He is s/p ICD for ischemic cardiomyopathy. Continue metoprolol, Entresto and imdur.  Stop IV lasix. Getting gentle IV lfuid as above.  # DM: Per primary team. Consider SGLT2 inhibitor.   # Mechanical mitral valve: Warfarin on hold.  Continue heparin.  Will need to resume post cath. Mean gradient 4 mmHg on echo 12/2019.  For questions or updates, please contact [facility] Please consult [email or web address] for contact info under        Signed, Skeet Latch, MD  06/24/2020, 10:50 AM

## 2020-06-24 NOTE — Progress Notes (Signed)
Pt's wife called by phone. Update given, all questions answered. She expressed appreciation.   Pt's hemodynamically stable. Atrial fib on monitor, HR 60s-70s. BP 94/54 -111/60 mmHg. Ambulated in his room independently, denied pain. Right groin dressing dry and clean. No hematoma. No immediate distress noted.  Continue to monitor.  Kennyth Lose, RN

## 2020-06-24 NOTE — Interval H&P Note (Signed)
History and Physical Interval Note:  06/24/2020 2:50 PM  John [name]  has presented today for surgery, with the diagnosis of unstable angina.  The various methods of treatment have been discussed with the patient and family. After consideration of risks, benefits and other options for treatment, the patient has consented to  Procedure(s): CORONARY STENT INTERVENTION (N/A) CORONARY ATHERECTOMY (N/A) as a surgical intervention.  The patient's history has been reviewed, patient examined, no change in status, stable for surgery.  I have reviewed the patient's chart and labs.  Questions were answered to the patient's satisfaction.    Cath Lab Visit (complete for each Cath Lab visit)  Clinical Evaluation Leading to the Procedure:   ACS: Yes.    Non-ACS:  N/A  Anshul Meddings

## 2020-06-24 NOTE — H&P (View-Only) (Signed)
Progress Note  Patient Name: John [name] Date of Encounter: 06/24/2020  [facility] Cardiologist: Minus Breeding, MD   Subjective   Feeling well. Anxious to go for his cath.  No recurrent chest pain.    Inpatient Medications    Scheduled Meds: . Chlorhexidine Gluconate Cloth  6 each Topical Daily  . furosemide  20 mg Intravenous BID  . insulin aspart  0-15 Units Subcutaneous TID WC  . insulin aspart  0-5 Units Subcutaneous QHS  . isosorbide mononitrate  30 mg Oral Daily  . metoprolol  200 mg Oral QPC breakfast  . pantoprazole  40 mg Oral Daily  . rosuvastatin  40 mg Oral q1800  . sacubitril-valsartan  1 tablet Oral BID  . sodium chloride flush  3 mL Intravenous Q12H  . sodium chloride flush  3 mL Intravenous Q12H  . sodium chloride flush  3 mL Intravenous Q12H   Continuous Infusions: . sodium chloride    . sodium chloride    . sodium chloride    . heparin 1,150 Units/hr (06/24/20 0008)   PRN Meds: sodium chloride, sodium chloride, acetaminophen, diazepam, ondansetron (ZOFRAN) IV, sodium chloride flush, sodium chloride flush, zolpidem   Vital Signs    Vitals:   06/24/20 0002 06/24/20 0014 06/24/20 0423 06/24/20 0752  BP: 96/63 111/60 107/60 (!) 114/50  Pulse: 63 77 63 74  Resp: 13 14 16 20   Temp: 98.2 F (36.8 C)  98.5 F (36.9 C) 98.2 F (36.8 C)  TempSrc: Oral  Oral Oral  SpO2: 98% 99% 97% 99%  Weight:      Height:        Intake/Output Summary (Last 24 hours) at 06/24/2020 1050 Last data filed at 06/24/2020 1043 Gross per 24 hour  Intake 1075.78 ml  Output 1325 ml  Net -249.22 ml   Last 3 Weights 06/23/2020 06/22/2020 06/20/2020  Weight (lbs) 206 lb 3.2 oz 207 lb 9.6 oz 220 lb  Weight (kg) 93.532 kg 94.167 kg 99.791 kg      Telemetry    Atrial fibrillation.  PVCs.  Rate <100.  - Personally Reviewed  ECG    n/a - Personally Reviewed  Physical Exam   VS:  BP (!) 113/52   Pulse 69   Temp 97.9 F (36.6 C) (Oral)   Resp 15   Ht  5\' 11"  (1.803 m)   Wt 93.5 kg   SpO2 100%   BMI 28.76 kg/m  , BMI Body mass index is 28.76 kg/m. GENERAL:  Well appearing HEENT: Pupils equal round and reactive, fundi not visualized, oral mucosa unremarkable NECK:  No jugular venous distention, waveform within normal limits, carotid upstroke brisk and symmetric, no bruits LUNGS:  Clear to auscultation bilaterally HEART:  RRR.  PMI not displaced or sustained,S1 and S2 within normal limits, no S3, no S4, no clicks, no rubs, no murmurs ABD:  Flat, positive bowel sounds normal in frequency in pitch, no bruits, no rebound, no guarding, no midline pulsatile mass, no hepatomegaly, no splenomegaly EXT:  2 plus pulses throughout, no edema, no cyanosis no clubbing SKIN:  No rashes no nodules NEURO:  Cranial nerves II through XII grossly intact, motor grossly intact throughout PSYCH:  Cognitively intact, oriented to person place and time   Labs    High Sensitivity Troponin:   Recent Labs  Lab 06/20/20 1207 06/20/20 1448  TROPONINIHS 14 16      Chemistry Recent Labs  Lab 06/22/20 0618 06/22/20 0618 06/23/20 0135 06/23/20 0135 06/23/20  1602 06/23/20 1611 06/24/20 0206  NA 140   < > 140   < > 143  143 142 139  K 4.6   < > 4.2   < > 4.3  4.2 4.2 4.2  CL 108  --  107  --   --   --  105  CO2 23  --  25  --   --   --  23  GLUCOSE 127*  --  141*  --   --   --  209*  BUN 52*  --  49*  --   --   --  45*  CREATININE 1.37*  --  1.54*  --   --   --  1.61*  CALCIUM 9.0  --  8.7*  --   --   --  8.7*  GFRNONAA 53*  --  46*  --   --   --  44*  ANIONGAP 9  --  8  --   --   --  11   < > = values in this interval not displayed.     Hematology Recent Labs  Lab 06/22/20 0618 06/22/20 0618 06/23/20 0143 06/23/20 0143 06/23/20 1602 06/23/20 1611 06/24/20 0206  WBC 6.2  --  5.6  --   --   --  7.0  RBC 4.53  --  4.15*  --   --   --  4.16*  HGB 13.0   < > 11.9*   < > 12.6*  12.6* 12.2* 11.9*  HCT 40.4   < > 37.1*   < > 37.0*  37.0*  36.0* 37.3*  MCV 89.2  --  89.4  --   --   --  89.7  MCH 28.7  --  28.7  --   --   --  28.6  MCHC 32.2  --  32.1  --   --   --  31.9  RDW 14.4  --  14.4  --   --   --  14.5  PLT 192  --  164  --   --   --  151   < > = values in this interval not displayed.    BNP Recent Labs  Lab 06/20/20 1207  BNP 2,291.0*     DDimer No results for input(s): DDIMER in the last 168 hours.   Radiology    CARDIAC CATHETERIZATION  Result Date: 06/23/2020  Colon Flattery Cx lesion is 85% stenosed.  Mid LAD lesion is 100% stenosed.  Prox RCA lesion is 30% stenosed.  Prox RCA to Dist RCA lesion is 80% stenosed.  Origin lesion is 100% stenosed.  Prox LAD to Mid LAD lesion is 70% stenosed.  Severe native CAD with diffuse 7075% proximal LAD stenoses prior to total occlusion of the mid LAD; 80 to 90% ostial circumflex stenosis proximal to a high marginal/ramus intermediate length vessel; 30% proximal RCA stenosis with diffuse 80% mid to distal stenoses.  There is collateralization to the PLA vessel from the left coronary circulation. Patent LIMA to mid LAD. Old occlusion of the vein graft which previously supplied the distal RCA. Mild right heart pressure elevation.  Mean PA pressure 22 mm. Very mild aortic stenosis with a peak to peak gradient of 10 and mean gradient of 7.5 mmHg; AVA 2.4 cm RECOMMENDATION: Angiograms will be reviewed with colleagues.  The LIMA to LAD is widely patent.  The vein graft to the RCA is occluded and there is previously noted diffuse RCA disease.  The  PLA vessel is well collateralized from the left coronary circulation.  Will heparinize post procedure.  Consider PCI of the circumflex ostium in this patient with a protected LAD circulation.   Cardiac Studies   LHC 06/23/20:   Ost Cx lesion is 85% stenosed.  Mid LAD lesion is 100% stenosed.  Prox RCA lesion is 30% stenosed.  Prox RCA to Dist RCA lesion is 80% stenosed.  Origin lesion is 100% stenosed.  Prox LAD to Mid LAD lesion  is 70% stenosed.   Severe native CAD with diffuse 7075% proximal LAD stenoses prior to total occlusion of the mid LAD; 80 to 90% ostial circumflex stenosis proximal to a high marginal/ramus intermediate length vessel; 30% proximal RCA stenosis with diffuse 80% mid to distal stenoses.  There is collateralization to the PLA vessel from the left coronary circulation.  Patent LIMA to mid LAD.  Old occlusion of the vein graft which previously supplied the distal RCA.  Mild right heart pressure elevation.  Mean PA pressure 22 mm.  Very mild aortic stenosis with a peak to peak gradient of 10 and mean gradient of 7.5 mmHg; AVA 2.4 cm   Diagnostic Dominance: Right   Echo 12/23/19: 1. EF remains severely depressed to slightly worse than echo done October  2019 . Left ventricular ejection fraction, by estimation, is 25 to 30%.  The left ventricle has severely decreased function. The left ventricle  demonstrates global hypokinesis. The  left ventricular internal cavity size was severely dilated. There is mild  left ventricular hypertrophy. Left ventricular diastolic parameters are  indeterminate.  2. Right ventricular systolic function is normal. The right ventricular  size is normal. There is moderately elevated pulmonary artery systolic  pressure.  3. Left atrial size was severely dilated.  4. Normal appearing mechanical bileaflet mitral valve replacement with no  PVL . The mitral valve has been repaired/replaced. No evidence of mitral  valve regurgitation.  5. AS and mean gradient stable since echo done 07/03/18. The aortic valve  has an indeterminant number of cusps. Aortic valve regurgitation is mild.  Mild aortic valve stenosis.   Patient Profile     Mr. Maddocks is a 16M with CAD status post CABG (patent LIMA, occluded SVG to RCA) chronic systolic and diastolic heart failure (LVEF 25 to 30%), status post mechanical mitral valve replacement secondary to rheumatic heart  disease, mild aortic stenosis, status post ICD, hypertension, persistent atrial fibrillation, and hyperlipidemia admitted with unstable angina.    Assessment & Plan    # Unstable angina: # CAD s/p CABG:  # Hyperlipidemia: Going for PCI of the ostial LCX today.  Currently chest pain free.  SVG to RCA is occluded.  LIMA to LAD is patent.  Continue aspirin, Imdur, metoprolol.  Warfarin on hold. He has been loaded with clopidogrel and getting IV fluids.  Pravastatin switched to rosuvastatin.   # Chronic systolic and diastolic heart failure: # Hypertension: LVEF 25-30% on echo 12/2019. He is s/p ICD for ischemic cardiomyopathy. Continue metoprolol, Entresto and imdur.  Stop IV lasix. Getting gentle IV lfuid as above.  # DM: Per primary team. Consider SGLT2 inhibitor.   # Mechanical mitral valve: Warfarin on hold.  Continue heparin.  Will need to resume post cath. Mean gradient 4 mmHg on echo 12/2019.  For questions or updates, please contact [facility] Please consult [email or web address] for contact info under        Signed, Skeet Latch, MD  06/24/2020, 10:50 AM

## 2020-06-24 NOTE — Progress Notes (Signed)
[facility] for heparin when INR <2.5 Indication: mechanical mitral valve/afib  Allergies  Allergen Reactions  . Doxycycline Hives    Patient Measurements: Height: 5\' 11"  (180.3 cm) Weight: 93.5 kg (206 lb 3.2 oz) IBW/kg (Calculated) : 75.3 HEPARIN DW (KG): 94.1  Vital Signs: Temp: 97.9 F (36.6 C) (10/15 1053) Temp Source: Oral (10/15 1053) BP: 113/52 (10/15 1053) Pulse Rate: 69 (10/15 1053)  Labs: Recent Labs    06/22/20 0618 06/22/20 0618 06/22/20 1528 06/22/20 2223 06/23/20 0135 06/23/20 0143 06/23/20 0143 06/23/20 1411 06/23/20 1602 06/23/20 1602 06/23/20 1611 06/24/20 0206 06/24/20 0918  HGB 13.0   < >  --   --   --  11.9*   < >  --  12.6*  12.6*   < > 12.2* 11.9*  --   HCT 40.4   < >  --   --   --  37.1*   < >  --  37.0*  37.0*  --  36.0* 37.3*  --   PLT 192  --   --   --   --  164  --   --   --   --   --  151  --   LABPROT 25.2*   < >  --   --   --  22.6*  --  19.1*  --   --   --  19.8*  --   INR 2.4*   < >  --   --   --  2.1*  --  1.7*  --   --   --  1.8*  --   HEPARINUNFRC  --   --    < > 0.72* 0.61  --   --   --   --   --   --   --  0.15*  CREATININE 1.37*  --   --   --  1.54*  --   --   --   --   --   --  1.61*  --    < > = values in this interval not displayed.    Estimated Creatinine Clearance: 52 mL/min (A) (by C-G formula based on SCr of 1.61 mg/dL (H)).   Assessment: Patient with chest pain concerning for unstable angina. He Is chronically anticoagulated with Coumadin for mechanical mitral valve and afib. Plan is to proceed with R/L HC when INR < 1.7.  Coumadin on hold and utilizing heparin bridge. -heparin level = 0.15 (previously at goal on 1400 units/hr   Goal of Therapy:  Heparin level 0.3-0.7 units/ml Monitor platelets by anticoagulation protocol: Yes   Plan:  Increase heparin to 1400 units/hr Heparin level in 6 hours and daily wth CBC daily   Hildred Laser, PharmD Clinical  Pharmacist **Pharmacist phone directory can now be found on [email or web address] ([email or web address]).  Listed under [place].

## 2020-06-25 DIAGNOSIS — I350 Nonrheumatic aortic (valve) stenosis: Secondary | ICD-10-CM | POA: Diagnosis not present

## 2020-06-25 DIAGNOSIS — I4819 Other persistent atrial fibrillation: Secondary | ICD-10-CM | POA: Diagnosis not present

## 2020-06-25 DIAGNOSIS — I200 Unstable angina: Secondary | ICD-10-CM | POA: Diagnosis not present

## 2020-06-25 DIAGNOSIS — R079 Chest pain, unspecified: Secondary | ICD-10-CM | POA: Diagnosis not present

## 2020-06-25 LAB — HEPARIN LEVEL (UNFRACTIONATED)
Heparin Unfractionated: 0.23 IU/mL — ABNORMAL LOW (ref 0.30–0.70)
Heparin Unfractionated: 0.41 IU/mL (ref 0.30–0.70)
Heparin Unfractionated: 0.41 IU/mL (ref 0.30–0.70)

## 2020-06-25 LAB — GLUCOSE, CAPILLARY
Glucose-Capillary: 197 mg/dL — ABNORMAL HIGH (ref 70–99)
Glucose-Capillary: 234 mg/dL — ABNORMAL HIGH (ref 70–99)
Glucose-Capillary: 244 mg/dL — ABNORMAL HIGH (ref 70–99)
Glucose-Capillary: 209 mg/dL — ABNORMAL HIGH (ref 70–99)
Glucose-Capillary: 203 mg/dL — ABNORMAL HIGH (ref 70–99)

## 2020-06-25 LAB — BASIC METABOLIC PANEL
Anion gap: 12 (ref 5–15)
BUN: 42 mg/dL — ABNORMAL HIGH (ref 8–23)
CO2: 21 mmol/L — ABNORMAL LOW (ref 22–32)
Calcium: 8.4 mg/dL — ABNORMAL LOW (ref 8.9–10.3)
Chloride: 104 mmol/L (ref 98–111)
Creatinine, Ser: 1.68 mg/dL — ABNORMAL HIGH (ref 0.61–1.24)
GFR, Estimated: 41 mL/min — ABNORMAL LOW (ref >60)
Glucose, Bld: 206 mg/dL — ABNORMAL HIGH (ref 70–99)
Potassium: 3.9 mmol/L (ref 3.5–5.1)
Sodium: 137 mmol/L (ref 135–145)

## 2020-06-25 LAB — CBC
HCT: 36 % — ABNORMAL LOW (ref 39.0–52.0)
Hemoglobin: 11.5 g/dL — ABNORMAL LOW (ref 13.0–17.0)
MCH: 28.5 pg (ref 26.0–34.0)
MCHC: 31.9 g/dL (ref 30.0–36.0)
MCV: 89.3 fL (ref 80.0–100.0)
Platelets: 136 10*3/uL — ABNORMAL LOW (ref 150–400)
RBC: 4.03 MIL/uL — ABNORMAL LOW (ref 4.22–5.81)
RDW: 14.8 % (ref 11.5–15.5)
WBC: 6.9 10*3/uL (ref 4.0–10.5)
nRBC: 0 % (ref 0.0–0.2)

## 2020-06-25 LAB — PROTIME-INR
INR: 1.8 — ABNORMAL HIGH (ref 0.8–1.2)
Prothrombin Time: 20.2 seconds — ABNORMAL HIGH (ref 11.4–15.2)

## 2020-06-25 MED ORDER — WARFARIN SODIUM 7.5 MG PO TABS: 7.5000 mg | ORAL_TABLET | Freq: Once | ORAL | Status: DC

## 2020-06-25 MED ORDER — ALUM & MAG HYDROXIDE-SIMETH 200-200-20 MG/5ML PO SUSP
30.0000 mL | ORAL | Status: DC | PRN
  Administered 2020-06-25: 30 mL via ORAL
  Filled 2020-06-25: qty 30

## 2020-06-25 MED ORDER — ALUM & MAG HYDROXIDE-SIMETH 200-200-20 MG/5ML PO SUSP
15.0000 mL | Freq: Once | ORAL | Status: AC
  Administered 2020-06-25: 15 mL via ORAL
  Filled 2020-06-25: qty 30

## 2020-06-25 MED ORDER — WARFARIN SODIUM 7.5 MG PO TABS
7.5000 mg | ORAL_TABLET | Freq: Once | ORAL | Status: AC
  Administered 2020-06-25: 7.5 mg via ORAL
  Filled 2020-06-25: qty 1

## 2020-06-25 NOTE — Progress Notes (Addendum)
[facility] for heparin + warfarin Indication: mechanical mitral valve/afib  Allergies  Allergen Reactions  . Doxycycline Hives    Patient Measurements: Height: 5\' 11"  (180.3 cm) Weight: 93.9 kg (206 lb 15.1 oz) IBW/kg (Calculated) : 75.3 HEPARIN DW (KG): 94.1  Vital Signs: Temp: 98.7 F (37.1 C) (10/16 0405) Temp Source: Oral (10/16 0405) BP: 115/62 (10/16 0405) Pulse Rate: 80 (10/16 0405)  Labs: Recent Labs    06/23/20 0135 06/23/20 0143 06/23/20 0143 06/23/20 1411 06/23/20 1602 06/23/20 1611 06/23/20 1611 06/24/20 0206 06/24/20 0918 06/25/20 0632  HGB  --  11.9*  --   --    < > 12.2*   < > 11.9*  --  11.5*  HCT  --  37.1*  --   --    < > 36.0*  --  37.3*  --  36.0*  PLT  --  164  --   --   --   --   --  151  --  136*  LABPROT  --  22.6*   < > 19.1*  --   --   --  19.8*  --  20.2*  INR  --  2.1*   < > 1.7*  --   --   --  1.8*  --  1.8*  HEPARINUNFRC 0.61  --   --   --   --   --   --   --  0.15* 0.23*  CREATININE 1.54*  --   --   --   --   --   --  1.61*  --  1.68*   < > = values in this interval not displayed.    Estimated Creatinine Clearance: 49.9 mL/min (A) (by C-G formula based on SCr of 1.68 mg/dL (H)).   Assessment: Patient with chest pain concerning for unstable angina. Chronically anticoagulated with warfarin (5 mg daily) for mechanical mitral valve and afib. Pt s/p cath on 10/15 and IV heparin resumed 8h after sheath removal (~1700) and resumed warfarin 7.5 mgx1.  Today, heparin level remains subtherapeutic at 0.23 and INR at 1.8. CBC stable. No reports of bleeding or infusion issues per RN.  Goal of Therapy:  Heparin level 0.3-0.7 units/ml Monitor platelets by anticoagulation protocol: Yes  Goal INR is 2.5-3.5    Plan:  Increase heparin to 1600 units/h Check 6h heparin level Warfarin 7.5mg  PO x1 tonight Daily heparin level, CBC INR  Addendum: Follow-up 6hr heparin level therapeutic at 0.41.  Plan:   Continue heparin 1600 units/h Check 6hr confirmatory heparin level  Lorel Monaco, PharmD PGY2 Ambulatory Care Resident [place]

## 2020-06-25 NOTE — Progress Notes (Signed)
Progress Note  Patient Name: John [name] Date of Encounter: 06/25/2020  [facility] Cardiologist: Minus Breeding, MD   Subjective   S/p successful PCI yesterday to the distal LMCA into the proximal LCx. No issues overnight. INR 1.8 - history of mechanical MVR with target INR 2.5-3.5.  Inpatient Medications    Scheduled Meds:  Chlorhexidine Gluconate Cloth  6 each Topical Daily   clopidogrel  75 mg Oral Q breakfast   insulin aspart  0-15 Units Subcutaneous TID WC   insulin aspart  0-5 Units Subcutaneous QHS   isosorbide mononitrate  30 mg Oral Daily   metoprolol  200 mg Oral QPC breakfast   pantoprazole  40 mg Oral Daily   rosuvastatin  40 mg Oral q1800   sacubitril-valsartan  1 tablet Oral BID   sodium chloride flush  3 mL Intravenous Q12H   warfarin  7.5 mg Oral ONCE-1600   Warfarin - Pharmacist Dosing Inpatient   Does not apply q1600   Continuous Infusions:  sodium chloride     heparin 1,600 Units/hr (06/25/20 0757)   PRN Meds: sodium chloride, acetaminophen, acetaminophen, diazepam, ondansetron (ZOFRAN) IV, sodium chloride flush, zolpidem   Vital Signs    Vitals:   06/24/20 1938 06/24/20 2345 06/25/20 0405 06/25/20 0826  BP: (!) 117/59 115/62 115/62 (!) 99/54  Pulse:   80 85  Resp:   18 16  Temp: 98 F (36.7 C) 97.8 F (36.6 C) 98.7 F (37.1 C)   TempSrc: Oral Oral Oral   SpO2:   100% 95%  Weight:   93.9 kg   Height:        Intake/Output Summary (Last 24 hours) at 06/25/2020 1155 Last data filed at 06/25/2020 0830 Gross per 24 hour  Intake 646.3 ml  Output 4 ml  Net 642.3 ml   Last 3 Weights 06/25/2020 06/24/2020 06/23/2020  Weight (lbs) 206 lb 15.1 oz 204 lb 9.4 oz 206 lb 3.2 oz  Weight (kg) 93.87 kg 92.8 kg 93.532 kg      Telemetry    Atrial fibrillation.  PVCs.  Rate <100.  - Personally Reviewed  ECG    n/a - Personally Reviewed  Physical Exam   VS:  BP (!) 99/54 (BP Location: Right Arm)    Pulse 85    Temp 98.7  F (37.1 C) (Oral)    Resp 16    Ht 5\' 11"  (1.803 m)    Wt 93.9 kg    SpO2 95%    BMI 28.86 kg/m  , BMI Body mass index is 28.86 kg/m.  General appearance: alert and no distress Neck: no carotid bruit, no JVD and thyroid not enlarged, symmetric, no tenderness/mass/nodules Lungs: clear to auscultation bilaterally Heart: irregularly irregular rhythm, S1, S2 normal and ejection click present Abdomen: soft, non-tender; bowel sounds normal; no masses,  no organomegaly Extremities: extremities normal, atraumatic, no cyanosis or edema Pulses: 2+ and symmetric Skin: Skin color, texture, turgor normal. No rashes or lesions Neurologic: Grossly normal PSych: Pleasant   Labs    High Sensitivity Troponin:   Recent Labs  Lab 06/20/20 1207 06/20/20 1448  TROPONINIHS 14 16      Chemistry Recent Labs  Lab 06/23/20 0135 06/23/20 1602 06/23/20 1611 06/24/20 0206 06/25/20 0632  NA 140   < > 142 139 137  K 4.2   < > 4.2 4.2 3.9  CL 107  --   --  105 104  CO2 25  --   --  23 21*  GLUCOSE 141*  --   --  209* 206*  BUN 49*  --   --  45* 42*  CREATININE 1.54*  --   --  1.61* 1.68*  CALCIUM 8.7*  --   --  8.7* 8.4*  GFRNONAA 46*  --   --  44* 41*  ANIONGAP 8  --   --  11 12   < > = values in this interval not displayed.     Hematology Recent Labs  Lab 06/23/20 0143 06/23/20 1602 06/23/20 1611 06/24/20 0206 06/25/20 0632  WBC 5.6  --   --  7.0 6.9  RBC 4.15*  --   --  4.16* 4.03*  HGB 11.9*   < > 12.2* 11.9* 11.5*  HCT 37.1*   < > 36.0* 37.3* 36.0*  MCV 89.4  --   --  89.7 89.3  MCH 28.7  --   --  28.6 28.5  MCHC 32.1  --   --  31.9 31.9  RDW 14.4  --   --  14.5 14.8  PLT 164  --   --  151 136*   < > = values in this interval not displayed.    BNP Recent Labs  Lab 06/20/20 1207  BNP 2,291.0*     DDimer No results for input(s): DDIMER in the last 168 hours.   Radiology    CARDIAC CATHETERIZATION  Result Date: 06/24/2020 Conclusions: 1. Severe, calcified disease  involving the distal LMCA extending into the ostial and proximal LCx, as seen on yesterday's diagnostic coronary angiogram. 2. Successful PCI using orbital atherectomy and drug-eluting stent placement in the distal LMCA into the proximal LCx using a Resolute Onyx 2.75 x 26 mm drug-eluting stent.  Plaque shift into the ramus intermedius necessitated kissing balloon inflation.  Final angiogram demonstrates 0% residual stenosis with TIMI-3 flow. 3. Calcified and stenotic left radial artery precluding advancement of a 36F slender glide sheath.  Recommend alternative access for future catheterizations. Recommendations: 1. Restart heparin infusion 4 hours after femoral sheath removal if no evidence of bleeding complications.  Restart warfarin tonight, with heparin or enoxaparin bridging until INR therapeutic. 2. Continue triple therapy with aspirin, clopidogrel, and warfarin for 1 month, after which time aspirin can be discontinued.  12 months of therapy with warfarin and clopidogrel is recommended, if tolerated from a bleeding standpoint. 3. Aggressive secondary prevention. [name], MD [facility]   CARDIAC CATHETERIZATION  Result Date: 06/23/2020  Colon Flattery Cx lesion is 85% stenosed.  Mid LAD lesion is 100% stenosed.  Prox RCA lesion is 30% stenosed.  Prox RCA to Dist RCA lesion is 80% stenosed.  Origin lesion is 100% stenosed.  Prox LAD to Mid LAD lesion is 70% stenosed.  Severe native CAD with diffuse 7075% proximal LAD stenoses prior to total occlusion of the mid LAD; 80 to 90% ostial circumflex stenosis proximal to a high marginal/ramus intermediate length vessel; 30% proximal RCA stenosis with diffuse 80% mid to distal stenoses.  There is collateralization to the PLA vessel from the left coronary circulation. Patent LIMA to mid LAD. Old occlusion of the vein graft which previously supplied the distal RCA. Mild right heart pressure elevation.  Mean PA pressure 22 mm. Very mild aortic stenosis with a  peak to peak gradient of 10 and mean gradient of 7.5 mmHg; AVA 2.4 cm RECOMMENDATION: Angiograms will be reviewed with colleagues.  The LIMA to LAD is widely patent.  The vein graft to the RCA is occluded and there is  previously noted diffuse RCA disease.  The PLA vessel is well collateralized from the left coronary circulation.  Will heparinize post procedure.  Consider PCI of the circumflex ostium in this patient with a protected LAD circulation.   Cardiac Studies   LHC 06/23/20:   Ost Cx lesion is 85% stenosed.  Mid LAD lesion is 100% stenosed.  Prox RCA lesion is 30% stenosed.  Prox RCA to Dist RCA lesion is 80% stenosed.  Origin lesion is 100% stenosed.  Prox LAD to Mid LAD lesion is 70% stenosed.   Severe native CAD with diffuse 7075% proximal LAD stenoses prior to total occlusion of the mid LAD; 80 to 90% ostial circumflex stenosis proximal to a high marginal/ramus intermediate length vessel; 30% proximal RCA stenosis with diffuse 80% mid to distal stenoses.  There is collateralization to the PLA vessel from the left coronary circulation.  Patent LIMA to mid LAD.  Old occlusion of the vein graft which previously supplied the distal RCA.  Mild right heart pressure elevation.  Mean PA pressure 22 mm.  Very mild aortic stenosis with a peak to peak gradient of 10 and mean gradient of 7.5 mmHg; AVA 2.4 cm   Diagnostic Dominance: Right   Echo 12/23/19: 1. EF remains severely depressed to slightly worse than echo done October  2019 . Left ventricular ejection fraction, by estimation, is 25 to 30%.  The left ventricle has severely decreased function. The left ventricle  demonstrates global hypokinesis. The  left ventricular internal cavity size was severely dilated. There is mild  left ventricular hypertrophy. Left ventricular diastolic parameters are  indeterminate.  2. Right ventricular systolic function is normal. The right ventricular  size is normal. There is  moderately elevated pulmonary artery systolic  pressure.  3. Left atrial size was severely dilated.  4. Normal appearing mechanical bileaflet mitral valve replacement with no  PVL . The mitral valve has been repaired/replaced. No evidence of mitral  valve regurgitation.  5. AS and mean gradient stable since echo done 07/03/18. The aortic valve  has an indeterminant number of cusps. Aortic valve regurgitation is mild.  Mild aortic valve stenosis.   Patient Profile     Mr. 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    Assessment & Plan    # Unstable angina:  Mr. Savino says he feels great today after PCI yesterday to the distal LMCA into the LCx. Now on ASA, Plavix and warfarin. INR 1.8 today- target is 2.5-3.5 given mechanical MVR.  # CAD s/p CABG:  as above- culprit for symptoms was likely the distal LM disease and has now been stented.  # Hyperlipidemia:   Pravastatin switched to rosuvastatin.   # Chronic systolic and diastolic heart failure: # Hypertension: LVEF 25-30% on echo 12/2019. He is s/p ICD for ischemic cardiomyopathy. Continue metoprolol, Entresto and imdur.  Stop IV lasix. Getting gentle IV fluids as above. BP soft today.  # DM: Per primary team. Consider SGLT2 inhibitor.   # Mechanical mitral valve: Warfarin on hold.  Continue heparin.  Will need to resume post cath. Mean gradient 4 mmHg on echo 12/2019. INR 1.8 today-  Will need therapeutic INR before d/c home given higher risk of MV thrombosis in the mitral  position.   For questions or updates, please contact [place] Please consult [email or web address] for contact info under   Pixie Casino, MD,  FACC, [place] Director of the [facility]  [facility] Diplomate of the American Board of Clinical Lipidology Attending Cardiologist  Direct Dial: [phone]   Fax: [phone]  Website:  [email or web address]  Pixie Casino, MD  06/25/2020, 11:55 AM

## 2020-06-25 NOTE — Progress Notes (Signed)
CARDIAC REHAB PHASE I   PRE:  Rate/Rhythm: 83 Afib  BP:  Supine:   Sitting: 113/59  Standing:    SaO2: 93% RA  MODE:  Ambulation: 470 ft   POST:  Rate/Rhythm: 94 Afib  BP:  Supine:   Sitting: 99/54  Standing:    SaO2: 95% RA  2194-7125 Patient tolerated ambulation well with assist x1. Patient denies symptoms with ambulation. Vital signs within normal limits. Reviewed PCI/stent education with patient including stent card, ASA/Plavix use, CP, NTG use, and calling 911, restrictions, risk factor modification and activity progression. Patient verbalizes understanding. Discussed phase 2 cardiac rehab, and patient is interested in the program at [facility], referral sent.  John Passer, MS, ACSM CEP

## 2020-06-25 NOTE — Progress Notes (Signed)
[place] for heparin + warfarin Indication: mechanical mitral valve/afib  Allergies  Allergen Reactions  . Doxycycline Hives    Patient Measurements: Height: 5\' 11"  (180.3 cm) Weight: 93.9 kg (206 lb 15.1 oz) IBW/kg (Calculated) : 75.3 HEPARIN DW (KG): 94.1  Vital Signs: Temp: 98 F (36.7 C) (10/16 1932) Temp Source: Oral (10/16 1932) BP: 117/68 (10/16 1932) Pulse Rate: 77 (10/16 1716)  Labs: Recent Labs    06/23/20 0135 06/23/20 0143 06/23/20 0143 06/23/20 1411 06/23/20 1602 06/23/20 1611 06/23/20 1611 06/24/20 0206 06/24/20 0918 06/25/20 0632 06/25/20 1451 06/25/20 2057  HGB  --  11.9*  --   --    < > 12.2*   < > 11.9*  --  11.5*  --   --   HCT  --  37.1*  --   --    < > 36.0*  --  37.3*  --  36.0*  --   --   PLT  --  164  --   --   --   --   --  151  --  136*  --   --   LABPROT  --  22.6*   < > 19.1*  --   --   --  19.8*  --  20.2*  --   --   INR  --  2.1*   < > 1.7*  --   --   --  1.8*  --  1.8*  --   --   HEPARINUNFRC 0.61  --   --   --   --   --   --   --    < > 0.23* 0.41 0.41  CREATININE 1.54*  --   --   --   --   --   --  1.61*  --  1.68*  --   --    < > = values in this interval not displayed.    Estimated Creatinine Clearance: 49.9 mL/min (A) (by C-G formula based on SCr of 1.68 mg/dL (H)).   Assessment: Patient with chest pain concerning for unstable angina. Chronically anticoagulated with warfarin (5 mg daily) for mechanical mitral valve and afib. Pt s/p cath on 10/15 and IV heparin resumed 8h after sheath removal (~1700) and resumed warfarin 7.5 mgx1.  Heparin drip increased earlier today to 1600 uts/hr heparin level 0.4 at goal tonight.  INR 1.8 < goal. CBC stable. No reports of bleeding or infusion issues per RN.  Goal of Therapy:  Heparin level 0.3-0.7 units/ml Monitor platelets by anticoagulation protocol: Yes  Goal INR is 2.5-3.5    Plan:  Continue heparin  1600 units/h Daily heparin level, CBC  INR   Bonnita Nasuti Pharm.D. CPP, BCPS Clinical Pharmacist [phone] 06/25/2020 10:15 PM

## 2020-06-25 NOTE — Progress Notes (Signed)
Patient's SBP was lower in the morning 99/54 at 0826 and hold metoprolol. HR is under 100. Dr. Debara Pickett made aware of hypotensive and move metoprolol evening times. HS [employer]

## 2020-06-26 DIAGNOSIS — I255 Ischemic cardiomyopathy: Secondary | ICD-10-CM | POA: Diagnosis not present

## 2020-06-26 DIAGNOSIS — N179 Acute kidney failure, unspecified: Secondary | ICD-10-CM | POA: Diagnosis not present

## 2020-06-26 DIAGNOSIS — I350 Nonrheumatic aortic (valve) stenosis: Secondary | ICD-10-CM | POA: Diagnosis not present

## 2020-06-26 DIAGNOSIS — R079 Chest pain, unspecified: Secondary | ICD-10-CM | POA: Diagnosis not present

## 2020-06-26 LAB — URINALYSIS, ROUTINE W REFLEX MICROSCOPIC
Bilirubin Urine: NEGATIVE
Glucose, UA: 50 mg/dL — AB
Hgb urine dipstick: NEGATIVE
Ketones, ur: NEGATIVE mg/dL
Nitrite: NEGATIVE
Protein, ur: NEGATIVE mg/dL
Specific Gravity, Urine: 1.029 (ref 1.005–1.030)
pH: 5 (ref 5.0–8.0)

## 2020-06-26 LAB — CBC
HCT: 34.6 % — ABNORMAL LOW (ref 39.0–52.0)
Hemoglobin: 11.1 g/dL — ABNORMAL LOW (ref 13.0–17.0)
MCH: 28.6 pg (ref 26.0–34.0)
MCHC: 32.1 g/dL (ref 30.0–36.0)
MCV: 89.2 fL (ref 80.0–100.0)
Platelets: 138 10*3/uL — ABNORMAL LOW (ref 150–400)
RBC: 3.88 MIL/uL — ABNORMAL LOW (ref 4.22–5.81)
RDW: 14.7 % (ref 11.5–15.5)
WBC: 5.6 10*3/uL (ref 4.0–10.5)
nRBC: 0 % (ref 0.0–0.2)

## 2020-06-26 LAB — BASIC METABOLIC PANEL
Anion gap: 13 (ref 5–15)
BUN: 54 mg/dL — ABNORMAL HIGH (ref 8–23)
CO2: 19 mmol/L — ABNORMAL LOW (ref 22–32)
Calcium: 8.2 mg/dL — ABNORMAL LOW (ref 8.9–10.3)
Chloride: 103 mmol/L (ref 98–111)
Creatinine, Ser: 2.8 mg/dL — ABNORMAL HIGH (ref 0.61–1.24)
GFR, Estimated: 22 mL/min — ABNORMAL LOW (ref >60)
Glucose, Bld: 245 mg/dL — ABNORMAL HIGH (ref 70–99)
Potassium: 3.8 mmol/L (ref 3.5–5.1)
Sodium: 135 mmol/L (ref 135–145)

## 2020-06-26 LAB — PROTIME-INR
INR: 2.5 — ABNORMAL HIGH (ref 0.8–1.2)
Prothrombin Time: 26 seconds — ABNORMAL HIGH (ref 11.4–15.2)

## 2020-06-26 LAB — GLUCOSE, CAPILLARY
Glucose-Capillary: 193 mg/dL — ABNORMAL HIGH (ref 70–99)
Glucose-Capillary: 340 mg/dL — ABNORMAL HIGH (ref 70–99)
Glucose-Capillary: 142 mg/dL — ABNORMAL HIGH (ref 70–99)
Glucose-Capillary: 188 mg/dL — ABNORMAL HIGH (ref 70–99)

## 2020-06-26 LAB — HEPARIN LEVEL (UNFRACTIONATED): Heparin Unfractionated: 0.43 IU/mL (ref 0.30–0.70)

## 2020-06-26 MED ORDER — GLIPIZIDE ER 5 MG PO TB24
10.0000 mg | ORAL_TABLET | Freq: Every day | ORAL | Status: DC
  Administered 2020-06-26 – 2020-07-01 (×5): 10 mg via ORAL
  Filled 2020-06-26 (×5): qty 2

## 2020-06-26 MED ORDER — INSULIN GLARGINE 100 UNIT/ML ~~LOC~~ SOLN
85.0000 [IU] | Freq: Every day | SUBCUTANEOUS | Status: DC
  Administered 2020-06-26 – 2020-06-27 (×2): 85 [IU] via SUBCUTANEOUS
  Filled 2020-06-26 (×3): qty 0.85

## 2020-06-26 MED ORDER — WARFARIN SODIUM 5 MG PO TABS
5.0000 mg | ORAL_TABLET | Freq: Once | ORAL | Status: AC
  Administered 2020-06-26: 5 mg via ORAL
  Filled 2020-06-26: qty 1

## 2020-06-26 NOTE — Progress Notes (Signed)
On rounds patient found to be without monitor on and standing in room confused, pulled out IV with heparin infusing.  Patient reoriented.  Pt stated he woke up and though he was at a friend's house and didn't know what was in his arm.  Pt cleaned up and assisted back to bed.  Of note, pt did have diazepam at bed time.  Pt stated he had taken medication about 30 years ago with no adverse reaction noted.

## 2020-06-26 NOTE — Progress Notes (Signed)
Metoprolol was held yesterday for soft BP, and was given this AM.  SBP in 80s this afternoon while sitting in a recliner. No c/o dizziness.  Daleen Snook, PA made aware.  Order received to monitor him and hold metoprolol if SBP < 100.   Idolina Primer, RN

## 2020-06-26 NOTE — Progress Notes (Signed)
[place] for heparin + warfarin Indication: mechanical mitral valve/afib  Allergies  Allergen Reactions   Doxycycline Hives    Patient Measurements: Height: 5\' 11"  (180.3 cm) Weight: 92.9 kg (204 lb 11.9 oz) IBW/kg (Calculated) : 75.3 HEPARIN DW (KG): 94.1  Vital Signs: Temp: 98.7 F (37.1 C) (10/17 0427) Temp Source: Oral (10/17 0427) BP: 120/88 (10/17 0427)  Labs: Recent Labs    06/24/20 0206 06/24/20 6789 06/25/20 3810 06/25/20 1751 06/25/20 1451 06/25/20 2057 06/26/20 0226  HGB 11.9*  --  11.5*  --   --   --  11.1*  HCT 37.3*  --  36.0*  --   --   --  34.6*  PLT 151  --  136*  --   --   --  138*  LABPROT 19.8*  --  20.2*  --   --   --  26.0*  INR 1.8*  --  1.8*  --   --   --  2.5*  HEPARINUNFRC  --    < > 0.23*   < > 0.41 0.41 0.43  CREATININE 1.61*  --  1.68*  --   --   --  2.80*   < > = values in this interval not displayed.    Estimated Creatinine Clearance: 29.8 mL/min (A) (by C-G formula based on SCr of 2.8 mg/dL (H)).   Assessment: Patient with chest pain concerning for unstable angina. Chronically anticoagulated with warfarin (5 mg daily) for mechanical mitral valve and afib. Pt s/p cath on 10/15 and IV heparin resumed 8h after sheath removal (~1700) and resumed warfarin 7.5 mgx1.  Heparin level therapeutic, INR now within goal rang at 2.5, H/H stable.  Goal of Therapy:  Heparin level 0.3-0.7 units/ml Monitor platelets by anticoagulation protocol: Yes  Goal INR is 2.5-3.5    Plan:  -Stop IV heparin (INR 2.5) -Warfarin 5mg  PO x1 tonight - would discharge on 5mg  daily (home dose) -Daily INR  Arrie Senate, PharmD, BCPS Clinical Pharmacist [phone] Please check AMION for all [place] numbers 06/26/2020

## 2020-06-26 NOTE — Plan of Care (Signed)
  Problem: Education: Goal: Understanding of cardiac disease, CV risk reduction, and recovery process will improve Outcome: Progressing Goal: Individualized Educational Video(s) Outcome: Progressing   Problem: Activity: Goal: Ability to tolerate increased activity will improve Outcome: Progressing   Problem: Cardiac: Goal: Ability to achieve and maintain adequate cardiovascular perfusion will improve Outcome: Progressing   Problem: Health Behavior/Discharge Planning: Goal: Ability to safely manage health-related needs after discharge will improve Outcome: Progressing   Problem: Education: Goal: Knowledge of General Education information will improve Description: Including pain rating scale, medication(s)/side effects and non-pharmacologic comfort measures Outcome: Progressing   Problem: Education: Goal: Knowledge of General Education information will improve Description: Including pain rating scale, medication(s)/side effects and non-pharmacologic comfort measures Outcome: Progressing

## 2020-06-26 NOTE — Progress Notes (Addendum)
Progress Note  Patient Name: John [name] Date of Encounter: 06/26/2020  [facility] Cardiologist: Minus Breeding, MD   Subjective   No overnight chest pain. INR 2.5 today, however, creatinine jumped to 2.8 from 1.68 yesterday.  Inpatient Medications    Scheduled Meds: . Chlorhexidine Gluconate Cloth  6 each Topical Daily  . clopidogrel  75 mg Oral Q breakfast  . insulin aspart  0-15 Units Subcutaneous TID WC  . insulin aspart  0-5 Units Subcutaneous QHS  . isosorbide mononitrate  30 mg Oral Daily  . metoprolol  200 mg Oral QPC breakfast  . pantoprazole  40 mg Oral Daily  . rosuvastatin  40 mg Oral q1800  . sodium chloride flush  3 mL Intravenous Q12H  . warfarin  5 mg Oral ONCE-1600  . Warfarin - Pharmacist Dosing Inpatient   Does not apply q1600   Continuous Infusions: . sodium chloride     PRN Meds: sodium chloride, acetaminophen, alum & mag hydroxide-simeth, ondansetron (ZOFRAN) IV, sodium chloride flush, zolpidem   Vital Signs    Vitals:   06/25/20 1932 06/26/20 0427 06/26/20 0800 06/26/20 0959  BP: 117/68 120/88  (!) 104/49  Pulse:   71 89  Resp:   18   Temp: 98 F (36.7 C) 98.7 F (37.1 C)    TempSrc: Oral Oral    SpO2:  96% 96%   Weight:  92.9 kg    Height:        Intake/Output Summary (Last 24 hours) at 06/26/2020 1222 Last data filed at 06/26/2020 0800 Gross per 24 hour  Intake 779.4 ml  Output --  Net 779.4 ml   Last 3 Weights 06/26/2020 06/25/2020 06/24/2020  Weight (lbs) 204 lb 11.9 oz 206 lb 15.1 oz 204 lb 9.4 oz  Weight (kg) 92.87 kg 93.87 kg 92.8 kg      Telemetry    Atrial fibrillation in the 80's.  - Personally Reviewed  ECG    n/a - Personally Reviewed  Physical Exam   VS:  BP (!) 104/49   Pulse 89   Temp 98.7 F (37.1 C) (Oral)   Resp 18   Ht 5\' 11"  (1.803 m)   Wt 92.9 kg   SpO2 96%   BMI 28.56 kg/m  , BMI Body mass index is 28.56 kg/m.  General appearance: alert and no distress Neck: no carotid bruit,  no JVD and thyroid not enlarged, symmetric, no tenderness/mass/nodules Lungs: clear to auscultation bilaterally Heart: irregularly irregular rhythm, S1, S2 normal and ejection click present Abdomen: soft, non-tender; bowel sounds normal; no masses,  no organomegaly Extremities: extremities normal, atraumatic, no cyanosis or edema Pulses: 2+ and symmetric Skin: Skin color, texture, turgor normal. No rashes or lesions Neurologic: Grossly normal PSych: Pleasant   Labs    High Sensitivity Troponin:   Recent Labs  Lab 06/20/20 1207 06/20/20 1448  TROPONINIHS 14 16      Chemistry Recent Labs  Lab 06/24/20 0206 06/25/20 0632 06/26/20 0226  NA 139 137 135  K 4.2 3.9 3.8  CL 105 104 103  CO2 23 21* 19*  GLUCOSE 209* 206* 245*  BUN 45* 42* 54*  CREATININE 1.61* 1.68* 2.80*  CALCIUM 8.7* 8.4* 8.2*  GFRNONAA 44* 41* 22*  ANIONGAP 11 12 13      Hematology Recent Labs  Lab 06/24/20 0206 06/25/20 0632 06/26/20 0226  WBC 7.0 6.9 5.6  RBC 4.16* 4.03* 3.88*  HGB 11.9* 11.5* 11.1*  HCT 37.3* 36.0* 34.6*  MCV 89.7  89.3 89.2  MCH 28.6 28.5 28.6  MCHC 31.9 31.9 32.1  RDW 14.5 14.8 14.7  PLT 151 136* 138*    BNP Recent Labs  Lab 06/20/20 1207  BNP 2,291.0*     DDimer No results for input(s): DDIMER in the last 168 hours.   Radiology    CARDIAC CATHETERIZATION  Result Date: 06/24/2020 Conclusions: 1. Severe, calcified disease involving the distal LMCA extending into the ostial and proximal LCx, as seen on yesterday's diagnostic coronary angiogram. 2. Successful PCI using orbital atherectomy and drug-eluting stent placement in the distal LMCA into the proximal LCx using a Resolute Onyx 2.75 x 26 mm drug-eluting stent.  Plaque shift into the ramus intermedius necessitated kissing balloon inflation.  Final angiogram demonstrates 0% residual stenosis with TIMI-3 flow. 3. Calcified and stenotic left radial artery precluding advancement of a 45F slender glide sheath.  Recommend  alternative access for future catheterizations. Recommendations: 1. Restart heparin infusion 4 hours after femoral sheath removal if no evidence of bleeding complications.  Restart warfarin tonight, with heparin or enoxaparin bridging until INR therapeutic. 2. Continue triple therapy with aspirin, clopidogrel, and warfarin for 1 month, after which time aspirin can be discontinued.  12 months of therapy with warfarin and clopidogrel is recommended, if tolerated from a bleeding standpoint. 3. Aggressive secondary prevention. [name], MD [facility]    Cardiac Studies   LHC 06/23/20:   Ost Cx lesion is 85% stenosed.  Mid LAD lesion is 100% stenosed.  Prox RCA lesion is 30% stenosed.  Prox RCA to Dist RCA lesion is 80% stenosed.  Origin lesion is 100% stenosed.  Prox LAD to Mid LAD lesion is 70% stenosed.   Severe native CAD with diffuse 7075% proximal LAD stenoses prior to total occlusion of the mid LAD; 80 to 90% ostial circumflex stenosis proximal to a high marginal/ramus intermediate length vessel; 30% proximal RCA stenosis with diffuse 80% mid to distal stenoses.  There is collateralization to the PLA vessel from the left coronary circulation.  Patent LIMA to mid LAD.  Old occlusion of the vein graft which previously supplied the distal RCA.  Mild right heart pressure elevation.  Mean PA pressure 22 mm.  Very mild aortic stenosis with a peak to peak gradient of 10 and mean gradient of 7.5 mmHg; AVA 2.4 cm   Diagnostic Dominance: Right   Echo 12/23/19: 1. EF remains severely depressed to slightly worse than echo done October  2019 . Left ventricular ejection fraction, by estimation, is 25 to 30%.  The left ventricle has severely decreased function. The left ventricle  demonstrates global hypokinesis. The  left ventricular internal cavity size was severely dilated. There is mild  left ventricular hypertrophy. Left ventricular diastolic parameters are    indeterminate.  2. Right ventricular systolic function is normal. The right ventricular  size is normal. There is moderately elevated pulmonary artery systolic  pressure.  3. Left atrial size was severely dilated.  4. Normal appearing mechanical bileaflet mitral valve replacement with no  PVL . The mitral valve has been repaired/replaced. No evidence of mitral  valve regurgitation.  5. AS and mean gradient stable since echo done 07/03/18. The aortic valve  has an indeterminant number of cusps. Aortic valve regurgitation is mild.  Mild aortic valve stenosis.   Patient Profile     Mr. 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    Assessment & Plan    # Unstable angina:  Mr. Kimball says he feels great today after PCI yesterday to the distal LMCA into the LCx. Now on ASA, Plavix and warfarin. INR 2.5 today- target is 2.5-3.5 given mechanical MVR.  # CAD s/p CABG:  as above- culprit for symptoms was likely the distal LM disease and has now been stented.  # Hyperlipidemia:   Pravastatin switched to rosuvastatin.   # Chronic systolic and diastolic heart failure: # Hypertension: LVEF 25-30% on echo 12/2019. He is s/p ICD for ischemic cardiomyopathy. Continue metoprolol and imdur. Lasix stopped yesterday. Getting gentle IV fluids as above.  # DM:  BG 340 at lunch today- restart glipizide 10 mg daily and Lantus 85U QHS - continue SSI. Hold metformin given AKI.   # Mechanical mitral valve: Warfarin on hold.  Continue heparin.  Will need to resume post cath. Mean gradient 4 mmHg on echo 12/2019. INR 1.8 today-  Will need therapeutic INR before d/c home given higher risk of MV  thrombosis in the mitral position.   #AKI: Acute increase in creatinine from 1.68 to 2.8 overnight - etiology is unclear, may be related to contrast dye and diuretics. Lasix held yesterday. Not on ACE/ARB. Right heart pressure was minimally elevated at 22 on cath. No s/s of CHF - reports making good urine, but it was dark. Send UA today.  Will need further inpatient monitoring despite a therapeutic INR, especially since his creatinine is rising.   For questions or updates, please contact [place] Please consult [email or web address] for contact info under   Pixie Casino, MD, FACC, [place] Director of the [facility] Diplomate of the American Board of Clinical Lipidology Attending Cardiologist  Direct Dial: [phone]  Fax: [phone]  Website:  [email or web address]  Pixie Casino, MD  06/26/2020, 12:22 PM

## 2020-06-27 ENCOUNTER — Encounter (HOSPITAL_COMMUNITY): Payer: Self-pay | Admitting: Internal Medicine

## 2020-06-27 DIAGNOSIS — Z794 Long term (current) use of insulin: Secondary | ICD-10-CM

## 2020-06-27 DIAGNOSIS — N141 Nephropathy induced by other drugs, medicaments and biological substances: Secondary | ICD-10-CM

## 2020-06-27 DIAGNOSIS — Z95810 Presence of automatic (implantable) cardiac defibrillator: Secondary | ICD-10-CM | POA: Diagnosis not present

## 2020-06-27 DIAGNOSIS — N1831 Chronic kidney disease, stage 3a: Secondary | ICD-10-CM

## 2020-06-27 DIAGNOSIS — E1122 Type 2 diabetes mellitus with diabetic chronic kidney disease: Secondary | ICD-10-CM

## 2020-06-27 DIAGNOSIS — I10 Essential (primary) hypertension: Secondary | ICD-10-CM | POA: Diagnosis not present

## 2020-06-27 DIAGNOSIS — R079 Chest pain, unspecified: Secondary | ICD-10-CM | POA: Diagnosis not present

## 2020-06-27 DIAGNOSIS — I5022 Chronic systolic (congestive) heart failure: Secondary | ICD-10-CM

## 2020-06-27 DIAGNOSIS — Z7901 Long term (current) use of anticoagulants: Secondary | ICD-10-CM

## 2020-06-27 DIAGNOSIS — I4819 Other persistent atrial fibrillation: Secondary | ICD-10-CM | POA: Diagnosis not present

## 2020-06-27 LAB — BASIC METABOLIC PANEL
Anion gap: 11 (ref 5–15)
BUN: 64 mg/dL — ABNORMAL HIGH (ref 8–23)
CO2: 21 mmol/L — ABNORMAL LOW (ref 22–32)
Calcium: 7.9 mg/dL — ABNORMAL LOW (ref 8.9–10.3)
Chloride: 103 mmol/L (ref 98–111)
Creatinine, Ser: 3.83 mg/dL — ABNORMAL HIGH (ref 0.61–1.24)
GFR, Estimated: 15 mL/min — ABNORMAL LOW (ref >60)
Glucose, Bld: 139 mg/dL — ABNORMAL HIGH (ref 70–99)
Potassium: 3.9 mmol/L (ref 3.5–5.1)
Sodium: 135 mmol/L (ref 135–145)

## 2020-06-27 LAB — CBC
HCT: 31.3 % — ABNORMAL LOW (ref 39.0–52.0)
Hemoglobin: 10.3 g/dL — ABNORMAL LOW (ref 13.0–17.0)
MCH: 29.3 pg (ref 26.0–34.0)
MCHC: 32.9 g/dL (ref 30.0–36.0)
MCV: 89.2 fL (ref 80.0–100.0)
Platelets: 154 10*3/uL (ref 150–400)
RBC: 3.51 MIL/uL — ABNORMAL LOW (ref 4.22–5.81)
RDW: 14.9 % (ref 11.5–15.5)
WBC: 5.7 10*3/uL (ref 4.0–10.5)
nRBC: 0 % (ref 0.0–0.2)

## 2020-06-27 LAB — PROTIME-INR
INR: 3 — ABNORMAL HIGH (ref 0.8–1.2)
Prothrombin Time: 30.5 seconds — ABNORMAL HIGH (ref 11.4–15.2)

## 2020-06-27 LAB — GLUCOSE, CAPILLARY
Glucose-Capillary: 68 mg/dL — ABNORMAL LOW (ref 70–99)
Glucose-Capillary: 150 mg/dL — ABNORMAL HIGH (ref 70–99)
Glucose-Capillary: 111 mg/dL — ABNORMAL HIGH (ref 70–99)
Glucose-Capillary: 150 mg/dL — ABNORMAL HIGH (ref 70–99)

## 2020-06-27 MED ORDER — WARFARIN SODIUM 5 MG PO TABS
5.0000 mg | ORAL_TABLET | Freq: Once | ORAL | Status: AC
  Administered 2020-06-27: 5 mg via ORAL
  Filled 2020-06-27: qty 1

## 2020-06-27 NOTE — Progress Notes (Addendum)
Progress Note  Patient Name: John [name] Date of Encounter: 06/27/2020  Primary Cardiologist: Minus Breeding, MD  Subjective   Doing well with no specific complaints. Ambulating often   Inpatient Medications    Scheduled Meds: . Chlorhexidine Gluconate Cloth  6 each Topical Daily  . clopidogrel  75 mg Oral Q breakfast  . glipiZIDE  10 mg Oral Daily  . insulin aspart  0-15 Units Subcutaneous TID WC  . insulin aspart  0-5 Units Subcutaneous QHS  . insulin glargine  85 Units Subcutaneous QHS  . isosorbide mononitrate  30 mg Oral Daily  . metoprolol  200 mg Oral QPC breakfast  . pantoprazole  40 mg Oral Daily  . rosuvastatin  40 mg Oral q1800  . sodium chloride flush  3 mL Intravenous Q12H  . Warfarin - Pharmacist Dosing Inpatient   Does not apply q1600   Continuous Infusions: . sodium chloride     PRN Meds: sodium chloride, acetaminophen, alum & mag hydroxide-simeth, ondansetron (ZOFRAN) IV, sodium chloride flush, zolpidem   Vital Signs    Vitals:   06/26/20 1619 06/26/20 1649 06/26/20 1940 06/27/20 0304  BP: 101/62 102/61 100/68   Pulse:   82 79  Resp: 16 14 16    Temp:   98 F (36.7 C) 98.7 F (37.1 C)  TempSrc:   Oral Oral  SpO2:   97% 98%  Weight:    96.6 kg  Height:        Intake/Output Summary (Last 24 hours) at 06/27/2020 0824 Last data filed at 06/27/2020 0305 Gross per 24 hour  Intake 720 ml  Output 184 ml  Net 536 ml   Filed Weights   06/25/20 0405 06/26/20 0427 06/27/20 0304  Weight: 93.9 kg 92.9 kg 96.6 kg    Physical Exam   General: Well developed, well nourished, NAD Neck: Negative for carotid bruits. No JVD Lungs:Clear to ausculation bilaterally. No wheezes, rales, or rhonchi. Breathing is unlabored. Cardiovascular: RRR with S1 S2. + murmur Abdomen: Soft, non-tender, non-distended. No obvious abdominal masses. Extremities: 1+ BLE edema. Radial pulses 2+ bilaterally Neuro: Alert and oriented. No focal deficits. No facial  asymmetry. MAE spontaneously. Psych: Responds to questions appropriately with normal affect.    Labs    Chemistry Recent Labs  Lab 06/25/20 0632 06/26/20 0226 06/27/20 0250  NA 137 135 135  K 3.9 3.8 3.9  CL 104 103 103  CO2 21* 19* 21*  GLUCOSE 206* 245* 139*  BUN 42* 54* 64*  CREATININE 1.68* 2.80* 3.83*  CALCIUM 8.4* 8.2* 7.9*  GFRNONAA 41* 22* 15*  ANIONGAP 12 13 11      Hematology Recent Labs  Lab 06/25/20 0632 06/26/20 0226 06/27/20 0250  WBC 6.9 5.6 5.7  RBC 4.03* 3.88* 3.51*  HGB 11.5* 11.1* 10.3*  HCT 36.0* 34.6* 31.3*  MCV 89.3 89.2 89.2  MCH 28.5 28.6 29.3  MCHC 31.9 32.1 32.9  RDW 14.8 14.7 14.9  PLT 136* 138* 154    Cardiac EnzymesNo results for input(s): TROPONINI in the last 168 hours. No results for input(s): TROPIPOC in the last 168 hours.   BNP Recent Labs  Lab 06/20/20 1207  BNP 2,291.0*     DDimer No results for input(s): DDIMER in the last 168 hours.   Radiology    No results found.  Telemetry    06/27/20 NSR- Personally Reviewed  ECG    No new tracing as of 06/27/20 - Personally Reviewed  Cardiac Studies   LHC 06/23/20:  Ost Cx lesion is 85% stenosed.  Mid LAD lesion is 100% stenosed.  Prox RCA lesion is 30% stenosed.  Prox RCA to Dist RCA lesion is 80% stenosed.  Origin lesion is 100% stenosed.  Prox LAD to Mid LAD lesion is 70% stenosed.  Severe native CAD with diffuse 7075% proximal LAD stenoses prior to total occlusion of the mid LAD; 80 to 90% ostial circumflex stenosis proximal to a high marginal/ramus intermediate length vessel; 30% proximal RCA stenosis with diffuse 80% mid to distal stenoses. There is collateralization to the PLA vessel from the left coronary circulation.  Patent LIMA to mid LAD.  Old occlusion of the vein graft which previously supplied the distal RCA.  Mild right heart pressure elevation. Mean PA pressure 22 mm.  Very mild aortic stenosis with a peak to peak gradient of 10  and mean gradient of 7.5 mmHg; AVA 2.4 cm   Diagnostic Dominance: Right   Echo 12/23/19: 1. EF remains severely depressed to slightly worse than echo done October  2019 . Left ventricular ejection fraction, by estimation, is 25 to 30%.  The left ventricle has severely decreased function. The left ventricle  demonstrates global hypokinesis. The  left ventricular internal cavity size was severely dilated. There is mild  left ventricular hypertrophy. Left ventricular diastolic parameters are  indeterminate.  2. Right ventricular systolic function is normal. The right ventricular  size is normal. There is moderately elevated pulmonary artery systolic  pressure.  3. Left atrial size was severely dilated.  4. Normal appearing mechanical bileaflet mitral valve replacement with no  PVL . The mitral valve has been repaired/replaced. No evidence of mitral  valve regurgitation.  5. AS and mean gradient stable since echo done 07/03/18. The aortic valve  has an indeterminant number of cusps. Aortic valve regurgitation is mild.  Mild aortic valve stenosis.   Patient Profile     69 y.o. male  CAD status post CABG (patent LIMA, occluded SVG to RCA) chronic systolic and diastolic heart failure (LVEF 25 to 30%), status post mechanical mitral valve replacement secondary to rheumatic heart disease, mild aortic stenosis, status post ICD, hypertension, persistent atrial fibrillation, and hyperlipidemia admitted with unstable angina.   Assessment & Plan    1. Unstable angina with known CAD s/p CABG: -S/P LHC 06/23/20 with stent intervention 06/24/20 to severe, calcified distal LMCA extending into the ostial and proximal LCx with successful PCI using orbital atherectomy and drug-eluting stent placement in the distal LMCA into the proximal LCx using a Resolute Onyx 2.75 x 26 mm drug-eluting stent.   -Continue Plavix, coumadin>>>no ASA  -No further chest pain   2. Acute on chronic kidney  disease: -Baseline creatinine appears to be in the 1.4-1.5 range however has peaked today at 3.83, likely in the setting of contrast induced nephrotoxicity from [facility] 06/23/20 and stent intervention 06/24/20 -Nephrology consulted today, 06/27/20 -Hold nephrotoxic meds -Poor UO -Net negative 2L  3. Chronic systolic and diastolic HF s/p ICD placement 02/19/20: -Echo 12/23/19 with LVEF at 25-30% and global hypokinesis, mild LVH, dilated LA, normal mitral valve replacement with no evidence of MR  -Normal functioning implant 05/20/20 -Does not appear fluid volume overloaded on exam -Concerning given poor UO with climbing creatinine>>>worrisome for contrast induced nephrotoxicity>>renal consulted    4. DM2: -HbA1c, 7.0 on 06/20/20 -Continue SSI for glucose control while inpatient status   5. Mechanical Mitral Valve: -INR, 3.0 06/27/20 -On Coumadin -OP INR per PCP   6. HLD: -Last LDL, 65>>at goal of  less than 70 -Continue statin therapy   Signed, Kathyrn Drown NP-C [facility] Pager: [phone] 06/27/2020, 8:24 AM     For questions or updates, please contact   Please consult [email or web address] for contact info under Cardiology/STEMI.  I have seen and examined the patient along with Kathyrn Drown NP-C.  I have reviewed the chart, notes and new data.  I agree with PA/NP's note.  Key new complaints: no angina or dyspnea, but UO is markedly decreased Key examination changes: no overt hypervolemia, irregular rate controlled rhythm, crisp prosthetic valve clicks without murmur, no rales/JVD/edema Key new findings / data: rapid and steady increase in creatinine  PLAN: Nephrology consultation. Daily labs and daily weights. Strict in/out. Stop all potentially nephrotoxic agents. Discussed clinical evolution/prognosis of contrast nephrotoxicity.  Sanda Klein, MD, [place] [phone] 06/27/2020, 11:51 AM

## 2020-06-27 NOTE — Progress Notes (Signed)
Pt refused to take all of prescribed Lantus, agreed to take 42.5. Pt stated that he normally only takes 70 Units of Lantus each night and that his sugar almost bottomed out this a.m. Documented in [facility], will continue to monitor.   Elaina Hoops, RN

## 2020-06-27 NOTE — Care Management Important Message (Signed)
Important Message  Patient Details  Name: John [name] MRN: [id number] Date of Birth: 10/07/1950   Medicare Important Message Given:  Yes     [name] 06/27/2020, 11:45 AM

## 2020-06-27 NOTE — Consult Note (Signed)
[name] Admit Date: 06/20/2020 06/27/2020 Gean Quint Requesting Physician:  Burnell Blanks,* Reason for Consult:  aki  HPI:  She is a 69-year-old male with past medical history of coronary disease status post CABG, mechanical mitral valve secondary to rheumatic heart disease on Coumadin, mild aortic stenosis, hyperlipidemia, diabetes, hypertension, A. fib presents here for angina.  Underwent left heart cath on 10/14 (100 cc of contrast used) eventually underwent PCI on 06/24/2020 (100 cc of contrast used).  Since then patient has been feeling better and has been chest pain-free.  Does have baseline creatinine around 1.4-1.5, however creatinine started to rise on 10/17 at 2.8 now to 3.8.  He does report diminished urinary frequency otherwise denies any fevers, chest pain, shortness of breath, dysgeusia, nausea/vomiting/appetite, brain fog, tremors.  PMH Incudes: Past Medical History:  Diagnosis Date  . Allergy   . Anemia    "lost blood w/the cancer"  . Arthritis    back   . Atrial fibrillation ([place])    takes Coumadin daily  . Bruises easily    takes Coumadin daily  . CAD (coronary artery disease)    a. s/p CABG 11/2000 (L-LAD, S-RCA);  b. Lexiscan Myoview 11/12: EF 45%, ischemia and possibly some scar at the base of the inferolateral wall;   c. Lex MV 11/13:  EF 44%, Inf and IL scar/soft tissue atten, small mild amt of inf ischemia,  . CHF (congestive heart failure) ([place])   . Constipation    takes Colace daily  . Enlarged prostate   . Flu 09/2013  . GERD (gastroesophageal reflux disease)    takes Nexium daily  . Glaucoma   . History of blood transfusion    "related to cancer"  . History of colon polyps   . HLD (hyperlipidemia)    takes Vytorin daily  . HTN (hypertension)    takes Amlodipine,Metoprolol, and Ramipril daily  . Insomnia    states he has always been like this.But doesn't take any meds  . Kidney stones    "I passed them all"  . Nocturia   .  Peripheral edema    takes Furosemide daily  . Peripheral neuropathy   . Rheumatic fever ~ 1965  . Rheumatic heart disease    a. s/p mechanical (St. Jude) MVR 2002;  b. Echo 5/11: Mild LVH, EF 45-50%, mild AS, mild AI, mean gradient 11 mmHg, MVR with normal gradients, moderate LAE, mild RAE;  c.  Echo 11/13:   mod LVH, EF 55-60%, mild AS (mean 18 mmHg), mild AI, severe LAE, mild RAE, PASP 41  . 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 SOB (shortness of breath)    with exertion  . Type II diabetes mellitus ([place])    takes Januvia,Glipizide,and Metformin daily as well as Lantus       Creatinine (mg/dL)  Date Value  07/07/2019 1.40 (H)  07/08/2018 1.59 (H)  07/09/2017 1.4 (H)  01/03/2017 1.4 (H)  07/05/2016 1.4 (H)  12/22/2015 1.4 (H)  06/21/2015 1.4 (H)  01/18/2015 1.7 (H)  08/17/2014 1.3  04/27/2014 1.5 (H)  12/22/2013 1.3  10/09/2013 1.5 (H)   Creatinine, Ser (mg/dL)  Date Value  06/27/2020 3.83 (H)  06/26/2020 2.80 (H)  06/25/2020 1.68 (H)  06/24/2020 1.61 (H)  06/23/2020 1.54 (H)  06/22/2020 1.37 (H)  06/21/2020 1.44 (H)  06/20/2020 1.51 (H)  05/24/2020 1.48 (H)  05/19/2020 1.33 (H)  ] I/Os:  ROS Balance of 12 systems  is negative w/ exceptions as above  PMH  Past Medical History:  Diagnosis Date  . Allergy   . Anemia    "lost blood w/the cancer"  . Arthritis    back   . Atrial fibrillation ([place])    takes Coumadin daily  . Bruises easily    takes Coumadin daily  . CAD (coronary artery disease)    a. s/p CABG 11/2000 (L-LAD, S-RCA);  b. Lexiscan Myoview 11/12: EF 45%, ischemia and possibly some scar at the base of the inferolateral wall;   c. Lex MV 11/13:  EF 44%, Inf and IL scar/soft tissue atten, small mild amt of inf ischemia,  . CHF (congestive heart failure) ([place])   . Constipation    takes Colace daily  . Enlarged prostate   . Flu 09/2013  . GERD (gastroesophageal reflux disease)    takes Nexium daily  .  Glaucoma   . History of blood transfusion    "related to cancer"  . History of colon polyps   . HLD (hyperlipidemia)    takes Vytorin daily  . HTN (hypertension)    takes Amlodipine,Metoprolol, and Ramipril daily  . Insomnia    states he has always been like this.But doesn't take any meds  . Kidney stones    "I passed them all"  . Nocturia   . Peripheral edema    takes Furosemide daily  . Peripheral neuropathy   . Rheumatic fever ~ 1965  . Rheumatic heart disease    a. s/p mechanical (St. Jude) MVR 2002;  b. Echo 5/11: Mild LVH, EF 45-50%, mild AS, mild AI, mean gradient 11 mmHg, MVR with normal gradients, moderate LAE, mild RAE;  c.  Echo 11/13:   mod LVH, EF 55-60%, mild AS (mean 18 mmHg), mild AI, severe LAE, mild RAE, PASP 41  . 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 SOB (shortness of breath)    with exertion  . Type II diabetes mellitus ([place])    takes Januvia,Glipizide,and Metformin daily as well as Lantus   PSH  Past Surgical History:  Procedure Laterality Date  . APPLICATION OF WOUND VAC  2014  . CARDIAC CATHETERIZATION  2002  . COLONOSCOPY    . CORONARY ARTERY BYPASS GRAFT  2002   x 3  . CORONARY ARTERY BYPASS GRAFT    . CORONARY ATHERECTOMY N/A 06/24/2020   Procedure: CORONARY ATHERECTOMY;  Surgeon: [name], MD;  Location: [facility];  Service: Cardiovascular;  Laterality: N/A;  . CORONARY BALLOON ANGIOPLASTY N/A 06/24/2020   Procedure: CORONARY BALLOON ANGIOPLASTY;  Surgeon: [name], MD;  Location: [facility];  Service: Cardiovascular;  Laterality: N/A;  . CORONARY STENT INTERVENTION N/A 06/24/2020   Procedure: CORONARY STENT INTERVENTION;  Surgeon: [name], MD;  Location: [place] [facility];  Service: Cardiovascular;  Laterality: N/A;  . ESOPHAGOGASTRODUODENOSCOPY    . HERNIA REPAIR    . ICD IMPLANT N/A 02/19/2020   Procedure: ICD IMPLANT;  Surgeon: Evans Lance, MD;  Location:  [place] [facility];  Service: Cardiovascular;  Laterality: N/A;  . INCISIONAL HERNIA REPAIR N/A 03/15/2014   Procedure: LAPAROSCOPIC INCISIONAL HERNIA;  Surgeon: Harl Bowie, MD;  Location: [facility];  Service: General;  Laterality: N/A;  . INSERTION OF MESH N/A 03/15/2014   Procedure: INSERTION OF MESH;  Surgeon: Harl Bowie, MD;  Location: [facility];  Service: General;  Laterality: N/A;  . LAPAROSCOPIC INCISIONAL / UMBILICAL /  VENTRAL HERNIA REPAIR  03/15/2014   IHR  . LAPAROTOMY N/A 02/09/2013   Procedure: EXPLORATORY LAPAROTOMY WITH incisional biopsy intra- ABDOMINAL MASS ILOCECTOMY ;  Surgeon: Harl Bowie, MD;  Location: [facility];  Service: General;  Laterality: N/A;  . MITRAL VALVE REPLACEMENT  11/2000   #33 St. Jude mechanical mitral valve prosthesis. Dr. Servando Snare  . PORT-A-CATH REMOVAL  03/15/2014  . PORT-A-CATH REMOVAL Left 03/15/2014   Procedure: REMOVAL PORT-A-CATH;  Surgeon: Harl Bowie, MD;  Location: [facility];  Service: General;  Laterality: Left;  . PORTACATH PLACEMENT N/A 02/09/2013   Procedure: INSERTION PORT-A-CATH;  Surgeon: Harl Bowie, MD;  Location: [facility];  Service: General;  Laterality: N/A;  . RIGHT/LEFT HEART CATH AND CORONARY/GRAFT ANGIOGRAPHY N/A 05/10/2017   Procedure: RIGHT/LEFT HEART CATH AND CORONARY/GRAFT ANGIOGRAPHY;  Surgeon: Martinique, Peter M, MD;  Location: [facility];  Service: Cardiovascular;  Laterality: N/A;  . RIGHT/LEFT HEART CATH AND CORONARY/GRAFT ANGIOGRAPHY N/A 06/23/2020   Procedure: RIGHT/LEFT HEART CATH AND CORONARY/GRAFT ANGIOGRAPHY;  Surgeon: Troy Sine, MD;  Location: [facility];  Service: Cardiovascular;  Laterality: N/A;  . SEPTOPLASTY    . SMALL INTESTINE SURGERY     FH  Family History  Problem Relation Age of Onset  . Diabetes Mother   . Hypertension Father   . Thyroid disease Sister   . Diabetes Brother   . Arthritis Brother   . Depression Brother   . Heart disease Brother   . Hyperlipidemia Brother    . Hypertension Brother   . Hypertension Brother   . Diabetes Brother   . Heart disease Brother   . Hyperlipidemia Brother    SH  reports that he has quit smoking. His smoking use included cigars. He quit after 40.00 years of use. He has never used smokeless tobacco. He reports that he does not drink alcohol and does not use drugs. Allergies  Allergies  Allergen Reactions  . Doxycycline Hives   Home medications Prior to Admission medications   Medication Sig Start Date End Date Taking? Authorizing Provider  aspirin 81 MG chewable tablet Chew 81 mg by mouth daily.   Yes [provider]  dexlansoprazole (DEXILANT) 60 MG capsule Take 1 capsule (60 mg total) by mouth daily. 06/01/20  Yes Stacks, Cletus Gash, MD  furosemide (LASIX) 40 MG tablet TAKE 1 TABLET BY MOUTH EVERY DAY Patient taking differently: Take 20 mg by mouth daily.  05/13/20  Yes Claretta Fraise, MD  glipiZIDE (GLUCOTROL XL) 10 MG 24 hr tablet Take 1 tablet (10 mg total) by mouth daily. 06/01/20  Yes Claretta Fraise, MD  insulin glargine (LANTUS) 100 UNIT/ML injection Inject 0.85 mLs (85 Units total) into the skin 2 (two) times daily as needed (takes 85 units every night at bedtime 2 nd dose only if needed for elevated blood sugars). Patient taking differently: Inject 85 Units into the skin 2 (two) times daily as needed (only if needed for elevated blood sugars).  06/01/20  Yes Claretta Fraise, MD  metFORMIN (GLUCOPHAGE) 500 MG tablet Take 1 tablet (500 mg total) by mouth 2 (two) times daily with a meal. 05/19/20  Yes Claretta Fraise, MD  metoprolol (TOPROL-XL) 200 MG 24 hr tablet Take 1 tablet (200 mg total) by mouth daily after breakfast. 05/19/20  Yes Stacks, Cletus Gash, MD  sacubitril-valsartan (ENTRESTO) 97-103 MG Take 1 tablet by mouth 2 (two) times daily. 06/01/20  Yes Stacks, Cletus Gash, MD  sitaGLIPtin (JANUVIA) 50 MG tablet Take 1 tablet (50 mg  total) by mouth daily. 06/01/20  Yes Stacks, Cletus Gash, MD  triamcinolone cream (KENALOG) 0.1 %  Apply 1 application topically 2 (two) times daily. Avoid face and genitalia 07/27/19  Yes Claretta Fraise, MD  warfarin (COUMADIN) 5 MG tablet Take 1 tablet (5 mg total) by mouth daily. 05/19/20  Yes Stacks, Cletus Gash, MD  glucose blood (ONETOUCH VERIO) test strip USE TO TEST TWICE A DAY 12/07/19   Claretta Fraise, MD  Misc Natural Products (OSTEO BI-FLEX ADV TRIPLE ST) TABS Take 1 tablet by mouth 2 (two) times a week.     [provider]  nitroGLYCERIN (NITROSTAT) 0.4 MG SL tablet Place 1 tablet (0.4 mg total) under the tongue every 5 (five) minutes as needed for chest pain. 09/26/19   Claretta Fraise, MD  OneTouch Delica Lancets 67E MISC 1 each by Other route 2 (two) times daily. Dx E11.9 10/27/19   Claretta Fraise, MD  pravastatin (PRAVACHOL) 80 MG tablet Take 1 tablet (80 mg total) by mouth daily. 06/20/20   Claretta Fraise, MD    Current Medications Scheduled Meds: . Chlorhexidine Gluconate Cloth  6 each Topical Daily  . clopidogrel  75 mg Oral Q breakfast  . glipiZIDE  10 mg Oral Daily  . insulin aspart  0-15 Units Subcutaneous TID WC  . insulin aspart  0-5 Units Subcutaneous QHS  . insulin glargine  85 Units Subcutaneous QHS  . isosorbide mononitrate  30 mg Oral Daily  . metoprolol  200 mg Oral QPC breakfast  . pantoprazole  40 mg Oral Daily  . rosuvastatin  40 mg Oral q1800  . sodium chloride flush  3 mL Intravenous Q12H  . Warfarin - Pharmacist Dosing Inpatient   Does not apply q1600   Continuous Infusions: . sodium chloride     PRN Meds:.sodium chloride, acetaminophen, alum & mag hydroxide-simeth, ondansetron (ZOFRAN) IV, sodium chloride flush, zolpidem  CBC Recent Labs  Lab 06/25/20 0632 06/26/20 0226 06/27/20 0250  WBC 6.9 5.6 5.7  HGB 11.5* 11.1* 10.3*  HCT 36.0* 34.6* 31.3*  MCV 89.3 89.2 89.2  PLT 136* 138* 938   Basic Metabolic Panel Recent Labs  Lab 06/21/20 1010 06/21/20 1010 06/22/20 0618 06/22/20 0618 06/23/20 0135 06/23/20 1602 06/23/20 1611  06/24/20 0206 06/25/20 0632 06/26/20 0226 06/27/20 0250  NA 139   < > 140   < > 140 143  143 142 139 137 135 135  K 4.4   < > 4.6   < > 4.2 4.3  4.2 4.2 4.2 3.9 3.8 3.9  CL 107  --  108  --  107  --   --  105 104 103 103  CO2 22  --  23  --  25  --   --  23 21* 19* 21*  GLUCOSE 156*  --  127*  --  141*  --   --  209* 206* 245* 139*  BUN 56*  --  52*  --  49*  --   --  45* 42* 54* 64*  CREATININE 1.44*  --  1.37*  --  1.54*  --   --  1.61* 1.68* 2.80* 3.83*  CALCIUM 9.0  --  9.0  --  8.7*  --   --  8.7* 8.4* 8.2* 7.9*   < > = values in this interval not displayed.    Physical Exam  Blood pressure (!) 112/59, pulse 74, temperature (!) 97.4 F (36.3 C), temperature source Oral, resp. rate 17, height 5\' 11"  (1.803 m), weight 96.6  kg, SpO2 100 %. GEN: nad ENT: no nasal discharge, mmm EYES: no scleral icterus, eomi CV: normal rate, no murmurs PULM: cta bl, no w/r/r/c, unlabored, bl chest expansion ABD: NABS, non-distended SKIN: no rashes or jaundice EXT: no edema, warm and well perfused Neuro: nonfocal, speech clear and coherent  Assessment 1. Unstable Angina, h/o CAD s/p CABG. S/P LHC 06/23/2020, PCI 06/24/2020 2. AKI on CKD3. 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 Suspecting AKI is secondary to contrast induced kidney injury in the setting of receiving Entresto. 3. Chronic systolic and diastolic HF, EF 75-17%. Euvolemic on exam 4. MVR on coumadin 5. Anemia, mgmt per primary service 6. DM2, mgmt per primary 7. HTN, softer BP's, holding anti-htn's  Plan 1. At this point, supportive care will be key moving forward, keep MAPs at goal to maintain adequate renal perfusion 2. Agree with continue to hold entresto and diuretics (unless clinically indicated) 3. Renal artery duplex and ultrasound 4. No indication for renal replacement therapy at this junction. 5. Avoid nephrotoxic medications including NSAIDs and iodinated  intravenous contrast exposure unless the latter is absolutely indicated.  Preferred narcotic agents for pain control are hydromorphone, fentanyl, and methadone. Morphine should not be used. Avoid Baclofen and avoid oral sodium phosphate and magnesium citrate based laxatives / bowel preps. Continue strict Input and Output monitoring. Will monitor the patient closely with you and intervene or adjust therapy as indicated by changes in clinical status/labs   Plans communicated to the primary team    Gean Quint, MD [place] 06/27/2020, 10:50 AM

## 2020-06-27 NOTE — Progress Notes (Signed)
[place] for heparin + warfarin Indication: mechanical mitral valve/afib  Allergies  Allergen Reactions  . Doxycycline Hives    Patient Measurements: Height: 5\' 11"  (180.3 cm) Weight: 96.6 kg (213 lb) IBW/kg (Calculated) : 75.3 HEPARIN DW (KG): 94.1  Vital Signs: Temp: 97.4 F (36.3 C) (10/18 1011) Temp Source: Oral (10/18 1011) BP: 112/59 (10/18 1011) Pulse Rate: 74 (10/18 1016)  Labs: Recent Labs    06/25/20 5809 06/25/20 0632 06/25/20 1451 06/25/20 2057 06/26/20 0226 06/27/20 0250  HGB 11.5*   < >  --   --  11.1* 10.3*  HCT 36.0*  --   --   --  34.6* 31.3*  PLT 136*  --   --   --  138* 154  LABPROT 20.2*  --   --   --  26.0* 30.5*  INR 1.8*  --   --   --  2.5* 3.0*  HEPARINUNFRC 0.23*   < > 0.41 0.41 0.43  --   CREATININE 1.68*  --   --   --  2.80* 3.83*   < > = values in this interval not displayed.    Estimated Creatinine Clearance: 22.2 mL/min (A) (by C-G formula based on SCr of 3.83 mg/dL (H)).   Assessment: Patient with chest pain concerning for unstable angina. Chronically anticoagulated with warfarin (5 mg daily) for mechanical mitral valve and afib. Pt s/p cath on 10/15 and IV heparin and warfarin were resumed. -INR= 3 (trend up  Goal of Therapy:  Heparin level 0.3-0.7 units/ml Monitor platelets by anticoagulation protocol: Yes  Goal INR is 2.5-3.5    Plan:  -Warfarin 5mg  PO x1 tonight -Daily INR  Hildred Laser, PharmD Clinical Pharmacist **Pharmacist phone directory can now be found on [email or web address] ([email or web address]).  Listed under [place].

## 2020-06-27 NOTE — Progress Notes (Signed)
Inpatient Diabetes Program Recommendations  AACE/ADA: New Consensus Statement on Inpatient Glycemic Control (2015)  Target Ranges:  Prepandial:   less than 140 mg/dL      Peak postprandial:   less than 180 mg/dL (1-2 hours)      Critically ill patients:  140 - 180 mg/dL   Lab Results  Component Value Date   GLUCAP 68 (L) 06/27/2020   HGBA1C 7.0 (H) 06/20/2020    Review of Glycemic Control Results for John [name], John [name] (MRN [id number]) as of 06/27/2020 09:58  Ref. Range 06/26/2020 07:34 06/26/2020 12:00 06/26/2020 17:22 06/26/2020 20:41 06/27/2020 07:37  Glucose-Capillary Latest Ref Range: 70 - 99 mg/dL 193 (H) 340 (H) 142 (H) 188 (H) 68 (L)   CP Diabetes history: DM 2 Outpatient Diabetes medications: Glipizide 10 mg Daily, Lantus 85 units bid, Metformin 500 mg bid, Januvia 50 mg Daily Current orders for Inpatient glycemic control:  Lantus 85 units qhs, Glipizide 10 mg Daily, Novolog 0-15 units tid + hs  A1c 7% on 10/11  Inpatient Diabetes Program Recommendations:    Mild hypoglycemia 68 this am.  -  Reduce Lantus to 80 units.  Thanks,  Tama Headings RN, MSN, BC-ADM Inpatient Diabetes Coordinator Team Pager [phone] (8a-5p)

## 2020-06-27 NOTE — Progress Notes (Signed)
CARDIAC REHAB PHASE I   PRE:  Rate/Rhythm: 85 SR  BP:  Supine:   Sitting:   Standing: 101/51   SaO2:   MODE:  Ambulation: 500 ft   POST:  Rate/Rhythm: 90 SR  BP:  Supine:   Sitting:   Standing: 90/50   SaO2: 98%RA [id number] Pt walked 500 ft on RA with steady gait. No CP or dizziness. BP a little lower after walk. Reinforced that pt is to take plavix and to increase distance of walking slowly. No questions re ed done Saturday.   Graylon Good, RN BSN  06/27/2020 8:49 AM

## 2020-06-28 ENCOUNTER — Inpatient Hospital Stay (HOSPITAL_COMMUNITY): Payer: PPO

## 2020-06-28 ENCOUNTER — Encounter (HOSPITAL_COMMUNITY): Payer: PPO

## 2020-06-28 DIAGNOSIS — Z95810 Presence of automatic (implantable) cardiac defibrillator: Secondary | ICD-10-CM | POA: Diagnosis not present

## 2020-06-28 DIAGNOSIS — R079 Chest pain, unspecified: Secondary | ICD-10-CM | POA: Diagnosis not present

## 2020-06-28 DIAGNOSIS — I4819 Other persistent atrial fibrillation: Secondary | ICD-10-CM | POA: Diagnosis not present

## 2020-06-28 DIAGNOSIS — I10 Essential (primary) hypertension: Secondary | ICD-10-CM | POA: Diagnosis not present

## 2020-06-28 LAB — URINALYSIS, ROUTINE W REFLEX MICROSCOPIC
Bilirubin Urine: NEGATIVE
Glucose, UA: NEGATIVE mg/dL
Hgb urine dipstick: NEGATIVE
Ketones, ur: NEGATIVE mg/dL
Leukocytes,Ua: NEGATIVE
Nitrite: NEGATIVE
Protein, ur: NEGATIVE mg/dL
Specific Gravity, Urine: 1.012 (ref 1.005–1.030)
pH: 5 (ref 5.0–8.0)

## 2020-06-28 LAB — BASIC METABOLIC PANEL
Anion gap: 11 (ref 5–15)
BUN: 79 mg/dL — ABNORMAL HIGH (ref 8–23)
CO2: 22 mmol/L (ref 22–32)
Calcium: 8 mg/dL — ABNORMAL LOW (ref 8.9–10.3)
Chloride: 99 mmol/L (ref 98–111)
Creatinine, Ser: 4.75 mg/dL — ABNORMAL HIGH (ref 0.61–1.24)
GFR, Estimated: 12 mL/min — ABNORMAL LOW (ref >60)
Glucose, Bld: 90 mg/dL (ref 70–99)
Potassium: 4.4 mmol/L (ref 3.5–5.1)
Sodium: 132 mmol/L — ABNORMAL LOW (ref 135–145)

## 2020-06-28 LAB — GLUCOSE, CAPILLARY
Glucose-Capillary: 65 mg/dL — ABNORMAL LOW (ref 70–99)
Glucose-Capillary: 146 mg/dL — ABNORMAL HIGH (ref 70–99)
Glucose-Capillary: 133 mg/dL — ABNORMAL HIGH (ref 70–99)
Glucose-Capillary: 223 mg/dL — ABNORMAL HIGH (ref 70–99)
Glucose-Capillary: 130 mg/dL — ABNORMAL HIGH (ref 70–99)

## 2020-06-28 LAB — CK: Total CK: 68 U/L (ref 49–397)

## 2020-06-28 LAB — PROTIME-INR
INR: 3.6 — ABNORMAL HIGH (ref 0.8–1.2)
Prothrombin Time: 34.6 seconds — ABNORMAL HIGH (ref 11.4–15.2)

## 2020-06-28 MED ORDER — DEXTROSE 50 % IV SOLN
INTRAVENOUS | Status: AC
  Filled 2020-06-28: qty 50

## 2020-06-28 MED ORDER — DEXTROSE 50 % IV SOLN
12.5000 g | INTRAVENOUS | Status: AC
  Administered 2020-06-28: 25 g via INTRAVENOUS

## 2020-06-28 MED ORDER — ATORVASTATIN CALCIUM 80 MG PO TABS
80.0000 mg | ORAL_TABLET | Freq: Every day | ORAL | Status: DC
  Administered 2020-06-28: 80 mg via ORAL
  Filled 2020-06-28 (×2): qty 1

## 2020-06-28 MED ORDER — INSULIN GLARGINE 100 UNIT/ML ~~LOC~~ SOLN
75.0000 [IU] | Freq: Every day | SUBCUTANEOUS | Status: DC
  Filled 2020-06-28 (×3): qty 0.75

## 2020-06-28 NOTE — Progress Notes (Signed)
[name] Progress Note    Assessment/ Plan:   1.  Unstable angina, history of coronary disease status post CABG.  Status post LHC 10/14 and PCI 10/15.  100 cc of contrast used for each procedure.  Now chest pain-free.  Management per cardiology 2.  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  AKI likely related to contrast-induced kidney injury whilst receiving Entresto. -Renal ultrasound, checking CK levels -Repeat UA -Renal function has not peaked yet.  No indication for renal replacement therapy at this junction. Continue to hold entresto 3.  Chronic systolic and diastolic heart failure, EF 25 to 30%.  Remains euvolemic 4.  Anemia, management per primary service.  Transfuse as needed 5.  History of hypertension now relatively hypotensive.  Antihypertensives on hold 6.  Diabetes mellitus type 2.  Management per primary service  Subjective:   No complaints.  Had issues with hypoglycemia.  Creatinine worsened to 4.8 patient reports a still urinating normally.  Denies any nausea/vomiting, loss of appetite, dysgeusia, pruritus, hiccups, chest pain, shortness of breath, orthopnea, swelling, episodes of confusion.   Objective:   BP (!) 104/51 (BP Location: Left Arm)    Pulse 73    Temp 97.7 F (36.5 C) (Oral)    Resp 16    Ht 5\' 11"  (1.803 m)    Wt 98.2 kg    SpO2 96%    BMI 30.18 kg/m   Intake/Output Summary (Last 24 hours) at 06/28/2020 1235 Last data filed at 06/27/2020 1941 Gross per 24 hour  Intake --  Output 150 ml  Net -150 ml   Weight change: 1.542 kg  Physical Exam: Gen: No acute distress CVS: S1-S2 regular rate and rhythm Resp: Clear to auscultation bilaterally, no W/R/R/C, unlabored, bilateral chest expansion Abd: Soft, nontender, nondistended Ext: No edema Skin: No rash Neuro: Speech clear and coherent, moves all extremities spontaneously, no asterixis  Imaging: No  results found.  Labs: BMET Recent Labs  Lab 06/22/20 0618 06/22/20 0618 06/23/20 0135 06/23/20 0135 06/23/20 1602 06/23/20 1611 06/24/20 0206 06/25/20 7035 06/26/20 0226 06/27/20 0250 06/28/20 0559  NA 140   < > 140   < > 143   143 142 139 137 135 135 132*  K 4.6   < > 4.2   < > 4.3   4.2 4.2 4.2 3.9 3.8 3.9 4.4  CL 108  --  107  --   --   --  105 104 103 103 99  CO2 23  --  25  --   --   --  23 21* 19* 21* 22  GLUCOSE 127*  --  141*  --   --   --  209* 206* 245* 139* 90  BUN 52*  --  49*  --   --   --  45* 42* 54* 64* 79*  CREATININE 1.37*  --  1.54*  --   --   --  1.61* 1.68* 2.80* 3.83* 4.75*  CALCIUM 9.0  --  8.7*  --   --   --  8.7* 8.4* 8.2* 7.9* 8.0*   < > = values in this interval not displayed.   CBC Recent Labs  Lab 06/24/20 0206 06/25/20 0632 06/26/20 0226 06/27/20 0250  WBC 7.0 6.9 5.6 5.7  HGB 11.9* 11.5* 11.1* 10.3*  HCT 37.3* 36.0* 34.6* 31.3*  MCV 89.7 89.3 89.2 89.2  PLT 151 136* 138* 154    Medications:  clopidogrel  75 mg Oral Q breakfast   glipiZIDE  10 mg Oral Daily   insulin aspart  0-15 Units Subcutaneous TID WC   insulin aspart  0-5 Units Subcutaneous QHS   insulin glargine  75 Units Subcutaneous QHS   isosorbide mononitrate  30 mg Oral Daily   metoprolol  200 mg Oral QPC breakfast   pantoprazole  40 mg Oral Daily   rosuvastatin  40 mg Oral q1800   sodium chloride flush  3 mL Intravenous Q12H   Warfarin - Pharmacist Dosing Inpatient   Does not apply [place], MD [facility] 06/28/2020, 12:35 PM

## 2020-06-28 NOTE — Progress Notes (Addendum)
Progress Note  Patient Name: ROBBI SCURLOCK Date of Encounter: 06/28/2020  [facility] Cardiologist: Minus Breeding, MD   Subjective   Creatinine is up today at 4.75. Patient denies CP or SOB. Euvolemic on exam. Pressure soft, hold AM antihypertensives.   Inpatient Medications    Scheduled Meds: . Chlorhexidine Gluconate Cloth  6 each Topical Daily  . clopidogrel  75 mg Oral Q breakfast  . glipiZIDE  10 mg Oral Daily  . insulin aspart  0-15 Units Subcutaneous TID WC  . insulin aspart  0-5 Units Subcutaneous QHS  . insulin glargine  85 Units Subcutaneous QHS  . isosorbide mononitrate  30 mg Oral Daily  . metoprolol  200 mg Oral QPC breakfast  . pantoprazole  40 mg Oral Daily  . rosuvastatin  40 mg Oral q1800  . sodium chloride flush  3 mL Intravenous Q12H  . Warfarin - Pharmacist Dosing Inpatient   Does not apply q1600   Continuous Infusions: . sodium chloride     PRN Meds: sodium chloride, acetaminophen, alum & mag hydroxide-simeth, ondansetron (ZOFRAN) IV, sodium chloride flush, zolpidem   Vital Signs    Vitals:   06/27/20 1519 06/27/20 1622 06/27/20 2005 06/28/20 0337  BP: (!) 104/55  (!) 98/55 (!) 99/57  Pulse: 73  69 69  Resp: 16 16 15 16   Temp: 99.1 F (37.3 C)  97.8 F (36.6 C) 97.9 F (36.6 C)  TempSrc: Oral  Oral Oral  SpO2: 99%  98% 96%  Weight:    98.2 kg  Height:        Intake/Output Summary (Last 24 hours) at 06/28/2020 0753 Last data filed at 06/27/2020 1941 Gross per 24 hour  Intake --  Output 275 ml  Net -275 ml   Last 3 Weights 06/28/2020 06/27/2020 06/26/2020  Weight (lbs) 216 lb 6.4 oz 213 lb 204 lb 11.9 oz  Weight (kg) 98.158 kg 96.616 kg 92.87 kg      Telemetry    Afib HR 50-60s, PVCs - Personally Reviewed  ECG    No new - Personally Reviewed  Physical Exam   GEN: No acute distress.   Neck: No JVD Cardiac: Irreg Irreg, no murmurs, rubs, or gallops.  Respiratory: Clear to auscultation bilaterally. GI: Soft,  nontender, non-distended  MS: No edema; No deformity. Neuro:  Nonfocal  Psych: Normal affect   Labs    High Sensitivity Troponin:   Recent Labs  Lab 06/20/20 1207 06/20/20 1448  TROPONINIHS 14 16      Chemistry Recent Labs  Lab 06/26/20 0226 06/27/20 0250 06/28/20 0559  NA 135 135 132*  K 3.8 3.9 4.4  CL 103 103 99  CO2 19* 21* 22  GLUCOSE 245* 139* 90  BUN 54* 64* 79*  CREATININE 2.80* 3.83* 4.75*  CALCIUM 8.2* 7.9* 8.0*  GFRNONAA 22* 15* 12*  ANIONGAP 13 11 11      Hematology Recent Labs  Lab 06/25/20 0632 06/26/20 0226 06/27/20 0250  WBC 6.9 5.6 5.7  RBC 4.03* 3.88* 3.51*  HGB 11.5* 11.1* 10.3*  HCT 36.0* 34.6* 31.3*  MCV 89.3 89.2 89.2  MCH 28.5 28.6 29.3  MCHC 31.9 32.1 32.9  RDW 14.8 14.7 14.9  PLT 136* 138* 154    BNPNo results for input(s): BNP, PROBNP in the last 168 hours.   DDimer No results for input(s): DDIMER in the last 168 hours.   Radiology    No results found.  Cardiac Studies    LHC 06/23/20:  Ost Cx  lesion is 85% stenosed.  Mid LAD lesion is 100% stenosed.  Prox RCA lesion is 30% stenosed.  Prox RCA to Dist RCA lesion is 80% stenosed.  Origin lesion is 100% stenosed.  Prox LAD to Mid LAD lesion is 70% stenosed.  Severe native CAD with diffuse 7075% proximal LAD stenoses prior to total occlusion of the mid LAD; 80 to 90% ostial circumflex stenosis proximal to a high marginal/ramus intermediate length vessel; 30% proximal RCA stenosis with diffuse 80% mid to distal stenoses. There is collateralization to the PLA vessel from the left coronary circulation.  Patent LIMA to mid LAD.  Old occlusion of the vein graft which previously supplied the distal RCA.  Mild right heart pressure elevation. Mean PA pressure 22 mm.  Very mild aortic stenosis with a peak to peak gradient of 10 and mean gradient of 7.5 mmHg; AVA 2.4 cm   Diagnostic Dominance: Right   Echo 12/23/19: 1. EF remains severely depressed to  slightly worse than echo done October  2019 . Left ventricular ejection fraction, by estimation, is 25 to 30%.  The left ventricle has severely decreased function. The left ventricle  demonstrates global hypokinesis. The  left ventricular internal cavity size was severely dilated. There is mild  left ventricular hypertrophy. Left ventricular diastolic parameters are  indeterminate.  2. Right ventricular systolic function is normal. The right ventricular  size is normal. There is moderately elevated pulmonary artery systolic  pressure.  3. Left atrial size was severely dilated.  4. Normal appearing mechanical bileaflet mitral valve replacement with no  PVL . The mitral valve has been repaired/replaced. No evidence of mitral  valve regurgitation.  5. AS and mean gradient stable since echo done 07/03/18. The aortic valve  has an indeterminant number of cusps. Aortic valve regurgitation is mild.  Mild aortic valve stenosis.   Patient Profile     69 y.o. 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   Assessment & Plan    Unstable Angina - s/p LHV 06/23/20 with stent intervention 06/24/20 to severe, calcified distal LMCA extending into the ostial and proximal LCx with successful PCI using orbital atherectomy and DES placement in the distal LMCA into the proximal LCx  - continue DAPT with plavix. No Aspirin with coumadin - No further chest pain  Acute on chronic CKD - baseline creatinine 1.4-1.5 however has been elevated during admission. 3.83>4.75.  - Suspected contrast induced nephrotoxicity from recent cath - nephrotoxic meds held - Nephrology consulted and recommend supportive  care - Might benefit from IVF ? Will defer to nephrology  Chronic systolic and diastolic HF s/p ICD placement 02/19/20 - Echo 12/23/19 with LVEF 25-30% and global hypokinesis, mild LVH, dilated LA, normal MVR with no evidence of MR - Normal functioning device 05/20/20 - Euvolemic on exam  DM2 - A1C 7.0 - SSI during admission  Mechanical MV - on coumadin therapy, PCP manages as OP  HLD - continue statin therapy  Persistent afib - rate controlled on coumadin  For questions or updates, please contact [facility] Please consult [email or web address] for contact info under        Signed, Cadence Ninfa Meeker, PA-C  06/28/2020, 7:53 AM    I have seen and examined the patient along  with Cadence Ninfa Meeker, PA-C .  I have reviewed the chart, notes and new data.  I agree with PA/NP's note.  Key new complaints: no angina, dyspnea or edema Key examination changes: no overt signs of HF. His outpatient weight is usually 218 lb per his report (he weighs pretty much the same today, was as much as 10 lb lighter last week). Key new findings / data: creat continues to rise by 1 mg/dl/day, oliguric.  PLAN: Hopefully will see creatinine plateau and begin improving before he develops signs of CHF or electrolyte abnormalities. Temporary HD will definitely be more complicated due to his mechanical prosthetic valve and need for full anticoagulation. INR high today. Hold warfarin and check INR daily. Start heparin IV if INR <2.5, which we can stop if he needs placement of a HD catheter.  Sanda Klein, MD, [place] [phone] 06/28/2020, 9:03 AM

## 2020-06-28 NOTE — Progress Notes (Signed)
John [name] for heparin (if INR < 2.5) Indication: mechanical mitral valve/afib  Allergies  Allergen Reactions  . Doxycycline Hives    Patient Measurements: Height: 5\' 11"  (180.3 cm) Weight: 98.2 kg (216 lb 6.4 oz) IBW/kg (Calculated) : 75.3 HEPARIN DW (KG): 94.1  Vital Signs: Temp: 97.7 F (36.5 C) (10/19 0834) Temp Source: Oral (10/19 0834) BP: 104/51 (10/19 0834) Pulse Rate: 73 (10/19 0834)  Labs: Recent Labs    06/25/20 1451 06/25/20 2057 06/26/20 0226 06/27/20 0250 06/28/20 0559 06/28/20 1255  HGB  --   --  11.1* 10.3*  --   --   HCT  --   --  34.6* 31.3*  --   --   PLT  --   --  138* 154  --   --   LABPROT  --   --  26.0* 30.5* 34.6*  --   INR  --   --  2.5* 3.0* 3.6*  --   HEPARINUNFRC 0.41 0.41 0.43  --   --   --   CREATININE  --   --  2.80* 3.83* 4.75*  --   CKTOTAL  --   --   --   --   --  68    Estimated Creatinine Clearance: 18 mL/min (A) (by C-G formula based on SCr of 4.75 mg/dL (H)).   Assessment: Patient with chest pain concerning for unstable angina. Chronically anticoagulated with warfarin (5 mg daily) for mechanical mitral valve and afib. Pt s/p cath on 10/15. Warfarin on hold for now in case he needs an HD catheter placed. Pharmacy to start heparin if INR < 2.5 -INR= 3.6 (trend up  Goal of Therapy:  Heparin level 0.3-0.7 units/ml Monitor platelets by anticoagulation protocol: Yes  Goal INR is 2.5-3.5    Plan:  -Warfarin on hold -Begin heparin if INR < 2.5 -Daily INR  Hildred Laser, PharmD Clinical Pharmacist **Pharmacist phone directory can now be found on [email or web address] ([email or web address]).  Listed under [place].

## 2020-06-28 NOTE — Progress Notes (Signed)
[id number] 70 afib, 98/57 and sats at 97%RA. Came to see pt to walk but sugars low earlier and asked to hold ambulation at this time by RN. Gave pt low sodium diets and encouraged 2000 mg restriction and encouraged daily weights. Discussed calling MD if he gains more than 3 lbs in day or 5 in week. Will continue to follow. Pt awaiting [place]. Graylon Good RN BSN 06/28/2020 8:20 AM

## 2020-06-28 NOTE — Progress Notes (Signed)
Inpatient Diabetes Program Recommendations  AACE/ADA: New Consensus Statement on Inpatient Glycemic Control (2015)  Target Ranges:  Prepandial:   less than 140 mg/dL      Peak postprandial:   less than 180 mg/dL (1-2 hours)      Critically ill patients:  140 - 180 mg/dL   Lab Results  Component Value Date   GLUCAP 146 (H) 06/28/2020   HGBA1C 7.0 (H) 06/20/2020    Review of Glycemic Control Results for John [name], John [name] (MRN [id number]) as of 06/28/2020 09:44  Ref. Range 06/27/2020 07:37 06/27/2020 11:56 06/27/2020 16:07 06/27/2020 21:47 06/28/2020 07:53 06/28/2020 08:31  Glucose-Capillary Latest Ref Range: 70 - 99 mg/dL 68 (L) 150 (H) 111 (H) 150 (H) 65 (L) 146 (H)   CP Diabetes history: DM 2 Outpatient Diabetes medications: Glipizide 10 mg Daily, Lantus 85 units bid, Metformin 500 mg bid, Januvia 50 mg Daily Current orders for Inpatient glycemic control:  Lantus 85 units qhs, Glipizide 10 mg Daily, Novolog 0-15 units tid + hs  A1c 7% on 10/11  Inpatient Diabetes Program Recommendations:    Mild hypoglycemia 68 on 10/18, 65 this am.  -  Reduce Lantus to 75-80 units.  Thanks,  Tama Headings RN, MSN, BC-ADM Inpatient Diabetes Coordinator Team Pager [phone] (8a-5p)

## 2020-06-29 ENCOUNTER — Encounter (HOSPITAL_COMMUNITY): Payer: PPO

## 2020-06-29 DIAGNOSIS — I10 Essential (primary) hypertension: Secondary | ICD-10-CM | POA: Diagnosis not present

## 2020-06-29 DIAGNOSIS — I4819 Other persistent atrial fibrillation: Secondary | ICD-10-CM | POA: Diagnosis not present

## 2020-06-29 DIAGNOSIS — Z95810 Presence of automatic (implantable) cardiac defibrillator: Secondary | ICD-10-CM | POA: Diagnosis not present

## 2020-06-29 DIAGNOSIS — R079 Chest pain, unspecified: Secondary | ICD-10-CM | POA: Diagnosis not present

## 2020-06-29 LAB — GLUCOSE, CAPILLARY
Glucose-Capillary: 72 mg/dL (ref 70–99)
Glucose-Capillary: 158 mg/dL — ABNORMAL HIGH (ref 70–99)
Glucose-Capillary: 159 mg/dL — ABNORMAL HIGH (ref 70–99)
Glucose-Capillary: 151 mg/dL — ABNORMAL HIGH (ref 70–99)

## 2020-06-29 LAB — CBC
HCT: 32.5 % — ABNORMAL LOW (ref 39.0–52.0)
Hemoglobin: 11 g/dL — ABNORMAL LOW (ref 13.0–17.0)
MCH: 29.6 pg (ref 26.0–34.0)
MCHC: 33.8 g/dL (ref 30.0–36.0)
MCV: 87.6 fL (ref 80.0–100.0)
Platelets: 183 10*3/uL (ref 150–400)
RBC: 3.71 MIL/uL — ABNORMAL LOW (ref 4.22–5.81)
RDW: 14.7 % (ref 11.5–15.5)
WBC: 5.2 10*3/uL (ref 4.0–10.5)
nRBC: 0 % (ref 0.0–0.2)

## 2020-06-29 LAB — BASIC METABOLIC PANEL
Anion gap: 12 (ref 5–15)
BUN: 89 mg/dL — ABNORMAL HIGH (ref 8–23)
CO2: 18 mmol/L — ABNORMAL LOW (ref 22–32)
Calcium: 8.2 mg/dL — ABNORMAL LOW (ref 8.9–10.3)
Chloride: 101 mmol/L (ref 98–111)
Creatinine, Ser: 4.29 mg/dL — ABNORMAL HIGH (ref 0.61–1.24)
GFR, Estimated: 13 mL/min — ABNORMAL LOW (ref >60)
Glucose, Bld: 136 mg/dL — ABNORMAL HIGH (ref 70–99)
Potassium: 4.2 mmol/L (ref 3.5–5.1)
Sodium: 131 mmol/L — ABNORMAL LOW (ref 135–145)

## 2020-06-29 LAB — PROTIME-INR
INR: 4 — ABNORMAL HIGH (ref 0.8–1.2)
Prothrombin Time: 37.5 seconds — ABNORMAL HIGH (ref 11.4–15.2)

## 2020-06-29 MED ORDER — SODIUM BICARBONATE 650 MG PO TABS
1300.0000 mg | ORAL_TABLET | Freq: Two times a day (BID) | ORAL | Status: DC
  Administered 2020-06-29 – 2020-06-30 (×4): 1300 mg via ORAL
  Filled 2020-06-29 (×4): qty 2

## 2020-06-29 NOTE — Progress Notes (Signed)
[facility] Progress Note    Assessment/ Plan:   1.  Unstable angina, history of coronary disease status post CABG.  Status post LHC 10/14 and PCI 10/15.  100 cc of contrast used for each procedure.  Now chest pain-free.  Management per cardiology 2.  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  AKI likely related to contrast-induced kidney injury whilst receiving Entresto. -supportive care -Renal ultrasound showed slightly larger kidney (likely diabetic kidney disease), ck ok, ua bland (not suggestive of a GN like picture) -Repeat UA -Continue to hold Entresto -No indication for renal replacement therapy -Kidney function overall stable today (creatinine likely peaking) 3.  Chronic systolic and diastolic heart failure, EF 25 to 30%.  Remains euvolemic 4.  Anemia, management per primary service.  Transfuse as needed 5.  History of hypertension now relatively hypotensive.  Antihypertensives on hold 6.  Diabetes mellitus type 2.  Management per primary service 7.  Metabolic acidosis, likely related to AKI.  Will start sodium bicarb 1300 mg twice daily  Subjective:   No complaints.  Creatinine stable at 4.3.  Denies any new symptoms   Objective:   BP 97/63 (BP Location: Left Arm)   Pulse 72   Temp 98 F (36.7 C) (Oral)   Resp 18   Ht 5\' 11"  (1.803 m)   Wt 98.2 kg   SpO2 97%   BMI 30.18 kg/m   Intake/Output Summary (Last 24 hours) at 06/29/2020 1309 Last data filed at 06/28/2020 2150 Gross per 24 hour  Intake 360 ml  Output 325 ml  Net 35 ml   Weight change: 0 kg  Physical Exam: Gen: No acute distress CVS: S1-S2 regular rate and rhythm Resp: Clear to auscultation bilaterally, no W/R/R/C, unlabored, bilateral chest expansion Abd: Soft, nontender, nondistended Ext: No edema Skin: No rash Neuro: Speech clear and coherent, moves all extremities spontaneously, no  asterixis  Imaging: US RENAL  Result Date: 06/28/2020 CLINICAL DATA:  Acute kidney injury. EXAM: RENAL / URINARY TRACT ULTRASOUND COMPLETE COMPARISON:  CT 08/17/2014 FINDINGS: Right Kidney: Renal measurements: 13.3 x 7.5 x 6.6 cm = volume: 313 mL. Echogenicity within normal limits. No mass or hydronephrosis visualized. Size unchanged since CT scan of 2015. Left Kidney: Renal measurements: 12.5 x 6.0 x 5.5 cm = volume: 217 mL. Echogenicity within normal limits. No mass or hydronephrosis visualized. Size unchanged since CT scan of 2015. Bladder: Appears normal for degree of bladder distention. Other: None. IMPRESSION: Slightly large kidneys as might be seen with diabetes. Size unchanged since CT scan of 2015. No evidence of hydronephrosis or focal renal parenchymal lesion. Electronically Signed   By: Nelson Chimes M.D.   On: 06/28/2020 15:07    Labs: BMET Recent Labs  Lab 06/23/20 0135 06/23/20 1602 06/23/20 1611 06/24/20 0206 06/25/20 7681 06/26/20 0226 06/27/20 0250 06/28/20 0559 06/29/20 0845  NA 140   < > 142 139 137 135 135 132* 131*  K 4.2   < > 4.2 4.2 3.9 3.8 3.9 4.4 4.2  CL 107  --   --  105 104 103 103 99 101  CO2 25  --   --  23 21* 19* 21* 22 18*  GLUCOSE 141*  --   --  209* 206* 245* 139* 90 136*  BUN 49*  --   --  45* 42* 54* 64* 79* 89*  CREATININE 1.54*  --   --  1.61* 1.68* 2.80* 3.83*  4.75* 4.29*  CALCIUM 8.7*  --   --  8.7* 8.4* 8.2* 7.9* 8.0* 8.2*   < > = values in this interval not displayed.   CBC Recent Labs  Lab 06/25/20 0632 06/26/20 0226 06/27/20 0250 06/29/20 0845  WBC 6.9 5.6 5.7 5.2  HGB 11.5* 11.1* 10.3* 11.0*  HCT 36.0* 34.6* 31.3* 32.5*  MCV 89.3 89.2 89.2 87.6  PLT 136* 138* 154 183    Medications:    . atorvastatin  80 mg Oral q1800  . clopidogrel  75 mg Oral Q breakfast  . glipiZIDE  10 mg Oral Daily  . insulin aspart  0-15 Units Subcutaneous TID WC  . insulin aspart  0-5 Units Subcutaneous QHS  . insulin glargine  75 Units  Subcutaneous QHS  . isosorbide mononitrate  30 mg Oral Daily  . metoprolol  200 mg Oral QPC breakfast  . pantoprazole  40 mg Oral Daily  . sodium chloride flush  3 mL Intravenous Q12H      Gean Quint, MD [facility] 06/29/2020, 1:09 PM

## 2020-06-29 NOTE — Plan of Care (Signed)

## 2020-06-29 NOTE — Progress Notes (Signed)
Reese for coumadin Indication: mechanical mitral valve/afib  Allergies  Allergen Reactions  . Doxycycline Hives    Patient Measurements: Height: 5\' 11"  (180.3 cm) Weight: 98.2 kg (216 lb 6.4 oz) IBW/kg (Calculated) : 75.3 HEPARIN DW (KG): 94.1  Vital Signs: Temp: 98 F (36.7 C) (10/20 1253) Temp Source: Oral (10/20 1253) BP: 97/63 (10/20 1253) Pulse Rate: 72 (10/20 1253)  Labs: Recent Labs    06/27/20 0250 06/28/20 0559 06/28/20 1255 06/29/20 0247 06/29/20 0845  HGB 10.3*  --   --   --  11.0*  HCT 31.3*  --   --   --  32.5*  PLT 154  --   --   --  183  LABPROT 30.5* 34.6*  --  37.5*  --   INR 3.0* 3.6*  --  4.0*  --   CREATININE 3.83* 4.75*  --   --  4.29*  CKTOTAL  --   --  68  --   --     Estimated Creatinine Clearance: 20 mL/min (A) (by C-G formula based on SCr of 4.29 mg/dL (H)).   Assessment: Patient with chest pain concerning for unstable angina. Chronically anticoagulated with warfarin (5 mg daily) for mechanical mitral valve and afib. Pt s/p cath on 10/15. Pharmacy dosing warfarin (no procedures planned) -INR= 4 (trend up)  Goal of Therapy:  Heparin level 0.3-0.7 units/ml Monitor platelets by anticoagulation protocol: Yes  Goal INR is 2.5-3.5    Plan:  -Hold warfarin today -Daily INR  Hildred Laser, PharmD Clinical Pharmacist **Pharmacist phone directory can now be found on [email or web address] ([email or web address]).  Listed under [place].

## 2020-06-29 NOTE — Progress Notes (Signed)
CARDIAC REHAB PHASE I   PRE:  Rate/Rhythm: 91 afib  BP:  Supine:   Sitting: 127/64  Standing:    SaO2: 97%RA  MODE:  Ambulation: 750 ft   POST:  Rate/Rhythm: 102 afib  BP:  Supine:   Sitting: 127/60  Standing:    SaO2: 99%RA 0856-0916 Pt walked 750 ft with steady gait and no CP. Gave pt CHF booklet due to low EF and encouraged daily weights and to watch sodium. Reviewed yellow zone of when to call MD with weight or symptoms.  Encouraged to begin walking and increasing slowly as tolerated.    Graylon Good, RN BSN  06/29/2020 9:31 AM

## 2020-06-29 NOTE — Progress Notes (Addendum)
Progress Note  Patient Name: John [name] Date of Encounter: 06/29/2020  [facility] Cardiologist: Minus Breeding, MD   Subjective   BMET pending. INR 4.0 today. No chest pain or SOB.   Inpatient Medications    Scheduled Meds: . atorvastatin  80 mg Oral q1800  . clopidogrel  75 mg Oral Q breakfast  . glipiZIDE  10 mg Oral Daily  . insulin aspart  0-15 Units Subcutaneous TID WC  . insulin aspart  0-5 Units Subcutaneous QHS  . insulin glargine  75 Units Subcutaneous QHS  . isosorbide mononitrate  30 mg Oral Daily  . metoprolol  200 mg Oral QPC breakfast  . pantoprazole  40 mg Oral Daily  . sodium chloride flush  3 mL Intravenous Q12H   Continuous Infusions: . sodium chloride     PRN Meds: sodium chloride, acetaminophen, alum & mag hydroxide-simeth, ondansetron (ZOFRAN) IV, sodium chloride flush, zolpidem   Vital Signs    Vitals:   06/28/20 0337 06/28/20 0834 06/28/20 1622 06/29/20 0549  BP: (!) 99/57 (!) 104/51 (!) 110/56 101/65  Pulse: 69 73 75 97  Resp: 16 16 16 18   Temp: 97.9 F (36.6 C) 97.7 F (36.5 C) 97.7 F (36.5 C) 97.8 F (36.6 C)  TempSrc: Oral Oral Oral Oral  SpO2: 96% 96% 96% 91%  Weight: 98.2 kg   98.2 kg  Height:        Intake/Output Summary (Last 24 hours) at 06/29/2020 0817 Last data filed at 06/28/2020 2150 Gross per 24 hour  Intake 360 ml  Output 325 ml  Net 35 ml   Last 3 Weights 06/29/2020 06/28/2020 06/27/2020  Weight (lbs) 216 lb 6.4 oz 216 lb 6.4 oz 213 lb  Weight (kg) 98.158 kg 98.158 kg 96.616 kg      Telemetry    Afib, HR60-70, brief V-pcaing - Personally Reviewed  ECG    No new - Personally Reviewed  Physical Exam   GEN: No acute distress.   Neck: No JVD Cardiac: Irreg Irreg, no murmurs, rubs, or gallops.  Respiratory: Clear to auscultation bilaterally. GI: Soft, nontender, non-distended  MS: No edema; No deformity. Neuro:  Nonfocal  Psych: Normal affect   Labs    High Sensitivity Troponin:     Recent Labs  Lab 06/20/20 1207 06/20/20 1448  TROPONINIHS 14 16      Chemistry Recent Labs  Lab 06/26/20 0226 06/27/20 0250 06/28/20 0559  NA 135 135 132*  K 3.8 3.9 4.4  CL 103 103 99  CO2 19* 21* 22  GLUCOSE 245* 139* 90  BUN 54* 64* 79*  CREATININE 2.80* 3.83* 4.75*  CALCIUM 8.2* 7.9* 8.0*  GFRNONAA 22* 15* 12*  ANIONGAP 13 11 11      Hematology Recent Labs  Lab 06/25/20 0632 06/26/20 0226 06/27/20 0250  WBC 6.9 5.6 5.7  RBC 4.03* 3.88* 3.51*  HGB 11.5* 11.1* 10.3*  HCT 36.0* 34.6* 31.3*  MCV 89.3 89.2 89.2  MCH 28.5 28.6 29.3  MCHC 31.9 32.1 32.9  RDW 14.8 14.7 14.9  PLT 136* 138* 154    BNPNo results for input(s): BNP, PROBNP in the last 168 hours.   DDimer No results for input(s): DDIMER in the last 168 hours.   Radiology    US RENAL  Result Date: 06/28/2020 CLINICAL DATA:  Acute kidney injury. EXAM: RENAL / URINARY TRACT ULTRASOUND COMPLETE COMPARISON:  CT 08/17/2014 FINDINGS: Right Kidney: Renal measurements: 13.3 x 7.5 x 6.6 cm = volume: 313 mL. Echogenicity within  normal limits. No mass or hydronephrosis visualized. Size unchanged since CT scan of 2015. Left Kidney: Renal measurements: 12.5 x 6.0 x 5.5 cm = volume: 217 mL. Echogenicity within normal limits. No mass or hydronephrosis visualized. Size unchanged since CT scan of 2015. Bladder: Appears normal for degree of bladder distention. Other: None. IMPRESSION: Slightly large kidneys as might be seen with diabetes. Size unchanged since CT scan of 2015. No evidence of hydronephrosis or focal renal parenchymal lesion. Electronically Signed   By: Nelson Chimes M.D.   On: 06/28/2020 15:07    Cardiac Studies    LHC 06/23/20:  Ost Cx lesion is 85% stenosed.  Mid LAD lesion is 100% stenosed.  Prox RCA lesion is 30% stenosed.  Prox RCA to Dist RCA lesion is 80% stenosed.  Origin lesion is 100% stenosed.  Prox LAD to Mid LAD lesion is 70% stenosed.  Severe native CAD with diffuse 7075%  proximal LAD stenoses prior to total occlusion of the mid LAD; 80 to 90% ostial circumflex stenosis proximal to a high marginal/ramus intermediate length vessel; 30% proximal RCA stenosis with diffuse 80% mid to distal stenoses. There is collateralization to the PLA vessel from the left coronary circulation.  Patent LIMA to mid LAD.  Old occlusion of the vein graft which previously supplied the distal RCA.  Mild right heart pressure elevation. Mean PA pressure 22 mm.  Very mild aortic stenosis with a peak to peak gradient of 10 and mean gradient of 7.5 mmHg; AVA 2.4 cm   Diagnostic Dominance: Right   Echo 12/23/19: 1. EF remains severely depressed to slightly worse than echo done October  2019 . Left ventricular ejection fraction, by estimation, is 25 to 30%.  The left ventricle has severely decreased function. The left ventricle  demonstrates global hypokinesis. The  left ventricular internal cavity size was severely dilated. There is mild  left ventricular hypertrophy. Left ventricular diastolic parameters are  indeterminate.  2. Right ventricular systolic function is normal. The right ventricular  size is normal. There is moderately elevated pulmonary artery systolic  pressure.  3. Left atrial size was severely dilated.  4. Normal appearing mechanical bileaflet mitral valve replacement with no  PVL . The mitral valve has been repaired/replaced. No evidence of mitral  valve regurgitation.  5. AS and mean gradient stable since echo done 07/03/18. The aortic valve  has an indeterminant number of cusps. Aortic valve regurgitation is mild.  Mild aortic valve stenosis.    Patient Profile     69 y.o. 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   Assessment & Plan    Unstable Angina - s/p LHV 06/23/20 with stent intervention 06/24/20 to severe, calcified distal LMCA extending into the ostial and proximal LCx with successful PCI using orbital atherectomy and DES placement in the distal LMCA into the proximal LCx  - continue DAPT with plavix. No Aspirin with coumadin - No further chest pain  Acute on chronic CKD - baseline creatinine 1.4-1.5 however has been elevated during admission. 3.83>4.75.  - Suspected contrast induced nephrotoxicity from recent cath - nephrotoxic meds held - Nephrology consulted and recommend supportive care. UA and CK unremarkable. Renal US ordered. No indication for renal replacement at this time. - BMET pending  Chronic systolic and diastolic HF s/p ICD placement 02/19/20 - Echo 12/23/19 with LVEF 25-30% and global hypokinesis, mild LVH, dilated LA, normal MVR with no evidence of MR - Normal functioning device 05/20/20 - Euvolemic on exam  DM2 - A1C 7.0 - SSI during admission - Diabetes coordinator consulted for hypoglycemia  Mechanical MV - on coumadin therapy, management per pharmacy - PCP manages as OP  HLD - continue statin therapy  Persistent afib - rate controlled on coumadin  For questions or updates, please contact [place] Please consult [email or web address] for contact info under        Signed, Cadence Ninfa Meeker, PA-C  06/29/2020, 8:17 AM    I have seen and examined the patient along with Cadence Ninfa Meeker, PA-C .  I have reviewed the chart, notes and new data.  I agree with PA/NP's note.  Key new complaints: he has a noticeable increase in UO this am. No angina and no dyspnea/edema Key examination changes: no JVD/rales/edema Key new findings / data: creatinine has plateau'd and showing small improvement. BUN has climbed further. INR 4.0.  PLAN: Watch for complications of post ATN  polyuria/electrolyte imbalances.  Sanda Klein, MD, [place] [phone] 06/29/2020, 9:45 AM

## 2020-06-30 ENCOUNTER — Telehealth: Payer: Self-pay | Admitting: Oncology

## 2020-06-30 DIAGNOSIS — I4819 Other persistent atrial fibrillation: Secondary | ICD-10-CM | POA: Diagnosis not present

## 2020-06-30 DIAGNOSIS — R079 Chest pain, unspecified: Secondary | ICD-10-CM | POA: Diagnosis not present

## 2020-06-30 DIAGNOSIS — I10 Essential (primary) hypertension: Secondary | ICD-10-CM | POA: Diagnosis not present

## 2020-06-30 DIAGNOSIS — Z95810 Presence of automatic (implantable) cardiac defibrillator: Secondary | ICD-10-CM | POA: Diagnosis not present

## 2020-06-30 LAB — CBC
HCT: 29.8 % — ABNORMAL LOW (ref 39.0–52.0)
Hemoglobin: 9.9 g/dL — ABNORMAL LOW (ref 13.0–17.0)
MCH: 29.1 pg (ref 26.0–34.0)
MCHC: 33.2 g/dL (ref 30.0–36.0)
MCV: 87.6 fL (ref 80.0–100.0)
Platelets: 185 10*3/uL (ref 150–400)
RBC: 3.4 MIL/uL — ABNORMAL LOW (ref 4.22–5.81)
RDW: 15 % (ref 11.5–15.5)
WBC: 4.4 10*3/uL (ref 4.0–10.5)
nRBC: 0 % (ref 0.0–0.2)

## 2020-06-30 LAB — BASIC METABOLIC PANEL
Anion gap: 9 (ref 5–15)
BUN: 90 mg/dL — ABNORMAL HIGH (ref 8–23)
CO2: 22 mmol/L (ref 22–32)
Calcium: 8.2 mg/dL — ABNORMAL LOW (ref 8.9–10.3)
Chloride: 105 mmol/L (ref 98–111)
Creatinine, Ser: 3.8 mg/dL — ABNORMAL HIGH (ref 0.61–1.24)
GFR, Estimated: 15 mL/min — ABNORMAL LOW (ref >60)
Glucose, Bld: 99 mg/dL (ref 70–99)
Potassium: 4.9 mmol/L (ref 3.5–5.1)
Sodium: 136 mmol/L (ref 135–145)

## 2020-06-30 LAB — GLUCOSE, CAPILLARY
Glucose-Capillary: 87 mg/dL (ref 70–99)
Glucose-Capillary: 165 mg/dL — ABNORMAL HIGH (ref 70–99)
Glucose-Capillary: 191 mg/dL — ABNORMAL HIGH (ref 70–99)
Glucose-Capillary: 207 mg/dL — ABNORMAL HIGH (ref 70–99)

## 2020-06-30 LAB — PROTIME-INR
INR: 3 — ABNORMAL HIGH (ref 0.8–1.2)
Prothrombin Time: 30.5 seconds — ABNORMAL HIGH (ref 11.4–15.2)

## 2020-06-30 MED ORDER — WARFARIN SODIUM 5 MG PO TABS
5.0000 mg | ORAL_TABLET | Freq: Once | ORAL | Status: AC
  Administered 2020-06-30: 5 mg via ORAL
  Filled 2020-06-30: qty 1

## 2020-06-30 MED ORDER — WARFARIN - PHARMACIST DOSING INPATIENT
Freq: Every day | Status: DC
  Administered 2020-06-30: 1

## 2020-06-30 MED ORDER — ROSUVASTATIN CALCIUM 20 MG PO TABS
20.0000 mg | ORAL_TABLET | Freq: Every day | ORAL | Status: DC
  Administered 2020-07-01: 20 mg via ORAL
  Filled 2020-06-30: qty 1

## 2020-06-30 MED ORDER — INSULIN GLARGINE 100 UNIT/ML ~~LOC~~ SOLN
70.0000 [IU] | Freq: Every day | SUBCUTANEOUS | Status: DC
  Administered 2020-06-30: 70 [IU] via SUBCUTANEOUS
  Filled 2020-06-30 (×2): qty 0.7

## 2020-06-30 MED ORDER — ROSUVASTATIN CALCIUM 20 MG PO TABS
40.0000 mg | ORAL_TABLET | Freq: Every day | ORAL | Status: DC
  Administered 2020-06-30: 40 mg via ORAL
  Filled 2020-06-30: qty 2

## 2020-06-30 NOTE — Progress Notes (Signed)
[place] for coumadin Indication: mechanical mitral valve/afib  Allergies  Allergen Reactions  . Doxycycline Hives    Patient Measurements: Height: 5\' 11"  (180.3 cm) Weight: 97 kg (213 lb 14.4 oz) IBW/kg (Calculated) : 75.3 HEPARIN DW (KG): 94.1  Vital Signs: Temp: 97.7 F (36.5 C) (10/21 0611) Temp Source: Oral (10/21 0611) BP: 107/60 (10/21 0611) Pulse Rate: 65 (10/21 0611)  Labs: Recent Labs    06/28/20 0559 06/28/20 1255 06/29/20 0247 06/29/20 0845 06/30/20 0128  HGB  --   --   --  11.0* 9.9*  HCT  --   --   --  32.5* 29.8*  PLT  --   --   --  183 185  LABPROT 34.6*  --  37.5*  --  30.5*  INR 3.6*  --  4.0*  --  3.0*  CREATININE 4.75*  --   --  4.29* 3.80*  CKTOTAL  --  68  --   --   --     Estimated Creatinine Clearance: 22.4 mL/min (A) (by C-G formula based on SCr of 3.8 mg/dL (H)).   Assessment: Patient with chest pain concerning for unstable angina. Chronically anticoagulated with warfarin (5 mg daily) for mechanical mitral valve and afib. Pt s/p cath on 10/15. Pharmacy dosing warfarin (no procedures planned).  -INR= 4> 3.0   Goal of Therapy:  Heparin level 0.3-0.7 units/ml Monitor platelets by anticoagulation protocol: Yes  Goal INR is 2.5-3.5    Plan:  -Warfarin 5mg  po today -Daily INR  Hildred Laser, PharmD Clinical Pharmacist **Pharmacist phone directory can now be found on [email or web address] ([email or web address]).  Listed under [place].

## 2020-06-30 NOTE — Progress Notes (Addendum)
Progress Note  Patient Name: John [name] Date of Encounter: 06/30/2020  [facility] Cardiologist: Minus Breeding, MD   Subjective   Creatinine trending down. No chest pain or sob. Patient is requesting to switch statins, he has a history of joint pain on Lipitor. INR 3.0 today  Inpatient Medications    Scheduled Meds: . atorvastatin  80 mg Oral q1800  . clopidogrel  75 mg Oral Q breakfast  . glipiZIDE  10 mg Oral Daily  . insulin aspart  0-15 Units Subcutaneous TID WC  . insulin aspart  0-5 Units Subcutaneous QHS  . insulin glargine  75 Units Subcutaneous QHS  . isosorbide mononitrate  30 mg Oral Daily  . metoprolol  200 mg Oral QPC breakfast  . pantoprazole  40 mg Oral Daily  . sodium bicarbonate  1,300 mg Oral BID  . sodium chloride flush  3 mL Intravenous Q12H   Continuous Infusions: . sodium chloride     PRN Meds: sodium chloride, acetaminophen, alum & mag hydroxide-simeth, ondansetron (ZOFRAN) IV, sodium chloride flush, zolpidem   Vital Signs    Vitals:   06/29/20 0549 06/29/20 1253 06/29/20 2025 06/30/20 0611  BP: 101/65 97/63 (!) 100/52 107/60  Pulse: 97 72 70 65  Resp: 18 18 19 19   Temp: 97.8 F (36.6 C) 98 F (36.7 C) 98.3 F (36.8 C) 97.7 F (36.5 C)  TempSrc: Oral Oral Oral Oral  SpO2: 91% 97% 96% 96%  Weight: 98.2 kg   97 kg  Height:        Intake/Output Summary (Last 24 hours) at 06/30/2020 0757 Last data filed at 06/30/2020 0615 Gross per 24 hour  Intake --  Output 2800 ml  Net -2800 ml   Last 3 Weights 06/30/2020 06/29/2020 06/28/2020  Weight (lbs) 213 lb 14.4 oz 216 lb 6.4 oz 216 lb 6.4 oz  Weight (kg) 97.024 kg 98.158 kg 98.158 kg      Telemetry    Afib HR 70s, PVCs - Personally Reviewed  ECG    No new - Personally Reviewed  Physical Exam   GEN: No acute distress.   Neck: No JVD Cardiac: RRR, no murmurs, rubs, or gallops.  Respiratory: Clear to auscultation bilaterally. GI: Soft, nontender, non-distended  MS:  minimal edema; No deformity. Neuro:  Nonfocal  Psych: Normal affect   Labs    High Sensitivity Troponin:   Recent Labs  Lab 06/20/20 1207 06/20/20 1448  TROPONINIHS 14 16      Chemistry Recent Labs  Lab 06/28/20 0559 06/29/20 0845 06/30/20 0128  NA 132* 131* 136  K 4.4 4.2 4.9  CL 99 101 105  CO2 22 18* 22  GLUCOSE 90 136* 99  BUN 79* 89* 90*  CREATININE 4.75* 4.29* 3.80*  CALCIUM 8.0* 8.2* 8.2*  GFRNONAA 12* 13* 15*  ANIONGAP 11 12 9      Hematology Recent Labs  Lab 06/27/20 0250 06/29/20 0845 06/30/20 0128  WBC 5.7 5.2 4.4  RBC 3.51* 3.71* 3.40*  HGB 10.3* 11.0* 9.9*  HCT 31.3* 32.5* 29.8*  MCV 89.2 87.6 87.6  MCH 29.3 29.6 29.1  MCHC 32.9 33.8 33.2  RDW 14.9 14.7 15.0  PLT 154 183 185    BNPNo results for input(s): BNP, PROBNP in the last 168 hours.   DDimer No results for input(s): DDIMER in the last 168 hours.   Radiology    US RENAL  Result Date: 06/28/2020 CLINICAL DATA:  Acute kidney injury. EXAM: RENAL / URINARY TRACT ULTRASOUND COMPLETE  COMPARISON:  CT 08/17/2014 FINDINGS: Right Kidney: Renal measurements: 13.3 x 7.5 x 6.6 cm = volume: 313 mL. Echogenicity within normal limits. No mass or hydronephrosis visualized. Size unchanged since CT scan of 2015. Left Kidney: Renal measurements: 12.5 x 6.0 x 5.5 cm = volume: 217 mL. Echogenicity within normal limits. No mass or hydronephrosis visualized. Size unchanged since CT scan of 2015. Bladder: Appears normal for degree of bladder distention. Other: None. IMPRESSION: Slightly large kidneys as might be seen with diabetes. Size unchanged since CT scan of 2015. No evidence of hydronephrosis or focal renal parenchymal lesion. Electronically Signed   By: Nelson Chimes M.D.   On: 06/28/2020 15:07    Cardiac Studies    LHC 06/23/20:  Ost Cx lesion is 85% stenosed.  Mid LAD lesion is 100% stenosed.  Prox RCA lesion is 30% stenosed.  Prox RCA to Dist RCA lesion is 80% stenosed.  Origin lesion is  100% stenosed.  Prox LAD to Mid LAD lesion is 70% stenosed.  Severe native CAD with diffuse 7075% proximal LAD stenoses prior to total occlusion of the mid LAD; 80 to 90% ostial circumflex stenosis proximal to a high marginal/ramus intermediate length vessel; 30% proximal RCA stenosis with diffuse 80% mid to distal stenoses. There is collateralization to the PLA vessel from the left coronary circulation.  Patent LIMA to mid LAD.  Old occlusion of the vein graft which previously supplied the distal RCA.  Mild right heart pressure elevation. Mean PA pressure 22 mm.  Very mild aortic stenosis with a peak to peak gradient of 10 and mean gradient of 7.5 mmHg; AVA 2.4 cm   Diagnostic Dominance: Right   Echo 12/23/19: 1. EF remains severely depressed to slightly worse than echo done October  2019 . Left ventricular ejection fraction, by estimation, is 25 to 30%.  The left ventricle has severely decreased function. The left ventricle  demonstrates global hypokinesis. The  left ventricular internal cavity size was severely dilated. There is mild  left ventricular hypertrophy. Left ventricular diastolic parameters are  indeterminate.  2. Right ventricular systolic function is normal. The right ventricular  size is normal. There is moderately elevated pulmonary artery systolic  pressure.  3. Left atrial size was severely dilated.  4. Normal appearing mechanical bileaflet mitral valve replacement with no  PVL . The mitral valve has been repaired/replaced. No evidence of mitral  valve regurgitation.  5. AS and mean gradient stable since echo done 07/03/18. The aortic valve  has an indeterminant number of cusps. Aortic valve regurgitation is mild.  Mild aortic valve stenosis.    Patient Profile     69 y.o. male with pmhCAD status post CABG (patent LIMA, occluded SVG to RCA) chronic systolic and diastolic heart failure (LVEF 25 to 30%), status post mechanical mitral valve  replacement secondary to rheumatic heart disease, mild aortic stenosis, status post ICD, hypertension, persistent atrial fibrillation, and hyperlipidemia admitted with unstable angina.   Assessment & Plan    Unstable Angina - s/p LHV 06/23/20 with stent intervention 06/24/20 to severe, calcified distal LMCA extending into the ostial and proximal LCx with successful PCI using orbital atherectomy and DES placement in the distal LMCA into the proximal LCx  - continue DAPT with plavix. No Aspirin with coumadin - No further chest pain  Acute on chronic CKD - baseline creatinine 1.4-1.5 however has been elevated during admission. 3.83>4.75.  -Suspected contrast induced nephrotoxicity from recent cath - nephrotoxic meds held - Nephrology consulted and recommend supportive  care. UA and CK unremarkable. Renal US ordered. No indication for renal replacement at this time. - Creatinine trending down, 9.35>7.01>7.79  Chronic systolic and diastolic HF s/p ICD placement 02/19/20 - Echo 12/23/19 with LVEF 25-30% and global hypokinesis, mild LVH, dilated LA, normal MVR with no evidence of MR - Normal functioning device 05/20/20 - minimal lower leg edema on exam  DM2 - A1C 7.0 - SSI during admission - Diabetes coordinator consulted for hypoglycemia  Mechanical MV - on coumadin therapy, management per pharmacy - PCP manages as OP  HLD - pt with history of joint/muscle pain on Lipitor, he is requesting another statin. He has been on pravastatin in the past.  - Will try crestor  Persistent afib - rate controlled on coumadin   For questions or updates, please contact [place] Please consult [email or web address] for contact info under        Signed, Cadence Ninfa Meeker, PA-C  06/30/2020, 7:57 AM    I have seen and examined the patient along with Cadence Ninfa Meeker, PA-C .  I have reviewed the chart, notes and new data.  I agree with PA/NP's note.  Key new complaints: feels great. No  angina. Key examination changes: no edema or JVD. Weight decreasing. Key new findings / data: improving renal parameters, so far without major electrolyte abnormalities  PLAN: Probable DC in AM. Will need TOC visit and repeat BMET in 1-2 weeks. Hold off Entresto and diuretics until follow up visit. Daily weight log, call if weight exceeds 215 lb. He tolerated pravastatin, but LDL was 80s-90s. Intolerant to atorvastatin. Hopefully will do OK w rosuvastatin, but will reduce the dose due to renal dysfunction.  Sanda Klein, MD, [place] [phone] 06/30/2020, 9:32 AM

## 2020-06-30 NOTE — Progress Notes (Signed)
CARDIAC REHAB PHASE I   PRE:  Rate/Rhythm: 78 afib  BP:  Supine:   Sitting: 116/64  Standing:    SaO2: 97%RA  MODE:  Ambulation: 1250 ft   POST:  Rate/Rhythm: 83 afib  BP:  Supine:   Sitting: 119/66  Standing:    SaO2: 98%RA [id number] Pt walked 1250 ft on RA with slow steady gait and no CP. Tolerated well.    Graylon Good, RN BSN  06/30/2020 9:11 AM

## 2020-06-30 NOTE — Telephone Encounter (Signed)
Cancelled appt for 10/27 per patient request in sch msg. Called and left msg about appt being cancelled and to call back to reschedule if needed

## 2020-06-30 NOTE — Progress Notes (Signed)
[facility] Progress Note    Assessment/ Plan:   1.  Unstable angina, history of coronary disease status post CABG.  Status post LHC 10/14 and PCI 10/15.  100 cc of contrast used for each procedure.  Now chest pain-free.  Management per cardiology 2.  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  AKI likely related to contrast-induced kidney injury whilst receiving Entresto. -kidney function improving, watch urine output closely, may start auto-diuresing -supportive care -Renal ultrasound showed slightly larger kidney (likely diabetic kidney disease), ck ok, ua bland (not suggestive of a GN like picture) -Continue to hold Entresto -No indication for renal replacement therapy -Kidney function overall stable today (creatinine likely peaking) 3.  Chronic systolic and diastolic heart failure, EF 25 to 30%.  Remains euvolemic 4.  Anemia, management per primary service.  Transfuse as needed 5.  History of hypertension now relatively hypotensive.  Antihypertensives on hold 6.  Diabetes mellitus type 2.  Management per primary service 7.  Metabolic acidosis, likely related to AKI.  Will start sodium bicarb 1300 mg twice daily  Subjective:   No complaints.  Feels well, feels like he is urinating more. Cr down to 3.8.   Objective:   BP 107/60 (BP Location: Left Arm)    Pulse 65    Temp 97.7 F (36.5 C) (Oral)    Resp 19    Ht 5\' 11"  (1.803 m)    Wt 97 kg    SpO2 96%    BMI 29.83 kg/m   Intake/Output Summary (Last 24 hours) at 06/30/2020 1057 Last data filed at 06/30/2020 0900 Gross per 24 hour  Intake 200 ml  Output 2800 ml  Net -2600 ml   Weight change: -1.134 kg  Physical Exam: Gen: No acute distress CVS: S1-S2 regular rate and rhythm Resp: Clear to auscultation bilaterally, no W/R/R/C, unlabored, bilateral chest expansion Abd: Soft, nontender, nondistended Ext: No edema Skin: No  rash Neuro: Speech clear and coherent, moves all extremities spontaneously, no asterixis  Imaging: US RENAL  Result Date: 06/28/2020 CLINICAL DATA:  Acute kidney injury. EXAM: RENAL / URINARY TRACT ULTRASOUND COMPLETE COMPARISON:  CT 08/17/2014 FINDINGS: Right Kidney: Renal measurements: 13.3 x 7.5 x 6.6 cm = volume: 313 mL. Echogenicity within normal limits. No mass or hydronephrosis visualized. Size unchanged since CT scan of 2015. Left Kidney: Renal measurements: 12.5 x 6.0 x 5.5 cm = volume: 217 mL. Echogenicity within normal limits. No mass or hydronephrosis visualized. Size unchanged since CT scan of 2015. Bladder: Appears normal for degree of bladder distention. Other: None. IMPRESSION: Slightly large kidneys as might be seen with diabetes. Size unchanged since CT scan of 2015. No evidence of hydronephrosis or focal renal parenchymal lesion. Electronically Signed   By: Nelson Chimes M.D.   On: 06/28/2020 15:07    Labs: BMET Recent Labs  Lab 06/24/20 0206 06/25/20 1610 06/26/20 0226 06/27/20 0250 06/28/20 0559 06/29/20 0845 06/30/20 0128  NA 139 137 135 135 132* 131* 136  K 4.2 3.9 3.8 3.9 4.4 4.2 4.9  CL 105 104 103 103 99 101 105  CO2 23 21* 19* 21* 22 18* 22  GLUCOSE 209* 206* 245* 139* 90 136* 99  BUN 45* 42* 54* 64* 79* 89* 90*  CREATININE 1.61* 1.68* 2.80* 3.83* 4.75* 4.29* 3.80*  CALCIUM 8.7* 8.4* 8.2* 7.9* 8.0* 8.2* 8.2*   CBC Recent Labs  Lab 06/26/20 0226 06/27/20 0250 06/29/20 0845 06/30/20 0128  WBC 5.6 5.7 5.2 4.4  HGB 11.1* 10.3* 11.0* 9.9*  HCT 34.6* 31.3* 32.5* 29.8*  MCV 89.2 89.2 87.6 87.6  PLT 138* 154 183 185    Medications:     clopidogrel  75 mg Oral Q breakfast   glipiZIDE  10 mg Oral Daily   insulin aspart  0-15 Units Subcutaneous TID WC   insulin aspart  0-5 Units Subcutaneous QHS   insulin glargine  70 Units Subcutaneous QHS   isosorbide mononitrate  30 mg Oral Daily   metoprolol  200 mg Oral QPC breakfast   pantoprazole  40  mg Oral Daily   [START ON 07/01/2020] rosuvastatin  20 mg Oral Daily   sodium bicarbonate  1,300 mg Oral BID   sodium chloride flush  3 mL Intravenous Q12H      Gean Quint, MD [facility] 06/30/2020, 10:57 AM

## 2020-06-30 NOTE — Plan of Care (Signed)

## 2020-06-30 NOTE — Progress Notes (Signed)
Inpatient Diabetes Program Recommendations  AACE/ADA: New Consensus Statement on Inpatient Glycemic Control (2015)  Target Ranges:  Prepandial:   less than 140 mg/dL      Peak postprandial:   less than 180 mg/dL (1-2 hours)      Critically ill patients:  140 - 180 mg/dL   Lab Results  Component Value Date   GLUCAP 87 06/30/2020   HGBA1C 7.0 (H) 06/20/2020    Review of Glycemic Control Results for John [name], John [name] (MRN [id number]) as of 06/30/2020 09:21  Ref. Range 06/29/2020 08:04 06/29/2020 11:20 06/29/2020 16:23 06/29/2020 20:56 06/30/2020 07:52  Glucose-Capillary Latest Ref Range: 70 - 99 mg/dL 72 158 (H) 159 (H) 151 (H) 87   Diabetes history: DM 2 Outpatient Diabetes medications: Glipizide 10 mg Daily, Lantus 85 units bid, Metformin 500 mg bid, Januvia 50 mg Daily Current orders for Inpatient glycemic control:  Lantus 75 units qhs, Glipizide 10 mg Daily, Novolog 0-15 units tid + hs  Inpatient Diabetes Program Recommendations:   Fasting CBG 87 -Decrease Lantus to 70 Secure chat to Dr. Angelena Form  Thank you, Nani Gasser. Lakyn Mantione, RN, MSN, CDE  Diabetes Coordinator Inpatient Glycemic Control Team Team Pager [phone] (8am-5pm) 06/30/2020 9:23 AM

## 2020-07-01 ENCOUNTER — Other Ambulatory Visit: Payer: Self-pay | Admitting: Medical

## 2020-07-01 ENCOUNTER — Telehealth: Payer: Self-pay | Admitting: Cardiology

## 2020-07-01 DIAGNOSIS — E162 Hypoglycemia, unspecified: Secondary | ICD-10-CM

## 2020-07-01 DIAGNOSIS — Z9889 Other specified postprocedural states: Secondary | ICD-10-CM

## 2020-07-01 DIAGNOSIS — T508X5A Adverse effect of diagnostic agents, initial encounter: Secondary | ICD-10-CM

## 2020-07-01 DIAGNOSIS — N141 Nephropathy induced by other drugs, medicaments and biological substances: Secondary | ICD-10-CM

## 2020-07-01 DIAGNOSIS — Z952 Presence of prosthetic heart valve: Secondary | ICD-10-CM

## 2020-07-01 DIAGNOSIS — N179 Acute kidney failure, unspecified: Secondary | ICD-10-CM

## 2020-07-01 DIAGNOSIS — E7800 Pure hypercholesterolemia, unspecified: Secondary | ICD-10-CM

## 2020-07-01 DIAGNOSIS — N1411 Contrast-induced nephropathy: Secondary | ICD-10-CM

## 2020-07-01 HISTORY — DX: Nephropathy induced by other drugs, medicaments and biological substances: N14.1

## 2020-07-01 HISTORY — DX: Adverse effect of diagnostic agents, initial encounter: T50.8X5A

## 2020-07-01 HISTORY — DX: Contrast-induced nephropathy: N14.11

## 2020-07-01 LAB — CBC
HCT: 31.5 % — ABNORMAL LOW (ref 39.0–52.0)
Hemoglobin: 10.1 g/dL — ABNORMAL LOW (ref 13.0–17.0)
MCH: 28.6 pg (ref 26.0–34.0)
MCHC: 32.1 g/dL (ref 30.0–36.0)
MCV: 89.2 fL (ref 80.0–100.0)
Platelets: 204 10*3/uL (ref 150–400)
RBC: 3.53 MIL/uL — ABNORMAL LOW (ref 4.22–5.81)
RDW: 15 % (ref 11.5–15.5)
WBC: 4.7 10*3/uL (ref 4.0–10.5)
nRBC: 0 % (ref 0.0–0.2)

## 2020-07-01 LAB — BASIC METABOLIC PANEL
Anion gap: 9 (ref 5–15)
BUN: 81 mg/dL — ABNORMAL HIGH (ref 8–23)
CO2: 22 mmol/L (ref 22–32)
Calcium: 8.6 mg/dL — ABNORMAL LOW (ref 8.9–10.3)
Chloride: 107 mmol/L (ref 98–111)
Creatinine, Ser: 2.44 mg/dL — ABNORMAL HIGH (ref 0.61–1.24)
GFR, Estimated: 28 mL/min — ABNORMAL LOW (ref >60)
Glucose, Bld: 156 mg/dL — ABNORMAL HIGH (ref 70–99)
Potassium: 4.7 mmol/L (ref 3.5–5.1)
Sodium: 138 mmol/L (ref 135–145)

## 2020-07-01 LAB — PROTIME-INR
INR: 2.7 — ABNORMAL HIGH (ref 0.8–1.2)
Prothrombin Time: 28.2 seconds — ABNORMAL HIGH (ref 11.4–15.2)

## 2020-07-01 LAB — GLUCOSE, CAPILLARY: Glucose-Capillary: 112 mg/dL — ABNORMAL HIGH (ref 70–99)

## 2020-07-01 MED ORDER — PANTOPRAZOLE SODIUM 40 MG PO TBEC: 40.0000 mg | DELAYED_RELEASE_TABLET | Freq: Every day | ORAL | 3 refills | Status: DC

## 2020-07-01 MED ORDER — ASPIRIN EC 81 MG PO TBEC: 81.0000 mg | DELAYED_RELEASE_TABLET | Freq: Every day | ORAL | 0 refills | Status: DC

## 2020-07-01 MED ORDER — SODIUM BICARBONATE 650 MG PO TABS: 650.0000 mg | ORAL_TABLET | Freq: Two times a day (BID) | ORAL | Status: DC

## 2020-07-01 MED ORDER — METFORMIN HCL 500 MG PO TABS: 500.0000 mg | ORAL_TABLET | Freq: Two times a day (BID) | ORAL | 1 refills | Status: DC

## 2020-07-01 MED ORDER — WARFARIN SODIUM 7.5 MG PO TABS: 7.5000 mg | ORAL_TABLET | Freq: Once | ORAL | Status: DC

## 2020-07-01 MED ORDER — CLOPIDOGREL BISULFATE 75 MG PO TABS: 75.0000 mg | ORAL_TABLET | Freq: Every day | ORAL | 3 refills | Status: DC

## 2020-07-01 MED ORDER — INSULIN GLARGINE 100 UNIT/ML ~~LOC~~ SOLN: 70.0000 [IU] | Freq: Every day | SUBCUTANEOUS | 11 refills | Status: DC

## 2020-07-01 MED ORDER — ROSUVASTATIN CALCIUM 20 MG PO TABS: 20.0000 mg | ORAL_TABLET | Freq: Every day | ORAL | 4 refills | Status: DC

## 2020-07-01 NOTE — Telephone Encounter (Signed)
Currently admitted 07/01/20

## 2020-07-01 NOTE — Progress Notes (Addendum)
Progress Note  Patient Name: John [name] Date of Encounter: 07/01/2020  [facility] Cardiologist: Minus Breeding, MD   Subjective   Creatinine continues to improve. No chest pain or sob. Patient is ready to go home.   Inpatient Medications    Scheduled Meds: . clopidogrel  75 mg Oral Q breakfast  . glipiZIDE  10 mg Oral Daily  . insulin aspart  0-15 Units Subcutaneous TID WC  . insulin aspart  0-5 Units Subcutaneous QHS  . insulin glargine  70 Units Subcutaneous QHS  . isosorbide mononitrate  30 mg Oral Daily  . metoprolol  200 mg Oral QPC breakfast  . pantoprazole  40 mg Oral Daily  . rosuvastatin  20 mg Oral Daily  . sodium bicarbonate  1,300 mg Oral BID  . sodium chloride flush  3 mL Intravenous Q12H  . warfarin  7.5 mg Oral ONCE-1600  . Warfarin - Pharmacist Dosing Inpatient   Does not apply q1600   Continuous Infusions: . sodium chloride     PRN Meds: sodium chloride, acetaminophen, alum & mag hydroxide-simeth, ondansetron (ZOFRAN) IV, sodium chloride flush, zolpidem   Vital Signs    Vitals:   06/30/20 0611 06/30/20 1352 06/30/20 1951 07/01/20 0342  BP: 107/60 (!) 95/57 98/61   Pulse: 65 71 72 68  Resp: 19 16 18 18   Temp: 97.7 F (36.5 C) 97.6 F (36.4 C) 98.7 F (37.1 C) 98.7 F (37.1 C)  TempSrc: Oral Oral Oral Oral  SpO2: 96%  98% 98%  Weight: 97 kg   97.1 kg  Height:        Intake/Output Summary (Last 24 hours) at 07/01/2020 0752 Last data filed at 07/01/2020 0344 Gross per 24 hour  Intake 480 ml  Output 500 ml  Net -20 ml   Last 3 Weights 07/01/2020 06/30/2020 06/29/2020  Weight (lbs) 214 lb 1.6 oz 213 lb 14.4 oz 216 lb 6.4 oz  Weight (kg) 97.115 kg 97.024 kg 98.158 kg      Telemetry    Afib, HR HR 60s, PVCs - Personally Reviewed  ECG    No new - Personally Reviewed  Physical Exam   GEN: No acute distress.   Neck: No JVD Cardiac: Irreg Irreg, no murmurs, rubs, or gallops.  Respiratory: Clear to auscultation  bilaterally. GI: Soft, nontender, non-distended  MS: No edema; No deformity. Neuro:  Nonfocal  Psych: Normal affect   Labs    High Sensitivity Troponin:   Recent Labs  Lab 06/20/20 1207 06/20/20 1448  TROPONINIHS 14 16      Chemistry Recent Labs  Lab 06/29/20 0845 06/30/20 0128 07/01/20 0135  NA 131* 136 138  K 4.2 4.9 4.7  CL 101 105 107  CO2 18* 22 22  GLUCOSE 136* 99 156*  BUN 89* 90* 81*  CREATININE 4.29* 3.80* 2.44*  CALCIUM 8.2* 8.2* 8.6*  GFRNONAA 13* 15* 28*  ANIONGAP 12 9 9      Hematology Recent Labs  Lab 06/29/20 0845 06/30/20 0128 07/01/20 0135  WBC 5.2 4.4 4.7  RBC 3.71* 3.40* 3.53*  HGB 11.0* 9.9* 10.1*  HCT 32.5* 29.8* 31.5*  MCV 87.6 87.6 89.2  MCH 29.6 29.1 28.6  MCHC 33.8 33.2 32.1  RDW 14.7 15.0 15.0  PLT 183 185 204    BNPNo results for input(s): BNP, PROBNP in the last 168 hours.   DDimer No results for input(s): DDIMER in the last 168 hours.   Radiology    No results found.  Cardiac  Studies    LHC 06/23/20:  Ost Cx lesion is 85% stenosed.  Mid LAD lesion is 100% stenosed.  Prox RCA lesion is 30% stenosed.  Prox RCA to Dist RCA lesion is 80% stenosed.  Origin lesion is 100% stenosed.  Prox LAD to Mid LAD lesion is 70% stenosed.  Severe native CAD with diffuse 7075% proximal LAD stenoses prior to total occlusion of the mid LAD; 80 to 90% ostial circumflex stenosis proximal to a high marginal/ramus intermediate length vessel; 30% proximal RCA stenosis with diffuse 80% mid to distal stenoses. There is collateralization to the PLA vessel from the left coronary circulation.  Patent LIMA to mid LAD.  Old occlusion of the vein graft which previously supplied the distal RCA.  Mild right heart pressure elevation. Mean PA pressure 22 mm.  Very mild aortic stenosis with a peak to peak gradient of 10 and mean gradient of 7.5 mmHg; AVA 2.4 cm   Diagnostic Dominance: Right   Interventional LHC  06/23/20 Conclusions: 1. Severe, calcified disease involving the distal LMCA extending into the ostial and proximal LCx, as seen on yesterday's diagnostic coronary angiogram. 2. Successful PCI using orbital atherectomy and drug-eluting stent placement in the distal LMCA into the proximal LCx using a Resolute Onyx 2.75 x 26 mm drug-eluting stent.  Plaque shift into the ramus intermedius necessitated kissing balloon inflation.  Final angiogram demonstrates 0% residual stenosis with TIMI-3 flow. 3. Calcified and stenotic left radial artery precluding advancement of a 72F slender glide sheath.  Recommend alternative access for future catheterizations.  Recommendations: 1. Restart heparin infusion 4 hours after femoral sheath removal if no evidence of bleeding complications.  Restart warfarin tonight, with heparin or enoxaparin bridging until INR therapeutic. 2. Continue triple therapy with aspirin, clopidogrel, and warfarin for 1 month, after which time aspirin can be discontinued.  12 months of therapy with warfarin and clopidogrel is recommended, if tolerated from a bleeding standpoint. 3. Aggressive secondary prevention.  Diagnostic Dominance: Right  Intervention    Echo 12/23/19: 1. EF remains severely depressed to slightly worse than echo done October  2019 . Left ventricular ejection fraction, by estimation, is 25 to 30%.  The left ventricle has severely decreased function. The left ventricle  demonstrates global hypokinesis. The  left ventricular internal cavity size was severely dilated. There is mild  left ventricular hypertrophy. Left ventricular diastolic parameters are  indeterminate.  2. Right ventricular systolic function is normal. The right ventricular  size is normal. There is moderately elevated pulmonary artery systolic  pressure.  3. Left atrial size was severely dilated.  4. Normal appearing mechanical bileaflet mitral valve replacement with no  PVL . The mitral  valve has been repaired/replaced. No evidence of mitral  valve regurgitation.  5. AS and mean gradient stable since echo done 07/03/18. The aortic valve  has an indeterminant number of cusps. Aortic valve regurgitation is mild.  Mild aortic valve stenosis.    Patient Profile     69 y.o. 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   Assessment & Plan    Unstable Angina - s/p LHV 06/23/20 with stent intervention 06/24/20 to severe, calcified distal LMCA extending into the ostial and proximal LCx with successful PCI using orbital atherectomy and DES placement  in the distal LMCA into the proximal LCx  - continue DAPT with plavix. No Aspirin with coumadin - No further chest pain  Acute on chronic CKD - baseline creatinine 1.4-1.5 however has been elevated during admission, up to 4.75.  -Suspected contrast induced nephrotoxicity from recent cath - nephrotoxic meds held - Nephrology consulted and recommend supportive care.  -Creatinine trending down, 5.17>0.01>7.49>4.49  Chronic systolic and diastolic HF s/p ICD placement 02/19/20 - Echo 12/23/19 with LVEF 25-30% and global hypokinesis, mild LVH, dilated LA, normal MVR with no evidence of MR - Normal functioning device 05/20/20 - euvolemic on exam  DM2 - A1C 7.0 - SSI during admission - Diabetes coordinator consulted for hypoglycemia  Mechanical MV - on coumadin therapy, management per pharmacy -PCP manages as OP  HLD - pt with history of joint/muscle pain on Lipitor, he is requesting another statin. He has been on pravastatin in the past.  - Will try crestor  Persistent afib - rate controlled on coumadin  For  questions or updates, please contact [place] Please consult [email or web address] for contact info under        Signed, Cadence Ninfa Meeker, PA-C   07/01/2020, 7:52 AM    I have seen and examined the patient along with Cadence Ninfa Meeker, PA-C  .  I have reviewed the chart, notes and new data.  I agree with PA/NP's note.  Key new complaints: feels well and eager to go home Key examination changes: no signs of hypervolemia. Weight unchanged from yesterday at 214 lb Key new findings / data: rapidly improving BUN and creat. Electrolytes OK. INR in target range.  PLAN:  With rapid improvement in renal function, I think we can restart the [facility] tomorrow. Hold diuretics. Check weight daily and call if weight >215 lb. Hold metformin until follow up. Avoid NSAIDs and other nephrotoxic agents. Future contrast based procedures should be recommended cautiously and he should be well-hydrated before them, off Entresto and other RAAS inh. Pravastatin replaced with rosuvastatin 20 mg daily to achieve LDL<70. Recheck in 2-3 months. ASA + clopidogrel + warfarin through 11/15, then clopidogrel for a year. Long term warfarin for mechanical MVR. Labs will be repeated on 11/02 f/u visit w Kerin Ransom. F/U w Dr. Candiss Norse at [facility].    Sanda Klein, MD, [place] [phone] 07/01/2020, 10:18 AM

## 2020-07-01 NOTE — Telephone Encounter (Signed)
Pt is scheduled for a [facility] hospital f/u with Kerin Ransom on 07/12/20 per Fabienne Bruns at 2:15 PM.

## 2020-07-01 NOTE — Progress Notes (Signed)
[facility] Progress Note    Assessment/ Plan:   1.  Unstable angina, history of coronary disease status post CABG.  Status post LHC 10/14 and PCI 10/15.  100 cc of contrast used for each procedure.  Now chest pain-free.  Management per cardiology 2.  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  AKI likely related to contrast-induced kidney injury whilst receiving Entresto. -kidney function improving, watch urine output closely, may start auto-diuresing -supportive care -Renal ultrasound showed slightly larger kidney (likely diabetic kidney disease), ck ok, ua bland (not suggestive of a GN like picture) -Continue to hold Entresto -No indication for renal replacement therapy -Kidney function overall improved today.  Patient is in recovery phase of his AKI 3.  Chronic systolic and diastolic heart failure, EF 25 to 30%.  Remains euvolemic 4.  Anemia, management per primary service.  Transfuse as needed 5.  History of hypertension now relatively hypotensive.  Antihypertensives on hold 6.  Diabetes mellitus type 2.  Management per primary service 7.  Metabolic acidosis, likely related to AKI.  Bicarb improved to 22-year and expected to improve as kidney function recovers.  To anticipate this, will drop his sodium bicarb to 650 mg twice daily.  Will sign off from a nephrology perspective.  Will need labs monitored as an outpatient which she can obtain through his PCP, if needed can obtain referral to come see [place] in the office.  Thank you for allowing [place] to participate in the care of this patient.  Subjective:   No new complaints.  Eager to go home.  Creatinine down to 2.4.  No changes in urinary frequency   Objective:   BP 98/61 (BP Location: Left Arm)   Pulse 68   Temp 98.7 F (37.1 C) (Oral)   Resp 18   Ht 5\' 11"  (1.803 m)   Wt 97.1 kg   SpO2 98%   BMI 29.86 kg/m   Intake/Output Summary (Last  24 hours) at 07/01/2020 0932 Last data filed at 07/01/2020 0344 Gross per 24 hour  Intake 280 ml  Output 500 ml  Net -220 ml   Weight change: 0.091 kg  Physical Exam: Gen: No acute distress CVS: S1-S2 regular rate and rhythm Resp: Clear to auscultation bilaterally, no W/R/R/C, unlabored, bilateral chest expansion Abd: Soft, nontender, nondistended Ext: No edema Skin: No rash Neuro: Speech clear and coherent, moves all extremities spontaneously, no asterixis  Imaging: No results found.  Labs: BMET Recent Labs  Lab 06/25/20 [phone] 06/26/20 0226 06/27/20 0250 06/28/20 0559 06/29/20 0845 06/30/20 0128 07/01/20 0135  NA 137 135 135 132* 131* 136 138  K 3.9 3.8 3.9 4.4 4.2 4.9 4.7  CL 104 103 103 99 101 105 107  CO2 21* 19* 21* 22 18* 22 22  GLUCOSE 206* 245* 139* 90 136* 99 156*  BUN 42* 54* 64* 79* 89* 90* 81*  CREATININE 1.68* 2.80* 3.83* 4.75* 4.29* 3.80* 2.44*  CALCIUM 8.4* 8.2* 7.9* 8.0* 8.2* 8.2* 8.6*   CBC Recent Labs  Lab 06/27/20 0250 06/29/20 0845 06/30/20 0128 07/01/20 0135  WBC 5.7 5.2 4.4 4.7  HGB 10.3* 11.0* 9.9* 10.1*  HCT 31.3* 32.5* 29.8* 31.5*  MCV 89.2 87.6 87.6 89.2  PLT 154 183 185 204    Medications:    . clopidogrel  75 mg Oral Q breakfast  . glipiZIDE  10 mg Oral Daily  . insulin aspart  0-15 Units Subcutaneous TID WC  .  insulin aspart  0-5 Units Subcutaneous QHS  . insulin glargine  70 Units Subcutaneous QHS  . isosorbide mononitrate  30 mg Oral Daily  . metoprolol  200 mg Oral QPC breakfast  . pantoprazole  40 mg Oral Daily  . rosuvastatin  20 mg Oral Daily  . sodium bicarbonate  1,300 mg Oral BID  . sodium chloride flush  3 mL Intravenous Q12H  . warfarin  7.5 mg Oral ONCE-1600  . Warfarin - Pharmacist Dosing Inpatient   Does not apply [place], MD [facility] 07/01/2020, 9:32 AM

## 2020-07-01 NOTE — Discharge Summary (Addendum)
Discharge Summary    Patient ID: John [name] MRN: [id number]; DOB: [birth year]  Admit date: 06/20/2020 Discharge date: 07/01/2020  Primary Care Provider: Claretta Fraise, MD  Primary Cardiologist: Minus Breeding, MD  Primary Electrophysiologist:  Cristopher Peru, MD   Discharge Diagnoses    Principal Problem:   Unstable angina [facility]) Active Problems:   Hyperlipidemia   HTN (hypertension)   ATRIAL FIBRILLATION   MITRAL VALVE REPLACEMENT, HX OF   CAD (coronary artery disease)   Ischemic cardiomyopathy   T2DM (type 2 diabetes mellitus) ([place])   Long term (current) use of anticoagulants [K27.06]   Systolic CHF ([place])   ICD (implantable cardioverter-defibrillator) in place   Aortic valve stenosis   Contrast dye induced nephropathy   Hypoglycemia    Diagnostic Studies/Procedures    LHC 06/23/20:  Ost Cx lesion is 85% stenosed.  Mid LAD lesion is 100% stenosed.  Prox RCA lesion is 30% stenosed.  Prox RCA to Dist RCA lesion is 80% stenosed.  Origin lesion is 100% stenosed.  Prox LAD to Mid LAD lesion is 70% stenosed.  Severe native CAD with diffuse 7075% proximal LAD stenoses prior to total occlusion of the mid LAD; 80 to 90% ostial circumflex stenosis proximal to a high marginal/ramus intermediate length vessel; 30% proximal RCA stenosis with diffuse 80% mid to distal stenoses. There is collateralization to the PLA vessel from the left coronary circulation.  Patent LIMA to mid LAD.  Old occlusion of the vein graft which previously supplied the distal RCA.  Mild right heart pressure elevation. Mean PA pressure 22 mm.  Very mild aortic stenosis with a peak to peak gradient of 10 and mean gradient of 7.5 mmHg; AVA 2.4 cm   Diagnostic Dominance: Right   Echo 12/23/19: 1. EF remains severely depressed to slightly worse than echo done October  2019 . Left ventricular ejection fraction, by estimation, is 25 to 30%.  The left ventricle has severely  decreased function. The left ventricle  demonstrates global hypokinesis. The  left ventricular internal cavity size was severely dilated. There is mild  left ventricular hypertrophy. Left ventricular diastolic parameters are  indeterminate.  2. Right ventricular systolic function is normal. The right ventricular  size is normal. There is moderately elevated pulmonary artery systolic  pressure.  3. Left atrial size was severely dilated.  4. Normal appearing mechanical bileaflet mitral valve replacement with no  PVL . The mitral valve has been repaired/replaced. No evidence of mitral  valve regurgitation.  5. AS and mean gradient stable since echo done 07/03/18. The aortic valve  has an indeterminant number of cusps. Aortic valve regurgitation is mild.  Mild aortic valve stenosis.  _____________   History of Present Illness     John [name] is a 69 y.o. male with pmh of CAD s/p CABG (patent LIMA, occluded SVG to RCA), chronic systolic and diastolic CHF (LVEF 23-76%), s/p mechanical mitral valve replacement 2/2 rheumatic heart disease, mild AS, s/p ICD placement, HTN, persistent afib, HLD who presented with unstable angina. He reported 2-3 weeks at home. Also reported SOB w/ activity. Symptoms resolved with rest.   Hospital Course     Consultants: Nephrology  Unstable angina/CAD s/p remote CABG - HS troponin was negative int he ED. EKG showed rate controlled afib with no acute changes - s/p LHC 06/23/20 with stent intervention 06/24/20 to severe, calcified distal LMCA extending into the ostial and proximal LCx with successful PCI using orbital atherectomy and DES placement in the distal LMCA into  the proximal LCx - DAPT with plavix and Aspirin and coumadin for 1 month with transition to plavix and coumadin. Continue plavix for at least 12 months - NO further chest pain - Cath site, right radial remained stable.  - Cardiac rehab saw the patient. He was able to ambulate without  further chest pain - Had contrast induced nephropathy. Nephrology consulted. Creatinine peaked at 4.75. Improved with supportive care. Creatinine at discharge 2.44 - Hgb stable - Continue home Toprol 200mg  - Started on Imdur however will stop at discharge for hypotension and plan to re-start home Entresto tomorrow   Acute on chronic CKD - Baseline creatinine 1.4-1.5  - Post intervention creatinine became elevated up to 4.75  - Nephrology was consulted. Suspect this was contrast induced nephropathy from recent cath - Nephrotoxic agents were held - Treated with supportive management - creatinine improved 4.75>4.29>3.80>2.44 at discharge - BMET in 1 week (can be done by PCP) - Plan to res-start [facility] tomorrow  Chronic systolic and diastolic CHF s/p ICD placement 03/01/20 - Echo 12/23/19 showed LVEF 25-30%, global hypokinesis, mild LVH, dilated LA, normal MVR with no MR - Normal functioning device 05/20/20 - Home Lasix 20 mg held for AKI - Patient remained euvolemic on exam - Plan to hold lasix until he is seen in follow-up - Recommended daily weights and to call if weight exceeds 215lbs - Plan to start home [facility] tomorrow. Continue BB  DM2 - A1C 7.0 - SSI during admission - Had some hypoglycemia during admission and diabetes coordinator was consulted - Discharge on home dose insulin. Hold metformin until follow-up.   Mechanical MV  - Echo 12/2019 showed stable valve with mean gradient 21mmHg  - On coumadin therapy followed by PCP  HLD - Patient reported a history of joint/muscle pain on Lipitor so this was changed to Crestor 20mg  daily. Will send in a new rx at discharge.  - LDL 82. Goal <70 - Needs follow-up FLB/LFTs in 6-8 weeks  HTN - Toprol 200 mg daily - Started on Imdur 30mg  daily however will stop on discharge for hypotension - Plan to restart Entresto tomorrow  Permanent Afib - Remained rate controlled on toprol - continue coumadin  Did the patient have an acute  coronary syndrome (MI, NSTEMI, STEMI, etc) this admission?:  No                               Did the patient have a percutaneous coronary intervention (stent / angioplasty)?:  Yes.     Cath/PCI Registry Performance & Quality Measures: 1. Aspirin prescribed? - No, on coumadin 2. ADP Receptor Inhibitor (Plavix/Clopidogrel, Brilinta/Ticagrelor or Effient/Prasugrel) prescribed (includes medically managed patients)? - Yes 3. High Intensity Statin (Lipitor 40-80mg  or Crestor 20-40mg ) prescribed? - Yes 4. For EF <40%, was ACEI/ARB prescribed? - Not Applicable (EF >/= 08%) 5. For EF <40%, Aldosterone Antagonist (Spironolactone or Eplerenone) prescribed? - Not Applicable (EF >/= 65%) 6. Cardiac Rehab Phase II ordered? - Yes   _____________  Discharge Vitals Blood pressure 135/64, pulse 77, temperature 98 F (36.7 C), temperature source Oral, resp. rate 16, height 5\' 11"  (1.803 m), weight 97.1 kg, SpO2 98 %.  Filed Weights   06/29/20 0549 06/30/20 0611 07/01/20 0342  Weight: 98.2 kg 97 kg 97.1 kg    Labs & Radiologic Studies    CBC Recent Labs    06/30/20 0128 07/01/20 0135  WBC 4.4 4.7  HGB 9.9* 10.1*  HCT  29.8* 31.5*  MCV 87.6 89.2  PLT 185 924   Basic Metabolic Panel Recent Labs    06/30/20 0128 07/01/20 0135  NA 136 138  K 4.9 4.7  CL 105 107  CO2 22 22  GLUCOSE 99 156*  BUN 90* 81*  CREATININE 3.80* 2.44*  CALCIUM 8.2* 8.6*   Liver Function Tests No results for input(s): AST, ALT, ALKPHOS, BILITOT, PROT, ALBUMIN in the last 72 hours. No results for input(s): LIPASE, AMYLASE in the last 72 hours. High Sensitivity Troponin:   Recent Labs  Lab 06/20/20 1207 06/20/20 1448  TROPONINIHS 14 16    BNP Invalid input(s): POCBNP D-Dimer No results for input(s): DDIMER in the last 72 hours. Hemoglobin A1C No results for input(s): HGBA1C in the last 72 hours. Fasting Lipid Panel No results for input(s): CHOL, HDL, LDLCALC, TRIG, CHOLHDL, LDLDIRECT in the last 72  hours. Thyroid Function Tests No results for input(s): TSH, T4TOTAL, T3FREE, THYROIDAB in the last 72 hours.  Invalid input(s): FREET3 _____________  DG Chest 2 View  Result Date: 06/20/2020 CLINICAL DATA:  Chest pain. EXAM: CHEST - 2 VIEW COMPARISON:  March 20, 2020. FINDINGS: Stable cardiomediastinal silhouette. Status post cardiac valve repair. No pneumothorax or pleural effusion is noted. Single lead right-sided pacemaker is unchanged. No acute pulmonary disease is noted. Bony thorax is unremarkable. IMPRESSION: No active cardiopulmonary disease. Electronically Signed   By: Marijo Conception M.D.   On: 06/20/2020 12:53   CARDIAC CATHETERIZATION  Result Date: 06/24/2020 Conclusions: 1. Severe, calcified disease involving the distal LMCA extending into the ostial and proximal LCx, as seen on yesterday's diagnostic coronary angiogram. 2. Successful PCI using orbital atherectomy and drug-eluting stent placement in the distal LMCA into the proximal LCx using a Resolute Onyx 2.75 x 26 mm drug-eluting stent.  Plaque shift into the ramus intermedius necessitated kissing balloon inflation.  Final angiogram demonstrates 0% residual stenosis with TIMI-3 flow. 3. Calcified and stenotic left radial artery precluding advancement of a 24F slender glide sheath.  Recommend alternative access for future catheterizations. Recommendations: 1. Restart heparin infusion 4 hours after femoral sheath removal if no evidence of bleeding complications.  Restart warfarin tonight, with heparin or enoxaparin bridging until INR therapeutic. 2. Continue triple therapy with aspirin, clopidogrel, and warfarin for 1 month, after which time aspirin can be discontinued.  12 months of therapy with warfarin and clopidogrel is recommended, if tolerated from a bleeding standpoint. 3. Aggressive secondary prevention. [name], MD [facility]   CARDIAC CATHETERIZATION  Result Date: 06/23/2020  [place] Cx lesion is 85% stenosed.  Mid  LAD lesion is 100% stenosed.  Prox RCA lesion is 30% stenosed.  Prox RCA to Dist RCA lesion is 80% stenosed.  Origin lesion is 100% stenosed.  Prox LAD to Mid LAD lesion is 70% stenosed.  Severe native CAD with diffuse 7075% proximal LAD stenoses prior to total occlusion of the mid LAD; 80 to 90% ostial circumflex stenosis proximal to a high marginal/ramus intermediate length vessel; 30% proximal RCA stenosis with diffuse 80% mid to distal stenoses.  There is collateralization to the PLA vessel from the left coronary circulation. Patent LIMA to mid LAD. Old occlusion of the vein graft which previously supplied the distal RCA. Mild right heart pressure elevation.  Mean PA pressure 22 mm. Very mild aortic stenosis with a peak to peak gradient of 10 and mean gradient of 7.5 mmHg; AVA 2.4 cm RECOMMENDATION: Angiograms will be reviewed with colleagues.  The LIMA to LAD is  widely patent.  The vein graft to the RCA is occluded and there is previously noted diffuse RCA disease.  The PLA vessel is well collateralized from the left coronary circulation.  Will heparinize post procedure.  Consider PCI of the circumflex ostium in this patient with a protected LAD circulation.  US RENAL  Result Date: 06/28/2020 CLINICAL DATA:  Acute kidney injury. EXAM: RENAL / URINARY TRACT ULTRASOUND COMPLETE COMPARISON:  CT 08/17/2014 FINDINGS: Right Kidney: Renal measurements: 13.3 x 7.5 x 6.6 cm = volume: 313 mL. Echogenicity within normal limits. No mass or hydronephrosis visualized. Size unchanged since CT scan of 2015. Left Kidney: Renal measurements: 12.5 x 6.0 x 5.5 cm = volume: 217 mL. Echogenicity within normal limits. No mass or hydronephrosis visualized. Size unchanged since CT scan of 2015. Bladder: Appears normal for degree of bladder distention. Other: None. IMPRESSION: Slightly large kidneys as might be seen with diabetes. Size unchanged since CT scan of 2015. No evidence of hydronephrosis or focal renal parenchymal  lesion. Electronically Signed   By: Nelson Chimes M.D.   On: 06/28/2020 15:07   Disposition   Pt is being discharged home today in good condition.  Follow-up Plans & Appointments     Follow-up Information    Erlene Quan, PA-C Follow up on 07/12/2020.   Specialties: Cardiology, Radiology Why: @ 2:15PM Contact information: [address] [place] [id number] [phone]              Discharge Instructions    Amb Referral to Cardiac Rehabilitation   Complete by: As directed    Diagnosis:  Coronary Stents PTCA     After initial evaluation and assessments completed: Virtual Based Care may be provided alone or in conjunction with Phase 2 Cardiac Rehab based on patient barriers.: Yes   Diet - low sodium heart healthy   Complete by: As directed    Discharge instructions   Complete by: As directed    No driving for 1 week. No lifting over 5 lbs for 5 days. No sexual activity for 1 week.  Keep procedure site clean & dry. If you notice increased pain, swelling, bleeding or pus, call/return!  You may shower, but no soaking baths/hot tubs/pools for 1 week.   Increase activity slowly   Complete by: As directed       Discharge Medications   Allergies as of 07/01/2020      Reactions   Doxycycline Hives      Medication List    STOP taking these medications   Dexilant 60 MG capsule Generic drug: dexlansoprazole Replaced by: pantoprazole 40 MG tablet   furosemide 40 MG tablet Commonly known as: LASIX   pravastatin 80 MG tablet Commonly known as: PRAVACHOL     TAKE these medications   aspirin 81 MG chewable tablet Chew 81 mg by mouth daily. Notes to patient: DO NOT TAKE ANYMORE   clopidogrel 75 MG tablet Commonly known as: PLAVIX Take 1 tablet (75 mg total) by mouth daily with breakfast. Notes to patient: PREVENTS CLOTTING IN STENT AND HEART ATTACK   glipiZIDE 10 MG 24 hr tablet Commonly known as: GLUCOTROL XL Take 1 tablet (10 mg total) by mouth  daily. Notes to patient: BLOOD SUGAR   insulin glargine 100 UNIT/ML injection Commonly known as: LANTUS Inject 0.7 mLs (70 Units total) into the skin at bedtime. What changed:   how much to take  when to take this  reasons to take this   metFORMIN 500 MG  tablet Commonly known as: GLUCOPHAGE Take 1 tablet (500 mg total) by mouth 2 (two) times daily with a meal. Hold until follow-up with PCP What changed: additional instructions Notes to patient: DO NOT RESTART UNTIL LABS CHECKED WITH PCP AND YOU ARE TOLD TO RESTART BY PHYSICIAN   metoprolol 200 MG 24 hr tablet Commonly known as: TOPROL-XL Take 1 tablet (200 mg total) by mouth daily after breakfast. Notes to patient: [place] LOWERS BLOOD PRESSURE  LOWERS HEART RATE   nitroGLYCERIN 0.4 MG SL tablet Commonly known as: NITROSTAT Place 1 tablet (0.4 mg total) under the tongue every 5 (five) minutes as needed for chest pain. Notes to patient: AS NEEDED FOR CHEST PAIN    OneTouch Delica Lancets 65V Misc 1 each by Other route 2 (two) times daily. Dx E11.9 Notes to patient: TESTING SUPPLIES   OneTouch Verio test strip Generic drug: glucose blood USE TO TEST TWICE A DAY Notes to patient: TESTING SUPPLIES   Osteo Bi-Flex Adv Triple St Tabs Take 1 tablet by mouth 2 (two) times a week. Notes to patient: SUPPLEMENT    pantoprazole 40 MG tablet Commonly known as: PROTONIX Take 1 tablet (40 mg total) by mouth daily. Replaces: Dexilant 60 MG capsule   rosuvastatin 20 MG tablet Commonly known as: CRESTOR Take 1 tablet (20 mg total) by mouth daily. Notes to patient: REPLACES PRAVASTIN    sacubitril-valsartan 97-103 MG Commonly known as: ENTRESTO Take 1 tablet by mouth 2 (two) times daily. Notes to patient: HEART FAILURE   sitaGLIPtin 50 MG tablet Commonly known as: Januvia Take 1 tablet (50 mg total) by mouth daily. Notes to patient: BLOOD SUGAR    triamcinolone cream 0.1 % Commonly known as:  KENALOG Apply 1 application topically 2 (two) times daily. Avoid face and genitalia   warfarin 5 MG tablet Commonly known as: COUMADIN Take as directed. If you are unsure how to take this medication, talk to your nurse or doctor. Original instructions: Take 1 tablet (5 mg total) by mouth daily. Notes to patient: [place] AND STROKE           Outstanding Labs/Studies   BMET in 1 week (can be done by PCP)  Duration of Discharge Encounter   Greater than 30 minutes including physician time.  Signed, Johnna Bollier Ninfa Meeker, PA-C 07/01/2020, 1:13 PM

## 2020-07-01 NOTE — Progress Notes (Signed)
[place] for coumadin Indication: mechanical mitral valve/afib  Allergies  Allergen Reactions  . Doxycycline Hives    Patient Measurements: Height: 5\' 11"  (180.3 cm) Weight: 97.1 kg (214 lb 1.6 oz) IBW/kg (Calculated) : 75.3 HEPARIN DW (KG): 94.1  Vital Signs: Temp: 98.7 F (37.1 C) (10/22 0342) Temp Source: Oral (10/22 0342) BP: 98/61 (10/21 1951) Pulse Rate: 68 (10/22 0342)  Labs: Recent Labs    06/28/20 1255 06/29/20 0247 06/29/20 0845 06/29/20 0845 06/30/20 0128 07/01/20 0135  HGB  --   --  11.0*   < > 9.9* 10.1*  HCT  --   --  32.5*  --  29.8* 31.5*  PLT  --   --  183  --  185 204  LABPROT  --  37.5*  --   --  30.5* 28.2*  INR  --  4.0*  --   --  3.0* 2.7*  CREATININE  --   --  4.29*  --  3.80* 2.44*  CKTOTAL 68  --   --   --   --   --    < > = values in this interval not displayed.    Estimated Creatinine Clearance: 34.9 mL/min (A) (by C-G formula based on SCr of 2.44 mg/dL (H)).   Assessment: Patient with chest pain concerning for unstable angina. Chronically anticoagulated with warfarin (5 mg daily) for mechanical mitral valve and afib. Pt s/p cath on 10/15. Pharmacy dosing warfarin (no procedures planned).  -INR= 4> 3.0 > 2.7  Goal of Therapy:  Heparin level 0.3-0.7 units/ml Monitor platelets by anticoagulation protocol: Yes  Goal INR is 2.5-3.5    Plan:  -Warfarin 7.5 mg po today -Daily INR  [place] Crane, Roylene Reason, [facility] Clinical Pharmacist  07/01/2020 7:20 AM   [facility] pharmacy phone numbers are listed on [email or web address]

## 2020-07-01 NOTE — Progress Notes (Signed)
[phone] Offered to walk but pt just bathed and would like to wait until he gets home. Reinforced plavix, watching sodium, MI restrictions and walking.  Will sign off since for probable discharge.Graylon Good RN BSN 07/01/2020 8:17 AM

## 2020-07-05 ENCOUNTER — Ambulatory Visit: Payer: PPO | Admitting: Family Medicine

## 2020-07-05 NOTE — Telephone Encounter (Signed)
Attempted TOC call.  Left message for pt to call back.  

## 2020-07-05 NOTE — Telephone Encounter (Signed)
Aly is returning [employer]

## 2020-07-05 NOTE — Telephone Encounter (Signed)
Patient contacted regarding discharge from [facility] on 07/01/2020 .  Patient understands to follow up with provider Kerin Ransom, PA-C on November 2 at 2:15 p.m. at [facility] location. Patient understands discharge instructions? Yes Patient understands medications and regiment? Yes Patient understands to bring all medications to this visit? Yes  Pt is enrolled in MyChart but prefers telephonic or mail communication.  Pt states he is recovering well from his cardiac catheterization. States wrist and groin sites are healing well and denies any bleeding, swelling or pain. States he is bruised in his groin but reports that it is consistently improving. Pt also states he is making positive dietary improvements. He has removed all soda from the house and is now only drinking water and a small amount of coffee each day. Pt also states he is reducing the amount of sausage and bacon he had been consuming prior to his procedure. Positive reinforcement given to pt for these health-promoting steps.  All questions answered. Pt verbalizes understanding.

## 2020-07-06 ENCOUNTER — Ambulatory Visit: Payer: PPO | Admitting: Oncology

## 2020-07-06 ENCOUNTER — Other Ambulatory Visit: Payer: PPO

## 2020-07-07 ENCOUNTER — Ambulatory Visit: Payer: PPO | Admitting: Family Medicine

## 2020-07-11 ENCOUNTER — Other Ambulatory Visit: Payer: Self-pay

## 2020-07-11 ENCOUNTER — Ambulatory Visit (INDEPENDENT_AMBULATORY_CARE_PROVIDER_SITE_OTHER): Payer: PPO | Admitting: Family Medicine

## 2020-07-11 ENCOUNTER — Encounter: Payer: Self-pay | Admitting: Family Medicine

## 2020-07-11 VITALS — BP 119/63 | HR 86 | Temp 97.5°F | Resp 20 | Ht 71.0 in | Wt 214.1 lb

## 2020-07-11 DIAGNOSIS — Z9889 Other specified postprocedural states: Secondary | ICD-10-CM

## 2020-07-11 DIAGNOSIS — I200 Unstable angina: Secondary | ICD-10-CM

## 2020-07-11 DIAGNOSIS — I482 Chronic atrial fibrillation, unspecified: Secondary | ICD-10-CM

## 2020-07-11 DIAGNOSIS — Z5181 Encounter for therapeutic drug level monitoring: Secondary | ICD-10-CM | POA: Diagnosis not present

## 2020-07-11 DIAGNOSIS — N1831 Chronic kidney disease, stage 3a: Secondary | ICD-10-CM

## 2020-07-11 DIAGNOSIS — E1122 Type 2 diabetes mellitus with diabetic chronic kidney disease: Secondary | ICD-10-CM

## 2020-07-11 DIAGNOSIS — Z794 Long term (current) use of insulin: Secondary | ICD-10-CM

## 2020-07-11 LAB — COAGUCHEK XS/INR WAIVED
INR: 6.6 (ref 0.9–1.1)
Prothrombin Time: 79.5 s

## 2020-07-11 NOTE — Progress Notes (Signed)
Cardiology Office Note  Date:  07/11/2020   ID:  BRIGHT SPIELMANN, DOB 12-Jul-1951, MRN [id number]  PCP:  Claretta Fraise, MD  Cardiologist:  Dr. Percival Spanish _____________  Hospital follow-up  _____________   History of Present Illness: John [name] is a 69 y.o. male who presents today for hospital follow-up. He has a history of CAD s/p CABG (patent LIMA, occluded SVG to RCA), chronic systolic and dyastolic CHF (LVEF 50-09%), s/p mechanical mitral valve replacement 2/2 rheumatic heart disease, mild AS, s/p ICD placement, HTN, persistent Afib, and HLD. The patient was recently hospitalized for unstable angina. Hs trop was negative and EKG showed afib rate controlled with no ischemic changes. He underwent cath 06/23/20 with subsequent stent intervention 06/24/20 to severe calcified distal LMCA extending into the ostial and proximal LCx with successful PCI using orbital atherectomy and DES placement in the distal LMCA into the prox LCx. He was started on DAPT with Aspirin and plavix. Imdur stopped for hypotension and he was ultimately started on home Entresto. The hospitalization complicated by AKI on CKD stage 3 with creatinine up to 4.75 and nephrology was consulted. IT was suspected contrast induced nephropathy while on Entresto. Nephrotoxic agents were held and supportive measures were taken with improvement of creatinine. At discharge creatinine was 2.44. Plan was for PCP to re-check in a week.   Today, he reports has been walking every day, about a quarter mile. He has chest tightness at the end of the walk along with shortness of breath. Chest tightness up to 4/10. Chest pain is relieved with rest. Pain will last about 5-10 minutes. It is similar to pain before stenting but not as severe. Has not needed SL Nitro. Cath site is stable. Patient has made diet changes, cutting down on sugar and fatty foods. He stopped drinking sodas. Says he has lost about 4-5 lbs 218lbs>213lbs. He denies significant  lower leg swelling on exam. He saw his PCP yesterday. INR apparently was high, up to 6.6 yesterday. PCP is managing this. No re-check of creatinine but was referred to nephrologist. He denies bleeding issues on plavix and Aspirin. He denies symptoms of palpitations, orthopnea, PND, lower extremity edema, claudication, dizziness, presyncope, syncope, bleeding, or neurologic sequela. The patient is tolerating medications without difficulties and is otherwise without complaint today.   _____________   Past Medical History:  Diagnosis Date  . Allergy   . Anemia    "lost blood w/the cancer"  . Arthritis    back   . Atrial fibrillation ([place])    takes Coumadin daily  . Bruises easily    takes Coumadin daily  . CAD (coronary artery disease)    a. s/p CABG 11/2000 (L-LAD, S-RCA);  b. Lexiscan Myoview 11/12: EF 45%, ischemia and possibly some scar at the base of the inferolateral wall;   c. Lex MV 11/13:  EF 44%, Inf and IL scar/soft tissue atten, small mild amt of inf ischemia,  . CHF (congestive heart failure) ([place])   . Constipation    takes Colace daily  . Enlarged prostate   . Flu 09/2013  . GERD (gastroesophageal reflux disease)    takes Nexium daily  . Glaucoma   . History of blood transfusion    "related to cancer"  . History of colon polyps   . HLD (hyperlipidemia)    takes Vytorin daily  . HTN (hypertension)    takes Amlodipine,Metoprolol, and Ramipril daily  . Insomnia    states he has always been like this.But  doesn't take any meds  . Kidney stones    "I passed them all"  . Nocturia   . Peripheral edema    takes Furosemide daily  . Peripheral neuropathy   . Rheumatic fever ~ 1965  . Rheumatic heart disease    a. s/p mechanical (St. Jude) MVR 2002;  b. Echo 5/11: Mild LVH, EF 45-50%, mild AS, mild AI, mean gradient 11 mmHg, MVR with normal gradients, moderate LAE, mild RAE;  c.  Echo 11/13:   mod LVH, EF 55-60%, mild AS (mean 18 mmHg), mild AI, severe LAE, mild RAE, PASP 41    . 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 SOB (shortness of breath)    with exertion  . Type II diabetes mellitus ([place])    takes Januvia,Glipizide,and Metformin daily as well as Lantus   Past Surgical History:  Procedure Laterality Date  . APPLICATION OF WOUND VAC  2014  . CARDIAC CATHETERIZATION  2002  . COLONOSCOPY    . CORONARY ARTERY BYPASS GRAFT  2002   x 3  . CORONARY ARTERY BYPASS GRAFT    . CORONARY ATHERECTOMY N/A 06/24/2020   Procedure: CORONARY ATHERECTOMY;  Surgeon: [name], MD;  Location: [facility];  Service: Cardiovascular;  Laterality: N/A;  . CORONARY BALLOON ANGIOPLASTY N/A 06/24/2020   Procedure: CORONARY BALLOON ANGIOPLASTY;  Surgeon: [name], MD;  Location: [facility];  Service: Cardiovascular;  Laterality: N/A;  . CORONARY STENT INTERVENTION N/A 06/24/2020   Procedure: CORONARY STENT INTERVENTION;  Surgeon: [name], MD;  Location: [facility];  Service: Cardiovascular;  Laterality: N/A;  . ESOPHAGOGASTRODUODENOSCOPY    . HERNIA REPAIR    . ICD IMPLANT N/A 02/19/2020   Procedure: ICD IMPLANT;  Surgeon: Evans Lance, MD;  Location: [facility];  Service: Cardiovascular;  Laterality: N/A;  . INCISIONAL HERNIA REPAIR N/A 03/15/2014   Procedure: LAPAROSCOPIC INCISIONAL HERNIA;  Surgeon: Harl Bowie, MD;  Location: [facility];  Service: General;  Laterality: N/A;  . INSERTION OF MESH N/A 03/15/2014   Procedure: INSERTION OF MESH;  Surgeon: Harl Bowie, MD;  Location: [facility];  Service: General;  Laterality: N/A;  . LAPAROSCOPIC INCISIONAL / UMBILICAL / [place]  03/15/2014   IHR  . LAPAROTOMY N/A 02/09/2013   Procedure: EXPLORATORY LAPAROTOMY WITH incisional biopsy intra- ABDOMINAL MASS ILOCECTOMY ;  Surgeon: Harl Bowie, MD;  Location: [facility];  Service: General;  Laterality: N/A;  . MITRAL VALVE REPLACEMENT  11/2000   #33 St. Jude mechanical mitral  valve prosthesis. Dr. Servando Snare  . PORT-A-CATH REMOVAL  03/15/2014  . PORT-A-CATH REMOVAL Left 03/15/2014   Procedure: REMOVAL PORT-A-CATH;  Surgeon: Harl Bowie, MD;  Location: [facility];  Service: General;  Laterality: Left;  . PORTACATH PLACEMENT N/A 02/09/2013   Procedure: INSERTION PORT-A-CATH;  Surgeon: Harl Bowie, MD;  Location: [facility];  Service: General;  Laterality: N/A;  . RIGHT/LEFT HEART CATH AND CORONARY/GRAFT ANGIOGRAPHY N/A 05/10/2017   Procedure: RIGHT/LEFT HEART CATH AND CORONARY/GRAFT ANGIOGRAPHY;  Surgeon: Martinique, Peter M, MD;  Location: [facility];  Service: Cardiovascular;  Laterality: N/A;  . RIGHT/LEFT HEART CATH AND CORONARY/GRAFT ANGIOGRAPHY N/A 06/23/2020   Procedure: RIGHT/LEFT HEART CATH AND CORONARY/GRAFT ANGIOGRAPHY;  Surgeon: Troy Sine, MD;  Location: [facility];  Service: Cardiovascular;  Laterality: N/A;  . SEPTOPLASTY    . SMALL INTESTINE SURGERY     _____________  Current Outpatient Medications  Medication Sig Dispense Refill  . aspirin 81 MG chewable tablet Chew 81 mg by mouth daily.    . clopidogrel (PLAVIX) 75 MG tablet Take 1 tablet (75 mg total) by mouth daily with breakfast. 90 tablet 3  . glipiZIDE (GLUCOTROL XL) 10 MG 24 hr tablet Take 1 tablet (10 mg total) by mouth daily. 90 tablet 1  . glucose blood (ONETOUCH VERIO) test strip USE TO TEST TWICE A DAY 200 strip 3  . insulin glargine (LANTUS) 100 UNIT/ML injection Inject 0.7 mLs (70 Units total) into the skin at bedtime. (Patient taking differently: Inject 70 Units into the skin. Take 70 units if over 180 at bedtime) 10 mL 11  . metFORMIN (GLUCOPHAGE) 500 MG tablet Take 1 tablet (500 mg total) by mouth 2 (two) times daily with a meal. Hold until follow-up with PCP (Patient not taking: Reported on 07/11/2020) 180 tablet 1  . metoprolol (TOPROL-XL) 200 MG 24 hr tablet Take 1 tablet (200 mg total) by mouth daily after breakfast. 90 tablet 0  . Misc Natural Products (OSTEO  BI-FLEX ADV TRIPLE ST) TABS Take 1 tablet by mouth 2 (two) times a week.     . nitroGLYCERIN (NITROSTAT) 0.4 MG SL tablet Place 1 tablet (0.4 mg total) under the tongue every 5 (five) minutes as needed for chest pain. 50 tablet 10  . OneTouch Delica Lancets 51Z MISC 1 each by Other route 2 (two) times daily. Dx E11.9 200 each 3  . pantoprazole (PROTONIX) 40 MG tablet Take 1 tablet (40 mg total) by mouth daily. 90 tablet 3  . rosuvastatin (CRESTOR) 20 MG tablet Take 1 tablet (20 mg total) by mouth daily. 60 tablet 4  . sacubitril-valsartan (ENTRESTO) 97-103 MG Take 1 tablet by mouth 2 (two) times daily. 180 tablet 2  . sitaGLIPtin (JANUVIA) 50 MG tablet Take 1 tablet (50 mg total) by mouth daily. 90 tablet 0  . triamcinolone cream (KENALOG) 0.1 % Apply 1 application topically 2 (two) times daily. Avoid face and genitalia (Patient not taking: Reported on 07/11/2020) 45 g 2  . warfarin (COUMADIN) 5 MG tablet Take 1 tablet (5 mg total) by mouth daily. 90 tablet 1   No current facility-administered medications for this visit.   _____________   Allergies:   Doxycycline  _____________   Social History:  The patient  reports that he has quit smoking. His smoking use included cigars. He quit after 40.00 years of use. He has never used smokeless tobacco. He reports that he does not drink alcohol and does not use drugs.  _____________   Family History:  The patient's family history includes Arthritis in his brother; Depression in his brother; Diabetes in his brother, brother, and mother; Heart disease in his brother and brother; Hyperlipidemia in his brother and brother; Hypertension in his brother, brother, and father; Thyroid disease in his sister.  _____________   ROS:  Please see the history of present illness.   Positive for occasional chest tightness, sob.   All other systems are reviewed and negative.  _____________   PHYSICAL EXAM: VS:  There were no vitals taken for this visit. , BMI There is  no height or weight on file to calculate BMI. GEN: Well nourished, well developed, in no acute distress  HEENT: normal  Neck: no JVD, carotid bruits, or masses Cardiac: Irreg Irreg; no murmurs, rubs, or gallops. No clubbing, cyanosis, minimal edema.  Radials/DP/PT 2+ and equal bilaterally.  Respiratory:  clear to auscultation  bilaterally, normal work of breathing GI: soft, nontender, nondistended, + BS MS: no deformity or atrophy  Skin: warm and dry, no rash Neuro:  Strength and sensation are intact Psych: euthymic mood, full affect _____________  EKG:   N/A  Recent Labs: 05/19/2020: ALT 14 06/20/2020: B Natriuretic Peptide 2,291.0 07/01/2020: BUN 81; Creatinine, Ser 2.44; Hemoglobin 10.1; Platelets 204; Potassium 4.7; Sodium 138  05/19/2020: Chol/HDL Ratio 4.8; Cholesterol, Total 130; HDL 27; LDL Chol Calc (NIH) 82; Triglycerides 116  Estimated Creatinine Clearance: 34.9 mL/min (A) (by C-G formula based on SCr of 2.44 mg/dL (H)).  Wt Readings from Last 3 Encounters:  07/11/20 214 lb 2 oz (97.1 kg)  07/01/20 214 lb 1.6 oz (97.1 kg)  06/20/20 222 lb (100.7 kg)      LHC 06/23/20:  Ost Cx lesion is 85% stenosed.  Mid LAD lesion is 100% stenosed.  Prox RCA lesion is 30% stenosed.  Prox RCA to Dist RCA lesion is 80% stenosed.  Origin lesion is 100% stenosed.  Prox LAD to Mid LAD lesion is 70% stenosed.  Severe native CAD with diffuse 7075% proximal LAD stenoses prior to total occlusion of the mid LAD; 80 to 90% ostial circumflex stenosis proximal to a high marginal/ramus intermediate length vessel; 30% proximal RCA stenosis with diffuse 80% mid to distal stenoses. There is collateralization to the PLA vessel from the left coronary circulation.  Patent LIMA to mid LAD.  Old occlusion of the vein graft which previously supplied the distal RCA.  Mild right heart pressure elevation. Mean PA pressure 22 mm.  Very mild aortic stenosis with a peak to peak gradient of 10 and  mean gradient of 7.5 mmHg; AVA 2.4 cm   Diagnostic Dominance: Right   Echo 12/23/19: 1. EF remains severely depressed to slightly worse than echo done October  2019 . Left ventricular ejection fraction, by estimation, is 25 to 30%.  The left ventricle has severely decreased function. The left ventricle  demonstrates global hypokinesis. The  left ventricular internal cavity size was severely dilated. There is mild  left ventricular hypertrophy. Left ventricular diastolic parameters are  indeterminate.  2. Right ventricular systolic function is normal. The right ventricular  size is normal. There is moderately elevated pulmonary artery systolic  pressure.  3. Left atrial size was severely dilated.  4. Normal appearing mechanical bileaflet mitral valve replacement with no  PVL . The mitral valve has been repaired/replaced. No evidence of mitral  valve regurgitation.  5. AS and mean gradient stable since echo done 07/03/18. The aortic valve  has an indeterminant number of cusps. Aortic valve regurgitation is mild.  Mild aortic valve stenosis.   _____________   ASSESSMENT AND PLAN:  CAD s/p remote CABG with recent stenting - Recent admission for unstable angina. He underwent cath 06/23/20 with subsequent stent intervention 06/24/20 to severe calcified distal LMCA extending into the ostial and proximal LCx with successful PCI using orbital atherectomy and DES placement in the distal LMCA into the prox LCx. - DAPT with Aspirin and Plavix. He denies bleeding issues. Plan to continue DAPT with coumadin for 1 month (07/25/20) followed by discontinuation of Aspirin and continuation of plavix and coumadin if tolerated. - Continue BB and statin - Patient has lost about 4-5 lbs with diet changes and daily walking. Encouraged lifestyle changes. - Patient has been walking 1/4 of a mile every day. He reports exertional chest tightness relieved with rest, similar to before stenting but not  as severe. Will start Imdur 30  mg daily. Recommend daily Bps with history of hypotension in the hospital. Continue to monitor symptoms.   AKI on CKD stage 3 - creatinine up to 4.75 during admission, suspected it was contrast induced injury whilst receiving Entresto. Nephrotoxic meds were held. Nephrology was consulted and supportive management was pursued. Discharge creatine was 2.44 - BMET today - Patient was referred to a nephrologist  Ischemic CM/Chronic systolic and diastolic CHF s/p ICD placement - Echo 12/23/19 showed LVEF 25-30%, global hypokinesis, mild LVH, dilated Lad, normal MVR with no MR - During hospitalization Lasix held for AKI. He was instructed to hold it until follow-up and call if weight exceeded 215lbs  - Relatively euvolemic on exam. Weights has been around 213lbs. If creatinine is at baseline (1.4-1.5) will restart lasix 20 mg daily - continue Entresto and BB  DM2 - A1C 7.0 - followed by PCP  Mechanical MV - Echo 12/2019 showed stable valve with mean gradient 4 mmHg - coumadin followed by PCP  HLD - Lipitor changed to crestor - LDL goal <70 - FLP and ALT at follow-up.   HTN - Patient was hypotensive during hospitalization. Imdur was trialed but BP was too low.  - BP today 134/72. He does not take Bps at home.  - continueToprol 200 mg and Entresto  - Start Imdur as above - Check Bps daily  Permanent Afib - rate controlled on coumadin - coumadin is followed by PCP  Disposition:   FU with APP/MD in 6-8 weeks   Signed, Francille Wittmann Ninfa Meeker, PA-C 07/11/2020 7:35 PM    _____________ [facility] [address] [place] [address]  [phone] (office) [phone] (fax)

## 2020-07-11 NOTE — Progress Notes (Signed)
Subjective:  Patient ID: John [name], male    DOB: Aug 02, 1951  Age: 69 y.o. MRN: [id number]  CC: Coagulation Disorder   HPI John [name] presents for hospital follow-up from recent admission.  He was discharged on 1022.  He was noted during that time to have unstable angina.  He underwent left heart catheterization and subsequent stenting. He was taken off of his Metformin due to the procedures.  He says his sugars have been doing quite well without it since that time.  He also has a request to be referred to a nephrologist, Dr. Gean Quint of [facility].  Depression screen [facility] 2/9 07/11/2020 06/01/2020 05/19/2020  Decreased Interest 0 0 0  Down, Depressed, Hopeless 0 0 0  PHQ - 2 Score 0 0 0  Altered sleeping - - -  Tired, decreased energy - - -  Change in appetite - - -  Feeling bad or failure about yourself  - - -  Trouble concentrating - - -  Moving slowly or fidgety/restless - - -  Suicidal thoughts - - -  PHQ-9 Score - - -  Some recent data might be hidden    History John [name] has a past medical history of Allergy, Anemia, Arthritis, Atrial fibrillation ([place]), Bruises easily, CAD (coronary artery disease), CHF (congestive heart failure) ([place]), Constipation, Enlarged prostate, Flu (09/2013), GERD (gastroesophageal reflux disease), Glaucoma, History of blood transfusion, History of colon polyps, HLD (hyperlipidemia), HTN (hypertension), Insomnia, Kidney stones, Nocturia, Peripheral edema, Peripheral neuropathy, Rheumatic fever (~ 1965), Rheumatic heart disease, S/P MVR (mitral valve replacement) (11/2000), Small bowel cancer ([place]) (2014), SOB (shortness of breath), and Type II diabetes mellitus ([place]).   He has a past surgical history that includes Mitral valve replacement (11/2000); laparotomy (N/A, 02/09/2013); Portacath placement (N/A, 02/09/2013); Coronary artery bypass graft (2002); Septoplasty; Application if wound vac (2014); Colonoscopy; Esophagogastroduodenoscopy;  Laparoscopic incisional / umbilical / ventral hernia repair (03/15/2014); Port-a-cath removal (03/15/2014); Hernia repair; Cardiac catheterization (2002); Incisional hernia repair (N/A, 03/15/2014); Port-a-cath removal (Left, 03/15/2014); Insertion of mesh (N/A, 03/15/2014); RIGHT/LEFT HEART CATH AND CORONARY/GRAFT ANGIOGRAPHY (N/A, 05/10/2017); Coronary artery bypass graft; Small intestine surgery; ICD IMPLANT (N/A, 02/19/2020); RIGHT/LEFT HEART CATH AND CORONARY/GRAFT ANGIOGRAPHY (N/A, 06/23/2020); CORONARY STENT INTERVENTION (N/A, 06/24/2020); CORONARY ATHERECTOMY (N/A, 06/24/2020); and CORONARY BALLOON ANGIOPLASTY (N/A, 06/24/2020).   His family history includes Arthritis in his brother; Depression in his brother; Diabetes in his brother, brother, and mother; Heart disease in his brother and brother; Hyperlipidemia in his brother and brother; Hypertension in his brother, brother, and father; Thyroid disease in his sister.He reports that he has quit smoking. His smoking use included cigars. He quit after 40.00 years of use. He has never used smokeless tobacco. He reports that he does not drink alcohol and does not use drugs.    ROS Review of Systems  Constitutional: Negative for fever.  HENT: Negative.   Respiratory: Negative for shortness of breath.   Cardiovascular: Negative for chest pain and leg swelling.  Gastrointestinal: Negative for abdominal pain.  Skin: Negative for rash.    Objective:  BP 119/63   Pulse 86   Temp (!) 97.5 F (36.4 C) (Temporal)   Resp 20   Ht 5\' 11"  (1.803 m)   Wt 214 lb 2 oz (97.1 kg)   SpO2 95%   BMI 29.86 kg/m   BP Readings from Last 3 Encounters:  07/11/20 119/63  07/01/20 135/64  06/20/20 126/65    Wt Readings from Last 3 Encounters:  07/11/20 214 lb  2 oz (97.1 kg)  07/01/20 214 lb 1.6 oz (97.1 kg)  06/20/20 222 lb (100.7 kg)     Physical Exam Vitals reviewed.  Constitutional:      Appearance: He is well-developed.  HENT:     Head: Normocephalic  and atraumatic.     Right Ear: Tympanic membrane and external ear normal. No decreased hearing noted.     Left Ear: Tympanic membrane and external ear normal. No decreased hearing noted.     Mouth/Throat:     Pharynx: No oropharyngeal exudate or posterior oropharyngeal erythema.  Eyes:     Pupils: Pupils are equal, round, and reactive to light.  Cardiovascular:     Rate and Rhythm: Normal rate and regular rhythm.     Heart sounds: No murmur heard.   Pulmonary:     Effort: No respiratory distress.     Breath sounds: Normal breath sounds.  Abdominal:     General: Bowel sounds are normal.     Palpations: Abdomen is soft. There is no mass.     Tenderness: There is no abdominal tenderness.  Musculoskeletal:     Cervical back: Normal range of motion and neck supple.       Assessment & Plan:   John [name] was seen today for coagulation disorder.  Diagnoses and all orders for this visit:  Chronic atrial fibrillation ([place]) -     CoaguChek XS/INR Waived  Encounter for therapeutic drug monitoring  MITRAL VALVE REPLACEMENT, HX OF  Unstable angina (HCC)  Type 2 diabetes mellitus with stage 3a chronic kidney disease, with long-term current use of insulin ([place]) -     Ambulatory referral to Nephrology   Hold coumadin 2 days. Resume 1/2 dose for one day. Then, follow up the next day.    I am having [name] maintain his Osteo Bi-Flex Adv Triple St, aspirin, triamcinolone cream, nitroGLYCERIN, OneTouch Delica Lancets 16X, OneTouch Verio, warfarin, metoprolol, glipiZIDE, sitaGLIPtin, sacubitril-valsartan, rosuvastatin, metFORMIN, pantoprazole, clopidogrel, and insulin glargine.  Allergies as of 07/11/2020      Reactions   Doxycycline Hives      Medication List       Accurate as of July 11, 2020  8:28 PM. If you have any questions, ask your nurse or doctor.        aspirin 81 MG chewable tablet Chew 81 mg by mouth daily.   clopidogrel 75 MG tablet Commonly known as:  PLAVIX Take 1 tablet (75 mg total) by mouth daily with breakfast.   glipiZIDE 10 MG 24 hr tablet Commonly known as: GLUCOTROL XL Take 1 tablet (10 mg total) by mouth daily.   insulin glargine 100 UNIT/ML injection Commonly known as: LANTUS Inject 0.7 mLs (70 Units total) into the skin at bedtime. What changed:   when to take this  additional instructions   metFORMIN 500 MG tablet Commonly known as: GLUCOPHAGE Take 1 tablet (500 mg total) by mouth 2 (two) times daily with a meal. Hold until follow-up with PCP   metoprolol 200 MG 24 hr tablet Commonly known as: TOPROL-XL Take 1 tablet (200 mg total) by mouth daily after breakfast.   nitroGLYCERIN 0.4 MG SL tablet Commonly known as: NITROSTAT Place 1 tablet (0.4 mg total) under the tongue every 5 (five) minutes as needed for chest pain.   OneTouch Delica Lancets 09U Misc 1 each by Other route 2 (two) times daily. Dx E11.9   OneTouch Verio test strip Generic drug: glucose blood USE TO TEST TWICE A DAY  Osteo Bi-Flex Adv Triple St Tabs Take 1 tablet by mouth 2 (two) times a week.   pantoprazole 40 MG tablet Commonly known as: PROTONIX Take 1 tablet (40 mg total) by mouth daily.   rosuvastatin 20 MG tablet Commonly known as: CRESTOR Take 1 tablet (20 mg total) by mouth daily.   sacubitril-valsartan 97-103 MG Commonly known as: ENTRESTO Take 1 tablet by mouth 2 (two) times daily.   sitaGLIPtin 50 MG tablet Commonly known as: Januvia Take 1 tablet (50 mg total) by mouth daily.   triamcinolone cream 0.1 % Commonly known as: KENALOG Apply 1 application topically 2 (two) times daily. Avoid face and genitalia   warfarin 5 MG tablet Commonly known as: COUMADIN Take as directed by the anticoagulation clinic. If you are unsure how to take this medication, talk to your nurse or doctor. Original instructions: Take 1 tablet (5 mg total) by mouth daily.        Follow-up: Return in about 3 days (around 07/14/2020) for  CAD, INR.  Claretta Fraise, M.D.

## 2020-07-12 ENCOUNTER — Ambulatory Visit: Payer: PPO | Admitting: Cardiology

## 2020-07-12 ENCOUNTER — Encounter: Payer: Self-pay | Admitting: Cardiology

## 2020-07-12 VITALS — BP 134/72 | HR 92 | Ht 71.0 in | Wt 213.0 lb

## 2020-07-12 DIAGNOSIS — E1122 Type 2 diabetes mellitus with diabetic chronic kidney disease: Secondary | ICD-10-CM

## 2020-07-12 DIAGNOSIS — I4821 Permanent atrial fibrillation: Secondary | ICD-10-CM | POA: Diagnosis not present

## 2020-07-12 DIAGNOSIS — Z952 Presence of prosthetic heart valve: Secondary | ICD-10-CM

## 2020-07-12 DIAGNOSIS — N179 Acute kidney failure, unspecified: Secondary | ICD-10-CM

## 2020-07-12 DIAGNOSIS — Z79899 Other long term (current) drug therapy: Secondary | ICD-10-CM | POA: Diagnosis not present

## 2020-07-12 DIAGNOSIS — I5022 Chronic systolic (congestive) heart failure: Secondary | ICD-10-CM

## 2020-07-12 DIAGNOSIS — I25119 Atherosclerotic heart disease of native coronary artery with unspecified angina pectoris: Secondary | ICD-10-CM

## 2020-07-12 DIAGNOSIS — I255 Ischemic cardiomyopathy: Secondary | ICD-10-CM

## 2020-07-12 DIAGNOSIS — N183 Chronic kidney disease, stage 3 unspecified: Secondary | ICD-10-CM | POA: Diagnosis not present

## 2020-07-12 MED ORDER — ISOSORBIDE MONONITRATE ER 30 MG PO TB24: 30.0000 mg | ORAL_TABLET | Freq: Every day | ORAL | 3 refills | Status: DC

## 2020-07-12 NOTE — Patient Instructions (Signed)
Medication Instructions:  START- Isosorbide 30 mg, take 1/2 tablet daily for 3 days then increase to 1 tablet daily *If you need a refill on your cardiac medications before your next appointment, please call your pharmacy*   Lab Work: BMP today  If you have labs (blood work) drawn today and your tests are completely normal, you will receive your results only by: Marland Kitchen MyChart Message (if you have MyChart) OR . A paper copy in the mail If you have any lab test that is abnormal or we need to change your treatment, we will call you to review the results.   Testing/Procedures: None Ordered   Follow-Up: At [facility], you and your health needs are our priority.  As part of our continuing mission to provide you with exceptional heart care, we have created designated Provider Care Teams.  These Care Teams include your primary Cardiologist (physician) and Advanced Practice Providers (APPs -  Physician Assistants and Nurse Practitioners) who all work together to provide you with the care you need, when you need it.  We recommend signing up for the patient portal called "MyChart".  Sign up information is provided on this After Visit Summary.  MyChart is used to connect with patients for Virtual Visits (Telemedicine).  Patients are able to view lab/test results, encounter notes, upcoming appointments, etc.  Non-urgent messages can be sent to your provider as well.   To learn more about what you can do with MyChart, go to [email or web address].    Your next appointment:   6-8  week(s)  The format for your next appointment:   In Person  Provider:   You may see Minus Breeding, MD or one of the following Advanced Practice Providers on your designated Care Team:    Tavious Griesinger, PA-C  Jory Sims, DNP, ANP

## 2020-07-12 NOTE — Addendum Note (Signed)
Addended by: Antony Madura on: 07/12/2020 03:21 PM   Modules accepted: Level of Service

## 2020-07-13 LAB — BASIC METABOLIC PANEL
BUN/Creatinine Ratio: 30 — ABNORMAL HIGH (ref 10–24)
BUN: 47 mg/dL — ABNORMAL HIGH (ref 8–27)
CO2: 20 mmol/L (ref 20–29)
Calcium: 9.2 mg/dL (ref 8.6–10.2)
Chloride: 111 mmol/L — ABNORMAL HIGH (ref 96–106)
Creatinine, Ser: 1.57 mg/dL — ABNORMAL HIGH (ref 0.76–1.27)
GFR calc Af Amer: 52 mL/min/{1.73_m2} — ABNORMAL LOW (ref >59)
GFR calc non Af Amer: 45 mL/min/{1.73_m2} — ABNORMAL LOW (ref >59)
Glucose: 124 mg/dL — ABNORMAL HIGH (ref 65–99)
Potassium: 5.6 mmol/L — ABNORMAL HIGH (ref 3.5–5.2)
Sodium: 143 mmol/L (ref 134–144)

## 2020-07-14 ENCOUNTER — Encounter: Payer: Self-pay | Admitting: Family Medicine

## 2020-07-14 ENCOUNTER — Telehealth: Payer: Self-pay | Admitting: Cardiology

## 2020-07-14 ENCOUNTER — Other Ambulatory Visit: Payer: Self-pay

## 2020-07-14 ENCOUNTER — Ambulatory Visit (INDEPENDENT_AMBULATORY_CARE_PROVIDER_SITE_OTHER): Payer: PPO | Admitting: Family Medicine

## 2020-07-14 VITALS — BP 122/69 | HR 73 | Temp 96.5°F | Resp 20 | Ht 71.0 in | Wt 215.0 lb

## 2020-07-14 DIAGNOSIS — Z9889 Other specified postprocedural states: Secondary | ICD-10-CM | POA: Diagnosis not present

## 2020-07-14 DIAGNOSIS — Z7901 Long term (current) use of anticoagulants: Secondary | ICD-10-CM

## 2020-07-14 DIAGNOSIS — Z794 Long term (current) use of insulin: Secondary | ICD-10-CM

## 2020-07-14 DIAGNOSIS — I482 Chronic atrial fibrillation, unspecified: Secondary | ICD-10-CM | POA: Diagnosis not present

## 2020-07-14 DIAGNOSIS — E119 Type 2 diabetes mellitus without complications: Secondary | ICD-10-CM

## 2020-07-14 DIAGNOSIS — I4811 Longstanding persistent atrial fibrillation: Secondary | ICD-10-CM | POA: Diagnosis not present

## 2020-07-14 DIAGNOSIS — Z79899 Other long term (current) drug therapy: Secondary | ICD-10-CM

## 2020-07-14 LAB — COAGUCHEK XS/INR WAIVED
INR: 2.6 — ABNORMAL HIGH (ref 0.9–1.1)
Prothrombin Time: 31.2 s

## 2020-07-14 LAB — POCT INR: INR: 2.6 (ref 2.0–3.0)

## 2020-07-14 MED ORDER — ENTRESTO 24-26 MG PO TABS: 1.0000 | ORAL_TABLET | Freq: Two times a day (BID) | ORAL | 11 refills | Status: DC

## 2020-07-14 MED ORDER — METFORMIN HCL 500 MG PO TABS: 500.0000 mg | ORAL_TABLET | Freq: Every day | ORAL | 1 refills | Status: DC

## 2020-07-14 NOTE — Telephone Encounter (Signed)
Left a message for the patient to call back about his labs results- he should speak to me if possible.  Kerin Ransom PA-C 07/14/2020 10:15 AM

## 2020-07-14 NOTE — Patient Instructions (Signed)
Resume Metformin at 1 pill every morning instead of twice daily.  Resume the Coumadin at 5 mg, 1 pill daily.  Check your blood sugar before breakfast and 2 hours after evening meal.  Write those down and bring them with you at your follow-up next week.  Be sure to eat the usual amount of greens such as collard's.  Do not eat more or less based on whether you are coming to the office.  That can cause [place] to have a false result on your INR.

## 2020-07-14 NOTE — Telephone Encounter (Signed)
° ° ° °  Pt is returning Luke's call

## 2020-07-14 NOTE — Telephone Encounter (Signed)
Patient states he is returning an additional call from [facility].

## 2020-07-14 NOTE — Telephone Encounter (Signed)
Pt made aware of medication changes, BMP lab slip mail to pt to get blood work in 1 week

## 2020-07-14 NOTE — Addendum Note (Signed)
Addended by: Vennie Homans on: 07/14/2020 02:16 PM   Modules accepted: Orders

## 2020-07-14 NOTE — Telephone Encounter (Signed)
Looks like Lurena Joiner called the patient, I have messaged him

## 2020-07-14 NOTE — Telephone Encounter (Signed)
I called the patient back about his lab work.  His BUN and creatinine have improved although his potassium is elevated.  He has an appointment to see a nephrologist.  The patient tells me he has had high potassium levels in the past that have required treatment.  I suggested we cut his Entresto back and recheck his potassium in a week.  He is agreeable with this plan.  Kerin Ransom PA-C 07/14/2020 1:33 PM

## 2020-07-14 NOTE — Progress Notes (Signed)
Subjective:  Patient ID: John [name], male    DOB: 08/26/1951  Age: 69 y.o. MRN: [id number]  CC: Coagulation Disorder   HPI John [name] presents for follow-up of his elevated INR from 3 days ago.  He had a BMP with estimated GFR done by his cardiologist 2 days ago.  This showed a creatinine of 1.57.  Much improved from his readings in the hospital.  His nephrology appointment is pending.  It has been authorized and we are waiting for an appointment time and date To be scheduled.  Currently he tells me that his sugars doing well in fact it was just a tad low a couple of times.  He also states that he tends to eat collard just when he is about to come in the office so his INR will not look too thin. Depression screen [facility] 2/9 07/14/2020 07/11/2020 06/01/2020  Decreased Interest 0 0 0  Down, Depressed, Hopeless 0 0 0  PHQ - 2 Score 0 0 0  Altered sleeping - - -  Tired, decreased energy - - -  Change in appetite - - -  Feeling bad or failure about yourself  - - -  Trouble concentrating - - -  Moving slowly or fidgety/restless - - -  Suicidal thoughts - - -  PHQ-9 Score - - -  Some recent data might be hidden    History John [name] has a past medical history of Allergy, Anemia, Arthritis, Atrial fibrillation ([place]), Bruises easily, CAD (coronary artery disease), CHF (congestive heart failure) ([place]), Constipation, Enlarged prostate, Flu (09/2013), GERD (gastroesophageal reflux disease), Glaucoma, History of blood transfusion, History of colon polyps, HLD (hyperlipidemia), HTN (hypertension), Insomnia, Kidney stones, Nocturia, Peripheral edema, Peripheral neuropathy, Rheumatic fever (~ 1965), Rheumatic heart disease, S/P MVR (mitral valve replacement) (11/2000), 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   He has a past surgical history that includes Mitral valve replacement (11/2000); laparotomy (N/A, 02/09/2013); Portacath placement (N/A,  02/09/2013); Coronary artery bypass graft (2002); Septoplasty; Application if wound vac (2014); Colonoscopy; Esophagogastroduodenoscopy; Laparoscopic incisional / umbilical / ventral hernia repair (03/15/2014); Port-a-cath removal (03/15/2014); Hernia repair; Cardiac catheterization (2002); Incisional hernia repair (N/A, 03/15/2014); Port-a-cath removal (Left, 03/15/2014); Insertion of mesh (N/A, 03/15/2014); RIGHT/LEFT HEART CATH AND CORONARY/GRAFT ANGIOGRAPHY (N/A, 05/10/2017); Coronary artery bypass graft; Small intestine surgery; ICD IMPLANT (N/A, 02/19/2020); RIGHT/LEFT HEART CATH AND CORONARY/GRAFT ANGIOGRAPHY (N/A, 06/23/2020); CORONARY STENT INTERVENTION (N/A, 06/24/2020); CORONARY ATHERECTOMY (N/A, 06/24/2020); and CORONARY BALLOON ANGIOPLASTY (N/A, 06/24/2020).   His family history includes Arthritis in his brother; Depression in his brother; Diabetes in his brother, brother, and mother; Heart disease in his brother and brother; Hyperlipidemia in his brother and brother; Hypertension in his brother, brother, and father; Thyroid disease in his sister.He reports that he has quit smoking. His smoking use included cigars. He quit after 40.00 years of use. He has never used smokeless tobacco. He reports that he does not drink alcohol and does not use drugs.    ROS Review of Systems  Constitutional: Negative for fever.  Respiratory: Negative for shortness of breath.   Cardiovascular: Negative for chest pain.  Musculoskeletal: Negative for arthralgias.  Skin: Negative for rash.    Objective:  BP 122/69   Pulse 73   Temp (!) 96.5 F (35.8 C) (Temporal)   Resp 20   Ht 5\' 11"  (1.803 m)   Wt 215 lb (97.5 kg)   SpO2 100%   BMI 29.99 kg/m  BP Readings from Last 3 Encounters:  07/14/20 122/69  07/12/20 134/72  07/11/20 119/63    Wt Readings from Last 3 Encounters:  07/14/20 215 lb (97.5 kg)  07/12/20 213 lb (96.6 kg)  07/11/20 214 lb 2 oz (97.1 kg)     Physical Exam Vitals reviewed.    Constitutional:      Appearance: He is well-developed.  HENT:     Head: Normocephalic and atraumatic.     Mouth/Throat:     Pharynx: No oropharyngeal exudate or posterior oropharyngeal erythema.  Eyes:     Pupils: Pupils are equal, round, and reactive to light.  Cardiovascular:     Rate and Rhythm: Normal rate and regular rhythm.     Heart sounds: No murmur heard.   Pulmonary:     Effort: No respiratory distress.     Breath sounds: Normal breath sounds.  Musculoskeletal:     Cervical back: Normal range of motion and neck supple.  Neurological:     Mental Status: He is alert and oriented to person, place, and time.       Assessment & Plan:   John [name] was seen today for coagulation disorder.  Diagnoses and all orders for this visit:  Chronic atrial fibrillation ([place]) -     CoaguChek XS/INR Waived  Type 2 diabetes mellitus without complication, with long-term current use of insulin (HCC) -     metFORMIN (GLUCOPHAGE) 500 MG tablet; Take 1 tablet (500 mg total) by mouth daily with breakfast.  Long term (current) use of anticoagulants  Longstanding persistent atrial fibrillation (HCC)  MITRAL VALVE REPLACEMENT, HX OF  Other orders -     POCT INR       I have changed John [name]'s metFORMIN. I am also having him maintain his Osteo Bi-Flex Adv Triple St, aspirin, triamcinolone cream, nitroGLYCERIN, OneTouch Delica Lancets 42A, OneTouch Verio, warfarin, metoprolol, glipiZIDE, sitaGLIPtin, sacubitril-valsartan, rosuvastatin, pantoprazole, clopidogrel, insulin glargine, and isosorbide mononitrate.  Allergies as of 07/14/2020      Reactions   Doxycycline Hives      Medication List       Accurate as of July 14, 2020 11:54 AM. If you have any questions, ask your nurse or doctor.        aspirin 81 MG chewable tablet Chew 81 mg by mouth daily.   clopidogrel 75 MG tablet Commonly known as: PLAVIX Take 1 tablet (75 mg total) by mouth daily with breakfast.    glipiZIDE 10 MG 24 hr tablet Commonly known as: GLUCOTROL XL Take 1 tablet (10 mg total) by mouth daily.   insulin glargine 100 UNIT/ML injection Commonly known as: LANTUS Inject 0.7 mLs (70 Units total) into the skin at bedtime. What changed:   when to take this  additional instructions   isosorbide mononitrate 30 MG 24 hr tablet Commonly known as: IMDUR Take 1 tablet (30 mg total) by mouth daily.   metFORMIN 500 MG tablet Commonly known as: GLUCOPHAGE Take 1 tablet (500 mg total) by mouth daily with breakfast. What changed:   when to take this  additional instructions Changed by: Claretta Fraise, MD   metoprolol 200 MG 24 hr tablet Commonly known as: TOPROL-XL Take 1 tablet (200 mg total) by mouth daily after breakfast.   nitroGLYCERIN 0.4 MG SL tablet Commonly known as: NITROSTAT Place 1 tablet (0.4 mg total) under the tongue every 5 (five) minutes as needed for chest pain.   OneTouch Delica Lancets 83M Misc 1 each by Other route 2 (two)  times daily. Dx E11.9   OneTouch Verio test strip Generic drug: glucose blood USE TO TEST TWICE A DAY   Osteo Bi-Flex Adv Triple St Tabs Take 1 tablet by mouth 2 (two) times a week.   pantoprazole 40 MG tablet Commonly known as: PROTONIX Take 1 tablet (40 mg total) by mouth daily.   rosuvastatin 20 MG tablet Commonly known as: CRESTOR Take 1 tablet (20 mg total) by mouth daily.   sacubitril-valsartan 97-103 MG Commonly known as: ENTRESTO Take 1 tablet by mouth 2 (two) times daily.   sitaGLIPtin 50 MG tablet Commonly known as: Januvia Take 1 tablet (50 mg total) by mouth daily.   triamcinolone cream 0.1 % Commonly known as: KENALOG Apply 1 application topically 2 (two) times daily. Avoid face and genitalia   warfarin 5 MG tablet Commonly known as: COUMADIN Take as directed by the anticoagulation clinic. If you are unsure how to take this medication, talk to your nurse or doctor. Original instructions: Take 1  tablet (5 mg total) by mouth daily.      Resume Metformin at 1 pill every morning instead of twice daily.  Resume the Coumadin at 5 mg, 1 pill daily.  Check your blood sugar before breakfast and 2 hours after evening meal.  Write those down and bring them with you at your follow-up next week.  Be sure to eat the usual amount of greens such as collard's.  Do not eat more or less based on whether you are coming to the office.  That can cause [place] to have a false result on your INR.  Follow-up: Return in about 1 week (around 07/21/2020) for INR.  Claretta Fraise, M.D.

## 2020-07-21 ENCOUNTER — Encounter: Payer: Self-pay | Admitting: Family Medicine

## 2020-07-21 ENCOUNTER — Ambulatory Visit (INDEPENDENT_AMBULATORY_CARE_PROVIDER_SITE_OTHER): Payer: PPO | Admitting: Family Medicine

## 2020-07-21 ENCOUNTER — Other Ambulatory Visit: Payer: Self-pay

## 2020-07-21 VITALS — BP 106/64 | HR 71 | Temp 97.1°F | Resp 20 | Ht 71.0 in | Wt 219.5 lb

## 2020-07-21 DIAGNOSIS — Z7901 Long term (current) use of anticoagulants: Secondary | ICD-10-CM

## 2020-07-21 DIAGNOSIS — I5023 Acute on chronic systolic (congestive) heart failure: Secondary | ICD-10-CM | POA: Diagnosis not present

## 2020-07-21 DIAGNOSIS — Z9889 Other specified postprocedural states: Secondary | ICD-10-CM

## 2020-07-21 DIAGNOSIS — I482 Chronic atrial fibrillation, unspecified: Secondary | ICD-10-CM

## 2020-07-21 DIAGNOSIS — Z95810 Presence of automatic (implantable) cardiac defibrillator: Secondary | ICD-10-CM | POA: Diagnosis not present

## 2020-07-21 LAB — POCT INR: INR: 6.5 — AB (ref 2.0–3.0)

## 2020-07-21 LAB — COAGUCHEK XS/INR WAIVED
INR: 6.3 (ref 0.9–1.1)
Prothrombin Time: 76.2 s

## 2020-07-21 NOTE — Progress Notes (Signed)
Subjective:  Patient ID: John [name], male    DOB: Dec 22, 1950  Age: 69 y.o. MRN: [id number]  CC: Coagulation Disorder   HPI Dawayne L Fickling presents for Patient in for follow-up of atrial fibrillation. Patient denies any recent bouts of chest pain or palpitations. Additionally, patient is taking anticoagulants. Patient denies any recent excessive bleeding episodes including epistaxis, bleeding from the gums, genitalia, rectal bleeding or hematuria. Additionally there has been no excessive bruising.    Depression screen [facility] 2/9 07/21/2020 07/14/2020 07/11/2020  Decreased Interest 0 0 0  Down, Depressed, Hopeless 0 0 0  PHQ - 2 Score 0 0 0  Altered sleeping - - -  Tired, decreased energy - - -  Change in appetite - - -  Feeling bad or failure about yourself  - - -  Trouble concentrating - - -  Moving slowly or fidgety/restless - - -  Suicidal thoughts - - -  PHQ-9 Score - - -  Some recent data might be hidden    History Indigo has a past medical history of Allergy, Anemia, Arthritis, Atrial fibrillation ([place]), Bruises easily, CAD (coronary artery disease), CHF (congestive heart failure) ([place]), Constipation, Enlarged prostate, Flu (09/2013), GERD (gastroesophageal reflux disease), Glaucoma, History of blood transfusion, History of colon polyps, HLD (hyperlipidemia), HTN (hypertension), Insomnia, Kidney stones, Nocturia, Peripheral edema, Peripheral neuropathy, Rheumatic fever (~ 1965), Rheumatic heart disease, S/P MVR (mitral valve replacement) (11/2000), 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   He has a past surgical history that includes Mitral valve replacement (11/2000); laparotomy (N/A, 02/09/2013); Portacath placement (N/A, 02/09/2013); Coronary artery bypass graft (2002); Septoplasty; Application if wound vac (2014); Colonoscopy; Esophagogastroduodenoscopy; Laparoscopic incisional / umbilical / ventral hernia repair  (03/15/2014); Port-a-cath removal (03/15/2014); Hernia repair; Cardiac catheterization (2002); Incisional hernia repair (N/A, 03/15/2014); Port-a-cath removal (Left, 03/15/2014); Insertion of mesh (N/A, 03/15/2014); RIGHT/LEFT HEART CATH AND CORONARY/GRAFT ANGIOGRAPHY (N/A, 05/10/2017); Coronary artery bypass graft; Small intestine surgery; ICD IMPLANT (N/A, 02/19/2020); RIGHT/LEFT HEART CATH AND CORONARY/GRAFT ANGIOGRAPHY (N/A, 06/23/2020); CORONARY STENT INTERVENTION (N/A, 06/24/2020); CORONARY ATHERECTOMY (N/A, 06/24/2020); and CORONARY BALLOON ANGIOPLASTY (N/A, 06/24/2020).   His family history includes Arthritis in his brother; Depression in his brother; Diabetes in his brother, brother, and mother; Heart disease in his brother and brother; Hyperlipidemia in his brother and brother; Hypertension in his brother, brother, and father; Thyroid disease in his sister.He reports that he has quit smoking. His smoking use included cigars. He quit after 40.00 years of use. He has never used smokeless tobacco. He reports that he does not drink alcohol and does not use drugs.    ROS Review of Systems  Constitutional: Negative for fever.  Respiratory: Negative for shortness of breath.   Cardiovascular: Positive for leg swelling (recent reduction in entresto. Off of lasix). Negative for chest pain.  Musculoskeletal: Negative for arthralgias.  Skin: Negative for rash.    Objective:  BP 106/64   Pulse 71   Temp (!) 97.1 F (36.2 C) (Temporal)   Resp 20   Ht 5\' 11"  (1.803 m)   Wt 219 lb 8 oz (99.6 kg)   SpO2 100%   BMI 30.61 kg/m   BP Readings from Last 3 Encounters:  07/21/20 106/64  07/14/20 122/69  07/12/20 134/72    Wt Readings from Last 3 Encounters:  07/21/20 219 lb 8 oz (99.6 kg)  07/14/20 215 lb (97.5 kg)  07/12/20 213 lb (96.6 kg)     Physical Exam Vitals  reviewed.  Constitutional:      Appearance: He is well-developed.  HENT:     Head: Normocephalic and atraumatic.     Right Ear:  External ear normal.     Left Ear: External ear normal.     Mouth/Throat:     Pharynx: No oropharyngeal exudate or posterior oropharyngeal erythema.  Eyes:     Pupils: Pupils are equal, round, and reactive to light.  Cardiovascular:     Rate and Rhythm: Normal rate and regular rhythm.     Heart sounds: No murmur heard.   Pulmonary:     Effort: No respiratory distress.     Breath sounds: Normal breath sounds.  Musculoskeletal:     Cervical back: Normal range of motion and neck supple.     Right lower leg: Edema present.     Left lower leg: Edema present.  Neurological:     Mental Status: He is alert and oriented to person, place, and time.       Assessment & Plan:   Quintin was seen today for coagulation disorder.  Diagnoses and all orders for this visit:  Chronic atrial fibrillation ([place]) -     CoaguChek XS/INR Waived  Long term (current) use of anticoagulants  ICD (implantable cardioverter-defibrillator) in place  MITRAL VALVE REPLACEMENT, HX OF  Acute on chronic systolic congestive heart failure (HCC) -     furosemide (LASIX) 20 MG tablet; Take 1 tablet (20 mg total) by mouth daily.  Other orders -     POCT INR      I have changed Rhyker L. Botelho's furosemide. I am also having him maintain his Osteo Bi-Flex Adv Triple St, aspirin, triamcinolone cream, nitroGLYCERIN, OneTouch Delica Lancets 16X, OneTouch Verio, warfarin, metoprolol, glipiZIDE, sitaGLIPtin, rosuvastatin, pantoprazole, clopidogrel, insulin glargine, isosorbide mononitrate, metFORMIN, and Entresto.  Allergies as of 07/21/2020      Reactions   Doxycycline Hives      Medication List       Accurate as of July 21, 2020 11:59 PM. If you have any questions, ask your nurse or doctor.        aspirin 81 MG chewable tablet Chew 81 mg by mouth daily.   clopidogrel 75 MG tablet Commonly known as: PLAVIX Take 1 tablet (75 mg total) by mouth daily with breakfast.   Entresto 49-51 MG Generic  drug: sacubitril-valsartan Take 1 tablet by mouth 2 (two) times daily.   Entresto 24-26 MG Generic drug: sacubitril-valsartan Take 1 tablet by mouth 2 (two) times daily.   furosemide 20 MG tablet Commonly known as: LASIX Take 1 tablet (20 mg total) by mouth daily. Started by: Claretta Fraise, MD   glipiZIDE 10 MG 24 hr tablet Commonly known as: GLUCOTROL XL Take 1 tablet (10 mg total) by mouth daily.   insulin glargine 100 UNIT/ML injection Commonly known as: LANTUS Inject 0.7 mLs (70 Units total) into the skin at bedtime. What changed:   when to take this  additional instructions   isosorbide mononitrate 30 MG 24 hr tablet Commonly known as: IMDUR Take 1 tablet (30 mg total) by mouth daily.   metFORMIN 500 MG tablet Commonly known as: GLUCOPHAGE Take 1 tablet (500 mg total) by mouth daily with breakfast.   metoprolol 200 MG 24 hr tablet Commonly known as: TOPROL-XL Take 1 tablet (200 mg total) by mouth daily after breakfast.   nitroGLYCERIN 0.4 MG SL tablet Commonly known as: NITROSTAT Place 1 tablet (0.4 mg total) under the tongue every 5 (five) minutes  as needed for chest pain.   OneTouch Delica Lancets 36P Misc 1 each by Other route 2 (two) times daily. Dx E11.9   OneTouch Verio test strip Generic drug: glucose blood USE TO TEST TWICE A DAY   Osteo Bi-Flex Adv Triple St Tabs Take 1 tablet by mouth 2 (two) times a week.   pantoprazole 40 MG tablet Commonly known as: PROTONIX Take 1 tablet (40 mg total) by mouth daily.   rosuvastatin 20 MG tablet Commonly known as: CRESTOR Take 1 tablet (20 mg total) by mouth daily.   sitaGLIPtin 50 MG tablet Commonly known as: Januvia Take 1 tablet (50 mg total) by mouth daily.   triamcinolone cream 0.1 % Commonly known as: KENALOG Apply 1 application topically 2 (two) times daily. Avoid face and genitalia   warfarin 5 MG tablet Commonly known as: COUMADIN Take as directed by the anticoagulation clinic. If you are  unsure how to take this medication, talk to your nurse or doctor. Original instructions: Take 1 tablet (5 mg total) by mouth daily.      Warfarin Therapy Instructions Tablets on hand:  5 mg               Thu 11/11 Fri 11/12 Sat 11/13               Take  0 mg   Take  0 mg    tablet           See below     See below   Total  2.5 mg       Thu 11/11 Warning icon      Do not take your warfarin dose today!       Fri 11/12 Warning icon      Do not take your warfarin dose today!                  Sun 11/14 Mon 11/15 Tue 11/16 Wed 11/17 Thu 11/18      tablet   1 tablet    tablet   1 tablet        Total  2.5 mg   Total  5 mg   Total  2.5 mg   Total  5 mg   See below         Thu 11/18 Return icon                        Follow-up: Return in about 1 week (around 07/28/2020).  Claretta Fraise, M.D.

## 2020-07-22 ENCOUNTER — Telehealth: Payer: Self-pay | Admitting: Cardiology

## 2020-07-22 ENCOUNTER — Telehealth: Payer: Self-pay

## 2020-07-22 NOTE — Telephone Encounter (Signed)
Pt wants to know why sacubitril-valsartan (ENTRESTO) 24-26 MG is on med list. He says that he is taking 49-51MG .

## 2020-07-22 NOTE — Telephone Encounter (Signed)
Spoke with pt's wife (okay per DPR). Attempted to call pt directly but had to leave a message. Confirmed that patient is on Enresto 49-51 mg, which is a reduced dose.   Per on-site provider Almyra Deforest, PA-C, pt may need to increase dose of lasix added today by PCP based on his response. Asked wife to try and get in touch with pt to have him call our office back for assessment of effectiveness of lasix. Advised wife that pt needs to go to the hospital for any worsening shortness of breath. Virtual follow up visit scheduled for 08/02/20 at 09:15. Pt to continue to log weights and blood pressures. Wife verbalizes understanding.

## 2020-07-22 NOTE — Telephone Encounter (Signed)
Patient states he looked through his list of medications and saw that 2 entrestos are listed. He states he is taking the 49-51 mg dose, not the 24-26 mg one.

## 2020-07-22 NOTE — Telephone Encounter (Signed)
Wife called. She is following up from the patient's call from first thing this morning. The wife is concerned because she has not heard anything from the Doctor yet. Please advise

## 2020-07-22 NOTE — Telephone Encounter (Signed)
Pt c/o medication issue:  1. Name of Medication:  sacubitril-valsartan (ENTRESTO) 49-51 MG  isosorbide mononitrate (IMDUR) 30 MG 24 hr tablet     2. How are you currently taking this medication (dosage and times per day)? entresto 1 tablet twice a day, isosorbide 1 tablet daily  3. Are you having a reaction (difficulty breathing--STAT)? no  4. What is your medication issue? Patient states his entresto was dropped to a lower dose and he was given isosorbide. He states since these changes his breathing has gotten harder again. He would like to know if there is a stronger dose of the isosorbide he can take.

## 2020-07-22 NOTE — Telephone Encounter (Signed)
Spoke with pt directly, who reports having urinated frequently throughout the day after his first dose of lasix this a.m. Pt states his shortness of breath is somewhat improved. Pt will therefore remain on Lasix at the 20 mg daily dose. Reiterated to pt to seek evaluation for increased shortness of breath. Pt aware of virtual visit appointment date/time and states he will log his daily blood pressures and weights in preparation for that appointment.

## 2020-07-22 NOTE — Telephone Encounter (Signed)
Pt states that since his [name] was decreased he has been feeling increasingly short of breath. Began to notice symptoms 2 days after starting the lower dose. He states he is becoming limited in the activities he is used to doing because of this. Reports needing to elevate head to breathe comfortably at night, but has woken up from the sensation of breathlessness in spite of adding more pillows.  BP this a.m. 127/73. Weight 218 today, states he has gained 1 lb over the last week. Pt states he is drink less than 6 cups of liquid per day.  Pt saw his PCP yesterday who added 20 mg Lasix p.o. daily  Today is the first day he'll be starting that.  Denies chest pain, lightheadedness, dizziness. Instructed pt to seek urgent evaluation should those symptoms develop. Will consult MD for further instructions.

## 2020-07-22 NOTE — Telephone Encounter (Signed)
Patient aware to call office that put medication on there.

## 2020-07-23 NOTE — Telephone Encounter (Signed)
Agree with suggestions 

## 2020-07-24 ENCOUNTER — Encounter: Payer: Self-pay | Admitting: Family Medicine

## 2020-07-24 MED ORDER — FUROSEMIDE 20 MG PO TABS: 20.0000 mg | ORAL_TABLET | Freq: Every day | ORAL | 2 refills | Status: DC

## 2020-07-28 ENCOUNTER — Other Ambulatory Visit: Payer: Self-pay

## 2020-07-28 ENCOUNTER — Ambulatory Visit (INDEPENDENT_AMBULATORY_CARE_PROVIDER_SITE_OTHER): Payer: PPO | Admitting: Family Medicine

## 2020-07-28 VITALS — BP 119/67 | HR 80 | Temp 97.2°F | Ht 71.0 in | Wt 212.8 lb

## 2020-07-28 DIAGNOSIS — Z7901 Long term (current) use of anticoagulants: Secondary | ICD-10-CM

## 2020-07-28 LAB — COAGUCHEK XS/INR WAIVED
INR: 2.7 — ABNORMAL HIGH (ref 0.9–1.1)
Prothrombin Time: 31.9 s

## 2020-07-28 LAB — POCT INR: INR: 2.7 (ref 2.0–3.0)

## 2020-07-28 NOTE — Progress Notes (Signed)
Subjective:  Patient ID: John [name], male    DOB: 10/11/1950  Age: 69 y.o. MRN: [id number]  CC: Follow-up   HPI John [name] presents for recheck of his INR. He has been having fluctuations recently. He also brings in multiple glucose readings The readings in the mornings run 100-180. Postprandial run 100-236.    Depression screen [facility] 2/9 07/28/2020 07/21/2020 07/14/2020  Decreased Interest 0 0 0  Down, Depressed, Hopeless 0 0 0  PHQ - 2 Score 0 0 0  Altered sleeping - - -  Tired, decreased energy - - -  Change in appetite - - -  Feeling bad or failure about yourself  - - -  Trouble concentrating - - -  Moving slowly or fidgety/restless - - -  Suicidal thoughts - - -  PHQ-9 Score - - -  Some recent data might be hidden    History John [name] has a past medical history of Allergy, Anemia, Arthritis, Atrial fibrillation ([place]), Bruises easily, CAD (coronary artery disease), CHF (congestive heart failure) ([place]), Constipation, Enlarged prostate, Flu (09/2013), GERD (gastroesophageal reflux disease), Glaucoma, History of blood transfusion, History of colon polyps, HLD (hyperlipidemia), HTN (hypertension), Insomnia, Kidney stones, Nocturia, Peripheral edema, Peripheral neuropathy, Rheumatic fever (~ 1965), Rheumatic heart disease, S/P MVR (mitral valve replacement) (11/2000), 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   He has a past surgical history that includes Mitral valve replacement (11/2000); laparotomy (N/A, 02/09/2013); Portacath placement (N/A, 02/09/2013); Coronary artery bypass graft (2002); Septoplasty; Application if wound vac (2014); Colonoscopy; Esophagogastroduodenoscopy; Laparoscopic incisional / umbilical / ventral hernia repair (03/15/2014); Port-a-cath removal (03/15/2014); Hernia repair; Cardiac catheterization (2002); Incisional hernia repair (N/A, 03/15/2014); Port-a-cath removal (Left, 03/15/2014); Insertion of mesh (N/A,  03/15/2014); RIGHT/LEFT HEART CATH AND CORONARY/GRAFT ANGIOGRAPHY (N/A, 05/10/2017); Coronary artery bypass graft; Small intestine surgery; ICD IMPLANT (N/A, 02/19/2020); RIGHT/LEFT HEART CATH AND CORONARY/GRAFT ANGIOGRAPHY (N/A, 06/23/2020); CORONARY STENT INTERVENTION (N/A, 06/24/2020); CORONARY ATHERECTOMY (N/A, 06/24/2020); and CORONARY BALLOON ANGIOPLASTY (N/A, 06/24/2020).   His family history includes Arthritis in his brother; Depression in his brother; Diabetes in his brother, brother, and mother; Heart disease in his brother and brother; Hyperlipidemia in his brother and brother; Hypertension in his brother, brother, and father; Thyroid disease in his sister.He reports that he has quit smoking. His smoking use included cigars. He quit after 40.00 years of use. He has never used smokeless tobacco. He reports that he does not drink alcohol and does not use drugs.    ROS Review of Systems Noncontributory Objective:  BP 119/67   Pulse 80   Temp (!) 97.2 F (36.2 C) (Temporal)   Ht 5\' 11"  (1.803 m)   Wt 212 lb 12.8 oz (96.5 kg)   BMI 29.68 kg/m   BP Readings from Last 3 Encounters:  07/28/20 119/67  07/21/20 106/64  07/14/20 122/69    Wt Readings from Last 3 Encounters:  07/28/20 212 lb 12.8 oz (96.5 kg)  07/21/20 219 lb 8 oz (99.6 kg)  07/14/20 215 lb (97.5 kg)     Physical Exam Vitals reviewed.  Constitutional:      Appearance: He is well-developed.  HENT:     Head: Normocephalic and atraumatic.     Right Ear: External ear normal.     Left Ear: External ear normal.     Mouth/Throat:     Pharynx: No oropharyngeal exudate or posterior oropharyngeal erythema.  Eyes:     Pupils: Pupils are equal, round, and  reactive to light.  Cardiovascular:     Rate and Rhythm: Normal rate and regular rhythm.     Heart sounds: No murmur heard.   Pulmonary:     Effort: No respiratory distress.     Breath sounds: Normal breath sounds.  Musculoskeletal:     Cervical back: Normal range  of motion and neck supple.  Neurological:     Mental Status: He is alert and oriented to person, place, and time.     Results for orders placed or performed in visit on 07/28/20  CoaguChek XS/INR Waived  Result Value Ref Range   INR 2.7 (H) 0.9 - 1.1   Prothrombin Time 31.9 sec  POCT INR  Result Value Ref Range   INR 2.7 2.0 - 3.0     Assessment & Plan:   Derrall was seen today for follow-up.  Diagnoses and all orders for this visit:  Long term (current) use of anticoagulants -     POCT INR -     CoaguChek XS/INR Waived   Continue current dose of coumadin.   I am having [name] maintain his Osteo Bi-Flex Adv Triple St, aspirin, triamcinolone, nitroGLYCERIN, OneTouch Delica Lancets 40J, OneTouch Verio, warfarin, metoprolol, glipiZIDE, sitaGLIPtin, rosuvastatin, pantoprazole, clopidogrel, insulin glargine, isosorbide mononitrate, metFORMIN, furosemide, and Entresto.  Allergies as of 07/28/2020      Reactions   Doxycycline Hives      Medication List       Accurate as of July 28, 2020 11:59 PM. If you have any questions, ask your nurse or doctor.        aspirin 81 MG chewable tablet Chew 81 mg by mouth daily.   clopidogrel 75 MG tablet Commonly known as: PLAVIX Take 1 tablet (75 mg total) by mouth daily with breakfast.   Entresto 49-51 MG Generic drug: sacubitril-valsartan Take 1 tablet by mouth 2 (two) times daily. What changed: Another medication with the same name was removed. Continue taking this medication, and follow the directions you see here. Changed by: Claretta Fraise, MD   furosemide 20 MG tablet Commonly known as: LASIX Take 1 tablet (20 mg total) by mouth daily.   glipiZIDE 10 MG 24 hr tablet Commonly known as: GLUCOTROL XL Take 1 tablet (10 mg total) by mouth daily.   insulin glargine 100 UNIT/ML injection Commonly known as: LANTUS Inject 0.7 mLs (70 Units total) into the skin at bedtime. What changed:   when to take  this  additional instructions   isosorbide mononitrate 30 MG 24 hr tablet Commonly known as: IMDUR Take 1 tablet (30 mg total) by mouth daily.   metFORMIN 500 MG tablet Commonly known as: GLUCOPHAGE Take 1 tablet (500 mg total) by mouth daily with breakfast.   metoprolol 200 MG 24 hr tablet Commonly known as: TOPROL-XL Take 1 tablet (200 mg total) by mouth daily after breakfast.   nitroGLYCERIN 0.4 MG SL tablet Commonly known as: NITROSTAT Place 1 tablet (0.4 mg total) under the tongue every 5 (five) minutes as needed for chest pain.   OneTouch Delica Lancets 81X Misc 1 each by Other route 2 (two) times daily. Dx E11.9   OneTouch Verio test strip Generic drug: glucose blood USE TO TEST TWICE A DAY   Osteo Bi-Flex Adv Triple St Tabs Take 1 tablet by mouth 2 (two) times a week.   pantoprazole 40 MG tablet Commonly known as: PROTONIX Take 1 tablet (40 mg total) by mouth daily.   rosuvastatin 20 MG tablet Commonly known  as: CRESTOR Take 1 tablet (20 mg total) by mouth daily.   sitaGLIPtin 50 MG tablet Commonly known as: Januvia Take 1 tablet (50 mg total) by mouth daily.   triamcinolone 0.1 % Commonly known as: KENALOG Apply 1 application topically 2 (two) times daily. Avoid face and genitalia   warfarin 5 MG tablet Commonly known as: COUMADIN Take as directed by the anticoagulation clinic. If you are unsure how to take this medication, talk to your nurse or doctor. Original instructions: Take 1 tablet (5 mg total) by mouth daily.        Follow-up: Return in about 6 days (around 08/03/2020) for inr.  Claretta Fraise, M.D.

## 2020-07-29 DIAGNOSIS — Z79899 Other long term (current) drug therapy: Secondary | ICD-10-CM | POA: Diagnosis not present

## 2020-07-29 LAB — BASIC METABOLIC PANEL
BUN/Creatinine Ratio: 36 — ABNORMAL HIGH (ref 10–24)
BUN: 64 mg/dL — ABNORMAL HIGH (ref 8–27)
CO2: 20 mmol/L (ref 20–29)
Calcium: 9.1 mg/dL (ref 8.6–10.2)
Chloride: 105 mmol/L (ref 96–106)
Creatinine, Ser: 1.8 mg/dL — ABNORMAL HIGH (ref 0.76–1.27)
GFR calc Af Amer: 44 mL/min/{1.73_m2} — ABNORMAL LOW (ref >59)
GFR calc non Af Amer: 38 mL/min/{1.73_m2} — ABNORMAL LOW (ref >59)
Glucose: 158 mg/dL — ABNORMAL HIGH (ref 65–99)
Potassium: 5.3 mmol/L — ABNORMAL HIGH (ref 3.5–5.2)
Sodium: 139 mmol/L (ref 134–144)

## 2020-07-31 ENCOUNTER — Encounter: Payer: Self-pay | Admitting: Family Medicine

## 2020-08-01 ENCOUNTER — Telehealth: Payer: Self-pay | Admitting: *Deleted

## 2020-08-01 ENCOUNTER — Encounter: Payer: Self-pay | Admitting: Cardiology

## 2020-08-01 NOTE — Telephone Encounter (Signed)
Spoke with pt, aware of lab results and recommendations. He reports he has a virtual visit with Kerin Ransom pa tomorrow and wants to discuss at the appointment.

## 2020-08-01 NOTE — Telephone Encounter (Signed)
Patient is calling back for results.

## 2020-08-01 NOTE — Telephone Encounter (Signed)
-----   Message from [place], PA-C sent at 08/01/2020  1:23 PM EST ----- Please call patient and inform him lasb cam back showing elevated potassium and relatively stable creatinine. With elevated K stop Entresto. Confirm he is not on any other potassium supplements (appears he is not). Also decreased potassium intake.   Since stopping Entresto please continue to log daily Bps  BMET in 1 week. Also monitor weights daily.   If SBP >130 might need to add hydralazine

## 2020-08-01 NOTE — Telephone Encounter (Signed)
Left message for pt to call.

## 2020-08-01 NOTE — Telephone Encounter (Signed)
This encounter was created in error - please disregard.

## 2020-08-02 ENCOUNTER — Encounter: Payer: Self-pay | Admitting: Physician Assistant

## 2020-08-02 ENCOUNTER — Telehealth: Payer: Self-pay

## 2020-08-02 ENCOUNTER — Telehealth (INDEPENDENT_AMBULATORY_CARE_PROVIDER_SITE_OTHER): Payer: PPO | Admitting: Physician Assistant

## 2020-08-02 VITALS — BP 107/60 | Ht 71.0 in | Wt 213.0 lb

## 2020-08-02 DIAGNOSIS — E785 Hyperlipidemia, unspecified: Secondary | ICD-10-CM | POA: Diagnosis not present

## 2020-08-02 DIAGNOSIS — Z952 Presence of prosthetic heart valve: Secondary | ICD-10-CM

## 2020-08-02 DIAGNOSIS — I482 Chronic atrial fibrillation, unspecified: Secondary | ICD-10-CM | POA: Diagnosis not present

## 2020-08-02 DIAGNOSIS — Z95810 Presence of automatic (implantable) cardiac defibrillator: Secondary | ICD-10-CM | POA: Diagnosis not present

## 2020-08-02 DIAGNOSIS — I10 Essential (primary) hypertension: Secondary | ICD-10-CM

## 2020-08-02 DIAGNOSIS — I25810 Atherosclerosis of coronary artery bypass graft(s) without angina pectoris: Secondary | ICD-10-CM

## 2020-08-02 DIAGNOSIS — I5042 Chronic combined systolic (congestive) and diastolic (congestive) heart failure: Secondary | ICD-10-CM

## 2020-08-02 MED ORDER — ENTRESTO 24-26 MG PO TABS: 1.0000 | ORAL_TABLET | Freq: Two times a day (BID) | ORAL | 3 refills | Status: DC

## 2020-08-02 NOTE — Progress Notes (Signed)
Virtual Visit via Telephone Note   This visit type was conducted due to national recommendations for restrictions regarding the COVID-19 Pandemic (e.g. social distancing) in an effort to limit this patient's exposure and mitigate transmission in our community.  Due to his co-morbid illnesses, this patient is at least at moderate risk for complications without adequate follow up.  This format is felt to be most appropriate for this patient at this time.  The patient did not have access to video technology/had technical difficulties with video requiring transitioning to audio format only (telephone).  All issues noted in this document were discussed and addressed.  No physical exam could be performed with this format.  Please refer to the patient's chart for his  consent to telehealth for [facility].    Date:  08/04/2020   ID:  John [name], DOB 1951/07/03, MRN [id number] The patient was identified using 2 identifiers.  Patient Location: Home Provider Location: Home Office  PCP:  John Fraise, MD  Cardiologist:  John Breeding, MD  Electrophysiologist:  John Peru, MD   Evaluation Performed:  Follow-Up Visit  Chief Complaint:  Follow up  History of Present Illness:    John [name] is a 69 y.o. male with PMH of CAD s/p CABG, chronic combined systolic and diastolic heart failure with baseline EF 25 to 30%, history of mechanical mitral valve replacement secondary to rheumatic heart disease, history of ICD placement, hypertension, chronic A. fib and hyperlipidemia. Patient was admitted for unstable angina in October 2021.  Cardiac catheterization performed on 06/23/2020 revealed 85% ostial left circumflex lesion, 100% mid LAD occlusion, 30% proximal RCA lesion, 80% proximal to distal RCA lesion, 100% occlusion in SVG to distal RCA, patent LIMA to LAD, 70% proximal to mid LAD lesion.  Left circumflex lesion was treated with a Resolute Onyx 2.75 x 26 mm DES on the following day with  staged PCI.  Postprocedure, he was started on aspirin and Plavix.  Imdur discontinued due to hypotension.  He was also started on Entresto.  Hospitalization complicated by acute on chronic renal insufficiency with creatinine trended up to 4.75.  This was felt to be related to contrast nephropathy on top of the [facility] ([facility]).  Nephrotoxic agents were held.  Creatinine on discharge was 2.44.  Patient was last seen by John Ransom, PA-C on 07/12/2020 at which time he complained of exertional chest discomfort.  Imdur was restarted at half dose for 3 days then go back to full dose of 30 mg daily thereafter.  The plan was also to check the renal function and if renal function improved, plan to restart Lasix at 20 mg daily.  Repeat blood work 3 weeks ago shows improved renal function with a creatinine of 1.57, potassium was elevated at 5.6.  Entresto was reduced to the 49-51 mg twice daily dosing.  Patient later called cardiology service on 07/22/2020 at which time, he had signs of volume overload. Lasix was restarted.  Lab work came back on 07/29/2028 showed rising creatinine of 1.8.  Elevated potassium of 5.3.    Patient presents today for virtual visit.  Based on the rising creatinine and elevated potassium recently, I recommended reducing his Entresto further down to 24-26 mg twice a day.  Overall, he has been doing well and heart failure symptom has completely resolved after he started on the Lasix.  His weight has also came down as well.  He is planning to see his nephrologist John [name] on November 30.  I  will defer repeat lab work to nephrology service.  He can follow-up with John [name] as previously scheduled in 3 weeks.  The patient does not have symptoms concerning for COVID-19 infection (fever, chills, cough, or new shortness of breath).    Past Medical History:  Diagnosis Date  . Allergy   . Anemia    "lost blood w/the cancer"  . Arthritis    back   . Atrial fibrillation ([place])    takes Coumadin  daily  . Bruises easily    takes Coumadin daily  . CAD (coronary artery disease)    a. s/p CABG 11/2000 (L-LAD, S-RCA);  b. Lexiscan Myoview 11/12: EF 45%, ischemia and possibly some scar at the base of the inferolateral wall;   c. Lex MV 11/13:  EF 44%, Inf and IL scar/soft tissue atten, small mild amt of inf ischemia,  . CHF (congestive heart failure) ([place])   . Constipation    takes Colace daily  . Enlarged prostate   . Flu 09/2013  . GERD (gastroesophageal reflux disease)    takes Nexium daily  . Glaucoma   . History of blood transfusion    "related to cancer"  . History of colon polyps   . HLD (hyperlipidemia)    takes Vytorin daily  . HTN (hypertension)    takes Amlodipine,Metoprolol, and Ramipril daily  . Insomnia    states he has always been like this.But doesn't take any meds  . Kidney stones    "I passed them all"  . Nocturia   . Peripheral edema    takes Furosemide daily  . Peripheral neuropathy   . Rheumatic fever ~ 1965  . Rheumatic heart disease    a. s/p mechanical (St. Jude) MVR 2002;  b. Echo 5/11: Mild LVH, EF 45-50%, mild AS, mild AI, mean gradient 11 mmHg, MVR with normal gradients, moderate LAE, mild RAE;  c.  Echo 11/13:   mod LVH, EF 55-60%, mild AS (mean 18 mmHg), mild AI, severe LAE, mild RAE, PASP 41  . 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 SOB (shortness of breath)    with exertion  . Type II diabetes mellitus ([place])    takes Januvia,Glipizide,and Metformin daily as well as Lantus   Past Surgical History:  Procedure Laterality Date  . APPLICATION OF WOUND VAC  2014  . CARDIAC CATHETERIZATION  2002  . COLONOSCOPY    . CORONARY ARTERY BYPASS GRAFT  2002   x 3  . CORONARY ARTERY BYPASS GRAFT    . CORONARY ATHERECTOMY N/A 06/24/2020   Procedure: CORONARY ATHERECTOMY;  Surgeon: [name], MD;  Location: [place] [facility];  Service: Cardiovascular;  Laterality: N/A;  . CORONARY BALLOON ANGIOPLASTY N/A  06/24/2020   Procedure: CORONARY BALLOON ANGIOPLASTY;  Surgeon: [name], MD;  Location: [place] [facility];  Service: Cardiovascular;  Laterality: N/A;  . CORONARY STENT INTERVENTION N/A 06/24/2020   Procedure: CORONARY STENT INTERVENTION;  Surgeon: [name], MD;  Location: [place] [facility];  Service: Cardiovascular;  Laterality: N/A;  . ESOPHAGOGASTRODUODENOSCOPY    . HERNIA REPAIR    . ICD IMPLANT N/A 02/19/2020   Procedure: ICD IMPLANT;  Surgeon: Evans Lance, MD;  Location: [place] [facility];  Service: Cardiovascular;  Laterality: N/A;  . INCISIONAL HERNIA REPAIR N/A 03/15/2014   Procedure: LAPAROSCOPIC INCISIONAL HERNIA;  Surgeon: Harl Bowie, MD;  Location: [place];  Service: General;  Laterality: N/A;  . INSERTION  OF MESH N/A 03/15/2014   Procedure: INSERTION OF MESH;  Surgeon: Harl Bowie, MD;  Location: [place];  Service: General;  Laterality: N/A;  . LAPAROSCOPIC INCISIONAL / UMBILICAL / [place]  03/15/2014   IHR  . LAPAROTOMY N/A 02/09/2013   Procedure: EXPLORATORY LAPAROTOMY WITH incisional biopsy intra- ABDOMINAL MASS ILOCECTOMY ;  Surgeon: Harl Bowie, MD;  Location: [facility];  Service: General;  Laterality: N/A;  . MITRAL VALVE REPLACEMENT  11/2000   #33 St. Jude mechanical mitral valve prosthesis. Dr. Servando Snare  . PORT-A-CATH REMOVAL  03/15/2014  . PORT-A-CATH REMOVAL Left 03/15/2014   Procedure: REMOVAL PORT-A-CATH;  Surgeon: Harl Bowie, MD;  Location: [place];  Service: General;  Laterality: Left;  . PORTACATH PLACEMENT N/A 02/09/2013   Procedure: INSERTION PORT-A-CATH;  Surgeon: Harl Bowie, MD;  Location: [facility];  Service: General;  Laterality: N/A;  . RIGHT/LEFT HEART CATH AND CORONARY/GRAFT ANGIOGRAPHY N/A 05/10/2017   Procedure: RIGHT/LEFT HEART CATH AND CORONARY/GRAFT ANGIOGRAPHY;  Surgeon: Martinique, Peter M, MD;  Location: [place] [facility];  Service: Cardiovascular;  Laterality: N/A;  . RIGHT/LEFT HEART CATH AND  CORONARY/GRAFT ANGIOGRAPHY N/A 06/23/2020   Procedure: RIGHT/LEFT HEART CATH AND CORONARY/GRAFT ANGIOGRAPHY;  Surgeon: Troy Sine, MD;  Location: [place] [facility];  Service: Cardiovascular;  Laterality: N/A;  . SEPTOPLASTY    . SMALL INTESTINE SURGERY       Current Meds  Medication Sig  . aspirin 81 MG chewable tablet Chew 81 mg by mouth daily.  . clopidogrel (PLAVIX) 75 MG tablet Take 1 tablet (75 mg total) by mouth daily with breakfast.  . furosemide (LASIX) 20 MG tablet Take 1 tablet (20 mg total) by mouth daily.  Marland Kitchen glipiZIDE (GLUCOTROL XL) 10 MG 24 hr tablet Take 1 tablet (10 mg total) by mouth daily.  Marland Kitchen glucose blood (ONETOUCH VERIO) test strip USE TO TEST TWICE A DAY  . insulin glargine (LANTUS) 100 UNIT/ML injection Inject 0.7 mLs (70 Units total) into the skin at bedtime. (Patient taking differently: Inject 70 Units into the skin. Take 70 units if over 180 at bedtime)  . isosorbide mononitrate (IMDUR) 30 MG 24 hr tablet Take 1 tablet (30 mg total) by mouth daily.  . metFORMIN (GLUCOPHAGE) 500 MG tablet Take 1 tablet (500 mg total) by mouth daily with breakfast.  . metoprolol (TOPROL-XL) 200 MG 24 hr tablet Take 1 tablet (200 mg total) by mouth daily after breakfast.  . Misc Natural Products (OSTEO BI-FLEX ADV TRIPLE ST) TABS Take 1 tablet by mouth 2 (two) times a week.   . nitroGLYCERIN (NITROSTAT) 0.4 MG SL tablet Place 1 tablet (0.4 mg total) under the tongue every 5 (five) minutes as needed for chest pain.  Glory Rosebush Delica Lancets 64P MISC 1 each by Other route 2 (two) times daily. Dx E11.9  . pantoprazole (PROTONIX) 40 MG tablet Take 1 tablet (40 mg total) by mouth daily.  . rosuvastatin (CRESTOR) 20 MG tablet Take 1 tablet (20 mg total) by mouth daily.  . sitaGLIPtin (JANUVIA) 50 MG tablet Take 1 tablet (50 mg total) by mouth daily.  Marland Kitchen triamcinolone cream (KENALOG) 0.1 % Apply 1 application topically 2 (two) times daily. Avoid face and genitalia  . warfarin (COUMADIN) 5  MG tablet Take 1 tablet (5 mg total) by mouth daily.  . [DISCONTINUED] sacubitril-valsartan (ENTRESTO) 49-51 MG Take 0.5 tablets by mouth 2 (two) times daily.      Allergies:   Doxycycline   Social  History   Tobacco Use  . Smoking status: Former Smoker    Years: 40.00    Types: Cigars  . Smokeless tobacco: Never Used  Vaping Use  . Vaping Use: Never used  Substance Use Topics  . Alcohol use: No    Comment: "drank for 30 years; quit ~ 2000"  . Drug use: No    Types: Marijuana    Comment: quit in 2000 'reefer'     Family Hx: The patient's family history includes Arthritis in his brother; Depression in his brother; Diabetes in his brother, brother, and mother; Heart disease in his brother and brother; Hyperlipidemia in his brother and brother; Hypertension in his brother, brother, and father; Thyroid disease in his sister.  ROS:   Please see the history of present illness.     All other systems reviewed and are negative.   Prior CV studies:   The following studies were reviewed today:  Cath 06/23/2020  Ost Cx lesion is 85% stenosed.  Mid LAD lesion is 100% stenosed.  Prox RCA lesion is 30% stenosed.  Prox RCA to Dist RCA lesion is 80% stenosed.  Origin lesion is 100% stenosed.  Prox LAD to Mid LAD lesion is 70% stenosed.   Severe native CAD with diffuse 7075% proximal LAD stenoses prior to total occlusion of the mid LAD; 80 to 90% ostial circumflex stenosis proximal to a high marginal/ramus intermediate length vessel; 30% proximal RCA stenosis with diffuse 80% mid to distal stenoses.  There is collateralization to the PLA vessel from the left coronary circulation.  Patent LIMA to mid LAD.  Old occlusion of the vein graft which previously supplied the distal RCA.  Mild right heart pressure elevation.  Mean PA pressure 22 mm.  Very mild aortic stenosis with a peak to peak gradient of 10 and mean gradient of 7.5 mmHg; AVA 2.4 cm  RECOMMENDATION: Angiograms  will be reviewed with colleagues.  The LIMA to LAD is widely patent.  The vein graft to the RCA is occluded and there is previously noted diffuse RCA disease.  The PLA vessel is well collateralized from the left coronary circulation.  Will heparinize post procedure.  Consider PCI of the circumflex ostium in this patient with a protected LAD circulation.    Cath 06/24/2020 Conclusions: 1. Severe, calcified disease involving the distal LMCA extending into the ostial and proximal LCx, as seen on yesterday's diagnostic coronary angiogram. 2. Successful PCI using orbital atherectomy and drug-eluting stent placement in the distal LMCA into the proximal LCx using a Resolute Onyx 2.75 x 26 mm drug-eluting stent.  Plaque shift into the ramus intermedius necessitated kissing balloon inflation.  Final angiogram demonstrates 0% residual stenosis with TIMI-3 flow. 3. Calcified and stenotic left radial artery precluding advancement of a 53F slender glide sheath.  Recommend alternative access for future catheterizations.  Recommendations: 1. Restart heparin infusion 4 hours after femoral sheath removal if no evidence of bleeding complications.  Restart warfarin tonight, with heparin or enoxaparin bridging until INR therapeutic. 2. Continue triple therapy with aspirin, clopidogrel, and warfarin for 1 month, after which time aspirin can be discontinued.  12 months of therapy with warfarin and clopidogrel is recommended, if tolerated from a bleeding standpoint. 3. Aggressive secondary prevention.    Labs/Other Tests and Data Reviewed:    EKG:  An ECG dated 06/25/2020 was personally reviewed today and demonstrated:  Atrial fibrillation with right bundle branch block  Recent Labs: 05/19/2020: ALT 14 06/20/2020: B Natriuretic Peptide 2,291.0 07/01/2020: Hemoglobin 10.1;  Platelets 204 07/29/2020: BUN 64; Creatinine, Ser 1.80; Potassium 5.3; Sodium 139   Recent Lipid Panel Lab Results  Component Value Date/Time    CHOL 130 05/19/2020 08:11 AM   TRIG 116 05/19/2020 08:11 AM   HDL 27 (L) 05/19/2020 08:11 AM   CHOLHDL 4.8 05/19/2020 08:11 AM   LDLCALC 82 05/19/2020 08:11 AM   LDLDIRECT 88 09/23/2017 02:19 PM    Wt Readings from Last 3 Encounters:  08/03/20 208 lb 2 oz (94.4 kg)  08/02/20 213 lb (96.6 kg)  07/28/20 212 lb 12.8 oz (96.5 kg)     Risk Assessment/Calculations:     CHA2DS2-VASc Score = 5  This indicates a 7.2% annual risk of stroke. The patient's score is based upon: CHF History: 1 HTN History: 1 Diabetes History: 1 Stroke History: 0 Vascular Disease History: 1 Age Score: 1 Gender Score: 0     Objective:    Vital Signs:  BP 107/60   Ht 5\' 11"  (1.803 m)   Wt 213 lb (96.6 kg)   BMI 29.71 kg/m    VITAL SIGNS:  reviewed  ASSESSMENT & PLAN:    1. CAD s/p CABG: Recently underwent PCI.  Continue aspirin and Plavix  2. Chronic combined systolic and diastolic heart failure: He had heart failure symptom after diuretic was held due to acute kidney injury, after kidney injury improved, Lasix was restarted and heart failure symptom has resolved.  [name] is reduced to 24-26 mg twice daily due to recent rise in creatinine and a potassium level.  Repeat blood work by nephrology service  3. Mechanical mitral valve: Stable on previous echocardiogram  4. History of ICD: Followed by electrophysiology service  5. Chronic atrial fibrillation: On Coumadin.  Rate controlled  6. Hypertension: Blood pressure borderline, will reduce Entresto to 24-26 mg twice daily.  7. Hyperlipidemia: On Crestor.   Shared Decision Making/Informed Consent        COVID-19 Education: The signs and symptoms of COVID-19 were discussed with the patient and how to seek care for testing (follow up with PCP or arrange E-visit).  The importance of social distancing was discussed today.  Time:   Today, I have spent 10 minutes with the patient with telehealth technology discussing the above problems.      Medication Adjustments/Labs and Tests Ordered: Current medicines are reviewed at length with the patient today.  Concerns regarding medicines are outlined above.   Tests Ordered: No orders of the defined types were placed in this encounter.   Medication Changes: Meds ordered this encounter  Medications  . sacubitril-valsartan (ENTRESTO) 24-26 MG    Sig: Take 1 tablet by mouth 2 (two) times daily.    Dispense:  180 tablet    Refill:  3    Follow Up:  In Person in 2 month(s)  Signed, Almyra Deforest, [place]  08/04/2020 11:37 PM    [facility]

## 2020-08-02 NOTE — Telephone Encounter (Signed)
  Patient Consent for Virtual Visit         John [name] has provided verbal consent on 08/02/2020 for a virtual visit (video or telephone).   CONSENT FOR VIRTUAL VISIT FOR:  John [name]  By participating in this virtual visit I agree to the following:  I hereby voluntarily request, consent and authorize [facility] and its employed or contracted physicians, Engineer, materials, nurse practitioners or other licensed health care professionals (the Practitioner), to provide me with telemedicine health care services (the "Services") as deemed necessary by the treating Practitioner. I acknowledge and consent to receive the Services by the Practitioner via telemedicine. I understand that the telemedicine visit will involve communicating with the Practitioner through live audiovisual communication technology and the disclosure of certain medical information by electronic transmission. I acknowledge that I have been given the opportunity to request an in-person assessment or other available alternative prior to the telemedicine visit and am voluntarily participating in the telemedicine visit.  I understand that I have the right to withhold or withdraw my consent to the use of telemedicine in the course of my care at any time, without affecting my right to future care or treatment, and that the Practitioner or I may terminate the telemedicine visit at any time. I understand that I have the right to inspect all information obtained and/or recorded in the course of the telemedicine visit and may receive copies of available information for a reasonable fee.  I understand that some of the potential risks of receiving the Services via telemedicine include:  Marland Kitchen Delay or interruption in medical evaluation due to technological equipment failure or disruption; . Information transmitted may not be sufficient (e.g. poor resolution of images) to allow for appropriate medical decision making by the  Practitioner; and/or  . In rare instances, security protocols could fail, causing a breach of personal health information.  Furthermore, I acknowledge that it is my responsibility to provide information about my medical history, conditions and care that is complete and accurate to the best of my ability. I acknowledge that Practitioner's advice, recommendations, and/or decision may be based on factors not within their control, such as incomplete or inaccurate data provided by me or distortions of diagnostic images or specimens that may result from electronic transmissions. I understand that the practice of medicine is not an exact science and that Practitioner makes no warranties or guarantees regarding treatment outcomes. I acknowledge that a copy of this consent can be made available to me via my patient portal ([place]), or I can request a printed copy by calling the office of [facility].    I understand that my insurance will be billed for this visit.   I have read or had this consent read to me. . I understand the contents of this consent, which adequately explains the benefits and risks of the Services being provided via telemedicine.  . I have been provided ample opportunity to ask questions regarding this consent and the Services and have had my questions answered to my satisfaction. . I give my informed consent for the services to be provided through the use of telemedicine in my medical care

## 2020-08-02 NOTE — Patient Instructions (Addendum)
Medication Instructions:   DECREASE Entresto to 24 mg-26 mg 2 times a day *If you need a refill on your cardiac medications before your next appointment, please call your pharmacy*  Lab Work: Defer all labs to Nephrologist (Kid[facility] Doctor)   If you have labs (blood work) drawn today and your tests are completely normal, you will receive your results only by: Marland Kitchen MyChart Message (if you have MyChart) OR . A paper copy in the mail If you have any lab test that is abnormal or we need to change your treatment, we will call you to review the results.  Testing/Procedures: NONE ordered at this time of appointment   Follow-Up: At [facility], you and your health needs are our priority.  As part of our continuing mission to provide you with exceptional heart care, we have created designated Provider Care Teams.  These Care Teams include your primary Cardiologist (physician) and Advanced Practice Providers (APPs -  Physician Assistants and Nurse Practitioners) who all work together to provide you with the care you need, when you need it.  Your next appointment:   Follow up as scheduled-08/24/20 at 8:40 AM  The format for your next appointment:   In Person  Provider:   Minus Breeding, MD  Other Instructions

## 2020-08-03 ENCOUNTER — Ambulatory Visit (INDEPENDENT_AMBULATORY_CARE_PROVIDER_SITE_OTHER): Payer: PPO | Admitting: Family Medicine

## 2020-08-03 ENCOUNTER — Other Ambulatory Visit: Payer: Self-pay

## 2020-08-03 ENCOUNTER — Encounter: Payer: Self-pay | Admitting: Family Medicine

## 2020-08-03 VITALS — BP 107/56 | HR 77 | Temp 96.7°F | Resp 20 | Ht 71.0 in | Wt 208.1 lb

## 2020-08-03 DIAGNOSIS — Z7901 Long term (current) use of anticoagulants: Secondary | ICD-10-CM | POA: Diagnosis not present

## 2020-08-03 DIAGNOSIS — I482 Chronic atrial fibrillation, unspecified: Secondary | ICD-10-CM

## 2020-08-03 DIAGNOSIS — Z95810 Presence of automatic (implantable) cardiac defibrillator: Secondary | ICD-10-CM | POA: Diagnosis not present

## 2020-08-03 DIAGNOSIS — Z9889 Other specified postprocedural states: Secondary | ICD-10-CM

## 2020-08-03 LAB — COAGUCHEK XS/INR WAIVED
INR: 3.7 — ABNORMAL HIGH (ref 0.9–1.1)
Prothrombin Time: 44.8 s

## 2020-08-03 NOTE — Progress Notes (Signed)
Subjective:  Patient ID: NIKOLOS BILLIG, male    DOB: [birth year]  Age: 69 y.o. MRN: [id number]  CC: Coagulation Disorder   HPI Ralf L Camp presents for Patient in for follow-up of atrial fibrillation. Patient denies any recent bouts of chest pain or palpitations. Additionally, patient is taking anticoagulants. Patient denies any recent excessive bleeding episodes including epistaxis, bleeding from the gums, genitalia, rectal bleeding or hematuria. Additionally there has been no excessive bruising.  Patient also has an artificial mitral valve.  Depression screen [facility] 2/9 08/03/2020 07/28/2020 07/21/2020  Decreased Interest 0 0 0  Down, Depressed, Hopeless 0 0 0  PHQ - 2 Score 0 0 0  Altered sleeping - - -  Tired, decreased energy - - -  Change in appetite - - -  Feeling bad or failure about yourself  - - -  Trouble concentrating - - -  Moving slowly or fidgety/restless - - -  Suicidal thoughts - - -  PHQ-9 Score - - -  Some recent data might be hidden    History Desten has a past medical history of Allergy, Anemia, Arthritis, Atrial fibrillation ([place]), Bruises easily, CAD (coronary artery disease), CHF (congestive heart failure) ([place]), Constipation, Enlarged prostate, Flu (09/2013), GERD (gastroesophageal reflux disease), Glaucoma, History of blood transfusion, History of colon polyps, HLD (hyperlipidemia), HTN (hypertension), Insomnia, Kidney stones, Nocturia, Peripheral edema, Peripheral neuropathy, Rheumatic fever (~ 1965), Rheumatic heart disease, S/P MVR (mitral valve replacement) (11/2000), 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   He has a past surgical history that includes Mitral valve replacement (11/2000); laparotomy (N/A, 02/09/2013); Portacath placement (N/A, 02/09/2013); Coronary artery bypass graft (2002); Septoplasty; Application if wound vac (2014); Colonoscopy; Esophagogastroduodenoscopy; Laparoscopic incisional /  umbilical / ventral hernia repair (03/15/2014); Port-a-cath removal (03/15/2014); Hernia repair; Cardiac catheterization (2002); Incisional hernia repair (N/A, 03/15/2014); Port-a-cath removal (Left, 03/15/2014); Insertion of mesh (N/A, 03/15/2014); RIGHT/LEFT HEART CATH AND CORONARY/GRAFT ANGIOGRAPHY (N/A, 05/10/2017); Coronary artery bypass graft; Small intestine surgery; ICD IMPLANT (N/A, 02/19/2020); RIGHT/LEFT HEART CATH AND CORONARY/GRAFT ANGIOGRAPHY (N/A, 06/23/2020); CORONARY STENT INTERVENTION (N/A, 06/24/2020); CORONARY ATHERECTOMY (N/A, 06/24/2020); and CORONARY BALLOON ANGIOPLASTY (N/A, 06/24/2020).   His family history includes Arthritis in his brother; Depression in his brother; Diabetes in his brother, brother, and mother; Heart disease in his brother and brother; Hyperlipidemia in his brother and brother; Hypertension in his brother, brother, and father; Thyroid disease in his sister.He reports that he has quit smoking. His smoking use included cigars. He quit after 40.00 years of use. He has never used smokeless tobacco. He reports that he does not drink alcohol and does not use drugs.    ROS Review of Systems  Constitutional: Negative for fever.  Respiratory: Negative for shortness of breath.   Cardiovascular: Negative for chest pain.  Musculoskeletal: Negative for arthralgias.  Skin: Negative for rash.  Hematological: Does not bruise/bleed easily.    Objective:  BP (!) 107/56    Pulse 77    Temp (!) 96.7 F (35.9 C) (Temporal)    Resp 20    Ht 5\' 11"  (1.803 m)    Wt 208 lb 2 oz (94.4 kg)    SpO2 99%    BMI 29.03 kg/m   BP Readings from Last 3 Encounters:  08/03/20 (!) 107/56  08/02/20 107/60  07/28/20 119/67    Wt Readings from Last 3 Encounters:  08/03/20 208 lb 2 oz (94.4 kg)  08/02/20 213 lb (96.6 kg)  07/28/20 212  lb 12.8 oz (96.5 kg)     Physical Exam Vitals reviewed.  Constitutional:      Appearance: He is well-developed.  HENT:     Head: Normocephalic and  atraumatic.     Right Ear: External ear normal.     Left Ear: External ear normal.     Mouth/Throat:     Pharynx: No oropharyngeal exudate or posterior oropharyngeal erythema.  Eyes:     Pupils: Pupils are equal, round, and reactive to light.  Cardiovascular:     Rate and Rhythm: Normal rate. Rhythm irregular.     Heart sounds: Murmur heard.  No gallop.   Pulmonary:     Effort: No respiratory distress.     Breath sounds: Normal breath sounds.  Musculoskeletal:     Cervical back: Normal range of motion and neck supple.  Neurological:     Mental Status: He is alert and oriented to person, place, and time.     Results for orders placed or performed in visit on 08/03/20  CoaguChek XS/INR Waived  Result Value Ref Range   INR 3.7 (H) 0.9 - 1.1   Prothrombin Time 44.8 sec     Assessment & Plan:   Brier was seen today for coagulation disorder.  Diagnoses and all orders for this visit:  Chronic atrial fibrillation ([place]) -     CoaguChek XS/INR Waived  Long term (current) use of anticoagulants  MITRAL VALVE REPLACEMENT, HX OF  ICD (implantable cardioverter-defibrillator) in place   Patient instructed to take Coumadin 5 mg 1/2 tablet today and tomorrow then 1 pill on the third day.  He should continue a 3-day rotation 1/2-1/2 hold etc.  We will follow up in a week. Allergies as of 08/03/2020      Reactions   Doxycycline Hives      Medication List       Accurate as of August 03, 2020 11:59 PM. If you have any questions, ask your nurse or doctor.        aspirin 81 MG chewable tablet Chew 81 mg by mouth daily.   clopidogrel 75 MG tablet Commonly known as: PLAVIX Take 1 tablet (75 mg total) by mouth daily with breakfast.   Entresto 24-26 MG Generic drug: sacubitril-valsartan Take 1 tablet by mouth 2 (two) times daily.   furosemide 20 MG tablet Commonly known as: LASIX Take 1 tablet (20 mg total) by mouth daily.   glipiZIDE 10 MG 24 hr tablet Commonly known  as: GLUCOTROL XL Take 1 tablet (10 mg total) by mouth daily.   insulin glargine 100 UNIT/ML injection Commonly known as: LANTUS Inject 0.7 mLs (70 Units total) into the skin at bedtime. What changed:   when to take this  additional instructions   isosorbide mononitrate 30 MG 24 hr tablet Commonly known as: IMDUR Take 1 tablet (30 mg total) by mouth daily.   metFORMIN 500 MG tablet Commonly known as: GLUCOPHAGE Take 1 tablet (500 mg total) by mouth daily with breakfast.   metoprolol 200 MG 24 hr tablet Commonly known as: TOPROL-XL Take 1 tablet (200 mg total) by mouth daily after breakfast.   nitroGLYCERIN 0.4 MG SL tablet Commonly known as: NITROSTAT Place 1 tablet (0.4 mg total) under the tongue every 5 (five) minutes as needed for chest pain.   OneTouch Delica Lancets 93T Misc 1 each by Other route 2 (two) times daily. Dx E11.9   OneTouch Verio test strip Generic drug: glucose blood USE TO TEST TWICE A DAY  Osteo Bi-Flex Adv Triple St Tabs Take 1 tablet by mouth 2 (two) times a week.   pantoprazole 40 MG tablet Commonly known as: PROTONIX Take 1 tablet (40 mg total) by mouth daily.   rosuvastatin 20 MG tablet Commonly known as: CRESTOR Take 1 tablet (20 mg total) by mouth daily.   sitaGLIPtin 50 MG tablet Commonly known as: Januvia Take 1 tablet (50 mg total) by mouth daily.   triamcinolone 0.1 % Commonly known as: KENALOG Apply 1 application topically 2 (two) times daily. Avoid face and genitalia   warfarin 5 MG tablet Commonly known as: COUMADIN Take as directed by the anticoagulation clinic. If you are unsure how to take this medication, talk to your nurse or doctor. Original instructions: Take 1 tablet (5 mg total) by mouth daily.        Follow-up: Return in about 9 days (around 08/12/2020) for INR.  Claretta Fraise, M.D.

## 2020-08-04 IMAGING — DX DG FOOT COMPLETE 3+V*R*
3 series · 3 of 3 positions shown · non-contrast
Comparison: None.

CLINICAL DATA: Right foot pain for 1 week

EXAM:
RIGHT FOOT COMPLETE - 3+ VIEW

[foot ap]
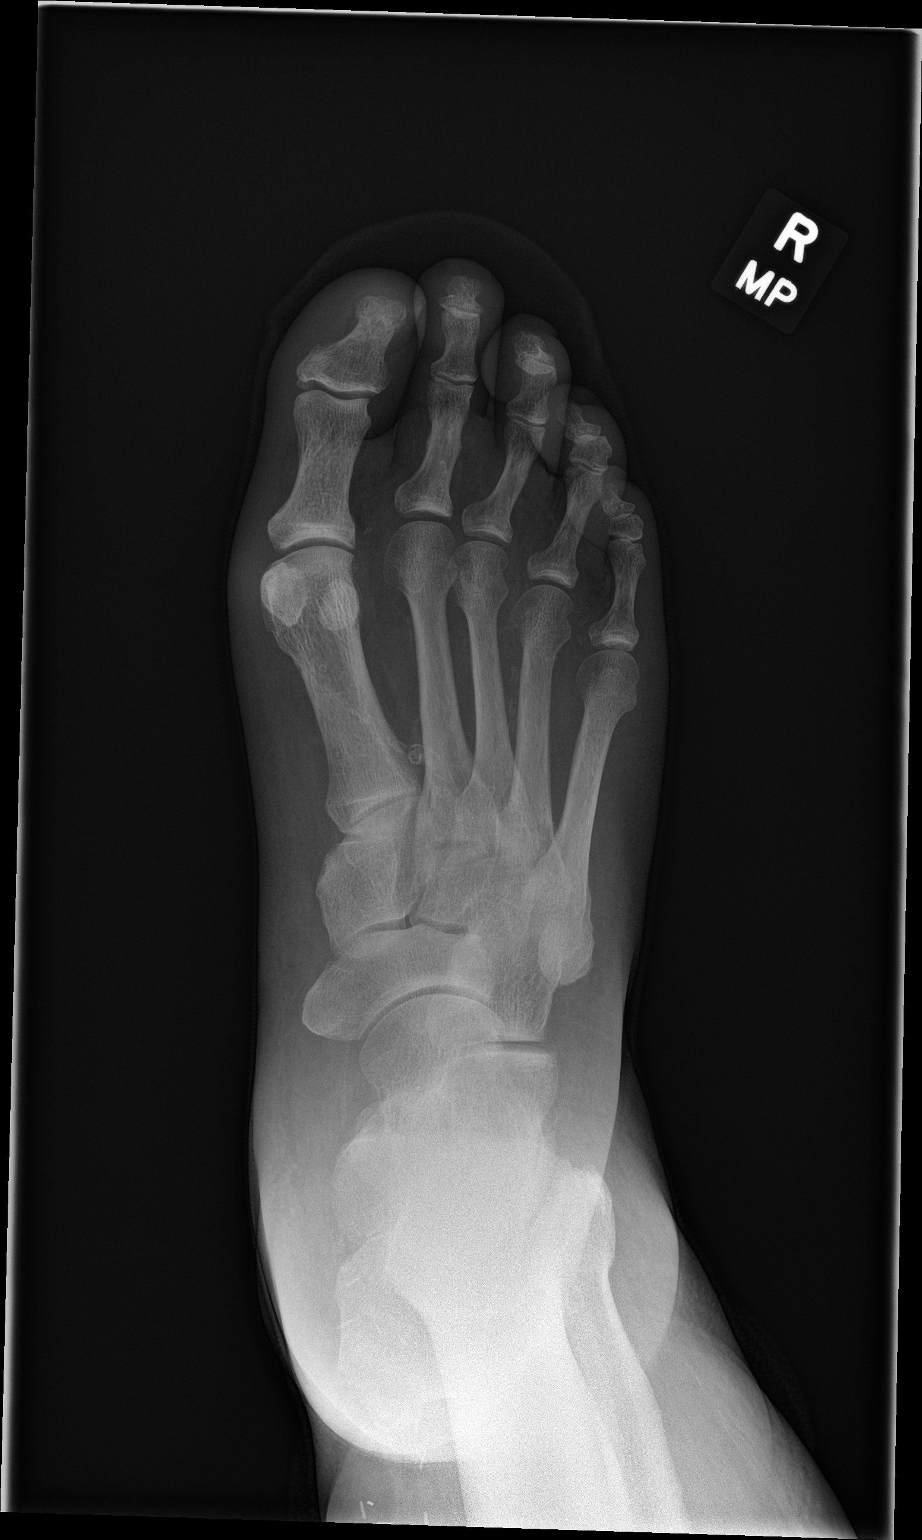

[foot obl]
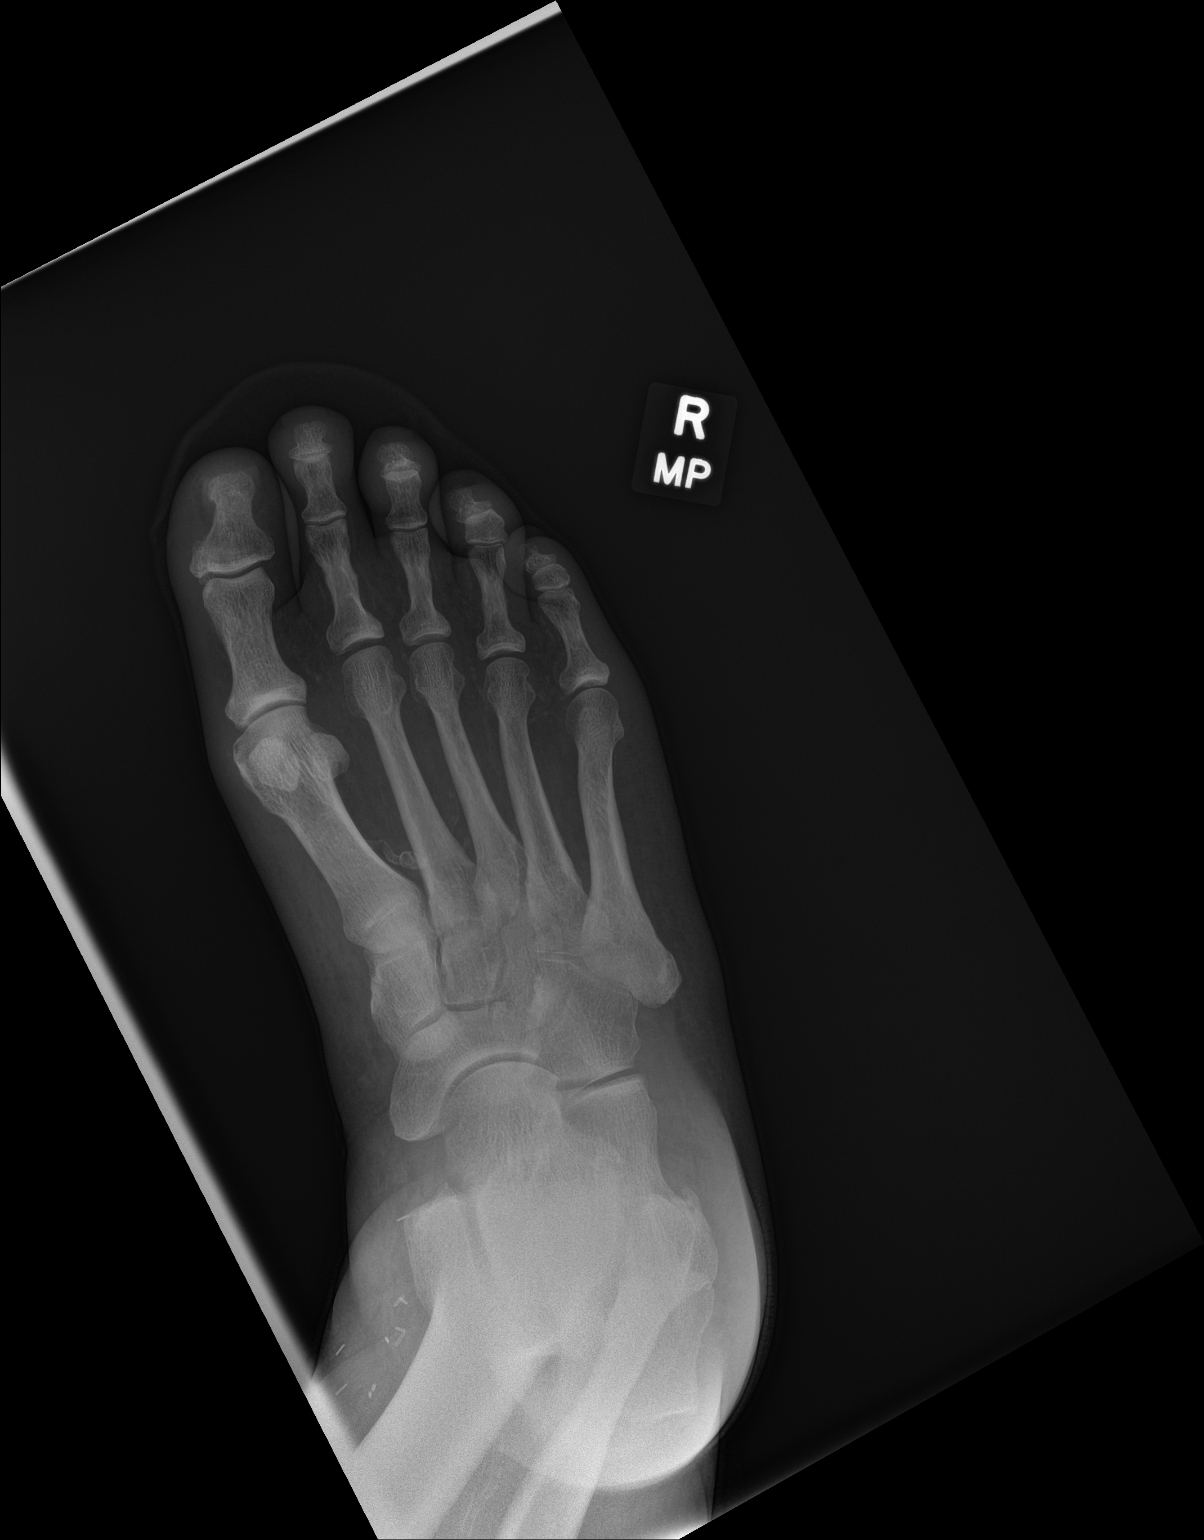

[foot lat]
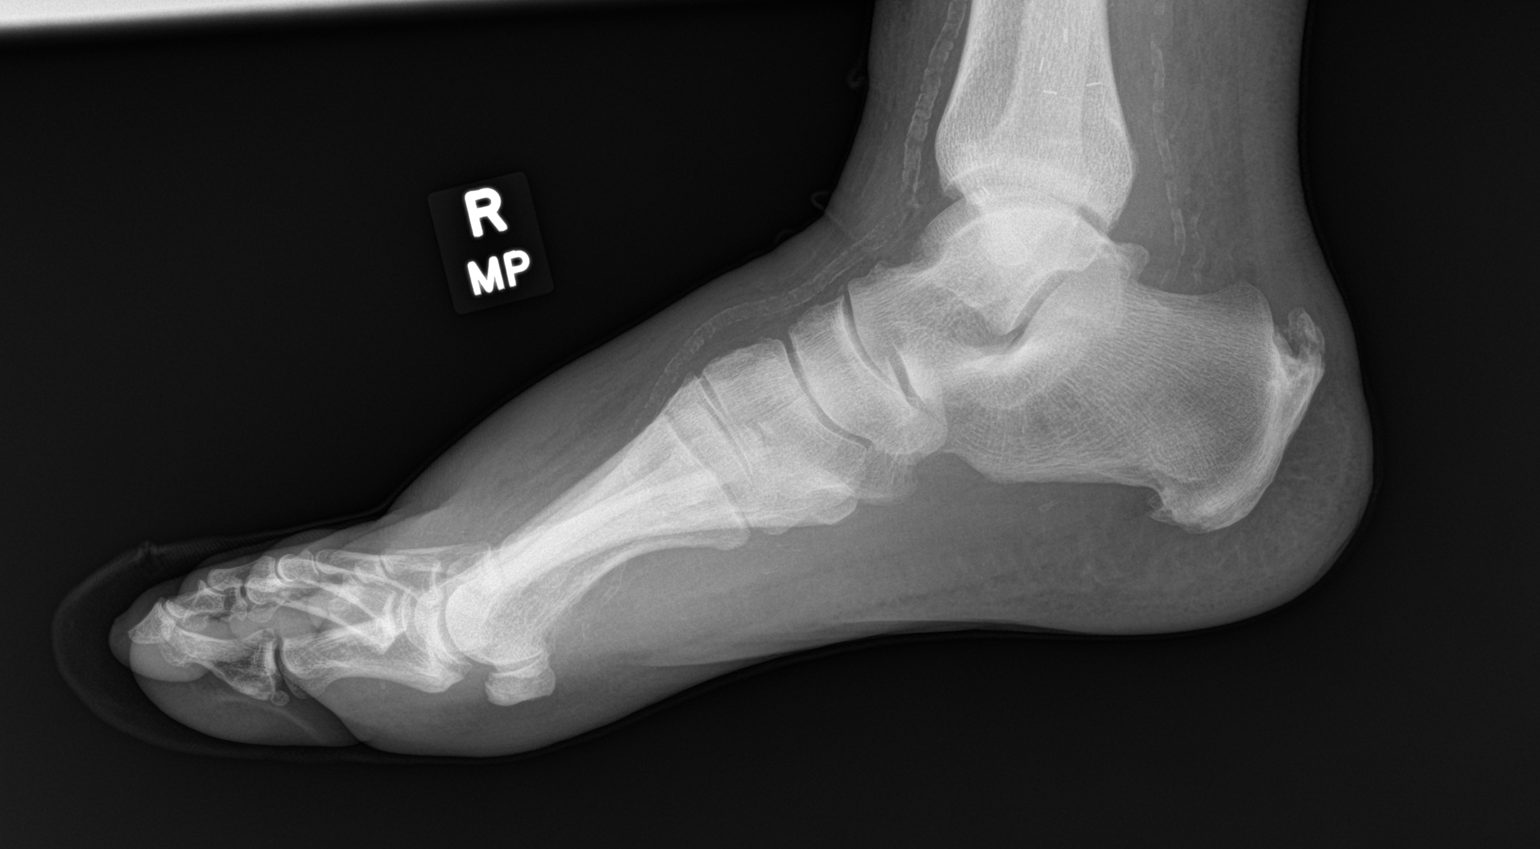

[3 of 3 positions shown; findings below may reference images not displayed]

FINDINGS: Calcaneal spurring is noted. No acute fracture or dislocation is not
seen. Mild soft tissue swelling is noted distally.
IMPRESSION: Mild soft tissue swelling.  No acute bony abnormality is seen.

## 2020-08-07 ENCOUNTER — Encounter: Payer: Self-pay | Admitting: Family Medicine

## 2020-08-07 ENCOUNTER — Other Ambulatory Visit: Payer: Self-pay | Admitting: Family Medicine

## 2020-08-07 DIAGNOSIS — I255 Ischemic cardiomyopathy: Secondary | ICD-10-CM

## 2020-08-08 ENCOUNTER — Ambulatory Visit: Payer: PPO | Admitting: Family Medicine

## 2020-08-09 DIAGNOSIS — D631 Anemia in chronic kidney disease: Secondary | ICD-10-CM | POA: Diagnosis not present

## 2020-08-09 DIAGNOSIS — I129 Hypertensive chronic kidney disease with stage 1 through stage 4 chronic kidney disease, or unspecified chronic kidney disease: Secondary | ICD-10-CM | POA: Diagnosis not present

## 2020-08-09 DIAGNOSIS — N179 Acute kidney failure, unspecified: Secondary | ICD-10-CM | POA: Diagnosis not present

## 2020-08-09 DIAGNOSIS — N183 Chronic kidney disease, stage 3 unspecified: Secondary | ICD-10-CM | POA: Diagnosis not present

## 2020-08-09 DIAGNOSIS — R809 Proteinuria, unspecified: Secondary | ICD-10-CM | POA: Diagnosis not present

## 2020-08-09 DIAGNOSIS — Z7689 Persons encountering health services in other specified circumstances: Secondary | ICD-10-CM | POA: Diagnosis not present

## 2020-08-09 DIAGNOSIS — E875 Hyperkalemia: Secondary | ICD-10-CM | POA: Diagnosis not present

## 2020-08-12 ENCOUNTER — Other Ambulatory Visit: Payer: Self-pay

## 2020-08-12 ENCOUNTER — Ambulatory Visit (INDEPENDENT_AMBULATORY_CARE_PROVIDER_SITE_OTHER): Payer: PPO | Admitting: Family Medicine

## 2020-08-12 ENCOUNTER — Encounter: Payer: Self-pay | Admitting: Family Medicine

## 2020-08-12 VITALS — BP 109/60 | HR 65 | Temp 97.1°F | Ht 71.0 in | Wt 212.0 lb

## 2020-08-12 DIAGNOSIS — Z7901 Long term (current) use of anticoagulants: Secondary | ICD-10-CM

## 2020-08-12 DIAGNOSIS — I482 Chronic atrial fibrillation, unspecified: Secondary | ICD-10-CM | POA: Diagnosis not present

## 2020-08-12 LAB — COAGUCHEK XS/INR WAIVED
INR: 2.5 — ABNORMAL HIGH (ref 0.9–1.1)
Prothrombin Time: 30.5 s

## 2020-08-12 NOTE — Progress Notes (Signed)
Subjective:  Patient ID: John [name], male    DOB: 1951-01-14  Age: 69 y.o. MRN: [id number]  CC: Chronic Afib (1 week INR)   HPI John [name] presents for Atrial fibrillation follow up. Pt. is treated with rate control and anticoagulation. Pt.  denies palpitations, rapid rate, chest pain, dyspnea and edema. There has been no bleeding from nose or gums. Pt. has not noticed blood with urine or stool.  Although there is routine bruising easily, it is not excessive. Goal is 2.5-3.5 due to mitral valve, mechanical, replaced.  Depression screen [facility] 2/9 08/12/2020 08/03/2020 07/28/2020  Decreased Interest 0 0 0  Down, Depressed, Hopeless 0 0 0  PHQ - 2 Score 0 0 0  Altered sleeping - - -  Tired, decreased energy - - -  Change in appetite - - -  Feeling bad or failure about yourself  - - -  Trouble concentrating - - -  Moving slowly or fidgety/restless - - -  Suicidal thoughts - - -  PHQ-9 Score - - -  Some recent data might be hidden    History John [name] has a past medical history of Allergy, Anemia, Arthritis, Atrial fibrillation ([place]), Bruises easily, CAD (coronary artery disease), CHF (congestive heart failure) ([place]), Constipation, Enlarged prostate, Flu (09/2013), GERD (gastroesophageal reflux disease), Glaucoma, History of blood transfusion, History of colon polyps, HLD (hyperlipidemia), HTN (hypertension), Insomnia, Kidney stones, Nocturia, Peripheral edema, Peripheral neuropathy, Rheumatic fever (~ 1965), Rheumatic heart disease, S/P MVR (mitral valve replacement) (11/2000), 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   He has a past surgical history that includes Mitral valve replacement (11/2000); laparotomy (N/A, 02/09/2013); Portacath placement (N/A, 02/09/2013); Coronary artery bypass graft (2002); Septoplasty; Application if wound vac (2014); Colonoscopy; Esophagogastroduodenoscopy; Laparoscopic incisional / umbilical / ventral  hernia repair (03/15/2014); Port-a-cath removal (03/15/2014); Hernia repair; Cardiac catheterization (2002); Incisional hernia repair (N/A, 03/15/2014); Port-a-cath removal (Left, 03/15/2014); Insertion of mesh (N/A, 03/15/2014); RIGHT/LEFT HEART CATH AND CORONARY/GRAFT ANGIOGRAPHY (N/A, 05/10/2017); Coronary artery bypass graft; Small intestine surgery; ICD IMPLANT (N/A, 02/19/2020); RIGHT/LEFT HEART CATH AND CORONARY/GRAFT ANGIOGRAPHY (N/A, 06/23/2020); CORONARY STENT INTERVENTION (N/A, 06/24/2020); CORONARY ATHERECTOMY (N/A, 06/24/2020); and CORONARY BALLOON ANGIOPLASTY (N/A, 06/24/2020).   His family history includes Arthritis in his brother; Depression in his brother; Diabetes in his brother, brother, and mother; Heart disease in his brother and brother; Hyperlipidemia in his brother and brother; Hypertension in his brother, brother, and father; Thyroid disease in his sister.He reports that he has quit smoking. His smoking use included cigars. He quit after 40.00 years of use. He has never used smokeless tobacco. He reports that he does not drink alcohol and does not use drugs.    ROS Review of Systems  Constitutional: Positive for fatigue (baseline). Negative for activity change and appetite change.  Respiratory: Negative for shortness of breath.     Objective:  BP 109/60    Pulse 65    Temp (!) 97.1 F (36.2 C) (Temporal)    Ht 5\' 11"  (1.803 m)    Wt 212 lb (96.2 kg)    BMI 29.57 kg/m   BP Readings from Last 3 Encounters:  08/12/20 109/60  08/03/20 (!) 107/56  08/02/20 107/60    Wt Readings from Last 3 Encounters:  08/12/20 212 lb (96.2 kg)  08/03/20 208 lb 2 oz (94.4 kg)  08/02/20 213 lb (96.6 kg)     Physical Exam Vitals reviewed.  Constitutional:  Appearance: He is well-developed.  HENT:     Head: Normocephalic and atraumatic.  Eyes:     Conjunctiva/sclera: Conjunctivae normal.  Cardiovascular:     Rate and Rhythm: Normal rate. Rhythm irregular.     Heart sounds: Normal  heart sounds.  Pulmonary:     Effort: No respiratory distress.  Musculoskeletal:     Cervical back: Normal range of motion and neck supple.  Neurological:     Mental Status: He is alert and oriented to person, place, and time.       Assessment & Plan:   John [name] was seen today for chronic afib.  Diagnoses and all orders for this visit:  Chronic atrial fibrillation ([place]) -     CoaguChek XS/INR Waived  Long term (current) use of anticoagulants -     CoaguChek XS/INR Waived       I am having John [name] maintain his Osteo Bi-Flex Adv Triple St, aspirin, triamcinolone, nitroGLYCERIN, OneTouch Delica Lancets 45W, OneTouch Verio, warfarin, metoprolol, glipiZIDE, sitaGLIPtin, rosuvastatin, pantoprazole, clopidogrel, insulin glargine, isosorbide mononitrate, metFORMIN, furosemide, and Entresto.  Allergies as of 08/12/2020      Reactions   Doxycycline Hives      Medication List       Accurate as of August 12, 2020  6:41 PM. If you have any questions, ask your nurse or doctor.        aspirin 81 MG chewable tablet Chew 81 mg by mouth daily.   clopidogrel 75 MG tablet Commonly known as: PLAVIX Take 1 tablet (75 mg total) by mouth daily with breakfast.   Entresto 24-26 MG Generic drug: sacubitril-valsartan Take 1 tablet by mouth 2 (two) times daily.   furosemide 20 MG tablet Commonly known as: LASIX Take 1 tablet (20 mg total) by mouth daily.   glipiZIDE 10 MG 24 hr tablet Commonly known as: GLUCOTROL XL Take 1 tablet (10 mg total) by mouth daily.   insulin glargine 100 UNIT/ML injection Commonly known as: LANTUS Inject 0.7 mLs (70 Units total) into the skin at bedtime. What changed:   when to take this  additional instructions   isosorbide mononitrate 30 MG 24 hr tablet Commonly known as: IMDUR Take 1 tablet (30 mg total) by mouth daily.   metFORMIN 500 MG tablet Commonly known as: GLUCOPHAGE Take 1 tablet (500 mg total) by mouth daily with  breakfast.   metoprolol 200 MG 24 hr tablet Commonly known as: TOPROL-XL Take 1 tablet (200 mg total) by mouth daily after breakfast.   nitroGLYCERIN 0.4 MG SL tablet Commonly known as: NITROSTAT Place 1 tablet (0.4 mg total) under the tongue every 5 (five) minutes as needed for chest pain.   OneTouch Delica Lancets 09W Misc 1 each by Other route 2 (two) times daily. Dx E11.9   OneTouch Verio test strip Generic drug: glucose blood USE TO TEST TWICE A DAY   Osteo Bi-Flex Adv Triple St Tabs Take 1 tablet by mouth 2 (two) times a week.   pantoprazole 40 MG tablet Commonly known as: PROTONIX Take 1 tablet (40 mg total) by mouth daily.   rosuvastatin 20 MG tablet Commonly known as: CRESTOR Take 1 tablet (20 mg total) by mouth daily.   sitaGLIPtin 50 MG tablet Commonly known as: Januvia Take 1 tablet (50 mg total) by mouth daily.   triamcinolone 0.1 % Commonly known as: KENALOG Apply 1 application topically 2 (two) times daily. Avoid face and genitalia   warfarin 5 MG tablet Commonly known as: COUMADIN Take  as directed by the anticoagulation clinic. If you are unsure how to take this medication, talk to your nurse or doctor. Original instructions: Take 1 tablet (5 mg total) by mouth daily.        Follow-up: Return in about 1 week (around 08/19/2020) for INR.  Claretta Fraise, M.D.

## 2020-08-18 ENCOUNTER — Encounter: Payer: Self-pay | Admitting: Family Medicine

## 2020-08-18 ENCOUNTER — Ambulatory Visit (INDEPENDENT_AMBULATORY_CARE_PROVIDER_SITE_OTHER): Payer: PPO | Admitting: Family Medicine

## 2020-08-18 ENCOUNTER — Other Ambulatory Visit: Payer: Self-pay

## 2020-08-18 VITALS — BP 120/67 | HR 71 | Temp 97.3°F | Ht 71.0 in | Wt 209.0 lb

## 2020-08-18 DIAGNOSIS — Z9889 Other specified postprocedural states: Secondary | ICD-10-CM | POA: Diagnosis not present

## 2020-08-18 DIAGNOSIS — I482 Chronic atrial fibrillation, unspecified: Secondary | ICD-10-CM | POA: Diagnosis not present

## 2020-08-18 LAB — COAGUCHEK XS/INR WAIVED
INR: 2.6 — ABNORMAL HIGH (ref 0.9–1.1)
Prothrombin Time: 30.7 s

## 2020-08-19 ENCOUNTER — Ambulatory Visit (INDEPENDENT_AMBULATORY_CARE_PROVIDER_SITE_OTHER): Payer: PPO

## 2020-08-19 DIAGNOSIS — I255 Ischemic cardiomyopathy: Secondary | ICD-10-CM

## 2020-08-20 LAB — CUP PACEART REMOTE DEVICE CHECK
Battery Remaining Longevity: 104 mo
Battery Remaining Percentage: 93 %
Battery Voltage: 3.01 V
Brady Statistic RV Percent Paced: 1.4 %
Date Time Interrogation Session: 20211210010220
HighPow Impedance: 62 Ohm
Implantable Lead Implant Date: 20210611
Implantable Lead Location: 753860
Implantable Pulse Generator Implant Date: 20210611
Lead Channel Impedance Value: 490 Ohm
Lead Channel Pacing Threshold Amplitude: 0.75 V
Lead Channel Pacing Threshold Pulse Width: 0.5 ms
Lead Channel Sensing Intrinsic Amplitude: 12 mV
Lead Channel Setting Pacing Amplitude: 2 V
Lead Channel Setting Pacing Pulse Width: 0.5 ms
Lead Channel Setting Sensing Sensitivity: 0.5 mV
Pulse Gen Serial Number: 810002163

## 2020-08-21 ENCOUNTER — Encounter: Payer: Self-pay | Admitting: Family Medicine

## 2020-08-21 NOTE — Progress Notes (Signed)
Chief Complaint  Patient presents with  . Anticoagulation    HPI  Patient presents today for Atrial fibrillation and mechanical heart valve follow up. Pt. is treated with rate control and anticoagulation. Pt.  denies palpitations, rapid rate, chest pain, dyspnea and edema. There has been no bleeding from nose or gums. Pt. has not noticed blood with urine or stool.  Although there is routine bruising easily, it is not excessive.   PMH: Smoking status noted ROS: Per HPI  Objective: BP 120/67   Pulse 71   Temp (!) 97.3 F (36.3 C) (Temporal)   Ht 5\' 11"  (1.803 m)   Wt 209 lb (94.8 kg)   BMI 29.15 kg/m  Gen: NAD, alert, cooperative with exam HEENT: NCAT, EOMI, PERRL CV: RRR, good S1/S2, no murmur Resp: CTABL, no wheezes, non-labored Abd: SNTND, BS present, no guarding or organomegaly Ext: No edema, warm Neuro: Alert and oriented, No gross deficits  Assessment and plan:  1. Chronic atrial fibrillation ([place])   2. MITRAL VALVE REPLACEMENT, HX OF     Continue coumadin as is.   Orders Placed This Encounter  Procedures  . CoaguChek XS/INR Waived    Follow up as needed.  Claretta Fraise, MD

## 2020-08-22 NOTE — Progress Notes (Signed)
Cardiology Office Note   Date:  08/24/2020   ID:  John [name], DOB September 27, 1950, MRN [id number]  PCP:  Claretta Fraise, MD  Cardiologist:   Minus Breeding, MD   No chief complaint on file.     History of Present Illness: John [name] is a 69 y.o. male who presents for follow of MVR and atrial fib.  In April 2018 an echo demonstrated a stable MVR and stable mild aortic valve disease.  However, the EF was moderately reduced.  His EF at the previous echo was 50 although it had been reduced in the more distant past.  It was now 30 - 35% in Oct of last year.  I sent him for a Lexiscan Myoview in 2018.  He had a reduced EF with a new anterior wall perfusion defect that was not present on the previous perfusion study.  He underwent cardiac cath.  He had an occluded SVG to the RCA but flow through the native vessel.  The LAD was occluded but with good flow through the LIMA.  The patient was admitted for unstable angina in October 2021.  Cardiac catheterization performed on 06/23/2020 revealed 85% ostial left circumflex lesion, 100% mid LAD occlusion, 30% proximal RCA lesion, 80% proximal to distal RCA lesion, 100% occlusion in SVG to distal RCA, patent LIMA to LAD, 70% proximal to mid LAD lesion.  Th left circumflex lesion was treated with a Resolute Onyx 2.75 x 26 mm DES on the following day with staged PCI.  Postprocedure, he was started on aspirin and Plavix.  Imdur was discontinued due to hypotension.  He was also started on Entresto.  His Imdur has been held and restarted with caution secondary to hypotension.  [name] has been held and then restarted at a lower dose secondary to CKD.    Since he was seen he has done well.  He has seen nephrology and says that his potassium and renal function are stable though I do not have the most recent labs.  He is walking the dogs.  He plays pool. The patient denies any new symptoms such as chest discomfort, neck or arm discomfort. There has been no  new shortness of breath, PND or orthopnea. There have been no reported palpitations, presyncope or syncope.    Past Medical History:  Diagnosis Date  . Allergy   . Anemia    "lost blood w/the cancer"  . Arthritis    back   . Atrial fibrillation ([place])    takes Coumadin daily  . Bruises easily    takes Coumadin daily  . CAD (coronary artery disease)    a. s/p CABG 11/2000 (L-LAD, S-RCA);  b. Lexiscan Myoview 11/12: EF 45%, ischemia and possibly some scar at the base of the inferolateral wall;   c. Lex MV 11/13:  EF 44%, Inf and IL scar/soft tissue atten, small mild amt of inf ischemia,  . CHF (congestive heart failure) ([place])   . Constipation    takes Colace daily  . Enlarged prostate   . Flu 09/2013  . GERD (gastroesophageal reflux disease)    takes Nexium daily  . Glaucoma   . History of blood transfusion    "related to cancer"  . History of colon polyps   . HLD (hyperlipidemia)    takes Vytorin daily  . HTN (hypertension)    takes Amlodipine,Metoprolol, and Ramipril daily  . Insomnia    states he has always been like this.But doesn't take any meds  .  Kidney stones    "I passed them all"  . Nocturia   . Peripheral edema    takes Furosemide daily  . Peripheral neuropathy   . Rheumatic fever ~ 1965  . Rheumatic heart disease    a. s/p mechanical (St. Jude) MVR 2002;  b. Echo 5/11: Mild LVH, EF 45-50%, mild AS, mild AI, mean gradient 11 mmHg, MVR with normal gradients, moderate LAE, mild RAE;  c.  Echo 11/13:   mod LVH, EF 55-60%, mild AS (mean 18 mmHg), mild AI, severe LAE, mild RAE, PASP 41  . 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 SOB (shortness of breath)    with exertion  . Type II diabetes mellitus ([place])    takes Januvia,Glipizide,and Metformin daily as well as Lantus    Past Surgical History:  Procedure Laterality Date  . APPLICATION OF WOUND VAC  2014  . CARDIAC CATHETERIZATION  2002  . COLONOSCOPY    . CORONARY ARTERY  BYPASS GRAFT  2002   x 3  . CORONARY ARTERY BYPASS GRAFT    . CORONARY ATHERECTOMY N/A 06/24/2020   Procedure: CORONARY ATHERECTOMY;  Surgeon: [name], MD;  Location: [facility];  Service: Cardiovascular;  Laterality: N/A;  . CORONARY BALLOON ANGIOPLASTY N/A 06/24/2020   Procedure: CORONARY BALLOON ANGIOPLASTY;  Surgeon: [name], MD;  Location: [facility];  Service: Cardiovascular;  Laterality: N/A;  . CORONARY STENT INTERVENTION N/A 06/24/2020   Procedure: CORONARY STENT INTERVENTION;  Surgeon: [name], MD;  Location: [facility];  Service: Cardiovascular;  Laterality: N/A;  . ESOPHAGOGASTRODUODENOSCOPY    . HERNIA REPAIR    . ICD IMPLANT N/A 02/19/2020   Procedure: ICD IMPLANT;  Surgeon: Evans Lance, MD;  Location: [facility];  Service: Cardiovascular;  Laterality: N/A;  . INCISIONAL HERNIA REPAIR N/A 03/15/2014   Procedure: LAPAROSCOPIC INCISIONAL HERNIA;  Surgeon: Harl Bowie, MD;  Location: [facility];  Service: General;  Laterality: N/A;  . INSERTION OF MESH N/A 03/15/2014   Procedure: INSERTION OF MESH;  Surgeon: Harl Bowie, MD;  Location: [facility];  Service: General;  Laterality: N/A;  . LAPAROSCOPIC INCISIONAL / UMBILICAL / [place]  03/15/2014   IHR  . LAPAROTOMY N/A 02/09/2013   Procedure: EXPLORATORY LAPAROTOMY WITH incisional biopsy intra- ABDOMINAL MASS ILOCECTOMY ;  Surgeon: Harl Bowie, MD;  Location: [facility];  Service: General;  Laterality: N/A;  . MITRAL VALVE REPLACEMENT  11/2000   #33 St. Jude mechanical mitral valve prosthesis. Dr. Servando Snare  . PORT-A-CATH REMOVAL  03/15/2014  . PORT-A-CATH REMOVAL Left 03/15/2014   Procedure: REMOVAL PORT-A-CATH;  Surgeon: Harl Bowie, MD;  Location: [facility];  Service: General;  Laterality: Left;  . PORTACATH PLACEMENT N/A 02/09/2013   Procedure: INSERTION PORT-A-CATH;  Surgeon: Harl Bowie, MD;  Location: [facility];  Service: General;  Laterality: N/A;   . RIGHT/LEFT HEART CATH AND CORONARY/GRAFT ANGIOGRAPHY N/A 05/10/2017   Procedure: RIGHT/LEFT HEART CATH AND CORONARY/GRAFT ANGIOGRAPHY;  Surgeon: Martinique, Peter M, MD;  Location: [facility];  Service: Cardiovascular;  Laterality: N/A;  . RIGHT/LEFT HEART CATH AND CORONARY/GRAFT ANGIOGRAPHY N/A 06/23/2020   Procedure: RIGHT/LEFT HEART CATH AND CORONARY/GRAFT ANGIOGRAPHY;  Surgeon: Troy Sine, MD;  Location: [facility];  Service: Cardiovascular;  Laterality: N/A;  . SEPTOPLASTY    . SMALL INTESTINE SURGERY       Current Outpatient Medications  Medication  Sig Dispense Refill  . clopidogrel (PLAVIX) 75 MG tablet Take 1 tablet (75 mg total) by mouth daily with breakfast. 90 tablet 3  . furosemide (LASIX) 20 MG tablet Take 1 tablet (20 mg total) by mouth daily. 30 tablet 2  . glipiZIDE (GLUCOTROL XL) 10 MG 24 hr tablet Take 1 tablet (10 mg total) by mouth daily. 90 tablet 1  . glucose blood (ONETOUCH VERIO) test strip USE TO TEST TWICE A DAY 200 strip 3  . insulin glargine (LANTUS) 100 UNIT/ML injection Inject 0.7 mLs (70 Units total) into the skin at bedtime. (Patient taking differently: Inject 70 Units into the skin. Take 70 units if over 180 at bedtime) 10 mL 11  . isosorbide mononitrate (IMDUR) 30 MG 24 hr tablet Take 1 tablet (30 mg total) by mouth daily. 90 tablet 3  . metFORMIN (GLUCOPHAGE) 500 MG tablet Take 1 tablet (500 mg total) by mouth daily with breakfast. 90 tablet 1  . metoprolol (TOPROL-XL) 200 MG 24 hr tablet Take 1 tablet (200 mg total) by mouth daily after breakfast. 90 tablet 0  . nitroGLYCERIN (NITROSTAT) 0.4 MG SL tablet Place 1 tablet (0.4 mg total) under the tongue every 5 (five) minutes as needed for chest pain. 50 tablet 10  . OneTouch Delica Lancets 39J MISC 1 each by Other route 2 (two) times daily. Dx E11.9 200 each 3  . pantoprazole (PROTONIX) 40 MG tablet Take 1 tablet (40 mg total) by mouth daily. 90 tablet 3  . rosuvastatin (CRESTOR) 20 MG  tablet Take 1 tablet (20 mg total) by mouth daily. 60 tablet 4  . sacubitril-valsartan (ENTRESTO) 24-26 MG Take 1 tablet by mouth 2 (two) times daily. 180 tablet 3  . sitaGLIPtin (JANUVIA) 50 MG tablet Take 1 tablet (50 mg total) by mouth daily. 90 tablet 0  . warfarin (COUMADIN) 5 MG tablet Take 1 tablet (5 mg total) by mouth daily. 90 tablet 1   No current facility-administered medications for this visit.    Allergies:   Doxycycline    ROS:  Please see the history of present illness.   Otherwise, review of systems are positive for none.   All other systems are reviewed and negative.    PHYSICAL EXAM: VS:  BP 122/71   Pulse 73   Temp (!) 94.6 F (34.8 C)   Ht 5\' 11"  (1.803 m)   Wt 208 lb 9.6 oz (94.6 kg)   SpO2 99%   BMI 29.09 kg/m  , BMI Body mass index is 29.09 kg/m. GENERAL:  Well appearing NECK:  No jugular venous distention, waveform within normal limits, carotid upstroke brisk and symmetric, no bruits, no thyromegaly LUNGS:  Clear to auscultation bilaterally CHEST:  Well healed sternotomy scar.    Well healed ICD scar.   HEART:  PMI not displaced or sustained,S1 and S2 within normal limits, no S3, no S4, no clicks, no rubs, no murmurs ABD:  Flat, positive bowel sounds normal in frequency in pitch, no bruits, no rebound, no guarding, no midline pulsatile mass, no hepatomegaly, no splenomegaly EXT:  2 plus pulses throughout, no edema, no cyanosis no clubbing   EKG:  EKG is not ordered today. NA   Recent Labs: 05/19/2020: ALT 14 06/20/2020: B Natriuretic Peptide 2,291.0 07/01/2020: Hemoglobin 10.1; Platelets 204 07/29/2020: BUN 64; Creatinine, Ser 1.80; Potassium 5.3; Sodium 139    Lipid Panel    Component Value Date/Time   CHOL 130 05/19/2020 0811   TRIG 116 05/19/2020 0811   HDL  27 (L) 05/19/2020 0811   CHOLHDL 4.8 05/19/2020 0811   LDLCALC 82 05/19/2020 0811   LDLDIRECT 88 09/23/2017 1419      Wt Readings from Last 3 Encounters:  08/24/20 208 lb 9.6 oz  (94.6 kg)  08/18/20 209 lb (94.8 kg)  08/12/20 212 lb (96.2 kg)      Other studies Reviewed: Additional studies/ records that were reviewed today include: Hospital records. Review of the above records demonstrates:  Please see elsewhere in the note.     ASSESSMENT AND PLAN:  CAD s/p CABG:   The patient has no new sypmtoms.  No further cardiovascular testing is indicated.  We will continue with aggressive risk reduction and meds as listed.  He can stop his ASA.   Chronic combined systolic and diastolic heart failure:   He seems to be euvolemic.  We have had to titrate his Entresto down because of hypotension so I am not going to try to go up further.  No change in therapy.   Mechanical mitral valve: He understands endocarditis prophylaxis.   History of ICD:    He is up-to-date with follow-up.   Chronic atrial fibrillation: He tolerates anticoagulation.  He is up-to-date with follow-up.   Hypertension:   His blood pressure is low but tolerating the current dose of Entresto.  No change in therapy.  Hyperlipidemia: On Crestor.  His LDL is very slightly elevated.  Consider going up on this after repeating lipid profile following the next appointment.  We talked about diet.  He seems to be pretty good.  He should move closer to a plant-based diet.   Current medicines are reviewed at length with the patient today.  The patient does not have concerns regarding medicines.  The following changes have been made:  no change  Labs/ tests ordered today include: None No orders of the defined types were placed in this encounter.    Disposition:   FU with me in 4 months in [place].     Signed, Minus Breeding, MD  08/24/2020 9:06 AM    [place]

## 2020-08-23 DIAGNOSIS — I5023 Acute on chronic systolic (congestive) heart failure: Secondary | ICD-10-CM | POA: Insufficient documentation

## 2020-08-23 DIAGNOSIS — I519 Heart disease, unspecified: Secondary | ICD-10-CM | POA: Insufficient documentation

## 2020-08-24 ENCOUNTER — Ambulatory Visit: Payer: PPO | Admitting: Cardiology

## 2020-08-24 ENCOUNTER — Encounter: Payer: Self-pay | Admitting: Cardiology

## 2020-08-24 ENCOUNTER — Other Ambulatory Visit: Payer: Self-pay

## 2020-08-24 VITALS — BP 122/71 | HR 73 | Temp 94.6°F | Ht 71.0 in | Wt 208.6 lb

## 2020-08-24 DIAGNOSIS — I519 Heart disease, unspecified: Secondary | ICD-10-CM

## 2020-08-24 DIAGNOSIS — Z952 Presence of prosthetic heart valve: Secondary | ICD-10-CM | POA: Diagnosis not present

## 2020-08-24 DIAGNOSIS — I10 Essential (primary) hypertension: Secondary | ICD-10-CM | POA: Diagnosis not present

## 2020-08-24 DIAGNOSIS — I25119 Atherosclerotic heart disease of native coronary artery with unspecified angina pectoris: Secondary | ICD-10-CM | POA: Diagnosis not present

## 2020-08-24 NOTE — Patient Instructions (Signed)
Medication Instructions:  STOP ASPIRIN *If you need a refill on your cardiac medications before your next appointment, please call your pharmacy*  Lab Work: None ordered this visit  Testing/Procedures: None ordered this visit  Follow-Up: At [facility], you and your health needs are our priority.  As part of our continuing mission to provide you with exceptional heart care, we have created designated Provider Care Teams.  These Care Teams include your primary Cardiologist (physician) and Advanced Practice Providers (APPs -  Physician Assistants and Nurse Practitioners) who all work together to provide you with the care you need, when you need it.   Your next appointment:   4 month(s) in [place]  The format for your next appointment:   In Person  Provider:   Minus Breeding, MD

## 2020-08-31 NOTE — Progress Notes (Signed)
Remote ICD transmission.   

## 2020-09-06 ENCOUNTER — Telehealth: Payer: Self-pay | Admitting: Oncology

## 2020-09-06 NOTE — Telephone Encounter (Signed)
Scheduled follow-up appointment per 12/28 schedule message. Patient is aware. 

## 2020-09-07 ENCOUNTER — Ambulatory Visit: Payer: PPO

## 2020-09-20 ENCOUNTER — Other Ambulatory Visit: Payer: Self-pay

## 2020-09-20 ENCOUNTER — Encounter: Payer: Self-pay | Admitting: Family Medicine

## 2020-09-20 ENCOUNTER — Ambulatory Visit (INDEPENDENT_AMBULATORY_CARE_PROVIDER_SITE_OTHER): Payer: PPO | Admitting: Family Medicine

## 2020-09-20 VITALS — BP 116/70 | HR 76 | Temp 97.1°F | Ht 71.0 in | Wt 213.4 lb

## 2020-09-20 DIAGNOSIS — Z7901 Long term (current) use of anticoagulants: Secondary | ICD-10-CM

## 2020-09-20 DIAGNOSIS — I482 Chronic atrial fibrillation, unspecified: Secondary | ICD-10-CM | POA: Diagnosis not present

## 2020-09-20 LAB — COAGUCHEK XS/INR WAIVED
INR: 2.9 — ABNORMAL HIGH (ref 0.9–1.1)
Prothrombin Time: 34.8 s

## 2020-09-20 NOTE — Progress Notes (Signed)
Chief Complaint  Patient presents with  . Anticoagulation    HPI  Patient presents today for . Patient in for follow-up of atrial fibrillation. Patient denies any recent bouts of chest pain or palpitations. Additionally, patient is taking anticoagulants for A. fib and valve replacement.. Patient denies any recent excessive bleeding episodes including epistaxis, bleeding from the gums, genitalia, rectal bleeding or hematuria. Additionally there has been no excessive bruising.  PMH: Smoking status noted ROS: Per HPI  Objective: BP 116/70   Pulse 76   Temp (!) 97.1 F (36.2 C) (Temporal)   Ht 5\' 11"  (1.803 m)   Wt 213 lb 6.4 oz (96.8 kg)   BMI 29.76 kg/m  Gen: NAD, alert, cooperative with exam HEENT: NCAT, EOMI, PERRL CV: RRR, good S1/S2, no murmur Resp: CTABL, no wheezes, non-labored Abd: SNTND, BS present, no guarding or organomegaly Ext: No edema, warm Neuro: Alert and oriented, No gross deficits  Assessment and plan:  1. Long term (current) use of anticoagulants   2. Chronic atrial fibrillation (HCC)     Continue Coumadin as is.  Follow-up 1 month with complete blood work for diabetes as well.  Orders Placed This Encounter  Procedures  . CoaguChek XS/INR Waived    Follow up one month Claretta Fraise, MD

## 2020-09-21 ENCOUNTER — Encounter: Payer: PPO | Admitting: Oncology

## 2020-09-21 ENCOUNTER — Telehealth: Payer: Self-pay | Admitting: Oncology

## 2020-09-21 NOTE — Telephone Encounter (Signed)
Rescheduled appt per 1/11 incoming call from pt requesting to reschedule appt due to weather. Pt confirmed new appt date/time.

## 2020-09-26 ENCOUNTER — Ambulatory Visit: Payer: PPO | Admitting: Oncology

## 2020-10-06 ENCOUNTER — Other Ambulatory Visit: Payer: Self-pay

## 2020-10-06 ENCOUNTER — Inpatient Hospital Stay: Payer: PPO | Attending: Oncology | Admitting: Oncology

## 2020-10-06 VITALS — BP 113/60 | HR 76 | Temp 97.8°F | Resp 18 | Ht 71.0 in | Wt 211.9 lb

## 2020-10-06 DIAGNOSIS — C8590 Non-Hodgkin lymphoma, unspecified, unspecified site: Secondary | ICD-10-CM

## 2020-10-06 DIAGNOSIS — D631 Anemia in chronic kidney disease: Secondary | ICD-10-CM | POA: Insufficient documentation

## 2020-10-06 DIAGNOSIS — Z8572 Personal history of non-Hodgkin lymphomas: Secondary | ICD-10-CM | POA: Insufficient documentation

## 2020-10-06 DIAGNOSIS — N189 Chronic kidney disease, unspecified: Secondary | ICD-10-CM | POA: Insufficient documentation

## 2020-10-06 NOTE — Progress Notes (Signed)
Hematology and Oncology Follow Up Visit  John [name] [id number] 01/08/1951 70 y.o. 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  Prior Therapy: He is status post exploratory laparotomy and excision of an intra-abdominal mass and ileocolectomy and a Port-A-Cath placement on 02/09/2013 Chemotherapy with CHOP-R cycle one given on 04/16/2013. He is status post 4 cycles of chemotherapy completed in 06/2013.  Current therapy: Active surveillance.  Interim History:  John [name] returns today for a follow-up evaluation.  Since the last visit, he reports feeling reasonably well at this time without any complaints.  He had ICD implanted in June 2021 and a hospitalization in October 2021 for unstable angina and required catheterization and stent placement.  Since that time, he has recovered reasonably well without any residual complaints.  He remains on warfarin and Plavix without any issues.  He denies any abdominal pain or diarrhea.  He has lost some weight related to his hospitalization but has regained his appetite back.     Medications: Reviewed without changes. Current Outpatient Medications  Medication Sig Dispense Refill  . clopidogrel (PLAVIX) 75 MG tablet Take 1 tablet (75 mg total) by mouth daily with breakfast. 90 tablet 3  . furosemide (LASIX) 20 MG tablet Take 1 tablet (20 mg total) by mouth daily. 30 tablet 2  . glipiZIDE (GLUCOTROL XL) 10 MG 24 hr tablet Take 1 tablet (10 mg total) by mouth daily. 90 tablet 1  . glucose blood (ONETOUCH VERIO) test strip USE TO TEST TWICE A DAY 200 strip 3  . insulin glargine (LANTUS) 100 UNIT/ML injection Inject 0.7 mLs (70 Units total) into the skin at bedtime. (Patient taking differently: Inject 70 Units into the skin. Take 70 units if over 180 at bedtime) 10 mL 11  . isosorbide mononitrate (IMDUR) 30 MG 24 hr tablet Take 1  tablet (30 mg total) by mouth daily. 90 tablet 3  . metFORMIN (GLUCOPHAGE) 500 MG tablet Take 1 tablet (500 mg total) by mouth daily with breakfast. 90 tablet 1  . metoprolol (TOPROL-XL) 200 MG 24 hr tablet Take 1 tablet (200 mg total) by mouth daily after breakfast. 90 tablet 0  . nitroGLYCERIN (NITROSTAT) 0.4 MG SL tablet Place 1 tablet (0.4 mg total) under the tongue every 5 (five) minutes as needed for chest pain. 50 tablet 10  . OneTouch Delica Lancets 31D MISC 1 each by Other route 2 (two) times daily. Dx E11.9 200 each 3  . pantoprazole (PROTONIX) 40 MG tablet Take 1 tablet (40 mg total) by mouth daily. 90 tablet 3  . rosuvastatin (CRESTOR) 20 MG tablet Take 1 tablet (20 mg total) by mouth daily. 60 tablet 4  . sacubitril-valsartan (ENTRESTO) 24-26 MG Take 1 tablet by mouth 2 (two) times daily. 180 tablet 3  . sitaGLIPtin (JANUVIA) 50 MG tablet Take 1 tablet (50 mg total) by mouth daily. 90 tablet 0  . warfarin (COUMADIN) 5 MG tablet Take 1 tablet (5 mg total) by mouth daily. 90 tablet 1   No current facility-administered medications for this visit.     Allergies:  Allergies  Allergen Reactions  . Doxycycline Hives    .    Physical Exam:  Blood pressure 113/60, pulse 76, temperature 97.8 F (36.6 C), temperature source Tympanic, resp. rate 18, height 5\' 11"  (1.803 m), weight 211 lb 14.4 oz (96.1 kg), SpO2 100 %.    ECOG: 1  General appearance: Comfortable appearing without any discomfort Head: Normocephalic without any trauma Oropharynx: Mucous membranes are moist and pink without any thrush or ulcers. Eyes: Pupils are equal and round reactive to light. Lymph nodes: No cervical, supraclavicular, inguinal or axillary lymphadenopathy.   Heart:regular rate and rhythm.  S1 and S2 without leg edema. Lung: Clear without any rhonchi or wheezes.  No dullness to percussion. Abdomin: Soft, nontender, nondistended with good bowel sounds.  No  hepatosplenomegaly. Musculoskeletal: No joint deformity or effusion.  Full range of motion noted. Neurological: No deficits noted on motor, sensory and deep tendon reflex exam. Skin: No petechial rash or dryness.  Appeared moist.     Lab Results: Lab Results  Component Value Date   WBC 4.7 07/01/2020   HGB 10.1 (L) 07/01/2020   HCT 31.5 (L) 07/01/2020   MCV 89.2 07/01/2020   PLT 204 07/01/2020     Chemistry      Component Value Date/Time   NA 139 07/29/2020 1146   NA 141 07/09/2017 0818   K 5.3 (H) 07/29/2020 1146   K 4.9 07/09/2017 0818   CL 105 07/29/2020 1146   CL 106 01/21/2013 1050   CO2 20 07/29/2020 1146   CO2 21 (L) 07/09/2017 0818   BUN 64 (H) 07/29/2020 1146   BUN 44.9 (H) 07/09/2017 0818   CREATININE 1.80 (H) 07/29/2020 1146   CREATININE 1.40 (H) 07/07/2019 0740   CREATININE 1.4 (H) 07/09/2017 0818      Component Value Date/Time   CALCIUM 9.1 07/29/2020 1146   CALCIUM 9.1 07/09/2017 0818   ALKPHOS 125 (H) 05/19/2020 0811   ALKPHOS 104 07/09/2017 0818   AST 18 05/19/2020 0811   AST 20 07/07/2019 0740   AST 22 07/09/2017 0818   ALT 14 05/19/2020 0811   ALT 19 07/07/2019 0740   ALT 19 07/09/2017 0818   BILITOT 0.5 05/19/2020 0811   BILITOT 0.5 07/07/2019 0740   BILITOT 0.50 07/09/2017 0818       Impression and Plan:  70 year old man with:   1. Diffuse large cell lymphoma presented with stage IIa disease and abdominal adenopathy diagnosed in 2014.   The natural course of this disease was reviewed and risk of relapse was assessed.  At this time his risk of relapse is low and repeat imaging studies will only be done as needed.  Future treatment options would include retreatment with systemic chemotherapy and oral targeted therapy.  2. Anemia: Related to his recent hospitalizations and chronic renal insufficiency.  Hemoglobin stable at this time.  3. Chronic renal insufficiency: Related to longstanding diabetes.  His kidney function is back to  baseline.  4. Followup: In 1 year for a follow-up visit.  20  minutes were dedicated to this visit.  The time was spent on reviewing his disease status, discussing treatment options and future plan of care reviewed.   Zola Button 1/27/20223:34 PM

## 2020-10-31 ENCOUNTER — Other Ambulatory Visit: Payer: Self-pay

## 2020-10-31 ENCOUNTER — Other Ambulatory Visit: Payer: PPO

## 2020-10-31 DIAGNOSIS — I10 Essential (primary) hypertension: Secondary | ICD-10-CM | POA: Diagnosis not present

## 2020-10-31 DIAGNOSIS — E785 Hyperlipidemia, unspecified: Secondary | ICD-10-CM

## 2020-10-31 DIAGNOSIS — Z7901 Long term (current) use of anticoagulants: Secondary | ICD-10-CM

## 2020-10-31 DIAGNOSIS — E119 Type 2 diabetes mellitus without complications: Secondary | ICD-10-CM

## 2020-10-31 DIAGNOSIS — Z7689 Persons encountering health services in other specified circumstances: Secondary | ICD-10-CM | POA: Diagnosis not present

## 2020-10-31 DIAGNOSIS — Z794 Long term (current) use of insulin: Secondary | ICD-10-CM

## 2020-10-31 LAB — BAYER DCA HB A1C WAIVED: HB A1C (BAYER DCA - WAIVED): 7 % — ABNORMAL HIGH (ref <7.0)

## 2020-11-01 ENCOUNTER — Ambulatory Visit (INDEPENDENT_AMBULATORY_CARE_PROVIDER_SITE_OTHER): Payer: PPO | Admitting: Family Medicine

## 2020-11-01 ENCOUNTER — Encounter: Payer: Self-pay | Admitting: Family Medicine

## 2020-11-01 VITALS — BP 109/54 | HR 76 | Temp 98.3°F | Resp 20 | Ht 71.0 in | Wt 215.0 lb

## 2020-11-01 DIAGNOSIS — N183 Chronic kidney disease, stage 3 unspecified: Secondary | ICD-10-CM | POA: Diagnosis not present

## 2020-11-01 DIAGNOSIS — N1831 Chronic kidney disease, stage 3a: Secondary | ICD-10-CM | POA: Diagnosis not present

## 2020-11-01 DIAGNOSIS — Z794 Long term (current) use of insulin: Secondary | ICD-10-CM

## 2020-11-01 DIAGNOSIS — I10 Essential (primary) hypertension: Secondary | ICD-10-CM | POA: Diagnosis not present

## 2020-11-01 DIAGNOSIS — I5022 Chronic systolic (congestive) heart failure: Secondary | ICD-10-CM

## 2020-11-01 DIAGNOSIS — Z0001 Encounter for general adult medical examination with abnormal findings: Secondary | ICD-10-CM

## 2020-11-01 DIAGNOSIS — I482 Chronic atrial fibrillation, unspecified: Secondary | ICD-10-CM

## 2020-11-01 DIAGNOSIS — E1122 Type 2 diabetes mellitus with diabetic chronic kidney disease: Secondary | ICD-10-CM

## 2020-11-01 DIAGNOSIS — Z0000 Encounter for general adult medical examination without abnormal findings: Secondary | ICD-10-CM

## 2020-11-01 DIAGNOSIS — E785 Hyperlipidemia, unspecified: Secondary | ICD-10-CM | POA: Diagnosis not present

## 2020-11-01 DIAGNOSIS — Z23 Encounter for immunization: Secondary | ICD-10-CM | POA: Diagnosis not present

## 2020-11-01 LAB — COAGUCHEK XS/INR WAIVED
INR: 2.7 — ABNORMAL HIGH (ref 0.9–1.1)
Prothrombin Time: 32.4 s

## 2020-11-01 MED ORDER — CANAGLIFLOZIN 300 MG PO TABS: 300.0000 mg | ORAL_TABLET | ORAL | 2 refills | Status: DC

## 2020-11-01 NOTE — Progress Notes (Signed)
Subjective:  Patient ID: John [name],  male    DOB: 1951/01/10  Age: 70 y.o.    CC: Anticoagulation (4 mo and protime ), Hypertension, Diabetes, and Medical Management of Chronic Issues   HPI John [name] presents for  follow-up of hypertension. Patient has no history of headache chest pain or shortness of breath or recent cough. Patient also denies symptoms of TIA such as numbness weakness lateralizing. Patient denies side effects from medication. States taking it regularly.  Patient also  in for follow-up of elevated cholesterol. Doing well without complaints on current medication. Denies side effects  including myalgia and arthralgia and nausea. Also in today for liver function testing. Currently no chest pain, shortness of breath or other cardiovascular related symptoms noted.  Follow-up of diabetes. Patient does check blood sugar at home.Doesn't keep track, but they were okay. Not very high or low.  Patient denies symptoms such as excessive hunger or urinary frequency, excessive hunger, nausea No significant hypoglycemic spells noted. Medications reviewed. Pt reports taking them regularly.  Patient reports that he was started on Farxiga by his cardiologist for his heart failure.  He could not tolerate that.  He discontinued it a few days ago.  Atrial fibrillation follow up. Also for artificial valve.  Pt. is treated with rate control and eanticoagulation. Pt.  denies palpitations, rapid rate, chest pain, dyspnea and edema. There has been no bleeding from nose or gums. Pt. has not noticed blood with urine or stool.  Although there is routine bruising easily, it is not excessive.      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   He has a past surgical history that includes Mitral valve replacement (11/2000); laparotomy (N/A, 02/09/2013); Portacath placement (N/A, 02/09/2013); Coronary artery bypass graft (2002); Septoplasty; Application if wound vac (2014); Colonoscopy; Esophagogastroduodenoscopy; Laparoscopic incisional / umbilical / ventral hernia repair (03/15/2014); Port-a-cath removal (03/15/2014); Hernia repair; Cardiac catheterization (2002); Incisional hernia repair (N/A, 03/15/2014); Port-a-cath removal (Left, 03/15/2014); Insertion of mesh (N/A, 03/15/2014); RIGHT/LEFT HEART CATH AND CORONARY/GRAFT ANGIOGRAPHY (N/A, 05/10/2017); Coronary artery bypass graft; Small intestine surgery; ICD IMPLANT (N/A, 02/19/2020); RIGHT/LEFT HEART CATH AND CORONARY/GRAFT ANGIOGRAPHY (N/A, 06/23/2020); CORONARY STENT INTERVENTION (N/A, 06/24/2020); CORONARY ATHERECTOMY (N/A, 06/24/2020); and CORONARY BALLOON ANGIOPLASTY (N/A, 06/24/2020).   His family history includes Arthritis in his brother; Depression in his brother; Diabetes in his brother, brother, and mother; Heart disease in his brother and brother; Hyperlipidemia in his brother and brother; Hypertension in his brother, brother, and father; Thyroid disease in his sister.He reports that he has quit smoking. His smoking use included cigars. He quit after 40.00 years of use. He has never used smokeless tobacco. He reports that he does not drink alcohol and does not use drugs.  Current Outpatient Medications on File Prior to Visit  Medication Sig Dispense Refill  . clopidogrel (PLAVIX) 75 MG tablet Take 1 tablet (75 mg total) by mouth daily with  breakfast. 90 tablet 3  . furosemide (LASIX) 20 MG tablet Take 1 tablet (20 mg total) by mouth daily. [place]  tablet 2  . glipiZIDE (GLUCOTROL XL) 10 MG 24 hr tablet Take 1 tablet (10 mg total) by mouth daily. 90 tablet 1  . glucose blood (ONETOUCH VERIO) test strip USE TO TEST TWICE A DAY 200 strip 3  . insulin glargine (LANTUS) 100 UNIT/ML injection Inject 0.7 mLs (70 Units total) into the skin at bedtime. (Patient taking differently: Inject 70 Units into the skin. Take 70 units if over 180 at bedtime) 10 mL 11  . isosorbide mononitrate (IMDUR) 30 MG 24 hr tablet Take 1 tablet (30 mg total) by mouth daily. 90 tablet 3  . metFORMIN (GLUCOPHAGE) 500 MG tablet Take 1 tablet (500 mg total) by mouth daily with breakfast. 90 tablet 1  . metoprolol (TOPROL-XL) 200 MG 24 hr tablet Take 1 tablet (200 mg total) by mouth daily after breakfast. 90 tablet 0  . OneTouch Delica Lancets 47M MISC 1 each by Other route 2 (two) times daily. Dx E11.9 200 each 3  . pantoprazole (PROTONIX) 40 MG tablet Take 1 tablet (40 mg total) by mouth daily. 90 tablet 3  . rosuvastatin (CRESTOR) 20 MG tablet Take 1 tablet (20 mg total) by mouth daily. 60 tablet 4  . sacubitril-valsartan (ENTRESTO) 24-26 MG Take 1 tablet by mouth 2 (two) times daily. 180 tablet 3  . sitaGLIPtin (JANUVIA) 50 MG tablet Take 1 tablet (50 mg total) by mouth daily. 90 tablet 0  . warfarin (COUMADIN) 5 MG tablet Take 1 tablet (5 mg total) by mouth daily. 90 tablet 1  . nitroGLYCERIN (NITROSTAT) 0.4 MG SL tablet Place 1 tablet (0.4 mg total) under the tongue every 5 (five) minutes as needed for chest pain. (Patient not taking: Reported on 11/01/2020) 50 tablet 10   No current facility-administered medications on file prior to visit.    ROS Review of Systems  Constitutional: Negative for fever.  Respiratory: Positive for shortness of breath (baseline).   Cardiovascular: Positive for leg swelling. Negative for chest pain.  Musculoskeletal: Negative for  arthralgias.  Skin: Negative for rash.    Objective:  BP (!) 109/54   Pulse 76   Temp 98.3 F (36.8 C)   Resp 20   Ht 5\' 11"  (1.803 m)   Wt 215 lb (97.5 kg)   SpO2 100%   BMI 29.99 kg/m   BP Readings from Last 3 Encounters:  11/01/20 (!) 109/54  10/06/20 113/60  09/20/20 116/70    Wt Readings from Last 3 Encounters:  11/01/20 215 lb (97.5 kg)  10/06/20 211 lb 14.4 oz (96.1 kg)  09/20/20 213 lb 6.4 oz (96.8 kg)    Physical Exam Vitals reviewed.  Constitutional:      Appearance: He is well-developed and well-nourished.  HENT:     Head: Normocephalic and atraumatic.     Right Ear: Tympanic membrane and external ear normal. No decreased hearing noted.     Left Ear: Tympanic membrane and external ear normal. No decreased hearing noted.     Mouth/Throat:     Pharynx: No oropharyngeal exudate or posterior oropharyngeal erythema.  Eyes:     Pupils: Pupils are equal, round, and reactive to light.  Cardiovascular:     Rate and Rhythm: Normal rate and regular rhythm.     Heart sounds: No murmur heard.   Pulmonary:     Effort: No respiratory distress.     Breath sounds: No rales.     Comments: Breath sounds are distant Abdominal:     General: Bowel sounds are normal.  Palpations: Abdomen is soft. There is no mass.     Tenderness: There is no abdominal tenderness.  Musculoskeletal:        General: Swelling (Both lower extremities 2+ pitting.  There is 4 cm blister filled with fluid at the anterior shin 8 cm above anterior talofibular joint) present.     Cervical back: Normal range of motion and neck supple.         Assessment & Plan:   John [name] was seen today for anticoagulation, hypertension, diabetes and medical management of chronic issues.  Diagnoses and all orders for this visit:  Chronic atrial fibrillation ([place]) -     CoaguChek XS/INR Waived  Type 2 diabetes mellitus with stage 3a chronic kidney disease, with long-term current use of insulin  (HCC)  Chronic systolic congestive heart failure ([place])  Essential hypertension  Hyperlipidemia, unspecified hyperlipidemia type  Well adult exam  Other orders -     canagliflozin (INVOKANA) 300 MG TABS tablet; Take 1 tablet (300 mg total) by mouth every other day. -     Pneumococcal polysaccharide vaccine 23-valent greater than or equal to 2yo subcutaneous/IM   I am having John [name] start on canagliflozin. I am also having him maintain his nitroGLYCERIN, OneTouch Delica Lancets 09Q, OneTouch Verio, warfarin, metoprolol, glipiZIDE, sitaGLIPtin, rosuvastatin, pantoprazole, clopidogrel, insulin glargine, isosorbide mononitrate, metFORMIN, furosemide, and Entresto.  Meds ordered this encounter  Medications  . canagliflozin (INVOKANA) 300 MG TABS tablet    Sig: Take 1 tablet (300 mg total) by mouth every other day.    Dispense:  30 tablet    Refill:  2     Follow-up: Return in about 1 month (around 11/29/2020), or if symptoms worsen or fail to improve.  Claretta Fraise, M.D.

## 2020-11-02 ENCOUNTER — Other Ambulatory Visit: Payer: Self-pay

## 2020-11-02 ENCOUNTER — Telehealth: Payer: Self-pay | Admitting: Cardiology

## 2020-11-02 DIAGNOSIS — R710 Precipitous drop in hematocrit: Secondary | ICD-10-CM

## 2020-11-02 LAB — CMP14+EGFR
ALT: 15 IU/L (ref 0–44)
AST: 22 IU/L (ref 0–40)
Albumin/Globulin Ratio: 1.6 (ref 1.2–2.2)
Albumin: 4.1 g/dL (ref 3.8–4.8)
Alkaline Phosphatase: 129 IU/L — ABNORMAL HIGH (ref 44–121)
BUN/Creatinine Ratio: 34 — ABNORMAL HIGH (ref 10–24)
BUN: 57 mg/dL — ABNORMAL HIGH (ref 8–27)
Bilirubin Total: 0.4 mg/dL (ref 0.0–1.2)
CO2: 17 mmol/L — ABNORMAL LOW (ref 20–29)
Calcium: 9.1 mg/dL (ref 8.6–10.2)
Chloride: 110 mmol/L — ABNORMAL HIGH (ref 96–106)
Creatinine, Ser: 1.7 mg/dL — ABNORMAL HIGH (ref 0.76–1.27)
GFR calc Af Amer: 47 mL/min/{1.73_m2} — ABNORMAL LOW (ref >59)
GFR calc non Af Amer: 41 mL/min/{1.73_m2} — ABNORMAL LOW (ref >59)
Globulin, Total: 2.5 g/dL (ref 1.5–4.5)
Glucose: 121 mg/dL — ABNORMAL HIGH (ref 65–99)
Potassium: 5.2 mmol/L (ref 3.5–5.2)
Sodium: 145 mmol/L — ABNORMAL HIGH (ref 134–144)
Total Protein: 6.6 g/dL (ref 6.0–8.5)

## 2020-11-02 LAB — CBC WITH DIFFERENTIAL/PLATELET
Basophils Absolute: 0 10*3/uL (ref 0.0–0.2)
Basos: 1 %
EOS (ABSOLUTE): 0.2 10*3/uL (ref 0.0–0.4)
Eos: 3 %
Hematocrit: 40.6 % (ref 37.5–51.0)
Hemoglobin: 12 g/dL — ABNORMAL LOW (ref 13.0–17.7)
Immature Grans (Abs): 0 10*3/uL (ref 0.0–0.1)
Immature Granulocytes: 0 %
Lymphocytes Absolute: 0.9 10*3/uL (ref 0.7–3.1)
Lymphs: 16 %
MCH: 24.3 pg — ABNORMAL LOW (ref 26.6–33.0)
MCHC: 29.6 g/dL — ABNORMAL LOW (ref 31.5–35.7)
MCV: 82 fL (ref 79–97)
Monocytes Absolute: 0.7 10*3/uL (ref 0.1–0.9)
Monocytes: 13 %
Neutrophils Absolute: 3.8 10*3/uL (ref 1.4–7.0)
Neutrophils: 67 %
Platelets: 205 10*3/uL (ref 150–450)
RBC: 4.94 x10E6/uL (ref 4.14–5.80)
RDW: 15.8 % — ABNORMAL HIGH (ref 11.6–15.4)
WBC: 5.7 10*3/uL (ref 3.4–10.8)

## 2020-11-02 LAB — LIPID PANEL
Chol/HDL Ratio: 3.7 ratio (ref 0.0–5.0)
Cholesterol, Total: 96 mg/dL — ABNORMAL LOW (ref 100–199)
HDL: 26 mg/dL — ABNORMAL LOW (ref >39)
LDL Chol Calc (NIH): 51 mg/dL (ref 0–99)
Triglycerides: 98 mg/dL (ref 0–149)
VLDL Cholesterol Cal: 19 mg/dL (ref 5–40)

## 2020-11-02 NOTE — Telephone Encounter (Signed)
Returned call to patient no answer.Left message to call back. 

## 2020-11-02 NOTE — Telephone Encounter (Signed)
Pt c/o medication issue:  1. Name of Medication: Entresto  2. How are you currently taking this medication (dosage and times per day)? 2 times a day    3. Are you having a reaction (difficulty breathing--STAT)?yes  4. What is your medication issue? *feel weak, tired and short of breath- pt would like to be seen asap- I made an appt with Lurena Joiner on 11-08-20

## 2020-11-02 NOTE — Telephone Encounter (Signed)
Follow Up:      Pt is returning yCheryl's call.

## 2020-11-02 NOTE — Telephone Encounter (Signed)
Spoke to patient he stated he has been feeling tired,no energy,sob for over 1 month.No chest pain.Advised to keep appointment already scheduled with Kerin Ransom PA 11/08/20 at 2:15 pm.Advised go to ED if needed.

## 2020-11-03 ENCOUNTER — Other Ambulatory Visit: Payer: Self-pay | Admitting: Family Medicine

## 2020-11-03 DIAGNOSIS — I255 Ischemic cardiomyopathy: Secondary | ICD-10-CM

## 2020-11-03 DIAGNOSIS — I25118 Atherosclerotic heart disease of native coronary artery with other forms of angina pectoris: Secondary | ICD-10-CM

## 2020-11-03 DIAGNOSIS — I10 Essential (primary) hypertension: Secondary | ICD-10-CM

## 2020-11-03 LAB — IRON AND TIBC
Iron Saturation: 7 % — CL (ref 15–55)
Iron: 29 ug/dL — ABNORMAL LOW (ref 38–169)
Total Iron Binding Capacity: 407 ug/dL (ref 250–450)
UIBC: 378 ug/dL — ABNORMAL HIGH (ref 111–343)

## 2020-11-03 LAB — SPECIMEN STATUS REPORT

## 2020-11-03 LAB — FERRITIN: Ferritin: 36 ng/mL (ref 30–400)

## 2020-11-08 ENCOUNTER — Other Ambulatory Visit: Payer: Self-pay

## 2020-11-08 ENCOUNTER — Encounter: Payer: Self-pay | Admitting: Cardiology

## 2020-11-08 ENCOUNTER — Ambulatory Visit: Payer: PPO | Admitting: Cardiology

## 2020-11-08 VITALS — BP 108/58 | HR 78 | Ht 71.0 in | Wt 210.4 lb

## 2020-11-08 DIAGNOSIS — I5023 Acute on chronic systolic (congestive) heart failure: Secondary | ICD-10-CM

## 2020-11-08 DIAGNOSIS — I255 Ischemic cardiomyopathy: Secondary | ICD-10-CM | POA: Diagnosis not present

## 2020-11-08 DIAGNOSIS — Z7901 Long term (current) use of anticoagulants: Secondary | ICD-10-CM

## 2020-11-08 DIAGNOSIS — N183 Chronic kidney disease, stage 3 unspecified: Secondary | ICD-10-CM

## 2020-11-08 DIAGNOSIS — Z95810 Presence of automatic (implantable) cardiac defibrillator: Secondary | ICD-10-CM | POA: Diagnosis not present

## 2020-11-08 DIAGNOSIS — I4819 Other persistent atrial fibrillation: Secondary | ICD-10-CM | POA: Diagnosis not present

## 2020-11-08 DIAGNOSIS — Z9889 Other specified postprocedural states: Secondary | ICD-10-CM

## 2020-11-08 DIAGNOSIS — I2510 Atherosclerotic heart disease of native coronary artery without angina pectoris: Secondary | ICD-10-CM | POA: Diagnosis not present

## 2020-11-08 DIAGNOSIS — I519 Heart disease, unspecified: Secondary | ICD-10-CM

## 2020-11-08 MED ORDER — FUROSEMIDE 40 MG PO TABS: 40.0000 mg | ORAL_TABLET | Freq: Every day | ORAL | 2 refills | Status: DC

## 2020-11-08 NOTE — Patient Instructions (Signed)
Medication Instructions:  INCREASE- Furosemide(lasix) 40 mg by mouth daily  *If you need a refill on your cardiac medications before your next appointment, please call your pharmacy*   Lab Work: None Ordered   Testing/Procedures: None Ordered   Follow-Up: At [facility], you and your health needs are our priority.  As part of our continuing mission to provide you with exceptional heart care, we have created designated Provider Care Teams.  These Care Teams include your primary Cardiologist (physician) and Advanced Practice Providers (APPs -  Physician Assistants and Nurse Practitioners) who all work together to provide you with the care you need, when you need it.  We recommend signing up for the patient portal called "MyChart".  Sign up information is provided on this After Visit Summary.  MyChart is used to connect with patients for Virtual Visits (Telemedicine).  Patients are able to view lab/test results, encounter notes, upcoming appointments, etc.  Non-urgent messages can be sent to your provider as well.   To learn more about what you can do with MyChart, go to [email or web address].    Your next appointment:   Keep appointment on April 27th @ 9:40 am  The format for your next appointment:   In Person  Provider:   Minus Breeding, MD

## 2020-11-08 NOTE — Progress Notes (Signed)
Cardiology Office Note:    Date:  11/08/2020   ID:  John [name], DOB [birth year], MRN [id number]  PCP:  Claretta Fraise, MD  Cardiologist:  Minus Breeding, MD  Electrophysiologist:  Cristopher Peru, MD   Referring MD: Claretta Fraise, MD   CC: DOE  History of Present Illness:    John [name] is a 70 y.o. male with a hx of CAD s/p CABG (patent LIMA, occluded SVG to RCA), chronic systolic and dyastolic CHF (LVEF 34-19%), s/p mechanical mitral valve replacement 2/2 rheumatic heart disease, s/p ICD, HTN, CRI-3B, persistent Afib, and HLD. The patient was hospitalized in Nov 2021 for unstable angina. He underwent cath 06/23/20 with subsequent stent intervention 06/24/20 to severly calcified distal LMCA extending into the ostial and proximal LCx with successful PCI using orbital atherectomy and DES placement in the distal LMCA into the prox LCx. He was started on DAPT.  Imdur stopped for hypotension and he was ultimately started on home Entresto. The hospitalization complicated by AKI on CKD with creatinine up to 4.75 and nephrology was consulted. IT was suspected to be secondary to contrast induced nephropathy while and Entresto. His Scr was improving at discharge but eventually his [name] was cut back to the lowest dose 24-26 secondary to hyperkalemia.   The patient did report to Dr Percival Spanish that he felt like he was doing better at his [place] in Dec 2021.  He is in the office today for follow-up.  His wife accompanied him.  The patient says that he now has dyspnea with any exertion.  He denies any chest pain.  He is having increasing lower extremity edema.  He is not having orthopnea but he says he gets up frequently at night to urinate.    Past Medical History:  Diagnosis Date  . Allergy   . Anemia    "lost blood w/the cancer"  . Arthritis    back   . Atrial fibrillation ([place])    takes Coumadin daily  . Bruises easily    takes Coumadin daily  . CAD (coronary artery disease)    a. s/p  CABG 11/2000 (L-LAD, S-RCA);  b. Lexiscan Myoview 11/12: EF 45%, ischemia and possibly some scar at the base of the inferolateral wall;   c. Lex MV 11/13:  EF 44%, Inf and IL scar/soft tissue atten, small mild amt of inf ischemia,  . CHF (congestive heart failure) ([place])   . Constipation    takes Colace daily  . Enlarged prostate   . Flu 09/2013  . GERD (gastroesophageal reflux disease)    takes Nexium daily  . Glaucoma   . History of blood transfusion    "related to cancer"  . History of colon polyps   . HLD (hyperlipidemia)    takes Vytorin daily  . HTN (hypertension)    takes Amlodipine,Metoprolol, and Ramipril daily  . Insomnia    states he has always been like this.But doesn't take any meds  . Kidney stones    "I passed them all"  . Nocturia   . Peripheral edema    takes Furosemide daily  . Peripheral neuropathy   . Rheumatic fever ~ 1965  . Rheumatic heart disease    a. s/p mechanical (St. Jude) MVR 2002;  b. Echo 5/11: Mild LVH, EF 45-50%, mild AS, mild AI, mean gradient 11 mmHg, MVR with normal gradients, moderate LAE, mild RAE;  c.  Echo 11/13:   mod LVH, EF 55-60%, mild AS (mean 18 mmHg), mild AI, severe  LAE, mild RAE, PASP 41  . 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 SOB (shortness of breath)    with exertion  . Type II diabetes mellitus ([place])    takes Januvia,Glipizide,and Metformin daily as well as Lantus    Past Surgical History:  Procedure Laterality Date  . APPLICATION OF WOUND VAC  2014  . CARDIAC CATHETERIZATION  2002  . COLONOSCOPY    . CORONARY ARTERY BYPASS GRAFT  2002   x 3  . CORONARY ARTERY BYPASS GRAFT    . CORONARY ATHERECTOMY N/A 06/24/2020   Procedure: CORONARY ATHERECTOMY;  Surgeon: [name], MD;  Location: [facility];  Service: Cardiovascular;  Laterality: N/A;  . CORONARY BALLOON ANGIOPLASTY N/A 06/24/2020   Procedure: CORONARY BALLOON ANGIOPLASTY;  Surgeon: [name], MD;   Location: [facility];  Service: Cardiovascular;  Laterality: N/A;  . CORONARY STENT INTERVENTION N/A 06/24/2020   Procedure: CORONARY STENT INTERVENTION;  Surgeon: [name], MD;  Location: [facility];  Service: Cardiovascular;  Laterality: N/A;  . ESOPHAGOGASTRODUODENOSCOPY    . HERNIA REPAIR    . ICD IMPLANT N/A 02/19/2020   Procedure: ICD IMPLANT;  Surgeon: Evans Lance, MD;  Location: [facility];  Service: Cardiovascular;  Laterality: N/A;  . INCISIONAL HERNIA REPAIR N/A 03/15/2014   Procedure: LAPAROSCOPIC INCISIONAL HERNIA;  Surgeon: Harl Bowie, MD;  Location: [facility];  Service: General;  Laterality: N/A;  . INSERTION OF MESH N/A 03/15/2014   Procedure: INSERTION OF MESH;  Surgeon: Harl Bowie, MD;  Location: [facility];  Service: General;  Laterality: N/A;  . LAPAROSCOPIC INCISIONAL / UMBILICAL / [place]  03/15/2014   IHR  . LAPAROTOMY N/A 02/09/2013   Procedure: EXPLORATORY LAPAROTOMY WITH incisional biopsy intra- ABDOMINAL MASS ILOCECTOMY ;  Surgeon: Harl Bowie, MD;  Location: [facility];  Service: General;  Laterality: N/A;  . MITRAL VALVE REPLACEMENT  11/2000   #33 St. Jude mechanical mitral valve prosthesis. Dr. Servando Snare  . PORT-A-CATH REMOVAL  03/15/2014  . PORT-A-CATH REMOVAL Left 03/15/2014   Procedure: REMOVAL PORT-A-CATH;  Surgeon: Harl Bowie, MD;  Location: [facility];  Service: General;  Laterality: Left;  . PORTACATH PLACEMENT N/A 02/09/2013   Procedure: INSERTION PORT-A-CATH;  Surgeon: Harl Bowie, MD;  Location: [facility];  Service: General;  Laterality: N/A;  . RIGHT/LEFT HEART CATH AND CORONARY/GRAFT ANGIOGRAPHY N/A 05/10/2017   Procedure: RIGHT/LEFT HEART CATH AND CORONARY/GRAFT ANGIOGRAPHY;  Surgeon: Martinique, Peter M, MD;  Location: [facility];  Service: Cardiovascular;  Laterality: N/A;  . RIGHT/LEFT HEART CATH AND CORONARY/GRAFT ANGIOGRAPHY N/A 06/23/2020   Procedure: RIGHT/LEFT HEART CATH AND CORONARY/GRAFT  ANGIOGRAPHY;  Surgeon: Troy Sine, MD;  Location: [facility];  Service: Cardiovascular;  Laterality: N/A;  . SEPTOPLASTY    . SMALL INTESTINE SURGERY      Current Medications: Current Meds  Medication Sig  . clopidogrel (PLAVIX) 75 MG tablet Take 1 tablet (75 mg total) by mouth daily with breakfast.  . glipiZIDE (GLUCOTROL XL) 10 MG 24 hr tablet Take 1 tablet (10 mg total) by mouth daily.  Marland Kitchen glucose blood (ONETOUCH VERIO) test strip USE TO TEST TWICE A DAY  . insulin glargine (LANTUS) 100 UNIT/ML injection Inject 0.7 mLs (70 Units total) into the skin at bedtime. (Patient taking differently: Inject 70 Units into the skin. Take 70 units if over 180 at bedtime)  . isosorbide mononitrate (  IMDUR) 30 MG 24 hr tablet Take 1 tablet (30 mg total) by mouth daily.  . metFORMIN (GLUCOPHAGE) 500 MG tablet Take 1 tablet (500 mg total) by mouth daily with breakfast.  . metoprolol (TOPROL-XL) 200 MG 24 hr tablet TAKE 1 TABLET (200 MG TOTAL) BY MOUTH DAILY AFTER BREAKFAST.  Marland Kitchen nitroGLYCERIN (NITROSTAT) 0.4 MG SL tablet Place 1 tablet (0.4 mg total) under the tongue every 5 (five) minutes as needed for chest pain.  Glory Rosebush Delica Lancets 23F MISC 1 each by Other route 2 (two) times daily. Dx E11.9  . pantoprazole (PROTONIX) 40 MG tablet Take 1 tablet (40 mg total) by mouth daily.  . rosuvastatin (CRESTOR) 20 MG tablet Take 1 tablet (20 mg total) by mouth daily.  . sacubitril-valsartan (ENTRESTO) 24-26 MG Take 1 tablet by mouth 2 (two) times daily.  . sitaGLIPtin (JANUVIA) 50 MG tablet Take 1 tablet (50 mg total) by mouth daily.  Marland Kitchen warfarin (COUMADIN) 5 MG tablet Take 1 tablet (5 mg total) by mouth daily.  . [DISCONTINUED] furosemide (LASIX) 20 MG tablet Take 1 tablet (20 mg total) by mouth daily.     Allergies:   Doxycycline and Farxiga [dapagliflozin]   Social History   Socioeconomic History  . Marital status: Married    Spouse name: Marcie Bal  . Number of children: 1  . Years of  education: 70  . Highest education level: High school graduate  Occupational History  . Occupation: disabled  Tobacco Use  . Smoking status: Former Smoker    Years: 40.00    Types: Cigars  . Smokeless tobacco: Never Used  Vaping Use  . Vaping Use: Never used  Substance and Sexual Activity  . Alcohol use: No    Comment: "drank for 30 years; quit ~ 2000"  . Drug use: No    Types: Marijuana    Comment: quit in 2000 'reefer'  . Sexual activity: Yes  Other Topics Concern  . 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   Social Determinants of Health   Financial Resource Strain: Not on file  Food Insecurity: Not on file  Transportation Needs: Not on file  Physical Activity: Not on file  Stress: Not on file  Social Connections: Not on file     Family History: The patient's family history includes Arthritis in his brother; Depression in his brother; Diabetes in his brother, brother, and mother; Heart disease in his brother and brother; Hyperlipidemia in his brother and brother; Hypertension in his brother, brother, and father; Thyroid disease in his sister.  ROS:   Please see the history of present illness.     All other systems reviewed and are negative.  EKGs/Labs/Other Studies Reviewed:    The following studies were reviewed today: Echo 12/23/2019- IMPRESSIONS    1. EF remains severely depressed to slightly worse than echo done October  2019 . Left ventricular ejection fraction, by estimation, is 25 to 30%.  The left ventricle has severely decreased function. The left ventricle  demonstrates global hypokinesis. The  left ventricular internal cavity size was severely dilated. There is mild  left ventricular hypertrophy. Left ventricular diastolic parameters are  indeterminate.  2. Right ventricular systolic function is normal. The right ventricular  size is normal. There is moderately elevated pulmonary artery systolic   pressure.  3. Left atrial size was severely dilated.  4. Normal appearing mechanical bileaflet mitral valve replacement with no  PVL . The mitral valve has  been repaired/replaced. No evidence of mitral  valve regurgitation.  5. AS and mean gradient stable since echo done 07/03/18. The aortic valve  has an indeterminant number of cusps. Aortic valve regurgitation is mild.  Mild aortic valve stenosis.  EKG:  EKG is ordered today.  The ekg ordered today demonstrates AF with RBBB- VR 78  Recent Labs: 06/20/2020: B Natriuretic Peptide 2,291.0 10/31/2020: ALT 15; BUN 57; Creatinine, Ser 1.70; Hemoglobin 12.0; Platelets 205; Potassium 5.2; Sodium 145  Recent Lipid Panel    Component Value Date/Time   CHOL 96 (L) 10/31/2020 0808   TRIG 98 10/31/2020 0808   HDL 26 (L) 10/31/2020 0808   CHOLHDL 3.7 10/31/2020 0808   LDLCALC 51 10/31/2020 0808   LDLDIRECT 88 09/23/2017 1419    Physical Exam:    VS:  BP (!) 108/58   Pulse 78   Ht 5\' 11"  (1.803 m)   Wt 210 lb 6.4 oz (95.4 kg)   BMI 29.34 kg/m     Wt Readings from Last 3 Encounters:  11/08/20 210 lb 6.4 oz (95.4 kg)  11/01/20 215 lb (97.5 kg)  10/06/20 211 lb 14.4 oz (96.1 kg)     GEN: Well nourished, well developed male, in no acute distress HEENT: Normal NECK: JVD noted, No carotid bruits CARDIAC: irregularly irregular rhythm with positive valve sounds, no rubs, gallops RESPIRATORY:  Faint rales at bases ABDOMEN: Soft, non-tender, non-distended MUSCULOSKELETAL:  1+ edema; No deformity  SKIN: Warm and dry NEUROLOGIC:  Alert and oriented x 3 PSYCHIATRIC:  Normal affect   ASSESSMENT:    Chronic combined systolic and diastolic heart failure: His situation is tenuous.  He does appear volume overloaded on exam.  He has had problems with hypotension and hyperkalemia on Entresto.  His last potassium was 5.2 on the low-dose of Entresto.  He is only on 20 mg of Lasix a day and I suggested he increase that to 40 mg daily.  He has a  follow-up with his nephrologist later this week, I will defer follow-up labs to him.  CAD s/p CABG:  S/P LM and CFX PCI with DES Oct 2021  Mechanical mitral valve:  Stable on previous echocardiogram  History of ICD:  Followed by electrophysiology service  Chronic atrial fibrillation:  Rate controlled  Anticoagulated- Chronic Coumadin for AF and MVR  CRI- Being followed by Dr Candiss Norse with [facility].  The patient tell me he did not tolerate Farxiga (dizzyness)   PLAN:    Increase Lasix to 40 mg daily (start today).  Keep f/u with Dr Candiss Norse as scheduled.    Medication Adjustments/Labs and Tests Ordered: Current medicines are reviewed at length with the patient today.  Concerns regarding medicines are outlined above.  Orders Placed This Encounter  Procedures  . EKG 12-Lead   Meds ordered this encounter  Medications  . furosemide (LASIX) 40 MG tablet    Sig: Take 1 tablet (40 mg total) by mouth daily.    Dispense:  90 tablet    Refill:  2    There are no Patient Instructions on file for this visit.   Signed, Kerin Ransom, PA-C  11/08/2020 2:18 PM    [facility]

## 2020-11-10 DIAGNOSIS — E872 Acidosis: Secondary | ICD-10-CM | POA: Diagnosis not present

## 2020-11-10 DIAGNOSIS — N183 Chronic kidney disease, stage 3 unspecified: Secondary | ICD-10-CM | POA: Diagnosis not present

## 2020-11-10 DIAGNOSIS — R778 Other specified abnormalities of plasma proteins: Secondary | ICD-10-CM | POA: Diagnosis not present

## 2020-11-10 DIAGNOSIS — I129 Hypertensive chronic kidney disease with stage 1 through stage 4 chronic kidney disease, or unspecified chronic kidney disease: Secondary | ICD-10-CM | POA: Diagnosis not present

## 2020-11-10 DIAGNOSIS — D631 Anemia in chronic kidney disease: Secondary | ICD-10-CM | POA: Diagnosis not present

## 2020-11-10 DIAGNOSIS — E875 Hyperkalemia: Secondary | ICD-10-CM | POA: Diagnosis not present

## 2020-11-18 ENCOUNTER — Ambulatory Visit (INDEPENDENT_AMBULATORY_CARE_PROVIDER_SITE_OTHER): Payer: PPO

## 2020-11-18 DIAGNOSIS — I255 Ischemic cardiomyopathy: Secondary | ICD-10-CM | POA: Diagnosis not present

## 2020-11-21 LAB — CUP PACEART REMOTE DEVICE CHECK
Battery Remaining Longevity: 101 mo
Battery Remaining Percentage: 90 %
Battery Voltage: 3.01 V
Brady Statistic RV Percent Paced: 1.5 %
Date Time Interrogation Session: 20220311020045
HighPow Impedance: 68 Ohm
Implantable Lead Implant Date: 20210611
Implantable Lead Location: 753860
Implantable Pulse Generator Implant Date: 20210611
Lead Channel Impedance Value: 390 Ohm
Lead Channel Pacing Threshold Amplitude: 0.75 V
Lead Channel Pacing Threshold Pulse Width: 0.5 ms
Lead Channel Sensing Intrinsic Amplitude: 12 mV
Lead Channel Setting Pacing Amplitude: 2 V
Lead Channel Setting Pacing Pulse Width: 0.5 ms
Lead Channel Setting Sensing Sensitivity: 0.5 mV
Pulse Gen Serial Number: 810002163

## 2020-11-25 NOTE — Progress Notes (Signed)
Remote ICD transmission.   

## 2020-11-30 ENCOUNTER — Other Ambulatory Visit: Payer: Self-pay

## 2020-11-30 ENCOUNTER — Encounter: Payer: Self-pay | Admitting: Family Medicine

## 2020-11-30 ENCOUNTER — Ambulatory Visit (INDEPENDENT_AMBULATORY_CARE_PROVIDER_SITE_OTHER): Payer: PPO | Admitting: Family Medicine

## 2020-11-30 VITALS — BP 97/54 | HR 71 | Temp 97.6°F | Ht 71.0 in | Wt 205.6 lb

## 2020-11-30 DIAGNOSIS — Z95810 Presence of automatic (implantable) cardiac defibrillator: Secondary | ICD-10-CM | POA: Diagnosis not present

## 2020-11-30 DIAGNOSIS — Z5181 Encounter for therapeutic drug level monitoring: Secondary | ICD-10-CM | POA: Diagnosis not present

## 2020-11-30 DIAGNOSIS — E1122 Type 2 diabetes mellitus with diabetic chronic kidney disease: Secondary | ICD-10-CM | POA: Diagnosis not present

## 2020-11-30 DIAGNOSIS — I482 Chronic atrial fibrillation, unspecified: Secondary | ICD-10-CM | POA: Diagnosis not present

## 2020-11-30 DIAGNOSIS — I10 Essential (primary) hypertension: Secondary | ICD-10-CM

## 2020-11-30 DIAGNOSIS — Z794 Long term (current) use of insulin: Secondary | ICD-10-CM | POA: Diagnosis not present

## 2020-11-30 DIAGNOSIS — I519 Heart disease, unspecified: Secondary | ICD-10-CM | POA: Diagnosis not present

## 2020-11-30 DIAGNOSIS — I25118 Atherosclerotic heart disease of native coronary artery with other forms of angina pectoris: Secondary | ICD-10-CM | POA: Diagnosis not present

## 2020-11-30 DIAGNOSIS — N1831 Chronic kidney disease, stage 3a: Secondary | ICD-10-CM

## 2020-11-30 LAB — COAGUCHEK XS/INR WAIVED
INR: 2.6 — ABNORMAL HIGH (ref 0.9–1.1)
Prothrombin Time: 30.7 s

## 2020-11-30 NOTE — Progress Notes (Signed)
Subjective:  Patient ID: John [name], male    DOB: 18-Nov-1950  Age: 70 y.o. MRN: [id number]  CC: Medical Management of Chronic Issues   HPI John [name] presents for INR check.   Atrial fibrillation follow up. Pt. is treated with rate control and anticoagulation. Pt.  denies palpitations, rapid rate, chest pain, dyspnea and edema. There has been no bleeding from nose or gums. Pt. has not noticed blood with urine or stool.  Although there is routine bruising easily, it is not excessive.   Breathing better. Tolerating invokana. Increased lasix 1/2 tablet a day. Now able to exercise more, better tolerated.  Couldn't tolerate farxiga Depression screen [facility] 2/9 11/30/2020 11/01/2020 09/20/2020  Decreased Interest 0 0 0  Down, Depressed, Hopeless 0 0 0  PHQ - 2 Score 0 0 0  Altered sleeping - - -  Tired, decreased energy - - -  Change in appetite - - -  Feeling bad or failure about yourself  - - -  Trouble concentrating - - -  Moving slowly or fidgety/restless - - -  Suicidal thoughts - - -  PHQ-9 Score - - -  Some recent data might be hidden    History John [name] has a past medical history of Abnormal nuclear stress test (05/10/2017), Allergy, Anemia, Arthritis, Atrial fibrillation ([place]), Bruises easily, CAD (coronary artery disease), CHF (congestive heart failure) ([place]), Constipation, 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   He has a past surgical history that includes Mitral valve replacement (11/2000);  laparotomy (N/A, 02/09/2013); Portacath placement (N/A, 02/09/2013); Coronary artery bypass graft (2002); Septoplasty; Application if wound vac (2014); Colonoscopy; Esophagogastroduodenoscopy; Laparoscopic incisional / umbilical / ventral hernia repair (03/15/2014); Port-a-cath removal (03/15/2014); Hernia repair; Cardiac catheterization (2002); Incisional hernia repair (N/A, 03/15/2014); Port-a-cath removal (Left, 03/15/2014); Insertion of mesh (N/A, 03/15/2014); RIGHT/LEFT HEART CATH AND CORONARY/GRAFT ANGIOGRAPHY (N/A, 05/10/2017); Coronary artery bypass graft; Small intestine surgery; ICD IMPLANT (N/A, 02/19/2020); RIGHT/LEFT HEART CATH AND CORONARY/GRAFT ANGIOGRAPHY (N/A, 06/23/2020); CORONARY STENT INTERVENTION (N/A, 06/24/2020); CORONARY ATHERECTOMY (N/A, 06/24/2020); and CORONARY BALLOON ANGIOPLASTY (N/A, 06/24/2020).   His family history includes Arthritis in his brother; Depression in his brother; Diabetes in his brother, brother, and mother; Heart disease in his brother and brother; Hyperlipidemia in his brother and brother; Hypertension in his brother, brother, and father; Thyroid disease in his sister.He reports that he has quit smoking. His smoking use included cigars. He quit after 40.00 years of use. He has never used smokeless tobacco. He reports that he does not drink alcohol and does not use drugs.    ROS Review of Systems  Constitutional: Negative for fever.  Respiratory: Positive for shortness of breath (improved).   Cardiovascular: Positive for leg swelling (much bettter). Negative for chest pain.  Musculoskeletal: Negative for arthralgias.  Skin: Negative for rash.    Objective:  BP (!) 97/54   Pulse 71   Temp 97.6 F (36.4 C)   Ht 5\' 11"  (1.803 m)   Wt 205 lb 9.6 oz (93.3 kg)   SpO2 99%   BMI 28.68 kg/m   BP Readings from Last 3 Encounters:  11/30/20 (!) 97/54  11/08/20 (!) 108/58  11/01/20 (!) 109/54    Wt Readings from Last 3 Encounters:  11/30/20 205 lb 9.6 oz (93.3 kg)   11/08/20 210 lb 6.4 oz (95.4 kg)  11/01/20 215 lb (97.5 kg)     Physical Exam Vitals reviewed.  Constitutional:      Appearance: He is well-developed.  HENT:     Head: Normocephalic and atraumatic.     Right Ear: External ear normal.     Left Ear: External ear normal.     Mouth/Throat:     Pharynx: No oropharyngeal exudate or posterior oropharyngeal erythema.  Eyes:     Pupils: Pupils are equal, round, and reactive to light.  Cardiovascular:     Rate and Rhythm: Normal rate and regular rhythm.     Heart sounds: No murmur heard.   Pulmonary:     Effort: No respiratory distress.     Breath sounds: Normal breath sounds.  Musculoskeletal:     Cervical back: Normal range of motion and neck supple.  Neurological:     Mental Status: He is alert and oriented to person, place, and time.       Assessment & Plan:   John [name] was seen today for medical management of chronic issues.  Diagnoses and all orders for this visit:  Chronic atrial fibrillation ([place]) -     CoaguChek XS/INR Waived  ICD (implantable cardioverter-defibrillator) in place  Chronic systolic dysfunction of left ventricle  Coronary artery disease of native artery of native heart with stable angina pectoris ([place])  Encounter for therapeutic drug monitoring  Primary hypertension  Type 2 diabetes mellitus with stage 3a chronic kidney disease, with long-term current use of insulin ([place])    Continue meds as is. INcreasae exercise as tolerated.    I am having John [name] maintain his nitroGLYCERIN, OneTouch Delica Lancets 71I, OneTouch Verio, warfarin, glipiZIDE, sitaGLIPtin, rosuvastatin, pantoprazole, clopidogrel, insulin glargine, isosorbide mononitrate, metFORMIN, Entresto, metoprolol, furosemide, and canagliflozin.  Allergies as of 11/30/2020      Reactions   Doxycycline Hives   Farxiga [dapagliflozin]    Can not tolerate       Medication List       Accurate as of November 30, 2020  9:07 AM.  If you have any questions, ask your nurse or doctor.        clopidogrel 75 MG tablet Commonly known as: PLAVIX Take 1 tablet (75 mg total) by mouth daily with breakfast.   Entresto 24-26 MG Generic drug: sacubitril-valsartan Take 1 tablet by mouth 2 (two) times daily.   furosemide 40 MG tablet Commonly known as: LASIX Take 1 tablet (40 mg total) by mouth daily.   glipiZIDE 10 MG 24 hr tablet Commonly known as: GLUCOTROL XL Take 1 tablet (10 mg total) by mouth daily.   insulin glargine 100 UNIT/ML injection Commonly known as: LANTUS Inject 0.7 mLs (70 Units total) into the skin at bedtime. What changed:   when to take this  additional instructions   Invokana 300 MG Tabs tablet Generic drug: canagliflozin Take 300 mg by mouth every other day.   isosorbide mononitrate 30 MG 24 hr tablet Commonly known as: IMDUR Take 1 tablet (30 mg total) by mouth daily.   metFORMIN 500 MG tablet Commonly known as: GLUCOPHAGE Take 1 tablet (500 mg total) by mouth daily with breakfast.   metoprolol 200 MG 24 hr tablet Commonly known as: TOPROL-XL TAKE 1 TABLET (200 MG TOTAL) BY MOUTH DAILY AFTER BREAKFAST.   nitroGLYCERIN 0.4 MG SL tablet Commonly  known as: NITROSTAT Place 1 tablet (0.4 mg total) under the tongue every 5 (five) minutes as needed for chest pain.   OneTouch Delica Lancets 37G Misc 1 each by Other route 2 (two) times daily. Dx E11.9   OneTouch Verio test strip Generic drug: glucose blood USE TO TEST TWICE A DAY   pantoprazole 40 MG tablet Commonly known as: PROTONIX Take 1 tablet (40 mg total) by mouth daily.   rosuvastatin 20 MG tablet Commonly known as: CRESTOR Take 1 tablet (20 mg total) by mouth daily.   sitaGLIPtin 50 MG tablet Commonly known as: Januvia Take 1 tablet (50 mg total) by mouth daily.   warfarin 5 MG tablet Commonly known as: COUMADIN Take as directed by the anticoagulation clinic. If you are unsure how to take this medication, talk to  your nurse or doctor. Original instructions: Take 1 tablet (5 mg total) by mouth daily.        Follow-up: Return in about 1 month (around 12/31/2020).  Claretta Fraise, M.D.

## 2020-12-08 ENCOUNTER — Telehealth: Payer: Self-pay | Admitting: *Deleted

## 2020-12-08 NOTE — Telephone Encounter (Signed)
invokana rejected - put key into cover my meds and was given a number to call. (can not be done online)   Ph [phone]  Spoke with rep - was transferred to another dept and then no one picked up.   Will try again at a later time.

## 2020-12-14 NOTE — Telephone Encounter (Signed)
This medication is covered under Wynetta Emery and [employer] patient assistance program.   The card associated with this is  ID [id number] Group [id number] [place] and left detailed message asking to run medication with this card info.

## 2020-12-22 ENCOUNTER — Telehealth: Payer: Self-pay

## 2020-12-22 DIAGNOSIS — Z23 Encounter for immunization: Secondary | ICD-10-CM | POA: Diagnosis not present

## 2020-12-22 NOTE — Telephone Encounter (Signed)
Records Faxed to [facility]

## 2020-12-27 ENCOUNTER — Ambulatory Visit (INDEPENDENT_AMBULATORY_CARE_PROVIDER_SITE_OTHER): Payer: PPO

## 2020-12-27 DIAGNOSIS — Z0000 Encounter for general adult medical examination without abnormal findings: Secondary | ICD-10-CM | POA: Diagnosis not present

## 2020-12-27 NOTE — Patient Instructions (Signed)
  [place] Maintenance Summary and Written Plan of Care  John [name] ,  Thank you for allowing me to perform your Medicare Annual Wellness Visit and for your ongoing commitment to your health.   Health Maintenance & Immunization History Health Maintenance  Topic Date Due  . OPHTHALMOLOGY EXAM  02/09/2021  . COVID-19 Vaccine (4 - Booster for Moderna series) 02/16/2021  . INFLUENZA VACCINE  04/10/2021  . HEMOGLOBIN A1C  04/30/2021  . FOOT EXAM  05/19/2021  . COLONOSCOPY (Pts 45-79yrs Insurance coverage will need to be confirmed)  04/11/2028  . TETANUS/TDAP  07/22/2029  . Hepatitis C Screening  Completed  . PNA vac Low Risk Adult  Completed  . HPV VACCINES  Aged Out   Immunization History  Administered Date(s) Administered  . Fluad Quad(high Dose 65+) 05/11/2019  . Influenza Split 06/14/2014  . Influenza, High Dose Seasonal PF 07/02/2017, 06/09/2018  . Influenza, Seasonal, Injecte, Preservative Fre 06/17/2015, 07/04/2017  . Influenza,inj,Quad PF,6+ Mos 07/04/2017  . Influenza-Unspecified 06/11/2016, 07/02/2017, 05/19/2020  . Moderna SARS-COV2 Booster Vaccination 08/18/2020  . Moderna Sars-Covid-2 Vaccination 09/24/2019, 10/23/2019  . Pneumococcal Conjugate-13 06/10/2018  . Pneumococcal Polysaccharide-23 03/16/2014, 08/17/2015, 11/01/2020  . Tdap 09/29/2011, 07/23/2019  . Zoster Recombinat (Shingrix) 07/15/2018, 03/24/2019    These are the patient goals that we discussed:  Increase water intake  Increase physical activity   This is a list of Health Maintenance Items that are overdue or due now: There are no preventive care reminders to display for this patient.   Orders/Referrals Placed Today: No orders of the defined types were placed in this encounter.  (Contact our referral department at [phone] if you have not spoken with someone about your referral appointment within the next 5 days)    Follow-up Plan  Scheduled with Dr. Livia Snellen  01/02/2021 at 7:55am

## 2020-12-27 NOTE — Progress Notes (Signed)
MEDICARE ANNUAL WELLNESS VISIT  12/27/2020  Telephone Visit Disclaimer This Medicare AWV was conducted by telephone due to national recommendations for restrictions regarding the COVID-19 Pandemic (e.g. social distancing).  I verified, using two identifiers, that I am speaking with John [name] or their authorized healthcare agent. I discussed the limitations, risks, security, and privacy concerns of performing an evaluation and management service by telephone and the potential availability of an in-person appointment in the future. The John [name] expressed understanding and agreed to proceed.  Location of John [name]: Home Location of Provider (nurse):  [facility]  Subjective:    John [name] is a 70 y.o. male John [name] of [place], Cletus Gash, MD who had a Medicare Annual Wellness Visit today via telephone. John [name] is Retired and lives with their spouse. he has one child. he reports that he is socially active and does interact with friends/family regularly. he is minimally physically active and enjoys shooting pool and playing cards.  John [name] Care Team: Claretta Fraise, MD as PCP - General (Family Medicine) Minus Breeding, MD as PCP - Cardiology (Cardiology) Evans Lance, MD as PCP - Electrophysiology (Cardiology) Wyatt Portela, MD as Consulting Physician (Oncology) Minus Breeding, MD as Consulting Physician (Cardiology)  Advanced Directives 12/27/2020 06/20/2020 06/20/2020 02/19/2020 06/12/2019 06/10/2018 05/10/2017  Does John [name] Have a Medical Advance Directive? No No No No No No No  Would John [name] like information on creating a medical advance directive? No - John [name] declined No - John [name] declined - Yes (MAU/Ambulatory/Procedural Areas - Information given) No - John [name] declined Yes (MAU/Ambulatory/Procedural Areas - Information given) Yes (Inpatient - John [name] requests chaplain consult to create a medical advance directive);No - John [name] declined  Pre-existing out of  facility DNR order (yellow form or pink MOST form) - - - - - - [facility] Utilization Over the Past 12 Months: # of hospitalizations or ER visits: 1 # of surgeries: 1  Review of Systems    John [name] reports that his overall health is better compared to last year.  John [name] Reported Readings (BP, Pulse, CBG, Weight, etc) none  Pain Assessment Pain : No/denies pain     Current Medications & Allergies (verified) Allergies as of 12/27/2020      Reactions   Doxycycline Hives   Farxiga [dapagliflozin]    Can not tolerate       Medication List       Accurate as of December 27, 2020  9:43 AM. If you have any questions, ask your nurse or doctor.        canagliflozin 300 MG Tabs tablet Commonly known as: INVOKANA Take 300 mg by mouth every other day.   clopidogrel 75 MG tablet Commonly known as: PLAVIX Take 1 tablet (75 mg total) by mouth daily with breakfast.   Entresto 24-26 MG Generic drug: sacubitril-valsartan Take 1 tablet by mouth 2 (two) times daily.   furosemide 40 MG tablet Commonly known as: LASIX Take 1 tablet (40 mg total) by mouth daily.   glipiZIDE 10 MG 24 hr tablet Commonly known as: GLUCOTROL XL Take 1 tablet (10 mg total) by mouth daily.   insulin glargine 100 UNIT/ML injection Commonly known as: LANTUS Inject 0.7 mLs (70 Units total) into the skin at bedtime. What changed:   when to take this  additional instructions   isosorbide mononitrate 30 MG 24 hr tablet Commonly known as: IMDUR Take 1 tablet (30 mg total) by mouth daily.   metFORMIN 500 MG tablet Commonly known  as: GLUCOPHAGE Take 1 tablet (500 mg total) by mouth daily with breakfast.   metoprolol 200 MG 24 hr tablet Commonly known as: TOPROL-XL TAKE 1 TABLET (200 MG TOTAL) BY MOUTH DAILY AFTER BREAKFAST.   nitroGLYCERIN 0.4 MG SL tablet Commonly known as: NITROSTAT Place 1 tablet (0.4 mg total) under the tongue every 5 (five) minutes as needed for chest pain.   OneTouch Delica  Lancets 96E Misc 1 each by Other route 2 (two) times daily. Dx E11.9   OneTouch Verio test strip Generic drug: glucose blood USE TO TEST TWICE A DAY   pantoprazole 40 MG tablet Commonly known as: PROTONIX Take 1 tablet (40 mg total) by mouth daily.   rosuvastatin 20 MG tablet Commonly known as: CRESTOR Take 1 tablet (20 mg total) by mouth daily.   sitaGLIPtin 50 MG tablet Commonly known as: Januvia Take 1 tablet (50 mg total) by mouth daily.   warfarin 5 MG tablet Commonly known as: COUMADIN Take as directed by the anticoagulation clinic. If you are unsure how to take this medication, talk to your nurse or doctor. Original instructions: Take 1 tablet (5 mg total) by mouth daily.       History (reviewed): Past Medical History:  Diagnosis Date  . Abnormal nuclear stress test 05/10/2017  . Allergy   . Anemia    "lost blood w/the cancer"  . Arthritis    back   . Atrial fibrillation ([place])    takes Coumadin daily  . Bruises easily    takes Coumadin daily  . CAD (coronary artery disease)    a. s/p CABG 11/2000 (L-LAD, S-RCA);  b. Lexiscan Myoview 11/12: EF 45%, ischemia and possibly some scar at the base of the inferolateral wall;   c. Lex MV 11/13:  EF 44%, Inf and IL scar/soft tissue atten, small mild amt of inf ischemia,  . CHF (congestive heart failure) ([place])   . Constipation    takes Colace daily  . 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 Enlarged prostate   . Flu 09/2013  . GERD (gastroesophageal reflux disease)    takes Nexium daily  . Glaucoma   . History of blood transfusion    "related to cancer"  . History of colon polyps   . HLD (hyperlipidemia)    takes Vytorin daily  . HTN (hypertension)    takes Amlodipine,Metoprolol, and Ramipril daily  . Insomnia    states he has always been like this.But doesn't take any meds  . Kidney stones    "I passed them all"  . Nocturia   . Peripheral edema    takes Furosemide daily  . Peripheral neuropathy   .  Rheumatic fever ~ 1965  . Rheumatic heart disease    a. s/p mechanical (St. Jude) MVR 2002;  b. Echo 5/11: Mild LVH, EF 45-50%, mild AS, mild AI, mean gradient 11 mmHg, MVR with normal gradients, moderate LAE, mild RAE;  c.  Echo 11/13:   mod LVH, EF 55-60%, mild AS (mean 18 mmHg), mild AI, severe LAE, mild RAE, PASP 41  . 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 SOB (shortness of breath)    with exertion  . Type II diabetes mellitus ([place])    takes Januvia,Glipizide,and Metformin daily as well as Lantus  . Unstable angina ([place]) 06/21/2020   Past Surgical History:  Procedure Laterality Date  . APPLICATION OF WOUND VAC  2014  . CARDIAC CATHETERIZATION  2002  .  COLONOSCOPY    . CORONARY ARTERY BYPASS GRAFT  2002   x 3  . CORONARY ARTERY BYPASS GRAFT    . CORONARY ATHERECTOMY N/A 06/24/2020   Procedure: CORONARY ATHERECTOMY;  Surgeon: [name], MD;  Location: [place] [facility];  Service: Cardiovascular;  Laterality: N/A;  . CORONARY BALLOON ANGIOPLASTY N/A 06/24/2020   Procedure: CORONARY BALLOON ANGIOPLASTY;  Surgeon: [name], MD;  Location: [place] [facility];  Service: Cardiovascular;  Laterality: N/A;  . CORONARY STENT INTERVENTION N/A 06/24/2020   Procedure: CORONARY STENT INTERVENTION;  Surgeon: [name], MD;  Location: [place] [facility];  Service: Cardiovascular;  Laterality: N/A;  . ESOPHAGOGASTRODUODENOSCOPY    . HERNIA REPAIR    . ICD IMPLANT N/A 02/19/2020   Procedure: ICD IMPLANT;  Surgeon: Evans Lance, MD;  Location: [place] [facility];  Service: Cardiovascular;  Laterality: N/A;  . INCISIONAL HERNIA REPAIR N/A 03/15/2014   Procedure: LAPAROSCOPIC INCISIONAL HERNIA;  Surgeon: Harl Bowie, MD;  Location: [place];  Service: General;  Laterality: N/A;  . INSERTION OF MESH N/A 03/15/2014   Procedure: INSERTION OF MESH;  Surgeon: Harl Bowie, MD;  Location: [place];  Service: General;  Laterality: N/A;  .  LAPAROSCOPIC INCISIONAL / UMBILICAL / [place]  03/15/2014   IHR  . LAPAROTOMY N/A 02/09/2013   Procedure: EXPLORATORY LAPAROTOMY WITH incisional biopsy intra- ABDOMINAL MASS ILOCECTOMY ;  Surgeon: Harl Bowie, MD;  Location: [facility];  Service: General;  Laterality: N/A;  . MITRAL VALVE REPLACEMENT  11/2000   #33 St. Jude mechanical mitral valve prosthesis. Dr. Servando Snare  . PORT-A-CATH REMOVAL  03/15/2014  . PORT-A-CATH REMOVAL Left 03/15/2014   Procedure: REMOVAL PORT-A-CATH;  Surgeon: Harl Bowie, MD;  Location: [place];  Service: General;  Laterality: Left;  . PORTACATH PLACEMENT N/A 02/09/2013   Procedure: INSERTION PORT-A-CATH;  Surgeon: Harl Bowie, MD;  Location: [facility];  Service: General;  Laterality: N/A;  . RIGHT/LEFT HEART CATH AND CORONARY/GRAFT ANGIOGRAPHY N/A 05/10/2017   Procedure: RIGHT/LEFT HEART CATH AND CORONARY/GRAFT ANGIOGRAPHY;  Surgeon: Martinique, Peter M, MD;  Location: [place] [facility];  Service: Cardiovascular;  Laterality: N/A;  . RIGHT/LEFT HEART CATH AND CORONARY/GRAFT ANGIOGRAPHY N/A 06/23/2020   Procedure: RIGHT/LEFT HEART CATH AND CORONARY/GRAFT ANGIOGRAPHY;  Surgeon: Troy Sine, MD;  Location: [place] [facility];  Service: Cardiovascular;  Laterality: N/A;  . SEPTOPLASTY    . SMALL INTESTINE SURGERY     Family History  Problem Relation Age of Onset  . Diabetes Mother   . Hypertension Father   . Thyroid disease Sister   . Diabetes Brother   . Arthritis Brother   . Depression Brother   . Heart disease Brother   . Hyperlipidemia Brother   . Hypertension Brother   . Hypertension Brother   . Diabetes Brother   . Heart disease Brother   . Hyperlipidemia Brother    Social History   Socioeconomic History  . Marital status: Married    Spouse name: Marcie Bal  . Number of children: 1  . Years of education: 61  . Highest education level: High school graduate  Occupational History  . Occupation: disabled  Tobacco Use  . Smoking  status: Former Smoker    Years: 40.00    Types: Cigars  . Smokeless tobacco: Never Used  Vaping Use  . Vaping Use: Never used  Substance and Sexual Activity  . Alcohol use: No    Comment: "drank for 30 years;  quit ~ 2000"  . Drug use: No    Types: Marijuana    Comment: quit in 2000 'reefer'  . Sexual activity: Yes  Other Topics Concern  . 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   Social Determinants of Health   Financial Resource Strain: Not on file  Food Insecurity: Not on file  Transportation Needs: Not on file  Physical Activity: Not on file  Stress: Not on file  Social Connections: Not on file    Activities of Daily Living In your present state of health, do you have any difficulty performing the following activities: 12/27/2020 06/20/2020  Hearing? N -  Vision? N -  Difficulty concentrating or making decisions? N -  Walking or climbing stairs? N -  Dressing or bathing? N -  Doing errands, shopping? N N  Preparing Food and eating ? N -  Using the Toilet? N -  In the past six months, have you accidently leaked urine? N -  Do you have problems with loss of bowel control? N -  Managing your Medications? N -  Managing your Finances? N -  Housekeeping or managing your Housekeeping? N -  Some recent data might be hidden    John [name] Education/ Literacy How often do you need to have someone help you when you read instructions, pamphlets, or other written materials from your doctor or pharmacy?: 1 - Never What is the last grade level you completed in school?: 12th grade  Exercise Current Exercise Habits: Home exercise routine, Type of exercise: walking, Time (Minutes): 15, Frequency (Times/Week): 3, Weekly Exercise (Minutes/Week): 45  Diet John [name] reports consuming 3 meals a day and 0 snack(s) a day John [name] reports that his primary diet is: Regular John [name] reports that she does have regular access to food.   Depression  Screen PHQ 2/9 Scores 12/27/2020 11/30/2020 11/01/2020 09/20/2020 08/18/2020 08/12/2020 08/03/2020  PHQ - 2 Score 0 0 0 0 0 0 0  PHQ- 9 Score - - - - - - -     Fall Risk Fall Risk  12/27/2020 11/30/2020 11/01/2020 09/20/2020 08/18/2020  Falls in the past year? 0 0 0 0 0  Number falls in past yr: - - - 0 0  Injury with Fall? - - - 0 0  Risk for fall due to : - - - No Fall Risks No Fall Risks  Follow up - - - Falls evaluation completed Falls evaluation completed  Comment - - - - -     Objective:  John [name] seemed alert and oriented and he participated appropriately during our telephone visit.  Blood Pressure Weight BMI  BP Readings from Last 3 Encounters:  11/30/20 (!) 97/54  11/08/20 (!) 108/58  11/01/20 (!) 109/54   Wt Readings from Last 3 Encounters:  11/30/20 205 lb 9.6 oz (93.3 kg)  11/08/20 210 lb 6.4 oz (95.4 kg)  11/01/20 215 lb (97.5 kg)   BMI Readings from Last 1 Encounters:  11/30/20 28.68 kg/m    *Unable to obtain current vital signs, weight, and BMI due to telephone visit type  Hearing/Vision  . John [name] did not seem to have difficulty with hearing/understanding during the telephone conversation . Reports that he has had a formal eye exam by an eye care professional within the past year . Reports that he has not had a formal hearing evaluation within the past year *Unable to fully assess hearing and vision during telephone visit type  Cognitive  Function: 6CIT Screen 12/27/2020 12/27/2020 06/12/2019  What Year? - 0 points 0 points  What month? - 0 points 0 points  What time? - 0 points 0 points  Count back from 20 2 points 0 points 0 points  Months in reverse - 0 points 0 points  Repeat phrase 2 points 0 points 0 points  Total Score - 0 0   (Normal:0-7, Significant for Dysfunction: >8)  Normal Cognitive Function Screening: Yes   Immunization & Health Maintenance Record Immunization History  Administered Date(s) Administered  . Fluad Quad(high Dose 65+)  05/11/2019  . Influenza Split 06/14/2014  . Influenza, High Dose Seasonal PF 07/02/2017, 06/09/2018  . Influenza, Seasonal, Injecte, Preservative Fre 06/17/2015, 07/04/2017  . Influenza,inj,Quad PF,6+ Mos 07/04/2017  . Influenza-Unspecified 06/11/2016, 07/02/2017, 05/19/2020  . Moderna SARS-COV2 Booster Vaccination 08/18/2020  . Moderna Sars-Covid-2 Vaccination 09/24/2019, 10/23/2019  . Pneumococcal Conjugate-13 06/10/2018  . Pneumococcal Polysaccharide-23 03/16/2014, 08/17/2015, 11/01/2020  . Tdap 09/29/2011, 07/23/2019  . Zoster Recombinat (Shingrix) 07/15/2018, 03/24/2019    Health Maintenance  Topic Date Due  . OPHTHALMOLOGY EXAM  02/09/2021  . COVID-19 Vaccine (4 - Booster for Moderna series) 02/16/2021  . INFLUENZA VACCINE  04/10/2021  . HEMOGLOBIN A1C  04/30/2021  . FOOT EXAM  05/19/2021  . COLONOSCOPY (Pts 45-83yrs Insurance coverage will need to be confirmed)  04/11/2028  . TETANUS/TDAP  07/22/2029  . Hepatitis C Screening  Completed  . PNA vac Low Risk Adult  Completed  . HPV VACCINES  Aged Out       Assessment  This is a routine wellness examination for John [name].  Health Maintenance: Due or Overdue There are no preventive care reminders to display for this John [name].  John [name] does not need a referral for Community Assistance: Care Management:   no Social Work:    no Prescription Assistance:  no Nutrition/Diabetes Education:  no   Plan:  Personalized Goals Goals Addressed              This Visit's Progress     John [name] Stated   .  Exercise 3x per week (60 min per time) (pt-stated)   Not on track     Walk 3 times per week for 60 minutes.   John [name] has been started on a new medication named Entresto. This has been prescribed by Dr. Percival Spanish. The medication is causing fatigue which has resulted in John [name] not being able to walk as much as he used to. John [name] has a follow up with Dr. Percival Spanish this week to discuss.      Other   .  DIET -  INCREASE WATER INTAKE   Not on track     Try to drink 6-8 glasses of water daily.      Personalized Health Maintenance & Screening Recommendations   Lung Cancer Screening Recommended: no (Low Dose CT Chest recommended if Age 53-80 years, 30 pack-year currently smoking OR have quit w/in past 15 years) Hepatitis C Screening recommended: no HIV Screening recommended: no  Advanced Directives: Written information was not prepared per John [name]'s request.  Referrals & Orders No orders of the defined types were placed in this encounter.   Follow-up Plan . Follow-up with Claretta Fraise, MD as planned . Schedule 01/02/21    I have personally reviewed and noted the following in the John [name]'s chart:   . Medical and social history . Use of alcohol, tobacco or illicit drugs  . Current medications and supplements . Functional ability and status . Nutritional  status . Physical activity . Advanced directives . List of other physicians . Hospitalizations, surgeries, and ER visits in previous 12 months . Vitals . Screenings to include cognitive, depression, and falls . Referrals and appointments  In addition, I have reviewed and discussed with John [name] certain preventive protocols, quality metrics, and best practice recommendations. A written personalized care plan for preventive services as well as general preventive health recommendations is available and can be mailed to the John [name] at his request.      John [name] [facility]  1/32/4401

## 2021-01-02 ENCOUNTER — Other Ambulatory Visit: Payer: Self-pay

## 2021-01-02 ENCOUNTER — Encounter: Payer: Self-pay | Admitting: Family Medicine

## 2021-01-02 ENCOUNTER — Ambulatory Visit (INDEPENDENT_AMBULATORY_CARE_PROVIDER_SITE_OTHER): Payer: PPO | Admitting: Family Medicine

## 2021-01-02 VITALS — BP 102/54 | HR 74 | Temp 97.0°F | Ht 71.0 in | Wt 203.6 lb

## 2021-01-02 DIAGNOSIS — I482 Chronic atrial fibrillation, unspecified: Secondary | ICD-10-CM

## 2021-01-02 DIAGNOSIS — N183 Chronic kidney disease, stage 3 unspecified: Secondary | ICD-10-CM | POA: Diagnosis not present

## 2021-01-02 DIAGNOSIS — Z95810 Presence of automatic (implantable) cardiac defibrillator: Secondary | ICD-10-CM | POA: Diagnosis not present

## 2021-01-02 DIAGNOSIS — Z9889 Other specified postprocedural states: Secondary | ICD-10-CM

## 2021-01-02 DIAGNOSIS — I5023 Acute on chronic systolic (congestive) heart failure: Secondary | ICD-10-CM | POA: Diagnosis not present

## 2021-01-02 DIAGNOSIS — Z7901 Long term (current) use of anticoagulants: Secondary | ICD-10-CM | POA: Diagnosis not present

## 2021-01-02 LAB — COAGUCHEK XS/INR WAIVED
INR: 2.1 — ABNORMAL HIGH (ref 0.9–1.1)
Prothrombin Time: 25.2 s

## 2021-01-02 NOTE — Progress Notes (Signed)
Cardiology Office Note   Date:  01/04/2021   ID:  Arn, Mcomber 1950-11-24, MRN [id number]  PCP:  Claretta Fraise, MD  Cardiologist:   Minus Breeding, MD   Chief Complaint  Patient presents with  . Coronary Artery Disease      History of Present Illness: John [name] is a 70 y.o. male who presents for follow of MVR and atrial fib.  In April 2018 an echo demonstrated a stable MVR and stable mild aortic valve disease.  However, the EF was moderately reduced.  His EF at the previous echo was 50 although it had been reduced in the more distant past.  It was now 30 - 35% in Oct of last year.  I sent him for a Lexiscan Myoview in 2018.  He had a reduced EF with a new anterior wall perfusion defect that was not present on the previous perfusion study.  He underwent cardiac cath.  He had an occluded SVG to the RCA but flow through the native vessel.  The LAD was occluded but with good flow through the LIMA.  The patient was admitted for unstable angina in October 2021.  Cardiac catheterization performed on 06/23/2020 revealed 85% ostial left circumflex lesion, 100% mid LAD occlusion, 30% proximal RCA lesion, 80% proximal to distal RCA lesion, 100% occlusion in SVG to distal RCA, patent LIMA to LAD, 70% proximal to mid LAD lesion.  Th left circumflex lesion was treated with a Resolute Onyx 2.75 x 26 mm DES on the following day with staged PCI.  Postprocedure, he was started on aspirin and Plavix.  Imdur was discontinued due to hypotension.  He was also started on Entresto.  His Imdur has been held and restarted with caution secondary to hypotension.  [name] has been held and then restarted at a lower dose secondary to CKD.    In March he had increased SOB and was seen by Kerin Ransom PA (retired).    He had increased Lasix.  He also was taking [place] and said he just did not feel well on this.  He is now on Invokana and actually feels much better.  He is taking higher dose of Lasix 40 mg  instead of 20.  He says he is felt the best he has done in about 6 months.  He plays pool and cards.  He is not doing as much other exercises that I would like.  However, with his activities of daily living he denies any cardiovascular symptoms. The patient denies any new symptoms such as chest discomfort, neck or arm discomfort. There has been no new shortness of breath, PND or orthopnea. There have been no reported palpitations, presyncope or syncope.   Past Medical History:  Diagnosis Date  . Abnormal nuclear stress test 05/10/2017  . Allergy   . Anemia    "lost blood w/the cancer"  . Arthritis    back   . Atrial fibrillation ([place])    takes Coumadin daily  . Bruises easily    takes Coumadin daily  . CAD (coronary artery disease)    a. s/p CABG 11/2000 (L-LAD, S-RCA);  b. Lexiscan Myoview 11/12: EF 45%, ischemia and possibly some scar at the base of the inferolateral wall;   c. Lex MV 11/13:  EF 44%, Inf and IL scar/soft tissue atten, small mild amt of inf ischemia,  . CHF (congestive heart failure) ([place])   . Constipation    takes Colace daily  . 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 Enlarged prostate   . Flu 09/2013  . GERD (gastroesophageal reflux disease)    takes Nexium daily  . Glaucoma   . History of blood transfusion    "related to cancer"  . History of colon polyps   . HLD (hyperlipidemia)    takes Vytorin daily  . HTN (hypertension)    takes Amlodipine,Metoprolol, and Ramipril daily  . Insomnia    states he has always been like this.But doesn't take any meds  . Kidney stones    "I passed them all"  . Nocturia   . Peripheral edema    takes Furosemide daily  . Peripheral neuropathy   . Rheumatic fever ~ 1965  . Rheumatic heart disease    a. s/p mechanical (St. Jude) MVR 2002;  b. Echo 5/11: Mild LVH, EF 45-50%, mild AS, mild AI, mean gradient 11 mmHg, MVR with normal gradients, moderate LAE, mild RAE;  c.  Echo 11/13:   mod LVH, EF 55-60%, mild AS (mean 18  mmHg), mild AI, severe LAE, mild RAE, PASP 41  . 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 SOB (shortness of breath)    with exertion  . Type II diabetes mellitus ([place])    takes Januvia,Glipizide,and Metformin daily as well as Lantus  . Unstable angina ([place]) 06/21/2020    Past Surgical History:  Procedure Laterality Date  . APPLICATION OF WOUND VAC  2014  . CARDIAC CATHETERIZATION  2002  . COLONOSCOPY    . CORONARY ARTERY BYPASS GRAFT  2002   x 3  . CORONARY ARTERY BYPASS GRAFT    . CORONARY ATHERECTOMY N/A 06/24/2020   Procedure: CORONARY ATHERECTOMY;  Surgeon: [name], MD;  Location: [facility];  Service: Cardiovascular;  Laterality: N/A;  . CORONARY BALLOON ANGIOPLASTY N/A 06/24/2020   Procedure: CORONARY BALLOON ANGIOPLASTY;  Surgeon: [name], MD;  Location: [facility];  Service: Cardiovascular;  Laterality: N/A;  . CORONARY STENT INTERVENTION N/A 06/24/2020   Procedure: CORONARY STENT INTERVENTION;  Surgeon: [name], MD;  Location: [facility];  Service: Cardiovascular;  Laterality: N/A;  . ESOPHAGOGASTRODUODENOSCOPY    . HERNIA REPAIR    . ICD IMPLANT N/A 02/19/2020   Procedure: ICD IMPLANT;  Surgeon: Evans Lance, MD;  Location: [facility];  Service: Cardiovascular;  Laterality: N/A;  . INCISIONAL HERNIA REPAIR N/A 03/15/2014   Procedure: LAPAROSCOPIC INCISIONAL HERNIA;  Surgeon: Harl Bowie, MD;  Location: [facility];  Service: General;  Laterality: N/A;  . INSERTION OF MESH N/A 03/15/2014   Procedure: INSERTION OF MESH;  Surgeon: Harl Bowie, MD;  Location: [facility];  Service: General;  Laterality: N/A;  . LAPAROSCOPIC INCISIONAL / UMBILICAL / [place]  03/15/2014   IHR  . LAPAROTOMY N/A 02/09/2013   Procedure: EXPLORATORY LAPAROTOMY WITH incisional biopsy intra- ABDOMINAL MASS ILOCECTOMY ;  Surgeon: Harl Bowie, MD;  Location: [facility];  Service: General;   Laterality: N/A;  . MITRAL VALVE REPLACEMENT  11/2000   #33 St. Jude mechanical mitral valve prosthesis. Dr. Servando Snare  . PORT-A-CATH REMOVAL  03/15/2014  . PORT-A-CATH REMOVAL Left 03/15/2014   Procedure: REMOVAL PORT-A-CATH;  Surgeon: Harl Bowie, MD;  Location: [facility];  Service: General;  Laterality: Left;  . PORTACATH PLACEMENT N/A 02/09/2013   Procedure: INSERTION PORT-A-CATH;  Surgeon: Harl Bowie, MD;  Location: [facility];  Service: General;  Laterality: N/A;  .  RIGHT/LEFT HEART CATH AND CORONARY/GRAFT ANGIOGRAPHY N/A 05/10/2017   Procedure: RIGHT/LEFT HEART CATH AND CORONARY/GRAFT ANGIOGRAPHY;  Surgeon: Martinique, Peter M, MD;  Location: [facility];  Service: Cardiovascular;  Laterality: N/A;  . RIGHT/LEFT HEART CATH AND CORONARY/GRAFT ANGIOGRAPHY N/A 06/23/2020   Procedure: RIGHT/LEFT HEART CATH AND CORONARY/GRAFT ANGIOGRAPHY;  Surgeon: Troy Sine, MD;  Location: [facility];  Service: Cardiovascular;  Laterality: N/A;  . SEPTOPLASTY    . SMALL INTESTINE SURGERY       Current Outpatient Medications  Medication Sig Dispense Refill  . canagliflozin (INVOKANA) 300 MG TABS tablet Take 300 mg by mouth every other day.    . clopidogrel (PLAVIX) 75 MG tablet Take 1 tablet (75 mg total) by mouth daily with breakfast. 90 tablet 3  . ferrous sulfate 325 (65 FE) MG EC tablet Take 325 mg by mouth daily.    . furosemide (LASIX) 40 MG tablet Take 1 tablet (40 mg total) by mouth daily. 90 tablet 2  . glipiZIDE (GLUCOTROL XL) 10 MG 24 hr tablet Take 1 tablet (10 mg total) by mouth daily. 90 tablet 1  . insulin glargine (LANTUS) 100 UNIT/ML injection Inject 0.7 mLs (70 Units total) into the skin at bedtime. (Patient taking differently: Inject 70 Units into the skin. Take 70 units if over 180 at bedtime) 10 mL 11  . isosorbide mononitrate (IMDUR) 30 MG 24 hr tablet Take 1 tablet (30 mg total) by mouth daily. 90 tablet 3  . metFORMIN (GLUCOPHAGE) 500 MG tablet Take 1 tablet (500 mg  total) by mouth daily with breakfast. 90 tablet 1  . metoprolol (TOPROL-XL) 200 MG 24 hr tablet TAKE 1 TABLET (200 MG TOTAL) BY MOUTH DAILY AFTER BREAKFAST. 90 tablet 0  . nitroGLYCERIN (NITROSTAT) 0.4 MG SL tablet Place 1 tablet (0.4 mg total) under the tongue every 5 (five) minutes as needed for chest pain. 50 tablet 10  . pantoprazole (PROTONIX) 40 MG tablet Take 1 tablet (40 mg total) by mouth daily. 90 tablet 3  . rosuvastatin (CRESTOR) 20 MG tablet Take 1 tablet (20 mg total) by mouth daily. 60 tablet 4  . sacubitril-valsartan (ENTRESTO) 24-26 MG Take 1 tablet by mouth 2 (two) times daily. 180 tablet 3  . sitaGLIPtin (JANUVIA) 50 MG tablet Take 1 tablet (50 mg total) by mouth daily. 90 tablet 0  . sodium bicarbonate 650 MG tablet Take 650 mg by mouth 2 (two) times daily.    Marland Kitchen warfarin (COUMADIN) 5 MG tablet Take 1 tablet (5 mg total) by mouth daily. 90 tablet 1  . glucose blood (ONETOUCH VERIO) test strip USE TO TEST TWICE A DAY 200 strip 3  . OneTouch Delica Lancets 40N MISC 1 each by Other route 2 (two) times daily. Dx E11.9 200 each 3   No current facility-administered medications for this visit.    Allergies:   Doxycycline and Farxiga [dapagliflozin]    ROS:  Please see the history of present illness.   Otherwise, review of systems are positive for none.   All other systems are reviewed and negative.    PHYSICAL EXAM: VS:  BP 118/62   Pulse 73   Ht 5\' 11"  (1.803 m)   Wt 204 lb (92.5 kg)   BMI 28.45 kg/m  , BMI Body mass index is 28.45 kg/m. GENERAL:  Well appearing NECK:  No jugular venous distention, waveform within normal limits, carotid upstroke brisk and symmetric, no bruits, no thyromegaly LUNGS:  Clear to auscultation bilaterally CHEST:  Well-healed surgical scar ICD HEART:  PMI not displaced or sustained, mechanical S1 and biological S2 within normal limits, no S3, no clicks, no rubs, no murmurs ABD:  Flat, positive bowel sounds normal in frequency in pitch, no  bruits, no rebound, no guarding, no midline pulsatile mass, no hepatomegaly, no splenomegaly EXT:  2 plus pulses throughout, no edema, no cyanosis no clubbing   EKG:  EKG is  ordered today. Atrial fibrillation, right bundle branch block, no acute ST-T wave changes.   Recent Labs: 06/20/2020: B Natriuretic Peptide 2,291.0 10/31/2020: ALT 15; BUN 57; Creatinine, Ser 1.70; Hemoglobin 12.0; Platelets 205; Potassium 5.2; Sodium 145    Lipid Panel    Component Value Date/Time   CHOL 96 (L) 10/31/2020 0808   TRIG 98 10/31/2020 0808   HDL 26 (L) 10/31/2020 0808   CHOLHDL 3.7 10/31/2020 0808   LDLCALC 51 10/31/2020 0808   LDLDIRECT 88 09/23/2017 1419      Wt Readings from Last 3 Encounters:  01/04/21 204 lb (92.5 kg)  01/02/21 203 lb 9.6 oz (92.4 kg)  11/30/20 205 lb 9.6 oz (93.3 kg)      Other studies Reviewed: Additional studies/ records that were reviewed today include: Labs Review of the above records demonstrates:  Please see elsewhere in the note.     ASSESSMENT AND PLAN:  CAD s/p CABG:   The patient has no new sypmtoms.  No further cardiovascular testing is indicated.  We will continue with aggressive risk reduction and meds as listed.  Of note given the extent of his disease I am going to continue Plavix along with his warfarin.  Chronic combined systolic and diastolic heart failure:    He seems to be euvolemic.  He has had an increased dose of Lasix and close follow-up on his creatinine.  He is actually seeing a nephrologist soon.  Of note he had to be taken off a higher dose of Entresto because of hyperkalemia so I will not try to titrate this further.   Mechanical mitral valve: He is up-to-date with anticoagulation.  He understands endocarditis prophylaxis.  History of ICD:    He is up-to-date with follow-up and I reviewed these records.   Chronic atrial fibrillation:   He has good rate control and he tolerates anticoagulation.  No change in therapy.   Hypertension:    His blood pressure is at target.  He does not tolerate up titration of his medications.    Hyperlipidemia:   His LDL was 51 with an HDL of 26.  No change in therapy.   DM: A1c is 7.0.  He will continue the meds as listed.   Current medicines are reviewed at length with the patient today.  The patient does not have concerns regarding medicines.  The following changes have been made:  None  Labs/ tests ordered today include: none   Orders Placed This Encounter  Procedures  . EKG 12-Lead     Disposition:   FU with me in 6 months in [place].     Signed, Minus Breeding, MD  01/04/2021 9:39 AM    [facility]

## 2021-01-02 NOTE — Progress Notes (Signed)
Subjective:  Patient ID: John [name], male    DOB: [birth year]  Age: 70 y.o. MRN: [id number]  CC: Atrial Fibrillation   HPI John [name] presents for Atrial fibrillation follow up. Pt. is treated with rate control and anticoagulation. Pt.  denies palpitations, rapid rate, chest pain, dyspnea and edema. There has been no bleeding from nose or gums. Pt. has not noticed blood with urine or stool.  Although there is routine bruising easily, it is not excessive.  Patient reports that she has been eating hotdogs with slough frequently this month.  John [name] realizes this law is a great vegetable with high vitamin K levels so John [name] feels that is the cause of his drop in INR.   Depression screen [facility] 2/9 01/02/2021 12/27/2020 11/30/2020  Decreased Interest 0 0 0  Down, Depressed, Hopeless 0 0 0  PHQ - 2 Score 0 0 0  Altered sleeping - - -  Tired, decreased energy - - -  Change in appetite - - -  Feeling bad or failure about yourself  - - -  Trouble concentrating - - -  Moving slowly or fidgety/restless - - -  Suicidal thoughts - - -  PHQ-9 Score - - -  Some recent data might be hidden    History John [name] has a past medical history of Abnormal nuclear stress test (05/10/2017), Allergy, Anemia, Arthritis, Atrial fibrillation ([place]), Bruises easily, CAD (coronary artery disease), CHF (congestive heart failure) ([place]), Constipation, Contrast dye induced nephropathy (07/01/2020), Enlarged prostate, Flu (09/2013), GERD (gastroesophageal reflux disease), Glaucoma, History of blood transfusion, History of colon polyps, HLD (hyperlipidemia), HTN (hypertension), Insomnia, Kidney stones, Nocturia, Peripheral edema, Peripheral neuropathy, Rheumatic fever (~ 1965), Rheumatic heart disease, S/P MVR (mitral valve replacement) (11/2000), Small bowel cancer ([place]) (2014), SOB (shortness of breath), Type II diabetes mellitus ([place]), and Unstable angina ([place]) (06/21/2020).   John [name] has a past surgical history that includes Mitral  valve replacement (11/2000); laparotomy (N/A, 02/09/2013); Portacath placement (N/A, 02/09/2013); Coronary artery bypass graft (2002); Septoplasty; Application if wound vac (2014); Colonoscopy; Esophagogastroduodenoscopy; Laparoscopic incisional / umbilical / ventral hernia repair (03/15/2014); Port-a-cath removal (03/15/2014); Hernia repair; Cardiac catheterization (2002); Incisional hernia repair (N/A, 03/15/2014); Port-a-cath removal (Left, 03/15/2014); Insertion of mesh (N/A, 03/15/2014); RIGHT/LEFT HEART CATH AND CORONARY/GRAFT ANGIOGRAPHY (N/A, 05/10/2017); Coronary artery bypass graft; Small intestine surgery; ICD IMPLANT (N/A, 02/19/2020); RIGHT/LEFT HEART CATH AND CORONARY/GRAFT ANGIOGRAPHY (N/A, 06/23/2020); CORONARY STENT INTERVENTION (N/A, 06/24/2020); CORONARY ATHERECTOMY (N/A, 06/24/2020); and CORONARY BALLOON ANGIOPLASTY (N/A, 06/24/2020).   His family history includes Arthritis in his brother; Depression in his brother; Diabetes in his brother, brother, and mother; Heart disease in his brother and brother; Hyperlipidemia in his brother and brother; Hypertension in his brother, brother, and father; Thyroid disease in his sister.John [name] reports that John [name] has quit smoking. His smoking use included cigars. John [name] quit after 40.00 years of use. John [name] has never used smokeless tobacco. John [name] reports that John [name] does not drink alcohol and does not use drugs.    ROS Review of Systems  Constitutional: Positive for fatigue (exercise intolerance). Negative for fever.  Respiratory: Negative for shortness of breath.   Cardiovascular: Negative for chest pain.  Musculoskeletal: Negative for arthralgias.  Skin: Negative for rash.    Objective:  BP (!) 102/54   Pulse 74   Temp (!) 97 F (36.1 C)   Ht 5\' 11"  (1.803 m)   Wt 203 lb 9.6 oz (92.4 kg)   SpO2 100%   BMI 28.40 kg/m   BP Readings from  Last 3 Encounters:  01/02/21 (!) 102/54  11/30/20 (!) 97/54  11/08/20 (!) 108/58    Wt Readings from Last 3 Encounters:  01/02/21  203 lb 9.6 oz (92.4 kg)  11/30/20 205 lb 9.6 oz (93.3 kg)  11/08/20 210 lb 6.4 oz (95.4 kg)     Physical Exam Vitals reviewed.  Constitutional:      Appearance: John [name] is well-developed.  HENT:     Head: Normocephalic and atraumatic.     Right Ear: External ear normal.     Left Ear: External ear normal.     Mouth/Throat:     Pharynx: No oropharyngeal exudate or posterior oropharyngeal erythema.  Eyes:     Pupils: Pupils are equal, round, and reactive to light.  Cardiovascular:     Rate and Rhythm: Normal rate and regular rhythm.     Heart sounds: No murmur heard.   Pulmonary:     Effort: No respiratory distress.     Breath sounds: Normal breath sounds.  Musculoskeletal:     Cervical back: Normal range of motion and neck supple.  Neurological:     Mental Status: John [name] is alert and oriented to person, place, and time.       Assessment & Plan:   John [name] was seen today for atrial fibrillation.  Diagnoses and all orders for this visit:  Chronic atrial fibrillation ([place]) -     CoaguChek XS/INR Waived  ICD (implantable cardioverter-defibrillator) in place  Acute on chronic systolic congestive heart failure ([place])  Long term (current) use of anticoagulants [Z79.01]  MITRAL VALVE REPLACEMENT, HX OF       I am having John [name] maintain his nitroGLYCERIN, OneTouch Delica Lancets 41O, OneTouch Verio, warfarin, glipiZIDE, sitaGLIPtin, rosuvastatin, pantoprazole, clopidogrel, insulin glargine, isosorbide mononitrate, metFORMIN, Entresto, metoprolol, furosemide, and canagliflozin.  Allergies as of 01/02/2021      Reactions   Doxycycline Hives   Farxiga [dapagliflozin]    Can not tolerate       Medication List       Accurate as of January 02, 2021  2:08 PM. If you have any questions, ask your nurse or doctor.        canagliflozin 300 MG Tabs tablet Commonly known as: INVOKANA Take 300 mg by mouth every other day.   clopidogrel 75 MG tablet Commonly known as:  PLAVIX Take 1 tablet (75 mg total) by mouth daily with breakfast.   Entresto 24-26 MG Generic drug: sacubitril-valsartan Take 1 tablet by mouth 2 (two) times daily.   furosemide 40 MG tablet Commonly known as: LASIX Take 1 tablet (40 mg total) by mouth daily.   glipiZIDE 10 MG 24 hr tablet Commonly known as: GLUCOTROL XL Take 1 tablet (10 mg total) by mouth daily.   insulin glargine 100 UNIT/ML injection Commonly known as: LANTUS Inject 0.7 mLs (70 Units total) into the skin at bedtime. What changed:   when to take this  additional instructions   isosorbide mononitrate 30 MG 24 hr tablet Commonly known as: IMDUR Take 1 tablet (30 mg total) by mouth daily.   metFORMIN 500 MG tablet Commonly known as: GLUCOPHAGE Take 1 tablet (500 mg total) by mouth daily with breakfast.   metoprolol 200 MG 24 hr tablet Commonly known as: TOPROL-XL TAKE 1 TABLET (200 MG TOTAL) BY MOUTH DAILY AFTER BREAKFAST.   nitroGLYCERIN 0.4 MG SL tablet Commonly known as: NITROSTAT Place 1 tablet (0.4 mg total) under the tongue every 5 (five) minutes as needed for chest pain.  OneTouch Delica Lancets 33O Misc 1 each by Other route 2 (two) times daily. Dx E11.9   OneTouch Verio test strip Generic drug: glucose blood USE TO TEST TWICE A DAY   pantoprazole 40 MG tablet Commonly known as: PROTONIX Take 1 tablet (40 mg total) by mouth daily.   rosuvastatin 20 MG tablet Commonly known as: CRESTOR Take 1 tablet (20 mg total) by mouth daily.   sitaGLIPtin 50 MG tablet Commonly known as: Januvia Take 1 tablet (50 mg total) by mouth daily.   warfarin 5 MG tablet Commonly known as: COUMADIN Take as directed by the anticoagulation clinic. If you are unsure how to take this medication, talk to your nurse or doctor. Original instructions: Take 1 tablet (5 mg total) by mouth daily.      We discussed dietary implications for his INR with the CABG from the slaw causing it to drop.  We also  discussed the salt content of the dog itself and its potential for exacerbating his heart failure.  Follow-up: Return in about 1 month (around 02/01/2021).  Claretta Fraise, M.D.

## 2021-01-04 ENCOUNTER — Ambulatory Visit: Payer: PPO | Admitting: Cardiology

## 2021-01-04 ENCOUNTER — Encounter: Payer: Self-pay | Admitting: Cardiology

## 2021-01-04 ENCOUNTER — Other Ambulatory Visit: Payer: Self-pay

## 2021-01-04 VITALS — BP 118/62 | HR 73 | Ht 71.0 in | Wt 204.0 lb

## 2021-01-04 DIAGNOSIS — I519 Heart disease, unspecified: Secondary | ICD-10-CM | POA: Diagnosis not present

## 2021-01-04 DIAGNOSIS — I25118 Atherosclerotic heart disease of native coronary artery with other forms of angina pectoris: Secondary | ICD-10-CM

## 2021-01-04 DIAGNOSIS — I4819 Other persistent atrial fibrillation: Secondary | ICD-10-CM

## 2021-01-04 DIAGNOSIS — Z952 Presence of prosthetic heart valve: Secondary | ICD-10-CM | POA: Diagnosis not present

## 2021-01-04 DIAGNOSIS — I10 Essential (primary) hypertension: Secondary | ICD-10-CM | POA: Diagnosis not present

## 2021-01-04 NOTE — Patient Instructions (Signed)
Medication Instructions:  The current medical regimen is effective;  continue present plan and medications.  *If you need a refill on your cardiac medications before your next appointment, please call your pharmacy*  Follow-Up: At [facility], you and your health needs are our priority.  As part of our continuing mission to provide you with exceptional heart care, we have created designated Provider Care Teams.  These Care Teams include your primary Cardiologist (physician) and Advanced Practice Providers (APPs -  Physician Assistants and Nurse Practitioners) who all work together to provide you with the care you need, when you need it.  We recommend signing up for the patient portal called "MyChart".  Sign up information is provided on this After Visit Summary.  MyChart is used to connect with patients for Virtual Visits (Telemedicine).  Patients are able to view lab/test results, encounter notes, upcoming appointments, etc.  Non-urgent messages can be sent to your provider as well.   To learn more about what you can do with MyChart, go to [email or web address].    Your next appointment:   6 month(s)  The format for your next appointment:   In Person  Provider:   James Hochrein, MD   Thank you for choosing [facility]!!     

## 2021-01-11 DIAGNOSIS — N2581 Secondary hyperparathyroidism of renal origin: Secondary | ICD-10-CM | POA: Diagnosis not present

## 2021-01-11 DIAGNOSIS — I5033 Acute on chronic diastolic (congestive) heart failure: Secondary | ICD-10-CM | POA: Diagnosis not present

## 2021-01-11 DIAGNOSIS — E875 Hyperkalemia: Secondary | ICD-10-CM | POA: Diagnosis not present

## 2021-01-11 DIAGNOSIS — D631 Anemia in chronic kidney disease: Secondary | ICD-10-CM | POA: Diagnosis not present

## 2021-01-11 DIAGNOSIS — N183 Chronic kidney disease, stage 3 unspecified: Secondary | ICD-10-CM | POA: Diagnosis not present

## 2021-01-11 DIAGNOSIS — E872 Acidosis: Secondary | ICD-10-CM | POA: Diagnosis not present

## 2021-01-11 DIAGNOSIS — I129 Hypertensive chronic kidney disease with stage 1 through stage 4 chronic kidney disease, or unspecified chronic kidney disease: Secondary | ICD-10-CM | POA: Diagnosis not present

## 2021-01-11 DIAGNOSIS — E785 Hyperlipidemia, unspecified: Secondary | ICD-10-CM | POA: Diagnosis not present

## 2021-01-11 DIAGNOSIS — R778 Other specified abnormalities of plasma proteins: Secondary | ICD-10-CM | POA: Diagnosis not present

## 2021-01-21 ENCOUNTER — Other Ambulatory Visit: Payer: Self-pay | Admitting: Family Medicine

## 2021-01-21 DIAGNOSIS — Z7901 Long term (current) use of anticoagulants: Secondary | ICD-10-CM

## 2021-01-21 DIAGNOSIS — I4811 Longstanding persistent atrial fibrillation: Secondary | ICD-10-CM

## 2021-01-21 DIAGNOSIS — Z9889 Other specified postprocedural states: Secondary | ICD-10-CM

## 2021-01-31 ENCOUNTER — Other Ambulatory Visit: Payer: Self-pay | Admitting: Family Medicine

## 2021-01-31 ENCOUNTER — Other Ambulatory Visit: Payer: Self-pay

## 2021-01-31 ENCOUNTER — Other Ambulatory Visit: Payer: PPO

## 2021-01-31 DIAGNOSIS — I10 Essential (primary) hypertension: Secondary | ICD-10-CM

## 2021-01-31 DIAGNOSIS — Z9889 Other specified postprocedural states: Secondary | ICD-10-CM | POA: Diagnosis not present

## 2021-01-31 DIAGNOSIS — Z7901 Long term (current) use of anticoagulants: Secondary | ICD-10-CM

## 2021-01-31 DIAGNOSIS — Z794 Long term (current) use of insulin: Secondary | ICD-10-CM | POA: Diagnosis not present

## 2021-01-31 DIAGNOSIS — E119 Type 2 diabetes mellitus without complications: Secondary | ICD-10-CM

## 2021-01-31 DIAGNOSIS — I255 Ischemic cardiomyopathy: Secondary | ICD-10-CM

## 2021-01-31 DIAGNOSIS — I25118 Atherosclerotic heart disease of native coronary artery with other forms of angina pectoris: Secondary | ICD-10-CM

## 2021-01-31 DIAGNOSIS — I4811 Longstanding persistent atrial fibrillation: Secondary | ICD-10-CM

## 2021-01-31 LAB — BAYER DCA HB A1C WAIVED: HB A1C (BAYER DCA - WAIVED): 6.8 % (ref <7.0)

## 2021-02-01 ENCOUNTER — Ambulatory Visit (INDEPENDENT_AMBULATORY_CARE_PROVIDER_SITE_OTHER): Payer: PPO | Admitting: Family Medicine

## 2021-02-01 ENCOUNTER — Encounter: Payer: Self-pay | Admitting: Family Medicine

## 2021-02-01 VITALS — BP 105/52 | HR 70 | Temp 97.2°F | Ht 71.0 in | Wt 210.6 lb

## 2021-02-01 DIAGNOSIS — Z7901 Long term (current) use of anticoagulants: Secondary | ICD-10-CM

## 2021-02-01 DIAGNOSIS — E119 Type 2 diabetes mellitus without complications: Secondary | ICD-10-CM | POA: Diagnosis not present

## 2021-02-01 DIAGNOSIS — Z794 Long term (current) use of insulin: Secondary | ICD-10-CM

## 2021-02-01 LAB — CBC WITH DIFFERENTIAL/PLATELET
Basophils Absolute: 0.1 10*3/uL (ref 0.0–0.2)
Basos: 1 %
EOS (ABSOLUTE): 0.2 10*3/uL (ref 0.0–0.4)
Eos: 3 %
Hematocrit: 43.9 % (ref 37.5–51.0)
Hemoglobin: 13.7 g/dL (ref 13.0–17.7)
Immature Grans (Abs): 0 10*3/uL (ref 0.0–0.1)
Immature Granulocytes: 0 %
Lymphocytes Absolute: 1.1 10*3/uL (ref 0.7–3.1)
Lymphs: 19 %
MCH: 27 pg (ref 26.6–33.0)
MCHC: 31.2 g/dL — ABNORMAL LOW (ref 31.5–35.7)
MCV: 87 fL (ref 79–97)
Monocytes Absolute: 0.9 10*3/uL (ref 0.1–0.9)
Monocytes: 15 %
Neutrophils Absolute: 3.7 10*3/uL (ref 1.4–7.0)
Neutrophils: 62 %
Platelets: 149 10*3/uL — ABNORMAL LOW (ref 150–450)
RBC: 5.07 x10E6/uL (ref 4.14–5.80)
RDW: 19.8 % — ABNORMAL HIGH (ref 11.6–15.4)
WBC: 5.9 10*3/uL (ref 3.4–10.8)

## 2021-02-01 LAB — COAGUCHEK XS/INR WAIVED
INR: 2.1 — ABNORMAL HIGH (ref 0.9–1.1)
Prothrombin Time: 24.9 s

## 2021-02-01 LAB — CMP14+EGFR
ALT: 21 IU/L (ref 0–44)
AST: 25 IU/L (ref 0–40)
Albumin/Globulin Ratio: 1.9 (ref 1.2–2.2)
Albumin: 4.3 g/dL (ref 3.8–4.8)
Alkaline Phosphatase: 157 IU/L — ABNORMAL HIGH (ref 44–121)
BUN/Creatinine Ratio: 35 — ABNORMAL HIGH (ref 10–24)
BUN: 62 mg/dL — ABNORMAL HIGH (ref 8–27)
Bilirubin Total: 0.7 mg/dL (ref 0.0–1.2)
CO2: 24 mmol/L (ref 20–29)
Calcium: 8.9 mg/dL (ref 8.6–10.2)
Chloride: 103 mmol/L (ref 96–106)
Creatinine, Ser: 1.77 mg/dL — ABNORMAL HIGH (ref 0.76–1.27)
Globulin, Total: 2.3 g/dL (ref 1.5–4.5)
Glucose: 78 mg/dL (ref 65–99)
Potassium: 4.4 mmol/L (ref 3.5–5.2)
Sodium: 142 mmol/L (ref 134–144)
Total Protein: 6.6 g/dL (ref 6.0–8.5)
eGFR: 42 mL/min/{1.73_m2} — ABNORMAL LOW (ref >59)

## 2021-02-01 MED ORDER — METFORMIN HCL 500 MG PO TABS: 500.0000 mg | ORAL_TABLET | Freq: Every day | ORAL | 1 refills | Status: DC

## 2021-02-01 NOTE — Progress Notes (Signed)
Subjective:  Patient ID: John [name],  male    DOB: 04-Feb-1951  Age: 70 y.o.    CC: Atrial Fibrillation   HPI John [name] presents for  follow-up of hypertension. Patient has no history of headache chest pain or shortness of breath or recent cough. Patient also denies symptoms of TIA such as numbness weakness lateralizing. Patient denies side effects from medication. States taking it regularly.  Patient also  in for follow-up of elevated cholesterol. Doing well without complaints on current medication. Denies side effects  including myalgia and arthralgia and nausea. Also in today for liver function testing. Currently no chest pain, shortness of breath or other cardiovascular related symptoms noted.  Follow-up of diabetes. Patient does check blood sugar at home. Readings run between 110 and 150 Patient denies symptoms such as excessive hunger or urinary frequency, excessive hunger, nausea No significant hypoglycemic spells noted. Medications reviewed. Pt reports taking them regularly. Pt. denies complication/adverse reaction today.    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   He has a past surgical history that includes Mitral valve replacement (11/2000);  laparotomy (N/A, 02/09/2013); Portacath placement (N/A, 02/09/2013); Coronary artery bypass graft (2002); Septoplasty; Application if wound vac (2014); Colonoscopy; Esophagogastroduodenoscopy; Laparoscopic incisional / umbilical / ventral hernia repair (03/15/2014); Port-a-cath removal (03/15/2014); Hernia repair; Cardiac catheterization (2002); Incisional hernia repair (N/A, 03/15/2014); Port-a-cath removal (Left, 03/15/2014); Insertion of mesh (N/A, 03/15/2014); RIGHT/LEFT HEART CATH AND CORONARY/GRAFT ANGIOGRAPHY (N/A, 05/10/2017); Coronary artery bypass graft; Small intestine surgery; ICD IMPLANT (N/A, 02/19/2020); RIGHT/LEFT HEART CATH AND CORONARY/GRAFT ANGIOGRAPHY (N/A, 06/23/2020); CORONARY STENT INTERVENTION (N/A, 06/24/2020); CORONARY ATHERECTOMY (N/A, 06/24/2020); and CORONARY BALLOON ANGIOPLASTY (N/A, 06/24/2020).   His family history includes Arthritis in his brother; Depression in his brother; Diabetes in his brother, brother, and mother; Heart disease in his brother and brother; Hyperlipidemia in his brother and brother; Hypertension in his brother, brother, and father; Thyroid disease in his sister.He reports that he has quit smoking. His smoking use included cigars. He quit after 40.00 years of use. He has never used smokeless tobacco. He reports that he does not drink alcohol and does not use drugs.  Current Outpatient Medications on File Prior to Visit  Medication Sig Dispense Refill  . canagliflozin (INVOKANA) 300 MG TABS tablet Take 300 mg by mouth every other day.    . clopidogrel (PLAVIX) 75 MG tablet Take 1 tablet (75 mg total) by mouth daily with breakfast. 90 tablet 3  . furosemide (LASIX) 40 MG tablet Take 1 tablet (40 mg total) by mouth daily. 90 tablet 2  . glipiZIDE (GLUCOTROL XL) 10 MG 24 hr tablet Take 1 tablet (10 mg total) by mouth daily. 90 tablet 1  . glucose blood (ONETOUCH VERIO) test strip USE TO TEST TWICE A DAY 200 strip 3  . insulin glargine (LANTUS) 100 UNIT/ML injection Inject  0.7 mLs (70 Units total) into the skin at bedtime. (Patient taking differently: Inject 70 Units into the skin. Take 70  units if over 180 at bedtime) 10 mL 11  . metoprolol (TOPROL-XL) 200 MG 24 hr tablet TAKE 1 TABLET (200 MG TOTAL) BY MOUTH DAILY AFTER BREAKFAST. 90 tablet 0  . nitroGLYCERIN (NITROSTAT) 0.4 MG SL tablet Place 1 tablet (0.4 mg total) under the tongue every 5 (five) minutes as needed for chest pain. 50 tablet 10  . OneTouch Delica Lancets 62Z MISC 1 each by Other route 2 (two) times daily. Dx E11.9 200 each 3  . pantoprazole (PROTONIX) 40 MG tablet Take 1 tablet (40 mg total) by mouth daily. 90 tablet 3  . rosuvastatin (CRESTOR) 20 MG tablet Take 1 tablet (20 mg total) by mouth daily. 60 tablet 4  . sacubitril-valsartan (ENTRESTO) 24-26 MG Take 1 tablet by mouth 2 (two) times daily. 180 tablet 3  . sitaGLIPtin (JANUVIA) 50 MG tablet Take 1 tablet (50 mg total) by mouth daily. 90 tablet 0  . sodium bicarbonate 650 MG tablet Take 650 mg by mouth 2 (two) times daily.    Marland Kitchen warfarin (COUMADIN) 5 MG tablet TAKE 1 TABLET BY MOUTH EVERY DAY 90 tablet 0  . isosorbide mononitrate (IMDUR) 30 MG 24 hr tablet Take 1 tablet (30 mg total) by mouth daily. 90 tablet 3   No current facility-administered medications on file prior to visit.    ROS Review of Systems  Constitutional: Negative for fever.  Respiratory: Negative for shortness of breath.   Cardiovascular: Negative for chest pain.  Musculoskeletal: Negative for arthralgias.  Skin: Negative for rash.    Objective:  BP (!) 105/52   Pulse 70   Temp (!) 97.2 F (36.2 C)   Ht 5\' 11"  (1.803 m)   Wt 210 lb 9.6 oz (95.5 kg)   SpO2 99%   BMI 29.37 kg/m   BP Readings from Last 3 Encounters:  02/01/21 (!) 105/52  01/04/21 118/62  01/02/21 (!) 102/54    Wt Readings from Last 3 Encounters:  02/01/21 210 lb 9.6 oz (95.5 kg)  01/04/21 204 lb (92.5 kg)  01/02/21 203 lb 9.6 oz (92.4 kg)     Physical Exam Vitals reviewed.   Constitutional:      Appearance: He is well-developed.  HENT:     Head: Normocephalic and atraumatic.     Right Ear: Tympanic membrane and external ear normal. No decreased hearing noted.     Left Ear: Tympanic membrane and external ear normal. No decreased hearing noted.     Mouth/Throat:     Pharynx: No oropharyngeal exudate or posterior oropharyngeal erythema.  Eyes:     Pupils: Pupils are equal, round, and reactive to light.  Cardiovascular:     Rate and Rhythm: Normal rate and regular rhythm.     Heart sounds: No murmur heard.   Pulmonary:     Effort: No respiratory distress.     Breath sounds: Normal breath sounds.  Abdominal:     General: Bowel sounds are normal.     Palpations: Abdomen is soft. There is no mass.     Tenderness: There is no abdominal tenderness.  Musculoskeletal:     Cervical back: Normal range of motion and neck supple.     Diabetic Foot Exam - Simple   No data filed       Assessment & Plan:   John [name] was seen today for atrial fibrillation.  Diagnoses and all orders for this visit:  Long term (current) use of anticoagulants -     CoaguChek XS/INR Waived  Type 2 diabetes  mellitus without complication, with long-term current use of insulin (HCC) -     metFORMIN (GLUCOPHAGE) 500 MG tablet; Take 1 tablet (500 mg total) by mouth daily with breakfast.   I have discontinued John [name]'s ferrous sulfate. I am also having him maintain his nitroGLYCERIN, OneTouch Delica Lancets 02X, OneTouch Verio, glipiZIDE, sitaGLIPtin, rosuvastatin, pantoprazole, clopidogrel, insulin glargine, isosorbide mononitrate, Entresto, furosemide, canagliflozin, sodium bicarbonate, warfarin, metoprolol, and metFORMIN.  Meds ordered this encounter  Medications  . metFORMIN (GLUCOPHAGE) 500 MG tablet    Sig: Take 1 tablet (500 mg total) by mouth daily with breakfast.    Dispense:  90 tablet    Refill:  1   Description            Follow-up: Return in  about 1 month (around 03/04/2021).  Claretta Fraise, M.D.

## 2021-02-17 ENCOUNTER — Ambulatory Visit (INDEPENDENT_AMBULATORY_CARE_PROVIDER_SITE_OTHER): Payer: PPO

## 2021-02-17 DIAGNOSIS — I5023 Acute on chronic systolic (congestive) heart failure: Secondary | ICD-10-CM

## 2021-02-17 DIAGNOSIS — I255 Ischemic cardiomyopathy: Secondary | ICD-10-CM

## 2021-02-17 LAB — CUP PACEART REMOTE DEVICE CHECK
Battery Remaining Longevity: 111 mo
Battery Remaining Percentage: 89 %
Battery Voltage: 2.99 V
Brady Statistic RV Percent Paced: 1.8 %
Date Time Interrogation Session: 20220610030047
HighPow Impedance: 61 Ohm
Implantable Lead Implant Date: 20210611
Implantable Lead Location: 753860
Implantable Pulse Generator Implant Date: 20210611
Lead Channel Impedance Value: 380 Ohm
Lead Channel Pacing Threshold Amplitude: 0.75 V
Lead Channel Pacing Threshold Pulse Width: 0.5 ms
Lead Channel Sensing Intrinsic Amplitude: 12 mV
Lead Channel Setting Pacing Amplitude: 2 V
Lead Channel Setting Pacing Pulse Width: 0.5 ms
Lead Channel Setting Sensing Sensitivity: 0.5 mV
Pulse Gen Serial Number: 810002163

## 2021-02-24 ENCOUNTER — Other Ambulatory Visit: Payer: Self-pay | Admitting: Family Medicine

## 2021-03-07 ENCOUNTER — Other Ambulatory Visit: Payer: Self-pay

## 2021-03-07 ENCOUNTER — Ambulatory Visit (INDEPENDENT_AMBULATORY_CARE_PROVIDER_SITE_OTHER): Payer: PPO | Admitting: Family Medicine

## 2021-03-07 ENCOUNTER — Encounter: Payer: Self-pay | Admitting: Family Medicine

## 2021-03-07 VITALS — BP 106/59 | HR 74 | Temp 96.7°F | Ht 71.0 in | Wt 209.2 lb

## 2021-03-07 DIAGNOSIS — Z7901 Long term (current) use of anticoagulants: Secondary | ICD-10-CM | POA: Diagnosis not present

## 2021-03-07 DIAGNOSIS — Z9889 Other specified postprocedural states: Secondary | ICD-10-CM

## 2021-03-07 DIAGNOSIS — I10 Essential (primary) hypertension: Secondary | ICD-10-CM

## 2021-03-07 DIAGNOSIS — I4819 Other persistent atrial fibrillation: Secondary | ICD-10-CM

## 2021-03-07 LAB — COAGUCHEK XS/INR WAIVED
INR: 2.6 — ABNORMAL HIGH (ref 0.9–1.1)
Prothrombin Time: 31 s

## 2021-03-07 NOTE — Progress Notes (Signed)
Subjective:  Patient ID: John [name], male    DOB: 1951-03-15  Age: 69 y.o. MRN: [id number]  CC: Atrial Fibrillation   HPI John [name] presents for Atrial fibrillation follow up. Pt. is treated with rate control and anticoagulation. Pt.  denies palpitations, rapid rate, chest pain, dyspnea and edema. There has been no bleeding from nose or gums. Pt. has not noticed blood with urine or stool.  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  Also has implanted defibrillator, mitral valve replacement and CHF. He has had no symptoms. No activation of the defib. HE has to walk slowly due to dyspnea.   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   He has a past surgical history that includes Mitral valve replacement (11/2000); laparotomy (N/A, 02/09/2013); Portacath placement (N/A, 02/09/2013); Coronary artery bypass graft (2002); Septoplasty; Application if wound vac (2014); Colonoscopy; Esophagogastroduodenoscopy; Laparoscopic incisional / umbilical / ventral hernia repair (03/15/2014); Port-a-cath removal (03/15/2014); Hernia repair; Cardiac catheterization (2002); Incisional hernia repair (N/A, 03/15/2014); Port-a-cath removal (Left, 03/15/2014);  Insertion of mesh (N/A, 03/15/2014); RIGHT/LEFT HEART CATH AND CORONARY/GRAFT ANGIOGRAPHY (N/A, 05/10/2017); Coronary artery bypass graft; Small intestine surgery; ICD IMPLANT (N/A, 02/19/2020); RIGHT/LEFT HEART CATH AND CORONARY/GRAFT ANGIOGRAPHY (N/A, 06/23/2020); CORONARY STENT INTERVENTION (N/A, 06/24/2020); CORONARY ATHERECTOMY (N/A, 06/24/2020); and CORONARY BALLOON ANGIOPLASTY (N/A, 06/24/2020).   His family history includes Arthritis in his brother; Depression in his brother; Diabetes in his brother, brother, and mother; Heart disease in his brother and brother; Hyperlipidemia in his brother and brother; Hypertension in his brother, brother, and father; Thyroid disease in his sister.He reports that he has quit smoking. His smoking use included cigars. He has never used smokeless tobacco. He reports that he does not drink alcohol and does not use drugs.    ROS Review of Systems  Constitutional:  Negative for fever.  Respiratory:  Negative for shortness of breath.   Cardiovascular:  Negative for chest pain.  Musculoskeletal:  Negative for arthralgias.  Skin:  Negative for rash.   Objective:  BP (!) 106/59   Pulse 74   Temp (!) 96.7 F (35.9 C)   Ht 5\' 11"  (1.803 m)   Wt 209 lb 3.2 oz (94.9 kg)   SpO2 100%   BMI 29.18 kg/m   BP Readings from Last 3 Encounters:  03/07/21 (!) 106/59  02/01/21 (!) 105/52  01/04/21 118/62    Wt Readings from Last 3 Encounters:  03/07/21 209 lb 3.2 oz (94.9 kg)  02/01/21 210 lb 9.6 oz (95.5 kg)  01/04/21 204 lb (92.5 kg)     Physical Exam Vitals reviewed.  Constitutional:      Appearance: He is well-developed.  HENT:     Head: Normocephalic and atraumatic.     Right Ear: External ear normal.     Left Ear: External ear normal.     Mouth/Throat:     Pharynx: No oropharyngeal exudate or posterior oropharyngeal  erythema.  Eyes:     Pupils: Pupils are equal, round, and reactive to light.  Cardiovascular:     Rate and Rhythm: Normal rate and  regular rhythm.     Heart sounds: No murmur heard. Pulmonary:     Effort: No respiratory distress.     Breath sounds: Normal breath sounds.  Musculoskeletal:     Cervical back: Normal range of motion and neck supple.  Neurological:     Mental Status: He is alert and oriented to person, place, and time.      Assessment & Plan:   John [name] was seen today for atrial fibrillation.  Diagnoses and all orders for this visit:  Long term (current) use of anticoagulants -     CoaguChek XS/INR Waived  Persistent atrial fibrillation (HCC)  Primary hypertension  MITRAL VALVE REPLACEMENT, HX OF      I am having John [name] maintain his nitroGLYCERIN, OneTouch Verio, glipiZIDE, sitaGLIPtin, rosuvastatin, pantoprazole, clopidogrel, insulin glargine, isosorbide mononitrate, Entresto, furosemide, canagliflozin, sodium bicarbonate, warfarin, metoprolol, metFORMIN, and OneTouch Delica Lancets 95J.  Allergies as of 03/07/2021       Reactions   Doxycycline Hives   Farxiga [dapagliflozin]    Can not tolerate         Medication List        Accurate as of March 07, 2021  9:08 AM. If you have any questions, ask your nurse or doctor.          canagliflozin 300 MG Tabs tablet Commonly known as: INVOKANA Take 300 mg by mouth every other day.   clopidogrel 75 MG tablet Commonly known as: PLAVIX Take 1 tablet (75 mg total) by mouth daily with breakfast.   Entresto 24-26 MG Generic drug: sacubitril-valsartan Take 1 tablet by mouth 2 (two) times daily.   furosemide 40 MG tablet Commonly known as: LASIX Take 1 tablet (40 mg total) by mouth daily.   glipiZIDE 10 MG 24 hr tablet Commonly known as: GLUCOTROL XL Take 1 tablet (10 mg total) by mouth daily.   insulin glargine 100 UNIT/ML injection Commonly known as: LANTUS Inject 0.7 mLs (70 Units total) into the skin at bedtime. What changed:  when to take this additional instructions   isosorbide mononitrate 30 MG 24 hr  tablet Commonly known as: IMDUR Take 1 tablet (30 mg total) by mouth daily.   metFORMIN 500 MG tablet Commonly known as: GLUCOPHAGE Take 1 tablet (500 mg total) by mouth daily with breakfast.   metoprolol 200 MG 24 hr tablet Commonly known as: TOPROL-XL TAKE 1 TABLET (200 MG TOTAL) BY MOUTH DAILY AFTER BREAKFAST.   nitroGLYCERIN 0.4 MG SL tablet Commonly known as: NITROSTAT Place 1 tablet (0.4 mg total) under the tongue every 5 (five) minutes as needed for chest pain.   OneTouch Delica Lancets 88C Misc TEST 2 TIMES DAILY AS DIRECTED   OneTouch Verio test strip Generic drug: glucose blood USE TO TEST TWICE A DAY   pantoprazole 40 MG tablet Commonly known as: PROTONIX Take 1 tablet (40 mg total) by mouth daily.   rosuvastatin 20 MG tablet Commonly known as: CRESTOR Take 1 tablet (20 mg total) by mouth daily.   sitaGLIPtin 50 MG tablet Commonly known as: Januvia Take 1 tablet (50 mg total) by mouth daily.   sodium bicarbonate 650 MG tablet Take 650 mg by mouth 2 (two) times daily.   warfarin 5 MG tablet Commonly known as: COUMADIN Take as directed by the anticoagulation clinic. If you are unsure  how to take this medication, talk to your nurse or doctor. Original instructions: TAKE 1 TABLET BY MOUTH EVERY DAY         Follow-up: Return in about 1 month (around 04/06/2021).  Claretta Fraise, M.D.

## 2021-03-10 NOTE — Progress Notes (Signed)
Remote ICD transmission.   

## 2021-03-18 ENCOUNTER — Other Ambulatory Visit: Payer: Self-pay | Admitting: Medical

## 2021-03-18 ENCOUNTER — Other Ambulatory Visit: Payer: Self-pay | Admitting: Family Medicine

## 2021-03-18 DIAGNOSIS — Z794 Long term (current) use of insulin: Secondary | ICD-10-CM

## 2021-03-18 DIAGNOSIS — E119 Type 2 diabetes mellitus without complications: Secondary | ICD-10-CM

## 2021-04-03 ENCOUNTER — Ambulatory Visit (INDEPENDENT_AMBULATORY_CARE_PROVIDER_SITE_OTHER): Payer: PPO | Admitting: Family Medicine

## 2021-04-03 ENCOUNTER — Encounter: Payer: Self-pay | Admitting: Family Medicine

## 2021-04-03 ENCOUNTER — Other Ambulatory Visit: Payer: Self-pay

## 2021-04-03 VITALS — BP 100/62 | HR 68 | Temp 97.6°F | Ht 71.0 in | Wt 214.2 lb

## 2021-04-03 DIAGNOSIS — Z9889 Other specified postprocedural states: Secondary | ICD-10-CM

## 2021-04-03 DIAGNOSIS — Z7901 Long term (current) use of anticoagulants: Secondary | ICD-10-CM

## 2021-04-03 DIAGNOSIS — I4811 Longstanding persistent atrial fibrillation: Secondary | ICD-10-CM | POA: Diagnosis not present

## 2021-04-03 LAB — COAGUCHEK XS/INR WAIVED
INR: 3.5 — ABNORMAL HIGH (ref 0.9–1.1)
Prothrombin Time: 42.3 s

## 2021-04-03 MED ORDER — WARFARIN SODIUM 5 MG PO TABS: 5.0000 mg | ORAL_TABLET | Freq: Every day | ORAL | 3 refills | Status: DC

## 2021-04-03 NOTE — Progress Notes (Signed)
Subjective:  Patient ID: John [name], male    DOB: [birth year]  Age: 70 y.o. MRN: [id number]  CC: Atrial Fibrillation   HPI John [name] presents for Atrial fibrillation follow up. Pt. is treated with rate control and anticoagulation.  He is also anticoagulated for his mechanical heart valve.  Additionally, due to stents, he takes plavix and coumadin based on cardiology recommendation. Pt.  denies palpitations, rapid rate, chest pain, dyspnea and edema. There has been no bleeding from nose or gums. Pt. has not noticed blood with urine or stool.  Although there is routine bruising easily, it is not excessive.   Depression screen [facility] 2/9 04/03/2021 03/07/2021 03/07/2021  Decreased Interest 0 0 0  Down, Depressed, Hopeless 0 0 0  PHQ - 2 Score 0 0 0  Altered sleeping 0 1 -  Tired, decreased energy 1 1 -  Change in appetite 0 0 -  Feeling bad or failure about yourself  0 0 -  Trouble concentrating 0 0 -  Moving slowly or fidgety/restless 0 0 -  Suicidal thoughts 0 0 -  PHQ-9 Score 1 2 -  Difficult doing work/chores Not difficult at all Not difficult at all -  Some recent data might be hidden    History John [name] has a past medical history of Abnormal nuclear stress test (05/10/2017), Allergy, Anemia, Arthritis, Atrial fibrillation ([place]), Bruises easily, CAD (coronary artery disease), CHF (congestive heart failure) ([place]), Constipation, 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   He has a past surgical history  that includes Mitral valve replacement (11/2000); laparotomy (N/A, 02/09/2013); Portacath placement (N/A, 02/09/2013); Coronary artery bypass graft (2002); Septoplasty; Application if wound vac (2014); Colonoscopy; Esophagogastroduodenoscopy; Laparoscopic incisional / umbilical / ventral hernia repair (03/15/2014); Port-a-cath removal (03/15/2014); Hernia repair; Cardiac catheterization (2002); Incisional hernia repair (N/A, 03/15/2014); Port-a-cath removal (Left, 03/15/2014); Insertion of mesh (N/A, 03/15/2014); RIGHT/LEFT HEART CATH AND CORONARY/GRAFT ANGIOGRAPHY (N/A, 05/10/2017); Coronary artery bypass graft; Small intestine surgery; ICD IMPLANT (N/A, 02/19/2020); RIGHT/LEFT HEART CATH AND CORONARY/GRAFT ANGIOGRAPHY (N/A, 06/23/2020); CORONARY STENT INTERVENTION (N/A, 06/24/2020); CORONARY ATHERECTOMY (N/A, 06/24/2020); and CORONARY BALLOON ANGIOPLASTY (N/A, 06/24/2020).   His family history includes Arthritis in his brother; Depression in his brother; Diabetes in his brother, brother, and mother; Heart disease in his brother and brother; Hyperlipidemia in his brother and brother; Hypertension in his brother, brother, and father; Thyroid disease in his sister.He reports that he has quit smoking. His smoking use included cigars. He has never used smokeless tobacco. He reports that he does not drink alcohol and does not use drugs.    ROS Review of Systems  Constitutional:  Negative for fever.  Respiratory:  Negative for shortness of breath.   Cardiovascular:  Negative for chest pain.  Musculoskeletal:  Negative for arthralgias.  Skin:  Negative for rash.  Hematological:  Does not bruise/bleed easily.   Objective:  BP 100/62   Pulse 68   Temp 97.6 F (36.4 C)   Ht 5\' 11"  (1.803 m)   Wt 214 lb 3.2 oz (97.2 kg)   SpO2 99%   BMI 29.87 kg/m   BP Readings from Last 3 Encounters:  04/03/21 100/62  03/07/21 (!) 106/59  02/01/21 (!) 105/52    Wt Readings from Last 3 Encounters:  04/03/21 214 lb 3.2 oz (97.2  kg)  03/07/21 209 lb 3.2 oz (94.9 kg)  02/01/21 210 lb 9.6 oz (95.5 kg)     Physical Exam Vitals reviewed.  Constitutional:      Appearance: He is well-developed.  HENT:     Head: Normocephalic and atraumatic.     Right Ear: External ear normal.     Left Ear: External ear normal.     Mouth/Throat:     Pharynx: No oropharyngeal exudate or posterior oropharyngeal erythema.  Eyes:     Pupils: Pupils are equal, round, and reactive to light.  Cardiovascular:     Rate and Rhythm: Normal rate and regular rhythm.     Heart sounds: No murmur heard.    Comments: Occasional skipped beat Pulmonary:     Effort: No respiratory distress.     Breath sounds: Normal breath sounds.  Musculoskeletal:     Cervical back: Normal range of motion and neck supple.  Neurological:     Mental Status: He is alert and oriented to person, place, and time.      Assessment & Plan:   John [name] was seen today for atrial fibrillation.  Diagnoses and all orders for this visit:  Longstanding persistent atrial fibrillation (HCC) -     warfarin (COUMADIN) 5 MG tablet; Take 1 tablet (5 mg total) by mouth daily.  Long term (current) use of anticoagulants -     CoaguChek XS/INR Waived -     warfarin (COUMADIN) 5 MG tablet; Take 1 tablet (5 mg total) by mouth daily.  MITRAL VALVE REPLACEMENT, HX OF -     warfarin (COUMADIN) 5 MG tablet; Take 1 tablet (5 mg total) by mouth daily.      I have changed John [name]'s warfarin. I am also having him maintain his nitroGLYCERIN, OneTouch Verio, sitaGLIPtin, pantoprazole, clopidogrel, insulin glargine, isosorbide mononitrate, Entresto, furosemide, canagliflozin, sodium bicarbonate, metoprolol, metFORMIN, OneTouch Delica Lancets 43P, glipiZIDE, and rosuvastatin.  Allergies as of 04/03/2021       Reactions   Doxycycline Hives   Farxiga [dapagliflozin]    Can not tolerate         Medication List        Accurate as of April 03, 2021  9:03 AM. If you have  any questions, ask your nurse or doctor.          canagliflozin 300 MG Tabs tablet Commonly known as: INVOKANA Take 300 mg by mouth every other day.   clopidogrel 75 MG tablet Commonly known as: PLAVIX Take 1 tablet (75 mg total) by mouth daily with breakfast.   Entresto 24-26 MG Generic drug: sacubitril-valsartan Take 1 tablet by mouth 2 (two) times daily.   furosemide 40 MG tablet Commonly known as: LASIX Take 1 tablet (40 mg total) by mouth daily.   glipiZIDE 10 MG 24 hr tablet Commonly known as: GLUCOTROL XL TAKE 1 TABLET BY MOUTH EVERY DAY   insulin glargine 100 UNIT/ML injection Commonly known as: LANTUS Inject 0.7 mLs (70 Units total) into the skin at bedtime. What changed:  when to take this additional instructions   isosorbide mononitrate 30 MG 24 hr tablet Commonly known as: IMDUR Take 1 tablet (30 mg total) by mouth daily.   metFORMIN 500 MG tablet Commonly known as: GLUCOPHAGE Take 1 tablet (500 mg total) by mouth daily with breakfast.   metoprolol 200 MG 24 hr  tablet Commonly known as: TOPROL-XL TAKE 1 TABLET (200 MG TOTAL) BY MOUTH DAILY AFTER BREAKFAST.   nitroGLYCERIN 0.4 MG SL tablet Commonly known as: NITROSTAT Place 1 tablet (0.4 mg total) under the tongue every 5 (five) minutes as needed for chest pain.   OneTouch Delica Lancets 14Y Misc TEST 2 TIMES DAILY AS DIRECTED   OneTouch Verio test strip Generic drug: glucose blood USE TO TEST TWICE A DAY   pantoprazole 40 MG tablet Commonly known as: PROTONIX Take 1 tablet (40 mg total) by mouth daily.   rosuvastatin 20 MG tablet Commonly known as: CRESTOR TAKE 1 TABLET BY MOUTH EVERY DAY   sitaGLIPtin 50 MG tablet Commonly known as: Januvia Take 1 tablet (50 mg total) by mouth daily.   sodium bicarbonate 650 MG tablet Take 650 mg by mouth 2 (two) times daily.   warfarin 5 MG tablet Commonly known as: COUMADIN Take as directed by the anticoagulation clinic. If you are unsure how to  take this medication, talk to your nurse or doctor. Original instructions: Take 1 tablet (5 mg total) by mouth daily.         Follow-up: Return in about 1 month (around 05/04/2021).  John [name], M.D.

## 2021-04-16 ENCOUNTER — Other Ambulatory Visit: Payer: Self-pay | Admitting: Medical

## 2021-04-19 DIAGNOSIS — N183 Chronic kidney disease, stage 3 unspecified: Secondary | ICD-10-CM | POA: Diagnosis not present

## 2021-04-19 DIAGNOSIS — E785 Hyperlipidemia, unspecified: Secondary | ICD-10-CM | POA: Diagnosis not present

## 2021-04-19 DIAGNOSIS — D631 Anemia in chronic kidney disease: Secondary | ICD-10-CM | POA: Diagnosis not present

## 2021-04-19 DIAGNOSIS — I129 Hypertensive chronic kidney disease with stage 1 through stage 4 chronic kidney disease, or unspecified chronic kidney disease: Secondary | ICD-10-CM | POA: Diagnosis not present

## 2021-04-19 DIAGNOSIS — E875 Hyperkalemia: Secondary | ICD-10-CM | POA: Diagnosis not present

## 2021-04-19 DIAGNOSIS — N2581 Secondary hyperparathyroidism of renal origin: Secondary | ICD-10-CM | POA: Diagnosis not present

## 2021-04-19 DIAGNOSIS — R809 Proteinuria, unspecified: Secondary | ICD-10-CM | POA: Diagnosis not present

## 2021-04-29 ENCOUNTER — Other Ambulatory Visit: Payer: Self-pay | Admitting: Family Medicine

## 2021-04-29 DIAGNOSIS — I10 Essential (primary) hypertension: Secondary | ICD-10-CM

## 2021-04-29 DIAGNOSIS — I255 Ischemic cardiomyopathy: Secondary | ICD-10-CM

## 2021-04-29 DIAGNOSIS — I25118 Atherosclerotic heart disease of native coronary artery with other forms of angina pectoris: Secondary | ICD-10-CM

## 2021-05-08 ENCOUNTER — Ambulatory Visit: Payer: PPO | Admitting: Family Medicine

## 2021-05-08 ENCOUNTER — Ambulatory Visit (INDEPENDENT_AMBULATORY_CARE_PROVIDER_SITE_OTHER): Payer: PPO | Admitting: Family Medicine

## 2021-05-08 ENCOUNTER — Other Ambulatory Visit: Payer: Self-pay

## 2021-05-08 ENCOUNTER — Encounter: Payer: Self-pay | Admitting: Family Medicine

## 2021-05-08 VITALS — BP 120/64 | HR 71 | Temp 96.8°F | Resp 18 | Ht 71.0 in | Wt 211.5 lb

## 2021-05-08 DIAGNOSIS — Z7901 Long term (current) use of anticoagulants: Secondary | ICD-10-CM

## 2021-05-08 DIAGNOSIS — Z9889 Other specified postprocedural states: Secondary | ICD-10-CM

## 2021-05-08 DIAGNOSIS — I4819 Other persistent atrial fibrillation: Secondary | ICD-10-CM

## 2021-05-08 LAB — COAGUCHEK XS/INR WAIVED
INR: 3.3 — ABNORMAL HIGH (ref 0.9–1.1)
Prothrombin Time: 39.8 s

## 2021-05-08 LAB — POCT INR: INR: 3.3 — AB (ref 2.0–3.0)

## 2021-05-08 NOTE — Progress Notes (Signed)
Subjective:  Patient ID: John [name], male    DOB: 1951/01/18, 70 y.o.   MRN: [id number]  Patient Care Team: Claretta Fraise, MD as PCP - General (Family Medicine) Minus Breeding, MD as PCP - Cardiology (Cardiology) Evans Lance, MD as PCP - Electrophysiology (Cardiology) Wyatt Portela, MD as Consulting Physician (Oncology) Minus Breeding, MD as Consulting Physician (Cardiology)   Chief Complaint:  Coagulation Disorder   HPI: John [name] is a 70 y.o. male presenting on 05/08/2021 for Coagulation Disorder   Pt presents today for INR recheck. He is s/p MV replacement and has chronic A-Fib, INR goal 2.5-3.5. He does bruise easily but states not more than normal. He denies chest pain, lower extremity swelling, worsening dyspnea with exertion, orthopnea, PND, or near syncope/syncope. He is followed by cardiology on a regular basis and has an upcoming appointment.    Relevant past medical, surgical, family, and social history reviewed and updated as indicated.  Allergies and medications reviewed and updated. Data reviewed: Chart in Epic.   Past Medical History:  Diagnosis Date   Abnormal nuclear stress test 05/10/2017   Allergy    Anemia    "lost blood w/the cancer"   Arthritis    back    Atrial fibrillation (HCC)    takes Coumadin daily   Bruises easily    takes Coumadin daily   CAD (coronary artery disease)    a. s/p CABG 11/2000 (L-LAD, S-RCA);  b. Lexiscan Myoview 11/12: EF 45%, ischemia and possibly some scar at the base of the inferolateral wall;   c. Lex MV 11/13:  EF 44%, Inf and IL scar/soft tissue atten, small mild amt of inf ischemia,   CHF (congestive heart failure) (HCC)    Constipation    takes Colace daily   Contrast dye induced nephropathy 07/01/2020   Enlarged prostate    Flu 09/2013   GERD (gastroesophageal reflux disease)    takes Nexium daily   Glaucoma    History of blood transfusion    "related to cancer"   History of colon polyps     HLD (hyperlipidemia)    takes Vytorin daily   HTN (hypertension)    takes Amlodipine,Metoprolol, and Ramipril daily   Insomnia    states he has always been like this.But doesn't take any meds   Kidney stones    "I passed them all"   Nocturia    Peripheral edema    takes Furosemide daily   Peripheral neuropathy    Rheumatic fever ~ 1965   Rheumatic heart disease    a. s/p mechanical (St. Jude) MVR 2002;  b. Echo 5/11: Mild LVH, EF 45-50%, mild AS, mild AI, mean gradient 11 mmHg, MVR with normal gradients, moderate LAE, mild RAE;  c.  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   SOB (shortness of breath)    with exertion   Type II diabetes mellitus ([place])    takes Januvia,Glipizide,and Metformin daily as well as Lantus   Unstable angina ([place]) 06/21/2020    Past Surgical History:  Procedure Laterality Date   APPLICATION OF WOUND VAC  2014   CARDIAC CATHETERIZATION  2002   COLONOSCOPY     CORONARY ARTERY BYPASS GRAFT  2002   x 3   CORONARY ARTERY BYPASS GRAFT  CORONARY ATHERECTOMY N/A 06/24/2020   Procedure: CORONARY ATHERECTOMY;  Surgeon: [name], MD;  Location: [place] [facility];  Service: Cardiovascular;  Laterality: N/A;   CORONARY BALLOON ANGIOPLASTY N/A 06/24/2020   Procedure: CORONARY BALLOON ANGIOPLASTY;  Surgeon: [name], MD;  Location: [place] [facility];  Service: Cardiovascular;  Laterality: N/A;   CORONARY STENT INTERVENTION N/A 06/24/2020   Procedure: CORONARY STENT INTERVENTION;  Surgeon: [name], MD;  Location: [place] [facility];  Service: Cardiovascular;  Laterality: N/A;   ESOPHAGOGASTRODUODENOSCOPY     HERNIA REPAIR     ICD IMPLANT N/A 02/19/2020   Procedure: ICD IMPLANT;  Surgeon: Evans Lance, MD;  Location: [place] [facility];  Service: Cardiovascular;  Laterality: N/A;   INCISIONAL HERNIA REPAIR  N/A 03/15/2014   Procedure: LAPAROSCOPIC INCISIONAL HERNIA;  Surgeon: Harl Bowie, MD;  Location: [place];  Service: General;  Laterality: N/A;   INSERTION OF MESH N/A 03/15/2014   Procedure: INSERTION OF MESH;  Surgeon: Harl Bowie, MD;  Location: [place];  Service: General;  Laterality: N/A;   LAPAROSCOPIC INCISIONAL / UMBILICAL / [place]  03/15/2014   IHR   LAPAROTOMY N/A 02/09/2013   Procedure: EXPLORATORY LAPAROTOMY WITH incisional biopsy intra- ABDOMINAL MASS ILOCECTOMY ;  Surgeon: Harl Bowie, MD;  [facility];  Service: General;  Laterality: N/A;   MITRAL VALVE REPLACEMENT  11/2000   #33 St. Jude mechanical mitral valve prosthesis. Dr. Servando Snare   [facility] REMOVAL  03/15/2014   PORT-A-CATH REMOVAL Left 03/15/2014   Procedure: REMOVAL PORT-A-CATH;  Surgeon: Harl Bowie, MD;  Location: [place];  Service: General;  Laterality: Left;   PORTACATH PLACEMENT N/A 02/09/2013   Procedure: INSERTION PORT-A-CATH;  Surgeon: Harl Bowie, MD;  [facility];  Service: General;  Laterality: N/A;   RIGHT/LEFT HEART CATH AND CORONARY/GRAFT ANGIOGRAPHY N/A 05/10/2017   Procedure: RIGHT/LEFT HEART CATH AND CORONARY/GRAFT ANGIOGRAPHY;  Surgeon: Martinique, Peter M, MD;  Location: [place] [facility];  Service: Cardiovascular;  Laterality: N/A;   RIGHT/LEFT HEART CATH AND CORONARY/GRAFT ANGIOGRAPHY N/A 06/23/2020   Procedure: RIGHT/LEFT HEART CATH AND CORONARY/GRAFT ANGIOGRAPHY;  Surgeon: Troy Sine, MD;  Location: [place] [facility];  Service: Cardiovascular;  Laterality: N/A;   SEPTOPLASTY     SMALL INTESTINE SURGERY      Social History   Socioeconomic History   Marital status: Married    Spouse name: Marcie Bal   Number of children: 1   Years of education: 12   Highest education level: High school graduate  Occupational History   Occupation: disabled  Tobacco Use   Smoking status: Former    Types: Cigars   Smokeless tobacco: Never  Vaping Use   Vaping Use:  Never used  Substance and Sexual Activity   Alcohol use: No    Comment: "drank for 30 years; quit ~ 2000"   Drug use: No    Types: Marijuana    Comment: quit in 2000 'reefer'   Sexual activity: Yes  Other Topics Concern   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   Social Determinants of Health   Financial Resource Strain: Not on file  Food Insecurity: Not on file  Transportation Needs: Not on file  Physical Activity: Not on file  Stress: Not on file  Social Connections: Not on file  Intimate Partner Violence: Not on file    Outpatient Encounter Medications as of 05/08/2021  Medication Sig  canagliflozin (INVOKANA) 300 MG TABS tablet Take 300 mg by mouth every other day.   clopidogrel (PLAVIX) 75 MG tablet TAKE 1 TABLET BY MOUTH DAILY WITH BREAKFAST.   furosemide (LASIX) 40 MG tablet Take 1 tablet (40 mg total) by mouth daily.   glipiZIDE (GLUCOTROL XL) 10 MG 24 hr tablet TAKE 1 TABLET BY MOUTH EVERY DAY   glucose blood (ONETOUCH VERIO) test strip USE TO TEST TWICE A DAY   insulin glargine (LANTUS) 100 UNIT/ML injection Inject 0.7 mLs (70 Units total) into the skin at bedtime. (Patient taking differently: Inject 70 Units into the skin. Take 70 units if over 180 at bedtime)   metFORMIN (GLUCOPHAGE) 500 MG tablet Take 1 tablet (500 mg total) by mouth daily with breakfast.   metoprolol (TOPROL-XL) 200 MG 24 hr tablet TAKE 1 TABLET (200 MG TOTAL) BY MOUTH DAILY AFTER BREAKFAST.   nitroGLYCERIN (NITROSTAT) 0.4 MG SL tablet Place 1 tablet (0.4 mg total) under the tongue every 5 (five) minutes as needed for chest pain.   OneTouch Delica Lancets 32T MISC TEST 2 TIMES DAILY AS DIRECTED   pantoprazole (PROTONIX) 40 MG tablet Take 1 tablet (40 mg total) by mouth daily.   rosuvastatin (CRESTOR) 20 MG tablet TAKE 1 TABLET BY MOUTH EVERY DAY   sacubitril-valsartan (ENTRESTO) 24-26 MG Take 1 tablet by mouth 2 (two) times daily.   sitaGLIPtin  (JANUVIA) 50 MG tablet Take 1 tablet (50 mg total) by mouth daily.   sodium bicarbonate 650 MG tablet Take 650 mg by mouth 2 (two) times daily.   warfarin (COUMADIN) 5 MG tablet Take 1 tablet (5 mg total) by mouth daily.   isosorbide mononitrate (IMDUR) 30 MG 24 hr tablet Take 1 tablet (30 mg total) by mouth daily.   No facility-administered encounter medications on file as of 05/08/2021.    Allergies  Allergen Reactions   Doxycycline Hives   Farxiga [Dapagliflozin]     Can not tolerate     Review of Systems  Constitutional:  Negative for activity change, appetite change, chills, diaphoresis, fatigue, fever and unexpected weight change.  HENT: Negative.    Eyes: Negative.   Respiratory:  Positive for shortness of breath (with significant exertion). Negative for apnea, cough, choking, chest tightness, wheezing and stridor.   Cardiovascular:  Negative for chest pain, palpitations and leg swelling.  Gastrointestinal:  Negative for abdominal distention, abdominal pain, anal bleeding, blood in stool, constipation, diarrhea, nausea, rectal pain and vomiting.  Endocrine: Negative.   Genitourinary:  Negative for decreased urine volume, difficulty urinating, dysuria, frequency, hematuria and urgency.  Musculoskeletal:  Negative for arthralgias and myalgias.  Skin: Negative.   Allergic/Immunologic: Negative.   Neurological:  Negative for dizziness and headaches.  Hematological:  Bruises/bleeds easily.  Psychiatric/Behavioral:  Negative for confusion, hallucinations, sleep disturbance and suicidal ideas.   All other systems reviewed and are negative.      Objective:  BP 120/64   Pulse 71   Temp (!) 96.8 F (36 C) (Temporal)   Resp 18   Ht 5\' 11"  (1.803 m)   Wt 211 lb 8 oz (95.9 kg)   HC 20" (50.8 cm)   SpO2 99%   BMI 29.50 kg/m    Wt Readings from Last 3 Encounters:  05/08/21 211 lb 8 oz (95.9 kg)  04/03/21 214 lb 3.2 oz (97.2 kg)  03/07/21 209 lb 3.2 oz (94.9 kg)    Physical  Exam Vitals and nursing note reviewed.  Constitutional:      General:  He is not in acute distress.    Appearance: Normal appearance. He is well-developed, well-groomed and overweight. He is not ill-appearing, toxic-appearing or diaphoretic.  HENT:     Head: Normocephalic and atraumatic.     Jaw: There is normal jaw occlusion.     Right Ear: Hearing normal.     Left Ear: Hearing normal.     Nose: Nose normal.     Mouth/Throat:     Lips: Pink.     Mouth: Mucous membranes are moist.     Pharynx: Oropharynx is clear. Uvula midline.  Eyes:     General: Lids are normal.     Extraocular Movements: Extraocular movements intact.     Conjunctiva/sclera: Conjunctivae normal.     Pupils: Pupils are equal, round, and reactive to light.  Neck:     Thyroid: No thyroid mass, thyromegaly or thyroid tenderness.     Vascular: No carotid bruit or JVD.     Trachea: Trachea and phonation normal.  Cardiovascular:     Rate and Rhythm: Normal rate. Rhythm irregularly irregular.     Chest Wall: PMI is not displaced.     Pulses: Normal pulses.     Heart sounds: No murmur heard.   No friction rub. No gallop.     Comments: Click from Mitral Valve replacement present.  ICD pocket left chest. Pulmonary:     Effort: Pulmonary effort is normal. No respiratory distress.     Breath sounds: Normal breath sounds. No wheezing.  Abdominal:     General: Bowel sounds are normal. There is no distension or abdominal bruit.     Palpations: Abdomen is soft. There is no hepatomegaly or splenomegaly.     Tenderness: There is no abdominal tenderness. There is no right CVA tenderness or left CVA tenderness.     Hernia: No hernia is present.  Musculoskeletal:        General: Normal range of motion.     Cervical back: Normal range of motion and neck supple.     Right lower leg: No edema.     Left lower leg: No edema.  Lymphadenopathy:     Cervical: No cervical adenopathy.  Skin:    General: Skin is warm and dry.      Capillary Refill: Capillary refill takes less than 2 seconds.     Coloration: Skin is not cyanotic, jaundiced or pale.     Findings: No rash.  Neurological:     General: No focal deficit present.     Mental Status: He is alert and oriented to person, place, and time.     Cranial Nerves: Cranial nerves are intact.     Sensory: Sensation is intact.     Motor: Motor function is intact.     Coordination: Coordination is intact.     Gait: Gait is intact.     Deep Tendon Reflexes: Reflexes are normal and symmetric.  Psychiatric:        Attention and Perception: Attention and perception normal.        Mood and Affect: Mood and affect normal.        Speech: Speech normal.        Behavior: Behavior normal. Behavior is cooperative.        Thought Content: Thought content normal.        Cognition and Memory: Cognition and memory normal.        Judgment: Judgment normal.    Results for orders placed or performed in visit on  05/08/21  POCT INR  Result Value Ref Range   INR 3.3 (A) 2.0 - 3.0       Pertinent labs & imaging results that were available during my care of the patient were reviewed by me and considered in my medical decision making.  Assessment & Plan:  Ezell was seen today for coagulation disorder.  Diagnoses and all orders for this visit:  Persistent atrial fibrillation ([place]) MITRAL VALVE REPLACEMENT, HX OF Long term (current) use of anticoagulants INR 3.3 today, at goal. Repeat in 4 weeks. Follow up with cardiology as scheduled.  -     CoaguChek XS/INR Waived -     POCT INR    Continue all other maintenance medications.  Follow up plan: Return in about 4 weeks (around 06/05/2021), or if symptoms worsen or fail to improve, for INR.   Continue healthy lifestyle choices, including diet (rich in fruits, vegetables, and lean proteins, and low in salt and simple carbohydrates) and exercise (at least 30 minutes of moderate physical activity daily).  Educational handout  given for Coag Calendar  The above assessment and management plan was discussed with the patient. The patient verbalized understanding of and has agreed to the management plan. Patient is aware to call the clinic if they develop any new symptoms or if symptoms persist or worsen. Patient is aware when to return to the clinic for a follow-up visit. Patient educated on when it is appropriate to go to the emergency department.   Monia Pouch, FNP-C [facility] [phone]

## 2021-05-19 ENCOUNTER — Ambulatory Visit (INDEPENDENT_AMBULATORY_CARE_PROVIDER_SITE_OTHER): Payer: PPO

## 2021-05-19 DIAGNOSIS — I255 Ischemic cardiomyopathy: Secondary | ICD-10-CM

## 2021-05-19 DIAGNOSIS — I5023 Acute on chronic systolic (congestive) heart failure: Secondary | ICD-10-CM

## 2021-05-19 LAB — CUP PACEART REMOTE DEVICE CHECK
Battery Remaining Longevity: 109 mo
Battery Remaining Percentage: 87 %
Battery Voltage: 2.99 V
Brady Statistic RV Percent Paced: 1.7 %
Date Time Interrogation Session: 20220909020027
HighPow Impedance: 71 Ohm
Implantable Lead Implant Date: 20210611
Implantable Lead Location: 753860
Implantable Pulse Generator Implant Date: 20210611
Lead Channel Impedance Value: 390 Ohm
Lead Channel Pacing Threshold Amplitude: 0.75 V
Lead Channel Pacing Threshold Pulse Width: 0.5 ms
Lead Channel Sensing Intrinsic Amplitude: 12 mV
Lead Channel Setting Pacing Amplitude: 2 V
Lead Channel Setting Pacing Pulse Width: 0.5 ms
Lead Channel Setting Sensing Sensitivity: 0.5 mV
Pulse Gen Serial Number: 810002163

## 2021-05-24 NOTE — Progress Notes (Signed)
Remote ICD transmission.   

## 2021-05-25 DIAGNOSIS — Z23 Encounter for immunization: Secondary | ICD-10-CM | POA: Diagnosis not present

## 2021-05-26 ENCOUNTER — Ambulatory Visit (INDEPENDENT_AMBULATORY_CARE_PROVIDER_SITE_OTHER): Payer: PPO | Admitting: Internal Medicine

## 2021-05-26 ENCOUNTER — Other Ambulatory Visit: Payer: Self-pay

## 2021-05-26 VITALS — BP 116/66 | HR 73 | Ht 71.0 in | Wt 215.0 lb

## 2021-05-26 DIAGNOSIS — I255 Ischemic cardiomyopathy: Secondary | ICD-10-CM | POA: Diagnosis not present

## 2021-05-26 DIAGNOSIS — I4819 Other persistent atrial fibrillation: Secondary | ICD-10-CM | POA: Diagnosis not present

## 2021-05-26 DIAGNOSIS — I5023 Acute on chronic systolic (congestive) heart failure: Secondary | ICD-10-CM

## 2021-05-26 NOTE — Progress Notes (Signed)
HPI Mr. Timko returns today for ongoing evaluation and management. He is a pleasant 70 yo man with a h/o CAD, s/p CABG, s/p MVR remotely who also had chronic atrial fib. He underwent ICD insertion over a year ago. In the interim, he notes no chest pain or sob. No ICD therapy. He notes that his right arm was swollen after his implant, but has resolved. Allergies  Allergen Reactions   Doxycycline Hives   Farxiga [Dapagliflozin]     Can not tolerate      Current Outpatient Medications  Medication Sig Dispense Refill   canagliflozin (INVOKANA) 300 MG TABS tablet Take 300 mg by mouth every other day.     clopidogrel (PLAVIX) 75 MG tablet TAKE 1 TABLET BY MOUTH DAILY WITH BREAKFAST. 90 tablet 3   furosemide (LASIX) 40 MG tablet Take 1 tablet (40 mg total) by mouth daily. 90 tablet 2   glipiZIDE (GLUCOTROL XL) 10 MG 24 hr tablet TAKE 1 TABLET BY MOUTH EVERY DAY 90 tablet 0   glucose blood (ONETOUCH VERIO) test strip USE TO TEST TWICE A DAY 200 strip 3   insulin glargine (LANTUS) 100 UNIT/ML injection Inject 0.7 mLs (70 Units total) into the skin at bedtime. (Patient taking differently: Inject 70 Units into the skin. Take 70 units if over 180 at bedtime) 10 mL 11   isosorbide mononitrate (IMDUR) 30 MG 24 hr tablet Take 30 mg by mouth daily.     metFORMIN (GLUCOPHAGE) 500 MG tablet Take 1 tablet (500 mg total) by mouth daily with breakfast. 90 tablet 1   metoprolol (TOPROL-XL) 200 MG 24 hr tablet TAKE 1 TABLET (200 MG TOTAL) BY MOUTH DAILY AFTER BREAKFAST. 90 tablet 0   nitroGLYCERIN (NITROSTAT) 0.4 MG SL tablet Place 1 tablet (0.4 mg total) under the tongue every 5 (five) minutes as needed for chest pain. 50 tablet 10   OneTouch Delica Lancets 38H MISC TEST 2 TIMES DAILY AS DIRECTED 100 each 5   pantoprazole (PROTONIX) 40 MG tablet Take 1 tablet (40 mg total) by mouth daily. 90 tablet 3   rosuvastatin (CRESTOR) 20 MG tablet TAKE 1 TABLET BY MOUTH EVERY DAY 90 tablet 3    sacubitril-valsartan (ENTRESTO) 24-26 MG Take 1 tablet by mouth 2 (two) times daily. 180 tablet 3   sitaGLIPtin (JANUVIA) 50 MG tablet Take 1 tablet (50 mg total) by mouth daily. 90 tablet 0   sodium bicarbonate 650 MG tablet Take 650 mg by mouth 2 (two) times daily.     warfarin (COUMADIN) 5 MG tablet Take 1 tablet (5 mg total) by mouth daily. 90 tablet 3   No current facility-administered medications for this visit.     Past Medical History:  Diagnosis Date   Abnormal nuclear stress test 05/10/2017   Allergy    Anemia    "lost blood w/the cancer"   Arthritis    back    Atrial fibrillation (HCC)    takes Coumadin daily   Bruises easily    takes Coumadin daily   CAD (coronary artery disease)    a. s/p CABG 11/2000 (L-LAD, S-RCA);  b. Lexiscan Myoview 11/12: EF 45%, ischemia and possibly some scar at the base of the inferolateral wall;   c. Lex MV 11/13:  EF 44%, Inf and IL scar/soft tissue atten, small mild amt of inf ischemia,   CHF (congestive heart failure) (HCC)    Constipation    takes Colace daily   Contrast dye induced nephropathy 07/01/2020  Enlarged prostate    Flu 09/2013   GERD (gastroesophageal reflux disease)    takes Nexium daily   Glaucoma    History of blood transfusion    "related to cancer"   History of colon polyps    HLD (hyperlipidemia)    takes Vytorin daily   HTN (hypertension)    takes Amlodipine,Metoprolol, and Ramipril daily   Insomnia    states he has always been like this.But doesn't take any meds   Kidney stones    "I passed them all"   Nocturia    Peripheral edema    takes Furosemide daily   Peripheral neuropathy    Rheumatic fever ~ 1965   Rheumatic heart disease    a. s/p mechanical (St. Jude) MVR 2002;  b. Echo 5/11: Mild LVH, EF 45-50%, mild AS, mild AI, mean gradient 11 mmHg, MVR with normal gradients, moderate LAE, mild RAE;  c.  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   Past Surgical History:  Procedure Laterality Date   APPLICATION OF WOUND VAC  2014   CARDIAC CATHETERIZATION  2002   COLONOSCOPY     CORONARY ARTERY BYPASS GRAFT  2002   x 3   CORONARY ARTERY BYPASS GRAFT     CORONARY ATHERECTOMY N/A 06/24/2020   Procedure: CORONARY ATHERECTOMY;  Surgeon: [name], MD;  Location: [place] [facility];  Service: Cardiovascular;  Laterality: N/A;   CORONARY BALLOON ANGIOPLASTY N/A 06/24/2020   Procedure: CORONARY BALLOON ANGIOPLASTY;  Surgeon: [name], MD;  Location: [place] [facility];  Service: Cardiovascular;  Laterality: N/A;   CORONARY STENT INTERVENTION N/A 06/24/2020   Procedure: CORONARY STENT INTERVENTION;  Surgeon: [name], MD;  Location: [place] [facility];  Service: Cardiovascular;  Laterality: N/A;   ESOPHAGOGASTRODUODENOSCOPY     HERNIA REPAIR     ICD IMPLANT N/A 02/19/2020   Procedure: ICD IMPLANT;  Surgeon: Evans Lance, MD;  Location: [place] [facility];  Service: Cardiovascular;  Laterality: N/A;   INCISIONAL HERNIA REPAIR N/A 03/15/2014   Procedure: LAPAROSCOPIC INCISIONAL HERNIA;  Surgeon: Harl Bowie, MD;  Location: [place];  Service: General;  Laterality: N/A;   INSERTION OF MESH N/A 03/15/2014   Procedure: INSERTION OF MESH;  Surgeon: Harl Bowie, MD;  Location: [place];  Service: General;  Laterality: N/A;   LAPAROSCOPIC INCISIONAL / UMBILICAL / [place]  03/15/2014   IHR   LAPAROTOMY N/A 02/09/2013   Procedure: EXPLORATORY LAPAROTOMY WITH incisional biopsy intra- ABDOMINAL MASS ILOCECTOMY ;  Surgeon: Harl Bowie, MD;  [facility];  Service: General;   Laterality: N/A;   MITRAL VALVE REPLACEMENT  11/2000   #33 St. Jude mechanical mitral valve prosthesis. Dr. Servando Snare   [facility] REMOVAL  03/15/2014   PORT-A-CATH REMOVAL Left 03/15/2014   Procedure: REMOVAL PORT-A-CATH;  Surgeon: Harl Bowie, MD;  Location: [place];  Service: General;  Laterality: Left;   PORTACATH PLACEMENT N/A 02/09/2013   Procedure: INSERTION PORT-A-CATH;  Surgeon: Harl Bowie, MD;  [facility];  Service: General;  Laterality: N/A;   RIGHT/LEFT HEART CATH AND CORONARY/GRAFT ANGIOGRAPHY N/A 05/10/2017   Procedure: RIGHT/LEFT HEART CATH AND CORONARY/GRAFT ANGIOGRAPHY;  Surgeon: Martinique, Peter M, MD;  Location: [place] [facility];  Service: Cardiovascular;  Laterality: N/A;   RIGHT/LEFT HEART CATH AND CORONARY/GRAFT ANGIOGRAPHY N/A 06/23/2020   Procedure: RIGHT/LEFT HEART CATH AND CORONARY/GRAFT ANGIOGRAPHY;  Surgeon: Troy Sine, MD;  Location: [place] [facility];  Service: Cardiovascular;  Laterality: N/A;   SEPTOPLASTY     SMALL INTESTINE SURGERY       Family History  Problem Relation Age of Onset   Diabetes Mother    Hypertension Father    Thyroid disease Sister    Diabetes Brother    Arthritis Brother    Depression Brother    Heart disease Brother    Hyperlipidemia Brother    Hypertension Brother    Hypertension Brother    Diabetes Brother    Heart disease Brother    Hyperlipidemia Brother      Social History   Socioeconomic History   Marital status: Married    Spouse name: Marcie Bal   Number of children: 1   Years of education: 12   Highest education level: High school graduate  Occupational History   Occupation: disabled  Tobacco Use   Smoking status: Former    Types: Cigars   Smokeless tobacco: Never  Vaping Use   Vaping Use: Never used  Substance and Sexual Activity   Alcohol use: No    Comment: "drank for 30 years; quit ~ 2000"   Drug use: No    Types: Marijuana    Comment: quit in 2000 'reefer'   Sexual activity: Yes   Other Topics Concern   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   Social Determinants of Health   Financial Resource Strain: Not on file  Food Insecurity: Not on file  Transportation Needs: Not on file  Physical Activity: Not on file  Stress: Not on file  Social Connections: Not on file  Intimate Partner Violence: Not on file     BP 116/66   Pulse 73   Ht 5\' 11"  (1.803 m)   Wt 215 lb (97.5 kg)   SpO2 96%   BMI 29.99 kg/m   Physical Exam:  Well appearing NAD HEENT: Unremarkable Neck:  No JVD, no thyromegally Lymphatics:  No adenopathy Back:  No CVA tenderness Lungs:  Clear HEART:  IRegular rate rhythm, no murmurs, no rubs, no clicks; mechanical S1 Abd:  soft, positive bowel sounds, no organomegally, no rebound, no guarding Ext:  2 plus pulses, no edema, no cyanosis, no clubbing Skin:  No rashes no nodules Neuro:  CN II through XII intact, motor grossly intact   DEVICE  Normal device function.  See PaceArt for details.   Assess/Plan:  1. Chronic systolic heart failure - his symptoms remain class 2. He will continue his current meds. 2. ICM - his is s/p remote MI. He denies angina. 3. Coags - he will continue his warfarin 4. ICD - His St. Jude ICD is working normally. We will recheck in several months. 5. CAD - he is s/p stenting over a year ago. I asked to him discuss discontinuation of his plavix with Dr. Percival Spanish.    Carleene Overlie Mataeo [place],MD

## 2021-05-26 NOTE — Patient Instructions (Signed)
Medication Instructions:  Your physician recommends that you continue on your current medications as directed. Please refer to the Current Medication list given to you today.  *If you need a refill on your cardiac medications before your next appointment, please call your pharmacy*   Lab Work: None ordered   Testing/Procedures: None ordered   Follow-Up: At [facility], you and your health needs are our priority.  As part of our continuing mission to provide you with exceptional heart care, we have created designated Provider Care Teams.  These Care Teams include your primary Cardiologist (physician) and Advanced Practice Providers (APPs -  Physician Assistants and Nurse Practitioners) who all work together to provide you with the care you need, when you need it.  Remote monitoring is used to monitor your Pacemaker or ICD from home. This monitoring reduces the number of office visits required to check your device to one time per year. It allows [place] to keep an eye on the functioning of your device to ensure it is working properly. You are scheduled for a device check from home on 08/23/2021. You may send your transmission at any time that day. If you have a wireless device, the transmission will be sent automatically. After your physician reviews your transmission, you will receive a postcard with your next transmission date.  Your next appointment:   1 year(s)  The format for your next appointment:   In Person  Provider:   Cristopher Peru, MD   Thank you for choosing [facility]!!

## 2021-06-09 ENCOUNTER — Encounter: Payer: Self-pay | Admitting: Family Medicine

## 2021-06-09 ENCOUNTER — Other Ambulatory Visit: Payer: Self-pay

## 2021-06-09 ENCOUNTER — Ambulatory Visit (INDEPENDENT_AMBULATORY_CARE_PROVIDER_SITE_OTHER): Payer: PPO | Admitting: Family Medicine

## 2021-06-09 VITALS — BP 112/54 | HR 78 | Temp 96.9°F | Ht 71.0 in | Wt 214.0 lb

## 2021-06-09 DIAGNOSIS — E119 Type 2 diabetes mellitus without complications: Secondary | ICD-10-CM | POA: Diagnosis not present

## 2021-06-09 DIAGNOSIS — I4819 Other persistent atrial fibrillation: Secondary | ICD-10-CM

## 2021-06-09 DIAGNOSIS — Z794 Long term (current) use of insulin: Secondary | ICD-10-CM

## 2021-06-09 DIAGNOSIS — Z23 Encounter for immunization: Secondary | ICD-10-CM

## 2021-06-09 LAB — BAYER DCA HB A1C WAIVED: HB A1C (BAYER DCA - WAIVED): 6.5 % — ABNORMAL HIGH (ref 4.8–5.6)

## 2021-06-09 LAB — COAGUCHEK XS/INR WAIVED
INR: 3.6 — ABNORMAL HIGH (ref 0.9–1.1)
Prothrombin Time: 43.8 s

## 2021-06-09 MED ORDER — GLIPIZIDE ER 10 MG PO TB24: 10.0000 mg | ORAL_TABLET | Freq: Every day | ORAL | 3 refills | Status: DC

## 2021-06-09 MED ORDER — ISOSORBIDE MONONITRATE ER 30 MG PO TB24: 30.0000 mg | ORAL_TABLET | Freq: Every day | ORAL | 3 refills | Status: DC

## 2021-06-09 MED ORDER — PANTOPRAZOLE SODIUM 40 MG PO TBEC: 40.0000 mg | DELAYED_RELEASE_TABLET | Freq: Every day | ORAL | 3 refills | Status: DC

## 2021-06-09 NOTE — Progress Notes (Signed)
Subjective:  Patient ID: John [name], male    DOB: 06/03/1951  Age: 70 y.o. MRN: [id number]  CC: Atrial Fibrillation   HPI John [name] presents for Atrial fibrillation follow up. Pt. is treated with rate control and anticoagulation. Pt.  denies palpitations, rapid rate, chest pain, dyspnea and edema. There has been no bleeding from nose or gums. Pt. has not noticed blood with urine or stool.  Although there is routine bruising easily, it is not excessive.  presents forFollow-up of diabetes. Patient checks blood sugar at home.   100-120 fasting and 140-240 postprandial. Occasionally eats ice cream in the evening and checks blod glucose 1 hour later, may be making these numbers inaccurate. Patient denies symptoms such as polyuria, polydipsia, excessive hunger, nausea No significant hypoglycemic spells noted. Medications reviewed. Pt reports taking them regularly without complication/adverse reaction being reported today.   Depression screen [facility] 2/9 05/08/2021 04/03/2021 03/07/2021  Decreased Interest 0 0 0  Down, Depressed, Hopeless 0 0 0  PHQ - 2 Score 0 0 0  Altered sleeping 0 0 1  Tired, decreased energy 0 1 1  Change in appetite 0 0 0  Feeling bad or failure about yourself  0 0 0  Trouble concentrating 0 0 0  Moving slowly or fidgety/restless 0 0 0  Suicidal thoughts 0 0 0  PHQ-9 Score 0 1 2  Difficult doing work/chores - Not difficult at all Not difficult at all  Some recent data might be hidden    History Kysen has a past medical history of Abnormal nuclear stress test (05/10/2017), Allergy, Anemia, Arthritis, Atrial fibrillation ([place]), Bruises easily, CAD (coronary artery disease), CHF (congestive heart failure) ([place]), Constipation, 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   He has a past surgical history that includes Mitral valve replacement (11/2000); laparotomy (N/A, 02/09/2013); Portacath placement (N/A, 02/09/2013); Coronary artery bypass graft (2002); Septoplasty; Application if wound vac (2014); Colonoscopy; Esophagogastroduodenoscopy; Laparoscopic incisional / umbilical / ventral hernia repair (03/15/2014); Port-a-cath removal (03/15/2014); Hernia repair; Cardiac catheterization (2002); Incisional hernia repair (N/A, 03/15/2014); Port-a-cath removal (Left, 03/15/2014); Insertion of mesh (N/A, 03/15/2014); RIGHT/LEFT HEART CATH AND CORONARY/GRAFT ANGIOGRAPHY (N/A, 05/10/2017); Coronary artery bypass graft; Small intestine surgery; ICD IMPLANT (N/A, 02/19/2020); RIGHT/LEFT HEART CATH AND CORONARY/GRAFT ANGIOGRAPHY (N/A, 06/23/2020); CORONARY STENT INTERVENTION (N/A, 06/24/2020); CORONARY ATHERECTOMY (N/A, 06/24/2020); and CORONARY BALLOON ANGIOPLASTY (N/A, 06/24/2020).   His family history includes Arthritis in his brother; Depression in his brother; Diabetes in his brother, brother, and mother; Heart disease in his brother and brother; Hyperlipidemia in his brother and brother; Hypertension in his brother, brother, and father; Thyroid disease in his sister.He reports that he has quit smoking. His smoking use included cigars. He has never used smokeless tobacco. He reports that he does not drink alcohol and does not use drugs.    ROS Review of Systems  Constitutional: Negative.   HENT: Negative.    Eyes:  Negative for visual disturbance.  Respiratory:  Negative for cough and shortness of breath.   Cardiovascular:  Negative for chest pain and leg swelling.  Gastrointestinal:  Negative for  abdominal pain, diarrhea, nausea and vomiting.  Genitourinary:  Negative for difficulty urinating.  Musculoskeletal:  Negative for arthralgias and myalgias.  Skin:  Negative for rash.  Neurological:  Negative for headaches.  Psychiatric/Behavioral:  Negative for sleep disturbance.    Objective:  There were no vitals taken for this visit.  BP Readings from Last 3 Encounters:  05/26/21 116/66  05/08/21 120/64  04/03/21 100/62    Wt Readings from Last 3 Encounters:  05/26/21 215 lb (97.5 kg)  05/08/21 211 lb 8 oz (95.9 kg)  04/03/21 214 lb 3.2 oz (97.2 kg)     Physical Exam Vitals reviewed.  Constitutional:      Appearance: He is well-developed.  HENT:     Head: Normocephalic and atraumatic.     Right Ear: External ear normal.     Left Ear: External ear normal.     Mouth/Throat:     Pharynx: No oropharyngeal exudate or posterior oropharyngeal erythema.  Eyes:     Pupils: Pupils are equal, round, and reactive to light.  Cardiovascular:     Rate and Rhythm: Normal rate and regular rhythm.     Heart sounds: No murmur heard. Pulmonary:     Effort: No respiratory distress.     Breath sounds: Normal breath sounds.  Musculoskeletal:     Cervical back: Normal range of motion and neck supple.  Neurological:     Mental Status: He is alert and oriented to person, place, and time.   No results found for any visits on 06/09/21.    Assessment & Plan:   Zyier was seen today for atrial fibrillation.  Diagnoses and all orders for this visit:  Persistent atrial fibrillation ([place]) -     CoaguChek XS/INR Waived      I am having Cristiano L. Zimmermann maintain his nitroGLYCERIN, OneTouch Verio, sitaGLIPtin, pantoprazole, insulin glargine, Entresto, furosemide, canagliflozin, sodium bicarbonate, metFORMIN, OneTouch Delica Lancets 75I, glipiZIDE, rosuvastatin, warfarin, clopidogrel, metoprolol, and isosorbide mononitrate.  Allergies as of 06/09/2021       Reactions   Doxycycline  Hives   Farxiga [dapagliflozin]    Can not tolerate         Medication List        Accurate as of June 09, 2021 11:04 AM. If you have any questions, ask your nurse or doctor.          canagliflozin 300 MG Tabs tablet Commonly known as: INVOKANA Take 300 mg by mouth every other day.   clopidogrel 75 MG tablet Commonly known as: PLAVIX TAKE 1 TABLET BY MOUTH DAILY WITH BREAKFAST.   Entresto 24-26 MG Generic drug: sacubitril-valsartan Take 1 tablet by mouth 2 (two) times daily.   furosemide 40 MG tablet Commonly known as: LASIX Take 1 tablet (40 mg total) by mouth daily.   glipiZIDE 10 MG 24 hr tablet Commonly known as: GLUCOTROL XL TAKE 1 TABLET BY MOUTH EVERY DAY   insulin glargine 100 UNIT/ML injection Commonly known as: LANTUS Inject 0.7 mLs (70 Units total) into the skin at bedtime. What changed:  when to take this additional instructions   isosorbide mononitrate 30 MG 24 hr tablet Commonly known as: IMDUR Take 30 mg by mouth daily.   metFORMIN 500 MG tablet Commonly known as: GLUCOPHAGE Take 1 tablet (500 mg total) by mouth daily with breakfast.   metoprolol 200 MG 24 hr tablet Commonly known as: TOPROL-XL TAKE 1 TABLET (200 MG TOTAL) BY MOUTH DAILY AFTER BREAKFAST.   nitroGLYCERIN 0.4 MG SL tablet Commonly known as: NITROSTAT Place 1 tablet (0.4 mg  total) under the tongue every 5 (five) minutes as needed for chest pain.   OneTouch Delica Lancets 74U Misc TEST 2 TIMES DAILY AS DIRECTED   OneTouch Verio test strip Generic drug: glucose blood USE TO TEST TWICE A DAY   pantoprazole 40 MG tablet Commonly known as: PROTONIX Take 1 tablet (40 mg total) by mouth daily.   rosuvastatin 20 MG tablet Commonly known as: CRESTOR TAKE 1 TABLET BY MOUTH EVERY DAY   sitaGLIPtin 50 MG tablet Commonly known as: Januvia Take 1 tablet (50 mg total) by mouth daily.   sodium bicarbonate 650 MG tablet Take 650 mg by mouth 2 (two) times daily.    warfarin 5 MG tablet Commonly known as: COUMADIN Take as directed by the anticoagulation clinic. If you are unsure how to take this medication, talk to your nurse or doctor. Original instructions: Take 1 tablet (5 mg total) by mouth daily.         Follow-up: No follow-ups on file.  Claretta Fraise, M.D.

## 2021-06-10 LAB — CBC WITH DIFFERENTIAL/PLATELET
Basophils Absolute: 0.1 10*3/uL (ref 0.0–0.2)
Basos: 1 %
EOS (ABSOLUTE): 0.2 10*3/uL (ref 0.0–0.4)
Eos: 3 %
Hematocrit: 43.6 % (ref 37.5–51.0)
Hemoglobin: 13.7 g/dL (ref 13.0–17.7)
Immature Grans (Abs): 0 10*3/uL (ref 0.0–0.1)
Immature Granulocytes: 0 %
Lymphocytes Absolute: 0.9 10*3/uL (ref 0.7–3.1)
Lymphs: 14 %
MCH: 27 pg (ref 26.6–33.0)
MCHC: 31.4 g/dL — ABNORMAL LOW (ref 31.5–35.7)
MCV: 86 fL (ref 79–97)
Monocytes Absolute: 0.8 10*3/uL (ref 0.1–0.9)
Monocytes: 11 %
Neutrophils Absolute: 5 10*3/uL (ref 1.4–7.0)
Neutrophils: 71 %
Platelets: 180 10*3/uL (ref 150–450)
RBC: 5.08 x10E6/uL (ref 4.14–5.80)
RDW: 16.1 % — ABNORMAL HIGH (ref 11.6–15.4)
WBC: 6.9 10*3/uL (ref 3.4–10.8)

## 2021-06-10 LAB — CMP14+EGFR
ALT: 20 IU/L (ref 0–44)
AST: 24 IU/L (ref 0–40)
Albumin/Globulin Ratio: 1.4 (ref 1.2–2.2)
Albumin: 4.2 g/dL (ref 3.8–4.8)
Alkaline Phosphatase: 170 IU/L — ABNORMAL HIGH (ref 44–121)
BUN/Creatinine Ratio: 35 — ABNORMAL HIGH (ref 10–24)
BUN: 62 mg/dL — ABNORMAL HIGH (ref 8–27)
Bilirubin Total: 0.6 mg/dL (ref 0.0–1.2)
CO2: 23 mmol/L (ref 20–29)
Calcium: 9.2 mg/dL (ref 8.6–10.2)
Chloride: 103 mmol/L (ref 96–106)
Creatinine, Ser: 1.79 mg/dL — ABNORMAL HIGH (ref 0.76–1.27)
Globulin, Total: 2.9 g/dL (ref 1.5–4.5)
Glucose: 138 mg/dL — ABNORMAL HIGH (ref 70–99)
Potassium: 5.3 mmol/L — ABNORMAL HIGH (ref 3.5–5.2)
Sodium: 143 mmol/L (ref 134–144)
Total Protein: 7.1 g/dL (ref 6.0–8.5)
eGFR: 41 mL/min/{1.73_m2} — ABNORMAL LOW (ref >59)

## 2021-06-10 LAB — LIPID PANEL
Chol/HDL Ratio: 3.7 ratio (ref 0.0–5.0)
Cholesterol, Total: 111 mg/dL (ref 100–199)
HDL: 30 mg/dL — ABNORMAL LOW (ref >39)
LDL Chol Calc (NIH): 65 mg/dL (ref 0–99)
Triglycerides: 79 mg/dL (ref 0–149)
VLDL Cholesterol Cal: 16 mg/dL (ref 5–40)

## 2021-06-16 ENCOUNTER — Ambulatory Visit (INDEPENDENT_AMBULATORY_CARE_PROVIDER_SITE_OTHER): Payer: PPO | Admitting: Family Medicine

## 2021-06-16 ENCOUNTER — Encounter: Payer: Self-pay | Admitting: Family Medicine

## 2021-06-16 ENCOUNTER — Other Ambulatory Visit: Payer: Self-pay

## 2021-06-16 VITALS — BP 113/63 | HR 79 | Temp 97.7°F | Ht 71.0 in | Wt 214.5 lb

## 2021-06-16 DIAGNOSIS — L03012 Cellulitis of left finger: Secondary | ICD-10-CM | POA: Diagnosis not present

## 2021-06-16 MED ORDER — CEPHALEXIN 500 MG PO CAPS: 500.0000 mg | ORAL_CAPSULE | Freq: Two times a day (BID) | ORAL | 0 refills | Status: DC

## 2021-06-16 NOTE — Progress Notes (Signed)
Acute Office Visit  Subjective:    Patient ID: John [name], male    DOB: 08/04/1951, 70 y.o.   MRN: [id number]  Chief Complaint  Patient presents with   Hand Pain    HPI Patient is in today for hand pain and swelling x 2 days. It is in his entire left hand. He denies known injury. He did have a small cut on his ring finger from the weed eater. He reports that this is now scabbed over. He has been unable to grip with this hand due to the pain and swelling. He denies numbness or tingling. He can move his hand and fingers. He denies fever, chills, or drainage.   Past Medical History:  Diagnosis Date   Abnormal nuclear stress test 05/10/2017   Allergy    Anemia    "lost blood w/the cancer"   Arthritis    back    Atrial fibrillation (HCC)    takes Coumadin daily   Bruises easily    takes Coumadin daily   CAD (coronary artery disease)    a. s/p CABG 11/2000 (L-LAD, S-RCA);  b. Lexiscan Myoview 11/12: EF 45%, ischemia and possibly some scar at the base of the inferolateral wall;   c. 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 s/p mechanical (St. Jude) MVR 2002;  b. Echo 5/11: Mild LVH, EF 45-50%, mild AS, mild AI, mean gradient 11 mmHg, MVR with  normal gradients, moderate LAE, mild RAE;  c.  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   SOB (shortness of breath)    with exertion   Type II diabetes mellitus ([place])    takes Januvia,Glipizide,and Metformin daily as well as Lantus   Unstable angina ([place]) 06/21/2020    Past Surgical History:  Procedure Laterality Date   APPLICATION OF WOUND VAC  2014   CARDIAC CATHETERIZATION  2002   COLONOSCOPY     CORONARY ARTERY BYPASS GRAFT  2002   x 3   CORONARY ARTERY BYPASS GRAFT     CORONARY ATHERECTOMY N/A 06/24/2020   Procedure: CORONARY ATHERECTOMY;  Surgeon: [name], MD;  Location: [place] [facility];  Service: Cardiovascular;  Laterality: N/A;   CORONARY BALLOON ANGIOPLASTY N/A 06/24/2020   Procedure: CORONARY BALLOON ANGIOPLASTY;  Surgeon: [name], MD;  Location: [place] [facility];  Service: Cardiovascular;  Laterality: N/A;   CORONARY STENT  INTERVENTION N/A 06/24/2020   Procedure: CORONARY STENT INTERVENTION;  Surgeon: [name], MD;  Location: [place] [facility];  Service: Cardiovascular;  Laterality: N/A;   ESOPHAGOGASTRODUODENOSCOPY     HERNIA REPAIR     ICD IMPLANT N/A 02/19/2020   Procedure: ICD IMPLANT;  Surgeon: Evans Lance, MD;  Location: [place] [facility];  Service: Cardiovascular;  Laterality: N/A;   INCISIONAL HERNIA REPAIR N/A 03/15/2014   Procedure: LAPAROSCOPIC INCISIONAL HERNIA;  Surgeon: Harl Bowie, MD;  Location: [place];  Service: General;  Laterality: N/A;   INSERTION OF MESH N/A 03/15/2014   Procedure: INSERTION OF MESH;  Surgeon: Harl Bowie, MD;  Location: [place];  Service: General;  Laterality: N/A;   LAPAROSCOPIC INCISIONAL / UMBILICAL / [facility]  03/15/2014   I[facility]   LAPAROTOMY N/A 02/09/2013   Procedure: EXPLORATORY LAPAROTOMY WITH incisional biopsy intra- ABDOMINAL MASS ILOCECTOMY ;   Surgeon: Harl Bowie, MD;  Location: [facility];  Service: General;  Laterality: N/A;   MITRAL VALVE REPLACEMENT  11/2000   #33 St. Jude mechanical mitral valve prosthesis. Dr. Servando Snare   [facility] REMOVAL  03/15/2014   PORT-A-CATH REMOVAL Left 03/15/2014   Procedure: REMOVAL PORT-A-CATH;  Surgeon: Harl Bowie, MD;  Location: [place];  Service: General;  Laterality: Left;   PORTACATH PLACEMENT N/A 02/09/2013   Procedure: INSERTION PORT-A-CATH;  Surgeon: Harl Bowie, MD;  Location: [facility];  Service: General;  Laterality: N/A;   RIGHT/LEFT HEART CATH AND CORONARY/GRAFT ANGIOGRAPHY N/A 05/10/2017   Procedure: RIGHT/LEFT HEART CATH AND CORONARY/GRAFT ANGIOGRAPHY;  Surgeon: Martinique, Peter M, MD;  Location: [place] [facility];  Service: Cardiovascular;  Laterality: N/A;   RIGHT/LEFT HEART CATH AND CORONARY/GRAFT ANGIOGRAPHY N/A 06/23/2020   Procedure: RIGHT/LEFT HEART CATH AND CORONARY/GRAFT ANGIOGRAPHY;  Surgeon: Troy Sine, MD;  Location: [place] [facility];  Service: Cardiovascular;  Laterality: N/A;   SEPTOPLASTY     SMALL INTESTINE SURGERY      Family History  Problem Relation Age of Onset   Diabetes Mother    Hypertension Father    Thyroid disease Sister    Diabetes Brother    Arthritis Brother    Depression Brother    Heart disease Brother    Hyperlipidemia Brother    Hypertension Brother    Hypertension Brother    Diabetes Brother    Heart disease Brother    Hyperlipidemia Brother     Social History   Socioeconomic History   Marital status: Married    Spouse name: Marcie Bal   Number of children: 1   Years of education: 12   Highest education level: High school graduate  Occupational History   Occupation: disabled  Tobacco Use   Smoking status: Former    Types: Cigars   Smokeless tobacco: Never  Vaping Use   Vaping Use: Never used  Substance and Sexual Activity   Alcohol use: No    Comment: "drank for 30 years; quit ~ 2000"   Drug use: No    Types:  Marijuana    Comment: quit in 2000 'reefer'   Sexual activity: Yes  Other Topics Concern   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   Social Determinants of Health   Financial Resource Strain: Not on file  Food Insecurity: Not on file  Transportation Needs: Not on file  Physical Activity: Not on file  Stress: Not on file  Social Connections: Not on file  Intimate Partner Violence: Not on file    Outpatient Medications Prior to Visit  Medication Sig Dispense Refill   canagliflozin (INVOKANA) 300 MG TABS tablet Take 300 mg by mouth every other day.     clopidogrel (PLAVIX) 75 MG tablet TAKE 1 TABLET BY MOUTH DAILY WITH BREAKFAST. 90 tablet 3   furosemide (LASIX) 40 MG tablet Take 1 tablet (40 mg total) by mouth daily. 90 tablet 2   glipiZIDE (GLUCOTROL XL) 10 MG 24 hr tablet Take 1 tablet (10 mg total) by mouth daily. 90 tablet 3   glucose blood (ONETOUCH VERIO) test strip USE TO TEST TWICE A DAY 200 strip 3   insulin glargine (LANTUS) 100 UNIT/ML injection Inject 0.7 mLs (70 Units total) into the skin at bedtime. (Patient taking differently: Inject 70 Units into the skin. Take 70 units if over 180 at bedtime) 10 mL 11   isosorbide mononitrate (IMDUR) 30 MG 24 hr tablet Take 1 tablet (30 mg total) by mouth daily. 90 tablet 3   metFORMIN (GLUCOPHAGE) 500 MG tablet Take 1 tablet (500 mg total) by mouth daily with breakfast. 90 tablet 1   metoprolol (TOPROL-XL) 200 MG 24 hr tablet TAKE 1 TABLET (200 MG TOTAL) BY MOUTH DAILY AFTER BREAKFAST. 90 tablet 0   nitroGLYCERIN (NITROSTAT) 0.4 MG SL tablet Place 1 tablet (0.4 mg total) under the tongue every 5 (five) minutes as needed for chest pain. 50 tablet 10   OneTouch Delica Lancets 06T MISC TEST 2 TIMES DAILY AS DIRECTED 100 each 5   pantoprazole (PROTONIX) 40 MG tablet Take 1 tablet (40 mg total) by mouth daily. 90 tablet 3   rosuvastatin (CRESTOR) 20 MG tablet TAKE 1 TABLET BY MOUTH EVERY DAY 90  tablet 3   sacubitril-valsartan (ENTRESTO) 24-26 MG Take 1 tablet by mouth 2 (two) times daily. 180 tablet 3   sitaGLIPtin (JANUVIA) 50 MG tablet Take 1 tablet (50 mg total) by mouth daily. 90 tablet 0   sodium bicarbonate 650 MG tablet Take 650 mg by mouth 2 (two) times daily.     warfarin (COUMADIN) 5 MG tablet Take 1 tablet (5 mg total) by mouth daily. 90 tablet 3   No facility-administered medications prior to visit.    Allergies  Allergen Reactions   Doxycycline Hives   Farxiga [Dapagliflozin]     Can not tolerate     Review of Systems As per HPI.     Objective:    Physical Exam Vitals and nursing note reviewed.  Constitutional:      General: He is not in acute distress.    Appearance: He is not ill-appearing, toxic-appearing or diaphoretic.  Pulmonary:     Effort: Pulmonary effort is normal. No respiratory distress.  Musculoskeletal:     Left hand: Swelling (diffuse) and tenderness (generalized) present. No bony tenderness. Decreased range of motion (generalized hand and fingers). Normal sensation. There is no disruption of two-point discrimination. Normal capillary refill. Normal pulse.     Comments: Mild warmth and erythema to left hand.   Neurological:     Mental Status: He is alert and oriented to person, place, and time.  Psychiatric:        Mood and Affect: Mood normal.        Behavior: Behavior normal.    BP 113/63   Pulse 79   Temp 97.7 F (36.5 C) (Temporal)   Ht _0  (1.803 m)   Wt 214 lb 8 oz (97.3 kg)   BMI 29.92 kg/m  Wt Readings from Last 3 Encounters:  06/16/21 214 lb 8 oz (97.3 kg)  06/09/21 214 lb (97.1 kg)  05/26/21 215 lb (97.5 kg)    Health Maintenance Due  Topic Date Due   COVID-19 Vaccine (4 - Booster for Moderna series) 11/10/2020   OPHTHALMOLOGY EXAM  02/09/2021   FOOT EXAM  05/19/2021    There are no preventive care reminders to display for this patient.   Lab Results  Component Value Date   TSH 3.320 09/23/2017   Lab  Results  Component Value Date   WBC 6.9 06/09/2021   HGB 13.7 06/09/2021   HCT 43.6 06/09/2021   MCV 86 06/09/2021   PLT 180 06/09/2021   Lab Results  Component Value Date   NA 143 06/09/2021   K 5.3 (H) 06/09/2021   CHLORIDE 110 (H) 07/09/2017   CO2 23 06/09/2021   GLUCOSE 138 (H) 06/09/2021   BUN 62 (H) 06/09/2021   CREATININE 1.79 (H) 06/09/2021   BILITOT 0.6 06/09/2021   ALKPHOS 170 (H) 06/09/2021   AST 24 06/09/2021   ALT 20 06/09/2021   PROT 7.1 06/09/2021   ALBUMIN 4.2 06/09/2021   CALCIUM 9.2 06/09/2021   ANIONGAP 9 07/01/2020   EGFR 41 (L) 06/09/2021   GFR 61.63 07/23/2012   Lab Results  Component Value Date   CHOL 111 06/09/2021   Lab Results  Component Value Date   HDL 30 (L) 06/09/2021   Lab Results  Component Value Date   LDLCALC 65 06/09/2021   Lab Results  Component Value Date   TRIG 79 06/09/2021   Lab Results  Component Value Date   CHOLHDL 3.7 06/09/2021   Lab Results  Component Value Date   HGBA1C 6.5 (H) 06/09/2021       Assessment & Plan:   Giulio was seen today for hand pain.  Diagnoses and all orders for this visit:  Cellulitis of finger of left hand Keflex as below. Discussed return precautions.  -     cephALEXin (KEFLEX) 500 MG capsule; Take 1 capsule (500 mg total) by mouth 2 (two) times daily.  Return to office for new or worsening symptoms, or if symptoms persist.   The patient indicates understanding of these issues and agrees with the plan.   Gwenlyn Perking, FNP

## 2021-06-16 NOTE — Patient Instructions (Signed)

## 2021-06-21 NOTE — Progress Notes (Signed)
Hello John [name],  Your lab result is normal and/or stable.Some minor variations that are not significant are commonly marked abnormal, but do not represent any medical problem for you.  Best regards, Joliet Mallozzi, M.D.

## 2021-06-29 ENCOUNTER — Ambulatory Visit (INDEPENDENT_AMBULATORY_CARE_PROVIDER_SITE_OTHER): Payer: PPO | Admitting: Family Medicine

## 2021-06-29 ENCOUNTER — Encounter: Payer: Self-pay | Admitting: Family Medicine

## 2021-06-29 ENCOUNTER — Other Ambulatory Visit: Payer: Self-pay

## 2021-06-29 VITALS — BP 117/56 | HR 67 | Temp 98.1°F | Ht 71.0 in | Wt 208.2 lb

## 2021-06-29 DIAGNOSIS — R600 Localized edema: Secondary | ICD-10-CM

## 2021-06-29 DIAGNOSIS — Z9889 Other specified postprocedural states: Secondary | ICD-10-CM

## 2021-06-29 DIAGNOSIS — Z7901 Long term (current) use of anticoagulants: Secondary | ICD-10-CM

## 2021-06-29 DIAGNOSIS — I4811 Longstanding persistent atrial fibrillation: Secondary | ICD-10-CM | POA: Diagnosis not present

## 2021-06-29 DIAGNOSIS — Z95810 Presence of automatic (implantable) cardiac defibrillator: Secondary | ICD-10-CM

## 2021-06-29 LAB — COAGUCHEK XS/INR WAIVED
INR: 3.2 — ABNORMAL HIGH (ref 0.9–1.1)
Prothrombin Time: 38 s

## 2021-06-29 MED ORDER — AMOXICILLIN-POT CLAVULANATE 875-125 MG PO TABS: 1.0000 | ORAL_TABLET | Freq: Two times a day (BID) | ORAL | 0 refills | Status: DC

## 2021-06-29 NOTE — Progress Notes (Signed)
After starting his antibiotic.   Subjective:  Patient ID: John [name], male    DOB: 1950/09/26  Age: 70 y.o. MRN: [id number]  CC: Cellulitis (Left hand, hand stil hurt & fingers still swollen, while on abx didn't feel well)   HPI John [name] presents for recheck of cellulitis of hand. Somewhat better. Has a grip but it is weak. Now still swollen and weak, but improving.  Still has throbbing pain. Pain has declined from 8-9/10 to 4-5/10. Had two days of chest pain , aching,   Depression screen John [name] 2/9 06/29/2021 05/08/2021 04/03/2021  Decreased Interest 0 0 0  Down, Depressed, Hopeless 0 0 0  PHQ - 2 Score 0 0 0  Altered sleeping 0 0 0  Tired, decreased energy 1 0 1  Change in appetite 0 0 0  Feeling bad or failure about yourself  0 0 0  Trouble concentrating 0 0 0  Moving slowly or fidgety/restless 0 0 0  Suicidal thoughts 0 0 0  PHQ-9 Score 1 0 1  Difficult doing work/chores - - Not difficult at all  Some recent data might be hidden    History John [name] has a past medical history of Abnormal nuclear stress test (05/10/2017), Allergy, Anemia, Arthritis, Atrial fibrillation ([place]), Bruises easily, CAD (coronary artery disease), CHF (congestive heart failure) ([place]), Constipation, 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   He has a past surgical history that includes Mitral valve replacement (11/2000); laparotomy (N/A, 02/09/2013); Portacath placement (N/A, 02/09/2013); Coronary artery bypass graft (2002);  Septoplasty; Application if wound vac (2014); Colonoscopy; Esophagogastroduodenoscopy; Laparoscopic incisional / umbilical / ventral hernia repair (03/15/2014); Port-a-cath removal (03/15/2014); Hernia repair; Cardiac catheterization (2002); Incisional hernia repair (N/A, 03/15/2014); Port-a-cath removal (Left, 03/15/2014); Insertion of mesh (N/A, 03/15/2014); RIGHT/LEFT HEART CATH AND CORONARY/GRAFT ANGIOGRAPHY (N/A, 05/10/2017); Coronary artery bypass graft; Small intestine surgery; ICD IMPLANT (N/A, 02/19/2020); RIGHT/LEFT HEART CATH AND CORONARY/GRAFT ANGIOGRAPHY (N/A, 06/23/2020); CORONARY STENT INTERVENTION (N/A, 06/24/2020); CORONARY ATHERECTOMY (N/A, 06/24/2020); and CORONARY BALLOON ANGIOPLASTY (N/A, 06/24/2020).   His family history includes Arthritis in his brother; Depression in his brother; Diabetes in his brother, brother, and mother; Heart disease in his brother and brother; Hyperlipidemia in his brother and brother; Hypertension in his brother, brother, and father; Thyroid disease in his sister.He reports that he has quit smoking. His smoking use included cigars. He has never used smokeless tobacco. He reports that he does not drink alcohol and does not use drugs.    ROS Review of Systems  Constitutional:  Negative for activity change and fever.  Cardiovascular:  Chest pain: nonepisodic, no radiation, substernal in orientation.   Objective:  BP (!) 117/56   Pulse 67   Temp 98.1 F (36.7 C) (Temporal)   Ht 5\' 11"  (1.803 m)   Wt 208 lb 3.2 oz (94.4 kg)   BMI 29.04 kg/m   BP Readings from Last 3 Encounters:  06/29/21 (!) 117/56  06/16/21 113/63  06/09/21 (!) 112/54    Wt Readings from Last 3 Encounters:  06/29/21 208 lb 3.2 oz (94.4 kg)  06/16/21 214 lb 8 oz (97.3 kg)  06/09/21 214 lb (97.1 kg)  Physical Exam Vitals reviewed.  Constitutional:      Appearance: He is well-developed.  HENT:     Head: Normocephalic and atraumatic.     Right Ear: External ear normal.     Left  Ear: External ear normal.     Mouth/Throat:     Pharynx: No oropharyngeal exudate or posterior oropharyngeal erythema.  Eyes:     Pupils: Pupils are equal, round, and reactive to light.  Cardiovascular:     Rate and Rhythm: Normal rate and regular rhythm.     Heart sounds: No murmur heard. Pulmonary:     Effort: No respiratory distress.     Breath sounds: Normal breath sounds.  Musculoskeletal:        General: Swelling (with erythema at left hand 3-4 PIP, extending past MCPs and to DIPs.) and tenderness (moderate at left hand  3-4 PIP area) present.     Cervical back: Normal range of motion and neck supple.  Skin:    General: Skin is warm.  Neurological:     Mental Status: He is alert and oriented to person, place, and time.       Assessment & Plan:   John [name] was seen today for cellulitis.  Diagnoses and all orders for this visit:  Hand edema -     CBC with Differential/Platelet -     Uric acid -     CoaguChek XS/INR Waived  Long term (current) use of anticoagulants [Z79.01]  MITRAL VALVE REPLACEMENT, HX OF  ICD (implantable cardioverter-defibrillator) in place  Longstanding persistent atrial fibrillation (HCC)  Other orders -     amoxicillin-clavulanate (AUGMENTIN) 875-125 MG tablet; Take 1 tablet by mouth 2 (two) times daily. Take all of this medication      I have discontinued John [name]'s cephALEXin. I am also having him start on amoxicillin-clavulanate. Additionally, I am having him maintain his nitroGLYCERIN, OneTouch Verio, sitaGLIPtin, insulin glargine, Entresto, furosemide, canagliflozin, sodium bicarbonate, metFORMIN, OneTouch Delica Lancets 85I, rosuvastatin, warfarin, clopidogrel, metoprolol, glipiZIDE, isosorbide mononitrate, and pantoprazole.  Allergies as of 06/29/2021       Reactions   Doxycycline Hives   Farxiga [dapagliflozin]    Can not tolerate         Medication List        Accurate as of June 29, 2021  8:52 AM. If you  have any questions, ask your nurse or doctor.          STOP taking these medications    cephALEXin 500 MG capsule Commonly known as: Keflex Stopped by: John Fraise, MD       TAKE these medications    amoxicillin-clavulanate 875-125 MG tablet Commonly known as: AUGMENTIN Take 1 tablet by mouth 2 (two) times daily. Take all of this medication Started by: John Fraise, MD   canagliflozin 300 MG Tabs tablet Commonly known as: INVOKANA Take 300 mg by mouth every other day.   clopidogrel 75 MG tablet Commonly known as: PLAVIX TAKE 1 TABLET BY MOUTH DAILY WITH BREAKFAST.   Entresto 24-26 MG Generic drug: sacubitril-valsartan Take 1 tablet by mouth 2 (two) times daily.   furosemide 40 MG tablet Commonly known as: LASIX Take 1 tablet (40 mg total) by mouth daily.   glipiZIDE 10 MG 24 hr tablet Commonly known as: GLUCOTROL XL Take 1 tablet (10 mg total) by mouth daily.   insulin glargine 100 UNIT/ML injection Commonly known as: LANTUS Inject 0.7 mLs (70 Units total) into the skin at bedtime. What changed:  when  to take this additional instructions   isosorbide mononitrate 30 MG 24 hr tablet Commonly known as: IMDUR Take 1 tablet (30 mg total) by mouth daily.   metFORMIN 500 MG tablet Commonly known as: GLUCOPHAGE Take 1 tablet (500 mg total) by mouth daily with breakfast.   metoprolol 200 MG 24 hr tablet Commonly known as: TOPROL-XL TAKE 1 TABLET (200 MG TOTAL) BY MOUTH DAILY AFTER BREAKFAST.   nitroGLYCERIN 0.4 MG SL tablet Commonly known as: NITROSTAT Place 1 tablet (0.4 mg total) under the tongue every 5 (five) minutes as needed for chest pain.   OneTouch Delica Lancets 33P Misc TEST 2 TIMES DAILY AS DIRECTED   OneTouch Verio test strip Generic drug: glucose blood USE TO TEST TWICE A DAY   pantoprazole 40 MG tablet Commonly known as: PROTONIX Take 1 tablet (40 mg total) by mouth daily.   rosuvastatin 20 MG tablet Commonly known as:  CRESTOR TAKE 1 TABLET BY MOUTH EVERY DAY   sitaGLIPtin 50 MG tablet Commonly known as: Januvia Take 1 tablet (50 mg total) by mouth daily.   sodium bicarbonate 650 MG tablet Take 650 mg by mouth 2 (two) times daily.   warfarin 5 MG tablet Commonly known as: COUMADIN Take as directed by the anticoagulation clinic. If you are unsure how to take this medication, talk to your nurse or doctor. Original instructions: Take 1 tablet (5 mg total) by mouth daily.         Follow-up: Return in about 2 weeks (around 07/13/2021), or if symptoms worsen or fail to improve.  John [name], M.D.

## 2021-06-30 LAB — CBC WITH DIFFERENTIAL/PLATELET
Basophils Absolute: 0 10*3/uL (ref 0.0–0.2)
Basos: 1 %
EOS (ABSOLUTE): 0.1 10*3/uL (ref 0.0–0.4)
Eos: 3 %
Hematocrit: 41.9 % (ref 37.5–51.0)
Hemoglobin: 13.3 g/dL (ref 13.0–17.7)
Immature Grans (Abs): 0 10*3/uL (ref 0.0–0.1)
Immature Granulocytes: 0 %
Lymphocytes Absolute: 0.7 10*3/uL (ref 0.7–3.1)
Lymphs: 16 %
MCH: 26.8 pg (ref 26.6–33.0)
MCHC: 31.7 g/dL (ref 31.5–35.7)
MCV: 85 fL (ref 79–97)
Monocytes Absolute: 0.6 10*3/uL (ref 0.1–0.9)
Monocytes: 14 %
Neutrophils Absolute: 2.9 10*3/uL (ref 1.4–7.0)
Neutrophils: 66 %
Platelets: 226 10*3/uL (ref 150–450)
RBC: 4.96 x10E6/uL (ref 4.14–5.80)
RDW: 16.7 % — ABNORMAL HIGH (ref 11.6–15.4)
WBC: 4.3 10*3/uL (ref 3.4–10.8)

## 2021-06-30 LAB — URIC ACID: Uric Acid: 9.4 mg/dL — ABNORMAL HIGH (ref 3.8–8.4)

## 2021-07-03 ENCOUNTER — Other Ambulatory Visit: Payer: Self-pay | Admitting: Family Medicine

## 2021-07-03 MED ORDER — COLCHICINE 0.6 MG PO TABS: ORAL_TABLET | ORAL | 2 refills | Status: DC

## 2021-07-03 MED ORDER — AMOXICILLIN-POT CLAVULANATE 875-125 MG PO TABS: 1.0000 | ORAL_TABLET | Freq: Two times a day (BID) | ORAL | 0 refills | Status: DC

## 2021-07-06 ENCOUNTER — Other Ambulatory Visit: Payer: Self-pay

## 2021-07-06 ENCOUNTER — Ambulatory Visit (INDEPENDENT_AMBULATORY_CARE_PROVIDER_SITE_OTHER): Payer: PPO | Admitting: Family Medicine

## 2021-07-06 ENCOUNTER — Encounter: Payer: Self-pay | Admitting: Family Medicine

## 2021-07-06 VITALS — BP 94/53 | HR 61 | Temp 96.8°F | Ht 71.0 in | Wt 211.8 lb

## 2021-07-06 DIAGNOSIS — Z7901 Long term (current) use of anticoagulants: Secondary | ICD-10-CM

## 2021-07-06 LAB — COAGUCHEK XS/INR WAIVED
INR: 2.1 — ABNORMAL HIGH (ref 0.9–1.1)
Prothrombin Time: 25.7 s

## 2021-07-08 ENCOUNTER — Encounter: Payer: Self-pay | Admitting: Family Medicine

## 2021-07-08 NOTE — Progress Notes (Signed)
Subjective:  Patient ID: John [name], male    DOB: Oct 18, 1950  Age: 70 y.o. MRN: [id number]  CC: Atrial Fibrillation   HPI John [name] presents for Atrial fibrillation follow up. Pt. is treated with rate control and anticoagulation. Pt.  denies palpitations, rapid rate, chest pain, dyspnea and edema. There has been no bleeding from nose or gums. Pt. has not noticed blood with urine or stool.  Although there is routine bruising easily, it is not excessive.   Depression screen [facility] 2/9 07/06/2021 07/06/2021 06/29/2021  Decreased Interest 0 0 0  Down, Depressed, Hopeless 0 0 0  PHQ - 2 Score 0 0 0  Altered sleeping - - 0  Tired, decreased energy - - 1  Change in appetite - - 0  Feeling bad or failure about yourself  - - 0  Trouble concentrating - - 0  Moving slowly or fidgety/restless - - 0  Suicidal thoughts - - 0  PHQ-9 Score - - 1  Difficult doing work/chores - - -  Some recent data might be hidden    History John [name] has a past medical history of Abnormal nuclear stress test (05/10/2017), Allergy, Anemia, Arthritis, Atrial fibrillation (HCC), Bruises easily, CAD (coronary artery disease), CHF (congestive heart failure) ([place]), Constipation, 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   John [name] has a past surgical history that includes Mitral valve replacement (11/2000); laparotomy (N/A, 02/09/2013); Portacath placement (N/A, 02/09/2013); Coronary artery bypass graft (2002); Septoplasty; Application if wound vac  (2014); Colonoscopy; Esophagogastroduodenoscopy; Laparoscopic incisional / umbilical / ventral hernia repair (03/15/2014); Port-a-cath removal (03/15/2014); Hernia repair; Cardiac catheterization (2002); Incisional hernia repair (N/A, 03/15/2014); Port-a-cath removal (Left, 03/15/2014); Insertion of mesh (N/A, 03/15/2014); RIGHT/LEFT HEART CATH AND CORONARY/GRAFT ANGIOGRAPHY (N/A, 05/10/2017); Coronary artery bypass graft; Small intestine surgery; ICD IMPLANT (N/A, 02/19/2020); RIGHT/LEFT HEART CATH AND CORONARY/GRAFT ANGIOGRAPHY (N/A, 06/23/2020); CORONARY STENT INTERVENTION (N/A, 06/24/2020); CORONARY ATHERECTOMY (N/A, 06/24/2020); and CORONARY BALLOON ANGIOPLASTY (N/A, 06/24/2020).   His family history includes Arthritis in his brother; Depression in his brother; Diabetes in his brother, brother, and mother; Heart disease in his brother and brother; Hyperlipidemia in his brother and brother; Hypertension in his brother, brother, and father; Thyroid disease in his sister.John [name] reports that John [name] has quit smoking. His smoking use included cigars. John [name] has never used smokeless tobacco. John [name] reports that John [name] does not drink alcohol and does not use drugs.    ROS Review of Systems  Constitutional:  Negative for fever.  Respiratory:  Negative for shortness of breath.   Cardiovascular:  Negative for chest pain.  Musculoskeletal:  Negative for arthralgias.  Skin:  Negative for rash.   Objective:  BP (!) 94/53   Pulse 61   Temp (!) 96.8 F (36 C)   Ht 5\' 11"  (1.803 m)   Wt 211 lb 12.8 oz (96.1 kg)   SpO2 98%   BMI 29.54 kg/m   BP Readings from Last 3 Encounters:  07/06/21 (!) 94/53  06/29/21 (!) 117/56  06/16/21 113/63    Wt Readings from Last 3 Encounters:  07/06/21 211 lb 12.8 oz (96.1 kg)  06/29/21 208 lb 3.2 oz (94.4 kg)  06/16/21  214 lb 8 oz (97.3 kg)     Physical Exam Vitals reviewed.  Constitutional:      Appearance: John [name] is well-developed.  HENT:     Head: Normocephalic and atraumatic.     Right  Ear: External ear normal.     Left Ear: External ear normal.     Mouth/Throat:     Pharynx: No oropharyngeal exudate or posterior oropharyngeal erythema.  Eyes:     Pupils: Pupils are equal, round, and reactive to light.  Cardiovascular:     Rate and Rhythm: Normal rate and regular rhythm.     Heart sounds: No murmur heard. Pulmonary:     Effort: No respiratory distress.     Breath sounds: Normal breath sounds.  Musculoskeletal:     Cervical back: Normal range of motion and neck supple.  Neurological:     Mental Status: John [name] is alert and oriented to person, place, and time.      Assessment & Plan:   John [name] was seen today for atrial fibrillation.  Diagnoses and all orders for this visit:  Long term (current) use of anticoagulants -     CoaguChek XS/INR Waived      I am having John [name] maintain his nitroGLYCERIN, OneTouch Verio, sitaGLIPtin, insulin glargine, Entresto, furosemide, canagliflozin, sodium bicarbonate, metFORMIN, OneTouch Delica Lancets 16X, rosuvastatin, warfarin, clopidogrel, metoprolol, glipiZIDE, isosorbide mononitrate, pantoprazole, amoxicillin-clavulanate, and colchicine.  Allergies as of 07/06/2021       Reactions   Doxycycline Hives   Farxiga [dapagliflozin]    Can not tolerate         Medication List        Accurate as of July 06, 2021 11:59 PM. If you have any questions, ask your nurse or doctor.          amoxicillin-clavulanate 875-125 MG tablet Commonly known as: AUGMENTIN Take 1 tablet by mouth 2 (two) times daily. Take all of this medication   canagliflozin 300 MG Tabs tablet Commonly known as: INVOKANA Take 300 mg by mouth every other day.   clopidogrel 75 MG tablet Commonly known as: PLAVIX TAKE 1 TABLET BY MOUTH DAILY WITH BREAKFAST.   colchicine 0.6 MG tablet Take twice daily for gout attack. (may take every two hours up to 6 doses at acute onset)   Entresto 24-26 MG Generic drug: sacubitril-valsartan Take  1 tablet by mouth 2 (two) times daily.   furosemide 40 MG tablet Commonly known as: LASIX Take 1 tablet (40 mg total) by mouth daily.   glipiZIDE 10 MG 24 hr tablet Commonly known as: GLUCOTROL XL Take 1 tablet (10 mg total) by mouth daily.   insulin glargine 100 UNIT/ML injection Commonly known as: LANTUS Inject 0.7 mLs (70 Units total) into the skin at bedtime. What changed:  when to take this additional instructions   isosorbide mononitrate 30 MG 24 hr tablet Commonly known as: IMDUR Take 1 tablet (30 mg total) by mouth daily.   metFORMIN 500 MG tablet Commonly known as: GLUCOPHAGE Take 1 tablet (500 mg total) by mouth daily with breakfast.   metoprolol 200 MG 24 hr tablet Commonly known as: TOPROL-XL TAKE 1 TABLET (200 MG TOTAL) BY MOUTH DAILY AFTER BREAKFAST.   nitroGLYCERIN 0.4 MG SL tablet Commonly known as: NITROSTAT Place 1 tablet (0.4 mg total) under the tongue every 5 (five) minutes as needed for chest pain.   OneTouch Delica Lancets 09U Misc TEST 2 TIMES DAILY AS DIRECTED   OneTouch Verio test strip Generic drug: glucose  blood USE TO TEST TWICE A DAY   pantoprazole 40 MG tablet Commonly known as: PROTONIX Take 1 tablet (40 mg total) by mouth daily.   rosuvastatin 20 MG tablet Commonly known as: CRESTOR TAKE 1 TABLET BY MOUTH EVERY DAY   sitaGLIPtin 50 MG tablet Commonly known as: Januvia Take 1 tablet (50 mg total) by mouth daily.   sodium bicarbonate 650 MG tablet Take 650 mg by mouth 2 (two) times daily.   warfarin 5 MG tablet Commonly known as: COUMADIN Take as directed by the anticoagulation clinic. If you are unsure how to take this medication, talk to your nurse or doctor. Original instructions: Take 1 tablet (5 mg total) by mouth daily.         Follow-up: Return in about 1 month (around 08/06/2021).  Claretta Fraise, M.D.

## 2021-07-13 ENCOUNTER — Other Ambulatory Visit: Payer: Self-pay

## 2021-07-13 ENCOUNTER — Encounter: Payer: Self-pay | Admitting: Family Medicine

## 2021-07-13 ENCOUNTER — Ambulatory Visit (INDEPENDENT_AMBULATORY_CARE_PROVIDER_SITE_OTHER): Payer: PPO | Admitting: Family Medicine

## 2021-07-13 VITALS — BP 104/56 | HR 70 | Temp 97.6°F | Ht 71.0 in | Wt 207.4 lb

## 2021-07-13 DIAGNOSIS — Z7901 Long term (current) use of anticoagulants: Secondary | ICD-10-CM | POA: Diagnosis not present

## 2021-07-13 LAB — COAGUCHEK XS/INR WAIVED
INR: 3.6 — ABNORMAL HIGH (ref 0.9–1.1)
Prothrombin Time: 43 s

## 2021-07-13 NOTE — Progress Notes (Signed)
Subjective:  Patient ID: John [name], male    DOB: 1951-03-17  Age: 70 y.o. MRN: [id number]  CC: Follow-up   HPI John [name] presents for rerecheck of swelling in left hand as well as INR. HE has been takin colchicine. Caused a lot of nausea. Swelling and pain have resolved.   Depression screen [facility] 2/9 07/13/2021 07/06/2021 07/06/2021  Decreased Interest 0 0 0  Down, Depressed, Hopeless 0 0 0  PHQ - 2 Score 0 0 0  Altered sleeping - - -  Tired, decreased energy - - -  Change in appetite - - -  Feeling bad or failure about yourself  - - -  Trouble concentrating - - -  Moving slowly or fidgety/restless - - -  Suicidal thoughts - - -  PHQ-9 Score - - -  Difficult doing work/chores - - -  Some recent data might be hidden    History John [name] has a past medical history of Abnormal nuclear stress test (05/10/2017), Allergy, Anemia, Arthritis, Atrial fibrillation ([place]), Bruises easily, CAD (coronary artery disease), CHF (congestive heart failure) ([place]), Constipation, 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   He has a past surgical history that includes Mitral valve replacement (11/2000); laparotomy (N/A, 02/09/2013); Portacath placement (N/A, 02/09/2013); Coronary artery bypass graft (2002); Septoplasty; Application if wound vac (2014); Colonoscopy; Esophagogastroduodenoscopy; Laparoscopic incisional / umbilical / ventral hernia repair (03/15/2014); Port-a-cath removal (03/15/2014); Hernia repair; Cardiac  catheterization (2002); Incisional hernia repair (N/A, 03/15/2014); Port-a-cath removal (Left, 03/15/2014); Insertion of mesh (N/A, 03/15/2014); RIGHT/LEFT HEART CATH AND CORONARY/GRAFT ANGIOGRAPHY (N/A, 05/10/2017); Coronary artery bypass graft; Small intestine surgery; ICD IMPLANT (N/A, 02/19/2020); RIGHT/LEFT HEART CATH AND CORONARY/GRAFT ANGIOGRAPHY (N/A, 06/23/2020); CORONARY STENT INTERVENTION (N/A, 06/24/2020); CORONARY ATHERECTOMY (N/A, 06/24/2020); and CORONARY BALLOON ANGIOPLASTY (N/A, 06/24/2020).   His family history includes Arthritis in his brother; Depression in his brother; Diabetes in his brother, brother, and mother; Heart disease in his brother and brother; Hyperlipidemia in his brother and brother; Hypertension in his brother, brother, and father; Thyroid disease in his sister.He reports that he has quit smoking. His smoking use included cigars. He has never used smokeless tobacco. He reports that he does not drink alcohol and does not use drugs.    ROS Review of Systems nonconributory Objective:  BP (!) 104/56   Pulse 70   Temp 97.6 F (36.4 C)   Ht 5\' 11"  (1.803 m)   Wt 207 lb 6.4 oz (94.1 kg)   SpO2 99%   BMI 28.93 kg/m   BP Readings from Last 3 Encounters:  07/13/21 (!) 104/56  07/06/21 (!) 94/53  06/29/21 (!) 117/56    Wt Readings from Last 3 Encounters:  07/13/21 207 lb 6.4 oz (94.1 kg)  07/06/21 211 lb 12.8 oz (96.1 kg)  06/29/21 208 lb 3.2 oz (94.4 kg)     Physical Exam Constitutional:      Appearance: Normal appearance.  Cardiovascular:     Rate and Rhythm: Normal rate. Rhythm irregular.  Pulmonary:     Effort: Pulmonary effort is normal.  Musculoskeletal:        General: Tenderness (minimal at left hand 3&4 PIP) present.  No deformity.  Neurological:     Mental Status: He is oriented to person, place, and time.    Results for orders placed or performed in visit on 07/13/21  CoaguChek XS/INR Waived  Result Value Ref Range   INR 3.6 (H) 0.9 - 1.1    Prothrombin Time 43.0 sec     Assessment & Plan:   John [name] was seen today for follow-up.  Diagnoses and all orders for this visit:  Long term (current) use of anticoagulants -     CoaguChek XS/INR Waived   Continue coumadin as is.    I am having John [name] maintain his nitroGLYCERIN, OneTouch Verio, sitaGLIPtin, insulin glargine, Entresto, furosemide, canagliflozin, sodium bicarbonate, metFORMIN, OneTouch Delica Lancets 96G, rosuvastatin, warfarin, clopidogrel, metoprolol, glipiZIDE, isosorbide mononitrate, pantoprazole, amoxicillin-clavulanate, and colchicine.  Allergies as of 07/13/2021       Reactions   Doxycycline Hives   Farxiga [dapagliflozin]    Can not tolerate         Medication List        Accurate as of July 13, 2021 11:59 PM. If you have any questions, ask your nurse or doctor.          amoxicillin-clavulanate 875-125 MG tablet Commonly known as: AUGMENTIN Take 1 tablet by mouth 2 (two) times daily. Take all of this medication   canagliflozin 300 MG Tabs tablet Commonly known as: INVOKANA Take 300 mg by mouth every other day.   clopidogrel 75 MG tablet Commonly known as: PLAVIX TAKE 1 TABLET BY MOUTH DAILY WITH BREAKFAST.   colchicine 0.6 MG tablet Take twice daily for gout attack. (may take every two hours up to 6 doses at acute onset)   Entresto 24-26 MG Generic drug: sacubitril-valsartan Take 1 tablet by mouth 2 (two) times daily.   furosemide 40 MG tablet Commonly known as: LASIX Take 1 tablet (40 mg total) by mouth daily.   glipiZIDE 10 MG 24 hr tablet Commonly known as: GLUCOTROL XL Take 1 tablet (10 mg total) by mouth daily.   insulin glargine 100 UNIT/ML injection Commonly known as: LANTUS Inject 0.7 mLs (70 Units total) into the skin at bedtime. What changed:  when to take this additional instructions   isosorbide mononitrate 30 MG 24 hr tablet Commonly known as: IMDUR Take 1 tablet (30 mg total) by mouth daily.    metFORMIN 500 MG tablet Commonly known as: GLUCOPHAGE Take 1 tablet (500 mg total) by mouth daily with breakfast.   metoprolol 200 MG 24 hr tablet Commonly known as: TOPROL-XL TAKE 1 TABLET (200 MG TOTAL) BY MOUTH DAILY AFTER BREAKFAST.   nitroGLYCERIN 0.4 MG SL tablet Commonly known as: NITROSTAT Place 1 tablet (0.4 mg total) under the tongue every 5 (five) minutes as needed for chest pain.   OneTouch Delica Lancets 83M Misc TEST 2 TIMES DAILY AS DIRECTED   OneTouch Verio test strip Generic drug: glucose blood USE TO TEST TWICE A DAY   pantoprazole 40 MG tablet Commonly known as: PROTONIX Take 1 tablet (40 mg total) by mouth daily.   rosuvastatin 20 MG tablet Commonly known as: CRESTOR TAKE 1 TABLET BY MOUTH EVERY DAY   sitaGLIPtin 50 MG tablet Commonly known as: Januvia Take 1 tablet (50 mg total) by mouth daily.   sodium bicarbonate 650 MG tablet Take 650 mg by mouth 2 (two) times daily.   warfarin 5 MG tablet Commonly known as: COUMADIN Take as directed by the anticoagulation clinic. If you are unsure how to take this  medication, talk to your nurse or doctor. Original instructions: Take 1 tablet (5 mg total) by mouth daily.         Follow-up: Return in about 1 month (around 08/12/2021).  Claretta Fraise, M.D.

## 2021-07-16 ENCOUNTER — Encounter: Payer: Self-pay | Admitting: Family Medicine

## 2021-07-25 DIAGNOSIS — I5032 Chronic diastolic (congestive) heart failure: Secondary | ICD-10-CM | POA: Insufficient documentation

## 2021-07-25 NOTE — Progress Notes (Signed)
Cardiology Office Note   Date:  07/26/2021   ID:  DEVONN GIAMPIETRO, DOB 04-24-1951, MRN [id number]  PCP:  Claretta Fraise, MD  Cardiologist:   Minus Breeding, MD   Chief Complaint  Patient presents with   Coronary Artery Disease       History of Present Illness: John [name] is a 70 y.o. male who presents for follow of MVR and atrial fib.  In April 2018 an echo demonstrated a stable MVR and stable mild aortic valve disease.  However, the EF was moderately reduced.  His EF at the previous echo was 50 although it had been reduced in the more distant past.  It was now 30 - 35% in Oct of last year.  I sent him for a Lexiscan Myoview in 2018.  He had a reduced EF with a new anterior wall perfusion defect that was not present on the previous perfusion study.  He underwent cardiac cath.  He had an occluded SVG to the RCA but flow through the native vessel.  The LAD was occluded but with good flow through the LIMA.  The patient was admitted for unstable angina in October 2021.  Cardiac catheterization performed on 06/23/2020 revealed 85% ostial left circumflex lesion, 100% mid LAD occlusion, 30% proximal RCA lesion, 80% proximal to distal RCA lesion, 100% occlusion in SVG to distal RCA, patent LIMA to LAD, 70% proximal to mid LAD lesion.  The left circumflex lesion was treated with a Resolute Onyx 2.75 x 26 mm DES on the following day with staged PCI.  Postprocedure, he was started on aspirin and Plavix.  Imdur was discontinued due to hypotension.  He was also started on Entresto.  His Imdur has been held and restarted with caution secondary to hypotension.  [name] has been held and then restarted at a lower dose secondary to CKD.    Since I last saw him he has done okay.  He has had no new shortness of breath, PND or orthopnea.  He said no new palpitations, presyncope or syncope.  He did have an episode about his arm.  He also fell out of a chair and hurt himself a little bit.   Past  Medical History:  Diagnosis Date   Abnormal nuclear stress test 05/10/2017   Allergy    Anemia    "lost blood w/the cancer"   Arthritis    back    Atrial fibrillation (HCC)    takes Coumadin daily   Bruises easily    takes Coumadin daily   CAD (coronary artery disease)    a. s/p CABG 11/2000 (L-LAD, S-RCA);  b. Lexiscan Myoview 11/12: EF 45%, ischemia and possibly some scar at the base of the inferolateral wall;   c. 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 s/p mechanical (St. Jude) MVR 2002;  b. Echo 5/11: Mild LVH, EF 45-50%, mild AS, mild AI, mean gradient 11 mmHg, MVR with normal gradients, moderate LAE, mild RAE;  c.  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   SOB (shortness of breath)    with exertion   Type II diabetes mellitus ([place])    takes Januvia,Glipizide,and Metformin daily as well as Lantus   Unstable angina ([place]) 06/21/2020    Past Surgical History:  Procedure Laterality Date   APPLICATION OF WOUND VAC  2014   CARDIAC CATHETERIZATION  2002   COLONOSCOPY     CORONARY ARTERY BYPASS GRAFT  2002   x 3    CORONARY ARTERY BYPASS GRAFT     CORONARY ATHERECTOMY N/A 06/24/2020   Procedure: CORONARY ATHERECTOMY;  Surgeon: [name], MD;  Location: [place] [facility];  Service: Cardiovascular;  Laterality: N/A;   CORONARY BALLOON ANGIOPLASTY N/A 06/24/2020   Procedure: CORONARY BALLOON ANGIOPLASTY;  Surgeon: [name], MD;  Location: [place] [facility];  Service: Cardiovascular;  Laterality: N/A;   CORONARY STENT INTERVENTION N/A 06/24/2020   Procedure: CORONARY STENT INTERVENTION;  Surgeon: [name], MD;  Location: [place] [facility];  Service: Cardiovascular;  Laterality: N/A;   ESOPHAGOGASTRODUODENOSCOPY     HERNIA REPAIR     ICD IMPLANT N/A 02/19/2020   Procedure: ICD IMPLANT;  Surgeon: Evans Lance, MD;  Location: [place] [facility];  Service: Cardiovascular;  Laterality: N/A;   INCISIONAL HERNIA REPAIR N/A 03/15/2014   Procedure: LAPAROSCOPIC INCISIONAL HERNIA;  Surgeon: Harl Bowie, MD;  Location: [place];  Service: General;  Laterality: N/A;   INSERTION OF MESH N/A 03/15/2014   Procedure: INSERTION OF MESH;  Surgeon: Harl Bowie, MD;  Location: [place];  Service: General;  Laterality: N/A;   LAPAROSCOPIC INCISIONAL / UMBILICAL / [place]  03/15/2014   I[facility]   LAPAROTOMY N/A 02/09/2013   Procedure: EXPLORATORY LAPAROTOMY WITH incisional biopsy intra- ABDOMINAL MASS ILOCECTOMY ;  Surgeon: Harl Bowie, MD;  Location: [facility];  Service: General;  Laterality: N/A;   MITRAL VALVE REPLACEMENT  11/2000   #33 St. Jude mechanical mitral valve prosthesis. Dr. Servando Snare   [facility] REMOVAL  03/15/2014   PORT-A-CATH REMOVAL Left 03/15/2014   Procedure: REMOVAL PORT-A-CATH;  Surgeon: Harl Bowie, MD;  Location: [place];  Service: General;  Laterality: Left;   PORTACATH PLACEMENT N/A 02/09/2013   Procedure: INSERTION PORT-A-CATH;  Surgeon: Harl Bowie, MD;  Location: [facility];  Service: General;  Laterality: N/A;   RIGHT/LEFT HEART CATH AND CORONARY/GRAFT  ANGIOGRAPHY N/A 05/10/2017   Procedure: RIGHT/LEFT HEART CATH AND CORONARY/GRAFT ANGIOGRAPHY;  Surgeon: Martinique, Peter M, MD;  Location: [place] [facility];  Service: Cardiovascular;  Laterality: N/A;   RIGHT/LEFT HEART CATH AND CORONARY/GRAFT ANGIOGRAPHY N/A 06/23/2020   Procedure: RIGHT/LEFT HEART CATH AND CORONARY/GRAFT ANGIOGRAPHY;  Surgeon: Troy Sine, MD;  Location: [place] [facility];  Service: Cardiovascular;  Laterality: N/A;   SEPTOPLASTY     SMALL INTESTINE SURGERY  Current Outpatient Medications  Medication Sig Dispense Refill   canagliflozin (INVOKANA) 300 MG TABS tablet Take 300 mg by mouth every other day.     clopidogrel (PLAVIX) 75 MG tablet TAKE 1 TABLET BY MOUTH DAILY WITH BREAKFAST. 90 tablet 3   colchicine 0.6 MG tablet Take twice daily for gout attack. (may take every two hours up to 6 doses at acute onset) 60 tablet 2   furosemide (LASIX) 40 MG tablet Take 1 tablet (40 mg total) by mouth daily. 90 tablet 2   glipiZIDE (GLUCOTROL XL) 10 MG 24 hr tablet Take 1 tablet (10 mg total) by mouth daily. 90 tablet 3   insulin glargine (LANTUS) 100 UNIT/ML injection Inject 0.7 mLs (70 Units total) into the skin at bedtime. (Patient taking differently: Inject 70 Units into the skin. Take 70 units if over 180 at bedtime) 10 mL 11   isosorbide mononitrate (IMDUR) 30 MG 24 hr tablet Take 1 tablet (30 mg total) by mouth daily. 90 tablet 3   metFORMIN (GLUCOPHAGE) 500 MG tablet Take 1 tablet (500 mg total) by mouth daily with breakfast. 90 tablet 1   metoprolol (TOPROL-XL) 200 MG 24 hr tablet TAKE 1 TABLET (200 MG TOTAL) BY MOUTH DAILY AFTER BREAKFAST. 90 tablet 0   nitroGLYCERIN (NITROSTAT) 0.4 MG SL tablet Place 1 tablet (0.4 mg total) under the tongue every 5 (five) minutes as needed for chest pain. 50 tablet 10   pantoprazole (PROTONIX) 40 MG tablet Take 1 tablet (40 mg total) by mouth daily. 90 tablet 3   rosuvastatin (CRESTOR) 20 MG tablet TAKE 1 TABLET BY MOUTH EVERY DAY 90  tablet 3   sacubitril-valsartan (ENTRESTO) 24-26 MG Take 1 tablet by mouth 2 (two) times daily. 180 tablet 3   sitaGLIPtin (JANUVIA) 50 MG tablet Take 1 tablet (50 mg total) by mouth daily. 90 tablet 0   sodium bicarbonate 650 MG tablet Take 650 mg by mouth 2 (two) times daily.     warfarin (COUMADIN) 5 MG tablet Take 1 tablet (5 mg total) by mouth daily. 90 tablet 3   amoxicillin-clavulanate (AUGMENTIN) 875-125 MG tablet Take 1 tablet by mouth 2 (two) times daily. Take all of this medication (Patient not taking: Reported on 07/26/2021) 20 tablet 0   glucose blood (ONETOUCH VERIO) test strip USE TO TEST TWICE A DAY 200 strip 3   OneTouch Delica Lancets 09O MISC TEST 2 TIMES DAILY AS DIRECTED 100 each 5   No current facility-administered medications for this visit.    Allergies:   Doxycycline and Farxiga [dapagliflozin]    ROS:  Please see the history of present illness.   Otherwise, review of systems are positive for none.   All other systems are reviewed and negative.    PHYSICAL EXAM: VS:  BP 104/68   Pulse 66   Ht 5\' 11"  (1.803 m)   Wt 212 lb (96.2 kg)   BMI 29.57 kg/m  , BMI Body mass index is 29.57 kg/m. GENERAL:  Well appearing NECK:  No jugular venous distention, waveform within normal limits, carotid upstroke brisk and symmetric, no bruits, no thyromegaly LUNGS:  Clear to auscultation bilaterally CHEST:  Well healed surgical scar, ICD   HEART:  PMI not displaced or sustained,S1 and S2 within normal limits, no S3, no S4, no clicks, no rubs, no murmurs ABD:  Flat, positive bowel sounds normal in frequency in pitch, no bruits, no rebound, no guarding, no midline pulsatile mass, no hepatomegaly, no splenomegaly EXT:  2 plus  pulses throughout, no edema, no cyanosis no clubbing   EKG:  EKG is  ordered today. Atrial fibrillation, rate 66,  right bundle branch block, no acute ST-T wave changes.   Recent Labs: 06/09/2021: ALT 20; BUN 62; Creatinine, Ser 1.79; Potassium 5.3;  Sodium 143 06/29/2021: Hemoglobin 13.3; Platelets 226    Lipid Panel    Component Value Date/Time   CHOL 111 06/09/2021 1140   TRIG 79 06/09/2021 1140   HDL 30 (L) 06/09/2021 1140   CHOLHDL 3.7 06/09/2021 1140   LDLCALC 65 06/09/2021 1140   LDLDIRECT 88 09/23/2017 1419      Wt Readings from Last 3 Encounters:  07/26/21 212 lb (96.2 kg)  07/13/21 207 lb 6.4 oz (94.1 kg)  07/06/21 211 lb 12.8 oz (96.1 kg)      Other studies Reviewed: Additional studies/ records that were reviewed today include: Labs Review of the above records demonstrates:  Please see elsewhere in the note.     ASSESSMENT AND PLAN:  CAD s/p CABG:   The patient has no new sypmtoms.  No further cardiovascular testing is indicated.  We will continue with aggressive risk reduction and meds as listed.  Of note because of his significant coronary disease as well as valvular disease I am choosing to use both Plavix and warfarin.  Chronic combined systolic and diastolic heart failure:    I did check his impedance on his device and he seems to be euvolemic.  We will try to avoid overdiuresis with his renal insufficiency.   Mechanical mitral valve: He is up-to-date with anticoagulation.     History of ICD:    He is up-to-date with follow-up and I reviewed Dr. Tanna Furry Sept note   Chronic atrial fibrillation:   He has good rate control and tolerates anticoagulation.   Hypertension:   His blood pressure is at target.  No change in therapy.   Hyperlipidemia:   His LDL was 65 with an HDL of 30.  No change in therapy.   DM: A1c is 6.5.  No change in therapy.  Current medicines are reviewed at length with the patient today.  The patient does not have concerns regarding medicines.  The following changes have been made: None  Labs/ tests ordered today include: None  Orders Placed This Encounter  Procedures   EKG 12-Lead      Disposition:   FU with me in 6 months in [place].     Signed, Minus Breeding, MD   07/26/2021 12:56 PM    [facility]

## 2021-07-26 ENCOUNTER — Ambulatory Visit (INDEPENDENT_AMBULATORY_CARE_PROVIDER_SITE_OTHER): Payer: PPO | Admitting: Cardiology

## 2021-07-26 ENCOUNTER — Encounter: Payer: Self-pay | Admitting: Cardiology

## 2021-07-26 ENCOUNTER — Other Ambulatory Visit: Payer: Self-pay

## 2021-07-26 VITALS — BP 104/68 | HR 66 | Ht 71.0 in | Wt 212.0 lb

## 2021-07-26 DIAGNOSIS — I2510 Atherosclerotic heart disease of native coronary artery without angina pectoris: Secondary | ICD-10-CM

## 2021-07-26 DIAGNOSIS — I10 Essential (primary) hypertension: Secondary | ICD-10-CM

## 2021-07-26 DIAGNOSIS — I482 Chronic atrial fibrillation, unspecified: Secondary | ICD-10-CM | POA: Diagnosis not present

## 2021-07-26 DIAGNOSIS — I5032 Chronic diastolic (congestive) heart failure: Secondary | ICD-10-CM

## 2021-07-26 DIAGNOSIS — Z9889 Other specified postprocedural states: Secondary | ICD-10-CM | POA: Diagnosis not present

## 2021-07-26 DIAGNOSIS — E785 Hyperlipidemia, unspecified: Secondary | ICD-10-CM | POA: Diagnosis not present

## 2021-07-26 NOTE — Patient Instructions (Signed)
Medication Instructions:  The current medical regimen is effective;  continue present plan and medications.  *If you need a refill on your cardiac medications before your next appointment, please call your pharmacy*  Follow-Up: At [facility], you and your health needs are our priority.  As part of our continuing mission to provide you with exceptional heart care, we have created designated Provider Care Teams.  These Care Teams include your primary Cardiologist (physician) and Advanced Practice Providers (APPs -  Physician Assistants and Nurse Practitioners) who all work together to provide you with the care you need, when you need it.  We recommend signing up for the patient portal called "MyChart".  Sign up information is provided on this After Visit Summary.  MyChart is used to connect with patients for Virtual Visits (Telemedicine).  Patients are able to view lab/test results, encounter notes, upcoming appointments, etc.  Non-urgent messages can be sent to your provider as well.   To learn more about what you can do with MyChart, go to [email or web address].    Your next appointment:   6 month(s)  The format for your next appointment:   In Person  Provider:   James Hochrein, MD   Thank you for choosing [facility]!!     

## 2021-07-27 ENCOUNTER — Other Ambulatory Visit: Payer: Self-pay | Admitting: Family Medicine

## 2021-07-27 DIAGNOSIS — I25118 Atherosclerotic heart disease of native coronary artery with other forms of angina pectoris: Secondary | ICD-10-CM

## 2021-07-27 DIAGNOSIS — I10 Essential (primary) hypertension: Secondary | ICD-10-CM

## 2021-07-27 DIAGNOSIS — I255 Ischemic cardiomyopathy: Secondary | ICD-10-CM

## 2021-08-10 ENCOUNTER — Encounter: Payer: Self-pay | Admitting: Family Medicine

## 2021-08-10 ENCOUNTER — Ambulatory Visit (INDEPENDENT_AMBULATORY_CARE_PROVIDER_SITE_OTHER): Payer: PPO | Admitting: Family Medicine

## 2021-08-10 VITALS — BP 106/59 | HR 67 | Temp 96.6°F | Ht 71.0 in | Wt 213.8 lb

## 2021-08-10 DIAGNOSIS — Z794 Long term (current) use of insulin: Secondary | ICD-10-CM | POA: Diagnosis not present

## 2021-08-10 DIAGNOSIS — E119 Type 2 diabetes mellitus without complications: Secondary | ICD-10-CM

## 2021-08-10 DIAGNOSIS — M1A0420 Idiopathic chronic gout, left hand, without tophus (tophi): Secondary | ICD-10-CM | POA: Diagnosis not present

## 2021-08-10 DIAGNOSIS — Z7901 Long term (current) use of anticoagulants: Secondary | ICD-10-CM

## 2021-08-10 LAB — COAGUCHEK XS/INR WAIVED
INR: 4.5 — ABNORMAL HIGH (ref 0.9–1.1)
Prothrombin Time: 53.6 s

## 2021-08-10 MED ORDER — METFORMIN HCL 500 MG PO TABS: 500.0000 mg | ORAL_TABLET | Freq: Every day | ORAL | 3 refills | Status: DC

## 2021-08-10 NOTE — Progress Notes (Signed)
.    Subjective:  Patient ID: John [name], male    DOB: 1951-08-21  Age: 70 y.o. MRN: [id number]  CC: Anticoagulation   HPI Tammy L Muzyka presents for Atrial fibrillation follow up. Pt. is treated with rate control and anticoagulation. Pt.  denies palpitations, rapid rate, chest pain, dyspnea and edema. There has been no bleeding from nose or gums. Pt. has not noticed blood with urine or stool.  Although there is routine bruising easily, it is not excessive. Patient recently had a gout attack at the left hand.  That has resolved.  Currently considering prophylactic therapy for future prevention.  He wants to know if the uric acid is still high.  He is not checking his glucose regularly.  He is due for refills on his metformin.  Depression screen [facility] 2/9 08/10/2021 07/13/2021 07/06/2021  Decreased Interest 0 0 0  Down, Depressed, Hopeless 0 0 0  PHQ - 2 Score 0 0 0  Altered sleeping - - -  Tired, decreased energy - - -  Change in appetite - - -  Feeling bad or failure about yourself  - - -  Trouble concentrating - - -  Moving slowly or fidgety/restless - - -  Suicidal thoughts - - -  PHQ-9 Score - - -  Difficult doing work/chores - - -  Some recent data might be hidden    History Terry has a past medical history of Abnormal nuclear stress test (05/10/2017), Allergy, Anemia, Arthritis, Atrial fibrillation (HCC), Bruises easily, CAD (coronary artery disease), CHF (congestive heart failure) ([place]), Constipation, Contrast dye induced nephropathy (07/01/2020), Enlarged prostate, Flu (09/2013), GERD (gastroesophageal reflux disease), Glaucoma, History of blood transfusion, History of colon polyps, HLD (hyperlipidemia), HTN (hypertension), Insomnia, Kidney stones, Nocturia, Peripheral edema, Peripheral neuropathy, Rheumatic fever (~ 1965), Rheumatic heart disease, S/P MVR (mitral valve replacement) (11/2000), Small bowel cancer ([place]) (2014), SOB (shortness of breath), Type II diabetes  mellitus ([place]), and Unstable angina ([place]) (06/21/2020).   He has a past surgical history that includes Mitral valve replacement (11/2000); laparotomy (N/A, 02/09/2013); Portacath placement (N/A, 02/09/2013); Coronary artery bypass graft (2002); Septoplasty; Application if wound vac (2014); Colonoscopy; Esophagogastroduodenoscopy; Laparoscopic incisional / umbilical / ventral hernia repair (03/15/2014); Port-a-cath removal (03/15/2014); Hernia repair; Cardiac catheterization (2002); Incisional hernia repair (N/A, 03/15/2014); Port-a-cath removal (Left, 03/15/2014); Insertion of mesh (N/A, 03/15/2014); RIGHT/LEFT HEART CATH AND CORONARY/GRAFT ANGIOGRAPHY (N/A, 05/10/2017); Coronary artery bypass graft; Small intestine surgery; ICD IMPLANT (N/A, 02/19/2020); RIGHT/LEFT HEART CATH AND CORONARY/GRAFT ANGIOGRAPHY (N/A, 06/23/2020); CORONARY STENT INTERVENTION (N/A, 06/24/2020); CORONARY ATHERECTOMY (N/A, 06/24/2020); and CORONARY BALLOON ANGIOPLASTY (N/A, 06/24/2020).   His family history includes Arthritis in his brother; Depression in his brother; Diabetes in his brother, brother, and mother; Heart disease in his brother and brother; Hyperlipidemia in his brother and brother; Hypertension in his brother, brother, and father; Thyroid disease in his sister.He reports that he has quit smoking. His smoking use included cigars. He has never used smokeless tobacco. He reports that he does not drink alcohol and does not use drugs.    ROS Review of Systems  Constitutional:  Negative for fever.  Respiratory:  Negative for shortness of breath.   Cardiovascular:  Negative for chest pain.  Musculoskeletal:  Negative for arthralgias.  Skin:  Negative for rash.   Objective:  BP (!) 106/59   Pulse 67   Temp (!) 96.6 F (35.9 C)   Ht 5\' 11"  (1.803 m)   Wt 213 lb 12.8 oz (97 kg)   SpO2  99%   BMI 29.82 kg/m   BP Readings from Last 3 Encounters:  08/10/21 (!) 106/59  07/26/21 104/68  07/13/21 (!) 104/56    Wt Readings from  Last 3 Encounters:  08/10/21 213 lb 12.8 oz (97 kg)  07/26/21 212 lb (96.2 kg)  07/13/21 207 lb 6.4 oz (94.1 kg)     Physical Exam Vitals reviewed.  Constitutional:      Appearance: He is well-developed.  HENT:     Head: Normocephalic and atraumatic.     Right Ear: External ear normal.     Left Ear: External ear normal.     Mouth/Throat:     Pharynx: No oropharyngeal exudate or posterior oropharyngeal erythema.  Eyes:     Pupils: Pupils are equal, round, and reactive to light.  Cardiovascular:     Rate and Rhythm: Normal rate and regular rhythm.     Heart sounds: No murmur heard. Pulmonary:     Effort: No respiratory distress.     Breath sounds: Normal breath sounds.  Musculoskeletal:     Cervical back: Normal range of motion and neck supple.  Neurological:     Mental Status: He is alert and oriented to person, place, and time.      Assessment & Plan:   [place] was seen today for anticoagulation.  Diagnoses and all orders for this visit:  Long term (current) use of anticoagulants -     CoaguChek XS/INR Waived  Type 2 diabetes mellitus without complication, with long-term current use of insulin (HCC) -     metFORMIN (GLUCOPHAGE) 500 MG tablet; Take 1 tablet (500 mg total) by mouth daily with breakfast.  Chronic gout of left hand, unspecified cause -     Uric acid      I have discontinued Nicolo L. Ludwick's amoxicillin-clavulanate. I am also having him maintain his nitroGLYCERIN, OneTouch Verio, sitaGLIPtin, insulin glargine, Entresto, furosemide, canagliflozin, sodium bicarbonate, OneTouch Delica Lancets 63A, rosuvastatin, warfarin, clopidogrel, glipiZIDE, isosorbide mononitrate, pantoprazole, colchicine, metoprolol, and metFORMIN.  Allergies as of 08/10/2021       Reactions   Doxycycline Hives   Farxiga [dapagliflozin]    Can not tolerate         Medication List        Accurate as of August 10, 2021 11:59 PM. If you have any questions, ask your  nurse or doctor.          STOP taking these medications    amoxicillin-clavulanate 875-125 MG tablet Commonly known as: AUGMENTIN Stopped by: Claretta Fraise, MD       TAKE these medications    canagliflozin 300 MG Tabs tablet Commonly known as: INVOKANA Take 300 mg by mouth every other day.   clopidogrel 75 MG tablet Commonly known as: PLAVIX TAKE 1 TABLET BY MOUTH DAILY WITH BREAKFAST.   colchicine 0.6 MG tablet Take twice daily for gout attack. (may take every two hours up to 6 doses at acute onset)   Entresto 24-26 MG Generic drug: sacubitril-valsartan Take 1 tablet by mouth 2 (two) times daily.   furosemide 40 MG tablet Commonly known as: LASIX Take 1 tablet (40 mg total) by mouth daily.   glipiZIDE 10 MG 24 hr tablet Commonly known as: GLUCOTROL XL Take 1 tablet (10 mg total) by mouth daily.   insulin glargine 100 UNIT/ML injection Commonly known as: LANTUS Inject 0.7 mLs (70 Units total) into the skin at bedtime. What changed:  when to take this additional instructions   isosorbide mononitrate 30  MG 24 hr tablet Commonly known as: IMDUR Take 1 tablet (30 mg total) by mouth daily.   metFORMIN 500 MG tablet Commonly known as: GLUCOPHAGE Take 1 tablet (500 mg total) by mouth daily with breakfast.   metoprolol 200 MG 24 hr tablet Commonly known as: TOPROL-XL TAKE 1 TABLET (200 MG TOTAL) BY MOUTH DAILY AFTER BREAKFAST.   nitroGLYCERIN 0.4 MG SL tablet Commonly known as: NITROSTAT Place 1 tablet (0.4 mg total) under the tongue every 5 (five) minutes as needed for chest pain.   OneTouch Delica Lancets 66K Misc TEST 2 TIMES DAILY AS DIRECTED   OneTouch Verio test strip Generic drug: glucose blood USE TO TEST TWICE A DAY   pantoprazole 40 MG tablet Commonly known as: PROTONIX Take 1 tablet (40 mg total) by mouth daily.   rosuvastatin 20 MG tablet Commonly known as: CRESTOR TAKE 1 TABLET BY MOUTH EVERY DAY   sitaGLIPtin 50 MG tablet Commonly  known as: Januvia Take 1 tablet (50 mg total) by mouth daily.   sodium bicarbonate 650 MG tablet Take 650 mg by mouth 2 (two) times daily.   warfarin 5 MG tablet Commonly known as: COUMADIN Take as directed by the anticoagulation clinic. If you are unsure how to take this medication, talk to your nurse or doctor. Original instructions: Take 1 tablet (5 mg total) by mouth daily.         Follow-up: Return in about 1 month (around 09/10/2021) for A fib/ coumadin.  Claretta Fraise, M.D.

## 2021-08-11 LAB — URIC ACID: Uric Acid: 9 mg/dL — ABNORMAL HIGH (ref 3.8–8.4)

## 2021-08-13 ENCOUNTER — Encounter: Payer: Self-pay | Admitting: Family Medicine

## 2021-08-16 ENCOUNTER — Other Ambulatory Visit: Payer: Self-pay | Admitting: Family Medicine

## 2021-08-16 ENCOUNTER — Telehealth: Payer: Self-pay | Admitting: Family Medicine

## 2021-08-16 MED ORDER — ALLOPURINOL 100 MG PO TABS: 100.0000 mg | ORAL_TABLET | Freq: Every day | ORAL | 3 refills | Status: DC

## 2021-08-16 NOTE — Telephone Encounter (Signed)
Patient notified and verbalized understanding. Appt made for recheck of INR

## 2021-08-16 NOTE — Telephone Encounter (Signed)
9.0 6 days ago = anything I should let him know to change?

## 2021-08-16 NOTE — Telephone Encounter (Signed)
Pt. Will need to take allopurinol due to his elevated Uric acid. This can upset his INR, so he will need to be seen in 1-2 weeks after starting the new medicine for early recheck of the INR

## 2021-08-18 ENCOUNTER — Telehealth: Payer: PPO

## 2021-08-23 ENCOUNTER — Ambulatory Visit (INDEPENDENT_AMBULATORY_CARE_PROVIDER_SITE_OTHER): Payer: PPO

## 2021-08-23 DIAGNOSIS — I255 Ischemic cardiomyopathy: Secondary | ICD-10-CM

## 2021-08-23 LAB — CUP PACEART REMOTE DEVICE CHECK
Battery Remaining Longevity: 105 mo
Battery Remaining Percentage: 85 %
Battery Voltage: 2.99 V
Brady Statistic RV Percent Paced: 1.9 %
Date Time Interrogation Session: 20221214020027
HighPow Impedance: 62 Ohm
Implantable Lead Implant Date: 20210611
Implantable Lead Location: 753860
Implantable Pulse Generator Implant Date: 20210611
Lead Channel Impedance Value: 360 Ohm
Lead Channel Pacing Threshold Amplitude: 1 V
Lead Channel Pacing Threshold Pulse Width: 0.5 ms
Lead Channel Sensing Intrinsic Amplitude: 12 mV
Lead Channel Setting Pacing Amplitude: 2 V
Lead Channel Setting Pacing Pulse Width: 0.5 ms
Lead Channel Setting Sensing Sensitivity: 0.5 mV
Pulse Gen Serial Number: 810002163

## 2021-08-25 ENCOUNTER — Encounter: Payer: Self-pay | Admitting: Family Medicine

## 2021-08-25 ENCOUNTER — Telehealth: Payer: PPO

## 2021-08-25 ENCOUNTER — Ambulatory Visit (INDEPENDENT_AMBULATORY_CARE_PROVIDER_SITE_OTHER): Payer: PPO | Admitting: Family Medicine

## 2021-08-25 VITALS — BP 114/58 | HR 66 | Temp 97.4°F | Ht 71.0 in | Wt 212.0 lb

## 2021-08-25 DIAGNOSIS — Z7901 Long term (current) use of anticoagulants: Secondary | ICD-10-CM

## 2021-08-25 DIAGNOSIS — T504X5A Adverse effect of drugs affecting uric acid metabolism, initial encounter: Secondary | ICD-10-CM | POA: Diagnosis not present

## 2021-08-25 LAB — COAGUCHEK XS/INR WAIVED
INR: 2.2 — ABNORMAL HIGH (ref 0.9–1.1)
Prothrombin Time: 26.6 s

## 2021-08-25 NOTE — Patient Instructions (Signed)
Low-Purine Eating Plan A low-purine eating plan involves making food choices to limit your intake of purine. Purine is a kind of uric acid. Too much uric acid in your blood can cause certain conditions, such as gout and kidney stones. Eating a low-purine diet can help control these conditions. What are tips for following this plan? Reading food labels Avoid foods with saturated or Trans fat. Check the ingredient list of grains-based foods, such as bread and cereal, to make sure that they contain whole grains. Check the ingredient list of sauces or soups to make sure they do not contain meat or fish. When choosing soft drinks, check the ingredient list to make sure they do not contain high-fructose corn syrup. Shopping  Buy plenty of fresh fruits and vegetables. Avoid buying canned or fresh fish. Buy dairy products labeled as low-fat or nonfat. Avoid buying premade or processed foods. These foods are often high in fat, salt (sodium), and added sugar. Cooking Use olive oil instead of butter when cooking. Oils like olive oil, canola oil, and sunflower oil contain healthy fats. Meal planning Learn which foods do or do not affect you. If you find out that a food tends to cause your gout symptoms to flare up, avoid eating that food. You can enjoy foods that do not cause problems. If you have any questions about a food item, talk with your dietitian or health care provider. Limit foods high in fat, especially saturated fat. Fat makes it harder for your body to get rid of uric acid. Choose foods that are lower in fat and are lean sources of protein. General guidelines Limit alcohol intake to no more than 1 drink a day for nonpregnant women and 2 drinks a day for men. One drink equals 12 oz of beer, 5 oz of wine, or 1 oz of hard liquor. Alcohol can affect the way your body gets rid of uric acid. Drink plenty of water to keep your urine clear or pale yellow. Fluids can help remove uric acid from your  body. If directed by your health care provider, take a vitamin C supplement. Work with your health care provider and dietitian to develop a plan to achieve or maintain a healthy weight. Losing weight can help reduce uric acid in your blood. What foods are recommended? The items listed may not be a complete list. Talk with your dietitian about what dietary choices are best for you. Foods low in purines Foods low in purines do not need to be limited. These include: All fruits. All low-purine vegetables, pickles, and olives. Breads, pasta, rice, cornbread, and popcorn. Cake and other baked goods. All dairy foods. Eggs, nuts, and nut butters. Spices and condiments, such as salt, herbs, and vinegar. Plant oils, butter, and margarine. Water, sugar-free soft drinks, tea, coffee, and cocoa. Vegetable-based soups, broths, sauces, and gravies. Foods moderate in purines Foods moderate in purines should be limited to the amounts listed.  cup of asparagus, cauliflower, spinach, mushrooms, or green peas, each day. 2/3 cup uncooked oatmeal, each day.  cup dry wheat bran or wheat germ, each day. 2-3 ounces of meat or poultry, each day. 4-6 ounces of shellfish, such as crab, lobster, oysters, or shrimp, each day. 1 cup cooked beans, peas, or lentils, each day. Soup, broths, or bouillon made from meat or fish. Limit these foods as much as possible. What foods are not recommended? The items listed may not be a complete list. Talk with your dietitian about what dietary choices are best  for you. Limit your intake of foods high in purines, including: Beer and other alcohol. Meat-based gravy or sauce. Canned or fresh fish, such as: Anchovies, sardines, herring, and tuna. Mussels and scallops. Codfish, trout, and haddock. Berniece Salines. Organ meats, such as: Liver or kidney. Tripe. Sweetbreads (thymus gland or pancreas). Wild Clinical biochemist. Yeast or yeast extract supplements. Drinks sweetened with  high-fructose corn syrup. Summary Eating a low-purine diet can help control conditions caused by too much uric acid in the body, such as gout or kidney stones. Choose low-purine foods, limit alcohol, and limit foods high in fat. You will learn over time which foods do or do not affect you. If you find out that a food tends to cause your gout symptoms to flare up, avoid eating that food. This information is not intended to replace advice given to you by your health care provider. Make sure you discuss any questions you have with your health care provider. Document Revised: 12/10/2019 Document Reviewed: 12/10/2019 Elsevier Patient Education  [place]. Gout Gout is painful swelling of your joints. Gout is a type of arthritis. It is caused by having too much uric acid in your body. Uric acid is a chemical that is made when your body breaks down substances called purines. If your body has too much uric acid, sharp crystals can form and build up in your joints. This causes pain and swelling. Gout attacks can happen quickly and be very painful (acute gout). Over time, the attacks can affect more joints and happen more often (chronic gout). What are the causes? Too much uric acid in your blood. This can happen because: Your kidneys do not remove enough uric acid from your blood. Your body makes too much uric acid. You eat too many foods that are high in purines. These foods include organ meats, some seafood, and beer. Trauma or stress. What increases the risk? Having a family history of gout. Being male and middle-aged. Being male and having gone through menopause. Being very overweight (obese). Drinking alcohol, especially beer. Not having enough water in the body (being dehydrated). Losing weight too quickly. Having an organ transplant. Having lead poisoning. Taking certain medicines. Having kidney disease. Having a skin condition called psoriasis. What are the signs or  symptoms? An attack of acute gout usually happens in just one joint. The most common place is the big toe. Attacks often start at night. Other joints that may be affected include joints of the feet, ankle, knee, fingers, wrist, or elbow. Symptoms of an attack may include: Very bad pain. Warmth. Swelling. Stiffness. Shiny, red, or purple skin. Tenderness. The affected joint may be very painful to touch. Chills and fever. Chronic gout may cause symptoms more often. More joints may be involved. You may also have white or yellow lumps (tophi) on your hands or feet or in other areas near your joints. How is this treated? Treatment for this condition has two phases: treating an acute attack and preventing future attacks. Acute gout treatment may include: NSAIDs. Steroids. These are taken by mouth or injected into a joint. Colchicine. This medicine relieves pain and swelling. It can be given by mouth or through an IV tube. Preventive treatment may include: Taking small doses of NSAIDs or colchicine daily. Using a medicine that reduces uric acid levels in your blood. Making changes to your diet. You may need to see a food expert (dietitian) about what to eat and drink to prevent gout. Follow these instructions at home: During  a gout attack  If told, put ice on the painful area: Put ice in a plastic bag. Place a towel between your skin and the bag. Leave the ice on for 20 minutes, 2-3 times a day. Raise (elevate) the painful joint above the level of your heart as often as you can. Rest the joint as much as possible. If the joint is in your leg, you may be given crutches. Follow instructions from your doctor about what you cannot eat or drink. Avoiding future gout attacks Eat a low-purine diet. Avoid foods and drinks such as: Liver. Kidney. Anchovies. Asparagus. Herring. Mushrooms. Mussels. Beer. Stay at a healthy weight. If you want to lose weight, talk with your doctor. Do not lose  weight too fast. Start or continue an exercise plan as told by your doctor. Eating and drinking Drink enough fluids to keep your pee (urine) pale yellow. If you drink alcohol: Limit how much you use to: 0-1 drink a day for women. 0-2 drinks a day for men. Be aware of how much alcohol is in your drink. In the [place] one drink equals one 12 oz bottle of beer (355 mL), one 5 oz glass of wine (148 mL), or one 1 oz glass of hard liquor (44 mL). General instructions Take over-the-counter and prescription medicines only as told by your doctor. Do not drive or use heavy machinery while taking prescription pain medicine. Return to your normal activities as told by your doctor. Ask your doctor what activities are safe for you. Keep all follow-up visits as told by your doctor. This is important. Contact a doctor if: You have another gout attack. You still have symptoms of a gout attack after 10 days of treatment. You have problems (side effects) because of your medicines. You have chills or a fever. You have burning pain when you pee (urinate). You have pain in your lower back or belly. Get help right away if: You have very bad pain. Your pain cannot be controlled. You cannot pee. Summary Gout is painful swelling of the joints. The most common site of pain is the big toe, but it can affect other joints. Medicines and avoiding some foods can help to prevent and treat gout attacks. This information is not intended to replace advice given to you by your health care provider. Make sure you discuss any questions you have with your health care provider. Document Revised: 03/07/2018 Document Reviewed: 03/19/2018 Elsevier Patient Education  [place].

## 2021-08-25 NOTE — Progress Notes (Signed)
Subjective:  Patient ID: John [name], male    DOB: [birth year]  Age: 70 y.o. MRN: [id number]  CC: Anticoagulation    HPI John [name] presents for intolerance of allopurinol. Was originally scheduled for INR due to starting the medication. HE only took it two days due to side effects. He felt short of breath while taking it. Chest felt tight. Resolved after DC of the med.   Depression screen [facility] 2/9 08/25/2021 08/10/2021 07/13/2021  Decreased Interest 0 0 0  Down, Depressed, Hopeless 0 0 0  PHQ - 2 Score 0 0 0  Altered sleeping - - -  Tired, decreased energy - - -  Change in appetite - - -  Feeling bad or failure about yourself  - - -  Trouble concentrating - - -  Moving slowly or fidgety/restless - - -  Suicidal thoughts - - -  PHQ-9 Score - - -  Difficult doing work/chores - - -  Some recent data might be hidden    History John [name] has a past medical history of Abnormal nuclear stress test (05/10/2017), Allergy, Anemia, Arthritis, Atrial fibrillation (HCC), Bruises easily, CAD (coronary artery disease), CHF (congestive heart failure) ([place]), Constipation, 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   He has a past surgical history that includes Mitral valve replacement (11/2000); laparotomy (N/A, 02/09/2013); Portacath placement (N/A, 02/09/2013); Coronary artery bypass graft (2002); Septoplasty; Application if wound vac (2014); Colonoscopy; Esophagogastroduodenoscopy; Laparoscopic incisional / umbilical /  ventral hernia repair (03/15/2014); Port-a-cath removal (03/15/2014); Hernia repair; Cardiac catheterization (2002); Incisional hernia repair (N/A, 03/15/2014); Port-a-cath removal (Left, 03/15/2014); Insertion of mesh (N/A, 03/15/2014); RIGHT/LEFT HEART CATH AND CORONARY/GRAFT ANGIOGRAPHY (N/A, 05/10/2017); Coronary artery bypass graft; Small intestine surgery; ICD IMPLANT (N/A, 02/19/2020); RIGHT/LEFT HEART CATH AND CORONARY/GRAFT ANGIOGRAPHY (N/A, 06/23/2020); CORONARY STENT INTERVENTION (N/A, 06/24/2020); CORONARY ATHERECTOMY (N/A, 06/24/2020); and CORONARY BALLOON ANGIOPLASTY (N/A, 06/24/2020).   His family history includes Arthritis in his brother; Depression in his brother; Diabetes in his brother, brother, and mother; Heart disease in his brother and brother; Hyperlipidemia in his brother and brother; Hypertension in his brother, brother, and father; Thyroid disease in his sister.He reports that he has quit smoking. His smoking use included cigars. He has never used smokeless tobacco. He reports that he does not drink alcohol and does not use drugs.    ROS Review of Systems  Constitutional:  Negative for fever.  Respiratory:  Negative for shortness of breath.   Cardiovascular:  Negative for chest pain.  Musculoskeletal:  Negative for arthralgias.  Skin:  Negative for rash.   Objective:  BP (!) 114/58    Pulse 66    Temp (!) 97.4 F (36.3 C) (Temporal)    Ht 5\' 11"  (1.803 m)    Wt 212 lb (96.2 kg)    BMI 29.57 kg/m   BP Readings from Last 3 Encounters:  08/25/21 (!) 114/58  08/10/21 (!) 106/59  07/26/21 104/68    Wt Readings from Last 3 Encounters:  08/25/21 212 lb (96.2 kg)  08/10/21 213 lb 12.8 oz (97 kg)  07/26/21 212 lb (96.2 kg)     Physical Exam Vitals reviewed.  Constitutional:  Appearance: He is well-developed.  HENT:     Head: Normocephalic and atraumatic.     Right Ear: External ear normal.     Left Ear: External ear normal.     Mouth/Throat:     Pharynx: No  oropharyngeal exudate or posterior oropharyngeal erythema.  Eyes:     Pupils: Pupils are equal, round, and reactive to light.  Cardiovascular:     Rate and Rhythm: Normal rate and regular rhythm.     Heart sounds: No murmur heard. Pulmonary:     Effort: No respiratory distress.     Breath sounds: Normal breath sounds.  Musculoskeletal:     Cervical back: Normal range of motion and neck supple.  Neurological:     Mental Status: He is alert and oriented to person, place, and time.      Assessment & Plan:   John [name] was seen today for anticoagulation.  Diagnoses and all orders for this visit:  Long term (current) use of anticoagulants -     CoaguChek XS/INR Waived  Adverse effect of allopurinol, initial encounter  Other orders -     Febuxostat (ULORIC) 80 MG TABS; Take 1 tablet (80 mg total) by mouth daily.       I have discontinued John [name]'s allopurinol. I am also having him start on Febuxostat. Additionally, I am having him maintain his nitroGLYCERIN, OneTouch Verio, sitaGLIPtin, insulin glargine, Entresto, furosemide, canagliflozin, sodium bicarbonate, OneTouch Delica Lancets 78M, rosuvastatin, warfarin, clopidogrel, glipiZIDE, isosorbide mononitrate, pantoprazole, colchicine, metoprolol, and metFORMIN.  Allergies as of 08/25/2021       Reactions   Allopurinol Shortness Of Breath   Doxycycline Hives   Farxiga [dapagliflozin]    Can not tolerate         Medication List        Accurate as of August 25, 2021 11:59 PM. If you have any questions, ask your nurse or doctor.          STOP taking these medications    allopurinol 100 MG tablet Commonly known as: ZYLOPRIM Stopped by: Claretta Fraise, MD       TAKE these medications    canagliflozin 300 MG Tabs tablet Commonly known as: INVOKANA Take 300 mg by mouth every other day.   clopidogrel 75 MG tablet Commonly known as: PLAVIX TAKE 1 TABLET BY MOUTH DAILY WITH BREAKFAST.   colchicine  0.6 MG tablet Take twice daily for gout attack. (may take every two hours up to 6 doses at acute onset)   Entresto 24-26 MG Generic drug: sacubitril-valsartan Take 1 tablet by mouth 2 (two) times daily.   Febuxostat 80 MG Tabs Commonly known as: Uloric Take 1 tablet (80 mg total) by mouth daily. Started by: Claretta Fraise, MD   furosemide 40 MG tablet Commonly known as: LASIX Take 1 tablet (40 mg total) by mouth daily.   glipiZIDE 10 MG 24 hr tablet Commonly known as: GLUCOTROL XL Take 1 tablet (10 mg total) by mouth daily.   insulin glargine 100 UNIT/ML injection Commonly known as: LANTUS Inject 0.7 mLs (70 Units total) into the skin at bedtime. What changed:  when to take this additional instructions   isosorbide mononitrate 30 MG 24 hr tablet Commonly known as: IMDUR Take 1 tablet (30 mg total) by mouth daily.   metFORMIN 500 MG tablet Commonly known as: GLUCOPHAGE Take 1 tablet (500 mg total) by mouth daily with breakfast.   metoprolol 200 MG 24 hr tablet Commonly known as: TOPROL-XL TAKE 1 TABLET (  200 MG TOTAL) BY MOUTH DAILY AFTER BREAKFAST.   nitroGLYCERIN 0.4 MG SL tablet Commonly known as: NITROSTAT Place 1 tablet (0.4 mg total) under the tongue every 5 (five) minutes as needed for chest pain.   OneTouch Delica Lancets 90X Misc TEST 2 TIMES DAILY AS DIRECTED   OneTouch Verio test strip Generic drug: glucose blood USE TO TEST TWICE A DAY   pantoprazole 40 MG tablet Commonly known as: PROTONIX Take 1 tablet (40 mg total) by mouth daily.   rosuvastatin 20 MG tablet Commonly known as: CRESTOR TAKE 1 TABLET BY MOUTH EVERY DAY   sitaGLIPtin 50 MG tablet Commonly known as: Januvia Take 1 tablet (50 mg total) by mouth daily.   sodium bicarbonate 650 MG tablet Take 650 mg by mouth 2 (two) times daily.   warfarin 5 MG tablet Commonly known as: COUMADIN Take as directed by the anticoagulation clinic. If you are unsure how to take this medication, talk  to your nurse or doctor. Original instructions: Take 1 tablet (5 mg total) by mouth daily.         Follow-up: Return in about 3 weeks (around 09/15/2021).  Claretta Fraise, M.D.

## 2021-08-27 MED ORDER — FEBUXOSTAT 80 MG PO TABS: 80.0000 mg | ORAL_TABLET | Freq: Every day | ORAL | 1 refills | Status: DC

## 2021-09-01 NOTE — Progress Notes (Signed)
Remote ICD transmission.   

## 2021-09-14 ENCOUNTER — Encounter: Payer: Self-pay | Admitting: Family Medicine

## 2021-09-14 ENCOUNTER — Ambulatory Visit (INDEPENDENT_AMBULATORY_CARE_PROVIDER_SITE_OTHER): Payer: PPO | Admitting: Family Medicine

## 2021-09-14 ENCOUNTER — Other Ambulatory Visit: Payer: Self-pay

## 2021-09-14 ENCOUNTER — Telehealth: Payer: Self-pay | Admitting: Family Medicine

## 2021-09-14 VITALS — BP 114/61 | HR 78 | Temp 97.1°F | Ht 71.0 in | Wt 215.4 lb

## 2021-09-14 DIAGNOSIS — I4819 Other persistent atrial fibrillation: Secondary | ICD-10-CM | POA: Diagnosis not present

## 2021-09-14 DIAGNOSIS — Z7901 Long term (current) use of anticoagulants: Secondary | ICD-10-CM | POA: Diagnosis not present

## 2021-09-14 DIAGNOSIS — Z794 Long term (current) use of insulin: Secondary | ICD-10-CM | POA: Diagnosis not present

## 2021-09-14 DIAGNOSIS — I5032 Chronic diastolic (congestive) heart failure: Secondary | ICD-10-CM | POA: Diagnosis not present

## 2021-09-14 DIAGNOSIS — M1A0420 Idiopathic chronic gout, left hand, without tophus (tophi): Secondary | ICD-10-CM | POA: Diagnosis not present

## 2021-09-14 DIAGNOSIS — E119 Type 2 diabetes mellitus without complications: Secondary | ICD-10-CM | POA: Diagnosis not present

## 2021-09-14 DIAGNOSIS — I25118 Atherosclerotic heart disease of native coronary artery with other forms of angina pectoris: Secondary | ICD-10-CM

## 2021-09-14 LAB — BAYER DCA HB A1C WAIVED: HB A1C (BAYER DCA - WAIVED): 6.9 % — ABNORMAL HIGH (ref 4.8–5.6)

## 2021-09-14 LAB — POCT INR: INR: 1.8 — AB (ref 2.0–3.0)

## 2021-09-14 LAB — COAGUCHEK XS/INR WAIVED
INR: 1.8 — ABNORMAL HIGH (ref 0.9–1.1)
Prothrombin Time: 22.2 s

## 2021-09-14 NOTE — Telephone Encounter (Signed)
John [name] called to let Dr Livia Snellen know that the new gout medicine that was called in for him is not covered by his insurance. Was told by pharmacy that it would cost $250 to fill.  Please advise.

## 2021-09-14 NOTE — Progress Notes (Signed)
Subjective:  Patient ID: John [name], male    DOB: 13-Mar-1951  Age: 71 y.o. MRN: [id number]  CC: Medical Management of Chronic Issues, Diabetes, and Gout   HPI Kevon L Herrin presents for Atrial fibrillation follow up. Pt. is treated with rate control and anticoagulation. Pt.  denies palpitations, rapid rate, chest pain. Having nocturnal dyspnea with orthopnea  and edema. Drinking more water to flush the uric acid out. There has been no bleeding from nose or gums. Pt. has not noticed blood with urine or stool.  Although there is routine bruising easily, it is not excessive.  Has been on a gout diet emphasizing fish chicken and salads. His gout flare from last month started after eating a lot of chicken liver.      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   He has a past surgical history that includes Mitral valve replacement (11/2000); laparotomy (N/A, 02/09/2013); Portacath placement (N/A, 02/09/2013); Coronary artery bypass graft (2002); Septoplasty; Application if wound vac (2014); Colonoscopy; Esophagogastroduodenoscopy; Laparoscopic incisional / umbilical / ventral hernia repair (03/15/2014); Port-a-cath removal (03/15/2014); Hernia repair; Cardiac catheterization (2002);  Incisional hernia repair (N/A, 03/15/2014); Port-a-cath removal (Left, 03/15/2014); Insertion of mesh (N/A, 03/15/2014); RIGHT/LEFT HEART CATH AND CORONARY/GRAFT ANGIOGRAPHY (N/A, 05/10/2017); Coronary artery bypass graft; Small intestine surgery; ICD IMPLANT (N/A, 02/19/2020); RIGHT/LEFT HEART CATH AND CORONARY/GRAFT ANGIOGRAPHY (N/A, 06/23/2020); CORONARY STENT INTERVENTION (N/A, 06/24/2020); CORONARY ATHERECTOMY (N/A, 06/24/2020); and CORONARY BALLOON ANGIOPLASTY (N/A, 06/24/2020).   His family history includes Arthritis in his brother; Depression in his brother; Diabetes in his brother, brother, and mother; Heart disease in his brother and brother; Hyperlipidemia in his brother and brother; Hypertension in his brother, brother, and father; Thyroid disease in his sister.He reports that he has quit smoking. His smoking use included cigars. He has never used smokeless tobacco. He reports that he does not drink alcohol and does not use drugs.    ROS Review of Systems  Constitutional:  Negative for fever.  Respiratory:  Negative for shortness of breath.   Cardiovascular:  Negative for chest pain.  Musculoskeletal:  Negative for arthralgias.  Skin:  Negative for rash.   Objective:  BP 114/61    Pulse 78    Temp (!) 97.1 F (36.2 C) (Temporal)    Ht 5\' 11"  (1.803 m)    Wt 215 lb 6 oz (97.7 kg)    SpO2 99%    BMI 30.04 kg/m   BP Readings from Last 3 Encounters:  09/14/21 114/61  08/25/21 (!) 114/58  08/10/21 (!) 106/59    Wt Readings from Last 3 Encounters:  09/14/21 215 lb 6 oz (97.7 kg)  08/25/21 212 lb (96.2 kg)  08/10/21 213 lb 12.8 oz (97 kg)     Physical Exam Constitutional:      General: He is not in acute distress.    Appearance: He is well-developed.  HENT:     Head: Normocephalic and atraumatic.     Right Ear: External  ear normal.     Left Ear: External ear normal.     Nose: Nose normal.  Eyes:     Conjunctiva/sclera: Conjunctivae normal.     Pupils: Pupils are equal, round,  and reactive to light.  Cardiovascular:     Rate and Rhythm: Normal rate and regular rhythm.     Heart sounds: Normal heart sounds. No murmur heard. Pulmonary:     Effort: Pulmonary effort is normal. No respiratory distress.     Breath sounds: Normal breath sounds. No wheezing or rales.  Abdominal:     Palpations: Abdomen is soft.     Tenderness: There is no abdominal tenderness.  Musculoskeletal:        General: Normal range of motion.     Cervical back: Normal range of motion and neck supple.  Skin:    General: Skin is warm and dry.  Neurological:     Mental Status: He is alert and oriented to person, place, and time.     Deep Tendon Reflexes: Reflexes are normal and symmetric.  Psychiatric:        Behavior: Behavior normal.        Thought Content: Thought content normal.        Judgment: Judgment normal.      Assessment & Plan:   Carmel was seen today for medical management of chronic issues, diabetes and gout.  Diagnoses and all orders for this visit:  Long term (current) use of anticoagulants -     CoaguChek XS/INR Waived  Type 2 diabetes mellitus without complication, with long-term current use of insulin (HCC) -     Bayer DCA Hb A1c Waived -     CBC with Differential/Platelet -     CMP14+EGFR  Chronic gout of left hand, unspecified cause -     Uric Acid -     CMP14+EGFR  Persistent atrial fibrillation (HCC)  Chronic diastolic HF (heart failure) (HCC)  Coronary artery disease of native artery of native heart with stable angina pectoris ([place])  Other orders -     POCT INR       I am having Shaine L. Mcgilvery maintain his nitroGLYCERIN, OneTouch Verio, sitaGLIPtin, insulin glargine, Entresto, furosemide, canagliflozin, sodium bicarbonate, OneTouch Delica Lancets 16X, rosuvastatin, warfarin, clopidogrel, glipiZIDE, isosorbide mononitrate, pantoprazole, colchicine, metoprolol, metFORMIN, and Febuxostat.  Allergies as of 09/14/2021       Reactions    Allopurinol Shortness Of Breath   Doxycycline Hives   Farxiga [dapagliflozin]    Can not tolerate         Medication List        Accurate as of September 14, 2021  9:11 AM. If you have any questions, ask your nurse or doctor.          canagliflozin 300 MG Tabs tablet Commonly known as: INVOKANA Take 300 mg by mouth every other day.   clopidogrel 75 MG tablet Commonly known as: PLAVIX TAKE 1 TABLET BY MOUTH DAILY WITH BREAKFAST.   colchicine 0.6 MG tablet Take twice daily for gout attack. (may take every two hours up to 6 doses at acute onset)   Entresto 24-26 MG Generic drug: sacubitril-valsartan Take 1 tablet by mouth 2 (two) times daily.   Febuxostat 80 MG Tabs Commonly known as: Uloric Take 1 tablet (80 mg total) by mouth daily.   furosemide 40 MG tablet Commonly known as: LASIX Take 1 tablet (40 mg total) by mouth daily.   glipiZIDE 10 MG 24 hr tablet Commonly known  as: GLUCOTROL XL Take 1 tablet (10 mg total) by mouth daily.   insulin glargine 100 UNIT/ML injection Commonly known as: LANTUS Inject 0.7 mLs (70 Units total) into the skin at bedtime. What changed:  when to take this additional instructions   isosorbide mononitrate 30 MG 24 hr tablet Commonly known as: IMDUR Take 1 tablet (30 mg total) by mouth daily.   metFORMIN 500 MG tablet Commonly known as: GLUCOPHAGE Take 1 tablet (500 mg total) by mouth daily with breakfast.   metoprolol 200 MG 24 hr tablet Commonly known as: TOPROL-XL TAKE 1 TABLET (200 MG TOTAL) BY MOUTH DAILY AFTER BREAKFAST.   nitroGLYCERIN 0.4 MG SL tablet Commonly known as: NITROSTAT Place 1 tablet (0.4 mg total) under the tongue every 5 (five) minutes as needed for chest pain.   OneTouch Delica Lancets 16X Misc TEST 2 TIMES DAILY AS DIRECTED   OneTouch Verio test strip Generic drug: glucose blood USE TO TEST TWICE A DAY   pantoprazole 40 MG tablet Commonly known as: PROTONIX Take 1 tablet (40 mg total) by mouth  daily.   rosuvastatin 20 MG tablet Commonly known as: CRESTOR TAKE 1 TABLET BY MOUTH EVERY DAY   sitaGLIPtin 50 MG tablet Commonly known as: Januvia Take 1 tablet (50 mg total) by mouth daily.   sodium bicarbonate 650 MG tablet Take 650 mg by mouth 2 (two) times daily.   warfarin 5 MG tablet Commonly known as: COUMADIN Take as directed by the anticoagulation clinic. If you are unsure how to take this medication, talk to your nurse or doctor. Original instructions: Take 1 tablet (5 mg total) by mouth daily.         Follow-up: No follow-ups on file.  Claretta Fraise, M.D.

## 2021-09-14 NOTE — Telephone Encounter (Signed)
Please do prior auth based on dx of gout and intolerance of allopurinol which caused chest pain and shortness of breath

## 2021-09-15 LAB — CBC WITH DIFFERENTIAL/PLATELET
Basophils Absolute: 0 10*3/uL (ref 0.0–0.2)
Basos: 1 %
EOS (ABSOLUTE): 0.2 10*3/uL (ref 0.0–0.4)
Eos: 3 %
Hematocrit: 43.4 % (ref 37.5–51.0)
Hemoglobin: 13.6 g/dL (ref 13.0–17.7)
Immature Grans (Abs): 0 10*3/uL (ref 0.0–0.1)
Immature Granulocytes: 0 %
Lymphocytes Absolute: 1.2 10*3/uL (ref 0.7–3.1)
Lymphs: 19 %
MCH: 25.6 pg — ABNORMAL LOW (ref 26.6–33.0)
MCHC: 31.3 g/dL — ABNORMAL LOW (ref 31.5–35.7)
MCV: 82 fL (ref 79–97)
Monocytes Absolute: 0.9 10*3/uL (ref 0.1–0.9)
Monocytes: 14 %
Neutrophils Absolute: 3.9 10*3/uL (ref 1.4–7.0)
Neutrophils: 63 %
Platelets: 171 10*3/uL (ref 150–450)
RBC: 5.31 x10E6/uL (ref 4.14–5.80)
RDW: 16.8 % — ABNORMAL HIGH (ref 11.6–15.4)
WBC: 6.2 10*3/uL (ref 3.4–10.8)

## 2021-09-15 LAB — CMP14+EGFR
ALT: 17 IU/L (ref 0–44)
AST: 26 IU/L (ref 0–40)
Albumin/Globulin Ratio: 1.5 (ref 1.2–2.2)
Albumin: 4.2 g/dL (ref 3.8–4.8)
Alkaline Phosphatase: 179 IU/L — ABNORMAL HIGH (ref 44–121)
BUN/Creatinine Ratio: 34 — ABNORMAL HIGH (ref 10–24)
BUN: 73 mg/dL — ABNORMAL HIGH (ref 8–27)
Bilirubin Total: 1.2 mg/dL (ref 0.0–1.2)
CO2: 23 mmol/L (ref 20–29)
Calcium: 9.6 mg/dL (ref 8.6–10.2)
Chloride: 101 mmol/L (ref 96–106)
Creatinine, Ser: 2.12 mg/dL — ABNORMAL HIGH (ref 0.76–1.27)
Globulin, Total: 2.8 g/dL (ref 1.5–4.5)
Glucose: 86 mg/dL (ref 70–99)
Potassium: 5 mmol/L (ref 3.5–5.2)
Sodium: 141 mmol/L (ref 134–144)
Total Protein: 7 g/dL (ref 6.0–8.5)
eGFR: 33 mL/min/{1.73_m2} — ABNORMAL LOW (ref >59)

## 2021-09-15 LAB — URIC ACID: Uric Acid: 8.8 mg/dL — ABNORMAL HIGH (ref 3.8–8.4)

## 2021-09-15 NOTE — Telephone Encounter (Signed)
PA COMPLETED VIA COVER MY MEDS   [employer] The plan will fax you a determination, typically within 1 to 5 business days.

## 2021-09-18 NOTE — Telephone Encounter (Signed)
This Drug was approved - from 09/15/2021 to 10/10/2021. Per statement we will have to submit another PA via CMM at that time.  Pharm aware

## 2021-09-22 ENCOUNTER — Telehealth: Payer: PPO

## 2021-09-27 ENCOUNTER — Telehealth: Payer: Self-pay | Admitting: Pharmacist

## 2021-09-27 NOTE — Telephone Encounter (Signed)
Assisted with the following patient assistance applications Patient not amenable to changes in diabetes regimen  Invokana -- [employer] PAP Januvia -- [employer] PAP Uloric -- [employer]akeda PAP Lantus -- sanofi PAP  Will follow

## 2021-09-29 ENCOUNTER — Other Ambulatory Visit: Payer: Self-pay | Admitting: Family Medicine

## 2021-09-29 DIAGNOSIS — E119 Type 2 diabetes mellitus without complications: Secondary | ICD-10-CM

## 2021-10-05 ENCOUNTER — Other Ambulatory Visit: Payer: Self-pay

## 2021-10-05 ENCOUNTER — Inpatient Hospital Stay: Payer: PPO | Attending: Oncology | Admitting: Oncology

## 2021-10-05 VITALS — BP 113/60 | HR 74 | Temp 97.5°F | Resp 20 | Wt 211.7 lb

## 2021-10-05 DIAGNOSIS — D631 Anemia in chronic kidney disease: Secondary | ICD-10-CM | POA: Insufficient documentation

## 2021-10-05 DIAGNOSIS — N189 Chronic kidney disease, unspecified: Secondary | ICD-10-CM | POA: Insufficient documentation

## 2021-10-05 DIAGNOSIS — C8590 Non-Hodgkin lymphoma, unspecified, unspecified site: Secondary | ICD-10-CM

## 2021-10-05 DIAGNOSIS — Z8572 Personal history of non-Hodgkin lymphomas: Secondary | ICD-10-CM | POA: Insufficient documentation

## 2021-10-05 NOTE — Progress Notes (Signed)
Hematology and Oncology Follow Up Visit  SY SAINTJEAN [id number] [birth year] 71 y.o. 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 He is status post 4 cycles of chemotherapy completed in 06/2013.  Current therapy: Active surveillance.  Interim History:  John [name] presents today for a follow-up visit.  Since the last visit, he reports no major changes in his health.  He was diagnosed with gout which has improved at this time.  He denies any recent hospitalizations or illnesses.  He denies any chest pain, shortness of breath or cough.  He denies any wheezing or hemoptysis.  His performance status and quality of life remains unchanged.     Medications: Updated on review. Current Outpatient Medications  Medication Sig Dispense Refill   canagliflozin (INVOKANA) 300 MG TABS tablet Take 300 mg by mouth every other day.     clopidogrel (PLAVIX) 75 MG tablet TAKE 1 TABLET BY MOUTH DAILY WITH BREAKFAST. 90 tablet 3   colchicine 0.6 MG tablet Take twice daily for gout attack. (may take every two hours up to 6 doses at acute onset) 60 tablet 2   Febuxostat (ULORIC) 80 MG TABS Take 1 tablet (80 mg total) by mouth daily. (Patient not taking: Reported on 09/14/2021) 90 tablet 1   furosemide (LASIX) 40 MG tablet Take 1 tablet (40 mg total) by mouth daily. 90 tablet 2   glipiZIDE (GLUCOTROL XL) 10 MG 24 hr tablet Take 1 tablet (10 mg total) by mouth daily. 90 tablet 3   glucose blood (ONETOUCH VERIO) test strip USE TO TEST TWICE A DAY 200 strip 3   insulin glargine (LANTUS) 100 UNIT/ML injection Inject 0.7 mLs (70 Units total) into the skin at bedtime. (Patient taking differently: Inject 70 Units  into the skin. Take 70 units if over 180 at bedtime) 10 mL 11   isosorbide mononitrate (IMDUR) 30 MG 24 hr tablet Take 1 tablet (30 mg total) by mouth daily. 90 tablet 3   JANUVIA 50 MG tablet TAKE ONE TABLET BY MOUTH EVERY DAY 90 tablet 0   metFORMIN (GLUCOPHAGE) 500 MG tablet Take 1 tablet (500 mg total) by mouth daily with breakfast. 90 tablet 3   metoprolol (TOPROL-XL) 200 MG 24 hr tablet TAKE 1 TABLET (200 MG TOTAL) BY MOUTH DAILY AFTER BREAKFAST. 90 tablet 0   nitroGLYCERIN (NITROSTAT) 0.4 MG SL tablet Place 1 tablet (0.4 mg total) under the tongue every 5 (five) minutes as needed for chest pain. 50 tablet 10   OneTouch Delica Lancets 88B MISC TEST 2 TIMES DAILY AS DIRECTED 100 each 5   pantoprazole (PROTONIX) 40 MG tablet Take 1 tablet (40 mg total) by mouth daily. 90 tablet 3   rosuvastatin (CRESTOR) 20 MG tablet TAKE 1 TABLET BY MOUTH EVERY DAY 90 tablet 3   sacubitril-valsartan (ENTRESTO) 24-26 MG Take 1 tablet by mouth 2 (two) times daily. 180 tablet 3   sodium bicarbonate 650 MG tablet Take 650 mg by mouth 2 (two) times daily.     warfarin (COUMADIN) 5 MG tablet Take 1 tablet (5 mg total) by mouth daily. 90 tablet 3   No current facility-administered medications for this visit.     Allergies:  Allergies  Allergen Reactions   Allopurinol Shortness Of  Breath   Doxycycline Hives   Farxiga [Dapagliflozin]     Can not tolerate     .    Physical Exam:  Blood pressure 113/60, pulse 74, temperature (!) 97.5 F (36.4 C), resp. rate 20, weight 211 lb 11.2 oz (96 kg), SpO2 100 %.    ECOG: 1    General appearance: Alert, awake without any distress. Head: Atraumatic without abnormalities Oropharynx: Without any thrush or ulcers. Eyes: No scleral icterus. Lymph nodes: No lymphadenopathy noted in the cervical, supraclavicular, or axillary nodes Heart:regular rate and rhythm, without any murmurs or gallops.   Lung: Clear to auscultation without any rhonchi, wheezes or  dullness to percussion. Abdomin: Soft, nontender without any shifting dullness or ascites. Musculoskeletal: No clubbing or cyanosis. Neurological: No motor or sensory deficits. Skin: No rashes or lesions.     Lab Results: Lab Results  Component Value Date   WBC 6.2 09/14/2021   HGB 13.6 09/14/2021   HCT 43.4 09/14/2021   MCV 82 09/14/2021   PLT 171 09/14/2021     Chemistry      Component Value Date/Time   NA 141 09/14/2021 0817   NA 141 07/09/2017 0818   K 5.0 09/14/2021 0817   K 4.9 07/09/2017 0818   CL 101 09/14/2021 0817   CL 106 01/21/2013 1050   CO2 23 09/14/2021 0817   CO2 21 (L) 07/09/2017 0818   BUN 73 (H) 09/14/2021 0817   BUN 44.9 (H) 07/09/2017 0818   CREATININE 2.12 (H) 09/14/2021 0817   CREATININE 1.40 (H) 07/07/2019 0740   CREATININE 1.4 (H) 07/09/2017 0818      Component Value Date/Time   CALCIUM 9.6 09/14/2021 0817   CALCIUM 9.1 07/09/2017 0818   ALKPHOS 179 (H) 09/14/2021 0817   ALKPHOS 104 07/09/2017 0818   AST 26 09/14/2021 0817   AST 20 07/07/2019 0740   AST 22 07/09/2017 0818   ALT 17 09/14/2021 0817   ALT 19 07/07/2019 0740   ALT 19 07/09/2017 0818   BILITOT 1.2 09/14/2021 0817   BILITOT 0.5 07/07/2019 0740   BILITOT 0.50 07/09/2017 0818       Impression and Plan:  71 year old man with:   1.  Stage IIa diffuse large cell lymphoma diagnosed in 2014 after presenting with abdominal adenopathy.  He is currently on active surveillance after achieving remission with systemic therapy.  Risk of relapse was assessed at this time and overall his risk of relapse appears low.  He will continue on active surveillance and reinstitute salvage therapy if he has relapsed disease.  2. Anemia: Hemoglobin close to baseline.  His anemia is related to chronic renal failure.  3. Chronic renal insufficiency: He continues to follow with nephrology regarding this issue.  4. Followup: He will return in 12 months for repeat follow-up.  20  minutes were  spent on this encounter.  The time was dedicated to reviewing laboratory data, disease status update, treatment choices and future plan of care discussion.  John [name] 1/26/20239:43 AM

## 2021-10-20 DIAGNOSIS — R809 Proteinuria, unspecified: Secondary | ICD-10-CM | POA: Diagnosis not present

## 2021-10-20 DIAGNOSIS — I129 Hypertensive chronic kidney disease with stage 1 through stage 4 chronic kidney disease, or unspecified chronic kidney disease: Secondary | ICD-10-CM | POA: Diagnosis not present

## 2021-10-20 DIAGNOSIS — D631 Anemia in chronic kidney disease: Secondary | ICD-10-CM | POA: Diagnosis not present

## 2021-10-20 DIAGNOSIS — N183 Chronic kidney disease, stage 3 unspecified: Secondary | ICD-10-CM | POA: Diagnosis not present

## 2021-10-20 DIAGNOSIS — E872 Acidosis, unspecified: Secondary | ICD-10-CM | POA: Diagnosis not present

## 2021-10-20 DIAGNOSIS — I5033 Acute on chronic diastolic (congestive) heart failure: Secondary | ICD-10-CM | POA: Diagnosis not present

## 2021-10-20 DIAGNOSIS — E785 Hyperlipidemia, unspecified: Secondary | ICD-10-CM | POA: Diagnosis not present

## 2021-10-20 DIAGNOSIS — N2581 Secondary hyperparathyroidism of renal origin: Secondary | ICD-10-CM | POA: Diagnosis not present

## 2021-10-20 NOTE — Progress Notes (Signed)
Received notification from [facility] regarding approval for LANTUS SOLOSTAR. Patient assistance approved from 10/19/21 to 09/09/22.  MEDICATION SHOULD SHIP TO OFFICE IN 3-5 BUSINESS DAYS  Phone: [phone]

## 2021-10-22 ENCOUNTER — Other Ambulatory Visit: Payer: Self-pay | Admitting: Family Medicine

## 2021-10-22 DIAGNOSIS — I255 Ischemic cardiomyopathy: Secondary | ICD-10-CM

## 2021-10-22 DIAGNOSIS — I25118 Atherosclerotic heart disease of native coronary artery with other forms of angina pectoris: Secondary | ICD-10-CM

## 2021-10-22 DIAGNOSIS — I10 Essential (primary) hypertension: Secondary | ICD-10-CM

## 2021-10-24 ENCOUNTER — Ambulatory Visit: Payer: PPO | Admitting: Family Medicine

## 2021-10-25 ENCOUNTER — Other Ambulatory Visit: Payer: Self-pay

## 2021-10-25 DIAGNOSIS — I5023 Acute on chronic systolic (congestive) heart failure: Secondary | ICD-10-CM

## 2021-10-25 MED ORDER — FUROSEMIDE 40 MG PO TABS: 40.0000 mg | ORAL_TABLET | Freq: Every day | ORAL | 2 refills | Status: DC

## 2021-10-27 ENCOUNTER — Other Ambulatory Visit: Payer: Self-pay | Admitting: Family Medicine

## 2021-10-27 ENCOUNTER — Encounter: Payer: Self-pay | Admitting: *Deleted

## 2021-10-27 DIAGNOSIS — N183 Chronic kidney disease, stage 3 unspecified: Secondary | ICD-10-CM | POA: Diagnosis not present

## 2021-10-27 DIAGNOSIS — E119 Type 2 diabetes mellitus without complications: Secondary | ICD-10-CM

## 2021-10-31 DIAGNOSIS — J90 Pleural effusion, not elsewhere classified: Secondary | ICD-10-CM | POA: Diagnosis not present

## 2021-10-31 DIAGNOSIS — N183 Chronic kidney disease, stage 3 unspecified: Secondary | ICD-10-CM | POA: Diagnosis not present

## 2021-10-31 DIAGNOSIS — N189 Chronic kidney disease, unspecified: Secondary | ICD-10-CM | POA: Diagnosis not present

## 2021-10-31 DIAGNOSIS — R188 Other ascites: Secondary | ICD-10-CM | POA: Diagnosis not present

## 2021-11-01 ENCOUNTER — Telehealth: Payer: Self-pay | Admitting: Family Medicine

## 2021-11-01 NOTE — Telephone Encounter (Signed)
Christina called from [employer] stating that patient needs an updated application on file. Says that they have already received pts proof of income so they do not need that part but do need patient and PCP to fill out new application.   (New application for patient has been placed in Julies box)

## 2021-11-08 ENCOUNTER — Ambulatory Visit (INDEPENDENT_AMBULATORY_CARE_PROVIDER_SITE_OTHER): Payer: PPO | Admitting: Family Medicine

## 2021-11-08 ENCOUNTER — Encounter: Payer: Self-pay | Admitting: Family Medicine

## 2021-11-08 VITALS — BP 113/72 | HR 72 | Temp 96.6°F | Ht 71.0 in | Wt 225.0 lb

## 2021-11-08 DIAGNOSIS — Z794 Long term (current) use of insulin: Secondary | ICD-10-CM | POA: Diagnosis not present

## 2021-11-08 DIAGNOSIS — Z95810 Presence of automatic (implantable) cardiac defibrillator: Secondary | ICD-10-CM

## 2021-11-08 DIAGNOSIS — Z7901 Long term (current) use of anticoagulants: Secondary | ICD-10-CM | POA: Diagnosis not present

## 2021-11-08 DIAGNOSIS — E119 Type 2 diabetes mellitus without complications: Secondary | ICD-10-CM

## 2021-11-08 DIAGNOSIS — E875 Hyperkalemia: Secondary | ICD-10-CM | POA: Diagnosis not present

## 2021-11-08 LAB — POCT INR: INR: 7 — AB (ref 2.0–3.0)

## 2021-11-08 LAB — COAGUCHEK XS/INR WAIVED
INR: 6.9 (ref 0.9–1.1)
Prothrombin Time: 82.6 s

## 2021-11-08 MED ORDER — JANUVIA 50 MG PO TABS: 50.0000 mg | ORAL_TABLET | Freq: Every day | ORAL | 2 refills | Status: DC

## 2021-11-08 NOTE — Progress Notes (Signed)
Subjective:  Patient ID: John [name], male    DOB: [birth year]  Age: 71 y.o. MRN: [id number]  CC: Medical Management of Chronic Issues   HPI John [name] presents for Atrial fibrillation follow up. Pt. is treated with rate control and anticoagulation. Pt.  denies palpitations, rapid rate, chest pain, dyspnea and edema. There has been no bleeding from nose or gums. Pt. has not noticed blood with urine or stool.  Although there is routine bruising easily, it is not excessive.    Depression screen [facility] 2/9 11/08/2021 11/08/2021 08/25/2021  Decreased Interest 0 0 0  Down, Depressed, Hopeless 0 0 0  PHQ - 2 Score 0 0 0  Altered sleeping 1 - -  Tired, decreased energy 1 - -  Change in appetite 0 - -  Feeling bad or failure about yourself  0 - -  Trouble concentrating 0 - -  Moving slowly or fidgety/restless 0 - -  Suicidal thoughts - - -  PHQ-9 Score 2 - -  Difficult doing work/chores Not difficult at all - -  Some recent data might be hidden    History [place] has a past medical history of Abnormal nuclear stress test (05/10/2017), Allergy, Anemia, Arthritis, Atrial fibrillation ([facility]), Bruises easily, CAD (coronary artery disease), CHF (congestive heart failure) ([place]), Constipation, 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   He has a past surgical history that includes Mitral valve replacement (11/2000); laparotomy (N/A, 02/09/2013); Portacath placement (N/A, 02/09/2013); Coronary artery bypass graft (2002);  Septoplasty; Application if wound vac (2014); Colonoscopy; Esophagogastroduodenoscopy; Laparoscopic incisional / umbilical / ventral hernia repair (03/15/2014); Port-a-cath removal (03/15/2014); Hernia repair; Cardiac catheterization (2002); Incisional hernia repair (N/A, 03/15/2014); Port-a-cath removal (Left, 03/15/2014); Insertion of mesh (N/A, 03/15/2014); RIGHT/LEFT HEART CATH AND CORONARY/GRAFT ANGIOGRAPHY (N/A, 05/10/2017); Coronary artery bypass graft; Small intestine surgery; ICD IMPLANT (N/A, 02/19/2020); RIGHT/LEFT HEART CATH AND CORONARY/GRAFT ANGIOGRAPHY (N/A, 06/23/2020); CORONARY STENT INTERVENTION (N/A, 06/24/2020); CORONARY ATHERECTOMY (N/A, 06/24/2020); and CORONARY BALLOON ANGIOPLASTY (N/A, 06/24/2020).   His family history includes Arthritis in his brother; Depression in his brother; Diabetes in his brother, brother, and mother; Heart disease in his brother and brother; Hyperlipidemia in his brother and brother; Hypertension in his brother, brother, and father; Thyroid disease in his sister.He reports that he has quit smoking. His smoking use included cigars. He has never used smokeless tobacco. He reports that he does not drink alcohol and does not use drugs.    ROS Review of Systems  Constitutional:  Negative for fever.  Respiratory:  Negative for shortness of breath.   Cardiovascular:  Negative for chest pain.  Musculoskeletal:  Negative for arthralgias.  Skin:  Negative for rash.   Objective:  BP 113/72    Pulse 72    Temp (!) 96.6 F (35.9 C)    Ht 5' 11" (1.803 m)    Wt 225 lb (102.1 kg)    SpO2 99%    BMI 31.38 kg/m   BP Readings from Last 3 Encounters:  11/08/21 113/72  10/05/21 113/60  09/14/21 114/61    Wt Readings from Last 3 Encounters:  11/08/21 225 lb (102.1 kg)  10/05/21 211  lb 11.2 oz (96 kg)  09/14/21 215 lb 6 oz (97.7 kg)     Physical Exam Vitals reviewed.  Constitutional:      Appearance: He is well-developed.  HENT:     Head: Normocephalic and  atraumatic.     Right Ear: External ear normal.     Left Ear: External ear normal.     Mouth/Throat:     Pharynx: No oropharyngeal exudate or posterior oropharyngeal erythema.  Eyes:     Pupils: Pupils are equal, round, and reactive to light.  Cardiovascular:     Rate and Rhythm: Normal rate and regular rhythm.     Heart sounds: No murmur heard. Pulmonary:     Effort: No respiratory distress.     Breath sounds: Normal breath sounds.  Musculoskeletal:     Cervical back: Normal range of motion and neck supple.  Neurological:     Mental Status: He is alert and oriented to person, place, and time.    Results for orders placed or performed in visit on 11/08/21  CoaguChek XS/INR Waived  Result Value Ref Range   INR 6.9 (HH) 0.9 - 1.1   Prothrombin Time 82.6 sec  POCT INR  Result Value Ref Range   INR 7.0 (A) 2.0 - 3.0     Assessment & Plan:   John [name] was seen today for medical management of chronic issues.  Diagnoses and all orders for this visit:  Long term (current) use of anticoagulants -     CoaguChek XS/INR Waived -     POCT INR -     BMP8+EGFR  Type 2 diabetes mellitus without complication, with long-term current use of insulin (HCC) -     JANUVIA 50 MG tablet; Take 1 tablet (50 mg total) by mouth daily. -     BMP8+EGFR  ICD (implantable cardioverter-defibrillator) in place -     BMP8+EGFR  Hyperkalemia -     BMP8+EGFR   Hold coumadin today and tomorrow. Then resume at 2.5 mg daily    I have changed John [name]'s Januvia. I am also having him maintain his nitroGLYCERIN, insulin glargine, Entresto, canagliflozin, sodium bicarbonate, OneTouch Delica Lancets 03K, rosuvastatin, warfarin, clopidogrel, glipiZIDE, isosorbide mononitrate, pantoprazole, colchicine, metFORMIN, Febuxostat, metoprolol, furosemide, OneTouch Verio, and Lokelma.  Allergies as of 11/08/2021       Reactions   Allopurinol Shortness Of Breath   Doxycycline Hives   Farxiga  [dapagliflozin]    Can not tolerate         Medication List        Accurate as of November 08, 2021  7:21 PM. If you have any questions, ask your nurse or doctor.          canagliflozin 300 MG Tabs tablet Commonly known as: INVOKANA Take 300 mg by mouth every other day.   clopidogrel 75 MG tablet Commonly known as: PLAVIX TAKE 1 TABLET BY MOUTH DAILY WITH BREAKFAST.   colchicine 0.6 MG tablet Take twice daily for gout attack. (may take every two hours up to 6 doses at acute onset)   Entresto 24-26 MG Generic drug: sacubitril-valsartan Take 1 tablet by mouth 2 (two) times daily.   Febuxostat 80 MG Tabs Commonly known as: Uloric Take 1 tablet (80 mg total) by mouth daily.   furosemide 40 MG tablet Commonly known as: LASIX Take 1 tablet (40 mg total) by mouth daily.   glipiZIDE 10 MG 24 hr tablet Commonly known as: GLUCOTROL XL Take 1 tablet (10 mg total)  by mouth daily.   insulin glargine 100 UNIT/ML injection Commonly known as: LANTUS Inject 0.7 mLs (70 Units total) into the skin at bedtime. What changed:  when to take this additional instructions   isosorbide mononitrate 30 MG 24 hr tablet Commonly known as: IMDUR Take 1 tablet (30 mg total) by mouth daily.   Januvia 50 MG tablet Generic drug: sitaGLIPtin Take 1 tablet (50 mg total) by mouth daily. What changed: how much to take Changed by: Claretta Fraise, MD   Lokelma 10 g Pack packet Generic drug: sodium zirconium cyclosilicate Take 10 g by mouth every other day.   metFORMIN 500 MG tablet Commonly known as: GLUCOPHAGE Take 1 tablet (500 mg total) by mouth daily with breakfast.   metoprolol 200 MG 24 hr tablet Commonly known as: TOPROL-XL TAKE 1 TABLET (200 MG TOTAL) BY MOUTH DAILY AFTER BREAKFAST.   nitroGLYCERIN 0.4 MG SL tablet Commonly known as: NITROSTAT Place 1 tablet (0.4 mg total) under the tongue every 5 (five) minutes as needed for chest pain.   OneTouch Delica Lancets 26J Misc TEST 2  TIMES DAILY AS DIRECTED   OneTouch Verio test strip Generic drug: glucose blood USE TO TEST TWICE A DAY   pantoprazole 40 MG tablet Commonly known as: PROTONIX Take 1 tablet (40 mg total) by mouth daily.   rosuvastatin 20 MG tablet Commonly known as: CRESTOR TAKE 1 TABLET BY MOUTH EVERY DAY   sodium bicarbonate 650 MG tablet Take 650 mg by mouth 2 (two) times daily.   warfarin 5 MG tablet Commonly known as: COUMADIN Take as directed by the anticoagulation clinic. If you are unsure how to take this medication, talk to your nurse or doctor. Original instructions: Take 1 tablet (5 mg total) by mouth daily.         Follow-up: Return in about 5 days (around 11/13/2021) for A fib/ coumadin.  Claretta Fraise, M.D.

## 2021-11-09 LAB — BMP8+EGFR
BUN/Creatinine Ratio: 24 (ref 10–24)
BUN: 61 mg/dL — ABNORMAL HIGH (ref 8–27)
CO2: 21 mmol/L (ref 20–29)
Calcium: 9.4 mg/dL (ref 8.6–10.2)
Chloride: 105 mmol/L (ref 96–106)
Creatinine, Ser: 2.53 mg/dL — ABNORMAL HIGH (ref 0.76–1.27)
Glucose: 151 mg/dL — ABNORMAL HIGH (ref 70–99)
Potassium: 4.8 mmol/L (ref 3.5–5.2)
Sodium: 144 mmol/L (ref 134–144)
eGFR: 27 mL/min/{1.73_m2} — ABNORMAL LOW (ref >59)

## 2021-11-13 ENCOUNTER — Encounter: Payer: Self-pay | Admitting: Family Medicine

## 2021-11-13 ENCOUNTER — Ambulatory Visit (INDEPENDENT_AMBULATORY_CARE_PROVIDER_SITE_OTHER): Payer: PPO | Admitting: Family Medicine

## 2021-11-13 VITALS — BP 103/62 | HR 79 | Temp 97.3°F | Ht 71.0 in | Wt 227.4 lb

## 2021-11-13 DIAGNOSIS — Z9889 Other specified postprocedural states: Secondary | ICD-10-CM | POA: Diagnosis not present

## 2021-11-13 DIAGNOSIS — Z7901 Long term (current) use of anticoagulants: Secondary | ICD-10-CM

## 2021-11-13 LAB — COAGUCHEK XS/INR WAIVED
INR: 4.4 — ABNORMAL HIGH (ref 0.9–1.1)
Prothrombin Time: 52.7 s

## 2021-11-13 LAB — POCT INR: INR: 4.4 — AB (ref 2.0–3.0)

## 2021-11-13 NOTE — Progress Notes (Signed)
? ?Subjective:  ?Patient ID: John [name], male    DOB: Mar 16, 1951  Age: 71 y.o. MRN: [id number] ? ?CC: Anticoagulation ? ? ?HPI ?John [name] presents for Atrial fibrillation follow up. Pt. is treated with rate control and anticoagulation. He also has a mechanical mitral valve replacement. INR Goal is 2.5-3.5 Pt.  denies palpitations, rapid rate, chest pain, dyspnea and edema. There has been no bleeding from nose or gums. Pt. has not noticed blood with urine or stool.  Although there is routine bruising easily, it is not excessive. ? ? ?Depression screen [facility] 2/9 11/13/2021 11/08/2021 11/08/2021  ?Decreased Interest 0 0 0  ?Down, Depressed, Hopeless 0 0 0  ?PHQ - 2 Score 0 0 0  ?Altered sleeping - 1 -  ?Tired, decreased energy - 1 -  ?Change in appetite - 0 -  ?Feeling bad or failure about yourself  - 0 -  ?Trouble concentrating - 0 -  ?Moving slowly or fidgety/restless - 0 -  ?Suicidal thoughts - - -  ?PHQ-9 Score - 2 -  ?Difficult doing work/chores - Not difficult at all -  ?Some recent data might be hidden  ? ? 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  ? ?He has a past surgical history that includes Mitral valve replacement (11/2000); laparotomy (N/A, 02/09/2013); Portacath placement (N/A,  02/09/2013); Coronary artery bypass graft (2002); Septoplasty; Application if wound vac (2014); Colonoscopy; Esophagogastroduodenoscopy; Laparoscopic incisional / umbilical / ventral hernia repair (03/15/2014); Port-a-cath removal (03/15/2014); Hernia repair; Cardiac catheterization (2002); Incisional hernia repair (N/A, 03/15/2014); Port-a-cath removal (Left, 03/15/2014); Insertion of mesh (N/A, 03/15/2014); RIGHT/LEFT HEART CATH AND CORONARY/GRAFT ANGIOGRAPHY (N/A, 05/10/2017); Coronary artery bypass graft; Small intestine surgery; ICD IMPLANT (N/A, 02/19/2020); RIGHT/LEFT HEART CATH AND CORONARY/GRAFT ANGIOGRAPHY (N/A, 06/23/2020); CORONARY STENT INTERVENTION (N/A, 06/24/2020); CORONARY ATHERECTOMY (N/A, 06/24/2020); and CORONARY BALLOON ANGIOPLASTY (N/A, 06/24/2020).  ? ?His family history includes Arthritis in his brother; Depression in his brother; Diabetes in his brother, brother, and mother; Heart disease in his brother and brother; Hyperlipidemia in his brother and brother; Hypertension in his brother, brother, and father; Thyroid disease in his sister.He reports that he has quit smoking. His smoking use included cigars. He has never used smokeless tobacco. He reports that he does not drink alcohol and does not use drugs. ? ? ? ?ROS ?Review of Systems  ?Hematological:  Bruises/bleeds easily.  ? ?Objective:  ?BP 103/62   Pulse 79   Temp (!) 97.3 ?F (36.3 ?C)   Ht '5\' 11"'$  (1.803 m)   Wt 227 lb 6.4 oz (103.1 kg)   SpO2 99%   BMI 31.72 kg/m?  ? ?BP Readings from Last 3 Encounters:  ?11/13/21 103/62  ?11/08/21 113/72  ?10/05/21 113/60  ? ? ?Wt Readings from Last 3 Encounters:  ?11/13/21 227 lb 6.4 oz (103.1 kg)  ?11/08/21 225 lb (102.1 kg)  ?10/05/21 211 lb 11.2 oz (96 kg)  ? ? ? ?Physical Exam ?Vitals reviewed.  ?Constitutional:   ?  Appearance: He is well-developed.  ?HENT:  ?   Head: Normocephalic and atraumatic.  ?   Right Ear: External ear normal.  ?   Left Ear: External ear normal.  ?   Mouth/Throat:  ?   Pharynx:  No oropharyngeal exudate or posterior oropharyngeal erythema.  ?Eyes:  ?   Pupils: Pupils are equal, round, and reactive to light.  ?Cardiovascular:  ?   Rate and Rhythm: Normal rate and regular rhythm.  ?   Heart sounds: No murmur heard. ?Pulmonary:  ?   Effort: No respiratory distress.  ?   Breath sounds: Normal breath sounds.  ?Musculoskeletal:  ?   Cervical back: Normal range of motion and neck supple.  ?Neurological:  ?   Mental Status: He is alert and oriented to person, place, and time.  ? ? ? ? ?Assessment & Plan:  ? ?John [name] was seen today for anticoagulation. ? ?Diagnoses and all orders for this visit: ? ?Long term (current) use of anticoagulants ?-     CoaguChek XS/INR Waived ? ?MITRAL VALVE REPLACEMENT, HX OF ? ?Other orders ?-     POCT INR ? ? ?Description   ? ? ? ? ?  ?  ? ? ? ?I am having John [name] maintain his nitroGLYCERIN, insulin glargine, Entresto, canagliflozin, sodium bicarbonate, OneTouch Delica Lancets 98X, rosuvastatin, warfarin, clopidogrel, glipiZIDE, isosorbide mononitrate, pantoprazole, colchicine, metFORMIN, Febuxostat, metoprolol, furosemide, OneTouch Verio, Januvia, and Lokelma. ? ?Allergies as of 11/13/2021   ? ?   Reactions  ? Allopurinol Shortness Of Breath  ? Doxycycline Hives  ? [place] [dapagliflozin]   ? Can not tolerate   ? ?  ? ?  ?Medication List  ?  ? ?  ? Accurate as of November 13, 2021  2:59 PM. If you have any questions, ask your nurse or doctor.  ?  ?  ? ?  ? ?canagliflozin 300 MG Tabs tablet ?Commonly known as: INVOKANA ?Take 300 mg by mouth every other day. ?  ?clopidogrel 75 MG tablet ?Commonly known as: PLAVIX ?TAKE 1 TABLET BY MOUTH DAILY WITH BREAKFAST. ?  ?colchicine 0.6 MG tablet ?Take twice daily for gout attack. (may take every two hours up to 6 doses at acute onset) ?  ?Entresto 24-26 MG ?Generic drug: sacubitril-valsartan ?Take 1 tablet by mouth 2 (two) times daily. ?  ?Febuxostat 80 MG Tabs ?Commonly known as: Uloric ?Take 1 tablet (80 mg total) by mouth  daily. ?  ?furosemide 40 MG tablet ?Commonly known as: LASIX ?Take 1 tablet (40 mg total) by mouth daily. ?  ?glipiZIDE 10 MG 24 hr tablet ?Commonly known as: GLUCOTROL XL ?Take 1 tablet (10 mg total) by mouth daily. ?  ?insulin glargine 100 UNIT/ML injection ?Commonly known as: LANTUS ?Inject 0.7 mLs (70 Units total) into the skin at bedtime. ?What changed:  ?when to take this ?additional instructions ?  ?isosorbide mononitrate 30 MG 24 hr tablet ?Commonly known as: IMDUR ?Take 1 tablet (30 mg total) by mouth daily. ?  ?Januvia 50 MG tablet ?Generic drug: sitaGLIPtin ?Take 1 tablet (50 mg total) by mouth daily. ?  ?Lokelma 10 g Pack packet ?Generic drug: sodium zirconium cyclosilicate ?Take 10 g by mouth every other day. ?  ?metFORMIN 500 MG tablet ?Commonly known as: GLUCOPHAGE ?Take 1 tablet (500 mg total) by mouth daily with breakfast. ?  ?metoprolol 200 MG 24 hr tablet ?Commonly known as: TOPROL-XL ?TAKE 1 TABLET (200 MG TOTAL) BY MOUTH DAILY AFTER BREAKFAST. ?  ?nitroGLYCERIN 0.4 MG  SL tablet ?Commonly known as: NITROSTAT ?Place 1 tablet (0.4 mg total) under the tongue every 5 (five) minutes as needed for chest pain. ?  ?OneTouch Delica Lancets 32D Misc ?TEST 2 TIMES DAILY AS DIRECTED ?  ?OneTouch Verio test strip ?Generic drug: glucose blood ?USE TO TEST TWICE A DAY ?  ?pantoprazole 40 MG tablet ?Commonly known as: PROTONIX ?Take 1 tablet (40 mg total) by mouth daily. ?  ?rosuvastatin 20 MG tablet ?Commonly known as: CRESTOR ?TAKE 1 TABLET BY MOUTH EVERY DAY ?  ?sodium bicarbonate 650 MG tablet ?Take 650 mg by mouth 2 (two) times daily. ?  ?warfarin 5 MG tablet ?Commonly known as: COUMADIN ?Take as directed by the anticoagulation clinic. If you are unsure how to take this medication, talk to your nurse or doctor. ?Original instructions: Take 1 tablet (5 mg total) by mouth daily. ?  ? ?  ? ? ? ?Follow-up: Return in about 1 week (around 11/20/2021). ? ?Claretta Fraise, M.D. ?

## 2021-11-13 NOTE — Patient Instructions (Signed)
Hold today and Tuesday's dose ?

## 2021-11-17 ENCOUNTER — Telehealth: Payer: Self-pay | Admitting: Cardiology

## 2021-11-17 ENCOUNTER — Ambulatory Visit: Payer: PPO | Admitting: Cardiology

## 2021-11-17 ENCOUNTER — Telehealth: Payer: Self-pay | Admitting: Family Medicine

## 2021-11-17 ENCOUNTER — Encounter: Payer: Self-pay | Admitting: Cardiology

## 2021-11-17 ENCOUNTER — Inpatient Hospital Stay (HOSPITAL_COMMUNITY): Payer: PPO

## 2021-11-17 ENCOUNTER — Other Ambulatory Visit: Payer: Self-pay

## 2021-11-17 ENCOUNTER — Inpatient Hospital Stay (HOSPITAL_COMMUNITY)
Admission: AD | Admit: 2021-11-17 | Discharge: 2021-12-02 | DRG: 286 | Disposition: A | Discharge status: Home / Self Care | Payer: PPO | Source: Ambulatory Visit | Attending: Cardiology | Admitting: Cardiology

## 2021-11-17 ENCOUNTER — Inpatient Hospital Stay: Payer: Self-pay

## 2021-11-17 VITALS — BP 100/61 | HR 81 | Ht 71.0 in | Wt 230.2 lb

## 2021-11-17 DIAGNOSIS — E11649 Type 2 diabetes mellitus with hypoglycemia without coma: Secondary | ICD-10-CM | POA: Diagnosis not present

## 2021-11-17 DIAGNOSIS — E1165 Type 2 diabetes mellitus with hyperglycemia: Secondary | ICD-10-CM | POA: Diagnosis present

## 2021-11-17 DIAGNOSIS — I2510 Atherosclerotic heart disease of native coronary artery without angina pectoris: Secondary | ICD-10-CM | POA: Diagnosis not present

## 2021-11-17 DIAGNOSIS — Z79899 Other long term (current) drug therapy: Secondary | ICD-10-CM

## 2021-11-17 DIAGNOSIS — N179 Acute kidney failure, unspecified: Secondary | ICD-10-CM | POA: Diagnosis not present

## 2021-11-17 DIAGNOSIS — I4510 Unspecified right bundle-branch block: Secondary | ICD-10-CM | POA: Diagnosis present

## 2021-11-17 DIAGNOSIS — R040 Epistaxis: Secondary | ICD-10-CM | POA: Diagnosis not present

## 2021-11-17 DIAGNOSIS — I110 Hypertensive heart disease with heart failure: Secondary | ICD-10-CM | POA: Diagnosis not present

## 2021-11-17 DIAGNOSIS — I517 Cardiomegaly: Secondary | ICD-10-CM | POA: Diagnosis not present

## 2021-11-17 DIAGNOSIS — N184 Chronic kidney disease, stage 4 (severe): Secondary | ICD-10-CM | POA: Diagnosis not present

## 2021-11-17 DIAGNOSIS — I5023 Acute on chronic systolic (congestive) heart failure: Secondary | ICD-10-CM | POA: Diagnosis not present

## 2021-11-17 DIAGNOSIS — E119 Type 2 diabetes mellitus without complications: Secondary | ICD-10-CM

## 2021-11-17 DIAGNOSIS — Z881 Allergy status to other antibiotic agents status: Secondary | ICD-10-CM

## 2021-11-17 DIAGNOSIS — I272 Pulmonary hypertension, unspecified: Secondary | ICD-10-CM | POA: Diagnosis present

## 2021-11-17 DIAGNOSIS — Z833 Family history of diabetes mellitus: Secondary | ICD-10-CM

## 2021-11-17 DIAGNOSIS — K219 Gastro-esophageal reflux disease without esophagitis: Secondary | ICD-10-CM | POA: Diagnosis present

## 2021-11-17 DIAGNOSIS — Z20822 Contact with and (suspected) exposure to covid-19: Secondary | ICD-10-CM | POA: Diagnosis present

## 2021-11-17 DIAGNOSIS — E1122 Type 2 diabetes mellitus with diabetic chronic kidney disease: Secondary | ICD-10-CM | POA: Diagnosis present

## 2021-11-17 DIAGNOSIS — I130 Hypertensive heart and chronic kidney disease with heart failure and stage 1 through stage 4 chronic kidney disease, or unspecified chronic kidney disease: Principal | ICD-10-CM | POA: Diagnosis present

## 2021-11-17 DIAGNOSIS — I350 Nonrheumatic aortic (valve) stenosis: Secondary | ICD-10-CM | POA: Diagnosis present

## 2021-11-17 DIAGNOSIS — Z7984 Long term (current) use of oral hypoglycemic drugs: Secondary | ICD-10-CM | POA: Diagnosis not present

## 2021-11-17 DIAGNOSIS — Z9889 Other specified postprocedural states: Secondary | ICD-10-CM

## 2021-11-17 DIAGNOSIS — Z951 Presence of aortocoronary bypass graft: Secondary | ICD-10-CM

## 2021-11-17 DIAGNOSIS — I352 Nonrheumatic aortic (valve) stenosis with insufficiency: Secondary | ICD-10-CM | POA: Diagnosis not present

## 2021-11-17 DIAGNOSIS — Z95828 Presence of other vascular implants and grafts: Secondary | ICD-10-CM

## 2021-11-17 DIAGNOSIS — Z818 Family history of other mental and behavioral disorders: Secondary | ICD-10-CM

## 2021-11-17 DIAGNOSIS — Z8249 Family history of ischemic heart disease and other diseases of the circulatory system: Secondary | ICD-10-CM

## 2021-11-17 DIAGNOSIS — D509 Iron deficiency anemia, unspecified: Secondary | ICD-10-CM | POA: Diagnosis not present

## 2021-11-17 DIAGNOSIS — Z4682 Encounter for fitting and adjustment of non-vascular catheter: Secondary | ICD-10-CM | POA: Diagnosis not present

## 2021-11-17 DIAGNOSIS — R0602 Shortness of breath: Secondary | ICD-10-CM | POA: Diagnosis not present

## 2021-11-17 DIAGNOSIS — I5082 Biventricular heart failure: Secondary | ICD-10-CM | POA: Diagnosis present

## 2021-11-17 DIAGNOSIS — Z888 Allergy status to other drugs, medicaments and biological substances status: Secondary | ICD-10-CM

## 2021-11-17 DIAGNOSIS — Z83438 Family history of other disorder of lipoprotein metabolism and other lipidemia: Secondary | ICD-10-CM

## 2021-11-17 DIAGNOSIS — I080 Rheumatic disorders of both mitral and aortic valves: Secondary | ICD-10-CM | POA: Diagnosis not present

## 2021-11-17 DIAGNOSIS — Z86010 Personal history of colonic polyps: Secondary | ICD-10-CM

## 2021-11-17 DIAGNOSIS — I482 Chronic atrial fibrillation, unspecified: Secondary | ICD-10-CM

## 2021-11-17 DIAGNOSIS — E785 Hyperlipidemia, unspecified: Secondary | ICD-10-CM | POA: Diagnosis not present

## 2021-11-17 DIAGNOSIS — Z87442 Personal history of urinary calculi: Secondary | ICD-10-CM

## 2021-11-17 DIAGNOSIS — Z8349 Family history of other endocrine, nutritional and metabolic diseases: Secondary | ICD-10-CM

## 2021-11-17 DIAGNOSIS — I4821 Permanent atrial fibrillation: Secondary | ICD-10-CM | POA: Diagnosis present

## 2021-11-17 DIAGNOSIS — Z8261 Family history of arthritis: Secondary | ICD-10-CM

## 2021-11-17 DIAGNOSIS — R7401 Elevation of levels of liver transaminase levels: Secondary | ICD-10-CM | POA: Diagnosis present

## 2021-11-17 DIAGNOSIS — J9811 Atelectasis: Secondary | ICD-10-CM | POA: Diagnosis not present

## 2021-11-17 DIAGNOSIS — I10 Essential (primary) hypertension: Secondary | ICD-10-CM | POA: Diagnosis present

## 2021-11-17 DIAGNOSIS — Z7901 Long term (current) use of anticoagulants: Secondary | ICD-10-CM

## 2021-11-17 DIAGNOSIS — N183 Chronic kidney disease, stage 3 unspecified: Secondary | ICD-10-CM | POA: Diagnosis present

## 2021-11-17 DIAGNOSIS — M109 Gout, unspecified: Secondary | ICD-10-CM | POA: Diagnosis present

## 2021-11-17 DIAGNOSIS — Z952 Presence of prosthetic heart valve: Secondary | ICD-10-CM

## 2021-11-17 DIAGNOSIS — Z955 Presence of coronary angioplasty implant and graft: Secondary | ICD-10-CM

## 2021-11-17 DIAGNOSIS — Z794 Long term (current) use of insulin: Secondary | ICD-10-CM

## 2021-11-17 DIAGNOSIS — I255 Ischemic cardiomyopathy: Secondary | ICD-10-CM | POA: Diagnosis present

## 2021-11-17 DIAGNOSIS — E1142 Type 2 diabetes mellitus with diabetic polyneuropathy: Secondary | ICD-10-CM | POA: Diagnosis not present

## 2021-11-17 DIAGNOSIS — I509 Heart failure, unspecified: Secondary | ICD-10-CM | POA: Diagnosis not present

## 2021-11-17 DIAGNOSIS — M1990 Unspecified osteoarthritis, unspecified site: Secondary | ICD-10-CM | POA: Diagnosis present

## 2021-11-17 DIAGNOSIS — I351 Nonrheumatic aortic (valve) insufficiency: Secondary | ICD-10-CM | POA: Diagnosis present

## 2021-11-17 DIAGNOSIS — Z87891 Personal history of nicotine dependence: Secondary | ICD-10-CM

## 2021-11-17 LAB — CBC WITH DIFFERENTIAL/PLATELET
Abs Immature Granulocytes: 0.01 10*3/uL (ref 0.00–0.07)
Basophils Absolute: 0 10*3/uL (ref 0.0–0.1)
Basophils Relative: 1 %
Eosinophils Absolute: 0.1 10*3/uL (ref 0.0–0.5)
Eosinophils Relative: 2 %
HCT: 41.4 % (ref 39.0–52.0)
Hemoglobin: 13.1 g/dL (ref 13.0–17.0)
Immature Granulocytes: 0 %
Lymphocytes Relative: 11 %
Lymphs Abs: 0.6 10*3/uL — ABNORMAL LOW (ref 0.7–4.0)
MCH: 25.7 pg — ABNORMAL LOW (ref 26.0–34.0)
MCHC: 31.6 g/dL (ref 30.0–36.0)
MCV: 81.2 fL (ref 80.0–100.0)
Monocytes Absolute: 0.7 10*3/uL (ref 0.1–1.0)
Monocytes Relative: 14 %
Neutro Abs: 3.7 10*3/uL (ref 1.7–7.7)
Neutrophils Relative %: 72 %
Platelets: 158 10*3/uL (ref 150–400)
RBC: 5.1 MIL/uL (ref 4.22–5.81)
RDW: 21.2 % — ABNORMAL HIGH (ref 11.5–15.5)
WBC: 5.1 10*3/uL (ref 4.0–10.5)
nRBC: 0 % (ref 0.0–0.2)

## 2021-11-17 LAB — BRAIN NATRIURETIC PEPTIDE: B Natriuretic Peptide: >4500 pg/mL — ABNORMAL HIGH (ref 0.0–100.0)

## 2021-11-17 LAB — COMPREHENSIVE METABOLIC PANEL
ALT: 52 U/L — ABNORMAL HIGH (ref 0–44)
AST: 56 U/L — ABNORMAL HIGH (ref 15–41)
Albumin: 3.1 g/dL — ABNORMAL LOW (ref 3.5–5.0)
Alkaline Phosphatase: 143 U/L — ABNORMAL HIGH (ref 38–126)
Anion gap: 13 (ref 5–15)
BUN: 81 mg/dL — ABNORMAL HIGH (ref 8–23)
CO2: 22 mmol/L (ref 22–32)
Calcium: 8.9 mg/dL (ref 8.9–10.3)
Chloride: 105 mmol/L (ref 98–111)
Creatinine, Ser: 3.46 mg/dL — ABNORMAL HIGH (ref 0.61–1.24)
GFR, Estimated: 18 mL/min — ABNORMAL LOW (ref >60)
Glucose, Bld: 188 mg/dL — ABNORMAL HIGH (ref 70–99)
Potassium: 4.4 mmol/L (ref 3.5–5.1)
Sodium: 140 mmol/L (ref 135–145)
Total Bilirubin: 1.5 mg/dL — ABNORMAL HIGH (ref 0.3–1.2)
Total Protein: 6 g/dL — ABNORMAL LOW (ref 6.5–8.1)

## 2021-11-17 LAB — ECHOCARDIOGRAM COMPLETE
AR max vel: 1.24 cm2
AV Area VTI: 1.41 cm2
AV Area mean vel: 1.28 cm2
AV Mean grad: 15 mmHg
AV Peak grad: 18 mmHg
Ao pk vel: 2.12 m/s
Height: 71 in
MV VTI: 1.38 cm2
P 1/2 time: 257 msec
S' Lateral: 6.2 cm
Weight: 3632 oz

## 2021-11-17 LAB — RESP PANEL BY RT-PCR (FLU A&B, COVID) ARPGX2
Influenza A by PCR: NEGATIVE
Influenza B by PCR: NEGATIVE
SARS Coronavirus 2 by RT PCR: NEGATIVE

## 2021-11-17 LAB — GLUCOSE, CAPILLARY
Glucose-Capillary: 178 mg/dL — ABNORMAL HIGH (ref 70–99)
Glucose-Capillary: 220 mg/dL — ABNORMAL HIGH (ref 70–99)

## 2021-11-17 LAB — TROPONIN I (HIGH SENSITIVITY): Troponin I (High Sensitivity): 53 ng/L — ABNORMAL HIGH (ref <18)

## 2021-11-17 LAB — HIV ANTIBODY (ROUTINE TESTING W REFLEX): HIV Screen 4th Generation wRfx: NONREACTIVE

## 2021-11-17 LAB — COOXEMETRY PANEL
Carboxyhemoglobin: 0.7 % (ref 0.5–1.5)
Methemoglobin: <0.7 % (ref 0.0–1.5)
O2 Saturation: 54.3 %
Total hemoglobin: 12.8 g/dL (ref 12.0–16.0)

## 2021-11-17 LAB — TSH: TSH: 5.812 u[IU]/mL — ABNORMAL HIGH (ref 0.350–4.500)

## 2021-11-17 LAB — PROTIME-INR
INR: 2.8 — ABNORMAL HIGH (ref 0.8–1.2)
Prothrombin Time: 29.1 seconds — ABNORMAL HIGH (ref 11.4–15.2)

## 2021-11-17 LAB — MAGNESIUM: Magnesium: 1.8 mg/dL (ref 1.7–2.4)

## 2021-11-17 MED ORDER — SODIUM CHLORIDE 0.9 % IV SOLN: 250.0000 mL | INTRAVENOUS | Status: DC | PRN

## 2021-11-17 MED ORDER — CLOPIDOGREL BISULFATE 75 MG PO TABS
75.0000 mg | ORAL_TABLET | Freq: Every day | ORAL | Status: DC
  Administered 2021-11-18 – 2021-12-02 (×15): 75 mg via ORAL
  Filled 2021-11-17 (×16): qty 1

## 2021-11-17 MED ORDER — PANTOPRAZOLE SODIUM 40 MG PO TBEC
40.0000 mg | DELAYED_RELEASE_TABLET | Freq: Every day | ORAL | Status: DC
  Administered 2021-11-18 – 2021-12-02 (×15): 40 mg via ORAL
  Filled 2021-11-17 (×15): qty 1

## 2021-11-17 MED ORDER — WARFARIN SODIUM 2.5 MG PO TABS
2.5000 mg | ORAL_TABLET | Freq: Once | ORAL | Status: AC
  Administered 2021-11-17: 2.5 mg via ORAL
  Filled 2021-11-17: qty 1

## 2021-11-17 MED ORDER — SODIUM CHLORIDE 0.9% FLUSH
3.0000 mL | INTRAVENOUS | Status: DC | PRN
  Administered 2021-11-21: 3 mL via INTRAVENOUS

## 2021-11-17 MED ORDER — ONDANSETRON HCL 4 MG/2ML IJ SOLN: 4.0000 mg | Freq: Four times a day (QID) | INTRAMUSCULAR | Status: DC | PRN

## 2021-11-17 MED ORDER — METOPROLOL SUCCINATE ER 50 MG PO TB24
50.0000 mg | ORAL_TABLET | Freq: Every day | ORAL | Status: DC
  Administered 2021-11-18 – 2021-11-26 (×9): 50 mg via ORAL
  Filled 2021-11-17 (×9): qty 1

## 2021-11-17 MED ORDER — FUROSEMIDE 10 MG/ML IJ SOLN
80.0000 mg | Freq: Two times a day (BID) | INTRAMUSCULAR | Status: DC
  Administered 2021-11-17 – 2021-11-20 (×5): 80 mg via INTRAVENOUS
  Filled 2021-11-17 (×6): qty 8

## 2021-11-17 MED ORDER — SODIUM CHLORIDE 0.9% FLUSH
10.0000 mL | Freq: Two times a day (BID) | INTRAVENOUS | Status: DC
  Administered 2021-11-17 – 2021-12-02 (×18): 10 mL

## 2021-11-17 MED ORDER — METOPROLOL SUCCINATE ER 100 MG PO TB24: 100.0000 mg | ORAL_TABLET | Freq: Every day | ORAL | Status: DC

## 2021-11-17 MED ORDER — INSULIN ASPART 100 UNIT/ML IJ SOLN
0.0000 [IU] | Freq: Three times a day (TID) | INTRAMUSCULAR | Status: DC
  Administered 2021-11-17: 3 [IU] via SUBCUTANEOUS
  Administered 2021-11-18: 8 [IU] via SUBCUTANEOUS
  Administered 2021-11-18: 3 [IU] via SUBCUTANEOUS
  Administered 2021-11-18: 5 [IU] via SUBCUTANEOUS
  Administered 2021-11-19: 8 [IU] via SUBCUTANEOUS
  Administered 2021-11-19: 5 [IU] via SUBCUTANEOUS
  Administered 2021-11-19: 8 [IU] via SUBCUTANEOUS
  Administered 2021-11-20: 3 [IU] via SUBCUTANEOUS
  Administered 2021-11-20: 8 [IU] via SUBCUTANEOUS
  Administered 2021-11-21 (×2): 3 [IU] via SUBCUTANEOUS
  Administered 2021-11-21: 8 [IU] via SUBCUTANEOUS
  Administered 2021-11-22: 5 [IU] via SUBCUTANEOUS
  Administered 2021-11-22: 2 [IU] via SUBCUTANEOUS
  Administered 2021-11-23 (×2): 3 [IU] via SUBCUTANEOUS
  Administered 2021-11-24: 5 [IU] via SUBCUTANEOUS
  Administered 2021-11-25 – 2021-11-26 (×2): 3 [IU] via SUBCUTANEOUS
  Administered 2021-11-28: 8 [IU] via SUBCUTANEOUS
  Administered 2021-11-28: 11 [IU] via SUBCUTANEOUS
  Administered 2021-11-29 (×2): 5 [IU] via SUBCUTANEOUS
  Administered 2021-11-29: 11 [IU] via SUBCUTANEOUS
  Administered 2021-11-30: 5 [IU] via SUBCUTANEOUS
  Administered 2021-11-30: 3 [IU] via SUBCUTANEOUS
  Administered 2021-11-30: 5 [IU] via SUBCUTANEOUS
  Administered 2021-12-01: 3 [IU] via SUBCUTANEOUS
  Administered 2021-12-01 (×2): 5 [IU] via SUBCUTANEOUS
  Administered 2021-12-02: 2 [IU] via SUBCUTANEOUS

## 2021-11-17 MED ORDER — SODIUM CHLORIDE 0.9% FLUSH
3.0000 mL | Freq: Two times a day (BID) | INTRAVENOUS | Status: DC
  Administered 2021-11-17 – 2021-12-02 (×15): 3 mL via INTRAVENOUS

## 2021-11-17 MED ORDER — CHLORHEXIDINE GLUCONATE CLOTH 2 % EX PADS
6.0000 | MEDICATED_PAD | Freq: Every day | CUTANEOUS | Status: DC
  Administered 2021-11-18 – 2021-12-01 (×14): 6 via TOPICAL

## 2021-11-17 MED ORDER — FUROSEMIDE 10 MG/ML IJ SOLN
80.0000 mg | INTRAMUSCULAR | Status: AC
  Administered 2021-11-17: 80 mg via INTRAVENOUS
  Filled 2021-11-17: qty 8

## 2021-11-17 MED ORDER — ACETAMINOPHEN 325 MG PO TABS
650.0000 mg | ORAL_TABLET | ORAL | Status: DC | PRN
  Administered 2021-11-22 – 2021-11-26 (×5): 650 mg via ORAL
  Filled 2021-11-17 (×5): qty 2

## 2021-11-17 MED ORDER — MILRINONE LACTATE IN DEXTROSE 20-5 MG/100ML-% IV SOLN
0.1250 ug/kg/min | INTRAVENOUS | Status: DC
  Administered 2021-11-17 – 2021-11-21 (×8): 0.25 ug/kg/min via INTRAVENOUS
  Administered 2021-11-22: 0.125 ug/kg/min via INTRAVENOUS
  Administered 2021-11-22: 0.25 ug/kg/min via INTRAVENOUS
  Filled 2021-11-17 (×12): qty 100

## 2021-11-17 MED ORDER — WARFARIN - PHARMACIST DOSING INPATIENT: Freq: Every day | Status: DC

## 2021-11-17 MED ORDER — PERFLUTREN LIPID MICROSPHERE
1.0000 mL | INTRAVENOUS | Status: AC | PRN
  Administered 2021-11-17: 2 mL via INTRAVENOUS
  Filled 2021-11-17: qty 10

## 2021-11-17 MED ORDER — ISOSORBIDE MONONITRATE ER 30 MG PO TB24
30.0000 mg | ORAL_TABLET | Freq: Every day | ORAL | Status: DC
  Administered 2021-11-17: 30 mg via ORAL
  Filled 2021-11-17: qty 1

## 2021-11-17 MED ORDER — SODIUM CHLORIDE 0.9% FLUSH
10.0000 mL | INTRAVENOUS | Status: DC | PRN
  Administered 2021-12-02: 10 mL

## 2021-11-17 MED ORDER — ROSUVASTATIN CALCIUM 20 MG PO TABS
20.0000 mg | ORAL_TABLET | Freq: Every day | ORAL | Status: DC
  Administered 2021-11-17 – 2021-12-02 (×16): 20 mg via ORAL
  Filled 2021-11-17 (×17): qty 1

## 2021-11-17 MED ORDER — INSULIN ASPART 100 UNIT/ML IJ SOLN
0.0000 [IU] | Freq: Every day | INTRAMUSCULAR | Status: DC
  Administered 2021-11-17 – 2021-11-20 (×2): 2 [IU] via SUBCUTANEOUS
  Administered 2021-11-21: 3 [IU] via SUBCUTANEOUS
  Administered 2021-11-23: 2 [IU] via SUBCUTANEOUS
  Administered 2021-11-28: 3 [IU] via SUBCUTANEOUS
  Administered 2021-11-29 – 2021-11-30 (×2): 4 [IU] via SUBCUTANEOUS
  Administered 2021-12-01: 3 [IU] via SUBCUTANEOUS

## 2021-11-17 MED ORDER — FUROSEMIDE 10 MG/ML IJ SOLN
80.0000 mg | Freq: Two times a day (BID) | INTRAMUSCULAR | Status: DC
  Administered 2021-11-18: 80 mg via INTRAVENOUS

## 2021-11-17 NOTE — Telephone Encounter (Signed)
Pt c/o Shortness Of Breath: STAT if SOB developed within the last 24 hours or pt is noticeably SOB on the phone ? ?1. Are you currently SOB (can you hear that pt is SOB on the phone)?  ?Patient's wife is not currently with the patient ? ?2. How long have you been experiencing SOB?  ?3 weeks, per patient's wife  ? ?3. Are you SOB when sitting or when up moving around?  ?Both  ? ?4. Are you currently experiencing any other symptoms?  ?Feet swelling  ? ?

## 2021-11-17 NOTE — Patient Instructions (Addendum)
Medication Instructions:  ?BASED ON YOUR DISCHARGE FROM [facility] ? ?Labwork: ?NONE ? ?Testing/Procedures: ?NONE ? ?Follow-Up: ?WILL BE DETERMINED WHEN YOU [place]  ? ?Any Other Special Instructions Will Be Listed Below (If Applicable). ? ?YOU CAN GO DIRECTLY TO [facility] ADMITTING  ?  ?

## 2021-11-17 NOTE — Progress Notes (Signed)
?  Echocardiogram ?2D Echocardiogram has been performed. ? ?John [name] ?11/17/2021, 3:53 PM ?

## 2021-11-17 NOTE — Progress Notes (Signed)
Peripherally Inserted Central Catheter Placement ? ?The IV Nurse has discussed with the patient and/or persons authorized to consent for the patient, the purpose of this procedure and the potential benefits and risks involved with this procedure.  The benefits include less needle sticks, lab draws from the catheter, and the patient may be discharged home with the catheter. Risks include, but not limited to, infection, bleeding, blood clot (thrombus formation), and puncture of an artery; nerve damage and irregular heartbeat and possibility to perform a PICC exchange if needed/ordered by physician.  Alternatives to this procedure were also discussed.  B[facility] Power PICC patient education guide, fact sheet on infection prevention and patient information card has been provided to patient /or left at bedside.   ? ?PICC Placement Documentation  ?PICC Double Lumen 84/13/24 Left Basilic 45 cm 0 cm (Active)  ?Indication for Insertion or Continuance of Line Vasoactive infusions 11/17/21 1540  ?Exposed Catheter (cm) 0 cm 11/17/21 1540  ?Site Assessment Clean, Dry, Intact 11/17/21 1540  ?Lumen #1 Status Flushed;Blood return noted 11/17/21 1540  ?Lumen #2 Status Flushed;Blood return noted 11/17/21 1540  ?Dressing Type Securing device;Transparent 11/17/21 1540  ?Dressing Status Antimicrobial disc in place;Clean, Dry, Intact 11/17/21 1540  ?Dressing Change Due 11/24/21 11/17/21 1540  ? ? ? ? ? ?John [name] ?11/17/2021, 4:41 PM ? ?

## 2021-11-17 NOTE — Progress Notes (Signed)
Cardiology Office Note   Date:  11/17/2021   ID:  John [name], DOB [birth year], MRN [id number]  PCP:  Claretta Fraise, MD  Cardiologist:   Minus Breeding, MD   Chief Complaint  Patient presents with   Shortness of Breath       History of Present Illness: John [name] is a 71 y.o. male who presents for follow of MVR and atrial fib. I sent him for a Lexiscan Myoview in 2018.  He had a reduced EF with a new anterior wall perfusion defect that was not present on the previous perfusion study.  He underwent cardiac cath.  He had an occluded SVG to the RCA but flow through the native vessel.  The LAD was occluded but with good flow through the LIMA.  The patient was admitted for unstable angina in October 2021.  Cardiac catheterization performed on 06/23/2020 revealed 85% ostial left circumflex lesion, 100% mid LAD occlusion, 30% proximal RCA lesion, 80% proximal to distal RCA lesion, 100% occlusion in SVG to distal RCA, patent LIMA to LAD, 70% proximal to mid LAD lesion.  The left circumflex lesion was treated with a Resolute Onyx 2.75 x 26 mm DES on the following day with staged PCI.  Postprocedure, he was started on aspirin and Plavix.  Imdur was discontinued due to hypotension.  He was also started on Entresto.  His Imdur has been held and restarted with caution secondary to hypotension.  [name] has been held and then restarted at a lower dose secondary to CKD.  His last echocardiogram in April 2021 demonstrated the EF to be 25 to 30%.  He had mechanical mitral valve that appears to be normally functioning.  There was mild aortic stenosis.  He called today  His wife reported on the phone that for 3 weeks he been having gout worsening kidney function.  Creatinine 9 days ago was 2.53.  His INR has been elevated.  Looking at the labs it was 7.0 9 days ago.  It is down to 4.4 now.Marland Kitchen  He was having shortness of breath.  He saw Dr. Livia Snellen on the sixth.  I reviewed these notes.  His torsemide  actually been reduced after that appt. he had been taking 40 mg daily and has not taken 20  He says that he has been getting weak for about 2 months.  He cannot walk to the mailbox now without being tired.  He was doing pretty well when I saw him in November.  His abdomen and feet have been swollen.  He has gained probably 15 to 20 pounds.  He has bendopathy, PND and orthopnea.  He has had no new palpitations, presyncope or syncope.  He has had no new chest pressure, neck or arm discomfort.   Past Medical History:  Diagnosis Date   Abnormal nuclear stress test 05/10/2017   Allergy    Anemia    "lost blood w/the cancer"   Arthritis    back    Atrial fibrillation (HCC)    takes Coumadin daily   Bruises easily    takes Coumadin daily   CAD (coronary artery disease)    a. s/p CABG 11/2000 (L-LAD, S-RCA);  b. Lexiscan Myoview 11/12: EF 45%, ischemia and possibly some scar at the base of the inferolateral wall;   c. 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 s/p mechanical (St. Jude) MVR 2002;  b. Echo 5/11: Mild LVH, EF 45-50%, mild AS, mild AI, mean gradient 11 mmHg, MVR with normal gradients, moderate LAE, mild RAE;  c.  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   SOB (shortness of breath)    with exertion   Type II diabetes mellitus ([place])    takes Januvia,Glipizide,and Metformin daily as well as Lantus   Unstable angina ([place]) 06/21/2020    Past Surgical History:  Procedure Laterality Date   APPLICATION OF WOUND VAC  2014   CARDIAC CATHETERIZATION  2002   COLONOSCOPY     CORONARY ARTERY BYPASS GRAFT  2002   x 3   CORONARY ARTERY BYPASS GRAFT     CORONARY ATHERECTOMY N/A 06/24/2020   Procedure: CORONARY ATHERECTOMY;  Surgeon: [name], MD;  Location: [facility];  Service: Cardiovascular;  Laterality: N/A;   CORONARY BALLOON ANGIOPLASTY N/A 06/24/2020   Procedure: CORONARY BALLOON ANGIOPLASTY;  Surgeon: [name], MD;  Location: [facility];  Service: Cardiovascular;  Laterality: N/A;   CORONARY STENT INTERVENTION N/A 06/24/2020   Procedure: CORONARY STENT INTERVENTION;  Surgeon: [name], MD;  Location: [facility];  Service: Cardiovascular;  Laterality: N/A;   ESOPHAGOGASTRODUODENOSCOPY     HERNIA REPAIR     ICD IMPLANT N/A 02/19/2020   Procedure: ICD IMPLANT;  Surgeon: Evans Lance, MD;  Location: [facility];  Service: Cardiovascular;  Laterality: N/A;   INCISIONAL HERNIA REPAIR N/A 03/15/2014   Procedure: LAPAROSCOPIC INCISIONAL HERNIA;  Surgeon: Harl Bowie, MD;  Location: [facility];  Service: General;  Laterality: N/A;   INSERTION OF MESH N/A 03/15/2014   Procedure: INSERTION OF MESH;  Surgeon: Harl Bowie, MD;  Location: [facility];  Service: General;  Laterality: N/A;   LAPAROSCOPIC INCISIONAL / UMBILICAL / [place]  03/15/2014   IHR   LAPAROTOMY N/A 02/09/2013   Procedure: EXPLORATORY LAPAROTOMY WITH incisional biopsy intra- ABDOMINAL MASS ILOCECTOMY ;  Surgeon: Harl Bowie, MD;  Location: [facility];  Service: General;  Laterality:  N/A;   MITRAL VALVE REPLACEMENT  11/2000   #33 St. Jude mechanical mitral valve prosthesis. Dr. Servando Snare   [facility] REMOVAL  03/15/2014   PORT-A-CATH REMOVAL Left 03/15/2014   Procedure: REMOVAL PORT-A-CATH;  Surgeon: Harl Bowie, MD;  Location: [facility];  Service: General;  Laterality: Left;   PORTACATH PLACEMENT N/A 02/09/2013   Procedure: INSERTION PORT-A-CATH;  Surgeon: Harl Bowie, MD;  Location: [facility];  Service: General;  Laterality: N/A;   RIGHT/LEFT HEART CATH AND CORONARY/GRAFT ANGIOGRAPHY N/A 05/10/2017   Procedure: RIGHT/LEFT HEART CATH AND CORONARY/GRAFT ANGIOGRAPHY;  Surgeon: Martinique, Peter M, MD;  Location: [facility];  Service: Cardiovascular;  Laterality: N/A;   RIGHT/LEFT HEART CATH AND CORONARY/GRAFT ANGIOGRAPHY N/A 06/23/2020   Procedure: RIGHT/LEFT HEART CATH AND CORONARY/GRAFT ANGIOGRAPHY;  Surgeon: Troy Sine, MD;  Location: [facility];  Service: Cardiovascular;  Laterality: N/A;   SEPTOPLASTY     SMALL INTESTINE SURGERY       Current Outpatient Medications  Medication Sig Dispense Refill   clopidogrel (PLAVIX) 75 MG tablet TAKE 1 TABLET BY MOUTH DAILY WITH BREAKFAST. 90 tablet 3   colchicine 0.6 MG tablet Take twice daily for gout attack. (may take every two hours up to 6 doses at acute onset) 60 tablet 2   Febuxostat (ULORIC) 80 MG TABS Take 1 tablet (80 mg total) by mouth daily. 90 tablet 1   furosemide (LASIX) 40 MG tablet Take 1 tablet (40 mg total) by mouth daily. 90 tablet 2   glipiZIDE (GLUCOTROL XL) 10 MG 24 hr tablet Take 1 tablet (10 mg total) by mouth daily. 90 tablet 3   insulin glargine (LANTUS) 100 UNIT/ML injection Inject 0.7 mLs (70 Units total) into the skin at bedtime. (Patient taking differently: Inject 70 Units into the skin. Take 70 units if over 180 at bedtime) 10 mL 11   isosorbide mononitrate (IMDUR) 30 MG 24 hr tablet Take 1 tablet (30 mg total) by mouth daily. 90 tablet 3   JANUVIA 50 MG tablet Take 1 tablet (50 mg  total) by mouth daily. 90 tablet 2   metFORMIN (GLUCOPHAGE) 500 MG tablet Take 1 tablet (500 mg total) by mouth daily with breakfast. 90 tablet 3   metoprolol (TOPROL-XL) 200 MG 24 hr tablet TAKE 1 TABLET (200 MG TOTAL) BY MOUTH DAILY AFTER BREAKFAST. 90 tablet 0   nitroGLYCERIN (NITROSTAT) 0.4 MG SL tablet Place 1 tablet (0.4 mg total) under the tongue every 5 (five) minutes as needed for chest pain. 50 tablet 10   OneTouch Delica Lancets 33H MISC TEST 2 TIMES DAILY AS DIRECTED 100 each 5   ONETOUCH VERIO test strip USE TO TEST TWICE A DAY 200 strip 3   pantoprazole (PROTONIX) 40 MG tablet Take 1 tablet (40 mg total) by mouth daily. 90 tablet 3   rosuvastatin (CRESTOR) 20 MG tablet TAKE 1 TABLET BY MOUTH EVERY DAY 90 tablet 3   sacubitril-valsartan (ENTRESTO) 24-26 MG Take 1 tablet by mouth 2 (two) times daily. 180 tablet 3   sodium bicarbonate 650 MG tablet Take 650 mg by mouth 2 (two) times daily.     warfarin (COUMADIN) 5 MG tablet Take 1 tablet (5 mg total) by mouth daily. 90 tablet 3   canagliflozin (INVOKANA) 300 MG TABS tablet Take 300 mg by mouth every other day. (Patient not taking: Reported on 11/17/2021)     sodium zirconium cyclosilicate (LOKELMA) 10 g PACK packet Take 10 g by mouth every other day. (Patient not taking: Reported on 11/17/2021)     No current facility-administered medications for this visit.    Allergies:   Allopurinol, Doxycycline, and Farxiga [dapagliflozin]   Social History   Socioeconomic History   Marital status: Married    Spouse name: Marcie Bal   Number of children: 1   Years of education: 12   Highest education level: High school graduate  Occupational History   Occupation: disabled  Tobacco Use   Smoking status: Former    Types: Cigars   Smokeless tobacco: Never  Vaping Use   Vaping Use: Never used  Substance and Sexual Activity   Alcohol use: No    Comment: "drank for 30 years; quit ~ 2000"   Drug use: No    Types: Marijuana    Comment: quit in  2000 'reefer'   Sexual activity: Yes  Other Topics Concern   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   Social Determinants of Health   Financial Resource Strain: Not on file  Food Insecurity: Not on file  Transportation Needs: Not on file  Physical Activity: Not on file  Stress: Not on file  Social Connections: Not on file  Intimate Partner Violence: Not on file    Family History  Problem Relation Age of Onset   Diabetes Mother    Hypertension Father    Thyroid disease Sister    Diabetes Brother    Arthritis Brother    Depression Brother    Heart disease Brother    Hyperlipidemia Brother    Hypertension Brother    Hypertension Brother    Diabetes Brother    Heart disease Brother    Hyperlipidemia Brother        ROS:  Please see the history of present illness.   Otherwise, review of systems are positive for none.   All other systems are reviewed and negative.    PHYSICAL EXAM: VS:  BP 100/61    Pulse 81    Ht '5\' 11"'$  (1.803 m)    Wt 230 lb 3.2 oz (104.4 kg)    SpO2 99%    BMI 32.11 kg/m  , BMI Body mass index is 32.11 kg/m. GENERAL:  Well appearing HEENT:  Pupils equal round and reactive, fundi not visualized, oral mucosa unremarkable NECK: Positive jugular venous distention to the jaw at 45 degrees, waveform within normal limits, carotid upstroke brisk and symmetric, no bruits, no thyromegaly LYMPHATICS:  No cervical, inguinal adenopathy LUNGS:  Clear to auscultation bilaterally BACK:  No CVA tenderness CHEST:  Unremarkable HEART:  PMI not displaced or sustained,S1 mechanical and S2 within normal limits, no S3, no clicks, no rubs, no murmurs, irregular ABD:  Flat, positive bowel sounds normal in frequency in pitch, no bruits, no rebound, no guarding, no midline pulsatile mass, no hepatomegaly, no splenomegaly, abdominal distention EXT:  2 plus pulses throughout, severe to the thighs bilateral leg edema, positive  cyanosis no clubbing SKIN:  No rashes no nodules NEURO:  Cranial nerves II through XII grossly intact, motor grossly intact throughout PSYCH:  Cognitively intact, oriented to person place and time   EKG:  EKG is  ordered today. Atrial fibrillation, rate 81,  right bundle branch block, no acute ST-T wave changes.   Recent Labs: 09/14/2021: ALT 17; Hemoglobin 13.6; Platelets 171 11/08/2021: BUN 61; Creatinine, Ser 2.53; Potassium 4.8; Sodium 144    Lipid Panel    Component Value Date/Time   CHOL 111 06/09/2021 1140   TRIG 79 06/09/2021 1140   HDL 30 (L) 06/09/2021 1140   CHOLHDL 3.7 06/09/2021 1140   LDLCALC 65 06/09/2021 1140   LDLDIRECT 88 09/23/2017 1419      Wt Readings from Last 3 Encounters:  11/17/21 230 lb 3.2 oz (104.4 kg)  11/13/21 227 lb 6.4 oz (103.1 kg)  11/08/21 225 lb (102.1 kg)      Other studies Reviewed: Additional studies/ records  that were reviewed today include:   Labs Review of the above records demonstrates:  Please see elsewhere in the note.     ASSESSMENT AND PLAN:   CAD: The patient has no acute ischemic symptoms.  We will continue medical management for his coronary disease.  Chronic combined systolic and diastolic heart failure:    I believe he is in low output heart failure.  He needs to be admitted to the hospital under the Advanced HF Service and will likely need advanced monitoring to include Co. ox, CVP with possible inotropic therapy.    Med titration was difficult with his blood pressure is not tolerating significantly higher doses and his renal insufficiency.  His last impedance check I saw him in the office was normal.  Mechanical mitral valve: He can continue his anticoagulation and will need echocardiogram.   He was supratherapeutic on his INR and I suspect hepatic congestion contributing to that.  Liver enzymes will be checked.  History of ICD:     He is up-to-date with follow-up of this.  Chronic atrial fibrillation: This has been  chronic atrial fibrillation rate controlled.  Hypertension:   His blood pressure is low as above.  He has been taking the meds as listed.  At target.  No change in therapy.   Hyperlipidemia:   His LDL was 65 in September.  Continue statin pending liver enzyme evaluation.  DM: A1c was 6.9 in September.  Current medicines are reviewed at length with the patient today.  The patient does not have concerns regarding medicines.  The following changes have been made: None  Labs/ tests ordered today include: The patient will be admitted to the hospital  Orders Placed This Encounter  Procedures   EKG 12-Lead      Disposition:   FU with me in [place] after his hospitalization.    Signed, Minus Breeding, MD  11/17/2021 11:25 AM    [place]

## 2021-11-17 NOTE — Progress Notes (Signed)
?  Transition of Care (TOC) Screening Note ? ? ?Patient Details  ?Name: John [name] ?Date of Birth: June 13, 1951 ? ? ?Transition of Care (TOC) CM/SW Contact:    ?Gari Hartsell, LCSW ?Phone Number: ?11/17/2021, 1:29 PM ? ? ? ?Transition of Care Department [facility]) has reviewed patient and no TOC needs have been identified at this time. We will continue to monitor patient advancement through interdisciplinary progression rounds. Patient will benefit from PT/OT consult for disposition recommendations. If new patient transition needs arise, please place a TOC consult. ?  ?

## 2021-11-17 NOTE — Progress Notes (Signed)
ANTICOAGULATION CONSULT NOTE - Initial Consult ? ?Pharmacy Consult for warfarin ?Indication: Mechanical MVR ? ?Allergies  ?Allergen Reactions  ? Allopurinol Shortness Of Breath  ? Doxycycline Hives  ? Wilder Glade [Dapagliflozin]   ?  Can not tolerate - dizzy weak almost passed out  ? ? ?Patient Measurements: ?Height: '5\' 11"'$  (180.3 cm) ?Weight: 103 kg (227 lb) ?IBW/kg (Calculated) : 75.3 ? ?Vital Signs: ?Temp: 98.1 ?F (36.7 ?C) (03/10 1616) ?Temp Source: Oral (03/10 1616) ?BP: 115/70 (03/10 1616) ?Pulse Rate: 74 (03/10 1616) ? ?Labs: ?Recent Labs  ?  11/17/21 ?1155  ?LABPROT 29.1*  ?INR 2.8*  ? ? ?Estimated Creatinine Clearance: 34.2 mL/min (A) (by C-G formula based on SCr of 2.53 mg/dL (H)). ? ? ?Medical History: ?Past Medical History:  ?Diagnosis Date  ? Abnormal nuclear stress test 05/10/2017  ? Allergy   ? Anemia   ? "lost blood w/the cancer"  ? Arthritis   ? back   ? Atrial fibrillation ([place])   ? takes Coumadin daily  ? Bruises easily   ? takes Coumadin daily  ? CAD (coronary artery disease)   ? a. s/p CABG 11/2000 (L-LAD, S-RCA);  b. Lexiscan Myoview 11/12: EF 45%, ischemia and possibly some scar at the base of the inferolateral wall;   c. Lex MV 11/13:  EF 44%, Inf and IL scar/soft tissue atten, small mild amt of inf ischemia,  ? CHF (congestive heart failure) ([place])   ? Constipation   ? takes Colace daily  ? 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 Enlarged prostate   ? Flu 09/2013  ? GERD (gastroesophageal reflux disease)   ? takes Nexium daily  ? Glaucoma   ? History of blood transfusion   ? "related to cancer"  ? History of colon polyps   ? HLD (hyperlipidemia)   ? takes Vytorin daily  ? HTN (hypertension)   ? takes Amlodipine,Metoprolol, and Ramipril daily  ? Insomnia   ? states he has always been like this.But doesn't take any meds  ? Kidney stones   ? "I passed them all"  ? Nocturia   ? Peripheral edema   ? takes Furosemide daily  ? Peripheral neuropathy   ? Rheumatic fever ~ 1965  ? Rheumatic heart  disease   ? a. s/p mechanical (St. Jude) MVR 2002;  b. Echo 5/11: Mild LVH, EF 45-50%, mild AS, mild AI, mean gradient 11 mmHg, MVR with normal gradients, moderate LAE, mild RAE;  c.  Echo 11/13:   mod LVH, EF 55-60%, mild AS (mean 18 mmHg), mild AI, severe LAE, mild RAE, PASP 41  ? 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 SOB (shortness of breath)   ? with exertion  ? Type II diabetes mellitus ([place])   ? takes Januvia,Glipizide,and Metformin daily as well as Lantus  ? Unstable angina ([place]) 06/21/2020  ? ? ?Medications:  ?Medications Prior to Admission  ?Medication Sig Dispense Refill Last Dose  ? clopidogrel (PLAVIX) 75 MG tablet TAKE 1 TABLET BY MOUTH DAILY WITH BREAKFAST. 90 tablet 3 11/16/2021  ? furosemide (LASIX) 40 MG tablet Take 1 tablet (40 mg total) by mouth daily. 90 tablet 2 11/16/2021  ? glipiZIDE (GLUCOTROL XL) 10 MG 24 hr tablet Take 1 tablet (10 mg total) by mouth daily. 90 tablet 3 11/16/2021  ? insulin glargine (LANTUS) 100 UNIT/ML injection Inject 0.7 mLs (70 Units total) into the skin at bedtime. (Patient taking differently: Inject 70 Units into the  skin. Take 70 units if over 150 daily) 10 mL 11 Past Month  ? isosorbide mononitrate (IMDUR) 30 MG 24 hr tablet Take 1 tablet (30 mg total) by mouth daily. 90 tablet 3 11/16/2021  ? JANUVIA 50 MG tablet Take 1 tablet (50 mg total) by mouth daily. 90 tablet 2 11/16/2021  ? metFORMIN (GLUCOPHAGE) 500 MG tablet Take 1 tablet (500 mg total) by mouth daily with breakfast. 90 tablet 3 11/16/2021  ? metoprolol (TOPROL-XL) 200 MG 24 hr tablet TAKE 1 TABLET (200 MG TOTAL) BY MOUTH DAILY AFTER BREAKFAST. 90 tablet 0 11/16/2021  ? OneTouch Delica Lancets 23J MISC TEST 2 TIMES DAILY AS DIRECTED 100 each 5 11/16/2021  ? ONETOUCH VERIO test strip USE TO TEST TWICE A DAY 200 strip 3 11/16/2021  ? pantoprazole (PROTONIX) 40 MG tablet Take 1 tablet (40 mg total) by mouth daily. 90 tablet 3 11/16/2021  ? rosuvastatin (CRESTOR) 20 MG tablet TAKE 1 TABLET  BY MOUTH EVERY DAY 90 tablet 3 11/16/2021  ? sacubitril-valsartan (ENTRESTO) 24-26 MG Take 1 tablet by mouth 2 (two) times daily. 180 tablet 3 11/16/2021  ? sodium bicarbonate 650 MG tablet Take 650 mg by mouth 2 (two) times daily.   11/16/2021  ? warfarin (COUMADIN) 5 MG tablet Take 1 tablet (5 mg total) by mouth daily. (Patient taking differently: Take 5 mg by mouth daily. Alternates 2.5 x2 days then '5mg'$  alternating) 90 tablet 3 11/16/2021  ? colchicine 0.6 MG tablet Take twice daily for gout attack. (may take every two hours up to 6 doses at acute onset) (Patient not taking: Reported on 11/17/2021) 60 tablet 2 Not Taking  ? nitroGLYCERIN (NITROSTAT) 0.4 MG SL tablet Place 1 tablet (0.4 mg total) under the tongue every 5 (five) minutes as needed for chest pain. (Patient not taking: Reported on 11/17/2021) 50 tablet 10 Not Taking  ? ?Scheduled:  ? [START ON 11/18/2021] clopidogrel  75 mg Oral Q breakfast  ? furosemide  80 mg Intravenous BID  ? [START ON 11/18/2021] furosemide  80 mg Intravenous BID  ? insulin aspart  0-15 Units Subcutaneous TID WC  ? insulin aspart  0-5 Units Subcutaneous QHS  ? isosorbide mononitrate  30 mg Oral Daily  ? [START ON 11/18/2021] metoprolol succinate  100 mg Oral Daily  ? [START ON 11/18/2021] pantoprazole  40 mg Oral Daily  ? rosuvastatin  20 mg Oral Daily  ? sodium chloride flush  3 mL Intravenous Q12H  ? ? ?Assessment: ?89 you male here with HF. He is on warfarin PTA for a mechanical AVS and pharmacy consulted to dose warfarin.  ? ?PTA warfarin dose: 5 mg/day for 5 days then 2.5 x2 days then repeat. Verified with patient on 3/10. He is due for 2.'5mg'$  today then was to taking '5mg'$  for 5 days.  ? ?Goal of Therapy:  ?INR= 2.5-3.5 ?Monitor platelets by anticoagulation protocol: Yes ?  ?Plan:  ?-Warfarin 2.'5mg'$  today ?-Daily PT/INR ? ?Hildred Laser, PharmD ?Clinical Pharmacist ?**Pharmacist phone directory can now be found on [email or web address] ([email or web address]).  Listed under [place]. ? ? ? ?

## 2021-11-17 NOTE — Telephone Encounter (Signed)
Spoke with pt's wife this am and pt has had several complaints over the last 3 weeks Per wife pt had a bout of gout and then worsening kidney functions, abnormally high INR of 9 and currently is a 4  ,feet swelling and SOB unable to lay in bed has to sit up to breath Per wife PCP on Monday decreased Furosemide to 20 mg Pt's current weight is 227 lb Appt made with Dr Percival Spanish for 10:20 am today Pt's wife aware of appt time ./cy ?

## 2021-11-17 NOTE — H&P (Addendum)
Advanced Heart Failure Team History and Physical Note   PCP:  Claretta Fraise, MD  PCP-Cardiology: Minus Breeding, MD  Nephrology  : Dr Earnest Conroy  Reason for Admission: A/C HFrEF   HPI:   John [name] is a 71 year old with a history of CAD, S/P CABG 2002, GERD, HLD, HTN,  Mechanical MVR 2002, rheumatic fever, DMII, chronic A fib, St Jude ICD, CKD Stage IIIb, and HFrEF.  His wife tells me he has been having issues with gout flare, worsening renal function, and INR has been elevated. 2 weeks ago he developed leg edema and shortness of breath. Gradually worsening. +Orthopnea. Unable to sleep at night and has been sleeping in the chair. He does not weigh at home. Says his PCP had recommended cutting back diuretics but he continued to take lasix 40 mg daily.   11/08/21 INR  7 11/08/21 Creatinine   2.53    Today he was seen by  Dr Percival Spanish. Had marked volume overload and was sent for direct admit.   Cardiac Testing  Echo 2021 EF 25-30%  Echo 2019 EF 30-35%   RHC/LHC 06/23/22  Ost Cx lesion is 85% stenosed. Mid LAD lesion is 100% stenosed. Prox RCA lesion is 30% stenosed. Prox RCA to Dist RCA lesion is 80% stenosed. Origin lesion is 100% stenosed. Prox LAD to Mid LAD lesion is 70% stenosed. Successful PCI using orbital atherectomy and drug-eluting stent placement in the distal LMCA into the proximal LCx using a Resolute Onyx 2.75 x 26 mm drug-eluting stent.  Plaque shift into the ramus intermedius necessitated kissing balloon inflation.  Final angiogram demonstrates 0% residual stenosis with TIMI-3 flow.   Review of Systems: [y] = yes, '[ ]'$  = no   General: Weight gain [Y ]; Weight loss '[ ]'$ ; Anorexia '[ ]'$ ; Fatigue [Y ]; Fever '[ ]'$ ; Chills '[ ]'$ ; Weakness [ Y]  Cardiac: Chest pain/pressure '[ ]'$ ; Resting SOB '[ ]'$ ; Exertional SOB [ Y]; Orthopnea [Y ]; Pedal Edema [Y ]; Palpitations '[ ]'$ ; Syncope '[ ]'$ ; Presyncope '[ ]'$ ; Paroxysmal nocturnal dyspnea'[ ]'$   Pulmonary: Cough '[ ]'$ ; Wheezing'[ ]'$ ; Hemoptysis'[ ]'$ ; Sputum '[ ]'$ ;  Snoring '[ ]'$   GI: Vomiting'[ ]'$ ; Dysphagia'[ ]'$ ; Melena'[ ]'$ ; Hematochezia '[ ]'$ ; Heartburn'[ ]'$ ; Abdominal pain '[ ]'$ ; Constipation '[ ]'$ ; Diarrhea '[ ]'$ ; BRBPR '[ ]'$   GU: Hematuria'[ ]'$ ; Dysuria '[ ]'$ ; Nocturia'[ ]'$   Vascular: Pain in legs with walking '[ ]'$ ; Pain in feet with lying flat '[ ]'$ ; Non-healing sores '[ ]'$ ; Stroke '[ ]'$ ; TIA '[ ]'$ ; Slurred speech '[ ]'$ ;  Neuro: Headaches'[ ]'$ ; Vertigo'[ ]'$ ; Seizures'[ ]'$ ; Paresthesias'[ ]'$ ;Blurred vision '[ ]'$ ; Diplopia '[ ]'$ ; Vision changes '[ ]'$   Ortho/Skin: Arthritis '[ ]'$ ; Joint pain [ Y]; Muscle pain '[ ]'$ ; Joint swelling '[ ]'$ ; Back Pain [ Y]; Rash '[ ]'$   Psych: Depression'[ ]'$ ; Anxiety'[ ]'$   Heme: Bleeding problems '[ ]'$ ; Clotting disorders '[ ]'$ ; Anemia '[ ]'$   Endocrine: Diabetes [Y ]; Thyroid dysfunction'[ ]'$    Home Medications Prior to Admission medications   Medication Sig Start Date End Date Taking? Authorizing Provider  canagliflozin (INVOKANA) 300 MG TABS tablet Take 300 mg by mouth every other day. Patient not taking: Reported on 11/17/2021    [provider]  clopidogrel (PLAVIX) 75 MG tablet TAKE 1 TABLET BY MOUTH DAILY WITH BREAKFAST. 04/17/21   Furth, Cadence H, PA-C  colchicine 0.6 MG tablet Take twice daily for gout attack. (may take every two hours up to 6 doses at acute onset) 07/03/21  Claretta Fraise, MD  Febuxostat (ULORIC) 80 MG TABS Take 1 tablet (80 mg total) by mouth daily. 08/27/21   Claretta Fraise, MD  furosemide (LASIX) 40 MG tablet Take 1 tablet (40 mg total) by mouth daily. 10/25/21   Minus Breeding, MD  glipiZIDE (GLUCOTROL XL) 10 MG 24 hr tablet Take 1 tablet (10 mg total) by mouth daily. 06/09/21   Claretta Fraise, MD  insulin glargine (LANTUS) 100 UNIT/ML injection Inject 0.7 mLs (70 Units total) into the skin at bedtime. Patient taking differently: Inject 70 Units into the skin. Take 70 units if over 180 at bedtime 07/01/20   Furth, Cadence H, PA-C  isosorbide mononitrate (IMDUR) 30 MG 24 hr tablet Take 1 tablet (30 mg total) by mouth daily. 06/09/21   Claretta Fraise, MD   JANUVIA 50 MG tablet Take 1 tablet (50 mg total) by mouth daily. 11/08/21   Claretta Fraise, MD  metFORMIN (GLUCOPHAGE) 500 MG tablet Take 1 tablet (500 mg total) by mouth daily with breakfast. 08/10/21   Claretta Fraise, MD  metoprolol (TOPROL-XL) 200 MG 24 hr tablet TAKE 1 TABLET (200 MG TOTAL) BY MOUTH DAILY AFTER BREAKFAST. 10/23/21   Claretta Fraise, MD  nitroGLYCERIN (NITROSTAT) 0.4 MG SL tablet Place 1 tablet (0.4 mg total) under the tongue every 5 (five) minutes as needed for chest pain. 09/26/19   Claretta Fraise, MD  OneTouch Delica Lancets 40C MISC TEST 2 TIMES DAILY AS DIRECTED 02/24/21   Claretta Fraise, MD  [facility] VERIO test strip USE TO TEST TWICE A DAY 10/27/21   Claretta Fraise, MD  pantoprazole (PROTONIX) 40 MG tablet Take 1 tablet (40 mg total) by mouth daily. 06/09/21   Claretta Fraise, MD  rosuvastatin (CRESTOR) 20 MG tablet TAKE 1 TABLET BY MOUTH EVERY DAY 03/20/21   Minus Breeding, MD  sacubitril-valsartan (ENTRESTO) 24-26 MG Take 1 tablet by mouth 2 (two) times daily. 08/02/20   Almyra Deforest, PA  sodium bicarbonate 650 MG tablet Take 650 mg by mouth 2 (two) times daily.    [provider]  sodium zirconium cyclosilicate (LOKELMA) 10 g PACK packet Take 10 g by mouth every other day. Patient not taking: Reported on 11/17/2021    [provider]  warfarin (COUMADIN) 5 MG tablet Take 1 tablet (5 mg total) by mouth daily. 04/03/21   Claretta Fraise, MD    Past Medical History: Past Medical History:  Diagnosis Date   Abnormal nuclear stress test 05/10/2017   Allergy    Anemia    "lost blood w/the cancer"   Arthritis    back    Atrial fibrillation (HCC)    takes Coumadin daily   Bruises easily    takes Coumadin daily   CAD (coronary artery disease)    a. s/p CABG 11/2000 (L-LAD, S-RCA);  b. Lexiscan Myoview 11/12: EF 45%, ischemia and possibly some scar at the base of the inferolateral wall;   c. 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 s/p mechanical (St. Jude) MVR 2002;  b. Echo 5/11: Mild LVH, EF 45-50%, mild AS, mild AI, mean gradient 11 mmHg, MVR with normal gradients, moderate LAE, mild RAE;  c.  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   SOB (shortness of breath)    with exertion   Type II diabetes mellitus ([place])    takes Januvia,Glipizide,and Metformin daily as well as Lantus   Unstable angina ([place]) 06/21/2020    Past Surgical History: Past Surgical History:  Procedure Laterality Date   APPLICATION OF WOUND VAC  2014   CARDIAC CATHETERIZATION  2002   COLONOSCOPY     CORONARY ARTERY BYPASS GRAFT  2002   x 3   CORONARY ARTERY BYPASS GRAFT     CORONARY ATHERECTOMY N/A 06/24/2020   Procedure: CORONARY ATHERECTOMY;  Surgeon: [name], MD;  Location: [place] [facility];  Service: Cardiovascular;  Laterality: N/A;   CORONARY BALLOON ANGIOPLASTY N/A 06/24/2020   Procedure: CORONARY BALLOON ANGIOPLASTY;  Surgeon: [name], MD;  Location: [place] [facility];  Service: Cardiovascular;  Laterality: N/A;   CORONARY STENT INTERVENTION N/A 06/24/2020   Procedure: CORONARY  STENT INTERVENTION;  Surgeon: [name], MD;  Location: [place] [facility];  Service: Cardiovascular;  Laterality: N/A;   ESOPHAGOGASTRODUODENOSCOPY     HERNIA REPAIR     ICD IMPLANT N/A 02/19/2020   Procedure: ICD IMPLANT;  Surgeon: Evans Lance, MD;  Location: [place] [facility];  Service: Cardiovascular;  Laterality: N/A;   INCISIONAL HERNIA REPAIR N/A 03/15/2014   Procedure: LAPAROSCOPIC INCISIONAL HERNIA;  Surgeon: Harl Bowie, MD;  Location: [place];  Service: General;  Laterality: N/A;   INSERTION OF MESH N/A 03/15/2014   Procedure: INSERTION OF MESH;  Surgeon: Harl Bowie, MD;  Location: [place];  Service: General;  Laterality: N/A;   LAPAROSCOPIC INCISIONAL / UMBILICAL / [place]  03/15/2014   [facility]   LAPAROTOMY N/A 02/09/2013   Procedure: EXPLORATORY LAPAROTOMY WITH incisional biopsy intra- ABDOMINAL MASS ILOCECTOMY ;  Surgeon: Harl Bowie, MD;  Location: [facility];  Service: General;  Laterality: N/A;   MITRAL VALVE REPLACEMENT  11/2000   #33 St. Jude mechanical mitral valve prosthesis. Dr. Servando Snare   [facility] REMOVAL  03/15/2014   PORT-A-CATH REMOVAL Left 03/15/2014   Procedure: REMOVAL PORT-A-CATH;  Surgeon: Harl Bowie, MD;  Location: [place];  Service: General;  Laterality: Left;   PORTACATH PLACEMENT N/A 02/09/2013   Procedure: INSERTION PORT-A-CATH;  Surgeon: Harl Bowie, MD;  Location: [facility];  Service: General;  Laterality: N/A;   RIGHT/LEFT HEART CATH AND CORONARY/GRAFT ANGIOGRAPHY N/A 05/10/2017   Procedure: RIGHT/LEFT HEART CATH AND CORONARY/GRAFT ANGIOGRAPHY;  Surgeon: Martinique, Peter M, MD;  Location: [place] [facility];  Service: Cardiovascular;  Laterality: N/A;   RIGHT/LEFT HEART CATH AND CORONARY/GRAFT ANGIOGRAPHY N/A 06/23/2020   Procedure: RIGHT/LEFT HEART CATH AND CORONARY/GRAFT ANGIOGRAPHY;  Surgeon: Claiborne Billings,  Joyice Faster, MD;  Location: [place] [facility];  Service: Cardiovascular;  Laterality: N/A;   SEPTOPLASTY     SMALL INTESTINE  SURGERY      Family History:  Family History  Problem Relation Age of Onset   Diabetes Mother    Hypertension Father    Thyroid disease Sister    Diabetes Brother    Arthritis Brother    Depression Brother    Heart disease Brother    Hyperlipidemia Brother    Hypertension Brother    Hypertension Brother    Diabetes Brother    Heart disease Brother    Hyperlipidemia Brother     Social History: Social History   Socioeconomic History   Marital status: Married    Spouse name: Marcie Bal   Number of children: 1   Years of education: 12   Highest education level: High school graduate  Occupational History   Occupation: disabled  Tobacco Use   Smoking status: Former    Types: Cigars   Smokeless tobacco: Never  Vaping Use   Vaping Use: Never used  Substance and Sexual Activity   Alcohol use: No    Comment: "drank for 30 years; quit ~ 2000"   Drug use: No    Types: Marijuana    Comment: quit in 2000 'reefer'   Sexual activity: Yes  Other Topics Concern   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   Social Determinants of Health   Financial Resource Strain: Not on file  Food Insecurity: Not on file  Transportation Needs: Not on file  Physical Activity: Not on file  Stress: Not on file  Social Connections: Not on file    Allergies:  Allergies  Allergen Reactions   Allopurinol Shortness Of Breath   Doxycycline Hives   Farxiga [Dapagliflozin]     Can not tolerate     Objective:    Vital Signs:   Temp:  [97.9 F (36.6 C)] 97.9 F (36.6 C) (03/10 1214) Pulse Rate:  [76] 76 (03/10 1214) Resp:  [18] 18 (03/10 1214) BP: (109)/(68) 109/68 (03/10 1214) SpO2:  [100 %] 100 % (03/10 1214) Weight:  [103 kg] 103 kg (03/10 1214)   Filed Weights   11/17/21 1214  Weight: 103 kg     Physical Exam     General:   No respiratory difficulty HEENT: Normal Neck: Supple. JVP o JVD. Carotids 2+ bilat; no bruits. No  lymphadenopathy or thyromegaly appreciated. Cor: PMI nondisplaced. Irregular rate & rhythm. No rubs, gallops or murmurs. Lungs: Clear Abdomen: Soft, nontender, nondistended. No hepatosplenomegaly. No bruits or masses. Good bowel sounds. Extremities: No cyanosis, clubbing, rash, R and LLE 2+ edema Neuro: Alert & oriented x 3, cranial nerves grossly intact. moves all 4 extremities w/o difficulty. Affect pleasant.   Telemetry   A fib 80s  EKG   N/A   Labs     Basic Metabolic Panel: No results for input(s): NA, K, CL, CO2, GLUCOSE, BUN, CREATININE, CALCIUM, MG, PHOS in the last 168 hours.  Liver Function Tests: No results for input(s): AST, ALT, ALKPHOS, BILITOT, PROT, ALBUMIN in the last 168 hours. No results for input(s): LIPASE, AMYLASE in the last 168 hours. No results for input(s): AMMONIA in the last 168 hours.  CBC: No results for input(s): WBC, NEUTROABS, HGB, HCT, MCV, PLT in the last 168 hours.  Cardiac Enzymes: No results for input(s): CKTOTAL, CKMB, CKMBINDEX, TROPONINI in the last 168 hours.  BNP:  BNP (last 3 results) No results for input(s): BNP in the last 8760 hours.  ProBNP (last 3 results) No results for input(s): PROBNP in the last 8760 hours.   CBG: No results for input(s): GLUCAP in the last 168 hours.  Coagulation Studies: No results for input(s): LABPROT, INR in the last 72 hours.  Imaging: No results found.   Patient Profile   John [name] is a 71 year old with a history of CAD, S/P CABG 2002, GERD, HLD, HTN,  Mechanical MVR 2002, rheumatic fever, DMII, chronic A fib, St Jude ICD, CKD Stage IIIb, and HFrEF.  Admitted with A/C HFrEF  Assessment/Plan  1. A/C HFrEF, ICM  -Echo 2021 EF 25-30% . Repeat ECHO.  - Has St Jude ICD. Interrogate device.  -Marked volume overload. Not sure what triggered this event. No chest pain. Place PICC line for CO-OX and CVP . Give 80 mg IV lasix now and start 80 mg twice a day  - Continue bb but will dose in  half. Toprol XL 100 mg daily starting tomorrow.  - No Spiro/Entresto with recent AKI/hyperkalemia  - No SGLT2i due to allergy.  - Place unna boots.  - Follow up renal function. May need inotropes but will check CO-OX first.   2. CKD Stage IIIB -Creatinine baseline 2.1  -Check BMET now.   3. Mechanical Mitral Valve Repair -On coumadin. INR recently 7.  -Check INR now.   4. CAD -Had CABG 2002  -Most recent [place] 06/2020-DES distal LMCA into the proximal LCx -Continue statin, imdur, and bb  - Check HS trop.   5. DMII Start SSI Check Hgb A1C   6. Chronic A fib  Rate Controlled.  Cut BB dose in half.  On coumadin.  Check INR now.   Check cbc, cmet, tsh, inr, hgb a1c, HS trop,and BNP.   Darrick Grinder, NP 11/17/2021, 1:03 PM  Advanced Heart Failure Team Pager [phone] (M-F; 7a - 5p)  Please contact [facility] for night-coverage after hours (4p -7a ) and weekends on [email or web address]  Patient seen with NP, agree with the above note.   He was admitted today from clinic by Dr. Percival Spanish.  He has been increasingly short of breath x 2 wks.  He has had orthopnea and bendopnea.  No chest pain, no lightheadedness.  Significant lower extremity edema.  History as outlined above.   General: NAD Neck: JVP 14 cm, no thyromegaly or thyroid nodule.  Lungs: Clear to auscultation bilaterally with normal respiratory effort. CV: Nondisplaced PMI.  Heart irregular S1/S2 with mechanical S1, no S3/S4, 1/6 SEM.  2+ edema to knees.  No carotid bruit.  Difficult to palpate pedal pulses.  Abdomen: Soft, nontender, no hepatosplenomegaly, no distention.  Skin: Intact without lesions or rashes.  Neurologic: Alert and oriented x 3.  Psych: Normal affect. Extremities: No clubbing or cyanosis.  HEENT: Normal.   1. Acute on chronic systolic CHF: Last echo in 4/21 with EF 25-30%, severe LV dilation, normal RV, mild AS and AI, normal mechanical MV.  St Jude ICD. Suspect ischemic cardiomyopathy.  He is markedly  volume overloaded on exam.  Creatinine is 2.5, baseline appears to be around 2.1.  He is not particularly cool on exam.  However, with rise in creatinine, I am concerned for possible low output.  He needs extensive diuresis but will have to be careful with cardiorenal syndrome.  - Will start with Lasix 80 mg IV bid, however need labs (ordered stat but still not back).  -  Will arrange for PICC line, follow CVP and co-ox.  If co-ox low, will start milrinone.  - Needs echo (last was in 4/21).  - Decrease Toprol XL to 100 mg daily.  - Hold Entresto with elevated creatinine.  - Eventually would use dapagliflozin (was on canagliflozin at home).  - Unna boots.  2. CAD: s/p CABG with MVR in 2002.  Last cath 10/21 with occluded SVG-RCA, and 80% mRCA, totally occluded LAD, patent LIMA-LAD, 85% ostial LCx treated with DES.  No chest pain, doubt ACS.  - Continue Plavix.  - Continue Crestor.  3. Atrial fibrillation: Permanent.  - Warfarin per pharmacy.  4. 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   - Echo this admission.  5. AKI on CKD stage 3: Creatinine up to 2.5 recently , base around 2.1.  Suspect cardiorenal.  - Needs BMET stat.   Loralie Champagne 11/17/2021 4:33 PM  Labs have finally returned, creatinine 3.46 with co-ox 54%.  - Will start milrinone 0.25 - Will decrease Toprol XL to 50 mg daily.  - Keep Lasix at current dosing and follow UOP and creatinine trend on milrinone.   Loralie Champagne 11/17/2021 6:27 PM

## 2021-11-17 NOTE — Telephone Encounter (Signed)
Patient's wife requested that we return the call to their home phone at [phone]. ?

## 2021-11-18 DIAGNOSIS — I5023 Acute on chronic systolic (congestive) heart failure: Secondary | ICD-10-CM | POA: Diagnosis not present

## 2021-11-18 LAB — BASIC METABOLIC PANEL
Anion gap: 11 (ref 5–15)
BUN: 78 mg/dL — ABNORMAL HIGH (ref 8–23)
CO2: 23 mmol/L (ref 22–32)
Calcium: 8.7 mg/dL — ABNORMAL LOW (ref 8.9–10.3)
Chloride: 104 mmol/L (ref 98–111)
Creatinine, Ser: 3.2 mg/dL — ABNORMAL HIGH (ref 0.61–1.24)
GFR, Estimated: 20 mL/min — ABNORMAL LOW (ref >60)
Glucose, Bld: 172 mg/dL — ABNORMAL HIGH (ref 70–99)
Potassium: 3.8 mmol/L (ref 3.5–5.1)
Sodium: 138 mmol/L (ref 135–145)

## 2021-11-18 LAB — PROTIME-INR
INR: 2.9 — ABNORMAL HIGH (ref 0.8–1.2)
Prothrombin Time: 30.2 seconds — ABNORMAL HIGH (ref 11.4–15.2)

## 2021-11-18 LAB — HEMOGLOBIN A1C
Hgb A1c MFr Bld: 7 % — ABNORMAL HIGH (ref 4.8–5.6)
Mean Plasma Glucose: 154 mg/dL

## 2021-11-18 LAB — GLUCOSE, CAPILLARY
Glucose-Capillary: 164 mg/dL — ABNORMAL HIGH (ref 70–99)
Glucose-Capillary: 209 mg/dL — ABNORMAL HIGH (ref 70–99)
Glucose-Capillary: 298 mg/dL — ABNORMAL HIGH (ref 70–99)
Glucose-Capillary: 179 mg/dL — ABNORMAL HIGH (ref 70–99)

## 2021-11-18 LAB — COOXEMETRY PANEL
Carboxyhemoglobin: 0.8 % (ref 0.5–1.5)
Methemoglobin: <0.7 % (ref 0.0–1.5)
O2 Saturation: 66.5 %
Total hemoglobin: 12 g/dL (ref 12.0–16.0)

## 2021-11-18 MED ORDER — METOLAZONE 2.5 MG PO TABS
2.5000 mg | ORAL_TABLET | Freq: Once | ORAL | Status: AC
Start: 2021-11-18 — End: 2021-11-18
  Administered 2021-11-18: 2.5 mg via ORAL
  Filled 2021-11-18: qty 1

## 2021-11-18 MED ORDER — DIPHENHYDRAMINE HCL 25 MG PO CAPS
25.0000 mg | ORAL_CAPSULE | Freq: Four times a day (QID) | ORAL | Status: DC | PRN
  Administered 2021-11-18 – 2021-11-26 (×7): 25 mg via ORAL
  Filled 2021-11-18 (×7): qty 1

## 2021-11-18 MED ORDER — WARFARIN SODIUM 5 MG PO TABS
5.0000 mg | ORAL_TABLET | Freq: Once | ORAL | Status: AC
  Administered 2021-11-18: 5 mg via ORAL
  Filled 2021-11-18: qty 1

## 2021-11-18 MED ORDER — POTASSIUM CHLORIDE CRYS ER 20 MEQ PO TBCR
40.0000 meq | EXTENDED_RELEASE_TABLET | Freq: Once | ORAL | Status: AC
Start: 2021-11-18 — End: 2021-11-18
  Administered 2021-11-18: 40 meq via ORAL
  Filled 2021-11-18: qty 2

## 2021-11-18 MED ORDER — CALCIUM CARBONATE ANTACID 500 MG PO CHEW
2.0000 | CHEWABLE_TABLET | Freq: Three times a day (TID) | ORAL | Status: DC | PRN
  Administered 2021-11-18 – 2021-11-28 (×3): 400 mg via ORAL
  Filled 2021-11-18 (×3): qty 2

## 2021-11-18 NOTE — Progress Notes (Signed)
ANTICOAGULATION CONSULT NOTE - Initial Consult ? ?Pharmacy Consult for warfarin ?Indication: Mechanical MVR ? ?Allergies  ?Allergen Reactions  ? Allopurinol Shortness Of Breath  ? Doxycycline Hives  ? Wilder Glade [Dapagliflozin]   ?  Can not tolerate - dizzy weak almost passed out  ? ? ?Patient Measurements: ?Height: '5\' 11"'$  (180.3 cm) ?Weight: 101.6 kg (223 lb 15.8 oz) ?IBW/kg (Calculated) : 75.3 ? ?Vital Signs: ?Temp: 98 ?F (36.7 ?C) (03/11 1275) ?Temp Source: Oral (03/11 1700) ?BP: 109/50 (03/11 0713) ?Pulse Rate: 79 (03/11 0713) ? ?Labs: ?Recent Labs  ?  11/17/21 ?1155 11/18/21 ?0424  ?HGB 13.1  --   ?HCT 41.4  --   ?PLT 158  --   ?LABPROT 29.1* 30.2*  ?INR 2.8* 2.9*  ?CREATININE 3.46* 3.20*  ?TROPONINIHS 53*  --   ? ? ?Estimated Creatinine Clearance: 26.8 mL/min (A) (by C-G formula based on SCr of 3.2 mg/dL (H)). ? ? ?Medical History: ?Past Medical History:  ?Diagnosis Date  ? Abnormal nuclear stress test 05/10/2017  ? Allergy   ? Anemia   ? "lost blood w/the cancer"  ? Arthritis   ? back   ? Atrial fibrillation ([place])   ? takes Coumadin daily  ? Bruises easily   ? takes Coumadin daily  ? CAD (coronary artery disease)   ? a. s/p CABG 11/2000 (L-LAD, S-RCA);  b. Lexiscan Myoview 11/12: EF 45%, ischemia and possibly some scar at the base of the inferolateral wall;   c. Lex MV 11/13:  EF 44%, Inf and IL scar/soft tissue atten, small mild amt of inf ischemia,  ? CHF (congestive heart failure) ([place])   ? Constipation   ? takes Colace daily  ? 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 Enlarged prostate   ? Flu 09/2013  ? GERD (gastroesophageal reflux disease)   ? takes Nexium daily  ? Glaucoma   ? History of blood transfusion   ? "related to cancer"  ? History of colon polyps   ? HLD (hyperlipidemia)   ? takes Vytorin daily  ? HTN (hypertension)   ? takes Amlodipine,Metoprolol, and Ramipril daily  ? Insomnia   ? states he has always been like this.But doesn't take any meds  ? Kidney stones   ? "I passed them all"  ?  Nocturia   ? Peripheral edema   ? takes Furosemide daily  ? Peripheral neuropathy   ? Rheumatic fever ~ 1965  ? Rheumatic heart disease   ? a. s/p mechanical (St. Jude) MVR 2002;  b. Echo 5/11: Mild LVH, EF 45-50%, mild AS, mild AI, mean gradient 11 mmHg, MVR with normal gradients, moderate LAE, mild RAE;  c.  Echo 11/13:   mod LVH, EF 55-60%, mild AS (mean 18 mmHg), mild AI, severe LAE, mild RAE, PASP 41  ? 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 SOB (shortness of breath)   ? with exertion  ? Type II diabetes mellitus ([place])   ? takes Januvia,Glipizide,and Metformin daily as well as Lantus  ? Unstable angina ([place]) 06/21/2020  ? ? ?Medications:  ?Medications Prior to Admission  ?Medication Sig Dispense Refill Last Dose  ? clopidogrel (PLAVIX) 75 MG tablet TAKE 1 TABLET BY MOUTH DAILY WITH BREAKFAST. 90 tablet 3 11/16/2021  ? furosemide (LASIX) 40 MG tablet Take 1 tablet (40 mg total) by mouth daily. 90 tablet 2 11/16/2021  ? glipiZIDE (GLUCOTROL XL) 10 MG 24 hr tablet Take 1 tablet (10 mg total)  by mouth daily. 90 tablet 3 11/16/2021  ? insulin glargine (LANTUS) 100 UNIT/ML injection Inject 0.7 mLs (70 Units total) into the skin at bedtime. (Patient taking differently: Inject 70 Units into the skin. Take 70 units if over 150 daily) 10 mL 11 Past Month  ? isosorbide mononitrate (IMDUR) 30 MG 24 hr tablet Take 1 tablet (30 mg total) by mouth daily. 90 tablet 3 11/16/2021  ? JANUVIA 50 MG tablet Take 1 tablet (50 mg total) by mouth daily. 90 tablet 2 11/16/2021  ? metFORMIN (GLUCOPHAGE) 500 MG tablet Take 1 tablet (500 mg total) by mouth daily with breakfast. 90 tablet 3 11/16/2021  ? metoprolol (TOPROL-XL) 200 MG 24 hr tablet TAKE 1 TABLET (200 MG TOTAL) BY MOUTH DAILY AFTER BREAKFAST. 90 tablet 0 11/16/2021  ? OneTouch Delica Lancets 19F MISC TEST 2 TIMES DAILY AS DIRECTED 100 each 5 11/16/2021  ? ONETOUCH VERIO test strip USE TO TEST TWICE A DAY 200 strip 3 11/16/2021  ? pantoprazole (PROTONIX) 40  MG tablet Take 1 tablet (40 mg total) by mouth daily. 90 tablet 3 11/16/2021  ? rosuvastatin (CRESTOR) 20 MG tablet TAKE 1 TABLET BY MOUTH EVERY DAY 90 tablet 3 11/16/2021  ? sacubitril-valsartan (ENTRESTO) 24-26 MG Take 1 tablet by mouth 2 (two) times daily. 180 tablet 3 11/16/2021  ? sodium bicarbonate 650 MG tablet Take 650 mg by mouth 2 (two) times daily.   11/16/2021  ? warfarin (COUMADIN) 5 MG tablet Take 1 tablet (5 mg total) by mouth daily. (Patient taking differently: Take 5 mg by mouth daily. Alternates 2.5 x2 days then '5mg'$  alternating) 90 tablet 3 11/16/2021  ? colchicine 0.6 MG tablet Take twice daily for gout attack. (may take every two hours up to 6 doses at acute onset) (Patient not taking: Reported on 11/17/2021) 60 tablet 2 Not Taking  ? nitroGLYCERIN (NITROSTAT) 0.4 MG SL tablet Place 1 tablet (0.4 mg total) under the tongue every 5 (five) minutes as needed for chest pain. (Patient not taking: Reported on 11/17/2021) 50 tablet 10 Not Taking  ? ?Scheduled:  ? Chlorhexidine Gluconate Cloth  6 each Topical Daily  ? clopidogrel  75 mg Oral Q breakfast  ? furosemide  80 mg Intravenous BID  ? insulin aspart  0-15 Units Subcutaneous TID WC  ? insulin aspart  0-5 Units Subcutaneous QHS  ? metoprolol succinate  50 mg Oral Daily  ? pantoprazole  40 mg Oral Daily  ? rosuvastatin  20 mg Oral Daily  ? sodium chloride flush  10-40 mL Intracatheter Q12H  ? sodium chloride flush  3 mL Intravenous Q12H  ? Warfarin - Pharmacist Dosing Inpatient   Does not apply [id number]  ? ? ?Assessment: ?47 you male here with HF exacerbation. He is on warfarin PTA for a mechanical AVS and pharmacy consulted to dose warfarin.  ? ?PTA warfarin dose: 5 mg/day for 5 days then 2.5 x2 days then repeat. Verified with patient on 3/10.  ? ?INR 2.9 therapeutic today. Received 2.5 mg dose yesterday, due for 5 mg x5 days now. CBC stable. ? ?Goal of Therapy:  ?INR= 2.5-3.5 ?Monitor platelets by anticoagulation protocol: Yes ?  ?Plan:  ?-Warfarin '5mg'$  x1  today ?-Daily PT/INR ? ?Laurey Arrow, PharmD ?[id number] Pharmacy Resident ?11/18/2021  9:03 AM ? ?Please check [email or web address] for unit-specific pharmacy phone numbers. ? ?

## 2021-11-18 NOTE — Progress Notes (Addendum)
Advanced Heart Failure Rounding Note   Subjective:    Diuresing well on IV lasix and milrinone.   Co-ox 54->67% CVP 9-10   Feeling better. Less SOB. No orthopnea or PND.   Scr 3.46 -> 3.20  Echo 11/17/21 EF 20-25% RV severely HK. Mod AS/AI ? Low flow severe AS mean grad 18  DI 0.36   Objective:   Weight Range:  Vital Signs:   Temp:  [97.9 F (36.6 C)-98.3 F (36.8 C)] 98.1 F (36.7 C) (03/11 1057) Pulse Rate:  [68-87] 87 (03/11 1057) Resp:  [17-19] 18 (03/11 1057) BP: (91-115)/(50-70) 112/60 (03/11 1057) SpO2:  [97 %-100 %] 98 % (03/11 1057) Weight:  [101.6 kg-103 kg] 101.6 kg (03/11 0413) Last BM Date : 11/16/21  Weight change: Filed Weights   11/17/21 1214 11/18/21 0413  Weight: 103 kg 101.6 kg    Intake/Output:   Intake/Output Summary (Last 24 hours) at 11/18/2021 1112 Last data filed at 11/18/2021 1100 Gross per 24 hour  Intake 1455.48 ml  Output 3740 ml  Net -2284.52 ml     Physical Exam: General:  Sitting up in bed. No resp difficulty HEENT: normal Neck: supple. JVP 9-10 . Carotids 2+ bilat; no bruits. No lymphadenopathy or thryomegaly appreciated. Cor: PMI nondisplaced. Irregular rate & rhythm.2/6 AS mech s1 Lungs: clear Abdomen: soft, nontender, nondistended. No hepatosplenomegaly. No bruits or masses. Good bowel sounds. Extremities: no cyanosis, clubbing, rash, 3-4+ edema into thighs + UNNA Neuro: alert & orientedx3, cranial nerves grossly intact. moves all 4 extremities w/o difficulty. Affect pleasant  Telemetry: AF 80-90 Personally reviewed  Labs: Basic Metabolic Panel: Recent Labs  Lab 11/17/21 1155 11/18/21 0424  NA 140 138  K 4.4 3.8  CL 105 104  CO2 22 23  GLUCOSE 188* 172*  BUN 81* 78*  CREATININE 3.46* 3.20*  CALCIUM 8.9 8.7*  MG 1.8  --     Liver Function Tests: Recent Labs  Lab 11/17/21 1155  AST 56*  ALT 52*  ALKPHOS 143*  BILITOT 1.5*  PROT 6.0*  ALBUMIN 3.1*   No results for input(s): LIPASE, AMYLASE in  the last 168 hours. No results for input(s): AMMONIA in the last 168 hours.  CBC: Recent Labs  Lab 11/17/21 1155  WBC 5.1  NEUTROABS 3.7  HGB 13.1  HCT 41.4  MCV 81.2  PLT 158    Cardiac Enzymes: No results for input(s): CKTOTAL, CKMB, CKMBINDEX, TROPONINI in the last 168 hours.  BNP: BNP (last 3 results) Recent Labs    11/17/21 1155  BNP >4,500.0*    ProBNP (last 3 results) No results for input(s): PROBNP in the last 8760 hours.    Other results:  Imaging: DG CHEST PORT 1 VIEW  Result Date: 11/17/2021 CLINICAL DATA:  PICC placement. EXAM: PORTABLE CHEST 1 VIEW COMPARISON:  June 20, 2020. FINDINGS: Stable cardiomegaly. Status post mitral valve repair. Status post coronary bypass graft. Single lead right-sided defibrillator is unchanged in position. There is been interval placement of left-sided PICC line with distal tip in expected position of the SVC. Minimal bibasilar subsegmental atelectasis is noted. Probable small left pleural effusion. Bony thorax is unremarkable. IMPRESSION: Left-sided PICC line is noted with distal tip in expected position of the SVC. Electronically Signed   By: Marijo Conception M.D.   On: 11/17/2021 17:13   ECHOCARDIOGRAM COMPLETE  Result Date: 11/17/2021    ECHOCARDIOGRAM REPORT   Patient Name:   John [name] Date of Exam: 11/17/2021 Medical Rec #:  [id number]        Height:       71.0 in Accession #:    [id number]       Weight:       227.0 lb Date of Birth:  [birth year]        BSA:          2.225 m Patient Age:    71 years         BP:           109/68 mmHg Patient Gender: M                HR:           69 bpm. Exam Location:  Inpatient Procedure: 2D Echo and Intracardiac Opacification Agent Indications:    congestive heart failure  History:        Patient has prior history of Echocardiogram examinations, most                 recent 12/23/2019. Cardiomyopathy and CHF, Defibrillator, Aortic                 Valve Disease, Arrythmias:Atrial  Fibrillation; Risk                 Factors:Dyslipidemia, Diabetes and Hypertension.                  Mitral Valve: valve is present in the mitral position.  Sonographer:    Unknown Referring Phys: [id number] AMY D CLEGG IMPRESSIONS  1. Left ventricular ejection fraction, by estimation, is 20 to 25%. The left ventricle has severely decreased function. The left ventricle demonstrates global hypokinesis. The left ventricular internal cavity size was moderately dilated. Left ventricular diastolic parameters are indeterminate. No LV thrombus.  2. Right ventricular systolic function is severely reduced. The right ventricular size is normal. There is severely elevated pulmonary artery systolic pressure. The estimated right ventricular systolic pressure is 97.0 mmHg.  3. Left atrial size was severely dilated.  4. Right atrial size was mildly dilated.  5. Moderate pleural effusion in the left lateral region.  6. The mitral valve has been replaced by a mechanical bileafelt valve. No evidence of mitral valve regurgitation, no PVL.  7. Tricuspid valve regurgitation is moderate to severe. There is systolic flow reversal of the hepatic vein.  8. The aortic valve is calcified. Aortic valve regurgitation is moderate. Moderate aortic valve stenosis. Aortic valve mean gradient measures 15.0 mmHg. There is a component of low flow low gradient aortic stenosis.  9. The inferior vena cava is dilated in size with >50% respiratory variability, suggesting right atrial pressure of 8 mmHg. Comparison(s): RV function is worse from prior. FINDINGS  Left Ventricle: Left ventricular ejection fraction, by estimation, is 20 to 25%. The left ventricle has severely decreased function. The left ventricle demonstrates global hypokinesis. Definity contrast agent was given IV to delineate the left ventricular endocardial borders. The left ventricular internal cavity size was moderately dilated. There is no left ventricular hypertrophy. Left ventricular  diastolic parameters are indeterminate. Right Ventricle: The right ventricular size is normal. No increase in right ventricular wall thickness. Right ventricular systolic function is severely reduced. There is severely elevated pulmonary artery systolic pressure. The tricuspid regurgitant velocity is 3.61 m/s, and with an assumed right atrial pressure of 8 mmHg, the estimated right ventricular systolic pressure is 26.3 mmHg. Left Atrium: Left atrial size was severely dilated. Right Atrium: Right atrial size was mildly dilated. Pericardium: Trivial pericardial effusion  is present. Mitral Valve: The mitral valve has been repaired/replaced. No evidence of mitral valve regurgitation. There is a present in the mitral position. MV peak gradient, 10.5 mmHg. The mean mitral valve gradient is 5.7 mmHg. Tricuspid Valve: The tricuspid valve is normal in structure. Tricuspid valve regurgitation is moderate to severe. No evidence of tricuspid stenosis. Aortic Valve: The aortic valve is calcified. Aortic valve regurgitation is moderate. Aortic regurgitation PHT measures 257 msec. Moderate aortic stenosis is present. Aortic valve mean gradient measures 15.0 mmHg. Aortic valve peak gradient measures 18.0 mmHg. Aortic valve area, by VTI measures 1.41 cm. Pulmonic Valve: The pulmonic valve was normal in structure. Pulmonic valve regurgitation is not visualized. No evidence of pulmonic stenosis. Aorta: The aortic root and ascending aorta are structurally normal, with no evidence of dilitation. Venous: The inferior vena cava is dilated in size with greater than 50% respiratory variability, suggesting right atrial pressure of 8 mmHg. IAS/Shunts: No atrial level shunt detected by color flow Doppler. Additional Comments: A device lead is visualized. There is a moderate pleural effusion in the left lateral region.  LEFT VENTRICLE PLAX 2D LVIDd:         6.50 cm LVIDs:         6.20 cm LV PW:         0.90 cm LV IVS:        1.00 cm LVOT diam:      2.30 cm LV SV:         59 LV SV Index:   27 LVOT Area:     4.15 cm  RIGHT VENTRICLE         IVC TAPSE (M-mode): 0.9 cm  IVC diam: 2.30 cm LEFT ATRIUM              Index        RIGHT ATRIUM           Index LA diam:        6.30 cm  2.83 cm/m   RA Area:     24.00 cm LA Vol (A2C):   148.0 ml 66.51 ml/m  RA Volume:   81.00 ml  36.40 ml/m LA Vol (A4C):   131.0 ml 58.87 ml/m LA Biplane Vol: 147.0 ml 66.06 ml/m  AORTIC VALVE AV Area (Vmax):    1.24 cm AV Area (Vmean):   1.28 cm AV Area (VTI):     1.41 cm AV Vmax:           212.20 cm/s AV Vmean:          142.200 cm/s AV VTI:            0.419 m AV Peak Grad:      18.0 mmHg AV Mean Grad:      15.0 mmHg LVOT Vmax:         63.48 cm/s LVOT Vmean:        43.825 cm/s LVOT VTI:          0.142 m LVOT/AV VTI ratio: 0.34 AI PHT:            257 msec  AORTA Ao Root diam: 3.40 cm Ao Asc diam:  3.40 cm MITRAL VALVE              TRICUSPID VALVE MV Area VTI:  1.38 cm    TR Peak grad:   52.1 mmHg MV Peak grad: 10.5 mmHg   TR Vmax:        361.00 cm/s MV Mean grad: 5.7  mmHg MV Vmax:      1.62 m/s    SHUNTS MV Vmean:     113.3 cm/s  Systemic VTI:  0.14 m                           Systemic Diam: 2.30 cm Rudean Haskell MD Electronically signed by Rudean Haskell MD Signature Date/Time: 11/17/2021/5:22:00 PM    Final    [place] EKG SITE RITE  Result Date: 11/17/2021 If Site Rite image not attached, placement could not be confirmed due to current cardiac rhythm.    Medications:     Scheduled Medications:  Chlorhexidine Gluconate Cloth  6 each Topical Daily   clopidogrel  75 mg Oral Q breakfast   furosemide  80 mg Intravenous BID   insulin aspart  0-15 Units Subcutaneous TID WC   insulin aspart  0-5 Units Subcutaneous QHS   metoprolol succinate  50 mg Oral Daily   pantoprazole  40 mg Oral Daily   rosuvastatin  20 mg Oral Daily   sodium chloride flush  10-40 mL Intracatheter Q12H   sodium chloride flush  3 mL Intravenous Q12H   warfarin  5 mg Oral ONCE-1600    Warfarin - Pharmacist Dosing Inpatient   Does not apply q1600    Infusions:  sodium chloride     milrinone 0.25 mcg/kg/min (11/18/21 0431)    PRN Medications: sodium chloride, acetaminophen, calcium carbonate, ondansetron (ZOFRAN) IV, sodium chloride flush, sodium chloride flush   Assessment/Plan:   1. Acute on chronic systolic CHF: Last echo in 4/21 with EF 25-30%, severe LV dilation, normal RV, mild AS and AI, normal mechanical MV.  St Jude ICD. Suspect ischemic cardiomyopathy.  He is markedly volume overloaded on exam.  Creatinine is 2.5, baseline appears to be around 2.1.  Initial co-ox 55% -> 67% with milrinone. CVP 9-10. Significant volume overload on exam. Weight down 4 pounds over night.  - Continue Lasix 80 mg IV bid add metoalzone 2.5 today -  Echo 11/17/21 EF 20-25% RV severely HK. Mod AS/AI ? Low flow severe AS mean grad 18  DI 0.36. MVR ok  - Decreased Toprol XL to 100 mg daily.  - Hold Entresto with elevated creatinine.  - Eventually would use dapagliflozin (was on canagliflozin at home).  - Continue Unna boots.  2. CAD: s/p CABG with MVR in 2002.  Last cath 10/21 with occluded SVG-RCA, and 80% mRCA, totally occluded LAD, patent LIMA-LAD, 85% ostial LCx treated with DES.  No chest pain, doubt ACS.  - Continue Plavix.  - Continue Crestor.  3. Atrial fibrillation: Permanent.  - Rate controlled - Warfarin per pharmacy.  4. 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   - MVR stable on echo  - INR 2.9 (goal 2.5-3.5). Dosing per pharmacy  5. AKI on CKD stage 3: Creatinine up to 2.5 recently , base around 2.1.  Suspect cardiorenal.  - SCr improved with milrinone 3.46 -> 3.20 - Continue milrinone  6. Possible low gradient/low flow AS -  Echo 11/17/21 EF 20-25% RV severely HK. Mod AS/AI ? Low flow severe AS mean grad 18  DI 0.36. MVR ok  - Consider TEE to further evaluate  7. Transaminitis. mild - likely due to passive congestion  - diurese  Length of Stay: 1   Glori Bickers MD 11/18/2021, 11:12 AM  Advanced Heart Failure Team Pager [phone] (M-F; [place])  Please contact [facility] for night-coverage after hours (  4p -7a ) and weekends on [email or web address]

## 2021-11-19 DIAGNOSIS — I5023 Acute on chronic systolic (congestive) heart failure: Secondary | ICD-10-CM | POA: Diagnosis not present

## 2021-11-19 LAB — GLUCOSE, CAPILLARY
Glucose-Capillary: 202 mg/dL — ABNORMAL HIGH (ref 70–99)
Glucose-Capillary: 257 mg/dL — ABNORMAL HIGH (ref 70–99)
Glucose-Capillary: 282 mg/dL — ABNORMAL HIGH (ref 70–99)
Glucose-Capillary: 197 mg/dL — ABNORMAL HIGH (ref 70–99)

## 2021-11-19 LAB — COOXEMETRY PANEL
Carboxyhemoglobin: 0.5 % (ref 0.5–1.5)
Methemoglobin: <0.7 % (ref 0.0–1.5)
O2 Saturation: 67.8 %
Total hemoglobin: 11.7 g/dL — ABNORMAL LOW (ref 12.0–16.0)

## 2021-11-19 LAB — BASIC METABOLIC PANEL
Anion gap: 8 (ref 5–15)
BUN: 71 mg/dL — ABNORMAL HIGH (ref 8–23)
CO2: 26 mmol/L (ref 22–32)
Calcium: 8.8 mg/dL — ABNORMAL LOW (ref 8.9–10.3)
Chloride: 103 mmol/L (ref 98–111)
Creatinine, Ser: 3.15 mg/dL — ABNORMAL HIGH (ref 0.61–1.24)
GFR, Estimated: 21 mL/min — ABNORMAL LOW (ref >60)
Glucose, Bld: 245 mg/dL — ABNORMAL HIGH (ref 70–99)
Potassium: 3.8 mmol/L (ref 3.5–5.1)
Sodium: 137 mmol/L (ref 135–145)

## 2021-11-19 LAB — PROTIME-INR
INR: 3.2 — ABNORMAL HIGH (ref 0.8–1.2)
Prothrombin Time: 33.1 seconds — ABNORMAL HIGH (ref 11.4–15.2)

## 2021-11-19 MED ORDER — INSULIN GLARGINE-YFGN 100 UNIT/ML ~~LOC~~ SOLN
40.0000 [IU] | Freq: Every day | SUBCUTANEOUS | Status: DC
Start: 2021-11-19 — End: 2021-11-23
  Administered 2021-11-19 – 2021-11-22 (×4): 40 [IU] via SUBCUTANEOUS
  Filled 2021-11-19 (×6): qty 0.4

## 2021-11-19 MED ORDER — WARFARIN SODIUM 5 MG PO TABS
5.0000 mg | ORAL_TABLET | Freq: Once | ORAL | Status: AC
  Administered 2021-11-19: 5 mg via ORAL
  Filled 2021-11-19: qty 1

## 2021-11-19 MED ORDER — METOLAZONE 2.5 MG PO TABS
2.5000 mg | ORAL_TABLET | Freq: Once | ORAL | Status: AC
  Administered 2021-11-19: 2.5 mg via ORAL
  Filled 2021-11-19: qty 1

## 2021-11-19 NOTE — Progress Notes (Signed)
Advanced Heart Failure Rounding Note   Subjective:    Diuresing well on IV lasix and milrinone. Got metolazone yesterday. Out 5.5L Weight down 9 pounds in 2 days.   Co-ox 68%  CVP 9-10   Feels better. Denies CP, SOB, orthopnea or PND.   Scr 3.46 -> 3.20 -> 3.1  Echo 11/17/21 EF 20-25% RV severely HK. Mod AS/AI ? Low flow severe AS mean grad 18  DI 0.36   Objective:   Weight Range:  Vital Signs:   Temp:  [97.3 F (36.3 C)-97.8 F (36.6 C)] 97.6 F (36.4 C) (03/12 0727) Pulse Rate:  [82-89] 85 (03/12 0727) Resp:  [17-19] 19 (03/12 0727) BP: (95-108)/(54-63) 108/63 (03/12 0727) SpO2:  [95 %-98 %] 97 % (03/12 0727) Weight:  [99.1 kg] 99.1 kg (03/12 0335) Last BM Date : 11/16/21  Weight change: Filed Weights   11/17/21 1214 11/18/21 0413 11/19/21 0335  Weight: 103 kg 101.6 kg 99.1 kg    Intake/Output:   Intake/Output Summary (Last 24 hours) at 11/19/2021 1226 Last data filed at 11/19/2021 1202 Gross per 24 hour  Intake 1334.7 ml  Output 6360 ml  Net -5025.3 ml      Physical Exam: General:  Sitting up in bed No resp difficulty HEENT: normal Neck: supple. JVP 9-10. Carotids 2+ bilat; no bruits. No lymphadenopathy or thryomegaly appreciated. Cor: PMI nondisplaced. Irregular rate & rhythm. Mech s1 Lungs: clear Abdomen: soft, nontender, nondistended. No hepatosplenomegaly. No bruits or masses. Good bowel sounds. Extremities: no cyanosis, clubbing, rash, 2-3+ edema Neuro: alert & orientedx3, cranial nerves grossly intact. moves all 4 extremities w/o difficulty. Affect pleasant  Telemetry: AF 80-90 Personally reviewed  Labs: Basic Metabolic Panel: Recent Labs  Lab 11/17/21 1155 11/18/21 0424 11/19/21 0404  NA 140 138 137  K 4.4 3.8 3.8  CL 105 104 103  CO2 '22 23 26  '$ GLUCOSE 188* 172* 245*  BUN 81* 78* 71*  CREATININE 3.46* 3.20* 3.15*  CALCIUM 8.9 8.7* 8.8*  MG 1.8  --   --      Liver Function Tests: Recent Labs  Lab 11/17/21 1155  AST 56*   ALT 52*  ALKPHOS 143*  BILITOT 1.5*  PROT 6.0*  ALBUMIN 3.1*    No results for input(s): LIPASE, AMYLASE in the last 168 hours. No results for input(s): AMMONIA in the last 168 hours.  CBC: Recent Labs  Lab 11/17/21 1155  WBC 5.1  NEUTROABS 3.7  HGB 13.1  HCT 41.4  MCV 81.2  PLT 158     Cardiac Enzymes: No results for input(s): CKTOTAL, CKMB, CKMBINDEX, TROPONINI in the last 168 hours.  BNP: BNP (last 3 results) Recent Labs    11/17/21 1155  BNP >4,500.0*     ProBNP (last 3 results) No results for input(s): PROBNP in the last 8760 hours.    Other results:  Imaging: DG CHEST PORT 1 VIEW  Result Date: 11/17/2021 CLINICAL DATA:  PICC placement. EXAM: PORTABLE CHEST 1 VIEW COMPARISON:  June 20, 2020. FINDINGS: Stable cardiomegaly. Status post mitral valve repair. Status post coronary bypass graft. Single lead right-sided defibrillator is unchanged in position. There is been interval placement of left-sided PICC line with distal tip in expected position of the SVC. Minimal bibasilar subsegmental atelectasis is noted. Probable small left pleural effusion. Bony thorax is unremarkable. IMPRESSION: Left-sided PICC line is noted with distal tip in expected position of the SVC. Electronically Signed   By: Marijo Conception M.D.   On: 11/17/2021  17:13   ECHOCARDIOGRAM COMPLETE  Result Date: 11/17/2021    ECHOCARDIOGRAM REPORT   Patient Name:   John [name] Date of Exam: 11/17/2021 Medical Rec #:  [id number]        Height:       71.0 in Accession #:    [id number]       Weight:       227.0 lb Date of Birth:  [birth year]        BSA:          2.225 m Patient Age:    18 years         BP:           109/68 mmHg Patient Gender: M                HR:           69 bpm. Exam Location:  Inpatient Procedure: 2D Echo and Intracardiac Opacification Agent Indications:    congestive heart failure  History:        Patient has prior history of Echocardiogram examinations, most                  recent 12/23/2019. Cardiomyopathy and CHF, Defibrillator, Aortic                 Valve Disease, Arrythmias:Atrial Fibrillation; Risk                 Factors:Dyslipidemia, Diabetes and Hypertension.                  Mitral Valve: valve is present in the mitral position.  Sonographer:    Unknown Referring Phys: [id number] AMY D CLEGG IMPRESSIONS  1. Left ventricular ejection fraction, by estimation, is 20 to 25%. The left ventricle has severely decreased function. The left ventricle demonstrates global hypokinesis. The left ventricular internal cavity size was moderately dilated. Left ventricular diastolic parameters are indeterminate. No LV thrombus.  2. Right ventricular systolic function is severely reduced. The right ventricular size is normal. There is severely elevated pulmonary artery systolic pressure. The estimated right ventricular systolic pressure is 88.5 mmHg.  3. Left atrial size was severely dilated.  4. Right atrial size was mildly dilated.  5. Moderate pleural effusion in the left lateral region.  6. The mitral valve has been replaced by a mechanical bileafelt valve. No evidence of mitral valve regurgitation, no PVL.  7. Tricuspid valve regurgitation is moderate to severe. There is systolic flow reversal of the hepatic vein.  8. The aortic valve is calcified. Aortic valve regurgitation is moderate. Moderate aortic valve stenosis. Aortic valve mean gradient measures 15.0 mmHg. There is a component of low flow low gradient aortic stenosis.  9. The inferior vena cava is dilated in size with >50% respiratory variability, suggesting right atrial pressure of 8 mmHg. Comparison(s): RV function is worse from prior. FINDINGS  Left Ventricle: Left ventricular ejection fraction, by estimation, is 20 to 25%. The left ventricle has severely decreased function. The left ventricle demonstrates global hypokinesis. Definity contrast agent was given IV to delineate the left ventricular endocardial borders. The left  ventricular internal cavity size was moderately dilated. There is no left ventricular hypertrophy. Left ventricular diastolic parameters are indeterminate. Right Ventricle: The right ventricular size is normal. No increase in right ventricular wall thickness. Right ventricular systolic function is severely reduced. There is severely elevated pulmonary artery systolic pressure. The tricuspid regurgitant velocity is 3.61 m/s, and with an assumed right atrial pressure of  8 mmHg, the estimated right ventricular systolic pressure is 16.1 mmHg. Left Atrium: Left atrial size was severely dilated. Right Atrium: Right atrial size was mildly dilated. Pericardium: Trivial pericardial effusion is present. Mitral Valve: The mitral valve has been repaired/replaced. No evidence of mitral valve regurgitation. There is a present in the mitral position. MV peak gradient, 10.5 mmHg. The mean mitral valve gradient is 5.7 mmHg. Tricuspid Valve: The tricuspid valve is normal in structure. Tricuspid valve regurgitation is moderate to severe. No evidence of tricuspid stenosis. Aortic Valve: The aortic valve is calcified. Aortic valve regurgitation is moderate. Aortic regurgitation PHT measures 257 msec. Moderate aortic stenosis is present. Aortic valve mean gradient measures 15.0 mmHg. Aortic valve peak gradient measures 18.0 mmHg. Aortic valve area, by VTI measures 1.41 cm. Pulmonic Valve: The pulmonic valve was normal in structure. Pulmonic valve regurgitation is not visualized. No evidence of pulmonic stenosis. Aorta: The aortic root and ascending aorta are structurally normal, with no evidence of dilitation. Venous: The inferior vena cava is dilated in size with greater than 50% respiratory variability, suggesting right atrial pressure of 8 mmHg. IAS/Shunts: No atrial level shunt detected by color flow Doppler. Additional Comments: A device lead is visualized. There is a moderate pleural effusion in the left lateral region.  LEFT  VENTRICLE PLAX 2D LVIDd:         6.50 cm LVIDs:         6.20 cm LV PW:         0.90 cm LV IVS:        1.00 cm LVOT diam:     2.30 cm LV SV:         59 LV SV Index:   27 LVOT Area:     4.15 cm  RIGHT VENTRICLE         IVC TAPSE (M-mode): 0.9 cm  IVC diam: 2.30 cm LEFT ATRIUM              Index        RIGHT ATRIUM           Index LA diam:        6.30 cm  2.83 cm/m   RA Area:     24.00 cm LA Vol (A2C):   148.0 ml 66.51 ml/m  RA Volume:   81.00 ml  36.40 ml/m LA Vol (A4C):   131.0 ml 58.87 ml/m LA Biplane Vol: 147.0 ml 66.06 ml/m  AORTIC VALVE AV Area (Vmax):    1.24 cm AV Area (Vmean):   1.28 cm AV Area (VTI):     1.41 cm AV Vmax:           212.20 cm/s AV Vmean:          142.200 cm/s AV VTI:            0.419 m AV Peak Grad:      18.0 mmHg AV Mean Grad:      15.0 mmHg LVOT Vmax:         63.48 cm/s LVOT Vmean:        43.825 cm/s LVOT VTI:          0.142 m LVOT/AV VTI ratio: 0.34 AI PHT:            257 msec  AORTA Ao Root diam: 3.40 cm Ao Asc diam:  3.40 cm MITRAL VALVE              TRICUSPID VALVE MV Area VTI:  1.38 cm  TR Peak grad:   52.1 mmHg MV Peak grad: 10.5 mmHg   TR Vmax:        361.00 cm/s MV Mean grad: 5.7 mmHg MV Vmax:      1.62 m/s    SHUNTS MV Vmean:     113.3 cm/s  Systemic VTI:  0.14 m                           Systemic Diam: 2.30 cm Rudean Haskell MD Electronically signed by Rudean Haskell MD Signature Date/Time: 11/17/2021/5:22:00 PM    Final    [place] EKG SITE RITE  Result Date: 11/17/2021 If Site Rite image not attached, placement could not be confirmed due to current cardiac rhythm.    Medications:     Scheduled Medications:  Chlorhexidine Gluconate Cloth  6 each Topical Daily   clopidogrel  75 mg Oral Q breakfast   furosemide  80 mg Intravenous BID   insulin aspart  0-15 Units Subcutaneous TID WC   insulin aspart  0-5 Units Subcutaneous QHS   metoprolol succinate  50 mg Oral Daily   pantoprazole  40 mg Oral Daily   rosuvastatin  20 mg Oral Daily   sodium  chloride flush  10-40 mL Intracatheter Q12H   sodium chloride flush  3 mL Intravenous Q12H   warfarin  5 mg Oral ONCE-1600   Warfarin - Pharmacist Dosing Inpatient   Does not apply q1600    Infusions:  sodium chloride     milrinone 0.25 mcg/kg/min (11/19/21 0835)    PRN Medications: sodium chloride, acetaminophen, calcium carbonate, diphenhydrAMINE, ondansetron (ZOFRAN) IV, sodium chloride flush, sodium chloride flush   Assessment/Plan:   1. Acute on chronic systolic CHF: Last echo in 4/21 with EF 25-30%, severe LV dilation, normal RV, mild AS and AI, normal mechanical MV.  St Jude ICD. Suspect ischemic cardiomyopathy.  He is markedly volume overloaded on exam.  Creatinine is 2.5, baseline appears to be around 2.1.  Initial co-ox 55% -> 68% with milrinone. Diuresing well. Weight down 9 pounds in 2 days. CVP 9 but still volume overloaded.   - Continue Lasix 80 mg IV bid and metoalzone 2.5 today -  Echo 11/17/21 EF 20-25% RV severely HK. Mod AS/AI ? Low flow severe AS mean grad 18  DI 0.36. MVR ok  - Decreased Toprol XL to 100 mg daily.  - Hold Entresto with elevated creatinine.  - Eventually would use dapagliflozin (was on canagliflozin at home).  - Place TED hose 2. CAD: s/p CABG with MVR in 2002.  Last cath 10/21 with occluded SVG-RCA, and 80% mRCA, totally occluded LAD, patent LIMA-LAD, 85% ostial LCx treated with DES.  No chest pain, doubt ACS.  - Continue Plavix.  - Continue Crestor.  3. Atrial fibrillation: Permanent.  - Rate controlled - Warfarin per pharmacy.  4. 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   - MVR stable on echo  - INR 3.2 (goal 2.5-3.5). Dosing per pharmacy  5. AKI on CKD stage 3: Creatinine up to 2.5 recently , base around 2.1.  Suspect cardiorenal.  - SCr improved with milrinone 3.46 -> 3.20 -> 3.15 - Continue milrinone  6. Possible low gradient/low flow AS -  Echo 11/17/21 EF 20-25% RV severely HK. Mod AS/AI ? Low flow severe AS mean grad 18  DI 0.36. MVR  ok  - Consider TEE to further evaluate  7. Transaminitis. mild - likely due to passive congestion  -  diurese  Length of Stay: 2   Glori Bickers MD 11/19/2021, 12:26 PM  Advanced Heart Failure Team Pager [phone] (M-F; [place])  Please contact [facility] for night-coverage after hours (4p -7a ) and weekends on [email or web address]

## 2021-11-19 NOTE — Progress Notes (Signed)
ANTICOAGULATION CONSULT NOTE - Initial Consult ? ?Pharmacy Consult for warfarin ?Indication: Mechanical MVR ? ?Allergies  ?Allergen Reactions  ? Allopurinol Shortness Of Breath  ? Doxycycline Hives  ? Wilder Glade [Dapagliflozin]   ?  Can not tolerate - dizzy weak almost passed out  ? ? ?Patient Measurements: ?Height: '5\' 11"'$  (180.3 cm) ?Weight: 99.1 kg (218 lb 8 oz) ?IBW/kg (Calculated) : 75.3 ? ?Vital Signs: ?Temp: 97.6 ?F (36.4 ?C) (03/12 7062) ?Temp Source: Oral (03/12 0727) ?BP: 108/63 (03/12 0727) ?Pulse Rate: 85 (03/12 0727) ? ?Labs: ?Recent Labs  ?  11/17/21 ?1155 11/18/21 ?0424 11/19/21 ?0404  ?HGB 13.1  --   --   ?HCT 41.4  --   --   ?PLT 158  --   --   ?LABPROT 29.1* 30.2* 33.1*  ?INR 2.8* 2.9* 3.2*  ?CREATININE 3.46* 3.20* 3.15*  ?TROPONINIHS 53*  --   --   ? ? ?Estimated Creatinine Clearance: 26.9 mL/min (A) (by C-G formula based on SCr of 3.15 mg/dL (H)). ? ? ?Medical History: ?Past Medical History:  ?Diagnosis Date  ? Abnormal nuclear stress test 05/10/2017  ? Allergy   ? Anemia   ? "lost blood w/the cancer"  ? Arthritis   ? back   ? Atrial fibrillation ([place])   ? takes Coumadin daily  ? Bruises easily   ? takes Coumadin daily  ? CAD (coronary artery disease)   ? a. s/p CABG 11/2000 (L-LAD, S-RCA);  b. Lexiscan Myoview 11/12: EF 45%, ischemia and possibly some scar at the base of the inferolateral wall;   c. Lex MV 11/13:  EF 44%, Inf and IL scar/soft tissue atten, small mild amt of inf ischemia,  ? CHF (congestive heart failure) ([place])   ? Constipation   ? takes Colace daily  ? 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 Enlarged prostate   ? Flu 09/2013  ? GERD (gastroesophageal reflux disease)   ? takes Nexium daily  ? Glaucoma   ? History of blood transfusion   ? "related to cancer"  ? History of colon polyps   ? HLD (hyperlipidemia)   ? takes Vytorin daily  ? HTN (hypertension)   ? takes Amlodipine,Metoprolol, and Ramipril daily  ? Insomnia   ? states he has always been like this.But doesn't take any  meds  ? Kidney stones   ? "I passed them all"  ? Nocturia   ? Peripheral edema   ? takes Furosemide daily  ? Peripheral neuropathy   ? Rheumatic fever ~ 1965  ? Rheumatic heart disease   ? a. s/p mechanical (St. Jude) MVR 2002;  b. Echo 5/11: Mild LVH, EF 45-50%, mild AS, mild AI, mean gradient 11 mmHg, MVR with normal gradients, moderate LAE, mild RAE;  c.  Echo 11/13:   mod LVH, EF 55-60%, mild AS (mean 18 mmHg), mild AI, severe LAE, mild RAE, PASP 41  ? 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 SOB (shortness of breath)   ? with exertion  ? Type II diabetes mellitus ([place])   ? takes Januvia,Glipizide,and Metformin daily as well as Lantus  ? Unstable angina ([place]) 06/21/2020  ? ? ?Medications:  ?Medications Prior to Admission  ?Medication Sig Dispense Refill Last Dose  ? clopidogrel (PLAVIX) 75 MG tablet TAKE 1 TABLET BY MOUTH DAILY WITH BREAKFAST. 90 tablet 3 11/16/2021  ? furosemide (LASIX) 40 MG tablet Take 1 tablet (40 mg total) by mouth daily. 90 tablet 2  11/16/2021  ? glipiZIDE (GLUCOTROL XL) 10 MG 24 hr tablet Take 1 tablet (10 mg total) by mouth daily. 90 tablet 3 11/16/2021  ? insulin glargine (LANTUS) 100 UNIT/ML injection Inject 0.7 mLs (70 Units total) into the skin at bedtime. (Patient taking differently: Inject 70 Units into the skin. Take 70 units if over 150 daily) 10 mL 11 Past Month  ? isosorbide mononitrate (IMDUR) 30 MG 24 hr tablet Take 1 tablet (30 mg total) by mouth daily. 90 tablet 3 11/16/2021  ? JANUVIA 50 MG tablet Take 1 tablet (50 mg total) by mouth daily. 90 tablet 2 11/16/2021  ? metFORMIN (GLUCOPHAGE) 500 MG tablet Take 1 tablet (500 mg total) by mouth daily with breakfast. 90 tablet 3 11/16/2021  ? metoprolol (TOPROL-XL) 200 MG 24 hr tablet TAKE 1 TABLET (200 MG TOTAL) BY MOUTH DAILY AFTER BREAKFAST. 90 tablet 0 11/16/2021  ? OneTouch Delica Lancets 76A MISC TEST 2 TIMES DAILY AS DIRECTED 100 each 5 11/16/2021  ? ONETOUCH VERIO test strip USE TO TEST TWICE A DAY  200 strip 3 11/16/2021  ? pantoprazole (PROTONIX) 40 MG tablet Take 1 tablet (40 mg total) by mouth daily. 90 tablet 3 11/16/2021  ? rosuvastatin (CRESTOR) 20 MG tablet TAKE 1 TABLET BY MOUTH EVERY DAY 90 tablet 3 11/16/2021  ? sacubitril-valsartan (ENTRESTO) 24-26 MG Take 1 tablet by mouth 2 (two) times daily. 180 tablet 3 11/16/2021  ? sodium bicarbonate 650 MG tablet Take 650 mg by mouth 2 (two) times daily.   11/16/2021  ? warfarin (COUMADIN) 5 MG tablet Take 1 tablet (5 mg total) by mouth daily. (Patient taking differently: Take 5 mg by mouth daily. Alternates 2.5 x2 days then '5mg'$  alternating) 90 tablet 3 11/16/2021  ? colchicine 0.6 MG tablet Take twice daily for gout attack. (may take every two hours up to 6 doses at acute onset) (Patient not taking: Reported on 11/17/2021) 60 tablet 2 Not Taking  ? nitroGLYCERIN (NITROSTAT) 0.4 MG SL tablet Place 1 tablet (0.4 mg total) under the tongue every 5 (five) minutes as needed for chest pain. (Patient not taking: Reported on 11/17/2021) 50 tablet 10 Not Taking  ? ?Scheduled:  ? Chlorhexidine Gluconate Cloth  6 each Topical Daily  ? clopidogrel  75 mg Oral Q breakfast  ? furosemide  80 mg Intravenous BID  ? insulin aspart  0-15 Units Subcutaneous TID WC  ? insulin aspart  0-5 Units Subcutaneous QHS  ? metoprolol succinate  50 mg Oral Daily  ? pantoprazole  40 mg Oral Daily  ? rosuvastatin  20 mg Oral Daily  ? sodium chloride flush  10-40 mL Intracatheter Q12H  ? sodium chloride flush  3 mL Intravenous Q12H  ? Warfarin - Pharmacist Dosing Inpatient   Does not apply [id number]  ? ? ?Assessment: ?70 you male here with HF exacerbation. He is on warfarin PTA for a mechanical AVS and pharmacy consulted to dose warfarin.  ? ?PTA warfarin regimen: 5 mg/day x5 days, then 2.5 x2 days then repeat. Verified with patient on 3/10.  ? ?INR 3.2 therapeutic today. Received 5 mg yesterday - D2/5 of the 5 mg doses. CBC stable. ? ?Goal of Therapy:  ?INR= 2.5-3.5 ?Monitor platelets by anticoagulation  protocol: Yes ?  ?Plan:  ?-Warfarin '5mg'$  x1 today  ?-Daily PT/INR ? ?Laurey Arrow, PharmD ?PGY1 Pharmacy Resident ?11/19/2021  8:02 AM ? ?Please check [email or web address] for unit-specific pharmacy phone numbers. ? ?

## 2021-11-20 ENCOUNTER — Ambulatory Visit: Payer: PPO | Admitting: Family Medicine

## 2021-11-20 DIAGNOSIS — I5023 Acute on chronic systolic (congestive) heart failure: Secondary | ICD-10-CM | POA: Diagnosis not present

## 2021-11-20 LAB — BASIC METABOLIC PANEL
Anion gap: 9 (ref 5–15)
BUN: 66 mg/dL — ABNORMAL HIGH (ref 8–23)
CO2: 31 mmol/L (ref 22–32)
Calcium: 9.7 mg/dL (ref 8.9–10.3)
Chloride: 99 mmol/L (ref 98–111)
Creatinine, Ser: 3.17 mg/dL — ABNORMAL HIGH (ref 0.61–1.24)
GFR, Estimated: 21 mL/min — ABNORMAL LOW (ref >60)
Glucose, Bld: 149 mg/dL — ABNORMAL HIGH (ref 70–99)
Potassium: 3.7 mmol/L (ref 3.5–5.1)
Sodium: 139 mmol/L (ref 135–145)

## 2021-11-20 LAB — GLUCOSE, CAPILLARY
Glucose-Capillary: 104 mg/dL — ABNORMAL HIGH (ref 70–99)
Glucose-Capillary: 153 mg/dL — ABNORMAL HIGH (ref 70–99)
Glucose-Capillary: 253 mg/dL — ABNORMAL HIGH (ref 70–99)
Glucose-Capillary: 231 mg/dL — ABNORMAL HIGH (ref 70–99)

## 2021-11-20 LAB — PROTIME-INR
INR: 3.9 — ABNORMAL HIGH (ref 0.8–1.2)
Prothrombin Time: 38.3 seconds — ABNORMAL HIGH (ref 11.4–15.2)

## 2021-11-20 LAB — COOXEMETRY PANEL
Carboxyhemoglobin: <0.3 % — ABNORMAL LOW (ref 0.5–1.5)
Carboxyhemoglobin: 0.8 % (ref 0.5–1.5)
Methemoglobin: <0.7 % (ref 0.0–1.5)
Methemoglobin: <0.7 % (ref 0.0–1.5)
O2 Saturation: 53.3 %
O2 Saturation: 71.3 %
Total hemoglobin: 12.9 g/dL (ref 12.0–16.0)
Total hemoglobin: 12.5 g/dL (ref 12.0–16.0)

## 2021-11-20 MED ORDER — TRANEXAMIC ACID FOR EPISTAXIS
500.0000 mg | Freq: Once | TOPICAL | Status: AC | PRN
  Administered 2021-11-20: 500 mg via TOPICAL
  Filled 2021-11-20: qty 10

## 2021-11-20 MED ORDER — WARFARIN SODIUM 1 MG PO TABS
1.0000 mg | ORAL_TABLET | Freq: Once | ORAL | Status: DC
  Filled 2021-11-20: qty 1

## 2021-11-20 NOTE — Care Management Important Message (Signed)
Important Message ? ?Patient Details  ?Name: John [name] ?MRN: [id number] ?Date of Birth: 1951/04/28 ? ? ?Medicare Important Message Given:  Yes ? ? ? ? ?[name] ?11/20/2021, 11:12 AM ?

## 2021-11-20 NOTE — Progress Notes (Signed)
Patient ID: John [name], male   DOB: August 25, 1951, 71 y.o.   MRN: [id number] ? ? ? ?Advanced Heart Failure Rounding Note ? ? ?Subjective:   ? ?Diuresing well on IV lasix and milrinone. Got metolazone yesterday. Weight down again.  Today, CVP 6-7.  Early morning co-ox low at 53%.  ? ?Scr 3.46 -> 3.20 -> 3.15 -> 3.17 ? ?Overall, feeling better.  Breathing better when he walks in the hall.  ? ?Echo 11/17/21: EF 20-25%, RV severely HK, mechanical MV with mean gradient 6, mod-severe TR, moderate AI, ?low flow/low gradient moderate-severe AS with mean gradient 15/AVA 0.96 cm^2.  ? ? ?Objective:   ?Weight Range: ? ?Vital Signs:   ?Temp:  [97.3 ?F (36.3 ?C)-98.2 ?F (36.8 ?C)] 97.5 ?F (36.4 ?C) (03/13 8676) ?Pulse Rate:  [92-94] 92 (03/13 0814) ?Resp:  [17-19] 18 (03/13 7209) ?BP: (96-111)/(52-63) 109/55 (03/13 4709) ?SpO2:  [93 %-98 %] 93 % (03/13 0814) ?Weight:  [94.7 kg] 94.7 kg (03/13 0300) ?Last BM Date : 11/16/21 ? ?Weight change: ?Filed Weights  ? 11/18/21 0413 11/19/21 0335 11/20/21 0300  ?Weight: 101.6 kg 99.1 kg 94.7 kg  ? ? ?Intake/Output:  ? ?Intake/Output Summary (Last 24 hours) at 11/20/2021 1002 ?Last data filed at 11/20/2021 6283 ?Gross per 24 hour  ?Intake 1367.1 ml  ?Output 5425 ml  ?Net -4057.9 ml  ?  ? ?Physical Exam: ?General: NAD ?Neck: No JVD, no thyromegaly or thyroid nodule.  ?Lungs: Clear to auscultation bilaterally with normal respiratory effort. ?CV: Nondisplaced PMI.  Heart irregular S1/S2 with mechanical S1, no S3/S4, 3/6 SEM RUSB.  1+ ankle edema.  ?Abdomen: Soft, nontender, no hepatosplenomegaly, no distention.  ?Skin: Intact without lesions or rashes.  ?Neurologic: Alert and oriented x 3.  ?Psych: Normal affect. ?Extremities: No clubbing or cyanosis.  ?HEENT: Normal.  ? ? ?Telemetry: AF 90s Personally reviewed ? ?Labs: ?Basic Metabolic Panel: ?Recent Labs  ?Lab 11/17/21 ?1155 11/18/21 ?0424 11/19/21 ?0404 11/20/21 ?0428  ?NA 140 138 137 139  ?K 4.4 3.8 3.8 3.7  ?CL 105 104 103 99  ?CO2 '22 23 26  31  '$ ?GLUCOSE 188* 172* 245* 149*  ?BUN 81* 78* 71* 66*  ?CREATININE 3.46* 3.20* 3.15* 3.17*  ?CALCIUM 8.9 8.7* 8.8* 9.7  ?MG 1.8  --   --   --   ? ? ?Liver Function Tests: ?Recent Labs  ?Lab 11/17/21 ?1155  ?AST 56*  ?ALT 52*  ?ALKPHOS 143*  ?BILITOT 1.5*  ?PROT 6.0*  ?ALBUMIN 3.1*  ? ?No results for input(s): LIPASE, AMYLASE in the last 168 hours. ?No results for input(s): AMMONIA in the last 168 hours. ? ?CBC: ?Recent Labs  ?Lab 11/17/21 ?1155  ?WBC 5.1  ?NEUTROABS 3.7  ?HGB 13.1  ?HCT 41.4  ?MCV 81.2  ?PLT 158  ? ? ?Cardiac Enzymes: ?No results for input(s): CKTOTAL, CKMB, CKMBINDEX, TROPONINI in the last 168 hours. ? ?BNP: ?BNP (last 3 results) ?Recent Labs  ?  11/17/21 ?1155  ?BNP >4,500.0*  ? ? ?ProBNP (last 3 results) ?No results for input(s): PROBNP in the last 8760 hours. ? ? ? ?Other results: ? ?Imaging: ?No results found. ? ? ?Medications:   ? ? ?Scheduled Medications: ? Chlorhexidine Gluconate Cloth  6 each Topical Daily  ? clopidogrel  75 mg Oral Q breakfast  ? insulin aspart  0-15 Units Subcutaneous TID WC  ? insulin aspart  0-5 Units Subcutaneous QHS  ? insulin glargine-yfgn  40 Units Subcutaneous QHS  ? metoprolol succinate  50 mg Oral  Daily  ? pantoprazole  40 mg Oral Daily  ? rosuvastatin  20 mg Oral Daily  ? sodium chloride flush  10-40 mL Intracatheter Q12H  ? sodium chloride flush  3 mL Intravenous Q12H  ? Warfarin - Pharmacist Dosing Inpatient   Does not apply [id number]  ? ? ?Infusions: ? sodium chloride    ? milrinone 0.25 mcg/kg/min (11/19/21 2215)  ? ? ?PRN Medications: ?sodium chloride, acetaminophen, calcium carbonate, diphenhydrAMINE, ondansetron (ZOFRAN) IV, sodium chloride flush, sodium chloride flush ? ? ?Assessment/Plan:  ? ?1. Acute on chronic systolic CHF: Last echo in 4/21 with EF 25-30%, severe LV dilation, normal RV, mild AS and AI, normal mechanical MV.  St Jude ICD. Suspect ischemic cardiomyopathy.  He is markedly volume overloaded on exam.  AKI at admission, baseline creatinine  around 2.1.  Echo this admission with EF 20-25%, RV severely HK, mechanical MV with mean gradient 6, mod-severe TR, moderate AI, ?low flow/low gradient moderate-severe AS with mean gradient 15/AVA 0.96 cm^2.  He has diuresed well on milrinone 0.25 + Lasix/metolazone.  Weight continues to drop.  CVP down to 6-7.  Early am co-ox low at 53%. Creatinine 3.17, stable compared to yesterday.  ?- Hold Lasix today, restart po diuretic likely tomorrow.   ?- Continue Toprol XL 50 mg daily.  ?- Other GDMT limited by AKI on CKD stage 3.  ?- Continue milrinone 0.25 today, send co-ox again.  Will wean slowly over next couple of days (would like to see renal function trend down).  ?2. CAD: s/p CABG with MVR in 2002.  Last cath 10/21 with occluded SVG-RCA, and 80% mRCA, totally occluded LAD, patent LIMA-LAD, 85% ostial LCx treated with DES.  No chest pain, doubt ACS.  ?- Continue Plavix.  ?- Continue Crestor.  ?3. Atrial fibrillation: Permanent. Reasonable rate control. ?- Continue Toprol XL.  ?- Warfarin per pharmacy.  ?4. 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  INR 3.9 (goal 2.5-3.5).  ?- Warfarin dosing per pharmacy  ?5. AKI on CKD stage 3: Creatinine up to 3.46 at admission, baseline around 2.1.  Suspect cardiorenal.  Creatinine now 3.15, stable. CVP 6-7.  ?- Stop IV Lasix.  ?- Continue milrinone with slow wean.  ?6. 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 Discussed risks/benefits with patient, he agrees to procedure.  ?7. Transaminitis. Mild, likely due to passive congestion  ?- diurese ? ?Mobilize.  ? ?Length of Stay: 3 ? ? ?Loralie Champagne MD ?11/20/2021, 10:02 AM ? ?Advanced Heart Failure Team ?Pager [phone] (M-F; 7a - 4p)  ?Please contact [facility] for night-coverage after hours (4p -7a ) and weekends on [email or web address] ? ?

## 2021-11-20 NOTE — Progress Notes (Signed)
ANTICOAGULATION CONSULT NOTE ? ?Pharmacy Consult for warfarin ?Indication: Mechanical MVR ? ?Allergies  ?Allergen Reactions  ? Allopurinol Shortness Of Breath  ? Doxycycline Hives  ? Wilder Glade [Dapagliflozin]   ?  Can not tolerate - dizzy weak almost passed out  ? ? ?Patient Measurements: ?Height: '5\' 11"'$  (180.3 cm) ?Weight: 94.7 kg (208 lb 11.2 oz) ?IBW/kg (Calculated) : 75.3 ? ?Vital Signs: ?Temp: 97.5 ?F (36.4 ?C) (03/13 7517) ?Temp Source: Oral (03/13 0017) ?BP: 109/55 (03/13 4944) ?Pulse Rate: 92 (03/13 0814) ? ?Labs: ?Recent Labs  ?  11/18/21 ?0424 11/19/21 ?0404 11/20/21 ?0428  ?LABPROT 30.2* 33.1* 38.3*  ?INR 2.9* 3.2* 3.9*  ?CREATININE 3.20* 3.15* 3.17*  ? ? ?Estimated Creatinine Clearance: 26.2 mL/min (A) (by C-G formula based on SCr of 3.17 mg/dL (H)). ? ? ?Medical History: ?Past Medical History:  ?Diagnosis Date  ? Abnormal nuclear stress test 05/10/2017  ? Allergy   ? Anemia   ? "lost blood w/the cancer"  ? Arthritis   ? back   ? Atrial fibrillation ([place])   ? takes Coumadin daily  ? Bruises easily   ? takes Coumadin daily  ? CAD (coronary artery disease)   ? a. s/p CABG 11/2000 (L-LAD, S-RCA);  b. Lexiscan Myoview 11/12: EF 45%, ischemia and possibly some scar at the base of the inferolateral wall;   c. Lex MV 11/13:  EF 44%, Inf and IL scar/soft tissue atten, small mild amt of inf ischemia,  ? CHF (congestive heart failure) ([place])   ? Constipation   ? takes Colace daily  ? 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 Enlarged prostate   ? Flu 09/2013  ? GERD (gastroesophageal reflux disease)   ? takes Nexium daily  ? Glaucoma   ? History of blood transfusion   ? "related to cancer"  ? History of colon polyps   ? HLD (hyperlipidemia)   ? takes Vytorin daily  ? HTN (hypertension)   ? takes Amlodipine,Metoprolol, and Ramipril daily  ? Insomnia   ? states he has always been like this.But doesn't take any meds  ? Kidney stones   ? "I passed them all"  ? Nocturia   ? Peripheral edema   ? takes Furosemide daily  ?  Peripheral neuropathy   ? Rheumatic fever ~ 1965  ? Rheumatic heart disease   ? a. s/p mechanical (St. Jude) MVR 2002;  b. Echo 5/11: Mild LVH, EF 45-50%, mild AS, mild AI, mean gradient 11 mmHg, MVR with normal gradients, moderate LAE, mild RAE;  c.  Echo 11/13:   mod LVH, EF 55-60%, mild AS (mean 18 mmHg), mild AI, severe LAE, mild RAE, PASP 41  ? 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 SOB (shortness of breath)   ? with exertion  ? Type II diabetes mellitus ([place])   ? takes Januvia,Glipizide,and Metformin daily as well as Lantus  ? Unstable angina ([place]) 06/21/2020  ? ? ?Medications:  ?Medications Prior to Admission  ?Medication Sig Dispense Refill Last Dose  ? clopidogrel (PLAVIX) 75 MG tablet TAKE 1 TABLET BY MOUTH DAILY WITH BREAKFAST. 90 tablet 3 11/16/2021  ? furosemide (LASIX) 40 MG tablet Take 1 tablet (40 mg total) by mouth daily. 90 tablet 2 11/16/2021  ? glipiZIDE (GLUCOTROL XL) 10 MG 24 hr tablet Take 1 tablet (10 mg total) by mouth daily. 90 tablet 3 11/16/2021  ? insulin glargine (LANTUS) 100 UNIT/ML injection Inject 0.7 mLs (70 Units total) into  the skin at bedtime. (Patient taking differently: Inject 70 Units into the skin. Take 70 units if over 150 daily) 10 mL 11 Past Month  ? isosorbide mononitrate (IMDUR) 30 MG 24 hr tablet Take 1 tablet (30 mg total) by mouth daily. 90 tablet 3 11/16/2021  ? JANUVIA 50 MG tablet Take 1 tablet (50 mg total) by mouth daily. 90 tablet 2 11/16/2021  ? metFORMIN (GLUCOPHAGE) 500 MG tablet Take 1 tablet (500 mg total) by mouth daily with breakfast. 90 tablet 3 11/16/2021  ? metoprolol (TOPROL-XL) 200 MG 24 hr tablet TAKE 1 TABLET (200 MG TOTAL) BY MOUTH DAILY AFTER BREAKFAST. 90 tablet 0 11/16/2021  ? OneTouch Delica Lancets 70Y MISC TEST 2 TIMES DAILY AS DIRECTED 100 each 5 11/16/2021  ? ONETOUCH VERIO test strip USE TO TEST TWICE A DAY 200 strip 3 11/16/2021  ? pantoprazole (PROTONIX) 40 MG tablet Take 1 tablet (40 mg total) by mouth daily. 90  tablet 3 11/16/2021  ? rosuvastatin (CRESTOR) 20 MG tablet TAKE 1 TABLET BY MOUTH EVERY DAY 90 tablet 3 11/16/2021  ? sacubitril-valsartan (ENTRESTO) 24-26 MG Take 1 tablet by mouth 2 (two) times daily. 180 tablet 3 11/16/2021  ? sodium bicarbonate 650 MG tablet Take 650 mg by mouth 2 (two) times daily.   11/16/2021  ? warfarin (COUMADIN) 5 MG tablet Take 1 tablet (5 mg total) by mouth daily. (Patient taking differently: Take 5 mg by mouth daily. Alternates 2.5 x2 days then '5mg'$  alternating) 90 tablet 3 11/16/2021  ? colchicine 0.6 MG tablet Take twice daily for gout attack. (may take every two hours up to 6 doses at acute onset) (Patient not taking: Reported on 11/17/2021) 60 tablet 2 Not Taking  ? nitroGLYCERIN (NITROSTAT) 0.4 MG SL tablet Place 1 tablet (0.4 mg total) under the tongue every 5 (five) minutes as needed for chest pain. (Patient not taking: Reported on 11/17/2021) 50 tablet 10 Not Taking  ? ?Scheduled:  ? Chlorhexidine Gluconate Cloth  6 each Topical Daily  ? clopidogrel  75 mg Oral Q breakfast  ? insulin aspart  0-15 Units Subcutaneous TID WC  ? insulin aspart  0-5 Units Subcutaneous QHS  ? insulin glargine-yfgn  40 Units Subcutaneous QHS  ? metoprolol succinate  50 mg Oral Daily  ? pantoprazole  40 mg Oral Daily  ? rosuvastatin  20 mg Oral Daily  ? sodium chloride flush  10-40 mL Intracatheter Q12H  ? sodium chloride flush  3 mL Intravenous Q12H  ? Warfarin - Pharmacist Dosing Inpatient   Does not apply [id number]  ? ? ?Assessment: ?36 you male here with HF exacerbation. He is on warfarin PTA for a mechanical AVS and pharmacy consulted to dose warfarin.  ? ?PTA warfarin regimen: 5 mg/day x5 days, then 2.5 x2 days then repeat. Verified with patient on 3/10.  ? ?INR is supratherapeutic at 3.9 today. No s/sx of bleeding. Oral intake documented at 100%.  ? ?Goal of Therapy:  ?INR 2.5-3.5 ?Monitor platelets by anticoagulation protocol: Yes ?  ?Plan:  ?-Warfarin 1 mg x1 today  ?-Daily PT/INR ? ?Antonietta Jewel, PharmD,  BCCCP ?Clinical Pharmacist  ?Phone: [phone] ?11/20/2021 3:44 PM ? ?Please check AMION for all [place] phone numbers ?After 10:00 PM, call [facility] [phone] ? ? ?

## 2021-11-20 NOTE — Progress Notes (Signed)
? ?  Pt with epistaxis - with elevated INR coumadin held - pressure applied.    ? ?Cecilie Kicks, FNP-C ?At [facility]  ?[phone] or after 5pm and on weekends call [phone] ?11/20/2021.   ?

## 2021-11-20 NOTE — Progress Notes (Signed)
Pt  began having epistaxis at 1750.  Spoke with pharmacist and [place] PA regarding warfarin administration and ok to hold at this time. ? ?Pt currently  have small amount epistaxis. Pt stated that he blew his nose a bit too hard.  ?

## 2021-11-20 NOTE — H&P (View-Only) (Signed)
Patient ID: John [name], male   DOB: [birth year], 71 y.o.   MRN: [id number] ? ? ? ?Advanced Heart Failure Rounding Note ? ? ?Subjective:   ? ?Diuresing well on IV lasix and milrinone. Got metolazone yesterday. Weight down again.  Today, CVP 6-7.  Early morning co-ox low at 53%.  ? ?Scr 3.46 -> 3.20 -> 3.15 -> 3.17 ? ?Overall, feeling better.  Breathing better when he walks in the hall.  ? ?Echo 11/17/21: EF 20-25%, RV severely HK, mechanical MV with mean gradient 6, mod-severe TR, moderate AI, ?low flow/low gradient moderate-severe AS with mean gradient 15/AVA 0.96 cm^2.  ? ? ?Objective:   ?Weight Range: ? ?Vital Signs:   ?Temp:  [97.3 ?F (36.3 ?C)-98.2 ?F (36.8 ?C)] 97.5 ?F (36.4 ?C) (03/13 6734) ?Pulse Rate:  [92-94] 92 (03/13 0814) ?Resp:  [17-19] 18 (03/13 1937) ?BP: (96-111)/(52-63) 109/55 (03/13 9024) ?SpO2:  [93 %-98 %] 93 % (03/13 0814) ?Weight:  [94.7 kg] 94.7 kg (03/13 0300) ?Last BM Date : 11/16/21 ? ?Weight change: ?Filed Weights  ? 11/18/21 0413 11/19/21 0335 11/20/21 0300  ?Weight: 101.6 kg 99.1 kg 94.7 kg  ? ? ?Intake/Output:  ? ?Intake/Output Summary (Last 24 hours) at 11/20/2021 1002 ?Last data filed at 11/20/2021 0973 ?Gross per 24 hour  ?Intake 1367.1 ml  ?Output 5425 ml  ?Net -4057.9 ml  ?  ? ?Physical Exam: ?General: NAD ?Neck: No JVD, no thyromegaly or thyroid nodule.  ?Lungs: Clear to auscultation bilaterally with normal respiratory effort. ?CV: Nondisplaced PMI.  Heart irregular S1/S2 with mechanical S1, no S3/S4, 3/6 SEM RUSB.  1+ ankle edema.  ?Abdomen: Soft, nontender, no hepatosplenomegaly, no distention.  ?Skin: Intact without lesions or rashes.  ?Neurologic: Alert and oriented x 3.  ?Psych: Normal affect. ?Extremities: No clubbing or cyanosis.  ?HEENT: Normal.  ? ? ?Telemetry: AF 90s Personally reviewed ? ?Labs: ?Basic Metabolic Panel: ?Recent Labs  ?Lab 11/17/21 ?1155 11/18/21 ?0424 11/19/21 ?0404 11/20/21 ?0428  ?NA 140 138 137 139  ?K 4.4 3.8 3.8 3.7  ?CL 105 104 103 99  ?CO2 '22 23 26  31  '$ ?GLUCOSE 188* 172* 245* 149*  ?BUN 81* 78* 71* 66*  ?CREATININE 3.46* 3.20* 3.15* 3.17*  ?CALCIUM 8.9 8.7* 8.8* 9.7  ?MG 1.8  --   --   --   ? ? ?Liver Function Tests: ?Recent Labs  ?Lab 11/17/21 ?1155  ?AST 56*  ?ALT 52*  ?ALKPHOS 143*  ?BILITOT 1.5*  ?PROT 6.0*  ?ALBUMIN 3.1*  ? ?No results for input(s): LIPASE, AMYLASE in the last 168 hours. ?No results for input(s): AMMONIA in the last 168 hours. ? ?CBC: ?Recent Labs  ?Lab 11/17/21 ?1155  ?WBC 5.1  ?NEUTROABS 3.7  ?HGB 13.1  ?HCT 41.4  ?MCV 81.2  ?PLT 158  ? ? ?Cardiac Enzymes: ?No results for input(s): CKTOTAL, CKMB, CKMBINDEX, TROPONINI in the last 168 hours. ? ?BNP: ?BNP (last 3 results) ?Recent Labs  ?  11/17/21 ?1155  ?BNP >4,500.0*  ? ? ?ProBNP (last 3 results) ?No results for input(s): PROBNP in the last 8760 hours. ? ? ? ?Other results: ? ?Imaging: ?No results found. ? ? ?Medications:   ? ? ?Scheduled Medications: ? Chlorhexidine Gluconate Cloth  6 each Topical Daily  ? clopidogrel  75 mg Oral Q breakfast  ? insulin aspart  0-15 Units Subcutaneous TID WC  ? insulin aspart  0-5 Units Subcutaneous QHS  ? insulin glargine-yfgn  40 Units Subcutaneous QHS  ? metoprolol succinate  50 mg Oral  Daily  ? pantoprazole  40 mg Oral Daily  ? rosuvastatin  20 mg Oral Daily  ? sodium chloride flush  10-40 mL Intracatheter Q12H  ? sodium chloride flush  3 mL Intravenous Q12H  ? Warfarin - Pharmacist Dosing Inpatient   Does not apply [id number]  ? ? ?Infusions: ? sodium chloride    ? milrinone 0.25 mcg/kg/min (11/19/21 2215)  ? ? ?PRN Medications: ?sodium chloride, acetaminophen, calcium carbonate, diphenhydrAMINE, ondansetron (ZOFRAN) IV, sodium chloride flush, sodium chloride flush ? ? ?Assessment/Plan:  ? ?1. Acute on chronic systolic CHF: Last echo in 4/21 with EF 25-30%, severe LV dilation, normal RV, mild AS and AI, normal mechanical MV.  St Jude ICD. Suspect ischemic cardiomyopathy.  He is markedly volume overloaded on exam.  AKI at admission, baseline creatinine  around 2.1.  Echo this admission with EF 20-25%, RV severely HK, mechanical MV with mean gradient 6, mod-severe TR, moderate AI, ?low flow/low gradient moderate-severe AS with mean gradient 15/AVA 0.96 cm^2.  He has diuresed well on milrinone 0.25 + Lasix/metolazone.  Weight continues to drop.  CVP down to 6-7.  Early am co-ox low at 53%. Creatinine 3.17, stable compared to yesterday.  ?- Hold Lasix today, restart po diuretic likely tomorrow.   ?- Continue Toprol XL 50 mg daily.  ?- Other GDMT limited by AKI on CKD stage 3.  ?- Continue milrinone 0.25 today, send co-ox again.  Will wean slowly over next couple of days (would like to see renal function trend down).  ?2. CAD: s/p CABG with MVR in 2002.  Last cath 10/21 with occluded SVG-RCA, and 80% mRCA, totally occluded LAD, patent LIMA-LAD, 85% ostial LCx treated with DES.  No chest pain, doubt ACS.  ?- Continue Plavix.  ?- Continue Crestor.  ?3. Atrial fibrillation: Permanent. Reasonable rate control. ?- Continue Toprol XL.  ?- Warfarin per pharmacy.  ?4. 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  INR 3.9 (goal 2.5-3.5).  ?- Warfarin dosing per pharmacy  ?5. AKI on CKD stage 3: Creatinine up to 3.46 at admission, baseline around 2.1.  Suspect cardiorenal.  Creatinine now 3.15, stable. CVP 6-7.  ?- Stop IV Lasix.  ?- Continue milrinone with slow wean.  ?6. 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 Discussed risks/benefits with patient, he agrees to procedure.  ?7. Transaminitis. Mild, likely due to passive congestion  ?- diurese ? ?Mobilize.  ? ?Length of Stay: 3 ? ? ?Loralie Champagne MD ?11/20/2021, 10:02 AM ? ?Advanced Heart Failure Team ?Pager [phone] (M-F; 7a - 4p)  ?Please contact [facility] for night-coverage after hours (4p -7a ) and weekends on [email or web address] ? ?

## 2021-11-20 NOTE — Progress Notes (Signed)
CARDIAC REHAB PHASE I  ? ?Went to offer to walk with pt. Pt asleep in the bed. Will f/u to ambulate as time allows. ? ?Rufina Falco, RN BSN ?11/20/2021 ?2:44 PM ? ?

## 2021-11-21 ENCOUNTER — Encounter (HOSPITAL_COMMUNITY)
Admission: AD | Disposition: A | Discharge status: Home / Self Care | Payer: Self-pay | Source: Ambulatory Visit | Attending: Cardiology

## 2021-11-21 ENCOUNTER — Encounter (HOSPITAL_COMMUNITY): Payer: Self-pay | Admitting: Cardiology

## 2021-11-21 ENCOUNTER — Inpatient Hospital Stay (HOSPITAL_COMMUNITY): Payer: PPO | Admitting: Anesthesiology

## 2021-11-21 ENCOUNTER — Inpatient Hospital Stay (HOSPITAL_COMMUNITY): Payer: PPO

## 2021-11-21 DIAGNOSIS — I350 Nonrheumatic aortic (valve) stenosis: Secondary | ICD-10-CM

## 2021-11-21 DIAGNOSIS — I110 Hypertensive heart disease with heart failure: Secondary | ICD-10-CM

## 2021-11-21 DIAGNOSIS — I352 Nonrheumatic aortic (valve) stenosis with insufficiency: Secondary | ICD-10-CM

## 2021-11-21 DIAGNOSIS — I5023 Acute on chronic systolic (congestive) heart failure: Secondary | ICD-10-CM | POA: Diagnosis not present

## 2021-11-21 DIAGNOSIS — I2510 Atherosclerotic heart disease of native coronary artery without angina pectoris: Secondary | ICD-10-CM

## 2021-11-21 DIAGNOSIS — I509 Heart failure, unspecified: Secondary | ICD-10-CM

## 2021-11-21 LAB — CBC
HCT: 37.6 % — ABNORMAL LOW (ref 39.0–52.0)
Hemoglobin: 12.1 g/dL — ABNORMAL LOW (ref 13.0–17.0)
MCH: 25.6 pg — ABNORMAL LOW (ref 26.0–34.0)
MCHC: 32.2 g/dL (ref 30.0–36.0)
MCV: 79.5 fL — ABNORMAL LOW (ref 80.0–100.0)
Platelets: 168 10*3/uL (ref 150–400)
RBC: 4.73 MIL/uL (ref 4.22–5.81)
RDW: 20.1 % — ABNORMAL HIGH (ref 11.5–15.5)
WBC: 5.9 10*3/uL (ref 4.0–10.5)
nRBC: 0 % (ref 0.0–0.2)

## 2021-11-21 LAB — COOXEMETRY PANEL
Carboxyhemoglobin: 1 % (ref 0.5–1.5)
Carboxyhemoglobin: 1.3 % (ref 0.5–1.5)
Methemoglobin: <0.7 % (ref 0.0–1.5)
Methemoglobin: <0.7 % (ref 0.0–1.5)
O2 Saturation: 52.5 %
O2 Saturation: 55 %
Total hemoglobin: 12.5 g/dL (ref 12.0–16.0)
Total hemoglobin: 12.7 g/dL (ref 12.0–16.0)

## 2021-11-21 LAB — BASIC METABOLIC PANEL
Anion gap: 12 (ref 5–15)
BUN: 78 mg/dL — ABNORMAL HIGH (ref 8–23)
CO2: 30 mmol/L (ref 22–32)
Calcium: 9.1 mg/dL (ref 8.9–10.3)
Chloride: 94 mmol/L — ABNORMAL LOW (ref 98–111)
Creatinine, Ser: 3.29 mg/dL — ABNORMAL HIGH (ref 0.61–1.24)
GFR, Estimated: 20 mL/min — ABNORMAL LOW (ref >60)
Glucose, Bld: 179 mg/dL — ABNORMAL HIGH (ref 70–99)
Potassium: 3.5 mmol/L (ref 3.5–5.1)
Sodium: 136 mmol/L (ref 135–145)

## 2021-11-21 LAB — GLUCOSE, CAPILLARY
Glucose-Capillary: 174 mg/dL — ABNORMAL HIGH (ref 70–99)
Glucose-Capillary: 276 mg/dL — ABNORMAL HIGH (ref 70–99)
Glucose-Capillary: 188 mg/dL — ABNORMAL HIGH (ref 70–99)
Glucose-Capillary: 261 mg/dL — ABNORMAL HIGH (ref 70–99)

## 2021-11-21 LAB — PROTIME-INR
INR: 4 — ABNORMAL HIGH (ref 0.8–1.2)
Prothrombin Time: 39.1 seconds — ABNORMAL HIGH (ref 11.4–15.2)

## 2021-11-21 SURGERY — ECHOCARDIOGRAM, TRANSESOPHAGEAL
Anesthesia: Monitor Anesthesia Care

## 2021-11-21 MED ORDER — PHENYLEPHRINE 40 MCG/ML (10ML) SYRINGE FOR IV PUSH (FOR BLOOD PRESSURE SUPPORT)
PREFILLED_SYRINGE | INTRAVENOUS | Status: DC | PRN
  Administered 2021-11-21 (×2): 240 ug via INTRAVENOUS
  Administered 2021-11-21: 80 ug via INTRAVENOUS
  Administered 2021-11-21: 160 ug via INTRAVENOUS

## 2021-11-21 MED ORDER — EPHEDRINE SULFATE-NACL 50-0.9 MG/10ML-% IV SOSY
PREFILLED_SYRINGE | INTRAVENOUS | Status: DC | PRN
  Administered 2021-11-21: 10 mg via INTRAVENOUS

## 2021-11-21 MED ORDER — PROPOFOL 10 MG/ML IV BOLUS
INTRAVENOUS | Status: DC | PRN
  Administered 2021-11-21 (×2): 30 mg via INTRAVENOUS

## 2021-11-21 MED ORDER — POTASSIUM CHLORIDE CRYS ER 20 MEQ PO TBCR
20.0000 meq | EXTENDED_RELEASE_TABLET | Freq: Once | ORAL | Status: AC
  Administered 2021-11-21: 20 meq via ORAL
  Filled 2021-11-21: qty 1

## 2021-11-21 MED ORDER — SODIUM CHLORIDE 0.9 % IV SOLN: INTRAVENOUS | Status: DC

## 2021-11-21 MED ORDER — PHENYLEPHRINE HCL-NACL 20-0.9 MG/250ML-% IV SOLN
INTRAVENOUS | Status: DC | PRN
  Administered 2021-11-21: 30 ug/min via INTRAVENOUS

## 2021-11-21 MED ORDER — PROPOFOL 500 MG/50ML IV EMUL
INTRAVENOUS | Status: DC | PRN
  Administered 2021-11-21: 75 ug/kg/min via INTRAVENOUS

## 2021-11-21 NOTE — Progress Notes (Signed)
Orthopedic Tech Progress Note ?Patient Details:  ?John [name] ?03/03/1951 ?[id number] ? Went to apply [employer] to patient with daughter at bedside, patient is taking a nap and was asked if we could come back with an 1.5 so patient could rest up. ? ?Patient ID: [name], male   DOB: March 01, 1951, 71 y.o.   MRN: [id number] ? ?Janit Pagan ?11/21/2021, 1:43 PM ? ?

## 2021-11-21 NOTE — Progress Notes (Signed)
Pt with epistaxis. Pressure held for about 45 mins. MD notified. Med ordered and given. Bleeding subsided. ?

## 2021-11-21 NOTE — Anesthesia Preprocedure Evaluation (Signed)
Anesthesia Evaluation  ?Patient identified by MRN, date of birth, ID band ?Patient awake ? ? ? ?Reviewed: ?Allergy & Precautions, H&P , NPO status , Patient's Chart, lab work & pertinent test results ? ?Airway ?Mallampati: I ? ?TM Distance: >3 FB ?Neck ROM: Full ? ? ? Dental ?no notable dental hx. ?(+) Teeth Intact, Dental Advisory Given ?  ?Pulmonary ?shortness of breath, former smoker,  ?  ?Pulmonary exam normal ?breath sounds clear to auscultation ? ? ? ? ? ? Cardiovascular ?hypertension, + CAD and +CHF  ?Normal cardiovascular exam+ dysrhythmias Atrial Fibrillation + Valvular Problems/Murmurs MR  ?Rhythm:Regular Rate:Normal ? ?Lexiscan Myoview 11/12: EF 45%, ischemia and possibly some scar at the base of the inferolateral wall;   c. Lex MV 11/13:  EF 44%, Inf and IL scar/soft tissue atten, small mild amt of inf ischemia, ?  ?Neuro/Psych ? Neuromuscular disease   ? GI/Hepatic ?GERD  ,  ?Endo/Other  ?diabetes, Type 2, Insulin Dependent, Oral Hypoglycemic AgentsMorbid obesity ? Renal/GU ?  ? ?  ?Musculoskeletal ? ?(+) Arthritis , Osteoarthritis,   ? Abdominal ?  ?Peds ? Hematology ? ?(+) Blood dyscrasia, anemia ,   ?Anesthesia Other Findings ? ? Reproductive/Obstetrics ? ?  ? ? ? ? ? ? ? ? ? ? ? ? ? ?  ?  ? ? ? ? ? ? ? ? ?Anesthesia Physical ? ?Anesthesia Plan ? ?ASA: III ? ?Anesthesia Plan: MAC  ? ?Post-op Pain Management:   ? ?Induction: Intravenous ? ?PONV Risk Score and Plan: 1 and Ondansetron and Treatment may vary due to age or medical condition ? ?Airway Management Planned: Nasal Cannula ? ?Additional Equipment:  ? ?Intra-op Plan:  ? ?Post-operative Plan:  ? ?Informed Consent: I have reviewed the patients History and Physical, chart, labs and discussed the procedure including the risks, benefits and alternatives for the proposed anesthesia with the patient or authorized representative who has indicated his/her understanding and acceptance.  ? ? ? ? ? ?Plan Discussed with:  CRNA, Anesthesiologist and Surgeon ? ?Anesthesia Plan Comments:   ? ? ? ? ? ? ?Anesthesia Quick Evaluation ? ?

## 2021-11-21 NOTE — Transfer of Care (Signed)
Immediate Anesthesia Transfer of Care Note ? ?Patient: John [name] ? ?Procedure(s) Performed: TRANSESOPHAGEAL ECHOCARDIOGRAM (TEE) ? ?Patient Location: PACU and Endoscopy Unit ? ?Anesthesia Type:MAC ? ?Level of Consciousness: drowsy ? ?Airway & Oxygen Therapy: Patient Spontanous Breathing ? ?Post-op Assessment: Report given to RN and Post -op Vital signs reviewed and stable ? ?Post vital signs: Reviewed and stable ? ?Last Vitals:  ?Vitals Value Taken Time  ?BP    ?Temp    ?Pulse 94 11/21/21 0828  ?Resp 17 11/21/21 0828  ?SpO2 93 % 11/21/21 0828  ?Vitals shown include unvalidated device data. ? ?Last Pain:  ?Vitals:  ? 11/21/21 0656  ?TempSrc: Temporal  ?PainSc: 0-No pain  ?   ? ?Patients Stated Pain Goal: 0 (11/19/21 1200) ? ?Complications: No notable events documented. ?

## 2021-11-21 NOTE — Progress Notes (Signed)
?  Echocardiogram ?Echocardiogram Transesophageal has been performed. ? Hassie Bruce ?11/21/2021, 9:32 AM ?

## 2021-11-21 NOTE — CV Procedure (Addendum)
Procedure: TEE ? ?Sedation: Per anesthesiology ? ?Indication: Aortic valve disorder ? ?Findings: Please see echo section for full report. Moderate LV dilation with EF 25%, diffuse hypokinesis. Mobile structure in the LV, suspect is the head of the posteromedial papillary muscle and not a thrombus.  Normal RV size with mildly decreased systolic function.  ICD in RV with trivial TR. Moderate LA enlargement, very small LA appendage (?partially clipped at surgery) with no thrombus noted.  Mild right atrial enlargement.  No PFO/ASD by color doppler. Mechanical mitral valve mean gradient 5 mmHg.  Valve opens well with minimal (expected) MR (normal function).  The aortic valve has heavily calcified and fixed noncoronary and right coronary cusps, appears functionally bicuspid.  Mean gradient 18 mmHg with AVA 1.2 cm^2 with dimensionless index 0.23.  Moderate-severe low flow/low gradient aortic stenosis.  Moderate-severe aortic insufficiency with dimensionless index 300 msec and holodiastolic flow reversal in the ascending thoracic aorta.  Normal caliber thoracic aorta. IVC small, suggesting CVP <5.  ? ?John [name] ?11/21/2021 ?8:28 AM ? ?

## 2021-11-21 NOTE — Progress Notes (Signed)
ANTICOAGULATION CONSULT NOTE ? ?Pharmacy Consult for warfarin ?Indication: Mechanical MVR ? ?Allergies  ?Allergen Reactions  ? Allopurinol Shortness Of Breath  ? Doxycycline Hives  ? Wilder Glade [Dapagliflozin]   ?  Can not tolerate - dizzy weak almost passed out  ? ? ?Patient Measurements: ?Height: '5\' 11"'$  (180.3 cm) ?Weight: 92.5 kg (203 lb 14.8 oz) ?IBW/kg (Calculated) : 75.3 ? ?Vital Signs: ?Temp: 97.7 ?F (36.5 ?C) (03/14 0913) ?Temp Source: Oral (03/14 0913) ?BP: 91/71 (03/14 0913) ?Pulse Rate: 97 (03/14 0845) ? ?Labs: ?Recent Labs  ?  11/19/21 ?0404 11/20/21 ?0428 11/21/21 ?0410  ?HGB  --   --  12.1*  ?HCT  --   --  37.6*  ?PLT  --   --  168  ?LABPROT 33.1* 38.3* 39.1*  ?INR 3.2* 3.9* 4.0*  ?CREATININE 3.15* 3.17* 3.29*  ? ? ?Estimated Creatinine Clearance: 25 mL/min (A) (by C-G formula based on SCr of 3.29 mg/dL (H)). ? ? ?Medical History: ?Past Medical History:  ?Diagnosis Date  ? Abnormal nuclear stress test 05/10/2017  ? Allergy   ? Anemia   ? "lost blood w/the cancer"  ? Arthritis   ? back   ? Atrial fibrillation ([place])   ? takes Coumadin daily  ? Bruises easily   ? takes Coumadin daily  ? CAD (coronary artery disease)   ? a. s/p CABG 11/2000 (L-LAD, S-RCA);  b. Lexiscan Myoview 11/12: EF 45%, ischemia and possibly some scar at the base of the inferolateral wall;   c. Lex MV 11/13:  EF 44%, Inf and IL scar/soft tissue atten, small mild amt of inf ischemia,  ? CHF (congestive heart failure) ([place])   ? Constipation   ? takes Colace daily  ? 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 Enlarged prostate   ? Flu 09/2013  ? GERD (gastroesophageal reflux disease)   ? takes Nexium daily  ? Glaucoma   ? History of blood transfusion   ? "related to cancer"  ? History of colon polyps   ? HLD (hyperlipidemia)   ? takes Vytorin daily  ? HTN (hypertension)   ? takes Amlodipine,Metoprolol, and Ramipril daily  ? Insomnia   ? states he has always been like this.But doesn't take any meds  ? Kidney stones   ? "I passed them all"   ? Nocturia   ? Peripheral edema   ? takes Furosemide daily  ? Peripheral neuropathy   ? Rheumatic fever ~ 1965  ? Rheumatic heart disease   ? a. s/p mechanical (St. Jude) MVR 2002;  b. Echo 5/11: Mild LVH, EF 45-50%, mild AS, mild AI, mean gradient 11 mmHg, MVR with normal gradients, moderate LAE, mild RAE;  c.  Echo 11/13:   mod LVH, EF 55-60%, mild AS (mean 18 mmHg), mild AI, severe LAE, mild RAE, PASP 41  ? 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 SOB (shortness of breath)   ? with exertion  ? Type II diabetes mellitus ([place])   ? takes Januvia,Glipizide,and Metformin daily as well as Lantus  ? Unstable angina ([place]) 06/21/2020  ? ? ?Medications:  ?Medications Prior to Admission  ?Medication Sig Dispense Refill Last Dose  ? clopidogrel (PLAVIX) 75 MG tablet TAKE 1 TABLET BY MOUTH DAILY WITH BREAKFAST. 90 tablet 3 11/16/2021  ? furosemide (LASIX) 40 MG tablet Take 1 tablet (40 mg total) by mouth daily. 90 tablet 2 11/16/2021  ? glipiZIDE (GLUCOTROL XL) 10 MG 24 hr tablet Take  1 tablet (10 mg total) by mouth daily. 90 tablet 3 11/16/2021  ? insulin glargine (LANTUS) 100 UNIT/ML injection Inject 0.7 mLs (70 Units total) into the skin at bedtime. (Patient taking differently: Inject 70 Units into the skin. Take 70 units if over 150 daily) 10 mL 11 Past Month  ? isosorbide mononitrate (IMDUR) 30 MG 24 hr tablet Take 1 tablet (30 mg total) by mouth daily. 90 tablet 3 11/16/2021  ? JANUVIA 50 MG tablet Take 1 tablet (50 mg total) by mouth daily. 90 tablet 2 11/16/2021  ? metFORMIN (GLUCOPHAGE) 500 MG tablet Take 1 tablet (500 mg total) by mouth daily with breakfast. 90 tablet 3 11/16/2021  ? metoprolol (TOPROL-XL) 200 MG 24 hr tablet TAKE 1 TABLET (200 MG TOTAL) BY MOUTH DAILY AFTER BREAKFAST. 90 tablet 0 11/16/2021  ? OneTouch Delica Lancets 67Y MISC TEST 2 TIMES DAILY AS DIRECTED 100 each 5 11/16/2021  ? ONETOUCH VERIO test strip USE TO TEST TWICE A DAY 200 strip 3 11/16/2021  ? pantoprazole (PROTONIX)  40 MG tablet Take 1 tablet (40 mg total) by mouth daily. 90 tablet 3 11/16/2021  ? rosuvastatin (CRESTOR) 20 MG tablet TAKE 1 TABLET BY MOUTH EVERY DAY 90 tablet 3 11/16/2021  ? sacubitril-valsartan (ENTRESTO) 24-26 MG Take 1 tablet by mouth 2 (two) times daily. 180 tablet 3 11/16/2021  ? sodium bicarbonate 650 MG tablet Take 650 mg by mouth 2 (two) times daily.   11/16/2021  ? warfarin (COUMADIN) 5 MG tablet Take 1 tablet (5 mg total) by mouth daily. (Patient taking differently: Take 5 mg by mouth daily. Alternates 2.5 x2 days then '5mg'$  alternating) 90 tablet 3 11/16/2021  ? colchicine 0.6 MG tablet Take twice daily for gout attack. (may take every two hours up to 6 doses at acute onset) (Patient not taking: Reported on 11/17/2021) 60 tablet 2 Not Taking  ? nitroGLYCERIN (NITROSTAT) 0.4 MG SL tablet Place 1 tablet (0.4 mg total) under the tongue every 5 (five) minutes as needed for chest pain. (Patient not taking: Reported on 11/17/2021) 50 tablet 10 Not Taking  ? ?Scheduled:  ? Chlorhexidine Gluconate Cloth  6 each Topical Daily  ? clopidogrel  75 mg Oral Q breakfast  ? insulin aspart  0-15 Units Subcutaneous TID WC  ? insulin aspart  0-5 Units Subcutaneous QHS  ? insulin glargine-yfgn  40 Units Subcutaneous QHS  ? metoprolol succinate  50 mg Oral Daily  ? pantoprazole  40 mg Oral Daily  ? rosuvastatin  20 mg Oral Daily  ? sodium chloride flush  10-40 mL Intracatheter Q12H  ? sodium chloride flush  3 mL Intravenous Q12H  ? warfarin  1 mg Oral ONCE-1600  ? Warfarin - Pharmacist Dosing Inpatient   Does not apply [id number]  ? ? ?Assessment: ?56 you male here with HF exacerbation. He is on warfarin PTA for a mechanical AVS and pharmacy consulted to dose warfarin.  ? ?PTA warfarin regimen: 5 mg/day x5 days, then 2.5 x2 days then repeat. Verified with patient on 3/10.  ? ?INR is supratherapeutic at 4 today. Had significant nose bleed yesterday - no warfarin given. Hgb 12.1, plt 168. Oral intake documented at 100% yesterday.  ? ?Goal of  Therapy:  ?INR 2.5-3.5 ?Monitor platelets by anticoagulation protocol: Yes ?  ?Plan:  ?-Hold warfarin tonight given multiple episodes of epistaxis to allow INR to trend lower ?-Daily PT/INR ? ?Antonietta Jewel, PharmD, BCCCP ?Clinical Pharmacist  ?Phone: [phone] ?11/21/2021 9:56 AM ? ?Please check AMION for  all [facility] Pharm[facility] phone numbers ?After 10:00 PM, call [place] [phone] ? ? ?

## 2021-11-21 NOTE — Interval H&P Note (Signed)
History and Physical Interval Note: ? ?11/21/2021 ?7:55 AM ? ?John [name]  has presented today for surgery, with the diagnosis of aortic stenosis.  The various methods of treatment have been discussed with the patient and family. After consideration of risks, benefits and other options for treatment, the patient has consented to  Procedure(s): ?TRANSESOPHAGEAL ECHOCARDIOGRAM (TEE) (N/A) as a surgical intervention.  The patient's history has been reviewed, patient examined, no change in status, stable for surgery.  I have reviewed the patient's chart and labs.  Questions were answered to the patient's satisfaction.   ? ? ?[name] [name] ? ? ?

## 2021-11-21 NOTE — Consult Note (Addendum)
?HE[facility] AND VASCULAR CENTER   ?MULTIDISCIPLINARY HEART VALVE TEAM ? ?Cardiology Consultation:  ? ?Patient ID: John [name] ?MRN: [id number]; DOB: May 20, 1951 ? ?Admit date: 11/17/2021 ?Date of Consult: 11/21/2021 ? ?Primary Care Provider: Claretta Fraise, MD ?[facility] Cardiologist: John Breeding, MD  ?[facility] Electrophysiologist:  John Peru, MD  ? ? ?Patient Profile:  ? ?John [name] is a 71 y.o. male with a hx of rheumatic heart disease s/p mechanical mitral valve replacement and CAD s/p CABG with L-LAD and S-RCA (2002) with subsequent PCI, chronic combined S/D CHF, ICM s/p St Jude ICD, persistent atrial fibrillation on coumadin, DMT2, HTN, HLD, CKD stage IV and RBBB who is being seen today for the evaluation of severe LFLG AS at the request of John [name]. ? ?History of Present Illness:  ? ?John [name] lives in [place], [place] with his wife.  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  He still drives and can walk without the aid of a assistance device.  He runs errands and mows the yard.  He is also the primary cook in their household.  ? ?He underwent mitral valve replacement with a mechanical mitral valve and coronary artery revascularization in 2002 by John [name] as detailed above.  He has been on Coumadin since that time. ? ?He has a complex medical history as outlined above. He had a Lexiscan Myoview in 2018 which showed a reduced EF with a new anterior wall perfusion defect that was not present on the previous perfusion study. Subsequent cath showed an occluded SVG to the RCA but flow through the native vessel.  The LAD was occluded but with good flow through the LIMA.  ? ?The patient was admitted for unstable angina in October 2021. Cardiac catheterization performed on 06/23/2020 revealed 85% ostial left circumflex lesion, 100% mid LAD occlusion, 30% proximal RCA lesion, 80% proximal to distal RCA lesion, 100% occlusion in SVG to  distal RCA, patent LIMA to LAD, 70% proximal to mid LAD lesion. The left circumflex lesion was treated with a Resolute Onyx 2.75 x 26 mm DES on the following day with staged PCI.  During this admission he had acute kidney injury with a creatinine up to 4.75.  This improved to 2.44 at discharge. ? ?His last echocardiogram in 12/2019 demonstrated EF 25 to 30%, normally functioning  mechanical mitral valve and mild aortic stenosis. ? ?He was added on to John [name] office clinic on 11/17/2021 for evaluation of worsening weakness, fatigue, weight gain, shortness of breath as well as worsening kidney function and INR.  John [name] believed he was in low output heart failure and he was directly admitted to John [name] under the advanced heart failure service. ? ?Labs on admission showed a creatinine of 3.2, GFR 20, AST 56/ALT 52, alk phos 143, albumin 3.1, BNP over 4500, at bedtime troponin 53, hemoglobin 13.1, platelets 158, INR 4.4.  Respiratory panel negative, HIV nonreactive.  ECG with atrial fibrillation with right bundle branch block. ? ?A PICC line was placed and he was started on milrinone and IV Lasix. Echo 11/17/21 showed EF 20-25%, RV severely HK, mechanical MV with mean gradient 6, mod-severe TR, moderate AI, and possible low flow/low gradient moderate-severe AS with mean gradient 15 AVA  1.4 cm^2, DVI 0.34, SVI 27 and moderate AI with PHT 257 ms.  ? ?TEE 11/21/21 showed EF around 25% but only mildly decreased RV function and trivial TR,  normally functioning mechanical MV and abnormal aortic valve with moderate-severe AS and AI. The aortic valve has heavily calcified and fixed noncoronary and right coronary cusps, appears functionally bicuspid.  Mean gradient 18 mmHg with AVA 1.2 cm^2 with dimensionless index 0.23.  Moderate-severe low flow/low gradient aortic stenosis. Moderate-severe aortic insufficiency with dimensionless index 300 msec and holodiastolic flow reversal in the ascending thoracic  aorta. ?  ?He has diuresed well on milrinone 0.25 + Lasix/metolazone. Weight down (227--> 203lbs) and CVP down to 5.  Early am co-ox low at 53%, same as yesterday and when repeated was 71. Creatinine mildly higher at 3.29. Holding Lasix today with plans to restart po diuretic when CVP starts to rise and creatinine comes down. GDMT has been limited by CKD and hypotension.  ? ?Structural heart team is consulted to see for consideration of TAVR.  He is feeling much better after IV diuresis.  He is currently about to take a walk with his wife.  He has been limited in his ability to do any of his normal activities over the past 3 weeks due to lower extremity edema, orthopnea, PND, abdominal distention and severe dyspnea on exertion.  Despite not being able to eat very much his abdomen became more distended and his weight continues to climb. He denies chest pain.  He denies dizziness or syncope. ? ?Past Medical History:  ?Diagnosis Date  ? Abnormal nuclear stress test 05/10/2017  ? Allergy   ? Anemia   ? "lost blood w/the cancer"  ? Arthritis   ? back   ? Atrial fibrillation ([place])   ? takes Coumadin daily  ? Bruises easily   ? takes Coumadin daily  ? CAD (coronary artery disease)   ? a. s/p CABG 11/2000 (L-LAD, S-RCA);  b. Lexiscan Myoview 11/12: EF 45%, ischemia and possibly some scar at the base of the inferolateral wall;   c. Lex MV 11/13:  EF 44%, Inf and IL scar/soft tissue atten, small mild amt of inf ischemia,  ? CHF (congestive heart failure) ([place])   ? Constipation   ? takes Colace daily  ? 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 Enlarged prostate   ? Flu 09/2013  ? GERD (gastroesophageal reflux disease)   ? takes Nexium daily  ? Glaucoma   ? History of blood transfusion   ? "related to cancer"  ? History of colon polyps   ? HLD (hyperlipidemia)   ? takes Vytorin daily  ? HTN (hypertension)   ? takes Amlodipine,Metoprolol, and Ramipril daily  ? Insomnia   ? states he has always been like this.But doesn't take  any meds  ? Kidney stones   ? "I passed them all"  ? Nocturia   ? Peripheral edema   ? takes Furosemide daily  ? Peripheral neuropathy   ? Rheumatic fever ~ 1965  ? Rheumatic heart disease   ? a. s/p mechanical (St. Jude) MVR 2002;  b. Echo 5/11: Mild LVH, EF 45-50%, mild AS, mild AI, mean gradient 11 mmHg, MVR with normal gradients, moderate LAE, mild RAE;  c.  Echo 11/13:   mod LVH, EF 55-60%, mild AS (mean 18 mmHg), mild AI, severe LAE, mild RAE, PASP 41  ? 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 SOB (shortness of breath)   ? with exertion  ? Type II diabetes mellitus ([place])   ? takes Januvia,Glipizide,and Metformin daily as well as Lantus  ? Unstable angina ([place]) 06/21/2020  ? ? ?  Past Surgical History:  ?Procedure Laterality Date  ? APPLICATION OF WOUND VAC  2014  ? CARDIAC CATHETERIZATION  2002  ? COLONOSCOPY    ? CORONARY ARTERY BYPASS GRAFT  2002  ? x 3  ? CORONARY ARTERY BYPASS GRAFT    ? CORONARY ATHERECTOMY N/A 06/24/2020  ? Procedure: CORONARY ATHERECTOMY;  Surgeon: [name], MD;  Location: [facility];  Service: Cardiovascular;  Laterality: N/A;  ? CORONARY BALLOON ANGIOPLASTY N/A 06/24/2020  ? Procedure: CORONARY BALLOON ANGIOPLASTY;  Surgeon: [name], MD;  Location: [facility];  Service: Cardiovascular;  Laterality: N/A;  ? CORONARY STENT INTERVENTION N/A 06/24/2020  ? Procedure: CORONARY STENT INTERVENTION;  Surgeon: [name], MD;  Location: [facility];  Service: Cardiovascular;  Laterality: N/A;  ? ESOPHAGOGASTRODUODENOSCOPY    ? HERNIA REPAIR    ? ICD IMPLANT N/A 02/19/2020  ? Procedure: ICD IMPLANT;  Surgeon: Evans Lance, MD;  Location: [facility];  Service: Cardiovascular;  Laterality: N/A;  ? INCISIONAL HERNIA REPAIR N/A 03/15/2014  ? Procedure: LAPAROSCOPIC INCISIONAL HERNIA;  Surgeon: Harl Bowie, MD;  Location: [facility];  Service: General;  Laterality: N/A;  ? INSERTION OF MESH N/A 03/15/2014  ?  Procedure: INSERTION OF MESH;  Surgeon: Harl Bowie, MD;  Location: [facility];  Service: General;  Laterality: N/A;  ? LAPAROSCOPIC INCISIONAL / UMBILICAL / [place]  03/15/2014  ? IHR  ? LAPARO

## 2021-11-21 NOTE — Progress Notes (Signed)
Patient ID: John [name], male   DOB: 11-Dec-1950, 71 y.o.   MRN: [id number] ? ? ? ?Advanced Heart Failure Rounding Note ? ? ?Subjective:   ? ?Weight down 1 lb, stopped Lasix yesterday.  Remains on milrinone 0.25.  Today, CVP 5.  Early morning co-ox low at 53% (71% yesterday).  ? ?Scr 3.46 -> 3.20 -> 3.15 -> 3.17 -> 3.29 ? ?Overall, feeling better.  Breathing better when he walks in the hall.  Had epistaxis last night, INR high still at 4.  ? ?Echo 11/17/21: EF 20-25%, RV severely HK, mechanical MV with mean gradient 6, mod-severe TR, moderate AI, ?low flow/low gradient moderate-severe AS with mean gradient 15/AVA 0.96 cm^2.  ? ?TEE: ?Moderate LV dilation with EF 25%, diffuse hypokinesis.  Normal RV size with mildly decreased systolic function.  ICD in RV with trivial TR. Moderate LA enlargement, very small LA appendage (?partially clipped at surgery) with no thrombus noted.  Mild right atrial enlargement.  No PFO/ASD by color doppler. Mechanical mitral valve mean gradient 5 mmHg.  Valve opens well with minimal (expected) MR (normal function).  The aortic valve has heavily calcified and fixed noncoronary and right coronary cusps, appears functionally bicuspid.  Mean gradient 18 mmHg with AVA 1.2 cm^2 with dimensionless index 0.23.  Moderate-severe low flow/low gradient aortic stenosis.  Moderate-severe aortic insufficiency with dimensionless index 300 msec and holodiastolic flow reversal in the ascending thoracic aorta.  Normal caliber thoracic aorta. IVC small, suggesting CVP <5.  ? ? ?Objective:   ?Weight Range: ? ?Vital Signs:   ?Temp:  [97.8 ?F (36.6 ?C)-98.6 ?F (37 ?C)] 97.9 ?F (36.6 ?C) (03/14 4709) ?Pulse Rate:  [86-111] 94 (03/14 0829) ?Resp:  [17-18] 17 (03/14 0829) ?BP: (96-113)/(38-59) 96/39 (03/14 6283) ?SpO2:  [92 %-96 %] 93 % (03/14 0829) ?Weight:  [92.5 kg] 92.5 kg (03/14 0656) ?Last BM Date : 11/16/21 ? ?Weight change: ?Filed Weights  ? 11/20/21 0300 11/21/21 0318 11/21/21 0656  ?Weight: 94.7 kg 92.5  kg 92.5 kg  ? ? ?Intake/Output:  ? ?Intake/Output Summary (Last 24 hours) at 11/21/2021 0831 ?Last data filed at 11/21/2021 6629 ?Gross per 24 hour  ?Intake 860 ml  ?Output 2150 ml  ?Net -1290 ml  ?  ? ?Physical Exam: ?General: NAD ?Neck: No JVD, no thyromegaly or thyroid nodule.  ?Lungs: Clear to auscultation bilaterally with normal respiratory effort. ?CV: Nondisplaced PMI.  Heart irregular S1/S2 with mechanical S1, no S3/S4,2/6 SEM RUSB.  No peripheral edema.  ?Abdomen: Soft, nontender, no hepatosplenomegaly, no distention.  ?Skin: Intact without lesions or rashes.  ?Neurologic: Alert and oriented x 3.  ?Psych: Normal affect. ?Extremities: No clubbing or cyanosis.  ?HEENT: Normal.  ? ? ? ?Telemetry: AF 90s Personally reviewed ? ?Labs: ?Basic Metabolic Panel: ?Recent Labs  ?Lab 11/17/21 ?1155 11/18/21 ?0424 11/19/21 ?0404 11/20/21 ?0428 11/21/21 ?0410  ?NA 140 138 137 139 136  ?K 4.4 3.8 3.8 3.7 3.5  ?CL 105 104 103 99 94*  ?CO2 '22 23 26 31 30  '$ ?GLUCOSE 188* 172* 245* 149* 179*  ?BUN 81* 78* 71* 66* 78*  ?CREATININE 3.46* 3.20* 3.15* 3.17* 3.29*  ?CALCIUM 8.9 8.7* 8.8* 9.7 9.1  ?MG 1.8  --   --   --   --   ? ? ?Liver Function Tests: ?Recent Labs  ?Lab 11/17/21 ?1155  ?AST 56*  ?ALT 52*  ?ALKPHOS 143*  ?BILITOT 1.5*  ?PROT 6.0*  ?ALBUMIN 3.1*  ? ?No results for input(s): LIPASE, AMYLASE in the last 168  hours. ?No results for input(s): AMMONIA in the last 168 hours. ? ?CBC: ?Recent Labs  ?Lab 11/17/21 ?1155 11/21/21 ?0410  ?WBC 5.1 5.9  ?NEUTROABS 3.7  --   ?HGB 13.1 12.1*  ?HCT 41.4 37.6*  ?MCV 81.2 79.5*  ?PLT 158 168  ? ? ?Cardiac Enzymes: ?No results for input(s): CKTOTAL, CKMB, CKMBINDEX, TROPONINI in the last 168 hours. ? ?BNP: ?BNP (last 3 results) ?Recent Labs  ?  11/17/21 ?1155  ?BNP >4,500.0*  ? ? ?ProBNP (last 3 results) ?No results for input(s): PROBNP in the last 8760 hours. ? ? ? ?Other results: ? ?Imaging: ?No results found. ? ? ?Medications:   ? ? ?Scheduled Medications: ? [MAR Hold] Chlorhexidine  Gluconate Cloth  6 each Topical Daily  ? [MAR Hold] clopidogrel  75 mg Oral Q breakfast  ? [MAR Hold] insulin aspart  0-15 Units Subcutaneous TID WC  ? [MAR Hold] insulin aspart  0-5 Units Subcutaneous QHS  ? [MAR Hold] insulin glargine-yfgn  40 Units Subcutaneous QHS  ? [MAR Hold] metoprolol succinate  50 mg Oral Daily  ? [MAR Hold] pantoprazole  40 mg Oral Daily  ? [MAR Hold] rosuvastatin  20 mg Oral Daily  ? [MAR Hold] sodium chloride flush  10-40 mL Intracatheter Q12H  ? [MAR Hold] sodium chloride flush  3 mL Intravenous Q12H  ? [MAR Hold] warfarin  1 mg Oral ONCE-1600  ? [MAR Hold] Warfarin - Pharmacist Dosing Inpatient   Does not apply q1600  ? ? ?Infusions: ? [MAR Hold] sodium chloride    ? sodium chloride    ? milrinone 0.25 mcg/kg/min (11/21/21 0743)  ? ? ?PRN Medications: ?[MAR Hold] sodium chloride, [MAR Hold] acetaminophen, [MAR Hold] calcium carbonate, [MAR Hold] diphenhydrAMINE, [MAR Hold] ondansetron (ZOFRAN) IV, [MAR Hold] sodium chloride flush, [MAR Hold] sodium chloride flush ? ? ?Assessment/Plan:  ? ?1. Acute on chronic systolic CHF: Last echo in 4/21 with EF 25-30%, severe LV dilation, normal RV, mild AS and AI, normal mechanical MV.  St Jude ICD. Suspect ischemic cardiomyopathy.  He is markedly volume overloaded on exam.  AKI at admission, baseline creatinine around 2.1.  Echo this admission with EF 20-25%, RV severely HK, mechanical MV with mean gradient 6, mod-severe TR, moderate AI, ?low flow/low gradient moderate-severe AS with mean gradient 15/AVA 0.96 cm^2.  TEE today showed EF around 25% but only mildly decreased RV function and trivial TR, normally functioning mechanical MV and abnormal aortic valve with moderate-severe AS and AI. He has diuresed well on milrinone 0.25 + Lasix/metolazone.  Weight down and CVP down to 5.  Early am co-ox low at 53%, same as yesterday and when repeated was 71%. Creatinine mildly higher at 3.29.  ?- Hold Lasix today, restart po diuretic when CVP starts to  rise and creatinine comes down.   ?- Continue Toprol XL 50 mg daily.  ?- Other GDMT limited by AKI on CKD stage 3.  ?- Continue milrinone 0.25 today, send co-ox again.  Will wean slowly over next couple of days (would like to see renal function trend down).  ?- RHC once milrinone weaned down.  ?2. CAD: s/p CABG with MVR in 2002.  Last cath 10/21 with occluded SVG-RCA, and 80% mRCA, totally occluded LAD, patent LIMA-LAD, 85% ostial LCx treated with DES.  No chest pain, doubt ACS.  ?- Continue Plavix (?transition ASA in future as >1 year post-PCI).  ?- Continue Crestor.  ?3. Atrial fibrillation: Permanent. Reasonable rate control. ?- Continue Toprol XL.  ?- Warfarin  per pharmacy (hold for now with high INR and epistaxis).  ?4. 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  INR 4 (goal 2.5-3.5).  ?- Warfarin dosing per pharmacy  ?5. AKI on CKD stage 3: Creatinine up to 3.46 at admission, baseline around 2.1.  Suspect cardiorenal.  Creatinine now 3.15 => 3.29. CVP 5.  ?- Hold diuretic for now. ?- Continue milrinone with slow wean.  ?6. Aortic valve disorder: Abnormal aortic valve, looks functionally bicuspid with fixed noncoronary and right coronary cusps.  Moderate-severe AS with AVA 1.2 cm^2 but DI 0.23.  There is moderate-severe AI as well.   ?- Will review with structural heart team.  ?Candidate for TAVR in future (renal function will be an issue however).  ?7. Transaminitis. Mild, likely due to passive congestion  ?- diurese ? ?Mobilize.  ? ?Length of Stay: 4 ? ? ?Loralie Champagne MD ?11/21/2021, 8:31 AM ? ?Advanced Heart Failure Team ?Pager [phone] (M-F; 7a - 4p)  ?Please contact [facility] for night-coverage after hours (4p -7a ) and weekends on [email or web address] ? ?

## 2021-11-21 NOTE — Progress Notes (Signed)
Orthopedic Tech Progress Note ?Patient Details:  ?John [name] ?1951-01-06 ?[id number] ? ?Ortho Devices ?Type of Ortho Device: Unna boot ?Ortho Device/Splint Location: BLE ?Ortho Device/Splint Interventions: Ordered, Application ?  ?Post Interventions ?Patient Tolerated: Well ?Instructions Provided: Care of device ? ?Janit Pagan ?11/21/2021, 2:50 PM ? ?

## 2021-11-21 NOTE — Anesthesia Postprocedure Evaluation (Signed)
Anesthesia Post Note ? ?Patient: John [name] ? ?Procedure(s) Performed: TRANSESOPHAGEAL ECHOCARDIOGRAM (TEE) ? ?  ? ?Patient location during evaluation: PACU ?Anesthesia Type: MAC ?Level of consciousness: awake and alert ?Pain management: pain level controlled ?Vital Signs Assessment: post-procedure vital signs reviewed and stable ?Respiratory status: spontaneous breathing, nonlabored ventilation and respiratory function stable ?Cardiovascular status: blood pressure returned to baseline and stable ?Postop Assessment: no apparent nausea or vomiting ?Anesthetic complications: no ? ? ?No notable events documented. ? ?Last Vitals:  ?Vitals:  ? 11/21/21 0845 11/21/21 0913  ?BP: (!) 102/46 91/71  ?Pulse: 97   ?Resp: 14   ?Temp:  36.5 ?C  ?SpO2: 96% 96%  ?  ?Last Pain:  ?Vitals:  ? 11/21/21 0913  ?TempSrc: Oral  ?PainSc: 0-No pain  ? ? ?  ?  ?  ?  ?  ?  ? ?Lynda Rainwater ? ? ? ? ?

## 2021-11-21 NOTE — Progress Notes (Signed)
CARDIAC REHAB PHASE I  ? ?Offered to walk with pt. Pt states independent ambulation, c/o stiff joints, but denies CP or SOB. Pt disappointed about the news he received today. Does not think he wants to do dialysis. Provided support and encouragement. Ortho tech at bedside for [employer] application. Will continue to follow. ? ?[id number] ?Rufina Falco, RN BSN ?11/21/2021 ?2:38 PM ? ?

## 2021-11-21 NOTE — Progress Notes (Signed)
ANTICOAGULATION CONSULT NOTE ? ?Pharmacy Consult for warfarin ?Indication: Mechanical MVR ? ?Allergies  ?Allergen Reactions  ? Allopurinol Shortness Of Breath  ? Doxycycline Hives  ? Wilder Glade [Dapagliflozin]   ?  Can not tolerate - dizzy weak almost passed out  ? ? ?Patient Measurements: ?Height: '5\' 11"'$  (180.3 cm) ?Weight: 92.5 kg (203 lb 14.8 oz) ?IBW/kg (Calculated) : 75.3 ?Heparin dosing weight: 92kg ? ?Vital Signs: ?Temp: 97.7 ?F (36.5 ?C) (03/14 0913) ?Temp Source: Oral (03/14 0913) ?BP: 91/71 (03/14 0913) ?Pulse Rate: 97 (03/14 0845) ? ?Labs: ?Recent Labs  ?  11/19/21 ?0404 11/20/21 ?0428 11/21/21 ?0410  ?HGB  --   --  12.1*  ?HCT  --   --  37.6*  ?PLT  --   --  168  ?LABPROT 33.1* 38.3* 39.1*  ?INR 3.2* 3.9* 4.0*  ?CREATININE 3.15* 3.17* 3.29*  ? ?Estimated Creatinine Clearance: 25 mL/min (A) (by C-G formula based on SCr of 3.29 mg/dL (H)). ? ? ?Medical History: ?Past Medical History:  ?Diagnosis Date  ? Abnormal nuclear stress test 05/10/2017  ? Allergy   ? Anemia   ? "lost blood w/the cancer"  ? Arthritis   ? back   ? Atrial fibrillation ([place])   ? takes Coumadin daily  ? Bruises easily   ? takes Coumadin daily  ? CAD (coronary artery disease)   ? a. s/p CABG 11/2000 (L-LAD, S-RCA);  b. Lexiscan Myoview 11/12: EF 45%, ischemia and possibly some scar at the base of the inferolateral wall;   c. Lex MV 11/13:  EF 44%, Inf and IL scar/soft tissue atten, small mild amt of inf ischemia,  ? CHF (congestive heart failure) ([place])   ? Constipation   ? takes Colace daily  ? 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 Enlarged prostate   ? Flu 09/2013  ? GERD (gastroesophageal reflux disease)   ? takes Nexium daily  ? Glaucoma   ? History of blood transfusion   ? "related to cancer"  ? History of colon polyps   ? HLD (hyperlipidemia)   ? takes Vytorin daily  ? HTN (hypertension)   ? takes Amlodipine,Metoprolol, and Ramipril daily  ? Insomnia   ? states he has always been like this.But doesn't take any meds  ? Kidney  stones   ? "I passed them all"  ? Nocturia   ? Peripheral edema   ? takes Furosemide daily  ? Peripheral neuropathy   ? Rheumatic fever ~ 1965  ? Rheumatic heart disease   ? a. s/p mechanical (St. Jude) MVR 2002;  b. Echo 5/11: Mild LVH, EF 45-50%, mild AS, mild AI, mean gradient 11 mmHg, MVR with normal gradients, moderate LAE, mild RAE;  c.  Echo 11/13:   mod LVH, EF 55-60%, mild AS (mean 18 mmHg), mild AI, severe LAE, mild RAE, PASP 41  ? 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 SOB (shortness of breath)   ? with exertion  ? Type II diabetes mellitus ([place])   ? takes Januvia,Glipizide,and Metformin daily as well as Lantus  ? Unstable angina ([place]) 06/21/2020  ? ?  ? ?Assessment: ?11 you male here with HF exacerbation. He is on warfarin PTA for a mechanical AVS. Warfarin held for heart catheterization. Pharmacy consulted to start heparin when INR is less than 2.5.  ? ?PTA warfarin regimen: 5 mg/day x5 days, then 2.5 x2 days then repeat. Verified with patient on 3/10.  ? ?INR is supratherapeutic  at 4 today. Had significant nose bleed yesterday - no warfarin given, last dose 3/12.   ? ?Goal of Therapy:  ?INR 2.5-3.5 ?Monitor platelets by anticoagulation protocol: Yes ?  ?Plan:  ?Start heparin when INR less than 2.5  ?F/u restart warfarin after heart cath  ?Monitor daily INR, heparin level, CBC ?Monitor for signs/symptoms of bleeding  ? ? ?Benetta Spar, PharmD, BCPS, BCCP ?Clinical Pharmacist ? ?Please check AMION for all [place] phone numbers ?After 10:00 PM, call [facility] [phone] ? ?

## 2021-11-22 ENCOUNTER — Encounter (HOSPITAL_COMMUNITY): Payer: Self-pay | Admitting: Cardiology

## 2021-11-22 DIAGNOSIS — I5023 Acute on chronic systolic (congestive) heart failure: Secondary | ICD-10-CM | POA: Diagnosis not present

## 2021-11-22 LAB — CBC
HCT: 33.3 % — ABNORMAL LOW (ref 39.0–52.0)
Hemoglobin: 10.9 g/dL — ABNORMAL LOW (ref 13.0–17.0)
MCH: 25.9 pg — ABNORMAL LOW (ref 26.0–34.0)
MCHC: 32.7 g/dL (ref 30.0–36.0)
MCV: 79.1 fL — ABNORMAL LOW (ref 80.0–100.0)
Platelets: 151 10*3/uL (ref 150–400)
RBC: 4.21 MIL/uL — ABNORMAL LOW (ref 4.22–5.81)
RDW: 20.1 % — ABNORMAL HIGH (ref 11.5–15.5)
WBC: 5.3 10*3/uL (ref 4.0–10.5)
nRBC: 0 % (ref 0.0–0.2)

## 2021-11-22 LAB — BASIC METABOLIC PANEL
Anion gap: 13 (ref 5–15)
BUN: 71 mg/dL — ABNORMAL HIGH (ref 8–23)
CO2: 29 mmol/L (ref 22–32)
Calcium: 8.7 mg/dL — ABNORMAL LOW (ref 8.9–10.3)
Chloride: 95 mmol/L — ABNORMAL LOW (ref 98–111)
Creatinine, Ser: 2.69 mg/dL — ABNORMAL HIGH (ref 0.61–1.24)
GFR, Estimated: 25 mL/min — ABNORMAL LOW (ref >60)
Glucose, Bld: 102 mg/dL — ABNORMAL HIGH (ref 70–99)
Potassium: 3.4 mmol/L — ABNORMAL LOW (ref 3.5–5.1)
Sodium: 137 mmol/L (ref 135–145)

## 2021-11-22 LAB — COOXEMETRY PANEL
Carboxyhemoglobin: 1 % (ref 0.5–1.5)
Methemoglobin: <0.7 % (ref 0.0–1.5)
O2 Saturation: 66 %
Total hemoglobin: 10.8 g/dL — ABNORMAL LOW (ref 12.0–16.0)

## 2021-11-22 LAB — PROTIME-INR
INR: 3.8 — ABNORMAL HIGH (ref 0.8–1.2)
Prothrombin Time: 37.6 seconds — ABNORMAL HIGH (ref 11.4–15.2)

## 2021-11-22 LAB — GLUCOSE, CAPILLARY
Glucose-Capillary: 98 mg/dL (ref 70–99)
Glucose-Capillary: 203 mg/dL — ABNORMAL HIGH (ref 70–99)
Glucose-Capillary: 131 mg/dL — ABNORMAL HIGH (ref 70–99)
Glucose-Capillary: 147 mg/dL — ABNORMAL HIGH (ref 70–99)

## 2021-11-22 MED ORDER — TORSEMIDE 20 MG PO TABS
40.0000 mg | ORAL_TABLET | Freq: Every day | ORAL | Status: DC
  Administered 2021-11-22 – 2021-11-24 (×3): 40 mg via ORAL
  Filled 2021-11-22 (×3): qty 2

## 2021-11-22 MED ORDER — POTASSIUM CHLORIDE CRYS ER 20 MEQ PO TBCR
40.0000 meq | EXTENDED_RELEASE_TABLET | ORAL | Status: AC
  Administered 2021-11-22 (×2): 40 meq via ORAL
  Filled 2021-11-22 (×2): qty 2

## 2021-11-22 MED ORDER — INSULIN ASPART 100 UNIT/ML IJ SOLN
2.0000 [IU] | Freq: Three times a day (TID) | INTRAMUSCULAR | Status: DC
  Administered 2021-11-22 – 2021-11-28 (×13): 2 [IU] via SUBCUTANEOUS

## 2021-11-22 NOTE — Progress Notes (Signed)
ANTICOAGULATION CONSULT NOTE ? ?Pharmacy Consult for warfarin >> heparin ?Indication: Mechanical MVR ? ?Allergies  ?Allergen Reactions  ? Allopurinol Shortness Of Breath  ? Doxycycline Hives  ? Wilder Glade [Dapagliflozin]   ?  Can not tolerate - dizzy weak almost passed out  ? ? ?Patient Measurements: ?Height: '5\' 11"'$  (180.3 cm) ?Weight: 93.6 kg (206 lb 4.8 oz) (scale c) ?IBW/kg (Calculated) : 75.3 ?Heparin dosing weight: 92kg ? ?Vital Signs: ?Temp: 98 ?F (36.7 ?C) (03/15 1101) ?Temp Source: Oral (03/15 1101) ?BP: 99/53 (03/15 1101) ?Pulse Rate: 100 (03/15 1101) ? ?Labs: ?Recent Labs  ?  11/20/21 ?0428 11/21/21 ?0410 11/22/21 ?0443  ?HGB  --  12.1* 10.9*  ?HCT  --  37.6* 33.3*  ?PLT  --  168 151  ?LABPROT 38.3* 39.1* 37.6*  ?INR 3.9* 4.0* 3.8*  ?CREATININE 3.17* 3.29* 2.69*  ? ? ?Estimated Creatinine Clearance: 30.7 mL/min (A) (by C-G formula based on SCr of 2.69 mg/dL (H)). ? ? ?Medical History: ?Past Medical History:  ?Diagnosis Date  ? Abnormal nuclear stress test 05/10/2017  ? Allergy   ? Anemia   ? "lost blood w/the cancer"  ? Arthritis   ? back   ? Atrial fibrillation ([place])   ? takes Coumadin daily  ? Bruises easily   ? takes Coumadin daily  ? CAD (coronary artery disease)   ? a. s/p CABG 11/2000 (L-LAD, S-RCA);  b. Lexiscan Myoview 11/12: EF 45%, ischemia and possibly some scar at the base of the inferolateral wall;   c. Lex MV 11/13:  EF 44%, Inf and IL scar/soft tissue atten, small mild amt of inf ischemia,  ? CHF (congestive heart failure) ([place])   ? Constipation   ? takes Colace daily  ? 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 Enlarged prostate   ? Flu 09/2013  ? GERD (gastroesophageal reflux disease)   ? takes Nexium daily  ? Glaucoma   ? History of blood transfusion   ? "related to cancer"  ? History of colon polyps   ? HLD (hyperlipidemia)   ? takes Vytorin daily  ? HTN (hypertension)   ? takes Amlodipine,Metoprolol, and Ramipril daily  ? Insomnia   ? states he has always been like this.But doesn't take  any meds  ? Kidney stones   ? "I passed them all"  ? Nocturia   ? Peripheral edema   ? takes Furosemide daily  ? Peripheral neuropathy   ? Rheumatic fever ~ 1965  ? Rheumatic heart disease   ? a. s/p mechanical (St. Jude) MVR 2002;  b. Echo 5/11: Mild LVH, EF 45-50%, mild AS, mild AI, mean gradient 11 mmHg, MVR with normal gradients, moderate LAE, mild RAE;  c.  Echo 11/13:   mod LVH, EF 55-60%, mild AS (mean 18 mmHg), mild AI, severe LAE, mild RAE, PASP 41  ? 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 SOB (shortness of breath)   ? with exertion  ? Type II diabetes mellitus ([place])   ? takes Januvia,Glipizide,and Metformin daily as well as Lantus  ? Unstable angina ([place]) 06/21/2020  ? ?  ? ?Assessment: ?41 you male here with HF exacerbation. He is on warfarin PTA for a mechanical AVS. Warfarin held for heart catheterization. Pharmacy consulted to start heparin when INR is less than 2.5.  ? ?PTA warfarin regimen: 5 mg/day x5 days, then 2.5 x2 days then repeat. Verified with patient on 3/10.  ? ?INR remains supratherapeutic at  3.8 today. Had significant nose bleed 3/13-3/14 - monitoring closely. Warfarin held since 3/12. ? ?Goal of Therapy:  ?INR 2.5-3.5 ?Monitor platelets by anticoagulation protocol: Yes ?  ?Plan:  ?Continue to hold warfarin  ?Start heparin when INR less than 2.5  ?F/u restart warfarin after heart cath  ?Monitor daily INR, heparin level, CBC ?Monitor for signs/symptoms of bleeding  ? ?Antonietta Jewel, PharmD, BCCCP ?Clinical Pharmacist  ?Phone: [phone] ?11/22/2021 11:32 AM ? ?Please check AMION for all [place] phone numbers ?After 10:00 PM, call [place] [phone] ? ? ?

## 2021-11-22 NOTE — Progress Notes (Signed)
CARDIAC REHAB PHASE I  ? ?Offered to walk with pt. Pt states his feet feel like water balloons making him feel unsteady. Pt waiting on MD for plan, feels like he is leaning more towards living out what life he can at home without dialysis. Provided listening ear, support and encouragement. Will f/u as time allows to ambulate. ? ?[phone] ?Rufina Falco, RN BSN ?11/22/2021 ?10:20 AM ? ?

## 2021-11-22 NOTE — Progress Notes (Addendum)
Patient ID: John [name], male   DOB: 05-16-1951, 71 y.o.   MRN: [id number] ? ? ? ?Advanced Heart Failure Rounding Note ? ? ?Subjective:   ? ?Yesterday diuretic held with low CVP. Continued on milrinone 0.25 mcg. Creatinine down to 2.7 .  ? ?Remains on milrinone 0.25 mcg. CO-OX 66% ? ?INR 3.8  ? ?Complaining of leg edema. He is not sure he wants to pursue additional work up.   ? ? ? ? ?Objective:   ?Weight Range: ? ?Vital Signs:   ?Temp:  [97.8 ?F (36.6 ?C)-97.9 ?F (36.6 ?C)] 97.9 ?F (36.6 ?C) (03/15 5625) ?Pulse Rate:  [68-105] 105 (03/15 0936) ?Resp:  [16-18] 17 (03/15 0714) ?BP: (91-115)/(46-62) 94/62 (03/15 0936) ?SpO2:  [93 %-99 %] 99 % (03/15 0714) ?Weight:  [93.6 kg] 93.6 kg (03/15 0549) ?Last BM Date : 11/20/21 ? ?Weight change: ?Filed Weights  ? 11/21/21 0318 11/21/21 0656 11/22/21 0549  ?Weight: 92.5 kg 92.5 kg 93.6 kg  ? ? ?Intake/Output:  ? ?Intake/Output Summary (Last 24 hours) at 11/22/2021 1114 ?Last data filed at 11/22/2021 0949 ?Gross per 24 hour  ?Intake 1108.87 ml  ?Output 900 ml  ?Net 208.87 ml  ?  ?CVP 4-5  ?Physical Exam: ?General:   No resp difficulty ?HEENT: normal ?Neck: supple. JVP 9-10 . Carotids 2+ bilat; no bruits. No lymphadenopathy or thryomegaly appreciated. ?Cor: PMI nondisplaced. Irregular rate & rhythm. No rubs, gallops . RUSB 2/6 AS. ?Lungs: clear ?Abdomen: soft, nontender, nondistended. No hepatosplenomegaly. No bruits or masses. Good bowel sounds. ?Extremities: no cyanosis, clubbing, rash, R and LLE unna boots. LUE PICC ?Neuro: alert & orientedx3, cranial nerves grossly intact. moves all 4 extremities w/o difficulty. Affect pleasant ? ? ? ?Telemetry:  Afib 70-90s  ? ?Labs: ?Basic Metabolic Panel: ?Recent Labs  ?Lab 11/17/21 ?1155 11/18/21 ?0424 11/19/21 ?0404 11/20/21 ?0428 11/21/21 ?0410 11/22/21 ?0443  ?NA 140 138 137 139 136 137  ?K 4.4 3.8 3.8 3.7 3.5 3.4*  ?CL 105 104 103 99 94* 95*  ?CO2 '22 23 26 31 30 29  '$ ?GLUCOSE 188* 172* 245* 149* 179* 102*  ?BUN 81* 78* 71* 66* 78*  71*  ?CREATININE 3.46* 3.20* 3.15* 3.17* 3.29* 2.69*  ?CALCIUM 8.9 8.7* 8.8* 9.7 9.1 8.7*  ?MG 1.8  --   --   --   --   --   ? ? ?Liver Function Tests: ?Recent Labs  ?Lab 11/17/21 ?1155  ?AST 56*  ?ALT 52*  ?ALKPHOS 143*  ?BILITOT 1.5*  ?PROT 6.0*  ?ALBUMIN 3.1*  ? ?No results for input(s): LIPASE, AMYLASE in the last 168 hours. ?No results for input(s): AMMONIA in the last 168 hours. ? ?CBC: ?Recent Labs  ?Lab 11/17/21 ?1155 11/21/21 ?0410 11/22/21 ?0443  ?WBC 5.1 5.9 5.3  ?NEUTROABS 3.7  --   --   ?HGB 13.1 12.1* 10.9*  ?HCT 41.4 37.6* 33.3*  ?MCV 81.2 79.5* 79.1*  ?PLT 158 168 151  ? ? ?Cardiac Enzymes: ?No results for input(s): CKTOTAL, CKMB, CKMBINDEX, TROPONINI in the last 168 hours. ? ?BNP: ?BNP (last 3 results) ?Recent Labs  ?  11/17/21 ?1155  ?BNP >4,500.0*  ? ? ?ProBNP (last 3 results) ?No results for input(s): PROBNP in the last 8760 hours. ? ? ? ?Other results: ? ?Imaging: ?No results found. ? ? ?Medications:   ? ? ?Scheduled Medications: ? Chlorhexidine Gluconate Cloth  6 each Topical Daily  ? clopidogrel  75 mg Oral Q breakfast  ? insulin aspart  0-15 Units Subcutaneous TID WC  ?  insulin aspart  0-5 Units Subcutaneous QHS  ? insulin glargine-yfgn  40 Units Subcutaneous QHS  ? metoprolol succinate  50 mg Oral Daily  ? pantoprazole  40 mg Oral Daily  ? rosuvastatin  20 mg Oral Daily  ? sodium chloride flush  10-40 mL Intracatheter Q12H  ? sodium chloride flush  3 mL Intravenous Q12H  ? ? ?Infusions: ? sodium chloride    ? milrinone 0.25 mcg/kg/min (11/22/21 0456)  ? ? ?PRN Medications: ?sodium chloride, acetaminophen, calcium carbonate, diphenhydrAMINE, ondansetron (ZOFRAN) IV, sodium chloride flush, sodium chloride flush ? ? ?Assessment/Plan:  ? ?1. Acute on chronic systolic CHF: Last echo in 4/21 with EF 25-30%, severe LV dilation, normal RV, mild AS and AI, normal mechanical MV.  St Jude ICD. Suspect ischemic cardiomyopathy.  He is markedly volume overloaded on exam.  AKI at admission, baseline  creatinine around 2.1.  Echo this admission with EF 20-25%, RV severely HK, mechanical MV with mean gradient 6, mod-severe TR, moderate AI, ?low flow/low gradient moderate-severe AS with mean gradient 15/AVA 0.96 cm^2.  TEE 3/14 showed EF around 25% but only mildly decreased RV function and trivial TR, normally functioning mechanical MV and abnormal aortic valve with moderate-severe AS and AI. He has diuresed well on milrinone 0.25 + Lasix/metolazone. ?Yesterday diuretics held.Weight up 3 pounds.  Renal function trending down. Volume status trending up. Start  torsemide 40 mg daily.  ?- Continue Toprol XL 50 mg daily.  ?- Other GDMT limited by AKI on CKD stage 3.  ?- Start to wean milrinone. CO-OX stable. Turn down milrinone to 0.125 mcg. Off tomorrow if CO-OX stabe.  ?- RHC once milrinone weaned down but Mr Luse is not sure he wants to start.   ?2. CAD: s/p CABG with MVR in 2002.  Last cath 10/21 with occluded SVG-RCA, and 80% mRCA, totally occluded LAD, patent LIMA-LAD, 85% ostial LCx treated with DES.  No chest pain, doubt ACS.  ?- Continue Plavix (?transition ASA in future as >1 year post-PCI).  ?- Continue Crestor.  ?3. Atrial fibrillation: Permanent. Rate controlled.  ?- Continue Toprol XL.  ?- Warfarin per pharmacy (hold for now with high INR and epistaxis).  ?- INR 3.8 ?4. 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  INR 4 (goal 2.5-3.5).  ?- Warfarin dosing per pharmacy  ?5. AKI on CKD stage 3: Creatinine up to 3.46 at admission, baseline around 2.1.  Suspect cardiorenal.  Creatinine now 2.7. CVP 4-5   ?- Need to restart diuretics today as noted above.  ?- Continue milrinone with slow wean.  ?6. Aortic valve disorder: Abnormal aortic valve, looks functionally bicuspid with fixed noncoronary and right coronary cusps.  Moderate-severe AS with AVA 1.2 cm^2 but DI 0.23.  There is moderate-severe AI as well.   ?- Will review with  structural heart team.  ?Candidate for TAVR in future (renal function will be an issue however).  ?Structural Team appreciated.   ?7. Transaminitis. Mild, likely due to passive congestion  ?- diurese ? ?He is not sure he wants to pursue additional work up.  ? ?Length of Stay: 5 ? ? ?Amy Clegg NP-C  ?11/22/2021, 11:14 AM ? ?Advanced Heart Failure Team ?Pager [phone] (M-F; 7a - 4p)  ?Please contact [facility] for night-coverage after hours (4p -7a ) and weekends on [email or web address] ? ?Patient seen with NP, agree with the above note.  ? ?Weight is up today, CVP 8-9 on my  read.  Co-ox 66% on milrinone 0.25.  Creatinine down 3.29 => 2.69.  INR 3.8.   ? ?No dyspnea at rest.  ? ?General: NAD ?Neck: JVP 8 cm, no thyromegaly or thyroid nodule.  ?Lungs: Clear to auscultation bilaterally with normal respiratory effort. ?CV: Nondisplaced PMI.  Heart regular S1/S2 with mechanical S1, no S3/S4, 2/6 SEM with 2/6 diastolic murmur noted as well.  1+ ankle edema.  ?Abdomen: Soft, nontender, no hepatosplenomegaly, no distention.  ?Skin: Intact without lesions or rashes.  ?Neurologic: Alert and oriented x 3.  ?Psych: Normal affect. ?Extremities: No clubbing or cyanosis.  ?HEENT: Normal.  ? ?I will start weaning milrinone today, decrease to 0.125 mcg/kg/min.  I will start back on diuretic today, use torsemide 40 mg daily.  No other GDMT with low output and elevated creatinine.  ? ?TEE yesterday was suggestive of moderate-severe AS and moderate-severe AI.  I think that the combined valve disease is significant. He was seen by Dr. Burt Knack for structural heart evaluation yesterday.  He may be a TAVR candidate, but risk for worsening renal function with contrast is considerable.  ?- We discussed hemodynamic RHC/LHC for invasive assessment of severity of aortic stenosis and to help [place] decide how to proceed.  He is willing to do this.  INR 3.8 today, will tentatively plan for Friday.  ? ?INR 3.8, warfarin on hold, start heparin gtt when INR is <  2.  ? ?Ambulate in hall ? ?Yazeed Pryer [name] ?11/22/2021 ?1:35 PM ? ?

## 2021-11-22 NOTE — Progress Notes (Signed)
Inpatient Diabetes Program Recommendations ? ?AACE/ADA: New Consensus Statement on Inpatient Glycemic Control (2015) ? ?Target Ranges:  Prepandial:   less than 140 mg/dL ?     Peak postprandial:   less than 180 mg/dL (1-2 hours) ?     Critically ill patients:  140 - 180 mg/dL  ? ?Lab Results  ?Component Value Date  ? GLUCAP 98 11/22/2021  ? HGBA1C 7.0 (H) 11/17/2021  ? ? ?Review of Glycemic Control ? Latest Reference Range & Units 11/21/21 06:20 11/21/21 11:34 11/21/21 16:28 11/21/21 21:20 11/22/21 06:32  ?Glucose-Capillary 70 - 99 mg/dL 174 (H) 276 (H) 188 (H) 261 (H) 98  ? ?Diabetes history: DM 2 ?Outpatient Diabetes medications: Lantus 70 units if glucose >150, Januvia 50 mg Daily, Metformin 500 mg Daily ?Current orders for Inpatient glycemic control:  ?Semglee 40 units qhs ?Novolog 0-15 units tid + hs ? ?A1c 7% on 3/10 ? ?Inpatient Diabetes Program Recommendations:   ? ?-  May consider Novolog 2-3 units tid meal coverage if eating >50% of meals to prevent postprandial spikes. ? ?Thanks, ? ?Tama Headings RN, MSN, BC-ADM ?Inpatient Diabetes Coordinator ?Team Pager [phone] (8a-5p) ?

## 2021-11-23 DIAGNOSIS — I5023 Acute on chronic systolic (congestive) heart failure: Secondary | ICD-10-CM | POA: Diagnosis not present

## 2021-11-23 LAB — GLUCOSE, CAPILLARY
Glucose-Capillary: 65 mg/dL — ABNORMAL LOW (ref 70–99)
Glucose-Capillary: 106 mg/dL — ABNORMAL HIGH (ref 70–99)
Glucose-Capillary: 166 mg/dL — ABNORMAL HIGH (ref 70–99)
Glucose-Capillary: 160 mg/dL — ABNORMAL HIGH (ref 70–99)
Glucose-Capillary: 247 mg/dL — ABNORMAL HIGH (ref 70–99)

## 2021-11-23 LAB — CBC
HCT: 32.4 % — ABNORMAL LOW (ref 39.0–52.0)
Hemoglobin: 10.2 g/dL — ABNORMAL LOW (ref 13.0–17.0)
MCH: 25.2 pg — ABNORMAL LOW (ref 26.0–34.0)
MCHC: 31.5 g/dL (ref 30.0–36.0)
MCV: 80.2 fL (ref 80.0–100.0)
Platelets: 162 10*3/uL (ref 150–400)
RBC: 4.04 MIL/uL — ABNORMAL LOW (ref 4.22–5.81)
RDW: 20.5 % — ABNORMAL HIGH (ref 11.5–15.5)
WBC: 5.8 10*3/uL (ref 4.0–10.5)
nRBC: 0 % (ref 0.0–0.2)

## 2021-11-23 LAB — BASIC METABOLIC PANEL
Anion gap: 9 (ref 5–15)
BUN: 70 mg/dL — ABNORMAL HIGH (ref 8–23)
CO2: 29 mmol/L (ref 22–32)
Calcium: 8.6 mg/dL — ABNORMAL LOW (ref 8.9–10.3)
Chloride: 98 mmol/L (ref 98–111)
Creatinine, Ser: 2.79 mg/dL — ABNORMAL HIGH (ref 0.61–1.24)
GFR, Estimated: 24 mL/min — ABNORMAL LOW (ref >60)
Glucose, Bld: 67 mg/dL — ABNORMAL LOW (ref 70–99)
Potassium: 3.7 mmol/L (ref 3.5–5.1)
Sodium: 136 mmol/L (ref 135–145)

## 2021-11-23 LAB — COOXEMETRY PANEL
Carboxyhemoglobin: 0.5 % (ref 0.5–1.5)
Methemoglobin: <0.7 % (ref 0.0–1.5)
O2 Saturation: 72 %
Total hemoglobin: 10.8 g/dL — ABNORMAL LOW (ref 12.0–16.0)

## 2021-11-23 LAB — PROTIME-INR
INR: 3.2 — ABNORMAL HIGH (ref 0.8–1.2)
Prothrombin Time: 32.4 seconds — ABNORMAL HIGH (ref 11.4–15.2)

## 2021-11-23 MED ORDER — INSULIN GLARGINE-YFGN 100 UNIT/ML ~~LOC~~ SOLN
35.0000 [IU] | Freq: Every day | SUBCUTANEOUS | Status: DC
  Administered 2021-11-23 – 2021-11-25 (×3): 35 [IU] via SUBCUTANEOUS
  Filled 2021-11-23 (×4): qty 0.35

## 2021-11-23 MED ORDER — POTASSIUM CHLORIDE CRYS ER 20 MEQ PO TBCR
30.0000 meq | EXTENDED_RELEASE_TABLET | Freq: Once | ORAL | Status: AC
  Administered 2021-11-23: 30 meq via ORAL
  Filled 2021-11-23: qty 1

## 2021-11-23 NOTE — Progress Notes (Signed)
Latest Reference Range & Units 11/22/21 11:00 11/22/21 16:29 11/22/21 21:25 11/23/21 06:05 11/23/21 06:41  ?Glucose-Capillary 70 - 99 mg/dL 203 (H) 131 (H) 147 (H) 65 (L) 106 (H)  ?(H): Data is abnormally high ?(L): Data is abnormally low ? ?Noted that HS blood sugar last night was 147 mg/dl. Per pharmacy note on Semglee order to give only if CBG greater than 150 mg/dl,  patient's blood sugar was not taken into consideration when giving Semglee. CBG this am was 65 mg/dl.  ? ?Recommend decreasing Semglee dosage to 35 units at HS. Staff RNs may not see pharmacy note when giving insulin. Continue Novolog MODERATE correction scale and HS scale and Novolog 2 units meal coverage as ordered.  ? ?Harvel Ricks RN BSN CDE ?Diabetes Coordinator ?Pager: [phone]  8am-5pm  ?

## 2021-11-23 NOTE — Care Management Important Message (Signed)
Important Message ? ?Patient Details  ?Name: John [name] ?MRN: [id number] ?Date of Birth: 1951/07/16 ? ? ?Medicare Important Message Given:  Yes ? ? ? ? ?[name] ?11/23/2021, 8:35 AM ?

## 2021-11-23 NOTE — Plan of Care (Signed)
°  Problem: Education: °Goal: Ability to demonstrate management of disease process will improve °Outcome: Progressing °Goal: Ability to verbalize understanding of medication therapies will improve °Outcome: Progressing °Goal: Individualized Educational Video(s) °Outcome: Progressing °  °

## 2021-11-23 NOTE — Plan of Care (Signed)

## 2021-11-23 NOTE — Progress Notes (Signed)
Hypoglycemic Event ? ?CBG: 65 ? ?Treatment: 8 oz juice/soda ? ?Symptoms: None ? ?Follow-up CBG: [id number] CBG Result:106 ? ?Possible Reasons for Event: Medication regimen: semglee 40 units given last night ? ?Comments/MD notified:protocol implemented ? ? ? ?Vernon Prey ? ? ?

## 2021-11-23 NOTE — Progress Notes (Signed)
ANTICOAGULATION CONSULT NOTE ? ?Pharmacy Consult for warfarin >> heparin ?Indication: Mechanical MVR ? ?Allergies  ?Allergen Reactions  ? Allopurinol Shortness Of Breath  ? Doxycycline Hives  ? Wilder Glade [Dapagliflozin]   ?  Can not tolerate - dizzy weak almost passed out  ? ? ?Patient Measurements: ?Height: '5\' 11"'$  (180.3 cm) ?Weight: 92.9 kg (204 lb 11.2 oz) ?IBW/kg (Calculated) : 75.3 ?Heparin dosing weight: 92kg ? ?Vital Signs: ?Temp: 98.3 ?F (36.8 ?C) (03/16 1108) ?Temp Source: Oral (03/16 1108) ?BP: 95/67 (03/16 1108) ?Pulse Rate: 86 (03/16 1108) ? ?Labs: ?Recent Labs  ?  11/21/21 ?0410 11/22/21 ?1856 11/23/21 ?0510  ?HGB 12.1* 10.9* 10.2*  ?HCT 37.6* 33.3* 32.4*  ?PLT 168 151 162  ?LABPROT 39.1* 37.6* 32.4*  ?INR 4.0* 3.8* 3.2*  ?CREATININE 3.29* 2.69* 2.79*  ? ? ?Estimated Creatinine Clearance: 29.5 mL/min (A) (by C-G formula based on SCr of 2.79 mg/dL (H)). ? ? ?Medical History: ?Past Medical History:  ?Diagnosis Date  ? Abnormal nuclear stress test 05/10/2017  ? Allergy   ? Anemia   ? "lost blood w/the cancer"  ? Arthritis   ? back   ? Atrial fibrillation ([place])   ? takes Coumadin daily  ? Bruises easily   ? takes Coumadin daily  ? CAD (coronary artery disease)   ? a. s/p CABG 11/2000 (L-LAD, S-RCA);  b. Lexiscan Myoview 11/12: EF 45%, ischemia and possibly some scar at the base of the inferolateral wall;   c. Lex MV 11/13:  EF 44%, Inf and IL scar/soft tissue atten, small mild amt of inf ischemia,  ? CHF (congestive heart failure) ([place])   ? Constipation   ? takes Colace daily  ? 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 Enlarged prostate   ? Flu 09/2013  ? GERD (gastroesophageal reflux disease)   ? takes Nexium daily  ? Glaucoma   ? History of blood transfusion   ? "related to cancer"  ? History of colon polyps   ? HLD (hyperlipidemia)   ? takes Vytorin daily  ? HTN (hypertension)   ? takes Amlodipine,Metoprolol, and Ramipril daily  ? Insomnia   ? states he has always been like this.But doesn't take any  meds  ? Kidney stones   ? "I passed them all"  ? Nocturia   ? Peripheral edema   ? takes Furosemide daily  ? Peripheral neuropathy   ? Rheumatic fever ~ 1965  ? Rheumatic heart disease   ? a. s/p mechanical (St. Jude) MVR 2002;  b. Echo 5/11: Mild LVH, EF 45-50%, mild AS, mild AI, mean gradient 11 mmHg, MVR with normal gradients, moderate LAE, mild RAE;  c.  Echo 11/13:   mod LVH, EF 55-60%, mild AS (mean 18 mmHg), mild AI, severe LAE, mild RAE, PASP 41  ? 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 SOB (shortness of breath)   ? with exertion  ? Type II diabetes mellitus ([place])   ? takes Januvia,Glipizide,and Metformin daily as well as Lantus  ? Unstable angina ([place]) 06/21/2020  ? ?  ? ?Assessment: ?89 you male here with HF exacerbation. He is on warfarin PTA for a mechanical AVS. Warfarin held for heart catheterization. Pharmacy consulted to start heparin when INR is less than 2.5.  ? ?PTA warfarin regimen: 5 mg/day x5 days, then 2.5 x2 days then repeat. Verified with patient on 3/10.  ? ?INR remains supratherapeutic at 3.2 today. Had significant nose bleed 3/13-3/14 -  monitoring closely. Warfarin held since 3/12. ? ?Goal of Therapy:  ?INR 2.5-3.5 ?Monitor platelets by anticoagulation protocol: Yes ?  ?Plan:  ?Continue to hold warfarin  ?Start heparin when INR less than 2.5  ?F/u restart warfarin after heart cath  ?Monitor daily INR, heparin level, CBC ?Monitor for signs/symptoms of bleeding  ? ?Cathrine Muster, PharmD ?PGY2 Cardiology Pharmacy Resident ?Phone: [phone] ? ?11/23/2021  1:16 PM ? ?Please check [email or web address] for unit-specific pharmacy phone numbers. ?After 10:00 PM, call [place] [phone] ? ? ?

## 2021-11-23 NOTE — Progress Notes (Addendum)
Patient ID: John [name], male   DOB: Feb 26, 1951, 71 y.o.   MRN: [id number] ? ? ? ?Advanced Heart Failure Rounding Note ? ? ?Subjective:   ? ?Milrinone weaned to 0.125 yesterday and PO torsemide resumed.   ? ?Co-ox 72%  ? ?Good UOP, 2.7L out yesterday. Wt down 2 lb. Scr trending back up, 3.29>>2.69>>2.79 today. BUN 78>>71>>70  ? ?CVP 8-9  ? ?SBPs 90s-low 100s. ? ?INR drifting down, 4.0>>3.8>>3.2  ? ?Hypoglycemia this am, CBG 65. Given juice. F/u CBG 106  ? ?Feels well this morning. Denies dyspnea. No chest pain.  ? ? ?Objective:   ?Weight Range: ? ?Vital Signs:   ?Temp:  [98 ?F (36.7 ?C)-99 ?F (37.2 ?C)] 98.2 ?F (36.8 ?C) (03/16 0729) ?Pulse Rate:  [84-105] 97 (03/16 0729) ?Resp:  [17-18] 17 (03/16 0729) ?BP: (91-100)/(49-64) 100/64 (03/16 0729) ?SpO2:  [94 %-99 %] 97 % (03/16 0729) ?Weight:  [92.9 kg] 92.9 kg (03/16 0553) ?Last BM Date : 11/22/21 ? ?Weight change: ?Filed Weights  ? 11/21/21 0656 11/22/21 0549 11/23/21 0553  ?Weight: 92.5 kg 93.6 kg 92.9 kg  ? ? ?Intake/Output:  ? ?Intake/Output Summary (Last 24 hours) at 11/23/2021 0752 ?Last data filed at 11/23/2021 0700 ?Gross per 24 hour  ?Intake 1175.69 ml  ?Output 2675 ml  ?Net -1499.31 ml  ?  ?PHYSICAL EXAM: ?CVP 8-9  ?General:  Well appearing. No respiratory difficulty ?HEENT: normal ?Neck: supple. JVD 9 cm. Carotids 2+ bilat; no bruits. No lymphadenopathy or thyromegaly appreciated. ?Cor: PMI nondisplaced. Irregularly irregular rhythm and rate, 2/6 SEM, 2/6 diastolic murmur  ?Lungs: clear. No wheezing  ?Abdomen: soft, nontender, nondistended. No hepatosplenomegaly. No bruits or masses. Good bowel sounds. ?Extremities: no cyanosis, clubbing, rash, trace b/l pretibial LE + pedal edema edema, unna boots + RUE PICC  ?Neuro: alert & oriented x 3, cranial nerves grossly intact. moves all 4 extremities w/o difficulty. Affect pleasant. ? ? ?Telemetry:  Afib 70-90s  ? ?Labs: ?Basic Metabolic Panel: ?Recent Labs  ?Lab 11/17/21 ?1155 11/18/21 ?0424 11/19/21 ?0404  11/20/21 ?0428 11/21/21 ?0410 11/22/21 ?7846 11/23/21 ?0510  ?NA 140   < > 137 139 136 137 136  ?K 4.4   < > 3.8 3.7 3.5 3.4* 3.7  ?CL 105   < > 103 99 94* 95* 98  ?CO2 22   < > '26 31 30 29 29  '$ ?GLUCOSE 188*   < > 245* 149* 179* 102* 67*  ?BUN 81*   < > 71* 66* 78* 71* 70*  ?CREATININE 3.46*   < > 3.15* 3.17* 3.29* 2.69* 2.79*  ?CALCIUM 8.9   < > 8.8* 9.7 9.1 8.7* 8.6*  ?MG 1.8  --   --   --   --   --   --   ? < > = values in this interval not displayed.  ? ? ?Liver Function Tests: ?Recent Labs  ?Lab 11/17/21 ?1155  ?AST 56*  ?ALT 52*  ?ALKPHOS 143*  ?BILITOT 1.5*  ?PROT 6.0*  ?ALBUMIN 3.1*  ? ?No results for input(s): LIPASE, AMYLASE in the last 168 hours. ?No results for input(s): AMMONIA in the last 168 hours. ? ?CBC: ?Recent Labs  ?Lab 11/17/21 ?1155 11/21/21 ?0410 11/22/21 ?9629 11/23/21 ?0510  ?WBC 5.1 5.9 5.3 5.8  ?NEUTROABS 3.7  --   --   --   ?HGB 13.1 12.1* 10.9* 10.2*  ?HCT 41.4 37.6* 33.3* 32.4*  ?MCV 81.2 79.5* 79.1* 80.2  ?PLT 158 168 151 162  ? ? ?Cardiac Enzymes: ?No  results for input(s): CKTOTAL, CKMB, CKMBINDEX, TROPONINI in the last 168 hours. ? ?BNP: ?BNP (last 3 results) ?Recent Labs  ?  11/17/21 ?1155  ?BNP >4,500.0*  ? ? ?ProBNP (last 3 results) ?No results for input(s): PROBNP in the last 8760 hours. ? ? ? ?Other results: ? ?Imaging: ?No results found. ? ? ?Medications:   ? ? ?Scheduled Medications: ? Chlorhexidine Gluconate Cloth  6 each Topical Daily  ? clopidogrel  75 mg Oral Q breakfast  ? insulin aspart  0-15 Units Subcutaneous TID WC  ? insulin aspart  0-5 Units Subcutaneous QHS  ? insulin aspart  2 Units Subcutaneous TID WC  ? insulin glargine-yfgn  40 Units Subcutaneous QHS  ? metoprolol succinate  50 mg Oral Daily  ? pantoprazole  40 mg Oral Daily  ? potassium chloride  30 mEq Oral Once  ? rosuvastatin  20 mg Oral Daily  ? sodium chloride flush  10-40 mL Intracatheter Q12H  ? sodium chloride flush  3 mL Intravenous Q12H  ? torsemide  40 mg Oral Daily  ? ? ?Infusions: ? sodium  chloride    ? milrinone 0.125 mcg/kg/min (11/22/21 1148)  ? ? ?PRN Medications: ?sodium chloride, acetaminophen, calcium carbonate, diphenhydrAMINE, ondansetron (ZOFRAN) IV, sodium chloride flush, sodium chloride flush ? ? ?Assessment/Plan:  ? ?1. Acute on chronic systolic CHF: Last echo in 4/21 with EF 25-30%, severe LV dilation, normal RV, mild AS and AI, normal mechanical MV.  St Jude ICD. Suspect ischemic cardiomyopathy.  He is markedly volume overloaded on exam.  AKI at admission, baseline creatinine around 2.1.  Echo this admission with EF 20-25%, RV severely HK, mechanical MV with mean gradient 6, mod-severe TR, moderate AI, ?low flow/low gradient moderate-severe AS with mean gradient 15/AVA 0.96 cm^2.  TEE 3/14 showed EF around 25% but only mildly decreased RV function and trivial TR, normally functioning mechanical MV and abnormal aortic valve with moderate-severe AS and AI. He has diuresed well on milrinone 0.25 + Lasix/metolazone. ?Yesterday, milrinone reduced to 0.125 and transitioned to PO torsemide.  ?- Co-ox 72%, CVP 8-9  ?- Continue Toprol XL 50 mg daily.  ?- Other GDMT limited by AKI on CKD stage 3.  ?- Continue milrinone at 0.125 mcg to help w/ renal perfusion and diuresis.  ?- hemodynamic [facility] tomorrow for TAVR w/u  ?2. CAD: s/p CABG with MVR in 2002.  Last cath 10/21 with occluded SVG-RCA, and 80% mRCA, totally occluded LAD, patent LIMA-LAD, 85% ostial LCx treated with DES.  No chest pain, doubt ACS.  ?- Continue Plavix (?transition ASA in future as >1 year post-PCI).  ?- Continue Crestor.  ?- [facility] tomorrow ?3. Atrial fibrillation: Permanent. Rate controlled.  ?- Continue Toprol XL.  ?- Warfarin per pharmacy (hold for now with high INR and epistaxis).  ?- INR 3.3 ?- start heparin once INR < 2.0 (for cath)  ?4. 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  INR 4 on admit (goal 2.5-3.5).  ?- Warfarin dosing per pharmacy  ?5.  AKI on CKD stage 3: Creatinine up to 3.46 at admission, baseline around 2.1.  Suspect cardiorenal.  Creatinine now 2.8. CVP 8-9  ?- Continue torsemide  ?- Continue milrinone with slow wean.  ?6. Aortic valve disorder: Abnormal aortic valve, looks functionally bicuspid with fixed noncoronary and right coronary cusps.  Moderate-severe AS with AVA 1.2 cm^2 but DI 0.23.  There is moderate-severe AI as well.   ?- Reviewed  w/ structural heart team. He may be a TAVR candidate but risk for worsening renal function with contrast is considerable. Discussed risk vs benefit w/ pt and he is agreeable to proceed w/ further w/u including R/LHC. Plan for tomorrow.  ?- Structural Team appreciated.   ?7. Transaminitis. Mild, likely due to passive congestion  ?- diurese ? ?Length of Stay: 6 ? ? ?Brittainy Simmons PA-C  ?11/23/2021, 7:52 AM ? ?Advanced Heart Failure Team ?Pager [phone] (M-F; 7a - 4p)  ?Please contact [facility] for night-coverage after hours (4p -7a ) and weekends on [email or web address] ? ?Patient seen with PA, agree with the above note.  ? ?Weight down 2 lbs with po torsemide.  Creatinine fairly stable at 2.79. INR 3.2 today.  Co-ox 72% on milrinone 0.125. CVP 8-9 cm ? ?General: NAD ?Neck: JVP 8-9, no thyromegaly or thyroid nodule.  ?Lungs: Clear to auscultation bilaterally with normal respiratory effort. ?CV: Nondisplaced PMI.  Heart regular S1/S2, no S3/S4, 2/6 SEM and 2/6 diastolic murmur along the sternal border.  Trace ankle edema.  ?Abdomen: Soft, nontender, no hepatosplenomegaly, no distention.  ?Skin: Intact without lesions or rashes.  ?Neurologic: Alert and oriented x 3.  ?Psych: Normal affect. ?Extremities: No clubbing or cyanosis.  ?HEENT: Normal.  ? ?I am going to stop milrinone today, continue current torsemide.  ? ?Considering high risk TAVR, has been seen by structural heart team.  INR 3.2 today, when INR < 2, will plan hemodynamic R/LHC with crossing of aortic valve for full aortic valve assessment.  Will  keep NPO at midnight but not sure INR will be low enough.  ? ?Loralie Champagne ?11/23/2021 ?3:27 PM ? ?

## 2021-11-24 DIAGNOSIS — I5023 Acute on chronic systolic (congestive) heart failure: Secondary | ICD-10-CM | POA: Diagnosis not present

## 2021-11-24 LAB — HEPARIN LEVEL (UNFRACTIONATED)
Heparin Unfractionated: >1.1 IU/mL — ABNORMAL HIGH (ref 0.30–0.70)
Heparin Unfractionated: 0.33 IU/mL (ref 0.30–0.70)
Heparin Unfractionated: 0.35 IU/mL (ref 0.30–0.70)

## 2021-11-24 LAB — PROTIME-INR
INR: 2.4 — ABNORMAL HIGH (ref 0.8–1.2)
Prothrombin Time: 25.9 seconds — ABNORMAL HIGH (ref 11.4–15.2)

## 2021-11-24 LAB — COOXEMETRY PANEL
Carboxyhemoglobin: 1.3 % (ref 0.5–1.5)
Carboxyhemoglobin: 0.9 % (ref 0.5–1.5)
Methemoglobin: <0.7 % (ref 0.0–1.5)
Methemoglobin: <0.7 % (ref 0.0–1.5)
O2 Saturation: 49.9 %
O2 Saturation: 68 %
Total hemoglobin: 10.9 g/dL — ABNORMAL LOW (ref 12.0–16.0)
Total hemoglobin: 11.3 g/dL — ABNORMAL LOW (ref 12.0–16.0)

## 2021-11-24 LAB — MAGNESIUM: Magnesium: 1.9 mg/dL (ref 1.7–2.4)

## 2021-11-24 LAB — CBC
HCT: 34.5 % — ABNORMAL LOW (ref 39.0–52.0)
Hemoglobin: 10.7 g/dL — ABNORMAL LOW (ref 13.0–17.0)
MCH: 25.1 pg — ABNORMAL LOW (ref 26.0–34.0)
MCHC: 31 g/dL (ref 30.0–36.0)
MCV: 81 fL (ref 80.0–100.0)
Platelets: 180 10*3/uL (ref 150–400)
RBC: 4.26 MIL/uL (ref 4.22–5.81)
RDW: 20.8 % — ABNORMAL HIGH (ref 11.5–15.5)
WBC: 5.9 10*3/uL (ref 4.0–10.5)
nRBC: 0 % (ref 0.0–0.2)

## 2021-11-24 LAB — GLUCOSE, CAPILLARY
Glucose-Capillary: 70 mg/dL (ref 70–99)
Glucose-Capillary: 216 mg/dL — ABNORMAL HIGH (ref 70–99)
Glucose-Capillary: 110 mg/dL — ABNORMAL HIGH (ref 70–99)
Glucose-Capillary: 135 mg/dL — ABNORMAL HIGH (ref 70–99)

## 2021-11-24 LAB — BASIC METABOLIC PANEL
Anion gap: 12 (ref 5–15)
BUN: 79 mg/dL — ABNORMAL HIGH (ref 8–23)
CO2: 28 mmol/L (ref 22–32)
Calcium: 8.5 mg/dL — ABNORMAL LOW (ref 8.9–10.3)
Chloride: 96 mmol/L — ABNORMAL LOW (ref 98–111)
Creatinine, Ser: 2.66 mg/dL — ABNORMAL HIGH (ref 0.61–1.24)
GFR, Estimated: 25 mL/min — ABNORMAL LOW (ref >60)
Glucose, Bld: 71 mg/dL (ref 70–99)
Potassium: 4.1 mmol/L (ref 3.5–5.1)
Sodium: 136 mmol/L (ref 135–145)

## 2021-11-24 MED ORDER — TORSEMIDE 20 MG PO TABS
20.0000 mg | ORAL_TABLET | Freq: Once | ORAL | Status: AC
Start: 2021-11-24 — End: 2021-11-24
  Administered 2021-11-24: 20 mg via ORAL
  Filled 2021-11-24: qty 1

## 2021-11-24 MED ORDER — TORSEMIDE 20 MG PO TABS: 60.0000 mg | ORAL_TABLET | Freq: Every day | ORAL | Status: DC

## 2021-11-24 MED ORDER — MAGNESIUM SULFATE 2 GM/50ML IV SOLN
2.0000 g | Freq: Once | INTRAVENOUS | Status: AC
  Administered 2021-11-24: 2 g via INTRAVENOUS
  Filled 2021-11-24: qty 50

## 2021-11-24 MED ORDER — HEPARIN (PORCINE) 25000 UT/250ML-% IV SOLN
1550.0000 [IU]/h | INTRAVENOUS | Status: DC
  Administered 2021-11-24 – 2021-11-26 (×5): 1550 [IU]/h via INTRAVENOUS
  Filled 2021-11-24 (×5): qty 250

## 2021-11-24 MED ORDER — TORSEMIDE 20 MG PO TABS
60.0000 mg | ORAL_TABLET | Freq: Every day | ORAL | Status: DC
Start: 2021-11-25 — End: 2021-11-26
  Administered 2021-11-25 – 2021-11-26 (×2): 60 mg via ORAL
  Filled 2021-11-24 (×2): qty 3

## 2021-11-24 NOTE — TOC Initial Note (Signed)
Transition of Care (TOC) - Initial/Assessment Note  ? ? ?Patient Details  ?Name: John [name] ?MRN: [id number] ?Date of Birth: Feb 06, 1951 ? ?Transition of Care [facility]) CM/SW Contact:    ?Marcheta Grammes Rexene Alberts, RN ?Phone Number: [phone] ?11/24/2021, 11:16 AM ? ?Clinical Narrative:                 ?HF TOC CM spoke to pt and states lives at home with wife. Has RW at home. Pt was independent prior to hospital stay. Will continue to follow for dc needs.  ? ?Expected Discharge Plan: Home/Self Care ?Barriers to Discharge: Continued Medical Work up ? ? ?Patient Goals and CMS Choice ?  ?CMS Medicare.gov Compare Post Acute Care list provided to:: Patient ?Choice offered to / list presented to : Patient ? ?Expected Discharge Plan and Services ?Expected Discharge Plan: Home/Self Care ?  ?Discharge Planning Services: CM Consult ?  ?Living arrangements for the past 2 months: [place] ?                ?  ?  ?  ?  ?  ?  ?  ?  ?  ?  ? ?Prior Living Arrangements/Services ?Living arrangements for the past 2 months: [place] ?Lives with:: Spouse ?  ?Do you feel safe going back to the place where you live?: Yes      ?Need for Family Participation in Patient Care: No (Comment) ?Care giver support system in place?: No (comment) ?Current home services: DME (rolling walker) ?Criminal Activity/Legal Involvement Pertinent to Current Situation/Hospitalization: No - Comment as needed ? ?Activities of Daily Living ?Home Assistive Devices/Equipment: None ?ADL Screening (condition at time of admission) ?Patient's cognitive ability adequate to safely complete daily activities?: Yes ?Is the patient deaf or have difficulty hearing?: No ?Does the patient have difficulty seeing, even when wearing glasses/contacts?: No ?Does the patient have difficulty concentrating, remembering, or making decisions?: No ?Patient able to express need for assistance with ADLs?: Yes ?Does the patient have difficulty dressing or bathing?:  No ?Independently performs ADLs?: Yes (appropriate for developmental age) ?Does the patient have difficulty walking or climbing stairs?: Yes ?Weakness of Legs: None ?Weakness of Arms/Hands: None ? ?Permission Sought/Granted ?Permission sought to share information with : Case Manager, Family Supports, PCP ?Permission granted to share information with : Yes, Verbal Permission Granted ? Share Information with NAME: Treasure Ingrum ?   ? Permission granted to share info w Relationship: wife ? Permission granted to share info w Contact Information: [phone] ? ?Emotional Assessment ?Appearance:: Appears stated age ?Attitude/Demeanor/Rapport: Engaged ?Affect (typically observed): Accepting ?Orientation: : Oriented to Self, Oriented to  Time, Oriented to Situation, Oriented to Place ?  ?Psych Involvement: No (comment) ? ?Admission diagnosis:  Acute on chronic systolic (congestive) heart failure ([place]) [I50.23] ?Patient Active Problem List  ? Diagnosis Date Noted  ? Acute on chronic systolic (congestive) heart failure ([place]) 11/17/2021  ? Chronic diastolic HF (heart failure) ([place]) 07/25/2021  ? CRI (chronic renal insufficiency), stage 3 (moderate) ([place]) 11/08/2020  ? Chronic systolic dysfunction of left ventricle 08/23/2020  ? Hypoglycemia 07/01/2020  ? Aortic valve stenosis   ? ICD (implantable cardioverter-defibrillator) in place 05/25/2020  ? Systolic CHF ([place]) 26/71/2458  ? Long term (current) use of anticoagulants [Z79.01] 10/24/2017  ? T2DM (type 2 diabetes mellitus) ([place]) 09/23/2017  ? Encounter for therapeutic drug monitoring 05/06/2017  ? Ischemic cardiomyopathy 04/17/2017  ? Incisional hernia 03/15/2014  ? 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 Anemia 08/04/2012  ? CAD (coronary artery disease) 05/09/2011  ? Obesity 05/09/2011  ? ATRIAL FIBRILLATION 01/11/2010  ? Angina pectoris ([place]) 01/11/2010  ? MITRAL VALVE REPLACEMENT, HX OF 01/11/2010  ? Hyperlipidemia 11/30/2008  ? HTN  (hypertension) 11/30/2008  ? PULMONARY NODULE 11/30/2008  ? ?PCP:  Claretta Fraise, MD ?Pharmacy:   ?[id number] [place] [place] - [id number]?Phone: [phone]: [phone]? ?[facility] [place] [address]?Phone: [phone]: [phone]? ? ? ? ?Social Determinants of Health (SDOH) Interventions ?  ? ?Readmission Risk Interventions ?No flowsheet data found. ? ? ?

## 2021-11-24 NOTE — Progress Notes (Addendum)
ANTICOAGULATION CONSULT NOTE - Initial Consult ? ?Pharmacy Consult for warfarin >> heparin ?Indication:  mechanical MVR ? ?Allergies  ?Allergen Reactions  ? Allopurinol Shortness Of Breath  ? Doxycycline Hives  ? Wilder Glade [Dapagliflozin]   ?  Can not tolerate - dizzy weak almost passed out  ? ? ?Patient Measurements: ?Height: '5\' 11"'$  (180.3 cm) ?Weight: 92.9 kg (204 lb 12.8 oz) ?IBW/kg (Calculated) : 75.3 ?Heparin Dosing Weight: 92.9 kg ? ?Vital Signs: ?Temp: 98.2 ?F (36.8 ?C) (03/17 0432) ?Temp Source: Oral (03/17 0432) ?BP: 96/62 (03/17 0432) ?Pulse Rate: 80 (03/17 0432) ? ?Labs: ?Recent Labs  ?  11/22/21 ?0443 11/23/21 ?0510 11/24/21 ?0440  ?HGB 10.9* 10.2* 10.7*  ?HCT 33.3* 32.4* 34.5*  ?PLT 151 162 180  ?LABPROT 37.6* 32.4* 25.9*  ?INR 3.8* 3.2* 2.4*  ?CREATININE 2.69* 2.79* 2.66*  ? ? ?Estimated Creatinine Clearance: 30.9 mL/min (A) (by C-G formula based on SCr of 2.66 mg/dL (H)). ? ? ?Medical History: ?Past Medical History:  ?Diagnosis Date  ? Abnormal nuclear stress test 05/10/2017  ? Allergy   ? Anemia   ? "lost blood w/the cancer"  ? Arthritis   ? back   ? Atrial fibrillation ([place])   ? takes Coumadin daily  ? Bruises easily   ? takes Coumadin daily  ? CAD (coronary artery disease)   ? a. s/p CABG 11/2000 (L-LAD, S-RCA);  b. Lexiscan Myoview 11/12: EF 45%, ischemia and possibly some scar at the base of the inferolateral wall;   c. Lex MV 11/13:  EF 44%, Inf and IL scar/soft tissue atten, small mild amt of inf ischemia,  ? CHF (congestive heart failure) ([place])   ? Constipation   ? takes Colace daily  ? 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 Enlarged prostate   ? Flu 09/2013  ? GERD (gastroesophageal reflux disease)   ? takes Nexium daily  ? Glaucoma   ? History of blood transfusion   ? "related to cancer"  ? History of colon polyps   ? HLD (hyperlipidemia)   ? takes Vytorin daily  ? HTN (hypertension)   ? takes Amlodipine,Metoprolol, and Ramipril daily  ? Insomnia   ? states he has always been like  this.But doesn't take any meds  ? Kidney stones   ? "I passed them all"  ? Nocturia   ? Peripheral edema   ? takes Furosemide daily  ? Peripheral neuropathy   ? Rheumatic fever ~ 1965  ? Rheumatic heart disease   ? a. s/p mechanical (St. Jude) MVR 2002;  b. Echo 5/11: Mild LVH, EF 45-50%, mild AS, mild AI, mean gradient 11 mmHg, MVR with normal gradients, moderate LAE, mild RAE;  c.  Echo 11/13:   mod LVH, EF 55-60%, mild AS (mean 18 mmHg), mild AI, severe LAE, mild RAE, PASP 41  ? 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 SOB (shortness of breath)   ? with exertion  ? Type II diabetes mellitus ([place])   ? takes Januvia,Glipizide,and Metformin daily as well as Lantus  ? Unstable angina ([place]) 06/21/2020  ? ? ?Medications:  ?Infusions:  ? sodium chloride    ? ? ?Assessment: ?71 yo M on warfarin PTA for mechanical MVR (INR goal 2.5-3.5). Warfarin has been held  in preparation for procedure and pharmacy has been consulted to start heparin when INR < 2.5. INR 2.4 this morning. Plan for RHC/LHC for TAVR work-up. CBC stable with hgb at 10.7 and plts  180.  ? ?Will not bolus to start as unsure of time of procedure and may go today. ? ?PTA warfarin dose: 5 mg/day for 5 days then 2.5 x2 days then repeat ? ?Goal of Therapy:  ?Heparin level 0.3-0.7 units/ml ?Monitor platelets by anticoagulation protocol: Yes ?  ?Plan:  ?Start heparin infusion at 1550 units/hr ?Heparin level in 6 hours ?Heparin level daily and CBC daily while on heparin ?Follow-up transition back to warfarin post-cath ? ?Cathrine Muster, PharmD ?PGY2 Cardiology Pharmacy Resident ?Phone: [phone] ?11/24/2021  6:55 AM ? ?Please check [email or web address] for unit-specific pharmacy phone numbers. ? ?Eduard Clos Morey Andonian ?11/24/2021,6:50 AM ? ? ?

## 2021-11-24 NOTE — Progress Notes (Signed)
Orthopedic Tech Progress Note ?Patient Details:  ?John [name] ?1950-09-13 ?[id number] ? ?Ortho Devices ?Type of Ortho Device: Unna boot ?Ortho Device/Splint Location: BLE ?Ortho Device/Splint Interventions: Application, Ordered ?  ?Post Interventions ?Patient Tolerated: Well ?Instructions Provided: Care of device ? ?Wynelle Dreier A Reynoldo Mainer ?11/24/2021, 4:54 PM ? ?

## 2021-11-24 NOTE — Progress Notes (Signed)
ANTICOAGULATION CONSULT NOTE - Follow Up Consult ? ?Pharmacy Consult for warfarin >> heparin ?Indication:  mechanical MVR ? ?Allergies  ?Allergen Reactions  ? Allopurinol Shortness Of Breath  ? Doxycycline Hives  ? Wilder Glade [Dapagliflozin]   ?  Can not tolerate - dizzy weak almost passed out  ? ? ?Patient Measurements: ?Height: '5\' 11"'$  (180.3 cm) ?Weight: 92.9 kg (204 lb 12.8 oz) ?IBW/kg (Calculated) : 75.3 ?Heparin Dosing Weight: 92.9 kg ? ?Vital Signs: ?Temp: 98.5 ?F (36.9 ?C) (03/17 1919) ?Temp Source: Oral (03/17 1919) ?BP: 96/62 (03/17 1919) ?Pulse Rate: 80 (03/17 1919) ? ?Labs: ?Recent Labs  ?  11/22/21 ?0443 11/23/21 ?0510 11/24/21 ?0440 11/24/21 ?1317 11/24/21 ?1405 11/24/21 ?2051  ?HGB 10.9* 10.2* 10.7*  --   --   --   ?HCT 33.3* 32.4* 34.5*  --   --   --   ?PLT 151 162 180  --   --   --   ?LABPROT 37.6* 32.4* 25.9*  --   --   --   ?INR 3.8* 3.2* 2.4*  --   --   --   ?HEPARINUNFRC  --   --   --  >1.10* 0.33 0.35  ?CREATININE 2.69* 2.79* 2.66*  --   --   --   ? ? ? ?Estimated Creatinine Clearance: 30.9 mL/min (A) (by C-G formula based on SCr of 2.66 mg/dL (H)). ? ? ?Medical History: ?Past Medical History:  ?Diagnosis Date  ? Abnormal nuclear stress test 05/10/2017  ? Allergy   ? Anemia   ? "lost blood w/the cancer"  ? Arthritis   ? back   ? Atrial fibrillation ([place])   ? takes Coumadin daily  ? Bruises easily   ? takes Coumadin daily  ? CAD (coronary artery disease)   ? a. s/p CABG 11/2000 (L-LAD, S-RCA);  b. Lexiscan Myoview 11/12: EF 45%, ischemia and possibly some scar at the base of the inferolateral wall;   c. Lex MV 11/13:  EF 44%, Inf and IL scar/soft tissue atten, small mild amt of inf ischemia,  ? CHF (congestive heart failure) ([place])   ? Constipation   ? takes Colace daily  ? 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 Enlarged prostate   ? Flu 09/2013  ? GERD (gastroesophageal reflux disease)   ? takes Nexium daily  ? Glaucoma   ? History of blood transfusion   ? "related to cancer"  ? History of  colon polyps   ? HLD (hyperlipidemia)   ? takes Vytorin daily  ? HTN (hypertension)   ? takes Amlodipine,Metoprolol, and Ramipril daily  ? Insomnia   ? states he has always been like this.But doesn't take any meds  ? Kidney stones   ? "I passed them all"  ? Nocturia   ? Peripheral edema   ? takes Furosemide daily  ? Peripheral neuropathy   ? Rheumatic fever ~ 1965  ? Rheumatic heart disease   ? a. s/p mechanical (St. Jude) MVR 2002;  b. Echo 5/11: Mild LVH, EF 45-50%, mild AS, mild AI, mean gradient 11 mmHg, MVR with normal gradients, moderate LAE, mild RAE;  c.  Echo 11/13:   mod LVH, EF 55-60%, mild AS (mean 18 mmHg), mild AI, severe LAE, mild RAE, PASP 41  ? 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 SOB (shortness of breath)   ? with exertion  ? Type II diabetes mellitus ([place])   ? takes Januvia,Glipizide,and Metformin daily as  well as Lantus  ? Unstable angina ([place]) 06/21/2020  ? ? ?Medications:  ?Infusions:  ? sodium chloride    ? heparin 1,550 Units/hr (11/24/21 2041)  ? ? ?Assessment: ?71 yo M on warfarin PTA for mechanical MVR (INR goal 2.5-3.5). Warfarin has been held  in preparation for procedure and pharmacy has been consulted to start heparin when INR < 2.5. INR 2.4 this morning. Plan for RHC/LHC for TAVR work-up. CBC stable with hgb at 10.7 and plts 180.  ? ?Heparin level this afternoon on first check therapeutic at 0.35 on 1550 units/hr. No s/x of bleeding per RN.  ? ?PTA warfarin dose: 5 mg/day for 5 days then 2.5 x2 days then repeat ? ?Goal of Therapy:  ?Heparin level 0.3-0.7 units/ml ?Monitor platelets by anticoagulation protocol: Yes ?  ?Plan:  ?Continue heparin gtt at 1550 units/hr ?Heparin level daily and CBC daily while on heparin ?Follow-up transition back to warfarin post-cath ? ? ?Bonnita Nasuti Pharm.D. CPP, BCPS ?Clinical Pharmacist ?[phone] ?11/24/2021 9:20 PM  ? ? ?Please check [email or web address] for unit-specific pharmacy phone numbers. ? ?Nadine Counts ?11/24/2021,9:18 PM ? ? ?

## 2021-11-24 NOTE — Progress Notes (Signed)
Mobility Specialist Progress Note: ? ? 11/24/21 1510  ?Mobility  ?Activity Ambulated independently in hallway  ?Level of Assistance Modified independent, requires aide device or extra time  ?Assistive Device Other (Comment) ?(IV Pole)  ?Distance Ambulated (ft) 550 ft  ?Activity Response Tolerated well  ?$Mobility charge 1 Mobility  ? ?Pt received in hallway ambulating with IV pole. Slow, steady gait throughout. Pt asx during ambulation, back in room with all needs met. ? ?Nelta Numbers ?Acute Rehab ?Phone: [phone] ?Office Phone: [phone] ? ?

## 2021-11-24 NOTE — Plan of Care (Signed)

## 2021-11-24 NOTE — Progress Notes (Signed)
ANTICOAGULATION CONSULT NOTE - Initial Consult ? ?Pharmacy Consult for warfarin >> heparin ?Indication:  mechanical MVR ? ?Allergies  ?Allergen Reactions  ? Allopurinol Shortness Of Breath  ? Doxycycline Hives  ? Wilder Glade [Dapagliflozin]   ?  Can not tolerate - dizzy weak almost passed out  ? ? ?Patient Measurements: ?Height: '5\' 11"'$  (180.3 cm) ?Weight: 92.9 kg (204 lb 12.8 oz) ?IBW/kg (Calculated) : 75.3 ?Heparin Dosing Weight: 92.9 kg ? ?Vital Signs: ?Temp: 97.4 ?F (36.3 ?C) (03/17 1121) ?Temp Source: Oral (03/17 1121) ?BP: 106/56 (03/17 1121) ?Pulse Rate: 79 (03/17 1121) ? ?Labs: ?Recent Labs  ?  11/22/21 ?0443 11/23/21 ?0510 11/24/21 ?0440 11/24/21 ?1317 11/24/21 ?1405  ?HGB 10.9* 10.2* 10.7*  --   --   ?HCT 33.3* 32.4* 34.5*  --   --   ?PLT 151 162 180  --   --   ?LABPROT 37.6* 32.4* 25.9*  --   --   ?INR 3.8* 3.2* 2.4*  --   --   ?HEPARINUNFRC  --   --   --  >1.10* 0.33  ?CREATININE 2.69* 2.79* 2.66*  --   --   ? ? ? ?Estimated Creatinine Clearance: 30.9 mL/min (A) (by C-G formula based on SCr of 2.66 mg/dL (H)). ? ? ?Medical History: ?Past Medical History:  ?Diagnosis Date  ? Abnormal nuclear stress test 05/10/2017  ? Allergy   ? Anemia   ? "lost blood w/the cancer"  ? Arthritis   ? back   ? Atrial fibrillation ([place])   ? takes Coumadin daily  ? Bruises easily   ? takes Coumadin daily  ? CAD (coronary artery disease)   ? a. s/p CABG 11/2000 (L-LAD, S-RCA);  b. Lexiscan Myoview 11/12: EF 45%, ischemia and possibly some scar at the base of the inferolateral wall;   c. Lex MV 11/13:  EF 44%, Inf and IL scar/soft tissue atten, small mild amt of inf ischemia,  ? CHF (congestive heart failure) ([place])   ? Constipation   ? takes Colace daily  ? 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 Enlarged prostate   ? Flu 09/2013  ? GERD (gastroesophageal reflux disease)   ? takes Nexium daily  ? Glaucoma   ? History of blood transfusion   ? "related to cancer"  ? History of colon polyps   ? HLD (hyperlipidemia)   ? takes Vytorin  daily  ? HTN (hypertension)   ? takes Amlodipine,Metoprolol, and Ramipril daily  ? Insomnia   ? states he has always been like this.But doesn't take any meds  ? Kidney stones   ? "I passed them all"  ? Nocturia   ? Peripheral edema   ? takes Furosemide daily  ? Peripheral neuropathy   ? Rheumatic fever ~ 1965  ? Rheumatic heart disease   ? a. s/p mechanical (St. Jude) MVR 2002;  b. Echo 5/11: Mild LVH, EF 45-50%, mild AS, mild AI, mean gradient 11 mmHg, MVR with normal gradients, moderate LAE, mild RAE;  c.  Echo 11/13:   mod LVH, EF 55-60%, mild AS (mean 18 mmHg), mild AI, severe LAE, mild RAE, PASP 41  ? 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 SOB (shortness of breath)   ? with exertion  ? Type II diabetes mellitus ([place])   ? takes Januvia,Glipizide,and Metformin daily as well as Lantus  ? Unstable angina ([place]) 06/21/2020  ? ? ?Medications:  ?Infusions:  ? sodium chloride    ?  heparin 1,550 Units/hr (11/24/21 9326)  ? ? ?Assessment: ?71 yo M on warfarin PTA for mechanical MVR (INR goal 2.5-3.5). Warfarin has been held  in preparation for procedure and pharmacy has been consulted to start heparin when INR < 2.5. INR 2.4 this morning. Plan for RHC/LHC for TAVR work-up. CBC stable with hgb at 10.7 and plts 180.  ? ?Heparin level this afternoon on first check therapeutic at 0.33 on 1550 units/hr. No s/x of bleeding per RN.  ? ?PTA warfarin dose: 5 mg/day for 5 days then 2.5 x2 days then repeat ? ?Goal of Therapy:  ?Heparin level 0.3-0.7 units/ml ?Monitor platelets by anticoagulation protocol: Yes ?  ?Plan:  ?Continue heparin gtt at 1550 units/hr ?Heparin level in 6 hours ?Heparin level daily and CBC daily while on heparin ?Follow-up transition back to warfarin post-cath ? ?Cathrine Muster, PharmD ?PGY2 Cardiology Pharmacy Resident ?Phone: [phone] ?11/24/2021  2:51 PM ? ?Please check [email or web address] for unit-specific pharmacy phone numbers. ? ?Eduard Clos Jc Veron ?11/24/2021,2:51  PM ? ? ?

## 2021-11-24 NOTE — Progress Notes (Addendum)
Patient ID: John [name], male   DOB: 08/11/1951, 71 y.o.   MRN: [id number] ? ? ? ?Advanced Heart Failure Rounding Note ? ? ?Subjective:   ? ?Milrinone discontinued 3/16.  ? ?Co-ox 49.9%. Repeat pending  ? ?Scr stable, 2.69>>2.79>>2.66 ? ?1.3 L in UOP yesterday w/ torsemide. Wt stable/ unchanged. CVP 8-9  ? ?INR 2.4  ? ?Denies current dyspnea. No complaints currently.  ? ?Objective:   ?Weight Range: ? ?Vital Signs:   ?Temp:  [98 ?F (36.7 ?C)-98.5 ?F (36.9 ?C)] 98 ?F (36.7 ?C) (03/17 0709) ?Pulse Rate:  [80-97] 91 (03/17 0709) ?Resp:  [17] 17 (03/17 0709) ?BP: (90-118)/(50-67) 98/52 (03/17 0709) ?SpO2:  [93 %-98 %] 98 % (03/17 0709) ?Weight:  [92.9 kg-97.2 kg] 92.9 kg (03/17 0454) ?Last BM Date : 11/23/21 ? ?Weight change: ?Filed Weights  ? 11/23/21 0553 11/24/21 0432 11/24/21 0454  ?Weight: 92.9 kg 97.2 kg 92.9 kg  ? ? ?Intake/Output:  ? ?Intake/Output Summary (Last 24 hours) at 11/24/2021 0725 ?Last data filed at 11/24/2021 4193 ?Gross per 24 hour  ?Intake 780.43 ml  ?Output 1425 ml  ?Net -644.57 ml  ?  ?PHYSICAL EXAM: ?CVP 8-9  ?General:  Well appearing. NAD ?HEENT: normal ?Neck: supple. JVD 8-9 cm. Carotids 2+ bilat; no bruits. No lymphadenopathy or thyromegaly appreciated. ?Cor: PMI nondisplaced. Irregularly irregular rhythm and rate, 2/6 SEM, 2/6 diastolic murmur  ?Lungs: CTAB  ?Abdomen: soft, nontender, nondistended. No hepatosplenomegaly. No bruits or masses. Good bowel sounds. ?Extremities: no cyanosis, clubbing, rash, trace b/l pretibial LE + pedal edema edema, unna boots + RUE PICC  ?Neuro: alert & oriented x 3, cranial nerves grossly intact. moves all 4 extremities w/o difficulty. Affect pleasant. ? ? ?Telemetry:  Chronic Afib, 80s  ? ?Labs: ?Basic Metabolic Panel: ?Recent Labs  ?Lab 11/17/21 ?1155 11/18/21 ?0424 11/20/21 ?0428 11/21/21 ?0410 11/22/21 ?7902 11/23/21 ?0510 11/24/21 ?0440  ?NA 140   < > 139 136 137 136 136  ?K 4.4   < > 3.7 3.5 3.4* 3.7 4.1  ?CL 105   < > 99 94* 95* 98 96*  ?CO2 22   < > '31  30 29 29 28  '$ ?GLUCOSE 188*   < > 149* 179* 102* 67* 71  ?BUN 81*   < > 66* 78* 71* 70* 79*  ?CREATININE 3.46*   < > 3.17* 3.29* 2.69* 2.79* 2.66*  ?CALCIUM 8.9   < > 9.7 9.1 8.7* 8.6* 8.5*  ?MG 1.8  --   --   --   --   --  1.9  ? < > = values in this interval not displayed.  ? ? ?Liver Function Tests: ?Recent Labs  ?Lab 11/17/21 ?1155  ?AST 56*  ?ALT 52*  ?ALKPHOS 143*  ?BILITOT 1.5*  ?PROT 6.0*  ?ALBUMIN 3.1*  ? ?No results for input(s): LIPASE, AMYLASE in the last 168 hours. ?No results for input(s): AMMONIA in the last 168 hours. ? ?CBC: ?Recent Labs  ?Lab 11/17/21 ?1155 11/21/21 ?0410 11/22/21 ?4097 11/23/21 ?0510 11/24/21 ?0440  ?WBC 5.1 5.9 5.3 5.8 5.9  ?NEUTROABS 3.7  --   --   --   --   ?HGB 13.1 12.1* 10.9* 10.2* 10.7*  ?HCT 41.4 37.6* 33.3* 32.4* 34.5*  ?MCV 81.2 79.5* 79.1* 80.2 81.0  ?PLT 158 168 151 162 180  ? ? ?Cardiac Enzymes: ?No results for input(s): CKTOTAL, CKMB, CKMBINDEX, TROPONINI in the last 168 hours. ? ?BNP: ?BNP (last 3 results) ?Recent Labs  ?  11/17/21 ?1155  ?  BNP >4,500.0*  ? ? ?ProBNP (last 3 results) ?No results for input(s): PROBNP in the last 8760 hours. ? ? ? ?Other results: ? ?Imaging: ?No results found. ? ? ?Medications:   ? ? ?Scheduled Medications: ? Chlorhexidine Gluconate Cloth  6 each Topical Daily  ? clopidogrel  75 mg Oral Q breakfast  ? insulin aspart  0-15 Units Subcutaneous TID WC  ? insulin aspart  0-5 Units Subcutaneous QHS  ? insulin aspart  2 Units Subcutaneous TID WC  ? insulin glargine-yfgn  35 Units Subcutaneous QHS  ? metoprolol succinate  50 mg Oral Daily  ? pantoprazole  40 mg Oral Daily  ? rosuvastatin  20 mg Oral Daily  ? sodium chloride flush  10-40 mL Intracatheter Q12H  ? sodium chloride flush  3 mL Intravenous Q12H  ? torsemide  40 mg Oral Daily  ? ? ?Infusions: ? sodium chloride    ? heparin    ? magnesium sulfate bolus IVPB    ? ? ?PRN Medications: ?sodium chloride, acetaminophen, calcium carbonate, diphenhydrAMINE, ondansetron (ZOFRAN) IV, sodium  chloride flush, sodium chloride flush ? ? ?Assessment/Plan:  ? ?1. Acute on chronic systolic CHF: Last echo in 4/21 with EF 25-30%, severe LV dilation, normal RV, mild AS and AI, normal mechanical MV.  St Jude ICD. Suspect ischemic cardiomyopathy.  He is markedly volume overloaded on exam.  AKI at admission, baseline creatinine around 2.1.  Echo this admission with EF 20-25%, RV severely HK, mechanical MV with mean gradient 6, mod-severe TR, moderate AI, ?low flow/low gradient moderate-severe AS with mean gradient 15/AVA 0.96 cm^2.  TEE 3/14 showed EF around 25% but only mildly decreased RV function and trivial TR, normally functioning mechanical MV and abnormal aortic valve with moderate-severe AS and AI. He has diuresed well on milrinone 0.25 + Lasix/metolazone. Milrinone weaned off 3/16. Now on PO torsemide.  ?- Co-ox 50% off milrinone, repeat pending. SCr stable  ?- CVP 8-9. Continue Torsemide 40 mg daily  ?- Continue Toprol XL 50 mg daily.  ?- Other GDMT limited by AKI on CKD stage 3.  ?- hemodynamic R/LHC  for TAVR  (likely Monday)  ?2. CAD: s/p CABG with MVR in 2002.  Last cath 10/21 with occluded SVG-RCA, and 80% mRCA, totally occluded LAD, patent LIMA-LAD, 85% ostial LCx treated with DES.  No chest pain, doubt ACS.  ?- Continue Plavix (?transition ASA in future as >1 year post-PCI).  ?- Continue Crestor.  ?- Plan R/LHC once INR <1.8  ?3. Atrial fibrillation: Permanent. Rate controlled.  ?- Continue Toprol XL.  ?- Warfarin per pharmacy (hold for now with high INR and epistaxis).  ?- INR 2.4 today  ?- start heparin once INR < 2.0 (for cath)  ?4. 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  INR 4 on admit (goal 2.5-3.5).  ?- Warfarin dosing per pharmacy  ?5. AKI on CKD stage 3: Creatinine up to 3.46 at admission, baseline around 2.1.  Suspect cardiorenal.  Creatinine now 2.7. CVP 8-9  ?- Continue torsemide  ?- Follow co-ox off milrinone   ?6. Aortic valve disorder: Abnormal aortic valve, looks functionally bicuspid with fixed noncoronary and right coronary cusps.  Moderate-severe AS with AVA 1.2 cm^2 but DI 0.23.  There is moderate-severe AI as well.   ?- Reviewed w/ structural heart team. He may be a TAVR candidate but risk for worsening renal function with contrast is considerable. Discussed risk vs benefit w/  pt and he is agreeable to proceed w/ further w/u including R/LHC.  ?- Structural Team appreciated.   ?7. Transaminitis. Mild, likely due to passive congestion  ?- diurese ? ?Length of Stay: 7 ? ? ?Brittainy Simmons PA-C  ?11/24/2021, 7:25 AM ? ?Advanced Heart Failure Team ?Pager [phone] (M-F; 7a - 4p)  ?Please contact [facility] for night-coverage after hours (4p -7a ) and weekends on [email or web address] ? ?Patient seen with PA, agree with the above note.  ?  ?Weight stable but CVP up to 12.  Creatinine fairly stable at 2.79 => 2.66. INR 2.4 today.  Co-ox 68% (repeat) off milrinone.  ?  ?General: NAD ?Neck: JVP 10-12 cm, no thyromegaly or thyroid nodule.  ?Lungs: Clear to auscultation bilaterally with normal respiratory effort. ?CV: Nondisplaced PMI.  Heart irregular S1/S2 with mechanical S1, no S3/S4, 2/6 SEM RUSB, 2/6 diastolic murmur.  1+ foot edema.   ?Abdomen: Soft, nontender, no hepatosplenomegaly, no distention.  ?Skin: Intact without lesions or rashes.  ?Neurologic: Alert and oriented x 3.  ?Psych: Normal affect. ?Extremities: No clubbing or cyanosis.  ?HEENT: Normal.  ?  ?Co-ox adequate at 68% off milrinone today.  No additional GDMT with SBP 90s and creatinine 2.66.  CVP 12, will increase torsemide to 60 mg daily.  ?  ?Considering high risk TAVR, has been seen by structural heart team.  INR 2.4 today, when INR < 2, will plan hemodynamic R/LHC with crossing of aortic valve for full aortic valve assessment => aim for Monday with high INR today. Discussed with patient.  ? ?With mechanical mitral valve, will start heparin today now that INR  < 2.5.  ? ?Walk in hall.  ? ?Loralie Champagne ?11/24/2021 ?8:28 AM ? ?

## 2021-11-25 DIAGNOSIS — I5023 Acute on chronic systolic (congestive) heart failure: Secondary | ICD-10-CM | POA: Diagnosis not present

## 2021-11-25 LAB — GLUCOSE, CAPILLARY
Glucose-Capillary: 63 mg/dL — ABNORMAL LOW (ref 70–99)
Glucose-Capillary: 129 mg/dL — ABNORMAL HIGH (ref 70–99)
Glucose-Capillary: 105 mg/dL — ABNORMAL HIGH (ref 70–99)
Glucose-Capillary: 173 mg/dL — ABNORMAL HIGH (ref 70–99)
Glucose-Capillary: 214 mg/dL — ABNORMAL HIGH (ref 70–99)

## 2021-11-25 LAB — COOXEMETRY PANEL
Carboxyhemoglobin: 0.8 % (ref 0.5–1.5)
Carboxyhemoglobin: 0.8 % (ref 0.5–1.5)
Methemoglobin: <0.7 % (ref 0.0–1.5)
Methemoglobin: <0.7 % (ref 0.0–1.5)
O2 Saturation: 49.6 %
O2 Saturation: 54.1 %
Total hemoglobin: 11.3 g/dL — ABNORMAL LOW (ref 12.0–16.0)
Total hemoglobin: 11 g/dL — ABNORMAL LOW (ref 12.0–16.0)

## 2021-11-25 LAB — MAGNESIUM: Magnesium: 2.2 mg/dL (ref 1.7–2.4)

## 2021-11-25 LAB — BASIC METABOLIC PANEL
Anion gap: 12 (ref 5–15)
BUN: 88 mg/dL — ABNORMAL HIGH (ref 8–23)
CO2: 27 mmol/L (ref 22–32)
Calcium: 8.6 mg/dL — ABNORMAL LOW (ref 8.9–10.3)
Chloride: 97 mmol/L — ABNORMAL LOW (ref 98–111)
Creatinine, Ser: 2.84 mg/dL — ABNORMAL HIGH (ref 0.61–1.24)
GFR, Estimated: 23 mL/min — ABNORMAL LOW (ref >60)
Glucose, Bld: 64 mg/dL — ABNORMAL LOW (ref 70–99)
Potassium: 4 mmol/L (ref 3.5–5.1)
Sodium: 136 mmol/L (ref 135–145)

## 2021-11-25 LAB — CBC
HCT: 34.3 % — ABNORMAL LOW (ref 39.0–52.0)
Hemoglobin: 11 g/dL — ABNORMAL LOW (ref 13.0–17.0)
MCH: 25.7 pg — ABNORMAL LOW (ref 26.0–34.0)
MCHC: 32.1 g/dL (ref 30.0–36.0)
MCV: 80.1 fL (ref 80.0–100.0)
Platelets: 201 10*3/uL (ref 150–400)
RBC: 4.28 MIL/uL (ref 4.22–5.81)
RDW: 21.2 % — ABNORMAL HIGH (ref 11.5–15.5)
WBC: 4.9 10*3/uL (ref 4.0–10.5)
nRBC: 0 % (ref 0.0–0.2)

## 2021-11-25 LAB — HEPARIN LEVEL (UNFRACTIONATED): Heparin Unfractionated: 0.42 IU/mL (ref 0.30–0.70)

## 2021-11-25 LAB — PROTIME-INR
INR: 2.1 — ABNORMAL HIGH (ref 0.8–1.2)
Prothrombin Time: 23.1 seconds — ABNORMAL HIGH (ref 11.4–15.2)

## 2021-11-25 NOTE — Plan of Care (Signed)

## 2021-11-25 NOTE — Progress Notes (Signed)
Patient ID: John [name], male   DOB: 12/23/1950, 71 y.o.   MRN: [id number] ? ? ? ?Advanced Heart Failure Rounding Note ? ? ?Subjective:   ? ?Milrinone discontinued 3/16.  ? ?Co-ox 49.9% -> 68% yesterday  ?Am co-ox back down to 50% today  ? ?Scr stable, 2.69>>2.79>>2.66-> 2.84 ? ?On po diuretics. Weight stable.  ? ?Denies CP or SOB  Walked hall this am. INR 2.1 On heparin for mechanical MVR No bleeding ? ?Objective:   ?Weight Range: ? ?Vital Signs:   ?Temp:  [97.3 ?F (36.3 ?C)-98.5 ?F (36.9 ?C)] 97.3 ?F (36.3 ?C) (03/18 0725) ?Pulse Rate:  [76-82] 76 (03/18 0725) ?Resp:  [18-19] 19 (03/18 0725) ?BP: (96-108)/(56-64) 99/58 (03/18 0725) ?SpO2:  [93 %-99 %] 98 % (03/18 0725) ?Weight:  [92.8 kg] 92.8 kg (03/18 0415) ?Last BM Date : 11/24/21 ? ?Weight change: ?Filed Weights  ? 11/24/21 0432 11/24/21 0454 11/25/21 0415  ?Weight: 97.2 kg 92.9 kg 92.8 kg  ? ? ?Intake/Output:  ? ?Intake/Output Summary (Last 24 hours) at 11/25/2021 0849 ?Last data filed at 11/25/2021 0847 ?Gross per 24 hour  ?Intake 1366.91 ml  ?Output 1475 ml  ?Net -108.09 ml  ? ?  ?PHYSICAL EXAM: ?General:  Sitting up in bed No resp difficulty ?HEENT: normal ?Neck: supple. no JVD. Carotids 2+ bilat; no bruits. No lymphadenopathy or thryomegaly appreciated. ?Cor: PMI nondisplaced. Irregular rate & rhythm.2/6 AS/AI Mechanical s1 ?Lungs: clear ?Abdomen: soft, nontender, nondistended. No hepatosplenomegaly. No bruits or masses. Good bowel sounds. ?Extremities: no cyanosis, clubbing, rash, edema + UNNA ?Neuro: alert & orientedx3, cranial nerves grossly intact. moves all 4 extremities w/o difficulty. Affect pleasant ? ? ?Telemetry:  Chronic Afib, 70-80s  Personally reviewed ? ? ?Labs: ?Basic Metabolic Panel: ?Recent Labs  ?Lab 11/21/21 ?0410 11/22/21 ?3825 11/23/21 ?0510 11/24/21 ?0440 11/25/21 ?0425  ?NA 136 137 136 136 136  ?K 3.5 3.4* 3.7 4.1 4.0  ?CL 94* 95* 98 96* 97*  ?CO2 '30 29 29 28 27  '$ ?GLUCOSE 179* 102* 67* 71 64*  ?BUN 78* 71* 70* 79* 88*  ?CREATININE  3.29* 2.69* 2.79* 2.66* 2.84*  ?CALCIUM 9.1 8.7* 8.6* 8.5* 8.6*  ?MG  --   --   --  1.9 2.2  ? ? ? ?Liver Function Tests: ?No results for input(s): AST, ALT, ALKPHOS, BILITOT, PROT, ALBUMIN in the last 168 hours. ? ?No results for input(s): LIPASE, AMYLASE in the last 168 hours. ?No results for input(s): AMMONIA in the last 168 hours. ? ?CBC: ?Recent Labs  ?Lab 11/21/21 ?0410 11/22/21 ?0539 11/23/21 ?0510 11/24/21 ?0440 11/25/21 ?0425  ?WBC 5.9 5.3 5.8 5.9 4.9  ?HGB 12.1* 10.9* 10.2* 10.7* 11.0*  ?HCT 37.6* 33.3* 32.4* 34.5* 34.3*  ?MCV 79.5* 79.1* 80.2 81.0 80.1  ?PLT 168 151 162 180 201  ? ? ? ?Cardiac Enzymes: ?No results for input(s): CKTOTAL, CKMB, CKMBINDEX, TROPONINI in the last 168 hours. ? ?BNP: ?BNP (last 3 results) ?Recent Labs  ?  11/17/21 ?1155  ?BNP >4,500.0*  ? ? ? ?ProBNP (last 3 results) ?No results for input(s): PROBNP in the last 8760 hours. ? ? ? ?Other results: ? ?Imaging: ?No results found. ? ? ?Medications:   ? ? ?Scheduled Medications: ? Chlorhexidine Gluconate Cloth  6 each Topical Daily  ? clopidogrel  75 mg Oral Q breakfast  ? insulin aspart  0-15 Units Subcutaneous TID WC  ? insulin aspart  0-5 Units Subcutaneous QHS  ? insulin aspart  2 Units Subcutaneous TID WC  ? insulin  glargine-yfgn  35 Units Subcutaneous QHS  ? metoprolol succinate  50 mg Oral Daily  ? pantoprazole  40 mg Oral Daily  ? rosuvastatin  20 mg Oral Daily  ? sodium chloride flush  10-40 mL Intracatheter Q12H  ? sodium chloride flush  3 mL Intravenous Q12H  ? torsemide  60 mg Oral Daily  ? ? ?Infusions: ? sodium chloride    ? heparin 1,550 Units/hr (11/24/21 2041)  ? ? ?PRN Medications: ?sodium chloride, acetaminophen, calcium carbonate, diphenhydrAMINE, ondansetron (ZOFRAN) IV, sodium chloride flush, sodium chloride flush ? ? ?Assessment/Plan:  ? ?1. Acute on chronic systolic CHF: Last echo in 4/21 with EF 25-30%, severe LV dilation, normal RV, mild AS and AI, normal mechanical MV.  St Jude ICD. Suspect ischemic  cardiomyopathy.  He is markedly volume overloaded on exam.  AKI at admission, baseline creatinine around 2.1.  Echo this admission with EF 20-25%, RV severely HK, mechanical MV with mean gradient 6, mod-severe TR, moderate AI, ?low flow/low gradient moderate-severe AS with mean gradient 15/AVA 0.96 cm^2.  TEE 3/14 showed EF around 25% but only mildly decreased RV function and trivial TR, normally functioning mechanical MV and abnormal aortic valve with moderate-severe AS and AI. He has diuresed well on milrinone 0.25 + Lasix/metolazone. Milrinone weaned off 3/16. Now on PO torsemide.  ?- Co-ox has been mariginal off milrinone. Am co-ox 50% repeat pending. SCr stable  ?- Volume status stable  Continue Torsemide 60 mg daily  ?- Continue Toprol XL 50 mg daily.  ?- Other GDMT limited by AKI on CKD stage 3.  ?- hemodynamic R/LHC  for possible TAVR  (likely Monday)  ?2. CAD: s/p CABG with MVR in 2002.  Last cath 10/21 with occluded SVG-RCA, and 80% mRCA, totally occluded LAD, patent LIMA-LAD, 85% ostial LCx treated with DES.  No chest pain, doubt ACS.  ?- Continue Plavix (?transition ASA in future as >1 year post-PCI).  ?- Continue Crestor.  ?- Plan R/LHC once INR <1.8  INR 2.1 today. Holding warfarin ?3. Atrial fibrillation: Permanent. Rate controlled.  ?- Continue Toprol XL.  ?- Warfarin per pharmacy (hold for now with high INR and epistaxis and plan for cath).  ?- INR 2.1 today  ?- On heparin with MVR ?4. 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  INR 4 on admit (goal 2.5-3.5).  ?-  INR 2.1 on heparin with MVR ?5. AKI on CKD stage 3: Creatinine up to 3.46 at admission, baseline around 2.1.  Suspect cardiorenal.  Creatinine now 2.8. ?- Follow co-ox off milrinone  ?6. Aortic valve disorder: Abnormal aortic valve, looks functionally bicuspid with fixed noncoronary and right coronary cusps.  Moderate-severe AS with AVA 1.2 cm^2 but DI 0.23.  There is  moderate-severe AI as well.   ?- Reviewed w/ structural heart team. He may be a TAVR candidate but risk for worsening renal function with contrast is considerable. Dr. [name] discussed risk vs benefit w/ pt and he is agreeable to proceed w/ further w/u including hemodynamic R/LHC.  ?- Structural Team appreciated.   ?7. Transaminitis. Mild, likely due to passive congestion  ?- improved ? ?Length of Stay: 8 ? ? ?Glori Bickers MD  ?11/25/2021, 8:49 AM ? ?Advanced Heart Failure Team ?Pager [phone] (M-F; 7a - 4p)  ?Please contact [facility] for night-coverage after hours (4p -7a ) and weekends on [email or web address] ? ? ?

## 2021-11-25 NOTE — Progress Notes (Signed)
CBG 63, asymptomatic. Juice given with CBG increasing to 129. ?

## 2021-11-25 NOTE — Progress Notes (Signed)
CARDIAC REHAB PHASE I  ? ?PRE:  Rate/Rhythm: 83 SR ? ?BP:  Supine:   Sitting: 103/50  Standing:  ?  SaO2: 99% RA ? ?MODE:  Ambulation: 1000 ft  ? ?POST:  Rate/Rhythm: 102 Afib ? ?BP:  Supine:   Sitting: 95/74  Standing:  ?  SaO2: 100% RA ? ?0919-1000 Patient tolerated ambulation well with assist x1 and pushing IV pole. Gait, slow, steady multiple standing rest breaks taken. BP low after walk, asymptomatic.  ? ?Sol Passer, MS, ACSM CEP ? ? ? ?

## 2021-11-25 NOTE — Progress Notes (Signed)
ANTICOAGULATION CONSULT NOTE - Follow Up Consult ? ?Pharmacy Consult for warfarin >> heparin ?Indication:  mechanical MVR ? ?Allergies  ?Allergen Reactions  ? Allopurinol Shortness Of Breath  ? Doxycycline Hives  ? Wilder Glade [Dapagliflozin]   ?  Can not tolerate - dizzy weak almost passed out  ? ? ?Patient Measurements: ?Height: '5\' 11"'$  (180.3 cm) ?Weight: 92.8 kg (204 lb 8 oz) ?IBW/kg (Calculated) : 75.3 ?Heparin Dosing Weight: 92.9 kg ? ?Vital Signs: ?Temp: 97.3 ?F (36.3 ?C) (03/18 0725) ?Temp Source: Oral (03/18 0725) ?BP: 99/58 (03/18 0725) ?Pulse Rate: 76 (03/18 0725) ? ?Labs: ?Recent Labs  ?  11/23/21 ?0510 11/24/21 ?0440 11/24/21 ?1317 11/24/21 ?1405 11/24/21 ?2051 11/25/21 ?0425 11/25/21 ?0430  ?HGB 10.2* 10.7*  --   --   --  11.0*  --   ?HCT 32.4* 34.5*  --   --   --  34.3*  --   ?PLT 162 180  --   --   --  201  --   ?LABPROT 32.4* 25.9*  --   --   --   --  23.1*  ?INR 3.2* 2.4*  --   --   --   --  2.1*  ?HEPARINUNFRC  --   --    < > 0.33 0.35  --  0.42  ?CREATININE 2.79* 2.66*  --   --   --  2.84*  --   ? < > = values in this interval not displayed.  ? ? ? ?Estimated Creatinine Clearance: 29 mL/min (A) (by C-G formula based on SCr of 2.84 mg/dL (H)). ? ? ?Medical History: ?Past Medical History:  ?Diagnosis Date  ? Abnormal nuclear stress test 05/10/2017  ? Allergy   ? Anemia   ? "lost blood w/the cancer"  ? Arthritis   ? back   ? Atrial fibrillation ([place])   ? takes Coumadin daily  ? Bruises easily   ? takes Coumadin daily  ? CAD (coronary artery disease)   ? a. s/p CABG 11/2000 (L-LAD, S-RCA);  b. Lexiscan Myoview 11/12: EF 45%, ischemia and possibly some scar at the base of the inferolateral wall;   c. Lex MV 11/13:  EF 44%, Inf and IL scar/soft tissue atten, small mild amt of inf ischemia,  ? CHF (congestive heart failure) ([place])   ? Constipation   ? takes Colace daily  ? 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 Enlarged prostate   ? Flu 09/2013  ? GERD (gastroesophageal reflux disease)   ? takes Nexium  daily  ? Glaucoma   ? History of blood transfusion   ? "related to cancer"  ? History of colon polyps   ? HLD (hyperlipidemia)   ? takes Vytorin daily  ? HTN (hypertension)   ? takes Amlodipine,Metoprolol, and Ramipril daily  ? Insomnia   ? states he has always been like this.But doesn't take any meds  ? Kidney stones   ? "I passed them all"  ? Nocturia   ? Peripheral edema   ? takes Furosemide daily  ? Peripheral neuropathy   ? Rheumatic fever ~ 1965  ? Rheumatic heart disease   ? a. s/p mechanical (St. Jude) MVR 2002;  b. Echo 5/11: Mild LVH, EF 45-50%, mild AS, mild AI, mean gradient 11 mmHg, MVR with normal gradients, moderate LAE, mild RAE;  c.  Echo 11/13:   mod LVH, EF 55-60%, mild AS (mean 18 mmHg), mild AI, severe LAE, mild RAE, PASP 41  ? 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 SOB (shortness of breath)   ? with exertion  ? Type II diabetes mellitus ([place])   ? takes Januvia,Glipizide,and Metformin daily as well as Lantus  ? Unstable angina ([place]) 06/21/2020  ? ? ?Medications:  ?Infusions:  ? sodium chloride    ? heparin 1,550 Units/hr (11/24/21 2041)  ? ? ?Assessment: ?71 yo M on warfarin PTA for mechanical MVR (INR goal 2.5-3.5). Warfarin has been held  in preparation for RHC/LHC for TAVR work-up. Pharmacy has been consulted to start heparin when INR < 2.5. INR 2.4 on 3/17 and heparin was resumed. INR this morning is 2.1 and Heparin level is therapeutic at 0.42. CBC stable with hgb at 11 and plts 201.  ? ?Heparin level this afternoon on first check therapeutic at 0.35 on 1550 units/hr. No s/x of bleeding per RN.  ? ?PTA warfarin dose: 5 mg/day for 5 days then 2.5 x2 days then repeat ? ?Goal of Therapy:  ?Heparin level 0.3-0.7 units/ml ?Monitor platelets by anticoagulation protocol: Yes ?  ?Plan:  ?Continue heparin gtt at 1550 units/hr ?Heparin level daily and CBC daily while on heparin ?Follow-up transition back to warfarin post-cath ? ? ?Thank you for allowing pharmacy  to participate in this patient's care. ? ?Reatha Harps, PharmD ?PGY1 Pharmacy Resident ?11/25/2021 7:45 AM ?Check [email or web address] for unit specific pharmacy number ? ? ? ?

## 2021-11-26 DIAGNOSIS — I5023 Acute on chronic systolic (congestive) heart failure: Secondary | ICD-10-CM | POA: Diagnosis not present

## 2021-11-26 LAB — PROTIME-INR
INR: 2 — ABNORMAL HIGH (ref 0.8–1.2)
Prothrombin Time: 22.4 seconds — ABNORMAL HIGH (ref 11.4–15.2)

## 2021-11-26 LAB — CBC
HCT: 34.4 % — ABNORMAL LOW (ref 39.0–52.0)
Hemoglobin: 10.8 g/dL — ABNORMAL LOW (ref 13.0–17.0)
MCH: 25.5 pg — ABNORMAL LOW (ref 26.0–34.0)
MCHC: 31.4 g/dL (ref 30.0–36.0)
MCV: 81.3 fL (ref 80.0–100.0)
Platelets: 213 10*3/uL (ref 150–400)
RBC: 4.23 MIL/uL (ref 4.22–5.81)
RDW: 21.1 % — ABNORMAL HIGH (ref 11.5–15.5)
WBC: 4.8 10*3/uL (ref 4.0–10.5)
nRBC: 0 % (ref 0.0–0.2)

## 2021-11-26 LAB — COOXEMETRY PANEL
Carboxyhemoglobin: <0.3 % — ABNORMAL LOW (ref 0.5–1.5)
Carboxyhemoglobin: 0.7 % (ref 0.5–1.5)
Methemoglobin: <0.7 % (ref 0.0–1.5)
Methemoglobin: <0.7 % (ref 0.0–1.5)
O2 Saturation: 41.4 %
O2 Saturation: 66.2 %
Total hemoglobin: 11.2 g/dL — ABNORMAL LOW (ref 12.0–16.0)
Total hemoglobin: 10.9 g/dL — ABNORMAL LOW (ref 12.0–16.0)

## 2021-11-26 LAB — GLUCOSE, CAPILLARY
Glucose-Capillary: 103 mg/dL — ABNORMAL HIGH (ref 70–99)
Glucose-Capillary: 61 mg/dL — ABNORMAL LOW (ref 70–99)
Glucose-Capillary: 68 mg/dL — ABNORMAL LOW (ref 70–99)
Glucose-Capillary: 119 mg/dL — ABNORMAL HIGH (ref 70–99)
Glucose-Capillary: 158 mg/dL — ABNORMAL HIGH (ref 70–99)
Glucose-Capillary: 204 mg/dL — ABNORMAL HIGH (ref 70–99)

## 2021-11-26 LAB — BASIC METABOLIC PANEL
Anion gap: 12 (ref 5–15)
BUN: 98 mg/dL — ABNORMAL HIGH (ref 8–23)
CO2: 27 mmol/L (ref 22–32)
Calcium: 8.3 mg/dL — ABNORMAL LOW (ref 8.9–10.3)
Chloride: 98 mmol/L (ref 98–111)
Creatinine, Ser: 3.04 mg/dL — ABNORMAL HIGH (ref 0.61–1.24)
GFR, Estimated: 22 mL/min — ABNORMAL LOW (ref >60)
Glucose, Bld: 68 mg/dL — ABNORMAL LOW (ref 70–99)
Potassium: 3.9 mmol/L (ref 3.5–5.1)
Sodium: 137 mmol/L (ref 135–145)

## 2021-11-26 LAB — MAGNESIUM: Magnesium: 2.4 mg/dL (ref 1.7–2.4)

## 2021-11-26 LAB — HEPARIN LEVEL (UNFRACTIONATED): Heparin Unfractionated: 0.41 IU/mL (ref 0.30–0.70)

## 2021-11-26 MED ORDER — FUROSEMIDE 10 MG/ML IJ SOLN
80.0000 mg | Freq: Two times a day (BID) | INTRAMUSCULAR | Status: DC
  Administered 2021-11-26 – 2021-11-30 (×8): 80 mg via INTRAVENOUS
  Filled 2021-11-26 (×8): qty 8

## 2021-11-26 MED ORDER — FUROSEMIDE 10 MG/ML IJ SOLN
80.0000 mg | Freq: Once | INTRAMUSCULAR | Status: AC
  Administered 2021-11-26: 80 mg via INTRAVENOUS
  Filled 2021-11-26: qty 8

## 2021-11-26 MED ORDER — ASPIRIN 81 MG PO CHEW
81.0000 mg | CHEWABLE_TABLET | ORAL | Status: AC
  Administered 2021-11-27: 81 mg via ORAL
  Filled 2021-11-26: qty 1

## 2021-11-26 MED ORDER — SODIUM CHLORIDE 0.9% FLUSH
3.0000 mL | Freq: Two times a day (BID) | INTRAVENOUS | Status: DC
  Administered 2021-11-27 – 2021-12-02 (×5): 3 mL via INTRAVENOUS

## 2021-11-26 MED ORDER — SODIUM CHLORIDE 0.9% FLUSH: 3.0000 mL | INTRAVENOUS | Status: DC | PRN

## 2021-11-26 MED ORDER — SODIUM CHLORIDE 0.9 % IV SOLN: 250.0000 mL | INTRAVENOUS | Status: DC | PRN

## 2021-11-26 MED ORDER — INSULIN GLARGINE-YFGN 100 UNIT/ML ~~LOC~~ SOLN
28.0000 [IU] | Freq: Every day | SUBCUTANEOUS | Status: DC
  Administered 2021-11-26 – 2021-11-27 (×2): 28 [IU] via SUBCUTANEOUS
  Filled 2021-11-26 (×3): qty 0.28

## 2021-11-26 MED ORDER — SODIUM CHLORIDE 0.9 % IV SOLN: INTRAVENOUS | Status: DC

## 2021-11-26 NOTE — Progress Notes (Addendum)
Patient ID: John [name], male   DOB: 03/28/1951, 71 y.o.   MRN: [id number] ? ? ? ?Advanced Heart Failure Rounding Note ? ? ?Subjective:   ? ?Milrinone discontinued 3/16.  ? ?Co-ox has been quite labile off milrinone early am co-ox in 34s. F/u typically in 67s.  ? ?Today co-ox 41.4-> 66.2%  On torsemide 40 daily ? ?Scr rising  2.69>>2.79>>2.66-> 2.84 -> 3.04  CVP 16  ? ?Feels ok. Denies SOB, orthopnea or PND. Feet feel swollen ? ? Asking about cath. Says he doesn;t want to lose his kidneys.  ? ? ?Objective:   ?Weight Range: ? ?Vital Signs:   ?Temp:  [97.3 ?F (36.3 ?C)-98.8 ?F (37.1 ?C)] 97.3 ?F (36.3 ?C) (03/19 6812) ?Pulse Rate:  [77-104] 104 (03/19 0853) ?Resp:  [18-19] 19 (03/19 0853) ?BP: (96-111)/(55-63) 108/63 (03/19 0853) ?SpO2:  [98 %-100 %] 98 % (03/19 0853) ?Weight:  [92.6 kg] 92.6 kg (03/19 0352) ?Last BM Date : 11/25/21 ? ?Weight change: ?Filed Weights  ? 11/24/21 0454 11/25/21 0415 11/26/21 0352  ?Weight: 92.9 kg 92.8 kg 92.6 kg  ? ? ?Intake/Output:  ? ?Intake/Output Summary (Last 24 hours) at 11/26/2021 1119 ?Last data filed at 11/26/2021 0853 ?Gross per 24 hour  ?Intake 1205.86 ml  ?Output 800 ml  ?Net 405.86 ml  ? ?  ?PHYSICAL EXAM: ?General:  Sitting up in bed No resp difficulty ?HEENT: normal ?Neck: supple. JVP to ear . Carotids 2+ bilat; no bruits. No lymphadenopathy or thryomegaly appreciated. ?Cor: PMI nondisplaced. Irregular rate & rhythm. Mechanical s1  2/6 AS ?Lungs: clear ?Abdomen: soft, nontender, nondistended. No hepatosplenomegaly. No bruits or masses. Good bowel sounds. ?Extremities: no cyanosis, clubbing, rash, 1+ edema + UNNA ?Neuro: alert & orientedx3, cranial nerves grossly intact. moves all 4 extremities w/o difficulty. Affect pleasant ? ? ? ?Telemetry:  Chronic Afib, 90-100  Personally reviewed ? ? ?Labs: ?Basic Metabolic Panel: ?Recent Labs  ?Lab 11/22/21 ?0443 11/23/21 ?0510 11/24/21 ?0440 11/25/21 ?0425 11/26/21 ?0410  ?NA 137 136 136 136 137  ?K 3.4* 3.7 4.1 4.0 3.9  ?CL 95* 98  96* 97* 98  ?CO2 '29 29 28 27 27  '$ ?GLUCOSE 102* 67* 71 64* 68*  ?BUN 71* 70* 79* 88* 98*  ?CREATININE 2.69* 2.79* 2.66* 2.84* 3.04*  ?CALCIUM 8.7* 8.6* 8.5* 8.6* 8.3*  ?MG  --   --  1.9 2.2 2.4  ? ? ? ?Liver Function Tests: ?No results for input(s): AST, ALT, ALKPHOS, BILITOT, PROT, ALBUMIN in the last 168 hours. ? ?No results for input(s): LIPASE, AMYLASE in the last 168 hours. ?No results for input(s): AMMONIA in the last 168 hours. ? ?CBC: ?Recent Labs  ?Lab 11/22/21 ?0443 11/23/21 ?0510 11/24/21 ?0440 11/25/21 ?0425 11/26/21 ?0410  ?WBC 5.3 5.8 5.9 4.9 4.8  ?HGB 10.9* 10.2* 10.7* 11.0* 10.8*  ?HCT 33.3* 32.4* 34.5* 34.3* 34.4*  ?MCV 79.1* 80.2 81.0 80.1 81.3  ?PLT 151 162 180 201 213  ? ? ? ?Cardiac Enzymes: ?No results for input(s): CKTOTAL, CKMB, CKMBINDEX, TROPONINI in the last 168 hours. ? ?BNP: ?BNP (last 3 results) ?Recent Labs  ?  11/17/21 ?1155  ?BNP >4,500.0*  ? ? ? ?ProBNP (last 3 results) ?No results for input(s): PROBNP in the last 8760 hours. ? ? ? ?Other results: ? ?Imaging: ?No results found. ? ? ?Medications:   ? ? ?Scheduled Medications: ? Chlorhexidine Gluconate Cloth  6 each Topical Daily  ? clopidogrel  75 mg Oral Q breakfast  ? insulin aspart  0-15 Units Subcutaneous  TID WC  ? insulin aspart  0-5 Units Subcutaneous QHS  ? insulin aspart  2 Units Subcutaneous TID WC  ? insulin glargine-yfgn  35 Units Subcutaneous QHS  ? pantoprazole  40 mg Oral Daily  ? rosuvastatin  20 mg Oral Daily  ? sodium chloride flush  10-40 mL Intracatheter Q12H  ? sodium chloride flush  3 mL Intravenous Q12H  ? ? ?Infusions: ? sodium chloride    ? heparin 1,550 Units/hr (11/26/21 0418)  ? ? ?PRN Medications: ?sodium chloride, acetaminophen, calcium carbonate, diphenhydrAMINE, ondansetron (ZOFRAN) IV, sodium chloride flush, sodium chloride flush ? ? ?Assessment/Plan:  ? ?1. Acute on chronic systolic CHF: Last echo in 4/21 with EF 25-30%, severe LV dilation, normal RV, mild AS and AI, normal mechanical MV.  St Jude ICD.  Suspect ischemic cardiomyopathy.  He is markedly volume overloaded on exam.  AKI at admission, baseline creatinine around 2.1.  Echo this admission with EF 20-25%, RV severely HK, mechanical MV with mean gradient 6, mod-severe TR, moderate AI, ?low flow/low gradient moderate-severe AS with mean gradient 15/AVA 0.96 cm^2.  TEE 3/14 showed EF around 25% but only mildly decreased RV function and trivial TR, normally functioning mechanical MV and abnormal aortic valve with moderate-severe AS and AI. He has diuresed well on milrinone 0.25 + Lasix/metolazone. Milrinone weaned off 3/16. Co-ox has been quite labile off milrinone early am co-ox in 28s. F/u typically in 83s. Today co-ox 41.4-> 66.2%. Scr worse. Weight stable at 204 CVP 16  ?- Will keep off milrinone for now. Stop Toprol  ?- Volume status elevated. Restart IV lasix. If minimal output will restart milrinone ?- Other GDMT limited by AKI on CKD stage 3.  ?- hemodynamic R/LHC  for possible TAVR scheduled for tomorrow. May not be ready.  ?2. CAD: s/p CABG with MVR in 2002.  Last cath 10/21 with occluded SVG-RCA, and 80% mRCA, totally occluded LAD, patent LIMA-LAD, 85% ostial LCx treated with DES.  No chest pain, doubt ACS.  ?- Continue Plavix (?transition ASA in future as >1 year post-PCI).  ?- Continue Crestor.  ?- Plan R/LHC once INR <1.8  INR 2.0 today. Holding warfarin. On heparin with MVR ?3. Atrial fibrillation: Permanent. Rate controlled.  ?- Hold Toprol XL with labile co-ox. Watch AF rate.  ?- Warfarin per pharmacy (hold for now with high INR and epistaxis and plan for cath).  ?- INR 2.0 today  ?- On heparin with MVR. No obvious bleeding ?4. 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  INR 4 on admit (goal 2.5-3.5).  ?-  INR 2.0 on heparin with MVR ?5. AKI on CKD stage 3: Creatinine up to 3.46 at admission, baseline around 2.1.  Suspect cardiorenal.  Creatinine now 3.0.  ?-Plan as  above ?6. Aortic valve disorder: Abnormal aortic valve, looks functionally bicuspid with fixed noncoronary and right coronary cusps.  Moderate-severe AS with AVA 1.2 cm^2 but DI 0.23.  There is moderate-severe AI as well.   ?- Reviewed w/ structural heart team. He may be a TAVR candidate but risk for worsening renal function with contrast is considerable. Dr. [name] discussed risk vs benefit w/ pt and he is agreeable to proceed w/ further w/u including hemodynamic R/LHC.  ?- Structural Team appreciated.   ?7. Transaminitis. Mild, likely due to passive congestion  ?- improved ?8. DM2 ?- sugars running low. Cut back insulin.  ? ?Length of Stay: 9 ? ? ?Vicktoria Muckey  MD  ?11/26/2021, 11:19 AM ? ?Advanced Heart Failure Team ?Pager [phone] (M-F; 7a - 4p)  ?Please contact [facility] for night-coverage after hours (4p -7a ) and weekends on [email or web address] ? ? ?

## 2021-11-26 NOTE — Progress Notes (Signed)
CBG 61, asymptomatic. Grape juice given with increase CBG to 103. ?

## 2021-11-26 NOTE — Plan of Care (Signed)

## 2021-11-26 NOTE — Progress Notes (Signed)
ANTICOAGULATION CONSULT NOTE - Follow Up Consult ? ?Pharmacy Consult for warfarin >> heparin ?Indication:  mechanical MVR ? ?Allergies  ?Allergen Reactions  ? Allopurinol Shortness Of Breath  ? Doxycycline Hives  ? Wilder Glade [Dapagliflozin]   ?  Can not tolerate - dizzy weak almost passed out  ? ? ?Patient Measurements: ?Height: '5\' 11"'$  (180.3 cm) ?Weight: 92.6 kg (204 lb 1.6 oz) ?IBW/kg (Calculated) : 75.3 ?Heparin Dosing Weight: 92.9 kg ? ?Vital Signs: ?Temp: 97.5 ?F (36.4 ?C) (03/19 0355) ?Temp Source: Oral (03/19 0355) ?BP: 111/62 (03/19 0355) ?Pulse Rate: 82 (03/19 0355) ? ?Labs: ?Recent Labs  ?  11/24/21 ?0440 11/24/21 ?1317 11/24/21 ?2051 11/25/21 ?0425 11/25/21 ?0430 11/26/21 ?0355 11/26/21 ?0410  ?HGB 10.7*  --   --  11.0*  --   --  10.8*  ?HCT 34.5*  --   --  34.3*  --   --  34.4*  ?PLT 180  --   --  201  --   --  213  ?LABPROT 25.9*  --   --   --  23.1*  --  22.4*  ?INR 2.4*  --   --   --  2.1*  --  2.0*  ?HEPARINUNFRC  --    < > 0.35  --  0.42 0.41  --   ?CREATININE 2.66*  --   --  2.84*  --   --  3.04*  ? < > = values in this interval not displayed.  ? ? ? ?Estimated Creatinine Clearance: 27 mL/min (A) (by C-G formula based on SCr of 3.04 mg/dL (H)). ? ? ?Medical History: ?Past Medical History:  ?Diagnosis Date  ? Abnormal nuclear stress test 05/10/2017  ? Allergy   ? Anemia   ? "lost blood w/the cancer"  ? Arthritis   ? back   ? Atrial fibrillation ([place])   ? takes Coumadin daily  ? Bruises easily   ? takes Coumadin daily  ? CAD (coronary artery disease)   ? a. s/p CABG 11/2000 (L-LAD, S-RCA);  b. Lexiscan Myoview 11/12: EF 45%, ischemia and possibly some scar at the base of the inferolateral wall;   c. Lex MV 11/13:  EF 44%, Inf and IL scar/soft tissue atten, small mild amt of inf ischemia,  ? CHF (congestive heart failure) ([place])   ? Constipation   ? takes Colace daily  ? 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 Enlarged prostate   ? Flu 09/2013  ? GERD (gastroesophageal reflux disease)   ? takes  Nexium daily  ? Glaucoma   ? History of blood transfusion   ? "related to cancer"  ? History of colon polyps   ? HLD (hyperlipidemia)   ? takes Vytorin daily  ? HTN (hypertension)   ? takes Amlodipine,Metoprolol, and Ramipril daily  ? Insomnia   ? states he has always been like this.But doesn't take any meds  ? Kidney stones   ? "I passed them all"  ? Nocturia   ? Peripheral edema   ? takes Furosemide daily  ? Peripheral neuropathy   ? Rheumatic fever ~ 1965  ? Rheumatic heart disease   ? a. s/p mechanical (St. Jude) MVR 2002;  b. Echo 5/11: Mild LVH, EF 45-50%, mild AS, mild AI, mean gradient 11 mmHg, MVR with normal gradients, moderate LAE, mild RAE;  c.  Echo 11/13:   mod LVH, EF 55-60%, mild AS (mean 18 mmHg), mild AI, severe LAE, mild RAE, PASP 41  ? 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 SOB (shortness of breath)   ? with exertion  ? Type II diabetes mellitus ([place])   ? takes Januvia,Glipizide,and Metformin daily as well as Lantus  ? Unstable angina ([place]) 06/21/2020  ? ? ?Medications:  ?Infusions:  ? sodium chloride    ? heparin 1,550 Units/hr (11/26/21 0418)  ? ? ?Assessment: ?71 yo M on warfarin PTA for mechanical MVR (INR goal 2.5-3.5). Warfarin has been held  in preparation for RHC/LHC for TAVR work-up. Pharmacy has been consulted to start heparin when INR < 2.5. INR 2.4 on 3/17 and heparin was resumed. INR has decreased to 2.0 and Heparin level is therapeutic at 0.41 1550 units/hr. CBC stable. ? ?No s/x of bleeding noted. CrCl has decreased, will continue to monitor. ? ?PTA warfarin dose: 5 mg/day for 5 days then 2.5 x2 days then repeat ? ?Goal of Therapy:  ?Heparin level 0.3-0.7 units/ml ?Monitor platelets by anticoagulation protocol: Yes ?  ?Plan:  ?Continue heparin gtt at 1550 units/hr ?Heparin level daily and CBC daily while on heparin ?Follow-up transition back to warfarin post-cath ? ?Thank you for allowing pharmacy to participate in this patient's  care. ? ?Reatha Harps, PharmD ?PGY1 Pharmacy Resident ?11/26/2021 7:10 AM ?Check [email or web address] for unit specific pharmacy number ? ? ? ?

## 2021-11-27 ENCOUNTER — Inpatient Hospital Stay (HOSPITAL_COMMUNITY)
Admission: AD | Disposition: A | Discharge status: Home / Self Care | Payer: Self-pay | Source: Ambulatory Visit | Attending: Cardiology

## 2021-11-27 DIAGNOSIS — I350 Nonrheumatic aortic (valve) stenosis: Secondary | ICD-10-CM

## 2021-11-27 DIAGNOSIS — I5023 Acute on chronic systolic (congestive) heart failure: Secondary | ICD-10-CM | POA: Diagnosis not present

## 2021-11-27 DIAGNOSIS — I509 Heart failure, unspecified: Secondary | ICD-10-CM

## 2021-11-27 HISTORY — PX: RIGHT AND LEFT HEART CATH: CATH118262

## 2021-11-27 LAB — POCT I-STAT EG7
Acid-Base Excess: 3 mmol/L — ABNORMAL HIGH (ref 0.0–2.0)
Acid-Base Excess: 4 mmol/L — ABNORMAL HIGH (ref 0.0–2.0)
Bicarbonate: 28.1 mmol/L — ABNORMAL HIGH (ref 20.0–28.0)
Bicarbonate: 28.7 mmol/L — ABNORMAL HIGH (ref 20.0–28.0)
Calcium, Ion: 1.12 mmol/L — ABNORMAL LOW (ref 1.15–1.40)
Calcium, Ion: 1.16 mmol/L (ref 1.15–1.40)
HCT: 34 % — ABNORMAL LOW (ref 39.0–52.0)
HCT: 34 % — ABNORMAL LOW (ref 39.0–52.0)
Hemoglobin: 11.6 g/dL — ABNORMAL LOW (ref 13.0–17.0)
Hemoglobin: 11.6 g/dL — ABNORMAL LOW (ref 13.0–17.0)
O2 Saturation: 58 %
O2 Saturation: 59 %
Potassium: 3.3 mmol/L — ABNORMAL LOW (ref 3.5–5.1)
Potassium: 3.4 mmol/L — ABNORMAL LOW (ref 3.5–5.1)
Sodium: 139 mmol/L (ref 135–145)
Sodium: 140 mmol/L (ref 135–145)
TCO2: 29 mmol/L (ref 22–32)
TCO2: 30 mmol/L (ref 22–32)
pCO2, Ven: 42.3 mmHg — ABNORMAL LOW (ref 44–60)
pCO2, Ven: 42.3 mmHg — ABNORMAL LOW (ref 44–60)
pH, Ven: 7.431 — ABNORMAL HIGH (ref 7.25–7.43)
pH, Ven: 7.44 — ABNORMAL HIGH (ref 7.25–7.43)
pO2, Ven: 30 mmHg — CL (ref 32–45)
pO2, Ven: 30 mmHg — CL (ref 32–45)

## 2021-11-27 LAB — GLUCOSE, CAPILLARY
Glucose-Capillary: 80 mg/dL (ref 70–99)
Glucose-Capillary: 59 mg/dL — ABNORMAL LOW (ref 70–99)
Glucose-Capillary: 104 mg/dL — ABNORMAL HIGH (ref 70–99)
Glucose-Capillary: 68 mg/dL — ABNORMAL LOW (ref 70–99)
Glucose-Capillary: 306 mg/dL — ABNORMAL HIGH (ref 70–99)

## 2021-11-27 LAB — CBC
HCT: 35.8 % — ABNORMAL LOW (ref 39.0–52.0)
Hemoglobin: 11.2 g/dL — ABNORMAL LOW (ref 13.0–17.0)
MCH: 25.7 pg — ABNORMAL LOW (ref 26.0–34.0)
MCHC: 31.3 g/dL (ref 30.0–36.0)
MCV: 82.1 fL (ref 80.0–100.0)
Platelets: 221 10*3/uL (ref 150–400)
RBC: 4.36 MIL/uL (ref 4.22–5.81)
RDW: 21.3 % — ABNORMAL HIGH (ref 11.5–15.5)
WBC: 4.9 10*3/uL (ref 4.0–10.5)
nRBC: 0 % (ref 0.0–0.2)

## 2021-11-27 LAB — COOXEMETRY PANEL
Carboxyhemoglobin: 1.2 % (ref 0.5–1.5)
Methemoglobin: <0.7 % (ref 0.0–1.5)
O2 Saturation: 50.7 %
Total hemoglobin: 10.9 g/dL — ABNORMAL LOW (ref 12.0–16.0)

## 2021-11-27 LAB — BASIC METABOLIC PANEL
Anion gap: 12 (ref 5–15)
BUN: 105 mg/dL — ABNORMAL HIGH (ref 8–23)
CO2: 25 mmol/L (ref 22–32)
Calcium: 8.7 mg/dL — ABNORMAL LOW (ref 8.9–10.3)
Chloride: 98 mmol/L (ref 98–111)
Creatinine, Ser: 3.06 mg/dL — ABNORMAL HIGH (ref 0.61–1.24)
GFR, Estimated: 21 mL/min — ABNORMAL LOW (ref >60)
Glucose, Bld: 87 mg/dL (ref 70–99)
Potassium: 3.9 mmol/L (ref 3.5–5.1)
Sodium: 135 mmol/L (ref 135–145)

## 2021-11-27 LAB — URIC ACID: Uric Acid, Serum: 9.9 mg/dL — ABNORMAL HIGH (ref 3.7–8.6)

## 2021-11-27 LAB — MAGNESIUM: Magnesium: 2.3 mg/dL (ref 1.7–2.4)

## 2021-11-27 LAB — PROTIME-INR
INR: 1.9 — ABNORMAL HIGH (ref 0.8–1.2)
Prothrombin Time: 21.7 seconds — ABNORMAL HIGH (ref 11.4–15.2)

## 2021-11-27 LAB — HEPARIN LEVEL (UNFRACTIONATED): Heparin Unfractionated: 0.38 IU/mL (ref 0.30–0.70)

## 2021-11-27 SURGERY — RIGHT AND LEFT HEART CATH

## 2021-11-27 MED ORDER — LIDOCAINE HCL (PF) 1 % IJ SOLN
INTRAMUSCULAR | Status: AC
  Filled 2021-11-27: qty 30

## 2021-11-27 MED ORDER — ONDANSETRON HCL 4 MG/2ML IJ SOLN: 4.0000 mg | Freq: Four times a day (QID) | INTRAMUSCULAR | Status: DC | PRN

## 2021-11-27 MED ORDER — DEXTROSE 50 % IV SOLN
INTRAVENOUS | Status: AC
  Filled 2021-11-27: qty 50

## 2021-11-27 MED ORDER — HYDRALAZINE HCL 20 MG/ML IJ SOLN: 10.0000 mg | INTRAMUSCULAR | Status: AC | PRN

## 2021-11-27 MED ORDER — COLCHICINE 0.3 MG HALF TABLET
0.3000 mg | ORAL_TABLET | Freq: Once | ORAL | Status: AC
  Administered 2021-11-27: 0.3 mg via ORAL
  Filled 2021-11-27: qty 1

## 2021-11-27 MED ORDER — FENTANYL CITRATE (PF) 100 MCG/2ML IJ SOLN
INTRAMUSCULAR | Status: DC | PRN
Start: 2021-11-27 — End: 2021-11-27
  Administered 2021-11-27 (×3): 25 ug via INTRAVENOUS

## 2021-11-27 MED ORDER — HEPARIN SODIUM (PORCINE) 1000 UNIT/ML IJ SOLN
INTRAMUSCULAR | Status: AC
  Filled 2021-11-27: qty 10

## 2021-11-27 MED ORDER — HEPARIN (PORCINE) 25000 UT/250ML-% IV SOLN
1850.0000 [IU]/h | INTRAVENOUS | Status: DC
  Administered 2021-11-27: 1550 [IU]/h via INTRAVENOUS
  Administered 2021-11-28: 1700 [IU]/h via INTRAVENOUS
  Administered 2021-11-29 (×2): 1850 [IU]/h via INTRAVENOUS
  Filled 2021-11-27 (×4): qty 250

## 2021-11-27 MED ORDER — MIDAZOLAM HCL 2 MG/2ML IJ SOLN
INTRAMUSCULAR | Status: AC
  Filled 2021-11-27: qty 2

## 2021-11-27 MED ORDER — IOHEXOL 350 MG/ML SOLN
INTRAVENOUS | Status: DC | PRN
  Administered 2021-11-27: 3 mL via INTRA_ARTERIAL

## 2021-11-27 MED ORDER — PREDNISONE 20 MG PO TABS
40.0000 mg | ORAL_TABLET | Freq: Every day | ORAL | Status: DC
Start: 2021-11-27 — End: 2021-11-28
  Administered 2021-11-28: 40 mg via ORAL
  Filled 2021-11-27 (×2): qty 2

## 2021-11-27 MED ORDER — WARFARIN SODIUM 5 MG PO TABS
5.0000 mg | ORAL_TABLET | Freq: Once | ORAL | Status: AC
  Administered 2021-11-27: 5 mg via ORAL
  Filled 2021-11-27: qty 1

## 2021-11-27 MED ORDER — HEPARIN (PORCINE) IN NACL 1000-0.9 UT/500ML-% IV SOLN
INTRAVENOUS | Status: DC | PRN
  Administered 2021-11-27: 500 mL

## 2021-11-27 MED ORDER — LIDOCAINE HCL (PF) 1 % IJ SOLN
INTRAMUSCULAR | Status: DC | PRN
  Administered 2021-11-27: 3 mL
  Administered 2021-11-27: 15 mL

## 2021-11-27 MED ORDER — ACETAMINOPHEN 325 MG PO TABS
650.0000 mg | ORAL_TABLET | ORAL | Status: DC | PRN
  Administered 2021-11-27 – 2021-11-30 (×2): 650 mg via ORAL
  Filled 2021-11-27 (×2): qty 2

## 2021-11-27 MED ORDER — GLUCOSE 40 % PO GEL
1.0000 | ORAL | Status: DC
  Filled 2021-11-27: qty 1

## 2021-11-27 MED ORDER — SODIUM CHLORIDE 0.9% FLUSH
3.0000 mL | INTRAVENOUS | Status: DC | PRN
  Administered 2021-11-30: 3 mL via INTRAVENOUS

## 2021-11-27 MED ORDER — SODIUM CHLORIDE 0.9 % IV SOLN: 250.0000 mL | INTRAVENOUS | Status: DC | PRN

## 2021-11-27 MED ORDER — FENTANYL CITRATE (PF) 100 MCG/2ML IJ SOLN
INTRAMUSCULAR | Status: AC
  Filled 2021-11-27: qty 2

## 2021-11-27 MED ORDER — MIDAZOLAM HCL 2 MG/2ML IJ SOLN
INTRAMUSCULAR | Status: DC | PRN
  Administered 2021-11-27 (×2): 1 mg via INTRAVENOUS

## 2021-11-27 MED ORDER — MILRINONE LACTATE IN DEXTROSE 20-5 MG/100ML-% IV SOLN
0.1250 ug/kg/min | INTRAVENOUS | Status: DC
  Administered 2021-11-27 – 2021-11-29 (×3): 0.125 ug/kg/min via INTRAVENOUS
  Filled 2021-11-27 (×6): qty 100

## 2021-11-27 MED ORDER — DEXTROSE 50 % IV SOLN
12.5000 g | INTRAVENOUS | Status: AC
  Administered 2021-11-27: 12.5 g via INTRAVENOUS

## 2021-11-27 MED ORDER — VERAPAMIL HCL 2.5 MG/ML IV SOLN
INTRAVENOUS | Status: DC | PRN
  Administered 2021-11-27: 10 mL via INTRA_ARTERIAL

## 2021-11-27 MED ORDER — LABETALOL HCL 5 MG/ML IV SOLN: 10.0000 mg | INTRAVENOUS | Status: AC | PRN

## 2021-11-27 MED ORDER — SODIUM CHLORIDE 0.9% FLUSH
3.0000 mL | Freq: Two times a day (BID) | INTRAVENOUS | Status: DC
  Administered 2021-11-28 – 2021-12-02 (×5): 3 mL via INTRAVENOUS

## 2021-11-27 MED ORDER — WARFARIN - PHARMACIST DOSING INPATIENT: Freq: Every day | Status: DC

## 2021-11-27 MED ORDER — HEPARIN SODIUM (PORCINE) 1000 UNIT/ML IJ SOLN: INTRAMUSCULAR | Status: DC | PRN

## 2021-11-27 MED ORDER — HEPARIN (PORCINE) IN NACL 1000-0.9 UT/500ML-% IV SOLN
INTRAVENOUS | Status: AC
  Filled 2021-11-27: qty 1000

## 2021-11-27 MED ORDER — VERAPAMIL HCL 2.5 MG/ML IV SOLN
INTRAVENOUS | Status: AC
  Filled 2021-11-27: qty 2

## 2021-11-27 SURGICAL SUPPLY — 20 items
CATH BALLN WEDGE 5F 110CM (CATHETERS) ×1 IMPLANT
CATH INFINITI 5FR ANG PIGTAIL (CATHETERS) ×1 IMPLANT
CATH INFINITI 5FR JR4 125CM (CATHETERS) ×1 IMPLANT
CATH INFINITI JR4 5F (CATHETERS) ×1 IMPLANT
CLOSURE MYNX CONTROL 6F/7F (Vascular Products) ×1 IMPLANT
DEVICE RAD COMP TR BAND LRG (VASCULAR PRODUCTS) ×1 IMPLANT
GLIDESHEATH SLEND A-KIT 6F 22G (SHEATH) IMPLANT
GLIDESHEATH SLEND SS 6F .021 (SHEATH) ×1 IMPLANT
GUIDEWIRE INQWIRE 1.5J.035X260 (WIRE) IMPLANT
INQWIRE 1.5J .035X260CM (WIRE) ×2
KIT HEART LEFT (KITS) ×3 IMPLANT
PACK CARDIAC CATHETERIZATION (CUSTOM PROCEDURE TRAY) ×3 IMPLANT
SHEATH 6FR 85 DEST SLENDER (SHEATH) ×1 IMPLANT
SHEATH DESTINATION MP 6FR 90CM (SHEATH) ×1 IMPLANT
SHEATH GLIDE SLENDER 4/5FR (SHEATH) ×1 IMPLANT
SHEATH PINNACLE 5F 10CM (SHEATH) ×1 IMPLANT
SHEATH PINNACLE 6F 10CM (SHEATH) ×1 IMPLANT
SHEATH PROBE COVER 6X72 (BAG) ×1 IMPLANT
TRANSDUCER W/STOPCOCK (MISCELLANEOUS) ×3 IMPLANT
WIRE EMERALD ST .035X260CM (WIRE) ×1 IMPLANT

## 2021-11-27 NOTE — H&P (View-Only) (Signed)
? ? ?Advanced Heart Failure Rounding Note ? ? ?Subjective:   ? ?Milrinone discontinued 3/16.  ? ?Co-ox 51% but pattern has been low in early am, good on repeat.  CVP 12 today, creatinine 3.04 => 3.06.  IV Lasix was restarted yesterday.  Weight down 2 lbs.  ? ?INR 1.9 on heparin gtt.  ? ?Denies current dyspnea. No complaints currently.  ? ?Objective:   ?Weight Range: ? ?Vital Signs:   ?Temp:  [97.3 ?F (36.3 ?C)-98.4 ?F (36.9 ?C)] 97.4 ?F (36.3 ?C) (03/20 7741) ?Pulse Rate:  [84-104] 84 (03/20 0708) ?Resp:  [18-19] 18 (03/20 0708) ?BP: (97-115)/(52-65) 115/65 (03/20 0708) ?SpO2:  [98 %-100 %] 99 % (03/20 0708) ?Weight:  [91.9 kg] 91.9 kg (03/20 0400) ?Last BM Date : 11/25/21 ? ?Weight change: ?Filed Weights  ? 11/25/21 0415 11/26/21 0352 11/27/21 0400  ?Weight: 92.8 kg 92.6 kg 91.9 kg  ? ? ?Intake/Output:  ? ?Intake/Output Summary (Last 24 hours) at 11/27/2021 0829 ?Last data filed at 11/27/2021 0708 ?Gross per 24 hour  ?Intake 969.82 ml  ?Output 2100 ml  ?Net -1130.18 ml  ?  ?PHYSICAL EXAM: ?CVP 12 ?General: NAD ?Neck: JVP 12 cm, no thyromegaly or thyroid nodule.  ?Lungs: Clear to auscultation bilaterally with normal respiratory effort. ?CV: Nondisplaced PMI.  Heart irregular S1/S2 with mechanical S1, no S3/S4, 2/6 SEM RUSB with 2/6 diastolic murmur.  1+ ankle edema.   ?Abdomen: Soft, nontender, no hepatosplenomegaly, no distention.  ?Skin: Intact without lesions or rashes.  ?Neurologic: Alert and oriented x 3.  ?Psych: Normal affect. ?Extremities: No clubbing or cyanosis.  ?HEENT: Normal.  ? ?Telemetry:  Chronic Afib, 80s (personally reviewed) ? ?Labs: ?Basic Metabolic Panel: ?Recent Labs  ?Lab 11/23/21 ?0510 11/24/21 ?0440 11/25/21 ?0425 11/26/21 ?0410 11/27/21 ?0300  ?NA 136 136 136 137 135  ?K 3.7 4.1 4.0 3.9 3.9  ?CL 98 96* 97* 98 98  ?CO2 '29 28 27 27 25  '$ ?GLUCOSE 67* 71 64* 68* 87  ?BUN 70* 79* 88* 98* 105*  ?CREATININE 2.79* 2.66* 2.84* 3.04* 3.06*  ?CALCIUM 8.6* 8.5* 8.6* 8.3* 8.7*  ?MG  --  1.9 2.2 2.4 2.3   ? ? ?Liver Function Tests: ?No results for input(s): AST, ALT, ALKPHOS, BILITOT, PROT, ALBUMIN in the last 168 hours. ? ?No results for input(s): LIPASE, AMYLASE in the last 168 hours. ?No results for input(s): AMMONIA in the last 168 hours. ? ?CBC: ?Recent Labs  ?Lab 11/23/21 ?0510 11/24/21 ?0440 11/25/21 ?0425 11/26/21 ?0410 11/27/21 ?0300  ?WBC 5.8 5.9 4.9 4.8 4.9  ?HGB 10.2* 10.7* 11.0* 10.8* 11.2*  ?HCT 32.4* 34.5* 34.3* 34.4* 35.8*  ?MCV 80.2 81.0 80.1 81.3 82.1  ?PLT 162 180 201 213 221  ? ? ?Cardiac Enzymes: ?No results for input(s): CKTOTAL, CKMB, CKMBINDEX, TROPONINI in the last 168 hours. ? ?BNP: ?BNP (last 3 results) ?Recent Labs  ?  11/17/21 ?1155  ?BNP >4,500.0*  ? ? ?ProBNP (last 3 results) ?No results for input(s): PROBNP in the last 8760 hours. ? ? ? ?Other results: ? ?Imaging: ?No results found. ? ? ?Medications:   ? ? ?Scheduled Medications: ? Chlorhexidine Gluconate Cloth  6 each Topical Daily  ? clopidogrel  75 mg Oral Q breakfast  ? furosemide  80 mg Intravenous BID  ? insulin aspart  0-15 Units Subcutaneous TID WC  ? insulin aspart  0-5 Units Subcutaneous QHS  ? insulin aspart  2 Units Subcutaneous TID WC  ? insulin glargine-yfgn  28 Units Subcutaneous QHS  ? pantoprazole  40 mg Oral Daily  ? rosuvastatin  20 mg Oral Daily  ? sodium chloride flush  10-40 mL Intracatheter Q12H  ? sodium chloride flush  3 mL Intravenous Q12H  ? sodium chloride flush  3 mL Intravenous Q12H  ? ? ?Infusions: ? sodium chloride    ? sodium chloride    ? sodium chloride 10 mL/hr at 11/26/21 2146  ? heparin 1,550 Units/hr (11/26/21 2051)  ? ? ?PRN Medications: ?sodium chloride, sodium chloride, acetaminophen, calcium carbonate, diphenhydrAMINE, ondansetron (ZOFRAN) IV, sodium chloride flush, sodium chloride flush, sodium chloride flush ? ? ?Assessment/Plan:  ? ?1. Acute on chronic systolic CHF: Last echo in 4/21 with EF 25-30%, severe LV dilation, normal RV, mild AS and AI, normal mechanical MV.  St Jude ICD. Suspect  ischemic cardiomyopathy.  He is markedly volume overloaded on exam.  AKI at admission, baseline creatinine around 2.1.  Echo this admission with EF 20-25%, RV severely HK, mechanical MV with mean gradient 6, mod-severe TR, moderate AI, ?low flow/low gradient moderate-severe AS with mean gradient 15/AVA 0.96 cm^2.  TEE 3/14 showed EF around 25% but only mildly decreased RV function and trivial TR, normally functioning mechanical MV and abnormal aortic valve with moderate-severe AS and AI. He has diuresed well on milrinone 0.25 + Lasix/metolazone. Milrinone weaned off 3/16.  Co-ox 50% this morning but has been low in early am and good on recheck later in day.  CVP 12 today, back on IV Lasix.  ?- Continue Lasix 80 mg IV bid today.  ?- Off Toprol XL with low output, HR stable.   ?- Other GDMT limited by AKI on CKD stage 3.  ?- hemodynamic R/LHC today to assess filling pressures, cardiac output, and cross aortic valve for AS assessment. Discussed risks/benefits with patient and he agrees to procedure.  ?2. CAD: s/p CABG with MVR in 2002.  Last cath 10/21 with occluded SVG-RCA, and 80% mRCA, totally occluded LAD, patent LIMA-LAD, 85% ostial LCx treated with DES.  No chest pain, doubt ACS.  ?- Continue Plavix (?transition ASA in future as >1 year post-PCI).  ?- Continue Crestor.  ?3. Atrial fibrillation: Permanent. Rate controlled.  ?- HR ok off Toprol XL.  ?- heparin gtt while INR subtherapeutic.  ?4. 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  INR goal 2.5-3.5.  ?- Heparin gtt while warfarin on hold for cath.  ?5. AKI on CKD stage 3: Creatinine up to 3.46 at admission, baseline around 2.1.  Suspect cardiorenal.  Creatinine now 3.06.  ?6. Aortic valve disorder: Abnormal aortic valve, looks functionally bicuspid with fixed noncoronary and right coronary cusps.  Moderate-severe AS with AVA 1.2 cm^2 but DI 0.23.  There is moderate-severe AI as well.    ?- Reviewed w/ structural heart team. He may be a TAVR candidate but risk for worsening renal function with contrast is considerable. Discussed risk vs benefit w/ pt and he is agreeable to proceed w/ further w/u including R/LHC for better aortic valve assessment.   ?- Structural Team appreciated.   ?7. Transaminitis. Mild, likely due to passive congestion  ?- diurese ? ?Loralie Champagne ?11/27/2021 ?8:29 AM ? ?

## 2021-11-27 NOTE — Progress Notes (Addendum)
? ?  Notified by RN that patient has been complaining of left knee pain and it is warm and red concerning for gout. Patient has had gout in one of his fingers in the last couple of months. Patient states this pain started a couple of days ago but seems to be getting worse. No recent knee injury. He denies any history of arthritis. Went to examine patient and his left knee is swollen and mildly warm to the touch with erythema. I am concerned for gout given his exam, history of this, and the fact he is being diuresed. Will check uric acid. WBC normal. He has an allergy to allopurinol which I would not want to use anyway in an acute gout flare. He has tolerated Colchicine well in the past but I am concerned about his kidney function. Creatinine 3.06 today. Discussed with Pharmacy and we reviewed literature - will start Prednisone '40mg'$  for 3 days and give one dose of Colchicine 0.'3mg'$  and then rounding team can reassess in the morning. ? ?Darreld Mclean, PA-C ?11/27/2021 7:48 PM ? ? ?

## 2021-11-27 NOTE — Progress Notes (Signed)
ANTICOAGULATION CONSULT NOTE - Follow Up Consult ? ?Pharmacy Consult for warfarin >> heparin ?Indication:  mechanical MVR ? ?Allergies  ?Allergen Reactions  ? Allopurinol Shortness Of Breath  ? Doxycycline Hives  ? Wilder Glade [Dapagliflozin]   ?  Can not tolerate - dizzy weak almost passed out  ? ? ?Patient Measurements: ?Height: '5\' 11"'$  (180.3 cm) ?Weight: 91.9 kg (202 lb 11.2 oz) ?IBW/kg (Calculated) : 75.3 ?Heparin Dosing Weight: 92.9 kg ? ?Vital Signs: ?Temp: 97.7 ?F (36.5 ?C) (03/20 1122) ?Temp Source: Oral (03/20 1122) ?BP: 113/64 (03/20 1122) ?Pulse Rate: 87 (03/20 1122) ? ?Labs: ?Recent Labs  ?  11/25/21 ?0425 11/25/21 ?0430 11/26/21 ?0355 11/26/21 ?0410 11/27/21 ?0300  ?HGB 11.0*  --   --  10.8* 11.2*  ?HCT 34.3*  --   --  34.4* 35.8*  ?PLT 201  --   --  213 221  ?LABPROT  --  23.1*  --  22.4* 21.7*  ?INR  --  2.1*  --  2.0* 1.9*  ?HEPARINUNFRC  --  0.42 0.41  --  0.38  ?CREATININE 2.84*  --   --  3.04* 3.06*  ? ? ? ?Estimated Creatinine Clearance: 26.8 mL/min (A) (by C-G formula based on SCr of 3.06 mg/dL (H)). ? ? ?Medical History: ?Past Medical History:  ?Diagnosis Date  ? Abnormal nuclear stress test 05/10/2017  ? Allergy   ? Anemia   ? "lost blood w/the cancer"  ? Arthritis   ? back   ? Atrial fibrillation ([place])   ? takes Coumadin daily  ? Bruises easily   ? takes Coumadin daily  ? CAD (coronary artery disease)   ? a. s/p CABG 11/2000 (L-LAD, S-RCA);  b. Lexiscan Myoview 11/12: EF 45%, ischemia and possibly some scar at the base of the inferolateral wall;   c. Lex MV 11/13:  EF 44%, Inf and IL scar/soft tissue atten, small mild amt of inf ischemia,  ? CHF (congestive heart failure) ([place])   ? Constipation   ? takes Colace daily  ? 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 Enlarged prostate   ? Flu 09/2013  ? GERD (gastroesophageal reflux disease)   ? takes Nexium daily  ? Glaucoma   ? History of blood transfusion   ? "related to cancer"  ? History of colon polyps   ? HLD (hyperlipidemia)   ? takes Vytorin  daily  ? HTN (hypertension)   ? takes Amlodipine,Metoprolol, and Ramipril daily  ? Insomnia   ? states he has always been like this.But doesn't take any meds  ? Kidney stones   ? "I passed them all"  ? Nocturia   ? Peripheral edema   ? takes Furosemide daily  ? Peripheral neuropathy   ? Rheumatic fever ~ 1965  ? Rheumatic heart disease   ? a. s/p mechanical ([employer]. Jude) MVR 2002;  b. Echo 5/11: Mild LVH, EF 45-50%, mild AS, mild AI, mean gradient 11 mmHg, MVR with normal gradients, moderate LAE, mild RAE;  c.  Echo 11/13:   mod LVH, EF 55-60%, mild AS (mean 18 mmHg), mild AI, severe LAE, mild RAE, PASP 41  ? 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 SOB (shortness of breath)   ? with exertion  ? Type II diabetes mellitus ([place])   ? takes Januvia,Glipizide,and Metformin daily as well as Lantus  ? Unstable angina ([place]) 06/21/2020  ? ? ?Medications:  ?Infusions:  ? sodium chloride    ?  sodium chloride    ? sodium chloride 10 mL/hr at 11/26/21 2146  ? heparin 1,550 Units/hr (11/26/21 2051)  ? ? ?Assessment: ?71 yo M on warfarin PTA for mechanical MVR (INR goal 2.5-3.5). Warfarin has been held  in preparation for RHC/LHC for TAVR work-up. Pharmacy has been consulted to start heparin when INR < 2.5. INR 2.4 on 3/17 and heparin was resumed.  ? ?INR has decreased to 1.9 and Heparin level is therapeutic at 0.38 on heparin drip  1550 units/hr. CBC stable. ? ?No s/x of bleeding noted. CrCl has decreased, will continue to monitor. ? ?PTA warfarin dose: 5 mg/day for 5 days then 2.5 x2 days then repeat ? ?Goal of Therapy:  ?Heparin level 0.3-0.7 units/ml ?Monitor platelets by anticoagulation protocol: Yes ?  ?Plan:  ?Continue heparin gtt at 1550 units/hr ?Heparin level daily and CBC daily while on heparin ?Follow-up transition back to warfarin post-cath ? ? ?Bonnita Nasuti Pharm.D. CPP, BCPS ?Clinical Pharmacist ?[phone] ?11/27/2021 1:01 PM  ? ?Check [email or web address] for unit specific pharmacy  number ? ? ? ?

## 2021-11-27 NOTE — Progress Notes (Signed)
ANTICOAGULATION CONSULT NOTE - Follow Up Consult ? ?Pharmacy Consult for warfarin >> heparin ?Indication:  mechanical MVR ? ?Allergies  ?Allergen Reactions  ? Allopurinol Shortness Of Breath  ? Doxycycline Hives  ? Wilder Glade [Dapagliflozin]   ?  Can not tolerate - dizzy weak almost passed out  ? ? ?Patient Measurements: ?Height: '5\' 11"'$  (180.3 cm) ?Weight: 91.9 kg (202 lb 11.2 oz) ?IBW/kg (Calculated) : 75.3 ?Heparin Dosing Weight: 92.9 kg ? ?Vital Signs: ?Temp: 97.7 ?F (36.5 ?C) (03/20 1122) ?Temp Source: Oral (03/20 1122) ?BP: 84/73 (03/20 1558) ?Pulse Rate: 80 (03/20 1558) ? ?Labs: ?Recent Labs  ?  11/25/21 ?0425 11/25/21 ?0430 11/26/21 ?0355 11/26/21 ?0410 11/27/21 ?0300 11/27/21 ?1440  ?HGB 11.0*  --   --  10.8* 11.2* 11.6*  11.6*  ?HCT 34.3*  --   --  34.4* 35.8* 34.0*  34.0*  ?PLT 201  --   --  213 221  --   ?LABPROT  --  23.1*  --  22.4* 21.7*  --   ?INR  --  2.1*  --  2.0* 1.9*  --   ?HEPARINUNFRC  --  0.42 0.41  --  0.38  --   ?CREATININE 2.84*  --   --  3.04* 3.06*  --   ? ? ? ?Estimated Creatinine Clearance: 26.8 mL/min (A) (by C-G formula based on SCr of 3.06 mg/dL (H)). ? ? ?Medical History: ?Past Medical History:  ?Diagnosis Date  ? Abnormal nuclear stress test 05/10/2017  ? Allergy   ? Anemia   ? "lost blood w/the cancer"  ? Arthritis   ? back   ? Atrial fibrillation ([place])   ? takes Coumadin daily  ? Bruises easily   ? takes Coumadin daily  ? CAD (coronary artery disease)   ? a. s/p CABG 11/2000 (L-LAD, S-RCA);  b. Lexiscan Myoview 11/12: EF 45%, ischemia and possibly some scar at the base of the inferolateral wall;   c. Lex MV 11/13:  EF 44%, Inf and IL scar/soft tissue atten, small mild amt of inf ischemia,  ? CHF (congestive heart failure) ([place])   ? Constipation   ? takes Colace daily  ? 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 Enlarged prostate   ? Flu 09/2013  ? GERD (gastroesophageal reflux disease)   ? takes Nexium daily  ? Glaucoma   ? History of blood transfusion   ? "related to cancer"  ?  History of colon polyps   ? HLD (hyperlipidemia)   ? takes Vytorin daily  ? HTN (hypertension)   ? takes Amlodipine,Metoprolol, and Ramipril daily  ? Insomnia   ? states he has always been like this.But doesn't take any meds  ? Kidney stones   ? "I passed them all"  ? Nocturia   ? Peripheral edema   ? takes Furosemide daily  ? Peripheral neuropathy   ? Rheumatic fever ~ 1965  ? Rheumatic heart disease   ? a. s/p mechanical (St. Jude) MVR 2002;  b. Echo 5/11: Mild LVH, EF 45-50%, mild AS, mild AI, mean gradient 11 mmHg, MVR with normal gradients, moderate LAE, mild RAE;  c.  Echo 11/13:   mod LVH, EF 55-60%, mild AS (mean 18 mmHg), mild AI, severe LAE, mild RAE, PASP 41  ? 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 SOB (shortness of breath)   ? with exertion  ? Type II diabetes mellitus ([place])   ? takes Januvia,Glipizide,and Metformin daily as  well as Lantus  ? Unstable angina ([place]) 06/21/2020  ? ? ?Medications:  ?Infusions:  ? [MAR Hold] sodium chloride    ? sodium chloride    ? sodium chloride 10 mL/hr at 11/26/21 2146  ? heparin 1,550 Units/hr (11/26/21 2051)  ? milrinone    ? ? ?Assessment: ?71 yo M on warfarin PTA for mechanical MVR (INR goal 2.5-3.5). Warfarin has been held  in preparation for RHC/LHC for TAVR work-up. Pharmacy has been consulted to start heparin when INR < 2.5. INR 2.4 on 3/17 and heparin was resumed.  ? ?INR has decreased to 1.9 today - last dose warfarin on 3/12. Underwent cardiac cath finding elevated R/L filling pressures and preserved cardiac output - not ideal TAVR candidate per MD. Plan to restart warfarin and heparin - asked to time heparin 8 hr after sheath removal (documented on 3/20 at 1512). Of note, heparin had been therapeutic this morning on heparin drip at 1550 units/hr. CBC stable. No s/x of bleeding noted.  ? ?PTA warfarin dose: 5 mg/day for 5 days then 2.5 x2 days then repeat ? ?Goal of Therapy:  ?Heparin level 0.3-0.7 units/ml ?Monitor  platelets by anticoagulation protocol: Yes ?  ?Plan:  ?Restart heparin gtt at 1550 units/hr on 3/20'@2330'$  - order level 8 hr after start ?Order warfarin 5 mg tonight  ?Heparin level and INR daily and CBC daily while on heparin/warfarin ? ?Antonietta Jewel, PharmD, BCCCP ?Clinical Pharmacist  ?Phone: [phone] ?11/27/2021 4:05 PM ? ?Please check AMION for all [place] phone numbers ?After 10:00 PM, call [place] [phone] ? ? ? ? ?

## 2021-11-27 NOTE — Progress Notes (Signed)
Mobility Specialist Progress Note  ? ? 11/27/21 1159  ?Mobility  ?Activity Contraindicated/medical hold  ? ?Pt going for cath soon. Will f/u as schedule permits.  ? ?John [name] ?Mobility Specialist  ?  ?

## 2021-11-27 NOTE — Plan of Care (Signed)

## 2021-11-27 NOTE — Progress Notes (Signed)
? ? ?Advanced Heart Failure Rounding Note ? ? ?Subjective:   ? ?Milrinone discontinued 3/16.  ? ?Co-ox 51% but pattern has been low in early am, good on repeat.  CVP 12 today, creatinine 3.04 => 3.06.  IV Lasix was restarted yesterday.  Weight down 2 lbs.  ? ?INR 1.9 on heparin gtt.  ? ?Denies current dyspnea. No complaints currently.  ? ?Objective:   ?Weight Range: ? ?Vital Signs:   ?Temp:  [97.3 ?F (36.3 ?C)-98.4 ?F (36.9 ?C)] 97.4 ?F (36.3 ?C) (03/20 0165) ?Pulse Rate:  [84-104] 84 (03/20 0708) ?Resp:  [18-19] 18 (03/20 0708) ?BP: (97-115)/(52-65) 115/65 (03/20 0708) ?SpO2:  [98 %-100 %] 99 % (03/20 0708) ?Weight:  [91.9 kg] 91.9 kg (03/20 0400) ?Last BM Date : 11/25/21 ? ?Weight change: ?Filed Weights  ? 11/25/21 0415 11/26/21 0352 11/27/21 0400  ?Weight: 92.8 kg 92.6 kg 91.9 kg  ? ? ?Intake/Output:  ? ?Intake/Output Summary (Last 24 hours) at 11/27/2021 0829 ?Last data filed at 11/27/2021 0708 ?Gross per 24 hour  ?Intake 969.82 ml  ?Output 2100 ml  ?Net -1130.18 ml  ?  ?PHYSICAL EXAM: ?CVP 12 ?General: NAD ?Neck: JVP 12 cm, no thyromegaly or thyroid nodule.  ?Lungs: Clear to auscultation bilaterally with normal respiratory effort. ?CV: Nondisplaced PMI.  Heart irregular S1/S2 with mechanical S1, no S3/S4, 2/6 SEM RUSB with 2/6 diastolic murmur.  1+ ankle edema.   ?Abdomen: Soft, nontender, no hepatosplenomegaly, no distention.  ?Skin: Intact without lesions or rashes.  ?Neurologic: Alert and oriented x 3.  ?Psych: Normal affect. ?Extremities: No clubbing or cyanosis.  ?HEENT: Normal.  ? ?Telemetry:  Chronic Afib, 80s (personally reviewed) ? ?Labs: ?Basic Metabolic Panel: ?Recent Labs  ?Lab 11/23/21 ?0510 11/24/21 ?0440 11/25/21 ?0425 11/26/21 ?0410 11/27/21 ?0300  ?NA 136 136 136 137 135  ?K 3.7 4.1 4.0 3.9 3.9  ?CL 98 96* 97* 98 98  ?CO2 '29 28 27 27 25  '$ ?GLUCOSE 67* 71 64* 68* 87  ?BUN 70* 79* 88* 98* 105*  ?CREATININE 2.79* 2.66* 2.84* 3.04* 3.06*  ?CALCIUM 8.6* 8.5* 8.6* 8.3* 8.7*  ?MG  --  1.9 2.2 2.4 2.3   ? ? ?Liver Function Tests: ?No results for input(s): AST, ALT, ALKPHOS, BILITOT, PROT, ALBUMIN in the last 168 hours. ? ?No results for input(s): LIPASE, AMYLASE in the last 168 hours. ?No results for input(s): AMMONIA in the last 168 hours. ? ?CBC: ?Recent Labs  ?Lab 11/23/21 ?0510 11/24/21 ?0440 11/25/21 ?0425 11/26/21 ?0410 11/27/21 ?0300  ?WBC 5.8 5.9 4.9 4.8 4.9  ?HGB 10.2* 10.7* 11.0* 10.8* 11.2*  ?HCT 32.4* 34.5* 34.3* 34.4* 35.8*  ?MCV 80.2 81.0 80.1 81.3 82.1  ?PLT 162 180 201 213 221  ? ? ?Cardiac Enzymes: ?No results for input(s): CKTOTAL, CKMB, CKMBINDEX, TROPONINI in the last 168 hours. ? ?BNP: ?BNP (last 3 results) ?Recent Labs  ?  11/17/21 ?1155  ?BNP >4,500.0*  ? ? ?ProBNP (last 3 results) ?No results for input(s): PROBNP in the last 8760 hours. ? ? ? ?Other results: ? ?Imaging: ?No results found. ? ? ?Medications:   ? ? ?Scheduled Medications: ? Chlorhexidine Gluconate Cloth  6 each Topical Daily  ? clopidogrel  75 mg Oral Q breakfast  ? furosemide  80 mg Intravenous BID  ? insulin aspart  0-15 Units Subcutaneous TID WC  ? insulin aspart  0-5 Units Subcutaneous QHS  ? insulin aspart  2 Units Subcutaneous TID WC  ? insulin glargine-yfgn  28 Units Subcutaneous QHS  ? pantoprazole  40 mg Oral Daily  ? rosuvastatin  20 mg Oral Daily  ? sodium chloride flush  10-40 mL Intracatheter Q12H  ? sodium chloride flush  3 mL Intravenous Q12H  ? sodium chloride flush  3 mL Intravenous Q12H  ? ? ?Infusions: ? sodium chloride    ? sodium chloride    ? sodium chloride 10 mL/hr at 11/26/21 2146  ? heparin 1,550 Units/hr (11/26/21 2051)  ? ? ?PRN Medications: ?sodium chloride, sodium chloride, acetaminophen, calcium carbonate, diphenhydrAMINE, ondansetron (ZOFRAN) IV, sodium chloride flush, sodium chloride flush, sodium chloride flush ? ? ?Assessment/Plan:  ? ?1. Acute on chronic systolic CHF: Last echo in 4/21 with EF 25-30%, severe LV dilation, normal RV, mild AS and AI, normal mechanical MV.  St Jude ICD. Suspect  ischemic cardiomyopathy.  He is markedly volume overloaded on exam.  AKI at admission, baseline creatinine around 2.1.  Echo this admission with EF 20-25%, RV severely HK, mechanical MV with mean gradient 6, mod-severe TR, moderate AI, ?low flow/low gradient moderate-severe AS with mean gradient 15/AVA 0.96 cm^2.  TEE 3/14 showed EF around 25% but only mildly decreased RV function and trivial TR, normally functioning mechanical MV and abnormal aortic valve with moderate-severe AS and AI. He has diuresed well on milrinone 0.25 + Lasix/metolazone. Milrinone weaned off 3/16.  Co-ox 50% this morning but has been low in early am and good on recheck later in day.  CVP 12 today, back on IV Lasix.  ?- Continue Lasix 80 mg IV bid today.  ?- Off Toprol XL with low output, HR stable.   ?- Other GDMT limited by AKI on CKD stage 3.  ?- hemodynamic R/LHC today to assess filling pressures, cardiac output, and cross aortic valve for AS assessment. Discussed risks/benefits with patient and he agrees to procedure.  ?2. CAD: s/p CABG with MVR in 2002.  Last cath 10/21 with occluded SVG-RCA, and 80% mRCA, totally occluded LAD, patent LIMA-LAD, 85% ostial LCx treated with DES.  No chest pain, doubt ACS.  ?- Continue Plavix (?transition ASA in future as >1 year post-PCI).  ?- Continue Crestor.  ?3. Atrial fibrillation: Permanent. Rate controlled.  ?- HR ok off Toprol XL.  ?- heparin gtt while INR subtherapeutic.  ?4. 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  INR goal 2.5-3.5.  ?- Heparin gtt while warfarin on hold for cath.  ?5. AKI on CKD stage 3: Creatinine up to 3.46 at admission, baseline around 2.1.  Suspect cardiorenal.  Creatinine now 3.06.  ?6. Aortic valve disorder: Abnormal aortic valve, looks functionally bicuspid with fixed noncoronary and right coronary cusps.  Moderate-severe AS with AVA 1.2 cm^2 but DI 0.23.  There is moderate-severe AI as well.    ?- Reviewed w/ structural heart team. He may be a TAVR candidate but risk for worsening renal function with contrast is considerable. Discussed risk vs benefit w/ pt and he is agreeable to proceed w/ further w/u including R/LHC for better aortic valve assessment.   ?- Structural Team appreciated.   ?7. Transaminitis. Mild, likely due to passive congestion  ?- diurese ? ?Loralie Champagne ?11/27/2021 ?8:29 AM ? ?

## 2021-11-27 NOTE — Interval H&P Note (Signed)
History and Physical Interval Note: ? ?11/27/2021 ?2:18 PM ? ?John [name]  has presented today for surgery, with the diagnosis of CHF.  The various methods of treatment have been discussed with the patient and family. After consideration of risks, benefits and other options for treatment, the patient has consented to  Procedure(s): ?RIGHT AND LEFT HEART CATH (N/A) as a surgical intervention.  The patient's history has been reviewed, patient examined, no change in status, stable for surgery.  I have reviewed the patient's chart and labs.  Questions were answered to the patient's satisfaction.   ? ? ?Tempie Gibeault [name] ? ? ?

## 2021-11-28 ENCOUNTER — Encounter (HOSPITAL_COMMUNITY): Payer: Self-pay | Admitting: Cardiology

## 2021-11-28 DIAGNOSIS — I5023 Acute on chronic systolic (congestive) heart failure: Secondary | ICD-10-CM | POA: Diagnosis not present

## 2021-11-28 LAB — CBC
HCT: 32.9 % — ABNORMAL LOW (ref 39.0–52.0)
Hemoglobin: 10.6 g/dL — ABNORMAL LOW (ref 13.0–17.0)
MCH: 25.9 pg — ABNORMAL LOW (ref 26.0–34.0)
MCHC: 32.2 g/dL (ref 30.0–36.0)
MCV: 80.2 fL (ref 80.0–100.0)
Platelets: 238 10*3/uL (ref 150–400)
RBC: 4.1 MIL/uL — ABNORMAL LOW (ref 4.22–5.81)
RDW: 21.2 % — ABNORMAL HIGH (ref 11.5–15.5)
WBC: 5.1 10*3/uL (ref 4.0–10.5)
nRBC: 0 % (ref 0.0–0.2)

## 2021-11-28 LAB — BASIC METABOLIC PANEL
Anion gap: 11 (ref 5–15)
BUN: 102 mg/dL — ABNORMAL HIGH (ref 8–23)
CO2: 27 mmol/L (ref 22–32)
Calcium: 8.7 mg/dL — ABNORMAL LOW (ref 8.9–10.3)
Chloride: 99 mmol/L (ref 98–111)
Creatinine, Ser: 2.68 mg/dL — ABNORMAL HIGH (ref 0.61–1.24)
GFR, Estimated: 25 mL/min — ABNORMAL LOW (ref >60)
Glucose, Bld: 137 mg/dL — ABNORMAL HIGH (ref 70–99)
Potassium: 3.7 mmol/L (ref 3.5–5.1)
Sodium: 137 mmol/L (ref 135–145)

## 2021-11-28 LAB — MAGNESIUM: Magnesium: 2.2 mg/dL (ref 1.7–2.4)

## 2021-11-28 LAB — PROTIME-INR
INR: 1.8 — ABNORMAL HIGH (ref 0.8–1.2)
Prothrombin Time: 20.5 seconds — ABNORMAL HIGH (ref 11.4–15.2)

## 2021-11-28 LAB — GLUCOSE, CAPILLARY
Glucose-Capillary: 128 mg/dL — ABNORMAL HIGH (ref 70–99)
Glucose-Capillary: 288 mg/dL — ABNORMAL HIGH (ref 70–99)
Glucose-Capillary: 271 mg/dL — ABNORMAL HIGH (ref 70–99)
Glucose-Capillary: 323 mg/dL — ABNORMAL HIGH (ref 70–99)
Glucose-Capillary: 278 mg/dL — ABNORMAL HIGH (ref 70–99)

## 2021-11-28 LAB — COOXEMETRY PANEL
Carboxyhemoglobin: 1.1 % (ref 0.5–1.5)
Methemoglobin: <0.7 % (ref 0.0–1.5)
O2 Saturation: 57.5 %
Total hemoglobin: 11 g/dL — ABNORMAL LOW (ref 12.0–16.0)

## 2021-11-28 LAB — HEPARIN LEVEL (UNFRACTIONATED)
Heparin Unfractionated: 0.25 IU/mL — ABNORMAL LOW (ref 0.30–0.70)
Heparin Unfractionated: 0.25 IU/mL — ABNORMAL LOW (ref 0.30–0.70)

## 2021-11-28 MED ORDER — TRAMADOL HCL 50 MG PO TABS
50.0000 mg | ORAL_TABLET | Freq: Two times a day (BID) | ORAL | Status: DC | PRN
  Administered 2021-11-28: 50 mg via ORAL
  Filled 2021-11-28: qty 1

## 2021-11-28 MED ORDER — POTASSIUM CHLORIDE CRYS ER 20 MEQ PO TBCR
30.0000 meq | EXTENDED_RELEASE_TABLET | Freq: Once | ORAL | Status: AC
  Administered 2021-11-28: 30 meq via ORAL
  Filled 2021-11-28: qty 1

## 2021-11-28 MED ORDER — INSULIN GLARGINE-YFGN 100 UNIT/ML ~~LOC~~ SOLN
14.0000 [IU] | Freq: Two times a day (BID) | SUBCUTANEOUS | Status: DC
  Administered 2021-11-28 – 2021-11-30 (×5): 14 [IU] via SUBCUTANEOUS
  Filled 2021-11-28 (×6): qty 0.14

## 2021-11-28 MED ORDER — OXYCODONE HCL 5 MG PO TABS
5.0000 mg | ORAL_TABLET | Freq: Four times a day (QID) | ORAL | Status: DC | PRN
  Administered 2021-11-28 – 2021-12-02 (×7): 5 mg via ORAL
  Filled 2021-11-28 (×7): qty 1

## 2021-11-28 MED ORDER — WARFARIN SODIUM 2.5 MG PO TABS: 2.5000 mg | ORAL_TABLET | Freq: Once | ORAL | Status: DC

## 2021-11-28 MED ORDER — ALUM & MAG HYDROXIDE-SIMETH 200-200-20 MG/5ML PO SUSP
15.0000 mL | ORAL | Status: DC | PRN
  Administered 2021-11-28: 15 mL via ORAL
  Filled 2021-11-28: qty 30

## 2021-11-28 MED ORDER — HEPARIN BOLUS VIA INFUSION
1300.0000 [IU] | Freq: Once | INTRAVENOUS | Status: AC
  Administered 2021-11-28: 1300 [IU] via INTRAVENOUS
  Filled 2021-11-28: qty 1300

## 2021-11-28 MED ORDER — PREDNISONE 20 MG PO TABS
40.0000 mg | ORAL_TABLET | Freq: Every day | ORAL | Status: DC
  Administered 2021-11-29: 40 mg via ORAL
  Filled 2021-11-28: qty 2

## 2021-11-28 MED ORDER — WARFARIN SODIUM 5 MG PO TABS
5.0000 mg | ORAL_TABLET | Freq: Once | ORAL | Status: AC
  Administered 2021-11-28: 5 mg via ORAL
  Filled 2021-11-28: qty 1

## 2021-11-28 MED FILL — Heparin Sodium (Porcine) Inj 1000 Unit/ML: INTRAMUSCULAR | Qty: 10 | Status: AC

## 2021-11-28 NOTE — Progress Notes (Signed)
Patient ID: JERAL ZICK, male   DOB: [birth year], 71 y.o.   MRN: [id number] ? ? ? ?Advanced Heart Failure Rounding Note ? ? ?Subjective:   ? ?Milrinone 0.125 restarted yesterday after cath to allow ongoing diuresis.  This morning, CVP 11 with co-ox 58%.  I/Os net negative 1294 and weight down.  Creatinine 3.06 => 2.68.  ? ?INR 1.8 on heparin gtt + warfarin.   ? ?Denies current dyspnea. However, has developed suspected gout pain in left knee.  ? ?RHC Procedural Findings: ?Hemodynamics (mmHg) ?RA mean 13 ?RV 67/14 ?PA 65/33, mean 46 ?PCWP mean 22 ?LV 117/25 ?AO 105/52 ?Oxygen saturations: ?PA 59% ?AO 97% ?Cardiac Output (Fick) 4.87  ?Cardiac Index (Fick) 2.3 ?PVR 4.9 WU ?Aortic valve mean gradient 13.6 mmHg, AVA 1.61 cm^2 ? ?Objective:   ?Weight Range: ? ?Vital Signs:   ?Temp:  [97.5 ?F (36.4 ?C)-98.4 ?F (36.9 ?C)] 97.7 ?F (36.5 ?C) (03/21 7494) ?Pulse Rate:  [76-99] 99 (03/21 0742) ?Resp:  [12-20] 20 (03/21 0742) ?BP: (84-119)/(45-73) 114/57 (03/21 0742) ?SpO2:  [90 %-100 %] 98 % (03/21 0742) ?Weight:  [89.8 kg] 89.8 kg (03/21 0440) ?Last BM Date : 11/26/21 ? ?Weight change: ?Filed Weights  ? 11/26/21 0352 11/27/21 0400 11/28/21 0440  ?Weight: 92.6 kg 91.9 kg 89.8 kg  ? ? ?Intake/Output:  ? ?Intake/Output Summary (Last 24 hours) at 11/28/2021 0920 ?Last data filed at 11/28/2021 0830 ?Gross per 24 hour  ?Intake 870.73 ml  ?Output 1425 ml  ?Net -554.27 ml  ?  ?PHYSICAL EXAM: ?CVP 11 ?General: NAD ?Neck: JVP 10 cm, no thyromegaly or thyroid nodule.  ?Lungs: Clear to auscultation bilaterally with normal respiratory effort. ?CV: Nondisplaced PMI.  Heart irregular S1/S2, no S3/S4, no murmur.  1+ edema to knees.  ?Abdomen: Soft, nontender, no hepatosplenomegaly, no distention.  ?Skin: Intact without lesions or rashes.  ?Neurologic: Alert and oriented x 3.  ?Psych: Normal affect. ?Extremities: No clubbing or cyanosis. Left knee warm, mildly swollen.  ?HEENT: Normal.  ? ? ?Telemetry:  Chronic Afib, 90s-100s (personally  reviewed) ? ?Labs: ?Basic Metabolic Panel: ?Recent Labs  ?Lab 11/24/21 ?0440 11/25/21 ?0425 11/26/21 ?0410 11/27/21 ?0300 11/27/21 ?1440 11/28/21 ?4967  ?NA 136 136 137 135 139  140 137  ?K 4.1 4.0 3.9 3.9 3.4*  3.3* 3.7  ?CL 96* 97* 98 98  --  99  ?CO2 '28 27 27 25  '$ --  27  ?GLUCOSE 71 64* 68* 87  --  137*  ?BUN 79* 88* 98* 105*  --  102*  ?CREATININE 2.66* 2.84* 3.04* 3.06*  --  2.68*  ?CALCIUM 8.5* 8.6* 8.3* 8.7*  --  8.7*  ?MG 1.9 2.2 2.4 2.3  --  2.2  ? ? ?Liver Function Tests: ?No results for input(s): AST, ALT, ALKPHOS, BILITOT, PROT, ALBUMIN in the last 168 hours. ? ?No results for input(s): LIPASE, AMYLASE in the last 168 hours. ?No results for input(s): AMMONIA in the last 168 hours. ? ?CBC: ?Recent Labs  ?Lab 11/24/21 ?0440 11/25/21 ?0425 11/26/21 ?0410 11/27/21 ?0300 11/27/21 ?1440 11/28/21 ?5916  ?WBC 5.9 4.9 4.8 4.9  --  5.1  ?HGB 10.7* 11.0* 10.8* 11.2* 11.6*  11.6* 10.6*  ?HCT 34.5* 34.3* 34.4* 35.8* 34.0*  34.0* 32.9*  ?MCV 81.0 80.1 81.3 82.1  --  80.2  ?PLT 180 201 213 221  --  238  ? ? ?Cardiac Enzymes: ?No results for input(s): CKTOTAL, CKMB, CKMBINDEX, TROPONINI in the last 168 hours. ? ?BNP: ?BNP (last 3 results) ?Recent Labs  ?  11/17/21 ?1155  ?BNP >4,500.0*  ? ? ?ProBNP (last 3 results) ?No results for input(s): PROBNP in the last 8760 hours. ? ? ? ?Other results: ? ?Imaging: ?CARDIAC CATHETERIZATION ? ?Result Date: 11/27/2021 ?1. Right and left heart filling pressures remain elevated. 2. 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 3. Preserved cardiac output. 4. Mild aortic stenosis by cath. I do not think this patient will be a good TAVR candidate.  With creatinine still elevated and filling pressures elevated, I will start back on low dose milrinone 0.125 and continue diuresis.   ? ? ?Medications:   ? ? ?Scheduled Medications: ? Chlorhexidine Gluconate Cloth  6 each Topical Daily  ? clopidogrel  75 mg Oral Q breakfast  ? furosemide  80 mg Intravenous BID  ? insulin  aspart  0-15 Units Subcutaneous TID WC  ? insulin aspart  0-5 Units Subcutaneous QHS  ? insulin aspart  2 Units Subcutaneous TID WC  ? insulin glargine-yfgn  28 Units Subcutaneous QHS  ? pantoprazole  40 mg Oral Daily  ? predniSONE  40 mg Oral Daily  ? rosuvastatin  20 mg Oral Daily  ? sodium chloride flush  10-40 mL Intracatheter Q12H  ? sodium chloride flush  3 mL Intravenous Q12H  ? sodium chloride flush  3 mL Intravenous Q12H  ? sodium chloride flush  3 mL Intravenous Q12H  ? Warfarin - Pharmacist Dosing Inpatient   Does not apply [id number]  ? ? ?Infusions: ? sodium chloride    ? sodium chloride    ? heparin 1,550 Units/hr (11/27/21 2318)  ? milrinone 0.125 mcg/kg/min (11/27/21 1739)  ? ? ?PRN Medications: ?sodium chloride, sodium chloride, acetaminophen, alum & mag hydroxide-simeth, calcium carbonate, diphenhydrAMINE, ondansetron (ZOFRAN) IV, sodium chloride flush, sodium chloride flush, sodium chloride flush ? ? ?Assessment/Plan:  ? ?1. Acute on chronic systolic CHF: Last echo in 4/21 with EF 25-30%, severe LV dilation, normal RV, mild AS and AI, normal mechanical MV.  St Jude ICD. Suspect ischemic cardiomyopathy.  He is markedly volume overloaded on exam.  AKI at admission, baseline creatinine around 2.1.  Echo this admission with EF 20-25%, RV severely HK, mechanical MV with mean gradient 6, mod-severe TR, moderate AI, ?low flow/low gradient moderate-severe AS with mean gradient 15/AVA 0.96 cm^2.  TEE 3/14 showed EF around 25% but only mildly decreased RV function and trivial TR, normally functioning mechanical MV and abnormal aortic valve with moderate-severe AS and AI. He has diuresed well on milrinone 0.25 + Lasix/metolazone. Milrinone weaned off 3/16.  However, after [place] on 3/20 it was restarted to facilitate further diuresis.  He is currently on milrinone 0.125 with co-ox 58%.  He diuresed well yesterday on IV Lasix with weight down.  CVP 11 today.  ?- Continue Lasix 80 mg IV bid for 1 more day.  ?-  Continue milrinone while on IV Lasix.  ?- Off Toprol XL with low output, HR stable.   ?- Other GDMT limited by AKI on CKD stage 3.  ?- Not candidate at this time for LVAD given renal dysfunction.  ?2. CAD: s/p CABG with MVR in 2002.  Last cath 10/21 with occluded SVG-RCA, and 80% mRCA, totally occluded LAD, patent LIMA-LAD, 85% ostial LCx treated with DES.  No chest pain, doubt ACS.  ?- Continue Plavix (?transition ASA in future as >1 year post-PCI).  ?- Continue Crestor.  ?3. Atrial fibrillation: Permanent. Rate controlled.  ?- HR ok off Toprol XL.  ?- heparin gtt while INR subtherapeutic.  ?4. Mechanical  mitral valve: Stable on TEE this admission, mean gradient 5 and opens well with no perivalvular leakage.  INR goal 2.5-3.5.  ?- Heparin gtt while warfarin is subtherapeutic.  ?- Warfarin has been restarted, INR 1.8.  ?5. AKI on CKD stage 3: Creatinine up to 3.46 at admission, baseline around 2.1.  Suspect cardiorenal.  Creatinine now 3.06 => 2.68.  ?6. Aortic valve disorder: Abnormal aortic valve, looks functionally bicuspid with fixed noncoronary and right coronary cusps.  Echo with ?moderate-severe AS with AVA 1.2 cm^2 but DI 0.23.  There was moderate-severe AI as well.  Hemodynamic right/left heart cath yesterday suggested only mild aortic stenosis. I do not think that he is a TAVR candidate at this time.  ?7. Transaminitis. Mild, likely due to passive congestion  ?- diurese ?8. Gout: Suspect gout flare left knee.  ?- Start prednisone burst.  ?- Had 1 dose colchicine.  ?- Can have tramadol for mild breakthrough pain, oxycodone for severe.  ? ?Diurese for 1 more day then will work on weaning milrinone.  Hopefully home when INR therapeutic.  ? ?Loralie Champagne ?11/28/2021 ?9:20 AM ? ?

## 2021-11-28 NOTE — Progress Notes (Signed)
ANTICOAGULATION CONSULT NOTE - Follow Up Consult ? ?Pharmacy Consult for warfarin >> heparin ?Indication:  mechanical MVR ? ?Allergies  ?Allergen Reactions  ? Allopurinol Shortness Of Breath  ? Doxycycline Hives  ? Wilder Glade [Dapagliflozin]   ?  Can not tolerate - dizzy weak almost passed out  ? ? ?Patient Measurements: ?Height: '5\' 11"'$  (180.3 cm) ?Weight: 89.8 kg (198 lb) ?IBW/kg (Calculated) : 75.3 ?Heparin Dosing Weight: 92.9 kg ? ?Vital Signs: ?Temp: 97.6 ?F (36.4 ?C) (03/21 2005) ?Temp Source: Oral (03/21 2005) ?BP: 123/69 (03/21 2005) ?Pulse Rate: 107 (03/21 2005) ? ?Labs: ?Recent Labs  ?  11/26/21 ?0410 11/27/21 ?0300 11/27/21 ?1440 11/28/21 ?3532 11/28/21 ?9924 11/28/21 ?2124  ?HGB 10.8* 11.2* 11.6*  11.6* 10.6*  --   --   ?HCT 34.4* 35.8* 34.0*  34.0* 32.9*  --   --   ?PLT 213 221  --  238  --   --   ?LABPROT 22.4* 21.7*  --   --  20.5*  --   ?INR 2.0* 1.9*  --   --  1.8*  --   ?HEPARINUNFRC  --  0.38  --   --  0.25* 0.25*  ?CREATININE 3.04* 3.06*  --  2.68*  --   --   ? ? ? ?Estimated Creatinine Clearance: 28.1 mL/min (A) (by C-G formula based on SCr of 2.68 mg/dL (H)). ? ? ?Assessment: ?71 yo M on warfarin PTA for mechanical MVR (INR goal 2.5-3.5). Warfarin has been held  in preparation for RHC/LHC for TAVR work-up. Pharmacy has been consulted to start heparin when INR < 2.5. Patient is now s/p RHC/LHC and is resumed on heparin until warfarin therapeutic. Warfarin resumed 3/20.  ? ?Heparin level remains subtheraepeutic (0.25) on gtt at 1700 units/hr. No issues with line or bleeding reported per RN. ? ?Goal of Therapy:  ?Heparin level 0.3-0.7 units/ml ?Monitor platelets by anticoagulation protocol: Yes ?  ?Plan:  ?Increase heparin gtt to 1850 units/hr ?Heparin level in 8 hours ? ?Sherlon Handing, PharmD, BCPS ?Please see amion for complete clinical pharmacist phone list ?11/28/2021  10:13 PM ? ? ? ? ? ? ?

## 2021-11-28 NOTE — Progress Notes (Addendum)
ANTICOAGULATION CONSULT NOTE - Follow Up Consult ? ?Pharmacy Consult for warfarin >> heparin ?Indication:  mechanical MVR ? ?Allergies  ?Allergen Reactions  ? Allopurinol Shortness Of Breath  ? Doxycycline Hives  ? Wilder Glade [Dapagliflozin]   ?  Can not tolerate - dizzy weak almost passed out  ? ? ?Patient Measurements: ?Height: '5\' 11"'$  (180.3 cm) ?Weight: 89.8 kg (198 lb) ?IBW/kg (Calculated) : 75.3 ?Heparin Dosing Weight: 92.9 kg ? ?Vital Signs: ?Temp: 97.7 ?F (36.5 ?C) (03/21 9150) ?Temp Source: Oral (03/21 5697) ?BP: 114/57 (03/21 0742) ?Pulse Rate: 99 (03/21 0742) ? ?Labs: ?Recent Labs  ?  11/26/21 ?0355 11/26/21 ?0410 11/26/21 ?0410 11/27/21 ?0300 11/27/21 ?1440 11/28/21 ?9480 11/28/21 ?1655  ?HGB  --  10.8*   < > 11.2* 11.6*  11.6* 10.6*  --   ?HCT  --  34.4*   < > 35.8* 34.0*  34.0* 32.9*  --   ?PLT  --  213  --  221  --  238  --   ?LABPROT  --  22.4*  --  21.7*  --   --  20.5*  ?INR  --  2.0*  --  1.9*  --   --  1.8*  ?HEPARINUNFRC 0.41  --   --  0.38  --   --  0.25*  ?CREATININE  --  3.04*  --  3.06*  --  2.68*  --   ? < > = values in this interval not displayed.  ? ? ? ?Estimated Creatinine Clearance: 28.1 mL/min (A) (by C-G formula based on SCr of 2.68 mg/dL (H)). ? ? ?Medical History: ?Past Medical History:  ?Diagnosis Date  ? Abnormal nuclear stress test 05/10/2017  ? Allergy   ? Anemia   ? "lost blood w/the cancer"  ? Arthritis   ? back   ? Atrial fibrillation ([place])   ? takes Coumadin daily  ? Bruises easily   ? takes Coumadin daily  ? CAD (coronary artery disease)   ? a. s/p CABG 11/2000 (L-LAD, S-RCA);  b. Lexiscan Myoview 11/12: EF 45%, ischemia and possibly some scar at the base of the inferolateral wall;   c. Lex MV 11/13:  EF 44%, Inf and IL scar/soft tissue atten, small mild amt of inf ischemia,  ? CHF (congestive heart failure) ([place])   ? Constipation   ? takes Colace daily  ? 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 Enlarged prostate   ? Flu 09/2013  ? GERD (gastroesophageal reflux disease)    ? takes Nexium daily  ? Glaucoma   ? History of blood transfusion   ? "related to cancer"  ? History of colon polyps   ? HLD (hyperlipidemia)   ? takes Vytorin daily  ? HTN (hypertension)   ? takes Amlodipine,Metoprolol, and Ramipril daily  ? Insomnia   ? states he has always been like this.But doesn't take any meds  ? Kidney stones   ? "I passed them all"  ? Nocturia   ? Peripheral edema   ? takes Furosemide daily  ? Peripheral neuropathy   ? Rheumatic fever ~ 1965  ? Rheumatic heart disease   ? a. s/p mechanical (St. Jude) MVR 2002;  b. Echo 5/11: Mild LVH, EF 45-50%, mild AS, mild AI, mean gradient 11 mmHg, MVR with normal gradients, moderate LAE, mild RAE;  c.  Echo 11/13:   mod LVH, EF 55-60%, mild AS (mean 18 mmHg), mild AI, severe LAE, mild RAE, PASP 41  ? 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 SOB (shortness of breath)   ? with exertion  ? Type II diabetes mellitus ([place])   ? takes Januvia,Glipizide,and Metformin daily as well as Lantus  ? Unstable angina ([place]) 06/21/2020  ? ? ?Medications:  ?Infusions:  ? sodium chloride    ? sodium chloride    ? heparin 1,550 Units/hr (11/27/21 2318)  ? milrinone 0.125 mcg/kg/min (11/27/21 1739)  ? ? ?Assessment: ?71 yo M on warfarin PTA for mechanical MVR (INR goal 2.5-3.5). Warfarin has been held  in preparation for RHC/LHC for TAVR work-up. Pharmacy has been consulted to start heparin when INR < 2.5. Patient is now s/p RHC/LHC and is resumed on heparin until warfarin therapeutic. Warfarin resumed 3/20.  ? ?INR has decreased to 1.8 today -- patient had previously been therapeutic on heparin gtt at 1550 units/hr however, heparin level subtherapeutic at 0.25 this morning. CBC stable and no s/x of bleeding per RN. ? ?PTA warfarin dose: 5 mg/day for 5 days then 2.5 x2 days then repeat ? ?Goal of Therapy:  ?Heparin level 0.3-0.7 units/ml ?Monitor platelets by anticoagulation protocol: Yes ?  ?Plan:  ?Bolus heparin 1300 units ?Increase  heparin gtt to 1700 units/hr ?Heparin level in 8 hours ?Order warfarin 5 mg tonight  ?Heparin level and INR daily and CBC daily while on heparin/warfarin ? ?Cathrine Muster, PharmD ?PGY2 Cardiology Pharmacy Resident ?Phone: [phone] ?11/28/2021  9:51 AM ? ?Please check [email or web address] for unit-specific pharmacy phone numbers. ? ? ? ? ? ?

## 2021-11-28 NOTE — Progress Notes (Signed)
Orthopedic Tech Progress Note ?Patient Details:  ?John [name] ?12/11/1950 ?[id number] ? ?Ortho Devices ?Type of Ortho Device: Unna boot ?Ortho Device/Splint Location: BLE ?Ortho Device/Splint Interventions: Ordered, Application, Adjustment ?  ?Post Interventions ?Patient Tolerated: Well ?Instructions Provided: Care of device ? ?Janit Pagan ?11/28/2021, 3:09 PM ? ?

## 2021-11-28 NOTE — Progress Notes (Signed)
Charge RN unable to take BP on the patient at this moment. Patient has restricted L arm, R arm and R leg. Patient L leg has a gout flare up. RN gave prn pain medication. Will inform primary RN and NT. ?

## 2021-11-28 NOTE — Progress Notes (Signed)
Orthopedic Tech Progress Note ?Patient Details:  ?Seraphim L Weiand ?Jan 31, 1951 ?[id number] ? ?Spoke to Network engineer this morning and asked her to reach out to all the RNs who has patients with UNNA BOOTS to let them know ill be there at 3pm to apply UNNA BOOTS to all.  ? ?Patient ID: CUTBERTO WINFREE, male   DOB: May 31, 1951, 71 y.o.   MRN: [id number] ? ?Janit Pagan ?11/28/2021, 11:21 AM ? ?

## 2021-11-28 NOTE — Care Management Important Message (Signed)
Important Message ? ?Patient Details  ?Name: John [name] ?MRN: [id number] ?Date of Birth: 1951-01-10 ? ? ?Medicare Important Message Given:  Yes ? ? ? ? ?[name] ?11/28/2021, 8:08 AM ?

## 2021-11-28 NOTE — Progress Notes (Signed)
CARDIAC REHAB PHASE I  ? ?Offered to walk with pt. Pt declines at this time, states swollen feet and sore knee. Gout pain improved since this morning, but still swollen and red. Pt happy with cath results, glad his valve is in better shape than they thought. Hoping to d/c soon. Stressed importance of daily weights and continued exercise at home. Pt denies further questions or concerns at this time. Will continue to follow. ? ?[id number] ?Rufina Falco, RN BSN ?11/28/2021 ?2:10 PM ? ?

## 2021-11-29 LAB — GLUCOSE, CAPILLARY
Glucose-Capillary: 271 mg/dL — ABNORMAL HIGH (ref 70–99)
Glucose-Capillary: 224 mg/dL — ABNORMAL HIGH (ref 70–99)
Glucose-Capillary: 236 mg/dL — ABNORMAL HIGH (ref 70–99)
Glucose-Capillary: 327 mg/dL — ABNORMAL HIGH (ref 70–99)
Glucose-Capillary: 310 mg/dL — ABNORMAL HIGH (ref 70–99)

## 2021-11-29 LAB — CBC
HCT: 31.9 % — ABNORMAL LOW (ref 39.0–52.0)
Hemoglobin: 9.9 g/dL — ABNORMAL LOW (ref 13.0–17.0)
MCH: 25.5 pg — ABNORMAL LOW (ref 26.0–34.0)
MCHC: 31 g/dL (ref 30.0–36.0)
MCV: 82.2 fL (ref 80.0–100.0)
Platelets: 209 10*3/uL (ref 150–400)
RBC: 3.88 MIL/uL — ABNORMAL LOW (ref 4.22–5.81)
RDW: 20.8 % — ABNORMAL HIGH (ref 11.5–15.5)
WBC: 6 10*3/uL (ref 4.0–10.5)
nRBC: 0 % (ref 0.0–0.2)

## 2021-11-29 LAB — HEPARIN LEVEL (UNFRACTIONATED): Heparin Unfractionated: 0.39 IU/mL (ref 0.30–0.70)

## 2021-11-29 LAB — COOXEMETRY PANEL
Carboxyhemoglobin: 0.8 % (ref 0.5–1.5)
Methemoglobin: <0.7 % (ref 0.0–1.5)
O2 Saturation: 68.6 %
Total hemoglobin: 10.2 g/dL — ABNORMAL LOW (ref 12.0–16.0)

## 2021-11-29 LAB — MAGNESIUM: Magnesium: 2 mg/dL (ref 1.7–2.4)

## 2021-11-29 LAB — BASIC METABOLIC PANEL
Anion gap: 10 (ref 5–15)
BUN: 100 mg/dL — ABNORMAL HIGH (ref 8–23)
CO2: 27 mmol/L (ref 22–32)
Calcium: 8.6 mg/dL — ABNORMAL LOW (ref 8.9–10.3)
Chloride: 95 mmol/L — ABNORMAL LOW (ref 98–111)
Creatinine, Ser: 2.69 mg/dL — ABNORMAL HIGH (ref 0.61–1.24)
GFR, Estimated: 25 mL/min — ABNORMAL LOW (ref >60)
Glucose, Bld: 317 mg/dL — ABNORMAL HIGH (ref 70–99)
Potassium: 4.4 mmol/L (ref 3.5–5.1)
Sodium: 132 mmol/L — ABNORMAL LOW (ref 135–145)

## 2021-11-29 LAB — PROTIME-INR
INR: 2.2 — ABNORMAL HIGH (ref 0.8–1.2)
Prothrombin Time: 24.4 seconds — ABNORMAL HIGH (ref 11.4–15.2)

## 2021-11-29 MED ORDER — PREDNISONE 20 MG PO TABS
40.0000 mg | ORAL_TABLET | Freq: Every day | ORAL | Status: DC
  Administered 2021-11-30: 40 mg via ORAL
  Filled 2021-11-29: qty 2

## 2021-11-29 MED ORDER — METOPROLOL SUCCINATE ER 25 MG PO TB24
12.5000 mg | ORAL_TABLET | Freq: Every day | ORAL | Status: DC
  Administered 2021-11-29 – 2021-12-02 (×4): 12.5 mg via ORAL
  Filled 2021-11-29 (×4): qty 1

## 2021-11-29 MED ORDER — INSULIN ASPART 100 UNIT/ML IJ SOLN
2.0000 [IU] | Freq: Three times a day (TID) | INTRAMUSCULAR | Status: DC
  Administered 2021-11-29 – 2021-11-30 (×3): 2 [IU] via SUBCUTANEOUS

## 2021-11-29 MED ORDER — METOLAZONE 2.5 MG PO TABS
2.5000 mg | ORAL_TABLET | Freq: Once | ORAL | Status: AC
  Administered 2021-11-29: 2.5 mg via ORAL
  Filled 2021-11-29: qty 1

## 2021-11-29 MED ORDER — WARFARIN SODIUM 5 MG PO TABS
5.0000 mg | ORAL_TABLET | Freq: Once | ORAL | Status: AC
  Administered 2021-11-29: 5 mg via ORAL
  Filled 2021-11-29: qty 1

## 2021-11-29 NOTE — Progress Notes (Signed)
PT Cancellation Note ? ?Patient Details ?Name: John [name] ?MRN: [id number] ?DOB: December 29, 1950 ? ? ?Cancelled Treatment:    Reason Eval/Treat Not Completed: Other (comment).  Pt was seen for mobility attempt, but is having gout flare and asked to wait until the AM.  Follow up as time allows. ? ? ?Ramond Dial ?11/29/2021, 4:36 PM ? ?Mee Hives, PT PhD ?Acute Rehab Dept. Number: [facility] [phone] and [place] [phone] ? ?

## 2021-11-29 NOTE — Progress Notes (Addendum)
Patient ID: John [name], male   DOB: [birth year], 71 y.o.   MRN: [id number] ? ? ? ?Advanced Heart Failure Rounding Note ? ? ?Subjective:   ? ?Milrinone restarted 03/20 to facilitate diuresis. Co-ox 68.6% on 0.125 milrinone. CVP 13. 1.9L UOP charted yesterday. Weight up 2 lb. ? ?INR 2.2 on heparin + warfarin. ? ?Scr stable at 2.69.  ? ?Left knee exquisitely tender with palpation or any movement. Unable to bear weight. ? ?No dyspnea at rest. No orthopnea or PND. ? ? ?RHC Procedural Findings: ?Hemodynamics (mmHg) ?RA mean 13 ?RV 67/14 ?PA 65/33, mean 46 ?PCWP mean 22 ?LV 117/25 ?AO 105/52 ?Oxygen saturations: ?PA 59% ?AO 97% ?Cardiac Output (Fick) 4.87  ?Cardiac Index (Fick) 2.3 ?PVR 4.9 WU ?Aortic valve mean gradient 13.6 mmHg, AVA 1.61 cm^2 ? ?Objective:   ? ?Vital Signs:   ?Temp:  [97.6 ?F (36.4 ?C)-98 ?F (36.7 ?C)] 98 ?F (36.7 ?C) (03/22 0431) ?Pulse Rate:  [86-107] 100 (03/22 0431) ?Resp:  [20] (P) 20 (03/22 0746) ?BP: (99-123)/(56-69) 117/56 (03/22 0431) ?SpO2:  [96 %-99 %] 96 % (03/22 0431) ?Weight:  [90.8 kg] 90.8 kg (03/22 0431) ?Last BM Date : 11/28/21 ? ?Weight change: ?Filed Weights  ? 11/27/21 0400 11/28/21 0440 11/29/21 0431  ?Weight: 91.9 kg 89.8 kg 90.8 kg  ? ? ?Intake/Output:  ? ?Intake/Output Summary (Last 24 hours) at 11/29/2021 0816 ?Last data filed at 11/29/2021 1607 ?Gross per 24 hour  ?Intake 820.74 ml  ?Output 1900 ml  ?Net -1079.26 ml  ?  ?PHYSICAL EXAM: ?CVP 13 ?General:  Sitting up on side of bed.  ?HEENT: normal ?Neck: supple. JVP 12-14. Carotids 2+ bilat; no bruits.  ?Cor: PMI nondisplaced. Irregular rhythm. No rubs, gallops or murmurs. ?Lungs: clear ?Abdomen: soft, nontender, nondistended. No hepatosplenomegaly.  ?Extremities: no cyanosis, clubbing, rash, left knee tender and erythematous, UNNA boots on ?Neuro: alert & orientedx3, cranial nerves grossly intact. moves all 4 extremities w/o difficulty. Affect pleasant ? ? ? ?Telemetry:  SR 100s-110s ? ?Labs: ?Basic Metabolic Panel: ?Recent  Labs  ?Lab 11/25/21 ?0425 11/26/21 ?0410 11/27/21 ?0300 11/27/21 ?1440 11/28/21 ?3710 11/29/21 ?0533  ?NA 136 137 135 139  140 137 132*  ?K 4.0 3.9 3.9 3.4*  3.3* 3.7 4.4  ?CL 97* 98 98  --  99 95*  ?CO2 '27 27 25  '$ --  27 27  ?GLUCOSE 64* 68* 87  --  137* 317*  ?BUN 88* 98* 105*  --  102* 100*  ?CREATININE 2.84* 3.04* 3.06*  --  2.68* 2.69*  ?CALCIUM 8.6* 8.3* 8.7*  --  8.7* 8.6*  ?MG 2.2 2.4 2.3  --  2.2 2.0  ? ? ?Liver Function Tests: ?No results for input(s): AST, ALT, ALKPHOS, BILITOT, PROT, ALBUMIN in the last 168 hours. ? ?No results for input(s): LIPASE, AMYLASE in the last 168 hours. ?No results for input(s): AMMONIA in the last 168 hours. ? ?CBC: ?Recent Labs  ?Lab 11/25/21 ?0425 11/26/21 ?0410 11/27/21 ?0300 11/27/21 ?1440 11/28/21 ?6269 11/29/21 ?0533  ?WBC 4.9 4.8 4.9  --  5.1 6.0  ?HGB 11.0* 10.8* 11.2* 11.6*  11.6* 10.6* 9.9*  ?HCT 34.3* 34.4* 35.8* 34.0*  34.0* 32.9* 31.9*  ?MCV 80.1 81.3 82.1  --  80.2 82.2  ?PLT 201 213 221  --  238 209  ? ? ?Cardiac Enzymes: ?No results for input(s): CKTOTAL, CKMB, CKMBINDEX, TROPONINI in the last 168 hours. ? ?BNP: ?BNP (last 3 results) ?Recent Labs  ?  11/17/21 ?1155  ?BNP >4,500.0*  ? ? ?  ProBNP (last 3 results) ?No results for input(s): PROBNP in the last 8760 hours. ? ? ? ?Other results: ? ?Imaging: ?CARDIAC CATHETERIZATION ? ?Result Date: 11/27/2021 ?1. Right and left heart filling pressures remain elevated. 2. 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 3. Preserved cardiac output. 4. Mild aortic stenosis by cath. I do not think this patient will be a good TAVR candidate.  With creatinine still elevated and filling pressures elevated, I will start back on low dose milrinone 0.125 and continue diuresis.   ? ? ?Medications:   ? ? ?Scheduled Medications: ? Chlorhexidine Gluconate Cloth  6 each Topical Daily  ? clopidogrel  75 mg Oral Q breakfast  ? furosemide  80 mg Intravenous BID  ? insulin aspart  0-15 Units Subcutaneous TID WC  ?  insulin aspart  0-5 Units Subcutaneous QHS  ? insulin glargine-yfgn  14 Units Subcutaneous BID  ? pantoprazole  40 mg Oral Daily  ? predniSONE  40 mg Oral Daily  ? rosuvastatin  20 mg Oral Daily  ? sodium chloride flush  10-40 mL Intracatheter Q12H  ? sodium chloride flush  3 mL Intravenous Q12H  ? sodium chloride flush  3 mL Intravenous Q12H  ? sodium chloride flush  3 mL Intravenous Q12H  ? Warfarin - Pharmacist Dosing Inpatient   Does not apply [id number]  ? ? ?Infusions: ? sodium chloride    ? sodium chloride    ? heparin 1,850 Units/hr (11/29/21 0359)  ? milrinone 0.125 mcg/kg/min (11/29/21 0008)  ? ? ?PRN Medications: ?sodium chloride, sodium chloride, acetaminophen, alum & mag hydroxide-simeth, calcium carbonate, diphenhydrAMINE, ondansetron (ZOFRAN) IV, oxyCODONE, sodium chloride flush, sodium chloride flush, sodium chloride flush, traMADol ? ? ?Assessment/Plan:  ? ?1. Acute on chronic systolic CHF: Last echo in 4/21 with EF 25-30%, severe LV dilation, normal RV, mild AS and AI, normal mechanical MV.  St Jude ICD. Suspect ischemic cardiomyopathy.  He is markedly volume overloaded on exam.  AKI at admission, baseline creatinine around 2.1.  Echo this admission with EF 20-25%, RV severely HK, mechanical MV with mean gradient 6, mod-severe TR, moderate AI, ?low flow/low gradient moderate-severe AS with mean gradient 15/AVA 0.96 cm^2.  TEE 3/14 showed EF around 25% but only mildly decreased RV function and trivial TR, normally functioning mechanical MV and abnormal aortic valve with moderate-severe AS and AI. He has diuresed well on milrinone 0.25 + Lasix/metolazone. Milrinone weaned off 3/16.  However, after [place] on 3/20 it was restarted to facilitate further diuresis.  He is currently on milrinone 0.125 with co-ox 68%.  Continues on IV lasix. Weight up 2 lb.  CVP 13. Give 2.5 metolazone once today. ?- Continue milrinone while on IV Lasix.  ?- Off Toprol XL with low output, can now add back at 12.5 mg daily with  elevated HR ?- Other GDMT limited by AKI on CKD stage 3.  ?- Not candidate at this time for LVAD given renal dysfunction.  ?2. CAD: s/p CABG with MVR in 2002.  Last cath 10/21 with occluded SVG-RCA, and 80% mRCA, totally occluded LAD, patent LIMA-LAD, 85% ostial LCx treated with DES.  No chest pain, doubt ACS.  ?- Continue Plavix (?transition ASA in future as >1 year post-PCI).  ?- Continue Crestor.  ?3. Atrial fibrillation: Permanent. Rate 100s-110s ?- Start Toprol XL 12.5 mg daily.  ?- heparin gtt while INR subtherapeutic.  ?4. 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  INR goal  2.5-3.5.  ?- Heparin gtt while warfarin is subtherapeutic.  ?- Warfarin has been restarted, INR 2.2 ?5. AKI on CKD stage 3: Creatinine up to 3.46 at admission, baseline around 2.1.  Suspect cardiorenal.  Creatinine now 3.06 > 2.68 > 2.69 .  ?6. Aortic valve disorder: Abnormal aortic valve, looks functionally bicuspid with fixed noncoronary and right coronary cusps.  Echo with ?moderate-severe AS with AVA 1.2 cm^2 but DI 0.23.  There was moderate-severe AI as well.  Hemodynamic right/left heart cath suggested only mild aortic stenosis. I do not think that he is a TAVR candidate at this time.  ?7. Transaminitis. Mild, likely due to passive congestion  ?- diurese ?8. Left knee pain: Suspected gout flare left knee. On prednisone burst. Uric acid 9.9. Still with significant pain and swelling, concerned about level of ongoing pain. ?- Will consult ortho to assess. Appreciate assistance. ?- No fever or leukocytosis noted. ?- Can have tramadol for mild breakthrough pain, oxycodone for severe.  ? ? ?FINCH, LINDSAY N ?11/29/2021 ?8:16 AM ? ?Patient seen with PA, agree with the above note.  ? ?Today, co-ox 69% on milrinone 0.125.  CVP 13, getting IV Lasix.  Creatinine stable 2.69.   ? ?Main complaint is left knee pain, unable to bear weight currently.   ? ?Remains on heparin gtt  + warfarin with INR 2.2.  ? ?General: NAD ?Neck: JVP 12 cm, no thyromegaly or thyroid nodule.  ?Lungs: Clear to auscultation bilaterally with normal respiratory effort. ?CV: Nondisplaced PMI.  Heart regular S1/S2

## 2021-11-29 NOTE — Progress Notes (Signed)
CARDIAC REHAB PHASE I  ? ?Went to offer to walk with pt. Pt still with gouty knee pain, but improved since yesterday. Reviewed importance of daily weights, monitoring salt intake and ambulation at home with pt and wife. Pt hopeful to d/c soon. Pt assisted to BR with front wheel walker. Pt states he has a walker at home if needed. BR call light within reach. Will continue to follow. ? ?[phone] ?Rufina Falco, RN BSN ?11/29/2021 ?2:30 PM ?

## 2021-11-29 NOTE — Consult Note (Signed)
Reason for Consult:Left knee pain ?Referring Physician: Loralie Champagne ?Time called: 5732 ?Time at bedside: 0919 ? ? ?John [name] is an 71 y.o. male.  ?HPI: John [name] was admitted 11d ago with CHF. In the last 2-3d he's begun to have severe left knee pain. Given his diuresis and elevated uric acid this was attributed to gout and he was started on medication. He has had some improvement but was still very painful and orthopedic surgery was asked to evaluate. He denies prior hx/o knee pain but has had gout before in his left wrist and has a strong family hx/o gout. ? ?Past Medical History:  ?Diagnosis Date  ? Abnormal nuclear stress test 05/10/2017  ? Allergy   ? Anemia   ? "lost blood w/the cancer"  ? Arthritis   ? back   ? Atrial fibrillation ([place])   ? takes Coumadin daily  ? Bruises easily   ? takes Coumadin daily  ? CAD (coronary artery disease)   ? a. s/p CABG 11/2000 (L-LAD, S-RCA);  b. Lexiscan Myoview 11/12: EF 45%, ischemia and possibly some scar at the base of the inferolateral wall;   c. Lex MV 11/13:  EF 44%, Inf and IL scar/soft tissue atten, small mild amt of inf ischemia,  ? CHF (congestive heart failure) ([place])   ? Constipation   ? takes Colace daily  ? 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 Enlarged prostate   ? Flu 09/2013  ? GERD (gastroesophageal reflux disease)   ? takes Nexium daily  ? Glaucoma   ? History of blood transfusion   ? "related to cancer"  ? History of colon polyps   ? HLD (hyperlipidemia)   ? takes Vytorin daily  ? HTN (hypertension)   ? takes Amlodipine,Metoprolol, and Ramipril daily  ? Insomnia   ? states he has always been like this.But doesn't take any meds  ? Kidney stones   ? "I passed them all"  ? Nocturia   ? Peripheral edema   ? takes Furosemide daily  ? Peripheral neuropathy   ? Rheumatic fever ~ 1965  ? Rheumatic heart disease   ? a. s/p mechanical (St. Jude) MVR 2002;  b. Echo 5/11: Mild LVH, EF 45-50%, mild AS, mild AI, mean gradient 11 mmHg, MVR with normal  gradients, moderate LAE, mild RAE;  c.  Echo 11/13:   mod LVH, EF 55-60%, mild AS (mean 18 mmHg), mild AI, severe LAE, mild RAE, PASP 41  ? 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 SOB (shortness of breath)   ? with exertion  ? Type II diabetes mellitus ([place])   ? takes Januvia,Glipizide,and Metformin daily as well as Lantus  ? Unstable angina ([place]) 06/21/2020  ? ? ?Past Surgical History:  ?Procedure Laterality Date  ? APPLICATION OF WOUND VAC  2014  ? CARDIAC CATHETERIZATION  2002  ? COLONOSCOPY    ? CORONARY ARTERY BYPASS GRAFT  2002  ? x 3  ? CORONARY ARTERY BYPASS GRAFT    ? CORONARY ATHERECTOMY N/A 06/24/2020  ? Procedure: CORONARY ATHERECTOMY;  Surgeon: [name], MD;  Location: [place] [facility];  Service: Cardiovascular;  Laterality: N/A;  ? CORONARY BALLOON ANGIOPLASTY N/A 06/24/2020  ? Procedure: CORONARY BALLOON ANGIOPLASTY;  Surgeon: [name], MD;  Location: [place] [facility];  Service: Cardiovascular;  Laterality: N/A;  ? CORONARY STENT INTERVENTION N/A 06/24/2020  ? Procedure: CORONARY STENT INTERVENTION;  Surgeon: [name], MD;  Location:  [place] IN[facility]ASIVE [facility];  Service: Cardiovascular;  Laterality: N/A;  ? ESOPHAGOGASTRODUODENOSCOPY    ? HERNIA REPAIR    ? ICD IMPLANT N/A 02/19/2020  ? Procedure: ICD IMPLANT;  Surgeon: Evans Lance, MD;  Location: [place] [facility];  Service: Cardiovascular;  Laterality: N/A;  ? INCISIONAL HERNIA REPAIR N/A 03/15/2014  ? Procedure: LAPAROSCOPIC INCISIONAL HERNIA;  Surgeon: Harl Bowie, MD;  Location: [place];  Service: General;  Laterality: N/A;  ? INSERTION OF MESH N/A 03/15/2014  ? Procedure: INSERTION OF MESH;  Surgeon: Harl Bowie, MD;  Location: [place];  Service: General;  Laterality: N/A;  ? LAPAROSCOPIC INCISIONAL / UMBILICAL / [place]  03/15/2014  ? IHR  ? LAPAROTOMY N/A 02/09/2013  ? Procedure: EXPLORATORY LAPAROTOMY WITH incisional biopsy intra- ABDOMINAL MASS ILOCECTOMY ;   Surgeon: Harl Bowie, MD;  Location: [facility];  Service: General;  Laterality: N/A;  ? MITRAL VALVE REPLACEMENT  11/2000  ? #33 St. Jude mechanical mitral valve prosthesis. Dr. Servando Snare  ? PORT-A-CATH REMOVAL  03/15/2014  ? PORT-A-CATH REMOVAL Left 03/15/2014  ? Procedure: REMOVAL PORT-A-CATH;  Surgeon: Harl Bowie, MD;  Location: [place];  Service: General;  Laterality: Left;  ? PORTACATH PLACEMENT N/A 02/09/2013  ? Procedure: INSERTION PORT-A-CATH;  Surgeon: Harl Bowie, MD;  Location: [facility];  Service: General;  Laterality: N/A;  ? RIGHT AND LEFT HEART CATH N/A 11/27/2021  ? Procedure: RIGHT AND LEFT HEART CATH;  Surgeon: Larey Dresser, MD;  Location: [place] [facility];  Service: Cardiovascular;  Laterality: N/A;  ? RIGHT/LEFT HEART CATH AND CORONARY/GRAFT ANGIOGRAPHY N/A 05/10/2017  ? Procedure: RIGHT/LEFT HEART CATH AND CORONARY/GRAFT ANGIOGRAPHY;  Surgeon: Martinique, Peter M, MD;  Location: [place] [facility];  Service: Cardiovascular;  Laterality: N/A;  ? RIGHT/LEFT HEART CATH AND CORONARY/GRAFT ANGIOGRAPHY N/A 06/23/2020  ? Procedure: RIGHT/LEFT HEART CATH AND CORONARY/GRAFT ANGIOGRAPHY;  Surgeon: Troy Sine, MD;  Location: [place] [facility];  Service: Cardiovascular;  Laterality: N/A;  ? SEPTOPLASTY    ? SMALL INTESTINE SURGERY    ? TEE WITHOUT CARDIOVERSION N/A 11/21/2021  ? Procedure: TRANSESOPHAGEAL ECHOCARDIOGRAM (TEE);  Surgeon: Larey Dresser, MD;  Location: [facility];  Service: Cardiovascular;  Laterality: N/A;  ? ? ?Family History  ?Problem Relation Age of Onset  ? Diabetes Mother   ? Hypertension Father   ? Thyroid disease Sister   ? Diabetes Brother   ? Arthritis Brother   ? Depression Brother   ? Heart disease Brother   ? Hyperlipidemia Brother   ? Hypertension Brother   ? Hypertension Brother   ? Diabetes Brother   ? Heart disease Brother   ? Hyperlipidemia Brother   ? ? ?Social History:  reports that he has quit smoking. His smoking use included cigars. He has never used  smokeless tobacco. He reports that he does not drink alcohol and does not use drugs. ? ?Allergies:  ?Allergies  ?Allergen Reactions  ? Allopurinol Shortness Of Breath  ? Doxycycline Hives  ? Wilder Glade [Dapagliflozin]   ?  Can not tolerate - dizzy weak almost passed out  ? ? ?Medications: I have reviewed the patient's current medications. ? ?Results for orders placed or performed during the hospital encounter of 11/17/21 (from the past 48 hour(s))  ?Glucose, capillary     Status: Abnormal  ? Collection Time: 11/27/21 11:15 AM  ?Result Value Ref Range  ? Glucose-Capillary 59 (L) 70 - 99 mg/dL  ?  Comment: Glucose  reference range applies only to samples taken after fasting for at least 8 hours.  ?Glucose, capillary     Status: Abnormal  ? Collection Time: 11/27/21 12:11 PM  ?Result Value Ref Range  ? Glucose-Capillary 104 (H) 70 - 99 mg/dL  ?  Comment: Glucose reference range applies only to samples taken after fasting for at least 8 hours.  ?POCT I-Stat EG7     Status: Abnormal  ? Collection Time: 11/27/21  2:40 PM  ?Result Value Ref Range  ? pH, Ven 7.440 (H) 7.25 - 7.43  ? pCO2, Ven 42.3 (L) 44 - 60 mmHg  ? pO2, Ven 30 (LL) 32 - 45 mmHg  ? Bicarbonate 28.7 (H) 20.0 - 28.0 mmol/L  ? TCO2 30 22 - 32 mmol/L  ? O2 Saturation 59 %  ? Acid-Base Excess 4.0 (H) 0.0 - 2.0 mmol/L  ? Sodium 140 135 - 145 mmol/L  ? Potassium 3.3 (L) 3.5 - 5.1 mmol/L  ? Calcium, Ion 1.12 (L) 1.15 - 1.40 mmol/L  ? HCT 34.0 (L) 39.0 - 52.0 %  ? Hemoglobin 11.6 (L) 13.0 - 17.0 g/dL  ? Sample type MIXED VENOUS SAMPLE   ? Comment NOTIFIED PHYSICIAN   ?POCT I-Stat EG7     Status: Abnormal  ? Collection Time: 11/27/21  2:40 PM  ?Result Value Ref Range  ? pH, Ven 7.431 (H) 7.25 - 7.43  ? pCO2, Ven 42.3 (L) 44 - 60 mmHg  ? pO2, Ven 30 (LL) 32 - 45 mmHg  ? Bicarbonate 28.1 (H) 20.0 - 28.0 mmol/L  ? TCO2 29 22 - 32 mmol/L  ? O2 Saturation 58 %  ? Acid-Base Excess 3.0 (H) 0.0 - 2.0 mmol/L  ? Sodium 139 135 - 145 mmol/L  ? Potassium 3.4 (L) 3.5 - 5.1 mmol/L  ?  Calcium, Ion 1.16 1.15 - 1.40 mmol/L  ? HCT 34.0 (L) 39.0 - 52.0 %  ? Hemoglobin 11.6 (L) 13.0 - 17.0 g/dL  ? Sample type MIXED VENOUS SAMPLE   ? Comment NOTIFIED PHYSICIAN   ?Glucose, capillary     Status: Abnormal

## 2021-11-29 NOTE — Discharge Summary (Incomplete)
Advanced Heart Failure Team ? ?Discharge Summary  ? ?Patient ID: John [name] ?MRN: [id number], DOB/AGE: 69/27/1952 69 y.o. Admit date: 11/17/2021 ?D/C date:     11/29/2021  ? ?Primary Discharge Diagnoses:  ?Acute on chronic systolic CHF ?CAD ?Permanent atrial fibrillation ?Mechanical mitral valve ?AKI on CKD IIIb ?Aortic valve disorder ?Transaminitis ?Acute gout flare ? ?Hospital Course:  ?John [name] is a 71 year old with a history of CAD, S/P CABG 2002, GERD, HLD, HTN,  Mechanical MVR 2002, rheumatic fever, DMII, chronic A fib, St Jude ICD, CKD Stage IIIb, and HFrEF. ? ?Developed worsening lower extremity edema, dyspnea and orthopnea X 2 weeks. He was seen by Dr. Percival [name] on 03/10 and direct admitted for a/c systolic CHF. He was started on milrinone to facilitate diuresis. PICC line placed for CVP and co-ox monitoring.  Milrinone weaned off but restarted on 03/20 d/t elevated right and left filling pressures on cath 03/20 (see below). Continued to diurese with IV lasix and metolazone which was later transitioned to po torsemide.  Had AKI with Scr up to 3.46 (baseline 2.1), improved to *** with inotrope support and diuresis. Also had IDA treated with feraheme and mild transaminitis felt to be due to hepatic congestion. GDMT limited by AKI on CKD III. Not candidate for advanced therapies d/t renal impairment. ? ?Echo with EF 20-25%, RV severely HK, mechanical MV with mean gradient 6, moderate to severe TR, moderate AI, ? Low flow/low gradient moderate to severe AS. TEE on 03/14: moderate to severe AS (AVA 1.2 cm2 but DI 0.23) and moderate to severe AI. Structural heart team consulted. To further evaluate he had hemodynamic [facility] 11/27/21 - elevated right and left filling pressures, moderate mixed pulmonary arterial/pulmonary venous hypertension, preserved CO, mild aortic valve stenosis by cath. Not felt to be a good TAVR candidate.  ? ?Coarse complicated by acute gout flare left knee. Treated with coarse of steroids  but continued to have significant pain.  Orthopedics consulted, pain felt to be due to gout. Offered steroid injection + aspiration, but patient opted for medical management. Recommended to continue prednisone until pain significantly improved followed by 10 day taper. ? ? ?  ? ?Discharge Weight Range: *** ?Discharge Vitals: Blood pressure 111/66, pulse 100, temperature 98.6 ?F (37 ?C), temperature source Oral, resp. rate 20, height '5\' 11"'$  (1.803 m), weight 90.8 kg, SpO2 98 %. ? ?Labs: ?Lab Results  ?Component Value Date  ? WBC 6.0 11/29/2021  ? HGB 9.9 (L) 11/29/2021  ? HCT 31.9 (L) 11/29/2021  ? MCV 82.2 11/29/2021  ? PLT 209 11/29/2021  ?  ?Recent Labs  ?Lab 11/29/21 ?0533  ?NA 132*  ?K 4.4  ?CL 95*  ?CO2 27  ?BUN 100*  ?CREATININE 2.69*  ?CALCIUM 8.6*  ?GLUCOSE 317*  ? ?Lab Results  ?Component Value Date  ? CHOL 111 06/09/2021  ? HDL 30 (L) 06/09/2021  ? [place] 65 06/09/2021  ? TRIG 79 06/09/2021  ? ?BNP (last 3 results) ?Recent Labs  ?  11/17/21 ?1155  ?BNP >4,500.0*  ? ? ?ProBNP (last 3 results) ?No results for input(s): PROBNP in the last 8760 hours. ? ? ?Diagnostic Studies/Procedures  ? ?Hemodynamic R/LHC, 11/27/21 ?Right Heart Pressures RHC Procedural Findings: ?Hemodynamics (mmHg) ?RA mean 13 ?RV 67/14 ?PA 65/33, mean 46 ?PCWP mean 22 ?LV 117/25 ?AO 105/52 ? ?Oxygen saturations: ?PA 59% ?AO 97% ? ?Cardiac Output (Fick) 4.87  ?Cardiac Index (Fick) 2.3 ?PVR 4.9 WU ? ?Aortic valve mean gradient 13.6 mmHg, AVA 1.61 cm^2  ? ?  1. Right and left heart filling pressures remain elevated.  ?2. 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  ?3. Preserved cardiac output.  ?4. Mild aortic stenosis by cath.  ?  ?I do not think this patient will be a good TAVR candidate.  With creatinine still elevated and filling pressures elevated, I will start back on low dose milrinone 0.125 and continue diuresis.  ? ?TEE, 11/21/21: ?Findings: Please see echo section for full report. Moderate LV dilation with EF  25%, diffuse hypokinesis. Mobile structure in the LV, suspect is the head of the posteromedial papillary muscle and not a thrombus.  Normal RV size with mildly decreased systolic function.  ICD in RV with trivial TR. Moderate LA enlargement, very small LA appendage (?partially clipped at surgery) with no thrombus noted.  Mild right atrial enlargement.  No PFO/ASD by color doppler. Mechanical mitral valve mean gradient 5 mmHg.  Valve opens well with minimal (expected) John (normal function).  The aortic valve has heavily calcified and fixed noncoronary and right coronary cusps, appears functionally bicuspid.  Mean gradient 18 mmHg with AVA 1.2 cm^2 with dimensionless index 0.23. Moderate-severe low flow/low gradient aortic stenosis.  Moderate-severe aortic insufficiency with dimensionless index 300 msec and holodiastolic flow reversal in the ascending thoracic aorta.  Normal caliber thoracic aorta. IVC small, suggesting CVP <5.  ? ?Echo, 11/17/21: ?IMPRESSIONS  ? 1. Left ventricular ejection fraction, by estimation, is 20 to 25%. The left ventricle has severely decreased function. The left ventricle demonstrates global hypokinesis. The left ventricular internal cavity size  ?was moderately dilated. Left ventricular diastolic parameters are indeterminate. No LV thrombus.  ? 2. Right ventricular systolic function is severely reduced. The right ventricular size is normal. There is severely elevated pulmonary artery systolic pressure. The estimated right ventricular systolic pressure is 50.3 mmHg.  ? 3. Left atrial size was severely dilated.  ? 4. Right atrial size was mildly dilated.  ? 5. Moderate pleural effusion in the left lateral region.  ? 6. The mitral valve has been replaced by a mechanical bileafelt valve. No evidence of mitral valve regurgitation, no PVL.  ? 7. Tricuspid valve regurgitation is moderate to severe. There is systolic flow reversal of the hepatic vein.  ? 8. The aortic valve is calcified. Aortic  valve regurgitation is moderate. Moderate aortic valve stenosis. Aortic valve mean gradient measures 15.0  ?mmHg. There is a component of low flow low gradient aortic stenosis.  ? 9. The inferior vena cava is dilated in size with >50% respiratory variability, suggesting right atrial pressure of 8 mmHg.  ? ? ? ?Discharge Medications  ? ?Allergies as of 11/29/2021   ? ?   Reactions  ? Allopurinol Shortness Of Breath  ? Doxycycline Hives  ? Wilder Glade [dapagliflozin]   ? Can not tolerate - dizzy weak almost passed out  ? ?  ?Med Rec must be completed prior to using this [place]*** ? ? ? ? ? ? ?Disposition  ? ?The patient will be discharged in stable condition to home. ? ?  ? ? ?Duration of Discharge Encounter: Greater than 35 minutes  ? ?Signed, ?Everlie Eble N  ?11/29/2021, 2:52 PM  ?

## 2021-11-29 NOTE — Progress Notes (Signed)
ANTICOAGULATION CONSULT NOTE - Follow Up Consult ? ?Pharmacy Consult for warfarin >> heparin ?Indication:  mechanical MVR ? ?Allergies  ?Allergen Reactions  ? Allopurinol Shortness Of Breath  ? Doxycycline Hives  ? Wilder Glade [Dapagliflozin]   ?  Can not tolerate - dizzy weak almost passed out  ? ? ?Patient Measurements: ?Height: '5\' 11"'$  (180.3 cm) ?Weight: 90.8 kg (200 lb 3.2 oz) ?IBW/kg (Calculated) : 75.3 ?Heparin Dosing Weight: 92.9 kg ? ?Vital Signs: ?Temp: 98 ?F (36.7 ?C) (03/22 0431) ?Temp Source: Oral (03/22 0431) ?BP: 120/70 (03/22 0821) ?Pulse Rate: 110 (03/22 0746) ? ?Labs: ?Recent Labs  ?  11/27/21 ?0300 11/27/21 ?1440 11/28/21 ?6644 11/28/21 ?0347 11/28/21 ?2124 11/29/21 ?0533  ?HGB 11.2* 11.6*  11.6* 10.6*  --   --  9.9*  ?HCT 35.8* 34.0*  34.0* 32.9*  --   --  31.9*  ?PLT 221  --  238  --   --  209  ?LABPROT 21.7*  --   --  20.5*  --  24.4*  ?INR 1.9*  --   --  1.8*  --  2.2*  ?HEPARINUNFRC 0.38  --   --  0.25* 0.25* 0.39  ?CREATININE 3.06*  --  2.68*  --   --  2.69*  ? ? ? ?Estimated Creatinine Clearance: 30.3 mL/min (A) (by C-G formula based on SCr of 2.69 mg/dL (H)). ? ? ?Medical History: ?Past Medical History:  ?Diagnosis Date  ? Abnormal nuclear stress test 05/10/2017  ? Allergy   ? Anemia   ? "lost blood w/the cancer"  ? Arthritis   ? back   ? Atrial fibrillation ([place])   ? takes Coumadin daily  ? Bruises easily   ? takes Coumadin daily  ? CAD (coronary artery disease)   ? a. s/p CABG 11/2000 (L-LAD, S-RCA);  b. Lexiscan Myoview 11/12: EF 45%, ischemia and possibly some scar at the base of the inferolateral wall;   c. Lex MV 11/13:  EF 44%, Inf and IL scar/soft tissue atten, small mild amt of inf ischemia,  ? CHF (congestive heart failure) ([place])   ? Constipation   ? takes Colace daily  ? 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 Enlarged prostate   ? Flu 09/2013  ? GERD (gastroesophageal reflux disease)   ? takes Nexium daily  ? Glaucoma   ? History of blood transfusion   ? "related to cancer"  ?  History of colon polyps   ? HLD (hyperlipidemia)   ? takes Vytorin daily  ? HTN (hypertension)   ? takes Amlodipine,Metoprolol, and Ramipril daily  ? Insomnia   ? states he has always been like this.But doesn't take any meds  ? Kidney stones   ? "I passed them all"  ? Nocturia   ? Peripheral edema   ? takes Furosemide daily  ? Peripheral neuropathy   ? Rheumatic fever ~ 1965  ? Rheumatic heart disease   ? a. s/p mechanical (St. Jude) MVR 2002;  b. Echo 5/11: Mild LVH, EF 45-50%, mild AS, mild AI, mean gradient 11 mmHg, MVR with normal gradients, moderate LAE, mild RAE;  c.  Echo 11/13:   mod LVH, EF 55-60%, mild AS (mean 18 mmHg), mild AI, severe LAE, mild RAE, PASP 41  ? 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 SOB (shortness of breath)   ? with exertion  ? Type II diabetes mellitus ([place])   ? takes Januvia,Glipizide,and Metformin daily as well as  Lantus  ? Unstable angina ([place]) 06/21/2020  ? ? ?Medications:  ?Infusions:  ? sodium chloride    ? sodium chloride    ? heparin 1,850 Units/hr (11/29/21 0359)  ? milrinone 0.125 mcg/kg/min (11/29/21 0008)  ? ? ?Assessment: ?71 yo M on warfarin PTA for mechanical MVR (INR goal 2.5-3.5). Warfarin has been held  in preparation for RHC/LHC for TAVR work-up. Pharmacy has been consulted to start heparin when INR < 2.5. Patient is now s/p RHC/LHC and is resumed on heparin until warfarin therapeutic. Warfarin resumed 3/20.  ? ?INR has increased to 2.2 from 1.8 today (plan to continue steroids until symptoms improve then 10 day taper, will monitor closely given potential steroid impact on INR). Heparin level came back therapeutic at 0.39, on 1850 units/hr. Hgb 9.9, plt 209. No s/sx of bleeding or infusion issues. ? ?PTA warfarin dose: 5 mg/day for 5 days then 2.5 x2 days then repeat ? ?Goal of Therapy:  ?Heparin level 0.3-0.7 units/ml ?Monitor platelets by anticoagulation protocol: Yes ?  ?Plan:  ?Continue heparin infusion at 1850 units/hr   ?Order warfarin 5 mg tonight  ?Heparin level and INR daily and CBC daily while on heparin/warfarin ? ?Antonietta Jewel, PharmD, BCCCP ?Clinical Pharmacist  ?Phone: [phone] ?11/29/2021 10:43 AM ? ?Please check AMION for all [place] phone numbers ?After 10:00 PM, call [place] [phone] ? ? ? ? ? ? ?

## 2021-11-30 LAB — COMPREHENSIVE METABOLIC PANEL
ALT: 17 U/L (ref 0–44)
AST: 24 U/L (ref 15–41)
Albumin: 2.8 g/dL — ABNORMAL LOW (ref 3.5–5.0)
Alkaline Phosphatase: 164 U/L — ABNORMAL HIGH (ref 38–126)
Anion gap: 11 (ref 5–15)
BUN: 107 mg/dL — ABNORMAL HIGH (ref 8–23)
CO2: 28 mmol/L (ref 22–32)
Calcium: 8.7 mg/dL — ABNORMAL LOW (ref 8.9–10.3)
Chloride: 97 mmol/L — ABNORMAL LOW (ref 98–111)
Creatinine, Ser: 2.88 mg/dL — ABNORMAL HIGH (ref 0.61–1.24)
GFR, Estimated: 23 mL/min — ABNORMAL LOW (ref >60)
Glucose, Bld: 218 mg/dL — ABNORMAL HIGH (ref 70–99)
Potassium: 4.3 mmol/L (ref 3.5–5.1)
Sodium: 136 mmol/L (ref 135–145)
Total Bilirubin: 1.1 mg/dL (ref 0.3–1.2)
Total Protein: 6 g/dL — ABNORMAL LOW (ref 6.5–8.1)

## 2021-11-30 LAB — FERRITIN: Ferritin: 58 ng/mL (ref 24–336)

## 2021-11-30 LAB — HEPARIN LEVEL (UNFRACTIONATED): Heparin Unfractionated: 0.52 IU/mL (ref 0.30–0.70)

## 2021-11-30 LAB — COOXEMETRY PANEL
Carboxyhemoglobin: 0.5 % (ref 0.5–1.5)
Methemoglobin: <0.7 % (ref 0.0–1.5)
O2 Saturation: 65.4 %
Total hemoglobin: 10.2 g/dL — ABNORMAL LOW (ref 12.0–16.0)

## 2021-11-30 LAB — CBC
HCT: 31 % — ABNORMAL LOW (ref 39.0–52.0)
Hemoglobin: 9.9 g/dL — ABNORMAL LOW (ref 13.0–17.0)
MCH: 26.1 pg (ref 26.0–34.0)
MCHC: 31.9 g/dL (ref 30.0–36.0)
MCV: 81.6 fL (ref 80.0–100.0)
Platelets: 250 10*3/uL (ref 150–400)
RBC: 3.8 MIL/uL — ABNORMAL LOW (ref 4.22–5.81)
RDW: 20.7 % — ABNORMAL HIGH (ref 11.5–15.5)
WBC: 6.5 10*3/uL (ref 4.0–10.5)
nRBC: 0 % (ref 0.0–0.2)

## 2021-11-30 LAB — IRON AND TIBC
Iron: 27 ug/dL — ABNORMAL LOW (ref 45–182)
Saturation Ratios: 7 % — ABNORMAL LOW (ref 17.9–39.5)
TIBC: 393 ug/dL (ref 250–450)
UIBC: 366 ug/dL

## 2021-11-30 LAB — PROTIME-INR
INR: 2.6 — ABNORMAL HIGH (ref 0.8–1.2)
Prothrombin Time: 27.6 seconds — ABNORMAL HIGH (ref 11.4–15.2)

## 2021-11-30 LAB — GLUCOSE, CAPILLARY
Glucose-Capillary: 205 mg/dL — ABNORMAL HIGH (ref 70–99)
Glucose-Capillary: 192 mg/dL — ABNORMAL HIGH (ref 70–99)
Glucose-Capillary: 204 mg/dL — ABNORMAL HIGH (ref 70–99)
Glucose-Capillary: 303 mg/dL — ABNORMAL HIGH (ref 70–99)

## 2021-11-30 LAB — MAGNESIUM: Magnesium: 2.1 mg/dL (ref 1.7–2.4)

## 2021-11-30 MED ORDER — METOLAZONE 5 MG PO TABS
5.0000 mg | ORAL_TABLET | Freq: Once | ORAL | Status: AC
  Administered 2021-11-30: 5 mg via ORAL
  Filled 2021-11-30: qty 1

## 2021-11-30 MED ORDER — INSULIN GLARGINE-YFGN 100 UNIT/ML ~~LOC~~ SOLN
16.0000 [IU] | Freq: Two times a day (BID) | SUBCUTANEOUS | Status: DC
  Administered 2021-11-30 – 2021-12-02 (×4): 16 [IU] via SUBCUTANEOUS
  Filled 2021-11-30 (×5): qty 0.16

## 2021-11-30 MED ORDER — SODIUM CHLORIDE 0.9 % IV SOLN
510.0000 mg | Freq: Once | INTRAVENOUS | Status: AC
  Administered 2021-11-30: 510 mg via INTRAVENOUS
  Filled 2021-11-30: qty 17

## 2021-11-30 MED ORDER — WARFARIN SODIUM 5 MG PO TABS
5.0000 mg | ORAL_TABLET | Freq: Once | ORAL | Status: AC
  Administered 2021-11-30: 5 mg via ORAL
  Filled 2021-11-30: qty 1

## 2021-11-30 MED ORDER — INSULIN ASPART 100 UNIT/ML IJ SOLN
4.0000 [IU] | Freq: Three times a day (TID) | INTRAMUSCULAR | Status: DC
  Administered 2021-11-30 – 2021-12-02 (×5): 4 [IU] via SUBCUTANEOUS

## 2021-11-30 MED ORDER — FUROSEMIDE 10 MG/ML IJ SOLN
12.0000 mg/h | INTRAVENOUS | Status: DC
  Administered 2021-11-30: 12 mg/h via INTRAVENOUS
  Filled 2021-11-30 (×2): qty 20

## 2021-11-30 NOTE — Progress Notes (Signed)
ANTICOAGULATION CONSULT NOTE - Follow Up Consult ? ?Pharmacy Consult for warfarin + heparin ?Indication:  mechanical MVR ? ?Allergies  ?Allergen Reactions  ? Allopurinol Shortness Of Breath  ? Doxycycline Hives  ? Wilder Glade [Dapagliflozin]   ?  Can not tolerate - dizzy weak almost passed out  ? ? ?Patient Measurements: ?Height: '5\' 11"'$  (180.3 cm) ?Weight: 90.4 kg (199 lb 3.2 oz) (scale c) ?IBW/kg (Calculated) : 75.3 ?Heparin Dosing Weight: 92.9 kg ? ?Vital Signs: ?Temp: 98.4 ?F (36.9 ?C) (03/23 0442) ?Temp Source: Oral (03/23 0442) ?BP: 111/67 (03/23 0442) ?Pulse Rate: 94 (03/23 0442) ? ?Labs: ?Recent Labs  ?  11/28/21 ?4132 11/28/21 ?4401 11/28/21 ?0272 11/28/21 ?2124 11/29/21 ?5366 11/30/21 ?0410  ?HGB 10.6*  --   --   --  9.9* 9.9*  ?HCT 32.9*  --   --   --  31.9* 31.0*  ?PLT 238  --   --   --  209 250  ?LABPROT  --  20.5*  --   --  24.4* 27.6*  ?INR  --  1.8*  --   --  2.2* 2.6*  ?HEPARINUNFRC  --  0.25*   < > 0.25* 0.39 0.52  ?CREATININE 2.68*  --   --   --  2.69* 2.88*  ? < > = values in this interval not displayed.  ? ? ? ?Estimated Creatinine Clearance: 28.2 mL/min (A) (by C-G formula based on SCr of 2.88 mg/dL (H)). ? ? ?Medical History: ?Past Medical History:  ?Diagnosis Date  ? Abnormal nuclear stress test 05/10/2017  ? Allergy   ? Anemia   ? "lost blood w/the cancer"  ? Arthritis   ? back   ? Atrial fibrillation ([place])   ? takes Coumadin daily  ? Bruises easily   ? takes Coumadin daily  ? CAD (coronary artery disease)   ? a. s/p CABG 11/2000 (L-LAD, S-RCA);  b. Lexiscan Myoview 11/12: EF 45%, ischemia and possibly some scar at the base of the inferolateral wall;   c. Lex MV 11/13:  EF 44%, Inf and IL scar/soft tissue atten, small mild amt of inf ischemia,  ? CHF (congestive heart failure) ([place])   ? Constipation   ? takes Colace daily  ? 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 Enlarged prostate   ? Flu 09/2013  ? GERD (gastroesophageal reflux disease)   ? takes Nexium daily  ? Glaucoma   ? History of  blood transfusion   ? "related to cancer"  ? History of colon polyps   ? HLD (hyperlipidemia)   ? takes Vytorin daily  ? HTN (hypertension)   ? takes Amlodipine,Metoprolol, and Ramipril daily  ? Insomnia   ? states he has always been like this.But doesn't take any meds  ? Kidney stones   ? "I passed them all"  ? Nocturia   ? Peripheral edema   ? takes Furosemide daily  ? Peripheral neuropathy   ? Rheumatic fever ~ 1965  ? Rheumatic heart disease   ? a. s/p mechanical (St. Jude) MVR 2002;  b. Echo 5/11: Mild LVH, EF 45-50%, mild AS, mild AI, mean gradient 11 mmHg, MVR with normal gradients, moderate LAE, mild RAE;  c.  Echo 11/13:   mod LVH, EF 55-60%, mild AS (mean 18 mmHg), mild AI, severe LAE, mild RAE, PASP 41  ? 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 SOB (shortness of breath)   ? with exertion  ? Type  II diabetes mellitus ([place])   ? takes Januvia,Glipizide,and Metformin daily as well as Lantus  ? Unstable angina ([place]) 06/21/2020  ? ? ?Medications:  ?Infusions:  ? sodium chloride    ? sodium chloride    ? heparin 1,850 Units/hr (11/29/21 1914)  ? milrinone 0.125 mcg/kg/min (11/29/21 1247)  ? ? ?Assessment: ?71 yo M on warfarin PTA for mechanical MVR (INR goal 2.5-3.5). Warfarin has been held  in preparation for RHC/LHC for TAVR work-up. Pharmacy has been consulted to start heparin when INR < 2.5. Patient is now s/p RHC/LHC and is resumed on heparin until warfarin therapeutic. Warfarin resumed 3/20.  ? ?INR has increased to 2.6 from 2.2 (plan to continue steroids until symptoms improve then 10 day taper, will monitor closely given potential steroid impact on INR). Heparin level came back therapeutic at 0.52, on 1850 units/hr. Hgb 9.9, plt 250. No s/sx of bleeding or infusion issues. ? ?PTA warfarin dose: 5 mg/day for 5 days then 2.5 x2 days then repeat ? ?Goal of Therapy:  ?Heparin level 0.3-0.7 units/ml ?Monitor platelets by anticoagulation protocol: Yes ?  ?Plan:  ?Stop  heparin ?Order warfarin 5 mg tonight  ?Heparin level and INR daily and CBC daily while on heparin/warfarin ? ?Cathrine Muster, PharmD ?PGY2 Cardiology Pharmacy Resident ?Phone: [phone] ?11/30/2021  6:56 AM ? ?Please check [email or web address] for unit-specific pharmacy phone numbers. ? ? ? ? ? ? ? ?

## 2021-11-30 NOTE — Progress Notes (Addendum)
Patient ID: John [name], male   DOB: 04-26-1951, 71 y.o.   MRN: [id number] ? ? ? ?Advanced Heart Failure Rounding Note ? ? ?Subjective:   ? ?Milrinone restarted 03/20 to facilitate diuresis.  ? ?Co-ox 65% on milrinone 0.125. CVP 15. 2.2L UOP charted yesterday, -800 cc. On 80 mg lasix IV BID + 2.5 mg metolazone X 1.  ? ?INR 2.6 on heparin + warfarin. ? ?Scr up slightly, 2.69>2.88. ? ?Iron stores low. ? ?Gout pain left knee improving. ? ? ?RHC Procedural Findings: ?Hemodynamics (mmHg) ?RA mean 13 ?RV 67/14 ?PA 65/33, mean 46 ?PCWP mean 22 ?LV 117/25 ?AO 105/52 ?Oxygen saturations: ?PA 59% ?AO 97% ?Cardiac Output (Fick) 4.87  ?Cardiac Index (Fick) 2.3 ?PVR 4.9 WU ?Aortic valve mean gradient 13.6 mmHg, AVA 1.61 cm^2 ? ?Objective:   ? ?Vital Signs:   ?Temp:  [98 ?F (36.7 ?C)-98.6 ?F (37 ?C)] 98 ?F (36.7 ?C) (03/23 3704) ?Pulse Rate:  [94-108] 97 (03/23 0806) ?Resp:  [16-20] 16 (03/23 0806) ?BP: (106-117)/(56-67) 117/58 (03/23 8889) ?SpO2:  [96 %-99 %] 99 % (03/23 0806) ?Weight:  [90.4 kg] 90.4 kg (03/23 0442) ?Last BM Date : 11/28/21 ? ?Weight change: ?Filed Weights  ? 11/28/21 0440 11/29/21 0431 11/30/21 0442  ?Weight: 89.8 kg 90.8 kg 90.4 kg  ? ? ?Intake/Output:  ? ?Intake/Output Summary (Last 24 hours) at 11/30/2021 0830 ?Last data filed at 11/30/2021 0803 ?Gross per 24 hour  ?Intake 1481.27 ml  ?Output 2200 ml  ?Net -718.73 ml  ?  ?PHYSICAL EXAM: ?CVP 15 ?General:  No distress. Lying in bed. ?HEENT: normal ?Neck: JVP 12 cm. Carotids 2+ bilat; no bruits.  ?Cor: PMI nondisplaced. Mechanical S1. Irregular rhythm. No rubs, gallops, I/VI AS murmur ?Lungs: clear ?Abdomen: soft, nontender, nondistended. No hepatosplenomegaly.  ?Extremities: no cyanosis, clubbing, rash, 2+ edema, + UNNA, left knee warm but less swollen, LUE PICC ?Neuro: alert & orientedx3, cranial nerves grossly intact. moves all 4 extremities w/o difficulty. Affect pleasant ? ? ? ? ?Telemetry:  AF 90s - low 100s, 3-4 PVCs/min ? ?Labs: ?Basic Metabolic  Panel: ?Recent Labs  ?Lab 11/26/21 ?0410 11/27/21 ?0300 11/27/21 ?1440 11/28/21 ?1694 11/29/21 ?5038 11/30/21 ?0410  ?NA 137 135 139  140 137 132* 136  ?K 3.9 3.9 3.4*  3.3* 3.7 4.4 4.3  ?CL 98 98  --  99 95* 97*  ?CO2 27 25  --  '27 27 28  '$ ?GLUCOSE 68* 87  --  137* 317* 218*  ?BUN 98* 105*  --  102* 100* 107*  ?CREATININE 3.04* 3.06*  --  2.68* 2.69* 2.88*  ?CALCIUM 8.3* 8.7*  --  8.7* 8.6* 8.7*  ?MG 2.4 2.3  --  2.2 2.0 2.1  ? ? ?Liver Function Tests: ?Recent Labs  ?Lab 11/30/21 ?0410  ?AST 24  ?ALT 17  ?ALKPHOS 164*  ?BILITOT 1.1  ?PROT 6.0*  ?ALBUMIN 2.8*  ? ? ?No results for input(s): LIPASE, AMYLASE in the last 168 hours. ?No results for input(s): AMMONIA in the last 168 hours. ? ?CBC: ?Recent Labs  ?Lab 11/26/21 ?0410 11/27/21 ?0300 11/27/21 ?1440 11/28/21 ?8828 11/29/21 ?0034 11/30/21 ?0410  ?WBC 4.8 4.9  --  5.1 6.0 6.5  ?HGB 10.8* 11.2* 11.6*  11.6* 10.6* 9.9* 9.9*  ?HCT 34.4* 35.8* 34.0*  34.0* 32.9* 31.9* 31.0*  ?MCV 81.3 82.1  --  80.2 82.2 81.6  ?PLT 213 221  --  238 209 250  ? ? ?Cardiac Enzymes: ?No results for input(s): CKTOTAL, CKMB, CKMBINDEX, TROPONINI in the  last 168 hours. ? ?BNP: ?BNP (last 3 results) ?Recent Labs  ?  11/17/21 ?1155  ?BNP >4,500.0*  ? ? ?ProBNP (last 3 results) ?No results for input(s): PROBNP in the last 8760 hours. ? ? ? ?Other results: ? ?Imaging: ?No results found. ? ? ?Medications:   ? ? ?Scheduled Medications: ? Chlorhexidine Gluconate Cloth  6 each Topical Daily  ? clopidogrel  75 mg Oral Q breakfast  ? furosemide  80 mg Intravenous BID  ? insulin aspart  0-15 Units Subcutaneous TID WC  ? insulin aspart  0-5 Units Subcutaneous QHS  ? insulin aspart  2 Units Subcutaneous TID WC  ? insulin glargine-yfgn  14 Units Subcutaneous BID  ? metoprolol succinate  12.5 mg Oral Daily  ? pantoprazole  40 mg Oral Daily  ? predniSONE  40 mg Oral Daily  ? rosuvastatin  20 mg Oral Daily  ? sodium chloride flush  10-40 mL Intracatheter Q12H  ? sodium chloride flush  3 mL Intravenous  Q12H  ? sodium chloride flush  3 mL Intravenous Q12H  ? sodium chloride flush  3 mL Intravenous Q12H  ? Warfarin - Pharmacist Dosing Inpatient   Does not apply [id number]  ? ? ?Infusions: ? sodium chloride    ? sodium chloride    ? heparin 1,850 Units/hr (11/29/21 1914)  ? milrinone 0.125 mcg/kg/min (11/29/21 1247)  ? ? ?PRN Medications: ?sodium chloride, sodium chloride, acetaminophen, alum & mag hydroxide-simeth, calcium carbonate, diphenhydrAMINE, ondansetron (ZOFRAN) IV, oxyCODONE, sodium chloride flush, sodium chloride flush, sodium chloride flush, traMADol ? ? ?Assessment/Plan:  ? ?1. Acute on chronic systolic CHF: Last echo in 4/21 with EF 25-30%, severe LV dilation, normal RV, mild AS and AI, normal mechanical MV.  St Jude ICD. Suspect ischemic cardiomyopathy.  He is markedly volume overloaded on exam.  AKI at admission, baseline creatinine around 2.1.  Echo this admission with EF 20-25%, RV severely HK, mechanical MV with mean gradient 6, mod-severe TR, moderate AI, ?low flow/low gradient moderate-severe AS with mean gradient 15/AVA 0.96 cm^2.  TEE 3/14 showed EF around 25% but only mildly decreased RV function and trivial TR, normally functioning mechanical MV and abnormal aortic valve with moderate-severe AS and AI. He has diuresed well on milrinone 0.25 + Lasix/metolazone. Milrinone weaned off 3/16.  However, after [place] on 3/20 it was restarted to facilitate further diuresis.  He is currently on milrinone 0.125 with co-ox 65%.  Continues on IV lasix + 2.5 mg metolazone X 1. 2.2L UOP noted but only 800 cc negative. ?- CVP 15. Still appears volume up. Will give larger dose of metolazone, 5 mg. ? Lasix gtt. ?- Continue milrinone while diuresing. ?- Continue low-dose Toprol XL for rate control ?- Other GDMT limited by AKI on CKD stage 3.  ?- Not candidate at this time for LVAD given renal dysfunction.  ?2. CAD: s/p CABG with MVR in 2002.  Last cath 10/21 with occluded SVG-RCA, and 80% mRCA, totally occluded LAD,  patent LIMA-LAD, 85% ostial LCx treated with DES.  No chest pain, doubt ACS.  ?- Continue Plavix (?transition ASA in future as >1 year post-PCI).  ?- Continue Crestor.  ?3. Atrial fibrillation: Permanent. Rate 90s-100s. ?- Continue Toprol XL 12.5 mg daily.  ?- Stop heparin gtt. INR 2.6. On warfarin. ?4. 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  INR goal 2.5-3.5.  ?- Warfarin has been restarted, INR 2.6. Can stop heparin gtt. ?5. AKI  on CKD stage 3: Creatinine up to 3.46 at admission, baseline around 2.1.  Suspect cardiorenal.  Creatinine now 3.06 > 2.68 > 2.69 > 2.88.  ?- Follow with diuresis. ?6. Aortic valve disorder: Abnormal aortic valve, looks functionally bicuspid with fixed noncoronary and right coronary cusps.  Echo with ?moderate-severe AS with AVA 1.2 cm^2 but DI 0.23.  There was moderate-severe AI as well.  Hemodynamic right/left heart cath suggested only mild aortic stenosis. I do not think that he is a TAVR candidate at this time.  ?7. Transaminitis. Mild, likely due to passive congestion  ?- diurese ?8. Left knee pain: Acute gout flare. On prednisone. Uric acid 9.9.  ?- Ortho consulted. Offered aspiration and injection. Patient declined. Recommended continuing prednisone until significantly improved followed by 10 day taper. ?- Can have tramadol for mild breakthrough pain, oxycodone for severe.  ?9. IDA: ?- Hgb 9.9. Stable. ?- Iron stores low.  ?- Give feraheme ? ?Encouraged mobility today. ? ? ?FINCH, LINDSAY N ?11/30/2021 ?8:30 AM ? ?Patient seen with PA, agree with the above note.  ? ?Patient's knee is doing better, he was able to walk in the hall today.  However, CVP remains elevated at 15 with co-ox 65% on milrinone 0.125. Creatinine higher at 2.88.  Some diuresis yesterday, weight down 1 lb.  ? ?General: NAD ?Neck: JVP 14 cm, no thyromegaly or thyroid nodule.  ?Lungs: Clear to auscultation bilaterally with normal  respiratory effort. ?CV: Nondisplaced PMI.  Heart irregular S1/S2, no S3/S4, 2/6 SEM RUSB.  1+ edema to knees.  ?Abdomen: Soft, nontender, no hepatosplenomegaly, no distention.  ?Skin: Intact without lesions or rashes.  ?

## 2021-11-30 NOTE — Progress Notes (Signed)
Mobility Specialist Progress Note: ? ? 11/30/21 1030  ?Mobility  ?Activity Ambulated with assistance in hallway  ?Level of Assistance Standby assist, set-up cues, supervision of patient - no hands on  ?Assistive Device Other (Comment) ?(IV Pole)  ?Distance Ambulated (ft) 500 ft  ?Activity Response Tolerated well  ?$Mobility charge 1 Mobility  ? ?Pt eager for mobility session this am. No physical assistance required throughout. No pain noted during session. Pt left in room, OT in for session. ? ?Nelta Numbers ?Acute Rehab ?Phone: [phone] ?Office Phone: [phone] ? ?

## 2021-11-30 NOTE — Evaluation (Signed)
Occupational Therapy Evaluation ?Patient Details ?Name: John [name] ?MRN: [id number] ?DOB: [birth year] ?Today's Date: 11/30/2021 ? ? ?History of Present Illness Pt is a plesant 71 y/o left HD male with PMH that includes A-fib, DM, CAD, AKI, on CKD stage 3, aortic valve disorder, transminitis, gout. He was admitted w/ dx: CHF and is s/p Cath.  ? ?Clinical Impression ?  ? Pt admitted as above presenting with deficits as listed below (please see OT problem list). Pt lives with his wife whom can assist PRN at d/c. He is currently Min guard assist for functional mobility/transfers secondary to decreased endurance/activity tolerance and gouty flare left knee. Pt reports that his knee is much improved since yesterday. Pt was educated in role of OT and educated briefly in energy conservation techniques/balancing activity/rest PRN. He should benefit from acute OT to assist in maximizing independence with ADL/self care and transfers prior to anticipated d/c home with family. ?   ? ?Recommendations for follow up therapy are one component of a multi-disciplinary discharge planning process, led by the attending physician.  Recommendations may be updated based on patient status, additional functional criteria and insurance authorization.  ? ?Follow Up Recommendations ? No OT follow up  ?  ?Assistance Recommended at Discharge Intermittent Supervision/Assistance  ?Patient can return home with the following A little help with bathing/dressing/bathroom;Assistance with cooking/housework ? ?  ?Functional Status Assessment ? Patient has had a recent decline in their functional status and demonstrates the ability to make significant improvements in function in a reasonable and predictable amount of time.  ?Equipment Recommendations ? Other (comment) (Continue to assess in functional context)  ?  ?   ?Precautions / Restrictions Precautions ?Precautions: Fall;Other (comment) ?Precaution Comments: s/p Cath; gout flare left knee  ? ?   ? ?Mobility Bed Mobility ?  ?  ?General bed mobility comments: Pt received up in room following mobility ?  ? ?Transfers ?Overall transfer level: Needs assistance ?Equipment used: None ?Transfers: Sit to/from Stand, Bed to chair/wheelchair/BSC ?Sit to Stand: Supervision ?Stand pivot transfers: Supervision, Min guard ?  ?  ?General transfer comment: Min guard for safety w/o hands on assist ?  ? ?  ?Balance Overall balance assessment: Mild deficits observed, not formally tested ?  ?  ?   ? ?ADL either performed or assessed with clinical judgement  ? ?ADL Overall ADL's : Needs assistance/impaired ?Eating/Feeding: Modified independent;Sitting;Bed level ?  ?Grooming: Supervision/safety;Standing ?  ?Upper Body Bathing: Modified independent;Sitting ?  ?Lower Body Bathing: Sit to/from stand;Sitting/lateral leans;Modified independent;Supervison/ safety ?  ?Upper Body Dressing : Sitting;Supervision/safety ?Upper Body Dressing Details (indicate cue type and reason): Secondary to IV/lines and s/p Cath ?Lower Body Dressing: Supervision/safety;Modified independent;Sit to/from stand;Sitting/lateral leans ?  ?Toilet Transfer: Supervision/safety;Min guard;Ambulation;Regular Toilet ?  ?Toileting- Clothing Manipulation and Hygiene: Sitting/lateral lean;Supervision/safety;Min guard;Sit to/from stand ?  ?Tub/ Shower Transfer: Tub transfer;Min guard;Minimal assistance;Ambulation ?Tub/Shower Transfer Details (indicate cue type and reason): Simulated: Pt has garden tub at home with increased height ?Functional mobility during ADLs: Min guard ?General ADL Comments: Pt is a pleasant left hand dominant male. He lives with his wife whom can assist PRN at d/c. He is currently Min guard assist for functional mobility/transfers secondary to decreased endurance/activity tolerance and gouty flare left knee. Pt reports that his knee is much improved since yesterday. Pt was educated in role of OT and educated briefly in energy conservation  techniques and balancing activity with rest PRN. He should benefit from acute OT to assist in maximizing independence with ADL/self care  and transfers prior to anticipated d/c home with family.  ? ? ? ?Vision Baseline Vision/History: 1 Wears glasses ?Patient Visual Report: No change from baseline ?Vision Assessment?: No apparent visual deficits ?Additional Comments: Pt reports sometimes blurred vision "when my suger is high"  ?   ?Perception Perception ?Perception: Not tested ?  ?Praxis Praxis ?Praxis: Not tested ?  ? ?Pertinent Vitals/Pain Pain Assessment ?Pain Assessment: No/denies pain  ? ? ? ?Hand Dominance Left ?  ?Extremity/Trunk Assessment Upper Extremity Assessment ?Upper Extremity Assessment: Overall WFL for tasks assessed ?  ?Lower Extremity Assessment ?Lower Extremity Assessment: Defer to PT evaluation ?  ?Cervical / Trunk Assessment ?Cervical / Trunk Assessment: Normal ?  ?Communication Communication ?Communication: No difficulties ?  ?Cognition Arousal/Alertness: Awake/alert ?Behavior During Therapy: [facility] for tasks assessed/performed ?Overall Cognitive Status: Within Functional Limits for tasks assessed ?  ?  ?General Comments: Pt is A & O x4 stating left knee/gouty flare is much improved. ?  ?  ?General Comments  Mild deficits likely due to left knee gouty flare that pt reports is improving ? ?  ?   ?   ? ? ?Home Living Family/patient expects to be discharged to:: Private residence ?Living Arrangements: Spouse/significant other ?Available Help at Discharge: Family;Available PRN/intermittently ?Type of Home: Mobile home ?Home Access: Stairs to enter ?Entrance Stairs-Number of Steps: 5 STE ?Entrance Stairs-Rails: Can reach both ?Home Layout: One level ?  ?  ?Bathroom Shower/Tub: Tub/shower unit ?  ?Bathroom Toilet: Handicapped height ?  ?  ?Home Equipment: Conservation officer, nature (2 wheels) ?  ?  ?  ? ?  ?Prior Functioning/Environment Prior Level of Function : Independent/Modified Independent ?  ?  ?Mobility  Comments: Pt reports that he was ambulating w/o AD prior to this hospitialization ?ADLs Comments: Pt reports independence, does some cooking and cleaning as well ?  ? ?  ?  ?OT Problem List: Decreased knowledge of use of DME or AE;Decreased activity tolerance;Pain ?  ?   ?OT Treatment/Interventions: Self-care/ADL training;Patient/family education;Energy conservation;Therapeutic activities;DME and/or AE instruction  ?  ?OT Goals(Current goals can be found in the care plan section) Acute Rehab OT Goals ?Patient Stated Goal: Get better and go home. ?OT Goal Formulation: With patient ?Time For Goal Achievement: 12/14/21 ?Potential to Achieve Goals: Good  ?OT Frequency: Min 2X/week ?  ? ?   ?AM-PAC OT "6 Clicks" Daily Activity     ?Outcome Measure Help from another person eating meals?: None ?Help from another person taking care of personal grooming?: A Little ?Help from another person toileting, which includes using toliet, bedpan, or urinal?: A Little ?Help from another person bathing (including washing, rinsing, drying)?: A Little ?Help from another person to put on and taking off regular upper body clothing?: None ?Help from another person to put on and taking off regular lower body clothing?: A Little ?6 Click Score: 20 ?  ?End of Session Equipment Utilized During Treatment:  (None, OT controlled IV/lines) ?Nurse Communication: Mobility status ? ?Activity Tolerance:   ?Patient left: in bed;with call bell/phone within reach;with nursing/sitter in room ? ?OT Visit Diagnosis: Unsteadiness on feet (R26.81);Muscle weakness (generalized) (M62.81);Pain ?Pain - Right/Left: Left ?Pain - part of body: Knee (Pt with recent gouty flare - denies pain during this assessment. Some stiffness noted)  ?              ?Time: 8099-8338 ?OT Time Calculation (min): 16 min ?Charges:  OT General Charges ?$OT Visit: 1 Visit ?OT Evaluation ?$OT Eval Low Complexity: 1 Low ?OT  Treatments ?$Self Care/Home Management : 8-22 mins ? ?Percell Miller  Beth Dixon, OTR/L ?11/30/2021, 12:15 PM ?

## 2021-11-30 NOTE — Evaluation (Signed)
Physical Therapy Evaluation ?Patient Details ?Name: John [name] ?MRN: [id number] ?DOB: 12/11/1950 ?Today's Date: 11/30/2021 ? ?History of Present Illness ? The pt is a 71 yo male presenting 3/10 with gout flare, SOB, and bilateral edema. Pt found to have worsening renal functions, elevated INR, and acute on chronic CHF. R/L heart cath on 3/20. PMH includes: arthritis, afib on Coumadin, CKD III, CAD s/p CABG 2002, CHF, GERD, HLD, HTN, Rheumatic heart disease, MVR, and DM II. ?  ?Clinical Impression ? Pt in bed upon arrival of PT, agreeable to evaluation at this time. Prior to admission the pt was independent with mobility and ADLs, living with his spouse in a home with 5 steps to enter. The pt now presents with limitations in functional mobility, power, LLE ROM and strength, and dynamic stability due to above dx, and will continue to benefit from skilled PT to address these deficits. The pt had a small posterior LOB with initial stand, needing minA and single UE support to recover. He then completed hallway ambulation and stairs with at least single UE support, but did have a few minor LOB with decreased UE support. The pt will continue to benefit from skilled PT acutely to improve dynamic stability with gait prior to anticipated return home.  ?   ?   ? ?Recommendations for follow up therapy are one component of a multi-disciplinary discharge planning process, led by the attending physician.  Recommendations may be updated based on patient status, additional functional criteria and insurance authorization. ? ?Follow Up Recommendations No PT follow up ? ?  ?Assistance Recommended at Discharge Intermittent Supervision/Assistance  ?Patient can return home with the following ? A little help with walking and/or transfers;Help with stairs or ramp for entrance ? ?  ?Equipment Recommendations None recommended by PT  ?Recommendations for Other Services ?    ?  ?Functional Status Assessment Patient has had a recent decline  in their functional status and demonstrates the ability to make significant improvements in function in a reasonable and predictable amount of time.  ? ?  ?Precautions / Restrictions Precautions ?Precautions: Fall;Other (comment) ?Precaution Comments: s/p Cath 3/20; gout flare left knee ?Restrictions ?Weight Bearing Restrictions: No  ? ?  ? ?Mobility ? Bed Mobility ?Overal bed mobility: Independent ?  ?  ?  ?  ?  ?  ?  ?  ? ?Transfers ?Overall transfer level: Needs assistance ?Equipment used: None ?Transfers: Sit to/from Stand ?Sit to Stand: Min guard, Min assist ?  ?  ?  ?  ?  ?General transfer comment: minG to power up but minA to steady once in standing after posterior LOB ?  ? ?Ambulation/Gait ?Ambulation/Gait assistance: Min assist, Min guard ?Gait Distance (Feet): 200 Feet ?Assistive device: None, IV Pole ?Gait Pattern/deviations: Step-through pattern, Decreased stride length, Staggering left, Staggering right ?Gait velocity: decreased ?Gait velocity interpretation: 1.31 - 2.62 ft/sec, indicative of limited community ambulator ?  ?General Gait Details: pt initially needing IV pole to steady, able to progress to short bouts of no UE support without LOB but needed minA initially to steady ? ?Stairs ?Stairs: Yes ?Stairs assistance: Min guard ?Stair Management: One rail Right, Step to pattern, Forwards ?Number of Stairs: 2 ?General stair comments: cues for RLE first due to gout pain in LLE. no overt LOB, does use BUE support ? ? ?  ? ?Balance Overall balance assessment: Needs assistance ?Sitting-balance support: No upper extremity supported, Feet supported ?Sitting balance-Leahy Scale: Good ?  ?  ?Standing balance support: No  upper extremity supported, Single extremity supported ?Standing balance-Leahy Scale: Fair ?Standing balance comment: single to no UE support, minA to steady at times with no UE support. ?  ?  ?  ?  ?  ?  ?  ?  ?  ?  ?  ?   ? ? ? ?Pertinent Vitals/Pain Pain Assessment ?Pain Assessment:  No/denies pain  ? ? ?Home Living Family/patient expects to be discharged to:: Private residence ?Living Arrangements: Spouse/significant other ?Available Help at Discharge: Family;Available PRN/intermittently ?Type of Home: Mobile home ?Home Access: Stairs to enter ?Entrance Stairs-Rails: Can reach both ?Entrance Stairs-Number of Steps: 5 STE ?  ?Home Layout: One level ?Home Equipment: Conservation officer, nature (2 wheels) ?   ?  ?Prior Function Prior Level of Function : Independent/Modified Independent ?  ?  ?  ?  ?  ?  ?Mobility Comments: Pt reports that he was ambulating w/o AD prior to this hospitialization ?ADLs Comments: Pt reports independence, does some cooking and cleaning as well ?  ? ? ?Hand Dominance  ? Dominant Hand: Left ? ?  ?Extremity/Trunk Assessment  ? Upper Extremity Assessment ?Upper Extremity Assessment: Defer to OT evaluation ?  ? ?Lower Extremity Assessment ?Lower Extremity Assessment: Generalized weakness;LLE deficits/detail ?LLE Deficits / Details: pt with limited active ROM to L knee due to gout pain. ?LLE: Unable to fully assess due to pain ?LLE Sensation: WNL ?LLE Coordination: WNL ?  ? ?Cervical / Trunk Assessment ?Cervical / Trunk Assessment: Normal  ?Communication  ? Communication: No difficulties  ?Cognition Arousal/Alertness: Awake/alert ?Behavior During Therapy: [facility] for tasks assessed/performed ?Overall Cognitive Status: Within Functional Limits for tasks assessed ?  ?  ?  ?  ?  ?  ?  ?  ?  ?  ?  ?  ?  ?  ?  ?  ?General Comments: pt alert and able to follow all cues, slight cues for energy conservation ?  ?  ? ?  ?General Comments General comments (skin integrity, edema, etc.): VSS ? ?  ?   ? ?Assessment/Plan  ?  ?PT Assessment Patient needs continued PT services  ?PT Problem List Decreased strength;Decreased activity tolerance;Decreased balance;Decreased mobility;Pain ? ?   ?  ?PT Treatment Interventions DME instruction;Gait training;Stair training;Therapeutic activities;Functional mobility  training;Therapeutic exercise;Balance training;Patient/family education   ? ?PT Goals (Current goals can be found in the Care Plan section)  ?Acute Rehab PT Goals ?Patient Stated Goal: return home independent ?PT Goal Formulation: With patient ?Time For Goal Achievement: 12/14/21 ?Potential to Achieve Goals: Good ? ?  ?Frequency Min 3X/week ?  ? ? ?   ?AM-PAC PT "6 Clicks" Mobility  ?Outcome Measure Help needed turning from your back to your side while in a flat bed without using bedrails?: None ?Help needed moving from lying on your back to sitting on the side of a flat bed without using bedrails?: None ?Help needed moving to and from a bed to a chair (including a wheelchair)?: A Little ?Help needed standing up from a chair using your arms (e.g., wheelchair or bedside chair)?: A Little ?Help needed to walk in hospital room?: A Little ?Help needed climbing 3-5 steps with a railing? : A Little ?6 Click Score: 20 ? ?  ?End of Session Equipment Utilized During Treatment: Gait belt ?Activity Tolerance: Patient tolerated treatment well ?Patient left: in bed;with call bell/phone within reach;with family/visitor present ?Nurse Communication: Mobility status ?PT Visit Diagnosis: Unsteadiness on feet (R26.81);Other abnormalities of gait and mobility (R26.89) ?  ? ?Time: 0272-5366 ?  PT Time Calculation (min) (ACUTE ONLY): 24 min ? ? ?Charges:   PT Evaluation ?$PT Eval Low Complexity: 1 Low ?PT Treatments ?$Therapeutic Exercise: 8-22 mins ?  ?   ? ? ?West Carbo, PT, DPT  ? ?Acute Rehabilitation Department ?Pager #: [phone] - [phone] ? ?Sandra Cockayne ?11/30/2021, 3:51 PM ? ?

## 2021-11-30 NOTE — Progress Notes (Addendum)
Inpatient Diabetes Program Recommendations ? ?AACE/ADA: New Consensus Statement on Inpatient Glycemic Control (2015) ? ?Target Ranges:  Prepandial:   less than 140 mg/dL ?     Peak postprandial:   less than 180 mg/dL (1-2 hours) ?     Critically ill patients:  140 - 180 mg/dL  ? ?Lab Results  ?Component Value Date  ? GLUCAP 205 (H) 11/30/2021  ? HGBA1C 7.0 (H) 11/17/2021  ? ? ?Review of Glycemic Control ? Latest Reference Range & Units 11/29/21 09:24 11/29/21 12:39 11/29/21 16:30 11/29/21 21:24 11/30/21 06:00  ?Glucose-Capillary 70 - 99 mg/dL 224 (H) 236 (H) 327 (H) 310 (H) 205 (H)  ?(H): Data is abnormally high ? ? ?Current orders for Inpatient glycemic control: Semglee 14 units BID, Novolog 0-15 units TID and 0-5 units QHS, Noovlog 2 units TID with meals, Prednisone 40 mg QAM ? ?Inpatient Diabetes Program Recommendations:   ? ?-Novolog 4 units TID with meals  ?-Semglee 16 units BID ? ?Will continue to follow while inpatient. ? ?Thank you, ?Reche Dixon, MSN, RN ?Diabetes Coordinator ?Inpatient Diabetes Program ?[phone] (team pager from 8a-5p) ? ? ? ?

## 2021-12-01 LAB — COMPREHENSIVE METABOLIC PANEL
ALT: 19 U/L (ref 0–44)
AST: 24 U/L (ref 15–41)
Albumin: 2.9 g/dL — ABNORMAL LOW (ref 3.5–5.0)
Alkaline Phosphatase: 162 U/L — ABNORMAL HIGH (ref 38–126)
Anion gap: 10 (ref 5–15)
BUN: 120 mg/dL — ABNORMAL HIGH (ref 8–23)
CO2: 29 mmol/L (ref 22–32)
Calcium: 9 mg/dL (ref 8.9–10.3)
Chloride: 93 mmol/L — ABNORMAL LOW (ref 98–111)
Creatinine, Ser: 2.85 mg/dL — ABNORMAL HIGH (ref 0.61–1.24)
GFR, Estimated: 23 mL/min — ABNORMAL LOW (ref >60)
Glucose, Bld: 314 mg/dL — ABNORMAL HIGH (ref 70–99)
Potassium: 4.1 mmol/L (ref 3.5–5.1)
Sodium: 132 mmol/L — ABNORMAL LOW (ref 135–145)
Total Bilirubin: 1.2 mg/dL (ref 0.3–1.2)
Total Protein: 6.2 g/dL — ABNORMAL LOW (ref 6.5–8.1)

## 2021-12-01 LAB — GLUCOSE, CAPILLARY
Glucose-Capillary: 247 mg/dL — ABNORMAL HIGH (ref 70–99)
Glucose-Capillary: 180 mg/dL — ABNORMAL HIGH (ref 70–99)
Glucose-Capillary: 249 mg/dL — ABNORMAL HIGH (ref 70–99)
Glucose-Capillary: 269 mg/dL — ABNORMAL HIGH (ref 70–99)

## 2021-12-01 LAB — COOXEMETRY PANEL
Carboxyhemoglobin: 0.8 % (ref 0.5–1.5)
Carboxyhemoglobin: 0.9 % (ref 0.5–1.5)
Methemoglobin: <0.7 % (ref 0.0–1.5)
Methemoglobin: <0.7 % (ref 0.0–1.5)
O2 Saturation: 58.6 %
O2 Saturation: 63.6 %
Total hemoglobin: 10.4 g/dL — ABNORMAL LOW (ref 12.0–16.0)
Total hemoglobin: 10.4 g/dL — ABNORMAL LOW (ref 12.0–16.0)

## 2021-12-01 LAB — CBC
HCT: 31.1 % — ABNORMAL LOW (ref 39.0–52.0)
Hemoglobin: 10.1 g/dL — ABNORMAL LOW (ref 13.0–17.0)
MCH: 26.2 pg (ref 26.0–34.0)
MCHC: 32.5 g/dL (ref 30.0–36.0)
MCV: 80.6 fL (ref 80.0–100.0)
Platelets: 264 10*3/uL (ref 150–400)
RBC: 3.86 MIL/uL — ABNORMAL LOW (ref 4.22–5.81)
RDW: 20.9 % — ABNORMAL HIGH (ref 11.5–15.5)
WBC: 5.8 10*3/uL (ref 4.0–10.5)
nRBC: 0 % (ref 0.0–0.2)

## 2021-12-01 LAB — PROTIME-INR
INR: 2.6 — ABNORMAL HIGH (ref 0.8–1.2)
Prothrombin Time: 28.2 seconds — ABNORMAL HIGH (ref 11.4–15.2)

## 2021-12-01 LAB — MAGNESIUM: Magnesium: 2.2 mg/dL (ref 1.7–2.4)

## 2021-12-01 MED ORDER — PREDNISONE 20 MG PO TABS: 20.0000 mg | ORAL_TABLET | Freq: Every day | ORAL | Status: DC

## 2021-12-01 MED ORDER — PREDNISONE 20 MG PO TABS
30.0000 mg | ORAL_TABLET | Freq: Every day | ORAL | Status: DC
  Administered 2021-12-01 – 2021-12-02 (×2): 30 mg via ORAL
  Filled 2021-12-01 (×2): qty 1

## 2021-12-01 MED ORDER — WARFARIN SODIUM 5 MG PO TABS
5.0000 mg | ORAL_TABLET | Freq: Once | ORAL | Status: AC
  Administered 2021-12-01: 5 mg via ORAL
  Filled 2021-12-01: qty 1

## 2021-12-01 MED ORDER — TORSEMIDE 20 MG PO TABS
40.0000 mg | ORAL_TABLET | Freq: Two times a day (BID) | ORAL | Status: DC
Start: 2021-12-01 — End: 2021-12-02
  Administered 2021-12-01 (×2): 40 mg via ORAL
  Filled 2021-12-01 (×2): qty 2

## 2021-12-01 MED ORDER — PREDNISONE 10 MG PO TABS: 10.0000 mg | ORAL_TABLET | Freq: Every day | ORAL | Status: DC

## 2021-12-01 NOTE — Progress Notes (Signed)
Physical Therapy Treatment ?Patient Details ?Name: John [name] ?MRN: [id number] ?DOB: May 21, 1951 ?Today's Date: 12/01/2021 ? ? ?History of Present Illness The pt is a 71 yo male presenting 3/10 with gout flare, SOB, and bilateral edema. Pt found to have worsening renal functions, elevated INR, and acute on chronic CHF. R/L heart cath on 3/20. PMH includes: arthritis, afib on Coumadin, CKD III, CAD s/p CABG 2002, CHF, GERD, HLD, HTN, Rheumatic heart disease, MVR, and DM II. ? ?  ?PT Comments  ? ? The pt was agreeable to session this afternoon in preparation for d/c home. He continues to have moderate instability with gait when not using DME for UE support, and had x2 minor LOB needing minA to steady. The pt was encouraged to mobilize with family present for support and to use RW at home as needed for maximal safety. Pt with no additional questions or concerns regarding mobility at this time. Is safe for d/c when medically stable. ?   ?Recommendations for follow up therapy are one component of a multi-disciplinary discharge planning process, led by the attending physician.  Recommendations may be updated based on patient status, additional functional criteria and insurance authorization. ? ?Follow Up Recommendations ? No PT follow up ?  ?  ?Assistance Recommended at Discharge Intermittent Supervision/Assistance  ?Patient can return home with the following A little help with walking and/or transfers;Help with stairs or ramp for entrance ?  ?Equipment Recommendations ? None recommended by PT  ?  ?Recommendations for Other Services   ? ? ?  ?Precautions / Restrictions Precautions ?Precautions: Fall;Other (comment) ?Precaution Comments: s/p Cath 3/20; gout flare left knee ?Restrictions ?Weight Bearing Restrictions: No  ?  ? ?Mobility ? Bed Mobility ?Overal bed mobility: Independent ?  ?  ?  ?  ?  ?  ?  ?  ? ?Transfers ?Overall transfer level: Needs assistance ?Equipment used: None ?Transfers: Sit to/from Stand ?Sit to  Stand: Min guard ?  ?  ?  ?  ?  ?General transfer comment: minG to power up to standing, no LOB today. ?  ? ?Ambulation/Gait ?Ambulation/Gait assistance: Min assist, Min guard ?Gait Distance (Feet): 250 Feet ?Assistive device: None ?Gait Pattern/deviations: Step-through pattern, Decreased stride length, Staggering left, Staggering right ?Gait velocity: 0.36 m/s ?Gait velocity interpretation: <1.31 ft/sec, indicative of household ambulator ?  ?General Gait Details: pt able to ambulate slowly without use of IV pole. increased staggering steps at time needing minA. unable to tolerate challenge ? ? ?Stairs ?Stairs: Yes ?Stairs assistance: Min guard ?Stair Management: One rail Right, Step to pattern, Forwards ?Number of Stairs: 4 ?General stair comments: cues for RLE first due to gout pain in LLE. no overt LOB, does use BUE support ? ? ? ? ?  ?Balance Overall balance assessment: Needs assistance ?Sitting-balance support: No upper extremity supported, Feet supported ?Sitting balance-Leahy Scale: Good ?  ?  ?Standing balance support: No upper extremity supported, Single extremity supported ?Standing balance-Leahy Scale: Fair ?Standing balance comment: single to no UE support, minA to steady at times with no UE support. ?  ?  ?  ?  ?  ?  ?  ?  ?  ?  ?  ?  ? ?  ?Cognition Arousal/Alertness: Awake/alert ?Behavior During Therapy: [facility] for tasks assessed/performed ?Overall Cognitive Status: Within Functional Limits for tasks assessed ?  ?  ?  ?  ?  ?  ?  ?  ?  ?  ?  ?  ?  ?  ?  ?  ?  General Comments: pt alert and able to follow all cues, slight cues for energy conservation ?  ?  ? ?  ?Exercises   ? ?  ?General Comments General comments (skin integrity, edema, etc.): VSS on RA ?  ?  ? ?Pertinent Vitals/Pain Pain Assessment ?Pain Assessment: Faces ?Faces Pain Scale: Hurts a little bit ?Pain Location: R knee ?Pain Descriptors / Indicators: Discomfort ?Pain Intervention(s): Limited activity within patient's tolerance, Monitored  during session, Repositioned  ? ? ? ?PT Goals (current goals can now be found in the care plan section) Acute Rehab PT Goals ?Patient Stated Goal: return home independent ?PT Goal Formulation: With patient ?Time For Goal Achievement: 12/14/21 ?Potential to Achieve Goals: Good ?Progress towards PT goals: Progressing toward goals ? ?  ?Frequency ? ? ? Min 3X/week ? ? ? ?  ?PT Plan Current plan remains appropriate  ? ? ?   ?AM-PAC PT "6 Clicks" Mobility   ?Outcome Measure ? Help needed turning from your back to your side while in a flat bed without using bedrails?: None ?Help needed moving from lying on your back to sitting on the side of a flat bed without using bedrails?: None ?Help needed moving to and from a bed to a chair (including a wheelchair)?: A Little ?Help needed standing up from a chair using your arms (e.g., wheelchair or bedside chair)?: A Little ?Help needed to walk in hospital room?: A Little ?Help needed climbing 3-5 steps with a railing? : A Little ?6 Click Score: 20 ? ?  ?End of Session Equipment Utilized During Treatment: Gait belt ?Activity Tolerance: Patient tolerated treatment well ?Patient left: in bed;with call bell/phone within reach;with family/visitor present ?Nurse Communication: Mobility status ?PT Visit Diagnosis: Unsteadiness on feet (R26.81);Other abnormalities of gait and mobility (R26.89) ?  ? ? ?Time: 1610-9604 ?PT Time Calculation (min) (ACUTE ONLY): 16 min ? ?Charges:  $Therapeutic Exercise: 8-22 mins          ?          ? ?West Carbo, PT, DPT  ? ?Acute Rehabilitation Department ?Pager #: [phone] - [phone] ? ? ?Sandra Cockayne ?12/01/2021, 3:01 PM ? ?

## 2021-12-01 NOTE — Progress Notes (Addendum)
Patient ID: John [name], male   DOB: May 02, 1951, 71 y.o.   MRN: [id number] ? ? ? ?Advanced Heart Failure Rounding Note ? ? ?Subjective:   ? ?Milrinone restarted 03/20 to facilitate diuresis.  ?3/23 Milrinone increased 0.25 mcg. Placed on lasix drip and given metolazone.  ? ?On milrinone 0.25 mcg. CO-OX 59%.  ? ?Brisk diuresis noted. Weight down another 5 pounds.Overall weight down 33 pounds. ? ?Denies SOB.Able to walk in the hall. Knee pain feeling better. ? ?RHC Procedural Findings: ?Hemodynamics (mmHg) ?RA mean 13 ?RV 67/14 ?PA 65/33, mean 46 ?PCWP mean 22 ?LV 117/25 ?AO 105/52 ?Oxygen saturations: ?PA 59% ?AO 97% ?Cardiac Output (Fick) 4.87  ?Cardiac Index (Fick) 2.3 ?PVR 4.9 WU ?Aortic valve mean gradient 13.6 mmHg, AVA 1.61 cm^2 ? ?Objective:   ? ?Vital Signs:   ?Temp:  [97.6 ?F (36.4 ?C)-98 ?F (36.7 ?C)] 98 ?F (36.7 ?C) (03/24 0435) ?Pulse Rate:  [89-99] 92 (03/24 0435) ?Resp:  [16-18] 18 (03/23 2332) ?BP: (101-124)/(57-89) 102/65 (03/24 0435) ?SpO2:  [95 %-99 %] 99 % (03/24 0435) ?Weight:  [88.3 kg] 88.3 kg (03/24 0435) ?Last BM Date : 11/28/21 ? ?Weight change: ?Filed Weights  ? 11/29/21 0431 11/30/21 0442 12/01/21 0435  ?Weight: 90.8 kg 90.4 kg 88.3 kg  ? ? ?Intake/Output:  ? ?Intake/Output Summary (Last 24 hours) at 12/01/2021 0749 ?Last data filed at 12/01/2021 0608 ?Gross per 24 hour  ?Intake 725.79 ml  ?Output 3900 ml  ?Net -3174.21 ml  ? ?CVP 6-7   ?PHYSICAL EXAM: ?General:  Well appearing. No resp difficulty ?HEENT: normal ?Neck: supple. no JVD. Carotids 2+ bilat; no bruits. No lymphadenopathy or thryomegaly appreciated. ?Cor: PMI nondisplaced. Mechanical S1 Irregular rate & rhythm. No rubs, gallops or murmurs. ?Lungs: clear ?Abdomen: soft, nontender, nondistended. No hepatosplenomegaly. No bruits or masses. Good bowel sounds. ?Extremities: no cyanosis, clubbing, rash, edema. Marland Kitchen LUE PICC ?Neuro: alert & orientedx3, cranial nerves grossly intact. moves all 4 extremities w/o difficulty. Affect  pleasant ? ? ?Telemetry:  A fib 80-90s with occasional PVCs. ? ?Labs: ?Basic Metabolic Panel: ?Recent Labs  ?Lab 11/27/21 ?0300 11/27/21 ?1440 11/28/21 ?7591 11/29/21 ?6384 11/30/21 ?0410 12/01/21 ?0450  ?NA 135 139  140 137 132* 136 132*  ?K 3.9 3.4*  3.3* 3.7 4.4 4.3 4.1  ?CL 98  --  99 95* 97* 93*  ?CO2 25  --  '27 27 28 29  '$ ?GLUCOSE 87  --  137* 317* 218* 314*  ?BUN 105*  --  102* 100* 107* 120*  ?CREATININE 3.06*  --  2.68* 2.69* 2.88* 2.85*  ?CALCIUM 8.7*  --  8.7* 8.6* 8.7* 9.0  ?MG 2.3  --  2.2 2.0 2.1 2.2  ? ? ?Liver Function Tests: ?Recent Labs  ?Lab 11/30/21 ?0410 12/01/21 ?6659  ?AST 24 24  ?ALT 17 19  ?ALKPHOS 164* 162*  ?BILITOT 1.1 1.2  ?PROT 6.0* 6.2*  ?ALBUMIN 2.8* 2.9*  ? ? ?No results for input(s): LIPASE, AMYLASE in the last 168 hours. ?No results for input(s): AMMONIA in the last 168 hours. ? ?CBC: ?Recent Labs  ?Lab 11/27/21 ?0300 11/27/21 ?1440 11/28/21 ?9357 11/29/21 ?0177 11/30/21 ?0410 12/01/21 ?0450  ?WBC 4.9  --  5.1 6.0 6.5 5.8  ?HGB 11.2* 11.6*  11.6* 10.6* 9.9* 9.9* 10.1*  ?HCT 35.8* 34.0*  34.0* 32.9* 31.9* 31.0* 31.1*  ?MCV 82.1  --  80.2 82.2 81.6 80.6  ?PLT 221  --  238 209 250 264  ? ? ?Cardiac Enzymes: ?No results for input(s): CKTOTAL,  CKMB, CKMBINDEX, TROPONINI in the last 168 hours. ? ?BNP: ?BNP (last 3 results) ?Recent Labs  ?  11/17/21 ?1155  ?BNP >4,500.0*  ? ? ?ProBNP (last 3 results) ?No results for input(s): PROBNP in the last 8760 hours. ? ? ? ?Other results: ? ?Imaging: ?No results found. ? ? ?Medications:   ? ? ?Scheduled Medications: ? Chlorhexidine Gluconate Cloth  6 each Topical Daily  ? clopidogrel  75 mg Oral Q breakfast  ? insulin aspart  0-15 Units Subcutaneous TID WC  ? insulin aspart  0-5 Units Subcutaneous QHS  ? insulin aspart  4 Units Subcutaneous TID WC  ? insulin glargine-yfgn  16 Units Subcutaneous BID  ? metoprolol succinate  12.5 mg Oral Daily  ? pantoprazole  40 mg Oral Daily  ? predniSONE  40 mg Oral Daily  ? rosuvastatin  20 mg Oral Daily  ?  sodium chloride flush  10-40 mL Intracatheter Q12H  ? sodium chloride flush  3 mL Intravenous Q12H  ? sodium chloride flush  3 mL Intravenous Q12H  ? sodium chloride flush  3 mL Intravenous Q12H  ? Warfarin - Pharmacist Dosing Inpatient   Does not apply [id number]  ? ? ?Infusions: ? sodium chloride    ? sodium chloride    ? furosemide (LASIX) 200 mg in dextrose 5% 100 mL ('2mg'$ /mL) infusion 12 mg/hr (11/30/21 2246)  ? milrinone 0.25 mcg/kg/min (12/01/21 6606)  ? ? ?PRN Medications: ?sodium chloride, sodium chloride, acetaminophen, alum & mag hydroxide-simeth, calcium carbonate, diphenhydrAMINE, ondansetron (ZOFRAN) IV, oxyCODONE, sodium chloride flush, sodium chloride flush, sodium chloride flush, traMADol ? ? ?Assessment/Plan:  ? ?1. Acute on chronic systolic CHF: Last echo in 4/21 with EF 25-30%, severe LV dilation, normal RV, mild AS and AI, normal mechanical MV.  St Jude ICD. Suspect ischemic cardiomyopathy.  He is markedly volume overloaded on exam.  AKI at admission, baseline creatinine around 2.1.  Echo this admission with EF 20-25%, RV severely HK, mechanical MV with mean gradient 6, mod-severe TR, moderate AI, ?low flow/low gradient moderate-severe AS with mean gradient 15/AVA 0.96 cm^2.  TEE 3/14 showed EF around 25% but only mildly decreased RV function and trivial TR, normally functioning mechanical MV and abnormal aortic valve with moderate-severe AS and AI. He has diuresed well on milrinone 0.25 + Lasix/metolazone. Milrinone weaned off 3/16.  However, after [place] on 3/20 it was restarted to facilitate further diuresis.  He is currently on milrinone 0.125 with co-ox 65%.  Yesterday milrinone increased to 0.25 mcg -->CO-OX 59%.. Cut back milrinone to 0.125 mcg. ?- CVP 6-7 today. Stop lasix drip.Start torsemide 40  mg twice a day.  ?- Continue low-dose Toprol XL for rate control ?- Other GDMT limited by AKI on CKD stage 3.  ?- Not candidate at this time for LVAD given renal dysfunction.  ?2. CAD: s/p CABG with MVR  in 2002.  Last cath 10/21 with occluded SVG-RCA, and 80% mRCA, totally occluded LAD, patent LIMA-LAD, 85% ostial LCx treated with DES.  No chest pain, doubt ACS.  ?- Continue Plavix (?transition ASA in future as >1 year post-PCI).  ?- Continue Crestor.  ?3. Atrial fibrillation: Permanent.  ?-Rate controlled. ?- Continue Toprol XL 12.5 mg daily.  ?-  INR 2.6. On warfarin. ?4. 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  INR goal 2.5-3.5.  ?- Warfarin has been restarted, INR 2.6.  ?5. AKI on CKD stage 3: Creatinine up to 3.46 at  admission, baseline around 2.1.  Suspect cardiorenal.  Creatinine now 3.06 > 2.68 > 2.69 > 2.88>2.85  ?- Follow with diuresis. ?6. Aortic valve disorder: Abnormal aortic valve, looks functionally bicuspid with fixed noncoronary and right coronary cusps.  Echo with ?moderate-severe AS with AVA 1.2 cm^2 but DI 0.23.  There was moderate-severe AI as well.  Hemodynamic right/left heart cath suggested only mild aortic stenosis. Not sure  he is a TAVR candidate at this time.  ?7. Transaminitis. Mild, likely due to passive congestion  ?- diurese ?8. Left knee pain: Acute gout flare. On prednisone. Uric acid 9.9.  ?- Ortho consulted. Offered aspiration and injection. Patient declined.  ?- Start prednisone tape.  ?- Can have tramadol for mild breakthrough pain, oxycodone for severe.  ?9. IDA: ?- Hgb 10.1 Stable. ?- Iron stores low. Given feraheme ? ?Darrick Grinder NP-C  ?12/01/2021 ?7:49 AM ? ?Patient seen with NP, agree with the above note.  ? ?He diuresed very well yesterday, weight down 5 lbs.  Co-ox 64% on milrinone 0.25.  Creatinine 2.85 (stable) but BUN higher.  CVP 6-7.   ? ?Breathing improved, knee improved.  Walked twice yesterday.  ? ?General: NAD ?Neck: No JVD, no thyromegaly or thyroid nodule.  ?Lungs: Clear to auscultation bilaterally with normal respiratory effort. ?CV: Nondisplaced PMI.  Heart irregular S1/S2, no S3/S4, no  murmur.  1+ ankle edema.  ?Abdomen: Soft, nontender, no hepatosplenomegaly, no distention.  ?Skin: Intact without lesions or rashes.  ?Neurologic: Alert and oriented x 3.  ?Psych: Normal affect. ?Extremities: No cl

## 2021-12-01 NOTE — Progress Notes (Signed)
Inpatient Diabetes Program Recommendations ? ?AACE/ADA: New Consensus Statement on Inpatient Glycemic Control  ? ?Target Ranges:  Prepandial:   less than 140 mg/dL ?     Peak postprandial:   less than 180 mg/dL (1-2 hours) ?     Critically ill patients:  140 - 180 mg/dL  ? ? Latest Reference Range & Units 11/30/21 06:00 11/30/21 11:18 11/30/21 16:40 11/30/21 21:04 12/01/21 06:08  ?Glucose-Capillary 70 - 99 mg/dL 205 (H) 192 (H) 204 (H) 303 (H) 247 (H)  ? ?Review of Glycemic Control ? ?Diabetes history: DM2 ?Outpatient Diabetes medications: Lantus 70 units QHS (if glucose is over 150 mg/dl), Glipizide 10 mg daily, Januvia 50 mg daily, Metformin 500 mg QAM ?Current orders for Inpatient glycemic control: Semglee 16 units BID, Novolog 4 units TID with meals, Novolog 0-15 units TID with meals, Novolog 0-5 units QHS; Prednisone 40 mg daily ? ?Inpatient Diabetes Program Recommendations:   ? ?Insulin: If steroids are continued, please consider increasing Semglee to 20 units BID and meal coverage to Novolog 6 units TID with meals for meal coverage. ? ?Thanks, ?Barnie Alderman, RN, MSN, CDE ?Diabetes Coordinator ?Inpatient Diabetes Program ?[phone] (Team Pager from 8am to 5pm) ? ? ? ?

## 2021-12-01 NOTE — Progress Notes (Signed)
ANTICOAGULATION CONSULT NOTE - Follow Up Consult ? ?Pharmacy Consult for warfarin + heparin ?Indication:  mechanical MVR ? ?Allergies  ?Allergen Reactions  ? Allopurinol Shortness Of Breath  ? Doxycycline Hives  ? Wilder Glade [Dapagliflozin]   ?  Can not tolerate - dizzy weak almost passed out  ? ? ?Patient Measurements: ?Height: '5\' 11"'$  (180.3 cm) ?Weight: 88.3 kg (194 lb 9.6 oz) (scale c) ?IBW/kg (Calculated) : 75.3 ?Heparin Dosing Weight: 92.9 kg ? ?Vital Signs: ?Temp: 97.7 ?F (36.5 ?C) (03/24 6967) ?Temp Source: Oral (03/24 8938) ?BP: 110/62 (03/24 1017) ?Pulse Rate: 94 (03/24 0828) ? ?Labs: ?Recent Labs  ?  11/28/21 ?2124 11/29/21 ?5102 11/29/21 ?5852 11/30/21 ?0410 12/01/21 ?0450  ?HGB  --  9.9*   < > 9.9* 10.1*  ?HCT  --  31.9*  --  31.0* 31.1*  ?PLT  --  209  --  250 264  ?LABPROT  --  24.4*  --  27.6* 28.2*  ?INR  --  2.2*  --  2.6* 2.6*  ?HEPARINUNFRC 0.25* 0.39  --  0.52  --   ?CREATININE  --  2.69*  --  2.88* 2.85*  ? < > = values in this interval not displayed.  ? ? ? ?Estimated Creatinine Clearance: 26.4 mL/min (A) (by C-G formula based on SCr of 2.85 mg/dL (H)). ? ? ?Medical History: ?Past Medical History:  ?Diagnosis Date  ? Abnormal nuclear stress test 05/10/2017  ? Allergy   ? Anemia   ? "lost blood w/the cancer"  ? Arthritis   ? back   ? Atrial fibrillation ([place])   ? takes Coumadin daily  ? Bruises easily   ? takes Coumadin daily  ? CAD (coronary artery disease)   ? a. s/p CABG 11/2000 (L-LAD, S-RCA);  b. Lexiscan Myoview 11/12: EF 45%, ischemia and possibly some scar at the base of the inferolateral wall;   c. Lex MV 11/13:  EF 44%, Inf and IL scar/soft tissue atten, small mild amt of inf ischemia,  ? CHF (congestive heart failure) ([place])   ? Constipation   ? takes Colace daily  ? 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 Enlarged prostate   ? Flu 09/2013  ? GERD (gastroesophageal reflux disease)   ? takes Nexium daily  ? Glaucoma   ? History of blood transfusion   ? "related to cancer"  ? History of  colon polyps   ? HLD (hyperlipidemia)   ? takes Vytorin daily  ? HTN (hypertension)   ? takes Amlodipine,Metoprolol, and Ramipril daily  ? Insomnia   ? states he has always been like this.But doesn't take any meds  ? Kidney stones   ? "I passed them all"  ? Nocturia   ? Peripheral edema   ? takes Furosemide daily  ? Peripheral neuropathy   ? Rheumatic fever ~ 1965  ? Rheumatic heart disease   ? a. s/p mechanical (St. Jude) MVR 2002;  b. Echo 5/11: Mild LVH, EF 45-50%, mild AS, mild AI, mean gradient 11 mmHg, MVR with normal gradients, moderate LAE, mild RAE;  c.  Echo 11/13:   mod LVH, EF 55-60%, mild AS (mean 18 mmHg), mild AI, severe LAE, mild RAE, PASP 41  ? 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 SOB (shortness of breath)   ? with exertion  ? Type II diabetes mellitus ([place])   ? takes Januvia,Glipizide,and Metformin daily as well as Lantus  ? Unstable angina ([place]) 06/21/2020  ? ? ?  Medications:  ?Infusions:  ? sodium chloride    ? sodium chloride    ? milrinone 0.25 mcg/kg/min (12/01/21 7989)  ? ? ?Assessment: ?71 yo M on warfarin PTA for mechanical MVR (INR goal 2.5-3.5). Warfarin was held  in preparation for RHC/LHC for TAVR work-up. He was bridged with heparin and now pharmacy has been consulted for warfarin dosing. Warfarin resumed 3/20.  ? ?INR remains 2.6 today. Prednisone taper started today, amy need to adjust warfarin regimen as prednisone dose decreased. CBC stable. No s/sx of bleeding.  ? ?PTA warfarin dose: 5 mg/day for 5 days then 2.5 x2 days then repeat ? ?Goal of Therapy:  ?Heparin level 0.3-0.7 units/ml ?Monitor platelets by anticoagulation protocol: Yes ?  ?Plan:  ?warfarin 5 mg tonight  ?CBC and INR daily while on warfarin  ? ?Cathrine Muster, PharmD ?PGY2 Cardiology Pharmacy Resident ?Phone: [phone] ?12/01/2021  10:09 AM ? ?Please check [email or web address] for unit-specific pharmacy phone numbers. ? ? ? ? ? ? ? ?

## 2021-12-01 NOTE — Progress Notes (Signed)
Patient potentially discharging over weekend. Offered to send meds to [place] this afternoon.  ? ?Patient prefers to receive all medications through local [facility] at discharge. ?

## 2021-12-02 ENCOUNTER — Other Ambulatory Visit: Payer: Self-pay | Admitting: Medical

## 2021-12-02 DIAGNOSIS — N183 Chronic kidney disease, stage 3 unspecified: Secondary | ICD-10-CM

## 2021-12-02 DIAGNOSIS — D509 Iron deficiency anemia, unspecified: Secondary | ICD-10-CM

## 2021-12-02 DIAGNOSIS — N179 Acute kidney failure, unspecified: Secondary | ICD-10-CM

## 2021-12-02 DIAGNOSIS — I130 Hypertensive heart and chronic kidney disease with heart failure and stage 1 through stage 4 chronic kidney disease, or unspecified chronic kidney disease: Principal | ICD-10-CM

## 2021-12-02 DIAGNOSIS — M109 Gout, unspecified: Secondary | ICD-10-CM

## 2021-12-02 LAB — COMPREHENSIVE METABOLIC PANEL
ALT: 19 U/L (ref 0–44)
AST: 21 U/L (ref 15–41)
Albumin: 3.2 g/dL — ABNORMAL LOW (ref 3.5–5.0)
Alkaline Phosphatase: 168 U/L — ABNORMAL HIGH (ref 38–126)
Anion gap: 12 (ref 5–15)
BUN: 125 mg/dL — ABNORMAL HIGH (ref 8–23)
CO2: 32 mmol/L (ref 22–32)
Calcium: 9.2 mg/dL (ref 8.9–10.3)
Chloride: 91 mmol/L — ABNORMAL LOW (ref 98–111)
Creatinine, Ser: 2.6 mg/dL — ABNORMAL HIGH (ref 0.61–1.24)
GFR, Estimated: 26 mL/min — ABNORMAL LOW (ref >60)
Glucose, Bld: 203 mg/dL — ABNORMAL HIGH (ref 70–99)
Potassium: 3.8 mmol/L (ref 3.5–5.1)
Sodium: 135 mmol/L (ref 135–145)
Total Bilirubin: 1.4 mg/dL — ABNORMAL HIGH (ref 0.3–1.2)
Total Protein: 6.6 g/dL (ref 6.5–8.1)

## 2021-12-02 LAB — MAGNESIUM: Magnesium: 2.1 mg/dL (ref 1.7–2.4)

## 2021-12-02 LAB — CBC
HCT: 34.3 % — ABNORMAL LOW (ref 39.0–52.0)
Hemoglobin: 10.9 g/dL — ABNORMAL LOW (ref 13.0–17.0)
MCH: 25.5 pg — ABNORMAL LOW (ref 26.0–34.0)
MCHC: 31.8 g/dL (ref 30.0–36.0)
MCV: 80.3 fL (ref 80.0–100.0)
Platelets: 311 10*3/uL (ref 150–400)
RBC: 4.27 MIL/uL (ref 4.22–5.81)
RDW: 20.8 % — ABNORMAL HIGH (ref 11.5–15.5)
WBC: 6.5 10*3/uL (ref 4.0–10.5)
nRBC: 0 % (ref 0.0–0.2)

## 2021-12-02 LAB — GLUCOSE, CAPILLARY
Glucose-Capillary: 177 mg/dL — ABNORMAL HIGH (ref 70–99)
Glucose-Capillary: 221 mg/dL — ABNORMAL HIGH (ref 70–99)

## 2021-12-02 LAB — PROTIME-INR
INR: 2.7 — ABNORMAL HIGH (ref 0.8–1.2)
Prothrombin Time: 28.7 seconds — ABNORMAL HIGH (ref 11.4–15.2)

## 2021-12-02 LAB — COOXEMETRY PANEL
Carboxyhemoglobin: 1.2 % (ref 0.5–1.5)
Methemoglobin: <0.7 % (ref 0.0–1.5)
O2 Saturation: 59.3 %
Total hemoglobin: 11.4 g/dL — ABNORMAL LOW (ref 12.0–16.0)

## 2021-12-02 MED ORDER — POTASSIUM CHLORIDE CRYS ER 20 MEQ PO TBCR
40.0000 meq | EXTENDED_RELEASE_TABLET | Freq: Once | ORAL | Status: AC
  Administered 2021-12-02: 40 meq via ORAL
  Filled 2021-12-02: qty 2

## 2021-12-02 MED ORDER — TORSEMIDE 20 MG PO TABS: ORAL_TABLET | ORAL | 3 refills | Status: DC

## 2021-12-02 MED ORDER — METOPROLOL SUCCINATE ER 25 MG PO TB24
12.5000 mg | ORAL_TABLET | Freq: Every day | ORAL | 3 refills | Status: DC
Start: 2021-12-03 — End: 2022-01-15

## 2021-12-02 MED ORDER — TORSEMIDE 20 MG PO TABS
40.0000 mg | ORAL_TABLET | Freq: Every morning | ORAL | Status: DC
  Administered 2021-12-02: 40 mg via ORAL
  Filled 2021-12-02: qty 2

## 2021-12-02 MED ORDER — PREDNISONE 10 MG PO TABS: ORAL_TABLET | ORAL | 0 refills | Status: DC

## 2021-12-02 MED ORDER — JANUVIA 50 MG PO TABS: 25.0000 mg | ORAL_TABLET | Freq: Every day | ORAL | 2 refills | Status: DC

## 2021-12-02 MED ORDER — TORSEMIDE 20 MG PO TABS: 20.0000 mg | ORAL_TABLET | Freq: Every evening | ORAL | Status: DC

## 2021-12-02 NOTE — Discharge Instructions (Signed)
Heart Failure Education: Weigh yourself EVERY morning after you go to the bathroom but before you eat or drink anything. Write this number down in a weight log/diary. If you gain 3 pounds overnight or 5 pounds in a week, call the office. Take your medicines as prescribed. If you have concerns about your medications, please call us before you stop taking them.  Eat low salt foods--Limit salt (sodium) to 2000 mg per day. This will help prevent your body from holding onto fluid. Read food labels as many processed foods have a lot of sodium, especially canned goods and prepackaged meats. If you would like some assistance choosing low sodium foods, we would be happy to set you up with a nutritionist. Stay as active as you can everyday. Staying active will give you more energy and make your muscles stronger. Start with 5 minutes at a time and work your way up to 30 minutes a day. Break up your activities--do some in the morning and some in the afternoon. Start with 3 days per week and work your way up to 5 days as you can.  If you have chest pain, feel short of breath, dizzy, or lightheaded, STOP. If you don't feel better after a short rest, call 911. If you do feel better, call the office to let us know you have symptoms with exercise. Limit all fluids for the day to less than 2 liters. Fluid includes all drinks, coffee, juice, ice chips, soup, jello, and all other liquids.     

## 2021-12-02 NOTE — Discharge Summary (Signed)
?Discharge Summary  ?  ?Patient ID: John [name] ?MRN: [id number]; DOB: 1951-02-09 ? ?Admit date: 11/17/2021 ?Discharge date: 12/02/2021 ? ?PCP:  John Fraise, MD ?  ?[facility] Providers ?Cardiologist:  John Breeding, MD  ?Electrophysiologist:  John Peru, MD     ? ? ?Discharge Diagnoses  ?  ?Principal Problem: ?  Hypertensive heart and chronic kidney disease with heart failure and stage 1 through stage 4 chronic kidney disease, or chronic kidney disease ([place]) ?Active Problems: ?  HTN (hypertension) ?  Chronic atrial fibrillation (HCC) ?  MITRAL VALVE REPLACEMENT, HX OF ?  CAD (coronary artery disease) ?  Ischemic cardiomyopathy ?  CRI (chronic renal insufficiency), stage 3 (moderate) (HCC) ?  Acute on chronic systolic (congestive) heart failure ([place]) ?  AKI (acute kidney injury) ([place]) ?  Gout ?  Iron deficiency anemia ? ? ? ?Diagnostic Studies/Procedures  ?  ?R/LHC 11/27/21: ?1. Right and left heart filling pressures remain elevated.  ?2. 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  ?3. Preserved cardiac output.  ?4. Mild aortic stenosis by cath.  ?  ?I do not think this patient will be a good TAVR candidate.  With creatinine still elevated and filling pressures elevated, I will start back on low dose milrinone 0.125 and continue diuresis.  ?  ?TEE 11/21/21: ?Indication: Aortic valve disorder ?  ?Findings: Please see echo section for full report. Moderate LV dilation with EF 25%, diffuse hypokinesis. Mobile structure in the LV, suspect is the head of the posteromedial papillary muscle and not a thrombus.  Normal RV size with mildly decreased systolic function.  ICD in RV with trivial TR. Moderate LA enlargement, very small LA appendage (?partially clipped at surgery) with no thrombus noted.  Mild right atrial enlargement.  No PFO/ASD by color doppler. Mechanical mitral valve mean gradient 5 mmHg.  Valve opens well with minimal (expected) MR (normal function).  The aortic valve has  heavily calcified and fixed noncoronary and right coronary cusps, appears functionally bicuspid.  Mean gradient 18 mmHg with AVA 1.2 cm^2 with dimensionless index 0.23.  Moderate-severe low flow/low gradient aortic stenosis.  Moderate-severe aortic insufficiency with dimensionless index 300 msec and holodiastolic flow reversal in the ascending thoracic aorta.  Normal caliber thoracic aorta. IVC small, suggesting CVP <5.  ? ?Echo 11/17/21: ?1. Left ventricular ejection fraction, by estimation, is 20 to 25%. The  ?left ventricle has severely decreased function. The left ventricle  ?demonstrates global hypokinesis. The left ventricular internal cavity size  ?was moderately dilated. Left  ?ventricular diastolic parameters are indeterminate. No LV thrombus.  ? 2. Right ventricular systolic function is severely reduced. The right  ?ventricular size is normal. There is severely elevated pulmonary artery  ?systolic pressure. The estimated right ventricular systolic pressure is  ?35.4 mmHg.  ? 3. Left atrial size was severely dilated.  ? 4. Right atrial size was mildly dilated.  ? 5. Moderate pleural effusion in the left lateral region.  ? 6. The mitral valve has been replaced by a mechanical bileafelt valve. No  ?evidence of mitral valve regurgitation, no PVL.  ? 7. Tricuspid valve regurgitation is moderate to severe. There is systolic  ?flow reversal of the hepatic vein.  ? 8. The aortic valve is calcified. Aortic valve regurgitation is moderate.  ?Moderate aortic valve stenosis. Aortic valve mean gradient measures 15.0  ?mmHg. There is a component of low flow low gradient aortic stenosis.  ? 9. The inferior vena cava is dilated in size with >50% respiratory  ?  variability, suggesting right atrial pressure of 8 mmHg.  ? ?Comparison(s): RV function is worse from prior.  ? ?_____________ ?  ?History of Present Illness   ?  ?John [name] is a 71 year old with a history of CAD, S/P CABG 2002, GERD, HLD, HTN,  Mechanical MVR  2002, rheumatic fever, DMII, chronic A fib, St Jude ICD, CKD Stage IIIb, and HFrEF. ?  ?On admission he reported having issues with gout flare, worsening renal function, and INR has been elevated. 2 weeks ago he developed leg edema and shortness of breath. Gradually worsening. +Orthopnea. Unable to sleep at night and has been sleeping in the chair. He does not weigh at home. Says his PCP had recommended cutting back diuretics but he continued to take lasix 40 mg daily.  ?  ?11/08/21 INR  7 ?11/08/21 Creatinine   2.53  ?  ?He was seen by  Dr Percival Spanish 11/17/21. Had marked volume overload and was sent for direct admit.  ?  ? ?Hospital Course  ?   ?Consultants: Ortho  ? ?  ?1. Acute on chronic systolic CHF:  Last echo in 4/21 with EF 25-30%, severe LV dilation, normal RV, mild AS and AI, normal mechanical MV.  St Jude ICD. Suspect ischemic cardiomyopathy.  He is markedly volume overloaded on exam.  AKI at admission, baseline creatinine around 2.1.  Echo this admission with EF 20-25%, RV severely HK, mechanical MV with mean gradient 6, mod-severe TR, moderate AI, ?low flow/low gradient moderate-severe AS with mean gradient 15/AVA 0.96 cm^2.  TEE 3/14 showed EF around 25% but only mildly decreased RV function and trivial TR, normally functioning mechanical MV and abnormal aortic valve with moderate-severe AS and AI. He has diuresed well on milrinone 0.25 + Lasix/metolazone. Milrinone weaned off 3/16.  However, after [place] on 3/20 it was restarted to facilitate further diuresis.  He was weaned off milrinone. He diuresed well with UOP net -22.4L this admission with weight 188lbs at discharge.  With RV dysfunction, not sure we can get much better than this. He was transitioned from IV lasix to torsemide. ?- Continue torsemide '40mg'$  qAM/'20mg'$  qPM  ?- Continue low-dose Toprol XL for rate control ?- Other GDMT limited by AKI on CKD stage 3.  ?- Not candidate at this time for LVAD given renal dysfunction.  ? ?2. CAD s/p CABG: with MVR in  2002.  Last cath 10/21 with occluded SVG-RCA, and 80% mRCA, totally occluded LAD, patent LIMA-LAD, 85% ostial LCx treated with DES.  No chest pain, doubt ACS.  ?- Continue Plavix  ?- Continue Crestor ?- Continue BBlocker ? ?3. Chronic atrial fibrillation: Rate controlled this admission. ?- Continue Toprol XL 12.5 mg daily.  ?- Continue warfarin. ? ?4. 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  INR goal 2.5-3.5 with INR 2.7 on the day of discharge.  ?- Continue coumadin - recommend INR check within next week ? ?5. AKI on CKD stage 3: Creatinine up to 3.46 at admission, baseline around 2.1.  Suspect cardiorenal, improved with diuresis. Cr 2.6 on the day of discharge ?- Continue close outpatient monitoring - check BMET at follow-up ? ?6. Aortic valve disorder: Abnormal aortic valve, looks functionally bicuspid with fixed noncoronary and right coronary cusps.  Echo with ?moderate-severe AS with AVA 1.2 cm^2 but DI 0.23.  There was moderate-severe AI as well.  Hemodynamic right/left heart cath suggested only mild aortic stenosis. Not sure  he  is a TAVR candidate at this time.  ?- Continue close outpatient monitoring ? ?7. Transaminitis: Found to have mild elevation of AST/ALT on admission, likely due to passive congestion. Resolved with diuresis. ? ?8. Left knee pain: Found to have an acute gout flare. Uric acid 9.9. Ortho consulted and offered aspiration and injection which patient declined. Started on prednisone. Uric acid 9.9.  ?- Continue prednisone taper ?- Can have tramadol for mild breakthrough pain, oxycodone for severe.  ? ?9. IDA: Iron levels low this admission. Given feraheme. Blood counts remained stable with Hgb 10.9 on the day of discharge ? ?10. DM type 2: A1C 7.0 this admission. Given AKI home metformin was held on discharge and home Januvia was decreased to '25mg'$  daily.  ?- Recommend outpatient PCP follow-up for close monitoring.   ? ?Did the patient have an acute coronary syndrome (MI, NSTEMI, STEMI, etc) this admission?:  No                               ?Did the patient have a percutaneous coronary intervention (stent / angiopla

## 2021-12-02 NOTE — Progress Notes (Signed)
Patient ID: John [name], male   DOB: Feb 01, 1951, 71 y.o.   MRN: [id number] ? ? ? ?Advanced Heart Failure Rounding Note ? ? ?Subjective:   ? ?Milrinone restarted 03/20 to facilitate diuresis.  ?3/23 Milrinone increased 0.25 mcg. Placed on lasix drip and given metolazone.  ? ?On milrinone, decreased to 0.125 mcg/kg/min yesterday. CO-OX 59%. CVP 10.  Good diuresis again, weight down 6 lbs.  BUN still very high but creatinine down to 2.6.  ? ?Feels good, walked in hall.  Knee no longer bothering him.  He really wants to go home today.  ? ?[facility] Procedural Findings: ?Hemodynamics (mmHg) ?RA mean 13 ?RV 67/14 ?PA 65/33, mean 46 ?PCWP mean 22 ?LV 117/25 ?AO 105/52 ?Oxygen saturations: ?PA 59% ?AO 97% ?Cardiac Output (Fick) 4.87  ?Cardiac Index (Fick) 2.3 ?PVR 4.9 WU ?Aortic valve mean gradient 13.6 mmHg, AVA 1.61 cm^2 ? ?Objective:   ? ?Vital Signs:   ?Temp:  [97.6 ?F (36.4 ?C)-98 ?F (36.7 ?C)] 97.6 ?F (36.4 ?C) (03/25 0428) ?Pulse Rate:  [88-98] 88 (03/25 0428) ?Resp:  [17-20] 17 (03/25 0428) ?BP: (103-118)/(62-72) 116/66 (03/25 0428) ?SpO2:  [98 %-99 %] 98 % (03/25 0428) ?Weight:  [85.5 kg] 85.5 kg (03/25 0428) ?Last BM Date : 11/28/21 ? ?Weight change: ?Filed Weights  ? 11/30/21 0442 12/01/21 0435 12/02/21 0428  ?Weight: 90.4 kg 88.3 kg 85.5 kg  ? ? ?Intake/Output:  ? ?Intake/Output Summary (Last 24 hours) at 12/02/2021 0744 ?Last data filed at 12/02/2021 4235 ?Gross per 24 hour  ?Intake 999.36 ml  ?Output 4450 ml  ?Net -3450.64 ml  ? ?CVP 10  ?PHYSICAL EXAM: ?General: NAD ?Neck: No JVD, no thyromegaly or thyroid nodule.  ?Lungs: Clear to auscultation bilaterally with normal respiratory effort. ?CV: Nondisplaced PMI.  Heart irregular S1/S2 with mechanical S1, no S3/S4, 2/6 SEM RUSB.  No peripheral edema.   ?Abdomen: Soft, nontender, no hepatosplenomegaly, no distention.  ?Skin: Intact without lesions or rashes.  ?Neurologic: Alert and oriented x 3.  ?Psych: Normal affect. ?Extremities: No clubbing or cyanosis.  ?HEENT:  Normal.  ? ?Telemetry:  A fib 80-90s with occasional PVCs (personally reviewed). ? ?Labs: ?Basic Metabolic Panel: ?Recent Labs  ?Lab 11/28/21 ?3614 11/29/21 ?4315 11/30/21 ?0410 12/01/21 ?0450 12/02/21 ?0530  ?NA 137 132* 136 132* 135  ?K 3.7 4.4 4.3 4.1 3.8  ?CL 99 95* 97* 93* 91*  ?CO2 '27 27 28 29 '$ 32  ?GLUCOSE 137* 317* 218* 314* 203*  ?BUN 102* 100* 107* 120* 125*  ?CREATININE 2.68* 2.69* 2.88* 2.85* 2.60*  ?CALCIUM 8.7* 8.6* 8.7* 9.0 9.2  ?MG 2.2 2.0 2.1 2.2 2.1  ? ? ?Liver Function Tests: ?Recent Labs  ?Lab 11/30/21 ?0410 12/01/21 ?0450 12/02/21 ?0530  ?AST '24 24 21  '$ ?ALT '17 19 19  '$ ?ALKPHOS 164* 162* 168*  ?BILITOT 1.1 1.2 1.4*  ?PROT 6.0* 6.2* 6.6  ?ALBUMIN 2.8* 2.9* 3.2*  ? ? ?No results for input(s): LIPASE, AMYLASE in the last 168 hours. ?No results for input(s): AMMONIA in the last 168 hours. ? ?CBC: ?Recent Labs  ?Lab 11/28/21 ?4008 11/29/21 ?6761 11/30/21 ?0410 12/01/21 ?0450 12/02/21 ?0530  ?WBC 5.1 6.0 6.5 5.8 6.5  ?HGB 10.6* 9.9* 9.9* 10.1* 10.9*  ?HCT 32.9* 31.9* 31.0* 31.1* 34.3*  ?MCV 80.2 82.2 81.6 80.6 80.3  ?PLT 238 209 250 264 311  ? ? ?Cardiac Enzymes: ?No results for input(s): CKTOTAL, CKMB, CKMBINDEX, TROPONINI in the last 168 hours. ? ?BNP: ?BNP (last 3 results) ?Recent Labs  ?  11/17/21 ?1155  ?  BNP >4,500.0*  ? ? ?ProBNP (last 3 results) ?No results for input(s): PROBNP in the last 8760 hours. ? ? ? ?Other results: ? ?Imaging: ?No results found. ? ? ?Medications:   ? ? ?Scheduled Medications: ? Chlorhexidine Gluconate Cloth  6 each Topical Daily  ? clopidogrel  75 mg Oral Q breakfast  ? insulin aspart  0-15 Units Subcutaneous TID WC  ? insulin aspart  0-5 Units Subcutaneous QHS  ? insulin aspart  4 Units Subcutaneous TID WC  ? insulin glargine-yfgn  16 Units Subcutaneous BID  ? metoprolol succinate  12.5 mg Oral Daily  ? pantoprazole  40 mg Oral Daily  ? potassium chloride  40 mEq Oral Once  ? predniSONE  30 mg Oral Daily  ? Followed by  ? [START ON 12/04/2021] predniSONE  20 mg Oral Daily   ? Followed by  ? [START ON 12/07/2021] predniSONE  10 mg Oral Daily  ? rosuvastatin  20 mg Oral Daily  ? sodium chloride flush  10-40 mL Intracatheter Q12H  ? sodium chloride flush  3 mL Intravenous Q12H  ? sodium chloride flush  3 mL Intravenous Q12H  ? sodium chloride flush  3 mL Intravenous Q12H  ? torsemide  40 mg Oral BID  ? Warfarin - Pharmacist Dosing Inpatient   Does not apply [id number]  ? ? ?Infusions: ? sodium chloride    ? sodium chloride    ? ? ?PRN Medications: ?sodium chloride, sodium chloride, acetaminophen, alum & mag hydroxide-simeth, calcium carbonate, diphenhydrAMINE, ondansetron (ZOFRAN) IV, oxyCODONE, sodium chloride flush, sodium chloride flush, sodium chloride flush, traMADol ? ? ?Assessment/Plan:  ? ?1. Acute on chronic systolic CHF: Last echo in 4/21 with EF 25-30%, severe LV dilation, normal RV, mild AS and AI, normal mechanical MV.  [employer] ICD. Suspect ischemic cardiomyopathy.  He is markedly volume overloaded on exam.  AKI at admission, baseline creatinine around 2.1.  Echo this admission with EF 20-25%, RV severely HK, mechanical MV with mean gradient 6, mod-severe TR, moderate AI, ?low flow/low gradient moderate-severe AS with mean gradient 15/AVA 0.96 cm^2.  TEE 3/14 showed EF around 25% but only mildly decreased RV function and trivial TR, normally functioning mechanical MV and abnormal aortic valve with moderate-severe AS and AI. He has diuresed well on milrinone 0.25 + Lasix/metolazone. Milrinone weaned off 3/16.  However, after [place] on 3/20 it was restarted to facilitate further diuresis.  He is currently on milrinone 0.125 with co-ox 59%. He diuresed well again yesterday, CVP now 10.  With RV dysfunction, not sure we can get much better than this. ?- Stop milrinone.  ?- Will plan on torsemide 40 qam/20 qpm for home.  ?- Continue low-dose Toprol XL for rate control ?- Other GDMT limited by AKI on CKD stage 3.  ?- Not candidate at this time for LVAD given renal dysfunction.  ?2. CAD:  s/p CABG with MVR in 2002.  Last cath 10/21 with occluded SVG-RCA, and 80% mRCA, totally occluded LAD, patent LIMA-LAD, 85% ostial LCx treated with DES.  No chest pain, doubt ACS.  ?- Continue Plavix (?transition ASA in future as >1 year post-PCI).  ?- Continue Crestor.  ?3. Atrial fibrillation: Permanent. Rate controlled. ?- Continue Toprol XL 12.5 mg daily.  ?- On warfarin. ?4. 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  INR goal 2.5-3.5.  ?- Warfarin has been restarted, INR 2.7.  ?5. AKI on CKD stage 3: Creatinine up  to 3.46 at admission, baseline around 2.1.  Suspect cardiorenal.  Creatinine now 3.06 > 2.68 > 2.69 > 2.88>2.85>2.6 with BUN still high.  ?- Close followup as outpatient.  ?6. Aortic valve disorder: Abnormal aortic valve, looks functionally bicuspid with fixed noncoronary and right coronary cusps.  Echo with ?moderate-severe AS with AVA 1.2 cm^2 but DI 0.23.  There was moderate-severe AI as well.  Hemodynamic right/left heart cath suggested only mild aortic stenosis. Do not think aortic valve is diseased enough to warrant TAVR at this time, reviewed with Dr. Burt Knack.   ?7. Transaminitis. Mild, likely due to passive congestion  ?8. Left knee pain: Acute gout flare. On prednisone. Uric acid 9.9.  ?- Ortho consulted. Offered aspiration and injection. Patient declined.  ?- Now with prednisone taper.   ?9. IDA: Has had Feraheme.  ?10. Disposition: I will let him go home today.  However, he will need close followup with BMET next week and CHF clinic followup in 7-10 days.  Needs coumadin clinic followup.  Cardiac meds for home: Plavix 75 daily, warfarin per coumadin clinic, torsemide 40 qam/20 qpm, Toprol XL 12.5 daily, Crestor 20 daily, KCl 20 daily, prednisone taper for gout as listed in current orders.  ? ?Loralie Champagne   ?12/02/2021 ?7:44 AM ? ?

## 2021-12-02 NOTE — Plan of Care (Signed)
?  Problem: Health Behavior/Discharge Planning: ?Goal: Ability to manage health-related needs will improve ?Outcome: Adequate for Discharge ?Discharged to go home today ?  ?

## 2021-12-02 NOTE — Progress Notes (Signed)
Nsg Discharge Note ? ?Admit Date:  11/17/2021 ?Discharge date: 12/02/2021 ?  ?John [name] to be D/C'd Home per MD order.  AVS completed.  Patient/caregiver able to verbalize understanding. ? ?Discharge Medication: ?Allergies as of 12/02/2021   ? ?   Reactions  ? Allopurinol Shortness Of Breath  ? Doxycycline Hives  ? Wilder Glade [dapagliflozin]   ? Can not tolerate - dizzy weak almost passed out  ? ?  ? ?  ?Medication List  ?  ? ?STOP taking these medications   ? ?colchicine 0.6 MG tablet ?  ?Entresto 24-26 MG ?Generic drug: sacubitril-valsartan ?  ?furosemide 40 MG tablet ?Commonly known as: LASIX ?  ?isosorbide mononitrate 30 MG 24 hr tablet ?Commonly known as: IMDUR ?  ?metFORMIN 500 MG tablet ?Commonly known as: GLUCOPHAGE ?  ? ?  ? ?TAKE these medications   ? ?clopidogrel 75 MG tablet ?Commonly known as: PLAVIX ?TAKE 1 TABLET BY MOUTH DAILY WITH BREAKFAST. ?  ?glipiZIDE 10 MG 24 hr tablet ?Commonly known as: GLUCOTROL XL ?Take 1 tablet (10 mg total) by mouth daily. ?  ?insulin glargine 100 UNIT/ML injection ?Commonly known as: LANTUS ?Inject 0.7 mLs (70 Units total) into the skin at bedtime. ?What changed:  ?when to take this ?additional instructions ?  ?Januvia 50 MG tablet ?Generic drug: sitaGLIPtin ?Take 0.5 tablets (25 mg total) by mouth daily. ?What changed: how much to take ?  ?metoprolol succinate 25 MG 24 hr tablet ?Commonly known as: TOPROL-XL ?Take 0.5 tablets (12.5 mg total) by mouth daily. ?Start taking on: December 03, 2021 ?What changed:  ?medication strength ?how much to take ?when to take this ?  ?nitroGLYCERIN 0.4 MG SL tablet ?Commonly known as: NITROSTAT ?Place 1 tablet (0.4 mg total) under the tongue every 5 (five) minutes as needed for chest pain. ?  ?OneTouch Delica Lancets 62V Misc ?TEST 2 TIMES DAILY AS DIRECTED ?  ?OneTouch Verio test strip ?Generic drug: glucose blood ?USE TO TEST TWICE A DAY ?  ?pantoprazole 40 MG tablet ?Commonly known as: PROTONIX ?Take 1 tablet (40 mg total) by mouth  daily. ?  ?predniSONE 10 MG tablet ?Commonly known as: DELTASONE ?Take 3 tablets (30 mg total) by mouth daily for 1 day, THEN 2 tablets (20 mg total) daily for 3 days, THEN 1 tablet (10 mg total) daily for 4 days. ?Start taking on: December 03, 2021 ?  ?rosuvastatin 20 MG tablet ?Commonly known as: CRESTOR ?TAKE 1 TABLET BY MOUTH EVERY DAY ?  ?sodium bicarbonate 650 MG tablet ?Take 650 mg by mouth 2 (two) times daily. ?  ?torsemide 20 MG tablet ?Commonly known as: DEMADEX ?Take 2 tablets ('40mg'$  total) by mouth every morning and 1 tablet ('20mg'$  total) by mouth every evening ?  ?warfarin 5 MG tablet ?Commonly known as: COUMADIN ?Take as directed. If you are unsure how to take this medication, talk to your nurse or doctor. ?Original instructions: Take 1 tablet (5 mg total) by mouth daily. ?What changed: additional instructions ?  ? ?  ? ? ?Discharge Assessment: ?Vitals:  ? 12/02/21 0753 12/02/21 1141  ?BP: 103/79 109/71  ?Pulse: 95   ?Resp:    ?Temp: 97.8 ?F (36.6 ?C) (!) 97.5 ?F (36.4 ?C)  ?SpO2: 100% 97%  ? Skin clean, dry and intact without evidence of skin break down, no evidence of skin tears noted. ?IV catheter discontinued intact. Site without signs and symptoms of complications - no redness or edema noted at insertion site, patient denies c/o pain -  only slight tenderness at site.  Dressing with slight pressure applied. ? ?D/c Instructions-Education: ?Discharge instructions given to patient/family with verbalized understanding. ?D/c education completed with patient/family including follow up instructions, medication list, d/c activities limitations if indicated, with other d/c instructions as indicated by MD - patient able to verbalize understanding, all questions fully answered. ?Patient instructed to return to ED, call 911, or call MD for any changes in condition.  ?Patient escorted via [place], and D/C home via private auto. ? ?Petros Ahart, Jolene Schimke, RN ?12/02/2021 12:07 PM  ?

## 2021-12-04 ENCOUNTER — Telehealth: Payer: Self-pay

## 2021-12-04 ENCOUNTER — Ambulatory Visit (INDEPENDENT_AMBULATORY_CARE_PROVIDER_SITE_OTHER): Payer: PPO

## 2021-12-04 DIAGNOSIS — I255 Ischemic cardiomyopathy: Secondary | ICD-10-CM | POA: Diagnosis not present

## 2021-12-04 NOTE — Telephone Encounter (Signed)
Transition Care Management Unsuccessful Follow-up Telephone Call ? ?Date of discharge and from where:  12/02/21 - [place] - acute on chronic CHF ? ?Attempts:  1st Attempt ? ?Reason for unsuccessful TCM follow-up call:  Left voice message ? ?(Looks like Dr Livia Snellen on call Monday 4/3 - will try to make appt for then) ? ?  ?

## 2021-12-04 NOTE — Telephone Encounter (Signed)
Transition Care Management Follow-up Telephone Call ?Date of discharge and from where: 12/01/21 - [place] - acute on Chronic CHF ?How have you been since you were released from the hospital? Doing a little better, very sore throat and weak ?Any questions or concerns? No ? ?Items Reviewed: ?Did the pt receive and understand the discharge instructions provided? Yes  ?Medications obtained and verified? Yes  ?Other? No  ?Any new allergies since your discharge? No  ?Dietary orders reviewed? Yes ?Do you have support at home? Yes  ? ?Home Care and Equipment/Supplies: ?Were home health services ordered? No ? ?Were any new equipment or medical supplies ordered?  No ? ?Functional Questionnaire: (I = Independent and D = Dependent) ?ADLs: I ? ?Bathing/Dressing- I ? ?Meal Prep- I ? ?Eating- I ? ?Maintaining continence- I ? ?Transferring/Ambulation- I ? ?Managing Meds- I ? ?Follow up appointments reviewed: ? ?PCP Hospital f/u appt confirmed? Yes  Scheduled to see Dr Livia Snellen on 12/05/21 @ 2:10. ?[facility] f/u appt confirmed? Yes  Scheduled to see heart and vascular specialist on 12/11/21 @ 11. ?Are transportation arrangements needed? No  ?If their condition worsens, is the pt aware to call PCP or go to the Emergency Dept.? Yes ?Was the patient provided with contact information for the PCP's office or ED? Yes ?Was to pt encouraged to call back with questions or concerns? Yes  ?

## 2021-12-05 ENCOUNTER — Ambulatory Visit (INDEPENDENT_AMBULATORY_CARE_PROVIDER_SITE_OTHER): Payer: PPO | Admitting: Family Medicine

## 2021-12-05 ENCOUNTER — Encounter: Payer: Self-pay | Admitting: Family Medicine

## 2021-12-05 VITALS — BP 101/58 | HR 83 | Temp 97.0°F | Ht 71.0 in | Wt 187.0 lb

## 2021-12-05 DIAGNOSIS — Z9889 Other specified postprocedural states: Secondary | ICD-10-CM | POA: Diagnosis not present

## 2021-12-05 DIAGNOSIS — I350 Nonrheumatic aortic (valve) stenosis: Secondary | ICD-10-CM

## 2021-12-05 DIAGNOSIS — I130 Hypertensive heart and chronic kidney disease with heart failure and stage 1 through stage 4 chronic kidney disease, or unspecified chronic kidney disease: Secondary | ICD-10-CM

## 2021-12-05 DIAGNOSIS — Z95810 Presence of automatic (implantable) cardiac defibrillator: Secondary | ICD-10-CM | POA: Diagnosis not present

## 2021-12-05 DIAGNOSIS — Z7901 Long term (current) use of anticoagulants: Secondary | ICD-10-CM | POA: Diagnosis not present

## 2021-12-05 LAB — CUP PACEART REMOTE DEVICE CHECK
Battery Remaining Longevity: 101 mo
Battery Remaining Percentage: 83 %
Battery Voltage: 2.99 V
Brady Statistic RV Percent Paced: 1 %
Date Time Interrogation Session: 20230325140044
HighPow Impedance: 79 Ohm
Implantable Lead Implant Date: 20210611
Implantable Lead Location: 753860
Implantable Pulse Generator Implant Date: 20210611
Lead Channel Impedance Value: 390 Ohm
Lead Channel Pacing Threshold Amplitude: 1 V
Lead Channel Pacing Threshold Pulse Width: 0.5 ms
Lead Channel Sensing Intrinsic Amplitude: 12 mV
Lead Channel Setting Pacing Amplitude: 2 V
Lead Channel Setting Pacing Pulse Width: 0.5 ms
Lead Channel Setting Sensing Sensitivity: 0.5 mV
Pulse Gen Serial Number: 810002163

## 2021-12-05 LAB — COAGUCHEK XS/INR WAIVED
INR: 3.9 — ABNORMAL HIGH (ref 0.9–1.1)
Prothrombin Time: 47.3 s

## 2021-12-05 NOTE — Progress Notes (Signed)
? ?Subjective:  ?Patient ID: John [name], male    DOB: 19-Jul-1951  Age: 71 y.o. MRN: [id number] ? ?CC: Transitions Of Care ? ? ?HPI ?[name] presents for follow up of hospitalization for CHF and renal insufficiency.He was admitted to the hospital on 3/10 and discharged on 12/02/2021. He was diuresed of 30 lb of fluid which had accumulated when I had decreased his diuretic as a result of a climbing creatinine. When the fluid increased he callled Dr. Percival Spanish who had him admitted for CHF. He has lost 30 lb during his hospital stay. Creatinine fell to 2.60 at discharge after a peak of 3.46 at admission.  ? ?Heart cath showed:1. Right and left heart filling pressures remain elevated.  ?2. 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  ?3. Preserved cardiac output.  ?4. Mild aortic stenosis by cath.  ? ?Pt. Has history of Mitral valve replacement with #33 [employer] ? ?TEE impressions as below: ? ? ?Left ventricular ejection fraction, by estimation, is 20 to 25%. The left ventricle has ?severely decreased function. The left ventricle demonstrates global hypokinesis. The ?left ventricular internal cavity size was moderately dilated. Left ventricular diastolic ?parameters are indeterminate. No LV thrombus. ?Right ventricular systolic function is severely reduced. The right ventricular size is ?normal. There is severely elevated pulmonary artery systolic pressure. The estimated ?right ventricular systolic pressure is 64.3 mmHg. ?3. Left atrial size was severely dilated. ?4. Right atrial size was mildly dilated. ?5. Moderate pleural effusion in the left lateral region. ?The mitral valve has been replaced by a mechanical bileafelt valve. No evidence of ?mitral valve regurgitation, no PVL. ?6. ?Tricuspid valve regurgitation is moderate to severe. There is systolic flow reversal of ?the hepatic vein. ?7. ?The aortic valve is calcified. Aortic valve regurgitation is moderate. Moderate aortic ?valve  stenosis. Aortic valve mean gradient measures 15.0 mmHg. There is a ?component of low flow low gradient aortic stenosis. ?8. ?The inferior vena cava is dilated in size with >50% respiratory variability, suggesting ?right atrial pressure of 8 mmHg. ?9. ?Comparison(s): RV function is worse from prior. ? ?Due to the severe compromise of cardiac cfunction noted above and the artificial valve pt. Is using chronic anticoagulation with both coumadin and plavix.   ? ? ? ? ?  11/13/2021  ?  9:30 AM 11/08/2021  ?  9:00 AM 11/08/2021  ?  8:57 AM  ?Depression screen PHQ 2/9  ?Decreased Interest 0 0 0  ?Down, Depressed, Hopeless 0 0 0  ?PHQ - 2 Score 0 0 0  ?Altered sleeping  1   ?Tired, decreased energy  1   ?Change in appetite  0   ?Feeling bad or failure about yourself   0   ?Trouble concentrating  0   ?Moving slowly or fidgety/restless  0   ?PHQ-9 Score  2   ?Difficult doing work/chores  Not difficult at all   ? ? 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  ? ?He has a  past surgical history that includes Mitral valve replacement (11/2000); laparotomy (N/A, 02/09/2013); Portacath placement (N/A, 02/09/2013); Coronary artery bypass graft (2002); Septoplasty; Application if wound vac (2014); Colonoscopy;  Esophagogastroduodenoscopy; Laparoscopic incisional / umbilical / ventral hernia repair (03/15/2014); Port-a-cath removal (03/15/2014); Hernia repair; Cardiac catheterization (2002); Incisional hernia repair (N/A, 03/15/2014); Port-a-cath removal (Left, 03/15/2014); Insertion of mesh (N/A, 03/15/2014); RIGHT/LEFT HEART CATH AND CORONARY/GRAFT ANGIOGRAPHY (N/A, 05/10/2017); Coronary artery bypass graft; Small intestine surgery; ICD IMPLANT (N/A, 02/19/2020); RIGHT/LEFT HEART CATH AND CORONARY/GRAFT ANGIOGRAPHY (N/A, 06/23/2020); CORONARY STENT INTERVENTION (N/A, 06/24/2020); CORONARY ATHERECTOMY (N/A, 06/24/2020); CORONARY BALLOON ANGIOPLASTY (N/A, 06/24/2020); TEE without cardioversion (N/A, 11/21/2021); and RIGHT AND LEFT HEART CATH (N/A, 11/27/2021).  ? ?His family history includes Arthritis in his brother; Depression in his brother; Diabetes in his brother, brother, and mother; Heart disease in his brother and brother; Hyperlipidemia in his brother and brother; Hypertension in his brother, brother, and father; Thyroid disease in his sister.He reports that he has quit smoking. His smoking use included cigars. He has never used smokeless tobacco. He reports that he does not drink alcohol and does not use drugs. ? ? ? ?ROS ?Review of Systems  ?Constitutional:  Positive for fatigue.  ?Respiratory:  Negative for shortness of breath.   ?Cardiovascular:  Negative for leg swelling (resolved with hospitalization).  ?Neurological:  Positive for weakness (mucle wasting states wife - noted in arms and legs).  ? ?Objective:  ?BP (!) 101/58   Pulse 83   Temp (!) 97 ?F (36.1 ?C)   Ht '5\' 11"'$  (1.803 m)   Wt 187 lb (84.8 kg)   SpO2 98%   BMI 26.08 kg/m?  ? ?BP Readings from Last 3 Encounters:  ?12/05/21 (!) 101/58  ?12/02/21 109/71  ?11/17/21 100/61  ? ? ?Wt Readings from Last 3 Encounters:  ?12/05/21 187 lb (84.8 kg)  ?12/02/21 188 lb 8 oz (85.5 kg)  ?11/17/21 230 lb 3.2 oz (104.4 kg)  ? ? ? ?Physical Exam ?Constitutional:   ?   General:  He is not in acute distress. ?   Appearance: He is well-developed.  ?HENT:  ?   Head: Normocephalic and atraumatic.  ?   Right Ear: External ear normal.  ?   Left Ear: External ear normal.  ?   Nose: Nose normal.  ?Eyes:  ?   Conjunctiva/sclera: Conjunctivae normal.  ?   Pupils: Pupils are equal, round, and reactive to light.  ?Cardiovascular:  ?   Rate and Rhythm: Normal rate and regular rhythm.  ?   Heart sounds: Murmur heard.  ?Pulmonary:  ?   Effort: Pulmonary effort is normal. No respiratory distress.  ?   Breath sounds: Normal breath sounds. No wheezing or rales.  ?Abdominal:  ?   Palpations: Abdomen is soft.  ?   Tenderness: There is no abdominal tenderness.  ?Musculoskeletal:     ?   General: Normal range of motion.  ?   Cervical back: Normal range of motion and neck supple.  ?Skin: ?   General: Skin is warm and dry.  ?Neurological:  ?   Mental Status: He is alert and oriented to person, place, and time.  ?   Deep Tendon Reflexes: Reflexes are normal and symmetric.  ?Psychiatric:     ?   Behavior: Behavior normal.     ?   Thought Content: Thought content normal.     ?   Judgment: Judgment normal.  ? ? ? ? ?Assessment & Plan:  ? ? ? ? ?I am  having Fredy L. Blatz maintain his nitroGLYCERIN, insulin glargine, sodium bicarbonate, OneTouch Delica Lancets 57W, rosuvastatin, warfarin, clopidogrel, glipiZIDE, pantoprazole, OneTouch Verio, metoprolol succinate, torsemide, predniSONE, and Januvia. ? ?Allergies as of 12/05/2021   ? ?   Reactions  ? Allopurinol Shortness Of Breath  ? Doxycycline Hives  ? Wilder Glade [dapagliflozin]   ? Can not tolerate - dizzy weak almost passed out  ? ?  ? ?  ?Medication List  ?  ? ?  ? Accurate as of December 05, 2021  9:02 PM. If you have any questions, ask your nurse or doctor.  ?  ?  ? ?  ? ?clopidogrel 75 MG tablet ?Commonly known as: PLAVIX ?TAKE 1 TABLET BY MOUTH DAILY WITH BREAKFAST. ?  ?glipiZIDE 10 MG 24 hr tablet ?Commonly known as: GLUCOTROL XL ?Take 1 tablet (10 mg total) by  mouth daily. ?  ?insulin glargine 100 UNIT/ML injection ?Commonly known as: LANTUS ?Inject 0.7 mLs (70 Units total) into the skin at bedtime. ?What changed:  ?when to take this ?additional instructions ?  ?Januvia 50 MG table

## 2021-12-08 ENCOUNTER — Ambulatory Visit: Payer: PPO | Admitting: Family Medicine

## 2021-12-08 NOTE — Progress Notes (Signed)
? ?ADVANCED HF CLINIC CONSULT NOTE ? ? ?Primary Care: Claretta Fraise, MD ?Cardiology: Dr. Percival Spanish ?HF Cardiologist: Dr. [name] ? ?HPI: ?John [name] is a 71 y.o. with a history of CAD, S/P CABG 2002, GERD, HLD, HTN,  Mechanical MVR 2002, rheumatic fever, DMII, chronic A fib, St Jude ICD, CKD Stage IIIb, and HFrEF. ?  ?Admitted 3/23 from Dr. Rosezella [place] office with a/c CHF exacerbation.  Echo showed EF 20-25%, RV severely HK, mechanical MV with mean gradient 6, mod-severe TR, moderate AI, ?low flow/low gradient moderate-severe AS with mean gradient 15/AVA 0.96 cm^2.  TEE  showed EF around 25% but only mildly decreased RV function and trivial TR, normally functioning mechanical MV and abnormal aortic valve with moderate-severe AS and AI. Diuresed with IV lasix, augmented with milrinone. 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 Discussed with Structural Heart Team and not felt to be a good TAVR candidate. Milrinone weaned off and GDMT resumed, but limited by CKD. Discharged home, weight 188 lbs. ? ?Today he returns for HF follow up with his wife. Overall feeling fine. Denies increasing SOB, CP, dizziness, edema, or PND/Orthopnea. Appetite ok. No fever or chills. Weight at home 180 pounds. Taking all medications.  ? ?ECG (personally reviewed): atrial fibrillation 86 bpm ? ?Labs (3/23): K 4.4, creatinine 2.45 ? ?Cardiac Testing  ?- Echo (3/23): EF 20-25%, RV severely HK, mechanical MV with mean gradient 6, mod-severe TR, moderate AI, ?low flow/low gradient moderate-severe AS with mean gradient 15/AVA 0.96 cm^2. ? ?- TEE (3/23): EF around 25% but only mildly decreased RV function and trivial TR, normally functioning mechanical MV and abnormal aortic valve with moderate-severe AS and AI. ? ?- R/LHC (3/23): ?Right and left heart filling pressures remain elevated.  ?2. 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  ?3. Preserved cardiac output.  ?4. Mild aortic stenosis by cath.  ? ?RA mean 13 ?RV 67/14 ?PA 65/33, mean 46 ?PCWP mean 22 ?LV 117/25 ?AO 105/52 ? ?Oxygen saturations: ?PA 59% ?AO 97% ? ?Cardiac Output (Fick) 4.87  ?Cardiac Index (Fick) 2.3 ?PVR 4.9 WU ? ?Aortic valve mean gradient 13.6 mmHg, AVA 1.61 cm^2 ?  ? ?- Echo (2021): EF 25-30%  ?- Echo (2019): EF 30-35%  ?  ?- RHC/LHC (10/21): ?Ost Cx lesion is 85% stenosed. ?Mid LAD lesion is 100% stenosed. ?Prox RCA lesion is 30% stenosed. ?Prox RCA to Dist RCA lesion is 80% stenosed. ?Origin lesion is 100% stenosed. ?Prox LAD to Mid LAD lesion is 70% stenosed. ?Successful PCI using orbital atherectomy and drug-eluting stent placement in the distal LMCA into the proximal LCx using a Resolute Onyx 2.75 x 26 mm drug-eluting stent.  Plaque shift into the ramus intermedius necessitated kissing balloon inflation.  Final angiogram demonstrates 0% residual stenosis with TIMI-3 flow. ?  ? ?Review of Systems: [y] = yes, '[ ]'$  = no  ? ?General: Weight gain '[ ]'$ ; Weight loss '[ ]'$ ; Anorexia '[ ]'$ ; Fatigue Blue.Reese ]; Fever '[ ]'$ ; Chills '[ ]'$ ; Weakness '[ ]'$   ?Cardiac: Chest pain/pressure '[ ]'$ ; Resting SOB '[ ]'$ ; Exertional SOB Blue.Reese ]; Orthopnea '[ ]'$ ; Pedal Edema '[ ]'$ ; Palpitations '[ ]'$ ; Syncope '[ ]'$ ; Presyncope '[ ]'$ ; Paroxysmal nocturnal dyspnea'[ ]'$   ?Pulmonary: Cough '[ ]'$ ; Wheezing'[ ]'$ ; Hemoptysis'[ ]'$ ; Sputum '[ ]'$ ; Snoring '[ ]'$   ?GI: Vomiting'[ ]'$ ; Dysphagia'[ ]'$ ; Melena'[ ]'$ ; Hematochezia '[ ]'$ ; Heartburn'[ ]'$ ; Abdominal pain '[ ]'$ ; Constipation '[ ]'$ ; Diarrhea '[ ]'$ ; BRBPR '[ ]'$   ?  GU: Hematuria'[ ]'$ ; Dysuria '[ ]'$ ; Nocturia'[ ]'$   ?Vascular: Pain in legs with walking '[ ]'$ ; Pain in feet with lying flat '[ ]'$ ; Non-healing sores '[ ]'$ ; Stroke '[ ]'$ ; TIA '[ ]'$ ; Slurred speech '[ ]'$ ;  ?Neuro: Headaches'[ ]'$ ; Vertigo'[ ]'$ ; Seizures'[ ]'$ ; Paresthesias'[ ]'$ ;Blurred vision '[ ]'$ ; Diplopia '[ ]'$ ; Vision changes '[ ]'$   ?Ortho/Skin: Arthritis '[ ]'$ ; Joint pain '[ ]'$ ; Muscle pain '[ ]'$ ; Joint swelling '[ ]'$ ; Back Pain '[ ]'$ ; Rash '[ ]'$   ?Psych: Depression'[ ]'$ ;  Anxiety'[ ]'$   ?Heme: Bleeding problems '[ ]'$ ; Clotting disorders '[ ]'$ ; Anemia Blue.Reese ]  ?Endocrine: Diabetes [ y]; Thyroid dysfunction'[ ]'$  ? ?Past Medical History:  ?Diagnosis Date  ? Abnormal nuclear stress test 05/10/2017  ? Allergy   ? Anemia   ? "lost blood w/the cancer"  ? Arthritis   ? back   ? Atrial fibrillation ([place])   ? takes Coumadin daily  ? Bruises easily   ? takes Coumadin daily  ? CAD (coronary artery disease)   ? a. s/p CABG 11/2000 (L-LAD, S-RCA);  b. Lexiscan Myoview 11/12: EF 45%, ischemia and possibly some scar at the base of the inferolateral wall;   c. Lex MV 11/13:  EF 44%, Inf and IL scar/soft tissue atten, small mild amt of inf ischemia,  ? CHF (congestive heart failure) ([place])   ? Constipation   ? takes Colace daily  ? 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 Enlarged prostate   ? Flu 09/2013  ? GERD (gastroesophageal reflux disease)   ? takes Nexium daily  ? Glaucoma   ? History of blood transfusion   ? "related to cancer"  ? History of colon polyps   ? HLD (hyperlipidemia)   ? takes Vytorin daily  ? HTN (hypertension)   ? takes Amlodipine,Metoprolol, and Ramipril daily  ? Insomnia   ? states he has always been like this.But doesn't take any meds  ? Kidney stones   ? "I passed them all"  ? Nocturia   ? Peripheral edema   ? takes Furosemide daily  ? Peripheral neuropathy   ? Rheumatic fever ~ 1965  ? Rheumatic heart disease   ? a. s/p mechanical (St. Jude) MVR 2002;  b. Echo 5/11: Mild LVH, EF 45-50%, mild AS, mild AI, mean gradient 11 mmHg, MVR with normal gradients, moderate LAE, mild RAE;  c.  Echo 11/13:   mod LVH, EF 55-60%, mild AS (mean 18 mmHg), mild AI, severe LAE, mild RAE, PASP 41  ? 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 SOB (shortness of breath)   ? with exertion  ? Type II diabetes mellitus ([place])   ? takes Januvia,Glipizide,and Metformin daily as well as Lantus  ? Unstable angina ([place]) 06/21/2020  ? ?Current Outpatient Medications  ?Medication  Sig Dispense Refill  ? clopidogrel (PLAVIX) 75 MG tablet TAKE 1 TABLET BY MOUTH DAILY WITH BREAKFAST. 90 tablet 3  ? glipiZIDE (GLUCOTROL XL) 10 MG 24 hr tablet Take 1 tablet (10 mg total) by mouth daily. 90 tablet 3  ? insulin glargine (LANTUS) 100 UNIT/ML injection Inject 0.7 mLs (70 Units total) into the skin at bedtime. (Patient taking differently: Inject 70 Units into the skin. Take 70 units if over 150 daily) 10 mL 11  ? JANUVIA 50 MG tablet Take 0.5 tablets (25 mg total) by mouth daily. 90 tablet 2  ? metoprolol succinate (TOPROL-XL) 25 MG 24 hr tablet Take 0.5 tablets (12.5  mg total) by mouth daily. 90 tablet 3  ? nitroGLYCERIN (NITROSTAT) 0.4 MG SL tablet Place 1 tablet (0.4 mg total) under the tongue every 5 (five) minutes as needed for chest pain. 50 tablet 10  ? OneTouch Delica Lancets 41O MISC TEST 2 TIMES DAILY AS DIRECTED 100 each 5  ? ONETOUCH VERIO test strip USE TO TEST TWICE A DAY 200 strip 3  ? pantoprazole (PROTONIX) 40 MG tablet Take 1 tablet (40 mg total) by mouth daily. 90 tablet 3  ? rosuvastatin (CRESTOR) 20 MG tablet 20 mg. Patient takes 1 tablet by mouth every other day.    ? sodium bicarbonate 650 MG tablet Take 650 mg by mouth 2 (two) times daily.    ? torsemide (DEMADEX) 20 MG tablet Take 2 tablets ('40mg'$  total) by mouth every morning and 1 tablet ('20mg'$  total) by mouth every evening 270 tablet 3  ? warfarin (COUMADIN) 5 MG tablet Take 1 tablet (5 mg total) by mouth daily. (Patient taking differently: Take 5 mg by mouth daily. Alternates 2.5 x2 days then '5mg'$  alternating) 90 tablet 3  ? ?No current facility-administered medications for this encounter.  ? ?Allergies  ?Allergen Reactions  ? Allopurinol Shortness Of Breath  ? Doxycycline Hives  ? Wilder Glade [Dapagliflozin]   ?  Can not tolerate - dizzy weak almost passed out  ? ? ?Social History  ? ?Socioeconomic History  ? Marital status: Married  ?  Spouse name: Marcie Bal  ? Number of children: 1  ? Years of education: 81  ? Highest education  level: High school graduate  ?Occupational History  ? Occupation: disabled  ?Tobacco Use  ? Smoking status: Former  ?  Types: Cigars  ? Smokeless tobacco: Never  ?Vaping Use  ? Vaping Use: Never used  ?Substa

## 2021-12-11 ENCOUNTER — Ambulatory Visit (HOSPITAL_COMMUNITY)
Admit: 2021-12-11 | Discharge: 2021-12-11 | Disposition: A | Discharge status: Home / Self Care | Payer: PPO | Attending: Family Medicine | Admitting: Family Medicine

## 2021-12-11 ENCOUNTER — Encounter (HOSPITAL_COMMUNITY): Payer: Self-pay

## 2021-12-11 VITALS — BP 112/60 | HR 92 | Wt 185.0 lb

## 2021-12-11 DIAGNOSIS — I359 Nonrheumatic aortic valve disorder, unspecified: Secondary | ICD-10-CM

## 2021-12-11 DIAGNOSIS — R7401 Elevation of levels of liver transaminase levels: Secondary | ICD-10-CM | POA: Diagnosis not present

## 2021-12-11 DIAGNOSIS — I2582 Chronic total occlusion of coronary artery: Secondary | ICD-10-CM | POA: Diagnosis not present

## 2021-12-11 DIAGNOSIS — N179 Acute kidney failure, unspecified: Secondary | ICD-10-CM | POA: Insufficient documentation

## 2021-12-11 DIAGNOSIS — I25810 Atherosclerosis of coronary artery bypass graft(s) without angina pectoris: Secondary | ICD-10-CM | POA: Diagnosis not present

## 2021-12-11 DIAGNOSIS — K219 Gastro-esophageal reflux disease without esophagitis: Secondary | ICD-10-CM | POA: Diagnosis not present

## 2021-12-11 DIAGNOSIS — I272 Pulmonary hypertension, unspecified: Secondary | ICD-10-CM | POA: Insufficient documentation

## 2021-12-11 DIAGNOSIS — Z8679 Personal history of other diseases of the circulatory system: Secondary | ICD-10-CM | POA: Insufficient documentation

## 2021-12-11 DIAGNOSIS — Z952 Presence of prosthetic heart valve: Secondary | ICD-10-CM | POA: Diagnosis not present

## 2021-12-11 DIAGNOSIS — I130 Hypertensive heart and chronic kidney disease with heart failure and stage 1 through stage 4 chronic kidney disease, or unspecified chronic kidney disease: Secondary | ICD-10-CM | POA: Diagnosis not present

## 2021-12-11 DIAGNOSIS — Z7901 Long term (current) use of anticoagulants: Secondary | ICD-10-CM | POA: Insufficient documentation

## 2021-12-11 DIAGNOSIS — Z833 Family history of diabetes mellitus: Secondary | ICD-10-CM | POA: Insufficient documentation

## 2021-12-11 DIAGNOSIS — I2510 Atherosclerotic heart disease of native coronary artery without angina pectoris: Secondary | ICD-10-CM | POA: Diagnosis not present

## 2021-12-11 DIAGNOSIS — Z955 Presence of coronary angioplasty implant and graft: Secondary | ICD-10-CM | POA: Diagnosis not present

## 2021-12-11 DIAGNOSIS — Z951 Presence of aortocoronary bypass graft: Secondary | ICD-10-CM | POA: Diagnosis not present

## 2021-12-11 DIAGNOSIS — Z8349 Family history of other endocrine, nutritional and metabolic diseases: Secondary | ICD-10-CM | POA: Insufficient documentation

## 2021-12-11 DIAGNOSIS — R9431 Abnormal electrocardiogram [ECG] [EKG]: Secondary | ICD-10-CM | POA: Insufficient documentation

## 2021-12-11 DIAGNOSIS — I5022 Chronic systolic (congestive) heart failure: Secondary | ICD-10-CM | POA: Diagnosis not present

## 2021-12-11 DIAGNOSIS — E1122 Type 2 diabetes mellitus with diabetic chronic kidney disease: Secondary | ICD-10-CM

## 2021-12-11 DIAGNOSIS — E1142 Type 2 diabetes mellitus with diabetic polyneuropathy: Secondary | ICD-10-CM | POA: Diagnosis not present

## 2021-12-11 DIAGNOSIS — N183 Chronic kidney disease, stage 3 unspecified: Secondary | ICD-10-CM

## 2021-12-11 DIAGNOSIS — N1832 Chronic kidney disease, stage 3b: Secondary | ICD-10-CM | POA: Insufficient documentation

## 2021-12-11 DIAGNOSIS — Z7982 Long term (current) use of aspirin: Secondary | ICD-10-CM | POA: Insufficient documentation

## 2021-12-11 DIAGNOSIS — D509 Iron deficiency anemia, unspecified: Secondary | ICD-10-CM | POA: Diagnosis not present

## 2021-12-11 DIAGNOSIS — I4819 Other persistent atrial fibrillation: Secondary | ICD-10-CM | POA: Diagnosis not present

## 2021-12-11 DIAGNOSIS — Z7984 Long term (current) use of oral hypoglycemic drugs: Secondary | ICD-10-CM | POA: Insufficient documentation

## 2021-12-11 DIAGNOSIS — E785 Hyperlipidemia, unspecified: Secondary | ICD-10-CM | POA: Insufficient documentation

## 2021-12-11 DIAGNOSIS — Z95810 Presence of automatic (implantable) cardiac defibrillator: Secondary | ICD-10-CM | POA: Insufficient documentation

## 2021-12-11 DIAGNOSIS — Z7902 Long term (current) use of antithrombotics/antiplatelets: Secondary | ICD-10-CM | POA: Diagnosis not present

## 2021-12-11 DIAGNOSIS — I082 Rheumatic disorders of both aortic and tricuspid valves: Secondary | ICD-10-CM | POA: Insufficient documentation

## 2021-12-11 DIAGNOSIS — Z9889 Other specified postprocedural states: Secondary | ICD-10-CM

## 2021-12-11 DIAGNOSIS — Z79899 Other long term (current) drug therapy: Secondary | ICD-10-CM | POA: Diagnosis not present

## 2021-12-11 DIAGNOSIS — Z8249 Family history of ischemic heart disease and other diseases of the circulatory system: Secondary | ICD-10-CM | POA: Insufficient documentation

## 2021-12-11 DIAGNOSIS — I4510 Unspecified right bundle-branch block: Secondary | ICD-10-CM | POA: Insufficient documentation

## 2021-12-11 DIAGNOSIS — I4821 Permanent atrial fibrillation: Secondary | ICD-10-CM | POA: Insufficient documentation

## 2021-12-11 NOTE — Patient Instructions (Signed)
No changes to your medications. ? ?No labs today. ? ?Your physician recommends that you schedule a follow-up appointment in: 6 weeks here with Janett Billow and 4 months with Dr. [name] ( August 20023)  **Call the office in June to arrange your follow up appointment ** ? ? ?If you have any questions or concerns before your next appointment please send [place] a message through [place] or call our office at [phone].   ? ?TO LEAVE A MESSAGE FOR THE NURSE SELECT OPTION 2, PLEASE LEAVE A MESSAGE INCLUDING: ?YOUR NAME ?DATE OF BIRTH ?CALL BACK NUMBER ?REASON FOR CALL**this is important as we prioritize the call backs ? ?YOU WILL RECEIVE A CALL BACK THE SAME DAY AS LONG AS YOU CALL BEFORE 4:00 PM ? ?At the [facility], you and your health needs are our priority. As part of our continuing mission to provide you with exceptional heart care, we have created designated Provider Care Teams. These Care Teams include your primary Cardiologist (physician) and Advanced Practice Providers (APPs- Physician Assistants and Nurse Practitioners) who all work together to provide you with the care you need, when you need it.  ? ?You may see any of the following providers on your designated Care Team at your next follow up: ?Dr Glori Bickers ?Dr Loralie Champagne ?Darrick Grinder, NP ?Lyda Jester, PA ?Jessica Milford,NP ?Marlyce Huge, PA ?Audry Riles, PharmD ? ? ?Please be sure to bring in all your medications bottles to every appointment.  ? ?

## 2021-12-12 LAB — CMP14+EGFR
ALT: 15 IU/L (ref 0–44)
AST: 21 IU/L (ref 0–40)
Albumin/Globulin Ratio: 1.4 (ref 1.2–2.2)
Albumin: 4.3 g/dL (ref 3.8–4.8)
Alkaline Phosphatase: 209 IU/L — ABNORMAL HIGH (ref 44–121)
BUN/Creatinine Ratio: 66 — ABNORMAL HIGH (ref 10–24)
BUN: 161 mg/dL (ref 8–27)
Bilirubin Total: 1.1 mg/dL (ref 0.0–1.2)
CO2: 23 mmol/L (ref 20–29)
Calcium: 9.5 mg/dL (ref 8.6–10.2)
Chloride: 95 mmol/L — ABNORMAL LOW (ref 96–106)
Creatinine, Ser: 2.45 mg/dL — ABNORMAL HIGH (ref 0.76–1.27)
Globulin, Total: 3 g/dL (ref 1.5–4.5)
Glucose: 125 mg/dL — ABNORMAL HIGH (ref 70–99)
Potassium: 4.4 mmol/L (ref 3.5–5.2)
Sodium: 143 mmol/L (ref 134–144)
Total Protein: 7.3 g/dL (ref 6.0–8.5)
eGFR: 28 mL/min/{1.73_m2} — ABNORMAL LOW (ref >59)

## 2021-12-13 ENCOUNTER — Emergency Department (HOSPITAL_COMMUNITY): Payer: PPO

## 2021-12-13 ENCOUNTER — Other Ambulatory Visit: Payer: Self-pay

## 2021-12-13 ENCOUNTER — Encounter (HOSPITAL_COMMUNITY): Payer: Self-pay | Admitting: *Deleted

## 2021-12-13 ENCOUNTER — Emergency Department (HOSPITAL_COMMUNITY)
Admission: EM | Admit: 2021-12-13 | Discharge: 2021-12-13 | Discharge status: Against Medical Advice | Payer: PPO | Attending: Physician Assistant | Admitting: Physician Assistant

## 2021-12-13 DIAGNOSIS — Z5321 Procedure and treatment not carried out due to patient leaving prior to being seen by health care provider: Secondary | ICD-10-CM | POA: Diagnosis not present

## 2021-12-13 DIAGNOSIS — R0602 Shortness of breath: Secondary | ICD-10-CM | POA: Diagnosis not present

## 2021-12-13 DIAGNOSIS — I509 Heart failure, unspecified: Secondary | ICD-10-CM | POA: Diagnosis not present

## 2021-12-13 DIAGNOSIS — R109 Unspecified abdominal pain: Secondary | ICD-10-CM | POA: Insufficient documentation

## 2021-12-13 DIAGNOSIS — R079 Chest pain, unspecified: Secondary | ICD-10-CM | POA: Diagnosis not present

## 2021-12-13 DIAGNOSIS — F419 Anxiety disorder, unspecified: Secondary | ICD-10-CM | POA: Diagnosis not present

## 2021-12-13 DIAGNOSIS — R0600 Dyspnea, unspecified: Secondary | ICD-10-CM | POA: Diagnosis not present

## 2021-12-13 LAB — CBC WITH DIFFERENTIAL/PLATELET
Abs Immature Granulocytes: 0.04 10*3/uL (ref 0.00–0.07)
Basophils Absolute: 0 10*3/uL (ref 0.0–0.1)
Basophils Relative: 1 %
Eosinophils Absolute: 0.1 10*3/uL (ref 0.0–0.5)
Eosinophils Relative: 1 %
HCT: 41.4 % (ref 39.0–52.0)
Hemoglobin: 13.1 g/dL (ref 13.0–17.0)
Immature Granulocytes: 1 %
Lymphocytes Relative: 6 %
Lymphs Abs: 0.5 10*3/uL — ABNORMAL LOW (ref 0.7–4.0)
MCH: 27 pg (ref 26.0–34.0)
MCHC: 31.6 g/dL (ref 30.0–36.0)
MCV: 85.4 fL (ref 80.0–100.0)
Monocytes Absolute: 0.5 10*3/uL (ref 0.1–1.0)
Monocytes Relative: 6 %
Neutro Abs: 7 10*3/uL (ref 1.7–7.7)
Neutrophils Relative %: 85 %
Platelets: 184 10*3/uL (ref 150–400)
RBC: 4.85 MIL/uL (ref 4.22–5.81)
RDW: 22.6 % — ABNORMAL HIGH (ref 11.5–15.5)
Smear Review: ADEQUATE
WBC: 8.1 10*3/uL (ref 4.0–10.5)
nRBC: 0 % (ref 0.0–0.2)

## 2021-12-13 LAB — BASIC METABOLIC PANEL
Anion gap: 14 (ref 5–15)
BUN: 103 mg/dL — ABNORMAL HIGH (ref 8–23)
CO2: 25 mmol/L (ref 22–32)
Calcium: 8.9 mg/dL (ref 8.9–10.3)
Chloride: 98 mmol/L (ref 98–111)
Creatinine, Ser: 2.47 mg/dL — ABNORMAL HIGH (ref 0.61–1.24)
GFR, Estimated: 28 mL/min — ABNORMAL LOW (ref >60)
Glucose, Bld: 210 mg/dL — ABNORMAL HIGH (ref 70–99)
Potassium: 3.7 mmol/L (ref 3.5–5.1)
Sodium: 137 mmol/L (ref 135–145)

## 2021-12-13 LAB — BRAIN NATRIURETIC PEPTIDE: B Natriuretic Peptide: 2811 pg/mL — ABNORMAL HIGH (ref 0.0–100.0)

## 2021-12-13 NOTE — ED Notes (Signed)
Patients family member upset about wait time and that he wasn't immediately roomed. States she is taking him elsewhere. Charge rn notified.  ?

## 2021-12-13 NOTE — ED Provider Triage Note (Signed)
Emergency Medicine Provider Triage Evaluation Note ? ?John [name] , a 71 y.o. male  was evaluated in triage.  Pt complains of shortness of breath which began today, reports feeling like a tickle, vibration to his chest.  Hospitalized for 16 days and discharged on prior Saturday.  Reports he has not had a bowel movement in about 1 week.  According to significant other, patient is very anxious about not being able to fully release his bowels. ?Seen buy his Cardiology team yesterday for ongoing CHF  ? ?Review of Systems  ?Positive: Shortness of breath, chest pain, abdominal pain ?Negative: Nausea. Vomiting, fever ? ?Physical Exam  ?BP 99/68 (BP Location: Left Arm)   Pulse 85   Temp 98.2 ?F (36.8 ?C) (Oral)   Resp 20   SpO2 99%  ?Gen:   Awake, no distress   ?Resp:  Normal effort  ?MSK:   Moves extremities without difficulty  ?Other:  In no distress, abdomen is soft nontender to palpation. ? ?Medical Decision Making  ?Medically screening exam initiated at 8:12 PM.  Appropriate orders placed.  Alonso L Baglio was informed that the remainder of the evaluation will be completed by another provider, this initial triage assessment does not replace that evaluation, and the importance of remaining in the ED until their evaluation is complete. ? ? ?Records: ?Echo (3/23): EF 20-25%, RV severely HK, mechanical MV with mean gradient 6, mod-severe TR, moderate AI, ?low flow/low gradient moderate-severe AS with mean gradient 15/AVA 0.96 cm^2. ?  ?Janeece Fitting, PA-C ?12/13/21 2026 ? ?

## 2021-12-13 NOTE — Progress Notes (Signed)
Remote ICD transmission.   

## 2021-12-13 NOTE — ED Triage Notes (Signed)
The pt is very short of breath  just started today  he was recently in the hospital for chf  he also has not had a bm for approximately one week ?

## 2021-12-14 ENCOUNTER — Encounter (HOSPITAL_COMMUNITY): Payer: PPO

## 2021-12-14 DIAGNOSIS — K5900 Constipation, unspecified: Secondary | ICD-10-CM | POA: Diagnosis not present

## 2021-12-14 LAB — ECHO TEE
AR max vel: 1.17 cm2
AV Area VTI: 1.36 cm2
AV Area mean vel: 1.38 cm2
AV Mean grad: 15 mmHg
AV Peak grad: 29.1 mmHg
Ao pk vel: 2.7 m/s
MV VTI: 2.5 cm2
P 1/2 time: 370 msec

## 2021-12-18 ENCOUNTER — Encounter: Payer: Self-pay | Admitting: Family Medicine

## 2021-12-18 ENCOUNTER — Ambulatory Visit (INDEPENDENT_AMBULATORY_CARE_PROVIDER_SITE_OTHER): Payer: PPO | Admitting: Family Medicine

## 2021-12-18 VITALS — BP 100/56 | HR 98 | Temp 98.0°F | Ht 71.0 in | Wt 181.2 lb

## 2021-12-18 DIAGNOSIS — Z7901 Long term (current) use of anticoagulants: Secondary | ICD-10-CM | POA: Diagnosis not present

## 2021-12-18 DIAGNOSIS — I4819 Other persistent atrial fibrillation: Secondary | ICD-10-CM | POA: Diagnosis not present

## 2021-12-18 DIAGNOSIS — E875 Hyperkalemia: Secondary | ICD-10-CM | POA: Diagnosis not present

## 2021-12-18 LAB — POCT INR: INR: 5.3 — AB (ref 2.0–3.0)

## 2021-12-18 LAB — COAGUCHEK XS/INR WAIVED
INR: 5.3 (ref 0.9–1.1)
Prothrombin Time: 63.5 s

## 2021-12-18 NOTE — Progress Notes (Signed)
? ?Subjective:  ?Patient ID: John [name], male    DOB: [birth year]  Age: 71 y.o. MRN: [id number] ? ?CC: Atrial Fibrillation ? ? ?HPI ?John [name] presents for follow-up on his INR.  He has been short of breath quite frequently.  He went to the emergency room a few days ago.  They did a chest x-ray which was normal.  He left prior to any further treatment being offered.  That record was reviewed.  Currently he has no dyspnea at the moment.  He has no significant edema although he does get both intermittently.  He is taking the torsemide regularly.  His ? ?His current main concern is constipation.  He has some lactulose that was given to him at another facility a few days ago.  He is now having problems with incontinence of stool.  He wants to follow through with the lactulose.  He did not have any confidence in the anticonstipation medications that I had recommended ? ? ?  12/18/2021  ?  2:40 PM 11/13/2021  ?  9:30 AM 11/08/2021  ?  9:00 AM  ?Depression screen PHQ 2/9  ?Decreased Interest 0 0 0  ?Down, Depressed, Hopeless 0 0 0  ?PHQ - 2 Score 0 0 0  ?Altered sleeping   1  ?Tired, decreased energy   1  ?Change in appetite   0  ?Feeling bad or failure about yourself    0  ?Trouble concentrating   0  ?Moving slowly or fidgety/restless   0  ?PHQ-9 Score   2  ?Difficult doing work/chores   Not difficult at all  ? ? ?History ?John [name] has a past medical history of Abnormal nuclear stress test (05/10/2017), Allergy, Anemia, Arthritis, Atrial fibrillation ([place]), Bruises easily, CAD (coronary artery disease), CHF (congestive heart failure) ([place]), Constipation, Contrast dye induced nephropathy (07/01/2020), Enlarged prostate, Flu (09/2013), GERD (gastroesophageal reflux disease), Glaucoma, History of blood transfusion, History of colon polyps, HLD (hyperlipidemia), HTN (hypertension), Insomnia, Kidney stones, Nocturia, Peripheral edema, Peripheral neuropathy, Rheumatic fever (~ 1965), Rheumatic heart disease, S/P MVR (mitral  valve replacement) (11/2000), Small bowel cancer ([place]) (2014), SOB (shortness of breath), Type II diabetes mellitus ([place]), and Unstable angina ([place]) (06/21/2020).  ? ?He has a past surgical history that includes Mitral valve replacement (11/2000); laparotomy (N/A, 02/09/2013); Portacath placement (N/A, 02/09/2013); Coronary artery bypass graft (2002); Septoplasty; Application if wound vac (2014); Colonoscopy; Esophagogastroduodenoscopy; Laparoscopic incisional / umbilical / ventral hernia repair (03/15/2014); Port-a-cath removal (03/15/2014); Hernia repair; Cardiac catheterization (2002); Incisional hernia repair (N/A, 03/15/2014); Port-a-cath removal (Left, 03/15/2014); Insertion of mesh (N/A, 03/15/2014); RIGHT/LEFT HEART CATH AND CORONARY/GRAFT ANGIOGRAPHY (N/A, 05/10/2017); Coronary artery bypass graft; Small intestine surgery; ICD IMPLANT (N/A, 02/19/2020); RIGHT/LEFT HEART CATH AND CORONARY/GRAFT ANGIOGRAPHY (N/A, 06/23/2020); CORONARY STENT INTERVENTION (N/A, 06/24/2020); CORONARY ATHERECTOMY (N/A, 06/24/2020); CORONARY BALLOON ANGIOPLASTY (N/A, 06/24/2020); TEE without cardioversion (N/A, 11/21/2021); and RIGHT AND LEFT HEART CATH (N/A, 11/27/2021).  ? ?His family history includes Arthritis in his brother; Depression in his brother; Diabetes in his brother, brother, and mother; Heart disease in his brother and brother; Hyperlipidemia in his brother and brother; Hypertension in his brother, brother, and father; Thyroid disease in his sister.He reports that he has quit smoking. His smoking use included cigars. He has never used smokeless tobacco. He reports that he does not drink alcohol and does not use drugs. ? ? ? ?ROS ?Review of Systems  ?Constitutional:  Negative for fever.  ?Respiratory:  Negative for shortness of breath.   ?Cardiovascular:  Negative for  chest pain.  ?Musculoskeletal:  Negative for arthralgias.  ?Skin:  Negative for rash.  ? ?Objective:  ?BP (!) 100/56   Pulse 98   Temp 98 ?F (36.7 ?C)   Ht '5\' 11"'$  (1.803  m)   Wt 181 lb 3.2 oz (82.2 kg)   SpO2 96%   BMI 25.27 kg/m?  ? ?BP Readings from Last 3 Encounters:  ?12/18/21 (!) 100/56  ?12/13/21 99/68  ?12/11/21 112/60  ? ? ?Wt Readings from Last 3 Encounters:  ?12/18/21 181 lb 3.2 oz (82.2 kg)  ?12/13/21 184 lb 15.5 oz (83.9 kg)  ?12/11/21 185 lb (83.9 kg)  ? ? ? ?Physical Exam ?Vitals reviewed.  ?Constitutional:   ?   Appearance: He is well-developed.  ?HENT:  ?   Head: Normocephalic and atraumatic.  ?   Right Ear: External ear normal.  ?   Left Ear: External ear normal.  ?   Mouth/Throat:  ?   Pharynx: No oropharyngeal exudate or posterior oropharyngeal erythema.  ?Eyes:  ?   Pupils: Pupils are equal, round, and reactive to light.  ?Cardiovascular:  ?   Rate and Rhythm: Normal rate and regular rhythm.  ?   Heart sounds: No murmur heard. ?Pulmonary:  ?   Effort: No respiratory distress.  ?   Breath sounds: Normal breath sounds.  ?Musculoskeletal:  ?   Cervical back: Normal range of motion and neck supple.  ?Neurological:  ?   Mental Status: He is alert and oriented to person, place, and time.  ? ? ? ? ?Assessment & Plan:  ? ?John [name] was seen today for atrial fibrillation. ? ?Diagnoses and all orders for this visit: ? ?Hyperkalemia ?-     Basic Metabolic Panel ? ?Persistent atrial fibrillation ([place]) ?-     CoaguChek XS/INR Waived ? ?Long term (current) use of anticoagulants ? ?Other orders ?-     POCT INR ? ? ? ?Description   ? ? ? ? ?  ? ?Hold Coumadin 2 days.  Then resume with 1/2 tablet on days 1 and 2 and no medication on day 3 follow-up here in 2 weeks ? ? ?I am having John [name] maintain his nitroGLYCERIN, insulin glargine, sodium bicarbonate, OneTouch Delica Lancets 20U, warfarin, clopidogrel, glipiZIDE, pantoprazole, OneTouch Verio, metoprolol succinate, torsemide, Januvia, and rosuvastatin. ? ?Allergies as of 12/18/2021   ? ?   Reactions  ? Allopurinol Shortness Of Breath  ? Doxycycline Hives  ? Wilder Glade [dapagliflozin]   ? Can not tolerate - dizzy weak  almost passed out  ? ?  ? ?  ?Medication List  ?  ? ?  ? Accurate as of December 18, 2021  5:21 PM. If you have any questions, ask your nurse or doctor.  ?  ?  ? ?  ? ?clopidogrel 75 MG tablet ?Commonly known as: PLAVIX ?TAKE 1 TABLET BY MOUTH DAILY WITH BREAKFAST. ?  ?glipiZIDE 10 MG 24 hr tablet ?Commonly known as: GLUCOTROL XL ?Take 1 tablet (10 mg total) by mouth daily. ?  ?insulin glargine 100 UNIT/ML injection ?Commonly known as: LANTUS ?Inject 0.7 mLs (70 Units total) into the skin at bedtime. ?What changed:  ?when to take this ?additional instructions ?  ?Januvia 50 MG tablet ?Generic drug: sitaGLIPtin ?Take 0.5 tablets (25 mg total) by mouth daily. ?  ?metoprolol succinate 25 MG 24 hr tablet ?Commonly known as: TOPROL-XL ?Take 0.5 tablets (12.5 mg total) by mouth daily. ?What changed: how much to take ?  ?nitroGLYCERIN 0.4 MG SL tablet ?  Commonly known as: NITROSTAT ?Place 1 tablet (0.4 mg total) under the tongue every 5 (five) minutes as needed for chest pain. ?  ?OneTouch Delica Lancets 34K Misc ?TEST 2 TIMES DAILY AS DIRECTED ?  ?OneTouch Verio test strip ?Generic drug: glucose blood ?USE TO TEST TWICE A DAY ?  ?pantoprazole 40 MG tablet ?Commonly known as: PROTONIX ?Take 1 tablet (40 mg total) by mouth daily. ?  ?rosuvastatin 20 MG tablet ?Commonly known as: CRESTOR ?20 mg. Patient takes 1 tablet by mouth every other day. ?  ?sodium bicarbonate 650 MG tablet ?Take 650 mg by mouth 2 (two) times daily. ?  ?torsemide 20 MG tablet ?Commonly known as: DEMADEX ?Take 2 tablets ('40mg'$  total) by mouth every morning and 1 tablet ('20mg'$  total) by mouth every evening ?  ?warfarin 5 MG tablet ?Commonly known as: COUMADIN ?Take as directed by the anticoagulation clinic. If you are unsure how to take this medication, talk to your nurse or doctor. ?Original instructions: Take 1 tablet (5 mg total) by mouth daily. ?What changed: additional instructions ?  ? ?  ? ? ? ?Follow-up: Return in about 2 weeks (around  01/01/2022). ? ?Claretta Fraise, M.D. ?

## 2021-12-19 ENCOUNTER — Telehealth: Payer: Self-pay | Admitting: Family Medicine

## 2021-12-19 LAB — BASIC METABOLIC PANEL
BUN/Creatinine Ratio: 41 — ABNORMAL HIGH (ref 10–24)
BUN: 84 mg/dL (ref 8–27)
CO2: 26 mmol/L (ref 20–29)
Calcium: 9.1 mg/dL (ref 8.6–10.2)
Chloride: 97 mmol/L (ref 96–106)
Creatinine, Ser: 2.05 mg/dL — ABNORMAL HIGH (ref 0.76–1.27)
Glucose: 121 mg/dL — ABNORMAL HIGH (ref 70–99)
Potassium: 3.5 mmol/L (ref 3.5–5.2)
Sodium: 144 mmol/L (ref 134–144)
eGFR: 35 mL/min/{1.73_m2} — ABNORMAL LOW (ref >59)

## 2021-12-19 NOTE — Telephone Encounter (Signed)
Patient's wife calling because patient needs to come in on 4/24 for INR recheck. No openings that day and wife said she did not want to push it out because the last time they did the patient ended up having to go to the hospital. Please call back to schedule appt.  ?

## 2021-12-19 NOTE — Telephone Encounter (Signed)
Lmtcb.

## 2021-12-19 NOTE — Telephone Encounter (Signed)
SCHEDULED FOR 4/25 9:40, RETURNED CALL, NO ANSWER ?

## 2021-12-22 ENCOUNTER — Telehealth: Payer: Self-pay

## 2021-12-22 NOTE — Telephone Encounter (Signed)
CALLED WIFE, NO ANSWER, LEFT MESSAGE TO RETURN CALL ?

## 2021-12-22 NOTE — Telephone Encounter (Signed)
Spoke with patient's wife informed her that I did not have any reason for why we did not receive an alert for patients fluid level for which he ended up in the hospital around that time. A schedule transmission was received on 08/23/21 and 12/02/21, patients monitor is up to date as of 12/22/21, offered patients wife enrollment in [facility] clinic with Sharman Cheek patients wife very interested in this informed her that I would send patients information over to her. Patients wife stated its best to call her work number or cell phone number.  ?

## 2021-12-22 NOTE — Telephone Encounter (Signed)
The EKG showed a harmless finding called a right bundle branch block that slows the electrical current through the heart somewhat.  There was also evidence for his atrial fibrillation.  The EKG indicated that he had possibly had a heart attack in the past. ? ?The blood work was good overall and that it is stable.  His kidney function is at 28% as of that day.  That is stable. ?

## 2021-12-22 NOTE — Telephone Encounter (Signed)
WIFE REQUESTING RESULTS FROM ER ECG AND BLOODWORK FROM 4/5, HE DID NOT STAY, WAIT TOO LONG ?

## 2021-12-22 NOTE — Telephone Encounter (Signed)
Patient wife John [name] [facility]) called in wanting someone to give her a call back. She states he was in the hospital for about 16 days in march around last transmission. John [name] states a [facility] rep came to check the device while they were in there and they said they seen arrhythmias and told her they didn't understand why nobody didn't reach out to them. I let John [name] know that a nurse will give her a call back to go over   ?

## 2021-12-23 ENCOUNTER — Other Ambulatory Visit: Payer: Self-pay | Admitting: Family Medicine

## 2021-12-25 ENCOUNTER — Telehealth: Payer: Self-pay

## 2021-12-25 NOTE — Telephone Encounter (Signed)
Referred to ICM clinic by Theodoro Doing, [place] clinic RN after patient's wife called informing that patient was hospitalized in march with fluid overload.  Wife interested in [facility] clinic. ? ?Spoke with wife, Marcie Bal per DPR and ICM intro given.  She is agreeable to enroll patient in ICM monthly follow up.  Advised transmission will send automatically between 12 Midnight and 6:00 AM if monitor is by bedside.  Explained will call with results after transmission is reviewed.   Provided ICM direct number and explained should call if experiencing any fluid symptoms such as weight gain, shortness of breath or extremity/abdominal swelling.  Pt is feeling fine since hospital discharge.  He has appointments with Kidney Physician and HF clinic in May.  Pt is followed by Dr [name], Dr Percival Spanish and Dr Lovena Le.  ?1st ICM remote transmission scheduled for 01/02/2022.  ?

## 2021-12-25 NOTE — Telephone Encounter (Signed)
PATIENT'S WIFE JANET AWARE ?

## 2021-12-27 ENCOUNTER — Ambulatory Visit: Payer: PPO | Admitting: Family Medicine

## 2022-01-02 ENCOUNTER — Encounter: Payer: Self-pay | Admitting: Family Medicine

## 2022-01-02 ENCOUNTER — Ambulatory Visit (INDEPENDENT_AMBULATORY_CARE_PROVIDER_SITE_OTHER): Payer: PPO

## 2022-01-02 ENCOUNTER — Ambulatory Visit (INDEPENDENT_AMBULATORY_CARE_PROVIDER_SITE_OTHER): Payer: PPO | Admitting: Family Medicine

## 2022-01-02 ENCOUNTER — Telehealth: Payer: Self-pay | Admitting: Cardiology

## 2022-01-02 VITALS — BP 95/56 | HR 103 | Temp 96.6°F | Ht 71.0 in | Wt 189.6 lb

## 2022-01-02 DIAGNOSIS — I5022 Chronic systolic (congestive) heart failure: Secondary | ICD-10-CM

## 2022-01-02 DIAGNOSIS — R609 Edema, unspecified: Secondary | ICD-10-CM

## 2022-01-02 DIAGNOSIS — Z7901 Long term (current) use of anticoagulants: Secondary | ICD-10-CM

## 2022-01-02 DIAGNOSIS — I4819 Other persistent atrial fibrillation: Secondary | ICD-10-CM

## 2022-01-02 DIAGNOSIS — Z95810 Presence of automatic (implantable) cardiac defibrillator: Secondary | ICD-10-CM

## 2022-01-02 DIAGNOSIS — I5032 Chronic diastolic (congestive) heart failure: Secondary | ICD-10-CM | POA: Diagnosis not present

## 2022-01-02 LAB — POCT INR: INR: 2.5 (ref 2.0–3.0)

## 2022-01-02 LAB — COAGUCHEK XS/INR WAIVED
INR: 2.5 — ABNORMAL HIGH (ref 0.9–1.1)
Prothrombin Time: 29.8 s

## 2022-01-02 MED ORDER — TORSEMIDE 40 MG PO TABS: 40.0000 mg | ORAL_TABLET | Freq: Two times a day (BID) | ORAL | 2 refills | Status: DC

## 2022-01-02 NOTE — Progress Notes (Signed)
EPIC Encounter for ICM Monitoring ? ?Patient Name: John [name] is a 71 y.o. male ?Date: 01/02/2022 ?Primary Care Physican: Claretta Fraise, MD ?Primary Cardiologist: Hochrein/McLean ?Electrophysiologist: Lovena Le ?01/02/2022 Office Weight: 189 lbs  ?     ? ?1st ICM remote transmission.  Attempted call to wife, per DPR and unable to reach.  Per Epic 4/25 phone note, wife spoke with cardiology Armanda Heritage) due to pt's weight increased from 181 to 189 lbs, very SOB when walking and lower leg edema.  Instructed to take 60 mg Torsemide x 1 day and call office if symptoms persist.  Hospitalized 3/10-3/25 for volume overload (per 3/10 hospital note experienced leg edema, SOB and gradual worsening orthopnea, sleeping in chair x 2 weeks)   ?  ?CorVue thoracic impedance was suggesting possible fluid accumulation from 4/11-4/24 but returned to normal on 4/24.   Impedance suggesting dryness 3/17-4/4 dry which correlates with being diuresed during hospitalization. ? ?Prescribed:  ?Torsemide 20 mg Take 2 tablets ('40mg'$  total) by mouth every morning and 1 tablet ('20mg'$  total) by mouth every evening ? ?Labs: ?12/18/2021 Creatinine 2.05, BUN 84, Potassium 3.5, Sodium 144, GFR 35 ?12/13/2021 Creatinine 2.47, BUN 103, Potassium 3.7, Sodium 137  ?12/05/2021 Creatinine 2.45, BUN 161, Potassium 4.4, Sodium 143, GFR 28  ?12/02/2021 Creatinine 2.60, BUN 125, Potassium 3.8, Sodium 135  ?12/01/2021 Creatinine 2.85, BUN 120, Potassium 4.1, Sodium 132  ?11/30/2021 Creatinine 2.88, BUN 107, Potassium 4.3, Sodium 136  ?11/29/2021 Creatinine 2.69, BUN 100, Potassium 4.4, Sodium 132  ?11/28/2021 Creatinine 2.68, BUN 102, Potassium 3.7, Sodium 137  ?A complete set of results can be found in Results Review. ? ?Recommendations: Unable to reach.   ? ?Follow-up plan: ICM clinic phone appointment on 01/12/2022 to recheck fluid levels (before 5/8 OV).   91 day device clinic remote transmission 03/05/2022.   ? ?EP/Cardiology Office Visits: 01/15/2022 with  Dr. Percival Spanish.  01/23/2022 with HF clinic PA/NP.   ? ?Copy of ICM check sent to Dr. Lovena Le. Sent to [employer], PA due to speaking with wife on 4/25 regarding symptoms.   ? ?3 month ICM trend: 01/02/2022. ? ? ? ?12-14 Month ICM trend:  ? ? ? ?Rosalene Billings, RN ?01/02/2022 ?7:44 AM ? ?

## 2022-01-02 NOTE — Progress Notes (Signed)
? ?Subjective:  ?Patient ID: John [name], male    DOB: September 28, 1950  Age: 71 y.o. MRN: [id number] ? ?CC: Anticoagulant use ? ? ?HPI ?John [name] presents for Atrial fibrillation and artificial heart valve  follow up. Pt. is treated with rate control and anticoagulation. Pt.  denies palpitations, rapid rate, chest pain. Edema in legs persists.There has been no bleeding from nose or gums. Pt. has not noticed blood with urine or stool.  Although there is routine bruising easily, it is not excessive. ? ?He has not had CHF sx recently other than swelling when his legs are down. He is walking more with less dyspnea.  ? ?Continuing to follow eGFR due to renal insufficiency and CHF.  ? ? ? ?  01/02/2022  ?  9:19 AM 12/18/2021  ?  2:40 PM 11/13/2021  ?  9:30 AM  ?Depression screen PHQ 2/9  ?Decreased Interest 0 0 0  ?Down, Depressed, Hopeless 0 0 0  ?PHQ - 2 Score 0 0 0  ? ? 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  ? ?He has a past surgical history that includes Mitral valve replacement (11/2000); laparotomy (N/A, 02/09/2013); Portacath placement (N/A, 02/09/2013); Coronary artery bypass graft (2002); Septoplasty; Application if wound vac (2014); Colonoscopy; Esophagogastroduodenoscopy; Laparoscopic  incisional / umbilical / ventral hernia repair (03/15/2014); Port-a-cath removal (03/15/2014); Hernia repair; Cardiac catheterization (2002); Incisional hernia repair (N/A, 03/15/2014); Port-a-cath removal (Left, 03/15/2014); Insertion of mesh (N/A, 03/15/2014); RIGHT/LEFT HEART CATH AND CORONARY/GRAFT ANGIOGRAPHY (N/A, 05/10/2017); Coronary artery bypass graft; Small intestine surgery; ICD IMPLANT (N/A, 02/19/2020); RIGHT/LEFT HEART CATH AND CORONARY/GRAFT ANGIOGRAPHY (N/A, 06/23/2020); CORONARY STENT INTERVENTION (N/A, 06/24/2020); CORONARY ATHERECTOMY (N/A, 06/24/2020); CORONARY BALLOON ANGIOPLASTY (N/A, 06/24/2020); TEE without cardioversion (N/A, 11/21/2021); and RIGHT AND LEFT HEART CATH (N/A, 11/27/2021).  ? ?His family history includes Arthritis in his brother; Depression in his brother; Diabetes in his brother, brother, and mother; Heart disease in his brother and brother; Hyperlipidemia in his brother and brother; Hypertension in his brother, brother, and father; Thyroid disease in his sister.He reports that he has quit smoking. His smoking use included cigars. He has never used smokeless tobacco. He reports that he does not drink alcohol and does not use drugs. ? ? ? ?ROS ?Review of Systems  ?Constitutional:  Negative for fever.  ?Respiratory:  Negative for shortness of breath.   ?Cardiovascular:  Positive for leg swelling. Negative for chest pain.  ?Musculoskeletal:  Negative for arthralgias.  ?Skin:  Negative for rash.  ? ?Objective:  ?BP (!) 95/56   Pulse (!) 103   Temp (!) 96.6 ?F (35.9 ?C)   Ht $R'5\' 11"'do$  (1.803 m)   Wt 189 lb 9.6 oz (86 kg)   SpO2 97%   BMI 26.44 kg/m?  ? ?BP Readings from Last 3 Encounters:  ?01/02/22 (!) 95/56  ?12/18/21 (!) 100/56  ?12/13/21 99/68  ? ? ?Wt Readings from Last 3 Encounters:  ?01/02/22 189 lb 9.6 oz (86 kg)  ?12/18/21 181 lb  3.2 oz (82.2 kg)  ?12/13/21 184 lb 15.5 oz (83.9 kg)  ? ? ? ?Physical Exam ?Vitals reviewed.  ?Constitutional:   ?   Appearance: He is well-developed.   ?HENT:  ?   Head: Normocephalic and atraumatic.  ?   Right Ear: External ear normal.  ?   Left Ear: External ear normal.  ?   Mouth/Throat:  ?   Pharynx: No oropharyngeal exudate or posterior oropharyngeal erythema.  ?Eyes:  ?   Pupils: Pupils are equal, round, and reactive to light.  ?Cardiovascular:  ?   Rate and Rhythm: Normal rate and regular rhythm.  ?   Heart sounds: No murmur heard. ?Pulmonary:  ?   Effort: No respiratory distress.  ?   Breath sounds: Normal breath sounds.  ?Musculoskeletal:  ?   Cervical back: Normal range of motion and neck supple.  ?   Right lower leg: Edema (2+) present.  ?   Left lower leg: Edema (2-3+) present.  ?Neurological:  ?   Mental Status: He is alert and oriented to person, place, and time.  ? ? ? ? ?Assessment & Plan:  ? ?John [name] was seen today for anticoagulant use. ? ?Diagnoses and all orders for this visit: ? ?Persistent atrial fibrillation (John [name]) ?-     CoaguChek XS/INR Waived ?-     BMP8+EGFR ? ?Dependent edema ?-     Compression stockings ?-     BMP8+EGFR ? ?Long term (current) use of anticoagulants ? ?Chronic diastolic HF (heart failure) (John [name]) ? ?Other orders ?-     torsemide 40 MG TABS; Take 40 mg by mouth 2 (two) times daily. Take 2 tablets ($RemoveBe'40mg'HVsOSFReP$  total) by mouth every morning and 1 tablet ($RemoveB'20mg'ucklXExH$  total) by mouth every evening ? ? ? ? ?I have changed John [name]'s Torsemide. I am also having him maintain his nitroGLYCERIN, insulin glargine, sodium bicarbonate, OneTouch Delica Lancets 97I, warfarin, clopidogrel, glipiZIDE, pantoprazole, OneTouch Verio, metoprolol succinate, Januvia, and rosuvastatin. ? ? ? ? ?Follow-up: Return in about 1 month (around 02/01/2022). ? ?Claretta Fraise, M.D. ?

## 2022-01-02 NOTE — Telephone Encounter (Signed)
Pt wife called because pt is gaining wt and is more SOB. ? ?His weight went from 181 >> 189 lbs ? ?He is sleeping in the recliner, very SOB when he walks. +LE edema. ? ?Take 60 mg torsemide tonight and resume normal dose tomorrow if wt is back down. ? ?If not, call in am for instructions, he may need to come in.  ? ?She will do so. ? ?Daden Mahany, PA-C ?01/02/2022 ?6:15 PM ? ? ? ? ? ? ? ? ? ?

## 2022-01-03 ENCOUNTER — Telehealth: Payer: Self-pay

## 2022-01-03 NOTE — Progress Notes (Signed)
Received: Today ?Dena, Evelene Croon, PA-C  Tavious Griesinger Panda, RN ?Thank you  ?

## 2022-01-03 NOTE — Progress Notes (Signed)
Spoke with wife.  She reports since patient took Torsemide 60 mg yesterday, 4/25, his leg swelling is almost resolved, breathing improved and weight returned to baseline of 181 lbs.  She said is strict with limiting salt and fluid intake as recommended.  Discussed fluid symptoms and when to call the office to report changes.   ? ?Advised will send updated information to Rosaria Ferries, PA as Juluis Rainier that symptoms have improved.  Discussed remote transmission.   ? ?Will recheck fluid levels 5/5 prior to OV with Dr Percival Spanish.   ?

## 2022-01-03 NOTE — Telephone Encounter (Signed)
Remote ICM transmission received.  Attempted call to wife per DPR regarding ICM remote transmission and left message to return call.  ?

## 2022-01-10 ENCOUNTER — Telehealth: Payer: Self-pay | Admitting: Pharmacist

## 2022-01-10 NOTE — Telephone Encounter (Signed)
Lantus (vials)-refill request sent to [facility] patient assistance ?Fax#[phone] ?Patient requesting vials ?

## 2022-01-12 ENCOUNTER — Ambulatory Visit (INDEPENDENT_AMBULATORY_CARE_PROVIDER_SITE_OTHER): Payer: PPO

## 2022-01-12 ENCOUNTER — Telehealth: Payer: Self-pay

## 2022-01-12 DIAGNOSIS — I5022 Chronic systolic (congestive) heart failure: Secondary | ICD-10-CM

## 2022-01-12 DIAGNOSIS — Z95810 Presence of automatic (implantable) cardiac defibrillator: Secondary | ICD-10-CM

## 2022-01-12 NOTE — Telephone Encounter (Signed)
Remote ICM transmission received.  Attempted call to patient regarding ICM remote transmission and left detailed message per DPR.  Advised to return call for any fluid symptoms or questions. Next ICM remote transmission scheduled 02/06/2022.   ? ?

## 2022-01-12 NOTE — Progress Notes (Signed)
EPIC Encounter for ICM Monitoring ? ?Patient Name: John [name] is a 71 y.o. male ?Date: 01/12/2022 ?Primary Care Physican: Claretta Fraise, MD ?Primary Cardiologist: Hochrein/McLean ?Electrophysiologist: Lovena Le ?01/02/2022 Office Weight: 181 lbs  ?                                                         ?  ?Attempted call to wife, per DPR and unable to reach.  Left detailed message per DPR regarding transmission.  Transmission reviewed. ?  ?CorVue thoracic impedance suggesting fluid levels trending normal since 4/24 (possible fluid 4/11-4/23)  ?  ?Prescribed:  ?Torsemide 20 mg Take 2 tablets ('40mg'$  total) by mouth every morning and 1 tablet ('20mg'$  total) by mouth every evening ?  ?Labs: ?12/18/2021 Creatinine 2.05, BUN 84, Potassium 3.5, Sodium 144, GFR 35 ?12/13/2021 Creatinine 2.47, BUN 103, Potassium 3.7, Sodium 137  ?12/05/2021 Creatinine 2.45, BUN 161, Potassium 4.4, Sodium 143, GFR 28  ?12/02/2021 Creatinine 2.60, BUN 125, Potassium 3.8, Sodium 135  ?12/01/2021 Creatinine 2.85, BUN 120, Potassium 4.1, Sodium 132  ?11/30/2021 Creatinine 2.88, BUN 107, Potassium 4.3, Sodium 136  ?11/29/2021 Creatinine 2.69, BUN 100, Potassium 4.4, Sodium 132  ?11/28/2021 Creatinine 2.68, BUN 102, Potassium 3.7, Sodium 137  ?A complete set of results can be found in Results Review. ?  ?Recommendations: Unable to reach.   ?  ?Follow-up plan: ICM clinic phone appointment on 02/06/2022.   91 day device clinic remote transmission 03/05/2022.   ?  ?EP/Cardiology Office Visits: 01/15/2022 with Dr. Percival Spanish.  01/23/2022 with HF clinic PA/NP.   ?  ?Copy of ICM check sent to Dr. Lovena Le. Sent to Dr Percival Spanish since for 5/8 OV review if needed.    ? ?3 month ICM trend: 01/12/2022. ? ? ? ?12-14 Month ICM trend:  ? ? ? ?Rosalene Billings, RN ?01/12/2022 ?3:08 PM ? ?

## 2022-01-14 NOTE — Progress Notes (Signed)
?  ?Cardiology Office Note ? ? ?Date:  01/15/2022  ? ?ID:  ANGLE KAREL, DOB September 13, 1950, MRN [id number] ? ?PCP:  Claretta Fraise, MD  ?Cardiologist:   Minus Breeding, MD ? ? ?Chief Complaint  ?Patient presents with  ? Cardiomyopathy  ? ? ? ?  ?History of Present Illness: ?John [name] is a 71 y.o. male who presents for follow of MVR and atrial fib. I sent him for a Lexiscan Myoview in 2018.  He had a reduced EF with a new anterior wall perfusion defect that was not present on the previous perfusion study.  He underwent cardiac cath.  He had an occluded SVG to the RCA but flow through the native vessel.  The LAD was occluded but with good flow through the LIMA.  The patient was admitted for unstable angina in October 2021.  Cardiac catheterization performed on 06/23/2020 revealed 85% ostial left circumflex lesion, 100% mid LAD occlusion, 30% proximal RCA lesion, 80% proximal to distal RCA lesion, 100% occlusion in SVG to distal RCA, patent LIMA to LAD, 70% proximal to mid LAD lesion.  The left circumflex lesion was treated with a Resolute Onyx 2.75 x 26 mm DES on the following day with staged PCI.  Postprocedure, he was started on aspirin and Plavix.  Imdur was discontinued due to hypotension.  He was also started on Entresto.  His Imdur has been held and restarted with caution secondary to hypotension.  [name] has been held and then restarted at a lower dose secondary to CKD.  His last echocardiogram in April 2021 demonstrated the EF to be 25 to 30%.  He had mechanical mitral valve that appears to be normally functioning.  There was mild aortic stenosis.  ?At the last appt I admitted him because of progressive HF.   ? ?He had a complicated hospital stay.  EF was 20 - 25%  TEE 3/14 showed EF around 25% but only mildly decreased RV function and trivial TR, normally functioning mechanical MV and abnormal aortic valve with moderate-severe AS and AI. He has diuresed well on milrinone 0.25 + Lasix/metolazone.  He  diuresed well with UOP net -22.4L this admission with weight 188lbs at discharge.  With RV dysfunction, not sure we can get much better than this. He was transitioned from IV lasix to torsemide.    He had atrial fib with good rate control.    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  There was moderate-severe AI as well.  Hemodynamic right/left heart cath suggested only mild aortic stenosis. He was not thought to be a TAVR candidate at this time.    He also had CKD.   ? ?He has been very going home.  However, he does go out and take short and walk around the store.  He walks 100 feet to the mailbox.  He is still short of breath and he is fatigued but not as badly as he was.  He still sleeping in a chair.  He did have a day where his weight went from 1 81-1 90 ?He called our practice.  We increased his torsemide for a few days and his weight went down.  He does have continued lower extremity swelling and is started wearing compression stockings.  He has follow-up with nephrology. ? ? ?Past Medical History:  ?Diagnosis Date  ? Abnormal nuclear stress test 05/10/2017  ?  Allergy   ? Anemia   ? "lost blood w/the cancer"  ? Arthritis   ? back   ? Atrial fibrillation ([place])   ? takes Coumadin daily  ? Bruises easily   ? takes Coumadin daily  ? CAD (coronary artery disease)   ? a. s/p CABG 11/2000 (L-LAD, S-RCA);  b. Lexiscan Myoview 11/12: EF 45%, ischemia and possibly some scar at the base of the inferolateral wall;   c. Lex MV 11/13:  EF 44%, Inf and IL scar/soft tissue atten, small mild amt of inf ischemia,  ? CHF (congestive heart failure) ([place])   ? Constipation   ? takes Colace daily  ? 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 Enlarged prostate   ? Flu 09/2013  ? GERD (gastroesophageal reflux disease)   ? takes Nexium daily  ? Glaucoma   ? History of blood transfusion   ? "related to cancer"   ? History of colon polyps   ? HLD (hyperlipidemia)   ? takes Vytorin daily  ? HTN (hypertension)   ? takes Amlodipine,Metoprolol, and Ramipril daily  ? Insomnia   ? states he has always been like this.But doesn't take any meds  ? Kidney stones   ? "I passed them all"  ? Nocturia   ? Peripheral edema   ? takes Furosemide daily  ? Peripheral neuropathy   ? Rheumatic fever ~ 1965  ? Rheumatic heart disease   ? a. s/p mechanical (St. Jude) MVR 2002;  b. Echo 5/11: Mild LVH, EF 45-50%, mild AS, mild AI, mean gradient 11 mmHg, MVR with normal gradients, moderate LAE, mild RAE;  c.  Echo 11/13:   mod LVH, EF 55-60%, mild AS (mean 18 mmHg), mild AI, severe LAE, mild RAE, PASP 41  ? 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 SOB (shortness of breath)   ? with exertion  ? Type II diabetes mellitus ([place])   ? takes Januvia,Glipizide,and Metformin daily as well as Lantus  ? Unstable angina ([place]) 06/21/2020  ? ? ?Past Surgical History:  ?Procedure Laterality Date  ? APPLICATION OF WOUND VAC  2014  ? CARDIAC CATHETERIZATION  2002  ? COLONOSCOPY    ? CORONARY ARTERY BYPASS GRAFT  2002  ? x 3  ? CORONARY ARTERY BYPASS GRAFT    ? CORONARY ATHERECTOMY N/A 06/24/2020  ? Procedure: CORONARY ATHERECTOMY;  Surgeon: [name], MD;  Location: [place] [facility];  Service: Cardiovascular;  Laterality: N/A;  ? CORONARY BALLOON ANGIOPLASTY N/A 06/24/2020  ? Procedure: CORONARY BALLOON ANGIOPLASTY;  Surgeon: [name], MD;  Location: [place] [facility];  Service: Cardiovascular;  Laterality: N/A;  ? CORONARY STENT INTERVENTION N/A 06/24/2020  ? Procedure: CORONARY STENT INTERVENTION;  Surgeon: [name], MD;  Location: [place] [facility];  Service: Cardiovascular;  Laterality: N/A;  ? ESOPHAGOGASTRODUODENOSCOPY    ? HERNIA REPAIR    ? ICD IMPLANT N/A 02/19/2020  ? Procedure: ICD IMPLANT;  Surgeon: Evans Lance, MD;  Location: [facility] [facility];  Service: Cardiovascular;  Laterality:  N/A;  ? INCISIONAL HERNIA REPAIR N/A 03/15/2014  ? Procedure: LAPAROSCOPIC INCISIONAL HERNIA;  Surgeon: Harl Bowie, MD;  Location: [facility];  Service: General;  Laterality: N/A;  ? INSERTION OF MESH N/A 03/15/2014  ? Procedure: INSERTION OF MESH;  Surgeon: Harl Bowie, MD;  Location: [place];  Service: General;  Laterality: N/A;  ? LAPAROSCOPIC INCISIONAL / UMBILICAL / [place]  03/15/2014  ? IHR  ? LAPAROTOMY N/A 02/09/2013  ? Procedure: EXPLORATORY LAPAROTOMY WITH incisional biopsy intra- ABDOMINAL MASS ILOCECTOMY ;  Surgeon: Harl Bowie, MD;  Location: [facility];  Service: General;  Laterality: N/A;  ? MITRAL VALVE REPLACEMENT  11/2000  ? #33 St. Jude mechanical mitral valve prosthesis. Dr. Servando Snare  ? PORT-A-CATH REMOVAL  03/15/2014  ? PORT-A-CATH REMOVAL Left 03/15/2014  ? Procedure: REMOVAL PORT-A-CATH;  Surgeon: Harl Bowie, MD;  Location: [facility];  Service: General;  Laterality: Left;  ? PORTACATH PLACEMENT N/A 02/09/2013  ? Procedure: INSERTION PORT-A-CATH;  Surgeon: Harl Bowie, MD;  Location: [facility];  Service: General;  Laterality: N/A;  ? RIGHT AND LEFT HEART CATH N/A 11/27/2021  ? Procedure: RIGHT AND LEFT HEART CATH;  Surgeon: Larey Dresser, MD;  Location: [facility];  Service: Cardiovascular;  Laterality: N/A;  ? RIGHT/LEFT HEART CATH AND CORONARY/GRAFT ANGIOGRAPHY N/A 05/10/2017  ? Procedure: RIGHT/LEFT HEART CATH AND CORONARY/GRAFT ANGIOGRAPHY;  Surgeon: Martinique, Peter M, MD;  Location: [facility];  Service: Cardiovascular;  Laterality: N/A;  ? RIGHT/LEFT HEART CATH AND CORONARY/GRAFT ANGIOGRAPHY N/A 06/23/2020  ? Procedure: RIGHT/LEFT HEART CATH AND CORONARY/GRAFT ANGIOGRAPHY;  Surgeon: Troy Sine, MD;  Location: [place];  Service: Cardiovascular;  Laterality: N/A;  ? SEPTOPLASTY    ? SMALL INTESTINE SURGERY    ? TEE WITHOUT CARDIOVERSION N/A 11/21/2021  ? Procedure: TRANSESOPHAGEAL ECHOCARDIOGRAM (TEE);  Surgeon: Larey Dresser, MD;   Location: [facility];  Service: Cardiovascular;  Laterality: N/A;  ? ? ? ?Current Outpatient Medications  ?Medication Sig Dispense Refill  ? clopidogrel (PLAVIX) 75 MG tablet TAKE 1 TABLET BY MOUTH DAILY WIT

## 2022-01-15 ENCOUNTER — Encounter: Payer: Self-pay | Admitting: Cardiology

## 2022-01-15 ENCOUNTER — Ambulatory Visit: Payer: HMO | Admitting: Cardiology

## 2022-01-15 VITALS — BP 112/58 | HR 96 | Ht 71.0 in | Wt 190.2 lb

## 2022-01-15 DIAGNOSIS — Z9889 Other specified postprocedural states: Secondary | ICD-10-CM | POA: Diagnosis not present

## 2022-01-15 DIAGNOSIS — N179 Acute kidney failure, unspecified: Secondary | ICD-10-CM | POA: Diagnosis not present

## 2022-01-15 DIAGNOSIS — I5041 Acute combined systolic (congestive) and diastolic (congestive) heart failure: Secondary | ICD-10-CM

## 2022-01-15 DIAGNOSIS — I25118 Atherosclerotic heart disease of native coronary artery with other forms of angina pectoris: Secondary | ICD-10-CM

## 2022-01-15 MED ORDER — METOPROLOL SUCCINATE ER 25 MG PO TB24: 25.0000 mg | ORAL_TABLET | Freq: Every day | ORAL | 3 refills | Status: DC

## 2022-01-15 MED ORDER — NITROGLYCERIN 0.4 MG SL SUBL: 0.4000 mg | SUBLINGUAL_TABLET | SUBLINGUAL | 10 refills | Status: DC | PRN

## 2022-01-15 NOTE — Patient Instructions (Addendum)
Medication Instructions: ?INCREASE Metoprolol to 25 mg daily  ?*If you need a refill on your cardiac medications before your next appointment, please call your pharmacy* ? ?Lab Work: ?NONE ordered at this time of appointment  ? ?If you have labs (blood work) drawn today and your tests are completely normal, you will receive your results only by: ?MyChart Message (if you have MyChart) OR ?A paper copy in the mail ?If you have any lab test that is abnormal or we need to change your treatment, we will call you to review the results. ? ?Testing/Procedures: ?NONE ordered at this time of appointment  ? ?Follow-Up: ?At [facility], you and your health needs are our priority.  As part of our continuing mission to provide you with exceptional heart care, we have created designated Provider Care Teams.  These Care Teams include your primary Cardiologist (physician) and Advanced Practice Providers (APPs -  Physician Assistants and Nurse Practitioners) who all work together to provide you with the care you need, when you need it. ? ?Your next appointment:   ?1 month(s) ? ?The format for your next appointment:   ?In Person ? ?Provider:   ?Minus Breeding, MD   ? ? ?Other Instructions ? ? ?Important Information About Sugar ? ? ? ? ? ? ?

## 2022-01-17 DIAGNOSIS — R809 Proteinuria, unspecified: Secondary | ICD-10-CM | POA: Diagnosis not present

## 2022-01-17 DIAGNOSIS — D631 Anemia in chronic kidney disease: Secondary | ICD-10-CM | POA: Diagnosis not present

## 2022-01-17 DIAGNOSIS — I5020 Unspecified systolic (congestive) heart failure: Secondary | ICD-10-CM | POA: Diagnosis not present

## 2022-01-17 DIAGNOSIS — I129 Hypertensive chronic kidney disease with stage 1 through stage 4 chronic kidney disease, or unspecified chronic kidney disease: Secondary | ICD-10-CM | POA: Diagnosis not present

## 2022-01-17 DIAGNOSIS — N2581 Secondary hyperparathyroidism of renal origin: Secondary | ICD-10-CM | POA: Diagnosis not present

## 2022-01-17 DIAGNOSIS — N179 Acute kidney failure, unspecified: Secondary | ICD-10-CM | POA: Diagnosis not present

## 2022-01-17 DIAGNOSIS — N183 Chronic kidney disease, stage 3 unspecified: Secondary | ICD-10-CM | POA: Diagnosis not present

## 2022-01-19 ENCOUNTER — Telehealth: Payer: Self-pay | Admitting: Cardiology

## 2022-01-19 ENCOUNTER — Other Ambulatory Visit: Payer: Self-pay | Admitting: Family Medicine

## 2022-01-19 DIAGNOSIS — I25118 Atherosclerotic heart disease of native coronary artery with other forms of angina pectoris: Secondary | ICD-10-CM

## 2022-01-19 DIAGNOSIS — I10 Essential (primary) hypertension: Secondary | ICD-10-CM

## 2022-01-19 DIAGNOSIS — I255 Ischemic cardiomyopathy: Secondary | ICD-10-CM

## 2022-01-19 NOTE — Telephone Encounter (Signed)
Spoke to pharmacist at [facility] advised Dr.Hochrein increased Metoprolol to 25 mg daily on 01/15/22. ?

## 2022-01-19 NOTE — Telephone Encounter (Signed)
Pt c/o medication issue: ? ?1. Name of Medication:  ?metoprolol succinate (TOPROL-XL) 25 MG 24 hr tablet ? ?2. How are you currently taking this medication (dosage and times per day)?  ? ? ?3. Are you having a reaction (difficulty breathing--STAT)?  ? ?4. What is your medication issue?  ? ?[place] with [facility] states the patient's insurance is denying Metoprolol 25 MG until 6/01 because the patient should have enough of the old 12.5 MG Rx to last him until then. However, per [facility], the patient's wife states the patient will be out of medication on 5/28. [place] states the insurance needs an exact date of when it was increased from 12.5 MG to 25 MG. ? ?

## 2022-01-22 NOTE — Progress Notes (Signed)
? ?ADVANCED HF CLINIC NOTE ? ? ?Primary Care: Claretta Fraise, MD ?Cardiology: Dr. Percival Spanish ?HF Cardiologist: Dr. [name] ? ?HPI: ?John [name] is a 71 y.o. with a history of CAD, S/P CABG 2002, GERD, HLD, HTN,  Mechanical MVR 2002, rheumatic fever, DMII, chronic A fib, St Jude ICD, CKD Stage IIIb, and HFrEF. ?  ?Admitted 3/23 from Dr. Rosezella [place] office with a/c CHF exacerbation.  Echo showed EF 20-25%, RV severely HK, mechanical MV with mean gradient 6, mod-severe TR, moderate AI, ?low flow/low gradient moderate-severe AS with mean gradient 15/AVA 0.96 cm^2.  TEE  showed EF around 25% but only mildly decreased RV function and trivial TR, normally functioning mechanical MV and abnormal aortic valve with moderate-severe AS and AI. Diuresed with IV lasix, augmented with milrinone. 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 Discussed with Structural Heart Team and not felt to be a good TAVR candidate. Milrinone weaned off and GDMT resumed, but limited by CKD. Discharged home, weight 188 lbs. ? ?Post hospital follow up 4/23, feeling good. Stable NYHA II symptoms and volume controlled. Follow up with Dr. Percival Spanish last week ? ?Today he returns for HF follow up with his wife. Overall feeling  poorly. Saw Dr. Percival Spanish last week and was doing OK, had taken a few extra torsemide for swelling and symptoms improved. His beta blocker increased. Now, he is more SOB, has worsening LE edema with weeping. Wife says he sleeps a lot, does not move around much and is having memory issues. Denies palpitations, CP, dizziness. He has to sleep in a recliner. Appetite poor. No fever or chills. Taking all medications. He and his wife are asking to be admitted. ? ?ECG (personally reviewed): atrial fibrillation 86 bpm ? ?Device interrogation (personally reviewed): CorVue stable but appears to down-turning, <1 % v-pacing ? ?Labs  (3/23): K 4.4, creatinine 2.45 ?Labs (4/23): K 3.5, creatinine 2.05 ? ?Cardiac Testing  ?- Echo (3/23): EF 20-25%, RV severely HK, mechanical MV with mean gradient 6, mod-severe TR, moderate AI, ?low flow/low gradient moderate-severe AS with mean gradient 15/AVA 0.96 cm^2. ? ?- TEE (3/23): EF around 25% but only mildly decreased RV function and trivial TR, normally functioning mechanical MV and abnormal aortic valve with moderate-severe AS and AI. ? ?- R/LHC (3/23): ?Right and left heart filling pressures remain elevated.  ?2. 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  ?3. Preserved cardiac output.  ?4. Mild aortic stenosis by cath.  ? ?RA mean 13 ?RV 67/14 ?PA 65/33, mean 46 ?PCWP mean 22 ?LV 117/25 ?AO 105/52 ? ?Oxygen saturations: ?PA 59% ?AO 97% ? ?Cardiac Output (Fick) 4.87  ?Cardiac Index (Fick) 2.3 ?PVR 4.9 WU ? ?Aortic valve mean gradient 13.6 mmHg, AVA 1.61 cm^2 ?  ? ?- Echo (2021): EF 25-30%  ?- Echo (2019): EF 30-35%  ?  ?- RHC/LHC (10/21): ?Ost Cx lesion is 85% stenosed. ?Mid LAD lesion is 100% stenosed. ?Prox RCA lesion is 30% stenosed. ?Prox RCA to Dist RCA lesion is 80% stenosed. ?Origin lesion is 100% stenosed. ?Prox LAD to Mid LAD lesion is 70% stenosed. ?Successful PCI using orbital atherectomy and drug-eluting stent placement in the distal LMCA into the proximal LCx using a Resolute Onyx 2.75 x 26 mm drug-eluting stent.  Plaque shift into the ramus intermedius necessitated kissing balloon inflation.  Final angiogram demonstrates 0% residual stenosis with TIMI-3 flow. ? ?Past Medical History:  ?Diagnosis Date  ? Abnormal nuclear stress test 05/10/2017  ?  Allergy   ? Anemia   ? "lost blood w/the cancer"  ? Arthritis   ? back   ? Atrial fibrillation ([place])   ? takes Coumadin daily  ? Bruises easily   ? takes Coumadin daily  ? CAD (coronary artery disease)   ? a. s/p CABG 11/2000 (L-LAD, S-RCA);  b. Lexiscan Myoview 11/12: EF 45%, ischemia and possibly some scar at the base of the  inferolateral wall;   c. Lex MV 11/13:  EF 44%, Inf and IL scar/soft tissue atten, small mild amt of inf ischemia,  ? CHF (congestive heart failure) ([place])   ? Constipation   ? takes Colace daily  ? 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 Enlarged prostate   ? Flu 09/2013  ? GERD (gastroesophageal reflux disease)   ? takes Nexium daily  ? Glaucoma   ? History of blood transfusion   ? "related to cancer"  ? History of colon polyps   ? HLD (hyperlipidemia)   ? takes Vytorin daily  ? HTN (hypertension)   ? takes Amlodipine,Metoprolol, and Ramipril daily  ? Insomnia   ? states he has always been like this.But doesn't take any meds  ? Kidney stones   ? "I passed them all"  ? Nocturia   ? Peripheral edema   ? takes Furosemide daily  ? Peripheral neuropathy   ? Rheumatic fever ~ 1965  ? Rheumatic heart disease   ? a. s/p mechanical (St. Jude) MVR 2002;  b. Echo 5/11: Mild LVH, EF 45-50%, mild AS, mild AI, mean gradient 11 mmHg, MVR with normal gradients, moderate LAE, mild RAE;  c.  Echo 11/13:   mod LVH, EF 55-60%, mild AS (mean 18 mmHg), mild AI, severe LAE, mild RAE, PASP 41  ? 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 SOB (shortness of breath)   ? with exertion  ? Type II diabetes mellitus ([place])   ? takes Januvia,Glipizide,and Metformin daily as well as Lantus  ? Unstable angina ([place]) 06/21/2020  ? ?Current Outpatient Medications  ?Medication Sig Dispense Refill  ? clopidogrel (PLAVIX) 75 MG tablet TAKE 1 TABLET BY MOUTH DAILY WITH BREAKFAST. 90 tablet 3  ? glipiZIDE (GLUCOTROL XL) 10 MG 24 hr tablet Take 1 tablet (10 mg total) by mouth daily. 90 tablet 3  ? insulin glargine (LANTUS) 100 UNIT/ML injection Inject 0.7 mLs (70 Units total) into the skin at bedtime. (Patient taking differently: Inject 70 Units into the skin. Take 70 units if over 150 daily) 10 mL 11  ? JANUVIA 50 MG tablet Take 0.5 tablets (25 mg total) by mouth daily. 90 tablet 2  ? metoprolol succinate (TOPROL-XL)  25 MG 24 hr tablet Take 1 tablet (25 mg total) by mouth daily. 90 tablet 3  ? nitroGLYCERIN (NITROSTAT) 0.4 MG SL tablet Place 1 tablet (0.4 mg total) under the tongue every 5 (five) minutes as needed for chest pain. 50 tablet 10  ? OneTouch Delica Lancets 04U MISC TEST 2 TIMES DAILY AS DIRECTED 100 each 5  ? ONETOUCH VERIO test strip USE TO TEST TWICE A DAY 200 strip 3  ? pantoprazole (PROTONIX) 40 MG tablet Take 1 tablet (40 mg total) by mouth daily. 90 tablet 3  ? rosuvastatin (CRESTOR) 20 MG tablet 20 mg. Patient takes 1 tablet by mouth every other day.    ? sodium bicarbonate 650 MG tablet Take 650 mg by mouth 2 (two) times daily.    ?  torsemide (DEMADEX) 20 MG tablet Patient takes 60 mg by mouth in the morning and 60 mg in the evening.    ? warfarin (COUMADIN) 5 MG tablet Take 1 tablet (5 mg total) by mouth daily. (Patient taking differently: Take 5 mg by mouth daily. Alternates 2.5 x2 days then '5mg'$  alternating) 90 tablet 3  ? ?No current facility-administered medications for this encounter.  ? ?Allergies  ?Allergen Reactions  ? Allopurinol Shortness Of Breath  ? Doxycycline Hives  ? Wilder Glade [Dapagliflozin]   ?  Can not tolerate - dizzy weak almost passed out  ? ?Social History  ? ?Socioeconomic History  ? Marital status: Married  ?  Spouse name: Marcie Bal  ? Number of children: 1  ? Years of education: 14  ? Highest education level: High school graduate  ?Occupational History  ? Occupation: disabled  ?Tobacco Use  ? Smoking status: Former  ?  Types: Cigars  ? Smokeless tobacco: Never  ?Vaping Use  ? Vaping Use: Never used  ?Substance and Sexual Activity  ? Alcohol use: No  ?  Comment: "drank for 30 years; quit ~ 2000"  ? Drug use: No  ?  Types: Marijuana  ?  Comment: quit in 2000 'reefer'  ? Sexual activity: Yes  ?Other Topics Concern  ? Not on file  ?Social History Narrative  ? 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  ? ?Social Determinants of Health  ? ?Financial Resource Strain: Not on file  ?Food  Insecurity: Not on file  ?Transportation Needs: Not on file  ?Physical Activity: Not on file  ?Stress: Not on file  ?Social Connections: Not on file  ?Intimate Partner Violence: Not on file  ? ?Family History  ?P

## 2022-01-23 ENCOUNTER — Telehealth: Payer: Self-pay | Admitting: Family Medicine

## 2022-01-23 ENCOUNTER — Encounter (HOSPITAL_COMMUNITY): Payer: Self-pay

## 2022-01-23 ENCOUNTER — Encounter: Payer: Self-pay | Admitting: Internal Medicine

## 2022-01-23 ENCOUNTER — Ambulatory Visit (HOSPITAL_COMMUNITY)
Admission: RE | Admit: 2022-01-23 | Discharge: 2022-01-23 | Disposition: A | Discharge status: Home / Self Care | Payer: PPO | Source: Ambulatory Visit | Attending: Family Medicine | Admitting: Family Medicine

## 2022-01-23 VITALS — BP 112/68 | HR 89 | Wt 190.4 lb

## 2022-01-23 DIAGNOSIS — D509 Iron deficiency anemia, unspecified: Secondary | ICD-10-CM

## 2022-01-23 DIAGNOSIS — E785 Hyperlipidemia, unspecified: Secondary | ICD-10-CM | POA: Diagnosis not present

## 2022-01-23 DIAGNOSIS — I4821 Permanent atrial fibrillation: Secondary | ICD-10-CM | POA: Insufficient documentation

## 2022-01-23 DIAGNOSIS — I082 Rheumatic disorders of both aortic and tricuspid valves: Secondary | ICD-10-CM | POA: Insufficient documentation

## 2022-01-23 DIAGNOSIS — I130 Hypertensive heart and chronic kidney disease with heart failure and stage 1 through stage 4 chronic kidney disease, or unspecified chronic kidney disease: Secondary | ICD-10-CM | POA: Diagnosis not present

## 2022-01-23 DIAGNOSIS — I4819 Other persistent atrial fibrillation: Secondary | ICD-10-CM

## 2022-01-23 DIAGNOSIS — I5022 Chronic systolic (congestive) heart failure: Secondary | ICD-10-CM | POA: Diagnosis not present

## 2022-01-23 DIAGNOSIS — Z7901 Long term (current) use of anticoagulants: Secondary | ICD-10-CM | POA: Insufficient documentation

## 2022-01-23 DIAGNOSIS — E1142 Type 2 diabetes mellitus with diabetic polyneuropathy: Secondary | ICD-10-CM | POA: Diagnosis not present

## 2022-01-23 DIAGNOSIS — Z952 Presence of prosthetic heart valve: Secondary | ICD-10-CM | POA: Diagnosis not present

## 2022-01-23 DIAGNOSIS — N1832 Chronic kidney disease, stage 3b: Secondary | ICD-10-CM | POA: Insufficient documentation

## 2022-01-23 DIAGNOSIS — N183 Chronic kidney disease, stage 3 unspecified: Secondary | ICD-10-CM

## 2022-01-23 DIAGNOSIS — I2510 Atherosclerotic heart disease of native coronary artery without angina pectoris: Secondary | ICD-10-CM | POA: Diagnosis not present

## 2022-01-23 DIAGNOSIS — I5023 Acute on chronic systolic (congestive) heart failure: Secondary | ICD-10-CM | POA: Diagnosis not present

## 2022-01-23 DIAGNOSIS — N179 Acute kidney failure, unspecified: Secondary | ICD-10-CM | POA: Diagnosis not present

## 2022-01-23 DIAGNOSIS — R7401 Elevation of levels of liver transaminase levels: Secondary | ICD-10-CM

## 2022-01-23 DIAGNOSIS — Z7902 Long term (current) use of antithrombotics/antiplatelets: Secondary | ICD-10-CM | POA: Insufficient documentation

## 2022-01-23 DIAGNOSIS — I359 Nonrheumatic aortic valve disorder, unspecified: Secondary | ICD-10-CM | POA: Diagnosis not present

## 2022-01-23 DIAGNOSIS — Z79899 Other long term (current) drug therapy: Secondary | ICD-10-CM | POA: Diagnosis not present

## 2022-01-23 DIAGNOSIS — I25118 Atherosclerotic heart disease of native coronary artery with other forms of angina pectoris: Secondary | ICD-10-CM | POA: Diagnosis not present

## 2022-01-23 DIAGNOSIS — E1122 Type 2 diabetes mellitus with diabetic chronic kidney disease: Secondary | ICD-10-CM

## 2022-01-23 DIAGNOSIS — I25810 Atherosclerosis of coronary artery bypass graft(s) without angina pectoris: Secondary | ICD-10-CM | POA: Diagnosis not present

## 2022-01-23 DIAGNOSIS — I519 Heart disease, unspecified: Secondary | ICD-10-CM

## 2022-01-23 DIAGNOSIS — K219 Gastro-esophageal reflux disease without esophagitis: Secondary | ICD-10-CM | POA: Insufficient documentation

## 2022-01-23 DIAGNOSIS — Z9889 Other specified postprocedural states: Secondary | ICD-10-CM

## 2022-01-23 LAB — CBC
HCT: 42.3 % (ref 39.0–52.0)
Hemoglobin: 13.4 g/dL (ref 13.0–17.0)
MCH: 28.6 pg (ref 26.0–34.0)
MCHC: 31.7 g/dL (ref 30.0–36.0)
MCV: 90.2 fL (ref 80.0–100.0)
Platelets: 188 10*3/uL (ref 150–400)
RBC: 4.69 MIL/uL (ref 4.22–5.81)
RDW: 21.2 % — ABNORMAL HIGH (ref 11.5–15.5)
WBC: 6.5 10*3/uL (ref 4.0–10.5)
nRBC: 0 % (ref 0.0–0.2)

## 2022-01-23 LAB — COMPREHENSIVE METABOLIC PANEL
ALT: 14 U/L (ref 0–44)
AST: 23 U/L (ref 15–41)
Albumin: 3 g/dL — ABNORMAL LOW (ref 3.5–5.0)
Alkaline Phosphatase: 215 U/L — ABNORMAL HIGH (ref 38–126)
Anion gap: 11 (ref 5–15)
BUN: 122 mg/dL — ABNORMAL HIGH (ref 8–23)
CO2: 27 mmol/L (ref 22–32)
Calcium: 9.3 mg/dL (ref 8.9–10.3)
Chloride: 99 mmol/L (ref 98–111)
Creatinine, Ser: 2.4 mg/dL — ABNORMAL HIGH (ref 0.61–1.24)
GFR, Estimated: 29 mL/min — ABNORMAL LOW (ref >60)
Glucose, Bld: 239 mg/dL — ABNORMAL HIGH (ref 70–99)
Potassium: 4.4 mmol/L (ref 3.5–5.1)
Sodium: 137 mmol/L (ref 135–145)
Total Bilirubin: 2.1 mg/dL — ABNORMAL HIGH (ref 0.3–1.2)
Total Protein: 7.2 g/dL (ref 6.5–8.1)

## 2022-01-23 LAB — BRAIN NATRIURETIC PEPTIDE: B Natriuretic Peptide: >4500 pg/mL — ABNORMAL HIGH (ref 0.0–100.0)

## 2022-01-23 MED ORDER — CEPHALEXIN 500 MG PO CAPS: 500.0000 mg | ORAL_CAPSULE | Freq: Four times a day (QID) | ORAL | 0 refills | Status: DC

## 2022-01-23 NOTE — Telephone Encounter (Signed)
Left message for patient to call back and schedule Medicare Annual Wellness Visit (AWV) to be completed by video or phone.  ? ?Last AWV: 12/27/2020 ? ?Please schedule at anytime with [place] ? ?45 minute appointment ? ?Any questions, please contact me at [phone]  ?  ?  ?

## 2022-01-23 NOTE — Patient Instructions (Signed)
Thank you for coming in today ? ?Labs were done today, if any labs are abnormal the clinic will call you ?No news is good news ? ?START subcutaneus Furoscix daily for 3 days with 40 meq of potassium  ?Hold Furosemide dose these 3 days  ? ?START Keflex 500 mg 1 capsule 4 times a day for 10 days   ? ?You have been referred to the wound center and they will contact you for further appointment details  ? ?Your physician recommends that you schedule a follow-up appointment in:  ?1 week in clinic  ?2 months with Dr. [name] ? ?At the [facility], you and your health needs are our priority. As part of our continuing mission to provide you with exceptional heart care, we have created designated Provider Care Teams. These Care Teams include your primary Cardiologist (physician) and Advanced Practice Providers (APPs- Physician Assistants and Nurse Practitioners) who all work together to provide you with the care you need, when you need it.  ? ?You may see any of the following providers on your designated Care Team at your next follow up: ?Dr Glori Bickers ?Dr Loralie Champagne ?Darrick Grinder, NP ?Lyda Jester, PA ?Jessica Milford,NP ?Marlyce Huge, PA ?Audry Riles, PharmD ? ? ?Please be sure to bring in all your medications bottles to every appointment.  ? ?If you have any questions or concerns before your next appointment please send [place] a message through [place] or call our office at [phone].   ? ?TO LEAVE A MESSAGE FOR THE NURSE SELECT OPTION 2, PLEASE LEAVE A MESSAGE INCLUDING: ?YOUR NAME ?DATE OF BIRTH ?CALL BACK NUMBER ?REASON FOR CALL**this is important as we prioritize the call backs ? ?YOU WILL RECEIVE A CALL BACK THE SAME DAY AS LONG AS YOU CALL BEFORE 4:00 PM ? ?

## 2022-01-24 ENCOUNTER — Telehealth (HOSPITAL_COMMUNITY): Payer: Self-pay | Admitting: *Deleted

## 2022-01-24 MED ORDER — POTASSIUM CHLORIDE CRYS ER 20 MEQ PO TBCR: EXTENDED_RELEASE_TABLET | ORAL | 0 refills | Status: DC

## 2022-01-24 MED ORDER — METOLAZONE 2.5 MG PO TABS: ORAL_TABLET | ORAL | 0 refills | Status: DC

## 2022-01-24 NOTE — Telephone Encounter (Signed)
Pts wife called statin furoscix requires a prior auth and even if it gets approved they won't deliver it until tomorrow.  She asked what dose of lasix to give him today.  ? ?Routed to [facility] for advice ?

## 2022-01-24 NOTE — Telephone Encounter (Signed)
Pt wife aware

## 2022-01-24 NOTE — Telephone Encounter (Signed)
Left vm for pts wife to return my call  ?

## 2022-01-30 NOTE — Progress Notes (Signed)
ADVANCED HF CLINIC NOTE   Primary Care: Claretta Fraise, MD Cardiology: Dr. Percival Spanish HF Cardiologist: Dr. [name]  HPI: Mr John [name] is a 71 y.o. with a history of CAD, S/P CABG 2002, GERD, HLD, HTN,  Mechanical MVR 2002, rheumatic fever, DMII, chronic A fib, St Jude ICD, CKD Stage IIIb, and HFrEF.   Admitted 3/23 from Dr. Rosezella [place] office with a/c CHF exacerbation.  Echo showed EF 20-25%, RV severely HK, mechanical MV with mean gradient 6, mod-severe TR, moderate AI, ?low flow/low gradient moderate-severe AS with mean gradient 15/AVA 0.96 cm^2.  TEE  showed EF around 25% but only mildly decreased RV function and trivial TR, normally functioning mechanical MV and abnormal aortic valve with moderate-severe AS and AI. Diuresed with IV lasix, augmented with milrinone. 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 Discussed with Structural Heart Team and not felt to be a good TAVR candidate. Milrinone weaned off and GDMT resumed, but limited by CKD. Discharged home, weight 188 lbs.  Post hospital follow up 4/23, feeling good. Stable NYHA II symptoms and volume controlled. Follow up with Dr. Percival Spanish last week  Today he returns for HF follow up with his wife. Overall feeling  poorly. Saw Dr. Percival Spanish last week and was doing OK, had taken a few extra torsemide for swelling and symptoms improved. His beta blocker increased. Now, he is more SOB, has worsening LE edema with weeping. Wife says he sleeps a lot, does not move around much and is having memory issues. Denies palpitations, CP, dizziness. He has to sleep in a recliner. Appetite poor. No fever or chills. Taking all medications. He and his wife are asking to be admitted.  ECG (personally reviewed): atrial fibrillation 86 bpm  Device interrogation (personally reviewed): CorVue stable but appears to down-turning, <1 % v-pacing  Labs  (3/23): K 4.4, creatinine 2.45 Labs (4/23): K 3.5, creatinine 2.05  Cardiac Testing  - Echo (3/23): EF 20-25%, RV severely HK, mechanical MV with mean gradient 6, mod-severe TR, moderate AI, ?low flow/low gradient moderate-severe AS with mean gradient 15/AVA 0.96 cm^2.  - TEE (3/23): EF around 25% but only mildly decreased RV function and trivial TR, normally functioning mechanical MV and abnormal aortic valve with moderate-severe AS and AI.  - R/LHC (3/23): Right and left heart filling pressures remain elevated.  2. 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  3. Preserved cardiac output.  4. Mild aortic stenosis by cath.   RA mean 13 RV 67/14 PA 65/33, mean 46 PCWP mean 22 LV 117/25 AO 105/52  Oxygen saturations: PA 59% AO 97%  Cardiac Output (Fick) 4.87  Cardiac Index (Fick) 2.3 PVR 4.9 WU  Aortic valve mean gradient 13.6 mmHg, AVA 1.61 cm^2    - Echo (2021): EF 25-30%  - Echo (2019): EF 30-35%    - RHC/LHC (10/21): Ost Cx lesion is 85% stenosed. Mid LAD lesion is 100% stenosed. Prox RCA lesion is 30% stenosed. Prox RCA to Dist RCA lesion is 80% stenosed. Origin lesion is 100% stenosed. Prox LAD to Mid LAD lesion is 70% stenosed. Successful PCI using orbital atherectomy and drug-eluting stent placement in the distal LMCA into the proximal LCx using a Resolute Onyx 2.75 x 26 mm drug-eluting stent.  Plaque shift into the ramus intermedius necessitated kissing balloon inflation.  Final angiogram demonstrates 0% residual stenosis with TIMI-3 flow.  Past Medical History:  Diagnosis Date   Abnormal nuclear stress test 05/10/2017  Allergy    Anemia    "lost blood w/the cancer"   Arthritis    back    Atrial fibrillation ([facility])    takes Coumadin daily   Bruises easily    takes Coumadin daily   CAD (coronary artery disease)    a. s/p CABG 11/2000 (L-LAD, S-RCA);  b. Lexiscan Myoview 11/12: EF 45%, ischemia and possibly some scar at the base of the  inferolateral wall;   c. 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 s/p mechanical (St. Jude) MVR 2002;  b. Echo 5/11: Mild LVH, EF 45-50%, mild AS, mild AI, mean gradient 11 mmHg, MVR with normal gradients, moderate LAE, mild RAE;  c.  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 40 capsule 0   clopidogrel (PLAVIX) 75 MG tablet TAKE 1 TABLET BY MOUTH DAILY WITH BREAKFAST. 90 tablet 3   glipiZIDE (GLUCOTROL XL) 10 MG 24 hr tablet Take 1 tablet (10 mg total) by mouth daily. 90 tablet 3   insulin glargine (LANTUS) 100 UNIT/ML injection Inject 0.7 mLs (70 Units total) into the skin at bedtime. (Patient taking differently: Inject 70 Units into the skin. Take 70 units if over 150 daily) 10 mL 11    JANUVIA 50 MG tablet Take 0.5 tablets (25 mg total) by mouth daily. 90 tablet 2   metolazone (ZAROXOLYN) 2.5 MG tablet Take only as directed by CHF clinic. 5 tablet 0   metoprolol succinate (TOPROL-XL) 25 MG 24 hr tablet Take 1 tablet (25 mg total) by mouth daily. 90 tablet 3   nitroGLYCERIN (NITROSTAT) 0.4 MG SL tablet Place 1 tablet (0.4 mg total) under the tongue every 5 (five) minutes as needed for chest pain. 50 tablet 10   OneTouch Delica Lancets 97D MISC TEST 2 TIMES DAILY AS DIRECTED 100 each 5   ONETOUCH VERIO test strip USE TO TEST TWICE A DAY 200 strip 3   pantoprazole (PROTONIX) 40 MG tablet Take 1 tablet (40 mg total) by mouth daily. 90 tablet 3  potassium chloride SA (KLOR-CON M) 20 MEQ tablet Take 4mq when you take metolazone. 10 tablet 0   rosuvastatin (CRESTOR) 20 MG tablet 20 mg. Patient takes 1 tablet by mouth every other day.     sodium bicarbonate 650 MG tablet Take 650 mg by mouth 2 (two) times daily.     torsemide (DEMADEX) 20 MG tablet Patient takes 60 mg by mouth in the morning and 60 mg in the evening.     warfarin (COUMADIN) 5 MG tablet Take 1 tablet (5 mg total) by mouth daily. (Patient taking differently: Take 5 mg by mouth daily. Alternates 2.5 x2 days then '5mg'$  alternating) 90 tablet 3   No current facility-administered medications for this visit.   Allergies  Allergen Reactions   Allopurinol Shortness Of Breath   Doxycycline Hives   Farxiga [Dapagliflozin]     Can not tolerate - dizzy weak almost passed out   Social History   Socioeconomic History   Marital status: Married    Spouse name: JMarcie Bal  Number of children: 1   Years of education: 12   Highest education level: High school graduate  Occupational History   Occupation: disabled  Tobacco Use   Smoking status: Former    Types: Cigars   Smokeless tobacco: Never  Vaping Use   Vaping Use: Never used  Substance and Sexual Activity   Alcohol use: No    Comment: "drank for 30 years; quit ~ 2000"    Drug use: No    Types: Marijuana    Comment: quit in 2000 'reefer'   Sexual activity: Yes  Other Topics Concern   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   Social Determinants of Health   Financial Resource Strain: Not on file  Food Insecurity: Not on file  Transportation Needs: Not on file  Physical Activity: Not on file  Stress: Not on file  Social Connections: Not on file  Intimate Partner Violence: Not on file   Family History  Problem Relation Age of Onset   Diabetes Mother    Hypertension Father    Thyroid disease Sister    Diabetes Brother    Arthritis Brother    Depression Brother    Heart disease Brother    Hyperlipidemia Brother    Hypertension Brother    Hypertension Brother    Diabetes Brother    Heart disease Brother    Hyperlipidemia Brother    There were no vitals taken for this visit.  Wt Readings from Last 3 Encounters:  01/23/22 86.4 kg (190 lb 6.4 oz)  01/15/22 86.3 kg (190 lb 3.2 oz)  01/02/22 86 kg (189 lb 9.6 oz)   PHYSICAL EXAM: General:  NAD. No resp difficulty[facility] HEENT: Normal Neck: Supple. JVP 7-8. Carotids 2+ bilat; no bruits. No lymphadenopathy or thryomegaly appreciated. Cor: PMI nondisplaced. Irregular rate & rhythm. No rubs, gallops or murmurs. Lungs: Clear Abdomen: Soft, nontender, +distended. No hepatosplenomegaly. No bruits or masses. Good bowel sounds. Extremities: No cyanosis, clubbing, rash, 3+ BLE edema w/ open areas and weeping. DP and PT pulses dopplerable. BLE cool and toes dusky. Neuro: Alert & oriented x 3, cranial nerves grossly intact. Moves all 4 extremities w/o difficulty. Affect pleasant.  ASSESSMENT & PLAN: 1. Acute on chronic systolic CHF: Last echo in 4/21 with EF 25-30%, severe LV dilation, normal RV, mild AS and AI, normal mechanical MV.  St Jude ICD. Suspect ischemic cardiomyopathy.  He  is markedly volume overloaded on exam.  AKI at  admission, baseline creatinine around 2.1.  Echo 3/23 with EF 20-25%, RV severely HK, mechanical MV with mean gradient 6, mod-severe TR, moderate AI, ?low flow/low gradient moderate-severe AS with mean gradient 15/AVA 0.96 cm^2. TEE 3/23 showed EF around 25% but only mildly decreased RV function and trivial TR, normally functioning mechanical MV and abnormal aortic valve with moderate-severe AS and AI. He is volume overloaded on exam today, weight up 5 lbs. Worse NYHA IIIb symptoms. - Give Furoscix x 3 days + 40 KCL with each dose. Hold torsemide while using Furoscix. CMET and BNP today. - Resume torsemide 60 mg bid after completed 3 days of Furoscix. - Continue Toprol XL 25 mg daily for rate control. May need to decrease dose back down with volume overload. - No SGLT2i/MRA/ARNI yet with AKI on CKD.  - Not candidate at this time for LVAD given renal dysfunction.  - I personally applied Mepilex dressings to open, weeping areas on legs. - Will see if we can get him to wound center to evaluate, hopeful this will improve with diuresis. - Will give Keflex 500 mg QID x 10 days for suspected cellulitis. CBC today. 2. CAD: s/p CABG with MVR in 2002. Last cath 10/21 with occluded SVG-RCA, and 80% mRCA, totally occluded LAD, patent LIMA-LAD, 85% ostial LCx treated with DES. No chest pain. - Continue Plavix (? transition ASA in future as >1 year post-PCI).  - Continue Crestor.  3. Atrial fibrillation: Permanent. Rate controlled. - Continue Toprol XL 25 mg daily.  - On warfarin. 4. 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 INR goal 2.5-3.5.  - INR followed by PCP. 5. CKD stage 3: BMET. Follow closely with diuresis. Last SCr 2.22, K 4.3 on 01/17/22 6. Aortic valve disorder: Abnormal aortic valve, looks functionally bicuspid with fixed noncoronary and right coronary cusps.  Echo with ? moderate-severe AS with AVA 1.2 cm^2 but DI 0.23. There  was moderate-severe AI as well. Hemodynamic right/left heart cath suggested only mild aortic stenosis. Do not think aortic valve is diseased enough to warrant TAVR at this time, reviewed with Dr. Burt Knack.   7. Transaminitis. Mild, likely due to passive congestion.  8. IDA: Required Feraheme inpatient. Repeat iron indices next visit.   Follow up with APP next week, may need admission.  Allena Katz, FNP-BC 01/30/22  FUROSCIX prescribed  Patient viewed patient education video with QR code for Caren Griffins code for [place] placed on AVS  Call [facility] at [phone] for questions regarding on body infuser.  Day 1  FUROSCIX 80 mg once daily  via on body infuser + KDUR 40 kcl   Day 2  FUROSCIX 80 mg once daily  via on body infuser+ KDUR 40 KCL   Day 3 FUROSCIX  80 mg once daily  via on body infuser+ KDUR 40KCL

## 2022-01-31 ENCOUNTER — Telehealth (HOSPITAL_COMMUNITY): Payer: Self-pay | Admitting: Surgery

## 2022-01-31 ENCOUNTER — Ambulatory Visit (HOSPITAL_COMMUNITY)
Admission: RE | Admit: 2022-01-31 | Discharge: 2022-01-31 | Disposition: A | Discharge status: Home / Self Care | Payer: PPO | Source: Ambulatory Visit | Attending: Family Medicine | Admitting: Family Medicine

## 2022-01-31 ENCOUNTER — Encounter (HOSPITAL_BASED_OUTPATIENT_CLINIC_OR_DEPARTMENT_OTHER): Payer: PPO | Attending: General Surgery | Admitting: General Surgery

## 2022-01-31 ENCOUNTER — Encounter (HOSPITAL_COMMUNITY): Payer: Self-pay

## 2022-01-31 VITALS — BP 102/64 | HR 95 | Wt 180.2 lb

## 2022-01-31 DIAGNOSIS — D509 Iron deficiency anemia, unspecified: Secondary | ICD-10-CM | POA: Diagnosis not present

## 2022-01-31 DIAGNOSIS — I482 Chronic atrial fibrillation, unspecified: Secondary | ICD-10-CM | POA: Diagnosis not present

## 2022-01-31 DIAGNOSIS — E11622 Type 2 diabetes mellitus with other skin ulcer: Secondary | ICD-10-CM | POA: Insufficient documentation

## 2022-01-31 DIAGNOSIS — I130 Hypertensive heart and chronic kidney disease with heart failure and stage 1 through stage 4 chronic kidney disease, or unspecified chronic kidney disease: Secondary | ICD-10-CM | POA: Insufficient documentation

## 2022-01-31 DIAGNOSIS — Z952 Presence of prosthetic heart valve: Secondary | ICD-10-CM | POA: Insufficient documentation

## 2022-01-31 DIAGNOSIS — I4821 Permanent atrial fibrillation: Secondary | ICD-10-CM | POA: Diagnosis not present

## 2022-01-31 DIAGNOSIS — I5022 Chronic systolic (congestive) heart failure: Secondary | ICD-10-CM | POA: Diagnosis not present

## 2022-01-31 DIAGNOSIS — I25118 Atherosclerotic heart disease of native coronary artery with other forms of angina pectoris: Secondary | ICD-10-CM

## 2022-01-31 DIAGNOSIS — Z9889 Other specified postprocedural states: Secondary | ICD-10-CM

## 2022-01-31 DIAGNOSIS — I2510 Atherosclerotic heart disease of native coronary artery without angina pectoris: Secondary | ICD-10-CM | POA: Insufficient documentation

## 2022-01-31 DIAGNOSIS — Z79899 Other long term (current) drug therapy: Secondary | ICD-10-CM | POA: Insufficient documentation

## 2022-01-31 DIAGNOSIS — I5042 Chronic combined systolic (congestive) and diastolic (congestive) heart failure: Secondary | ICD-10-CM | POA: Insufficient documentation

## 2022-01-31 DIAGNOSIS — N183 Chronic kidney disease, stage 3 unspecified: Secondary | ICD-10-CM | POA: Insufficient documentation

## 2022-01-31 DIAGNOSIS — E1122 Type 2 diabetes mellitus with diabetic chronic kidney disease: Secondary | ICD-10-CM | POA: Diagnosis not present

## 2022-01-31 DIAGNOSIS — I4819 Other persistent atrial fibrillation: Secondary | ICD-10-CM

## 2022-01-31 DIAGNOSIS — N179 Acute kidney failure, unspecified: Secondary | ICD-10-CM | POA: Insufficient documentation

## 2022-01-31 DIAGNOSIS — R7401 Elevation of levels of liver transaminase levels: Secondary | ICD-10-CM | POA: Insufficient documentation

## 2022-01-31 DIAGNOSIS — Z951 Presence of aortocoronary bypass graft: Secondary | ICD-10-CM | POA: Diagnosis not present

## 2022-01-31 DIAGNOSIS — Q231 Congenital insufficiency of aortic valve: Secondary | ICD-10-CM | POA: Insufficient documentation

## 2022-01-31 DIAGNOSIS — I890 Lymphedema, not elsewhere classified: Secondary | ICD-10-CM | POA: Insufficient documentation

## 2022-01-31 DIAGNOSIS — Z7901 Long term (current) use of anticoagulants: Secondary | ICD-10-CM | POA: Diagnosis not present

## 2022-01-31 DIAGNOSIS — L97812 Non-pressure chronic ulcer of other part of right lower leg with fat layer exposed: Secondary | ICD-10-CM | POA: Insufficient documentation

## 2022-01-31 DIAGNOSIS — L97822 Non-pressure chronic ulcer of other part of left lower leg with fat layer exposed: Secondary | ICD-10-CM | POA: Insufficient documentation

## 2022-01-31 DIAGNOSIS — I359 Nonrheumatic aortic valve disorder, unspecified: Secondary | ICD-10-CM

## 2022-01-31 DIAGNOSIS — Z7902 Long term (current) use of antithrombotics/antiplatelets: Secondary | ICD-10-CM | POA: Diagnosis not present

## 2022-01-31 DIAGNOSIS — L97222 Non-pressure chronic ulcer of left calf with fat layer exposed: Secondary | ICD-10-CM | POA: Diagnosis not present

## 2022-01-31 DIAGNOSIS — I872 Venous insufficiency (chronic) (peripheral): Secondary | ICD-10-CM | POA: Diagnosis not present

## 2022-01-31 LAB — BASIC METABOLIC PANEL
Anion gap: 12 (ref 5–15)
BUN: 146 mg/dL — ABNORMAL HIGH (ref 8–23)
CO2: 29 mmol/L (ref 22–32)
Calcium: 8.9 mg/dL (ref 8.9–10.3)
Chloride: 92 mmol/L — ABNORMAL LOW (ref 98–111)
Creatinine, Ser: 2.15 mg/dL — ABNORMAL HIGH (ref 0.61–1.24)
GFR, Estimated: 33 mL/min — ABNORMAL LOW (ref >60)
Glucose, Bld: 397 mg/dL — ABNORMAL HIGH (ref 70–99)
Potassium: 4.3 mmol/L (ref 3.5–5.1)
Sodium: 133 mmol/L — ABNORMAL LOW (ref 135–145)

## 2022-01-31 LAB — BRAIN NATRIURETIC PEPTIDE: B Natriuretic Peptide: 2936 pg/mL — ABNORMAL HIGH (ref 0.0–100.0)

## 2022-01-31 MED ORDER — TORSEMIDE 20 MG PO TABS: 60.0000 mg | ORAL_TABLET | Freq: Two times a day (BID) | ORAL | 6 refills | Status: DC

## 2022-01-31 NOTE — Progress Notes (Signed)
John [name] ([id number]) , Visit Report for 01/31/2022 Allergy List Details Patient Name: Date of Service: John [name] 01/31/2022 2:00 PM Medical Record Number: [id number] Patient Account Number: [id number] Date of Birth/Sex: Treating RN: 20-Apr-1951 (72 y.o. [name] Primary Care George Alcantar: Claretta Fraise Other Clinician: Referring Rashad Obeid: Treating Jaramiah Bossard/Extender: Roosvelt Maser Weeks in Treatment: 0 Allergies Active Allergies doxycycline HCl Reaction: hives Severity: Moderate Wilder Glade Reaction: weakness Allergy Notes Electronic Signature(s) Signed: 01/31/2022 5:57:27 PM By: Levan Hurst RN, BSN Entered By: Levan Hurst on 01/31/2022 14:09:11 -------------------------------------------------------------------------------- Arrival Information Details Patient Name: Date of Service: John Spell L. 01/31/2022 2:00 PM Medical Record Number: [id number] Patient Account Number: [id number] Date of Birth/Sex: Treating RN: 04-03-1951 (71 y.o. [name] Primary Care Leyna Vanderkolk: Claretta Fraise Other Clinician: Referring Dahlton Hinde: Treating Fabiana Dromgoole/Extender: Lenell Antu in Treatment: 0 Visit Information Patient Arrived: Ambulatory Arrival Time: 14:01 Accompanied By: wife Transfer Assistance: None Patient Identification Verified: Yes Secondary Verification Process Completed: Yes Patient Has Alerts: Yes Patient Alerts: Patient on Blood Thinner ABIs non comp bilat Electronic Signature(s) Signed: 01/31/2022 5:57:27 PM By: Levan Hurst RN, BSN Entered By: Levan Hurst on 01/31/2022 14:58:40 -------------------------------------------------------------------------------- Clinic Level of Care Assessment Details Patient Name: Date of Service: John [name] 01/31/2022 2:00 PM Medical Record Number: [id number] Patient Account Number: [id number] Date of Birth/Sex: Treating RN: Sep 12, 1950 (71 y.o. [name] Primary Care Tip Atienza: Claretta Fraise Other Clinician: Referring Maryanne Huneycutt: Treating Leola Fiore/Extender: Lenell Antu in Treatment: 0 Clinic Level of Care Assessment Items TOOL 1 Quantity Score X- 1 0 Use when EandM and Procedure is performed on INITIAL visit ASSESSMENTS - Nursing Assessment / Reassessment X- 1 20 General Physical Exam (combine w/ comprehensive assessment (listed just below) when performed on new pt. evals) X- 1 25 Comprehensive Assessment (HX, ROS, Risk Assessments, Wounds Hx, etc.) ASSESSMENTS - Wound and Skin Assessment / Reassessment '[]'$  - 0 Dermatologic / Skin Assessment (not related to wound area) ASSESSMENTS - Ostomy and/or Continence Assessment and Care '[]'$  - 0 Incontinence Assessment and Management '[]'$  - 0 Ostomy Care Assessment and Management (repouching, etc.) PROCESS - Coordination of Care X - Simple Patient / Family Education for ongoing care 1 15 '[]'$  - 0 Complex (extensive) Patient / Family Education for ongoing care X- 1 10 Staff obtains Programmer, systems, Records, T Results / Process Orders est '[]'$  - 0 Staff telephones HHA, Nursing Homes / Clarify orders / etc '[]'$  - 0 Routine Transfer to another Facility (non-emergent condition) '[]'$  - 0 Routine Hospital Admission (non-emergent condition) X- 1 15 New Admissions / Biomedical engineer / Ordering NPWT Apligraf, etc. , '[]'$  - 0 Emergency Hospital Admission (emergent condition) PROCESS - Special Needs '[]'$  - 0 Pediatric / Minor Patient Management '[]'$  - 0 Isolation Patient Management '[]'$  - 0 Hearing / Language / Visual special needs '[]'$  - 0 Assessment of Community assistance (transportation, D/C planning, etc.) '[]'$  - 0 Additional assistance / Altered mentation '[]'$  - 0 Support Surface(s) Assessment (bed, cushion, seat, etc.) INTERVENTIONS - Miscellaneous '[]'$  - 0 External ear exam '[]'$  - 0 Patient Transfer (multiple staff / Civil Service fast streamer / Similar devices) '[]'$  - 0 Simple  Staple / Suture removal (25 or less) '[]'$  - 0 Complex Staple / Suture removal (26 or more) '[]'$  - 0 Hypo/Hyperglycemic Management (do not check if billed separately) X- 1 15 Ankle / Brachial Index (ABI) - do not check if billed separately Has the patient been seen at the hospital  within the last three years: Yes Total Score: 100 Level Of Care: New/Established - Level 3 Electronic Signature(s) Signed: 01/31/2022 5:57:27 PM By: Levan Hurst RN, BSN Signed: 01/31/2022 5:57:27 PM By: Levan Hurst RN, BSN Entered By: Levan Hurst on 01/31/2022 17:41:10 -------------------------------------------------------------------------------- Encounter Discharge Information Details Patient Name: Date of Service: John Spell L. 01/31/2022 2:00 PM Medical Record Number: [id number] Patient Account Number: [id number] Date of Birth/Sex: Treating RN: April 16, 1951 (71 y.o. [name] Primary Care Emmaly Leech: Claretta Fraise Other Clinician: Referring Janiylah Hannis: Treating Mahad Newstrom/Extender: Lenell Antu in Treatment: 0 Encounter Discharge Information Items Post Procedure Vitals Discharge Condition: Stable Temperature (F): 97.3 Ambulatory Status: Ambulatory Pulse (bpm): 98 Discharge Destination: Home Respiratory Rate (breaths/min): 18 Transportation: Private Auto Blood Pressure (mmHg): 96/64 Accompanied By: wife Schedule Follow-up Appointment: Yes Clinical Summary of Care: Patient Declined Electronic Signature(s) Signed: 01/31/2022 5:57:27 PM By: Levan Hurst RN, BSN Entered By: Levan Hurst on 01/31/2022 17:42:41 -------------------------------------------------------------------------------- Lower Extremity Assessment Details Patient Name: Date of Service: John Spell L. 01/31/2022 2:00 PM Medical Record Number: [id number] Patient Account Number: [id number] Date of Birth/Sex: Treating RN: 05-20-1951 (71 y.o. [name] Primary Care [place] Warriner:  Claretta Fraise Other Clinician: Referring Damarco Keysor: Treating Hatice Bubel/Extender: Roosvelt Maser Weeks in Treatment: 0 Edema Assessment Assessed: [Left: No] [Right: No] Edema: [Left: Yes] [Right: Yes] Calf Left: Right: Point of Measurement: 36 cm From Medial Instep 36.5 cm 35.5 cm Ankle Left: Right: Point of Measurement: 12 cm From Medial Instep 26.5 cm 27.5 cm Knee To Floor Left: Right: From Medial Instep 42 cm 42 cm Vascular Assessment Pulses: Dorsalis Pedis Palpable: [Left:Yes] [Right:Yes] Electronic Signature(s) Signed: 01/31/2022 5:57:27 PM By: Levan Hurst RN, BSN Entered By: Levan Hurst on 01/31/2022 14:45:43 -------------------------------------------------------------------------------- Multi Wound Chart Details Patient Name: Date of Service: John Spell L. 01/31/2022 2:00 PM Medical Record Number: [id number] Patient Account Number: [id number] Date of Birth/Sex: Treating RN: [birth year] (71 y.o. [name] Primary Care Fabrizio Filip: Claretta Fraise Other Clinician: Referring Grainne Knights: Treating Markez Dowland/Extender: Roosvelt Maser Weeks in Treatment: 0 Vital Signs Height(in): 58 Pulse(bpm): 37 Weight(lbs): 180 Blood Pressure(mmHg): 96/64 Body Mass Index(BMI): 25.1 Temperature(F): 97.3 Respiratory Rate(breaths/min): 18 Photos: Left, Anterior Lower Leg Left, Medial Lower Leg Left, Proximal, Posterior Lower Leg Wound Location: Blister Blister Blister Wounding Event: Venous Leg Ulcer Venous Leg Ulcer Venous Leg Ulcer Primary Etiology: Glaucoma, Anemia, Arrhythmia, Glaucoma, Anemia, Arrhythmia, Glaucoma, Anemia, Arrhythmia, Comorbid History: Congestive Heart Failure, Coronary Congestive Heart Failure, Coronary Congestive Heart Failure, Coronary Artery Disease, Hypertension, Artery Disease, Hypertension, Artery Disease, Hypertension, Peripheral Venous Disease, Type II Peripheral Venous Disease, Type II Peripheral  Venous Disease, Type II Diabetes, Osteoarthritis Diabetes, Osteoarthritis Diabetes, Osteoarthritis 01/24/2022 01/24/2022 01/24/2022 Date Acquired: 0 0 0 Weeks of Treatment: Open Open Open Wound Status: No No No Wound Recurrence: No No No Clustered Wound: N/A N/A N/A Clustered Quantity: 5.8x8x0.1 2.5x5.4x0.1 1.6x2x0.1 Measurements L x W x D (cm) 36.442 10.603 2.513 A (cm) : rea 3.644 1.06 0.251 Volume (cm) : 0.00% 0.00% 0.00% % Reduction in A rea: 0.00% 0.00% 0.00% % Reduction in Volume: Full Thickness Without Exposed Full Thickness Without Exposed Full Thickness Without Exposed Classification: Support Structures Support Structures Support Structures Large Large Medium Exudate A mount: Serous Serous Serous Exudate Type: Geophysical data processor Exudate Color: Flat and Intact Flat and Intact Flat and Intact Wound Margin: Large (67-100%) Large (67-100%) Small (1-33%) Granulation A mount: Red Pink Pink Granulation Quality: None Present (0%) Small (1-33%) Large (67-100%) Necrotic A  mount: N/A Adherent Slough Adherent Slough Necrotic Tissue: Fat Layer (Subcutaneous Tissue): Yes Fat Layer (Subcutaneous Tissue): Yes Fat Layer (Subcutaneous Tissue): Yes Exposed Structures: Fascia: No Fascia: No Fascia: No Tendon: No Tendon: No Tendon: No Muscle: No Muscle: No Muscle: No Joint: No Joint: No Joint: No Bone: No Bone: No Bone: No None None None Epithelialization: Debridement - Selective/Open Wound Debridement - Selective/Open Wound Debridement - Selective/Open Wound Debridement: Pre-procedure Verification/Time Out 15:08 15:08 15:08 Taken: N/A [facility] Tissue Debrided: Skin/Epidermis Non-Viable Tissue Non-Viable Tissue Level: 16 4 3.2 Debridement A (sq cm): rea Scissors Curette Curette Instrument: Minimum Minimum Minimum Bleeding: Pressure Pressure Pressure Hemostasis Achieved: 0 0 0 Procedural Pain: 0 0 0 Post Procedural Pain: Procedure was  tolerated well Procedure was tolerated well Procedure was tolerated well Debridement Treatment Response: 5.8x8x0.1 2.5x5.4x0.1 1.6x2x0.1 Post Debridement Measurements L x W x D (cm) 3.644 1.06 0.251 Post Debridement Volume: (cm) Debridement Debridement Debridement Procedures Performed: Wound Number: 4 5 N/A Photos: N/A Left, Distal, Posterior Lower Leg Right, Circumferential Lower Leg N/A Wound Location: Blister Blister N/A Wounding Event: Venous Leg Ulcer Venous Leg Ulcer N/A Primary Etiology: Glaucoma, Anemia, Arrhythmia, Glaucoma, Anemia, Arrhythmia, N/A Comorbid History: Congestive Heart Failure, Coronary Congestive Heart Failure, Coronary Artery Disease, Hypertension, Artery Disease, Hypertension, Peripheral Venous Disease, Type II Peripheral Venous Disease, Type II Diabetes, Osteoarthritis Diabetes, Osteoarthritis 01/24/2022 01/24/2022 N/A Date Acquired: 0 0 N/A Weeks of Treatment: Open Open N/A Wound Status: No No N/A Wound Recurrence: No Yes N/A Clustered Wound: N/A 9 N/A Clustered Quantity: 1.5x4x0.1 15x29x0.1 N/A Measurements L x W x D (cm) 4.712 341.648 N/A A (cm) : rea 0.471 34.165 N/A Volume (cm) : 0.00% 0.00% N/A % Reduction in A rea: 0.00% 0.00% N/A % Reduction in Volume: Full Thickness Without Exposed Full Thickness Without Exposed N/A Classification: Support Structures Support Structures Large Large N/A Exudate A mount: Serous Serous N/A Exudate Type: Physiological scientist N/A Exudate Color: Flat and Intact Flat and Intact N/A Wound Margin: Large (67-100%) Medium (34-66%) N/A Granulation A mount: Pink Pink N/A Granulation Quality: Small (1-33%) Medium (34-66%) N/A Necrotic A mount: Adherent Slough Eschar, Adherent Slough N/A Necrotic Tissue: Fat Layer (Subcutaneous Tissue): Yes Fat Layer (Subcutaneous Tissue): Yes N/A Exposed Structures: Fascia: No Fascia: No Tendon: No Tendon: No Muscle: No Muscle: No Joint: No Joint: No Bone:  No Bone: No None None N/A Epithelialization: N/A Debridement - Selective/Open Wound N/A Debridement: Pre-procedure Verification/Time Out N/A 15:08 N/A Taken: N/A Slough N/A Tissue Debrided: N/A Skin/Epidermis N/A Level: N/A 100 N/A Debridement A (sq cm): rea N/A Curette, Scissors N/A Instrument: N/A Minimum N/A Bleeding: N/A Pressure N/A Hemostasis A chieved: N/A 0 N/A Procedural Pain: N/A 0 N/A Post Procedural Pain: N/A Procedure was tolerated well N/A Debridement Treatment Response: N/A 15x29x0.1 N/A Post Debridement Measurements L x W x D (cm) N/A 34.165 N/A Post Debridement Volume: (cm) N/A Debridement N/A Procedures Performed: Treatment Notes Wound #1 (Lower Leg) Wound Laterality: Left, Anterior Cleanser Soap and Water Discharge Instruction: May shower and wash wound with dial antibacterial soap and water prior to dressing change. Wound Cleanser Discharge Instruction: Cleanse the wound with wound cleanser prior to applying a clean dressing using gauze sponges, not tissue or cotton balls. Peri-Wound Care Topical Primary Dressing Maxorb Extra Calcium Alginate Dressing, 4x4 in Discharge Instruction: Apply calcium alginate to wound bed as instructed Secondary Dressing ABD Pad, 8x10 Discharge Instruction: Apply over primary dressing as directed. Woven Gauze Sponge, Non-Sterile 4x4 in Discharge Instruction: Apply over primary dressing as  directed. Secured With Elastic Bandage 4 inch (ACE bandage) Discharge Instruction: Secure with ACE bandage as directed. Kerlix Roll Sterile, 4.5x3.1 (in/yd) Discharge Instruction: Secure with Kerlix as directed. Paper Tape, 2x10 (in/yd) Discharge Instruction: Secure dressing with tape as directed. Compression Wrap Compression Stockings Add-Ons Wound #2 (Lower Leg) Wound Laterality: Left, Medial Cleanser Soap and Water Discharge Instruction: May shower and wash wound with dial antibacterial soap and water prior to  dressing change. Wound Cleanser Discharge Instruction: Cleanse the wound with wound cleanser prior to applying a clean dressing using gauze sponges, not tissue or cotton balls. Peri-Wound Care Topical Primary Dressing Maxorb Extra Calcium Alginate Dressing, 4x4 in Discharge Instruction: Apply calcium alginate to wound bed as instructed Secondary Dressing ABD Pad, 5x9 Discharge Instruction: Apply over primary dressing as directed. Woven Gauze Sponge, Non-Sterile 4x4 in Discharge Instruction: Apply over primary dressing as directed. Secured With Elastic Bandage 4 inch (ACE bandage) Discharge Instruction: Secure with ACE bandage as directed. Kerlix Roll Sterile, 4.5x3.1 (in/yd) Discharge Instruction: Secure with Kerlix as directed. Paper Tape, 2x10 (in/yd) Discharge Instruction: Secure dressing with tape as directed. Compression Wrap Compression Stockings Add-Ons Wound #3 (Lower Leg) Wound Laterality: Left, Posterior, Proximal Cleanser Soap and Water Discharge Instruction: May shower and wash wound with dial antibacterial soap and water prior to dressing change. Wound Cleanser Discharge Instruction: Cleanse the wound with wound cleanser prior to applying a clean dressing using gauze sponges, not tissue or cotton balls. Peri-Wound Care Topical Primary Dressing Maxorb Extra Calcium Alginate Dressing, 4x4 in Discharge Instruction: Apply calcium alginate to wound bed as instructed Secondary Dressing ABD Pad, 5x9 Discharge Instruction: Apply over primary dressing as directed. Woven Gauze Sponge, Non-Sterile 4x4 in Discharge Instruction: Apply over primary dressing as directed. Secured With Elastic Bandage 4 inch (ACE bandage) Discharge Instruction: Secure with ACE bandage as directed. Kerlix Roll Sterile, 4.5x3.1 (in/yd) Discharge Instruction: Secure with Kerlix as directed. Paper Tape, 2x10 (in/yd) Discharge Instruction: Secure dressing with tape as directed. Compression  Wrap Compression Stockings Add-Ons Wound #4 (Lower Leg) Wound Laterality: Left, Posterior, Distal Cleanser Soap and Water Discharge Instruction: May shower and wash wound with dial antibacterial soap and water prior to dressing change. Wound Cleanser Discharge Instruction: Cleanse the wound with wound cleanser prior to applying a clean dressing using gauze sponges, not tissue or cotton balls. Peri-Wound Care Topical Primary Dressing Maxorb Extra Calcium Alginate Dressing, 4x4 in Discharge Instruction: Apply calcium alginate to wound bed as instructed Secondary Dressing ABD Pad, 5x9 Discharge Instruction: Apply over primary dressing as directed. Woven Gauze Sponge, Non-Sterile 4x4 in Discharge Instruction: Apply over primary dressing as directed. Secured With Elastic Bandage 4 inch (ACE bandage) Discharge Instruction: Secure with ACE bandage as directed. Kerlix Roll Sterile, 4.5x3.1 (in/yd) Discharge Instruction: Secure with Kerlix as directed. Paper Tape, 2x10 (in/yd) Discharge Instruction: Secure dressing with tape as directed. Compression Wrap Compression Stockings Add-Ons Wound #5 (Lower Leg) Wound Laterality: Right, Circumferential Cleanser Soap and Water Discharge Instruction: May shower and wash wound with dial antibacterial soap and water prior to dressing change. Wound Cleanser Discharge Instruction: Cleanse the wound with wound cleanser prior to applying a clean dressing using gauze sponges, not tissue or cotton balls. Peri-Wound Care Topical Primary Dressing Maxorb Extra Calcium Alginate Dressing, 4x4 in Discharge Instruction: Apply calcium alginate to wound bed as instructed Secondary Dressing ABD Pad, 8x10 Discharge Instruction: Apply over primary dressing as directed. Woven Gauze Sponge, Non-Sterile 4x4 in Discharge Instruction: Apply over primary dressing as directed. Secured With Elastic Bandage 4 inch (ACE  bandage) Discharge Instruction: Secure with ACE  bandage as directed. Kerlix Roll Sterile, 4.5x3.1 (in/yd) Discharge Instruction: Secure with Kerlix as directed. Paper Tape, 2x10 (in/yd) Discharge Instruction: Secure dressing with tape as directed. Compression Wrap Compression Stockings Add-Ons Electronic Signature(s) Signed: 01/31/2022 6:15:58 PM By: Fredirick Maudlin MD FACS Signed: 02/01/2022 5:38:25 PM By: Levan Hurst RN, BSN Entered By: Fredirick Maudlin on 01/31/2022 18:15:58 -------------------------------------------------------------------------------- Multi-Disciplinary Care Plan Details Patient Name: Date of Service: Boneta Lucks [facility] L. 01/31/2022 2:00 PM Medical Record Number: [id number] Patient Account Number: [id number] Date of Birth/Sex: Treating RN: 10-Nov-1950 (71 y.o. [name] Primary Care Raciel Caffrey: Claretta Fraise Other Clinician: Referring Burns Timson: Treating Rector Devonshire/Extender: Lenell Antu in Treatment: 0 Multidisciplinary Care Plan reviewed with physician Active Inactive Abuse / Safety / Falls / Self Care Management Nursing Diagnoses: Potential for falls Potential for injury related to falls Goals: Patient will not experience any injury related to falls Date Initiated: 01/31/2022 Target Resolution Date: 03/02/2022 Goal Status: Active Patient/caregiver will verbalize/demonstrate measures taken to prevent injury and/or falls Date Initiated: 01/31/2022 Target Resolution Date: 03/02/2022 Goal Status: Active Interventions: Assess Activities of Daily Living upon admission and as needed Assess fall risk on admission and as needed Assess: immobility, friction, shearing, incontinence upon admission and as needed Assess impairment of mobility on admission and as needed per policy Assess personal safety and home safety (as indicated) on admission and as needed Assess self care needs on admission and as needed Provide education on personal and home  safety Notes: Nutrition Nursing Diagnoses: Impaired glucose control: actual or potential Potential for alteratiion in Nutrition/Potential for imbalanced nutrition Goals: Patient/caregiver agrees to and verbalizes understanding of need to use nutritional supplements and/or vitamins as prescribed Date Initiated: 01/31/2022 Target Resolution Date: 03/02/2022 Goal Status: Active Patient/caregiver will maintain therapeutic glucose control Date Initiated: 01/31/2022 Target Resolution Date: 03/02/2022 Goal Status: Active Interventions: Assess HgA1c results as ordered upon admission and as needed Assess patient nutrition upon admission and as needed per policy Provide education on elevated blood sugars and impact on wound healing Provide education on nutrition Notes: Venous Leg Ulcer Nursing Diagnoses: Knowledge deficit related to disease process and management Goals: Patient will maintain optimal edema control Date Initiated: 01/31/2022 Target Resolution Date: 03/02/2022 Goal Status: Active Patient/caregiver will verbalize understanding of disease process and disease management Date Initiated: 01/31/2022 Target Resolution Date: 03/02/2022 Goal Status: Active Interventions: Assess peripheral edema status every visit. Compression as ordered Provide education on venous insufficiency Notes: Wound/Skin Impairment Nursing Diagnoses: Impaired tissue integrity Knowledge deficit related to ulceration/compromised skin integrity Goals: Patient/caregiver will verbalize understanding of skin care regimen Date Initiated: 01/31/2022 Target Resolution Date: 03/02/2022 Goal Status: Active Interventions: Assess patient/caregiver ability to obtain necessary supplies Assess patient/caregiver ability to perform ulcer/skin care regimen upon admission and as needed Assess ulceration(s) every visit Provide education on ulcer and skin care Notes: Electronic Signature(s) Signed: 01/31/2022 5:57:27 PM By:  Levan Hurst RN, BSN Entered By: Levan Hurst on 01/31/2022 15:01:52 -------------------------------------------------------------------------------- Pain Assessment Details Patient Name: Date of Service: John Spell L. 01/31/2022 2:00 PM Medical Record Number: [id number] Patient Account Number: [id number] Date of Birth/Sex: Treating RN: 15-Jan-1951 (71 y.o. [name] Primary Care Ezinne Yogi: Claretta Fraise Other Clinician: Referring Tarus Briski: Treating Watt Geiler/Extender: Lenell Antu in Treatment: 0 Active Problems Location of Pain Severity and Description of Pain Patient Has Paino No Site Locations Pain Management and Medication Current Pain Management: Electronic Signature(s) Signed: 01/31/2022 5:57:27 PM By: Levan Hurst RN, BSN Entered By:  Levan Hurst on 01/31/2022 14:54:32 -------------------------------------------------------------------------------- Patient/Caregiver Education Details Patient Name: Date of Service: John [name] 5/24/2023andnbsp2:00 PM Medical Record Number: [id number] Patient Account Number: [id number] Date of Birth/Gender: Treating RN: [birth year] (71 y.o. [name] Primary Care Physician: Claretta Fraise Other Clinician: Referring Physician: Treating Physician/Extender: Lenell Antu in Treatment: 0 Education Assessment Education Provided To: Patient Education Topics Provided Elevated Blood Sugar/ Impact on Healing: Methods: Explain/Verbal Responses: State content correctly Nutrition: Methods: Explain/Verbal Responses: State content correctly Safety: Methods: Explain/Verbal Responses: State content correctly Venous: Methods: Explain/Verbal Responses: State content correctly Wound/Skin Impairment: Methods: Explain/Verbal Responses: State content correctly Electronic Signature(s) Signed: 01/31/2022 5:57:27 PM By: Levan Hurst RN, BSN Entered By:  Levan Hurst on 01/31/2022 15:02:36 -------------------------------------------------------------------------------- Wound Assessment Details Patient Name: Date of Service: John Spell L. 01/31/2022 2:00 PM Medical Record Number: [id number] Patient Account Number: [id number] Date of Birth/Sex: Treating RN: [birth year] (71 y.o. [name] Primary Care Juley Giovanetti: Claretta Fraise Other Clinician: Referring Deonta Bomberger: Treating Kalya Troeger/Extender: Roosvelt Maser Weeks in Treatment: 0 Wound Status Wound Number: 1 Primary Venous Leg Ulcer Etiology: Wound Location: Left, Anterior Lower Leg Wound Open Wounding Event: Blister Status: Date Acquired: 01/24/2022 Comorbid Glaucoma, Anemia, Arrhythmia, Congestive Heart Failure, Coronary Weeks Of Treatment: 0 History: Artery Disease, Hypertension, Peripheral Venous Disease, Type II Clustered Wound: No Diabetes, Osteoarthritis Photos Wound Measurements Length: (cm) 5.8 Width: (cm) 8 Depth: (cm) 0.1 Area: (cm) 36.442 Volume: (cm) 3.644 Wound Description Classification: Full Thickness Without Exposed Support Structures Wound Margin: Flat and Intact Exudate Amount: Large Exudate Type: Serous Exudate Color: amber Foul Odor After Cleansing: Slough/Fibrino % Reduction in Area: 0% % Reduction in Volume: 0% Epithelialization: None Tunneling: No Undermining: No No No Wound Bed Granulation Amount: Large (67-100%) Exposed Structure Granulation Quality: Red Fascia Exposed: No Necrotic Amount: None Present (0%) Fat Layer (Subcutaneous Tissue) Exposed: Yes Tendon Exposed: No Muscle Exposed: No Joint Exposed: No Bone Exposed: No Treatment Notes Wound #1 (Lower Leg) Wound Laterality: Left, Anterior Cleanser Soap and Water Discharge Instruction: May shower and wash wound with dial antibacterial soap and water prior to dressing change. Wound Cleanser Discharge Instruction: Cleanse the wound with wound  cleanser prior to applying a clean dressing using gauze sponges, not tissue or cotton balls. Peri-Wound Care Topical Primary Dressing Maxorb Extra Calcium Alginate Dressing, 4x4 in Discharge Instruction: Apply calcium alginate to wound bed as instructed Secondary Dressing ABD Pad, 8x10 Discharge Instruction: Apply over primary dressing as directed. Woven Gauze Sponge, Non-Sterile 4x4 in Discharge Instruction: Apply over primary dressing as directed. Secured With Elastic Bandage 4 inch (ACE bandage) Discharge Instruction: Secure with ACE bandage as directed. Kerlix Roll Sterile, 4.5x3.1 (in/yd) Discharge Instruction: Secure with Kerlix as directed. Paper Tape, 2x10 (in/yd) Discharge Instruction: Secure dressing with tape as directed. Compression Wrap Compression Stockings Add-Ons Electronic Signature(s) Signed: 01/31/2022 3:12:02 PM By: Sandre Kitty Signed: 01/31/2022 5:57:27 PM By: Levan Hurst RN, BSN Entered By: Sandre Kitty on 01/31/2022 14:49:51 -------------------------------------------------------------------------------- Wound Assessment Details Patient Name: Date of Service: John [name]. 01/31/2022 2:00 PM Medical Record Number: [id number] Patient Account Number: [id number] Date of Birth/Sex: Treating RN: [birth year] (71 y.o. [name] Primary Care Cattleya Dobratz: Claretta Fraise Other Clinician: Referring Odel Schmid: Treating Sutter Ahlgren/Extender: Roosvelt Maser Weeks in Treatment: 0 Wound Status Wound Number: 2 Primary Venous Leg Ulcer Etiology: Wound Location: Left, Medial Lower Leg Wound Open Wounding Event: Blister Status: Date Acquired: 01/24/2022 Comorbid Glaucoma, Anemia, Arrhythmia, Congestive Heart Failure, Coronary Weeks Of  Treatment: 0 History: Artery Disease, Hypertension, Peripheral Venous Disease, Type II Clustered Wound: No Diabetes, Osteoarthritis Photos Wound Measurements Length: (cm) 2.5 Width: (cm)  5.4 Depth: (cm) 0.1 Area: (cm) 10.603 Volume: (cm) 1.06 % Reduction in Area: 0% % Reduction in Volume: 0% Epithelialization: None Tunneling: No Undermining: No Wound Description Classification: Full Thickness Without Exposed Support Structures Wound Margin: Flat and Intact Exudate Amount: Large Exudate Type: Serous Exudate Color: amber Foul Odor After Cleansing: No Slough/Fibrino Yes Wound Bed Granulation Amount: Large (67-100%) Exposed Structure Granulation Quality: Pink Fascia Exposed: No Necrotic Amount: Small (1-33%) Fat Layer (Subcutaneous Tissue) Exposed: Yes Necrotic Quality: Adherent Slough Tendon Exposed: No Muscle Exposed: No Joint Exposed: No Bone Exposed: No Treatment Notes Wound #2 (Lower Leg) Wound Laterality: Left, Medial Cleanser Soap and Water Discharge Instruction: May shower and wash wound with dial antibacterial soap and water prior to dressing change. Wound Cleanser Discharge Instruction: Cleanse the wound with wound cleanser prior to applying a clean dressing using gauze sponges, not tissue or cotton balls. Peri-Wound Care Topical Primary Dressing Maxorb Extra Calcium Alginate Dressing, 4x4 in Discharge Instruction: Apply calcium alginate to wound bed as instructed Secondary Dressing ABD Pad, 5x9 Discharge Instruction: Apply over primary dressing as directed. Woven Gauze Sponge, Non-Sterile 4x4 in Discharge Instruction: Apply over primary dressing as directed. Secured With Elastic Bandage 4 inch (ACE bandage) Discharge Instruction: Secure with ACE bandage as directed. Kerlix Roll Sterile, 4.5x3.1 (in/yd) Discharge Instruction: Secure with Kerlix as directed. Paper Tape, 2x10 (in/yd) Discharge Instruction: Secure dressing with tape as directed. Compression Wrap Compression Stockings Add-Ons Electronic Signature(s) Signed: 01/31/2022 3:12:02 PM By: Sandre Kitty Signed: 01/31/2022 5:57:27 PM By: Levan Hurst RN, BSN Entered By:  Sandre Kitty on 01/31/2022 14:51:16 -------------------------------------------------------------------------------- Wound Assessment Details Patient Name: Date of Service: John [name]. 01/31/2022 2:00 PM Medical Record Number: [id number] Patient Account Number: [id number] Date of Birth/Sex: Treating RN: 1950/12/20 (71 y.o. [name] Primary Care Katarina Riebe: Claretta Fraise Other Clinician: Referring Ahilyn Nell: Treating Persia Lintner/Extender: Roosvelt Maser Weeks in Treatment: 0 Wound Status Wound Number: 3 Primary Venous Leg Ulcer Etiology: Wound Location: Left, Proximal, Posterior Lower Leg Wound Open Wounding Event: Blister Status: Date Acquired: 01/24/2022 Comorbid Glaucoma, Anemia, Arrhythmia, Congestive Heart Failure, Coronary Weeks Of Treatment: 0 History: Artery Disease, Hypertension, Peripheral Venous Disease, Type II Clustered Wound: No Diabetes, Osteoarthritis Photos Wound Measurements Length: (cm) 1.6 Width: (cm) 2 Depth: (cm) 0.1 Area: (cm) 2.513 Volume: (cm) 0.251 % Reduction in Area: 0% % Reduction in Volume: 0% Epithelialization: None Tunneling: No Undermining: No Wound Description Classification: Full Thickness Without Exposed Support Structures Wound Margin: Flat and Intact Exudate Amount: Medium Exudate Type: Serous Exudate Color: amber Foul Odor After Cleansing: No Slough/Fibrino Yes Wound Bed Granulation Amount: Small (1-33%) Exposed Structure Granulation Quality: Pink Fascia Exposed: No Necrotic Amount: Large (67-100%) Fat Layer (Subcutaneous Tissue) Exposed: Yes Necrotic Quality: Adherent Slough Tendon Exposed: No Muscle Exposed: No Joint Exposed: No Bone Exposed: No Treatment Notes Wound #3 (Lower Leg) Wound Laterality: Left, Posterior, Proximal Cleanser Soap and Water Discharge Instruction: May shower and wash wound with dial antibacterial soap and water prior to dressing change. Wound  Cleanser Discharge Instruction: Cleanse the wound with wound cleanser prior to applying a clean dressing using gauze sponges, not tissue or cotton balls. Peri-Wound Care Topical Primary Dressing Maxorb Extra Calcium Alginate Dressing, 4x4 in Discharge Instruction: Apply calcium alginate to wound bed as instructed Secondary Dressing ABD Pad, 5x9 Discharge Instruction: Apply over primary dressing as directed. Woven  Gauze Sponge, Non-Sterile 4x4 in Discharge Instruction: Apply over primary dressing as directed. Secured With Elastic Bandage 4 inch (ACE bandage) Discharge Instruction: Secure with ACE bandage as directed. Kerlix Roll Sterile, 4.5x3.1 (in/yd) Discharge Instruction: Secure with Kerlix as directed. Paper Tape, 2x10 (in/yd) Discharge Instruction: Secure dressing with tape as directed. Compression Wrap Compression Stockings Add-Ons Electronic Signature(s) Signed: 01/31/2022 3:12:02 PM By: Sandre Kitty Signed: 01/31/2022 5:57:27 PM By: Levan Hurst RN, BSN Entered By: Sandre Kitty on 01/31/2022 14:52:02 -------------------------------------------------------------------------------- Wound Assessment Details Patient Name: Date of Service: John [name]. 01/31/2022 2:00 PM Medical Record Number: [id number] Patient Account Number: [id number] Date of Birth/Sex: Treating RN: 09/13/1950 (71 y.o. [name] Primary Care Jaquin Coy: Claretta Fraise Other Clinician: Referring Gizelle Whetsel: Treating Briele Lagasse/Extender: Roosvelt Maser Weeks in Treatment: 0 Wound Status Wound Number: 4 Primary Venous Leg Ulcer Etiology: Wound Location: Left, Distal, Posterior Lower Leg Wound Open Wounding Event: Blister Status: Date Acquired: 01/24/2022 Comorbid Glaucoma, Anemia, Arrhythmia, Congestive Heart Failure, Coronary Weeks Of Treatment: 0 History: Artery Disease, Hypertension, Peripheral Venous Disease, Type II Clustered Wound: No Diabetes,  Osteoarthritis Photos Wound Measurements Length: (cm) 1.5 Width: (cm) 4 Depth: (cm) 0.1 Area: (cm) 4.712 Volume: (cm) 0.471 % Reduction in Area: 0% % Reduction in Volume: 0% Epithelialization: None Tunneling: No Undermining: No Wound Description Classification: Full Thickness Without Exposed Support Structures Wound Margin: Flat and Intact Exudate Amount: Large Exudate Type: Serous Exudate Color: amber Foul Odor After Cleansing: No Slough/Fibrino Yes Wound Bed Granulation Amount: Large (67-100%) Exposed Structure Granulation Quality: Pink Fascia Exposed: No Necrotic Amount: Small (1-33%) Fat Layer (Subcutaneous Tissue) Exposed: Yes Necrotic Quality: Adherent Slough Tendon Exposed: No Muscle Exposed: No Joint Exposed: No Bone Exposed: No Treatment Notes Wound #4 (Lower Leg) Wound Laterality: Left, Posterior, Distal Cleanser Soap and Water Discharge Instruction: May shower and wash wound with dial antibacterial soap and water prior to dressing change. Wound Cleanser Discharge Instruction: Cleanse the wound with wound cleanser prior to applying a clean dressing using gauze sponges, not tissue or cotton balls. Peri-Wound Care Topical Primary Dressing Maxorb Extra Calcium Alginate Dressing, 4x4 in Discharge Instruction: Apply calcium alginate to wound bed as instructed Secondary Dressing ABD Pad, 5x9 Discharge Instruction: Apply over primary dressing as directed. Woven Gauze Sponge, Non-Sterile 4x4 in Discharge Instruction: Apply over primary dressing as directed. Secured With Elastic Bandage 4 inch (ACE bandage) Discharge Instruction: Secure with ACE bandage as directed. Kerlix Roll Sterile, 4.5x3.1 (in/yd) Discharge Instruction: Secure with Kerlix as directed. Paper Tape, 2x10 (in/yd) Discharge Instruction: Secure dressing with tape as directed. Compression Wrap Compression Stockings Add-Ons Electronic Signature(s) Signed: 01/31/2022 3:12:02 PM By: Sandre Kitty Signed: 01/31/2022 5:57:27 PM By: Levan Hurst RN, BSN Entered By: Sandre Kitty on 01/31/2022 14:52:20 -------------------------------------------------------------------------------- Wound Assessment Details Patient Name: Date of Service: John [name]. 01/31/2022 2:00 PM Medical Record Number: [id number] Patient Account Number: [id number] Date of Birth/Sex: Treating RN: 1951-07-19 (70 y.o. [name] Primary Care Dola Lunsford: Claretta Fraise Other Clinician: Referring Jaretssi Kraker: Treating Gimena Buick/Extender: Roosvelt Maser Weeks in Treatment: 0 Wound Status Wound Number: 5 Primary Venous Leg Ulcer Etiology: Wound Location: Right, Circumferential Lower Leg Wound Open Wounding Event: Blister Status: Date Acquired: 01/24/2022 Comorbid Glaucoma, Anemia, Arrhythmia, Congestive Heart Failure, Weeks Of Treatment: 0 History: Coronary Artery Disease, Hypertension, Peripheral Venous Clustered Wound: Yes Disease, Type II Diabetes, Osteoarthritis Photos Wound Measurements Length: (cm) 15 Width: (cm) 29 Depth: (cm) 0.1 Clustered Quantity: 9 Area: (cm) 341.648 Volume: (cm) 34.165 % Reduction  in Area: 0% % Reduction in Volume: 0% Epithelialization: None Tunneling: No Undermining: No Wound Description Classification: Full Thickness Without Exposed Support Stru Wound Margin: Flat and Intact Exudate Amount: Large Exudate Type: Serous Exudate Color: amber ctures Foul Odor After Cleansing: No Slough/Fibrino Yes Wound Bed Granulation Amount: Medium (34-66%) Exposed Structure Granulation Quality: Pink Fascia Exposed: No Necrotic Amount: Medium (34-66%) Fat Layer (Subcutaneous Tissue) Exposed: Yes Necrotic Quality: Eschar, Adherent Slough Tendon Exposed: No Muscle Exposed: No Joint Exposed: No Bone Exposed: No Treatment Notes Wound #5 (Lower Leg) Wound Laterality: Right, Circumferential Cleanser Soap and Water Discharge Instruction: May  shower and wash wound with dial antibacterial soap and water prior to dressing change. Wound Cleanser Discharge Instruction: Cleanse the wound with wound cleanser prior to applying a clean dressing using gauze sponges, not tissue or cotton balls. Peri-Wound Care Topical Primary Dressing Maxorb Extra Calcium Alginate Dressing, 4x4 in Discharge Instruction: Apply calcium alginate to wound bed as instructed Secondary Dressing ABD Pad, 8x10 Discharge Instruction: Apply over primary dressing as directed. Woven Gauze Sponge, Non-Sterile 4x4 in Discharge Instruction: Apply over primary dressing as directed. Secured With Elastic Bandage 4 inch (ACE bandage) Discharge Instruction: Secure with ACE bandage as directed. Kerlix Roll Sterile, 4.5x3.1 (in/yd) Discharge Instruction: Secure with Kerlix as directed. Paper Tape, 2x10 (in/yd) Discharge Instruction: Secure dressing with tape as directed. Compression Wrap Compression Stockings Add-Ons Electronic Signature(s) Signed: 01/31/2022 3:12:02 PM By: Sandre Kitty Signed: 01/31/2022 5:57:27 PM By: Levan Hurst RN, BSN Entered By: Sandre Kitty on 01/31/2022 14:52:52 -------------------------------------------------------------------------------- Vitals Details Patient Name: Date of Service: John Spell L. 01/31/2022 2:00 PM Medical Record Number: [id number] Patient Account Number: [id number] Date of Birth/Sex: Treating RN: Jul 26, 1951 (71 y.o. [name] Primary Care Jamoni Broadfoot: Claretta Fraise Other Clinician: Referring Francenia Chimenti: Treating Neelam Tiggs/Extender: Lenell Antu in Treatment: 0 Vital Signs Time Taken: 14:06 Temperature (F): 97.3 Height (in): 71 Pulse (bpm): 98 Source: Stated Respiratory Rate (breaths/min): 18 Weight (lbs): 180 Blood Pressure (mmHg): 96/64 Source: Stated Reference Range: 80 - 120 mg / dl Body Mass Index (BMI): 25.1 Electronic Signature(s) Signed: 01/31/2022  5:57:27 PM By: Levan Hurst RN, BSN Entered By: Levan Hurst on 01/31/2022 14:07:48

## 2022-01-31 NOTE — Telephone Encounter (Signed)
-----   Message from Rafael Bihari, [place] sent at 01/31/2022 12:25 PM EDT ----- BUN remains very high but creatinine down to 2.15. Will need BMET in 10-14 days to follow kidney function  Blood sugar 397. Please call and have wife re-check blood sugar, if they have a monitor. He needs PCP follow up to address.

## 2022-01-31 NOTE — Patient Instructions (Addendum)
Thank you for coming in today  Labs were done today, if any labs are abnormal the clinic will call you No news is good news  Your physician recommends that you schedule a follow-up appointment in:  Please keep follow up with Dr. [name]    Do the following things EVERYDAY: Weigh yourself in the morning before breakfast. Write it down and keep it in a log. Take your medicines as prescribed Eat low salt foods--Limit salt (sodium) to 2000 mg per day.  Stay as active as you can everyday Limit all fluids for the day to less than 2 liters  At the [facility], you and your health needs are our priority. As part of our continuing mission to provide you with exceptional heart care, we have created designated Provider Care Teams. These Care Teams include your primary Cardiologist (physician) and Advanced Practice Providers (APPs- Physician Assistants and Nurse Practitioners) who all work together to provide you with the care you need, when you need it.   You may see any of the following providers on your designated Care Team at your next follow up: Dr Glori Bickers Dr Haynes Kerns, NP Lyda Jester, [place] [facility] [place], [place] Audry Riles, PharmD   Please be sure to bring in all your medications bottles to every appointment.   If you have any questions or concerns before your next appointment please send [place] a message through [place] or call our office at [phone].    TO LEAVE A MESSAGE FOR THE NURSE SELECT OPTION 2, PLEASE LEAVE A MESSAGE INCLUDING: YOUR NAME DATE OF BIRTH CALL BACK NUMBER REASON FOR CALL**this is important as we prioritize the call backs  YOU WILL RECEIVE A CALL BACK THE SAME DAY AS LONG AS YOU CALL BEFORE 4:00 PM

## 2022-01-31 NOTE — Telephone Encounter (Signed)
I attempted to reach patient to review results and recomendations per provider.  I left a message for a return call.

## 2022-01-31 NOTE — Progress Notes (Signed)
John [name] ([id number]) , Visit Report for 01/31/2022 Abuse Risk Screen Details Patient Name: Date of Service: John [name] 01/31/2022 2:00 PM Medical Record Number: [id number] Patient Account Number: [id number] Date of Birth/Sex: Treating RN: 01/14/1951 (71 y.o. John [name] Primary Care John [name]: John [name] Other Clinician: Referring John [name]: Treating John [name]/Extender: John [name] in Treatment: 0 Abuse Risk Screen Items Answer ABUSE RISK SCREEN: Has anyone close to you tried to hurt or harm you recentlyo No Do you feel uncomfortable with anyone in your familyo No Has anyone forced you do things that you didnt want to doo No Electronic Signature(s) Signed: 01/31/2022 5:57:27 PM By: Levan Hurst RN, BSN Entered By: Levan Hurst on 01/31/2022 14:42:29 -------------------------------------------------------------------------------- Activities of Daily Living Details Patient Name: Date of Service: John [name] 01/31/2022 2:00 PM Medical Record Number: [id number] Patient Account Number: [id number] Date of Birth/Sex: Treating RN: [birth year] (71 y.o. John [name] Primary Care John [name]: John [name] Other Clinician: Referring John [name]: Treating John [name]/Extender: John [name] in Treatment: 0 Activities of Daily Living Items Answer Activities of Daily Living (Please select one for each item) Drive Automobile Need Assistance T Medications ake Completely Able Use T elephone Completely Able Care for Appearance Completely Able Use T oilet Completely Able Bath / Shower Need Assistance Dress Self Completely Able Feed Self Completely Able Walk Completely Able Get In / Out Bed Completely Able Housework Need Assistance Prepare Meals Need Assistance Handle Money Completely Able Shop for Self Need Assistance Electronic Signature(s) Signed: 01/31/2022 5:57:27 PM By: Levan Hurst RN,  BSN Entered By: Levan Hurst on 01/31/2022 14:43:40 -------------------------------------------------------------------------------- Education Screening Details Patient Name: Date of Service: John Spell L. 01/31/2022 2:00 PM Medical Record Number: [id number] Patient Account Number: [id number] Date of Birth/Sex: Treating RN: 06/19/1951 (71 y.o. John [name] Primary Care John [name]: John [name] Other Clinician: Referring John [name]: Treating John [name]/Extender: John [name] in Treatment: 0 Primary Learner Assessed: Patient Learning Preferences/Education Level/Primary Language Learning Preference: Explanation, Demonstration, Printed Material Highest Education Level: College or Above Preferred Language: English Cognitive Barrier Language Barrier: No Translator Needed: No Memory Deficit: No Emotional Barrier: No Cultural/Religious Beliefs Affecting Medical Care: No Physical Barrier Impaired Vision: No Impaired Hearing: No Decreased Hand dexterity: No Knowledge/Comprehension Knowledge Level: High Comprehension Level: High Ability to understand written instructions: High Ability to understand verbal instructions: High Motivation Anxiety Level: Calm Cooperation: Cooperative Education Importance: Acknowledges Need Interest in Health Problems: Asks Questions Perception: Coherent Willingness to Engage in Self-Management High Activities: Readiness to Engage in Self-Management High Activities: Electronic Signature(s) Signed: 01/31/2022 5:57:27 PM By: Levan Hurst RN, BSN Entered By: Levan Hurst on 01/31/2022 14:44:03 -------------------------------------------------------------------------------- Fall Risk Assessment Details Patient Name: Date of Service: John [name]. 01/31/2022 2:00 PM Medical Record Number: [id number] Patient Account Number: [id number] Date of Birth/Sex: Treating RN: 13-May-1951 (70 y.o. John [name] Primary Care John [name]: John [name] Other Clinician: Referring John [name]: Treating John [name]/Extender: John [name] in Treatment: 0 Fall Risk Assessment Items Have you had 2 or more falls in the last 12 monthso 0 No Have you had any fall that resulted in injury in the last 12 monthso 0 No FALLS RISK SCREEN History of falling - immediate or within 3 months 0 No Secondary diagnosis (Do you have 2 or more medical diagnoseso) 15 Yes Ambulatory aid None/bed rest/wheelchair/nurse 0 Yes Crutches/cane/walker 0 No Furniture 0 No Intravenous therapy Access/Saline/Heparin Lock 0 No Gait/Transferring Normal/ bed rest/ wheelchair  0 Yes Weak (short steps with or without shuffle, stooped but able to lift head while walking, may seek 0 No support from furniture) Impaired (short steps with shuffle, may have difficulty arising from chair, head down, impaired 0 No balance) Mental Status Oriented to own ability 0 Yes Electronic Signature(s) Signed: 01/31/2022 5:57:27 PM By: Levan Hurst RN, BSN Entered By: Levan Hurst on 01/31/2022 14:44:13 -------------------------------------------------------------------------------- Foot Assessment Details Patient Name: Date of Service: John Spell L. 01/31/2022 2:00 PM Medical Record Number: [id number] Patient Account Number: [id number] Date of Birth/Sex: Treating RN: [birth year] (71 y.o. John [name] Primary Care John [name]: John [name] Other Clinician: Referring John [name]: Treating John [name]/Extender: John [name] in Treatment: 0 Foot Assessment Items Site Locations + = Sensation present, - = Sensation absent, C = Callus, U = Ulcer R = Redness, W = Warmth, M = Maceration, PU = Pre-ulcerative lesion F = Fissure, S = Swelling, D = Dryness Assessment Right: Left: Other Deformity: No No Prior Foot Ulcer: No No Prior Amputation: No No Charcot Joint: No No Ambulatory Status:  Ambulatory Without Help Gait: Steady Electronic Signature(s) Signed: 01/31/2022 5:57:27 PM By: Levan Hurst RN, BSN Entered By: Levan Hurst on 01/31/2022 14:44:53 -------------------------------------------------------------------------------- Nutrition Risk Screening Details Patient Name: Date of Service: John [name] 01/31/2022 2:00 PM Medical Record Number: [id number] Patient Account Number: [id number] Date of Birth/Sex: Treating RN: [birth year] (71 y.o. John [name] Primary Care Quantez Schnyder: John [name] Other Clinician: Referring Branston Halsted: Treating Shanora Christensen/Extender: John [name] in Treatment: 0 Height (in): 71 Weight (lbs): 180 Body Mass Index (BMI): 25.1 Nutrition Risk Screening Items Score Screening NUTRITION RISK SCREEN: I have an illness or condition that made me change the kind and/or amount of food I eat 2 Yes I eat fewer than two meals per day 0 No I eat few fruits and vegetables, or milk products 0 No I have three or more drinks of beer, liquor or wine almost every day 0 No I have tooth or mouth problems that make it hard for me to eat 0 No I don't always have enough money to buy the food I need 0 No I eat alone most of the time 0 No I take three or more different prescribed or over-the-counter drugs a day 1 Yes Without wanting to, I have lost or gained 10 pounds in the last six months 0 No I am not always physically able to shop, cook and/or feed myself 2 Yes Nutrition Protocols Good Risk Protocol Moderate Risk Protocol 0 Provide education on nutrition High Risk Proctocol Risk Level: Moderate Risk Score: 5 Electronic Signature(s) Signed: 01/31/2022 5:57:27 PM By: Levan Hurst RN, BSN Entered By: Levan Hurst on 01/31/2022 14:44:28

## 2022-01-31 NOTE — Progress Notes (Signed)
[name] ([id number]) , Visit Report for 01/31/2022 Problem List Details Patient Name: Date of Service: John [name] 01/31/2022 2:00 PM Medical Record Number: [id number] Patient Account Number: [id number] Date of Birth/Sex: Treating RN: 1951/07/20 (70 y.o. 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

## 2022-02-01 ENCOUNTER — Encounter: Payer: Self-pay | Admitting: Family Medicine

## 2022-02-01 ENCOUNTER — Ambulatory Visit (INDEPENDENT_AMBULATORY_CARE_PROVIDER_SITE_OTHER): Payer: PPO | Admitting: Family Medicine

## 2022-02-01 VITALS — BP 90/57 | HR 91 | Temp 97.4°F | Ht 71.0 in | Wt 181.8 lb

## 2022-02-01 DIAGNOSIS — I5032 Chronic diastolic (congestive) heart failure: Secondary | ICD-10-CM

## 2022-02-01 DIAGNOSIS — I519 Heart disease, unspecified: Secondary | ICD-10-CM | POA: Diagnosis not present

## 2022-02-01 DIAGNOSIS — Z7901 Long term (current) use of anticoagulants: Secondary | ICD-10-CM | POA: Diagnosis not present

## 2022-02-01 DIAGNOSIS — Z95810 Presence of automatic (implantable) cardiac defibrillator: Secondary | ICD-10-CM | POA: Diagnosis not present

## 2022-02-01 DIAGNOSIS — I4819 Other persistent atrial fibrillation: Secondary | ICD-10-CM

## 2022-02-01 DIAGNOSIS — Z9889 Other specified postprocedural states: Secondary | ICD-10-CM | POA: Diagnosis not present

## 2022-02-01 DIAGNOSIS — I350 Nonrheumatic aortic (valve) stenosis: Secondary | ICD-10-CM | POA: Diagnosis not present

## 2022-02-01 DIAGNOSIS — I872 Venous insufficiency (chronic) (peripheral): Secondary | ICD-10-CM | POA: Diagnosis not present

## 2022-02-01 LAB — COAGUCHEK XS/INR WAIVED
INR: 1.4 — ABNORMAL HIGH (ref 0.9–1.1)
Prothrombin Time: 17.3 s

## 2022-02-01 MED ORDER — TORSEMIDE 20 MG PO TABS: ORAL_TABLET | ORAL | 6 refills | Status: DC

## 2022-02-01 NOTE — Progress Notes (Signed)
Subjective:  Patient ID: John [name], male    DOB: Feb 05, 1951  Age: 71 y.o. MRN: [id number]  CC: Atrial Fibrillation   HPI John [name] presents for Atrial fibrillation follow up. Pt. is treated with rate control and anticoagulation. 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 He also has an artificial mitral valve. Pt.  denies palpitations, rapid rate, chest pain, dyspnea and edema. There has been no bleeding from nose or gums. Pt. has not noticed blood with urine or stool.  Although there is routine bruising easily, it is not excessive.  Pt. Up a lot at night to go to urinate. Recently placed on higher dose of torsemide that includes 60 mg in the evening.      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   He has a past surgical history that includes Mitral valve replacement (11/2000); laparotomy (N/A, 02/09/2013); Portacath placement (N/A, 02/09/2013); Coronary artery bypass graft (2002); Septoplasty; Application if wound vac (2014); Colonoscopy;  Esophagogastroduodenoscopy; Laparoscopic incisional / umbilical / ventral hernia repair (03/15/2014); Port-a-cath removal (03/15/2014); Hernia repair; Cardiac catheterization (2002); Incisional hernia repair (N/A, 03/15/2014); Port-a-cath removal (Left, 03/15/2014); Insertion of mesh (N/A, 03/15/2014); RIGHT/LEFT HEART CATH AND CORONARY/GRAFT ANGIOGRAPHY (N/A, 05/10/2017); Coronary artery bypass graft; Small intestine surgery; ICD IMPLANT (N/A, 02/19/2020); RIGHT/LEFT HEART CATH AND CORONARY/GRAFT ANGIOGRAPHY (N/A, 06/23/2020); CORONARY STENT INTERVENTION (N/A, 06/24/2020); CORONARY ATHERECTOMY (N/A, 06/24/2020); CORONARY BALLOON ANGIOPLASTY (N/A, 06/24/2020); TEE without cardioversion (N/A, 11/21/2021); and RIGHT AND LEFT HEART CATH (N/A, 11/27/2021).   His family history includes Arthritis in his brother; Depression in his brother; Diabetes in his brother, brother, and mother; Heart disease in his brother and brother; Hyperlipidemia in his brother and brother; Hypertension in his brother, brother, and father; Thyroid disease in his sister.He reports that he has quit smoking. His smoking use included cigars. He has never used smokeless tobacco. He reports that he does not drink alcohol and does not use drugs.    ROS Review of Systems  Constitutional:  Negative for fever.  Respiratory:  Negative for shortness of breath.   Cardiovascular:  Negative for chest pain.  Musculoskeletal:  Negative for arthralgias.  Skin:  Negative for rash.  Psychiatric/Behavioral:  Positive for sleep disturbance (due to nocturia X 3+).    Objective:  BP (!) 90/57   Pulse 91   Temp (!) 97.4 F (36.3 C)   Ht '5\' 11"'$  (1.803 m)   Wt 181 lb 12.8 oz (82.5 kg)   SpO2 99%   BMI 25.36 kg/m   BP Readings from Last 3 Encounters:  02/01/22 (!) 90/57  01/31/22 102/64  01/23/22 112/68    Wt Readings from Last 3 Encounters:  02/01/22 181 lb  12.8 oz (82.5 kg)  01/31/22 180 lb 3.2 oz (81.7 kg)  01/23/22 190 lb 6.4 oz (86.4 kg)      Physical Exam Vitals reviewed.  Constitutional:      Appearance: He is well-developed. He is ill-appearing.  HENT:     Head: Normocephalic and atraumatic.     Right Ear: External ear normal.     Left Ear: External ear normal.     Mouth/Throat:     Pharynx: No oropharyngeal exudate or posterior oropharyngeal erythema.  Eyes:     Pupils: Pupils are equal, round, and reactive to light.  Cardiovascular:     Rate and Rhythm: Normal rate and regular rhythm.     Heart sounds: No murmur heard. Pulmonary:     Effort: No respiratory distress.     Breath sounds: Normal breath sounds.  Musculoskeletal:     Cervical back: Normal range of motion and neck supple.  Neurological:     Mental Status: He is alert and oriented to person, place, and time.      Assessment & Plan:   John [name] was seen today for atrial fibrillation.  Diagnoses and all orders for this visit:  Persistent atrial fibrillation ([place]) -     CoaguChek XS/INR Waived  Long term (current) use of anticoagulants  Chronic systolic dysfunction of left ventricle  Chronic diastolic HF (heart failure) (HCC)  ICD (implantable cardioverter-defibrillator) in place  Aortic valve stenosis, etiology of cardiac valve disease unspecified  MITRAL VALVE REPLACEMENT, HX OF  Other orders -     torsemide (DEMADEX) 20 MG tablet; Patient takes 80 mg by mouth in the morning and 40 mg in the early to mid afternoon.       I have changed John [name]'s torsemide. I am also having him maintain his insulin glargine, sodium bicarbonate, OneTouch Delica Lancets 33I, warfarin, clopidogrel, glipiZIDE, pantoprazole, OneTouch Verio, Januvia, rosuvastatin, nitroGLYCERIN, metoprolol succinate, and cephALEXin.  Allergies as of 02/01/2022       Reactions   Allopurinol Shortness Of Breath   Doxycycline Hives   Farxiga [dapagliflozin]    Can not tolerate - dizzy weak almost passed out        Medication List        Accurate as  of Feb 01, 2022  4:31 PM. If you have any questions, ask your nurse or doctor.          cephALEXin 500 MG capsule Commonly known as: Keflex Take 1 capsule (500 mg total) by mouth 4 (four) times daily.   clopidogrel 75 MG tablet Commonly known as: PLAVIX TAKE 1 TABLET BY MOUTH DAILY WITH BREAKFAST.   glipiZIDE 10 MG 24 hr tablet Commonly known as: GLUCOTROL XL Take 1 tablet (10 mg total) by mouth daily.   insulin glargine 100 UNIT/ML injection Commonly known as: LANTUS Inject 0.7 mLs (70 Units total) into the skin at bedtime. What changed:  when to take this additional instructions   Januvia 50 MG tablet Generic drug: sitaGLIPtin Take 0.5 tablets (25 mg total) by mouth daily.   metoprolol succinate 25 MG 24 hr tablet Commonly known as: TOPROL-XL Take 1 tablet (25 mg total) by mouth daily.   nitroGLYCERIN 0.4 MG SL tablet Commonly known as: NITROSTAT Place 1 tablet (0.4 mg total) under the tongue every 5 (five) minutes as needed for chest pain.   OneTouch Delica Lancets 95J Misc TEST 2 TIMES DAILY AS DIRECTED   OneTouch Verio test strip Generic drug: glucose blood USE TO TEST TWICE  A DAY   pantoprazole 40 MG tablet Commonly known as: PROTONIX Take 1 tablet (40 mg total) by mouth daily.   rosuvastatin 20 MG tablet Commonly known as: CRESTOR 20 mg. Patient takes 1 tablet by mouth every other day.   sodium bicarbonate 650 MG tablet Take 650 mg by mouth 2 (two) times daily.   torsemide 20 MG tablet Commonly known as: DEMADEX Patient takes 80 mg by mouth in the morning and 40 mg in the early to mid afternoon. What changed:  how much to take how to take this when to take this additional instructions Changed by: Claretta Fraise, MD   warfarin 5 MG tablet Commonly known as: COUMADIN Take as directed by the anticoagulation clinic. If you are unsure how to take this medication, talk to your nurse or doctor. Original instructions: Take 1 tablet (5 mg total) by  mouth daily. What changed: additional instructions       Change coumadin to 2.5 mg daily 1/2 tablet, except take a whole one today.    Follow-up: Return in about 1 month (around 2022/03/27).  Claretta Fraise, M.D.

## 2022-02-02 ENCOUNTER — Telehealth (HOSPITAL_COMMUNITY): Payer: Self-pay | Admitting: *Deleted

## 2022-02-02 DIAGNOSIS — I5022 Chronic systolic (congestive) heart failure: Secondary | ICD-10-CM

## 2022-02-02 NOTE — Telephone Encounter (Signed)
John [name], [place]  02/02/2022 11:30 AM EDT Back to Top    Pts wife returned call she is aware of results. She said his glucose has not been that high at home and that they check it twice daily. Lab appt scheduled.    Emer Lilia Pro, RN  01/31/2022  3:27 PM EDT     Forwarded results to PCP to address high blood sugar   Micki Riley, RN  01/31/2022  1:16 PM EDT     I attempted to reach patient to review results and recomendations per provider.  I left a message for a return call.   [place], FNP  01/31/2022 12:25 PM EDT     BUN remains very high but creatinine down to 2.15. Will need BMET in 10-14 days to follow kidney function   Blood sugar 397. Please call and have wife re-check blood sugar, if they have a monitor. He needs PCP follow up to address.

## 2022-02-02 NOTE — Telephone Encounter (Signed)
-----   Message from Rafael Bihari, [place] sent at 01/31/2022 12:25 PM EDT ----- BUN remains very high but creatinine down to 2.15. Will need BMET in 10-14 days to follow kidney function  Blood sugar 397. Please call and have wife re-check blood sugar, if they have a monitor. He needs PCP follow up to address.

## 2022-02-06 ENCOUNTER — Ambulatory Visit (INDEPENDENT_AMBULATORY_CARE_PROVIDER_SITE_OTHER): Payer: HMO

## 2022-02-06 DIAGNOSIS — Z95810 Presence of automatic (implantable) cardiac defibrillator: Secondary | ICD-10-CM | POA: Diagnosis not present

## 2022-02-06 DIAGNOSIS — I5022 Chronic systolic (congestive) heart failure: Secondary | ICD-10-CM

## 2022-02-07 ENCOUNTER — Telehealth: Payer: Self-pay

## 2022-02-07 NOTE — Progress Notes (Signed)
EPIC Encounter for ICM Monitoring  Patient Name: John [name] is a 71 y.o. male Date: 02/07/2022 Primary Care Physican: Claretta Fraise, MD Primary Cardiologist: [facility] Electrophysiologist: Lovena Le 01/02/2022 Office Weight: 181 lbs                                                             Attempted call to wife, per DPR and unable to reach.  Left detailed message per DPR regarding transmission.  Transmission reviewed.   CorVue thoracic impedance suggesting possible dryness starting 5/20 and returned to baseline 5/29.   Prescribed:  Torsemide 20 mg Take 4 tablets (80 mg total) by mouth every morning and 2 tablets (40 mg total) by mouth in the afternoon   Labs: 01/31/2022 Creatinine 2.15, BUN 146, Potassium 4.3, Sodium 133, GFR 33 01/23/2022 Creatinine 2.40, BUN 122, Potassium 4.4, Sodium 137, GFR 29  12/18/2021 Creatinine 2.05, BUN 84, Potassium 3.5, Sodium 144, GFR 35 12/13/2021 Creatinine 2.47, BUN 103, Potassium 3.7, Sodium 137  12/05/2021 Creatinine 2.45, BUN 161, Potassium 4.4, Sodium 143, GFR 28  12/02/2021 Creatinine 2.60, BUN 125, Potassium 3.8, Sodium 135  12/01/2021 Creatinine 2.85, BUN 120, Potassium 4.1, Sodium 132  11/30/2021 Creatinine 2.88, BUN 107, Potassium 4.3, Sodium 136  11/29/2021 Creatinine 2.69, BUN 100, Potassium 4.4, Sodium 132  11/28/2021 Creatinine 2.68, BUN 102, Potassium 3.7, Sodium 137  A complete set of results can be found in Results Review.   Recommendations: Unable to reach.     Follow-up plan: ICM clinic phone appointment on 03/12/2022.   91 day device clinic remote transmission 03/05/2022.     EP/Cardiology Office Visits:  02/22/2022 with Dr. Percival Spanish.  03/23/2022 with Dr [name].     Copy of ICM check sent to Dr. Lovena Le.  3 month ICM trend: 02/07/2022.    12-14 Month ICM trend:     Rosalene Billings, RN 02/07/2022 4:34 PM

## 2022-02-07 NOTE — Telephone Encounter (Signed)
Remote ICM transmission received.  Attempted call to wife regarding ICM remote transmission and left detailed message per DPR.  Advised to return call for any fluid symptoms or questions. Next ICM remote transmission scheduled 03/12/2022.

## 2022-02-08 ENCOUNTER — Encounter (HOSPITAL_BASED_OUTPATIENT_CLINIC_OR_DEPARTMENT_OTHER): Payer: PPO | Attending: General Surgery | Admitting: General Surgery

## 2022-02-08 DIAGNOSIS — E1142 Type 2 diabetes mellitus with diabetic polyneuropathy: Secondary | ICD-10-CM | POA: Diagnosis not present

## 2022-02-08 DIAGNOSIS — L97822 Non-pressure chronic ulcer of other part of left lower leg with fat layer exposed: Secondary | ICD-10-CM | POA: Diagnosis not present

## 2022-02-08 DIAGNOSIS — Z79899 Other long term (current) drug therapy: Secondary | ICD-10-CM | POA: Insufficient documentation

## 2022-02-08 DIAGNOSIS — E11621 Type 2 diabetes mellitus with foot ulcer: Secondary | ICD-10-CM | POA: Insufficient documentation

## 2022-02-08 DIAGNOSIS — I890 Lymphedema, not elsewhere classified: Secondary | ICD-10-CM | POA: Insufficient documentation

## 2022-02-08 DIAGNOSIS — N183 Chronic kidney disease, stage 3 unspecified: Secondary | ICD-10-CM | POA: Insufficient documentation

## 2022-02-08 DIAGNOSIS — L97812 Non-pressure chronic ulcer of other part of right lower leg with fat layer exposed: Secondary | ICD-10-CM | POA: Insufficient documentation

## 2022-02-08 DIAGNOSIS — I130 Hypertensive heart and chronic kidney disease with heart failure and stage 1 through stage 4 chronic kidney disease, or unspecified chronic kidney disease: Secondary | ICD-10-CM | POA: Diagnosis not present

## 2022-02-08 DIAGNOSIS — E1122 Type 2 diabetes mellitus with diabetic chronic kidney disease: Secondary | ICD-10-CM | POA: Diagnosis not present

## 2022-02-08 DIAGNOSIS — I482 Chronic atrial fibrillation, unspecified: Secondary | ICD-10-CM | POA: Diagnosis not present

## 2022-02-08 DIAGNOSIS — L97222 Non-pressure chronic ulcer of left calf with fat layer exposed: Secondary | ICD-10-CM | POA: Diagnosis not present

## 2022-02-08 DIAGNOSIS — I5042 Chronic combined systolic (congestive) and diastolic (congestive) heart failure: Secondary | ICD-10-CM | POA: Diagnosis not present

## 2022-02-08 DIAGNOSIS — I872 Venous insufficiency (chronic) (peripheral): Secondary | ICD-10-CM | POA: Diagnosis not present

## 2022-02-08 NOTE — Progress Notes (Signed)
John [name] ([id number]) , Visit Report for 02/08/2022 Arrival Information Details Patient Name: Date of Service: John [name] 02/08/2022 9:00 A M Medical Record Number: [id number] Patient Account Number: [id number] Date of Birth/Sex: Treating RN: 1951/08/07 (71 y.o. John [name] Primary Care John [name]: John [name] Other Clinician: Referring John [name]: Treating John [name]/Extender: John [name] in Treatment: 1 Visit Information History Since Last Visit Added or deleted any medications: No Patient Arrived: Ambulatory Any new allergies or adverse reactions: No Arrival Time: 08:49 Had a fall or experienced change in No Accompanied By: wife activities of daily living that may affect Transfer Assistance: None risk of falls: Patient Identification Verified: Yes Signs or symptoms of abuse/neglect since last visito No Secondary Verification Process Completed: Yes Hospitalized since last visit: No Patient Has Alerts: Yes Implantable device outside of the clinic excluding No Patient Alerts: Patient on Blood Thinner cellular tissue based products placed in the center ABIs non comp bilat since last visit: Has Dressing in Place as Prescribed: Yes Has Compression in Place as Prescribed: Yes Pain Present Now: Yes Electronic Signature(s) Signed: 02/08/2022 6:12:29 PM By: John Hurst RN, BSN Entered By: John [name] on 02/08/2022 08:49:51 -------------------------------------------------------------------------------- Encounter Discharge Information Details Patient Name: Date of Service: John Spell L. 02/08/2022 9:00 A M Medical Record Number: [id number] Patient Account Number: [id number] Date of Birth/Sex: Treating RN: 24-Mar-1951 (71 y.o. John [name] Primary Care John [name]: John [name] Other Clinician: Referring John [name]: Treating John [name]/Extender: John [name] in Treatment: 1 Encounter Discharge  Information Items Post Procedure Vitals Discharge Condition: Stable Temperature (F): 97.2 Ambulatory Status: Wheelchair Pulse (bpm): 79 Discharge Destination: Home Respiratory Rate (breaths/min): 18 Transportation: Private Auto Blood Pressure (mmHg): 89/62 Accompanied By: wife Schedule Follow-up Appointment: Yes Clinical Summary of Care: Patient Declined Electronic Signature(s) Signed: 02/08/2022 6:12:29 PM By: John Hurst RN, BSN Entered By: John [name] on 02/08/2022 18:05:03 -------------------------------------------------------------------------------- Lower Extremity Assessment Details Patient Name: Date of Service: John Spell L. 02/08/2022 9:00 A M Medical Record Number: [id number] Patient Account Number: [id number] Date of Birth/Sex: Treating RN: 03-18-1951 (71 y.o. John [name] Primary Care Aaira Oestreicher: John [name] Other Clinician: Referring Chane Magner: Treating John [name]/Extender: John [name] in Treatment: 1 Edema Assessment Assessed: John [name]: No] John [name]: No] Edema: [Left: Yes] [Right: Yes] Calf Left: Right: Point of Measurement: 36 cm From Medial Instep 31 cm 30 cm Ankle Left: Right: Point of Measurement: 12 cm From Medial Instep 21.5 cm 23.2 cm Vascular Assessment Pulses: Dorsalis Pedis Palpable: [Left:Yes] [Right:Yes] Electronic Signature(s) Signed: 02/08/2022 6:12:29 PM By: John Hurst RN, BSN Entered By: John [name] on 02/08/2022 08:57:43 -------------------------------------------------------------------------------- Multi Wound Chart Details Patient Name: Date of Service: John Spell L. 02/08/2022 9:00 A M Medical Record Number: [id number] Patient Account Number: [id number] Date of Birth/Sex: Treating RN: 1951/04/13 (71 y.o. John [name] Primary Care Lavera Vandermeer: John [name] Other Clinician: Referring Kayman Snuffer: Treating John [name]/Extender: John [name] in Treatment: 1 Vital  Signs Height(in): 16 Pulse(bpm): 36 Weight(lbs): 180 Blood Pressure(mmHg): 89/62 Body Mass Index(BMI): 25.1 Temperature(F): 97.2 Respiratory Rate(breaths/min): 18 Photos: Left, Anterior Lower Leg Left, Medial Lower Leg Left, Proximal, Posterior Lower Leg Wound Location: Blister Blister Blister Wounding Event: Venous Leg Ulcer Venous Leg Ulcer Venous Leg Ulcer Primary Etiology: Glaucoma, Anemia, Arrhythmia, Glaucoma, Anemia, Arrhythmia, Glaucoma, Anemia, Arrhythmia, Comorbid History: Congestive Heart Failure, Coronary Congestive Heart Failure, Coronary Congestive Heart Failure, Coronary Artery Disease, Hypertension, Artery Disease, Hypertension, Artery Disease, Hypertension, Peripheral Venous Disease, Type II Peripheral  Venous Disease, Type II Peripheral Venous Disease, Type II Diabetes, Osteoarthritis Diabetes, Osteoarthritis Diabetes, Osteoarthritis 01/24/2022 01/24/2022 01/24/2022 Date Acquired: '1 1 1 '$ Weeks of Treatment: Open Open Open Wound Status: No No No Wound Recurrence: No No No Clustered Wound: N/A N/A N/A Clustered Quantity: 6x9x0.1 2.9x4.3x0.1 1.1x1x0.1 Measurements L x W x D (cm) 42.412 9.794 0.864 A (cm) : rea 4.241 0.979 0.086 Volume (cm) : -16.40% 7.60% 65.60% % Reduction in A rea: -16.40% 7.60% 65.70% % Reduction in Volume: Full Thickness Without Exposed Full Thickness Without Exposed Full Thickness Without Exposed Classification: Support Structures Support Structures Support Structures Medium Large Medium Exudate A mount: Serosanguineous Serous Serosanguineous Exudate Type: red, brown amber red, brown Exudate Color: Flat and Intact Flat and Intact Flat and Intact Wound Margin: Large (67-100%) Large (67-100%) Large (67-100%) Granulation A mount: Red, Pink Pink Red, Pink Granulation Quality: None Present (0%) Small (1-33%) Small (1-33%) Necrotic A mount: Fat Layer (Subcutaneous Tissue): Yes Fat Layer (Subcutaneous Tissue): Yes Fat Layer  (Subcutaneous Tissue): Yes Exposed Structures: Fascia: No Fascia: No Fascia: No Tendon: No Tendon: No Tendon: No Muscle: No Muscle: No Muscle: No Joint: No Joint: No Joint: No Bone: No Bone: No Bone: No Small (1-33%) Small (1-33%) Small (1-33%) Epithelialization: N/A N/A Debridement - Selective/Open Wound Debridement: Pre-procedure Verification/Time Out N/A N/A 09:14 Taken: N/A N/A Slough Tissue Debrided: N/A N/A Non-Viable Tissue Level: N/A N/A 1.1 Debridement A (sq cm): rea N/A N/A Curette Instrument: N/A N/A Minimum Bleeding: N/A N/A Pressure Hemostasis A chieved: N/A N/A 0 Procedural Pain: N/A N/A 0 Post Procedural Pain: N/A N/A Procedure was tolerated well Debridement Treatment Response: N/A N/A 1.1x1x0.1 Post Debridement Measurements L x W x D (cm) N/A N/A 0.086 Post Debridement Volume: (cm) N/A N/A Debridement Procedures Performed: Wound Number: 4 5 N/A Photos: No Photos N/A Left, Distal, Posterior Lower Leg Right, Circumferential Lower Leg N/A Wound Location: Blister Blister N/A Wounding Event: Venous Leg Ulcer Venous Leg Ulcer N/A Primary Etiology: Glaucoma, Anemia, Arrhythmia, Glaucoma, Anemia, Arrhythmia, N/A Comorbid History: Congestive Heart Failure, Coronary Congestive Heart Failure, Coronary Artery Disease, Hypertension, Artery Disease, Hypertension, Peripheral Venous Disease, Type II Peripheral Venous Disease, Type II Diabetes, Osteoarthritis Diabetes, Osteoarthritis 01/24/2022 01/24/2022 N/A Date Acquired: 1 1 N/A Weeks of Treatment: Converted Open N/A Wound Status: No No N/A Wound Recurrence: No Yes N/A Clustered Wound: N/A 9 N/A Clustered Quantity: 0x0x0 12.2x17x0.1 N/A Measurements L x W x D (cm) 0 162.892 N/A A (cm) : rea 0 16.289 N/A Volume (cm) : 100.00% 52.30% N/A % Reduction in Area: 100.00% 52.30% N/A % Reduction in Volume: Full Thickness Without Exposed Full Thickness Without Exposed  N/A Classification: Support Structures Support Structures None Present Large N/A Exudate Amount: N/A Serous N/A Exudate Type: N/A amber N/A Exudate Color: Flat and Intact Flat and Intact N/A Wound Margin: None Present (0%) Large (67-100%) N/A Granulation Amount: N/A Red, Pink N/A Granulation Quality: None Present (0%) Small (1-33%) N/A Necrotic Amount: Fascia: No Fat Layer (Subcutaneous Tissue): Yes N/A Exposed Structures: Fat Layer (Subcutaneous Tissue): No Fascia: No Tendon: No Tendon: No Muscle: No Muscle: No Joint: No Joint: No Bone: No Bone: No Large (67-100%) Small (1-33%) N/A Epithelialization: N/A Debridement - Selective/Open Wound N/A Debridement: Pre-procedure Verification/Time Out N/A 09:14 N/A Taken: N/A Necrotic/Eschar N/A Tissue Debrided: N/A Skin/Epidermis N/A Level: N/A 15 N/A Debridement A (sq cm): rea N/A Curette N/A Instrument: N/A Minimum N/A Bleeding: N/A Pressure N/A Hemostasis A chieved: N/A 0 N/A Procedural Pain: N/A 0 N/A Post Procedural Pain:  N/A Procedure was tolerated well N/A Debridement Treatment Response: N/A 12.2x17x0.1 N/A Post Debridement Measurements L x W x D (cm) N/A 16.289 N/A Post Debridement Volume: (cm) N/A Debridement N/A Procedures Performed: Treatment Notes Electronic Signature(s) Signed: 02/08/2022 9:34:28 AM By: Fredirick Maudlin MD FACS Signed: 02/08/2022 6:12:29 PM By: John Hurst RN, BSN Entered By: Fredirick Maudlin on 02/08/2022 09:34:28 -------------------------------------------------------------------------------- Multi-Disciplinary Care Plan Details Patient Name: Date of Service: John Spell L. 02/08/2022 9:00 A M Medical Record Number: [id number] Patient Account Number: [id number] Date of Birth/Sex: Treating RN: January 01, 1951 (71 y.o. John [name] Primary Care Ellicia Alix: John [name] Other Clinician: Referring Davyd Podgorski: Treating Lucciana Head/Extender: John [name] in Treatment: 1 Multidisciplinary Care Plan reviewed with physician Active Inactive Abuse / Safety / Falls / Self Care Management Nursing Diagnoses: Potential for falls Potential for injury related to falls Goals: Patient will not experience any injury related to falls Date Initiated: 01/31/2022 Target Resolution Date: 03/02/2022 Goal Status: Active Patient/caregiver will verbalize/demonstrate measures taken to prevent injury and/or falls Date Initiated: 01/31/2022 Target Resolution Date: 03/02/2022 Goal Status: Active Interventions: Assess Activities of Daily Living upon admission and as needed Assess fall risk on admission and as needed Assess: immobility, friction, shearing, incontinence upon admission and as needed Assess impairment of mobility on admission and as needed per policy Assess personal safety and home safety (as indicated) on admission and as needed Assess self care needs on admission and as needed Provide education on personal and home safety Notes: Nutrition Nursing Diagnoses: Impaired glucose control: actual or potential Potential for alteratiion in Nutrition/Potential for imbalanced nutrition Goals: Patient/caregiver agrees to and verbalizes understanding of need to use nutritional supplements and/or vitamins as prescribed Date Initiated: 01/31/2022 Target Resolution Date: 03/02/2022 Goal Status: Active Patient/caregiver will maintain therapeutic glucose control Date Initiated: 01/31/2022 Target Resolution Date: 03/02/2022 Goal Status: Active Interventions: Assess HgA1c results as ordered upon admission and as needed Assess patient nutrition upon admission and as needed per policy Provide education on elevated blood sugars and impact on wound healing Provide education on nutrition Treatment Activities: Education provided on Nutrition : 01/31/2022 Notes: Venous Leg Ulcer Nursing Diagnoses: Knowledge deficit related to disease process and  management Goals: Patient will maintain optimal edema control Date Initiated: 01/31/2022 Target Resolution Date: 03/02/2022 Goal Status: Active Patient/caregiver will verbalize understanding of disease process and disease management Date Initiated: 01/31/2022 Target Resolution Date: 03/02/2022 Goal Status: Active Interventions: Assess peripheral edema status every visit. Compression as ordered Provide education on venous insufficiency Notes: Wound/Skin Impairment Nursing Diagnoses: Impaired tissue integrity Knowledge deficit related to ulceration/compromised skin integrity Goals: Patient/caregiver will verbalize understanding of skin care regimen Date Initiated: 01/31/2022 Target Resolution Date: 03/02/2022 Goal Status: Active Interventions: Assess patient/caregiver ability to obtain necessary supplies Assess patient/caregiver ability to perform ulcer/skin care regimen upon admission and as needed Assess ulceration(s) every visit Provide education on ulcer and skin care Notes: Electronic Signature(s) Signed: 02/08/2022 6:12:29 PM By: John Hurst RN, BSN Entered By: John [name] on 02/08/2022 09:08:33 -------------------------------------------------------------------------------- Pain Assessment Details Patient Name: Date of Service: John Spell L. 02/08/2022 9:00 A M Medical Record Number: [id number] Patient Account Number: [id number] Date of Birth/Sex: Treating RN: 08-23-1951 (71 y.o. John [name] Primary Care Marybella Ethier: John [name] Other Clinician: Referring Lenae Wherley: Treating Correna Meacham/Extender: John [name] in Treatment: 1 Active Problems Location of Pain Severity and Description of Pain Patient Has Paino Yes Site Locations Pain Location: Pain in Ulcers With Dressing Change: Yes Duration of the Pain. Constant /  Intermittento Intermittent Rate the pain. Current Pain Level: 4 Character of Pain Describe the Pain:  Burning Pain Management and Medication Current Pain Management: Medication: Yes Cold Application: No Rest: No Massage: No Activity: No T.E.N.S.: No Heat Application: No Leg drop or elevation: No Is the Current Pain Management Adequate: Adequate How does your wound impact your activities of daily livingo Sleep: No Bathing: No Appetite: No Relationship With Others: No Bladder Continence: No Emotions: No Bowel Continence: No Work: No Toileting: No Drive: No Dressing: No Hobbies: No Electronic Signature(s) Signed: 02/08/2022 6:12:29 PM By: John Hurst RN, BSN Entered By: John [name] on 02/08/2022 08:52:17 -------------------------------------------------------------------------------- Patient/Caregiver Education Details Patient Name: Date of Service: John [name] 6/1/2023andnbsp9:00 A M Medical Record Number: [id number] Patient Account Number: [id number] Date of Birth/Gender: Treating RN: [birth year] (71 y.o. John [name] Primary Care Physician: John [name] Other Clinician: Referring Physician: Treating Physician/Extender: John [name] in Treatment: 1 Education Assessment Education Provided To: Patient Education Topics Provided Wound/Skin Impairment: Methods: Explain/Verbal Responses: State content correctly Electronic Signature(s) Signed: 02/08/2022 6:12:29 PM By: John Hurst RN, BSN Entered By: John [name] on 02/08/2022 09:08:58 -------------------------------------------------------------------------------- Wound Assessment Details Patient Name: Date of Service: John Spell L. 02/08/2022 9:00 A M Medical Record Number: [id number] Patient Account Number: [id number] Date of Birth/Sex: Treating RN: [birth year] (71 y.o. John [name] Primary Care Oliviana Mcgahee: John [name] Other Clinician: Referring Candie Gintz: Treating Loula Marcella/Extender: John [name] in Treatment: 1 Wound  Status Wound Number: 1 Primary Venous Leg Ulcer Etiology: Wound Location: Left, Anterior Lower Leg Wound Open Wounding Event: Blister Status: Date Acquired: 01/24/2022 Comorbid Glaucoma, Anemia, Arrhythmia, Congestive Heart Failure, Coronary Weeks Of Treatment: 1 History: Artery Disease, Hypertension, Peripheral Venous Disease, Type II Clustered Wound: No Diabetes, Osteoarthritis Photos Wound Measurements Length: (cm) 6 Width: (cm) 9 Depth: (cm) 0.1 Area: (cm) 42.412 Volume: (cm) 4.241 % Reduction in Area: -16.4% % Reduction in Volume: -16.4% Epithelialization: Small (1-33%) Tunneling: No Undermining: No Wound Description Classification: Full Thickness Without Exposed Support Structures Wound Margin: Flat and Intact Exudate Amount: Medium Exudate Type: Serosanguineous Exudate Color: red, brown Foul Odor After Cleansing: No Slough/Fibrino No Wound Bed Granulation Amount: Large (67-100%) Exposed Structure Granulation Quality: Red, Pink Fascia Exposed: No Necrotic Amount: None Present (0%) Fat Layer (Subcutaneous Tissue) Exposed: Yes Tendon Exposed: No Muscle Exposed: No Joint Exposed: No Bone Exposed: No Treatment Notes Wound #1 (Lower Leg) Wound Laterality: Left, Anterior Cleanser Soap and Water Discharge Instruction: May shower and wash wound with dial antibacterial soap and water prior to dressing change. Wound Cleanser Discharge Instruction: Cleanse the wound with wound cleanser prior to applying a clean dressing using gauze sponges, not tissue or cotton balls. Peri-Wound Care Topical Primary Dressing Maxorb Extra Calcium Alginate Dressing, 4x4 in Discharge Instruction: Apply calcium alginate to wound bed as instructed Secondary Dressing ABD Pad, 8x10 Discharge Instruction: Apply over primary dressing as directed. Woven Gauze Sponge, Non-Sterile 4x4 in Discharge Instruction: Apply over primary dressing as directed. Secured With Elastic Bandage 4 inch  (ACE bandage) Discharge Instruction: Secure with ACE bandage as directed. Kerlix Roll Sterile, 4.5x3.1 (in/yd) Discharge Instruction: Secure with Kerlix as directed. Paper Tape, 2x10 (in/yd) Discharge Instruction: Secure dressing with tape as directed. Compression Wrap Compression Stockings Add-Ons Electronic Signature(s) Signed: 02/08/2022 6:12:29 PM By: John Hurst RN, BSN Entered By: John [name] on 02/08/2022 09:06:05 -------------------------------------------------------------------------------- Wound Assessment Details Patient Name: Date of Service: John Spell L. 02/08/2022 9:00 A M Medical Record Number: [id number]  Patient Account Number: [id number] Date of Birth/Sex: Treating RN: [birth year] (71 y.o. John [name] Primary Care Nelwyn Hebdon: John [name] Other Clinician: Referring Kamari Bilek: Treating Shantale Holtmeyer/Extender: John [name] in Treatment: 1 Wound Status Wound Number: 2 Primary Venous Leg Ulcer Etiology: Wound Location: Left, Medial Lower Leg Wound Open Wounding Event: Blister Status: Date Acquired: 01/24/2022 Comorbid Glaucoma, Anemia, Arrhythmia, Congestive Heart Failure, Coronary Weeks Of Treatment: 1 History: Artery Disease, Hypertension, Peripheral Venous Disease, Type II Clustered Wound: No Diabetes, Osteoarthritis Photos Wound Measurements Length: (cm) 2.9 Width: (cm) 4.3 Depth: (cm) 0.1 Area: (cm) 9.794 Volume: (cm) 0.979 % Reduction in Area: 7.6% % Reduction in Volume: 7.6% Epithelialization: Small (1-33%) Tunneling: No Undermining: No Wound Description Classification: Full Thickness Without Exposed Support Structures Wound Margin: Flat and Intact Exudate Amount: Large Exudate Type: Serous Exudate Color: amber Foul Odor After Cleansing: No Slough/Fibrino Yes Wound Bed Granulation Amount: Large (67-100%) Exposed Structure Granulation Quality: Pink Fascia Exposed: No Necrotic Amount: Small (1-33%) Fat  Layer (Subcutaneous Tissue) Exposed: Yes Necrotic Quality: Adherent Slough Tendon Exposed: No Muscle Exposed: No Joint Exposed: No Bone Exposed: No Treatment Notes Wound #2 (Lower Leg) Wound Laterality: Left, Medial Cleanser Soap and Water Discharge Instruction: May shower and wash wound with dial antibacterial soap and water prior to dressing change. Wound Cleanser Discharge Instruction: Cleanse the wound with wound cleanser prior to applying a clean dressing using gauze sponges, not tissue or cotton balls. Peri-Wound Care Topical Primary Dressing Maxorb Extra Calcium Alginate Dressing, 4x4 in Discharge Instruction: Apply calcium alginate to wound bed as instructed Secondary Dressing ABD Pad, 5x9 Discharge Instruction: Apply over primary dressing as directed. Woven Gauze Sponge, Non-Sterile 4x4 in Discharge Instruction: Apply over primary dressing as directed. Secured With Elastic Bandage 4 inch (ACE bandage) Discharge Instruction: Secure with ACE bandage as directed. Kerlix Roll Sterile, 4.5x3.1 (in/yd) Discharge Instruction: Secure with Kerlix as directed. Paper Tape, 2x10 (in/yd) Discharge Instruction: Secure dressing with tape as directed. Compression Wrap Compression Stockings Add-Ons Electronic Signature(s) Signed: 02/08/2022 6:12:29 PM By: John Hurst RN, BSN Entered By: John [name] on 02/08/2022 09:06:48 -------------------------------------------------------------------------------- Wound Assessment Details Patient Name: Date of Service: John Spell L. 02/08/2022 9:00 A M Medical Record Number: [id number] Patient Account Number: [id number] Date of Birth/Sex: Treating RN: 1951-07-17 (71 y.o. John [name] Primary Care Calistro Rauf: John [name] Other Clinician: Referring Jr Milliron: Treating Mariah Gerstenberger/Extender: John [name] in Treatment: 1 Wound Status Wound Number: 3 Primary Venous Leg Ulcer Etiology: Wound Location: Left,  Proximal, Posterior Lower Leg Wound Open Wounding Event: Blister Status: Date Acquired: 01/24/2022 Comorbid Glaucoma, Anemia, Arrhythmia, Congestive Heart Failure, Coronary Weeks Of Treatment: 1 History: Artery Disease, Hypertension, Peripheral Venous Disease, Type II Clustered Wound: No Diabetes, Osteoarthritis Photos Wound Measurements Length: (cm) 1.1 Width: (cm) 1 Depth: (cm) 0.1 Area: (cm) 0.864 Volume: (cm) 0.086 % Reduction in Area: 65.6% % Reduction in Volume: 65.7% Epithelialization: Small (1-33%) Tunneling: No Undermining: No Wound Description Classification: Full Thickness Without Exposed Support Structures Wound Margin: Flat and Intact Exudate Amount: Medium Exudate Type: Serosanguineous Exudate Color: red, brown Foul Odor After Cleansing: No Slough/Fibrino Yes Wound Bed Granulation Amount: Large (67-100%) Exposed Structure Granulation Quality: Red, Pink Fascia Exposed: No Necrotic Amount: Small (1-33%) Fat Layer (Subcutaneous Tissue) Exposed: Yes Necrotic Quality: Adherent Slough Tendon Exposed: No Muscle Exposed: No Joint Exposed: No Bone Exposed: No Treatment Notes Wound #3 (Lower Leg) Wound Laterality: Left, Posterior, Proximal Cleanser Soap and Water Discharge Instruction: May shower and wash wound with dial antibacterial soap  and water prior to dressing change. Wound Cleanser Discharge Instruction: Cleanse the wound with wound cleanser prior to applying a clean dressing using gauze sponges, not tissue or cotton balls. Peri-Wound Care Topical Primary Dressing Maxorb Extra Calcium Alginate Dressing, 4x4 in Discharge Instruction: Apply calcium alginate to wound bed as instructed Secondary Dressing ABD Pad, 5x9 Discharge Instruction: Apply over primary dressing as directed. Woven Gauze Sponge, Non-Sterile 4x4 in Discharge Instruction: Apply over primary dressing as directed. Secured With Elastic Bandage 4 inch (ACE bandage) Discharge  Instruction: Secure with ACE bandage as directed. Kerlix Roll Sterile, 4.5x3.1 (in/yd) Discharge Instruction: Secure with Kerlix as directed. Paper Tape, 2x10 (in/yd) Discharge Instruction: Secure dressing with tape as directed. Compression Wrap Compression Stockings Add-Ons Electronic Signature(s) Signed: 02/08/2022 6:12:29 PM By: John Hurst RN, BSN Entered By: John [name] on 02/08/2022 09:07:14 -------------------------------------------------------------------------------- Wound Assessment Details Patient Name: Date of Service: John Spell L. 02/08/2022 9:00 A M Medical Record Number: [id number] Patient Account Number: [id number] Date of Birth/Sex: Treating RN: April 29, 1951 (71 y.o. John [name] Primary Care Arista Kettlewell: John [name] Other Clinician: Referring Edwar Coe: Treating Anzal Bartnick/Extender: John [name] in Treatment: 1 Wound Status Wound Number: 4 Primary Venous Leg Ulcer Etiology: Wound Location: Left, Distal, Posterior Lower Leg Wound Converted Wounding Event: Blister Status: Date Acquired: 01/24/2022 Comorbid Glaucoma, Anemia, Arrhythmia, Congestive Heart Failure, Coronary Weeks Of Treatment: 1 History: Artery Disease, Hypertension, Peripheral Venous Disease, Type II Clustered Wound: No Diabetes, Osteoarthritis Wound Measurements Length: (cm) Width: (cm) Depth: (cm) Area: (cm) Volume: (cm) 0 % Reduction in Area: 100% 0 % Reduction in Volume: 100% 0 Epithelialization: Large (67-100%) 0 Tunneling: No 0 Undermining: No Wound Description Classification: Full Thickness Without Exposed Support Structures Wound Margin: Flat and Intact Exudate Amount: None Present Wound Bed Granulation Amount: None Present (0%) Necrotic Amount: None Present (0%) Foul Odor After Cleansing: No Slough/Fibrino No Exposed Structure Fascia Exposed: No Fat Layer (Subcutaneous Tissue) Exposed: No Tendon Exposed: No Muscle Exposed: No Joint  Exposed: No Bone Exposed: No Electronic Signature(s) Signed: 02/08/2022 6:12:29 PM By: John Hurst RN, BSN Entered By: John [name] on 02/08/2022 09:03:58 -------------------------------------------------------------------------------- Wound Assessment Details Patient Name: Date of Service: John Spell L. 02/08/2022 9:00 A M Medical Record Number: [id number] Patient Account Number: [id number] Date of Birth/Sex: Treating RN: [birth year] (71 y.o. John [name] Primary Care Necie Wilcoxson: John [name] Other Clinician: Referring Dandrea Medders: Treating Evadean Sproule/Extender: John [name] in Treatment: 1 Wound Status Wound Number: 5 Primary Venous Leg Ulcer Etiology: Wound Location: Right, Circumferential Lower Leg Wound Open Wounding Event: Blister Status: Date Acquired: 01/24/2022 Comorbid Glaucoma, Anemia, Arrhythmia, Congestive Heart Failure, Weeks Of Treatment: 1 History: Coronary Artery Disease, Hypertension, Peripheral Venous Clustered Wound: Yes Disease, Type II Diabetes, Osteoarthritis Photos Wound Measurements Length: (cm) 12.2 Width: (cm) 17 Depth: (cm) 0.1 Clustered Quantity: 9 Area: (cm) 162.892 Volume: (cm) 16.289 % Reduction in Area: 52.3% % Reduction in Volume: 52.3% Epithelialization: Small (1-33%) Tunneling: No Undermining: No Wound Description Classification: Full Thickness Without Exposed Support Structures Wound Margin: Flat and Intact Exudate Amount: Large Exudate Type: Serous Exudate Color: amber Foul Odor After Cleansing: No Slough/Fibrino Yes Wound Bed Granulation Amount: Large (67-100%) Exposed Structure Granulation Quality: Red, Pink Fascia Exposed: No Necrotic Amount: Small (1-33%) Fat Layer (Subcutaneous Tissue) Exposed: Yes Necrotic Quality: Adherent Slough Tendon Exposed: No Muscle Exposed: No Joint Exposed: No Bone Exposed: No Treatment Notes Wound #5 (Lower Leg) Wound Laterality: Right,  Circumferential Cleanser Soap and Water Discharge Instruction: May shower and  wash wound with dial antibacterial soap and water prior to dressing change. Wound Cleanser Discharge Instruction: Cleanse the wound with wound cleanser prior to applying a clean dressing using gauze sponges, not tissue or cotton balls. Peri-Wound Care Topical Primary Dressing Maxorb Extra Calcium Alginate Dressing, 4x4 in Discharge Instruction: Apply calcium alginate to wound bed as instructed Secondary Dressing ABD Pad, 8x10 Discharge Instruction: Apply over primary dressing as directed. Woven Gauze Sponge, Non-Sterile 4x4 in Discharge Instruction: Apply over primary dressing as directed. Secured With Elastic Bandage 4 inch (ACE bandage) Discharge Instruction: Secure with ACE bandage as directed. Kerlix Roll Sterile, 4.5x3.1 (in/yd) Discharge Instruction: Secure with Kerlix as directed. Paper Tape, 2x10 (in/yd) Discharge Instruction: Secure dressing with tape as directed. Compression Wrap Compression Stockings Add-Ons Electronic Signature(s) Signed: 02/08/2022 6:12:29 PM By: John Hurst RN, BSN Entered By: John [name] on 02/08/2022 09:05:15 -------------------------------------------------------------------------------- Vitals Details Patient Name: Date of Service: John Spell L. 02/08/2022 9:00 A M Medical Record Number: [id number] Patient Account Number: [id number] Date of Birth/Sex: Treating RN: [birth year] (71 y.o. John [name] Primary Care Ein Rijo: John [name] Other Clinician: Referring Ottie Neglia: Treating Inza Mikrut/Extender: John [name] in Treatment: 1 Vital Signs Time Taken: 08:49 Temperature (F): 97.2 Height (in): 71 Pulse (bpm): 79 Weight (lbs): 180 Respiratory Rate (breaths/min): 18 Body Mass Index (BMI): 25.1 Blood Pressure (mmHg): 89/62 Reference Range: 80 - 120 mg / dl Notes Dr. Celine Ahr notified of BP Electronic  Signature(s) Signed: 02/08/2022 6:12:29 PM By: John Hurst RN, BSN Entered By: John [name] on 02/08/2022 09:13:20

## 2022-02-08 NOTE — Progress Notes (Signed)
John [name] ([id number]) , Visit Report for 02/08/2022 Chief Complaint Document Details Patient Name: Date of Service: John [name] 02/08/2022 9:00 A M Medical Record Number: [id number] Patient Account Number: [id number] Date of Birth/Sex: Treating RN: 24-Nov-1950 (71 y.o. [name] Primary Care Provider: Claretta Fraise Other Clinician: Referring Provider: Treating Provider/Extender: Lucienne Capers in Treatment: 1 Information Obtained from: Patient Chief Complaint Patient presents for treatment of multiple open ulcers due to venous insufficiency and lymphedema Electronic Signature(s) Signed: 02/08/2022 9:34:36 AM By: Fredirick Maudlin MD FACS Entered By: Fredirick Maudlin on 02/08/2022 09:34:36 -------------------------------------------------------------------------------- Debridement Details Patient Name: Date of Service: John Spell L. 02/08/2022 9:00 A M Medical Record Number: [id number] Patient Account Number: [id number] Date of Birth/Sex: Treating RN: 03-08-1951 (71 y.o. [name] Primary Care Provider: Claretta Fraise Other Clinician: Referring Provider: Treating Provider/Extender: Lucienne Capers in Treatment: 1 Debridement Performed for Assessment: Wound #3 Left,Proximal,Posterior Lower Leg Performed By: Physician Fredirick Maudlin, MD Debridement Type: Debridement Severity of Tissue Pre Debridement: Fat layer exposed Level of Consciousness (Pre-procedure): Awake and Alert Pre-procedure Verification/Time Out Yes - 09:14 Taken: Start Time: 09:15 T Area Debrided (L x W): otal 1.1 (cm) x 1 (cm) = 1.1 (cm) Tissue and other material debrided: Non-Viable, Slough, Slough Level: Non-Viable Tissue Debridement Description: Selective/Open Wound Instrument: Curette Bleeding: Minimum Hemostasis Achieved: Pressure End Time: 09:16 Procedural Pain: 0 Post Procedural Pain: 0 Response to Treatment: Procedure was  tolerated well Level of Consciousness (Post- Awake and Alert procedure): Post Debridement Measurements of Total Wound Length: (cm) 1.1 Width: (cm) 1 Depth: (cm) 0.1 Volume: (cm) 0.086 Character of Wound/Ulcer Post Debridement: Improved Severity of Tissue Post Debridement: Fat layer exposed Post Procedure Diagnosis Same as Pre-procedure Electronic Signature(s) Signed: 02/08/2022 12:38:17 PM By: Fredirick Maudlin MD FACS Signed: 02/08/2022 6:12:29 PM By: Levan Hurst RN, BSN Entered By: Levan Hurst on 02/08/2022 09:17:02 -------------------------------------------------------------------------------- Debridement Details Patient Name: Date of Service: John Spell L. 02/08/2022 9:00 A M Medical Record Number: [id number] Patient Account Number: [id number] Date of Birth/Sex: Treating RN: 1950-12-07 (71 y.o. [name] Primary Care Provider: Claretta Fraise Other Clinician: Referring Provider: Treating Provider/Extender: Lucienne Capers in Treatment: 1 Debridement Performed for Assessment: Wound #5 Right,Circumferential Lower Leg Performed By: Physician Fredirick Maudlin, MD Debridement Type: Debridement Severity of Tissue Pre Debridement: Fat layer exposed Level of Consciousness (Pre-procedure): Awake and Alert Pre-procedure Verification/Time Out Yes - 09:14 Taken: Start Time: 09:14 T Area Debrided (L x W): otal 5 (cm) x 3 (cm) = 15 (cm) Tissue and other material debrided: Non-Viable, Eschar, Skin: Epidermis, Biofilm Level: Skin/Epidermis Debridement Description: Selective/Open Wound Instrument: Curette Bleeding: Minimum Hemostasis Achieved: Pressure End Time: 09:15 Procedural Pain: 0 Post Procedural Pain: 0 Response to Treatment: Procedure was tolerated well Level of Consciousness (Post- Awake and Alert procedure): Post Debridement Measurements of Total Wound Length: (cm) 12.2 Width: (cm) 17 Depth: (cm) 0.1 Volume: (cm)  16.289 Character of Wound/Ulcer Post Debridement: Improved Severity of Tissue Post Debridement: Fat layer exposed Post Procedure Diagnosis Same as Pre-procedure Electronic Signature(s) Signed: 02/08/2022 12:38:17 PM By: Fredirick Maudlin MD FACS Signed: 02/08/2022 6:12:29 PM By: Levan Hurst RN, BSN Entered By: Levan Hurst on 02/08/2022 09:17:46 -------------------------------------------------------------------------------- HPI Details Patient Name: Date of Service: John Spell L. 02/08/2022 9:00 A M Medical Record Number: [id number] Patient Account Number: [id number] Date of Birth/Sex: Treating RN: [birth year] (71 y.o. [name] Primary Care Provider: Claretta Fraise Other Clinician: Referring  Provider: Treating Provider/Extender: Lucienne Capers in Treatment: 1 History of Present Illness HPI Description: ADMISSION 01/31/2022 This is a 71 year old man with severe systolic heart failure (ejection fraction roughly 20%) stage III chronic kidney disease, chronic diastolic dysfunction type 2 diabetes mellitus (last hemoglobin A1c 7.0% on November 17, 2021), lymphedema, hypertension, and chronic atrial fibrillation. He has had massive fluid shifts over the past 1 to 2 weeks. He is on torsemide and does have a protocol for increasing his dose if his weight fluctuates a certain number of pounds. Despite this, his legs have swollen tremendously and he has had multiple blisters open and breakdown bilaterally. He is here today for further evaluation of these wounds. On his right leg, he has nearly circumferential wounds. Some of these are limited to skin breakdown, while others have the fat layer exposed. There is slough accumulation in the deeper wounds. He has similar wounds, albeit less extensive, on the left. He says that his legs feel like they are burning. 02/08/2022: Over the past week, the patient has lost 13 pounds of fluid. His wife has been changing his  dressings. She reports that they never received any of the calcium alginate that was ordered. She has just been using ABD pads and Kerlix. In spite of this, however, all of his wounds are a little bit smaller and they have clean surfaces. There is just a little bit of eschar and slough accumulation on a couple of the open areas. He continues to complain of intense burning and tingling in his legs and feet. Electronic Signature(s) Signed: 02/08/2022 9:35:31 AM By: Fredirick Maudlin MD FACS Entered By: Fredirick Maudlin on 02/08/2022 09:35:31 -------------------------------------------------------------------------------- Physical Exam Details Patient Name: Date of Service: John Spell L. 02/08/2022 9:00 A M Medical Record Number: [id number] Patient Account Number: [id number] Date of Birth/Sex: Treating RN: [birth year] (71 y.o. [name] Primary Care Provider: Claretta Fraise Other Clinician: Referring Provider: Treating Provider/Extender: Lucienne Capers in Treatment: 1 Constitutional Slightly hypotensive, asymptomatic.. . . . No acute distress. Respiratory Normal work of breathing on room air. Notes 02/08/2022: All of the wounds are perhaps a little bit smaller to the same size. There is some accumulated eschar and slough on several of the surfaces, but for the most part they are clean. Edema control is markedly improved. Electronic Signature(s) Signed: 02/08/2022 9:38:03 AM By: Fredirick Maudlin MD FACS Entered By: Fredirick Maudlin on 02/08/2022 09:38:03 -------------------------------------------------------------------------------- Physician Orders Details Patient Name: Date of Service: John Spell L. 02/08/2022 9:00 A M Medical Record Number: [id number] Patient Account Number: [id number] Date of Birth/Sex: Treating RN: 1951-06-01 (71 y.o. [name] Primary Care Provider: Claretta Fraise Other Clinician: Referring Provider: Treating  Provider/Extender: Lucienne Capers in Treatment: 1 Verbal / Phone Orders: No Diagnosis Coding ICD-10 Coding Code Description A45.36 Chronic diastolic (congestive) heart failure Hypertensive heart and chronic kidney disease with heart failure and stage 1 through stage 4 chronic kidney disease, or unspecified I13.0 chronic kidney disease N18.30 Chronic kidney disease, stage 3 unspecified E11.9 Type 2 diabetes mellitus without complications I68.0 Lymphedema, not elsewhere classified L97.822 Non-pressure chronic ulcer of other part of left lower leg with fat layer exposed L97.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 L97.811 Non-pressure chronic ulcer of other part of right lower leg limited to breakdown of skin Follow-up Appointments  ppointment in 1 week. - Dr. Celine Ahr - Room 4 - Thursday 6/8 at 9:00 Return A Bathing/ Shower/ Hygiene May shower with protection but do not get wound dressing(s) wet. - Ok to use [employer], can buy at [facility], [facility], or [employer] Edema Control - Lymphedema / SCD / Other Elevate legs to the level of the heart or above for 30 minutes daily and/or when sitting, a frequency of: - throughout the day Avoid standing for long periods of time. Exercise regularly Wound Treatment Wound #1 - Lower Leg Wound Laterality: Left, Anterior Cleanser: Soap and Water Every Other Day/30 Days Discharge Instructions: May shower and wash wound with dial antibacterial soap and water prior to dressing change. Cleanser: Wound Cleanser Every Other Day/30 Days Discharge Instructions: Cleanse the wound with wound cleanser prior to applying a clean dressing using gauze sponges, not tissue or cotton balls. Prim Dressing: Maxorb Extra Calcium Alginate Dressing, 4x4 in Every Other Day/30 Days ary Discharge Instructions: Apply  calcium alginate to wound bed as instructed Secondary Dressing: ABD Pad, 8x10 Every Other Day/30 Days Discharge Instructions: Apply over primary dressing as directed. Secondary Dressing: Woven Gauze Sponge, Non-Sterile 4x4 in Every Other Day/30 Days Discharge Instructions: Apply over primary dressing as directed. Secured With: Elastic Bandage 4 inch (ACE bandage) Every Other Day/30 Days Discharge Instructions: Secure with ACE bandage as directed. Secured With: [employer], 4.5x3.1 (in/yd) Every Other Day/30 Days Discharge Instructions: Secure with Kerlix as directed. Secured With: Paper Tape, 2x10 (in/yd) Every Other Day/30 Days Discharge Instructions: Secure dressing with tape as directed. Wound #2 - Lower Leg Wound Laterality: Left, Medial Cleanser: Soap and Water Every Other Day/30 Days Discharge Instructions: May shower and wash wound with dial antibacterial soap and water prior to dressing change. Cleanser: Wound Cleanser Every Other Day/30 Days Discharge Instructions: Cleanse the wound with wound cleanser prior to applying a clean dressing using gauze sponges, not tissue or cotton balls. Prim Dressing: Maxorb Extra Calcium Alginate Dressing, 4x4 in Every Other Day/30 Days ary Discharge Instructions: Apply calcium alginate to wound bed as instructed Secondary Dressing: ABD Pad, 5x9 Every Other Day/30 Days Discharge Instructions: Apply over primary dressing as directed. Secondary Dressing: Woven Gauze Sponge, Non-Sterile 4x4 in Every Other Day/30 Days Discharge Instructions: Apply over primary dressing as directed. Secured With: Elastic Bandage 4 inch (ACE bandage) Every Other Day/30 Days Discharge Instructions: Secure with ACE bandage as directed. Secured With: [employer], 4.5x3.1 (in/yd) Every Other Day/30 Days Discharge Instructions: Secure with Kerlix as directed. Secured With: Paper Tape, 2x10 (in/yd) Every Other Day/30 Days Discharge Instructions: Secure  dressing with tape as directed. Wound #3 - Lower Leg Wound Laterality: Left, Posterior, Proximal Cleanser: Soap and Water Every Other Day/30 Days Discharge Instructions: May shower and wash wound with dial antibacterial soap and water prior to dressing change. Cleanser: Wound Cleanser Every Other Day/30 Days Discharge Instructions: Cleanse the wound with wound cleanser prior to applying a clean dressing using gauze sponges, not tissue or cotton balls. Prim Dressing: Maxorb Extra Calcium Alginate Dressing, 4x4 in Every Other Day/30 Days ary Discharge Instructions: Apply calcium alginate to wound bed as instructed Secondary Dressing: ABD Pad, 5x9 Every Other Day/30 Days Discharge Instructions: Apply over primary dressing as directed. Secondary Dressing: Woven Gauze Sponge, Non-Sterile 4x4 in Every Other Day/30 Days Discharge Instructions: Apply over primary dressing as directed. Secured With: Elastic Bandage 4 inch (ACE bandage) Every Other Day/30 Days Discharge Instructions: Secure with ACE bandage as directed. Secured With: [employer], 4.5x3.1 (  in/yd) Every Other Day/30 Days Discharge Instructions: Secure with Kerlix as directed. Secured With: Paper Tape, 2x10 (in/yd) Every Other Day/30 Days Discharge Instructions: Secure dressing with tape as directed. Wound #5 - Lower Leg Wound Laterality: Right, Circumferential Cleanser: Soap and Water Every Other Day/30 Days Discharge Instructions: May shower and wash wound with dial antibacterial soap and water prior to dressing change. Cleanser: Wound Cleanser Every Other Day/30 Days Discharge Instructions: Cleanse the wound with wound cleanser prior to applying a clean dressing using gauze sponges, not tissue or cotton balls. Prim Dressing: Maxorb Extra Calcium Alginate Dressing, 4x4 in Every Other Day/30 Days ary Discharge Instructions: Apply calcium alginate to wound bed as instructed Secondary Dressing: ABD Pad, 8x10 Every Other Day/30  Days Discharge Instructions: Apply over primary dressing as directed. Secondary Dressing: Woven Gauze Sponge, Non-Sterile 4x4 in Every Other Day/30 Days Discharge Instructions: Apply over primary dressing as directed. Secured With: Elastic Bandage 4 inch (ACE bandage) Every Other Day/30 Days Discharge Instructions: Secure with ACE bandage as directed. Secured With: [employer], 4.5x3.1 (in/yd) Every Other Day/30 Days Discharge Instructions: Secure with Kerlix as directed. Secured With: Paper Tape, 2x10 (in/yd) Every Other Day/30 Days Discharge Instructions: Secure dressing with tape as directed. Electronic Signature(s) Signed: 02/08/2022 12:38:17 PM By: Fredirick Maudlin MD FACS Entered By: Fredirick Maudlin on 02/08/2022 09:38:19 -------------------------------------------------------------------------------- Problem List Details Patient Name: Date of Service: John Spell L. 02/08/2022 9:00 A M Medical Record Number: [id number] Patient Account Number: [id number] Date of Birth/Sex: Treating RN: November 14, 1950 (71 y.o. [name] Primary Care Provider: Claretta Fraise Other Clinician: Referring Provider: Treating Provider/Extender: Lucienne Capers in Treatment: 1 Active Problems ICD-10 Encounter Code Description Active Date MDM Diagnosis K24.09 Chronic diastolic (congestive) heart failure 01/31/2022 No Yes I13.0 Hypertensive heart and chronic kidney disease with heart failure and stage 1 01/31/2022 No Yes through stage 4 chronic kidney disease, or unspecified chronic kidney disease N18.30 Chronic kidney disease, stage 3 unspecified 01/31/2022 No Yes E11.9 Type 2 diabetes mellitus without complications 7/35/3299 No Yes I89.0 Lymphedema, not elsewhere classified 01/31/2022 No Yes L97.822 Non-pressure chronic ulcer of other part of left lower leg with fat layer exposed5/24/2023 No Yes L97.821 Non-pressure chronic ulcer of other part of left lower leg  limited to breakdown 01/31/2022 No Yes of skin L97.812 Non-pressure chronic ulcer of other part of right lower leg with fat layer 01/31/2022 No Yes exposed L97.811 Non-pressure chronic ulcer of other part of right lower leg limited to breakdown 01/31/2022 No Yes of skin Inactive Problems Resolved Problems Electronic Signature(s) Signed: 02/08/2022 9:34:01 AM By: Fredirick Maudlin MD FACS Entered By: Fredirick Maudlin on 02/08/2022 09:34:01 -------------------------------------------------------------------------------- Progress Note Details Patient Name: Date of Service: John Spell L. 02/08/2022 9:00 A M Medical Record Number: [id number] Patient Account Number: [id number] Date of Birth/Sex: Treating RN: 02/08/1951 (71 y.o. [name] Primary Care Provider: Claretta Fraise Other Clinician: Referring Provider: Treating Provider/Extender: Lucienne Capers in Treatment: 1 Subjective Chief Complaint Information obtained from Patient Patient presents for treatment of multiple open ulcers due to venous insufficiency and lymphedema History of Present Illness (HPI) ADMISSION 01/31/2022 This is a 71 year old man with severe systolic heart failure (ejection fraction roughly 20%) stage III chronic kidney disease, chronic diastolic dysfunction type 2 diabetes mellitus (last hemoglobin A1c 7.0% on November 17, 2021), lymphedema, hypertension, and chronic atrial fibrillation. He has had massive fluid shifts over the past 1 to 2 weeks. He is on torsemide and does have a  protocol for increasing his dose if his weight fluctuates a certain number of pounds. Despite this, his legs have swollen tremendously and he has had multiple blisters open and breakdown bilaterally. He is here today for further evaluation of these wounds. On his right leg, he has nearly circumferential wounds. Some of these are limited to skin breakdown, while others have the fat layer exposed. There is  slough accumulation in the deeper wounds. He has similar wounds, albeit less extensive, on the left. He says that his legs feel like they are burning. 02/08/2022: Over the past week, the patient has lost 13 pounds of fluid. His wife has been changing his dressings. She reports that they never received any of the calcium alginate that was ordered. She has just been using ABD pads and Kerlix. In spite of this, however, all of his wounds are a little bit smaller and they have clean surfaces. There is just a little bit of eschar and slough accumulation on a couple of the open areas. He continues to complain of intense burning and tingling in his legs and feet. Patient History Information obtained from Patient. Family History Unknown History. Social History Former smoker, Marital Status - Married, Alcohol Use - Never, Drug Use - No History, Caffeine Use - Moderate. Medical History Eyes Patient has history of Glaucoma Hematologic/Lymphatic Patient has history of Anemia Cardiovascular Patient has history of Arrhythmia - A-Fib, Congestive Heart Failure, Coronary Artery Disease, Hypertension, Peripheral Venous Disease Endocrine Patient has history of Type II Diabetes Musculoskeletal Patient has history of Osteoarthritis Medical A Surgical History Notes nd Cardiovascular Rheumatic heart disease, Mitral Valve Replacement Objective Constitutional Slightly hypotensive, asymptomatic.Marland Kitchen No acute distress. Vitals Time Taken: 8:49 AM, Height: 71 in, Weight: 180 lbs, BMI: 25.1, Temperature: 97.2 F, Pulse: 79 bpm, Respiratory Rate: 18 breaths/min, Blood Pressure: 89/62 mmHg. General Notes: Dr. Celine Ahr notified of BP Respiratory Normal work of breathing on room air. General Notes: 02/08/2022: All of the wounds are perhaps a little bit smaller to the same size. There is some accumulated eschar and slough on several of the surfaces, but for the most part they are clean. Edema control is markedly  improved. Integumentary (Hair, Skin) Wound #1 status is Open. Original cause of wound was Blister. The date acquired was: 01/24/2022. The wound has been in treatment 1 weeks. The wound is located on the Left,Anterior Lower Leg. The wound measures 6cm length x 9cm width x 0.1cm depth; 42.412cm^2 area and 4.241cm^3 volume. There is Fat Layer (Subcutaneous Tissue) exposed. There is no tunneling or undermining noted. There is a medium amount of serosanguineous drainage noted. The wound margin is flat and intact. There is large (67-100%) red, pink granulation within the wound bed. There is no necrotic tissue within the wound bed. Wound #2 status is Open. Original cause of wound was Blister. The date acquired was: 01/24/2022. The wound has been in treatment 1 weeks. The wound is located on the Left,Medial Lower Leg. The wound measures 2.9cm length x 4.3cm width x 0.1cm depth; 9.794cm^2 area and 0.979cm^3 volume. There is Fat Layer (Subcutaneous Tissue) exposed. There is no tunneling or undermining noted. There is a large amount of serous drainage noted. The wound margin is flat and intact. There is large (67-100%) pink granulation within the wound bed. There is a small (1-33%) amount of necrotic tissue within the wound bed including Adherent Slough. Wound #3 status is Open. Original cause of wound was Blister. The date acquired was: 01/24/2022. The wound has been in treatment  1 weeks. The wound is located on the Left,Proximal,Posterior Lower Leg. The wound measures 1.1cm length x 1cm width x 0.1cm depth; 0.864cm^2 area and 0.086cm^3 volume. There is Fat Layer (Subcutaneous Tissue) exposed. There is no tunneling or undermining noted. There is a medium amount of serosanguineous drainage noted. The wound margin is flat and intact. There is large (67-100%) red, pink granulation within the wound bed. There is a small (1-33%) amount of necrotic tissue within the wound bed including Adherent Slough. Wound #4 status  is Converted. Original cause of wound was Blister. The date acquired was: 01/24/2022. The wound has been in treatment 1 weeks. The wound is located on the Left,Distal,Posterior Lower Leg. The wound measures 0cm length x 0cm width x 0cm depth; 0cm^2 area and 0cm^3 volume. There is no tunneling or undermining noted. There is a none present amount of drainage noted. The wound margin is flat and intact. There is no granulation within the wound bed. There is no necrotic tissue within the wound bed. Wound #5 status is Open. Original cause of wound was Blister. The date acquired was: 01/24/2022. The wound has been in treatment 1 weeks. The wound is located on the Right,Circumferential Lower Leg. The wound measures 12.2cm length x 17cm width x 0.1cm depth; 162.892cm^2 area and 16.289cm^3 volume. There is Fat Layer (Subcutaneous Tissue) exposed. There is no tunneling or undermining noted. There is a large amount of serous drainage noted. The wound margin is flat and intact. There is large (67-100%) red, pink granulation within the wound bed. There is a small (1-33%) amount of necrotic tissue within the wound bed including Adherent Slough. Assessment Active Problems [id number] Chronic diastolic (congestive) heart failure Hypertensive heart and chronic kidney disease with heart failure and stage 1 through stage 4 chronic kidney disease, or unspecified chronic kidney disease Chronic kidney disease, stage 3 unspecified Type 2 diabetes mellitus without complications Lymphedema, not elsewhere classified Non-pressure chronic ulcer of other part of left lower leg with fat layer exposed Non-pressure chronic ulcer of other part of left lower leg limited to breakdown of skin Non-pressure chronic ulcer of other part of right lower leg with fat layer exposed Non-pressure chronic ulcer of other part of right lower leg limited to breakdown of skin Procedures Wound #3 Pre-procedure diagnosis of Wound #3 is a Venous Leg  Ulcer located on the Left,Proximal,Posterior Lower Leg .Severity of Tissue Pre Debridement is: Fat layer exposed. There was a Selective/Open Wound Non-Viable Tissue Debridement with a total area of 1.1 sq cm performed by Fredirick Maudlin, MD. With the following instrument(s): Curette to remove Non-Viable tissue/material. Material removed includes [facility]. No specimens were taken. A time out was conducted at 09:14, prior to the start of the procedure. A Minimum amount of bleeding was controlled with Pressure. The procedure was tolerated well with a pain level of 0 throughout and a pain level of 0 following the procedure. Post Debridement Measurements: 1.1cm length x 1cm width x 0.1cm depth; 0.086cm^3 volume. Character of Wound/Ulcer Post Debridement is improved. Severity of Tissue Post Debridement is: Fat layer exposed. Post procedure Diagnosis Wound #3: Same as Pre-Procedure Wound #5 Pre-procedure diagnosis of Wound #5 is a Venous Leg Ulcer located on the Right,Circumferential Lower Leg .Severity of Tissue Pre Debridement is: Fat layer exposed. There was a Selective/Open Wound Skin/Epidermis Debridement with a total area of 15 sq cm performed by Fredirick Maudlin, MD. With the following instrument(s): Curette to remove Non-Viable tissue/material. Material removed includes Eschar, Skin: Epidermis, and Biofilm. No specimens  were taken. A time out was conducted at 09:14, prior to the start of the procedure. A Minimum amount of bleeding was controlled with Pressure. The procedure was tolerated well with a pain level of 0 throughout and a pain level of 0 following the procedure. Post Debridement Measurements: 12.2cm length x 17cm width x 0.1cm depth; 16.289cm^3 volume. Character of Wound/Ulcer Post Debridement is improved. Severity of Tissue Post Debridement is: Fat layer exposed. Post procedure Diagnosis Wound #5: Same as Pre-Procedure Plan Follow-up Appointments: Return Appointment in 1 week. - Dr.  Celine Ahr - Room 4 - Thursday 6/8 at 9:00 Bathing/ Shower/ Hygiene: May shower with protection but do not get wound dressing(s) wet. - Ok to use Biomedical engineer, can buy at [facility], [facility], or [employer] Edema Control - Lymphedema / SCD / Other: Elevate legs to the level of the heart or above for 30 minutes daily and/or when sitting, a frequency of: - throughout the day Avoid standing for long periods of time. Exercise regularly WOUND #1: - Lower Leg Wound Laterality: Left, Anterior Cleanser: Soap and Water Every Other Day/30 Days Discharge Instructions: May shower and wash wound with dial antibacterial soap and water prior to dressing change. Cleanser: Wound Cleanser Every Other Day/30 Days Discharge Instructions: Cleanse the wound with wound cleanser prior to applying a clean dressing using gauze sponges, not tissue or cotton balls. Prim Dressing: Maxorb Extra Calcium Alginate Dressing, 4x4 in Every Other Day/30 Days ary Discharge Instructions: Apply calcium alginate to wound bed as instructed Secondary Dressing: ABD Pad, 8x10 Every Other Day/30 Days Discharge Instructions: Apply over primary dressing as directed. Secondary Dressing: Woven Gauze Sponge, Non-Sterile 4x4 in Every Other Day/30 Days Discharge Instructions: Apply over primary dressing as directed. Secured With: Elastic Bandage 4 inch (ACE bandage) Every Other Day/30 Days Discharge Instructions: Secure with ACE bandage as directed. Secured With: [employer], 4.5x3.1 (in/yd) Every Other Day/30 Days Discharge Instructions: Secure with Kerlix as directed. Secured With: Paper T ape, 2x10 (in/yd) Every Other Day/30 Days Discharge Instructions: Secure dressing with tape as directed. WOUND #2: - Lower Leg Wound Laterality: Left, Medial Cleanser: Soap and Water Every Other Day/30 Days Discharge Instructions: May shower and wash wound with dial antibacterial soap and water prior to dressing change. Cleanser: Wound Cleanser Every Other  Day/30 Days Discharge Instructions: Cleanse the wound with wound cleanser prior to applying a clean dressing using gauze sponges, not tissue or cotton balls. Prim Dressing: Maxorb Extra Calcium Alginate Dressing, 4x4 in Every Other Day/30 Days ary Discharge Instructions: Apply calcium alginate to wound bed as instructed Secondary Dressing: ABD Pad, 5x9 Every Other Day/30 Days Discharge Instructions: Apply over primary dressing as directed. Secondary Dressing: Woven Gauze Sponge, Non-Sterile 4x4 in Every Other Day/30 Days Discharge Instructions: Apply over primary dressing as directed. Secured With: Elastic Bandage 4 inch (ACE bandage) Every Other Day/30 Days Discharge Instructions: Secure with ACE bandage as directed. Secured With: [employer], 4.5x3.1 (in/yd) Every Other Day/30 Days Discharge Instructions: Secure with Kerlix as directed. Secured With: Paper T ape, 2x10 (in/yd) Every Other Day/30 Days Discharge Instructions: Secure dressing with tape as directed. WOUND #3: - Lower Leg Wound Laterality: Left, Posterior, Proximal Cleanser: Soap and Water Every Other Day/30 Days Discharge Instructions: May shower and wash wound with dial antibacterial soap and water prior to dressing change. Cleanser: Wound Cleanser Every Other Day/30 Days Discharge Instructions: Cleanse the wound with wound cleanser prior to applying a clean dressing using gauze sponges, not tissue or cotton balls. Prim Dressing:  Maxorb Extra Calcium Alginate Dressing, 4x4 in Every Other Day/30 Days ary Discharge Instructions: Apply calcium alginate to wound bed as instructed Secondary Dressing: ABD Pad, 5x9 Every Other Day/30 Days Discharge Instructions: Apply over primary dressing as directed. Secondary Dressing: Woven Gauze Sponge, Non-Sterile 4x4 in Every Other Day/30 Days Discharge Instructions: Apply over primary dressing as directed. Secured With: Elastic Bandage 4 inch (ACE bandage) Every Other Day/30  Days Discharge Instructions: Secure with ACE bandage as directed. Secured With: [employer], 4.5x3.1 (in/yd) Every Other Day/30 Days Discharge Instructions: Secure with Kerlix as directed. Secured With: Paper T ape, 2x10 (in/yd) Every Other Day/30 Days Discharge Instructions: Secure dressing with tape as directed. WOUND #5: - Lower Leg Wound Laterality: Right, Circumferential Cleanser: Soap and Water Every Other Day/30 Days Discharge Instructions: May shower and wash wound with dial antibacterial soap and water prior to dressing change. Cleanser: Wound Cleanser Every Other Day/30 Days Discharge Instructions: Cleanse the wound with wound cleanser prior to applying a clean dressing using gauze sponges, not tissue or cotton balls. Prim Dressing: Maxorb Extra Calcium Alginate Dressing, 4x4 in Every Other Day/30 Days ary Discharge Instructions: Apply calcium alginate to wound bed as instructed Secondary Dressing: ABD Pad, 8x10 Every Other Day/30 Days Discharge Instructions: Apply over primary dressing as directed. Secondary Dressing: Woven Gauze Sponge, Non-Sterile 4x4 in Every Other Day/30 Days Discharge Instructions: Apply over primary dressing as directed. Secured With: Elastic Bandage 4 inch (ACE bandage) Every Other Day/30 Days Discharge Instructions: Secure with ACE bandage as directed. Secured With: [employer], 4.5x3.1 (in/yd) Every Other Day/30 Days Discharge Instructions: Secure with Kerlix as directed. Secured With: Paper T ape, 2x10 (in/yd) Every Other Day/30 Days Discharge Instructions: Secure dressing with tape as directed. 02/08/2022: All of the wounds are perhaps a little bit smaller to the same size. There is some accumulated eschar and slough on several of the surfaces, but for the most part they are clean. Edema control is markedly improved. I used a curette to remove eschar and slough from the wounds that had this accumulation. We will continue using the calcium  alginate (we will double check to make sure some is sent to his house), with ABDs and gauze wrapping. I strongly encouraged both the patient and his wife to discuss his neuropathy with his primary care provider as I think he would benefit from medical management of this issue. He will follow-up in 1 week. Electronic Signature(s) Signed: 02/08/2022 9:39:27 AM By: Fredirick Maudlin MD FACS Entered By: Fredirick Maudlin on 02/08/2022 09:39:26 -------------------------------------------------------------------------------- HxROS Details Patient Name: Date of Service: John Spell L. 02/08/2022 9:00 A M Medical Record Number: [id number] Patient Account Number: [id number] Date of Birth/Sex: Treating RN: [birth year] (71 y.o. [name] Primary Care Provider: Claretta Fraise Other Clinician: Referring Provider: Treating Provider/Extender: Lucienne Capers in Treatment: 1 Information Obtained From Patient Eyes Medical History: Positive for: Glaucoma Hematologic/Lymphatic Medical History: Positive for: Anemia Cardiovascular Medical History: Positive for: Arrhythmia - A-Fib; Congestive Heart Failure; Coronary Artery Disease; Hypertension; Peripheral Venous Disease Past Medical History Notes: Rheumatic heart disease, Mitral Valve Replacement Endocrine Medical History: Positive for: Type II Diabetes Treated with: Insulin, Oral agents Blood sugar tested every day: Yes Tested : twice a day Musculoskeletal Medical History: Positive for: Osteoarthritis HBO Extended History Items Eyes: Glaucoma Immunizations Pneumococcal Vaccine: Received Pneumococcal Vaccination: Yes Received Pneumococcal Vaccination On or After 60th Birthday: No Implantable Devices None Family and Social History Unknown History: Yes; Former smoker; Marital Status -  Married; Alcohol Use: Never; Drug Use: No History; Caffeine Use: Moderate; Financial Concerns: No; Food, Clothing or Shelter  Needs: No; Support System Lacking: No; Transportation Concerns: No Engineer, maintenance) Signed: 02/08/2022 12:38:17 PM By: Fredirick Maudlin MD FACS Signed: 02/08/2022 6:12:29 PM By: Levan Hurst RN, BSN Entered By: Fredirick Maudlin on 02/08/2022 09:36:14 -------------------------------------------------------------------------------- [facility] Details Patient Name: Date of Service: John [name] 02/08/2022 Medical Record Number: [id number] Patient Account Number: [id number] Date of Birth/Sex: Treating RN: [birth year] (71 y.o. 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.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 L97.811 Non-pressure chronic ulcer of other part of right lower leg limited to breakdown of skin Facility Procedures CPT4 Code: [id number] Description: 33435 - DEBRIDE WOUND 1ST 20 SQ CM OR < ICD-10 Diagnosis Description L97.822 Non-pressure chronic ulcer of other part of left lower leg with fat layer exposed L97.821 Non-pressure chronic ulcer of other part of left lower leg limited to  breakdown o L97.812 Non-pressure chronic ulcer of other part of right lower leg with fat layer expose L97.811 Non-pressure chronic  ulcer of other part of right lower leg limited to breakdown Modifier: f skin d of skin Quantity: 1 Physician Procedures : CPT4 Code Description Modifier [id number] - WC PHYS LEVEL 3 - EST PT 25 ICD-10 Diagnosis Description L97.822 Non-pressure chronic ulcer of other part of left lower leg with fat layer exposed L97.821 Non-pressure chronic ulcer of other part of left  lower leg limited to breakdown of skin L97.812 Non-pressure chronic ulcer of other part of right lower leg with fat layer exposed L97.811 Non-pressure chronic ulcer of other part of right lower leg limited to breakdown of skin Quantity: 1 : [id number] - WC PHYS DEBR WO ANESTH 20 SQ CM ICD-10 Diagnosis Description L97.822 Non-pressure chronic ulcer of other part of left lower leg with fat layer exposed L97.821 Non-pressure chronic ulcer of other part of left lower leg limited to  breakdown of skin L97.812 Non-pressure chronic ulcer of other part of right lower leg with fat layer exposed L97.811 Non-pressure chronic ulcer of other part of right lower leg limited to breakdown of skin Quantity: 1 Electronic Signature(s) Signed: 02/08/2022 9:39:55 AM By: Fredirick Maudlin MD FACS Entered By: Fredirick Maudlin on 02/08/2022 09:39:55

## 2022-02-15 ENCOUNTER — Encounter (HOSPITAL_BASED_OUTPATIENT_CLINIC_OR_DEPARTMENT_OTHER): Payer: PPO | Admitting: General Surgery

## 2022-02-15 DIAGNOSIS — E11621 Type 2 diabetes mellitus with foot ulcer: Secondary | ICD-10-CM | POA: Diagnosis not present

## 2022-02-15 DIAGNOSIS — L97812 Non-pressure chronic ulcer of other part of right lower leg with fat layer exposed: Secondary | ICD-10-CM | POA: Diagnosis not present

## 2022-02-15 DIAGNOSIS — I872 Venous insufficiency (chronic) (peripheral): Secondary | ICD-10-CM | POA: Diagnosis not present

## 2022-02-15 DIAGNOSIS — L97822 Non-pressure chronic ulcer of other part of left lower leg with fat layer exposed: Secondary | ICD-10-CM | POA: Diagnosis not present

## 2022-02-15 NOTE — Progress Notes (Signed)
John [name] ([id number]) , Visit Report for 02/15/2022 Arrival Information Details Patient Name: Date of Service: John [name] 02/15/2022 9:00 A M Medical Record Number: [id number] Patient Account Number: [id number] Date of Birth/Sex: Treating RN: 1951/03/21 (71 y.o. [name] Primary Care Elenore Wanninger: Claretta Fraise Other Clinician: Referring Nomie Buchberger: Treating Shalondra Wunschel/Extender: Lucienne Capers in Treatment: 2 Visit Information History Since Last Visit Added or deleted any medications: No Patient Arrived: Ambulatory Any new allergies or adverse reactions: No Arrival Time: 08:46 Had a fall or experienced change in No Accompanied By: wife activities of daily living that may affect Transfer Assistance: None risk of falls: Patient Identification Verified: Yes Signs or symptoms of abuse/neglect since last visito No Secondary Verification Process Completed: Yes Hospitalized since last visit: No Patient Has Alerts: Yes Implantable device outside of the clinic excluding No Patient Alerts: Patient on Blood Thinner cellular tissue based products placed in the center ABIs non comp bilat since last visit: Has Dressing in Place as Prescribed: Yes Has Compression in Place as Prescribed: Yes Pain Present Now: No Electronic Signature(s) Signed: 02/15/2022 5:12:12 PM By: Levan Hurst RN, BSN Entered By: Levan Hurst on 02/15/2022 08:47:11 -------------------------------------------------------------------------------- Encounter Discharge Information Details Patient Name: Date of Service: John Spell L. 02/15/2022 9:00 A M Medical Record Number: [id number] Patient Account Number: [id number] Date of Birth/Sex: Treating RN: [birth year] (72 y.o. [name] Primary Care Dominika Losey: Claretta Fraise Other Clinician: Referring Jaymarie Yeakel: Treating Lasheka Kempner/Extender: Lucienne Capers in Treatment: 2 Encounter Discharge  Information Items Post Procedure Vitals Discharge Condition: Stable Temperature (F): 97.2 Ambulatory Status: Ambulatory Pulse (bpm): 96 Discharge Destination: Home Respiratory Rate (breaths/min): 22 Transportation: Private Auto Blood Pressure (mmHg): 91/63 Accompanied By: wife Schedule Follow-up Appointment: Yes Clinical Summary of Care: Patient Declined Electronic Signature(s) Signed: 02/15/2022 5:12:12 PM By: Levan Hurst RN, BSN Entered By: Levan Hurst on 02/15/2022 16:21:56 -------------------------------------------------------------------------------- Lower Extremity Assessment Details Patient Name: Date of Service: John Spell L. 02/15/2022 9:00 A M Medical Record Number: [id number] Patient Account Number: [id number] Date of Birth/Sex: Treating RN: May 24, 1951 (71 y.o. [name] Primary Care Vail Basista: Claretta Fraise Other Clinician: Referring Kyrell Ruacho: Treating Bronda Alfred/Extender: Lucienne Capers in Treatment: 2 Edema Assessment Assessed: Shirlyn Goltz: No] [Right: No] Edema: [Left: Yes] [Right: Yes] Calf Left: Right: Point of Measurement: 36 cm From Medial Instep 33.4 cm 32 cm Ankle Left: Right: Point of Measurement: 12 cm From Medial Instep 22.5 cm 23 cm Vascular Assessment Pulses: Dorsalis Pedis Palpable: [Left:Yes] [Right:Yes] Electronic Signature(s) Signed: 02/15/2022 5:12:12 PM By: Levan Hurst RN, BSN Entered By: Levan Hurst on 02/15/2022 09:00:41 -------------------------------------------------------------------------------- Multi Wound Chart Details Patient Name: Date of Service: John Spell L. 02/15/2022 9:00 A M Medical Record Number: [id number] Patient Account Number: [id number] Date of Birth/Sex: Treating RN: [birth year] (71 y.o. [name] Primary Care Stevin Bielinski: Claretta Fraise Other Clinician: Referring Dany Walther: Treating Hillery Zachman/Extender: Lucienne Capers in Treatment: 2 Vital  Signs Height(in): 56 Pulse(bpm): 91 Weight(lbs): 180 Blood Pressure(mmHg): 91/63 Body Mass Index(BMI): 25.1 Temperature(F): Respiratory Rate(breaths/min): 22 Photos: Left, Anterior Lower Leg Left, Medial Lower Leg Left, Proximal, Posterior Lower Leg Wound Location: Blister Blister Blister Wounding Event: Venous Leg Ulcer Venous Leg Ulcer Venous Leg Ulcer Primary Etiology: Glaucoma, Anemia, Arrhythmia, Glaucoma, Anemia, Arrhythmia, Glaucoma, Anemia, Arrhythmia, Comorbid History: Congestive Heart Failure, Coronary Congestive Heart Failure, Coronary Congestive Heart Failure, Coronary Artery Disease, Hypertension, Artery Disease, Hypertension, Artery Disease, Hypertension, Peripheral Venous Disease, Type II Peripheral Venous  Disease, Type II Peripheral Venous Disease, Type II Diabetes, Osteoarthritis Diabetes, Osteoarthritis Diabetes, Osteoarthritis 01/24/2022 01/24/2022 01/24/2022 Date Acquired: 2 2 2  Weeks of Treatment: Open Open Open Wound Status: No No No Wound Recurrence: No No No Clustered Wound: N/A N/A N/A Clustered Quantity: 5.6x8.5x0.1 2.4x4x0.1 0.5x0.4x0.1 Measurements L x W x D (cm) 37.385 7.54 0.157 A (cm) : rea 3.738 0.754 0.016 Volume (cm) : -2.60% 28.90% 93.80% % Reduction in A rea: -2.60% 28.90% 93.60% % Reduction in Volume: Full Thickness Without Exposed Full Thickness Without Exposed Full Thickness Without Exposed Classification: Support Structures Support Structures Support Structures Medium Large Medium Exudate A mount: Serosanguineous Serous Serosanguineous Exudate Type: red, brown amber red, brown Exudate Color: Flat and Intact Flat and Intact Flat and Intact Wound Margin: Large (67-100%) Large (67-100%) Large (67-100%) Granulation A mount: Red, Pink Pink Red, Pink Granulation Quality: None Present (0%) Small (1-33%) Small (1-33%) Necrotic A mount: Fat Layer (Subcutaneous Tissue): Yes Fat Layer (Subcutaneous Tissue): Yes Fat Layer  (Subcutaneous Tissue): Yes Exposed Structures: Fascia: No Fascia: No Fascia: No Tendon: No Tendon: No Tendon: No Muscle: No Muscle: No Muscle: No Joint: No Joint: No Joint: No Bone: No Bone: No Bone: No Small (1-33%) Small (1-33%) Medium (34-66%) Epithelialization: Debridement - Excisional Debridement - Excisional N/A Debridement: Pre-procedure Verification/Time Out 09:17 09:17 N/A Taken: Subcutaneous, Slough Subcutaneous, Slough N/A Tissue Debrided: Skin/Subcutaneous Tissue Skin/Subcutaneous Tissue N/A Level: 47.6 9.6 N/A Debridement A (sq cm): rea Curette Curette N/A Instrument: Minimum Minimum N/A Bleeding: Pressure Pressure N/A Hemostasis A chieved: 4 4 N/A Procedural Pain: 2 2 N/A Post Procedural Pain: Procedure was tolerated well Procedure was tolerated well N/A Debridement Treatment Response: 5.6x8.5x0.1 2.4x4x0.1 N/A Post Debridement Measurements L x W x D (cm) 3.738 0.754 N/A Post Debridement Volume: (cm) Debridement Debridement N/A Procedures Performed: Wound Number: 5 N/A N/A Photos: N/A N/A Right, Circumferential Lower Leg N/A N/A Wound Location: Blister N/A N/A Wounding Event: Venous Leg Ulcer N/A N/A Primary Etiology: Glaucoma, Anemia, Arrhythmia, N/A N/A Comorbid History: Congestive Heart Failure, Coronary Artery Disease, Hypertension, Peripheral Venous Disease, Type II Diabetes, Osteoarthritis 01/24/2022 N/A N/A Date Acquired: 2 N/A N/A Weeks of Treatment: Open N/A N/A Wound Status: No N/A N/A Wound Recurrence: Yes N/A N/A Clustered Wound: 6 N/A N/A Clustered Quantity: 12x17.5x0.1 N/A N/A Measurements L x W x D (cm) 164.934 N/A N/A A (cm) : rea 16.493 N/A N/A Volume (cm) : 51.70% N/A N/A % Reduction in Area: 51.70% N/A N/A % Reduction in Volume: Full Thickness Without Exposed N/A N/A Classification: Support Structures Large N/A N/A Exudate Amount: Serous N/A N/A Exudate Type: amber N/A N/A Exudate  Color: Flat and Intact N/A N/A Wound Margin: Medium (34-66%) N/A N/A Granulation Amount: Red, Pink N/A N/A Granulation Quality: Medium (34-66%) N/A N/A Necrotic Amount: Fat Layer (Subcutaneous Tissue): Yes N/A N/A Exposed Structures: Fascia: No Tendon: No Muscle: No Joint: No Bone: No Small (1-33%) N/A N/A Epithelialization: Debridement - Excisional N/A N/A Debridement: Pre-procedure Verification/Time Out 09:17 N/A N/A Taken: Necrotic/Eschar, Subcutaneous, N/A N/A Tissue Debrided: Slough Skin/Subcutaneous Tissue N/A N/A Level: 40 N/A N/A Debridement A (sq cm): rea Curette N/A N/A Instrument: Minimum N/A N/A Bleeding: Pressure N/A N/A Hemostasis Achieved: 4 N/A N/A Procedural Pain: 2 N/A N/A Post Procedural Pain: Debridement Treatment Response: Procedure was tolerated well N/A N/A Post Debridement Measurements L x 12x17.5x0.1 N/A N/A W x D (cm) 16.493 N/A N/A Post Debridement Volume: (cm) Debridement N/A N/A Procedures Performed: Treatment Notes Electronic Signature(s) Signed: 02/15/2022 9:26:42 AM By: 04/17/2022,  Anderson Malta MD FACS Signed: 02/15/2022 5:12:12 PM By: Levan Hurst RN, BSN Entered By: Fredirick Maudlin on 02/15/2022 09:26:41 -------------------------------------------------------------------------------- Multi-Disciplinary Care Plan Details Patient Name: Date of Service: John Spell L. 02/15/2022 9:00 A M Medical Record Number: [id number] Patient Account Number: [id number] Date of Birth/Sex: Treating RN: [birth year] (71 y.o. [name] Primary Care Carl Bleecker: Claretta Fraise Other Clinician: Referring Ahmadou Bolz: Treating Hugh Kamara/Extender: Lucienne Capers in Treatment: 2 Multidisciplinary Care Plan reviewed with physician Active Inactive Abuse / Safety / Falls / Self Care Management Nursing Diagnoses: Potential for falls Potential for injury related to falls Goals: Patient will not experience any injury related  to falls Date Initiated: 01/31/2022 Target Resolution Date: 03/02/2022 Goal Status: Active Patient/caregiver will verbalize/demonstrate measures taken to prevent injury and/or falls Date Initiated: 01/31/2022 Date Inactivated: 02/15/2022 Target Resolution Date: 03/02/2022 Goal Status: Met Interventions: Assess Activities of Daily Living upon admission and as needed Assess fall risk on admission and as needed Assess: immobility, friction, shearing, incontinence upon admission and as needed Assess impairment of mobility on admission and as needed per policy Assess personal safety and home safety (as indicated) on admission and as needed Assess self care needs on admission and as needed Provide education on personal and home safety Notes: Nutrition Nursing Diagnoses: Impaired glucose control: actual or potential Potential for alteratiion in Nutrition/Potential for imbalanced nutrition Goals: Patient/caregiver agrees to and verbalizes understanding of need to use nutritional supplements and/or vitamins as prescribed Date Initiated: 01/31/2022 Date Inactivated: 02/15/2022 Target Resolution Date: 03/02/2022 Goal Status: Met Patient/caregiver will maintain therapeutic glucose control Date Initiated: 01/31/2022 Target Resolution Date: 03/02/2022 Goal Status: Active Interventions: Assess HgA1c results as ordered upon admission and as needed Assess patient nutrition upon admission and as needed per policy Provide education on elevated blood sugars and impact on wound healing Provide education on nutrition Treatment Activities: Education provided on Nutrition : 01/31/2022 Notes: Venous Leg Ulcer Nursing Diagnoses: Knowledge deficit related to disease process and management Goals: Patient will maintain optimal edema control Date Initiated: 01/31/2022 Target Resolution Date: 03/02/2022 Goal Status: Active Patient/caregiver will verbalize understanding of disease process and disease  management Date Initiated: 01/31/2022 Target Resolution Date: 03/02/2022 Goal Status: Active Interventions: Assess peripheral edema status every visit. Compression as ordered Provide education on venous insufficiency Notes: Wound/Skin Impairment Nursing Diagnoses: Impaired tissue integrity Knowledge deficit related to ulceration/compromised skin integrity Goals: Patient/caregiver will verbalize understanding of skin care regimen Date Initiated: 01/31/2022 Target Resolution Date: 03/02/2022 Goal Status: Active Interventions: Assess patient/caregiver ability to obtain necessary supplies Assess patient/caregiver ability to perform ulcer/skin care regimen upon admission and as needed Assess ulceration(s) every visit Provide education on ulcer and skin care Notes: Electronic Signature(s) Signed: 02/15/2022 5:12:12 PM By: Levan Hurst RN, BSN Entered By: Levan Hurst on 02/15/2022 08:42:30 -------------------------------------------------------------------------------- Pain Assessment Details Patient Name: Date of Service: John Spell L. 02/15/2022 9:00 A M Medical Record Number: [id number] Patient Account Number: [id number] Date of Birth/Sex: Treating RN: March 17, 1951 (71 y.o. [name] Primary Care Daylan Juhnke: Claretta Fraise Other Clinician: Referring Marcos Ruelas: Treating Shantel Helwig/Extender: Lucienne Capers in Treatment: 2 Active Problems Location of Pain Severity and Description of Pain Patient Has Paino No Site Locations Pain Management and Medication Current Pain Management: Electronic Signature(s) Signed: 02/15/2022 5:12:12 PM By: Levan Hurst RN, BSN Entered By: Levan Hurst on 02/15/2022 08:48:37 -------------------------------------------------------------------------------- Patient/Caregiver Education Details Patient Name: Date of Service: John [name] 6/8/2023andnbsp9:00 A M Medical Record Number: [id number] Patient Account  Number: [id number]  Date of Birth/Gender: Treating RN: [birth year] (71 y.o. [name] Primary Care Physician: Claretta Fraise Other Clinician: Referring Physician: Treating Physician/Extender: Lucienne Capers in Treatment: 2 Education Assessment Education Provided To: Patient Education Topics Provided Wound/Skin Impairment: Methods: Explain/Verbal Responses: State content correctly Electronic Signature(s) Signed: 02/15/2022 5:12:12 PM By: Levan Hurst RN, BSN Entered By: Levan Hurst on 02/15/2022 08:42:42 -------------------------------------------------------------------------------- Wound Assessment Details Patient Name: Date of Service: John Spell L. 02/15/2022 9:00 A M Medical Record Number: [id number] Patient Account Number: [id number] Date of Birth/Sex: Treating RN: 05/02/1951 (71 y.o. [name] Primary Care Jentry Mcqueary: Claretta Fraise Other Clinician: Referring Drusilla Wampole: Treating Kinsleigh Ludolph/Extender: Lucienne Capers in Treatment: 2 Wound Status Wound Number: 1 Primary Venous Leg Ulcer Etiology: Wound Location: Left, Anterior Lower Leg Wound Open Wounding Event: Blister Status: Date Acquired: 01/24/2022 Comorbid Glaucoma, Anemia, Arrhythmia, Congestive Heart Failure, Coronary Weeks Of Treatment: 2 History: Artery Disease, Hypertension, Peripheral Venous Disease, Type II Clustered Wound: No Diabetes, Osteoarthritis Photos Wound Measurements Length: (cm) 5.6 Width: (cm) 8.5 Depth: (cm) 0.1 Area: (cm) 37.385 Volume: (cm) 3.738 % Reduction in Area: -2.6% % Reduction in Volume: -2.6% Epithelialization: Small (1-33%) Tunneling: No Undermining: No Wound Description Classification: Full Thickness Without Exposed Support Structures Wound Margin: Flat and Intact Exudate Amount: Medium Exudate Type: Serosanguineous Exudate Color: red, brown Foul Odor After Cleansing: No Slough/Fibrino No Wound  Bed Granulation Amount: Large (67-100%) Exposed Structure Granulation Quality: Red, Pink Fascia Exposed: No Necrotic Amount: None Present (0%) Fat Layer (Subcutaneous Tissue) Exposed: Yes Tendon Exposed: No Muscle Exposed: No Joint Exposed: No Bone Exposed: No Treatment Notes Wound #1 (Lower Leg) Wound Laterality: Left, Anterior Cleanser Soap and Water Discharge Instruction: May shower and wash wound with dial antibacterial soap and water prior to dressing change. Wound Cleanser Discharge Instruction: Cleanse the wound with wound cleanser prior to applying a clean dressing using gauze sponges, not tissue or cotton balls. Peri-Wound Care Topical Primary Dressing Maxorb Extra Calcium Alginate Dressing, 4x4 in Discharge Instruction: Apply calcium alginate to wound bed as instructed Secondary Dressing ABD Pad, 8x10 Discharge Instruction: Apply over primary dressing as directed. Woven Gauze Sponge, Non-Sterile 4x4 in Discharge Instruction: Apply over primary dressing as directed. Secured With Elastic Bandage 4 inch (ACE bandage) Discharge Instruction: Secure with ACE bandage as directed. Kerlix Roll Sterile, 4.5x3.1 (in/yd) Discharge Instruction: Secure with Kerlix as directed. Paper Tape, 2x10 (in/yd) Discharge Instruction: Secure dressing with tape as directed. Stretch Net Size 5, 10 (yds) Compression Wrap Compression Stockings Add-Ons Electronic Signature(s) Signed: 02/15/2022 5:12:12 PM By: Levan Hurst RN, BSN Entered By: Levan Hurst on 02/15/2022 09:03:49 -------------------------------------------------------------------------------- Wound Assessment Details Patient Name: Date of Service: John Spell L. 02/15/2022 9:00 A M Medical Record Number: [id number] Patient Account Number: [id number] Date of Birth/Sex: Treating RN: March 16, 1951 (71 y.o. [name] Primary Care [place]: Claretta Fraise Other Clinician: Referring Graysin Luczynski: Treating  Airam Runions/Extender: Lucienne Capers in Treatment: 2 Wound Status Wound Number: 2 Primary Venous Leg Ulcer Etiology: Wound Location: Left, Medial Lower Leg Wound Open Wounding Event: Blister Status: Date Acquired: 01/24/2022 Comorbid Glaucoma, Anemia, Arrhythmia, Congestive Heart Failure, Coronary Weeks Of Treatment: 2 History: Artery Disease, Hypertension, Peripheral Venous Disease, Type II Clustered Wound: No Diabetes, Osteoarthritis Photos Wound Measurements Length: (cm) 2.4 Width: (cm) 4 Depth: (cm) 0.1 Area: (cm) 7.54 Volume: (cm) 0.754 % Reduction in Area: 28.9% % Reduction in Volume: 28.9% Epithelialization: Small (1-33%) Tunneling: No Undermining: No Wound Description Classification: Full Thickness Without Exposed  Support Structures Wound Margin: Flat and Intact Exudate Amount: Large Exudate Type: Serous Exudate Color: amber Foul Odor After Cleansing: No Slough/Fibrino Yes Wound Bed Granulation Amount: Large (67-100%) Exposed Structure Granulation Quality: Pink Fascia Exposed: No Necrotic Amount: Small (1-33%) Fat Layer (Subcutaneous Tissue) Exposed: Yes Necrotic Quality: Adherent Slough Tendon Exposed: No Muscle Exposed: No Joint Exposed: No Bone Exposed: No Treatment Notes Wound #2 (Lower Leg) Wound Laterality: Left, Medial Cleanser Soap and Water Discharge Instruction: May shower and wash wound with dial antibacterial soap and water prior to dressing change. Wound Cleanser Discharge Instruction: Cleanse the wound with wound cleanser prior to applying a clean dressing using gauze sponges, not tissue or cotton balls. Peri-Wound Care Topical Primary Dressing Maxorb Extra Calcium Alginate Dressing, 4x4 in Discharge Instruction: Apply calcium alginate to wound bed as instructed Secondary Dressing ABD Pad, 5x9 Discharge Instruction: Apply over primary dressing as directed. Woven Gauze Sponge, Non-Sterile 4x4 in Discharge  Instruction: Apply over primary dressing as directed. Secured With Elastic Bandage 4 inch (ACE bandage) Discharge Instruction: Secure with ACE bandage as directed. Kerlix Roll Sterile, 4.5x3.1 (in/yd) Discharge Instruction: Secure with Kerlix as directed. Paper Tape, 2x10 (in/yd) Discharge Instruction: Secure dressing with tape as directed. Compression Wrap Compression Stockings Add-Ons Electronic Signature(s) Signed: 02/15/2022 5:12:12 PM By: Levan Hurst RN, BSN Entered By: Levan Hurst on 02/15/2022 09:04:20 -------------------------------------------------------------------------------- Wound Assessment Details Patient Name: Date of Service: John [name]. 02/15/2022 9:00 A M Medical Record Number: [id number] Patient Account Number: [id number] Date of Birth/Sex: Treating RN: [birth year] (71 y.o. [name] Primary Care Jozeph Persing: Claretta Fraise Other Clinician: Referring Pawel Soules: Treating Tanika Bracco/Extender: Lucienne Capers in Treatment: 2 Wound Status Wound Number: 3 Primary Venous Leg Ulcer Etiology: Wound Location: Left, Proximal, Posterior Lower Leg Wound Open Wounding Event: Blister Status: Date Acquired: 01/24/2022 Comorbid Glaucoma, Anemia, Arrhythmia, Congestive Heart Failure, Coronary Weeks Of Treatment: 2 History: Artery Disease, Hypertension, Peripheral Venous Disease, Type II Clustered Wound: No Diabetes, Osteoarthritis Photos Wound Measurements Length: (cm) 0.5 Width: (cm) 0.4 Depth: (cm) 0.1 Area: (cm) 0.157 Volume: (cm) 0.016 % Reduction in Area: 93.8% % Reduction in Volume: 93.6% Epithelialization: Medium (34-66%) Tunneling: No Wound Description Classification: Full Thickness Without Exposed Support Structures Wound Margin: Flat and Intact Exudate Amount: Medium Exudate Type: Serosanguineous Exudate Color: red, brown Foul Odor After Cleansing: No Slough/Fibrino Yes Wound Bed Granulation Amount: Large  (67-100%) Exposed Structure Granulation Quality: Red, Pink Fascia Exposed: No Necrotic Amount: Small (1-33%) Fat Layer (Subcutaneous Tissue) Exposed: Yes Necrotic Quality: Adherent Slough Tendon Exposed: No Muscle Exposed: No Joint Exposed: No Bone Exposed: No Treatment Notes Wound #3 (Lower Leg) Wound Laterality: Left, Posterior, Proximal Cleanser Soap and Water Discharge Instruction: May shower and wash wound with dial antibacterial soap and water prior to dressing change. Wound Cleanser Discharge Instruction: Cleanse the wound with wound cleanser prior to applying a clean dressing using gauze sponges, not tissue or cotton balls. Peri-Wound Care Topical Primary Dressing Maxorb Extra Calcium Alginate Dressing, 4x4 in Discharge Instruction: Apply calcium alginate to wound bed as instructed Secondary Dressing ABD Pad, 5x9 Discharge Instruction: Apply over primary dressing as directed. Woven Gauze Sponge, Non-Sterile 4x4 in Discharge Instruction: Apply over primary dressing as directed. Secured With Elastic Bandage 4 inch (ACE bandage) Discharge Instruction: Secure with ACE bandage as directed. Kerlix Roll Sterile, 4.5x3.1 (in/yd) Discharge Instruction: Secure with Kerlix as directed. Paper Tape, 2x10 (in/yd) Discharge Instruction: Secure dressing with tape as directed. Compression Wrap Compression Stockings Add-Ons Electronic Signature(s) Signed: 02/15/2022 5:12:12  PM By: Levan Hurst RN, BSN Entered By: Levan Hurst on 02/15/2022 09:04:45 -------------------------------------------------------------------------------- Wound Assessment Details Patient Name: Date of Service: John Bevely Palmer L. 02/15/2022 9:00 A M Medical Record Number: [id number] Patient Account Number: [id number] Date of Birth/Sex: Treating RN: 01-25-1951 (71 y.o. [name] Primary Care Raynetta Osterloh: Claretta Fraise Other Clinician: Referring Denetria Luevanos: Treating Raahil Ong/Extender: Lucienne Capers in Treatment: 2 Wound Status Wound Number: 5 Primary Venous Leg Ulcer Etiology: Wound Location: Right, Circumferential Lower Leg Wound Open Wounding Event: Blister Status: Date Acquired: 01/24/2022 Comorbid Glaucoma, Anemia, Arrhythmia, Congestive Heart Failure, Weeks Of Treatment: 2 History: Coronary Artery Disease, Hypertension, Peripheral Venous Clustered Wound: Yes Disease, Type II Diabetes, Osteoarthritis Photos Wound Measurements Length: (cm) 12 Width: (cm) 17.5 Depth: (cm) 0.1 Clustered Quantity: 6 Area: (cm) 164.934 Volume: (cm) 16.493 % Reduction in Area: 51.7% % Reduction in Volume: 51.7% Epithelialization: Small (1-33%) Tunneling: No Undermining: No Wound Description Classification: Full Thickness Without Exposed Support Structures Wound Margin: Flat and Intact Exudate Amount: Large Exudate Type: Serous Exudate Color: amber Foul Odor After Cleansing: No Slough/Fibrino Yes Wound Bed Granulation Amount: Medium (34-66%) Exposed Structure Granulation Quality: Red, Pink Fascia Exposed: No Necrotic Amount: Medium (34-66%) Fat Layer (Subcutaneous Tissue) Exposed: Yes Necrotic Quality: Adherent Slough Tendon Exposed: No Muscle Exposed: No Joint Exposed: No Bone Exposed: No Treatment Notes Wound #5 (Lower Leg) Wound Laterality: Right, Circumferential Cleanser Soap and Water Discharge Instruction: May shower and wash wound with dial antibacterial soap and water prior to dressing change. Wound Cleanser Discharge Instruction: Cleanse the wound with wound cleanser prior to applying a clean dressing using gauze sponges, not tissue or cotton balls. Peri-Wound Care Topical Primary Dressing Maxorb Extra Calcium Alginate Dressing, 4x4 in Discharge Instruction: Apply calcium alginate to wound bed as instructed Secondary Dressing ABD Pad, 8x10 Discharge Instruction: Apply over primary dressing as directed. Woven Gauze Sponge,  Non-Sterile 4x4 in Discharge Instruction: Apply over primary dressing as directed. Secured With Elastic Bandage 4 inch (ACE bandage) Discharge Instruction: Secure with ACE bandage as directed. Kerlix Roll Sterile, 4.5x3.1 (in/yd) Discharge Instruction: Secure with Kerlix as directed. Paper Tape, 2x10 (in/yd) Discharge Instruction: Secure dressing with tape as directed. Compression Wrap Compression Stockings Add-Ons Electronic Signature(s) Signed: 02/15/2022 5:12:12 PM By: Levan Hurst RN, BSN Entered By: Levan Hurst on 02/15/2022 09:05:17 -------------------------------------------------------------------------------- Vitals Details Patient Name: Date of Service: John Spell L. 02/15/2022 9:00 A M Medical Record Number: [id number] Patient Account Number: [id number] Date of Birth/Sex: Treating RN: 1951-04-29 (71 y.o. [name] Primary Care Garland Hinc[place]: Claretta Fraise Other Clinician: Referring Zandyr Barnhill: Treating Demaryius Imran/Extender: Lucienne Capers in Treatment: 2 Vital Signs Time Taken: 08:46 Pulse (bpm): 96 Height (in): 71 Respiratory Rate (breaths/min): 22 Weight (lbs): 180 Blood Pressure (mmHg): 91/63 Body Mass Index (BMI): 25.1 Reference Range: 80 - 120 mg / dl Electronic Signature(s) Signed: 02/15/2022 5:12:12 PM By: Levan Hurst RN, BSN Entered By: Levan Hurst on 02/15/2022 08:48:32

## 2022-02-15 NOTE — Progress Notes (Signed)
John [name] ([id number]) , Visit Report for 02/15/2022 Chief Complaint Document Details Patient Name: Date of Service: John [name] 02/15/2022 9:00 A M Medical Record Number: [id number] Patient Account Number: [id number] Date of Birth/Sex: Treating RN: Aug 29, 1951 (71 y.o. John [name] Primary Care Provider: Claretta [name] Other Clinician: Referring Provider: Treating Provider/Extender: John [name] in Treatment: 2 Information Obtained from: Patient Chief Complaint Patient presents for treatment of multiple open ulcers due to venous insufficiency and lymphedema Electronic Signature(s) Signed: 02/15/2022 9:26:48 AM By: John Maudlin MD FACS Entered By: John [name] on 02/15/2022 09:26:48 -------------------------------------------------------------------------------- Debridement Details Patient Name: Date of Service: John Spell L. 02/15/2022 9:00 A M Medical Record Number: [id number] Patient Account Number: [id number] Date of Birth/Sex: Treating RN: Apr 19, 1951 (71 y.o. John [name] Primary Care Provider: Claretta [name] Other Clinician: Referring Provider: Treating Provider/Extender: John [name] in Treatment: 2 Debridement Performed for Assessment: Wound #1 Left,Anterior Lower Leg Performed By: Physician John Maudlin, MD Debridement Type: Debridement Severity of Tissue Pre Debridement: Fat layer exposed Level of Consciousness (Pre-procedure): Awake and Alert Pre-procedure Verification/Time Out Yes - 09:17 Taken: Start Time: 09:17 T Area Debrided (L x W): otal 5.6 (cm) x 8.5 (cm) = 47.6 (cm) Tissue and other material debrided: Non-Viable, Slough, Subcutaneous, Biofilm, Slough Level: Skin/Subcutaneous Tissue Debridement Description: Excisional Instrument: Curette Bleeding: Minimum Hemostasis Achieved: Pressure End Time: 09:18 Procedural Pain: 4 Post Procedural Pain: 2 Response to Treatment:  Procedure was tolerated well Level of Consciousness (Post- Awake and Alert procedure): Post Debridement Measurements of Total Wound Length: (cm) 5.6 Width: (cm) 8.5 Depth: (cm) 0.1 Volume: (cm) 3.738 Character of Wound/Ulcer Post Debridement: Improved Severity of Tissue Post Debridement: Fat layer exposed Post Procedure Diagnosis Same as Pre-procedure Electronic Signature(s) Signed: 02/15/2022 12:34:21 PM By: John Maudlin MD FACS Signed: 02/15/2022 5:12:12 PM By: John Hurst RN, BSN Entered By: John [name] on 02/15/2022 09:20:29 -------------------------------------------------------------------------------- Debridement Details Patient Name: Date of Service: John Spell L. 02/15/2022 9:00 A M Medical Record Number: [id number] Patient Account Number: [id number] Date of Birth/Sex: Treating RN: 1951-08-09 (71 y.o. John [name] Primary Care Provider: Claretta [name] Other Clinician: Referring Provider: Treating Provider/Extender: John [name] in Treatment: 2 Debridement Performed for Assessment: Wound #2 Left,Medial Lower Leg Performed By: Physician John Maudlin, MD Debridement Type: Debridement Severity of Tissue Pre Debridement: Fat layer exposed Level of Consciousness (Pre-procedure): Awake and Alert Pre-procedure Verification/Time Out Yes - 09:17 Taken: Start Time: 09:18 T Area Debrided (L x W): otal 2.4 (cm) x 4 (cm) = 9.6 (cm) Tissue and other material debrided: Non-Viable, Slough, Subcutaneous, Biofilm, Slough Level: Skin/Subcutaneous Tissue Debridement Description: Excisional Instrument: Curette Bleeding: Minimum Hemostasis Achieved: Pressure End Time: 09:19 Procedural Pain: 4 Post Procedural Pain: 2 Response to Treatment: Procedure was tolerated well Level of Consciousness (Post- Awake and Alert procedure): Post Debridement Measurements of Total Wound Length: (cm) 2.4 Width: (cm) 4 Depth: (cm) 0.1 Volume: (cm)  0.754 Character of Wound/Ulcer Post Debridement: Improved Severity of Tissue Post Debridement: Fat layer exposed Post Procedure Diagnosis Same as Pre-procedure Electronic Signature(s) Signed: 02/15/2022 12:34:21 PM By: John Maudlin MD FACS Signed: 02/15/2022 5:12:12 PM By: John Hurst RN, BSN Entered By: John [name] on 02/15/2022 09:20:37 -------------------------------------------------------------------------------- Debridement Details Patient Name: Date of Service: John Spell L. 02/15/2022 9:00 A M Medical Record Number: [id number] Patient Account Number: [id number] Date of Birth/Sex: Treating RN: [birth year] (72 y.o. John [name] Primary Care Provider: Claretta [name] Other Clinician:  Referring Provider: Treating Provider/Extender: John [name] in Treatment: 2 Debridement Performed for Assessment: Wound #5 Right,Circumferential Lower Leg Performed By: Physician John Maudlin, MD Debridement Type: Debridement Severity of Tissue Pre Debridement: Fat layer exposed Level of Consciousness (Pre-procedure): Awake and Alert Pre-procedure Verification/Time Out Yes - 09:17 Taken: Start Time: 09:19 T Area Debrided (L x W): otal 5 (cm) x 8 (cm) = 40 (cm) Tissue and other material debrided: Non-Viable, Eschar, Slough, Subcutaneous, Biofilm, Slough Level: Skin/Subcutaneous Tissue Debridement Description: Excisional Instrument: Curette Bleeding: Minimum Hemostasis Achieved: Pressure End Time: 09:22 Procedural Pain: 4 Post Procedural Pain: 2 Response to Treatment: Procedure was tolerated well Level of Consciousness (Post- Awake and Alert procedure): Post Debridement Measurements of Total Wound Length: (cm) 12 Width: (cm) 17.5 Depth: (cm) 0.1 Volume: (cm) 16.493 Character of Wound/Ulcer Post Debridement: Improved Severity of Tissue Post Debridement: Fat layer exposed Post Procedure Diagnosis Same as Pre-procedure Electronic  Signature(s) Signed: 02/15/2022 12:34:21 PM By: John Maudlin MD FACS Signed: 02/15/2022 5:12:12 PM By: John Hurst RN, BSN Entered By: John [name] on 02/15/2022 09:20:43 -------------------------------------------------------------------------------- HPI Details Patient Name: Date of Service: John Spell L. 02/15/2022 9:00 A M Medical Record Number: [id number] Patient Account Number: [id number] Date of Birth/Sex: Treating RN: 1951/02/14 (71 y.o. John [name] Primary Care Provider: Claretta [name] Other Clinician: Referring Provider: Treating Provider/Extender: John [name] in Treatment: 2 History of Present Illness HPI Description: ADMISSION 01/31/2022 This is a 71 year old man with severe systolic heart failure (ejection fraction roughly 20%) stage III chronic kidney disease, chronic diastolic dysfunction type 2 diabetes mellitus (last hemoglobin A1c 7.0% on November 17, 2021), lymphedema, hypertension, and chronic atrial fibrillation. He has had massive fluid shifts over the past 1 to 2 weeks. He is on torsemide and does have a protocol for increasing his dose if his weight fluctuates a certain number of pounds. Despite this, his legs have swollen tremendously and he has had multiple blisters open and breakdown bilaterally. He is here today for further evaluation of these wounds. On his right leg, he has nearly circumferential wounds. Some of these are limited to skin breakdown, while others have the fat layer exposed. There is slough accumulation in the deeper wounds. He has similar wounds, albeit less extensive, on the left. He says that his legs feel like they are burning. 02/08/2022: Over the past week, the patient has lost 13 pounds of fluid. His wife has been changing his dressings. She reports that they never received any of the calcium alginate that was ordered. She has just been using ABD pads and Kerlix. In spite of this, however, all of his  wounds are a little bit smaller and they have clean surfaces. There is just a little bit of eschar and slough accumulation on a couple of the open areas. He continues to complain of intense burning and tingling in his legs and feet. 02/15/2022: All of the wounds are smaller today. His wife did find the calcium alginate in her wound care supplies. There is still slough accumulation on all of the wound surfaces. There is a little bit of eschar adherent to a couple of the sites. He continues to complain of intense burning and tingling in his legs and feet; he has an appointment with his primary care provider next week to address his peripheral neuropathy. Electronic Signature(s) Signed: 02/15/2022 9:27:44 AM By: John Maudlin MD FACS Entered By: John [name] on 02/15/2022 09:27:44 -------------------------------------------------------------------------------- Physical Exam Details Patient Name: Date of Service: John  Deretha Emory HNNY L. 02/15/2022 9:00 A M Medical Record Number: [id number] Patient Account Number: [id number] Date of Birth/Sex: Treating RN: 09-Aug-1951 (71 y.o. John [name] Primary Care Provider: Claretta [name] Other Clinician: Referring Provider: Treating Provider/Extender: John [name] in Treatment: 2 Constitutional Within normal range for this patient. . . . No acute distress.Marland Kitchen Respiratory Normal work of breathing on room air.. Notes 02/15/2022: All of the wounds are smaller today. There is still slough accumulation on all of the wound surfaces. There is a little bit of eschar adherent to a couple of the sites. Electronic Signature(s) Signed: 02/15/2022 9:29:03 AM By: John Maudlin MD FACS Entered By: John [name] on 02/15/2022 09:29:03 -------------------------------------------------------------------------------- Physician Orders Details Patient Name: Date of Service: John Spell L. 02/15/2022 9:00 A M Medical Record Number:  [id number] Patient Account Number: [id number] Date of Birth/Sex: Treating RN: 21-Sep-1950 (71 y.o. John [name] Primary Care Provider: Claretta [name] Other Clinician: Referring Provider: Treating Provider/Extender: John [name] in Treatment: 2 Verbal / Phone Orders: No Diagnosis Coding ICD-10 Coding Code Description T73.22 Chronic diastolic (congestive) heart failure Hypertensive heart and chronic kidney disease with heart failure and stage 1 through stage 4 chronic kidney disease, or unspecified I13.0 chronic kidney disease N18.30 Chronic kidney disease, stage 3 unspecified E11.9 Type 2 diabetes mellitus without complications G25.4 Lymphedema, not elsewhere classified L97.822 Non-pressure chronic ulcer of other part of left lower leg with fat layer exposed L97.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 L97.811 Non-pressure chronic ulcer of other part of right lower leg limited to breakdown of skin Follow-up Appointments ppointment in 1 week. - Dr. Celine Ahr - Room 4 - Thursday 6/15 at 10:30 Return A Bathing/ Shower/ Hygiene May shower with protection but do not get wound dressing(s) wet. - Ok to use [employer], can buy at [facility], [facility], or [employer] Edema Control - Lymphedema / SCD / Other Elevate legs to the level of the heart or above for 30 minutes daily and/or when sitting, a frequency of: - throughout the day Avoid standing for long periods of time. Exercise regularly Wound Treatment Wound #1 - Lower Leg Wound Laterality: Left, Anterior Cleanser: Soap and Water Every Other Day/30 Days Discharge Instructions: May shower and wash wound with dial antibacterial soap and water prior to dressing change. Cleanser: Wound Cleanser Every Other Day/30 Days Discharge Instructions: Cleanse the wound with wound cleanser  prior to applying a clean dressing using gauze sponges, not tissue or cotton balls. Prim Dressing: Maxorb Extra Calcium Alginate Dressing, 4x4 in Every Other Day/30 Days ary Discharge Instructions: Apply calcium alginate to wound bed as instructed Secondary Dressing: ABD Pad, 8x10 (DME) (Generic) Every Other Day/30 Days Discharge Instructions: Apply over primary dressing as directed. Secondary Dressing: Woven Gauze Sponge, Non-Sterile 4x4 in Every Other Day/30 Days Discharge Instructions: Apply over primary dressing as directed. Secured With: Elastic Bandage 4 inch (ACE bandage) (DME) (Generic) Every Other Day/30 Days Discharge Instructions: Secure with ACE bandage as directed. Secured With: [employer], 4.5x3.1 (in/yd) Every Other Day/30 Days Discharge Instructions: Secure with Kerlix as directed. Secured With: Paper Tape, 2x10 (in/yd) Every Other Day/30 Days Discharge Instructions: Secure dressing with tape as directed. Secured With: [employer] Size 5, 10 (yds) (DME) (Generic) Every Other Day/30 Days Wound #2 - Lower Leg Wound Laterality: Left, Medial Cleanser: Soap and Water Every  Other Day/30 Days Discharge Instructions: May shower and wash wound with dial antibacterial soap and water prior to dressing change. Cleanser: Wound Cleanser Every Other Day/30 Days Discharge Instructions: Cleanse the wound with wound cleanser prior to applying a clean dressing using gauze sponges, not tissue or cotton balls. Prim Dressing: Maxorb Extra Calcium Alginate Dressing, 4x4 in Every Other Day/30 Days ary Discharge Instructions: Apply calcium alginate to wound bed as instructed Secondary Dressing: ABD Pad, 5x9 Every Other Day/30 Days Discharge Instructions: Apply over primary dressing as directed. Secondary Dressing: Woven Gauze Sponge, Non-Sterile 4x4 in Every Other Day/30 Days Discharge Instructions: Apply over primary dressing as directed. Secured With: Elastic Bandage 4 inch (ACE bandage)  (DME) (Generic) Every Other Day/30 Days Discharge Instructions: Secure with ACE bandage as directed. Secured With: [employer], 4.5x3.1 (in/yd) Every Other Day/30 Days Discharge Instructions: Secure with Kerlix as directed. Secured With: Paper Tape, 2x10 (in/yd) Every Other Day/30 Days Discharge Instructions: Secure dressing with tape as directed. Wound #3 - Lower Leg Wound Laterality: Left, Posterior, Proximal Cleanser: Soap and Water Every Other Day/30 Days Discharge Instructions: May shower and wash wound with dial antibacterial soap and water prior to dressing change. Cleanser: Wound Cleanser Every Other Day/30 Days Discharge Instructions: Cleanse the wound with wound cleanser prior to applying a clean dressing using gauze sponges, not tissue or cotton balls. Prim Dressing: Maxorb Extra Calcium Alginate Dressing, 4x4 in Every Other Day/30 Days ary Discharge Instructions: Apply calcium alginate to wound bed as instructed Secondary Dressing: ABD Pad, 5x9 Every Other Day/30 Days Discharge Instructions: Apply over primary dressing as directed. Secondary Dressing: Woven Gauze Sponge, Non-Sterile 4x4 in Every Other Day/30 Days Discharge Instructions: Apply over primary dressing as directed. Secured With: Elastic Bandage 4 inch (ACE bandage) (DME) (Generic) Every Other Day/30 Days Discharge Instructions: Secure with ACE bandage as directed. Secured With: [employer], 4.5x3.1 (in/yd) Every Other Day/30 Days Discharge Instructions: Secure with Kerlix as directed. Secured With: Paper Tape, 2x10 (in/yd) Every Other Day/30 Days Discharge Instructions: Secure dressing with tape as directed. Wound #5 - Lower Leg Wound Laterality: Right, Circumferential Cleanser: Soap and Water Every Other Day/30 Days Discharge Instructions: May shower and wash wound with dial antibacterial soap and water prior to dressing change. Cleanser: Wound Cleanser Every Other Day/30 Days Discharge  Instructions: Cleanse the wound with wound cleanser prior to applying a clean dressing using gauze sponges, not tissue or cotton balls. Prim Dressing: Maxorb Extra Calcium Alginate Dressing, 4x4 in Every Other Day/30 Days ary Discharge Instructions: Apply calcium alginate to wound bed as instructed Secondary Dressing: ABD Pad, 8x10 (DME) (Generic) Every Other Day/30 Days Discharge Instructions: Apply over primary dressing as directed. Secondary Dressing: Woven Gauze Sponge, Non-Sterile 4x4 in Every Other Day/30 Days Discharge Instructions: Apply over primary dressing as directed. Secured With: Elastic Bandage 4 inch (ACE bandage) (DME) (Generic) Every Other Day/30 Days Discharge Instructions: Secure with ACE bandage as directed. Secured With: [employer], 4.5x3.1 (in/yd) Every Other Day/30 Days Discharge Instructions: Secure with Kerlix as directed. Secured With: Paper Tape, 2x10 (in/yd) Every Other Day/30 Days Discharge Instructions: Secure dressing with tape as directed. Electronic Signature(s) Signed: 02/15/2022 12:34:21 PM By: John Maudlin MD FACS Signed: 02/15/2022 5:12:12 PM By: John Hurst RN, BSN Entered By: John [name] on 02/15/2022 09:41:19 -------------------------------------------------------------------------------- Problem List Details Patient Name: Date of Service: John Spell L. 02/15/2022 9:00 A M Medical Record Number: [id number] Patient Account Number: [id number] Date of Birth/Sex: Treating RN: 1950-09-11 (71 y.o. Jonette Eva,  [place] Primary Care Provider: Claretta [name] Other Clinician: Referring Provider: Treating Provider/Extender: John [name] in Treatment: 2 Active Problems ICD-10 Encounter Code Description Active Date MDM Diagnosis H70.26 Chronic diastolic (congestive) heart failure 01/31/2022 No Yes I13.0 Hypertensive heart and chronic kidney disease with heart failure and stage 1 01/31/2022 No Yes through stage 4  chronic kidney disease, or unspecified chronic kidney disease N18.30 Chronic kidney disease, stage 3 unspecified 01/31/2022 No Yes E11.9 Type 2 diabetes mellitus without complications 3/78/5885 No Yes I89.0 Lymphedema, not elsewhere classified 01/31/2022 No Yes L97.822 Non-pressure chronic ulcer of other part of left lower leg with fat layer exposed5/24/2023 No Yes L97.821 Non-pressure chronic ulcer of other part of left lower leg limited to breakdown 01/31/2022 No Yes of skin L97.812 Non-pressure chronic ulcer of other part of right lower leg with fat layer 01/31/2022 No Yes exposed L97.811 Non-pressure chronic ulcer of other part of right lower leg limited to breakdown 01/31/2022 No Yes of skin Inactive Problems Resolved Problems Electronic Signature(s) Signed: 02/15/2022 9:26:35 AM By: John Maudlin MD FACS Entered By: John [name] on 02/15/2022 09:26:35 -------------------------------------------------------------------------------- Progress Note Details Patient Name: Date of Service: John Spell L. 02/15/2022 9:00 A M Medical Record Number: [id number] Patient Account Number: [id number] Date of Birth/Sex: Treating RN: [birth year] (70 y.o. John [name] Primary Care Provider: Claretta [name] Other Clinician: Referring Provider: Treating Provider/Extender: John [name] in Treatment: 2 Subjective Chief Complaint Information obtained from Patient Patient presents for treatment of multiple open ulcers due to venous insufficiency and lymphedema History of Present Illness (HPI) ADMISSION 01/31/2022 This is a 71 year old man with severe systolic heart failure (ejection fraction roughly 20%) stage III chronic kidney disease, chronic diastolic dysfunction type 2 diabetes mellitus (last hemoglobin A1c 7.0% on November 17, 2021), lymphedema, hypertension, and chronic atrial fibrillation. He has had massive fluid shifts over the past 1 to 2 weeks. He is on  torsemide and does have a protocol for increasing his dose if his weight fluctuates a certain number of pounds. Despite this, his legs have swollen tremendously and he has had multiple blisters open and breakdown bilaterally. He is here today for further evaluation of these wounds. On his right leg, he has nearly circumferential wounds. Some of these are limited to skin breakdown, while others have the fat layer exposed. There is slough accumulation in the deeper wounds. He has similar wounds, albeit less extensive, on the left. He says that his legs feel like they are burning. 02/08/2022: Over the past week, the patient has lost 13 pounds of fluid. His wife has been changing his dressings. She reports that they never received any of the calcium alginate that was ordered. She has just been using ABD pads and Kerlix. In spite of this, however, all of his wounds are a little bit smaller and they have clean surfaces. There is just a little bit of eschar and slough accumulation on a couple of the open areas. He continues to complain of intense burning and tingling in his legs and feet. 02/15/2022: All of the wounds are smaller today. His wife did find the calcium alginate in her wound care supplies. There is still slough accumulation on all of the wound surfaces. There is a little bit of eschar adherent to a couple of the sites. He continues to complain of intense burning and tingling in his legs and feet; he has an appointment with his primary care provider next week to address his peripheral neuropathy. Patient History  Information obtained from Patient. Family History Unknown History. Social History Former smoker, Marital Status - Married, Alcohol Use - Never, Drug Use - No History, Caffeine Use - Moderate. Medical History Eyes Patient has history of Glaucoma Hematologic/Lymphatic Patient has history of Anemia Cardiovascular Patient has history of Arrhythmia - A-Fib, Congestive Heart Failure,  Coronary Artery Disease, Hypertension, Peripheral Venous Disease Endocrine Patient has history of Type II Diabetes Musculoskeletal Patient has history of Osteoarthritis Medical A Surgical History Notes nd Cardiovascular Rheumatic heart disease, Mitral Valve Replacement Objective Constitutional Within normal range for this patient. No acute distress.. Vitals Time Taken: 8:46 AM, Height: 71 in, Weight: 180 lbs, BMI: 25.1, Pulse: 96 bpm, Respiratory Rate: 22 breaths/min, Blood Pressure: 91/63 mmHg. Respiratory Normal work of breathing on room air.. General Notes: 02/15/2022: All of the wounds are smaller today. There is still slough accumulation on all of the wound surfaces. There is a little bit of eschar adherent to a couple of the sites. Integumentary (Hair, Skin) Wound #1 status is Open. Original cause of wound was Blister. The date acquired was: 01/24/2022. The wound has been in treatment 2 weeks. The wound is located on the Left,Anterior Lower Leg. The wound measures 5.6cm length x 8.5cm width x 0.1cm depth; 37.385cm^2 area and 3.738cm^3 volume. There is Fat Layer (Subcutaneous Tissue) exposed. There is no tunneling or undermining noted. There is a medium amount of serosanguineous drainage noted. The wound margin is flat and intact. There is large (67-100%) red, pink granulation within the wound bed. There is no necrotic tissue within the wound bed. Wound #2 status is Open. Original cause of wound was Blister. The date acquired was: 01/24/2022. The wound has been in treatment 2 weeks. The wound is located on the Left,Medial Lower Leg. The wound measures 2.4cm length x 4cm width x 0.1cm depth; 7.54cm^2 area and 0.754cm^3 volume. There is Fat Layer (Subcutaneous Tissue) exposed. There is no tunneling or undermining noted. There is a large amount of serous drainage noted. The wound margin is flat and intact. There is large (67-100%) pink granulation within the wound bed. There is a small (1-33%)  amount of necrotic tissue within the wound bed including Adherent Slough. Wound #3 status is Open. Original cause of wound was Blister. The date acquired was: 01/24/2022. The wound has been in treatment 2 weeks. The wound is located on the Left,Proximal,Posterior Lower Leg. The wound measures 0.5cm length x 0.4cm width x 0.1cm depth; 0.157cm^2 area and 0.016cm^3 volume. There is Fat Layer (Subcutaneous Tissue) exposed. There is no tunneling noted. There is a medium amount of serosanguineous drainage noted. The wound margin is flat and intact. There is large (67-100%) red, pink granulation within the wound bed. There is a small (1-33%) amount of necrotic tissue within the wound bed including Adherent Slough. Wound #5 status is Open. Original cause of wound was Blister. The date acquired was: 01/24/2022. The wound has been in treatment 2 weeks. The wound is located on the Right,Circumferential Lower Leg. The wound measures 12cm length x 17.5cm width x 0.1cm depth; 164.934cm^2 area and 16.493cm^3 volume. There is Fat Layer (Subcutaneous Tissue) exposed. There is no tunneling or undermining noted. There is a large amount of serous drainage noted. The wound margin is flat and intact. There is medium (34-66%) red, pink granulation within the wound bed. There is a medium (34-66%) amount of necrotic tissue within the wound bed including [place]. 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 There was a Excisional Skin/Subcutaneous Tissue Debridement with a total area of 47.6 sq cm performed by John Maudlin, MD. With the following instrument(s): Curette to remove Non-Viable tissue/material. Material removed includes Subcutaneous Tissue, Slough, and Biofilm. No specimens were taken. A time out was conducted at 09:17, prior to the start of the procedure. A Minimum amount of bleeding was controlled with Pressure. The procedure was tolerated well with a pain level of 4 throughout and a pain level of 2 following the procedure. Post Debridement Measurements: 5.6cm length x 8.5cm width x 0.1cm depth; 3.738cm^3 volume. Character of Wound/Ulcer Post Debridement is improved. Severity of Tissue Post Debridement is: Fat layer exposed. Post procedure Diagnosis Wound #1: Same as Pre-Procedure Wound #2 Pre-procedure diagnosis of Wound #2 is a Venous Leg Ulcer located on the Left,Medial Lower Leg .Severity of Tissue Pre Debridement is: Fat layer exposed. There was a Excisional Skin/Subcutaneous Tissue Debridement with a total area of 9.6 sq cm performed by John Maudlin, MD. With the following instrument(s): Curette to remove Non-Viable tissue/material. Material removed includes Subcutaneous Tissue, Slough, and Biofilm. No specimens were taken. A time out was conducted at 09:17, prior to the start of the procedure. A Minimum amount of bleeding was controlled with Pressure. The procedure was tolerated well with a pain level of 4 throughout and a pain level of 2 following the procedure. Post Debridement Measurements: 2.4cm length x 4cm width x 0.1cm depth; 0.754cm^3 volume. Character  of Wound/Ulcer Post Debridement is improved. Severity of Tissue Post Debridement is: Fat layer exposed. Post procedure Diagnosis Wound #2: Same as Pre-Procedure Wound #5 Pre-procedure diagnosis of Wound #5 is a Venous Leg Ulcer located on the Right,Circumferential Lower Leg .Severity of Tissue Pre Debridement is: Fat layer exposed. There was a Excisional Skin/Subcutaneous Tissue Debridement with a total area of 40 sq cm performed by John Maudlin, MD. With the following instrument(s): Curette to remove Non-Viable tissue/material. Material removed includes Eschar, Subcutaneous Tissue, Slough, and Biofilm. No specimens were taken. A time out was conducted at 09:17, prior to the start of the procedure. A Minimum amount of bleeding was controlled with Pressure. The procedure was tolerated well with a pain level of 4 throughout and a pain level of 2 following the procedure. Post Debridement Measurements: 12cm length x 17.5cm width x 0.1cm depth; 16.493cm^3 volume. Character of Wound/Ulcer Post Debridement is improved. Severity of Tissue Post Debridement is: Fat layer exposed. Post procedure Diagnosis Wound #5: Same as Pre-Procedure Plan Follow-up Appointments: Return Appointment in 1 week. - Dr. Celine Ahr - Room 4 - Thursday 6/15 at 10:30 Bathing/ Shower/ Hygiene: May shower with protection but do not get wound dressing(s) wet. - Ok to use Biomedical engineer, can buy at [facility], [facility], or [employer] Edema Control - Lymphedema / SCD / Other: Elevate legs to the  level of the heart or above for 30 minutes daily and/or when sitting, a frequency of: - throughout the day Avoid standing for long periods of time. Exercise regularly WOUND #1: - Lower Leg Wound Laterality: Left, Anterior Cleanser: Soap and Water Every Other Day/30 Days Discharge Instructions: May shower and wash wound with dial antibacterial soap and water prior to dressing change. Cleanser: Wound Cleanser Every Other Day/30 Days Discharge  Instructions: Cleanse the wound with wound cleanser prior to applying a clean dressing using gauze sponges, not tissue or cotton balls. Prim Dressing: Maxorb Extra Calcium Alginate Dressing, 4x4 in Every Other Day/30 Days ary Discharge Instructions: Apply calcium alginate to wound bed as instructed Secondary Dressing: ABD Pad, 8x10 (DME) (Generic) Every Other Day/30 Days Discharge Instructions: Apply over primary dressing as directed. Secondary Dressing: Woven Gauze Sponge, Non-Sterile 4x4 in Every Other Day/30 Days Discharge Instructions: Apply over primary dressing as directed. Secured With: Elastic Bandage 4 inch (ACE bandage) (DME) (Generic) Every Other Day/30 Days Discharge Instructions: Secure with ACE bandage as directed. Secured With: [employer], 4.5x3.1 (in/yd) Every Other Day/30 Days Discharge Instructions: Secure with Kerlix as directed. Secured With: Paper T ape, 2x10 (in/yd) Every Other Day/30 Days Discharge Instructions: Secure dressing with tape as directed. Secured With: [employer] Size 5, 10 (yds) (DME) (Generic) Every Other Day/30 Days WOUND #2: - Lower Leg Wound Laterality: Left, Medial Cleanser: Soap and Water Every Other Day/30 Days Discharge Instructions: May shower and wash wound with dial antibacterial soap and water prior to dressing change. Cleanser: Wound Cleanser Every Other Day/30 Days Discharge Instructions: Cleanse the wound with wound cleanser prior to applying a clean dressing using gauze sponges, not tissue or cotton balls. Prim Dressing: Maxorb Extra Calcium Alginate Dressing, 4x4 in Every Other Day/30 Days ary Discharge Instructions: Apply calcium alginate to wound bed as instructed Secondary Dressing: ABD Pad, 5x9 Every Other Day/30 Days Discharge Instructions: Apply over primary dressing as directed. Secondary Dressing: Woven Gauze Sponge, Non-Sterile 4x4 in Every Other Day/30 Days Discharge Instructions: Apply over primary dressing as  directed. Secured With: Elastic Bandage 4 inch (ACE bandage) (DME) (Generic) Every Other Day/30 Days Discharge Instructions: Secure with ACE bandage as directed. Secured With: [employer], 4.5x3.1 (in/yd) Every Other Day/30 Days Discharge Instructions: Secure with Kerlix as directed. Secured With: Paper T ape, 2x10 (in/yd) Every Other Day/30 Days Discharge Instructions: Secure dressing with tape as directed. WOUND #3: - Lower Leg Wound Laterality: Left, Posterior, Proximal Cleanser: Soap and Water Every Other Day/30 Days Discharge Instructions: May shower and wash wound with dial antibacterial soap and water prior to dressing change. Cleanser: Wound Cleanser Every Other Day/30 Days Discharge Instructions: Cleanse the wound with wound cleanser prior to applying a clean dressing using gauze sponges, not tissue or cotton balls. Prim Dressing: Maxorb Extra Calcium Alginate Dressing, 4x4 in Every Other Day/30 Days ary Discharge Instructions: Apply calcium alginate to wound bed as instructed Secondary Dressing: ABD Pad, 5x9 Every Other Day/30 Days Discharge Instructions: Apply over primary dressing as directed. Secondary Dressing: Woven Gauze Sponge, Non-Sterile 4x4 in Every Other Day/30 Days Discharge Instructions: Apply over primary dressing as directed. Secured With: Elastic Bandage 4 inch (ACE bandage) (DME) (Generic) Every Other Day/30 Days Discharge Instructions: Secure with ACE bandage as directed. Secured With: [employer], 4.5x3.1 (in/yd) Every Other Day/30 Days Discharge Instructions: Secure with Kerlix as directed. Secured With: Paper T ape, 2x10 (in/yd) Every Other Day/30 Days Discharge Instructions: Secure dressing with tape as directed. WOUND #5: -  Lower Leg Wound Laterality: Right, Circumferential Cleanser: Soap and Water Every Other Day/30 Days Discharge Instructions: May shower and wash wound with dial antibacterial soap and water prior to dressing  change. Cleanser: Wound Cleanser Every Other Day/30 Days Discharge Instructions: Cleanse the wound with wound cleanser prior to applying a clean dressing using gauze sponges, not tissue or cotton balls. Prim Dressing: Maxorb Extra Calcium Alginate Dressing, 4x4 in Every Other Day/30 Days ary Discharge Instructions: Apply calcium alginate to wound bed as instructed Secondary Dressing: ABD Pad, 8x10 (DME) (Generic) Every Other Day/30 Days Discharge Instructions: Apply over primary dressing as directed. Secondary Dressing: Woven Gauze Sponge, Non-Sterile 4x4 in Every Other Day/30 Days Discharge Instructions: Apply over primary dressing as directed. Secured With: Elastic Bandage 4 inch (ACE bandage) (DME) (Generic) Every Other Day/30 Days Discharge Instructions: Secure with ACE bandage as directed. Secured With: [employer], 4.5x3.1 (in/yd) Every Other Day/30 Days Discharge Instructions: Secure with Kerlix as directed. Secured With: Paper T ape, 2x10 (in/yd) Every Other Day/30 Days Discharge Instructions: Secure dressing with tape as directed. 02/15/2022: All of the wounds are smaller today. There is still slough accumulation on all of the wound surfaces. There is a little bit of eschar adherent to a couple of the sites. I used a curette to debride the slough and eschar from all of his wounds. We will continue using the calcium alginate with Kerlix and Coban compression. I think treating his neuropathy will go a long way towards managing his discomfort. Follow-up in 1 week. Electronic Signature(s) Signed: 02/15/2022 9:33:15 AM By: John Maudlin MD FACS Previous Signature: 02/15/2022 9:29:24 AM Version By: John Maudlin MD FACS Entered By: John [name] on 02/15/2022 09:33:15 -------------------------------------------------------------------------------- HxROS Details Patient Name: Date of Service: John Spell L. 02/15/2022 9:00 A M Medical Record Number: [id number] Patient  Account Number: [id number] Date of Birth/Sex: Treating RN: April 17, 1951 (71 y.o. John [name] Primary Care Provider: Claretta [name] Other Clinician: Referring Provider: Treating Provider/Extender: John [name] in Treatment: 2 Information Obtained From Patient Eyes Medical History: Positive for: Glaucoma Hematologic/Lymphatic Medical History: Positive for: Anemia Cardiovascular Medical History: Positive for: Arrhythmia - A-Fib; Congestive Heart Failure; Coronary Artery Disease; Hypertension; Peripheral Venous Disease Past Medical History Notes: Rheumatic heart disease, Mitral Valve Replacement Endocrine Medical History: Positive for: Type II Diabetes Treated with: Insulin, Oral agents Blood sugar tested every day: Yes Tested : twice a day Musculoskeletal Medical History: Positive for: Osteoarthritis HBO Extended History Items Eyes: Glaucoma Immunizations Pneumococcal Vaccine: Received Pneumococcal Vaccination: Yes Received Pneumococcal Vaccination On or After 60th Birthday: No Implantable Devices None Family and Social History Unknown History: Yes; Former smoker; Marital Status - Married; Alcohol Use: Never; Drug Use: No History; Caffeine Use: Moderate; Financial Concerns: No; Food, Clothing or Shelter Needs: No; Support System Lacking: No; Transportation Concerns: No Engineer, maintenance) Signed: 02/15/2022 12:34:21 PM By: John Maudlin MD FACS Signed: 02/15/2022 5:12:12 PM By: John Hurst RN, BSN Entered By: John [name] on 02/15/2022 09:27:52 -------------------------------------------------------------------------------- Dresser Details Patient Name: Date of Service: John [name] 02/15/2022 Medical Record Number: [id number] Patient Account Number: [id number] Date of Birth/Sex: Treating RN: [birth year] (71 y.o. John [name] Primary Care Provider: Claretta [name] Other Clinician: Referring Provider: Treating  Provider/Extender: John [name] in Treatment: 2 Diagnosis Coding ICD-10 Codes Code Description N82.95 Chronic diastolic (congestive) heart failure Hypertensive heart and chronic kidney disease with heart failure and stage 1 through stage 4 chronic kidney disease, or unspecified I13.0 chronic kidney disease  N18.30 Chronic kidney disease, stage 3 unspecified E11.9 Type 2 diabetes mellitus without complications B90.3 Lymphedema, not elsewhere classified L97.822 Non-pressure chronic ulcer of other part of left lower leg with fat layer exposed L97.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 L97.811 Non-pressure chronic ulcer of other part of right lower leg limited to breakdown of skin Facility Procedures CPT4 Code: [id number] Description: 11042 - DEB SUBQ TISSUE 20 SQ CM/< ICD-10 Diagnosis Description L97.822 Non-pressure chronic ulcer of other part of left lower leg with fat layer expos L97.821 Non-pressure chronic ulcer of other part of left lower leg limited to breakdown  L97.812 Non-pressure chronic ulcer of other part of right lower leg with fat layer expo L97.811 Non-pressure chronic ulcer of other part of right lower leg limited to breakdow Modifier: ed of skin sed n of skin Quantity: 1 CPT4 Code: [id number] Description: 04599 - DEB SUBQ TISS EA ADDL 20CM ICD-10 Diagnosis Description L97.822 Non-pressure chronic ulcer of other part of left lower leg with fat layer expos L97.821 Non-pressure chronic ulcer of other part of left lower leg limited to breakdown  L97.812 Non-pressure chronic ulcer of other part of right lower leg with fat layer expo L97.811 Non-pressure chronic ulcer of other part of right lower leg limited to breakdow Modifier: ed of skin sed n of skin Quantity: 4 Physician Procedures : CPT4 Code Description Modifier  [id number] 99213 - WC PHYS LEVEL 3 - EST PT 25 ICD-10 Diagnosis Description L97.822 Non-pressure chronic ulcer of other part of left lower leg with fat layer exposed L97.821 Non-pressure chronic ulcer of other part of left  lower leg limited to breakdown of skin L97.811 Non-pressure chronic ulcer of other part of right lower leg limited to breakdown of skin L97.812 Non-pressure chronic ulcer of other part of right lower leg with fat layer exposed Quantity: 1 : [id number] 11042 - WC PHYS SUBQ TISS 20 SQ CM ICD-10 Diagnosis Description L97.822 Non-pressure chronic ulcer of other part of left lower leg with fat layer exposed L97.821 Non-pressure chronic ulcer of other part of left lower leg limited to breakdown of  skin L97.812 Non-pressure chronic ulcer of other part of right lower leg with fat layer exposed L97.811 Non-pressure chronic ulcer of other part of right lower leg limited to breakdown of skin Quantity: 1 : [id number] 11045 - WC PHYS SUBQ TISS EA ADDL 20 CM ICD-10 Diagnosis Description L97.822 Non-pressure chronic ulcer of other part of left lower leg with fat layer exposed L97.821 Non-pressure chronic ulcer of other part of left lower leg limited to  breakdown of skin L97.812 Non-pressure chronic ulcer of other part of right lower leg with fat layer exposed L97.811 Non-pressure chronic ulcer of other part of right lower leg limited to breakdown of skin Quantity: 4 Electronic Signature(s) Signed: 02/15/2022 9:33:53 AM By: John Maudlin MD FACS Entered By: John [name] on 02/15/2022 09:33:53

## 2022-02-16 ENCOUNTER — Other Ambulatory Visit: Payer: Self-pay | Admitting: Cardiology

## 2022-02-19 ENCOUNTER — Ambulatory Visit (HOSPITAL_COMMUNITY)
Admission: RE | Admit: 2022-02-19 | Discharge: 2022-02-19 | Disposition: A | Discharge status: Home / Self Care | Payer: PPO | Source: Ambulatory Visit | Attending: Internal Medicine | Admitting: Internal Medicine

## 2022-02-19 DIAGNOSIS — D649 Anemia, unspecified: Secondary | ICD-10-CM | POA: Diagnosis not present

## 2022-02-19 DIAGNOSIS — Z452 Encounter for adjustment and management of vascular access device: Secondary | ICD-10-CM | POA: Diagnosis not present

## 2022-02-19 DIAGNOSIS — I255 Ischemic cardiomyopathy: Secondary | ICD-10-CM | POA: Diagnosis not present

## 2022-02-19 DIAGNOSIS — R451 Restlessness and agitation: Secondary | ICD-10-CM | POA: Diagnosis not present

## 2022-02-19 DIAGNOSIS — I5022 Chronic systolic (congestive) heart failure: Secondary | ICD-10-CM

## 2022-02-19 DIAGNOSIS — I4821 Permanent atrial fibrillation: Secondary | ICD-10-CM | POA: Diagnosis present

## 2022-02-19 DIAGNOSIS — I099 Rheumatic heart disease, unspecified: Secondary | ICD-10-CM | POA: Diagnosis not present

## 2022-02-19 DIAGNOSIS — J90 Pleural effusion, not elsewhere classified: Secondary | ICD-10-CM | POA: Diagnosis not present

## 2022-02-19 DIAGNOSIS — Z9889 Other specified postprocedural states: Secondary | ICD-10-CM | POA: Diagnosis not present

## 2022-02-19 DIAGNOSIS — I6389 Other cerebral infarction: Secondary | ICD-10-CM | POA: Diagnosis present

## 2022-02-19 DIAGNOSIS — R0609 Other forms of dyspnea: Secondary | ICD-10-CM | POA: Diagnosis not present

## 2022-02-19 DIAGNOSIS — L97812 Non-pressure chronic ulcer of other part of right lower leg with fat layer exposed: Secondary | ICD-10-CM | POA: Diagnosis not present

## 2022-02-19 DIAGNOSIS — K219 Gastro-esophageal reflux disease without esophagitis: Secondary | ICD-10-CM | POA: Diagnosis present

## 2022-02-19 DIAGNOSIS — I130 Hypertensive heart and chronic kidney disease with heart failure and stage 1 through stage 4 chronic kidney disease, or unspecified chronic kidney disease: Secondary | ICD-10-CM | POA: Diagnosis present

## 2022-02-19 DIAGNOSIS — N183 Chronic kidney disease, stage 3 unspecified: Secondary | ICD-10-CM | POA: Diagnosis not present

## 2022-02-19 DIAGNOSIS — L03116 Cellulitis of left lower limb: Secondary | ICD-10-CM | POA: Diagnosis present

## 2022-02-19 DIAGNOSIS — I5020 Unspecified systolic (congestive) heart failure: Secondary | ICD-10-CM | POA: Diagnosis not present

## 2022-02-19 DIAGNOSIS — I5023 Acute on chronic systolic (congestive) heart failure: Secondary | ICD-10-CM | POA: Diagnosis not present

## 2022-02-19 DIAGNOSIS — Z20822 Contact with and (suspected) exposure to covid-19: Secondary | ICD-10-CM | POA: Diagnosis present

## 2022-02-19 DIAGNOSIS — R6889 Other general symptoms and signs: Secondary | ICD-10-CM | POA: Diagnosis not present

## 2022-02-19 DIAGNOSIS — L97822 Non-pressure chronic ulcer of other part of left lower leg with fat layer exposed: Secondary | ICD-10-CM | POA: Diagnosis not present

## 2022-02-19 DIAGNOSIS — I4892 Unspecified atrial flutter: Secondary | ICD-10-CM | POA: Diagnosis present

## 2022-02-19 DIAGNOSIS — R638 Other symptoms and signs concerning food and fluid intake: Secondary | ICD-10-CM | POA: Diagnosis not present

## 2022-02-19 DIAGNOSIS — Z789 Other specified health status: Secondary | ICD-10-CM | POA: Diagnosis not present

## 2022-02-19 DIAGNOSIS — N179 Acute kidney failure, unspecified: Secondary | ICD-10-CM | POA: Diagnosis present

## 2022-02-19 DIAGNOSIS — E860 Dehydration: Secondary | ICD-10-CM | POA: Diagnosis present

## 2022-02-19 DIAGNOSIS — Z515 Encounter for palliative care: Secondary | ICD-10-CM | POA: Diagnosis not present

## 2022-02-19 DIAGNOSIS — I959 Hypotension, unspecified: Secondary | ICD-10-CM | POA: Diagnosis not present

## 2022-02-19 DIAGNOSIS — I359 Nonrheumatic aortic valve disorder, unspecified: Secondary | ICD-10-CM | POA: Diagnosis not present

## 2022-02-19 DIAGNOSIS — I519 Heart disease, unspecified: Secondary | ICD-10-CM | POA: Diagnosis not present

## 2022-02-19 DIAGNOSIS — I517 Cardiomegaly: Secondary | ICD-10-CM | POA: Diagnosis not present

## 2022-02-19 DIAGNOSIS — J811 Chronic pulmonary edema: Secondary | ICD-10-CM | POA: Diagnosis not present

## 2022-02-19 DIAGNOSIS — N250 Renal osteodystrophy: Secondary | ICD-10-CM | POA: Diagnosis not present

## 2022-02-19 DIAGNOSIS — E876 Hypokalemia: Secondary | ICD-10-CM | POA: Diagnosis present

## 2022-02-19 DIAGNOSIS — Z952 Presence of prosthetic heart valve: Secondary | ICD-10-CM | POA: Diagnosis not present

## 2022-02-19 DIAGNOSIS — E7800 Pure hypercholesterolemia, unspecified: Secondary | ICD-10-CM | POA: Diagnosis not present

## 2022-02-19 DIAGNOSIS — R4182 Altered mental status, unspecified: Secondary | ICD-10-CM | POA: Diagnosis not present

## 2022-02-19 DIAGNOSIS — R570 Cardiogenic shock: Secondary | ICD-10-CM | POA: Diagnosis present

## 2022-02-19 DIAGNOSIS — E785 Hyperlipidemia, unspecified: Secondary | ICD-10-CM | POA: Diagnosis present

## 2022-02-19 DIAGNOSIS — I890 Lymphedema, not elsewhere classified: Secondary | ICD-10-CM | POA: Diagnosis present

## 2022-02-19 DIAGNOSIS — I878 Other specified disorders of veins: Secondary | ICD-10-CM | POA: Diagnosis present

## 2022-02-19 DIAGNOSIS — I071 Rheumatic tricuspid insufficiency: Secondary | ICD-10-CM | POA: Diagnosis present

## 2022-02-19 DIAGNOSIS — E1142 Type 2 diabetes mellitus with diabetic polyneuropathy: Secondary | ICD-10-CM | POA: Diagnosis present

## 2022-02-19 DIAGNOSIS — N189 Chronic kidney disease, unspecified: Secondary | ICD-10-CM | POA: Diagnosis not present

## 2022-02-19 DIAGNOSIS — G9341 Metabolic encephalopathy: Secondary | ICD-10-CM | POA: Diagnosis present

## 2022-02-19 DIAGNOSIS — I482 Chronic atrial fibrillation, unspecified: Secondary | ICD-10-CM | POA: Diagnosis not present

## 2022-02-19 DIAGNOSIS — N1832 Chronic kidney disease, stage 3b: Secondary | ICD-10-CM | POA: Diagnosis present

## 2022-02-19 DIAGNOSIS — E1169 Type 2 diabetes mellitus with other specified complication: Secondary | ICD-10-CM | POA: Diagnosis present

## 2022-02-19 DIAGNOSIS — E1122 Type 2 diabetes mellitus with diabetic chronic kidney disease: Secondary | ICD-10-CM | POA: Diagnosis present

## 2022-02-19 DIAGNOSIS — R531 Weakness: Secondary | ICD-10-CM | POA: Diagnosis not present

## 2022-02-19 DIAGNOSIS — Z95810 Presence of automatic (implantable) cardiac defibrillator: Secondary | ICD-10-CM | POA: Diagnosis not present

## 2022-02-19 DIAGNOSIS — I10 Essential (primary) hypertension: Secondary | ICD-10-CM | POA: Diagnosis not present

## 2022-02-19 DIAGNOSIS — I4891 Unspecified atrial fibrillation: Secondary | ICD-10-CM | POA: Diagnosis not present

## 2022-02-19 DIAGNOSIS — I5043 Acute on chronic combined systolic (congestive) and diastolic (congestive) heart failure: Secondary | ICD-10-CM | POA: Diagnosis present

## 2022-02-19 DIAGNOSIS — I2510 Atherosclerotic heart disease of native coronary artery without angina pectoris: Secondary | ICD-10-CM | POA: Diagnosis not present

## 2022-02-19 DIAGNOSIS — I872 Venous insufficiency (chronic) (peripheral): Secondary | ICD-10-CM | POA: Diagnosis not present

## 2022-02-19 DIAGNOSIS — L03115 Cellulitis of right lower limb: Secondary | ICD-10-CM | POA: Diagnosis present

## 2022-02-19 DIAGNOSIS — Z66 Do not resuscitate: Secondary | ICD-10-CM | POA: Diagnosis not present

## 2022-02-19 DIAGNOSIS — E1121 Type 2 diabetes mellitus with diabetic nephropathy: Secondary | ICD-10-CM | POA: Diagnosis not present

## 2022-02-19 DIAGNOSIS — Z7189 Other specified counseling: Secondary | ICD-10-CM | POA: Diagnosis not present

## 2022-02-19 DIAGNOSIS — R410 Disorientation, unspecified: Secondary | ICD-10-CM | POA: Diagnosis not present

## 2022-02-19 LAB — BASIC METABOLIC PANEL
Anion gap: 17 — ABNORMAL HIGH (ref 5–15)
BUN: 134 mg/dL — ABNORMAL HIGH (ref 8–23)
CO2: 24 mmol/L (ref 22–32)
Calcium: 9.1 mg/dL (ref 8.9–10.3)
Chloride: 94 mmol/L — ABNORMAL LOW (ref 98–111)
Creatinine, Ser: 3.37 mg/dL — ABNORMAL HIGH (ref 0.61–1.24)
GFR, Estimated: 19 mL/min — ABNORMAL LOW (ref >60)
Glucose, Bld: 208 mg/dL — ABNORMAL HIGH (ref 70–99)
Potassium: 3.7 mmol/L (ref 3.5–5.1)
Sodium: 135 mmol/L (ref 135–145)

## 2022-02-20 ENCOUNTER — Telehealth: Payer: Self-pay | Admitting: Cardiology

## 2022-02-20 NOTE — Telephone Encounter (Signed)
Pt c/o BP issue: STAT if pt c/o blurred vision, one-sided weakness or slurred speech  1. What are your last 5 BP readings? Last week 91/60  2. Are you having any other symptoms (ex. Dizziness, headache, blurred vision, passed out)? confusion  3. What is your BP issue? Patient's wife calling to discuss the patient having a lot of confusion. She says his BP has also been low. She would like Dr. Percival Spanish to call her back. She says to call her work number until 5pm and then she can answer her cell phone.

## 2022-02-20 NOTE — Telephone Encounter (Signed)
Minus Breeding, MD  You 15 minutes ago (12:10 PM)    Let us just try to stop the metoprolol.      Called pt's wife to let her know of Dr. Rosezella [place] recommendations. Medication removed from pt's medication list and advised wife to keep appointment to see Raquel Sarna on 6/15. Wife verbalizes understanding.

## 2022-02-20 NOTE — Telephone Encounter (Signed)
Called wife, Marcie Bal (ok per DPR) back to discuss issues with her husband's increased confusion over the last couple of weeks. Wife states that pt does not think that he is confused, he says that everyone else is confused, not him. Wife states that at pt's last office visit with Dr. Percival Spanish they were told that if pt's blood pressure was low that they could reduce the metoprolol to 12.'5mg'$  daily, wife was also recently told by the heart failure clinic to reduce his torsemide to '40mg'$  twice daily because per his device he seemed dry. Wife states the blood pressures that were obtained at his recent wound clinic visits were 86/56 and 91/66. Wife states that when she takes his blood pressure at home she usually get in the range 98-110/60-70s. Wife states that pt's does not have signs of edema. Wife does feel like he is drinking enough to stay hydrated but says that she doesn't believe that he eats enough throughout the day. Wife states that he is on a low sodium diet. Pt is followed closely by the HF clinic. Wife would like to know if lower blood pressure could be the cause of this confusion. Will route to provider to advise.   Pt is scheduled to see Diona Browner, NP on 02/22/22 at 8:25am.

## 2022-02-21 NOTE — Progress Notes (Signed)
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 Jude ICD, aortic valve stenosis, hypertension, hyperlipidemia, CKD stage IIIb, rheumatic fever, type 2 diabetes, and GERD who presents for follow-up related to heart failure and hypotension.   Past Medical History    Past Medical History:  Diagnosis Date   Abnormal nuclear stress test 05/10/2017   Allergy    Anemia    "lost blood w/the cancer"   Arthritis    back    Atrial fibrillation (HCC)    takes Coumadin daily   Bruises easily    takes Coumadin daily   CAD (coronary artery disease)    a. s/p CABG 11/2000 (L-LAD, S-RCA);  b. Lexiscan Myoview 11/12: EF 45%, ischemia and possibly some scar at the base of the inferolateral wall;   c. 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 s/p mechanical (St. Jude) MVR 2002;  b. Echo 5/11: Mild LVH, EF 45-50%, mild AS, mild AI, mean gradient 11 mmHg, MVR with normal  gradients, moderate LAE, mild RAE;  c.  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   SOB (shortness of breath)    with exertion   Type II diabetes mellitus ([place])    takes Januvia,Glipizide,and Metformin daily as well as Lantus   Unstable angina ([place]) 06/21/2020   Past Surgical History:  Procedure Laterality Date   APPLICATION OF WOUND VAC  2014   CARDIAC CATHETERIZATION  2002   COLONOSCOPY     CORONARY ARTERY BYPASS GRAFT  2002   x 3   CORONARY ARTERY BYPASS GRAFT     CORONARY ATHERECTOMY N/A 06/24/2020   Procedure: CORONARY ATHERECTOMY;  Surgeon: [name], MD;  Location: [facility];  Service: Cardiovascular;  Laterality: N/A;   CORONARY BALLOON ANGIOPLASTY N/A 06/24/2020   Procedure: CORONARY BALLOON ANGIOPLASTY;  Surgeon: [name], MD;  Location: [facility];  Service: Cardiovascular;  Laterality: N/A;   CORONARY STENT INTERVENTION N/A 06/24/2020   Procedure: CORONARY STENT INTERVENTION;  Surgeon: [name], MD;  Location: [facility];  Service: Cardiovascular;  Laterality:  N/A;   ESOPHAGOGASTRODUODENOSCOPY     HERNIA REPAIR     ICD IMPLANT N/A 02/19/2020   Procedure: ICD IMPLANT;  Surgeon: Evans Lance, MD;  Location: [facility];  Service: Cardiovascular;  Laterality: N/A;   INCISIONAL HERNIA REPAIR N/A 03/15/2014   Procedure: LAPAROSCOPIC INCISIONAL HERNIA;  Surgeon: Harl Bowie, MD;  Location: [facility];  Service: General;  Laterality: N/A;   INSERTION OF MESH N/A 03/15/2014   Procedure: INSERTION OF MESH;  Surgeon: Harl Bowie, MD;  Location: [facility];  Service: General;  Laterality: N/A;   LAPAROSCOPIC INCISIONAL / UMBILICAL / [place]  03/15/2014   IHR   LAPAROTOMY N/A 02/09/2013   Procedure: EXPLORATORY LAPAROTOMY WITH incisional biopsy intra- ABDOMINAL MASS ILOCECTOMY ;  Surgeon:  Harl Bowie, MD;  Location: [facility];  Service: General;  Laterality: N/A;   MITRAL VALVE REPLACEMENT  11/2000   #33 St. Jude mechanical mitral valve prosthesis. Dr. Servando Snare   [facility] REMOVAL  03/15/2014   PORT-A-CATH REMOVAL Left 03/15/2014   Procedure: REMOVAL PORT-A-CATH;  Surgeon: Harl Bowie, MD;  Location: [facility];  Service: General;  Laterality: Left;   PORTACATH PLACEMENT N/A 02/09/2013   Procedure: INSERTION PORT-A-CATH;  Surgeon: Harl Bowie, MD;  Location: [facility];  Service: General;  Laterality: N/A;   RIGHT AND LEFT HEART CATH N/A 11/27/2021   Procedure: RIGHT AND LEFT HEART CATH;  Surgeon: Larey Dresser, MD;  Location: [facility];  Service: Cardiovascular;  Laterality: N/A;   RIGHT/LEFT HEART CATH AND CORONARY/GRAFT ANGIOGRAPHY N/A 05/10/2017   Procedure: RIGHT/LEFT HEART CATH AND CORONARY/GRAFT ANGIOGRAPHY;  Surgeon: Martinique, Peter M, MD;  Location: [facility];  Service: Cardiovascular;  Laterality: N/A;   RIGHT/LEFT HEART CATH AND CORONARY/GRAFT ANGIOGRAPHY N/A 06/23/2020   Procedure: RIGHT/LEFT HEART CATH AND CORONARY/GRAFT ANGIOGRAPHY;  Surgeon: Troy Sine, MD;  Location: [facility];  Service: Cardiovascular;  Laterality: N/A;   SEPTOPLASTY     SMALL INTESTINE SURGERY     TEE WITHOUT CARDIOVERSION N/A 11/21/2021   Procedure: TRANSESOPHAGEAL ECHOCARDIOGRAM (TEE);  Surgeon: Larey Dresser, MD;  Location: [facility];  Service: Cardiovascular;  Laterality: N/A;    Allergies  Allergies  Allergen Reactions   Allopurinol Shortness Of Breath   Doxycycline Hives   Farxiga [Dapagliflozin]     Can not tolerate - dizzy weak almost passed out    History of Present Illness    71 year old male with the above past medical history including CAD s/p CABG x2 in 2002, mechanical MVR in 2002 on coumadin, chronic atrial fibrillation, chronic systolic heart failure, St. Jude ICD, aortic valve stenosis, hypertension, hyperlipidemia, CKD stage  IIIb, rheumatic fever, type 2 diabetes, and GERD.  History of CAD s/p CABG x2 in 2002 (LIMA-LAD, SVG-RCA).  Additionally, he has a history of rheumatic heart disease s/p mechanical MVR in 2002 on coumadin. Lexiscan Myoview in 2018 showed new anterior wall perfusion defect.  Cardiac catheterization at the time showed occluded SVG to the RCA but flow through the native vessel.  The LAD was occluded but with good flow through the LIMA.  Echocardiogram in April 2021 showed EF 25 to 30%.  He was referred to EP and underwent ICD implantation in June 2021. Repeat cardiac catheterization during hospitalization in October 2021 for unstable angina revealed 85% ostial left circumflex lesion, 100% mid LAD occlusion, 30% proximal RCA lesion, 80% proximal to distal RCA lesion, 100% occlusion in SVG to distal RCA, patent  LIMA to LAD, 70% proximal to mid LAD lesion. The left circumflex lesion was treated with a Resolute Onyx 2.75 x 26 mm DES on the following day with staged PCI.   He was hospitalized in March 2023 for acute CHF exacerbation.  Echocardiogram at the time showed EF 20 to 25%, severe RV hypokinesis, mechanical MV with mean gradient of 6, moderate to severe TR, moderate AI, questionable low-flow/low gradient moderate to severe AAS with mean gradient 15. TEE showed EF around 25% but only mildly decreased RV function trivial TR, normally functioning mechanical MV, and abnormal aortic valve with moderate to severe AAS and AI.  R/LHC showed elevated filling pressures, moderate mixed pulmonary/arterial/pulmonary venous hypertension, preserved cardiac output and mild aortic stenosis. Case was discussed with structural heart team and he was not felt to be a good TAVR candidate.  He was diuresed with IV Lasix, augmented with milrinone, which was subsequently weaned.    He follows with the advanced heart failure clinic. GDMT has been limited by CKD. At his posthospital follow-up in May 2023 he was markedly volume  overloaded with NYHA class IIIb symptoms. He received 3 days of Furoscix as well as antibiotics for suspected lower extremity cellulitis.  He was last seen in the heart failure clinic on 01/31/2022 and was markedly volume overloaded. He reported overall fatigue, generalized weakness. His wife contacted our office on 02/20/2022 with concern for low BP, increased confusion for the past couple of weeks.  His metoprolol was discontinued and he was advised to follow-up in clinic.   He presents today for follow-up accompanied by his wife.  Since his last visit since his wife contacted our office his BP has returned to baseline, however, he continues to experience confusion, generalized fatigue, limited mobility, decreased appetite, and dyspnea on exertion, and ongoing orthopnea. Patient is a poor historian, but he denies worsening edema. He states his shortness of breath is unchanged since his most recent visit with the heart failure clinic. He denies any symptoms concerning for UTI.  He is following with the wound care clinic for acute cellulitis of bilateral lower extremities.  His wife is very concerned that patient has not been himself over the past 3 weeks and is not improving.   Home Medications    Current Outpatient Medications  Medication Sig Dispense Refill   clopidogrel (PLAVIX) 75 MG tablet TAKE 1 TABLET BY MOUTH DAILY WITH BREAKFAST. 90 tablet 3   FUROSCIX 80 MG/10ML CTKT Inject into the skin.     glipiZIDE (GLUCOTROL XL) 10 MG 24 hr tablet Take 1 tablet (10 mg total) by mouth daily. 90 tablet 3   insulin glargine (LANTUS) 100 UNIT/ML injection Inject 0.7 mLs (70 Units total) into the skin at bedtime. (Patient taking differently: Inject 70 Units into the skin. Take 70 units if over 150 daily) 10 mL 11   JANUVIA 50 MG tablet Take 0.5 tablets (25 mg total) by mouth daily. 90 tablet 2   nitroGLYCERIN (NITROSTAT) 0.4 MG SL tablet Place 1 tablet (0.4 mg total) under the tongue every 5 (five) minutes as  needed for chest pain. 50 tablet 10   OneTouch Delica Lancets 34L MISC TEST 2 TIMES DAILY AS DIRECTED 100 each 5   ONETOUCH VERIO test strip USE TO TEST TWICE A DAY 200 strip 3   pantoprazole (PROTONIX) 40 MG tablet Take 1 tablet (40 mg total) by mouth daily. 90 tablet 3   rosuvastatin (CRESTOR) 20 MG tablet 20 mg. Patient takes 1 tablet by  mouth every other day.     sodium bicarbonate 650 MG tablet Take 650 mg by mouth 2 (two) times daily.     torsemide (DEMADEX) 20 MG tablet Take 40 mg by mouth 2 (two) times daily. If weight increases, increase to 60 mg bid     warfarin (COUMADIN) 5 MG tablet Take 1 tablet (5 mg total) by mouth daily. (Patient taking differently: Take 5 mg by mouth daily. Alternates 2.5 x2 days then '5mg'$  alternating) 90 tablet 3   No current facility-administered medications for this visit.     Review of Systems    He denies chest pain, palpitations, pnd, n, v, dizziness, syncope, edema, weight gain, or early satiety. All other systems reviewed and are otherwise negative except as noted above.   Physical Exam    VS:  BP 102/68   Ht '5\' 11"'$  (1.803 m)   Wt 186 lb 9.6 oz (84.6 kg)   BMI 26.03 kg/m   GEN: Well nourished, well developed, in no acute distress. HEENT: normal. Neck: Supple, no JVD, carotid bruits, or masses. Cardiac: IRIR, 2/6 murmur, no rubs, or gallops. No clubbing, cyanosis, edema.  Radials/DP/PT 2+ and equal bilaterally.  Respiratory:  Respirations regular and unlabored, clear to auscultation bilaterally. GI: Soft, nontender, nondistended, BS + x 4. MS: no deformity or atrophy. Skin: warm and dry, no rash. Neuro:  Strength and sensation are intact. Psych: Normal affect.  Accessory Clinical Findings    ECG personally reviewed by me today -atrial fibrillation, 100 bpm, RBBB, nonspecific ST, T wave abnormalities- no acute changes.  Lab Results  Component Value Date   WBC 6.5 01/23/2022   HGB 13.4 01/23/2022   HCT 42.3 01/23/2022   MCV 90.2  01/23/2022   PLT 188 01/23/2022   Lab Results  Component Value Date   CREATININE 3.37 (H) 02/19/2022   BUN 134 (H) 02/19/2022   NA 135 02/19/2022   K 3.7 02/19/2022   CL 94 (L) 02/19/2022   CO2 24 02/19/2022   Lab Results  Component Value Date   ALT 14 01/23/2022   AST 23 01/23/2022   ALKPHOS 215 (H) 01/23/2022   BILITOT 2.1 (H) 01/23/2022   Lab Results  Component Value Date   CHOL 111 06/09/2021   HDL 30 (L) 06/09/2021   LDLCALC 65 06/09/2021   LDLDIRECT 88 09/23/2017   TRIG 79 06/09/2021   CHOLHDL 3.7 06/09/2021    Lab Results  Component Value Date   HGBA1C 7.0 (H) 11/17/2021    Assessment & Plan    1. Acute confusion: Over the past 3 weeks, wife has noticed increased confusion.  Of note, he did have recent subtherapeutic INR of 1.4.  Warfarin was increased, he is due for repeat INR.  Pt is disoriented to time and place in office today. Neurological exam otherwise unremarkable, he does have some bilateral lower extremity weakness. He is afebrile, though he does report generalized weakness, decreased appetite, recent hypotension over the past 3 weeks in addition to his confusion. Denies any symptoms concerning for UTI.  He is following in the wound care clinic for treatment of cellulitis. Case discussed with Dr. Harl Bowie, DOD. Given new onset confusion, recommend full work-up in ED to rule out neurological, infectious, or other etiology of acute confusion. Consider cardiorenal syndrome as possible cause.  2. Hypotension/Hypertension: Metoprolol was recently discontinued.  Additionally, his torsemide was decreased to 40 mg bid. BP has since returned to baseline.  HR is slightly increased with discontinuation of metoprolol.  There was concern that low BP was contributing to his recent confusion as above, however, his symptoms have not improved with BP return to baseline.  Continue torsemide.  3. Chronic systolic heart failure: S/p St. Jude ICD. Most recent echo in 11/2021 showed EF  20 to 25%, severe RV hypokinesis, mechanical MV with mean gradient of 6, moderate to severe TR, moderate AI, questionable low-flow/low gradient moderate to severe AS with mean gradient 15. TEE showed EF around 25% but only mildly decreased RV function, trivial TR, normally functioning mechanical MV, and abnormal aortic valve with moderate to severe AS and AI. Weight is stable.  He does note ongoing dyspnea on exertion that is unchanged since his recent visit with advanced heart failure clinic.  Overall, he appears euvolemic and well compensated on exam. Lasix was recently decreased to 40 mg twice daily in the setting of hypotension.  Does not have any obvious signs of fluid volume overload, however, he could be developing  early cardiorenal syndrome.  Recommend ED evaluation as above.  Further escalation of GDMT/advanced therapies limited in the setting of hypotension, CKD.  Continue torsemide.  Recommend follow-up with advanced heart failure clinic.   4. Rheumatic/valvular heart disease: S/p mechanical MVR in 2002, on coumadin.  Most recent INR 1.4.  Most recent echo/TEE as above (TR regurgitation, moderate to severe aortic stenosis, moderate AI). Continue Coumadin, torsemide, SBE prophylaxis.   5. CAD: S/p CABG x2 in 2002.  He does note ongoing dyspnea on exertion, however, this is unchanged since his recent visit with the heart failure clinic.  Severe aortic stenosis, advanced heart failure likely contributing to dyspnea. He denies any other symptoms concerning for angina.  If work-up unremarkable, could consider further ischemic evaluation, however, I would not pursue this at this time.  For now, continue Plavix, Crestor, torsemide.  6. Chronic atrial fibrillation: HR 100 in office today.  EKG shows atrial fibrillation, RBBB.  HR slightly increased with discontinuation of metoprolol. Beta-blocker therapy is limited in the setting of hypotension.  7. Hyperlipidemia: LDL was 65 in September 2022.  Continue  Crestor.  8. CKD stage IIIb: Creatinine was 3.37 on 02/19/2022.  Appears to be increased from baseline creatinine (2.0-2.5).  Continue to monitor closely with diuresis.  9. Celluiltis: Following with wound care.  He states he has an appointment today and his wife would like for him to go to this appointment prior to his ED visit.   10. Type 2 diabetes: A1C was 7.0 in March 2023.  Monitored and managed per PCP.  11. Disposition: Advised ED evaluation today.  Follow-up as heart failure clinic in 1 month, Dr. Percival Spanish in 3-4 months.   Lenna Sciara, NP 02/22/2022, 9:41 AM

## 2022-02-22 ENCOUNTER — Encounter (HOSPITAL_BASED_OUTPATIENT_CLINIC_OR_DEPARTMENT_OTHER): Payer: PPO | Admitting: General Surgery

## 2022-02-22 ENCOUNTER — Encounter: Payer: Self-pay | Admitting: Nurse Practitioner

## 2022-02-22 ENCOUNTER — Emergency Department (HOSPITAL_BASED_OUTPATIENT_CLINIC_OR_DEPARTMENT_OTHER): Payer: PPO

## 2022-02-22 ENCOUNTER — Other Ambulatory Visit: Payer: Self-pay

## 2022-02-22 ENCOUNTER — Encounter (HOSPITAL_BASED_OUTPATIENT_CLINIC_OR_DEPARTMENT_OTHER): Payer: Self-pay | Admitting: Emergency Medicine

## 2022-02-22 ENCOUNTER — Ambulatory Visit (INDEPENDENT_AMBULATORY_CARE_PROVIDER_SITE_OTHER): Payer: HMO | Admitting: Nurse Practitioner

## 2022-02-22 ENCOUNTER — Ambulatory Visit: Payer: Self-pay | Admitting: Cardiology

## 2022-02-22 ENCOUNTER — Inpatient Hospital Stay (HOSPITAL_BASED_OUTPATIENT_CLINIC_OR_DEPARTMENT_OTHER)
Admission: EM | Admit: 2022-02-22 | Discharge: 2022-03-02 | DRG: 682 | Disposition: A | Discharge status: Hospice | Payer: PPO | Attending: Family Medicine | Admitting: Family Medicine

## 2022-02-22 VITALS — BP 102/68 | Ht 71.0 in | Wt 186.6 lb

## 2022-02-22 DIAGNOSIS — Z9889 Other specified postprocedural states: Secondary | ICD-10-CM

## 2022-02-22 DIAGNOSIS — L97812 Non-pressure chronic ulcer of other part of right lower leg with fat layer exposed: Secondary | ICD-10-CM | POA: Diagnosis not present

## 2022-02-22 DIAGNOSIS — Z79899 Other long term (current) drug therapy: Secondary | ICD-10-CM

## 2022-02-22 DIAGNOSIS — Z452 Encounter for adjustment and management of vascular access device: Secondary | ICD-10-CM | POA: Diagnosis not present

## 2022-02-22 DIAGNOSIS — E860 Dehydration: Secondary | ICD-10-CM | POA: Diagnosis present

## 2022-02-22 DIAGNOSIS — R791 Abnormal coagulation profile: Secondary | ICD-10-CM | POA: Diagnosis present

## 2022-02-22 DIAGNOSIS — I878 Other specified disorders of veins: Secondary | ICD-10-CM | POA: Diagnosis present

## 2022-02-22 DIAGNOSIS — I482 Chronic atrial fibrillation, unspecified: Secondary | ICD-10-CM | POA: Diagnosis not present

## 2022-02-22 DIAGNOSIS — N1831 Chronic kidney disease, stage 3a: Secondary | ICD-10-CM

## 2022-02-22 DIAGNOSIS — E1142 Type 2 diabetes mellitus with diabetic polyneuropathy: Secondary | ICD-10-CM | POA: Diagnosis present

## 2022-02-22 DIAGNOSIS — N400 Enlarged prostate without lower urinary tract symptoms: Secondary | ICD-10-CM | POA: Diagnosis present

## 2022-02-22 DIAGNOSIS — I2510 Atherosclerotic heart disease of native coronary artery without angina pectoris: Secondary | ICD-10-CM

## 2022-02-22 DIAGNOSIS — N179 Acute kidney failure, unspecified: Secondary | ICD-10-CM | POA: Diagnosis present

## 2022-02-22 DIAGNOSIS — Z20822 Contact with and (suspected) exposure to covid-19: Secondary | ICD-10-CM | POA: Diagnosis present

## 2022-02-22 DIAGNOSIS — I872 Venous insufficiency (chronic) (peripheral): Secondary | ICD-10-CM | POA: Diagnosis not present

## 2022-02-22 DIAGNOSIS — R0609 Other forms of dyspnea: Secondary | ICD-10-CM | POA: Diagnosis not present

## 2022-02-22 DIAGNOSIS — E1122 Type 2 diabetes mellitus with diabetic chronic kidney disease: Secondary | ICD-10-CM | POA: Diagnosis present

## 2022-02-22 DIAGNOSIS — I4821 Permanent atrial fibrillation: Secondary | ICD-10-CM | POA: Diagnosis present

## 2022-02-22 DIAGNOSIS — Z955 Presence of coronary angioplasty implant and graft: Secondary | ICD-10-CM

## 2022-02-22 DIAGNOSIS — I6389 Other cerebral infarction: Secondary | ICD-10-CM | POA: Diagnosis present

## 2022-02-22 DIAGNOSIS — I4510 Unspecified right bundle-branch block: Secondary | ICD-10-CM | POA: Diagnosis present

## 2022-02-22 DIAGNOSIS — E1121 Type 2 diabetes mellitus with diabetic nephropathy: Secondary | ICD-10-CM | POA: Diagnosis not present

## 2022-02-22 DIAGNOSIS — I359 Nonrheumatic aortic valve disorder, unspecified: Secondary | ICD-10-CM

## 2022-02-22 DIAGNOSIS — N189 Chronic kidney disease, unspecified: Secondary | ICD-10-CM | POA: Diagnosis not present

## 2022-02-22 DIAGNOSIS — I130 Hypertensive heart and chronic kidney disease with heart failure and stage 1 through stage 4 chronic kidney disease, or unspecified chronic kidney disease: Secondary | ICD-10-CM | POA: Diagnosis present

## 2022-02-22 DIAGNOSIS — Z7901 Long term (current) use of anticoagulants: Secondary | ICD-10-CM

## 2022-02-22 DIAGNOSIS — R410 Disorientation, unspecified: Principal | ICD-10-CM

## 2022-02-22 DIAGNOSIS — Z952 Presence of prosthetic heart valve: Secondary | ICD-10-CM | POA: Diagnosis not present

## 2022-02-22 DIAGNOSIS — R570 Cardiogenic shock: Secondary | ICD-10-CM | POA: Diagnosis present

## 2022-02-22 DIAGNOSIS — I890 Lymphedema, not elsewhere classified: Secondary | ICD-10-CM | POA: Diagnosis present

## 2022-02-22 DIAGNOSIS — R638 Other symptoms and signs concerning food and fluid intake: Secondary | ICD-10-CM | POA: Diagnosis not present

## 2022-02-22 DIAGNOSIS — H409 Unspecified glaucoma: Secondary | ICD-10-CM | POA: Diagnosis present

## 2022-02-22 DIAGNOSIS — Z833 Family history of diabetes mellitus: Secondary | ICD-10-CM

## 2022-02-22 DIAGNOSIS — Z794 Long term (current) use of insulin: Secondary | ICD-10-CM

## 2022-02-22 DIAGNOSIS — I517 Cardiomegaly: Secondary | ICD-10-CM | POA: Diagnosis not present

## 2022-02-22 DIAGNOSIS — E1165 Type 2 diabetes mellitus with hyperglycemia: Secondary | ICD-10-CM | POA: Diagnosis present

## 2022-02-22 DIAGNOSIS — Z951 Presence of aortocoronary bypass graft: Secondary | ICD-10-CM

## 2022-02-22 DIAGNOSIS — Z83438 Family history of other disorder of lipoprotein metabolism and other lipidemia: Secondary | ICD-10-CM

## 2022-02-22 DIAGNOSIS — L97822 Non-pressure chronic ulcer of other part of left lower leg with fat layer exposed: Secondary | ICD-10-CM | POA: Diagnosis not present

## 2022-02-22 DIAGNOSIS — Z87891 Personal history of nicotine dependence: Secondary | ICD-10-CM

## 2022-02-22 DIAGNOSIS — I4892 Unspecified atrial flutter: Secondary | ICD-10-CM | POA: Diagnosis present

## 2022-02-22 DIAGNOSIS — I959 Hypotension, unspecified: Secondary | ICD-10-CM

## 2022-02-22 DIAGNOSIS — Z7984 Long term (current) use of oral hypoglycemic drugs: Secondary | ICD-10-CM

## 2022-02-22 DIAGNOSIS — Z66 Do not resuscitate: Secondary | ICD-10-CM | POA: Diagnosis not present

## 2022-02-22 DIAGNOSIS — Z515 Encounter for palliative care: Secondary | ICD-10-CM

## 2022-02-22 DIAGNOSIS — N183 Chronic kidney disease, stage 3 unspecified: Secondary | ICD-10-CM

## 2022-02-22 DIAGNOSIS — I099 Rheumatic heart disease, unspecified: Secondary | ICD-10-CM | POA: Diagnosis present

## 2022-02-22 DIAGNOSIS — L03115 Cellulitis of right lower limb: Secondary | ICD-10-CM | POA: Diagnosis present

## 2022-02-22 DIAGNOSIS — Z95810 Presence of automatic (implantable) cardiac defibrillator: Secondary | ICD-10-CM

## 2022-02-22 DIAGNOSIS — Z789 Other specified health status: Secondary | ICD-10-CM | POA: Diagnosis not present

## 2022-02-22 DIAGNOSIS — I255 Ischemic cardiomyopathy: Secondary | ICD-10-CM | POA: Diagnosis present

## 2022-02-22 DIAGNOSIS — I5022 Chronic systolic (congestive) heart failure: Secondary | ICD-10-CM | POA: Diagnosis not present

## 2022-02-22 DIAGNOSIS — I10 Essential (primary) hypertension: Secondary | ICD-10-CM

## 2022-02-22 DIAGNOSIS — R4182 Altered mental status, unspecified: Secondary | ICD-10-CM | POA: Diagnosis not present

## 2022-02-22 DIAGNOSIS — Z7189 Other specified counseling: Secondary | ICD-10-CM | POA: Diagnosis not present

## 2022-02-22 DIAGNOSIS — L8990 Pressure ulcer of unspecified site, unspecified stage: Secondary | ICD-10-CM | POA: Insufficient documentation

## 2022-02-22 DIAGNOSIS — I5043 Acute on chronic combined systolic (congestive) and diastolic (congestive) heart failure: Secondary | ICD-10-CM | POA: Diagnosis present

## 2022-02-22 DIAGNOSIS — I5023 Acute on chronic systolic (congestive) heart failure: Secondary | ICD-10-CM | POA: Diagnosis not present

## 2022-02-22 DIAGNOSIS — D649 Anemia, unspecified: Secondary | ICD-10-CM | POA: Diagnosis not present

## 2022-02-22 DIAGNOSIS — I5020 Unspecified systolic (congestive) heart failure: Secondary | ICD-10-CM | POA: Diagnosis not present

## 2022-02-22 DIAGNOSIS — Z888 Allergy status to other drugs, medicaments and biological substances status: Secondary | ICD-10-CM

## 2022-02-22 DIAGNOSIS — N1832 Chronic kidney disease, stage 3b: Secondary | ICD-10-CM | POA: Diagnosis present

## 2022-02-22 DIAGNOSIS — I071 Rheumatic tricuspid insufficiency: Secondary | ICD-10-CM | POA: Diagnosis present

## 2022-02-22 DIAGNOSIS — Z8572 Personal history of non-Hodgkin lymphomas: Secondary | ICD-10-CM

## 2022-02-22 DIAGNOSIS — E785 Hyperlipidemia, unspecified: Secondary | ICD-10-CM | POA: Diagnosis present

## 2022-02-22 DIAGNOSIS — Z881 Allergy status to other antibiotic agents status: Secondary | ICD-10-CM

## 2022-02-22 DIAGNOSIS — I519 Heart disease, unspecified: Secondary | ICD-10-CM | POA: Diagnosis not present

## 2022-02-22 DIAGNOSIS — I25118 Atherosclerotic heart disease of native coronary artery with other forms of angina pectoris: Secondary | ICD-10-CM

## 2022-02-22 DIAGNOSIS — E1169 Type 2 diabetes mellitus with other specified complication: Secondary | ICD-10-CM | POA: Diagnosis present

## 2022-02-22 DIAGNOSIS — Z8249 Family history of ischemic heart disease and other diseases of the circulatory system: Secondary | ICD-10-CM

## 2022-02-22 DIAGNOSIS — L03116 Cellulitis of left lower limb: Secondary | ICD-10-CM | POA: Diagnosis present

## 2022-02-22 DIAGNOSIS — J90 Pleural effusion, not elsewhere classified: Secondary | ICD-10-CM | POA: Diagnosis not present

## 2022-02-22 DIAGNOSIS — Z8673 Personal history of transient ischemic attack (TIA), and cerebral infarction without residual deficits: Secondary | ICD-10-CM

## 2022-02-22 DIAGNOSIS — R54 Age-related physical debility: Secondary | ICD-10-CM | POA: Diagnosis present

## 2022-02-22 DIAGNOSIS — J811 Chronic pulmonary edema: Secondary | ICD-10-CM | POA: Diagnosis not present

## 2022-02-22 DIAGNOSIS — I350 Nonrheumatic aortic (valve) stenosis: Secondary | ICD-10-CM | POA: Diagnosis present

## 2022-02-22 DIAGNOSIS — G8929 Other chronic pain: Secondary | ICD-10-CM | POA: Diagnosis present

## 2022-02-22 DIAGNOSIS — E876 Hypokalemia: Secondary | ICD-10-CM

## 2022-02-22 DIAGNOSIS — R531 Weakness: Secondary | ICD-10-CM | POA: Diagnosis not present

## 2022-02-22 DIAGNOSIS — G9341 Metabolic encephalopathy: Secondary | ICD-10-CM | POA: Diagnosis present

## 2022-02-22 DIAGNOSIS — Z7902 Long term (current) use of antithrombotics/antiplatelets: Secondary | ICD-10-CM

## 2022-02-22 DIAGNOSIS — I4891 Unspecified atrial fibrillation: Secondary | ICD-10-CM | POA: Diagnosis not present

## 2022-02-22 DIAGNOSIS — K219 Gastro-esophageal reflux disease without esophagitis: Secondary | ICD-10-CM | POA: Diagnosis present

## 2022-02-22 DIAGNOSIS — R6889 Other general symptoms and signs: Secondary | ICD-10-CM | POA: Diagnosis not present

## 2022-02-22 DIAGNOSIS — N250 Renal osteodystrophy: Secondary | ICD-10-CM | POA: Diagnosis not present

## 2022-02-22 DIAGNOSIS — R451 Restlessness and agitation: Secondary | ICD-10-CM | POA: Diagnosis not present

## 2022-02-22 DIAGNOSIS — E7800 Pure hypercholesterolemia, unspecified: Secondary | ICD-10-CM | POA: Diagnosis not present

## 2022-02-22 HISTORY — DX: Elevation of levels of liver transaminase levels: R74.01

## 2022-02-22 HISTORY — DX: Presence of automatic (implantable) cardiac defibrillator: Z95.810

## 2022-02-22 HISTORY — DX: Other specified types of non-hodgkin lymphoma, unspecified site: C85.80

## 2022-02-22 HISTORY — DX: Nonrheumatic aortic (valve) stenosis: I35.0

## 2022-02-22 HISTORY — DX: Iron deficiency anemia, unspecified: D50.9

## 2022-02-22 HISTORY — DX: Chronic systolic (congestive) heart failure: I50.22

## 2022-02-22 HISTORY — DX: Permanent atrial fibrillation: I48.21

## 2022-02-22 LAB — URINALYSIS, ROUTINE W REFLEX MICROSCOPIC
Bilirubin Urine: NEGATIVE
Glucose, UA: NEGATIVE mg/dL
Hgb urine dipstick: NEGATIVE
Ketones, ur: NEGATIVE mg/dL
Leukocytes,Ua: NEGATIVE
Nitrite: NEGATIVE
Protein, ur: 30 mg/dL — AB
Specific Gravity, Urine: 1.013 (ref 1.005–1.030)
pH: 5 (ref 5.0–8.0)

## 2022-02-22 LAB — COMPREHENSIVE METABOLIC PANEL
ALT: 13 U/L (ref 0–44)
AST: 27 U/L (ref 15–41)
Albumin: 3.4 g/dL — ABNORMAL LOW (ref 3.5–5.0)
Alkaline Phosphatase: 323 U/L — ABNORMAL HIGH (ref 38–126)
Anion gap: 17 — ABNORMAL HIGH (ref 5–15)
BUN: 129 mg/dL — ABNORMAL HIGH (ref 8–23)
CO2: 25 mmol/L (ref 22–32)
Calcium: 9.7 mg/dL (ref 8.9–10.3)
Chloride: 93 mmol/L — ABNORMAL LOW (ref 98–111)
Creatinine, Ser: 4.04 mg/dL — ABNORMAL HIGH (ref 0.61–1.24)
GFR, Estimated: 15 mL/min — ABNORMAL LOW (ref >60)
Glucose, Bld: 231 mg/dL — ABNORMAL HIGH (ref 70–99)
Potassium: 3.3 mmol/L — ABNORMAL LOW (ref 3.5–5.1)
Sodium: 135 mmol/L (ref 135–145)
Total Bilirubin: 2.1 mg/dL — ABNORMAL HIGH (ref 0.3–1.2)
Total Protein: 6.6 g/dL (ref 6.5–8.1)

## 2022-02-22 LAB — CBC
HCT: 40.1 % (ref 39.0–52.0)
Hemoglobin: 12.6 g/dL — ABNORMAL LOW (ref 13.0–17.0)
MCH: 27.1 pg (ref 26.0–34.0)
MCHC: 31.4 g/dL (ref 30.0–36.0)
MCV: 86.2 fL (ref 80.0–100.0)
Platelets: 170 10*3/uL (ref 150–400)
RBC: 4.65 MIL/uL (ref 4.22–5.81)
RDW: 17.6 % — ABNORMAL HIGH (ref 11.5–15.5)
WBC: 4.6 10*3/uL (ref 4.0–10.5)
nRBC: 0 % (ref 0.0–0.2)

## 2022-02-22 LAB — MAGNESIUM: Magnesium: 2.5 mg/dL — ABNORMAL HIGH (ref 1.7–2.4)

## 2022-02-22 LAB — TROPONIN I (HIGH SENSITIVITY)
Troponin I (High Sensitivity): 54 ng/L — ABNORMAL HIGH (ref <18)
Troponin I (High Sensitivity): 61 ng/L — ABNORMAL HIGH (ref <18)

## 2022-02-22 LAB — PROTIME-INR
INR: 5.2 (ref 0.8–1.2)
Prothrombin Time: 47.5 seconds — ABNORMAL HIGH (ref 11.4–15.2)

## 2022-02-22 LAB — GLUCOSE, CAPILLARY: Glucose-Capillary: 202 mg/dL — ABNORMAL HIGH (ref 70–99)

## 2022-02-22 LAB — CBG MONITORING, ED: Glucose-Capillary: 190 mg/dL — ABNORMAL HIGH (ref 70–99)

## 2022-02-22 LAB — BRAIN NATRIURETIC PEPTIDE: B Natriuretic Peptide: 3004.6 pg/mL — ABNORMAL HIGH (ref 0.0–100.0)

## 2022-02-22 MED ORDER — POTASSIUM CHLORIDE CRYS ER 20 MEQ PO TBCR
40.0000 meq | EXTENDED_RELEASE_TABLET | Freq: Once | ORAL | Status: AC
  Administered 2022-02-22: 40 meq via ORAL
  Filled 2022-02-22: qty 2

## 2022-02-22 MED ORDER — FENTANYL CITRATE PF 50 MCG/ML IJ SOSY
50.0000 ug | PREFILLED_SYRINGE | Freq: Once | INTRAMUSCULAR | Status: AC
  Administered 2022-02-22: 50 ug via INTRAVENOUS
  Filled 2022-02-22: qty 1

## 2022-02-22 MED ORDER — INSULIN ASPART 100 UNIT/ML IJ SOLN
0.0000 [IU] | Freq: Three times a day (TID) | INTRAMUSCULAR | Status: DC
  Administered 2022-02-23 – 2022-02-24 (×5): 2 [IU] via SUBCUTANEOUS
  Administered 2022-02-25 (×2): 1 [IU] via SUBCUTANEOUS
  Administered 2022-02-25: 2 [IU] via SUBCUTANEOUS
  Administered 2022-02-26: 5 [IU] via SUBCUTANEOUS
  Administered 2022-02-26: 2 [IU] via SUBCUTANEOUS
  Administered 2022-02-26: 3 [IU] via SUBCUTANEOUS
  Administered 2022-02-27 (×2): 2 [IU] via SUBCUTANEOUS

## 2022-02-22 MED ORDER — INSULIN ASPART 100 UNIT/ML IJ SOLN
0.0000 [IU] | Freq: Every day | INTRAMUSCULAR | Status: DC
  Administered 2022-02-23 – 2022-02-26 (×2): 2 [IU] via SUBCUTANEOUS

## 2022-02-22 NOTE — ED Notes (Signed)
Called [facility] to transport patient to [facility] room# 18

## 2022-02-22 NOTE — ED Notes (Signed)
[facility] at the Bedside. 

## 2022-02-22 NOTE — ED Triage Notes (Addendum)
Sent from cardiology office. Wife reports confusion, fatigue, decreased appetite for several weeks. Also reports he is chronically short of breath. Denies pain

## 2022-02-22 NOTE — ED Provider Notes (Signed)
[facility] Provider Note   CSN: [id number] Arrival date & time: 02/22/22  1309     History  Chief Complaint  Patient presents with   Altered Mental Status    John [name] is a 71 y.o. male.  He has a history of CHF, CKD, chronic A-fib on anticoagulation, mitral valve replacement.  He had a prolonged admission back in March for fluid overload.  Has been slow to recover since then.  Has had some chronic lower extremity wounds for which he follows with wound clinic.  He was at cardiology clinic today with complaint of increased confusion for the past 3 weeks.  Cardiology felt his fluid balance was fairly good but needed to come to the emergency department for evaluation.  Wife is primarily given the history although patient does participate.  She has noticed he has been more forgetful and confused for the past 3 weeks.  Shortness of breath has been at baseline and he has been compliant with his medications.  She said the wound care clinic has been satisfied with his progress and does not feel his legs are infected.  He denies any headache blurry vision chest pain abdominal pain vomiting diarrhea or urinary symptoms.  He feels weak and numb in his legs but that is more of a chronic issue.  The history is provided by the patient and the spouse.  Altered Mental Status Presenting symptoms: confusion and memory loss   Most recent episode: 3 weeks. Timing:  Intermittent Progression:  Unchanged Chronicity:  New Associated symptoms: difficulty breathing   Associated symptoms: no abdominal pain, no fever, no hallucinations, no headaches, no visual change and no vomiting        Home Medications Prior to Admission medications   Medication Sig Start Date End Date Taking? Authorizing Provider  clopidogrel (PLAVIX) 75 MG tablet TAKE 1 TABLET BY MOUTH DAILY WITH BREAKFAST. 04/17/21   Furth, Cadence H, PA-C  FUROSCIX 80 MG/10ML CTKT Inject into the skin. 01/24/22    [provider]  glipiZIDE (GLUCOTROL XL) 10 MG 24 hr tablet Take 1 tablet (10 mg total) by mouth daily. 06/09/21   Claretta Fraise, MD  insulin glargine (LANTUS) 100 UNIT/ML injection Inject 0.7 mLs (70 Units total) into the skin at bedtime. Patient taking differently: Inject 70 Units into the skin. Take 70 units if over 150 daily 07/01/20   Furth, Cadence H, PA-C  JANUVIA 50 MG tablet Take 0.5 tablets (25 mg total) by mouth daily. 12/02/21   Kroeger, Lorelee Cover., PA-C  nitroGLYCERIN (NITROSTAT) 0.4 MG SL tablet Place 1 tablet (0.4 mg total) under the tongue every 5 (five) minutes as needed for chest pain. 01/15/22   Minus Breeding, MD  OneTouch Delica Lancets 16W MISC TEST 2 TIMES DAILY AS DIRECTED 02/24/21   Claretta Fraise, MD  [facility] VERIO test strip USE TO TEST TWICE A DAY 10/27/21   Claretta Fraise, MD  pantoprazole (PROTONIX) 40 MG tablet Take 1 tablet (40 mg total) by mouth daily. 06/09/21   Claretta Fraise, MD  rosuvastatin (CRESTOR) 20 MG tablet 20 mg. Patient takes 1 tablet by mouth every other day.    [provider]  sodium bicarbonate 650 MG tablet Take 650 mg by mouth 2 (two) times daily.    [provider]  torsemide (DEMADEX) 20 MG tablet Take 40 mg by mouth 2 (two) times daily. If weight increases, increase to 60 mg bid    [provider]  warfarin (COUMADIN) 5 MG  tablet Take 1 tablet (5 mg total) by mouth daily. Patient taking differently: Take 5 mg by mouth daily. Alternates 2.5 x2 days then '5mg'$  alternating 04/03/21   Claretta Fraise, MD      Allergies    Allopurinol, Doxycycline, and Farxiga [dapagliflozin]    Review of Systems   Review of Systems  Constitutional:  Negative for fever.  HENT:  Negative for sore throat.   Eyes:  Negative for visual disturbance.  Respiratory:  Positive for shortness of breath.   Cardiovascular:  Positive for leg swelling. Negative for chest pain.  Gastrointestinal:  Negative for abdominal pain and vomiting.   Musculoskeletal:  Positive for gait problem.  Skin:  Positive for wound.  Neurological:  Negative for headaches.  Psychiatric/Behavioral:  Positive for confusion and memory loss. Negative for hallucinations.     Physical Exam Updated Vital Signs BP (!) 100/57   Pulse 88   Temp (!) 97.3 F (36.3 C)   Resp 17   SpO2 99%  Physical Exam Vitals and nursing note reviewed.  Constitutional:      General: He is not in acute distress.    Appearance: Normal appearance. He is well-developed.  HENT:     Head: Normocephalic and atraumatic.  Eyes:     Conjunctiva/sclera: Conjunctivae normal.  Cardiovascular:     Rate and Rhythm: Normal rate. Rhythm irregular.     Pulses: Normal pulses.     Heart sounds: No murmur heard. Pulmonary:     Effort: Pulmonary effort is normal. No respiratory distress.     Breath sounds: Normal breath sounds.  Abdominal:     Palpations: Abdomen is soft.     Tenderness: There is no abdominal tenderness. There is no guarding or rebound.  Musculoskeletal:        General: Normal range of motion.     Cervical back: Neck supple.     Right lower leg: Edema present.     Left lower leg: Edema present.  Skin:    General: Skin is warm and dry.     Capillary Refill: Capillary refill takes less than 2 seconds.  Neurological:     General: No focal deficit present.     Mental Status: He is alert and oriented to person, place, and time.     Cranial Nerves: No cranial nerve deficit.     Motor: No weakness.     Comments: He is awake and alert.  He knows the day of the week and the month and the year.  He knows the president.  He is able to name objects.     ED Results / Procedures / Treatments   Labs (all labs ordered are listed, but only abnormal results are displayed) Labs Reviewed  COMPREHENSIVE METABOLIC PANEL - Abnormal; Notable for the following components:      Result Value   Potassium 3.3 (*)    Chloride 93 (*)    Glucose, Bld 231 (*)    BUN 129 (*)     Creatinine, Ser 4.04 (*)    Albumin 3.4 (*)    Alkaline Phosphatase 323 (*)    Total Bilirubin 2.1 (*)    GFR, Estimated 15 (*)    Anion gap 17 (*)    All other components within normal limits  CBC - Abnormal; Notable for the following components:   Hemoglobin 12.6 (*)    RDW 17.6 (*)    All other components within normal limits  BRAIN NATRIURETIC PEPTIDE - Abnormal; Notable for the following  components:   B Natriuretic Peptide 3,004.6 (*)    All other components within normal limits  PROTIME-INR - Abnormal; Notable for the following components:   Prothrombin Time 47.5 (*)    INR 5.2 (*)    All other components within normal limits  URINALYSIS, ROUTINE W REFLEX MICROSCOPIC - Abnormal; Notable for the following components:   APPearance HAZY (*)    Protein, ur 30 (*)    All other components within normal limits  MAGNESIUM - Abnormal; Notable for the following components:   Magnesium 2.5 (*)    All other components within normal limits  TROPONIN I (HIGH SENSITIVITY) - Abnormal; Notable for the following components:   Troponin I (High Sensitivity) 54 (*)    All other components within normal limits  TROPONIN I (HIGH SENSITIVITY) - Abnormal; Notable for the following components:   Troponin I (High Sensitivity) 61 (*)    All other components within normal limits    EKG EKG Interpretation  Date/Time:  Thursday February 22 2022 13:31:07 EDT Ventricular Rate:  104 PR Interval:    QRS Duration: 174 QT Interval:  400 QTC Calculation: 526 R Axis:   129 Text Interpretation: Atrial fibrillation with rapid ventricular response Right bundle branch block Septal infarct , age undetermined T wave abnormality, consider inferolateral ischemia Abnormal ECG When compared with ECG of 23-Jan-2022 15:50, No significant change since last tracing Confirmed by Aletta Edouard [id number]) on 02/22/2022 1:50:23 PM  Radiology CT Head Wo Contrast  Result Date: 02/22/2022 CLINICAL DATA:  Provided history:  Mental status change, unknown cause. EXAM: CT HEAD WITHOUT CONTRAST TECHNIQUE: Contiguous axial images were obtained from the base of the skull through the vertex without intravenous contrast. RADIATION DOSE REDUCTION: This exam was performed according to the departmental dose-optimization program which includes automated exposure control, adjustment of the mA and/or kV according to patient size and/or use of iterative reconstruction technique. COMPARISON:  Prior head CT 09/29/2011. FINDINGS: Brain: Mild-to-moderate cerebral atrophy. Mild cerebellar atrophy. 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 Suspected small chronic cortical/subcortical infarct within the high right parietal lobe, new from the prior head CT of 09/29/2011 (series 2, images 22-26). There is no acute intracranial hemorrhage. No acute demarcated cortical infarct. No extra-axial fluid collection. No evidence of an intracranial mass. No midline shift. Vascular: No hyperdense vessel. Atherosclerotic calcifications. Skull: No fracture or aggressive osseous lesion. Sinuses/Orbits: No mass or acute finding within the imaged orbits. No significant paranasal sinus disease at the imaged levels. IMPRESSION: No evidence of acute intracranial abnormality. 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 Suspected small chronic cortical/subcortical infarct within the high right parietal lobe, new from the prior head CT of 09/29/2011 (MCA/ACA watershed territory). 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 Mild-to-moderate cerebral atrophy. Mild cerebellar atrophy. Electronically Signed   By: Kellie Simmering D.O.   On: 02/22/2022 14:56   DG Chest Port 1 View  Result Date: 02/22/2022 CLINICAL DATA:  weakness EXAM: PORTABLE CHEST 1 VIEW COMPARISON:  December 13, 2021 FINDINGS: Again seen are the sternotomy wires, CABG changes and mitral valve replacement changes. Stable right subclavian single lead pacemaker defibrillator. Cardiomegaly. Bilateral pleural effusion greater on the left and is new. Mild central pulmonary vascular congestion. No focal consolidation. The visualized skeletal structures are unremarkable. IMPRESSION: Bilateral small pleural effusion greater on the left and is new. Cardiomegaly and mild central pulmonary  vascular congestion. Electronically Signed   By: Frazier Richards M.D.   On: 02/22/2022 14:47    Procedures Procedures    Medications Ordered in ED Medications  potassium chloride SA (KLOR-CON M) CR tablet 40 mEq (40 mEq Oral Given 02/22/22 1700)    ED Course/ Medical Decision Making/ A&P Clinical Course as of 02/22/22 1717  Thu Feb 22, 2022  1458 Chest x-ray interpreted by me as cardiomegaly probable left effusion small right effusion [MB]  1518 Reviewed results with wife and patient.  She and he are both unaware that there was a prior stroke on CT.  It is unclear whether he can get an MRI as he has both a pacemaker and a mechanical valve.  He also has worsening of his renal function.  Per the cardiology note they were concerned he may have some hepatorenal syndrome. [MB]  3545 Discussed with Dr. Avon Gully [facility] hospitalist will put the patient in for a bed.  Family updated. [MB]    Clinical Course User Index [MB] Hayden Rasmussen, MD                           Medical Decision Making Amount and/or Complexity of Data Reviewed Labs: ordered. Radiology: ordered.  Risk Decision regarding hospitalization.   This patient complains of confusion and shortness of breath, poor food intake; this involves an extensive number of treatment Options and is a complaint that carries with it a high risk of complications and morbidity. The differential includes stroke, bleed, cephalopathy, renal failure, ACS, CHF, hypoxia potentially  I  ordered, reviewed and interpreted labs, which included CBC with normal white count stable hemoglobin, chemistries with low potassium elevated creatinine from baseline, chronically elevated ALT infection, INR supratherapeutic, troponin elevated flat, BNP elevated I ordered medication oral potassium and reviewed PMP when indicated. I ordered imaging studies which included chest x-ray and head CT and I independently    visualized and interpreted imaging which showed small effusions and fluid overload, old strokes no acute Additional history obtained from patient's wife Previous records obtained and reviewed epic including prior cardiology note today I consulted [facility] hospitalist Dr. Avon Gully and discussed lab and imaging findings and discussed disposition.  Cardiac monitoring reviewed, atrial fibrillation Social determinants considered, no significant barriers Critical Interventions: None  After the interventions stated above, I reevaluated the patient and found patient to be nonfocal and fairly comfortable.  Blood pressure soft. Admission and further testing considered, patient benefit from admission for gentle hydration and further work-up of altered mental status.  Patient in agreement with plan         Final Clinical Impression(s) / ED Diagnoses Final diagnoses:  Acute confusion  AKI (acute kidney injury) ([place])  Hypokalemia    Rx / DC Orders ED Discharge Orders     None         Hayden Rasmussen, MD 02/22/22 1719

## 2022-02-22 NOTE — ED Notes (Signed)
Lil - RN and Barnett Applebaum - RN aware of pt's BP

## 2022-02-22 NOTE — ED Notes (Signed)
Patient transported to Imaging at this Time. 

## 2022-02-22 NOTE — ED Notes (Signed)
Pt given a urinal. Family member will inform, once pt urinates.

## 2022-02-22 NOTE — Patient Instructions (Signed)
Proceed to ER at [place],  [place]  [id number]     Follow up: 1 month in heart failure clinic 3 months with Dr. Percival Spanish

## 2022-02-22 NOTE — ED Notes (Signed)
Patient returned from Imaging.

## 2022-02-22 NOTE — Progress Notes (Signed)
DAHL HIGINBOTHAM ([id number]) , Visit Report for 02/22/2022 Arrival Information Details Patient Name: Date of Service: [name] 02/22/2022 10:30 A M Medical Record Number: [id number] Patient Account Number: [id number] Date of Birth/Sex: Treating RN: September 21, 1950 (71 y.o. [name] Primary Care Jenavieve Freda: Claretta Fraise Other Clinician: Referring Ivorie Uplinger: Treating Sarah Baez/Extender: Lucienne Capers in Treatment: 3 Visit Information History Since Last Visit Added or deleted any medications: No Patient Arrived: Ambulatory Any new allergies or adverse reactions: No Arrival Time: 11:00 Had a fall or experienced change in No Accompanied By: alone activities of daily living that may affect Transfer Assistance: None risk of falls: Patient Identification Verified: Yes Signs or symptoms of abuse/neglect since last visito No Secondary Verification Process Completed: Yes Hospitalized since last visit: No Patient Has Alerts: Yes Implantable device outside of the clinic excluding No Patient Alerts: Patient on Blood Thinner cellular tissue based products placed in the center ABIs non comp bilat since last visit: Has Dressing in Place as Prescribed: Yes Has Compression in Place as Prescribed: Yes Pain Present Now: Yes Electronic Signature(s) Signed: 02/22/2022 5:36:06 PM By: Levan Hurst RN, BSN Entered By: Levan Hurst on 02/22/2022 11:01:49 -------------------------------------------------------------------------------- Encounter Discharge Information Details Patient Name: Date of Service: [name] 02/22/2022 10:30 A M Medical Record Number: [id number] Patient Account Number: [id number] Date of Birth/Sex: Treating RN: January 04, 1951 (71 y.o. [name] Primary Care Braeton Wolgamott: Claretta Fraise Other Clinician: Referring Ylianna Almanzar: Treating Viriginia Amendola/Extender: Lucienne Capers in Treatment: 3 Encounter Discharge  Information Items Post Procedure Vitals Discharge Condition: Stable Temperature (F): 97.8 Ambulatory Status: Ambulatory Pulse (bpm): 73 Discharge Destination: Home Respiratory Rate (breaths/min): 20 Transportation: Private Auto Blood Pressure (mmHg): 114/76 Accompanied By: wife Schedule Follow-up Appointment: Yes Clinical Summary of Care: Patient Declined Electronic Signature(s) Signed: 02/22/2022 5:36:06 PM By: Levan Hurst RN, BSN Entered By: Levan Hurst on 02/22/2022 17:24:09 -------------------------------------------------------------------------------- Lower Extremity Assessment Details Patient Name: Date of Service: [name] 02/22/2022 10:30 A M Medical Record Number: [id number] Patient Account Number: [id number] Date of Birth/Sex: Treating RN: [birth year] (71 y.o. [name] Primary Care Caleigha Zale: Claretta Fraise Other Clinician: Referring Lexxie Winberg: Treating Lewie Deman/Extender: Lucienne Capers in Treatment: 3 Edema Assessment Assessed: Shirlyn Goltz: No] [Right: No] Edema: [Left: Yes] [Right: Yes] Calf Left: Right: Point of Measurement: 36 cm From Medial Instep 33 cm 32 cm Ankle Left: Right: Point of Measurement: 12 cm From Medial Instep 22.5 cm 23 cm Vascular Assessment Pulses: Dorsalis Pedis Palpable: [Left:Yes] [Right:Yes] Electronic Signature(s) Signed: 02/22/2022 5:36:06 PM By: Levan Hurst RN, BSN Entered By: Levan Hurst on 02/22/2022 11:18:04 -------------------------------------------------------------------------------- Multi Wound Chart Details Patient Name: Date of Service: [name] 02/22/2022 10:30 A M Medical Record Number: [id number] Patient Account Number: [id number] Date of Birth/Sex: Treating RN: [birth year] (71 y.o. [name] Primary Care Charonda Hefter: Claretta Fraise Other Clinician: Referring Maciej Schweitzer: Treating Juanantonio Stolar/Extender: Lucienne Capers in Treatment:  3 Vital Signs Height(in): 62 Pulse(bpm): 77 Weight(lbs): 180 Blood Pressure(mmHg): 114/76 Body Mass Index(BMI): 25.1 Temperature(F): 97.8 Respiratory Rate(breaths/min): 20 Photos: Left, Anterior Lower Leg Left, Medial Lower Leg Left, Proximal, Posterior Lower Leg Wound Location: Blister Blister Blister Wounding Event: Venous Leg Ulcer Venous Leg Ulcer Venous Leg Ulcer Primary Etiology: Glaucoma, Anemia, Arrhythmia, Glaucoma, Anemia, Arrhythmia, Glaucoma, Anemia, Arrhythmia, Comorbid History: Congestive Heart Failure, Coronary Congestive Heart Failure, Coronary Congestive Heart Failure, Coronary Artery Disease, Hypertension, Artery Disease, Hypertension, Artery Disease, Hypertension, Peripheral Venous Disease, Type II Peripheral  Venous Disease, Type II Peripheral Venous Disease, Type II Diabetes, Osteoarthritis Diabetes, Osteoarthritis Diabetes, Osteoarthritis 01/24/2022 01/24/2022 01/24/2022 Date Acquired: _0 Weeks of Treatment: Open Open Open Wound Status: No No No Wound Recurrence: No No No Clustered Wound: N/A N/A N/A Clustered Quantity: 4.7x7x0.1 2x3.5x0.1 0x0x0 Measurements L x W x D (cm) 25.84 5.498 0 A (cm) : rea 2.584 0.55 0 Volume (cm) : 29.10% 48.10% 100.00% % Reduction in A rea: 29.10% 48.10% 100.00% % Reduction in Volume: Full Thickness Without Exposed Full Thickness Without Exposed Full Thickness Without Exposed Classification: Support Structures Support Structures Support Structures Medium Medium None Present Exudate A mount: Serosanguineous Serosanguineous N/A Exudate Type: red, brown red, brown N/A Exudate Color: Flat and Intact Flat and Intact Flat and Intact Wound Margin: Large (67-100%) Large (67-100%) None Present (0%) Granulation A mount: Red, Pink Red, Pink N/A Granulation Quality: Small (1-33%) Small (1-33%) None Present (0%) Necrotic A mount: Fat Layer (Subcutaneous Tissue): Yes Fat Layer (Subcutaneous Tissue): Yes Fascia:  No Exposed Structures: Fascia: No Fascia: No Fat Layer (Subcutaneous Tissue): No Tendon: No Tendon: No Tendon: No Muscle: No Muscle: No Muscle: No Joint: No Joint: No Joint: No Bone: No Bone: No Bone: No Small (1-33%) Small (1-33%) Large (67-100%) Epithelialization: Debridement - Selective/Open Wound Debridement - Selective/Open Wound N/A Debridement: Pre-procedure Verification/Time Out 11:24 11:24 N/A Taken: Art therapist, Psychologist, prison and probation services, Slough N/A Tissue Debrided: Non-Viable Tissue Non-Viable Tissue N/A Level: 32.9 7 N/A Debridement A (sq cm): rea Curette Curette N/A Instrument: Minimum Minimum N/A Bleeding: Pressure Pressure N/A Hemostasis A chieved: 0 0 N/A Procedural Pain: 0 0 N/A Post Procedural Pain: Procedure was tolerated well Procedure was tolerated well N/A Debridement Treatment Response: 4.7x7x0.1 2x3.5x0.1 N/A Post Debridement Measurements L x W x D (cm) 2.584 0.55 N/A Post Debridement Volume: (cm) Debridement Debridement N/A Procedures Performed: Wound Number: 5 N/A N/A Photos: N/A N/A Right, Circumferential Lower Leg N/A N/A Wound Location: Blister N/A N/A Wounding Event: Venous Leg Ulcer N/A N/A Primary Etiology: Glaucoma, Anemia, Arrhythmia, N/A N/A Comorbid History: Congestive Heart Failure, Coronary Artery Disease, Hypertension, Peripheral Venous Disease, Type II Diabetes, Osteoarthritis 01/24/2022 N/A N/A Date Acquired: 3 N/A N/A Weeks of Treatment: Open N/A N/A Wound Status: No N/A N/A Wound Recurrence: Yes N/A N/A Clustered Wound: 6 N/A N/A Clustered Quantity: 11.4x16.3x0.1 N/A N/A Measurements L x W x D (cm) 145.943 N/A N/A A (cm) : rea 14.594 N/A N/A Volume (cm) : 57.30% N/A N/A % Reduction in Area: 57.30% N/A N/A % Reduction in Volume: Full Thickness Without Exposed N/A N/A Classification: Support Structures Large N/A N/A Exudate Amount: Serosanguineous N/A N/A Exudate Type: red, brown  N/A N/A Exudate Color: Flat and Intact N/A N/A Wound Margin: Large (67-100%) N/A N/A Granulation Amount: Red, Pink N/A N/A Granulation Quality: Small (1-33%) N/A N/A Necrotic Amount: Fat Layer (Subcutaneous Tissue): Yes N/A N/A Exposed Structures: Fascia: No Tendon: No Muscle: No Joint: No Bone: No Small (1-33%) N/A N/A Epithelialization: Debridement - Selective/Open Wound N/A N/A Debridement: Pre-procedure Verification/Time Out 11:24 N/A N/A Taken: Necrotic/Eschar, Slough N/A N/A Tissue Debrided: Non-Viable Tissue N/A N/A Level: 16 N/A N/A Debridement A (sq cm): rea Curette N/A N/A Instrument: Minimum N/A N/A Bleeding: Pressure N/A N/A Hemostasis A chieved: 0 N/A N/A Procedural Pain: 0 N/A N/A Post Procedural Pain: Procedure was tolerated well N/A N/A Debridement Treatment Response: 11.4x16.3x0.1 N/A N/A Post Debridement Measurements L x W x D (cm) 14.594 N/A N/A Post Debridement Volume: (cm) Debridement N/A N/A Procedures Performed: Treatment Notes Electronic  Signature(s) Signed: 02/22/2022 11:33:54 AM By: Fredirick Maudlin MD FACS Signed: 02/22/2022 5:36:06 PM By: Levan Hurst RN, BSN Entered By: Fredirick Maudlin on 02/22/2022 11:33:54 -------------------------------------------------------------------------------- Multi-Disciplinary Care Plan Details Patient Name: Date of Service: [name] 02/22/2022 10:30 A M Medical Record Number: [id number] Patient Account Number: [id number] Date of Birth/Sex: Treating RN: 07/28/1951 (71 y.o. [name] Primary Care Norely Schlick: Claretta Fraise Other Clinician: Referring Kwali Wrinkle: Treating Amijah Timothy/Extender: Lucienne Capers in Treatment: 3 Multidisciplinary Care Plan reviewed with physician Active Inactive Abuse / Safety / Falls / Self Care Management Nursing Diagnoses: Potential for falls Potential for injury related to falls Goals: Patient will not experience any  injury related to falls Date Initiated: 01/31/2022 Target Resolution Date: 03/02/2022 Goal Status: Active Patient/caregiver will verbalize/demonstrate measures taken to prevent injury and/or falls Date Initiated: 01/31/2022 Date Inactivated: 02/15/2022 Target Resolution Date: 03/02/2022 Goal Status: Met Interventions: Assess Activities of Daily Living upon admission and as needed Assess fall risk on admission and as needed Assess: immobility, friction, shearing, incontinence upon admission and as needed Assess impairment of mobility on admission and as needed per policy Assess personal safety and home safety (as indicated) on admission and as needed Assess self care needs on admission and as needed Provide education on personal and home safety Notes: Nutrition Nursing Diagnoses: Impaired glucose control: actual or potential Potential for alteratiion in Nutrition/Potential for imbalanced nutrition Goals: Patient/caregiver agrees to and verbalizes understanding of need to use nutritional supplements and/or vitamins as prescribed Date Initiated: 01/31/2022 Date Inactivated: 02/15/2022 Target Resolution Date: 03/02/2022 Goal Status: Met Patient/caregiver will maintain therapeutic glucose control Date Initiated: 01/31/2022 Target Resolution Date: 03/02/2022 Goal Status: Active Interventions: Assess HgA1c results as ordered upon admission and as needed Assess patient nutrition upon admission and as needed per policy Provide education on elevated blood sugars and impact on wound healing Provide education on nutrition Treatment Activities: Education provided on Nutrition : 01/31/2022 Notes: Venous Leg Ulcer Nursing Diagnoses: Knowledge deficit related to disease process and management Goals: Patient will maintain optimal edema control Date Initiated: 01/31/2022 Target Resolution Date: 03/02/2022 Goal Status: Active Patient/caregiver will verbalize understanding of disease process and disease  management Date Initiated: 01/31/2022 Target Resolution Date: 03/02/2022 Goal Status: Active Interventions: Assess peripheral edema status every visit. Compression as ordered Provide education on venous insufficiency Notes: Wound/Skin Impairment Nursing Diagnoses: Impaired tissue integrity Knowledge deficit related to ulceration/compromised skin integrity Goals: Patient/caregiver will verbalize understanding of skin care regimen Date Initiated: 01/31/2022 Target Resolution Date: 03/02/2022 Goal Status: Active Interventions: Assess patient/caregiver ability to obtain necessary supplies Assess patient/caregiver ability to perform ulcer/skin care regimen upon admission and as needed Assess ulceration(s) every visit Provide education on ulcer and skin care Notes: Electronic Signature(s) Signed: 02/22/2022 5:36:06 PM By: Levan Hurst RN, BSN Entered By: Levan Hurst on 02/22/2022 17:21:50 -------------------------------------------------------------------------------- Pain Assessment Details Patient Name: Date of Service: [name] 02/22/2022 10:30 A M Medical Record Number: [id number] Patient Account Number: [id number] Date of Birth/Sex: Treating RN: 1950/12/27 (71 y.o. [name] Primary Care Jaystin Mcgarvey: Claretta Fraise Other Clinician: Referring Tayshaun Kroh: Treating Angelys Yetman/Extender: Lucienne Capers in Treatment: 3 Active Problems Location of Pain Severity and Description of Pain Patient Has Paino Yes Site Locations Pain Location: Pain in Ulcers With Dressing Change: Yes Duration of the Pain. Constant / Intermittento Intermittent Character of Pain Describe the Pain: Burning Pain Management and Medication Current Pain Management: Medication: Yes Cold Application: No Rest: No Massage: No Activity: No T.E.N.S.: No  Heat Application: No Leg drop or elevation: No Is the Current Pain Management Adequate: Adequate How does your  wound impact your activities of daily livingo Sleep: No Bathing: No Appetite: No Relationship With Others: No Bladder Continence: No Emotions: No Bowel Continence: No Work: No Toileting: No Drive: No Dressing: No Hobbies: No Electronic Signature(s) Signed: 02/22/2022 5:36:06 PM By: Levan Hurst RN, BSN Entered By: Levan Hurst on 02/22/2022 11:02:27 -------------------------------------------------------------------------------- Patient/Caregiver Education Details Patient Name: Date of Service: [name] 6/15/2023andnbsp10:30 A M Medical Record Number: [id number] Patient Account Number: [id number] Date of Birth/Gender: Treating RN: 1951-05-19 (71 y.o. [name] Primary Care Physician: Claretta Fraise Other Clinician: Referring Physician: Treating Physician/Extender: Lucienne Capers in Treatment: 3 Education Assessment Education Provided To: Patient Education Topics Provided Wound/Skin Impairment: Methods: Explain/Verbal Responses: State content correctly Electronic Signature(s) Signed: 02/22/2022 5:36:06 PM By: Levan Hurst RN, BSN Entered By: Levan Hurst on 02/22/2022 17:22:02 -------------------------------------------------------------------------------- Wound Assessment Details Patient Name: Date of Service: [name] 02/22/2022 10:30 A M Medical Record Number: [id number] Patient Account Number: [id number] Date of Birth/Sex: Treating RN: [birth year] (71 y.o. [name] Primary Care Daking Westervelt: Claretta Fraise Other Clinician: Referring Elif Yonts: Treating Nocholas Damaso/Extender: Lucienne Capers in Treatment: 3 Wound Status Wound Number: 1 Primary Venous Leg Ulcer Etiology: Wound Location: Left, Anterior Lower Leg Wound Open Wounding Event: Blister Status: Date Acquired: 01/24/2022 Comorbid Glaucoma, Anemia, Arrhythmia, Congestive Heart Failure, Coronary Weeks Of Treatment:  3 History: Artery Disease, Hypertension, Peripheral Venous Disease, Type II Clustered Wound: No Diabetes, Osteoarthritis Photos Wound Measurements Length: (cm) 4.7 Width: (cm) 7 Depth: (cm) 0.1 Area: (cm) 25.84 Volume: (cm) 2.584 % Reduction in Area: 29.1% % Reduction in Volume: 29.1% Epithelialization: Small (1-33%) Tunneling: No Undermining: No Wound Description Classification: Full Thickness Without Exposed Support Structures Wound Margin: Flat and Intact Exudate Amount: Medium Exudate Type: Serosanguineous Exudate Color: red, brown Foul Odor After Cleansing: No Slough/Fibrino Yes Wound Bed Granulation Amount: Large (67-100%) Exposed Structure Granulation Quality: Red, Pink Fascia Exposed: No Necrotic Amount: Small (1-33%) Fat Layer (Subcutaneous Tissue) Exposed: Yes Necrotic Quality: Adherent Slough Tendon Exposed: No Muscle Exposed: No Joint Exposed: No Bone Exposed: No Treatment Notes Wound #1 (Lower Leg) Wound Laterality: Left, Anterior Cleanser Soap and Water Discharge Instruction: May shower and wash wound with dial antibacterial soap and water prior to dressing change. Wound Cleanser Discharge Instruction: Cleanse the wound with wound cleanser prior to applying a clean dressing using gauze sponges, not tissue or cotton balls. Peri-Wound Care Topical Primary Dressing Maxorb Extra Calcium Alginate Dressing, 4x4 in Discharge Instruction: Apply calcium alginate to wound bed as instructed Secondary Dressing ABD Pad, 8x10 Discharge Instruction: Apply over primary dressing as directed. Woven Gauze Sponge, Non-Sterile 4x4 in Discharge Instruction: Apply over primary dressing as directed. Secured With Elastic Bandage 4 inch (ACE bandage) Discharge Instruction: Secure with ACE bandage as directed. Kerlix Roll Sterile, 4.5x3.1 (in/yd) Discharge Instruction: Secure with Kerlix as directed. Paper Tape, 2x10 (in/yd) Discharge Instruction: Secure dressing with  tape as directed. Stretch Net Size 5, 10 (yds) Compression Wrap Compression Stockings Add-Ons Electronic Signature(s) Signed: 02/22/2022 5:36:06 PM By: Levan Hurst RN, BSN Entered By: Levan Hurst on 02/22/2022 11:15:57 -------------------------------------------------------------------------------- Wound Assessment Details Patient Name: Date of Service: [name] 02/22/2022 10:30 A M Medical Record Number: [id number] Patient Account Number: [id number] Date of Birth/Sex: Treating RN: 1951/05/17 (71 y.o. [name] Primary Care Kenzli Barritt: Claretta Fraise Other Clinician: Referring Charmain Diosdado: Treating Jhanvi Drakeford/Extender: Celine Ahr  Londell Moh, Cletus Gash Weeks in Treatment: 3 Wound Status Wound Number: 2 Primary Venous Leg Ulcer Etiology: Wound Location: Left, Medial Lower Leg Wound Open Wounding Event: Blister Status: Date Acquired: 01/24/2022 Comorbid Glaucoma, Anemia, Arrhythmia, Congestive Heart Failure, Coronary Weeks Of Treatment: 3 History: Artery Disease, Hypertension, Peripheral Venous Disease, Type II Clustered Wound: No Diabetes, Osteoarthritis Photos Wound Measurements Length: (cm) 2 Width: (cm) 3.5 Depth: (cm) 0.1 Area: (cm) 5.498 Volume: (cm) 0.55 % Reduction in Area: 48.1% % Reduction in Volume: 48.1% Epithelialization: Small (1-33%) Tunneling: No Undermining: No Wound Description Classification: Full Thickness Without Exposed Support Structures Wound Margin: Flat and Intact Exudate Amount: Medium Exudate Type: Serosanguineous Exudate Color: red, brown Foul Odor After Cleansing: No Slough/Fibrino Yes Wound Bed Granulation Amount: Large (67-100%) Exposed Structure Granulation Quality: Red, Pink Fascia Exposed: No Necrotic Amount: Small (1-33%) Fat Layer (Subcutaneous Tissue) Exposed: Yes Necrotic Quality: Adherent Slough Tendon Exposed: No Muscle Exposed: No Joint Exposed: No Bone Exposed: No Treatment Notes Wound #2 (Lower  Leg) Wound Laterality: Left, Medial Cleanser Soap and Water Discharge Instruction: May shower and wash wound with dial antibacterial soap and water prior to dressing change. Wound Cleanser Discharge Instruction: Cleanse the wound with wound cleanser prior to applying a clean dressing using gauze sponges, not tissue or cotton balls. Peri-Wound Care Topical Primary Dressing Maxorb Extra Calcium Alginate Dressing, 4x4 in Discharge Instruction: Apply calcium alginate to wound bed as instructed Secondary Dressing ABD Pad, 5x9 Discharge Instruction: Apply over primary dressing as directed. Woven Gauze Sponge, Non-Sterile 4x4 in Discharge Instruction: Apply over primary dressing as directed. Secured With Elastic Bandage 4 inch (ACE bandage) Discharge Instruction: Secure with ACE bandage as directed. Kerlix Roll Sterile, 4.5x3.1 (in/yd) Discharge Instruction: Secure with Kerlix as directed. Paper Tape, 2x10 (in/yd) Discharge Instruction: Secure dressing with tape as directed. Compression Wrap Compression Stockings Add-Ons Electronic Signature(s) Signed: 02/22/2022 5:36:06 PM By: Levan Hurst RN, BSN Entered By: Levan Hurst on 02/22/2022 11:17:03 -------------------------------------------------------------------------------- Wound Assessment Details Patient Name: Date of Service: [name] 02/22/2022 10:30 A M Medical Record Number: [id number] Patient Account Number: [id number] Date of Birth/Sex: Treating RN: 07-13-1951 (71 y.o. [name] Primary Care Elleana Stillson: Claretta Fraise Other Clinician: Referring Keaghan Bowens: Treating Quandarius Nill/Extender: Lucienne Capers in Treatment: 3 Wound Status Wound Number: 3 Primary Venous Leg Ulcer Etiology: Wound Location: Left, Proximal, Posterior Lower Leg Wound Open Wounding Event: Blister Status: Date Acquired: 01/24/2022 Comorbid Glaucoma, Anemia, Arrhythmia, Congestive Heart Failure, Coronary Weeks  Of Treatment: 3 History: Artery Disease, Hypertension, Peripheral Venous Disease, Type II Clustered Wound: No Diabetes, Osteoarthritis Photos Wound Measurements Length: (cm) Width: (cm) Depth: (cm) Area: (cm) Volume: (cm) 0 % Reduction in Area: 100% 0 % Reduction in Volume: 100% 0 Epithelialization: Large (67-100%) 0 Tunneling: No 0 Undermining: No Wound Description Classification: Full Thickness Without Exposed Support Structures Wound Margin: Flat and Intact Exudate Amount: None Present Foul Odor After Cleansing: No Slough/Fibrino No Wound Bed Granulation Amount: None Present (0%) Exposed Structure Necrotic Amount: None Present (0%) Fascia Exposed: No Fat Layer (Subcutaneous Tissue) Exposed: No Tendon Exposed: No Muscle Exposed: No Joint Exposed: No Bone Exposed: No Electronic Signature(s) Signed: 02/22/2022 5:36:06 PM By: Levan Hurst RN, BSN Entered By: Levan Hurst on 02/22/2022 11:16:32 -------------------------------------------------------------------------------- Wound Assessment Details Patient Name: Date of Service: [name] 02/22/2022 10:30 A M Medical Record Number: [id number] Patient Account Number: [id number] Date of Birth/Sex: Treating RN: Nov 01, 1950 (71 y.o. [name] Primary Care Kaprice Kage: Claretta Fraise Other Clinician: Referring  Usman Millett: Treating Ever Gustafson/Extender: Lucienne Capers in Treatment: 3 Wound Status Wound Number: 5 Primary Venous Leg Ulcer Etiology: Wound Location: Right, Circumferential Lower Leg Wound Open Wounding Event: Blister Status: Date Acquired: 01/24/2022 Comorbid Glaucoma, Anemia, Arrhythmia, Congestive Heart Failure, Weeks Of Treatment: 3 History: Coronary Artery Disease, Hypertension, Peripheral Venous Clustered Wound: Yes Disease, Type II Diabetes, Osteoarthritis Photos Wound Measurements Length: (cm) 11.4 Width: (cm) 16.3 Depth: (cm) 0.1 Clustered Quantity:  6 Area: (cm) 145.943 Volume: (cm) 14.594 % Reduction in Area: 57.3% % Reduction in Volume: 57.3% Epithelialization: Small (1-33%) Tunneling: No Undermining: No Wound Description Classification: Full Thickness Without Exposed Support Structures Wound Margin: Flat and Intact Exudate Amount: Large Exudate Type: Serosanguineous Exudate Color: red, brown Foul Odor After Cleansing: No Slough/Fibrino Yes Wound Bed Granulation Amount: Large (67-100%) Exposed Structure Granulation Quality: Red, Pink Fascia Exposed: No Necrotic Amount: Small (1-33%) Fat Layer (Subcutaneous Tissue) Exposed: Yes Necrotic Quality: Adherent Slough Tendon Exposed: No Muscle Exposed: No Joint Exposed: No Bone Exposed: No Treatment Notes Wound #5 (Lower Leg) Wound Laterality: Right, Circumferential Cleanser Soap and Water Discharge Instruction: May shower and wash wound with dial antibacterial soap and water prior to dressing change. Wound Cleanser Discharge Instruction: Cleanse the wound with wound cleanser prior to applying a clean dressing using gauze sponges, not tissue or cotton balls. Peri-Wound Care Topical Primary Dressing Maxorb Extra Calcium Alginate Dressing, 4x4 in Discharge Instruction: Apply calcium alginate to wound bed as instructed Secondary Dressing ABD Pad, 8x10 Discharge Instruction: Apply over primary dressing as directed. Woven Gauze Sponge, Non-Sterile 4x4 in Discharge Instruction: Apply over primary dressing as directed. Secured With Elastic Bandage 4 inch (ACE bandage) Discharge Instruction: Secure with ACE bandage as directed. Kerlix Roll Sterile, 4.5x3.1 (in/yd) Discharge Instruction: Secure with Kerlix as directed. Paper Tape, 2x10 (in/yd) Discharge Instruction: Secure dressing with tape as directed. Compression Wrap Compression Stockings Add-Ons Electronic Signature(s) Signed: 02/22/2022 5:36:06 PM By: Levan Hurst RN, BSN Entered By: Levan Hurst on 02/22/2022  11:17:39 -------------------------------------------------------------------------------- Vitals Details Patient Name: Date of Service: [name] 02/22/2022 10:30 A M Medical Record Number: [id number] Patient Account Number: [id number] Date of Birth/Sex: Treating RN: Dec 13, 1950 (71 y.o. [name] Primary Care Undra Harriman: Claretta Fraise Other Clinician: Referring Angelee Bahr: Treating Leylany Nored/Extender: Lucienne Capers in Treatment: 3 Vital Signs Time Taken: 11:00 Temperature (F): 97.8 Height (in): 71 Pulse (bpm): 73 Weight (lbs): 180 Respiratory Rate (breaths/min): 20 Body Mass Index (BMI): 25.1 Blood Pressure (mmHg): 114/76 Reference Range: 80 - 120 mg / dl Electronic Signature(s) Signed: 02/22/2022 5:36:06 PM By: Levan Hurst RN, BSN Entered By: Levan Hurst on 02/22/2022 11:02:11

## 2022-02-22 NOTE — Progress Notes (Signed)
John [name] ([id number]) , Visit Report for 02/22/2022 Chief Complaint Document Details Patient Name: Date of Service: John [name] 02/22/2022 10:30 A M Medical Record Number: [id number] Patient Account Number: [id number] Date of Birth/Sex: Treating RN: 1951-04-27 (71 y.o. [name] Primary Care Provider: Claretta Fraise Other Clinician: Referring Provider: Treating Provider/Extender: Lucienne Capers in Treatment: 3 Information Obtained from: Patient Chief Complaint Patient presents for treatment of multiple open ulcers due to venous insufficiency and lymphedema Electronic Signature(s) Signed: 02/22/2022 11:34:02 AM By: Fredirick Maudlin MD FACS Entered By: Fredirick Maudlin on 02/22/2022 11:34:02 -------------------------------------------------------------------------------- Debridement Details Patient Name: Date of Service: John Spell L. 02/22/2022 10:30 A M Medical Record Number: [id number] Patient Account Number: [id number] Date of Birth/Sex: Treating RN: 1951/08/18 (71 y.o. [name] Primary Care Provider: Claretta Fraise Other Clinician: Referring Provider: Treating Provider/Extender: Lucienne Capers in Treatment: 3 Debridement Performed for Assessment: Wound #5 Right,Circumferential Lower Leg Performed By: Physician Fredirick Maudlin, MD Debridement Type: Debridement Severity of Tissue Pre Debridement: Fat layer exposed Level of Consciousness (Pre-procedure): Awake and Alert Pre-procedure Verification/Time Out Yes - 11:24 Taken: Start Time: 11:24 T Area Debrided (L x W): otal 4 (cm) x 4 (cm) = 16 (cm) Tissue and other material debrided: Non-Viable, Eschar, Slough, Slough Level: Non-Viable Tissue Debridement Description: Selective/Open Wound Instrument: Curette Bleeding: Minimum Hemostasis Achieved: Pressure End Time: 11:26 Procedural Pain: 0 Post Procedural Pain: 0 Response to Treatment:  Procedure was tolerated well Level of Consciousness (Post- Awake and Alert procedure): Post Debridement Measurements of Total Wound Length: (cm) 11.4 Width: (cm) 16.3 Depth: (cm) 0.1 Volume: (cm) 14.594 Character of Wound/Ulcer Post Debridement: Improved Severity of Tissue Post Debridement: Fat layer exposed Post Procedure Diagnosis Same as Pre-procedure Electronic Signature(s) Signed: 02/22/2022 11:37:33 AM By: Fredirick Maudlin MD FACS Signed: 02/22/2022 5:36:06 PM By: Levan Hurst RN, BSN Entered By: Levan Hurst on 02/22/2022 11:27:27 -------------------------------------------------------------------------------- Debridement Details Patient Name: Date of Service: John Spell L. 02/22/2022 10:30 A M Medical Record Number: [id number] Patient Account Number: [id number] Date of Birth/Sex: Treating RN: 10/02/1950 (71 y.o. [name] Primary Care Provider: Claretta Fraise Other Clinician: Referring Provider: Treating Provider/Extender: Lucienne Capers in Treatment: 3 Debridement Performed for Assessment: Wound #2 Left,Medial Lower Leg Performed By: Physician Fredirick Maudlin, MD Debridement Type: Debridement Severity of Tissue Pre Debridement: Fat layer exposed Level of Consciousness (Pre-procedure): Awake and Alert Pre-procedure Verification/Time Out Yes - 11:24 Taken: Start Time: 11:26 T Area Debrided (L x W): otal 2 (cm) x 3.5 (cm) = 7 (cm) Tissue and other material debrided: Non-Viable, Eschar, Slough, Slough Level: Non-Viable Tissue Debridement Description: Selective/Open Wound Instrument: Curette Bleeding: Minimum Hemostasis Achieved: Pressure End Time: 11:27 Procedural Pain: 0 Post Procedural Pain: 0 Response to Treatment: Procedure was tolerated well Level of Consciousness (Post- Awake and Alert procedure): Post Debridement Measurements of Total Wound Length: (cm) 2 Width: (cm) 3.5 Depth: (cm) 0.1 Volume: (cm)  0.55 Character of Wound/Ulcer Post Debridement: Improved Severity of Tissue Post Debridement: Fat layer exposed Post Procedure Diagnosis Same as Pre-procedure Electronic Signature(s) Signed: 02/22/2022 11:37:33 AM By: Fredirick Maudlin MD FACS Signed: 02/22/2022 5:36:06 PM By: Levan Hurst RN, BSN Entered By: Levan Hurst on 02/22/2022 11:28:04 -------------------------------------------------------------------------------- Debridement Details Patient Name: Date of Service: John Spell L. 02/22/2022 10:30 A M Medical Record Number: [id number] Patient Account Number: [id number] Date of Birth/Sex: Treating RN: 1951-01-19 (71 y.o. [name] Primary Care Provider: Claretta Fraise Other Clinician:  Referring Provider: Treating Provider/Extender: Lucienne Capers in Treatment: 3 Debridement Performed for Assessment: Wound #1 Left,Anterior Lower Leg Performed By: Physician Fredirick Maudlin, MD Debridement Type: Debridement Severity of Tissue Pre Debridement: Fat layer exposed Level of Consciousness (Pre-procedure): Awake and Alert Pre-procedure Verification/Time Out Yes - 11:24 Taken: Start Time: 11:27 T Area Debrided (L x W): otal 4.7 (cm) x 7 (cm) = 32.9 (cm) Tissue and other material debrided: Non-Viable, Eschar, Slough, Slough Level: Non-Viable Tissue Debridement Description: Selective/Open Wound Instrument: Curette Bleeding: Minimum Hemostasis Achieved: Pressure End Time: 11:28 Procedural Pain: 0 Post Procedural Pain: 0 Response to Treatment: Procedure was tolerated well Level of Consciousness (Post- Awake and Alert procedure): Post Debridement Measurements of Total Wound Length: (cm) 4.7 Width: (cm) 7 Depth: (cm) 0.1 Volume: (cm) 2.584 Character of Wound/Ulcer Post Debridement: Improved Severity of Tissue Post Debridement: Fat layer exposed Post Procedure Diagnosis Same as Pre-procedure Electronic Signature(s) Signed: 02/22/2022  11:37:33 AM By: Fredirick Maudlin MD FACS Signed: 02/22/2022 5:36:06 PM By: Levan Hurst RN, BSN Entered By: Levan Hurst on 02/22/2022 11:28:51 -------------------------------------------------------------------------------- HPI Details Patient Name: Date of Service: John Spell L. 02/22/2022 10:30 A M Medical Record Number: [id number] Patient Account Number: [id number] Date of Birth/Sex: Treating RN: Jan 17, 1951 (71 y.o. [name] Primary Care Provider: Claretta Fraise Other Clinician: Referring Provider: Treating Provider/Extender: Lucienne Capers in Treatment: 3 History of Present Illness HPI Description: ADMISSION 01/31/2022 This is a 71 year old man with severe systolic heart failure (ejection fraction roughly 20%) stage III chronic kidney disease, chronic diastolic dysfunction type 2 diabetes mellitus (last hemoglobin A1c 7.0% on November 17, 2021), lymphedema, hypertension, and chronic atrial fibrillation. He has had massive fluid shifts over the past 1 to 2 weeks. He is on torsemide and does have a protocol for increasing his dose if his weight fluctuates a certain number of pounds. Despite this, his legs have swollen tremendously and he has had multiple blisters open and breakdown bilaterally. He is here today for further evaluation of these wounds. On his right leg, he has nearly circumferential wounds. Some of these are limited to skin breakdown, while others have the fat layer exposed. There is slough accumulation in the deeper wounds. He has similar wounds, albeit less extensive, on the left. He says that his legs feel like they are burning. 02/08/2022: Over the past week, the patient has lost 13 pounds of fluid. His wife has been changing his dressings. She reports that they never received any of the calcium alginate that was ordered. She has just been using ABD pads and Kerlix. In spite of this, however, all of his wounds are a little bit smaller  and they have clean surfaces. There is just a little bit of eschar and slough accumulation on a couple of the open areas. He continues to complain of intense burning and tingling in his legs and feet. 02/15/2022: All of the wounds are smaller today. His wife did find the calcium alginate in her wound care supplies. There is still slough accumulation on all of the wound surfaces. There is a little bit of eschar adherent to a couple of the sites. He continues to complain of intense burning and tingling in his legs and feet; he has an appointment with his primary care provider next week to address his peripheral neuropathy. 02/22/2022: The wounds are smaller and cleaner today. There is still a little slough accumulation on all of the surfaces. He has not yet seen his primary care provider  regarding his neuropathy, but he did see cardiology today. He has been significantly more confused lately and they are going to go to the emergency room after our visit today. Electronic Signature(s) Signed: 02/22/2022 11:35:11 AM By: Fredirick Maudlin MD FACS Entered By: Fredirick Maudlin on 02/22/2022 11:35:11 -------------------------------------------------------------------------------- Physical Exam Details Patient Name: Date of Service: John Spell L. 02/22/2022 10:30 A M Medical Record Number: [id number] Patient Account Number: [id number] Date of Birth/Sex: Treating RN: [birth year] (70 y.o. [name] Primary Care Provider: Claretta Fraise Other Clinician: Referring Provider: Treating Provider/Extender: Lucienne Capers in Treatment: 3 Constitutional . . . . No acute distress.Marland Kitchen Respiratory Normal work of breathing on room air.. Notes 02/22/2022: The wounds are smaller and cleaner today. There is still a little slough accumulation on all of the surfaces. Electronic Signature(s) Signed: 02/22/2022 11:35:48 AM By: Fredirick Maudlin MD FACS Entered By: Fredirick Maudlin on  02/22/2022 11:35:48 -------------------------------------------------------------------------------- Physician Orders Details Patient Name: Date of Service: John Spell L. 02/22/2022 10:30 A M Medical Record Number: [id number] Patient Account Number: [id number] Date of Birth/Sex: Treating RN: 12-12-1950 (71 y.o. [name] Primary Care Provider: Claretta Fraise Other Clinician: Referring Provider: Treating Provider/Extender: Lucienne Capers in Treatment: 3 Verbal / Phone Orders: No Diagnosis Coding ICD-10 Coding Code Description X21.19 Chronic diastolic (congestive) heart failure Hypertensive heart and chronic kidney disease with heart failure and stage 1 through stage 4 chronic kidney disease, or unspecified I13.0 chronic kidney disease N18.30 Chronic kidney disease, stage 3 unspecified E11.9 Type 2 diabetes mellitus without complications E17.4 Lymphedema, not elsewhere classified L97.822 Non-pressure chronic ulcer of other part of left lower leg with fat layer exposed L97.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 L97.811 Non-pressure chronic ulcer of other part of right lower leg limited to breakdown of skin Follow-up Appointments ppointment in 1 week. - Dr. Celine Ahr - Room 2 - Thursday 6/22 at 8:30 am Return A Bathing/ Shower/ Hygiene May shower with protection but do not get wound dressing(s) wet. - Ok to use [employer], can buy at [facility], [facility], or [employer] Edema Control - Lymphedema / SCD / Other Elevate legs to the level of the heart or above for 30 minutes daily and/or when sitting, a frequency of: - throughout the day Avoid standing for long periods of time. Exercise regularly Wound Treatment Wound #1 - Lower Leg Wound Laterality: Left, Anterior Cleanser: Soap and Water Every Other Day/30  Days Discharge Instructions: May shower and wash wound with dial antibacterial soap and water prior to dressing change. Cleanser: Wound Cleanser Every Other Day/30 Days Discharge Instructions: Cleanse the wound with wound cleanser prior to applying a clean dressing using gauze sponges, not tissue or cotton balls. Prim Dressing: Maxorb Extra Calcium Alginate Dressing, 4x4 in Every Other Day/30 Days ary Discharge Instructions: Apply calcium alginate to wound bed as instructed Secondary Dressing: ABD Pad, 8x10 (Generic) Every Other Day/30 Days Discharge Instructions: Apply over primary dressing as directed. Secondary Dressing: Woven Gauze Sponge, Non-Sterile 4x4 in Every Other Day/30 Days Discharge Instructions: Apply over primary dressing as directed. Secured With: Elastic Bandage 4 inch (ACE bandage) (Generic) Every Other Day/30 Days Discharge Instructions: Secure with ACE bandage as directed. Secured With: [employer], 4.5x3.1 (in/yd) Every Other Day/30 Days Discharge Instructions: Secure with Kerlix as directed. Secured With: Paper Tape, 2x10 (in/yd) Every  Other Day/30 Days Discharge Instructions: Secure dressing with tape as directed. Secured With: [employer] Size 5, 10 (yds) (Generic) Every Other Day/30 Days Wound #2 - Lower Leg Wound Laterality: Left, Medial Cleanser: Soap and Water Every Other Day/30 Days Discharge Instructions: May shower and wash wound with dial antibacterial soap and water prior to dressing change. Cleanser: Wound Cleanser Every Other Day/30 Days Discharge Instructions: Cleanse the wound with wound cleanser prior to applying a clean dressing using gauze sponges, not tissue or cotton balls. Prim Dressing: Maxorb Extra Calcium Alginate Dressing, 4x4 in Every Other Day/30 Days ary Discharge Instructions: Apply calcium alginate to wound bed as instructed Secondary Dressing: ABD Pad, 5x9 Every Other Day/30 Days Discharge Instructions: Apply over primary  dressing as directed. Secondary Dressing: Woven Gauze Sponge, Non-Sterile 4x4 in Every Other Day/30 Days Discharge Instructions: Apply over primary dressing as directed. Secured With: Elastic Bandage 4 inch (ACE bandage) (Generic) Every Other Day/30 Days Discharge Instructions: Secure with ACE bandage as directed. Secured With: [employer], 4.5x3.1 (in/yd) Every Other Day/30 Days Discharge Instructions: Secure with Kerlix as directed. Secured With: Paper Tape, 2x10 (in/yd) Every Other Day/30 Days Discharge Instructions: Secure dressing with tape as directed. Wound #5 - Lower Leg Wound Laterality: Right, Circumferential Cleanser: Soap and Water Every Other Day/30 Days Discharge Instructions: May shower and wash wound with dial antibacterial soap and water prior to dressing change. Cleanser: Wound Cleanser Every Other Day/30 Days Discharge Instructions: Cleanse the wound with wound cleanser prior to applying a clean dressing using gauze sponges, not tissue or cotton balls. Prim Dressing: Maxorb Extra Calcium Alginate Dressing, 4x4 in ary Every Other Day/30 Days Discharge Instructions: Apply calcium alginate to wound bed as instructed Secondary Dressing: ABD Pad, 8x10 (Generic) Every Other Day/30 Days Discharge Instructions: Apply over primary dressing as directed. Secondary Dressing: Woven Gauze Sponge, Non-Sterile 4x4 in Every Other Day/30 Days Discharge Instructions: Apply over primary dressing as directed. Secured With: Elastic Bandage 4 inch (ACE bandage) (Generic) Every Other Day/30 Days Discharge Instructions: Secure with ACE bandage as directed. Secured With: [employer], 4.5x3.1 (in/yd) Every Other Day/30 Days Discharge Instructions: Secure with Kerlix as directed. Secured With: Paper Tape, 2x10 (in/yd) Every Other Day/30 Days Discharge Instructions: Secure dressing with tape as directed. Electronic Signature(s) Signed: 02/22/2022 11:37:33 AM By: Fredirick Maudlin MD  FACS Entered By: Fredirick Maudlin on 02/22/2022 11:36:10 -------------------------------------------------------------------------------- Problem List Details Patient Name: Date of Service: John Spell L. 02/22/2022 10:30 A M Medical Record Number: [id number] Patient Account Number: [id number] Date of Birth/Sex: Treating RN: [birth year] (71 y.o. [name] Primary Care Provider: Claretta Fraise Other Clinician: Referring Provider: Treating Provider/Extender: Lucienne Capers in Treatment: 3 Active Problems ICD-10 Encounter Code Description Active Date MDM Diagnosis D14.97 Chronic diastolic (congestive) heart failure 01/31/2022 No Yes I13.0 Hypertensive heart and chronic kidney disease with heart failure and stage 1 01/31/2022 No Yes through stage 4 chronic kidney disease, or unspecified chronic kidney disease N18.30 Chronic kidney disease, stage 3 unspecified 01/31/2022 No Yes E11.9 Type 2 diabetes mellitus without complications 0/26/3785 No Yes I89.0 Lymphedema, not elsewhere classified 01/31/2022 No Yes L97.822 Non-pressure chronic ulcer of other part of left lower leg with fat layer exposed5/24/2023 No Yes L97.821 Non-pressure chronic ulcer of other part of left lower leg limited to breakdown 01/31/2022 No Yes of skin L97.812 Non-pressure chronic ulcer of other part of right lower leg with fat layer 01/31/2022 No Yes exposed L97.811 Non-pressure chronic ulcer of other part of right  lower leg limited to breakdown 01/31/2022 No Yes of skin Inactive Problems Resolved Problems Electronic Signature(s) Signed: 02/22/2022 11:33:47 AM By: Fredirick Maudlin MD FACS Entered By: Fredirick Maudlin on 02/22/2022 11:33:47 -------------------------------------------------------------------------------- Progress Note Details Patient Name: Date of Service: John Spell L. 02/22/2022 10:30 A M Medical Record Number: [id number] Patient Account Number:  [id number] Date of Birth/Sex: Treating RN: 09-Nov-1950 (71 y.o. [name] Primary Care Provider: Claretta Fraise Other Clinician: Referring Provider: Treating Provider/Extender: Lucienne Capers in Treatment: 3 Subjective Chief Complaint Information obtained from Patient Patient presents for treatment of multiple open ulcers due to venous insufficiency and lymphedema History of Present Illness (HPI) ADMISSION 01/31/2022 This is a 72 year old man with severe systolic heart failure (ejection fraction roughly 20%) stage III chronic kidney disease, chronic diastolic dysfunction type 2 diabetes mellitus (last hemoglobin A1c 7.0% on November 17, 2021), lymphedema, hypertension, and chronic atrial fibrillation. He has had massive fluid shifts over the past 1 to 2 weeks. He is on torsemide and does have a protocol for increasing his dose if his weight fluctuates a certain number of pounds. Despite this, his legs have swollen tremendously and he has had multiple blisters open and breakdown bilaterally. He is here today for further evaluation of these wounds. On his right leg, he has nearly circumferential wounds. Some of these are limited to skin breakdown, while others have the fat layer exposed. There is slough accumulation in the deeper wounds. He has similar wounds, albeit less extensive, on the left. He says that his legs feel like they are burning. 02/08/2022: Over the past week, the patient has lost 13 pounds of fluid. His wife has been changing his dressings. She reports that they never received any of the calcium alginate that was ordered. She has just been using ABD pads and Kerlix. In spite of this, however, all of his wounds are a little bit smaller and they have clean surfaces. There is just a little bit of eschar and slough accumulation on a couple of the open areas. He continues to complain of intense burning and tingling in his legs and feet. 02/15/2022: All of the  wounds are smaller today. His wife did find the calcium alginate in her wound care supplies. There is still slough accumulation on all of the wound surfaces. There is a little bit of eschar adherent to a couple of the sites. He continues to complain of intense burning and tingling in his legs and feet; he has an appointment with his primary care provider next week to address his peripheral neuropathy. 02/22/2022: The wounds are smaller and cleaner today. There is still a little slough accumulation on all of the surfaces. He has not yet seen his primary care provider regarding his neuropathy, but he did see cardiology today. He has been significantly more confused lately and they are going to go to the emergency room after our visit today. Patient History Information obtained from Patient. Family History Unknown History. Social History Former smoker, Marital Status - Married, Alcohol Use - Never, Drug Use - No History, Caffeine Use - Moderate. Medical History Eyes Patient has history of Glaucoma Hematologic/Lymphatic Patient has history of Anemia Cardiovascular Patient has history of Arrhythmia - A-Fib, Congestive Heart Failure, Coronary Artery Disease, Hypertension, Peripheral Venous Disease Endocrine Patient has history of Type II Diabetes Musculoskeletal Patient has history of Osteoarthritis Medical A Surgical History Notes nd Cardiovascular Rheumatic heart disease, Mitral Valve Replacement Objective Constitutional No acute distress.. Vitals Time Taken: 11:00 AM,  Height: 71 in, Weight: 180 lbs, BMI: 25.1, Temperature: 97.8 F, Pulse: 73 bpm, Respiratory Rate: 20 breaths/min, Blood Pressure: 114/76 mmHg. Respiratory Normal work of breathing on room air.. General Notes: 02/22/2022: The wounds are smaller and cleaner today. There is still a little slough accumulation on all of the surfaces. Integumentary (Hair, Skin) Wound #1 status is Open. Original cause of wound was Blister. The  date acquired was: 01/24/2022. The wound has been in treatment 3 weeks. The wound is located on the Left,Anterior Lower Leg. The wound measures 4.7cm length x 7cm width x 0.1cm depth; 25.84cm^2 area and 2.584cm^3 volume. There is Fat Layer (Subcutaneous Tissue) exposed. There is no tunneling or undermining noted. There is a medium amount of serosanguineous drainage noted. The wound margin is flat and intact. There is large (67-100%) red, pink granulation within the wound bed. There is a small (1-33%) amount of necrotic tissue within the wound bed including Adherent Slough. Wound #2 status is Open. Original cause of wound was Blister. The date acquired was: 01/24/2022. The wound has been in treatment 3 weeks. The wound is located on the Left,Medial Lower Leg. The wound measures 2cm length x 3.5cm width x 0.1cm depth; 5.498cm^2 area and 0.55cm^3 volume. There is Fat Layer (Subcutaneous Tissue) exposed. There is no tunneling or undermining noted. There is a medium amount of serosanguineous drainage noted. The wound margin is flat and intact. There is large (67-100%) red, pink granulation within the wound bed. There is a small (1-33%) amount of necrotic tissue within the wound bed including Adherent Slough. Wound #3 status is Open. Original cause of wound was Blister. The date acquired was: 01/24/2022. The wound has been in treatment 3 weeks. The wound is located on the Left,Proximal,Posterior Lower Leg. The wound measures 0cm length x 0cm width x 0cm depth; 0cm^2 area and 0cm^3 volume. There is no tunneling or undermining noted. There is a none present amount of drainage noted. The wound margin is flat and intact. There is no granulation within the wound bed. There is no necrotic tissue within the wound bed. Wound #5 status is Open. Original cause of wound was Blister. The date acquired was: 01/24/2022. The wound has been in treatment 3 weeks. The wound is located on the Right,Circumferential Lower Leg. The  wound measures 11.4cm length x 16.3cm width x 0.1cm depth; 145.943cm^2 area and 14.594cm^3 volume. There is Fat Layer (Subcutaneous Tissue) exposed. There is no tunneling or undermining noted. There is a large amount of serosanguineous drainage noted. The wound margin is flat and intact. There is large (67-100%) red, pink granulation within the wound bed. There is a small (1-33%) amount of necrotic tissue within the wound bed including Adherent Slough. Assessment Active Problems ZOX-09 Chronic diastolic (congestive) heart failure Hypertensive heart and chronic kidney disease with heart failure and stage 1 through stage 4 chronic kidney disease, or unspecified chronic kidney disease Chronic kidney disease, stage 3 unspecified Type 2 diabetes mellitus without complications Lymphedema, not elsewhere classified Non-pressure chronic ulcer of other part of left lower leg with fat layer exposed Non-pressure chronic ulcer of other part of left lower leg limited to breakdown of skin Non-pressure chronic ulcer of other part of right lower leg with fat layer exposed Non-pressure chronic ulcer of other part of right lower leg limited to breakdown of skin Procedures Wound #1 Pre-procedure diagnosis of Wound #1 is a Venous Leg Ulcer located on the Left,Anterior Lower Leg .Severity of Tissue Pre Debridement is: Fat layer exposed. There was  a Selective/Open Wound Non-Viable Tissue Debridement with a total area of 32.9 sq cm performed by Fredirick Maudlin, MD. With the following instrument(s): Curette to remove Non-Viable tissue/material. Material removed includes Eschar and Slough and. No specimens were taken. A time out was conducted at 11:24, prior to the start of the procedure. A Minimum amount of bleeding was controlled with Pressure. The procedure was tolerated well with a pain level of 0 throughout and a pain level of 0 following the procedure. Post Debridement Measurements: 4.7cm length x 7cm width x  0.1cm depth; 2.584cm^3 volume. Character of Wound/Ulcer Post Debridement is improved. Severity of Tissue Post Debridement is: Fat layer exposed. Post procedure Diagnosis Wound #1: Same as Pre-Procedure Wound #2 Pre-procedure diagnosis of Wound #2 is a Venous Leg Ulcer located on the Left,Medial Lower Leg .Severity of Tissue Pre Debridement is: Fat layer exposed. There was a Selective/Open Wound Non-Viable Tissue Debridement with a total area of 7 sq cm performed by Fredirick Maudlin, MD. With the following instrument(s): Curette to remove Non-Viable tissue/material. Material removed includes Eschar and Slough and. No specimens were taken. A time out was conducted at 11:24, prior to the start of the procedure. A Minimum amount of bleeding was controlled with Pressure. The procedure was tolerated well with a pain level of 0 throughout and a pain level of 0 following the procedure. Post Debridement Measurements: 2cm length x 3.5cm width x 0.1cm depth; 0.55cm^3 volume. Character of Wound/Ulcer Post Debridement is improved. Severity of Tissue Post Debridement is: Fat layer exposed. Post procedure Diagnosis Wound #2: Same as Pre-Procedure Wound #5 Pre-procedure diagnosis of Wound #5 is a Venous Leg Ulcer located on the Right,Circumferential Lower Leg .Severity of Tissue Pre Debridement is: Fat layer exposed. There was a Selective/Open Wound Non-Viable Tissue Debridement with a total area of 16 sq cm performed by Fredirick Maudlin, MD. With the following instrument(s): Curette to remove Non-Viable tissue/material. Material removed includes Eschar and Slough and. No specimens were taken. A time out was conducted at 11:24, prior to the start of the procedure. A Minimum amount of bleeding was controlled with Pressure. The procedure was tolerated well with a pain level of 0 throughout and a pain level of 0 following the procedure. Post Debridement Measurements: 11.4cm length x 16.3cm width x 0.1cm  depth; 14.594cm^3 volume. Character of Wound/Ulcer Post Debridement is improved. Severity of Tissue Post Debridement is: Fat layer exposed. Post procedure Diagnosis Wound #5: Same as Pre-Procedure Plan Follow-up Appointments: Return Appointment in 1 week. - Dr. Celine Ahr - Room 2 - Thursday 6/22 at 8:30 am Bathing/ Shower/ Hygiene: May shower with protection but do not get wound dressing(s) wet. - Ok to use Biomedical [employer], can buy at [facility], [facility], or [employer] Edema Control - Lymphedema / SCD / Other: Elevate legs to the level of the heart or above for 30 minutes daily and/or when sitting, a frequency of: - throughout the day Avoid standing for long periods of time. Exercise regularly WOUND #1: - Lower Leg Wound Laterality: Left, Anterior Cleanser: Soap and Water Every Other Day/30 Days Discharge Instructions: May shower and wash wound with dial antibacterial soap and water prior to dressing change. Cleanser: Wound Cleanser Every Other Day/30 Days Discharge Instructions: Cleanse the wound with wound cleanser prior to applying a clean dressing using gauze sponges, not tissue or cotton balls. Prim Dressing: Maxorb Extra Calcium Alginate Dressing, 4x4 in Every Other Day/30 Days ary Discharge Instructions: Apply calcium alginate to wound bed as instructed Secondary Dressing: ABD Pad, 8x10 (  Generic) Every Other Day/30 Days Discharge Instructions: Apply over primary dressing as directed. Secondary Dressing: Woven Gauze Sponge, Non-Sterile 4x4 in Every Other Day/30 Days Discharge Instructions: Apply over primary dressing as directed. Secured With: Elastic Bandage 4 inch (ACE bandage) (Generic) Every Other Day/30 Days Discharge Instructions: Secure with ACE bandage as directed. Secured With: [employer], 4.5x3.1 (in/yd) Every Other Day/30 Days Discharge Instructions: Secure with Kerlix as directed. Secured With: Paper T ape, 2x10 (in/yd) Every Other Day/30 Days Discharge Instructions:  Secure dressing with tape as directed. Secured With: [employer] Size 5, 10 (yds) (Generic) Every Other Day/30 Days WOUND #2: - Lower Leg Wound Laterality: Left, Medial Cleanser: Soap and Water Every Other Day/30 Days Discharge Instructions: May shower and wash wound with dial antibacterial soap and water prior to dressing change. Cleanser: Wound Cleanser Every Other Day/30 Days Discharge Instructions: Cleanse the wound with wound cleanser prior to applying a clean dressing using gauze sponges, not tissue or cotton balls. Prim Dressing: Maxorb Extra Calcium Alginate Dressing, 4x4 in Every Other Day/30 Days ary Discharge Instructions: Apply calcium alginate to wound bed as instructed Secondary Dressing: ABD Pad, 5x9 Every Other Day/30 Days Discharge Instructions: Apply over primary dressing as directed. Secondary Dressing: Woven Gauze Sponge, Non-Sterile 4x4 in Every Other Day/30 Days Discharge Instructions: Apply over primary dressing as directed. Secured With: Elastic Bandage 4 inch (ACE bandage) (Generic) Every Other Day/30 Days Discharge Instructions: Secure with ACE bandage as directed. Secured With: [employer], 4.5x3.1 (in/yd) Every Other Day/30 Days Discharge Instructions: Secure with Kerlix as directed. Secured With: Paper T ape, 2x10 (in/yd) Every Other Day/30 Days Discharge Instructions: Secure dressing with tape as directed. WOUND #5: - Lower Leg Wound Laterality: Right, Circumferential Cleanser: Soap and Water Every Other Day/30 Days Discharge Instructions: May shower and wash wound with dial antibacterial soap and water prior to dressing change. Cleanser: Wound Cleanser Every Other Day/30 Days Discharge Instructions: Cleanse the wound with wound cleanser prior to applying a clean dressing using gauze sponges, not tissue or cotton balls. Prim Dressing: Maxorb Extra Calcium Alginate Dressing, 4x4 in Every Other Day/30 Days ary Discharge Instructions: Apply calcium  alginate to wound bed as instructed Secondary Dressing: ABD Pad, 8x10 (Generic) Every Other Day/30 Days Discharge Instructions: Apply over primary dressing as directed. Secondary Dressing: Woven Gauze Sponge, Non-Sterile 4x4 in Every Other Day/30 Days Discharge Instructions: Apply over primary dressing as directed. Secured With: Elastic Bandage 4 inch (ACE bandage) (Generic) Every Other Day/30 Days Discharge Instructions: Secure with ACE bandage as directed. Secured With: [employer], 4.5x3.1 (in/yd) Every Other Day/30 Days Discharge Instructions: Secure with Kerlix as directed. Secured With: Paper T ape, 2x10 (in/yd) Every Other Day/30 Days Discharge Instructions: Secure dressing with tape as directed. 02/22/2022: The wounds are smaller and cleaner today. There is still a little slough accumulation on all of the surfaces. I used a curette to debride the slough off of the wound surfaces. We will continue using calcium alginate with Kerlix and Ace wrap. Follow-up in 1 week. Electronic Signature(s) Signed: 02/22/2022 11:36:37 AM By: Fredirick Maudlin MD FACS Entered By: Fredirick Maudlin on 02/22/2022 11:36:37 -------------------------------------------------------------------------------- HxROS Details Patient Name: Date of Service: John Spell L. 02/22/2022 10:30 A M Medical Record Number: [id number] Patient Account Number: [id number] Date of Birth/Sex: Treating RN: June 26, 1951 (71 y.o. [name] Primary Care Provider: Claretta Fraise Other Clinician: Referring Provider: Treating Provider/Extender: Lucienne Capers in Treatment: 3 Information Obtained From Patient Eyes Medical History: Positive  for: Glaucoma Hematologic/Lymphatic Medical History: Positive for: Anemia Cardiovascular Medical History: Positive for: Arrhythmia - A-Fib; Congestive Heart Failure; Coronary Artery Disease; Hypertension; Peripheral Venous Disease Past Medical History  Notes: Rheumatic heart disease, Mitral Valve Replacement Endocrine Medical History: Positive for: Type II Diabetes Treated with: Insulin, Oral agents Blood sugar tested every day: Yes Tested : twice a day Musculoskeletal Medical History: Positive for: Osteoarthritis HBO Extended History Items Eyes: Glaucoma Immunizations Pneumococcal Vaccine: Received Pneumococcal Vaccination: Yes Received Pneumococcal Vaccination On or After 60th Birthday: No Implantable Devices None Family and Social History Unknown History: Yes; Former smoker; Marital Status - Married; Alcohol Use: Never; Drug Use: No History; Caffeine Use: Moderate; Financial Concerns: No; Food, Clothing or Shelter Needs: No; Support System Lacking: No; Transportation Concerns: No Electronic Signature(s) Signed: 02/22/2022 11:37:33 AM By: Fredirick Maudlin MD FACS Signed: 02/22/2022 5:36:06 PM By: Levan Hurst RN, BSN Entered By: Fredirick Maudlin on 02/22/2022 11:35:18 -------------------------------------------------------------------------------- SuperBill Details Patient Name: Date of Service: John [name]. 02/22/2022 Medical Record Number: [id number] Patient Account Number: [id number] Date of Birth/Sex: Treating RN: 28-Dec-1950 (71 y.o. [name] Primary Care Provider: Claretta Fraise Other Clinician: Referring Provider: Treating Provider/Extender: Lucienne Capers in Treatment: 3 Diagnosis Coding ICD-10 Codes Code Description G66.59 Chronic diastolic (congestive) heart failure Hypertensive heart and chronic kidney disease with heart failure and stage 1 through stage 4 chronic kidney disease, or unspecified I13.0 chronic kidney disease N18.30 Chronic kidney disease, stage 3 unspecified E11.9 Type 2 diabetes mellitus without complications D35.7 Lymphedema, not elsewhere classified L97.822 Non-pressure chronic ulcer of other part of left lower leg with fat layer exposed L97.821  Non-pressure chronic ulcer of other part of left lower leg limited to breakdown of skin L97.812 Non-pressure chronic ulcer of other part of right lower leg with fat layer exposed L97.811 Non-pressure chronic ulcer of other part of right lower leg limited to breakdown of skin Facility Procedures CPT4 Code: [id number] Description: 30092 - DEBRIDE WOUND 1ST 20 SQ CM OR < ICD-10 Diagnosis Description L97.822 Non-pressure chronic ulcer of other part of left lower leg with fat layer exposed L97.821 Non-pressure chronic ulcer of other part of left lower leg limited to  breakdown o L97.812 Non-pressure chronic ulcer of other part of right lower leg with fat layer expose L97.811 Non-pressure chronic ulcer of other part of right lower leg limited to breakdown Modifier: f skin d of skin Quantity: 1 CPT4 Code: [id number] Description: 63335 - DEBRIDE WOUND EA ADDL 20 SQ CM ICD-10 Diagnosis Description L97.822 Non-pressure chronic ulcer of other part of left lower leg with fat layer exposed L97.821 Non-pressure chronic ulcer of other part of left lower leg limited to  breakdown o L97.812 Non-pressure chronic ulcer of other part of right lower leg with fat layer expose L97.811 Non-pressure chronic ulcer of other part of right lower leg limited to breakdown Modifier: f skin d of skin Quantity: 2 Physician Procedures : CPT4 Code Description Modifier [id number] - WC PHYS LEVEL 3 - EST PT 25 ICD-10 Diagnosis Description L97.822 Non-pressure chronic ulcer of other part of left lower leg with fat layer exposed L97.821 Non-pressure chronic ulcer of other part of left  lower leg limited to breakdown of skin L97.812 Non-pressure chronic ulcer of other part of right lower leg with fat layer exposed L97.811 Non-pressure chronic ulcer of other part of right lower leg limited to breakdown of skin [id number] - WC PHYS  DEBR WO ANESTH 20 SQ CM 1 ICD-10 Diagnosis Description L97.822  Non-pressure chronic ulcer of other part of  left lower leg with fat layer exposed L97.821 Non-pressure chronic ulcer of other part of left lower leg limited to breakdown of skin L97.812  Non-pressure chronic ulcer of other part of right lower leg with fat layer exposed L97.811 Non-pressure chronic ulcer of other part of right lower leg limited to breakdown of skin Quantity: 1 : [id number] - WC PHYS DEBR WO ANESTH EA ADD 20 CM 2 ICD-10 Diagnosis Description L97.822 Non-pressure chronic ulcer of other part of left lower leg with fat layer exposed L97.821 Non-pressure chronic ulcer of other part of left lower leg limited to  breakdown of skin L97.812 Non-pressure chronic ulcer of other part of right lower leg with fat layer exposed L97.811 Non-pressure chronic ulcer of other part of right lower leg limited to breakdown of skin Quantity: Electronic Signature(s) Signed: 02/22/2022 11:37:11 AM By: Fredirick Maudlin MD FACS Entered By: Fredirick Maudlin on 02/22/2022 11:37:11

## 2022-02-22 NOTE — ED Notes (Signed)
Wife brought meal for patient,

## 2022-02-22 NOTE — H&P (Signed)
History and Physical  John [name] MGQ:[id number] DOB: 08/08/1951 DOA: 02/22/2022  Referring physician: Accepted by Dr. Avon Gully, [facility]  PCP: Claretta Fraise, MD  Outpatient Specialists: Cardiology, Oncology Patient coming from: Home through [place] ED  Chief Complaint: AMS   HPI: John [name] is a 71 y.o. male with medical history significant for HFrEF 20 to 25%, essential hypertension, coronary artery disease status post CABG x2 in 2002, mechanical mitral valve replacement in 2002 on Coumadin, St. Jude ICD, aortic valve stenosis, rheumatic fever, GERD, CKD 3B, permanent atrial fibrillation, hyperlipidemia, type 2 diabetes, who initially presented to his cardiologist for a routine follow-up related to heart failure and hypotension, he was found to be in acute confusion.  Per his wife, he has been confused for the past 3 weeks and it is progressive.  They were advised to go to the ED for further evaluation.  Prior to going to the ED the patient was taken by his wife to his wound care specialist for chronic bilateral lower extremity cellulitis, POA.    Work-up in the ED revealed suspected small chronic cortical/subcortical infarct within the high right parietal lobe, new from the prior head CT of 09/29/2011 (MCA/ACA watershed territory).  The patient was accepted by Dr. Avon Gully, [facility] and transferred to [facility] for further evaluation and management of his present condition.  ED Course: Tmax 97.5.  BP 97/63, pulse 87, respiration rate 18, saturation 97% on 2 L.  Lab studies remarkable for serum potassium 3.3, glucose 231, BUN 129, creatinine 4.04, anion gap 17, serum bicarb 25, alkaline phosphatase 323, albumin 3.4, magnesium 2.5.  Total bilirubin 2.1.  GFR 15.  BNP 3004.  Troponin 54, 61.  Review of Systems: Review of systems as noted in the HPI. All other systems reviewed and are negative.   Past Medical History:  Diagnosis Date   Abnormal nuclear stress test 05/10/2017    Allergy    Anemia    "lost blood w/the cancer"   Arthritis    back    Atrial fibrillation (HCC)    takes Coumadin daily   Bruises easily    takes Coumadin daily   CAD (coronary artery disease)    a. s/p CABG 11/2000 (L-LAD, S-RCA);  b. Lexiscan Myoview 11/12: EF 45%, ischemia and possibly some scar at the base of the inferolateral wall;   c. 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 s/p mechanical (St. Jude) MVR 2002;  b. Echo 5/11: Mild LVH, EF 45-50%, mild AS, mild AI, mean gradient 11 mmHg, MVR with normal  gradients, moderate LAE, mild RAE;  c.  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   SOB (shortness of breath)    with exertion   Type II diabetes mellitus ([place])    takes Januvia,Glipizide,and Metformin daily as well as Lantus   Unstable angina ([place]) 06/21/2020   Past Surgical History:  Procedure Laterality Date   APPLICATION OF WOUND VAC  2014   CARDIAC CATHETERIZATION  2002   COLONOSCOPY     CORONARY ARTERY BYPASS GRAFT  2002   x 3   CORONARY ARTERY BYPASS GRAFT     CORONARY ATHERECTOMY N/A 06/24/2020    Procedure: CORONARY ATHERECTOMY;  Surgeon: [name], MD;  Location: [place] CV LAB;  Service: Cardiovascular;  Laterality: N/A;   CORONARY BALLOON ANGIOPLASTY N/A 06/24/2020   Procedure: CORONARY BALLOON ANGIOPLASTY;  Surgeon: [name], MD;  Location: [place] CV LAB;  Service: Cardiovascular;  Laterality: N/A;   CORONARY STENT INTERVENTION N/A 06/24/2020   Procedure: CORONARY STENT INTERVENTION;  Surgeon: [name], MD;  Location: [place] CV LAB;  Service: Cardiovascular;  Laterality: N/A;   ESOPHAGOGASTRODUODENOSCOPY     HERNIA REPAIR     ICD IMPLANT N/A 02/19/2020   Procedure: ICD IMPLANT;  Surgeon: Evans Lance, MD;  Location: [place] CV LAB;  Service: Cardiovascular;  Laterality: N/A;   INCISIONAL HERNIA REPAIR N/A 03/15/2014   Procedure: LAPAROSCOPIC INCISIONAL HERNIA;  Surgeon: Harl Bowie, MD;  Location: [place];  Service: General;  Laterality: N/A;   INSERTION OF MESH N/A 03/15/2014   Procedure: INSERTION OF MESH;  Surgeon: Harl Bowie, MD;  Location: [place];  Service: General;  Laterality: N/A;   LAPAROSCOPIC INCISIONAL / UMBILICAL / [place]  03/15/2014   IHR   LAPAROTOMY N/A 02/09/2013   Procedure: EXPLORATORY LAPAROTOMY WITH incisional biopsy intra- ABDOMINAL MASS ILOCECTOMY ;  Surgeon: Harl Bowie, MD;  Location: [facility];  Service: General;  Laterality: N/A;   MITRAL VALVE REPLACEMENT  11/2000   #33 St. Jude mechanical mitral valve prosthesis. Dr. Servando Snare   [facility] REMOVAL  03/15/2014   PORT-A-CATH REMOVAL Left 03/15/2014   Procedure: REMOVAL PORT-A-CATH;  Surgeon: Harl Bowie, MD;  Location: [place];  Service: General;  Laterality: Left;   PORTACATH PLACEMENT N/A 02/09/2013   Procedure: INSERTION PORT-A-CATH;  Surgeon: Harl Bowie, MD;  Location: [facility];  Service: General;  Laterality: N/A;   RIGHT AND LEFT HEART CATH N/A 11/27/2021   Procedure: RIGHT AND LEFT HEART CATH;  Surgeon: Larey Dresser, MD;   Location: [place] CV LAB;  Service: Cardiovascular;  Laterality: N/A;   RIGHT/LEFT HEART CATH AND CORONARY/GRAFT ANGIOGRAPHY N/A 05/10/2017   Procedure: RIGHT/LEFT HEART CATH AND CORONARY/GRAFT ANGIOGRAPHY;  Surgeon: Martinique, Peter M, MD;  Location: [place] CV LAB;  Service: Cardiovascular;  Laterality: N/A;   RIGHT/LEFT HEART CATH AND CORONARY/GRAFT ANGIOGRAPHY N/A 06/23/2020   Procedure: RIGHT/LEFT HEART CATH AND CORONARY/GRAFT ANGIOGRAPHY;  Surgeon: Troy Sine, MD;  Location: [place] [facility];  Service: Cardiovascular;  Laterality: N/A;   SEPTOPLASTY     SMALL INTESTINE SURGERY     TEE WITHOUT CARDIOVERSION N/A 11/21/2021   Procedure: TRANSESOPHAGEAL ECHOCARDIOGRAM (TEE);  Surgeon: Larey Dresser, MD;  Location: [facility] ENDOSCOPY;  Service: Cardiovascular;  Laterality: N/A;    Social History:  reports that  he has quit smoking. His smoking use included cigars. He has never used smokeless tobacco. He reports that he does not drink alcohol and does not use drugs.   Allergies  Allergen Reactions   Allopurinol Shortness Of Breath   Doxycycline Hives   Farxiga [Dapagliflozin]     Can not tolerate - dizzy weak almost passed out    Family History  Problem Relation Age of Onset   Diabetes Mother    Hypertension Father    Thyroid disease Sister    Diabetes Brother    Arthritis Brother    Depression Brother    Heart disease Brother    Hyperlipidemia Brother    Hypertension Brother    Hypertension Brother    Diabetes Brother    Heart disease Brother    Hyperlipidemia Brother       Prior to Admission medications   Medication Sig Start Date End Date Taking? Authorizing Provider  clopidogrel (PLAVIX) 75 MG tablet TAKE 1 TABLET BY MOUTH DAILY WITH BREAKFAST. 04/17/21  Yes Furth, Cadence H, PA-C  glipiZIDE (GLUCOTROL XL) 10 MG 24 hr tablet Take 1 tablet (10 mg total) by mouth daily. 06/09/21  Yes Stacks, Cletus Gash, MD  insulin glargine (LANTUS) 100 UNIT/ML injection Inject 0.7 mLs  (70 Units total) into the skin at bedtime. Patient taking differently: Inject 70 Units into the skin. Take 70 units if over 150 daily 07/01/20  Yes Furth, Cadence H, PA-C  JANUVIA 50 MG tablet Take 0.5 tablets (25 mg total) by mouth daily. 12/02/21  Yes Kroeger, Daleen Snook M., PA-C  pantoprazole (PROTONIX) 40 MG tablet Take 1 tablet (40 mg total) by mouth daily. 06/09/21  Yes Stacks, Cletus Gash, MD  rosuvastatin (CRESTOR) 20 MG tablet 20 mg. Patient takes 1 tablet by mouth every other day.   Yes [provider]  sodium bicarbonate 650 MG tablet Take 650 mg by mouth 2 (two) times daily.   Yes [provider]  torsemide (DEMADEX) 20 MG tablet Take 40 mg by mouth 2 (two) times daily. If weight increases, increase to 60 mg bid   Yes [provider]  warfarin (COUMADIN) 5 MG tablet Take 1 tablet (5 mg total) by mouth daily. Patient taking differently: Take 5 mg by mouth daily. Alternates 2.5 x2 days then '5mg'$  alternating 04/03/21  Yes Claretta Fraise, MD  FUROSCIX 80 MG/10ML CTKT Inject into the skin. Patient not taking: Reported on 02/22/2022 01/24/22   [provider]  nitroGLYCERIN (NITROSTAT) 0.4 MG SL tablet Place 1 tablet (0.4 mg total) under the tongue every 5 (five) minutes as needed for chest pain. 01/15/22   Minus Breeding, MD  OneTouch Delica Lancets 99J MISC TEST 2 TIMES DAILY AS DIRECTED 02/24/21   Claretta Fraise, MD  [facility] VERIO test strip USE TO TEST TWICE A DAY 10/27/21   Claretta Fraise, MD    Physical Exam: BP 97/63 (BP Location: Left Arm)   Pulse 95   Temp (!) 96.9 F (36.1 C) (Axillary)   Resp 18   Ht '5\' 11"'$  (1.803 m)   Wt 84.6 kg   SpO2 97%   BMI 26.01 kg/m   General: 71 y.o. year-old male well developed well nourished in no acute distress.  Alert and oriented x2. Cardiovascular: Irregular rate and rhythm with no rubs or gallops.  No thyromegaly or JVD noted.  Lower extremities are wrapped with surgical dressing. Respiratory: Clear to auscultation  with no wheezes or rales. Good inspiratory effort. Abdomen: Soft nontender nondistended with normal bowel sounds  x4 quadrants. Muskuloskeletal: No cyanosis or clubbing. Neuro: CN II-XII intact, strength, sensation, reflexes Skin: No ulcerative lesions noted or rashes Psychiatry: Judgement and insight appear altered. Mood is appropriate for condition and setting          Labs on Admission:  Basic Metabolic Panel: Recent Labs  Lab 02/19/22 0848 02/22/22 1427 02/22/22 1633  NA 135 135  --   K 3.7 3.3*  --   CL 94* 93*  --   CO2 24 25  --   GLUCOSE 208* 231*  --   BUN 134* 129*  --   CREATININE 3.37* 4.04*  --   CALCIUM 9.1 9.7  --   MG  --   --  2.5*   Liver Function Tests: Recent Labs  Lab 02/22/22 1427  AST 27  ALT 13  ALKPHOS 323*  BILITOT 2.1*  PROT 6.6  ALBUMIN 3.4*   No results for input(s): "LIPASE", "AMYLASE" in the last 168 hours. No results for input(s): "AMMONIA" in the last 168 hours. CBC: Recent Labs  Lab 02/22/22 1427  WBC 4.6  HGB 12.6*  HCT 40.1  MCV 86.2  PLT 170   Cardiac Enzymes: No results for input(s): "CKTOTAL", "CKMB", "CKMBINDEX", "TROPONINI" in the last 168 hours.  BNP (last 3 results) Recent Labs    01/23/22 1518 01/31/22 1138 02/22/22 1426  BNP >4,500.0* 2,936.0* 3,004.6*    ProBNP (last 3 results) No results for input(s): "PROBNP" in the last 8760 hours.  CBG: Recent Labs  Lab 02/22/22 1856 02/22/22 2341  GLUCAP 190* 202*    Radiological Exams on Admission: CT Head Wo Contrast  Result Date: 02/22/2022 CLINICAL DATA:  Provided history: Mental status change, unknown cause. EXAM: CT HEAD WITHOUT CONTRAST TECHNIQUE: Contiguous axial images were obtained from the base of the skull through the vertex without intravenous contrast. RADIATION DOSE REDUCTION: This exam was performed according to the departmental dose-optimization program which includes automated exposure control, adjustment of the mA and/or kV according to  patient size and/or use of iterative reconstruction technique. COMPARISON:  Prior head CT 09/29/2011. FINDINGS: Brain: Mild-to-moderate cerebral atrophy. Mild cerebellar atrophy. 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 Suspected small chronic cortical/subcortical infarct within the high right parietal lobe, new from the prior head CT of 09/29/2011 (series 2, images 22-26). There is no acute intracranial hemorrhage. No acute demarcated cortical infarct. No extra-axial fluid collection. No evidence of an intracranial mass. No midline shift. Vascular: No hyperdense vessel. Atherosclerotic calcifications. Skull: No fracture or aggressive osseous lesion. Sinuses/Orbits: No mass or acute finding within the imaged orbits. No significant paranasal sinus disease at the imaged levels. IMPRESSION: No evidence of acute intracranial abnormality. 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 Suspected small chronic cortical/subcortical infarct within the high right parietal lobe, new from the prior head CT of 09/29/2011 (MCA/ACA watershed territory). 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 Mild-to-moderate cerebral atrophy. Mild cerebellar atrophy. Electronically Signed   By: Kellie Simmering D.O.   On: 02/22/2022 14:56   DG Chest Port 1 View  Result Date: 02/22/2022 CLINICAL DATA:  weakness EXAM: PORTABLE CHEST 1 VIEW COMPARISON:  December 13, 2021 FINDINGS: Again seen are the sternotomy wires, CABG changes and mitral valve replacement changes. Stable right subclavian single lead pacemaker defibrillator. Cardiomegaly. Bilateral pleural effusion greater on the left and is new. Mild central pulmonary vascular congestion. No focal  consolidation. The visualized skeletal structures are unremarkable. IMPRESSION: Bilateral small  pleural effusion greater on the left and is new. Cardiomegaly and mild central pulmonary vascular congestion. Electronically Signed   By: Frazier Richards M.D.   On: 02/22/2022 14:47    EKG: I independently viewed the EKG done and my findings are as followed: Atrial fibrillation rate of 104.  Nonspecific ST-T changes.  QTc 526.  Assessment/Plan Present on Admission:  AKI (acute kidney injury) ([place])  Principal Problem:   AKI (acute kidney injury) ([place])  AKI on CKD 3B suspect overdiuresis Baseline creatinine appears to be 2.15 with GFR of 33 Avoid nephrotoxic agents, dehydration and hypotension. Hold off home diuretics for now Monitor urine output with strict I's and O's Repeat renal panel in the morning Obtain renal ultrasound to rule out any acute structural abnormalities  Acute metabolic encephalopathy, suspect multifactorial AKI, dehydration, acute illness, old strokes Treat underlying conditions Reorient as needed Fall/aspiration precautions  Suspected small chronic cortical/subcortical infarct within the high right parietal lobe, new from the prior CT of 09/29/2011.  Obtain fasting lipid panel, hemoglobin A1c Recent 2D echo High intensity statin On home Coumadin and supratherapeutic with INR 5.2  Supratherapeutic INR INR 5.2 on 02/22/22 No overt bleeding Daily INR Appreciate pharmacy's assistance  Type 2 diabetes with hyperglycemia Obtain hemoglobin A1c Start insulin sliding scale  Generalized weakness PT OT to assess Fall precautions TOC consulted to assist with DC planning   Critical care time: 65 minutes.     DVT prophylaxis: Supratherapeutic on Coumadin  Code Status: Full code  Family Communication: None at bedside  Disposition Plan: Admitted to telemetry cardiac unit  Consults called: None.  Admission status: Inpatient status.   Status is: Inpatient The patient requires at least 2 midnights for further evaluation and treatment of present  condition.   Kayleen Memos MD [facility] Pager [phone]  If 7PM-7AM, please contact night-coverage [email or web address] Password [facility]  02/22/2022, 11:48 PM

## 2022-02-23 ENCOUNTER — Inpatient Hospital Stay (HOSPITAL_COMMUNITY): Payer: PPO

## 2022-02-23 DIAGNOSIS — I519 Heart disease, unspecified: Secondary | ICD-10-CM

## 2022-02-23 DIAGNOSIS — I255 Ischemic cardiomyopathy: Secondary | ICD-10-CM

## 2022-02-23 DIAGNOSIS — I482 Chronic atrial fibrillation, unspecified: Secondary | ICD-10-CM | POA: Diagnosis not present

## 2022-02-23 DIAGNOSIS — E7800 Pure hypercholesterolemia, unspecified: Secondary | ICD-10-CM

## 2022-02-23 DIAGNOSIS — E1121 Type 2 diabetes mellitus with diabetic nephropathy: Secondary | ICD-10-CM

## 2022-02-23 DIAGNOSIS — N179 Acute kidney failure, unspecified: Secondary | ICD-10-CM | POA: Diagnosis not present

## 2022-02-23 DIAGNOSIS — R410 Disorientation, unspecified: Secondary | ICD-10-CM

## 2022-02-23 DIAGNOSIS — K219 Gastro-esophageal reflux disease without esophagitis: Secondary | ICD-10-CM | POA: Diagnosis present

## 2022-02-23 LAB — CBC WITH DIFFERENTIAL/PLATELET
Abs Immature Granulocytes: 0.02 10*3/uL (ref 0.00–0.07)
Basophils Absolute: 0 10*3/uL (ref 0.0–0.1)
Basophils Relative: 1 %
Eosinophils Absolute: 0.1 10*3/uL (ref 0.0–0.5)
Eosinophils Relative: 3 %
HCT: 38.4 % — ABNORMAL LOW (ref 39.0–52.0)
Hemoglobin: 12 g/dL — ABNORMAL LOW (ref 13.0–17.0)
Immature Granulocytes: 1 %
Lymphocytes Relative: 11 %
Lymphs Abs: 0.4 10*3/uL — ABNORMAL LOW (ref 0.7–4.0)
MCH: 27.3 pg (ref 26.0–34.0)
MCHC: 31.3 g/dL (ref 30.0–36.0)
MCV: 87.5 fL (ref 80.0–100.0)
Monocytes Absolute: 0.5 10*3/uL (ref 0.1–1.0)
Monocytes Relative: 14 %
Neutro Abs: 2.7 10*3/uL (ref 1.7–7.7)
Neutrophils Relative %: 70 %
Platelets: 148 10*3/uL — ABNORMAL LOW (ref 150–400)
RBC: 4.39 MIL/uL (ref 4.22–5.81)
RDW: 17.4 % — ABNORMAL HIGH (ref 11.5–15.5)
WBC: 3.8 10*3/uL — ABNORMAL LOW (ref 4.0–10.5)
nRBC: 0 % (ref 0.0–0.2)

## 2022-02-23 LAB — COMPREHENSIVE METABOLIC PANEL
ALT: 16 U/L (ref 0–44)
AST: 25 U/L (ref 15–41)
Albumin: 2.7 g/dL — ABNORMAL LOW (ref 3.5–5.0)
Alkaline Phosphatase: 304 U/L — ABNORMAL HIGH (ref 38–126)
Anion gap: 16 — ABNORMAL HIGH (ref 5–15)
BUN: 133 mg/dL — ABNORMAL HIGH (ref 8–23)
CO2: 27 mmol/L (ref 22–32)
Calcium: 8.7 mg/dL — ABNORMAL LOW (ref 8.9–10.3)
Chloride: 94 mmol/L — ABNORMAL LOW (ref 98–111)
Creatinine, Ser: 4.15 mg/dL — ABNORMAL HIGH (ref 0.61–1.24)
GFR, Estimated: 15 mL/min — ABNORMAL LOW (ref >60)
Glucose, Bld: 181 mg/dL — ABNORMAL HIGH (ref 70–99)
Potassium: 3.6 mmol/L (ref 3.5–5.1)
Sodium: 137 mmol/L (ref 135–145)
Total Bilirubin: 2 mg/dL — ABNORMAL HIGH (ref 0.3–1.2)
Total Protein: 6 g/dL — ABNORMAL LOW (ref 6.5–8.1)

## 2022-02-23 LAB — HEMOGLOBIN A1C
Hgb A1c MFr Bld: 8.5 % — ABNORMAL HIGH (ref 4.8–5.6)
Mean Plasma Glucose: 197 mg/dL

## 2022-02-23 LAB — GLUCOSE, CAPILLARY
Glucose-Capillary: 178 mg/dL — ABNORMAL HIGH (ref 70–99)
Glucose-Capillary: 169 mg/dL — ABNORMAL HIGH (ref 70–99)
Glucose-Capillary: 187 mg/dL — ABNORMAL HIGH (ref 70–99)
Glucose-Capillary: 76 mg/dL (ref 70–99)

## 2022-02-23 LAB — PROTIME-INR
INR: 5.1 (ref 0.8–1.2)
Prothrombin Time: 46.7 seconds — ABNORMAL HIGH (ref 11.4–15.2)

## 2022-02-23 LAB — MAGNESIUM: Magnesium: 2.6 mg/dL — ABNORMAL HIGH (ref 1.7–2.4)

## 2022-02-23 LAB — PHOSPHORUS: Phosphorus: 4.5 mg/dL (ref 2.5–4.6)

## 2022-02-23 MED ORDER — SODIUM BICARBONATE 650 MG PO TABS
650.0000 mg | ORAL_TABLET | Freq: Two times a day (BID) | ORAL | Status: DC
  Administered 2022-02-23 – 2022-02-26 (×7): 650 mg via ORAL
  Filled 2022-02-23 (×7): qty 1

## 2022-02-23 MED ORDER — METOLAZONE 5 MG PO TABS
5.0000 mg | ORAL_TABLET | Freq: Once | ORAL | Status: DC
  Filled 2022-02-23: qty 1

## 2022-02-23 MED ORDER — ROSUVASTATIN CALCIUM 20 MG PO TABS
40.0000 mg | ORAL_TABLET | ORAL | Status: DC
Start: 2022-02-23 — End: 2022-02-28
  Administered 2022-02-23 – 2022-02-27 (×3): 40 mg via ORAL
  Filled 2022-02-23 (×3): qty 2

## 2022-02-23 MED ORDER — SODIUM CHLORIDE 0.9 % IV SOLN: INTRAVENOUS | Status: DC

## 2022-02-23 MED ORDER — FUROSEMIDE 10 MG/ML IJ SOLN: 80.0000 mg | Freq: Once | INTRAMUSCULAR | Status: DC

## 2022-02-23 MED ORDER — ACETAMINOPHEN 325 MG PO TABS
650.0000 mg | ORAL_TABLET | Freq: Four times a day (QID) | ORAL | Status: DC | PRN
Start: 2022-02-23 — End: 2022-03-03
  Administered 2022-02-23 – 2022-02-28 (×4): 650 mg via ORAL
  Filled 2022-02-23 (×4): qty 2

## 2022-02-23 MED ORDER — PANTOPRAZOLE SODIUM 40 MG PO TBEC
40.0000 mg | DELAYED_RELEASE_TABLET | Freq: Every day | ORAL | Status: DC
Start: 2022-02-23 — End: 2022-03-01
  Administered 2022-02-23 – 2022-03-01 (×7): 40 mg via ORAL
  Filled 2022-02-23 (×7): qty 1

## 2022-02-23 MED ORDER — PHYTONADIONE 5 MG PO TABS
2.5000 mg | ORAL_TABLET | Freq: Once | ORAL | Status: AC
  Administered 2022-02-23: 2.5 mg via ORAL
  Filled 2022-02-23: qty 1

## 2022-02-23 NOTE — Assessment & Plan Note (Signed)
Patient this am with atrial flutter with rapid ventricular response. Not candidate for AV blockade due to low output heart failure.  Patient will need amiodarone for rate and possible rhythm control. Continue anticoagulation with warfarin. INR today is 2.7.  Continue telemetry monitoring.

## 2022-02-23 NOTE — Evaluation (Signed)
Physical Therapy Evaluation Patient Details Name: John [name] MRN: [id number] DOB: 1951/05/13 Today's Date: 02/23/2022  History of Present Illness  The pt is a 71 yo male with increased confusion. 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 Chest: b/l small pleural effusion. PMH includes: arthritis, afib on Coumadin, CKD III, CAD s/p CABG 2002, CHF, GERD, HLD, HTN, Rheumatic heart disease, MVR, and DM II.  Clinical Impression  PTA pt living with wife in single story home with 4 steps to enter. Pt reports independence with mobility, and with bathing and dressing. Pt is limited in safe mobility by generalized weakness and decreased endurance. Pt is currently supervision for transfers and min guard for ambulation pushing IV pole. Pt is likely very near to his baseline level of function, however would benefit from HHPT to improve strength and endurance. PT will continue to follow acutely.     Recommendations for follow up therapy are one component of a multi-disciplinary discharge planning process, led by the attending physician.  Recommendations may be updated based on patient status, additional functional criteria and insurance authorization.  Follow Up Recommendations Home health PT    Assistance Recommended at Discharge Intermittent Supervision/Assistance  Patient can return home with the following  A little help with walking and/or transfers;A little help with bathing/dressing/bathroom;Help with stairs or ramp for entrance    Equipment Recommendations None recommended by PT     Functional Status Assessment Patient has had a recent decline in their functional status and demonstrates the ability to make significant improvements in function in a reasonable and predictable amount of time.     Precautions / Restrictions Precautions Precautions: Fall Required Braces or Orthoses:  (has bilateral Darco shoes however can not tell  me why) Restrictions Weight Bearing Restrictions: No      Mobility  Bed Mobility               General bed mobility comments: OOB in recliner    Transfers Overall transfer level: Needs assistance Equipment used: None Transfers: Sit to/from Stand Sit to Stand: Supervision           General transfer comment: no physical assist required    Ambulation/Gait Ambulation/Gait assistance: Min guard Gait Distance (Feet): 80 Feet Assistive device: IV Pole Gait Pattern/deviations: Step-through pattern, Decreased step length - right, Decreased step length - left, Shuffle Gait velocity: slowed Gait velocity interpretation: <1.31 ft/sec, indicative of household ambulator   General Gait Details: min guard for safety with slowed, shuffling gait, mild instability but no overt LoB        Balance Overall balance assessment: Mild deficits observed, not formally tested                                           Pertinent Vitals/Pain Pain Assessment Pain Assessment: No/denies pain    Home Living Family/patient expects to be discharged to:: Private residence Living Arrangements: Spouse/significant other Available Help at Discharge: Family;Available PRN/intermittently Type of Home: Mobile home Home Access: Stairs to enter Entrance Stairs-Rails: Can reach both Entrance Stairs-Number of Steps: 5 STE   Home Layout: One level Home Equipment: Conservation officer, nature (2 wheels)      Prior Function Prior Level of Function : Independent/Modified Independent             Mobility Comments: Pt reports that he was ambulating w/o AD  prior to this hospitialization ADLs Comments: Pt reports independence, does some cooking and cleaning as well     Hand Dominance   Dominant Hand: Left    Extremity/Trunk Assessment   Upper Extremity Assessment Upper Extremity Assessment: Defer to OT evaluation    Lower Extremity Assessment Lower Extremity Assessment: RLE  deficits/detail;LLE deficits/detail RLE Sensation: history of peripheral neuropathy LLE Sensation: history of peripheral neuropathy    Cervical / Trunk Assessment Cervical / Trunk Assessment: Normal  Communication   Communication: No difficulties  Cognition Arousal/Alertness: Awake/alert Behavior During Therapy: WFL for tasks assessed/performed Overall Cognitive Status: Within Functional Limits for tasks assessed                                          General Comments General comments (skin integrity, edema, etc.): sitting 86/63 HR 93 standing 88/58 HR 98, after ambulation 90/63 HR 99        Assessment/Plan    PT Assessment Patient needs continued PT services  PT Problem List Decreased strength;Decreased activity tolerance;Decreased mobility       PT Treatment Interventions DME instruction;Gait training;Stair training;Functional mobility training;Therapeutic activities;Therapeutic exercise;Balance training;Cognitive remediation;Patient/family education    PT Goals (Current goals can be found in the Care Plan section)  Acute Rehab PT Goals Patient Stated Goal: go home PT Goal Formulation: With patient Time For Goal Achievement: 03/09/22 Potential to Achieve Goals: Good    Frequency Min 3X/week     AM-PAC PT "6 Clicks" Mobility  Outcome Measure Help needed turning from your back to your side while in a flat bed without using bedrails?: None Help needed moving from lying on your back to sitting on the side of a flat bed without using bedrails?: None Help needed moving to and from a bed to a chair (including a wheelchair)?: None Help needed standing up from a chair using your arms (e.g., wheelchair or bedside chair)?: A Little Help needed to walk in hospital room?: A Lot Help needed climbing 3-5 steps with a railing? : A Lot 6 Click Score: 19    End of Session Equipment Utilized During Treatment: Gait belt Activity Tolerance: Patient tolerated  treatment well Patient left: in chair;with call bell/phone within reach;with chair alarm set Nurse Communication: Mobility status;Precautions PT Visit Diagnosis: Other abnormalities of gait and mobility (R26.89);Muscle weakness (generalized) (M62.81);Difficulty in walking, not elsewhere classified (R26.2)    Time: 2637-8588 PT Time Calculation (min) (ACUTE ONLY): 16 min   Charges:   PT Evaluation $PT Eval Moderate Complexity: 1 Mod          Gabreal Worton B. Migdalia Dk PT, DPT Acute Rehabilitation Services Please use secure chat or  Call Office [phone]   [place] 02/23/2022, 1:38 PM

## 2022-02-23 NOTE — Evaluation (Signed)
Occupational Therapy Evaluation Patient Details Name: John [name] MRN: [id number] DOB: 1951-02-13 Today's Date: 02/23/2022   History of Present Illness The pt is a 71 yo male with increased confusion. 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 Chest: b/l small pleural effusion. PMH includes: arthritis, afib on Coumadin, CKD III, CAD s/p CABG 2002, CHF, GERD, HLD, HTN, Rheumatic heart disease, MVR, and DM II.   Clinical Impression   Pt PTA: Pt living with spouse, mostly independent with ADL and mobility. Pt currently,appears at his functional baseline for ADL, possibly taking increased time, but no physical assist required for UB ADL, transfers and ADL functional mobility with no AD. Pt wearing [employer] shoes for mobility. Pt's BP: sitting: 91/62, 96 BPM; standing: 86/53, 93 BPM  BP lower with no symptoms. Pt would benefit from continued OT skilled services. OT following acutely.     Recommendations for follow up therapy are one component of a multi-disciplinary discharge planning process, led by the attending physician.  Recommendations may be updated based on patient status, additional functional criteria and insurance authorization.   Follow Up Recommendations  No OT follow up    Assistance Recommended at Discharge Set up Supervision/Assistance  Patient can return home with the following A little help with bathing/dressing/bathroom    Functional Status Assessment  Patient has had a recent decline in their functional status and demonstrates the ability to make significant improvements in function in a reasonable and predictable amount of time.  Equipment Recommendations  BSC/3in1    Recommendations for Other Services       Precautions / Restrictions Precautions Precautions: Fall Restrictions Weight Bearing Restrictions: No      Mobility Bed Mobility Overal bed mobility: Needs Assistance Bed Mobility: Supine to  Sit     Supine to sit: Supervision     General bed mobility comments: increased time and use of bed rail    Transfers Overall transfer level: Needs assistance Equipment used: None Transfers: Sit to/from Stand Sit to Stand: Supervision           General transfer comment: no physical assist required      Balance Overall balance assessment: Mild deficits observed, not formally tested                                         ADL either performed or assessed with clinical judgement   ADL Overall ADL's : At baseline                                       General ADL Comments: Pt appears at his functional baseline for ADL, possibly taking increased time, but no physical assist required for UB ADL, transfers and ADL functional mobility with no AD. Pt wearing [employer] shoes for mobility.     Vision Baseline Vision/History: 1 Wears glasses Ability to See in Adequate Light: 0 Adequate Patient Visual Report: No change from baseline Vision Assessment?: No apparent visual deficits     Perception     Praxis      Pertinent Vitals/Pain Pain Assessment Pain Assessment: No/denies pain     Hand Dominance Left   Extremity/Trunk Assessment Upper Extremity Assessment Upper Extremity Assessment: Overall WFL for tasks assessed   Lower Extremity Assessment Lower Extremity Assessment: Generalized weakness  Cervical / Trunk Assessment Cervical / Trunk Assessment: Normal   Communication Communication Communication: No difficulties   Cognition Arousal/Alertness: Awake/alert Behavior During Therapy: WFL for tasks assessed/performed Overall Cognitive Status: Within Functional Limits for tasks assessed                                       General Comments  sitting: 91/62, 96 BPM; standing: 86/53, 93 BPM    Exercises     Shoulder Instructions      Home Living Family/patient expects to be discharged to:: Private  residence Living Arrangements: Spouse/significant other Available Help at Discharge: Family;Available PRN/intermittently Type of Home: Mobile home Home Access: Stairs to enter Entrance Stairs-Number of Steps: 5 STE Entrance Stairs-Rails: Can reach both Home Layout: One level     Bathroom Shower/Tub: Teacher, early years/pre: Handicapped height Bathroom Accessibility: Yes   Home Equipment: Conservation officer, nature (2 wheels)          Prior Functioning/Environment Prior Level of Function : Independent/Modified Independent             Mobility Comments: Pt reports that he was ambulating w/o AD prior to this hospitialization ADLs Comments: Pt reports independence, does some cooking and cleaning as well        OT Problem List: Decreased strength;Decreased activity tolerance;Impaired balance (sitting and/or standing);Decreased safety awareness;Pain;Cardiopulmonary status limiting activity      OT Treatment/Interventions: Self-care/ADL training;Therapeutic exercise;Energy conservation;DME and/or AE instruction;Therapeutic activities;Patient/family education;Balance training    OT Goals(Current goals can be found in the care plan section) Acute Rehab OT Goals Patient Stated Goal: to go home OT Goal Formulation: With patient Time For Goal Achievement: 03/09/22 Potential to Achieve Goals: Good ADL Goals Pt Will Perform Lower Body Dressing: with min assist;sit to/from stand Pt Will Perform Toileting - Clothing Manipulation and hygiene: sit to/from stand;with min guard assist Additional ADL Goal #1: Pt will state 3 energy conservation strategies in order to increase independence at home.  OT Frequency: Min 2X/week    Co-evaluation              AM-PAC OT "6 Clicks" Daily Activity     Outcome Measure Help from another person eating meals?: None Help from another person taking care of personal grooming?: None Help from another person toileting, which includes using  toliet, bedpan, or urinal?: A Little Help from another person bathing (including washing, rinsing, drying)?: A Little Help from another person to put on and taking off regular upper body clothing?: None Help from another person to put on and taking off regular lower body clothing?: A Little 6 Click Score: 21   End of Session Equipment Utilized During Treatment: Gait belt Nurse Communication: Mobility status  Activity Tolerance: Patient tolerated treatment well Patient left: in chair;with call bell/phone within reach  OT Visit Diagnosis: Unsteadiness on feet (R26.81);Muscle weakness (generalized) (M62.81)                Time: 0940-1005 OT Time Calculation (min): 25 min Charges:  OT General Charges $OT Visit: 1 Visit OT Evaluation $OT Eval Moderate Complexity: 1 Mod OT Treatments $Self Care/Home Management : 8-22 mins  Jefferey Pica, OTR/L Acute Rehabilitation Services Office: [phone]'  Audie Pinto 02/23/2022, 10:38 AM

## 2022-02-23 NOTE — Assessment & Plan Note (Addendum)
CKD stage 3b Continue worsening renal function, likely cardiorenal syndrome. Renal function today with serum cr at 4.20 with BUN 141, K is 3,8 and serum bicarbonate is 21.   Plan to consult cardiology for inotropic support.  Continue close follow up renal function and electrolytes.  Considering rheumatic heart disease with aortic stenosis, renal replacement therapy will be likely challenging.  Patient does not want hemodialysis, will order a PICC line for vaso-active medications. Discussed with Dr Augustin Coupe from nephrology.

## 2022-02-23 NOTE — Assessment & Plan Note (Addendum)
Echocardiogram with LV EF reduced to less than 20%, global hypokinesis, severe dilatation of internal cavity. RV systolic function with severe reduction. Mild to moderate TR, mechanical mitral valve, severe aortic stenosis (low flow/ low gradient). Moderate elevation of pulmonary artery systolic pressure.    Rheumatic heart disease, sp mitral valve replacement.   Cardiogenic shock/ low output heart failure decompensation.   Urine output over last 24 hrs is 450 ml  Blood pressure systolic 79 to 91 mmHg.   Medical therapy with furosemide drip at 8 mg per hr. Milrinone drip Midodrine 15 mg po tid.  Oral metolazone 5 mg daily.   Very poor prognosis. Code statu changed to DNR.

## 2022-02-23 NOTE — Assessment & Plan Note (Addendum)
Hypotension. Persistent hypotension despite midodrine.

## 2022-02-23 NOTE — Progress Notes (Signed)
TRH night cross cover note:  I was notified by RN of the following critical lab result this morning: INR 5.1, which is down slightly from most recent prior value of 5.2 when checked yesterday.  Per my chart review, this is a 71 year old male with history of mechanical mitral valve replacement in 2002/chronic atrial fibrillation, chronically anticoagulated on warfarin, who is admitted with acute encephalopathy, felt to be more likely metabolic in nature, with concomitant AKI superimposed on stage IIIb CKD.  Per my chart review, presentation does not appear to be associated with any acute bleeding.  CBC this morning notable for hemoglobin of 12.0 compared to most recent prior value of 12.6 yesterday.  Inpatient pharmacy already consulted for assistance with management of warfarin dosing/INR monitoring during this hospitalization.    Babs Bertin, DO Hospitalist

## 2022-02-23 NOTE — Assessment & Plan Note (Signed)
-   No chest pain. -Continue statins -Chronically on warfarin

## 2022-02-23 NOTE — Progress Notes (Addendum)
INR 5.1, MD notified, will continue to monitor, Thanks, Arvella Nigh RN

## 2022-02-23 NOTE — TOC Initial Note (Signed)
Transition of Care [facility]) - Initial/Assessment Note    Patient Details  Name: John [name] MRN: [id number] Date of Birth: July 09, 1951  [facility]) CM/SW Contact:    Zenon Mayo, RN Phone Number: 02/23/2022, 4:57 PM  Clinical Narrative:                 NCM spoke with patient and wife at the bedside, he has a rolling walker at home. NCM offered choice for HHPT, wife states has no preference, NCM made referral to [facility] with [facility], he is able to take referral, soc will begin 24 to 48 hrs post dc.  Wife will transport him home at dc.  PCP is Dr. Livia Snellen, [facility] [place]  Wife works full time, son will be with him during the day since he works 3rd shift. He weighs himself daily, checks bp daily and eats a no salt diet per wife.  She helps him with his medications with pill box.  Here with HF, AKI, some confusion ongoing fo r2 weeks, goes to HF clinic, CT scan shows old stroke from Jan, BUN elevated, conts on IVF's, nephrology to see, lasix on hold cret is at 4.  TOC will continue to follow for dc needs.   Expected Discharge Plan: [place] Barriers to Discharge: Continued Medical Work up   Patient Goals and CMS Choice Patient states their goals for this hospitalization and ongoing recovery are:: return home CMS Medicare.gov Compare Post Acute Care list provided to:: Patient Represenative (must comment) Choice offered to / list presented to : Spouse  Expected Discharge Plan and Services Expected Discharge Plan: [place]   Discharge Planning Services: CM Consult Post Acute Care Choice: [place] arrangements for the past 2 months: Single Family Home                   DME Agency: NA       HH Arranged: PT HH Agency: [place] Date [facility] Agency Contacted: 02/23/22 Time [place] Agency Contacted: [phone] Representative spoke with at [place]: Tommi Rumps  Prior Living Arrangements/Services Living arrangements  for the past 2 months: [place] Lives with:: Spouse Patient language and need for interpreter reviewed:: Yes Do you feel safe going back to the place where you live?: Yes      Need for Family Participation in Patient Care: Yes (Comment) Care giver support system in place?: Yes (comment) Current home services:  (walker) Criminal Activity/Legal Involvement Pertinent to Current Situation/Hospitalization: No - Comment as needed  Activities of Daily Living   ADL Screening (condition at time of admission) Patient's cognitive ability adequate to safely complete daily activities?: Yes Is the patient deaf or have difficulty hearing?: No Does the patient have difficulty seeing, even when wearing glasses/contacts?: No Does the patient have difficulty concentrating, remembering, or making decisions?: No Patient able to express need for assistance with ADLs?: Yes Does the patient have difficulty dressing or bathing?: Yes Independently performs ADLs?: No  Permission Sought/Granted                  Emotional Assessment Appearance:: Appears stated age Attitude/Demeanor/Rapport: Engaged Affect (typically observed): Appropriate Orientation: : Oriented to Self, Oriented to Place, Oriented to  Time, Oriented to Situation Alcohol / Substance Use: Not Applicable Psych Involvement: No (comment)  Admission diagnosis:  Hypokalemia [E87.6] Acute confusion [R41.0] AKI (acute kidney injury) ([place]) [N17.9] Patient Active Problem List   Diagnosis  Date Noted   Gastroesophageal reflux disease 02/23/2022   Hypertensive heart and chronic kidney disease with heart failure and stage 1 through stage 4 chronic kidney disease, or chronic kidney disease ([place]) 12/02/2021   AKI (acute kidney injury) ([place]) 12/02/2021   Gout 12/02/2021   Iron deficiency anemia 12/02/2021   Acute on chronic systolic (congestive) heart failure (HCC) 11/17/2021   Chronic diastolic HF (heart failure) ([place]) 07/25/2021   CRI  (chronic renal insufficiency), stage 3 (moderate) (HCC) 91/50/5697   Chronic systolic dysfunction of left ventricle 08/23/2020   Hypoglycemia 07/01/2020   Aortic valve stenosis    ICD (implantable cardioverter-defibrillator) in place 94/80/1655   Systolic CHF ([place]) 37/48/2707   Long term (current) use of anticoagulants [Z79.01] 10/24/2017   Type 2 diabetes with nephropathy ([place]) 09/23/2017   Encounter for therapeutic drug monitoring 05/06/2017   Ischemic cardiomyopathy 04/17/2017   Incisional hernia 03/15/2014   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 [facility]) 03/26/2013   Anemia 08/04/2012   CAD (coronary artery disease) 05/09/2011   Obesity 05/09/2011   Chronic atrial fibrillation ([place]) 01/11/2010   Angina pectoris ([place]) 01/11/2010   MITRAL VALVE REPLACEMENT, HX OF 01/11/2010   Hyperlipidemia 11/30/2008   HTN (hypertension) 11/30/2008   PULMONARY NODULE 11/30/2008   PCP:  Claretta Fraise, MD Pharmacy[facility]   [id number] [place] [address] [address]Phone: [phone]: [phone] [address]Phone: [phone]: [address] [facility]Phone: [phone]: [phone]    Social Determinants of Health (SDOH) Interventions    Readmission Risk Interventions    02/23/2022    4:55 PM  Readmission Risk Prevention Plan  Transportation Screening Complete  HRI or [place]  Social Work Consult for [place] City[place]anning/Counseling Complete  Palliative Care Screening Not Applicable  Medication Review (Press photographer Complete

## 2022-02-23 NOTE — Assessment & Plan Note (Signed)
Hold on basal insulin due to low GFR. Continue with insulin sliding scale for glucose cover and monitoring.  Fasting glucose this am is '173mg'$ /dl.  Continue with statin therapy.

## 2022-02-23 NOTE — Progress Notes (Signed)
Diuretics held per J. Howerter. BP low see flow sheet vitals.

## 2022-02-23 NOTE — Progress Notes (Signed)
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  Allergies  Allergen Reactions   Allopurinol Shortness Of Breath   Doxycycline Hives   Farxiga [Dapagliflozin]     Can not tolerate - dizzy weak almost passed out    Patient Measurements: Height: '5\' 11"'$  (180.3 cm) Weight: 84.7 kg (186 lb 12.8 oz) IBW/kg (Calculated) : 75.3  Vital Signs: Temp: 97.4 F (36.3 C) (06/16 0440) Temp Source: Oral (06/16 0440) BP: 92/59 (06/16 0501) Pulse Rate: 89 (06/16 0501)  Labs: Recent Labs    02/22/22 1427 02/22/22 1633 02/23/22 0453  HGB 12.6*  --  12.0*  HCT 40.1  --  38.4*  PLT 170  --  148*  LABPROT 47.5*  --  46.7*  INR 5.2*  --  5.1*  CREATININE 4.04*  --  4.15*  TROPONINIHS 54* 61*  --      Estimated Creatinine Clearance: 18.1 mL/min (A) (by C-G formula based on SCr of 4.15 mg/dL (H)).   Medical History: Past Medical History:  Diagnosis Date   Abnormal nuclear stress test 05/10/2017   Allergy    Anemia    "lost blood w/the cancer"   Arthritis    back    Atrial fibrillation (HCC)    takes Coumadin daily   Bruises easily    takes Coumadin daily   CAD (coronary artery disease)    a. s/p CABG 11/2000 (L-LAD, S-RCA);  b. Lexiscan Myoview 11/12: EF 45%, ischemia and possibly some scar at the base of the inferolateral wall;   c. 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 s/p mechanical (St. Jude) MVR 2002;  b. Echo 5/11: Mild LVH, EF 45-50%, mild AS, mild AI, mean gradient 11 mmHg, MVR with normal gradients, moderate LAE, mild RAE;  c.  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 Patient had Rheumatic Fever in 1965 with resultant Rheumatic Heart Disease requiring a MVR in 2002.   02/23/2022 AM  His INR  is supra-therapeutic at 5.1 There was no bleed on CT of the head which was completed on 02/22/2022  Home dose of coumadin was recently changed [01/2022] to 2.5 mg daily His last dose of coumadin was ingested on 02/21/2022 per patient and significant other report during med rec process.  Goal of Therapy:  INR 2.5-3.5 Monitor platelets by anticoagulation protocol: Yes   Plan:  Coumadin- Hold dose today Daily PT/INR, resume warfarin as INR allows  Kripa Foskey BS, PharmD, BCPS Clinical Pharmacist 02/23/2022 7:30 AM  Contact: [phone] after 3 PM  "Be curious, not judgmental..." -Jamal Maes

## 2022-02-23 NOTE — Assessment & Plan Note (Signed)
Continue PPI ?

## 2022-02-23 NOTE — Progress Notes (Signed)
Progress Note   Patient: John [name]:[id number] DOB: December 25, 1950 DOA: 02/22/2022     1 DOS: the patient was seen and examined on 02/23/2022   Brief hospital course: As per H&P written by Dr. Nevada Crane on 02/22/22 John [name] is a 71 y.o. male with medical history significant for HFrEF 20 to 25%, essential hypertension, coronary artery disease status post CABG x2 in 2002, mechanical mitral valve replacement in 2002 on Coumadin, [employer] Jude ICD, aortic valve stenosis, rheumatic fever, GERD, CKD 3B, permanent atrial fibrillation, hyperlipidemia, type 2 diabetes, who initially presented to his cardiologist for a routine follow-up related to heart failure and hypotension, he was found to be in acute confusion.  Per his wife, he has been confused for the past 3 weeks and it is progressive.  They were advised to go to the ED for further evaluation.  Prior to going to the ED the patient was taken by his wife to his wound care specialist for chronic bilateral lower extremity cellulitis, POA.     Work-up in the ED revealed suspected small chronic cortical/subcortical infarct within the high right parietal lobe, new from the prior head CT of 09/29/2011 ([facility]).  The patient was accepted by Dr. Avon Gully, [facility] and transferred to [facility] for further evaluation and management of his present condition.   ED Course: Tmax 97.5.  BP 97/63, pulse 87, respiration rate 18, saturation 97% on 2 L.  Lab studies remarkable for serum potassium 3.3, glucose 231, BUN 129, creatinine 4.04, anion gap 17, serum bicarb 25, alkaline phosphatase 323, albumin 3.4, magnesium 2.5.  Total bilirubin 2.1.  GFR 15.  BNP 3004.  Troponin 54, 61.  Assessment and Plan: * AKI (acute kidney injury) ([place]) - Acute kidney injury on chronic kidney disease stage IV -Suspected to be prerenal -None demonstrating no obstructive uropathy -Continue holding nephrotoxic agents -Gentle IV fluids challenge  overnight -Strict I's and O's -Based on clinical response will recommend involvement of nephrology service. -After discussing with patient he do not want hemodialysis. -Continue serum bicarbonate.  Gastroesophageal reflux disease - Continue PPI.  Chronic systolic dysfunction of left ventricle - Appears to be dehydrated currently -Holding diuretics -Follow daily weights and strict I's and O's -Patient denies shortness of breath.  Type 2 diabetes with nephropathy (HCC) - Continue holding oral hypoglycemic agents while inpatient. -Ultrasensitive sliding scale insulin has been ordered.  Ischemic cardiomyopathy - No chest pain. -Continue statins -Chronically on warfarin   Chronic atrial fibrillation (HCC) - Chronically on warfarin for secondary prevention -Rate controlled -INR supratherapeutic; low-dose vitamin K provided.  Pharmacy adjusting warfarin dose.  HTN (hypertension) -Currently soft but stable -Avoid hypotension in the setting of acute on chronic renal failure. -Maintain adequate hydration. -Follow-up vital signs. -Holding antihypertensive agents at this point.  Hyperlipidemia - Continue statins -Heart healthy diet discussed with patient.  Metabolic encephalopathy -Appears to be associated with most likely uremia in the setting worsening renal function -hold sedative meds -constant reorientation.  Supratherapeutic INR -As mentioned above pharmacy adjusting warfarin dose. -Low-dose vitamin K x1 will be given.  History of stroke -Appears to be old and found on CT scan -No bleeding appreciated -continue risk factors modifications -chronically on warfarin.  Subjective:  Chronically ill in appearance; denies chest pain, no nausea, no vomiting.  Patient is afebrile.  Reports decreased appetite.  Following commands appropriately.  Physical Exam: Vitals:   02/23/22 0440 02/23/22 0501 02/23/22 0859 02/23/22 0926  BP: (!) 80/50 (!) 92/59  92/67  Pulse: 84 89     Resp: '18 18 16 18  '$ Temp: (!) 97.4 F (36.3 C)     TempSrc: Oral     SpO2: 97% 98% 100% 100%  Weight:      Height:       General exam: Alert, awake, oriented x 3 currently; no chest pain, no nausea, no vomiting.  Decreased appetite reported.  Frail and chronically ill in appearance. Respiratory system: Good air movement bilaterally, no wheezing, no crackles. Cardiovascular system: Rate controlled, no rubs, no gallops, no JVD on exam. Gastrointestinal system: Abdomen is nondistended, soft and nontender. No organomegaly or masses felt. Normal bowel sounds heard. Central nervous system: Alert and oriented. No focal neurological deficits. Extremities: No cyanosis or clubbing; chronic lymphedema with unna boots in place. Skin: No petechiae. Chronic lymphedema/venous stasis dermatitis. Unna boot dressings in place.  Psychiatry: Judgement and insight appear normal. Mood & affect appropriate.   Data Reviewed: CBC: WBC 3.8, hemoglobin 12.8, platelet count 148 K. Comprehensive metabolic panel: Sodium 161, potassium 3.6, chloride 94, BUN 133, creatinine 4.15. Magnesium 2.6 Prothrombin time 46.7; INR 5.1.  Family Communication: no family at bedside.  Disposition: Status is: Inpatient Remains inpatient appropriate because: Acute on chronic renal failure; metabolic encephalopathy and supratherapeutic INR.   Planned Discharge Destination: Home   Author: Barton Dubois, MD 02/23/2022 3:20 PM  For on call review [email or web address].

## 2022-02-23 NOTE — Progress Notes (Signed)
ANTICOAGULATION CONSULT NOTE - Initial Consult  Pharmacy Consult for Warfarin  Indication:  mechanical MVR/Afib  Allergies  Allergen Reactions   Allopurinol Shortness Of Breath   Doxycycline Hives   Farxiga [Dapagliflozin]     Can not tolerate - dizzy weak almost passed out    Patient Measurements: Height: '5\' 11"'$  (180.3 cm) Weight: 86.4 kg (190 lb 7.6 oz) IBW/kg (Calculated) : 75.3  Vital Signs: Temp: 96.9 F (36.1 C) (06/15 2322) Temp Source: Axillary (06/15 2322) BP: 97/63 (06/15 2322) Pulse Rate: 87 (06/15 2322)  Labs: Recent Labs    02/22/22 1427 02/22/22 1633  HGB 12.6*  --   HCT 40.1  --   PLT 170  --   LABPROT 47.5*  --   INR 5.2*  --   CREATININE 4.04*  --   TROPONINIHS 54* 61*    Estimated Creatinine Clearance: 18.6 mL/min (A) (by C-G formula based on SCr of 4.04 mg/dL (H)).   Medical History: Past Medical History:  Diagnosis Date   Abnormal nuclear stress test 05/10/2017   Allergy    Anemia    "lost blood w/the cancer"   Arthritis    back    Atrial fibrillation (HCC)    takes Coumadin daily   Bruises easily    takes Coumadin daily   CAD (coronary artery disease)    a. s/p CABG 11/2000 (L-LAD, S-RCA);  b. Lexiscan Myoview 11/12: EF 45%, ischemia and possibly some scar at the base of the inferolateral wall;   c. 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 s/p mechanical (St. Jude) MVR 2002;  b. Echo 5/11: Mild LVH, EF 45-50%, mild AS, mild AI, mean gradient 11 mmHg, MVR with normal gradients, moderate LAE, mild RAE;  c.  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 On warfarin PTA for mechanical MVR and afib. INR is supra-therapeutic at 5.2. No bleed on CT head.   Goal of Therapy:  INR 2.5-3.5 Monitor platelets by anticoagulation protocol: Yes   Plan:  Holding warfarin in setting of supra-therapeutic INR Daily PT/INR, resume warfarin  as INR allows  Narda Bonds, PharmD, BCPS Clinical Pharmacist Phone: [phone]

## 2022-02-23 NOTE — Assessment & Plan Note (Signed)
Continue

## 2022-02-23 NOTE — Consult Note (Signed)
Reason for Consult: Renal failure Referring Physician:  Dr. 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 Urinalysis shows minimal proteinuria and does not display an active sediment. Renal function appears to be progressively worse since 01/31/22 which frequent variation. Ultrasound 6/16 neg for obstructive uropathy. - He appears to be overloaded with a JVD, lower extremity edema and weight is ~84kg. His weight was 81kg at the cardiology appt in late May 2023.  - Will stop the fluids and diurese with Metolazone and Lasix; inotrope if necessary. Appears to be volume overloaded and AKI likely from CRS. - Will also check urine lytes. -Maintain MAP>65 for optimal renal perfusion.  - Avoid nephrotoxic medications including NSAIDs and iodinated intravenous contrast exposure unless the latter is absolutely indicated.   - Preferred narcotic agents for pain control are hydromorphone, fentanyl, and methadone. Morphine should not be used.  - Avoid Baclofen and avoid oral sodium phosphate and magnesium citrate based laxatives / bowel preps.  - Continue strict Input and Output monitoring. Will monitor the patient closely with you and intervene or adjust therapy as indicated by changes in clinical status/labs   Encephalopathy - could this be from slowly worsening renal function? CT shows only a small chronic infarct which is new. HFrEF - 00-37% certainly has edema in the lower extremity; currently receiving gentle hydration. DM New infarct in high right parietal lobe which is new. Atrial fibrillation with a supratherapeutic INR Diffuse large cell lymphoma (diagnosed in 2014, status post R-CHOP in 2014, now in remission)   HPI: John [name] is an 71 y.o. 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 According to his spouse this has been present and worsening for 3 weeks. In the ED the patient had a new small chronic infarct in the right parietal lobe. He was afebrile and found to be in renal failure with a cr of 4.04 and BNP 3004.  Patient had been on torsemide '60mg'$  BID 01/31/2022 at cardiology clinic visit.   usually on torsemide '40mg'$  BID  ROS Pertinent items are noted in HPI.  Chemistry and CBC: Creatinine  Date/Time Value Ref Range Status  07/07/2019 07:40 AM 1.40 (H) 0.61 - 1.24 mg/dL Final  07/08/2018 10:26 AM 1.59 (H) 0.61 - 1.24 mg/dL Final  07/09/2017 08:18 AM 1.4 (H) 0.7 - 1.3 mg/dL Final  01/03/2017 08:38 AM 1.4 (H) 0.7 - 1.3 mg/dL Final  07/05/2016 08:05 AM 1.4 (H) 0.7 - 1.3 mg/dL Final  12/22/2015 07:47 AM 1.4 (H) 0.7 - 1.3 mg/dL Final  06/21/2015 07:57 AM 1.4 (H) 0.7 - 1.3 mg/dL Final  01/18/2015 08:07 AM 1.7 (H) 0.7 - 1.3 mg/dL Final  08/17/2014 07:59 AM 1.3 0.7 - 1.3 mg/dL Final  04/27/2014 08:36 AM 1.5 (H) 0.7 - 1.3 mg/dL Final  12/22/2013 09:09 AM 1.3 0.7 - 1.3 mg/dL Final  10/09/2013 08:31 AM 1.5 (H) 0.7 - 1.3 mg/dL Final  07/09/2013 08:44 AM 1.3 0.7 - 1.3 mg/dL Final  06/18/2013 09:36 AM 1.1 0.7 - 1.3 mg/dL Final  05/28/2013 07:56 AM 1.2 0.7 - 1.3 mg/dL Final  05/07/2013 08:11 AM 1.1 0.7 - 1.3 mg/dL Final  04/14/2013 08:59 AM 1.0 0.7 - 1.3 mg/dL Final  03/26/2013 01:55 PM 1.3 0.7 - 1.3  mg/dL Final  03/06/2013 09:35 AM 1.0 0.7 - 1.3 mg/dL Final  01/21/2013 10:50 AM 1.0 0.7 - 1.3 mg/dL Final   Creatinine, Ser  Date/Time Value Ref Range Status  02/23/2022 04:53 AM 4.15 (H) 0.61 - 1.24 mg/dL Final  02/22/2022 02:27 PM 4.04 (H) 0.61 - 1.24 mg/dL Final  02/19/2022 08:48 AM 3.37 (H) 0.61 - 1.24 mg/dL Final  01/31/2022 11:38 AM 2.15 (H) 0.61 - 1.24 mg/dL Final  01/23/2022 03:18 PM 2.40 (H) 0.61 - 1.24 mg/dL Final  12/18/2021 02:28 PM  2.05 (H) 0.76 - 1.27 mg/dL Final  12/13/2021 08:22 PM 2.47 (H) 0.61 - 1.24 mg/dL Final  12/05/2021 03:16 PM 2.45 (H) 0.76 - 1.27 mg/dL Final  12/02/2021 05:30 AM 2.60 (H) 0.61 - 1.24 mg/dL Final  12/01/2021 04:50 AM 2.85 (H) 0.61 - 1.24 mg/dL Final  11/30/2021 04:10 AM 2.88 (H) 0.61 - 1.24 mg/dL Final  11/29/2021 05:33 AM 2.69 (H) 0.61 - 1.24 mg/dL Final  11/28/2021 04:45 AM 2.68 (H) 0.61 - 1.24 mg/dL Final  11/27/2021 03:00 AM 3.06 (H) 0.61 - 1.24 mg/dL Final  11/26/2021 04:10 AM 3.04 (H) 0.61 - 1.24 mg/dL Final  11/25/2021 04:25 AM 2.84 (H) 0.61 - 1.24 mg/dL Final  11/24/2021 04:40 AM 2.66 (H) 0.61 - 1.24 mg/dL Final  11/23/2021 05:10 AM 2.79 (H) 0.61 - 1.24 mg/dL Final  11/22/2021 04:43 AM 2.69 (H) 0.61 - 1.24 mg/dL Final  11/21/2021 04:10 AM 3.29 (H) 0.61 - 1.24 mg/dL Final  11/20/2021 04:28 AM 3.17 (H) 0.61 - 1.24 mg/dL Final  11/19/2021 04:04 AM 3.15 (H) 0.61 - 1.24 mg/dL Final  11/18/2021 04:24 AM 3.20 (H) 0.61 - 1.24 mg/dL Final  11/17/2021 11:55 AM 3.46 (H) 0.61 - 1.24 mg/dL Final  11/08/2021 09:42 AM 2.53 (H) 0.76 - 1.27 mg/dL Final  09/14/2021 08:17 AM 2.12 (H) 0.76 - 1.27 mg/dL Final  06/09/2021 11:40 AM 1.79 (H) 0.76 - 1.27 mg/dL Final  01/31/2021 08:35 AM 1.77 (H) 0.76 - 1.27 mg/dL Final  10/31/2020 08:08 AM 1.70 (H) 0.76 - 1.27 mg/dL Final    Comment:                   **Effective November 07, 2020 [facility] will begin**                  reporting the 2021 CKD-EPI creatinine equation that                  estimates kidney function without a race variable.   07/29/2020 11:46 AM 1.80 (H) 0.76 - 1.27 mg/dL Final  07/12/2020 02:33 PM 1.57 (H) 0.76 - 1.27 mg/dL Final  07/01/2020 01:35 AM 2.44 (H) 0.61 - 1.24 mg/dL Final    Comment:    DELTA CHECK NOTED  06/30/2020 01:28 AM 3.80 (H) 0.61 - 1.24 mg/dL Final  06/29/2020 08:45 AM 4.29 (H) 0.61 - 1.24 mg/dL Final  06/28/2020 05:59 AM 4.75 (H) 0.61 - 1.24 mg/dL Final  06/27/2020 02:50 AM 3.83 (H) 0.61 - 1.24 mg/dL Final     Comment:    DELTA CHECK NOTED  06/26/2020 02:26 AM 2.80 (H) 0.61 - 1.24 mg/dL Final    Comment:    DELTA CHECK NOTED  06/25/2020 06:32 AM 1.68 (H) 0.61 - 1.24 mg/dL Final  06/24/2020 02:06 AM 1.61 (H) 0.61 - 1.24 mg/dL Final  06/23/2020 01:35 AM 1.54 (H) 0.61 - 1.24 mg/dL Final  06/22/2020 06:18 AM 1.37 (H) 0.61 - 1.24 mg/dL Final  06/21/2020 10:10 AM 1.44 (H) 0.61 -  1.24 mg/dL Final  06/20/2020 12:07 PM 1.51 (H) 0.61 - 1.24 mg/dL Final  05/24/2020 01:49 PM 1.48 (H) 0.76 - 1.27 mg/dL Final  05/19/2020 08:11 AM 1.33 (H) 0.76 - 1.27 mg/dL Final  04/20/2020 03:18 PM 1.41 (H) 0.76 - 1.27 mg/dL Final  03/10/2020 08:12 AM 1.39 (H) 0.76 - 1.27 mg/dL Final  02/15/2020 08:13 AM 1.30 (H) 0.76 - 1.27 mg/dL Final  02/10/2020 01:14 PM 1.67 (H) 0.76 - 1.27 mg/dL Final  02/09/2020 08:02 AM 1.50 (H) 0.76 - 1.27 mg/dL Final  10/26/2019 08:01 AM 1.20 0.76 - 1.27 mg/dL Final  07/23/2019 08:03 AM 1.39 (H) 0.76 - 1.27 mg/dL Final   Recent Labs  Lab 02/19/22 0848 02/22/22 1427 02/23/22 0453  NA 135 135 137  K 3.7 3.3* 3.6  CL 94* 93* 94*  CO2 '24 25 27  '$ GLUCOSE 208* 231* 181*  BUN 134* 129* 133*  CREATININE 3.37* 4.04* 4.15*  CALCIUM 9.1 9.7 8.7*  PHOS  --   --  4.5   Recent Labs  Lab 02/22/22 1427 02/23/22 0453  WBC 4.6 3.8*  NEUTROABS  --  2.7  HGB 12.6* 12.0*  HCT 40.1 38.4*  MCV 86.2 87.5  PLT 170 148*   Liver Function Tests: Recent Labs  Lab 02/22/22 1427 02/23/22 0453  AST 27 25  ALT 13 16  ALKPHOS 323* 304*  BILITOT 2.1* 2.0*  PROT 6.6 6.0*  ALBUMIN 3.4* 2.7*   No results for input(s): "LIPASE", "AMYLASE" in the last 168 hours. No results for input(s): "AMMONIA" in the last 168 hours. Cardiac Enzymes: No results for input(s): "CKTOTAL", "CKMB", "CKMBINDEX", "TROPONINI" in the last 168 hours. Iron Studies: No results for input(s): "IRON", "TIBC", "TRANSFERRIN", "FERRITIN" in the last 72 hours. PT/INR: '@LABRCNTIP'$ (inr:5)  Xrays/Other Studies: ) Results for orders  placed or performed during the hospital encounter of 02/22/22 (from the past 48 hour(s))  Brain natriuretic peptide     Status: Abnormal   Collection Time: 02/22/22  2:26 PM  Result Value Ref Range   B Natriuretic Peptide 3,004.6 (H) 0.0 - 100.0 pg/mL    Comment: Performed at [facility], [place], [place] [address]  Comprehensive metabolic panel     Status: Abnormal   Collection Time: 02/22/22  2:27 PM  Result Value Ref Range   Sodium 135 135 - 145 mmol/L   Potassium 3.3 (L) 3.5 - 5.1 mmol/L   Chloride 93 (L) 98 - 111 mmol/L   CO2 25 22 - 32 mmol/L   Glucose, Bld 231 (H) 70 - 99 mg/dL    Comment: Glucose reference range applies only to samples taken after fasting for at least 8 hours.   BUN 129 (H) 8 - 23 mg/dL   Creatinine, Ser 4.04 (H) 0.61 - 1.24 mg/dL   Calcium 9.7 8.9 - 10.3 mg/dL   Total Protein 6.6 6.5 - 8.1 g/dL   Albumin 3.4 (L) 3.5 - 5.0 g/dL   AST 27 15 - 41 U/L   ALT 13 0 - 44 U/L   Alkaline Phosphatase 323 (H) 38 - 126 U/L   Total Bilirubin 2.1 (H) 0.3 - 1.2 mg/dL   GFR, Estimated 15 (L) >60 mL/min    Comment: (NOTE) Calculated using the CKD-EPI Creatinine Equation (2021)    Anion gap 17 (H) 5 - 15    Comment: Performed at [facility], [address] [place], [place] [address]  CBC     Status: Abnormal   Collection Time: 02/22/22  2:27 PM  Result Value Ref Range   WBC 4.6 4.0 - 10.5 K/uL   RBC 4.65 4.22 - 5.81 MIL/uL   Hemoglobin 12.6 (L) 13.0 - 17.0 g/dL   HCT 40.1 39.0 - 52.0 %   MCV 86.2 80.0 - 100.0 fL   MCH 27.1 26.0 - 34.0 pg   MCHC 31.4 30.0 - 36.0 g/dL   RDW 17.6 (H) 11.5 - 15.5 %   Platelets 170 150 - 400 K/uL   nRBC 0.0 0.0 - 0.2 %    Comment: Performed at [facility], [place], [place] [address]  Troponin I (High Sensitivity)     Status: Abnormal   Collection Time: 02/22/22  2:27 PM  Result Value Ref Range   Troponin I (High Sensitivity) 54 (H) <18 ng/L     Comment: (NOTE) Elevated high sensitivity troponin I (hsTnI) values and significant  changes across serial measurements may suggest ACS but many other  chronic and acute conditions are known to elevate hsTnI results.  Refer to the "Links" section for chest pain algorithms and additional  guidance. Performed at [facility], [address] [place], [place] [address]   Protime-INR     Status: Abnormal   Collection Time: 02/22/22  2:27 PM  Result Value Ref Range   Prothrombin Time 47.5 (H) 11.4 - 15.2 seconds   INR 5.2 (HH) 0.8 - 1.2    Comment: REPEATED TO VERIFY CRITICAL RESULT CALLED TO, READ BACK BY AND VERIFIED WITH: Celso Sickle 1522 02/22/2022 DBRADLEY Performed at [facility], [address] [place], [place] [address]   Urinalysis, Routine w reflex microscopic Urine, Unspecified Source     Status: Abnormal   Collection Time: 02/22/22  3:32 PM  Result Value Ref Range   Color, Urine YELLOW YELLOW   APPearance HAZY (A) CLEAR   Specific Gravity, Urine 1.013 1.005 - 1.030   pH 5.0 5.0 - 8.0   Glucose, UA NEGATIVE NEGATIVE mg/dL   Hgb urine dipstick NEGATIVE NEGATIVE   Bilirubin Urine NEGATIVE NEGATIVE   Ketones, ur NEGATIVE NEGATIVE mg/dL   Protein, ur 30 (A) NEGATIVE mg/dL   Nitrite NEGATIVE NEGATIVE   Leukocytes,Ua NEGATIVE NEGATIVE   RBC / HPF 0-5 0 - 5 RBC/hpf   WBC, UA 0-5 0 - 5 WBC/hpf   Squamous Epithelial / LPF 0-5 0 - 5   Hyaline Casts, UA PRESENT     Comment: Performed at [facility], [address], [place], [place] [address]  Troponin I (High Sensitivity)     Status: Abnormal   Collection Time: 02/22/22  4:33 PM  Result Value Ref Range   Troponin I (High Sensitivity) 61 (H) <18 ng/L    Comment: (NOTE) Elevated high sensitivity troponin I (hsTnI) values and significant  changes across serial measurements may suggest ACS but many other  chronic and acute conditions are known to elevate hsTnI  results.  Refer to the "Links" section for chest pain algorithms and additional  guidance. Performed at [facility], [address] [place], [place] [address]   Magnesium     Status: Abnormal   Collection Time: 02/22/22  4:33 PM  Result Value Ref Range   Magnesium 2.5 (H) 1.7 - 2.4 mg/dL    Comment: Performed at [facility], [address], [place], [place] [address]  CBG monitoring, ED     Status: Abnormal   Collection Time: 02/22/22  6:56 PM  Result Value Ref Range   Glucose-Capillary  190 (H) 70 - 99 mg/dL    Comment: Glucose reference range applies only to samples taken after fasting for at least 8 hours.  Glucose, capillary     Status: Abnormal   Collection Time: 02/22/22 11:41 PM  Result Value Ref Range   Glucose-Capillary 202 (H) 70 - 99 mg/dL    Comment: Glucose reference range applies only to samples taken after fasting for at least 8 hours.  CBC with Differential/Platelet     Status: Abnormal   Collection Time: 02/23/22  4:53 AM  Result Value Ref Range   WBC 3.8 (L) 4.0 - 10.5 K/uL   RBC 4.39 4.22 - 5.81 MIL/uL   Hemoglobin 12.0 (L) 13.0 - 17.0 g/dL   HCT 38.4 (L) 39.0 - 52.0 %   MCV 87.5 80.0 - 100.0 fL   MCH 27.3 26.0 - 34.0 pg   MCHC 31.3 30.0 - 36.0 g/dL   RDW 17.4 (H) 11.5 - 15.5 %   Platelets 148 (L) 150 - 400 K/uL   nRBC 0.0 0.0 - 0.2 %   Neutrophils Relative % 70 %   Neutro Abs 2.7 1.7 - 7.7 K/uL   Lymphocytes Relative 11 %   Lymphs Abs 0.4 (L) 0.7 - 4.0 K/uL   Monocytes Relative 14 %   Monocytes Absolute 0.5 0.1 - 1.0 K/uL   Eosinophils Relative 3 %   Eosinophils Absolute 0.1 0.0 - 0.5 K/uL   Basophils Relative 1 %   Basophils Absolute 0.0 0.0 - 0.1 K/uL   Immature Granulocytes 1 %   Abs Immature Granulocytes 0.02 0.00 - 0.07 K/uL    Comment: Performed at [facility], [place] [address] [place], [place] [address]  Comprehensive metabolic panel     Status: Abnormal   Collection Time: 02/23/22  4:53 AM   Result Value Ref Range   Sodium 137 135 - 145 mmol/L   Potassium 3.6 3.5 - 5.1 mmol/L   Chloride 94 (L) 98 - 111 mmol/L   CO2 27 22 - 32 mmol/L   Glucose, Bld 181 (H) 70 - 99 mg/dL    Comment: Glucose reference range applies only to samples taken after fasting for at least 8 hours.   BUN 133 (H) 8 - 23 mg/dL   Creatinine, Ser 4.15 (H) 0.61 - 1.24 mg/dL   Calcium 8.7 (L) 8.9 - 10.3 mg/dL   Total Protein 6.0 (L) 6.5 - 8.1 g/dL   Albumin 2.7 (L) 3.5 - 5.0 g/dL   AST 25 15 - 41 U/L   ALT 16 0 - 44 U/L   Alkaline Phosphatase 304 (H) 38 - 126 U/L   Total Bilirubin 2.0 (H) 0.3 - 1.2 mg/dL   GFR, Estimated 15 (L) >60 mL/min    Comment: (NOTE) Calculated using the CKD-EPI Creatinine Equation (2021)    Anion gap 16 (H) 5 - 15    Comment: Performed at [facility], [place] [address] [place], [place] [address]  Magnesium     Status: Abnormal   Collection Time: 02/23/22  4:53 AM  Result Value Ref Range   Magnesium 2.6 (H) 1.7 - 2.4 mg/dL    Comment: Performed at [facility] [address], [place], [place] [address]  Phosphorus     Status: None   Collection Time: 02/23/22  4:53 AM  Result Value Ref Range   Phosphorus 4.5 2.5 - 4.6 mg/dL    Comment: Performed at [facility] [address] [place], [place]  [address]  Protime-INR     Status: Abnormal   Collection Time: 02/23/22  4:53 AM  Result Value Ref Range   Prothrombin Time 46.7 (H) 11.4 - 15.2 seconds   INR 5.1 (HH) 0.8 - 1.2    Comment: REPEATED TO VERIFY CRITICAL RESULT CALLED TO, READ BACK BY AND VERIFIED WITH: Beola Cord, RN AT 0530 ON6/16/23 BY HAYLEE HOWARD Performed at [facility], [place] [address], [place], [place] [address]   Glucose, capillary     Status: Abnormal   Collection Time: 02/23/22  5:07 AM  Result Value Ref Range   Glucose-Capillary 178 (H) 70 - 99 mg/dL    Comment: Glucose reference range applies only to samples taken after fasting for at least 8 hours.   Comment 1 Notify RN     Comment 2 Document in Chart   Glucose, capillary     Status: Abnormal   Collection Time: 02/23/22 11:55 AM  Result Value Ref Range   Glucose-Capillary 169 (H) 70 - 99 mg/dL    Comment: Glucose reference range applies only to samples taken after fasting for at least 8 hours.  Glucose, capillary     Status: Abnormal   Collection Time: 02/23/22  4:30 PM  Result Value Ref Range   Glucose-Capillary 187 (H) 70 - 99 mg/dL    Comment: Glucose reference range applies only to samples taken after fasting for at least 8 hours.   US RENAL  Result Date: 02/23/2022 CLINICAL DATA:  AKI. EXAM: RENAL / URINARY TRACT ULTRASOUND COMPLETE COMPARISON:  None Available. FINDINGS: Right Kidney: Renal length: 12 cm. Echogenicity within normal limits. No mass or hydronephrosis visualized. Left Kidney: Renal length: 10.2 cm. Echogenicity within normal limits. No mass or hydronephrosis visualized. Bladder: Appears normal for degree of bladder distention. Three dural jets not visualized Other: Incidental note of ascites. IMPRESSION: 1. No sonographic evidence of acute renal abnormality. 2. Incidental note of ascites. Electronically Signed   By: Margaretha Sheffield M.D.   On: 02/23/2022 12:59   CT Head Wo Contrast  Result Date: 02/22/2022 CLINICAL DATA:  Provided history: Mental status change, unknown cause. EXAM: CT HEAD WITHOUT CONTRAST TECHNIQUE: Contiguous axial images were obtained from the base of the skull through the vertex without intravenous contrast. RADIATION DOSE REDUCTION: This exam was performed according to the departmental dose-optimization program which includes automated exposure control, adjustment of the mA and/or kV according to patient size and/or use of iterative reconstruction technique. COMPARISON:  Prior head CT 09/29/2011. FINDINGS: Brain: Mild-to-moderate cerebral atrophy. Mild cerebellar atrophy. Small to moderate-sized chronic cortical/subcortical infarct within the mid right frontal lobe and right  insula (MCA territory). Suspected small chronic cortical/subcortical infarct within the high right parietal lobe, new from the prior head CT of 09/29/2011 (series 2, images 22-26). There is no acute intracranial hemorrhage. No acute demarcated cortical infarct. No extra-axial fluid collection. No evidence of an intracranial mass. No midline shift. Vascular: No hyperdense vessel. Atherosclerotic calcifications. Skull: No fracture or aggressive osseous lesion. Sinuses/Orbits: No mass or acute finding within the imaged orbits. No significant paranasal sinus disease at the imaged levels. IMPRESSION: No evidence of acute intracranial abnormality. 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 Suspected small chronic cortical/subcortical infarct within the high right parietal lobe, new from the prior head CT of 09/29/2011 (MCA/ACA watershed territory). 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 Mild-to-moderate cerebral atrophy. Mild cerebellar atrophy. Electronically Signed   By: Marylyn Ishihara  Armandina Gemma D.O.   On: 02/22/2022 14:56   DG Chest Port 1 View  Result Date: 02/22/2022 CLINICAL DATA:  weakness EXAM: PORTABLE CHEST 1 VIEW COMPARISON:  December 13, 2021 FINDINGS: Again seen are the sternotomy wires, CABG changes and mitral valve replacement changes. Stable right subclavian single lead pacemaker defibrillator. Cardiomegaly. Bilateral pleural effusion greater on the left and is new. Mild central pulmonary vascular congestion. No focal consolidation. The visualized skeletal structures are unremarkable. IMPRESSION: Bilateral small pleural effusion greater on the left and is new. Cardiomegaly and mild central pulmonary vascular congestion. Electronically Signed   By: Frazier Richards M.D.   On: 02/22/2022 14:47    PMH:   Past Medical History:  Diagnosis Date   Abnormal nuclear stress test 05/10/2017   Allergy     Anemia    "lost blood w/the cancer"   Arthritis    back    Atrial fibrillation (HCC)    takes Coumadin daily   Bruises easily    takes Coumadin daily   CAD (coronary artery disease)    a. s/p CABG 11/2000 (L-LAD, S-RCA);  b. Lexiscan Myoview 11/12: EF 45%, ischemia and possibly some scar at the base of the inferolateral wall;   c. 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 s/p mechanical (St. Jude) MVR 2002;  b. Echo 5/11: Mild LVH, EF 45-50%, mild AS, mild AI, mean gradient 11 mmHg, MVR with normal gradients, moderate LAE, mild RAE;  c.  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   SOB (shortness of breath)    with exertion   Type II diabetes mellitus ([place])    takes Januvia,Glipizide,and Metformin daily as well as Lantus   Unstable angina ([place]) 06/21/2020    PSH:   Past Surgical History:  Procedure Laterality Date   APPLICATION OF WOUND VAC  2014   CARDIAC CATHETERIZATION  2002   COLONOSCOPY     CORONARY ARTERY BYPASS GRAFT  2002   x 3   CORONARY ARTERY BYPASS GRAFT     CORONARY ATHERECTOMY N/A 06/24/2020    Procedure: CORONARY ATHERECTOMY;  Surgeon: [name], MD;  Location: [facility];  Service: Cardiovascular;  Laterality: N/A;   CORONARY BALLOON ANGIOPLASTY N/A 06/24/2020   Procedure: CORONARY BALLOON ANGIOPLASTY;  Surgeon: [name], MD;  Location: [facility];  Service: Cardiovascular;  Laterality: N/A;   CORONARY STENT  INTERVENTION N/A 06/24/2020   Procedure: CORONARY STENT INTERVENTION;  Surgeon: [name], MD;  Location: [facility];  Service: Cardiovascular;  Laterality: N/A;   ESOPHAGOGASTRODUODENOSCOPY     HERNIA REPAIR     ICD IMPLANT N/A 02/19/2020   Procedure: ICD IMPLANT;  Surgeon: Evans Lance, MD;  Location: [facility];  Service: Cardiovascular;  Laterality: N/A;   INCISIONAL HERNIA REPAIR N/A 03/15/2014   Procedure: LAPAROSCOPIC INCISIONAL HERNIA;  Surgeon: Harl Bowie, MD;  Location: [place];  Service: General;  Laterality: N/A;   INSERTION OF MESH N/A 03/15/2014   Procedure: INSERTION OF MESH;  Surgeon: Harl Bowie, MD;  Location: [facility];  Service: General;  Laterality: N/A;   LAPAROSCOPIC INCISIONAL / UMBILICAL / [place]  03/15/2014   IHR   LAPAROTOMY N/A 02/09/2013   Procedure: EXPLORATORY LAPAROTOMY WITH incisional biopsy intra- ABDOMINAL MASS ILOCECTOMY ;  Surgeon: Harl Bowie, MD;  Location: [facility];  Service: General;  Laterality: N/A;   MITRAL VALVE REPLACEMENT  11/2000   #33 St. Jude mechanical mitral valve prosthesis. Dr. Servando Snare   [facility] REMOVAL  03/15/2014   PORT-A-CATH REMOVAL Left 03/15/2014   Procedure: REMOVAL PORT-A-CATH;  Surgeon: Harl Bowie, MD;  Location: [facility];  Service: General;  Laterality: Left;   PORTACATH PLACEMENT N/A 02/09/2013   Procedure: INSERTION PORT-A-CATH;  Surgeon: Harl Bowie, MD;  Location: [facility];  Service: General;  Laterality: N/A;   RIGHT AND LEFT HEART CATH N/A 11/27/2021   Procedure: RIGHT AND LEFT HEART CATH;  Surgeon: Larey Dresser, MD;   Location: [facility];  Service: Cardiovascular;  Laterality: N/A;   RIGHT/LEFT HEART CATH AND CORONARY/GRAFT ANGIOGRAPHY N/A 05/10/2017   Procedure: RIGHT/LEFT HEART CATH AND CORONARY/GRAFT ANGIOGRAPHY;  Surgeon: Martinique, Peter M, MD;  Location: [facility];  Service: Cardiovascular;  Laterality: N/A;   RIGHT/LEFT HEART CATH AND CORONARY/GRAFT ANGIOGRAPHY N/A 06/23/2020   Procedure: RIGHT/LEFT HEART CATH AND CORONARY/GRAFT ANGIOGRAPHY;  Surgeon: Troy Sine, MD;  Location: [facility];  Service: Cardiovascular;  Laterality: N/A;   SEPTOPLASTY     SMALL INTESTINE SURGERY     TEE WITHOUT CARDIOVERSION N/A 11/21/2021   Procedure: TRANSESOPHAGEAL ECHOCARDIOGRAM (TEE);  Surgeon: Larey Dresser, MD;  Location: [facility];  Service: Cardiovascular;  Laterality: N/A;    Allergies:  Allergies  Allergen Reactions   Allopurinol Shortness Of Breath   Doxycycline Hives   Farxiga [Dapagliflozin]     Can not tolerate - dizzy weak almost passed out    Medications:   Prior to Admission medications   Medication Sig Start Date End Date Taking? Authorizing Provider  clopidogrel (PLAVIX) 75 MG tablet TAKE 1 TABLET BY MOUTH DAILY WITH BREAKFAST. 04/17/21  Yes Furth, Cadence H, PA-C  glipiZIDE (GLUCOTROL XL) 10 MG 24 hr tablet Take 1 tablet (10 mg total) by mouth daily. 06/09/21  Yes Stacks, Cletus Gash, MD  insulin glargine (LANTUS) 100 UNIT/ML injection Inject 0.7 mLs (70 Units total) into the skin at bedtime. Patient taking differently: Inject 70 Units into the skin. Take 70 units if over 150 daily 07/01/20  Yes Furth, Cadence H, PA-C  JANUVIA 50 MG tablet Take 0.5 tablets (25 mg total) by mouth daily. 12/02/21  Yes Kroeger, Daleen Snook M., PA-C  pantoprazole (PROTONIX) 40 MG tablet Take 1 tablet (40 mg total) by mouth daily. 06/09/21  Yes Stacks, Cletus Gash, MD  rosuvastatin (CRESTOR) 20 MG tablet 20 mg. Patient takes 1 tablet by  mouth every other day.   Yes [provider]  sodium  bicarbonate 650 MG tablet Take 650 mg by mouth 2 (two) times daily.   Yes [provider]  torsemide (DEMADEX) 20 MG tablet Take 40 mg by mouth 2 (two) times daily. If weight increases, increase to 60 mg bid   Yes [provider]  warfarin (COUMADIN) 5 MG tablet Take 1 tablet (5 mg total) by mouth daily. Patient taking differently: Take 5 mg by mouth daily. Alternates 2.5 x2 days then '5mg'$  alternating 04/03/21  Yes Claretta Fraise, MD  FUROSCIX 80 MG/10ML CTKT Inject into the skin. Patient not taking: Reported on 02/22/2022 01/24/22   [provider]  nitroGLYCERIN (NITROSTAT) 0.4 MG SL tablet Place 1 tablet (0.4 mg total) under the tongue every 5 (five) minutes as needed for chest pain. 01/15/22   Minus Breeding, MD  OneTouch Delica Lancets 25E MISC TEST 2 TIMES DAILY AS DIRECTED 02/24/21   Claretta Fraise, MD  [facility] VERIO test strip USE TO TEST TWICE A DAY 10/27/21   Claretta Fraise, MD    Discontinued Meds:  There are no discontinued medications.  Social History:  reports that he has quit smoking. His smoking use included cigars. He has never used smokeless tobacco. He reports that he does not drink alcohol and does not use drugs.  Family History:   Family History  Problem Relation Age of Onset   Diabetes Mother    Hypertension Father    Thyroid disease Sister    Diabetes Brother    Arthritis Brother    Depression Brother    Heart disease Brother    Hyperlipidemia Brother    Hypertension Brother    Hypertension Brother    Diabetes Brother    Heart disease Brother    Hyperlipidemia Brother     Blood pressure 94/64, pulse 94, temperature 97.7 F (36.5 C), temperature source Oral, resp. rate 18, height '5\' 11"'$  (1.803 m), weight 84.7 kg, SpO2 100 %. General: NAD, not tachypneic, pleasant HEENT: [place]/AT, moist mucosal membranes Respiratory: fine crackles bibasilar CV: S1S2, irreg irreg, no murmur/rubs/gallops, JVD present to 8cm Abdomen: soft, nontender,  nondistended, normal bowel sounds, no palpable mass Extremities: 2 pitting edema b/l LE's Skin: no rash, good turgor Neuro: no focal deficits, speech clear and coherent, moving all ext spontaneously but confused  GU: no foley  Back: no flank tenderness to palpation       Zyrion Coey, Hunt Oris, MD 02/23/2022, 6:31 PM

## 2022-02-24 DIAGNOSIS — I099 Rheumatic heart disease, unspecified: Secondary | ICD-10-CM | POA: Diagnosis present

## 2022-02-24 DIAGNOSIS — E785 Hyperlipidemia, unspecified: Secondary | ICD-10-CM

## 2022-02-24 DIAGNOSIS — I482 Chronic atrial fibrillation, unspecified: Secondary | ICD-10-CM | POA: Diagnosis not present

## 2022-02-24 DIAGNOSIS — K219 Gastro-esophageal reflux disease without esophagitis: Secondary | ICD-10-CM | POA: Diagnosis not present

## 2022-02-24 DIAGNOSIS — E1169 Type 2 diabetes mellitus with other specified complication: Secondary | ICD-10-CM

## 2022-02-24 DIAGNOSIS — I519 Heart disease, unspecified: Secondary | ICD-10-CM | POA: Diagnosis not present

## 2022-02-24 DIAGNOSIS — N179 Acute kidney failure, unspecified: Secondary | ICD-10-CM | POA: Diagnosis not present

## 2022-02-24 LAB — GLUCOSE, CAPILLARY
Glucose-Capillary: 97 mg/dL (ref 70–99)
Glucose-Capillary: 156 mg/dL — ABNORMAL HIGH (ref 70–99)
Glucose-Capillary: 198 mg/dL — ABNORMAL HIGH (ref 70–99)
Glucose-Capillary: 189 mg/dL — ABNORMAL HIGH (ref 70–99)

## 2022-02-24 LAB — PROTIME-INR
INR: 4.1 (ref 0.8–1.2)
Prothrombin Time: 39.3 seconds — ABNORMAL HIGH (ref 11.4–15.2)

## 2022-02-24 MED ORDER — MIDODRINE HCL 5 MG PO TABS
10.0000 mg | ORAL_TABLET | Freq: Three times a day (TID) | ORAL | Status: DC
  Administered 2022-02-24 – 2022-02-26 (×6): 10 mg via ORAL
  Filled 2022-02-24 (×6): qty 2

## 2022-02-24 NOTE — Progress Notes (Signed)
Pt got up to use urinal, felt dizzy and flushed BP 78/55. After few minutes BP rechecked BP 98/69. Asymptomatic. Wife at bedside. Advised to call when alone due to low BP and safety. MD aware

## 2022-02-24 NOTE — Hospital Course (Addendum)
71 year old man presenting with confusion, hypotension. Treated for acute on chronic CHF, low output with failure to respond to inotrope and Lasix.  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  Given failure to improve, cardiology nephrology recommended palliative intervention. Patient now full comfort care. Will assess how patient to responds being off infusions.

## 2022-02-24 NOTE — Progress Notes (Addendum)
Progress Note   Patient: John [name]:[id number] DOB: Apr 28, 1951 DOA: 02/22/2022     2 DOS: the patient was seen and examined on 02/24/2022   Brief hospital course: John [name] was admitted to the hospital with the working diagnosis of worsening renal function.   71 yo male with the past medical history of heart failure with reduced systolic function, CAD, mechanical mitral valve replacement, aortic valve stenosis, rheumatic fever, coronary artery disease, CKD stage 3b, atrial fibrillation, and T2DM who presented with altered mental status. Patient was noted to be confused for 3 weeks. Because worsening symptoms his wife brought him to the hospital. Per his wife his blood pressure has been 80 to 90 systolic over last 2 weeks.    On his initial physical examination his blood pressure was 97/63, HR 87, RR 18 and 02 saturation 97% on 2 L per [place], lungs with no wheezing, heart with S1 and S2 present and rhythmic, abdomen not distended, lower extremities with wraps in place.   Na 135, K 3,3 Cl 93, bicarbonate 25, glucose 231, bun 129 cr 4,0 BNP 3,004 High sensitive troponin 54  Wbc 4,6 hgb 12,6 plt 170  INR 5,2   Head CT with no acute changes, small to moderate size chronic cortical and subcortical infarcts within the mid right frontal lobe and right insula.  Small chronic cortical and subcortical infarct within the high right parietal lobe. Chronic lacunar infarcts within the left thalamus.   Chest radiograph with cardiomegaly, bilateral hilar vascular congestion, bilateral small pleural effusions.  Pacer defibrillator with one ventricular lead  EKG 104 bpm, right axis deviation, right bundle branch block, qtc prolongation, atrial fibrillation rhythm with no significant ST segment  or T wave changes.   Patient with hypotension.    Assessment and Plan: * Chronic systolic dysfunction of left ventricle 11/2021 echocardiogram with reduced LV systolic function down to 20 to 25%. Severe  reduction in RV systolic function. Low gradient aortic stenosis.   Patient continue to be volume overloaded.  His blood pressure has been low with systolic 90 to 96 with diastolic 56 to 60, with MAP more than 65 mmHg.  Positive JVD and rales on lung examination. Oxymetry is 100% on room air.  Lower extremities are warm at my examination.  He continue in atrial fibrillation rhythm.   Holding diuretic therapy due to risk of hypotension.  Plan to add midodrine today, and follow response. Patient may need further inotropic support.  Plan to consult heart failure team.  Hold on diuretic therapy for now.  If worsening hemodynamics, plan to transfer to ICU for vasopressor therapy with norepinephrine.  Upgrade patient to progressive care.   Patient with poor prognosis, code status was addressed and he confirms full code for now.  Will order repeat limited echocardiogram.   AKI (acute kidney injury) ([place]) Patient with worsening renal function, serum cr at 4.15 with K at 3,6 and serum bicarbonate at 27, with BUN 133.  Continue to hold on diuretic therapy for now. Continue blood pressure monitoring and add midodrine.   Chronic atrial fibrillation (HCC) Persistent atrial fibrillation, HR in the 90 range.  Holding AV blockade for for now.  INR is supra therapeutic at 4.1    Gastroesophageal reflux disease - Continue PPI.  HTN (hypertension) Hypotension. Plan to add midodrine, if no improvement will need further escalation of care.  Rheumatic heart disease Patient sp mechanical mitral valve replacement, on warfarin therapy. INR is elevated supra therapeutic Aortic stenosis with low  gradient.   Type 2 diabetes mellitus with hyperlipidemia ([place]) Hold on basal insulin due to low GFR. Continue with insulin sliding scale for glucose cover and monitoring.  Continue with statin therapy.         Subjective: Patient with occasional dyspnea but not chest pain,   Physical Exam: Vitals:    02/24/22 0907 02/24/22 1400 02/24/22 1458 02/24/22 1500  BP: 96/60 (!) '78/55 98/69 98/69 '$  Pulse: 98     Resp: 20  20   Temp:      TempSrc:      SpO2: 100% 100% 100%   Weight:      Height:       Neurology awake and alert ENT with mild pallor Cardiovascular with S1 and S2 present irregularly irregular, positive murmur at the apex, no gallops Positive JVD Positive lower extremity edema, warm extremities Respiratory with no rales or rhonchi Abdomen not distended  Data Reviewed:    Family Communication: I spoke with patient's wofe at the bedside, we talked in detail about patient's condition, plan of care and prognosis and all questions were addressed.   Disposition: Status is: Inpatient Remains inpatient appropriate because: heart failure   Planned Discharge Destination: Home      Author: Tawni Millers, MD 02/24/2022 5:47 PM  For on call review [email or web address].

## 2022-02-24 NOTE — Progress Notes (Signed)
PT Cancellation Note  Patient Details Name: John [name] MRN: [id number] DOB: March 27, 1951   Cancelled Treatment:    Reason Eval/Treat Not Completed: Medical issues which prohibited therapy. Pt supine in bed and wife reports he had gotten very dizzy when trying to get up. BP 78/55. Ambulation deferred.   Shary Decamp [facility] 02/24/2022, 3:08 PM [place] Office [phone]

## 2022-02-24 NOTE — Progress Notes (Signed)
TRH floor coverage for both [facility] and [facility] (remote) on night of 02/23/22 into morning of 02/24/22:    I was notified by RN of the patient's blood pressure of 82/59, and that the patient is due for a dose of Lasix 80 mg IV.  I subsequently asked that the patient's dose of Lasix be held for now.  Insulin improvement in systolic blood pressures into the 90s, which appears consistent with his blood pressures earlier in the day.      Babs Bertin, DO Hospitalist

## 2022-02-24 NOTE — Progress Notes (Signed)
[facility] Progress Note   71 y.o. male HTN, CASHD s/p CABG x3 2002, mechanical MVR 2002 on Coumadin, St Jude ICD 02/19/2020, aortic valve stenosis, rheumatic fever, PCI in 06/24/2020, atrial fibrillation, HLD, DM, HFrEF 20-25% presenting in confusion a cardiology clinic visit. According to his spouse this has been present and worsening for 3 weeks. In the ED the patient had a new small chronic infarct in the right parietal lobe. He was afebrile and found to be in renal failure with a cr of 4.04 and BNP 3004.  Patient had been on torsemide '60mg'$  BID 01/31/2022 at cardiology clinic visit.   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 Urinalysis shows minimal proteinuria and does not display an active sediment. Renal function appears to be progressively worse since 01/31/22 which frequent variation. Ultrasound 6/16 neg for obstructive uropathy. AKI likely from [place]. - He appears to be overloaded with a JVD, lower extremity edema and weight is ~84.9 kg. His weight was 81kg at the cardiology appt in late May 2023. Also his wife said his appetite has not been good overall.  - Would like diurese with Metolazone and Lasix; appears he may need an inotrope as he was hypotensive overnight. He actually did not receive the Metolazone or Lasix bec of hypotension.  - May need heart failure team for assistance; may need milrinone.  -Maintain MAP>65 for optimal renal perfusion.  - Avoid nephrotoxic medications including NSAIDs and iodinated intravenous contrast exposure unless the latter is absolutely indicated.   - Preferred narcotic agents for pain control are hydromorphone, fentanyl, and methadone. Morphine should not be used.  - Avoid Baclofen and avoid oral sodium phosphate and magnesium citrate based laxatives / bowel preps.  - Continue strict Input and Output monitoring. Will monitor the patient closely with  you and intervene or adjust therapy as indicated by changes in clinical status/labs     Encephalopathy - could this be from slowly worsening renal function? CT shows only a small chronic infarct which is new. HFrEF - 59-56% certainly has edema in the lower extremity; currently receiving gentle hydration. DM New infarct in high right parietal lobe which is new. Atrial fibrillation with a supratherapeutic INR Diffuse large cell lymphoma (diagnosed in 2014, status post R-CHOP in 2014, now in remission)  Subjective:   No change; denies dyspnea, fever, chills, nausea.    Objective:   BP 96/60   Pulse 98   Temp 97.6 F (36.4 C) (Oral)   Resp 20   Ht '5\' 11"'$  (1.803 m)   Wt 84.9 kg   SpO2 100%   BMI 26.10 kg/m   Intake/Output Summary (Last 24 hours) at 02/24/2022 1025 Last data filed at 02/24/2022 0910 Gross per 24 hour  Intake 1663.47 ml  Output 550 ml  Net 1113.47 ml   Weight change: 0.268 kg  Physical Exam: General: NAD, not tachypneic, pleasant HEENT: [place]/AT, moist mucosal membranes Respiratory: fine crackles bibasilar CV: S1S2, irreg irreg, no murmur/rubs/gallops, JVD present to 8cm Abdomen: soft, nontender, nondistended, normal bowel sounds, no palpable mass Extremities: 1+ pitting edema b/l LE's Skin: no rash, good turgor Neuro: no focal deficits, moving all ext spontaneously GU: no foley  Back: no flank tenderness to palpation  Imaging: US RENAL  Result Date: 02/23/2022 CLINICAL DATA:  AKI. EXAM: RENAL / URINARY TRACT ULTRASOUND COMPLETE COMPARISON:  None Available. FINDINGS: Right Kidney: Renal length: 12 cm. Echogenicity within normal limits. No  mass or hydronephrosis visualized. Left Kidney: Renal length: 10.2 cm. Echogenicity within normal limits. No mass or hydronephrosis visualized. Bladder: Appears normal for degree of bladder distention. Three dural jets not visualized Other: Incidental note of ascites. IMPRESSION: 1. No sonographic evidence of acute renal  abnormality. 2. Incidental note of ascites. Electronically Signed   By: Margaretha Sheffield M.D.   On: 02/23/2022 12:59   CT Head Wo Contrast  Result Date: 02/22/2022 CLINICAL DATA:  Provided history: Mental status change, unknown cause. EXAM: CT HEAD WITHOUT CONTRAST TECHNIQUE: Contiguous axial images were obtained from the base of the skull through the vertex without intravenous contrast. RADIATION DOSE REDUCTION: This exam was performed according to the departmental dose-optimization program which includes automated exposure control, adjustment of the mA and/or kV according to patient size and/or use of iterative reconstruction technique. COMPARISON:  Prior head CT 09/29/2011. FINDINGS: Brain: Mild-to-moderate cerebral atrophy. Mild cerebellar atrophy. 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 Suspected small chronic cortical/subcortical infarct within the high right parietal lobe, new from the prior head CT of 09/29/2011 (series 2, images 22-26). There is no acute intracranial hemorrhage. No acute demarcated cortical infarct. No extra-axial fluid collection. No evidence of an intracranial mass. No midline shift. Vascular: No hyperdense vessel. Atherosclerotic calcifications. Skull: No fracture or aggressive osseous lesion. Sinuses/Orbits: No mass or acute finding within the imaged orbits. No significant paranasal sinus disease at the imaged levels. IMPRESSION: No evidence of acute intracranial abnormality. 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 Suspected small chronic cortical/subcortical infarct within the high right parietal lobe, new from the prior head CT of 09/29/2011 (MCA/ACA watershed territory). 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 Mild-to-moderate cerebral atrophy. Mild cerebellar  atrophy. Electronically Signed   By: Kellie Simmering D.O.   On: 02/22/2022 14:56   DG Chest Port 1 View  Result Date: 02/22/2022 CLINICAL DATA:  weakness EXAM: PORTABLE CHEST 1 VIEW COMPARISON:  December 13, 2021 FINDINGS: Again seen are the sternotomy wires, CABG changes and mitral valve replacement changes. Stable right subclavian single lead pacemaker defibrillator. Cardiomegaly. Bilateral pleural effusion greater on the left and is new. Mild central pulmonary vascular congestion. No focal consolidation. The visualized skeletal structures are unremarkable. IMPRESSION: Bilateral small pleural effusion greater on the left and is new. Cardiomegaly and mild central pulmonary vascular congestion. Electronically Signed   By: Frazier Richards M.D.   On: 02/22/2022 14:47    Labs: BMET Recent Labs  Lab 02/19/22 0848 02/22/22 1427 02/23/22 0453  NA 135 135 137  K 3.7 3.3* 3.6  CL 94* 93* 94*  CO2 '24 25 27  '$ GLUCOSE 208* 231* 181*  BUN 134* 129* 133*  CREATININE 3.37* 4.04* 4.15*  CALCIUM 9.1 9.7 8.7*  PHOS  --   --  4.5   CBC Recent Labs  Lab 02/22/22 1427 02/23/22 0453  WBC 4.6 3.8*  NEUTROABS  --  2.7  HGB 12.6* 12.0*  HCT 40.1 38.4*  MCV 86.2 87.5  PLT 170 148*    Medications:     furosemide  80 mg Intravenous Once   insulin aspart  0-5 Units Subcutaneous QHS   insulin aspart  0-9 Units Subcutaneous TID WC   metolazone  5 mg Oral Once   pantoprazole  40 mg Oral Daily   rosuvastatin  40 mg Oral QODAY   sodium bicarbonate  650 mg Oral BID  Otelia Santee, MD 02/24/2022, 10:25 AM

## 2022-02-24 NOTE — Progress Notes (Signed)
Dressing changed on BLE by wife at beside,  Stated that they go to wound clinic, she normally does it every other day per wound clinic orders. Wife stated that patient got multiple blisters and now is getting better . Prefers that we do not contact our [place], RN she will continue dressing changes.

## 2022-02-24 NOTE — Assessment & Plan Note (Addendum)
Patient sp mechanical mitral valve replacement, on warfarin therapy. INR is therapeutic Aortic stenosis with low gradient.  Not TAVR candidate (oldr records personally reviewed).

## 2022-02-24 NOTE — Progress Notes (Addendum)
Pt has blisters on BLE as stated per him and wife. Wife changes dressings every other day. Clean and dry dressing. Unable to see blister. Pt refused removing and changing them stating wife will do it.

## 2022-02-24 NOTE — Progress Notes (Signed)
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  Allergies  Allergen Reactions   Allopurinol Shortness Of Breath   Doxycycline Hives   Farxiga [Dapagliflozin]     Can not tolerate - dizzy weak almost passed out    Patient Measurements: Height: '5\' 11"'$  (180.3 cm) Weight: 84.9 kg (187 lb 1.6 oz) IBW/kg (Calculated) : 75.3  Vital Signs: Temp: 97.6 F (36.4 C) (06/17 0401) Temp Source: Oral (06/17 0401) BP: 90/56 (06/17 0401) Pulse Rate: 98 (06/17 0401)  Labs: Recent Labs    02/22/22 1427 02/22/22 1633 02/23/22 0453 02/24/22 0236  HGB 12.6*  --  12.0*  --   HCT 40.1  --  38.4*  --   PLT 170  --  148*  --   LABPROT 47.5*  --  46.7* 39.3*  INR 5.2*  --  5.1* 4.1*  CREATININE 4.04*  --  4.15*  --   TROPONINIHS 54* 61*  --   --      Estimated Creatinine Clearance: 18.1 mL/min (A) (by C-G formula based on SCr of 4.15 mg/dL (H)).   Medical History: Past Medical History:  Diagnosis Date   Abnormal nuclear stress test 05/10/2017   Allergy    Anemia    "lost blood w/the cancer"   Arthritis    back    Atrial fibrillation (HCC)    takes Coumadin daily   Bruises easily    takes Coumadin daily   CAD (coronary artery disease)    a. s/p CABG 11/2000 (L-LAD, S-RCA);  b. Lexiscan Myoview 11/12: EF 45%, ischemia and possibly some scar at the base of the inferolateral wall;   c. 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 s/p mechanical (St. Jude) MVR 2002;  b. Echo 5/11: Mild LVH, EF 45-50%, mild AS, mild AI, mean gradient 11 mmHg, MVR with normal gradients, moderate LAE, mild RAE;  c.  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 Patient had Rheumatic Fever  in 1965 with resultant Rheumatic Heart Disease requiring a MVR in 2002. There was no bleed on CT of the head which was completed on 02/22/2022  His INR is supra-therapeutic at 4.1. Unfortunately vitamin K administration on 6/17 will make INR fall less predictable.  Home dose of coumadin was recently changed [01/2022] to 2.5 mg daily His last dose of coumadin was ingested on 02/21/2022 per patient and significant other report during med rec process.  Goal of Therapy:  INR 2.5-3.5 Monitor platelets by anticoagulation protocol: Yes   Plan:  Coumadin- Hold dose today Daily PT/INR, resume warfarin as INR allows  Thank you for allowing pharmacy to participate in this patient's care.  Reatha Harps, PharmD PGY1 Pharmacy Resident 02/24/2022 8:41 AM Check [email or web address] for unit specific pharmacy number

## 2022-02-25 ENCOUNTER — Encounter (HOSPITAL_COMMUNITY): Payer: Self-pay | Admitting: Internal Medicine

## 2022-02-25 ENCOUNTER — Inpatient Hospital Stay (HOSPITAL_COMMUNITY): Payer: PPO

## 2022-02-25 ENCOUNTER — Other Ambulatory Visit (HOSPITAL_COMMUNITY): Payer: HMO

## 2022-02-25 ENCOUNTER — Inpatient Hospital Stay: Payer: Self-pay

## 2022-02-25 DIAGNOSIS — K219 Gastro-esophageal reflux disease without esophagitis: Secondary | ICD-10-CM | POA: Diagnosis not present

## 2022-02-25 DIAGNOSIS — I519 Heart disease, unspecified: Secondary | ICD-10-CM | POA: Diagnosis not present

## 2022-02-25 DIAGNOSIS — R570 Cardiogenic shock: Secondary | ICD-10-CM

## 2022-02-25 DIAGNOSIS — I5023 Acute on chronic systolic (congestive) heart failure: Secondary | ICD-10-CM | POA: Diagnosis not present

## 2022-02-25 DIAGNOSIS — I482 Chronic atrial fibrillation, unspecified: Secondary | ICD-10-CM | POA: Diagnosis not present

## 2022-02-25 DIAGNOSIS — N179 Acute kidney failure, unspecified: Secondary | ICD-10-CM | POA: Diagnosis not present

## 2022-02-25 LAB — CBC
HCT: 39.8 % (ref 39.0–52.0)
Hemoglobin: 12.8 g/dL — ABNORMAL LOW (ref 13.0–17.0)
MCH: 27.8 pg (ref 26.0–34.0)
MCHC: 32.2 g/dL (ref 30.0–36.0)
MCV: 86.3 fL (ref 80.0–100.0)
Platelets: 164 10*3/uL (ref 150–400)
RBC: 4.61 MIL/uL (ref 4.22–5.81)
RDW: 17.6 % — ABNORMAL HIGH (ref 11.5–15.5)
WBC: 4.2 10*3/uL (ref 4.0–10.5)
nRBC: 0 % (ref 0.0–0.2)

## 2022-02-25 LAB — RENAL FUNCTION PANEL
Albumin: 2.8 g/dL — ABNORMAL LOW (ref 3.5–5.0)
Anion gap: 19 — ABNORMAL HIGH (ref 5–15)
BUN: 141 mg/dL — ABNORMAL HIGH (ref 8–23)
CO2: 21 mmol/L — ABNORMAL LOW (ref 22–32)
Calcium: 8.6 mg/dL — ABNORMAL LOW (ref 8.9–10.3)
Chloride: 95 mmol/L — ABNORMAL LOW (ref 98–111)
Creatinine, Ser: 4.2 mg/dL — ABNORMAL HIGH (ref 0.61–1.24)
GFR, Estimated: 15 mL/min — ABNORMAL LOW (ref >60)
Glucose, Bld: 149 mg/dL — ABNORMAL HIGH (ref 70–99)
Phosphorus: 5.1 mg/dL — ABNORMAL HIGH (ref 2.5–4.6)
Potassium: 3.8 mmol/L (ref 3.5–5.1)
Sodium: 135 mmol/L (ref 135–145)

## 2022-02-25 LAB — COOXEMETRY PANEL
Carboxyhemoglobin: 1.1 % (ref 0.5–1.5)
Methemoglobin: <0.7 % (ref 0.0–1.5)
O2 Saturation: 52.2 %
Total hemoglobin: 12.6 g/dL (ref 12.0–16.0)

## 2022-02-25 LAB — GLUCOSE, CAPILLARY
Glucose-Capillary: 152 mg/dL — ABNORMAL HIGH (ref 70–99)
Glucose-Capillary: 146 mg/dL — ABNORMAL HIGH (ref 70–99)
Glucose-Capillary: 125 mg/dL — ABNORMAL HIGH (ref 70–99)
Glucose-Capillary: 163 mg/dL — ABNORMAL HIGH (ref 70–99)

## 2022-02-25 LAB — AMMONIA: Ammonia: 40 umol/L — ABNORMAL HIGH (ref 9–35)

## 2022-02-25 LAB — PROTIME-INR
INR: 3.4 — ABNORMAL HIGH (ref 0.8–1.2)
Prothrombin Time: 34 seconds — ABNORMAL HIGH (ref 11.4–15.2)

## 2022-02-25 MED ORDER — SODIUM CHLORIDE 0.9% FLUSH
10.0000 mL | Freq: Two times a day (BID) | INTRAVENOUS | Status: DC
  Administered 2022-02-26: 10 mL
  Administered 2022-02-26: 20 mL
  Administered 2022-02-26: 10 mL
  Administered 2022-02-27 – 2022-02-28 (×2): 20 mL
  Administered 2022-03-01 – 2022-03-02 (×2): 10 mL

## 2022-02-25 MED ORDER — CHLORHEXIDINE GLUCONATE CLOTH 2 % EX PADS
6.0000 | MEDICATED_PAD | Freq: Every day | CUTANEOUS | Status: DC
Start: 2022-02-25 — End: 2022-02-28
  Administered 2022-02-25 – 2022-02-28 (×4): 6 via TOPICAL

## 2022-02-25 MED ORDER — WARFARIN - PHARMACIST DOSING INPATIENT: Freq: Every day | Status: DC

## 2022-02-25 MED ORDER — SODIUM CHLORIDE 0.9% FLUSH: 10.0000 mL | INTRAVENOUS | Status: DC | PRN

## 2022-02-25 MED ORDER — MILRINONE LACTATE IN DEXTROSE 20-5 MG/100ML-% IV SOLN
0.3750 ug/kg/min | INTRAVENOUS | Status: DC
  Administered 2022-02-25 – 2022-02-27 (×4): 0.25 ug/kg/min via INTRAVENOUS
  Administered 2022-02-27 – 2022-03-01 (×5): 0.375 ug/kg/min via INTRAVENOUS
  Filled 2022-02-25 (×9): qty 100

## 2022-02-25 MED ORDER — WARFARIN SODIUM 2.5 MG PO TABS
2.5000 mg | ORAL_TABLET | Freq: Once | ORAL | Status: AC
  Administered 2022-02-25: 2.5 mg via ORAL
  Filled 2022-02-25: qty 1

## 2022-02-25 NOTE — Consult Note (Signed)
Cardiology Consultation:   Patient ID: John [name] MRN: [id number]; DOB: 05/13/1951  Admit date: 02/22/2022 Date of Consult: 02/25/2022  PCP:  Claretta Fraise, MD   [facility] Providers Cardiologist:  Minus Breeding, MD  Electrophysiologist:  Cristopher Peru, MD  Advanced Heart Failure:  Loralie Champagne, MD       Patient Profile:   John [name] is a 71 y.o. male with a hx of CAD s/p CABG 2002, DES to Cx 06/2020, mechanical MVR in 2002 in Coumadin, aortic stenosis (not felt to be good TAVR candidate) chronic systolic heart failure, permanent afib, St. 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 Cathlean Sauer.  History of Present Illness:   John [name] has extremely complex PMH and has been followed by Dr. Percival Spanish, Dr. Lovena Le, and the advanced heart failure clinic.  Last PCI was in 06/2020 with orbital atherectomy/DES to Cx. With more recent history this year, he was admitted from office 11/2021 with a/c CHF exacerbation.  Echo showed EF 20-25%, RV severely HK, mechanical MV with mean gradient 6, mod-severe TR, moderate AI, ?low flow/low gradient moderate-severe AS with mean gradient 15/AVA 0.96 cm^2.  TEE  showed EF around 25% but only mildly decreased RV function and trivial TR, normally functioning mechanical MV and abnormal aortic valve with moderate-severe AS and AI. Diuresed with IV lasix, augmented with milrinone. 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 Case was discussed with Structural Heart Team and not felt to be a good TAVR candidate. Milrinone weaned off and GDMT resumed, but limited by CKD. Discharged  home, weight 188 lbs. In 01/2022 he was markedly volume overloaded and required several days of Furoscix as well as abx for suspected cellulitis with referral to [place]. At f/u with CHF 01/31/22, weight was down 13lb (180lb) and appetite was poor, otherwise felt to be clinically stable.. Phone note 02/20/22 references CHF team having to decrease torsemide due to being dry on ICM monitoring, and metoprolol stopped at that time due to hypotension. He was seen in the office 02/22/22 with increasing confusion so was referred to the ER for evaluation. He was subsequently admitted for acute encephalopathy, AKI on CKD 3b, and also found to have new infarct in the high right parietal lobe which is new. He is being seen by IM and nephrology team. Initial BNP 3004,  INR 5.2 (was 1.4 on 5/25), hsTroponin 54->61, with most recent BUN 141, Cr 4.20, albumin 2.8, Hgb 12.8, INR 3.5. Baseline Cr appears in 2.1-3 range most recently. He's been started on midodrine for persistent hypotension. Uremia has been an issue for several months.   Patient currently getting PICC and will be followed up by MD to complete consultation.  Past Medical History:  Diagnosis Date   Allergy    Anemia    "lost blood w/the cancer"   Aortic stenosis    Arthritis    back    CAD (coronary artery disease)    a. s/p CABG 11/2000 (L-LAD, S-RCA);  b. DES to Cx 06/2020.   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   Enlarged prostate  Flu 09/2013   GERD (gastroesophageal reflux disease)    takes Nexium daily   Glaucoma    History of blood transfusion    "related to cancer"   History of colon polyps    HLD (hyperlipidemia)    HTN (hypertension)    ICD (implantable cardioverter-defibrillator) in place    IDA (iron deficiency anemia)    Insomnia    states he has always been like this.But doesn't take any meds   Kidney stones    "I passed  them all"   Large cell lymphoma ([place])    Nocturia    Peripheral edema    takes Furosemide daily   Peripheral neuropathy    Permanent atrial fibrillation (HCC)    Rheumatic heart disease    a. s/p mechanical (St. Jude) MVR 2002;  b. Echo 5/11: Mild LVH, EF 45-50%, mild AS, mild AI, mean gradient 11 mmHg, MVR with normal gradients, moderate LAE, mild RAE;  c.  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   Transaminitis    Type II diabetes mellitus ([place])     Past Surgical History:  Procedure Laterality Date   APPLICATION OF WOUND VAC  2014   CARDIAC CATHETERIZATION  2002   COLONOSCOPY     CORONARY ARTERY BYPASS GRAFT  2002   x 3   CORONARY ARTERY BYPASS GRAFT     CORONARY ATHERECTOMY N/A 06/24/2020   Procedure: CORONARY ATHERECTOMY;  Surgeon: [name], MD;  Location: [place] [facility];  Service: Cardiovascular;  Laterality: N/A;   CORONARY BALLOON ANGIOPLASTY N/A 06/24/2020   Procedure: CORONARY BALLOON ANGIOPLASTY;  Surgeon: [name], MD;  Location: [place] [facility];  Service: Cardiovascular;  Laterality: N/A;   CORONARY STENT INTERVENTION N/A 06/24/2020   Procedure: CORONARY STENT INTERVENTION;  Surgeon: [name], MD;  Location: [place] [facility];  Service: Cardiovascular;  Laterality: N/A;   ESOPHAGOGASTRODUODENOSCOPY     HERNIA REPAIR     ICD IMPLANT N/A 02/19/2020   Procedure: ICD IMPLANT;  Surgeon: Evans Lance, MD;  Location: [place] [facility];  Service: Cardiovascular;  Laterality: N/A;   INCISIONAL HERNIA REPAIR N/A 03/15/2014   Procedure: LAPAROSCOPIC INCISIONAL HERNIA;  Surgeon: Harl Bowie, MD;  Location: [place];  Service: General;  Laterality: N/A;   INSERTION OF MESH N/A 03/15/2014   Procedure: INSERTION OF MESH;  Surgeon: Harl Bowie, MD;  Location: [place];  Service: General;  Laterality: N/A;    LAPAROSCOPIC INCISIONAL / UMBILICAL / [place]  03/15/2014   IHR   LAPAROTOMY N/A 02/09/2013   Procedure: EXPLORATORY LAPAROTOMY WITH incisional biopsy intra- ABDOMINAL MASS ILOCECTOMY ;  Surgeon: Harl Bowie, MD;  Location: [facility];  Service: General;  Laterality: N/A;   MITRAL VALVE REPLACEMENT  11/2000   #33 St. Jude mechanical mitral valve prosthesis. Dr. Servando Snare   [facility] REMOVAL  03/15/2014   PORT-A-CATH REMOVAL Left 03/15/2014   Procedure: REMOVAL PORT-A-CATH;  Surgeon: Harl Bowie, MD;  Location: [place];  Service: General;  Laterality: Left;   PORTACATH PLACEMENT N/A 02/09/2013   Procedure: INSERTION PORT-A-CATH;  Surgeon: Harl Bowie, MD;  Location: [facility];  Service: General;  Laterality: N/A;   RIGHT AND LEFT HEART CATH N/A 11/27/2021   Procedure: RIGHT AND LEFT HEART CATH;  Surgeon: Larey Dresser, MD;  Location: [place] [facility];  Service: Cardiovascular;  Laterality: N/A;   RIGHT/LEFT HEART CATH AND CORONARY/GRAFT ANGIOGRAPHY N/A 05/10/2017   Procedure: RIGHT/LEFT HEART CATH AND CORONARY/GRAFT ANGIOGRAPHY;  Surgeon: Martinique, Peter M, MD;  Location: [place] [facility];  Service: Cardiovascular;  Laterality: N/A;   RIGHT/LEFT HEART CATH AND CORONARY/GRAFT ANGIOGRAPHY N/A 06/23/2020   Procedure: RIGHT/LEFT HEART CATH AND CORONARY/GRAFT ANGIOGRAPHY;  Surgeon: Troy Sine, MD;  Location: [place] [facility];  Service: Cardiovascular;  Laterality: N/A;   SEPTOPLASTY     SMALL INTESTINE SURGERY     TEE WITHOUT CARDIOVERSION N/A 11/21/2021   Procedure: TRANSESOPHAGEAL ECHOCARDIOGRAM (TEE);  Surgeon: Larey Dresser, MD;  Location: [facility];  Service: Cardiovascular;  Laterality: N/A;     Home Medications:  Prior to Admission medications   Medication Sig Start Date End Date Taking? Authorizing Provider  clopidogrel (PLAVIX) 75 MG tablet TAKE 1 TABLET BY MOUTH DAILY WITH BREAKFAST. 04/17/21  Yes Furth, Cadence H, PA-C  glipiZIDE (GLUCOTROL XL) 10 MG 24 hr  tablet Take 1 tablet (10 mg total) by mouth daily. 06/09/21  Yes Stacks, Cletus Gash, MD  insulin glargine (LANTUS) 100 UNIT/ML injection Inject 0.7 mLs (70 Units total) into the skin at bedtime. Patient taking differently: Inject 70 Units into the skin. Take 70 units if over 150 daily 07/01/20  Yes Furth, Cadence H, PA-C  JANUVIA 50 MG tablet Take 0.5 tablets (25 mg total) by mouth daily. 12/02/21  Yes Kroeger, Daleen Snook M., PA-C  pantoprazole (PROTONIX) 40 MG tablet Take 1 tablet (40 mg total) by mouth daily. 06/09/21  Yes Stacks, Cletus Gash, MD  rosuvastatin (CRESTOR) 20 MG tablet 20 mg. Patient takes 1 tablet by mouth every other day.   Yes [provider]  sodium bicarbonate 650 MG tablet Take 650 mg by mouth 2 (two) times daily.   Yes [provider]  torsemide (DEMADEX) 20 MG tablet Take 40 mg by mouth 2 (two) times daily. If weight increases, increase to 60 mg bid   Yes [provider]  warfarin (COUMADIN) 5 MG tablet Take 1 tablet (5 mg total) by mouth daily. Patient taking differently: Take 5 mg by mouth daily. Alternates 2.5 x2 days then '5mg'$  alternating 04/03/21  Yes Claretta Fraise, MD  FUROSCIX 80 MG/10ML CTKT Inject into the skin. Patient not taking: Reported on 02/22/2022 01/24/22   [provider]  nitroGLYCERIN (NITROSTAT) 0.4 MG SL tablet Place 1 tablet (0.4 mg total) under the tongue every 5 (five) minutes as needed for chest pain. 01/15/22   Minus Breeding, MD  OneTouch Delica Lancets 25K MISC TEST 2 TIMES DAILY AS DIRECTED 02/24/21   Claretta Fraise, MD  [facility] VERIO test strip USE TO TEST TWICE A DAY 10/27/21   Claretta Fraise, MD    Inpatient Medications: Scheduled Meds:  furosemide  80 mg Intravenous Once   insulin aspart  0-5 Units Subcutaneous QHS   insulin aspart  0-9 Units Subcutaneous TID WC   metolazone  5 mg Oral Once   midodrine  10 mg Oral TID WC   pantoprazole  40 mg Oral Daily   rosuvastatin  40 mg Oral QODAY   sodium bicarbonate  650 mg Oral  BID   warfarin  2.5 mg Oral ONCE-1600   Warfarin - Pharmacist Dosing Inpatient   Does not apply q1600   Continuous Infusions:  PRN Meds: acetaminophen  Allergies:    Allergies  Allergen Reactions   Allopurinol Shortness Of Breath   Doxycycline Hives  Farxiga [Dapagliflozin]     Can not tolerate - dizzy weak almost passed out    Social History:   Social History   Socioeconomic History   Marital status: Married    Spouse name: Marcie Bal   Number of children: 1   Years of education: 12   Highest education level: High school graduate  Occupational History   Occupation: disabled  Tobacco Use   Smoking status: Former    Types: Cigars   Smokeless tobacco: Never  Vaping Use   Vaping Use: Never used  Substance and Sexual Activity   Alcohol use: No    Comment: "drank for 30 years; quit ~ 2000"   Drug use: No    Types: Marijuana    Comment: quit in 2000 'reefer'   Sexual activity: Yes  Other Topics Concern   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   Social Determinants of Health   Financial Resource Strain: Low Risk  (06/12/2019)   Overall Financial Resource Strain (CARDIA)    Difficulty of Paying Living Expenses: Not hard at all  Food Insecurity: No Food Insecurity (06/12/2019)   Hunger Vital Sign    Worried About Running Out of Food in the Last Year: Never true    Ran Out of Food in the Last Year: Never true  Transportation Needs: No Transportation Needs (06/12/2019)   PRAPARE - Hydrologist (Medical): No    Lack of Transportation (Non-Medical): No  Physical Activity: Sufficiently Active (06/12/2019)   Exercise Vital Sign    Days of Exercise per Week: 3 days    Minutes of Exercise per Session: 120 min  Stress: No Stress Concern Present (06/12/2019)   [place]    Feeling of Stress : Not at all  Social Connections: Moderately  Isolated (06/12/2019)   Social Connection and Isolation Panel [NHANES]    Frequency of Communication with Friends and Family: More than three times a week    Frequency of Social Gatherings with Friends and Family: More than three times a week    Attends Religious Services: Never    Marine scientist or Organizations: No    Attends Archivist Meetings: Never    Marital Status: Married  Human resources officer Violence: Not At Risk (06/12/2019)   Humiliation, Afraid, Rape, and Kick questionnaire    Fear of Current or Ex-Partner: No    Emotionally Abused: No    Physically Abused: No    Sexually Abused: No    Family History:    Family History  Problem Relation Age of Onset   Diabetes Mother    Hypertension Father    Thyroid disease Sister    Diabetes Brother    Arthritis Brother    Depression Brother    Heart disease Brother    Hyperlipidemia Brother    Hypertension Brother    Hypertension Brother    Diabetes Brother    Heart disease Brother    Hyperlipidemia Brother      ROS:  Difficult to fully obtain due to confusion  Physical Exam/Data:   Vitals:   02/25/22 0455 02/25/22 0458 02/25/22 1007 02/25/22 1013  BP: (!) 91/57  (!) 80/56 (!) 81/59  Pulse: (!) 102   88  Resp: 20  20   Temp: (!) 97.5 F (36.4 C)     TempSrc: Oral     SpO2: 97%  92% 100%  Weight:  85.7 kg    Height:        Intake/Output Summary (Last 24 hours) at 02/25/2022 1127 Last data filed at 02/25/2022 1002 Gross per 24 hour  Intake 495 ml  Output 470 ml  Net 25 ml      02/25/2022    4:58 AM 02/24/2022    4:01 AM 02/23/2022    2:00 AM  Last 3 Weights  Weight (lbs) 189 lb 187 lb 1.6 oz 186 lb 12.8 oz  Weight (kg) 85.73 kg 84.868 kg 84.732 kg     Body mass index is 26.36 kg/m.  Exam per MD  EKG:  The EKG was personally reviewed and demonstrates:  atrial fib 104bpm with RBBB and nonspecific STT changes  Telemetry:  Telemetry was personally reviewed and demonstrates:  atrial fib with  borderline elevated rates  Relevant CV Studies: 2D echo 11/17/21   1. Left ventricular ejection fraction, by estimation, is 20 to 25%. The  left ventricle has severely decreased function. The left ventricle  demonstrates global hypokinesis. The left ventricular internal cavity size  was moderately dilated. Left  ventricular diastolic parameters are indeterminate. No LV thrombus.   2. Right ventricular systolic function is severely reduced. The right  ventricular size is normal. There is severely elevated pulmonary artery  systolic pressure. The estimated right ventricular systolic pressure is  29.5 mmHg.   3. Left atrial size was severely dilated.   4. Right atrial size was mildly dilated.   5. Moderate pleural effusion in the left lateral region.   6. The mitral valve has been replaced by a mechanical bileafelt valve. No  evidence of mitral valve regurgitation, no PVL.   7. Tricuspid valve regurgitation is moderate to severe. There is systolic  flow reversal of the hepatic vein.   8. The aortic valve is calcified. Aortic valve regurgitation is moderate.  Moderate aortic valve stenosis. Aortic valve mean gradient measures 15.0  mmHg. There is a component of low flow low gradient aortic stenosis.   9. The inferior vena cava is dilated in size with >50% respiratory  variability, suggesting right atrial pressure of 8 mmHg.   TEE 11/21/21  IMPRESSIONS     1. Procedure Date: 11/20/2020.   Comparison(s):   Conclusion(s)/Recommendation(s):   FINDINGS   Left Ventricular Assist Device:   Left Ventricle:      LV Wall Scoring:   Right Ventricle:   Left Atrium:   Right Atrium:   Pericardium:   Mitral Valve: There is a 33 mm St. Jude mechanical valve present in the  mitral position. MV peak gradient, 12.1 mmHg.The mean mitral valve  gradient is 5.0 mmHg.   Tricuspid Valve:   Aortic Valve: Aortic regurgitation PHT measures 370 msec.Aortic valve mean  gradient measures  15.0 mmHg.Aortic valve peak gradient measures 29.1  mmHg.Aortic valve area, by VTI measures 1.36 cm.   Pulmonic Valve:   Aorta:   Pulmonary Artery:   Venous:   IAS/Shunts:   Cath 11/27/21 1. Right and left heart filling pressures remain elevated.  2. 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  3. Preserved cardiac output.  4. Mild aortic stenosis by cath.    I do not think this patient will be a good TAVR candidate.  With creatinine still elevated and filling pressures elevated, I will start back on low dose milrinone 0.125 and continue diuresis.   Laboratory Data:  High Sensitivity Troponin:   Recent Labs  Lab 02/22/22 1427 02/22/22 1633  TROPONINIHS 54* 61*  Chemistry Recent Labs  Lab 02/22/22 1427 02/22/22 1633 02/23/22 0453 02/25/22 0300  NA 135  --  137 135  K 3.3*  --  3.6 3.8  CL 93*  --  94* 95*  CO2 25  --  27 21*  GLUCOSE 231*  --  181* 149*  BUN 129*  --  133* 141*  CREATININE 4.04*  --  4.15* 4.20*  CALCIUM 9.7  --  8.7* 8.6*  MG  --  2.5* 2.6*  --   GFRNONAA 15*  --  15* 15*  ANIONGAP 17*  --  16* 19*    Recent Labs  Lab 02/22/22 1427 02/23/22 0453 02/25/22 0300  PROT 6.6 6.0*  --   ALBUMIN 3.4* 2.7* 2.8*  AST 27 25  --   ALT 13 16  --   ALKPHOS 323* 304*  --   BILITOT 2.1* 2.0*  --    Lipids No results for input(s): "CHOL", "TRIG", "HDL", "LABVLDL", "LDLCALC", "CHOLHDL" in the last 168 hours.  Hematology Recent Labs  Lab 02/22/22 1427 02/23/22 0453 02/25/22 0300  WBC 4.6 3.8* 4.2  RBC 4.65 4.39 4.61  HGB 12.6* 12.0* 12.8*  HCT 40.1 38.4* 39.8  MCV 86.2 87.5 86.3  MCH 27.1 27.3 27.8  MCHC 31.4 31.3 32.2  RDW 17.6* 17.4* 17.6*  PLT 170 148* 164   Thyroid No results for input(s): "TSH", "FREET4" in the last 168 hours.  BNP Recent Labs  Lab 02/22/22 1426  BNP 3,004.6*    DDimer No results for input(s): "DDIMER" in the last 168 hours.   Radiology/Studies:  [place] EKG SITE RITE  Result Date:  02/25/2022 If Site Rite image not attached, placement could not be confirmed due to current cardiac rhythm.  US RENAL  Result Date: 02/23/2022 CLINICAL DATA:  AKI. EXAM: RENAL / URINARY TRACT ULTRASOUND COMPLETE COMPARISON:  None Available. FINDINGS: Right Kidney: Renal length: 12 cm. Echogenicity within normal limits. No mass or hydronephrosis visualized. Left Kidney: Renal length: 10.2 cm. Echogenicity within normal limits. No mass or hydronephrosis visualized. Bladder: Appears normal for degree of bladder distention. Three dural jets not visualized Other: Incidental note of ascites. IMPRESSION: 1. No sonographic evidence of acute renal abnormality. 2. Incidental note of ascites. Electronically Signed   By: Margaretha Sheffield M.D.   On: 02/23/2022 12:59   CT Head Wo Contrast  Result Date: 02/22/2022 CLINICAL DATA:  Provided history: Mental status change, unknown cause. EXAM: CT HEAD WITHOUT CONTRAST TECHNIQUE: Contiguous axial images were obtained from the base of the skull through the vertex without intravenous contrast. RADIATION DOSE REDUCTION: This exam was performed according to the departmental dose-optimization program which includes automated exposure control, adjustment of the mA and/or kV according to patient size and/or use of iterative reconstruction technique. COMPARISON:  Prior head CT 09/29/2011. FINDINGS: Brain: Mild-to-moderate cerebral atrophy. Mild cerebellar atrophy. 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 Suspected small chronic cortical/subcortical infarct within the high right parietal lobe, new from the prior head CT of 09/29/2011 (series 2, images 22-26). There is no acute intracranial hemorrhage. No acute demarcated cortical infarct. No extra-axial fluid collection. No evidence of an intracranial mass. No midline shift. Vascular: No hyperdense vessel. Atherosclerotic calcifications. Skull: No  fracture or aggressive osseous lesion. Sinuses/Orbits: No mass or acute finding within the imaged orbits. No significant paranasal sinus disease at the imaged levels. IMPRESSION: No evidence of acute intracranial abnormality. Redemonstrated small to moderate-sized chronic cortical/subcortical infarct  within the mid right frontal lobe and right insula (MCA territory). Suspected small chronic cortical/subcortical infarct within the high right parietal lobe, new from the prior head CT of 09/29/2011 (MCA/ACA watershed territory). 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 Mild-to-moderate cerebral atrophy. Mild cerebellar atrophy. Electronically Signed   By: Kellie Simmering D.O.   On: 02/22/2022 14:56   DG Chest Port 1 View  Result Date: 02/22/2022 CLINICAL DATA:  weakness EXAM: PORTABLE CHEST 1 VIEW COMPARISON:  December 13, 2021 FINDINGS: Again seen are the sternotomy wires, CABG changes and mitral valve replacement changes. Stable right subclavian single lead pacemaker defibrillator. Cardiomegaly. Bilateral pleural effusion greater on the left and is new. Mild central pulmonary vascular congestion. No focal consolidation. The visualized skeletal structures are unremarkable. IMPRESSION: Bilateral small pleural effusion greater on the left and is new. Cardiomegaly and mild central pulmonary vascular congestion. Electronically Signed   By: Frazier Richards M.D.   On: 02/22/2022 14:47     Assessment and Plan:   1. Subacute encephalopathy with confusion for several weeks - etiology unclear, may be multifactorial in setting of low output state and uremia, will also add ammonia to labs  2. Acute on chronic systolic CHF with suspected cardiogenic shock - guideline directed therapy severely limited by ongoing hypotension, AKI - per d/w Dr. Oval Linsey, anticipate initiation of milrinone drip once PICC in place - will need to elicit HF input if he does not clinically respond to this strategy  3. AKI  superimposed on CKD stage 3b with uremia - nephrology on board  4. New small parietal infarct - management per primary team - INR subtherapeutic in 01/2024 but supratherapeutic this admission  5. Permanent atrial fibrillation with known RBBB - rates 90s-100s, beta blocker stopped due to hypotension PTA - no role for cardioversion with permanent state - on warfarin for MVR  6. St. Jude ICD in place - followed by EP as OP - if goals of care change, will need to address ICD shock plan  7. Rheumatic heart disease with mechanical MVR 2002 on chronic warfarin - TEE 11/2021 as above - pharmacy following Coumadin  8. CAD s/p CABG 2002, PCI 06/2020 - per d/w MD, OK to continue to hold Plavix now that he is >1 year out from PCI per MD, and hold off adding ASA given acute stroke and ongoing warfarin management - hsTroponin low/flat, not felt to reflect ACS   Risk Assessment/Risk Scores:        New York Heart Association (NYHA) Functional Class NYHA Class IV  CHA2DS2-VASc Score = 7   This indicates a 11.2% annual risk of stroke. The patient's score is based upon: CHF History: 1 HTN History: 1 Diabetes History: 1 Stroke History: 2 Vascular Disease History: 1 Age Score: 1 Gender Score: 0      For questions or updates, please contact [facility] Please consult [email or web address] for contact info under    Signed, Charlie Pitter, PA-C  02/25/2022 11:27 AM

## 2022-02-25 NOTE — Progress Notes (Signed)
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  Allergies  Allergen Reactions   Allopurinol Shortness Of Breath   Doxycycline Hives   Farxiga [Dapagliflozin]     Can not tolerate - dizzy weak almost passed out    Patient Measurements: Height: '5\' 11"'$  (180.3 cm) Weight: 85.7 kg (189 lb) IBW/kg (Calculated) : 75.3  Vital Signs: Temp: 97.5 F (36.4 C) (06/18 0455) Temp Source: Oral (06/18 0455) BP: 91/57 (06/18 0455) Pulse Rate: 102 (06/18 0455)  Labs: Recent Labs    02/22/22 1427 02/22/22 1633 02/23/22 0453 02/24/22 0236 02/25/22 0300  HGB 12.6*  --  12.0*  --  12.8*  HCT 40.1  --  38.4*  --  39.8  PLT 170  --  148*  --  164  LABPROT 47.5*  --  46.7* 39.3* 34.0*  INR 5.2*  --  5.1* 4.1* 3.4*  CREATININE 4.04*  --  4.15*  --  4.20*  TROPONINIHS 54* 61*  --   --   --      Estimated Creatinine Clearance: 17.9 mL/min (A) (by C-G formula based on SCr of 4.2 mg/dL (H)).   Medical History: Past Medical History:  Diagnosis Date   Abnormal nuclear stress test 05/10/2017   Allergy    Anemia    "lost blood w/the cancer"   Arthritis    back    Atrial fibrillation (HCC)    takes Coumadin daily   Bruises easily    takes Coumadin daily   CAD (coronary artery disease)    a. s/p CABG 11/2000 (L-LAD, S-RCA);  b. Lexiscan Myoview 11/12: EF 45%, ischemia and possibly some scar at the base of the inferolateral wall;   c. Lex MV 11/13:  EF 44%, Inf and IL scar/soft tissue atten, small mild amt of inf ischemia,   CHF (congestive heart failure) (HCC)    Constipation    takes Colace daily   Contrast dye induced nephropathy 07/01/2020   Enlarged prostate    Flu 09/2013   GERD (gastroesophageal reflux disease)    takes Nexium daily   Glaucoma    History of blood transfusion    "related to cancer"   History of colon polyps    HLD (hyperlipidemia)    takes Vytorin daily   HTN (hypertension)    takes  Amlodipine,Metoprolol, and Ramipril daily   Insomnia    states he has always been like this.But doesn't take any meds   Kidney stones    "I passed them all"   Nocturia    Peripheral edema    takes Furosemide daily   Peripheral neuropathy    Rheumatic fever ~ 1965   Rheumatic heart disease    a. s/p mechanical (St. Jude) MVR 2002;  b. Echo 5/11: Mild LVH, EF 45-50%, mild AS, mild AI, mean gradient 11 mmHg, MVR with normal gradients, moderate LAE, mild RAE;  c.  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 On warfarin PTA for mechanical  MVR [St. Jude] and AF. Patient had Rheumatic Fever in 1965 with resultant Rheumatic Heart Disease requiring a MVR in 2002. There was no bleed on CT of the head which was completed on 02/22/2022  His INR is therapeutic at 3.4. I expect INR will continue to decline with vitamin K administration on 6/17. Will give home dose today.  Home dose of coumadin was recently changed [01/2022] to 2.5 mg daily His last dose of coumadin was on 02/21/2022 per patient and significant other.  Goal of Therapy:  INR 2.5-3.5 Monitor platelets by anticoagulation protocol: Yes   Plan:  Give warfarin 2.5 mg today Daily PT/INR, resume warfarin as INR allows  Thank you for allowing pharmacy to participate in this patient's care.  Reatha Harps, PharmD PGY1 Pharmacy Resident 02/25/2022 7:33 AM Check [email or web address] for unit specific pharmacy number

## 2022-02-25 NOTE — Progress Notes (Signed)
John [name] Progress Note   71 y.o. male HTN, CASHD s/p CABG x3 2002, mechanical MVR 2002 on Coumadin, St Jude ICD 02/19/2020, aortic valve stenosis, rheumatic fever, PCI in 06/24/2020, atrial fibrillation, HLD, DM, HFrEF 20-25% presenting in confusion a cardiology clinic visit. According to his spouse this has been present and worsening for 3 weeks. In the ED the patient had a new small chronic infarct in the right parietal lobe. He was afebrile and found to be in renal failure with a cr of 4.04 and BNP 3004.  Patient had been on torsemide '60mg'$  BID 01/31/2022 at cardiology clinic visit.   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 Urinalysis shows minimal proteinuria and does not display an active sediment. Renal function appears to be progressively worse since 01/31/22 which frequent variation. Ultrasound 6/16 neg for obstructive uropathy. AKI likely from [place]. - He appears to be overloaded with a JVD, lower extremity edema and weight is ~84.9 kg @ admission. His weight was 81kg at the cardiology appt in late May 2023. Also his wife said his appetite has not been good overall. Weight now up 1.3kg since hospitalization with worsening of renal function c/w CRS.  - Would like diurese with Metolazone and Lasix; appears he may need an inotrope as he remains hypotensive. Will likely need milrinone; he does not want dialysis in the future if needed and a PICC line is fine in that case. I'm not convinced he would even tolerate iHD given his cardiac function.  - Agree with heart failure consult.  -Maintain MAP>65 for optimal renal perfusion.  - Avoid nephrotoxic medications including NSAIDs and iodinated intravenous contrast exposure unless the latter is absolutely indicated.   - Preferred narcotic agents for pain control are hydromorphone, fentanyl, and methadone. Morphine should not be used.  - Avoid  Baclofen and avoid oral sodium phosphate and magnesium citrate based laxatives / bowel preps.  - Continue strict Input and Output monitoring. Will monitor the patient closely with you and intervene or adjust therapy as indicated by changes in clinical status/labs     Encephalopathy - could this be from slowly worsening renal function? CT shows only a small chronic infarct which is new. HFrEF - 25-05% certainly has edema in the lower extremity. DM New infarct in high right parietal lobe which is new. Atrial fibrillation with a supratherapeutic INR Diffuse large cell lymphoma (diagnosed in 2014, status post R-CHOP in 2014, now in remission)  Subjective:   No change; denies dyspnea, fever, chills, nausea. But appears more short of breath. Still confused.   Objective:   BP (!) 91/57 (BP Location: Left Arm)   Pulse (!) 102   Temp (!) 97.5 F (36.4 C) (Oral)   Resp 20   Ht '5\' 11"'$  (1.803 m)   Wt 85.7 kg   SpO2 97%   BMI 26.36 kg/m   Intake/Output Summary (Last 24 hours) at 02/25/2022 0909 Last data filed at 02/25/2022 0817 Gross per 24 hour  Intake 615 ml  Output 570 ml  Net 45 ml   Weight change: 0.862 kg  Physical Exam: General: NAD, not tachypneic, pleasant HEENT: [place]/AT, moist mucosal membranes Respiratory: rales present b/l CV: S1S2, irreg irreg, no murmur/rubs/gallops, JVD present to 8cm above angle of [place] Abdomen: soft, nontender, nondistended, normal bowel sounds, no palpable mass Extremities: 1+ pitting edema b/l LE's Skin: no rash, good turgor Neuro: no focal deficits, moving all ext  spontaneously GU: no foley  Back: no flank tenderness to palpation  Imaging: [place] EKG SITE RITE  Result Date: 02/25/2022 If Site Rite image not attached, placement could not be confirmed due to current cardiac rhythm.  US RENAL  Result Date: 02/23/2022 CLINICAL DATA:  AKI. EXAM: RENAL / URINARY TRACT ULTRASOUND COMPLETE COMPARISON:  None Available. FINDINGS: Right Kidney: Renal  length: 12 cm. Echogenicity within normal limits. No mass or hydronephrosis visualized. Left Kidney: Renal length: 10.2 cm. Echogenicity within normal limits. No mass or hydronephrosis visualized. Bladder: Appears normal for degree of bladder distention. Three dural jets not visualized Other: Incidental note of ascites. IMPRESSION: 1. No sonographic evidence of acute renal abnormality. 2. Incidental note of ascites. Electronically Signed   By: Margaretha Sheffield M.D.   On: 02/23/2022 12:59    Labs: BMET Recent Labs  Lab 02/19/22 0848 02/22/22 1427 02/23/22 0453 02/25/22 0300  NA 135 135 137 135  K 3.7 3.3* 3.6 3.8  CL 94* 93* 94* 95*  CO2 '24 25 27 '$ 21*  GLUCOSE 208* 231* 181* 149*  BUN 134* 129* 133* 141*  CREATININE 3.37* 4.04* 4.15* 4.20*  CALCIUM 9.1 9.7 8.7* 8.6*  PHOS  --   --  4.5 5.1*   CBC Recent Labs  Lab 02/22/22 1427 02/23/22 0453 02/25/22 0300  WBC 4.6 3.8* 4.2  NEUTROABS  --  2.7  --   HGB 12.6* 12.0* 12.8*  HCT 40.1 38.4* 39.8  MCV 86.2 87.5 86.3  PLT 170 148* 164    Medications:     furosemide  80 mg Intravenous Once   insulin aspart  0-5 Units Subcutaneous QHS   insulin aspart  0-9 Units Subcutaneous TID WC   metolazone  5 mg Oral Once   midodrine  10 mg Oral TID WC   pantoprazole  40 mg Oral Daily   rosuvastatin  40 mg Oral QODAY   sodium bicarbonate  650 mg Oral BID   warfarin  2.5 mg Oral ONCE-1600   Warfarin - Pharmacist Dosing Inpatient   Does not apply q1600      Otelia Santee, MD 02/25/2022, 9:09 AM

## 2022-02-25 NOTE — Progress Notes (Signed)
Peripherally Inserted Central Catheter Placement  The IV Nurse has discussed with the patient and/or persons authorized to consent for the patient, the purpose of this procedure and the potential benefits and risks involved with this procedure.  The benefits include less needle sticks, lab draws from the catheter, and the patient may be discharged home with the catheter. Risks include, but not limited to, infection, bleeding, blood clot (thrombus formation), and puncture of an artery; nerve damage and irregular heartbeat and possibility to perform a PICC exchange if needed/ordered by physician.  Alternatives to this procedure were also discussed.  Bard Power PICC patient education guide, fact sheet on infection prevention and patient information card has been provided to patient /or left at bedside.    PICC Placement Documentation  PICC Double Lumen 02/25/22 Left Brachial 43 cm 0.5 cm (Active)  Indication for Insertion or Continuance of Line Vasoactive infusions 02/25/22 1138  Exposed Catheter (cm) 0.5 cm 02/25/22 1138  Site Assessment Clean, Dry, Intact 02/25/22 1138  Lumen #1 Status Saline locked;Blood return noted 02/25/22 1138  Lumen #2 Status Saline locked;Blood return noted 02/25/22 1138  Dressing Type Transparent;Securing device 02/25/22 1138  Dressing Status Antimicrobial disc in place;Clean, Dry, Intact 02/25/22 1138  Safety Lock Not Applicable 76/54/65 0354  Line Care Connections checked and tightened 02/25/22 1138  Line Adjustment (NICU/IV Team Only) No 02/25/22 1138  Dressing Intervention New dressing 02/25/22 1138  Dressing Change Due Mar 23, 2022 02/25/22 [place], [place] 02/25/2022, 11:39 AM

## 2022-02-25 NOTE — Progress Notes (Addendum)
Progress Note   Patient: John [name] IOE:[id number] DOB: [birth year] DOA: 02/22/2022     3 DOS: the patient was seen and examined on 02/25/2022   Brief hospital course: Mr. Goldie was admitted to the hospital with the working diagnosis of worsening renal function.   71 yo male with the past medical history of heart failure with reduced systolic function, CAD, mechanical mitral valve replacement, aortic valve stenosis, rheumatic fever, coronary artery disease, CKD stage 3b, atrial fibrillation, and T2DM who presented with altered mental status. Patient was noted to be confused for 3 weeks. Because worsening symptoms his wife brought him to the hospital. Per his wife his blood pressure has been 80 to 90 systolic over last 2 weeks.    On his initial physical examination his blood pressure was 97/63, HR 87, RR 18 and 02 saturation 97% on 2 L per [place], lungs with no wheezing, heart with S1 and S2 present and rhythmic, abdomen not distended, lower extremities with wraps in place.   Na 135, K 3,3 Cl 93, bicarbonate 25, glucose 231, bun 129 cr 4,0 BNP 3,004 High sensitive troponin 54  Wbc 4,6 hgb 12,6 plt 170  INR 5,2   Head CT with no acute changes, small to moderate size chronic cortical and subcortical infarcts within the mid right frontal lobe and right insula.  Small chronic cortical and subcortical infarct within the high right parietal lobe. Chronic lacunar infarcts within the left thalamus.   Chest radiograph with cardiomegaly, bilateral hilar vascular congestion, bilateral small pleural effusions.  Pacer defibrillator with one ventricular lead  EKG 104 bpm, right axis deviation, right bundle branch block, qtc prolongation, atrial fibrillation rhythm with no significant ST segment  or T wave changes.   Patient with hypotension.    Assessment and Plan: * Acute on chronic systolic CHF (congestive heart failure) ([place]) 11/2021 echocardiogram with reduced LV systolic function down to 20  to 25%. Severe reduction in RV systolic function. Low gradient aortic stenosis.   Rheumatic heart disease, sp mitral valve replacement.   Plan to continue midodrine. Follow up with cardiology recommendations, patient will likely need inotropic support.  He may need a PICC line, will check if [facility] per nephrology.  Holding diuretic therapy due to hypotension.     Acute kidney injury superimposed on chronic kidney disease ([place]) CKD stage 3b Continue worsening renal function, likely cardiorenal syndrome. Renal function today with serum cr at 4.20 with BUN 141, K is 3,8 and serum bicarbonate is 21.   Plan to consult cardiology for inotropic support.  Continue close follow up renal function and electrolytes.  Considering rheumatic heart disease with aortic stenosis, renal replacement therapy will be likely challenging.  Patient does not want hemodialysis, will order a PICC line for vaso-active medications. Discussed with Dr Augustin Coupe from nephrology.    Chronic atrial fibrillation (HCC) Continue on  atrial fibrillation, HR in the 90 to 100  range.  Holding AV blockade for for now.  INR is therapeutic at 3,4 today.     Gastroesophageal reflux disease - Continue PPI.  HTN (hypertension) Hypotension. Persistent hypotension despite midodrine.   Rheumatic heart disease Patient sp mechanical mitral valve replacement, on warfarin therapy. INR is therapeutic Aortic stenosis with low gradient.  Not TAVR candidate (oldr records personally reviewed).   Type 2 diabetes mellitus with hyperlipidemia ([place]) Hold on basal insulin due to low GFR. Continue with insulin sliding scale for glucose cover and monitoring.  Fasting glucose this am is 149 mg/dl.  Continue with statin therapy.         Subjective: Patient with dyspnea overnight, continue hypotensive, no chest pain or palpitations.   Physical Exam: Vitals:   02/24/22 1500 02/24/22 1941 02/25/22 0455 02/25/22 0458  BP: 98/69 (!) 95/59  (!) 91/57   Pulse:  95 (!) 102   Resp:  20 20   Temp:  (!) 97.4 F (36.3 C) (!) 97.5 F (36.4 C)   TempSrc:  Oral Oral   SpO2:  96% 97%   Weight:    85.7 kg  Height:       Neurology awake and alert ENT with no pallor Cardiovascular with S1 and S2 present irregularly irregular, no gallops. Positive JVD Positive lower extremity edema, warm temperature Respiratory with no wheezing Abdomen not distended  Data Reviewed:    Family Communication: no family at the bedside   Disposition: Status is: Inpatient Remains inpatient appropriate because: heart failure   Planned Discharge Destination: Home    Author: Tawni Millers, MD 02/25/2022 8:54 AM  For on call review [email or web address].

## 2022-02-26 ENCOUNTER — Inpatient Hospital Stay (HOSPITAL_COMMUNITY): Payer: PPO

## 2022-02-26 DIAGNOSIS — I5023 Acute on chronic systolic (congestive) heart failure: Secondary | ICD-10-CM

## 2022-02-26 DIAGNOSIS — Z66 Do not resuscitate: Secondary | ICD-10-CM | POA: Diagnosis not present

## 2022-02-26 DIAGNOSIS — I482 Chronic atrial fibrillation, unspecified: Secondary | ICD-10-CM | POA: Diagnosis not present

## 2022-02-26 DIAGNOSIS — K219 Gastro-esophageal reflux disease without esophagitis: Secondary | ICD-10-CM | POA: Diagnosis not present

## 2022-02-26 DIAGNOSIS — N189 Chronic kidney disease, unspecified: Secondary | ICD-10-CM

## 2022-02-26 DIAGNOSIS — R0609 Other forms of dyspnea: Secondary | ICD-10-CM

## 2022-02-26 DIAGNOSIS — N179 Acute kidney failure, unspecified: Secondary | ICD-10-CM | POA: Diagnosis not present

## 2022-02-26 DIAGNOSIS — I10 Essential (primary) hypertension: Secondary | ICD-10-CM

## 2022-02-26 DIAGNOSIS — Z515 Encounter for palliative care: Secondary | ICD-10-CM | POA: Diagnosis not present

## 2022-02-26 DIAGNOSIS — G9341 Metabolic encephalopathy: Secondary | ICD-10-CM | POA: Diagnosis not present

## 2022-02-26 LAB — CBC
HCT: 36.3 % — ABNORMAL LOW (ref 39.0–52.0)
Hemoglobin: 11.9 g/dL — ABNORMAL LOW (ref 13.0–17.0)
MCH: 27.7 pg (ref 26.0–34.0)
MCHC: 32.8 g/dL (ref 30.0–36.0)
MCV: 84.6 fL (ref 80.0–100.0)
Platelets: 158 10*3/uL (ref 150–400)
RBC: 4.29 MIL/uL (ref 4.22–5.81)
RDW: 17.6 % — ABNORMAL HIGH (ref 11.5–15.5)
WBC: 4.4 10*3/uL (ref 4.0–10.5)
nRBC: 0 % (ref 0.0–0.2)

## 2022-02-26 LAB — GLUCOSE, CAPILLARY
Glucose-Capillary: 167 mg/dL — ABNORMAL HIGH (ref 70–99)
Glucose-Capillary: 221 mg/dL — ABNORMAL HIGH (ref 70–99)
Glucose-Capillary: 258 mg/dL — ABNORMAL HIGH (ref 70–99)
Glucose-Capillary: 224 mg/dL — ABNORMAL HIGH (ref 70–99)

## 2022-02-26 LAB — RENAL FUNCTION PANEL
Albumin: 2.6 g/dL — ABNORMAL LOW (ref 3.5–5.0)
Anion gap: 17 — ABNORMAL HIGH (ref 5–15)
BUN: 136 mg/dL — ABNORMAL HIGH (ref 8–23)
CO2: 24 mmol/L (ref 22–32)
Calcium: 8.5 mg/dL — ABNORMAL LOW (ref 8.9–10.3)
Chloride: 94 mmol/L — ABNORMAL LOW (ref 98–111)
Creatinine, Ser: 4.32 mg/dL — ABNORMAL HIGH (ref 0.61–1.24)
GFR, Estimated: 14 mL/min — ABNORMAL LOW (ref >60)
Glucose, Bld: 173 mg/dL — ABNORMAL HIGH (ref 70–99)
Phosphorus: 4.6 mg/dL (ref 2.5–4.6)
Potassium: 3.7 mmol/L (ref 3.5–5.1)
Sodium: 135 mmol/L (ref 135–145)

## 2022-02-26 LAB — ECHOCARDIOGRAM LIMITED
AR max vel: 1.2 cm2
AV Area VTI: 1.27 cm2
AV Area mean vel: 1.15 cm2
AV Mean grad: 9.8 mmHg
AV Peak grad: 17.9 mmHg
Ao pk vel: 2.11 m/s
Area-P 1/2: 7.09 cm2
Calc EF: 19.9 %
Height: 71 in
MV VTI: 1.31 cm2
P 1/2 time: 207 msec
S' Lateral: 6.3 cm
Single Plane A2C EF: 19.7 %
Single Plane A4C EF: 18.3 %
Weight: 3024 oz

## 2022-02-26 LAB — COOXEMETRY PANEL
Carboxyhemoglobin: 1.3 % (ref 0.5–1.5)
Methemoglobin: <0.7 % (ref 0.0–1.5)
O2 Saturation: 65.6 %
Total hemoglobin: 11.8 g/dL — ABNORMAL LOW (ref 12.0–16.0)

## 2022-02-26 LAB — PROTIME-INR
INR: 2.7 — ABNORMAL HIGH (ref 0.8–1.2)
Prothrombin Time: 28.5 seconds — ABNORMAL HIGH (ref 11.4–15.2)

## 2022-02-26 MED ORDER — FUROSEMIDE 10 MG/ML IJ SOLN
80.0000 mg | Freq: Two times a day (BID) | INTRAMUSCULAR | Status: DC
  Administered 2022-02-26 (×2): 80 mg via INTRAVENOUS
  Filled 2022-02-26 (×3): qty 8

## 2022-02-26 MED ORDER — METOLAZONE 5 MG PO TABS
5.0000 mg | ORAL_TABLET | Freq: Every day | ORAL | Status: DC
  Administered 2022-02-26 – 2022-02-27 (×2): 5 mg via ORAL
  Filled 2022-02-26 (×3): qty 1

## 2022-02-26 MED ORDER — MIDODRINE HCL 5 MG PO TABS
15.0000 mg | ORAL_TABLET | Freq: Three times a day (TID) | ORAL | Status: DC
  Administered 2022-02-26 – 2022-03-01 (×9): 15 mg via ORAL
  Filled 2022-02-26 (×9): qty 3

## 2022-02-26 MED ORDER — PERFLUTREN LIPID MICROSPHERE
1.0000 mL | INTRAVENOUS | Status: AC | PRN
  Administered 2022-02-26: 2 mL via INTRAVENOUS

## 2022-02-26 MED ORDER — AMIODARONE HCL IN DEXTROSE 360-4.14 MG/200ML-% IV SOLN
60.0000 mg/h | INTRAVENOUS | Status: AC
  Administered 2022-02-26: 60 mg/h via INTRAVENOUS
  Filled 2022-02-26: qty 200

## 2022-02-26 MED ORDER — AMIODARONE HCL IN DEXTROSE 360-4.14 MG/200ML-% IV SOLN
30.0000 mg/h | INTRAVENOUS | Status: DC
  Administered 2022-02-26 – 2022-02-28 (×6): 30 mg/h via INTRAVENOUS
  Filled 2022-02-26 (×6): qty 200

## 2022-02-26 MED ORDER — WARFARIN SODIUM 5 MG PO TABS
5.0000 mg | ORAL_TABLET | Freq: Once | ORAL | Status: AC
  Administered 2022-02-26: 5 mg via ORAL
  Filled 2022-02-26: qty 1

## 2022-02-26 NOTE — Progress Notes (Addendum)
Progress Note  Patient Name: John [name] Date of Encounter: 02/26/2022  [facility] Cardiologist: Minus Breeding, MD   Subjective   Can sort of feel heart beating fast, had some presyncope yesterday. Ate breakfast, but would like cereal tomorrow. Would like wound care to check his legs  Inpatient Medications    Scheduled Meds:  Chlorhexidine Gluconate Cloth  6 each Topical Daily   insulin aspart  0-5 Units Subcutaneous QHS   insulin aspart  0-9 Units Subcutaneous TID WC   midodrine  10 mg Oral TID WC   pantoprazole  40 mg Oral Daily   rosuvastatin  40 mg Oral QODAY   sodium bicarbonate  650 mg Oral BID   sodium chloride flush  10-40 mL Intracatheter Q12H   warfarin  5 mg Oral ONCE-1600   Warfarin - Pharmacist Dosing Inpatient   Does not apply q1600   Continuous Infusions:  milrinone 0.25 mcg/kg/min (02/26/22 0019)   PRN Meds: acetaminophen, sodium chloride flush   Vital Signs    Vitals:   02/25/22 2143 02/26/22 0019 02/26/22 0443 02/26/22 0800  BP: (!) 97/53 98/68 (!) 98/56 94/65  Pulse: 94  (!) 134 (!) 127  Resp: '18 16 18 18  '$ Temp: (!) 97.4 F (36.3 C)  97.8 F (36.6 C) (!) 97.4 F (36.3 C)  TempSrc: Oral  Oral Oral  SpO2: 94%  92% 99%  Weight:      Height:        Intake/Output Summary (Last 24 hours) at 02/26/2022 0834 Last data filed at 02/26/2022 0810 Gross per 24 hour  Intake 633.45 ml  Output 150 ml  Net 483.45 ml      02/25/2022    4:58 AM 02/24/2022    4:01 AM 02/23/2022    2:00 AM  Last 3 Weights  Weight (lbs) 189 lb 187 lb 1.6 oz 186 lb 12.8 oz  Weight (kg) 85.73 kg 84.868 kg 84.732 kg      Telemetry    Atrial fib, RVR, up to 140s while eating - Personally Reviewed  ECG    None today - Personally Reviewed  Physical Exam   GEN: frail, elderly male,No acute distress.   Neck:  JVD to jaw Cardiac: Irreg R&R, +valve click, + murmurs, rubs, or gallops.  Respiratory: few rales bases bilaterally. GI: Soft, nontender,  non-distended  MS: No edema; No deformity. Neuro:  Nonfocal  Psych: Normal affect   Labs    High Sensitivity Troponin:   Recent Labs  Lab 02/22/22 1427 02/22/22 1633  TROPONINIHS 54* 61*     Chemistry Recent Labs  Lab 02/22/22 1427 02/22/22 1633 02/23/22 0453 02/25/22 0300 02/26/22 0452  NA 135  --  137 135 135  K 3.3*  --  3.6 3.8 3.7  CL 93*  --  94* 95* 94*  CO2 25  --  27 21* 24  GLUCOSE 231*  --  181* 149* 173*  BUN 129*  --  133* 141* 136*  CREATININE 4.04*  --  4.15* 4.20* 4.32*  CALCIUM 9.7  --  8.7* 8.6* 8.5*  MG  --  2.5* 2.6*  --   --   PROT 6.6  --  6.0*  --   --   ALBUMIN 3.4*  --  2.7* 2.8* 2.6*  AST 27  --  25  --   --   ALT 13  --  16  --   --   ALKPHOS 323*  --  304*  --   --  BILITOT 2.1*  --  2.0*  --   --   GFRNONAA 15*  --  15* 15* 14*  ANIONGAP 17*  --  16* 19* 17*    Lipids No results for input(s): "CHOL", "TRIG", "HDL", "LABVLDL", "LDLCALC", "CHOLHDL" in the last 168 hours.  Hematology Recent Labs  Lab 02/23/22 0453 02/25/22 0300 02/26/22 0452  WBC 3.8* 4.2 4.4  RBC 4.39 4.61 4.29  HGB 12.0* 12.8* 11.9*  HCT 38.4* 39.8 36.3*  MCV 87.5 86.3 84.6  MCH 27.3 27.8 27.7  MCHC 31.3 32.2 32.8  RDW 17.4* 17.6* 17.6*  PLT 148* 164 158   Thyroid No results for input(s): "TSH", "FREET4" in the last 168 hours.  BNP Recent Labs  Lab 02/22/22 1426  BNP 3,004.6*    DDimer No results for input(s): "DDIMER" in the last 168 hours.   Lab Results  Component Value Date   INR 2.7 (H) 02/26/2022   INR 3.4 (H) 02/25/2022   INR 4.1 (HH) 02/24/2022     Radiology    DG CHEST PORT 1 VIEW  Result Date: 02/25/2022 CLINICAL DATA:  PICC line placement. EXAM: PORTABLE CHEST 1 VIEW COMPARISON:  One-view chest x-ray 02/22/2022 FINDINGS: The heart is enlarged. Atherosclerotic calcifications are present at the aortic arch. Interval left-sided PICC line placement noted. The tip is at the cavoatrial junction. Single AICD lead is stable. Mild edema and  bilateral pleural effusions have increased. Bibasilar airspace opacity likely reflects atelectasis. IMPRESSION: No active disease. Electronically Signed   By: San Morelle M.D.   On: 02/25/2022 12:33   [place] EKG SITE RITE  Result Date: 02/25/2022 If Site Rite image not attached, placement could not be confirmed due to current cardiac rhythm.   Cardiac Studies   2D echo 11/17/21   1. Left ventricular ejection fraction, by estimation, is 20 to 25%. The  left ventricle has severely decreased function. The left ventricle  demonstrates global hypokinesis. The left ventricular internal cavity size  was moderately dilated. Left  ventricular diastolic parameters are indeterminate. No LV thrombus.   2. Right ventricular systolic function is severely reduced. The right  ventricular size is normal. There is severely elevated pulmonary artery  systolic pressure. The estimated right ventricular systolic pressure is  00.7 mmHg.   3. Left atrial size was severely dilated.   4. Right atrial size was mildly dilated.   5. Moderate pleural effusion in the left lateral region.   6. The mitral valve has been replaced by a mechanical bileafelt valve. No  evidence of mitral valve regurgitation, no PVL.   7. Tricuspid valve regurgitation is moderate to severe. There is systolic  flow reversal of the hepatic vein.   8. The aortic valve is calcified. Aortic valve regurgitation is moderate.  Moderate aortic valve stenosis. Aortic valve mean gradient measures 15.0  mmHg. There is a component of low flow low gradient aortic stenosis.   9. The inferior vena cava is dilated in size with >50% respiratory  variability, suggesting right atrial pressure of 8 mmHg.   Patient Profile     71 y.o. 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  Assessment & Plan    Acute on chronic CHF -  not on diuretic this admit, diuresis limited by hypotension and decreased renal function, likely has cardiorenal syndrome - on milrinone, but HR, BUN/Cr still trending up -Prior to admission was on Furoscix 80 mg, but he had stopped it. - Was also on sodium bicarb 650 mg twice daily -He is on midodrine 10 mg 3 times daily, started 6/18.  SBP still in the 90s - he was encephalopathic earlier this admission, but mental status is at baseline now -Per Dr. Blenda Mounts note 6/18, if he does not turn around with milrinone will need to discuss palliative care  2.  Low-flow low gradient aortic stenosis - Not a TAVR candidate  3.  CKD V -GFR today is 14, that is below his baseline, he is usually in the 20s - Creatinine up to 4.32, he is usually in the high twos -Milrinone was started 6/18, creatinine continues to trend up -He has been clear in his wishes that he does not want dialysis  4.  Atrial fib RVR - Beta-blocker held due to hypotension, has [place] ICD in place - Options are extremely limited  Otherwise, per IM  For questions or updates, please contact [place] Please consult [email or web address] for contact info under      Signed, Millard Bautch, PA-C  02/26/2022, 8:34 AM    History and all data above reviewed.  Patient examined.  I agree with the findings as above. He is very weak, frail.  SOB.  The patient exam reveals [id number]   ,  Lungs: Decreased breath sounds with bilateral basilar crackles  ,  Abd: Positive bowel sounds, no rebound no guarding, Ext Mild edema  .  All available labs, radiology testing, previous records reviewed. Agree with documented assessment and plan.   Acute on chronic systolic HF:  Likely  end stage and I will have conversations with his wife this week.  I will try some IV amiodarone to see if rate control helps at all.  However, limited next steps.  Continue IV milrinone for now.  CoOx is slightly better.     Jeneen Rinks [facility]  10:52 AM  02/26/2022

## 2022-02-26 NOTE — Progress Notes (Signed)
PT Cancellation Note  Patient Details Name: John [name] MRN: [id number] DOB: 1951-07-08   Cancelled Treatment:    Reason Eval/Treat Not Completed: Other (comment).  Pt had just received OT this AM then declined to have therapy in PM.  Wants to wait until "later".  Follow up as time and pt allow.   Ramond Dial 02/26/2022, 3:22 PM  Mee Hives, PT PhD Acute Rehab Dept. Number: [name] and [place]

## 2022-02-26 NOTE — Progress Notes (Signed)
Progress Note   Patient: John [name] GYJ:[id number] DOB: 1951-04-05 DOA: 02/22/2022     4 DOS: the patient was seen and examined on 02/26/2022   Brief hospital course: John [name] was admitted to the hospital with the working diagnosis of worsening renal function in the setting of heart failure decompensation.   71 yo male with the past medical history of heart failure with reduced systolic function, CAD, mechanical mitral valve replacement, aortic valve stenosis, rheumatic heart disease, coronary artery disease, CKD stage 3b, atrial fibrillation, and T2DM who presented with altered mental status. Patient was noted to be confused for 3 weeks. Because worsening symptoms his wife brought him to the hospital. Per his wife his blood pressure has been 80 to 90 systolic over last 2 weeks.    On his initial physical examination his blood pressure was 97/63, HR 87, RR 18 and 02 saturation 97% on 2 L per [place], lungs with no wheezing, heart with S1 and S2 present and rhythmic, abdomen not distended, lower extremities with wraps in place.   Na 135, K 3,3 Cl 93, bicarbonate 25, glucose 231, bun 129 cr 4,0 BNP 3,004 High sensitive troponin 54  Wbc 4,6 hgb 12,6 plt 170  INR 5,2   Head CT with no acute changes, small to moderate size chronic cortical and subcortical infarcts within the mid right frontal lobe and right insula.  Small chronic cortical and subcortical infarct within the high right parietal lobe. Chronic lacunar infarcts within the left thalamus.   Chest radiograph with cardiomegaly, bilateral hilar vascular congestion, bilateral small pleural effusions.  Pacer defibrillator with one ventricular lead  EKG 104 bpm, right axis deviation, right bundle branch block, qtc prolongation, atrial fibrillation rhythm with no significant ST segment  or T wave changes.   Patient with persistent hypotension and not able to diurese.  Paced PICC line and started on milrinone for inotropic  support. Patient with poor prognosis, patient not wanting renal replacement therapy and likely not candidate for HD.    Assessment and Plan: * Acute on chronic systolic CHF (congestive heart failure) ([place]) 11/2021 echocardiogram with reduced LV systolic function down to 20 to 25%. Severe reduction in RV systolic function. Low gradient aortic stenosis.   Rheumatic heart disease, sp mitral valve replacement.   Cardiogenic shock/ low output heart failure decompensation.   Urine output over last 24 hrs is 150 ml  Blood pressure systolic 98 mmHg.   Plan to continue medical therapy with milrinone and midodrine. Start patient on furosemide 80 mg IV q12 hrs Close follow up on blood pressure.  Follow up on limited echocardiogram.  This am his HR is up to 120 to 130 bpm, atrial flutter, will likely need amiodarone for rate control.  Will follow up with cardiology recommendations.  Avoid B blockade due to low output failure and RAS inhibition due to worsening renal function.     Acute kidney injury superimposed on chronic kidney disease ([place]) CKD stage 3b  Patient continue with volume overload, renal function continue worsening despite inotropic support.  Renal function today with a serum cr of 4.3, with K at 3,7 and serum bicarbonate at 24 with Na at 134.  Plan to start patient on furosemide for diuresis and follow up renal function in am.  Considering valvular heart disease patient likely will not tolerate regular renal replacement therapy. He has decided to avoid HD.   Chronic atrial fibrillation (HCC) Patient this am with atrial flutter with rapid ventricular response. Not candidate  for AV blockade due to low output heart failure.  Patient will need amiodarone for rate and possible rhythm control. Continue anticoagulation with warfarin. INR today is 2.7.  Continue telemetry monitoring.    Gastroesophageal reflux disease - Continue PPI.  HTN (hypertension) Hypotension. Low  output heart failure, continue with midodrine and milrinone.   Rheumatic heart disease Patient sp mechanical mitral valve replacement, on warfarin therapy. INR is therapeutic Aortic stenosis with low gradient.  Not TAVR candidate (oldr records personally reviewed).   Type 2 diabetes mellitus with hyperlipidemia ([place]) Hold on basal insulin due to low GFR. Continue with insulin sliding scale for glucose cover and monitoring.  Fasting glucose this am is '173mg'$ /dl.  Continue with statin therapy.         Subjective: Patient continue to have dyspnea, worse at night , no chest pain or palpitations   Physical Exam: Vitals:   02/25/22 2143 02/26/22 0019 02/26/22 0443 02/26/22 0800  BP: (!) 97/53 98/68 (!) 98/56 94/65  Pulse: 94  (!) 134 (!) 127  Resp: '18 16 18 18  '$ Temp: (!) 97.4 F (36.3 C)  97.8 F (36.6 C) (!) 97.4 F (36.3 C)  TempSrc: Oral  Oral Oral  SpO2: 94%  92% 99%  Weight:      Height:       Neurology awake and alert ENT with mild pallor Cardiovascular with S1 and S2 present, tachycardic, positive S3 gallops, no murmurs Positive JVD Positive lower extremity edema ++ pitting Respiratory with rales bilaterally, no wheezing Abdomen not distended  Data Reviewed:    Family Communication: no family at the bedside   Disposition: Status is: Inpatient Remains inpatient appropriate because: heart failure   Planned Discharge Destination: Home    Author: Tawni Millers, MD 02/26/2022 9:30 AM  For on call review [email or web address].

## 2022-02-26 NOTE — Progress Notes (Signed)
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  Allergies  Allergen Reactions   Allopurinol Shortness Of Breath   Doxycycline Hives   Farxiga [Dapagliflozin]     Can not tolerate - dizzy weak almost passed out    Patient Measurements: Height: '5\' 11"'$  (180.3 cm) Weight: 85.7 kg (189 lb) IBW/kg (Calculated) : 75.3  Vital Signs: Temp: 97.8 F (36.6 C) (06/19 0443) Temp Source: Oral (06/19 0443) BP: 98/56 (06/19 0443) Pulse Rate: 134 (06/19 0443)  Labs: Recent Labs    02/24/22 0236 02/25/22 0300 02/26/22 0452  HGB  --  12.8* 11.9*  HCT  --  39.8 36.3*  PLT  --  164 158  LABPROT 39.3* 34.0* 28.5*  INR 4.1* 3.4* 2.7*  CREATININE  --  4.20* 4.32*     Estimated Creatinine Clearance: 17.4 mL/min (A) (by C-G formula based on SCr of 4.32 mg/dL (H)).   Assessment: 71 y/o M presents to the ED with a few weeks of increased confusion. 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 Patient had Rheumatic Fever in 1965 with resultant Rheumatic Heart Disease requiring a MVR in 2002. There was no bleed on CT of the head which was completed on 02/22/2022  Home dose of coumadin was recently changed [01/2022] to 2.5 mg daily His last dose of coumadin was on 02/21/2022 per patient and significant other.  INR down to 2.7  Goal of Therapy:  INR 2.5-3.5 Monitor platelets by anticoagulation protocol: Yes   Plan:  Give warfarin 5 mg today Daily PT/INR, resume warfarin as INR allows  Thank you for allowing pharmacy to participate in this patient's care.  Thank you Anette Guarneri, PharmD 02/26/2022 7:43 AM Check [email or web address] for unit specific pharmacy number

## 2022-02-26 NOTE — Plan of Care (Signed)
°  Problem: Clinical Measurements: °Goal: Ability to maintain clinical measurements within normal limits will improve °Outcome: Progressing °Goal: Diagnostic test results will improve °Outcome: Progressing °Goal: Cardiovascular complication will be avoided °Outcome: Progressing °  °

## 2022-02-26 NOTE — TOC Progression Note (Signed)
Transition of Care [facility]) - Progression Note    Patient Details  Name: John [name] MRN: [id number] Date of Birth: [birth year]  Transition of Care [facility]) CM/SW Contact  Zenon Mayo, RN Phone Number: 02/26/2022, 4:42 PM  Clinical Narrative:    He is set up with [facility] fro HHPT, HR 130- 140, afib RVR, conts on milrinone drip, and amio drip, TOC will continue to follow for dc needs.    Expected Discharge Plan: [place] Barriers to Discharge: Continued Medical Work up  Expected Discharge Plan and Services Expected Discharge Plan: [place]   Discharge Planning Services: CM Consult Post Acute Care Choice: [place] arrangements for the past 2 months: Single Family Home                   DME Agency: NA       HH Arranged: PT HH Agency: [place] Date [place]: 02/23/22 Time [place]: 1656 Representative spoke with at [place]: [place] ([place]) Interventions    Readmission Risk Interventions    02/23/2022    4:55 PM  Readmission Risk Prevention Plan  Transportation Screening Complete  HRI or [place] Complete  Social Work Consult for [place] Planning/Counseling Complete  Palliative Care Screening Not Applicable  Medication Review Press photographer) Complete

## 2022-02-26 NOTE — Progress Notes (Signed)
Occupational Therapy Treatment Patient Details Name: John [name] MRN: [id number] DOB: 1951-03-26 Today's Date: 02/26/2022   History of present illness The pt is a 71 yo male with increased confusion. 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 Chest: b/l small pleural effusion. PMH includes: arthritis, afib on Coumadin, CKD III, CAD s/p CABG 2002, CHF, GERD, HLD, HTN, Rheumatic heart disease, MVR, and DM II.   OT comments  Patient seated on EOB with nursing assisting with bathing. Patient was setup for UB dressing seated on EOB for donning gown and BP was 100/63. Patient ambulated to sink and performed grooming with setup and after returning to EOB his BP 89/63. Patient asked to return to supine due to fatigue and BP was 88/63 in supine. Patient provided handout for energy conservation strategies and reviewed with patient. Acute OT to continue to follow.    Recommendations for follow up therapy are one component of a multi-disciplinary discharge planning process, led by the attending physician.  Recommendations may be updated based on patient status, additional functional criteria and insurance authorization.    Follow Up Recommendations  No OT follow up    Assistance Recommended at Discharge Set up Supervision/Assistance  Patient can return home with the following  A little help with bathing/dressing/bathroom   Equipment Recommendations  BSC/3in1    Recommendations for Other Services      Precautions / Restrictions Precautions Precautions: Fall Restrictions Weight Bearing Restrictions: No       Mobility Bed Mobility Overal bed mobility: Needs Assistance Bed Mobility: Sit to Supine       Sit to supine: Supervision   General bed mobility comments: seated on EOB upon entry. Returned to supine following self care tasks.    Transfers Overall transfer level: Needs assistance Equipment used: Rolling walker (2  wheels) Transfers: Sit to/from Stand Sit to Stand: Supervision           General transfer comment: no physical assist required     Balance Overall balance assessment: Mild deficits observed, not formally tested                                         ADL either performed or assessed with clinical judgement   ADL Overall ADL's : At baseline                                       General ADL Comments: Donned gown seated on EOB and stood at sink for grooming tasks.    Extremity/Trunk Assessment              Vision       Perception     Praxis      Cognition Arousal/Alertness: Awake/alert Behavior During Therapy: WFL for tasks assessed/performed Overall Cognitive Status: Within Functional Limits for tasks assessed                                          Exercises      Shoulder Instructions       General Comments BP seated on EOB 100/63; following standing at sink for grooming 89/63; once in supine 88/63.  Provided patient with handout for  energy conservation and reviewed with patient .    Pertinent Vitals/ Pain       Pain Assessment Pain Assessment: No/denies pain  Home Living                                          Prior Functioning/Environment              Frequency  Min 2X/week        Progress Toward Goals  OT Goals(current goals can now be found in the care plan section)  Progress towards OT goals: Progressing toward goals  Acute Rehab OT Goals Patient Stated Goal: go home OT Goal Formulation: With patient Time For Goal Achievement: 03/09/22 Potential to Achieve Goals: Good ADL Goals Pt Will Perform Lower Body Dressing: with min assist;sit to/from stand Pt Will Perform Toileting - Clothing Manipulation and hygiene: sit to/from stand;with min guard assist Additional ADL Goal #1: Pt will state 3 energy conservation strategies in order to increase independence at  home.  Plan Discharge plan remains appropriate    Co-evaluation                 AM-PAC OT "6 Clicks" Daily Activity     Outcome Measure   Help from another person eating meals?: None Help from another person taking care of personal grooming?: None Help from another person toileting, which includes using toliet, bedpan, or urinal?: A Little Help from another person bathing (including washing, rinsing, drying)?: A Little Help from another person to put on and taking off regular upper body clothing?: None Help from another person to put on and taking off regular lower body clothing?: A Little 6 Click Score: 21    End of Session Equipment Utilized During Treatment: Rolling walker (2 wheels)  OT Visit Diagnosis: Unsteadiness on feet (R26.81);Muscle weakness (generalized) (M62.81)   Activity Tolerance Patient tolerated treatment well   Patient Left in bed;with call bell/phone within reach   Nurse Communication Mobility status;Other (comment) (BP readings)        Time: 1035-1100 OT Time Calculation (min): 25 min  Charges: OT General Charges $OT Visit: 1 Visit OT Treatments $Self Care/Home Management : 8-22 mins $Therapeutic Activity: 8-22 mins  Lodema Hong, OTA Acute Rehabilitation Services  Office [place] 02/26/2022, 1:05 PM

## 2022-02-26 NOTE — Progress Notes (Signed)
[facility] Progress Note    Assessment/ Plan:   Acute kidney injury on CKD3b - baseline creatinine appears have progressed since 2021 and is currently ~2-2.5. Urinalysis shows minimal proteinuria and does not display an active sediment. Renal function appears to be progressively worse since 01/31/22 which frequent variation. Ultrasound 6/16 neg for obstructive uropathy. AKI likely from [place]. - He appears to be overloaded with a JVD, lower extremity edema and weight is ~84.9 kg @ admission. His weight was 81kg at the cardiology appt in late May 2023. Also his wife said his appetite has not been good overall. Weight now up 1.3kg since hospitalization with worsening of renal function c/w CRS. - continue diuresis with IV Lasix and metolazone - on milrinone now - Cr is about the same, BUN slightly lower - added strict I/O - increased midodrine to 15 TID -Maintain MAP>65 for optimal renal perfusion.  - Avoid nephrotoxic medications including NSAIDs and iodinated intravenous contrast exposure unless the latter is absolutely indicated.   - Preferred narcotic agents for pain control are hydromorphone, fentanyl, and methadone. Morphine should not be used.  - Avoid Baclofen and avoid oral sodium phosphate and magnesium citrate based laxatives / bowel preps.  - if no improvement in the next 24-48 hrs- would consider pall care c/s     Encephalopathy - could this be from slowly worsening renal function? CT shows only a small chronic infarct which is new. HFrEF - 20-25%  DM New infarct in high right parietal lobe which is new. Atrial fibrillation with a supratherapeutic INR Diffuse large cell lymphoma (diagnosed in 2014, status post R-CHOP in 2014, now in remission) 8.  AS: not a TAVR candidate per notes  Subjective:    Started milrinone yesterday.  Pressures still soft, tachy as well.  About to work with OT   Objective:   BP 93/65   Pulse (!) 127   Temp (!) 97.4 F (36.3 C)  (Oral)   Resp 18   Ht '5\' 11"'$  (1.803 m)   Wt 85.7 kg   SpO2 99%   BMI 26.36 kg/m   Intake/Output Summary (Last 24 hours) at 02/26/2022 1155 Last data filed at 02/26/2022 0810 Gross per 24 hour  Intake 633.45 ml  Output 0 ml  Net 633.45 ml   Weight change:   Physical Exam: Gen:NAD, lying in bed CVS: tachy, irregular Resp: clear anteriorly Abd: soft Ext: 1+ LE edema 1+ UE edema with lots of bruising NEURO: AAO X 3 today, no asterixis, does seem to take longer than expected to process information  Imaging: DG CHEST PORT 1 VIEW  Result Date: 02/25/2022 CLINICAL DATA:  PICC line placement. EXAM: PORTABLE CHEST 1 VIEW COMPARISON:  One-view chest x-ray 02/22/2022 FINDINGS: The heart is enlarged. Atherosclerotic calcifications are present at the aortic arch. Interval left-sided PICC line placement noted. The tip is at the cavoatrial junction. Single AICD lead is stable. Mild edema and bilateral pleural effusions have increased. Bibasilar airspace opacity likely reflects atelectasis. IMPRESSION: No active disease. Electronically Signed   By: San Morelle M.D.   On: 02/25/2022 12:33   [place] EKG SITE RITE  Result Date: 02/25/2022 If Site Rite image not attached, placement could not be confirmed due to current cardiac rhythm.   Labs: BMET Recent Labs  Lab 02/22/22 1427 02/23/22 0453 02/25/22 0300 02/26/22 0452  NA 135 137 135 135  K 3.3* 3.6 3.8 3.7  CL 93* 94* 95* 94*  CO2 25 27 21* 24  GLUCOSE 231*  181* 149* 173*  BUN 129* 133* 141* 136*  CREATININE 4.04* 4.15* 4.20* 4.32*  CALCIUM 9.7 8.7* 8.6* 8.5*  PHOS  --  4.5 5.1* 4.6   CBC Recent Labs  Lab 02/22/22 1427 02/23/22 0453 02/25/22 0300 02/26/22 0452  WBC 4.6 3.8* 4.2 4.4  NEUTROABS  --  2.7  --   --   HGB 12.6* 12.0* 12.8* 11.9*  HCT 40.1 38.4* 39.8 36.3*  MCV 86.2 87.5 86.3 84.6  PLT 170 148* 164 158    Medications:     Chlorhexidine Gluconate Cloth  6 each Topical Daily   furosemide  80 mg Intravenous  Q12H   insulin aspart  0-5 Units Subcutaneous QHS   insulin aspart  0-9 Units Subcutaneous TID WC   midodrine  10 mg Oral TID WC   pantoprazole  40 mg Oral Daily   rosuvastatin  40 mg Oral QODAY   sodium chloride flush  10-40 mL Intracatheter Q12H   warfarin  5 mg Oral ONCE-1600   Warfarin - Pharmacist Dosing Inpatient   Does not apply q1600      Madelon Lips, MD 02/26/2022, 11:55 AM

## 2022-02-26 NOTE — Progress Notes (Signed)
   02/26/22 0800  Vitals  Temp (!) 97.4 F (36.3 C)  Temp Source Oral  BP 94/65  MAP (mmHg) 75  BP Location Right Arm  BP Method Automatic  Patient Position (if appropriate) Sitting  Pulse Rate (!) 127  Pulse Rate Source Monitor  Resp 18  MEWS COLOR  MEWS Score Color Yellow  Oxygen Therapy  SpO2 99 %  O2 Device Room Air  Pain Assessment  Pain Scale 0-10  Pain Score 0  MEWS Score  MEWS Temp 0  MEWS Systolic 1  MEWS Pulse 2  MEWS RR 0  MEWS LOC 0  MEWS Score 3   Patient reverted back to A Fib with RVR moving from lying to sitting on the edge of the bed to eat breakfast during shift change. Patient states "I feel fine" and is not symptomatic. Night shift performed EKG and VS is a yellow MEWS due to elevated HR sustaining 120s-140s. Cardiology paged and spoke to John [name]. SBP is mid 90s at baseline. Patient takes midodrine. Plan of care continues.

## 2022-02-27 DIAGNOSIS — I482 Chronic atrial fibrillation, unspecified: Secondary | ICD-10-CM | POA: Diagnosis not present

## 2022-02-27 DIAGNOSIS — K219 Gastro-esophageal reflux disease without esophagitis: Secondary | ICD-10-CM | POA: Diagnosis not present

## 2022-02-27 DIAGNOSIS — I5023 Acute on chronic systolic (congestive) heart failure: Secondary | ICD-10-CM | POA: Diagnosis not present

## 2022-02-27 DIAGNOSIS — N179 Acute kidney failure, unspecified: Secondary | ICD-10-CM | POA: Diagnosis not present

## 2022-02-27 LAB — GLUCOSE, CAPILLARY
Glucose-Capillary: 160 mg/dL — ABNORMAL HIGH (ref 70–99)
Glucose-Capillary: 191 mg/dL — ABNORMAL HIGH (ref 70–99)
Glucose-Capillary: 225 mg/dL — ABNORMAL HIGH (ref 70–99)

## 2022-02-27 LAB — BASIC METABOLIC PANEL
Anion gap: 14 (ref 5–15)
BUN: 141 mg/dL — ABNORMAL HIGH (ref 8–23)
CO2: 26 mmol/L (ref 22–32)
Calcium: 8.5 mg/dL — ABNORMAL LOW (ref 8.9–10.3)
Chloride: 95 mmol/L — ABNORMAL LOW (ref 98–111)
Creatinine, Ser: 4.16 mg/dL — ABNORMAL HIGH (ref 0.61–1.24)
GFR, Estimated: 15 mL/min — ABNORMAL LOW (ref >60)
Glucose, Bld: 149 mg/dL — ABNORMAL HIGH (ref 70–99)
Potassium: 3.6 mmol/L (ref 3.5–5.1)
Sodium: 135 mmol/L (ref 135–145)

## 2022-02-27 LAB — COOXEMETRY PANEL
Carboxyhemoglobin: 1.8 % — ABNORMAL HIGH (ref 0.5–1.5)
Methemoglobin: <0.7 % (ref 0.0–1.5)
O2 Saturation: 63.4 %
Total hemoglobin: 11.4 g/dL — ABNORMAL LOW (ref 12.0–16.0)

## 2022-02-27 LAB — PROTIME-INR
INR: 2.8 — ABNORMAL HIGH (ref 0.8–1.2)
Prothrombin Time: 29.4 seconds — ABNORMAL HIGH (ref 11.4–15.2)

## 2022-02-27 MED ORDER — WARFARIN SODIUM 5 MG PO TABS
5.0000 mg | ORAL_TABLET | Freq: Once | ORAL | Status: AC
  Administered 2022-02-27: 5 mg via ORAL
  Filled 2022-02-27: qty 1

## 2022-02-27 MED ORDER — FUROSEMIDE 10 MG/ML IJ SOLN
8.0000 mg/h | INTRAVENOUS | Status: DC
  Administered 2022-02-27 – 2022-02-28 (×2): 8 mg/h via INTRAVENOUS
  Filled 2022-02-27 (×3): qty 20

## 2022-02-27 MED ORDER — FUROSEMIDE 10 MG/ML IJ SOLN
120.0000 mg | Freq: Three times a day (TID) | INTRAMUSCULAR | Status: DC
Start: 2022-02-27 — End: 2022-02-27
  Filled 2022-02-27 (×3): qty 12

## 2022-02-27 NOTE — Care Management Important Message (Signed)
Important Message  Patient Details  Name: John [name] MRN: [id number] Date of Birth: [birth year]   Medicare Important Message Given:  Yes     [name] 02/27/2022, 9:04 AM

## 2022-02-27 NOTE — Progress Notes (Signed)
Physical Therapy Treatment Patient Details Name: John [name] MRN: [id number] DOB: [birth year] Today's Date: 02/27/2022   History of Present Illness The pt is a 71 yo male with increased confusion. 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 Chest: b/l small pleural effusion. PMH includes: arthritis, afib on Coumadin, CKD III, CAD s/p CABG 2002, CHF, GERD, HLD, HTN, Rheumatic heart disease, MVR, and DM II.    PT Comments    Pt received seated EOB and agreeable to session. Pt with soft BP throughout session but without c/o dizziness and overall asymptomatic. Pt demonstrating transfers at supervision level with RW and increased tolerance for ambulation with RW and min guard for safety and line management. Pt requesting ambulation and transfers with RW despite encouragement for continued trials with no AD. Education provided re; energy conservation, activity recommendations, daily weights, and nutrition with pt and son verbalizing understanding. Pt continues to benefit from skilled PT services to progress toward functional mobility goals.   BP seated EOB: 82/51 BP standing: 80/45 BP after ambulation 79/51 HR: 106-119 during activity   Recommendations for follow up therapy are one component of a multi-disciplinary discharge planning process, led by the attending physician.  Recommendations may be updated based on patient status, additional functional criteria and insurance authorization.  Follow Up Recommendations  Home health PT     Assistance Recommended at Discharge Intermittent Supervision/Assistance  Patient can return home with the following A little help with walking and/or transfers;A little help with bathing/dressing/bathroom;Help with stairs or ramp for entrance   Equipment Recommendations  None recommended by PT    Recommendations for Other Services       Precautions / Restrictions Precautions Precautions:  Fall Required Braces or Orthoses:  (has bilateral Darco shoes however can not tell me why) Restrictions Weight Bearing Restrictions: No     Mobility  Bed Mobility Overal bed mobility: Needs Assistance             General bed mobility comments: seated on EOB upon entry.    Transfers Overall transfer level: Needs assistance Equipment used: Rolling walker (2 wheels) Transfers: Sit to/from Stand Sit to Stand: Supervision           General transfer comment: no physical assist required    Ambulation/Gait Ambulation/Gait assistance: Min guard Gait Distance (Feet): 175 Feet Assistive device: Rolling walker (2 wheels) Gait Pattern/deviations: Step-through pattern, Decreased step length - right, Decreased step length - left, Shuffle Gait velocity: slowed     General Gait Details: min guard for safety with slowed, shuffling gait, mild instability but no overt LOB, pt requesting use of RW   Stairs             Wheelchair Mobility    Modified Rankin (Stroke Patients Only)       Balance Overall balance assessment: Mild deficits observed, not formally tested                                          Cognition Arousal/Alertness: Awake/alert Behavior During Therapy: WFL for tasks assessed/performed, Flat affect Overall Cognitive Status: Within Functional Limits for tasks assessed                                          Exercises  General Exercises - Lower Extremity Ankle Circles/Pumps: AROM, Right, Left, 10 reps Toe Raises: Right, Left, 5 reps Heel Raises: Right, Left, 5 reps    General Comments General comments (skin integrity, edema, etc.): BP seated EOB 85/51, standing 80/45, after ambulation 79/51, pt asymptomatic throughout, HR 106-121 during activity      Pertinent Vitals/Pain Pain Assessment Pain Assessment: No/denies pain    Home Living                          Prior Function            PT  Goals (current goals can now be found in the care plan section) Acute Rehab PT Goals Patient Stated Goal: go home PT Goal Formulation: With patient Time For Goal Achievement: 03/09/22    Frequency    Min 3X/week      PT Plan      Co-evaluation              AM-PAC PT "6 Clicks" Mobility   Outcome Measure  Help needed turning from your back to your side while in a flat bed without using bedrails?: None Help needed moving from lying on your back to sitting on the side of a flat bed without using bedrails?: None Help needed moving to and from a bed to a chair (including a wheelchair)?: None Help needed standing up from a chair using your arms (e.g., wheelchair or bedside chair)?: A Little Help needed to walk in hospital room?: A Little Help needed climbing 3-5 steps with a railing? : A Lot 6 Click Score: 20    End of Session Equipment Utilized During Treatment: Gait belt Activity Tolerance: Patient tolerated treatment well Patient left: with call bell/phone within reach;in bed;with family/visitor present (seated EOB) Nurse Communication: Mobility status PT Visit Diagnosis: Other abnormalities of gait and mobility (R26.89);Muscle weakness (generalized) (M62.81);Difficulty in walking, not elsewhere classified (R26.2)     Time: 0175-1025 PT Time Calculation (min) (ACUTE ONLY): 25 min  Charges:  $Gait Training: 8-22 mins $Therapeutic Activity: 8-22 mins                     John [name] R. PTA Acute Rehabilitation Services Office: [place] 02/27/2022, 10:18 AM

## 2022-02-27 NOTE — Progress Notes (Signed)
Patient states he needs the dressings on his legs changed, but there are no orders. Patient states his wife can do them tomorrow.

## 2022-02-27 NOTE — Progress Notes (Signed)
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  Allergies  Allergen Reactions   Allopurinol Shortness Of Breath   Doxycycline Hives   Farxiga [Dapagliflozin]     Can not tolerate - dizzy weak almost passed out    Patient Measurements: Height: '5\' 11"'$  (180.3 cm) Weight: 87.3 kg (192 lb 6.4 oz) IBW/kg (Calculated) : 75.3  Vital Signs: Temp: 97.5 F (36.4 C) (06/20 0400) Temp Source: Oral (06/20 0400) BP: 97/60 (06/20 0638) Pulse Rate: 50 (06/20 0022)  Labs: Recent Labs    02/25/22 0300 02/26/22 0452 02/27/22 0427  HGB 12.8* 11.9*  --   HCT 39.8 36.3*  --   PLT 164 158  --   LABPROT 34.0* 28.5* 29.4*  INR 3.4* 2.7* 2.8*  CREATININE 4.20* 4.32* 4.16*     Estimated Creatinine Clearance: 18.1 mL/min (A) (by C-G formula based on SCr of 4.16 mg/dL (H)).   Assessment: 71 y/o M presents to the ED with a few weeks of increased confusion. 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 Patient had Rheumatic Fever in 1965 with resultant Rheumatic Heart Disease requiring a MVR in 2002. There was no bleed on CT of the head which was completed on 02/22/2022  Home dose of coumadin was recently changed [01/2022] to 2.5 mg daily His last dose of coumadin was on 02/21/2022 per patient and significant other.  INR 2.8 today  Goal of Therapy:  INR 2.5-3.5 Monitor platelets by anticoagulation protocol: Yes   Plan:  Repeat warfarin 5 mg today Daily PT/INR, resume warfarin as INR allows  Thank you for allowing pharmacy to participate in this patient's care.  Thank you Anette Guarneri, PharmD 02/27/2022 7:37 AM Check [email or web address] for unit specific pharmacy number

## 2022-02-27 NOTE — Consult Note (Signed)
WOC Nurse Consult Note: Reason for Consult: bilateral lower extremity wounds  Pt with chronic venous stasis and lymphedema followed by wound care clinic. Wife performs every other day wound care at home.   Wound type: venous ulcerations Pressure Injury POA: NA Measurement:1. Left proximal medial malleolar; 5cm x4cm x0.1cm; clean, pink wound bed with minimal serosanguineous output 2. Left distal medial malleolar; 3cm x2cm x0.1cm; clean, pink wound bed with minimal serosanguineous drainage 3. Right pretibial; 5cm x2.5cm x 0.1cm; clean, pink wound bed with thick serosanguineous drainage 4. Right posterior proximal medial malleolar; 3cm x2cm x0.1cm ; pale, pink wound bed with minimal serous drainage 5. Right distal medial malleolar; 6cm x 2cm x0.1;cm pale pink wound bed with minimal serous drainage Wound bed:See above Drainage: See above Periwound: bilateral hemosiderin staining, minimal edema bilaterally to feet Dressing procedure/placement/frequency: 1. Cut to fit calcium alginate ([facility]# [id number]) place over wounds  2. To with ABD pads  3. Wrap with kerlix  4. Wrap with Myrle Sheng Kellie Simmering [phone])   PLEASE NOTE: Wrap pt from ankle to mid tibial region. Do not include foot.  Wife performs wound care at home and is currently wrapping from malleolar to pretibial region. This is not considered therapeutic for venous disease. Will leave with current plan of care. Follow up with outpatient wound care clinic as schedule.   WOC Joneen Roach, RN, WTA Discussed POC with patient and bedside nurse.  Re consult if needed, will not follow at this time. Thanks  Rachelann Enloe [employer], RN,CWOCN, CNS, [place] [phone])

## 2022-02-27 NOTE — Progress Notes (Addendum)
Progress Note  Patient Name: John [name] Date of Encounter: 02/27/2022  [facility] Cardiologist: Minus Breeding, MD   Subjective   Less able to interact, resp rate a little higher.  Inpatient Medications    Scheduled Meds:  Chlorhexidine Gluconate Cloth  6 each Topical Daily   insulin aspart  0-5 Units Subcutaneous QHS   insulin aspart  0-9 Units Subcutaneous TID WC   metolazone  5 mg Oral Daily   midodrine  15 mg Oral TID WC   pantoprazole  40 mg Oral Daily   rosuvastatin  40 mg Oral QODAY   sodium chloride flush  10-40 mL Intracatheter Q12H   warfarin  5 mg Oral ONCE-1600   Warfarin - Pharmacist Dosing Inpatient   Does not apply q1600   Continuous Infusions:  amiodarone 30 mg/hr (02/27/22 0407)   furosemide     milrinone 0.25 mcg/kg/min (02/27/22 0646)   PRN Meds: acetaminophen, sodium chloride flush   Vital Signs    Vitals:   02/27/22 0638 02/27/22 0800 02/27/22 0821 02/27/22 1043  BP: 97/60 (!) 93/54 (!) 95/51 91/62  Pulse:   (!) 120 (!) 104  Resp:   16 17  Temp:    (!) 97.4 F (36.3 C)  TempSrc:    Oral  SpO2:   100% 93%  Weight:      Height:        Intake/Output Summary (Last 24 hours) at 02/27/2022 1150 Last data filed at 02/27/2022 0820 Gross per 24 hour  Intake 1437.28 ml  Output 650 ml  Net 787.28 ml      02/27/2022    4:09 AM 02/25/2022    4:58 AM 02/24/2022    4:01 AM  Last 3 Weights  Weight (lbs) 192 lb 6.4 oz 189 lb 187 lb 1.6 oz  Weight (kg) 87.272 kg 85.73 kg 84.868 kg      Telemetry    Aflutter, RVR 120s, 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   Neck:  JVD to jaw Cardiac: Irreg R&R, +valve click, + murmurs, rubs, or gallops.  Respiratory: few rales bases bilaterally. GI: Soft, nontender, non-distended  MS: No edema; No deformity. Neuro:  Nonfocal, very weak, no good response to questions Psych: Normal affect    Labs    High Sensitivity Troponin:   Recent Labs  Lab 02/22/22 1427 02/22/22 1633  TROPONINIHS 54* 61*        Latest Ref Rng & Units 12/01/2021    8:30 AM 12/02/2021    5:15 AM 02/25/2022   12:40 PM 02/26/2022    4:52 AM 02/27/2022    4:15 AM  Total Hgb  Total Hgb 12.0 - 16.0 g/dL 10.4  11.4  12.6  11.8  11.4       Latest Ref Rng & Units 12/01/2021    8:30 AM 12/02/2021    5:15 AM 02/25/2022   12:40 PM 02/26/2022    4:52 AM 02/27/2022    4:15 AM  O2 Sat  O2 Sat % 63.6  59.3  52.2  65.6  63.4       Latest Ref Rng & Units 12/01/2021    8:30 AM 12/02/2021    5:15 AM 02/25/2022   12:40 PM 02/26/2022    4:52 AM 02/27/2022    4:15 AM  Carboxy Hgb  Carboxy Hgb 0.5 - 1.5 % 0.9  1.2  1.1  1.3  1.8       Latest Ref Rng & Units 12/01/2021    8:30 AM 12/02/2021    5:15 AM 02/25/2022   12:40 PM 02/26/2022    4:52 AM 02/27/2022    4:15 AM  Met Hgb  Met Hgb 0.0 - 1.5 % <0.7  <0.7  <0.7  <0.7  <0.7       12/01/2021   11:35 AM 12/01/2021    4:00 PM 12/02/2021   12:00 AM 12/02/2021    3:46 AM 12/02/2021    8:00 AM  CVP  CVP (mmHg) 7 mmHg 7 mmHg 6 mmHg 7 mmHg 6 mmHg    Chemistry Recent Labs  Lab 02/22/22 1427 02/22/22 1633 02/23/22 0453 02/25/22 0300 02/26/22 0452 02/27/22 0427  NA 135  --  137 135 135 135  K 3.3*  --  3.6 3.8 3.7 3.6  CL 93*  --  94* 95* 94* 95*  CO2 25  --  27 21* 24 26  GLUCOSE 231*  --  181* 149* 173* 149*  BUN 129*  --  133* 141* 136* 141*  CREATININE 4.04*  --  4.15* 4.20* 4.32* 4.16*  CALCIUM 9.7  --  8.7* 8.6* 8.5* 8.5*  MG  --  2.5* 2.6*  --   --   --   PROT 6.6  --  6.0*  --   --   --   ALBUMIN 3.4*  --  2.7* 2.8* 2.6*  --   AST 27  --  25  --   --   --   ALT 13  --  16  --   --   --   ALKPHOS 323*  --  304*  --   --   --   BILITOT 2.1*  --  2.0*  --   --   --   GFRNONAA 15*  --  15* 15* 14* 15*  ANIONGAP 17*  --  16* 19* 17* 14    Lipids No results for input(s): "CHOL", "TRIG", "HDL", "LABVLDL", "LDLCALC", "CHOLHDL" in the last 168 hours.   Hematology Recent Labs  Lab 02/23/22 0453 02/25/22 0300 02/26/22 0452  WBC 3.8* 4.2 4.4  RBC 4.39 4.61 4.29  HGB 12.0* 12.8* 11.9*  HCT 38.4* 39.8 36.3*  MCV 87.5 86.3 84.6  MCH 27.3 27.8 27.7  MCHC 31.3 32.2 32.8  RDW 17.4* 17.6* 17.6*  PLT 148* 164 158   Thyroid No results for input(s): "TSH", "FREET4" in the last 168 hours.  BNP Recent Labs  Lab 02/22/22 1426  BNP 3,004.6*    DDimer No results for input(s): "DDIMER" in the last 168 hours.   Lab Results  Component Value Date   INR 2.8 (H) 02/27/2022   INR 2.7 (H) 02/26/2022   INR 3.4 (H) 02/25/2022     Radiology    ECHOCARDIOGRAM LIMITED  Result Date: 02/26/2022    ECHOCARDIOGRAM LIMITED REPORT   Patient Name:   John [name] Date of Exam: 02/26/2022 Medical Rec #:  [id number]        Height:       71.0 in Accession #:    [id number]       Weight:       189.0 lb Date of Birth:  Dec 08, 1950        BSA:          2.058 m Patient Age:    71 years         BP:  98/68 mmHg Patient Gender: M                HR:           120 bpm. Exam Location:  Inpatient Procedure: 2D Echo, Limited Echo, Cardiac Doppler, Color Doppler and            Intracardiac Opacification Agent Indications:    Dyspnea  History:        Patient has prior history of Echocardiogram examinations. CHF,                 CAD, Arrythmias:Atrial Fibrillation; Risk Factors:Hypertension                 and Diabetes.                  Mitral Valve: mechanical valve valve is present in the mitral                 position.  Sonographer:    Jyl Heinz Referring Phys: [id number] Moodus  1. Left ventricular ejection fraction, by estimation, is <20%. The left ventricle has severely decreased function. The left ventricle demonstrates global hypokinesis. The left ventricular internal cavity size was severely dilated. Left ventricular diastolic parameters are indeterminate. There is the interventricular septum is flattened in systole, consistent with  right ventricular pressure overload.  2. Right ventricular systolic function is severely reduced. There is moderately elevated pulmonary artery systolic pressure.  3. Left atrial size was severely dilated.  4. Right atrial size was moderately dilated.  5. The mitral valve has been repaired/replaced. The mean mitral valve gradient is 4.7 mmHg. There is a mechanical valve present in the mitral position. Echo findings are consistent with normal structure and function of the mitral valve prosthesis.  6. Tricuspid valve regurgitation is mild to moderate.  7. Aotic valve is heavily calcified and barely opens. Suspect severe low-flow/low-gradient AS     . The aortic valve is tricuspid. There is severe calcifcation of the aortic valve. Aortic valve regurgitation is mild. Aortic regurgitation PHT measures 207 msec. Aortic valve area, by VTI measures 1.27 cm. Aortic valve mean gradient measures 9.8 mmHg. Aortic valve Vmax measures 2.11 m/s.  8. The inferior vena cava is dilated in size with <50% respiratory variability, suggesting right atrial pressure of 15 mmHg. FINDINGS  Left Ventricle: Left ventricular ejection fraction, by estimation, is <20%. The left ventricle has severely decreased function. The left ventricle demonstrates global hypokinesis. Definity contrast agent was given IV to delineate the left ventricular endocardial borders. The left ventricular internal cavity size was severely dilated. The interventricular septum is flattened in systole, consistent with right ventricular pressure overload. Left ventricular diastolic parameters are indeterminate. Right Ventricle: Right ventricular systolic function is severely reduced. There is moderately elevated pulmonary artery systolic pressure. Left Atrium: Left atrial size was severely dilated. Right Atrium: Right atrial size was moderately dilated. Mitral Valve: The mitral valve has been repaired/replaced. There is a mechanical valve present in the mitral position.  Echo findings are consistent with normal structure and function of the mitral valve prosthesis. MV peak gradient, 13.6 mmHg. The mean mitral valve gradient is 4.7 mmHg. Tricuspid Valve: Tricuspid valve regurgitation is mild to moderate. Aortic Valve: Aotic valve is heavily calcified and barely opens. Suspect severe low-flow/low-gradient AS. The aortic valve is tricuspid. There is severe calcifcation of the aortic valve. Aortic valve regurgitation is mild. Aortic regurgitation PHT measures 207 msec. Aortic valve mean gradient measures 9.8 mmHg.  Aortic valve peak gradient measures 17.9 mmHg. Aortic valve area, by VTI measures 1.27 cm. Pulmonic Valve: Pulmonic valve regurgitation is trivial. Venous: The inferior vena cava is dilated in size with less than 50% respiratory variability, suggesting right atrial pressure of 15 mmHg. Additional Comments: A device lead is visualized. LEFT VENTRICLE PLAX 2D LVIDd:         6.70 cm      Diastology LVIDs:         6.30 cm      LV e' medial:    3.38 cm/s LV PW:         1.20 cm      LV E/e' medial:  42.6 LV IVS:        1.00 cm      LV e' lateral:   5.98 cm/s LVOT diam:     2.20 cm      LV E/e' lateral: 24.1 LV SV:         39 LV SV Index:   19 LVOT Area:     3.80 cm  LV Volumes (MOD) LV vol d, MOD A2C: 376.0 ml LV vol d, MOD A4C: 350.0 ml LV vol s, MOD A2C: 302.0 ml LV vol s, MOD A4C: 286.0 ml LV SV MOD A2C:     74.0 ml LV SV MOD A4C:     350.0 ml LV SV MOD BP:      74.3 ml RIGHT VENTRICLE            IVC RV S prime:     5.61 cm/s  IVC diam: 2.50 cm TAPSE (M-mode): 1.0 cm LEFT ATRIUM         Index LA diam:    6.00 cm 2.91 cm/m  AORTIC VALVE AV Area (Vmax):    1.20 cm AV Area (Vmean):   1.15 cm AV Area (VTI):     1.27 cm AV Vmax:           211.25 cm/s AV Vmean:          150.500 cm/s AV VTI:            0.309 m AV Peak Grad:      17.9 mmHg AV Mean Grad:      9.8 mmHg LVOT Vmax:         66.50 cm/s LVOT Vmean:        45.600 cm/s LVOT VTI:          0.103 m LVOT/AV VTI ratio: 0.33 AI  PHT:            207 msec  AORTA Ao Root diam: 3.50 cm Ao Asc diam:  3.00 cm MITRAL VALVE                TRICUSPID VALVE MV Area (PHT): 7.09 cm     TR Peak grad:   45.7 mmHg MV Area VTI:   1.31 cm     TR Vmax:        338.00 cm/s MV Peak grad:  13.6 mmHg MV Mean grad:  4.7 mmHg     SHUNTS MV Vmax:       1.84 m/s     Systemic VTI:  0.10 m MV Vmean:      97.6 cm/s    Systemic Diam: 2.20 cm MV Decel Time: 107 msec MV E velocity: 144.00 cm/s Glori Bickers MD Electronically signed by Glori Bickers MD Signature Date/Time: 02/26/2022/3:33:19 PM    Final    DG CHEST PORT 1 VIEW  Result Date: 02/25/2022 CLINICAL DATA:  PICC line placement. EXAM: PORTABLE CHEST 1 VIEW COMPARISON:  One-view chest x-ray 02/22/2022 FINDINGS: The heart is enlarged. Atherosclerotic calcifications are present at the aortic arch. Interval left-sided PICC line placement noted. The tip is at the cavoatrial junction. Single AICD lead is stable. Mild edema and bilateral pleural effusions have increased. Bibasilar airspace opacity likely reflects atelectasis. IMPRESSION: No active disease. Electronically Signed   By: San Morelle M.D.   On: 02/25/2022 12:33    Cardiac Studies    ECHO:  02/26/2022  1. Left ventricular ejection fraction, by estimation, is <20%. The left  ventricle has severely decreased function. The left ventricle demonstrates  global hypokinesis. The left ventricular internal cavity size was severely  dilated. Left ventricular diastolic parameters are indeterminate. There is the interventricular septum is flattened in systole, consistent with right ventricular pressure overload.   2. Right ventricular systolic function is severely reduced. There is  moderately elevated pulmonary artery systolic pressure.   3. Left atrial size was severely dilated.   4. Right atrial size was moderately dilated.   5. The mitral valve has been repaired/replaced. The mean mitral valve  gradient is 4.7 mmHg. There is a  mechanical valve present in the mitral  position. Echo findings are consistent with normal structure and function  of the mitral valve prosthesis.   6. Tricuspid valve regurgitation is mild to moderate.   7. Aotic valve is heavily calcified and barely opens. Suspect severe  low-flow/low-gradient AS. The aortic valve is tricuspid. There is severe calcifcation of the aortic valve. Aortic valve regurgitation is mild. Aortic regurgitation PHT measures 207 msec. Aortic valve area, by VTI measures 1.27 cm. Aortic valve mean gradient measures 9.8 mmHg. Aortic valve Vmax measures 2.11 m/s.   8. The inferior vena cava is dilated in size with <50% respiratory  variability, suggesting right atrial pressure of 15 mmHg.   2D echo 11/17/21  1. Left ventricular ejection fraction, by estimation, is 20 to 25%. The  left ventricle has severely decreased function. The left ventricle  demonstrates global hypokinesis. The left ventricular internal cavity size  was moderately dilated. Left  ventricular diastolic parameters are indeterminate. No LV thrombus.   2. Right ventricular systolic function is severely reduced. The right  ventricular size is normal. There is severely elevated pulmonary artery  systolic pressure. The estimated right ventricular systolic pressure is  93.9 mmHg.   3. Left atrial size was severely dilated.   4. Right atrial size was mildly dilated.   5. Moderate pleural effusion in the left lateral region.   6. The mitral valve has been replaced by a mechanical bileafelt valve. No  evidence of mitral valve regurgitation, no PVL.   7. Tricuspid valve regurgitation is moderate to severe. There is systolic  flow reversal of the hepatic vein.   8. The aortic valve is calcified. Aortic valve regurgitation is moderate.  Moderate aortic valve stenosis. Aortic valve mean gradient measures 15.0  mmHg. There is a component of low flow low gradient aortic stenosis.   9. The inferior vena cava is  dilated in size with >50% respiratory  variability, suggesting right atrial pressure of 8 mmHg.   Patient Profile     71 y.o. 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    Assessment & Plan    Acute on chronic CHF -  not on diuretic this admit, diuresis limited by hypotension and decreased renal function, cardiorenal syndrome - on milrinone, but HR, BUN still trending up, Cr slightly better, COOX improved and stable - Prior to admission was on Furoscix 80 mg, but he had stopped it. - Was also on sodium bicarb 650 mg twice daily -He is on midodrine 10 mg 3 times daily, started 6/18.  SBP still in the 90s - he was encephalopathic earlier this admission, but mental status had improved, today is worse than yesterday -Per Dr. Blenda Mounts note 6/18, if he does not turn around with milrinone will need to discuss palliative care  2.  Low-flow low gradient aortic stenosis - see echo report above, EF < 20%, mean Ao gradient 15 - Not a TAVR candidate  3.  CKD V -GFR today is 15, that is below his baseline, he is usually in the 20s - Creatinine peak 4.32, trending down now, he is usually in the high twos -Milrinone was started 6/18, BUN continues to trend up -He has been clear in his wishes that he does not want dialysis  4.  Atrial fib RVR - Beta-blocker held due to hypotension, has Bartlett ICD in place - Options are extremely limited  Otherwise, per IM  For questions or updates, please contact [facility] Please consult [email or web address] for contact info under      Signed, Kyle Luppino, PA-C   02/27/2022, 11:50 AM    History and all data above reviewed.  Patient examined.  I agree with the findings as above.   No progress in urine output.  His HR is slightly better with IV amio. The patient exam reveals UOR:[id number]   ,  Lungs: Decreased breath sounds at the bases  ,  Abd: Positive bowel sounds, no rebound no guarding, Ext Diffuse edema  .  All available labs, radiology testing, previous records reviewed. Agree with documented assessment and plan. Acute on chronic systolic and diastolic HF:  CoOx is unchanged.  I am going to try to increase the milrinone and change to continuous IV Lasix.  However, the prognosis is poor.  I did review previous notes from [facility] and there were not good long term options.  He does not want to consider dialysis and BP would probably preclude this.  I spoke briefly with the patient and his wife about these difficulties and the limitations of our therapy.  I spoke with Dr. Cathlean Sauer.  Palliative care consult would be appropriate.    Llewellyn Choplin  2:00 PM  02/27/2022

## 2022-02-27 NOTE — Plan of Care (Signed)
  Problem: Education: Goal: Knowledge of General Education information will improve Description Including pain rating scale, medication(s)/side effects and non-pharmacologic comfort measures Outcome: Progressing   Problem: Health Behavior/Discharge Planning: Goal: Ability to manage health-related needs will improve Outcome: Progressing   

## 2022-02-27 NOTE — Progress Notes (Signed)
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 Urinalysis shows minimal proteinuria and does not display an active sediment. Renal function appears to be progressively worse since 01/31/22 which frequent variation. Ultrasound 6/16 neg for obstructive uropathy. AKI likely from [place]. - He appears to be overloaded with a JVD, lower extremity edema and weight is ~84.9 kg @ admission. His weight was 81kg at the cardiology appt in late May 2023. Also his wife said his appetite has not been good overall. Weight now up 1.3kg since hospitalization with worsening of renal function c/w CRS. - not really improving - continue diuresis with IV Lasix and metolazone--> sluggish UOP- will increase Lasix but ? If this will help- may simply drive BUN up higher.  His TTE looks pretty bad- low flow AS with EF of < 20%.  He affirms with me today he doesn't want dialysis--> likely would not be able to tolerate it given cardiac function anyway.  Given the way things are going I think palliative care c/s for [place] discussion is appropriate at any time. - on milrinone now - added strict I/O - increased midodrine to 15 TID -Maintain MAP>65 for optimal renal perfusion.  - Avoid nephrotoxic medications including NSAIDs and iodinated intravenous contrast exposure unless the latter is absolutely indicated.   - Preferred narcotic agents for pain control are hydromorphone, fentanyl, and methadone. Morphine should not be used.  - Avoid Baclofen and avoid oral sodium phosphate and magnesium citrate based laxatives / bowel preps.      Encephalopathy - could this be from slowly worsening renal function? CT shows only a small chronic infarct which is new.  HFrEF - TTE 02/26/22 shows severe AS with a barely opening aortic valve (low flow/ output state), severely dilated LA, EF < 20%.  On  milrinone  DM  Atrial fibrillation with a supratherapeutic INR  Diffuse large cell lymphoma (diagnosed in 2014, status post R-CHOP in 2014, now in remission)  8.  AS: not a TAVR candidate per notes  9.  Dispo: [place] discussions appropriate at any time  Subjective:    Seen in room.  No real improvement and UOP is still sluggish.  Looks weak but doesn't complain of anything.     Objective:   BP 91/62 (BP Location: Right Arm)   Pulse (!) 104   Temp (!) 97.4 F (36.3 C) (Oral)   Resp 17   Ht '5\' 11"'$  (1.803 m)   Wt 87.3 kg   SpO2 93%   BMI 26.83 kg/m   Intake/Output Summary (Last 24 hours) at 02/27/2022 1141 Last data filed at 02/27/2022 0820 Gross per 24 hour  Intake 1437.28 ml  Output 650 ml  Net 787.28 ml   Weight change:   Physical Exam: Gen:NAD, lying in bed CVS: tachy, irregular, + click  Resp: clear anteriorly Abd: soft Ext: 2+ LE edema with open blistered wounds bilaterally, 1+ UE edema NEURO: AAO X 3 today, no asterixis, does seem to take longer than expected to process information  Imaging: ECHOCARDIOGRAM LIMITED  Result Date: 02/26/2022    ECHOCARDIOGRAM LIMITED REPORT   Patient Name:   John [name] Date of Exam: 02/26/2022 Medical Rec #:  [id number]        Height:       71.0 in Accession #:    [id number]       Weight:  189.0 lb Date of Birth:  [birth year]        BSA:          2.058 m Patient Age:    71 years         BP:           98/68 mmHg Patient Gender: M                HR:           120 bpm. Exam Location:  Inpatient Procedure: 2D Echo, Limited Echo, Cardiac Doppler, Color Doppler and            Intracardiac Opacification Agent Indications:    Dyspnea  History:        Patient has prior history of Echocardiogram examinations. CHF,                 CAD, Arrythmias:Atrial Fibrillation; Risk Factors:Hypertension                 and Diabetes.                  Mitral Valve: mechanical valve valve is present in the mitral                 position.  Sonographer:     Jyl Heinz Referring Phys: [id number] [facility]  1. Left ventricular ejection fraction, by estimation, is <20%. The left ventricle has severely decreased function. The left ventricle demonstrates global hypokinesis. The left ventricular internal cavity size was severely dilated. Left ventricular diastolic parameters are indeterminate. There is the interventricular septum is flattened in systole, consistent with right ventricular pressure overload.  2. Right ventricular systolic function is severely reduced. There is moderately elevated pulmonary artery systolic pressure.  3. Left atrial size was severely dilated.  4. Right atrial size was moderately dilated.  5. The mitral valve has been repaired/replaced. The mean mitral valve gradient is 4.7 mmHg. There is a mechanical valve present in the mitral position. Echo findings are consistent with normal structure and function of the mitral valve prosthesis.  6. Tricuspid valve regurgitation is mild to moderate.  7. Aotic valve is heavily calcified and barely opens. Suspect severe low-flow/low-gradient AS     . The aortic valve is tricuspid. There is severe calcifcation of the aortic valve. Aortic valve regurgitation is mild. Aortic regurgitation PHT measures 207 msec. Aortic valve area, by VTI measures 1.27 cm. Aortic valve mean gradient measures 9.8 mmHg. Aortic valve Vmax measures 2.11 m/s.  8. The inferior vena cava is dilated in size with <50% respiratory variability, suggesting right atrial pressure of 15 mmHg. FINDINGS  Left Ventricle: Left ventricular ejection fraction, by estimation, is <20%. The left ventricle has severely decreased function. The left ventricle demonstrates global hypokinesis. Definity contrast agent was given IV to delineate the left ventricular endocardial borders. The left ventricular internal cavity size was severely dilated. The interventricular septum is flattened in systole, consistent with right ventricular  pressure overload. Left ventricular diastolic parameters are indeterminate. Right Ventricle: Right ventricular systolic function is severely reduced. There is moderately elevated pulmonary artery systolic pressure. Left Atrium: Left atrial size was severely dilated. Right Atrium: Right atrial size was moderately dilated. Mitral Valve: The mitral valve has been repaired/replaced. There is a mechanical valve present in the mitral position. Echo findings are consistent with normal structure and function of the mitral valve prosthesis. MV peak gradient, 13.6 mmHg. The mean mitral valve gradient is 4.7 mmHg. Tricuspid Valve:  Tricuspid valve regurgitation is mild to moderate. Aortic Valve: Aotic valve is heavily calcified and barely opens. Suspect severe low-flow/low-gradient AS. The aortic valve is tricuspid. There is severe calcifcation of the aortic valve. Aortic valve regurgitation is mild. Aortic regurgitation PHT measures 207 msec. Aortic valve mean gradient measures 9.8 mmHg. Aortic valve peak gradient measures 17.9 mmHg. Aortic valve area, by VTI measures 1.27 cm. Pulmonic Valve: Pulmonic valve regurgitation is trivial. Venous: The inferior vena cava is dilated in size with less than 50% respiratory variability, suggesting right atrial pressure of 15 mmHg. Additional Comments: A device lead is visualized. LEFT VENTRICLE PLAX 2D LVIDd:         6.70 cm      Diastology LVIDs:         6.30 cm      LV e' medial:    3.38 cm/s LV PW:         1.20 cm      LV E/e' medial:  42.6 LV IVS:        1.00 cm      LV e' lateral:   5.98 cm/s LVOT diam:     2.20 cm      LV E/e' lateral: 24.1 LV SV:         39 LV SV Index:   19 LVOT Area:     3.80 cm  LV Volumes (MOD) LV vol d, MOD A2C: 376.0 ml LV vol d, MOD A4C: 350.0 ml LV vol s, MOD A2C: 302.0 ml LV vol s, MOD A4C: 286.0 ml LV SV MOD A2C:     74.0 ml LV SV MOD A4C:     350.0 ml LV SV MOD BP:      74.3 ml RIGHT VENTRICLE            IVC RV S prime:     5.61 cm/s  IVC diam: 2.50 cm  TAPSE (M-mode): 1.0 cm LEFT ATRIUM         Index LA diam:    6.00 cm 2.91 cm/m  AORTIC VALVE AV Area (Vmax):    1.20 cm AV Area (Vmean):   1.15 cm AV Area (VTI):     1.27 cm AV Vmax:           211.25 cm/s AV Vmean:          150.500 cm/s AV VTI:            0.309 m AV Peak Grad:      17.9 mmHg AV Mean Grad:      9.8 mmHg LVOT Vmax:         66.50 cm/s LVOT Vmean:        45.600 cm/s LVOT VTI:          0.103 m LVOT/AV VTI ratio: 0.33 AI PHT:            207 msec  AORTA Ao Root diam: 3.50 cm Ao Asc diam:  3.00 cm MITRAL VALVE                TRICUSPID VALVE MV Area (PHT): 7.09 cm     TR Peak grad:   45.7 mmHg MV Area VTI:   1.31 cm     TR Vmax:        338.00 cm/s MV Peak grad:  13.6 mmHg MV Mean grad:  4.7 mmHg     SHUNTS MV Vmax:       1.84 m/s     Systemic VTI:  0.10  m MV Vmean:      97.6 cm/s    Systemic Diam: 2.20 cm MV Decel Time: 107 msec MV E velocity: 144.00 cm/s Glori Bickers MD Electronically signed by Glori Bickers MD Signature Date/Time: 02/26/2022/3:33:19 PM    Final    DG CHEST PORT 1 VIEW  Result Date: 02/25/2022 CLINICAL DATA:  PICC line placement. EXAM: PORTABLE CHEST 1 VIEW COMPARISON:  One-view chest x-ray 02/22/2022 FINDINGS: The heart is enlarged. Atherosclerotic calcifications are present at the aortic arch. Interval left-sided PICC line placement noted. The tip is at the cavoatrial junction. Single AICD lead is stable. Mild edema and bilateral pleural effusions have increased. Bibasilar airspace opacity likely reflects atelectasis. IMPRESSION: No active disease. Electronically Signed   By: San Morelle M.D.   On: 02/25/2022 12:33    Labs: BMET Recent Labs  Lab 02/22/22 1427 02/23/22 0453 02/25/22 0300 02/26/22 0452 02/27/22 0427  NA 135 137 135 135 135  K 3.3* 3.6 3.8 3.7 3.6  CL 93* 94* 95* 94* 95*  CO2 25 27 21* 24 26  GLUCOSE 231* 181* 149* 173* 149*  BUN 129* 133* 141* 136* 141*  CREATININE 4.04* 4.15* 4.20* 4.32* 4.16*  CALCIUM 9.7 8.7* 8.6* 8.5* 8.5*   PHOS  --  4.5 5.1* 4.6  --    CBC Recent Labs  Lab 02/22/22 1427 02/23/22 0453 02/25/22 0300 02/26/22 0452  WBC 4.6 3.8* 4.2 4.4  NEUTROABS  --  2.7  --   --   HGB 12.6* 12.0* 12.8* 11.9*  HCT 40.1 38.4* 39.8 36.3*  MCV 86.2 87.5 86.3 84.6  PLT 170 148* 164 158    Medications:     Chlorhexidine Gluconate Cloth  6 each Topical Daily   insulin aspart  0-5 Units Subcutaneous QHS   insulin aspart  0-9 Units Subcutaneous TID WC   metolazone  5 mg Oral Daily   midodrine  15 mg Oral TID WC   pantoprazole  40 mg Oral Daily   rosuvastatin  40 mg Oral QODAY   sodium chloride flush  10-40 mL Intracatheter Q12H   warfarin  5 mg Oral ONCE-1600   Warfarin - Pharmacist Dosing Inpatient   Does not apply q1600      Madelon Lips, MD 02/27/2022, 11:41 AM

## 2022-02-27 NOTE — Progress Notes (Signed)
Progress Note   Patient: John [name] QQI:[id number] DOB: 1951-05-24 DOA: 02/22/2022     5 DOS: the patient was seen and examined on 02/27/2022   Brief hospital course: Mr. Bozzi was admitted to the hospital with the working diagnosis of worsening renal function in the setting of heart failure decompensation.   71 yo male with the past medical history of heart failure with reduced systolic function, CAD, mechanical mitral valve replacement, aortic valve stenosis, rheumatic heart disease, coronary artery disease, CKD stage 3b, atrial fibrillation, and T2DM who presented with altered mental status. Patient was noted to be confused for 3 weeks. Because worsening symptoms his wife brought him to the hospital. Per his wife his blood pressure has been 80 to 90 systolic over last 2 weeks.    On his initial physical examination his blood pressure was 97/63, HR 87, RR 18 and 02 saturation 97% on 2 L per [place], lungs with no wheezing, heart with S1 and S2 present and rhythmic, abdomen not distended, lower extremities with wraps in place.   Na 135, K 3,3 Cl 93, bicarbonate 25, glucose 231, bun 129 cr 4,0 BNP 3,004 High sensitive troponin 54  Wbc 4,6 hgb 12,6 plt 170  INR 5,2   Head CT with no acute changes, small to moderate size chronic cortical and subcortical infarcts within the mid right frontal lobe and right insula.  Small chronic cortical and subcortical infarct within the high right parietal lobe. Chronic lacunar infarcts within the left thalamus.   Chest radiograph with cardiomegaly, bilateral hilar vascular congestion, bilateral small pleural effusions.  Pacer defibrillator with one ventricular lead  EKG 104 bpm, right axis deviation, right bundle branch block, qtc prolongation, atrial fibrillation rhythm with no significant ST segment  or T wave changes.   On admission patient with persistent hypotension and not able to diurese.  Paced PICC line and started on milrinone for inotropic  support. Patient with poor prognosis, patient not wanting renal replacement therapy and likely not candidate for HD.   Patient not responding to medical therapy, he has a very poor prognosis in the setting of renal failure and rheumatic heart disease.   06/20 change code status to DNR and palliative care team consulted.     Assessment and Plan: * Acute on chronic systolic CHF (congestive heart failure) (HCC) Echocardiogram with LV EF reduced to less than 20%, global hypokinesis, severe dilatation of internal cavity. RV systolic function with severe reduction. Mild to moderate TR, mechanical mitral valve, severe aortic stenosis (low flow/ low gradient). Moderate elevation of pulmonary artery systolic pressure.    Rheumatic heart disease, sp mitral valve replacement.   Cardiogenic shock/ low output heart failure decompensation.   Urine output over last 24 hrs is 450 ml  Blood pressure systolic 79 to 91 mmHg.   Medical therapy with furosemide drip at 8 mg per hr. Milrinone drip Midodrine 15 mg po tid.  Oral metolazone 5 mg daily.   Very poor prognosis. Code statu changed to DNR.      Acute kidney injury superimposed on chronic kidney disease ([place]) CKD stage 3b  Very poor response to diuretic therapy. Continue with significant volume overload.  Renal function with serum cr at 4,16 with K at 3,6 and serum bicarbonate at 26.   Plan to transition to furosemide drip and continue with oral metolazone. Patient does not want dialysis. Considering heart disease patient will not be able to tolerate hemodialysis.   Chronic atrial fibrillation (HCC) Atrial flutter with  rapid ventricular response. Not candidate for AV blockade due to low output heart failure.   Continue amiodarone for rate control.   Continue anticoagulation with warfarin. INR today is 2.8.  Continue telemetry monitoring.    Gastroesophageal reflux disease - Continue PPI.  HTN (hypertension) Hypotension. Low  output heart failure, continue with midodrine and milrinone.   Rheumatic heart disease Patient sp mechanical mitral valve replacement, on warfarin therapy. INR is therapeutic Aortic stenosis with low gradient.  Not TAVR candidate (old records personally reviewed).   Type 2 diabetes mellitus with hyperlipidemia ([employer]) Hold on basal insulin due to low GFR. Continue with insulin sliding scale for glucose cover and monitoring.  Fasting glucose this am is 149 mg/dl.   Continue with statin therapy.         Subjective: Patient with no chest pain, continue to have dyspnea and lower extremity edema, very weak and deconditioned   Physical Exam: Vitals:   02/27/22 0638 02/27/22 0800 02/27/22 0821 02/27/22 1043  BP: 97/60 (!) 93/54 (!) 95/51 91/62  Pulse:   (!) 120 (!) 104  Resp:   16 17  Temp:    (!) 97.4 F (36.3 C)  TempSrc:    Oral  SpO2:   100% 93%  Weight:      Height:       Neurology awake and alert ENT with mild pallor Cardiovascular with S1 and S2 present, positive mechanical click, positive systolic murmur at the apex. 3/6 with no gallop or rubs Positive JVD Positive lower extremity edema ++ pitting bilaterally Respiratory with scattered rales but no wheezing  Abdomen not distended  Data Reviewed:    Family Communication: I spoke with patient's wife at the bedside, we talked in detail about patient's condition, plan of care and prognosis and all questions were addressed.   Disposition: Status is: Inpatient Remains inpatient appropriate because: heart and renal failure   Planned Discharge Destination: Home    Author: Tawni Millers, MD 02/27/2022 2:45 PM  For on call review [email or web address].

## 2022-02-27 NOTE — Progress Notes (Signed)
RN went to put DNR bracelet on patient based on change in code status today, Patient states he will need to "think about it first".

## 2022-02-28 ENCOUNTER — Other Ambulatory Visit (HOSPITAL_COMMUNITY): Payer: HMO

## 2022-02-28 DIAGNOSIS — N179 Acute kidney failure, unspecified: Secondary | ICD-10-CM | POA: Diagnosis not present

## 2022-02-28 DIAGNOSIS — Z66 Do not resuscitate: Secondary | ICD-10-CM

## 2022-02-28 DIAGNOSIS — E1169 Type 2 diabetes mellitus with other specified complication: Secondary | ICD-10-CM | POA: Diagnosis not present

## 2022-02-28 DIAGNOSIS — Z515 Encounter for palliative care: Secondary | ICD-10-CM | POA: Diagnosis not present

## 2022-02-28 DIAGNOSIS — Z7189 Other specified counseling: Secondary | ICD-10-CM

## 2022-02-28 DIAGNOSIS — I5023 Acute on chronic systolic (congestive) heart failure: Secondary | ICD-10-CM | POA: Diagnosis not present

## 2022-02-28 DIAGNOSIS — I482 Chronic atrial fibrillation, unspecified: Secondary | ICD-10-CM | POA: Diagnosis not present

## 2022-02-28 LAB — BASIC METABOLIC PANEL
Anion gap: 15 (ref 5–15)
BUN: 142 mg/dL — ABNORMAL HIGH (ref 8–23)
CO2: 24 mmol/L (ref 22–32)
Calcium: 8.5 mg/dL — ABNORMAL LOW (ref 8.9–10.3)
Chloride: 93 mmol/L — ABNORMAL LOW (ref 98–111)
Creatinine, Ser: 4.45 mg/dL — ABNORMAL HIGH (ref 0.61–1.24)
GFR, Estimated: 14 mL/min — ABNORMAL LOW (ref >60)
Glucose, Bld: 236 mg/dL — ABNORMAL HIGH (ref 70–99)
Potassium: 4 mmol/L (ref 3.5–5.1)
Sodium: 132 mmol/L — ABNORMAL LOW (ref 135–145)

## 2022-02-28 LAB — GLUCOSE, CAPILLARY
Glucose-Capillary: 334 mg/dL — ABNORMAL HIGH (ref 70–99)
Glucose-Capillary: 319 mg/dL — ABNORMAL HIGH (ref 70–99)
Glucose-Capillary: 246 mg/dL — ABNORMAL HIGH (ref 70–99)

## 2022-02-28 LAB — COOXEMETRY PANEL
Carboxyhemoglobin: 2.4 % — ABNORMAL HIGH (ref 0.5–1.5)
Methemoglobin: <0.7 % (ref 0.0–1.5)
O2 Saturation: 79 %
Total hemoglobin: 10.9 g/dL — ABNORMAL LOW (ref 12.0–16.0)

## 2022-02-28 MED ORDER — WARFARIN SODIUM 2.5 MG PO TABS
2.5000 mg | ORAL_TABLET | Freq: Once | ORAL | Status: AC
Start: 2022-02-28 — End: 2022-02-28
  Administered 2022-02-28: 2.5 mg via ORAL
  Filled 2022-02-28: qty 1

## 2022-02-28 MED ORDER — GLYCOPYRROLATE 0.2 MG/ML IJ SOLN: 0.2000 mg | INTRAMUSCULAR | Status: DC | PRN

## 2022-02-28 MED ORDER — HYDROMORPHONE HCL 1 MG/ML IJ SOLN
0.5000 mg | INTRAMUSCULAR | Status: DC | PRN
  Administered 2022-02-28: 0.5 mg via INTRAVENOUS
  Filled 2022-02-28: qty 0.5

## 2022-02-28 MED ORDER — HALOPERIDOL LACTATE 2 MG/ML PO CONC: 2.0000 mg | ORAL | Status: DC | PRN

## 2022-02-28 MED ORDER — BIOTENE DRY MOUTH MT LIQD
15.0000 mL | Freq: Three times a day (TID) | OROMUCOSAL | Status: DC
Start: 2022-02-28 — End: 2022-03-03
  Administered 2022-03-01 – 2022-03-02 (×2): 15 mL via TOPICAL

## 2022-02-28 MED ORDER — LORAZEPAM 2 MG/ML PO CONC: 1.0000 mg | ORAL | Status: DC | PRN

## 2022-02-28 MED ORDER — HALOPERIDOL LACTATE 5 MG/ML IJ SOLN: 2.0000 mg | INTRAMUSCULAR | Status: DC | PRN

## 2022-02-28 MED ORDER — INSULIN ASPART 100 UNIT/ML IJ SOLN
0.0000 [IU] | Freq: Every day | INTRAMUSCULAR | Status: DC
  Administered 2022-02-28: 2 [IU] via SUBCUTANEOUS

## 2022-02-28 MED ORDER — LORAZEPAM 2 MG/ML IJ SOLN: 1.0000 mg | INTRAMUSCULAR | Status: DC | PRN

## 2022-02-28 MED ORDER — HALOPERIDOL 1 MG PO TABS: 2.0000 mg | ORAL_TABLET | ORAL | Status: DC | PRN

## 2022-02-28 MED ORDER — HYDROMORPHONE HCL 1 MG/ML IJ SOLN
0.5000 mg | INTRAMUSCULAR | Status: AC
  Administered 2022-02-28: 0.5 mg via INTRAVENOUS
  Filled 2022-02-28: qty 0.5

## 2022-02-28 MED ORDER — INSULIN ASPART 100 UNIT/ML IJ SOLN
0.0000 [IU] | Freq: Three times a day (TID) | INTRAMUSCULAR | Status: DC
  Administered 2022-02-28: 7 [IU] via SUBCUTANEOUS
  Administered 2022-03-01: 1 [IU] via SUBCUTANEOUS
  Administered 2022-03-01: 2 [IU] via SUBCUTANEOUS

## 2022-02-28 MED ORDER — GLYCOPYRROLATE 1 MG PO TABS: 1.0000 mg | ORAL_TABLET | ORAL | Status: DC | PRN

## 2022-02-28 MED ORDER — LORAZEPAM 1 MG PO TABS
1.0000 mg | ORAL_TABLET | ORAL | Status: DC | PRN
  Administered 2022-03-01: 1 mg via ORAL
  Filled 2022-02-28: qty 1

## 2022-02-28 MED ORDER — INSULIN GLARGINE-YFGN 100 UNIT/ML ~~LOC~~ SOLN
5.0000 [IU] | Freq: Every day | SUBCUTANEOUS | Status: DC
  Administered 2022-02-28: 5 [IU] via SUBCUTANEOUS
  Filled 2022-02-28 (×2): qty 0.05

## 2022-02-28 MED ORDER — POLYVINYL ALCOHOL 1.4 % OP SOLN: 1.0000 [drp] | Freq: Four times a day (QID) | OPHTHALMIC | Status: DC | PRN

## 2022-02-28 NOTE — Progress Notes (Signed)
Progress Note  Patient Name: John [name] Date of Encounter: 02/28/2022  [facility] Cardiologist: Minus Breeding, MD   Subjective   Weak.  Complaining of neuropathic pain.   Inpatient Medications    Scheduled Meds:  Chlorhexidine Gluconate Cloth  6 each Topical Daily   metolazone  5 mg Oral Daily   midodrine  15 mg Oral TID WC   pantoprazole  40 mg Oral Daily   rosuvastatin  40 mg Oral QODAY   sodium chloride flush  10-40 mL Intracatheter Q12H   warfarin  2.5 mg Oral ONCE-1600   Warfarin - Pharmacist Dosing Inpatient   Does not apply q1600   Continuous Infusions:  amiodarone 30 mg/hr (02/28/22 0646)   furosemide (LASIX) 200 mg in dextrose 5 % 100 mL (2 mg/mL) infusion 8 mg/hr (02/28/22 0646)   milrinone 0.375 mcg/kg/min (02/28/22 0646)   PRN Meds: acetaminophen, sodium chloride flush   Vital Signs    Vitals:   02/28/22 0020 02/28/22 0225 02/28/22 0400 02/28/22 0621  BP:   95/60 (!) 93/49  Pulse:   (!) 109   Resp: 20  (!) 22   Temp:   97.8 F (36.6 C)   TempSrc:   Oral   SpO2: 97%  95%   Weight:  87.9 kg    Height:        Intake/Output Summary (Last 24 hours) at 02/28/2022 1040 Last data filed at 02/28/2022 0646 Gross per 24 hour  Intake 1231.89 ml  Output 300 ml  Net 931.89 ml      02/28/2022    2:25 AM 02/27/2022    4:09 AM 02/25/2022    4:58 AM  Last 3 Weights  Weight (lbs) 193 lb 12.8 oz 192 lb 6.4 oz 189 lb  Weight (kg) 87.907 kg 87.272 kg 85.73 kg      Telemetry    Atrial fib with controlled ventricular rate  - Personally Reviewed  ECG    NA  - Personally Reviewed  Physical Exam   GEN: No  acute distress.   Chronically ill appearing.  Neck:   Positive JVD to jaw.  Cardiac: Irregular RR, systolic apical murmurs, rubs, or gallops.  Respiratory: Clear   to auscultation bilaterally. GI: Soft, nontender, non-distended, normal bowel sounds  MS:  Diffuse edema; No deformity. Neuro:   Nonfocal  Psych: Oriented and appropriate     Labs    High Sensitivity Troponin:   Recent Labs  Lab 02/22/22 1427 02/22/22 1633  TROPONINIHS 54* 61*        Latest Ref Rng & Units 12/02/2021    5:15 AM 02/25/2022   12:40 PM 02/26/2022    4:52 AM 02/27/2022    4:15 AM 02/28/2022    4:00 AM  Total Hgb  Total Hgb 12.0 - 16.0 g/dL 11.4  12.6  11.8  11.4  10.9       Latest Ref Rng & Units 12/02/2021    5:15 AM 02/25/2022   12:40 PM 02/26/2022    4:52 AM 02/27/2022    4:15 AM 02/28/2022    4:00 AM  O2 Sat  O2 Sat % 59.3  52.2  65.6  63.4  79       Latest Ref Rng & Units 12/02/2021    5:15 AM 02/25/2022   12:40 PM 02/26/2022    4:52 AM 02/27/2022    4:15 AM 02/28/2022    4:00 AM  Carboxy Hgb  Carboxy Hgb 0.5 - 1.5 % 1.2  1.1  1.3  1.8  2.4       Latest Ref Rng & Units 12/02/2021    5:15 AM 02/25/2022   12:40 PM 02/26/2022    4:52 AM 02/27/2022    4:15 AM 02/28/2022    4:00 AM  Met Hgb  Met Hgb 0.0 - 1.5 % <0.7  <0.7  <0.7  <0.7  <0.7       12/01/2021   11:35 AM 12/01/2021    4:00 PM 12/02/2021   12:00 AM 12/02/2021    3:46 AM 12/02/2021    8:00 AM  CVP  CVP (mmHg) 7 mmHg 7 mmHg 6 mmHg 7 mmHg 6 mmHg    Chemistry Recent Labs  Lab 02/22/22 1427 02/22/22 1633 02/23/22 0453 02/25/22 0300 02/26/22 0452 02/27/22 0427 02/28/22 0437  NA 135  --  137 135 135 135 132*  K 3.3*  --  3.6 3.8 3.7 3.6 4.0  CL 93*  --  94* 95* 94* 95* 93*  CO2 25  --  27 21* $Remo'24 26 24  'bzAqq$ GLUCOSE 231*  --  181* 149* 173* 149* 236*  BUN 129*  --  133* 141* 136* 141* 142*  CREATININE 4.04*  --  4.15* 4.20* 4.32* 4.16* 4.45*  CALCIUM 9.7  --  8.7* 8.6* 8.5* 8.5* 8.5*  MG  --  2.5* 2.6*  --   --   --   --   PROT 6.6  --  6.0*  --   --   --   --   ALBUMIN 3.4*  --  2.7* 2.8* 2.6*  --   --   AST 27  --  25  --   --   --   --   ALT 13  --  16  --   --   --   --   ALKPHOS 323*  --  304*  --   --   --   --   BILITOT 2.1*  --  2.0*  --   --   --   --   GFRNONAA 15*  --  15* 15* 14* 15* 14*  ANIONGAP 17*  --  16* 19* 17* 14 15    Lipids No results  for input(s): "CHOL", "TRIG", "HDL", "LABVLDL", "LDLCALC", "CHOLHDL" in the last 168 hours.  Hematology Recent Labs  Lab 02/23/22 0453 02/25/22 0300 02/26/22 0452  WBC 3.8* 4.2 4.4  RBC 4.39 4.61 4.29  HGB 12.0* 12.8* 11.9*  HCT 38.4* 39.8 36.3*  MCV 87.5 86.3 84.6  MCH 27.3 27.8 27.7  MCHC 31.3 32.2 32.8  RDW 17.4* 17.6* 17.6*  PLT 148* 164 158   Thyroid No results for input(s): "TSH", "FREET4" in the last 168 hours.  BNP Recent Labs  Lab 02/22/22 1426  BNP 3,004.6*    DDimer No results for input(s): "DDIMER" in the last 168 hours.   Lab Results  Component Value Date   INR 2.8 (H) 02/27/2022   INR 2.7 (H) 02/26/2022   INR 3.4 (H) 02/25/2022     Radiology    No results found.  Cardiac Studies    ECHO:  02/26/2022  1. Left ventricular ejection fraction, by estimation, is <20%. The left  ventricle has severely decreased function. The left ventricle demonstrates  global hypokinesis. The left ventricular internal cavity size was severely  dilated. Left ventricular diastolic parameters are indeterminate. There is the interventricular septum is flattened in systole, consistent with right ventricular pressure overload.   2. Right ventricular systolic function is  severely reduced. There is  moderately elevated pulmonary artery systolic pressure.   3. Left atrial size was severely dilated.   4. Right atrial size was moderately dilated.   5. The mitral valve has been repaired/replaced. The mean mitral valve  gradient is 4.7 mmHg. There is a mechanical valve present in the mitral  position. Echo findings are consistent with normal structure and function  of the mitral valve prosthesis.   6. Tricuspid valve regurgitation is mild to moderate.   7. Aotic valve is heavily calcified and barely opens. Suspect severe  low-flow/low-gradient AS. The aortic valve is tricuspid. There is severe calcifcation of the aortic valve. Aortic valve regurgitation is mild. Aortic regurgitation  PHT measures 207 msec. Aortic valve area, by VTI measures 1.27 cm. Aortic valve mean gradient measures 9.8 mmHg. Aortic valve Vmax measures 2.11 m/s.   8. The inferior vena cava is dilated in size with <50% respiratory  variability, suggesting right atrial pressure of 15 mmHg.   2D echo 11/17/21  1. Left ventricular ejection fraction, by estimation, is 20 to 25%. The  left ventricle has severely decreased function. The left ventricle  demonstrates global hypokinesis. The left ventricular internal cavity size  was moderately dilated. Left  ventricular diastolic parameters are indeterminate. No LV thrombus.   2. Right ventricular systolic function is severely reduced. The right  ventricular size is normal. There is severely elevated pulmonary artery  systolic pressure. The estimated right ventricular systolic pressure is  65.7 mmHg.   3. Left atrial size was severely dilated.   4. Right atrial size was mildly dilated.   5. Moderate pleural effusion in the left lateral region.   6. The mitral valve has been replaced by a mechanical bileafelt valve. No  evidence of mitral valve regurgitation, no PVL.   7. Tricuspid valve regurgitation is moderate to severe. There is systolic  flow reversal of the hepatic vein.   8. The aortic valve is calcified. Aortic valve regurgitation is moderate.  Moderate aortic valve stenosis. Aortic valve mean gradient measures 15.0  mmHg. There is a component of low flow low gradient aortic stenosis.   9. The inferior vena cava is dilated in size with >50% respiratory  variability, suggesting right atrial pressure of 8 mmHg.   Patient Profile     71 y.o. 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    Assessment & Plan    Acute on chronic CHF:  Intake and output incomplete.   Na now falling.  Low output failure without response to increased inotrope or IV Lasix.  I discussed palliative care and I think that this is appropriate.  I will consult.  Continue current therapy for today.   Low-flow low gradient aortic stenosis:   Not an interventional candidate as decided on previous admission.    CKD V:   Progressive renal failure not a dialysis candidate.  He has previously said he would not want this.   Atrial fib RVR:  Holding beta blocker secondary to low BPs.  Rate is OK.  ICD:    As the goals of care discussion progresses we can discuss plans with the  ICD.    For questions or updates, please contact [facility] Please consult [email or web address] for contact info under      Signed, Minus Breeding, MD  02/28/2022, 10:40 AM

## 2022-02-28 NOTE — Progress Notes (Signed)
  Progress Note   Patient: John [name] VFI:[id number] DOB: 1951/01/11 DOA: 02/22/2022     6 DOS: the patient was seen and examined on 02/28/2022   Brief hospital course: 71 year old man presenting with confusion, hypotension. Treated for acute on chronic CHF, low output with failure to respond to inotrope and Lasix.  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  Given failure to improve, cardiology nephrology recommended palliative intervention and patient has elected to move towards comfort care.  Assessment and Plan: * Acute on chronic systolic CHF (congestive heart failure) (HCC) --echo LVEF <20%; rheumatic heart disease, sp mitral valve replacement, cardiogenic shock/ low output heart failure decompensation; aortic stenosis with low gradient. Not TAVR candidate  --for now will continue medical therapy with furosemide and milrinone infusions per cardiology  AKI superimposed on CKD stage 3b. AKI secondary to cardiorenal syndrome. --failed to respond to furosemide infusion --nephrology signed off; moving towards comforat care.  Chronic atrial fibrillation (HCC) --Atrial flutter with rapid ventricular response; not candidate for AV blockade due to low output heart failure.  --Continue amiodarone for rate control.  --Continue anticoagulation with warfarin. INR today is 2.8.   Essential HTN   --hypotension secondary to low output heart failure, continue with midodrine and milrinone.   Rheumatic heart disease --S/p mechanical mitral valve replacement, on warfarin therapy. --INR therapeutic  Type 2 diabetes mellitus with hyperlipidemia ([place]) --SSI; will start low does basal      Subjective:  Feels ok Poor appetite Ate a little yogurt today Drinking milk Breathing ok Chronic pain in feet  Physical Exam: Vitals:   02/28/22 0621 02/28/22 0839 02/28/22 1200  02/28/22 1600  BP: (!) 93/49 (!) 86/57 (!) 81/48 (!) 82/47  Pulse:   (!) 116 (!) 110  Resp:   16   Temp:   (!) 97.3 F (36.3 C) 98.1 F (36.7 C)  TempSrc:    Oral  SpO2:   96% 94%  Weight:      Height:       Physical Exam Vitals reviewed.  Constitutional:      General: He is not in acute distress.    Appearance: He is not ill-appearing or toxic-appearing.  Cardiovascular:     Rate and Rhythm: Normal rate and regular rhythm.     Heart sounds: No murmur heard. Pulmonary:     Effort: No respiratory distress.     Breath sounds: No wheezing, rhonchi or rales.  Neurological:     Mental Status: He is alert.  Psychiatric:        Mood and Affect: Mood normal.        Behavior: Behavior normal.     Data Reviewed:  UOP incompletely recorded CBG high 100-300s Creatinine up to 4.45  Family Communication: wife at bedside  Disposition: Status is: Inpatient Remains inpatient appropriate because: severe low output CHF, worsening AKI  Planned Discharge Destination:  TBD    Time spent: 35 minutes  Author: Murray Hodgkins, MD 02/28/2022 7:15 PM  For on call review [email or web address].

## 2022-02-28 NOTE — Consult Note (Cosign Needed)
Consultation Note Date: 02/28/2022   Patient Name: John [name]  DOB: [birth year]  MRN: [id number]  Age / Sex: 71 y.o., male  PCP: Claretta Fraise, MD Referring Physician: Samuella Cota, MD  Reason for Consultation: Establishing goals of care  HPI/Patient Profile: 71 y.o. male  with past medical history of heart failure with reduced systolic function, CAD, mechanical mitral valve replacement, aortic valve stenosis, rheumatic heart disease, coronary artery disease, CKD stage 3b, atrial fibrillation, and T2DM  admitted on 02/22/2022 with AMS.  Patient found to have acute on chronic systolic CHF with a EF less than 20%.  Patient diagnosed with cardiogenic shock.  Also with acute kidney injury and very poor response to diuretic therapy.  Patient has been clear he does not want dialysis and also unlikely he would tolerate.  PMT consulted to discuss goals of care.  Clinical Assessment and Goals of Care: I have reviewed medical records including EPIC notes, labs and imaging,  assessed the patient and then met with patient and his wife at bedside to discuss diagnosis prognosis, GOC, EOL wishes, disposition and options.  I introduced Palliative Medicine as specialized medical care for people living with serious illness. It focuses on providing relief from the symptoms and stress of a serious illness. The goal is to improve quality of life for both the patient and the family.  Patient's wife tells me they have been married for 42 years.  They have one 76 year old son.  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  She tells me about complications with his employment due to multiple health concerns.  She tells me about the support they are receiving from their church.  Patient's wife tells me about his recent decline.  She tells me things at home have been bad for 3 weeks with more confusion and weakness.   We discussed patient's  current illness and what it means in the larger context of patient's on-going co-morbidities.  Natural disease trajectory and expectations at EOL were discussed.  We discussed his heart and renal failure.  We discussed he has body is not responding to therapies.  Discussed concern of poor prognosis.  I attempted to elicit values and goals of care important to the patient.  Wife is clear that quality of life is important to patient and he has declined aggressive medical care such as hemodialysis.  She shares they all agree transition to comfort is most in line with his goals of care.  The difference between aggressive medical intervention and comfort care was considered in light of the patient's goals of care.   We discussed the plan to deactivate ICD today and also start as needed medications to promote comfort.  We discussed to shift to focus more on his comfort instead of vital signs/labs.  We discussed pain medications will lower his blood pressure but we will give meds as his symptoms warrant.  She agrees to this as well.  We discussed giving them time tonight to visit with her son and explained situation to other family members and likely tomorrow proceed with stopping milrinone and Lasix infusions.  We discussed seeing how patient does and can further discuss if a transition to hospice facility is appropriate.  Patient present during conversation and reports he is listening but does not speak much.  I checked in with him multiple times throughout conversation and he expressed agreement with the conversation, denied questions or concerns.  The end of our conversation patient reported severe  pain in bilateral lower extremities, requesting pain medication.  Discussed with RN.  [facility] Jude and requested deactivation of ICD.  Questions and concerns were addressed. The family was encouraged to call with questions or concerns.  Primary Decision Maker PATIENT?  Patient minimally interactive  during conversation, unsure of his capacity to make decisions that he did express agreement with wife as she made decisions    SUMMARY OF RECOMMENDATIONS   Transition towards comfort approach: Plan to deactivate ICD today and initiate as needed medications to promote comfort Plan to discontinue continuous infusions including Lasix and milrinone 6/22 We discussed we can further discuss how to proceed once we see how patient does off of milrinone such as considering hospice facility  Code Status/Advance Care Planning: DNR     Primary Diagnoses: Present on Admission:  Acute kidney injury superimposed on chronic kidney disease ([place])  HTN (hypertension)  Chronic atrial fibrillation ([place])  Acute on chronic systolic CHF (congestive heart failure) ([place])  Type 2 diabetes mellitus with hyperlipidemia ([place])  Gastroesophageal reflux disease  Rheumatic heart disease   I have reviewed the medical record, interviewed the patient and family, and examined the patient. The following aspects are pertinent.  Past Medical History:  Diagnosis Date   Allergy    Anemia    "lost blood w/the cancer"   Aortic stenosis    Arthritis    back    CAD (coronary artery disease)    a. s/p CABG 11/2000 (L-LAD, S-RCA);  b. DES to Cx 06/2020.   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 s/p mechanical (St. Jude) MVR 2002;  b. Echo 5/11: Mild LVH, EF 45-50%, mild AS, mild AI, mean gradient 11 mmHg, MVR with normal gradients, moderate LAE, mild RAE;  c.  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   Transaminitis    Type II diabetes mellitus ([place])    Social History   Socioeconomic History   Marital status: Married    Spouse name: Marcie Bal   Number of children: 1   Years of education: 12   Highest education level: High school graduate  Occupational History   Occupation: disabled  Tobacco Use   Smoking status: Former    Types: Cigars   Smokeless tobacco: Never  Vaping Use   Vaping Use: Never used  Substance and Sexual Activity   Alcohol use: No    Comment: "drank for 30 years; quit ~  2000"   Drug use: No    Types: Marijuana    Comment: quit in 2000 'reefer'   Sexual activity: Yes  Other Topics Concern   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   Social Determinants of Health   Financial Resource Strain: Low Risk  (06/12/2019)   Overall Financial Resource Strain (CARDIA)    Difficulty of Paying Living Expenses: Not hard at all  Food Insecurity: No Food Insecurity (06/12/2019)   Hunger Vital Sign    Worried About Running Out of Food in the Last Year: Never true    Ran Out of Food in the Last Year: Never true  Transportation Needs: No Transportation Needs (06/12/2019)   PRAPARE - Hydrologist (Medical): No    Lack of Transportation (Non-Medical): No  Physical Activity: Sufficiently Active (06/12/2019)   Exercise Vital Sign    Days of Exercise per Week: 3 days    Minutes of Exercise per Session: 120 min  Stress: No Stress Concern Present (06/12/2019)   [place]    Feeling of Stress : Not at all  Social Connections: Moderately Isolated (06/12/2019)   Social Connection and Isolation Panel [NHANES]    Frequency of Communication with Friends and Family: More than three times a week    Frequency of Social Gatherings with Friends and Family: More than three times a week    Attends Religious Services: Never    Marine scientist or Organizations: No    Attends Music therapist: Never    Marital Status: Married   Family History  Problem Relation Age of Onset   Diabetes Mother    Hypertension Father    Thyroid disease Sister    Diabetes Brother    Arthritis Brother    Depression Brother    Heart disease Brother    Hyperlipidemia Brother    Hypertension Brother    Hypertension Brother    Diabetes Brother    Heart disease Brother    Hyperlipidemia Brother    Scheduled Meds:  antiseptic oral rinse  15 mL Topical TID   insulin aspart  0-5 Units Subcutaneous QHS   insulin aspart  0-9 Units Subcutaneous TID WC   metolazone  5 mg Oral Daily   midodrine  15 mg Oral TID WC   pantoprazole  40 mg Oral Daily   sodium chloride flush  10-40 mL Intracatheter Q12H   Warfarin - Pharmacist Dosing Inpatient   Does not apply q1600   Continuous Infusions:  amiodarone 30 mg/hr (02/28/22 1239)   furosemide (LASIX) 200 mg in dextrose 5 % 100 mL (2 mg/mL) infusion 8 mg/hr (02/28/22 1051)   milrinone 0.375 mcg/kg/min (02/28/22 1442)   PRN Meds:.acetaminophen, glycopyrrolate **OR** glycopyrrolate **OR** glycopyrrolate, haloperidol **OR** haloperidol **OR** haloperidol lactate, HYDROmorphone (DILAUDID) injection, LORazepam **OR** LORazepam **OR** LORazepam, polyvinyl alcohol, sodium chloride flush Allergies  Allergen Reactions   Allopurinol Shortness Of Breath   Doxycycline Hives   Farxiga [Dapagliflozin]     Can not tolerate - dizzy weak almost passed out   Review of Systems   Unable to perform ROS: Acuity of condition    Physical Exam Constitutional:      General: He is not in acute distress.    Appearance: He is ill-appearing.     Comments: Eyes remain closed during conversation but patient would respond with short answers when spoken to  Pulmonary:  Effort: Pulmonary effort is normal.  Skin:    General: Skin is warm and dry.     Vital Signs: BP (!) 82/47   Pulse (!) 110   Temp 98.1 F (36.7 C) (Oral)   Resp 16   Ht $R'5\' 11"'pi$  (1.803 m)   Wt 87.9 kg   SpO2 94%   BMI 27.03 kg/m  Pain Scale: 0-10   Pain Score: 8    SpO2: SpO2: 94 % O2 Device:SpO2: 94 % O2 Flow Rate: .O2 Flow Rate (L/min): 2 L/min  IO: Intake/output summary:  Intake/Output Summary (Last 24 hours) at 02/28/2022 1906 Last data filed at 02/28/2022 1500 Gross per 24 hour  Intake 869.3 ml  Output 200 ml  Net 669.3 ml     LBM: Last BM Date : 02/27/22 Baseline Weight: Weight: 84.6 kg Most recent weight: Weight: 87.9 kg     Palliative Assessment/Data: PPS 40%     *Please note that this is a verbal dictation therefore any spelling or grammatical errors are due to the "[employer]" system interpretation.   Juel Burrow, DNP, AGNP-C Palliative Medicine Team [phone] Pager: [phone]

## 2022-02-28 NOTE — Progress Notes (Signed)
Insulin coverage and CBG's were discontinued yesterday. This morning serum glucose- 236. Please advise as needed. Thanks

## 2022-02-28 NOTE — Progress Notes (Signed)
Occupational Therapy Treatment Patient Details Name: John [name] MRN: [id number] DOB: 1950/11/13 Today's Date: 02/28/2022   History of present illness The pt is a 71 yo male with increased confusion. 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 Chest: b/l small pleural effusion. PMH includes: arthritis, afib on Coumadin, CKD III, CAD s/p CABG 2002, CHF, GERD, HLD, HTN, Rheumatic heart disease, MVR, and DM II.   OT comments  Patient received in supine and agreeable to OT session. BP in supine 88/50 and 88/54 once on EOB. Patient required increased time before ambulating to bathroom. Patient was min guard to lower to toilet and performed toilet hygiene standing. Patient performed hand and oral hygiene before returning to EOB with BP 76/47. Patient returned to supine due to fatigue and BP was 87/59. Patient to continue to be followed by acute OT.    Recommendations for follow up therapy are one component of a multi-disciplinary discharge planning process, led by the attending physician.  Recommendations may be updated based on patient status, additional functional criteria and insurance authorization.    Follow Up Recommendations  No OT follow up    Assistance Recommended at Discharge Set up Supervision/Assistance  Patient can return home with the following  A little help with bathing/dressing/bathroom   Equipment Recommendations  BSC/3in1    Recommendations for Other Services      Precautions / Restrictions Precautions Precautions: Fall Restrictions Weight Bearing Restrictions: No       Mobility Bed Mobility Overal bed mobility: Needs Assistance Bed Mobility: Supine to Sit, Sit to Supine     Supine to sit: Supervision Sit to supine: Min assist   General bed mobility comments: min assist to return to supine due to fatigue    Transfers Overall transfer level: Needs assistance Equipment used: Rolling walker  (2 wheels) Transfers: Sit to/from Stand Sit to Stand: Supervision           General transfer comment: no physical assist required     Balance Overall balance assessment: Mild deficits observed, not formally tested                                         ADL either performed or assessed with clinical judgement   ADL Overall ADL's : Needs assistance/impaired     Grooming: Wash/dry hands;Wash/dry face;Oral care;Standing;Supervision/safety Grooming Details (indicate cue type and reason): supervision for safety due to BP issues                 Toilet Transfer: Loss adjuster, chartered Details (indicate cue type and reason): min guard to lower toilet Toileting- Clothing Manipulation and Hygiene: Supervision/safety;Sit to/from stand Toileting - Clothing Manipulation Details (indicate cue type and reason): performed toilet hygiene standing       General ADL Comments: supervision for tasks due to low BP    Extremity/Trunk Assessment              Vision       Perception     Praxis      Cognition Arousal/Alertness: Awake/alert Behavior During Therapy: WFL for tasks assessed/performed, Flat affect Overall Cognitive Status: Within Functional Limits for tasks assessed  Exercises      Shoulder Instructions       General Comments BP supine 88/50, EOB 88/54, 76/54 EOB after performing toileting and grooming standing at sink, 87/59 back in supine    Pertinent Vitals/ Pain       Pain Assessment Pain Assessment: No/denies pain  Home Living                                          Prior Functioning/Environment              Frequency  Min 2X/week        Progress Toward Goals  OT Goals(current goals can now be found in the care plan section)  Progress towards OT goals: Progressing toward goals  Acute Rehab OT Goals Patient Stated Goal:  get better OT Goal Formulation: With patient Time For Goal Achievement: 03/09/22 Potential to Achieve Goals: Good ADL Goals Pt Will Perform Lower Body Dressing: with min assist;sit to/from stand Pt Will Perform Toileting - Clothing Manipulation and hygiene: sit to/from stand;with min guard assist Additional ADL Goal #1: Pt will state 3 energy conservation strategies in order to increase independence at home.  Plan Discharge plan remains appropriate    Co-evaluation                 AM-PAC OT "6 Clicks" Daily Activity     Outcome Measure   Help from another person eating meals?: None Help from another person taking care of personal grooming?: None Help from another person toileting, which includes using toliet, bedpan, or urinal?: A Little Help from another person bathing (including washing, rinsing, drying)?: A Little Help from another person to put on and taking off regular upper body clothing?: None Help from another person to put on and taking off regular lower body clothing?: A Little 6 Click Score: 21    End of Session Equipment Utilized During Treatment: Rolling walker (2 wheels)  OT Visit Diagnosis: Unsteadiness on feet (R26.81);Muscle weakness (generalized) (M62.81)   Activity Tolerance Patient tolerated treatment well;Patient limited by fatigue   Patient Left in bed;with call bell/phone within reach;with bed alarm set;with family/visitor present   Nurse Communication Mobility status;Other (comment) (BP readings)        Time: 4332-9518 OT Time Calculation (min): 28 min  Charges: OT General Charges $OT Visit: 1 Visit OT Treatments $Self Care/Home Management : 23-37 mins  Lodema Hong, [place]  Office [place] 02/28/2022, 12:22 PM

## 2022-02-28 NOTE — Consult Note (Cosign Needed)
Consultation Note Date: 02/28/2022   Patient Name: John [name]  DOB: 04-Oct-1950  MRN: [id number]  Age / Sex: 71 y.o., male  PCP: Claretta Fraise, MD Referring Physician: Samuella Cota, MD  Reason for Consultation: Establishing goals of care  HPI/Patient Profile: 71 y.o. male  with past medical history of heart failure with reduced systolic function, CAD, mechanical mitral valve replacement, aortic valve stenosis, rheumatic heart disease, coronary artery disease, CKD stage 3b, atrial fibrillation, and T2DM  admitted on 02/22/2022 with AMS.  Patient found to have acute on chronic systolic CHF with a EF less than 20%.  Patient diagnosed with cardiogenic shock.  Also with acute kidney injury and very poor response to diuretic therapy.  Patient has been clear he does not want dialysis and also unlikely he would tolerate.  PMT consulted to discuss goals of care.  Clinical Assessment and Goals of Care: I have reviewed medical records including EPIC notes, labs and imaging,  assessed the patient and then met with patient and his wife at bedside to discuss diagnosis prognosis, GOC, EOL wishes, disposition and options.  I introduced Palliative Medicine as specialized medical care for people living with serious illness. It focuses on providing relief from the symptoms and stress of a serious illness. The goal is to improve quality of life for both the patient and the family.  Patient's wife tells me about his recent decline.  She tells me things at home have been bad for 3 weeks with more confusion and weakness.   We discussed patient's current illness and what it means in the larger context of patient's on-going co-morbidities.  Natural disease trajectory and expectations at EOL were discussed.  We discussed his heart and renal failure.  We discussed he has body is not responding to therapies.  Discussed concern of poor prognosis.  I attempted to elicit  values and goals of care important to the patient.  Wife is clear that quality of life is important to patient and he has declined aggressive medical care such as hemodialysis.  She shares they all agree transition to comfort is most in line with his goals of care.  The difference between aggressive medical intervention and comfort care was considered in light of the patient's goals of care.   We discussed the plan to deactivate ICD today and also start as needed medications to promote comfort.  We discussed to shift to focus more on his comfort instead of vital signs/labs.  We discussed pain medications will lower his blood pressure but we will give meds as his symptoms warrant.  She agrees to this as well.  We discussed giving them time tonight to visit with her son and explained situation to other family members and likely tomorrow proceed with stopping milrinone and Lasix infusions.  We discussed seeing how patient does and can further discuss if a transition to hospice facility is appropriate.  Patient present during conversation and reports he is listening but does not speak much.  I checked in with him multiple times throughout conversation and he expressed agreement with the conversation, denied questions or concerns.  The end of our conversation patient reported severe pain in bilateral lower extremities, requesting pain medication.  Discussed with RN.  [facility] and requested deactivation of ICD.  Questions and concerns were addressed. The family was encouraged to call with questions or concerns.  Primary Decision Maker PATIENT?  Patient minimally interactive during conversation, unsure of his capacity to make decisions that he did  express agreement with wife as she made decisions    SUMMARY OF RECOMMENDATIONS   Transition towards comfort approach: Plan to deactivate ICD today and initiate as needed medications to promote comfort Plan to discontinue continuous infusions  including Lasix and milrinone 6/22 We discussed we can further discuss how to proceed once we see how patient does off of milrinone such as considering hospice facility  Code Status/Advance Care Planning: DNR     Primary Diagnoses: Present on Admission:  Acute kidney injury superimposed on chronic kidney disease ([place])  HTN (hypertension)  Chronic atrial fibrillation ([place])  Acute on chronic systolic CHF (congestive heart failure) ([place])  Type 2 diabetes mellitus with hyperlipidemia ([place])  Gastroesophageal reflux disease  Rheumatic heart disease   I have reviewed the medical record, interviewed the patient and family, and examined the patient. The following aspects are pertinent.  Past Medical History:  Diagnosis Date   Allergy    Anemia    "lost blood w/the cancer"   Aortic stenosis    Arthritis    back    CAD (coronary artery disease)    a. s/p CABG 11/2000 (L-LAD, S-RCA);  b. DES to Cx 06/2020.   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 s/p mechanical ([facility]) MVR 2002;  b. Echo 5/11: Mild LVH, EF 45-50%, mild AS, mild AI, mean gradient 11 mmHg, MVR with normal gradients, moderate LAE, mild RAE;  c.  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   Transaminitis    Type II diabetes mellitus ([place])    Social History   Socioeconomic History   Marital status: Married    Spouse name: Marcie Bal   Number of children: 1   Years of education: 12   Highest education level: High school graduate  Occupational History   Occupation: disabled  Tobacco Use   Smoking status: Former    Types: Cigars   Smokeless tobacco: Never  Vaping Use   Vaping Use: Never used  Substance and Sexual Activity   Alcohol use: No    Comment: "drank for 30 years; quit ~ 2000"   Drug use: No    Types: Marijuana    Comment: quit in 2000 'reefer'   Sexual activity: Yes  Other Topics Concern   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   Social Determinants of Health   Financial Resource Strain: Low  Risk  (06/12/2019)   Overall Financial Resource Strain (CARDIA)    Difficulty of Paying Living Expenses: Not hard at all  Food Insecurity: No Food Insecurity (06/12/2019)   Hunger Vital Sign    Worried About Running Out of Food in the Last Year: Never true    Ran Out of Food in the Last Year: Never true  Transportation Needs: No Transportation Needs (06/12/2019)   PRAPARE - Hydrologist (Medical): No    Lack of Transportation (Non-Medical): No  Physical Activity: Sufficiently Active (06/12/2019)   Exercise Vital Sign    Days of Exercise per Week: 3 days    Minutes of Exercise per Session: 120 min  Stress: No Stress Concern Present (06/12/2019)   [place]    Feeling of Stress : Not at all  Social Connections: Moderately Isolated (06/12/2019)   Social Connection and Isolation Panel [NHANES]    Frequency of Communication with Friends and Family: More than three times a week    Frequency of Social  Gatherings with Friends and Family: More than three times a week    Attends Religious Services: Never    Marine scientist or Organizations: No    Attends Music therapist: Never    Marital Status: Married   Family History  Problem Relation Age of Onset   Diabetes Mother    Hypertension Father    Thyroid disease Sister    Diabetes Brother    Arthritis Brother    Depression Brother    Heart disease Brother    Hyperlipidemia Brother    Hypertension Brother    Hypertension Brother    Diabetes Brother    Heart disease Brother    Hyperlipidemia Brother    Scheduled Meds:  antiseptic oral rinse  15 mL Topical TID   insulin aspart  0-5 Units Subcutaneous QHS   insulin aspart  0-9 Units Subcutaneous TID WC   metolazone  5 mg Oral Daily   midodrine  15 mg Oral TID WC   pantoprazole  40 mg Oral Daily   sodium chloride flush  10-40 mL Intracatheter Q12H   warfarin  2.5 mg Oral ONCE-1600   Warfarin - Pharmacist Dosing Inpatient   Does not apply q1600   Continuous Infusions:  amiodarone 30 mg/hr (02/28/22 1239)   furosemide (LASIX) 200 mg in dextrose 5 % 100 mL (2 mg/mL) infusion 8 mg/hr (02/28/22 1051)   milrinone 0.375 mcg/kg/min (02/28/22 1442)   PRN Meds:.acetaminophen, glycopyrrolate **OR** glycopyrrolate **OR** glycopyrrolate, haloperidol **OR** haloperidol **OR** haloperidol lactate, HYDROmorphone (DILAUDID) injection, LORazepam **OR** LORazepam **OR** LORazepam, polyvinyl alcohol, sodium chloride flush Allergies  Allergen Reactions   Allopurinol Shortness Of Breath   Doxycycline Hives   Farxiga [Dapagliflozin]     Can not tolerate - dizzy weak almost passed out   Review of Systems  Unable to perform ROS: Acuity of condition    Physical Exam Constitutional:      General: He is not in acute distress.    Appearance: He is ill-appearing.     Comments: Eyes remain closed during conversation but patient would respond with short answers when spoken to   Pulmonary:     Effort: Pulmonary effort is normal.  Skin:    General: Skin is warm and dry.     Vital Signs: BP (!) 81/48   Pulse (!) 116   Temp (!) 97.3 F (36.3 C)   Resp 16   Ht  $'5\' 11"'j$  (1.803 m)   Wt 87.9 kg   SpO2 96%   BMI 27.03 kg/m  Pain Scale: 0-10   Pain Score: 8    SpO2: SpO2: 96 % O2 Device:SpO2: 96 % O2 Flow Rate: .O2 Flow Rate (L/min): 2 L/min  IO: Intake/output summary:  Intake/Output Summary (Last 24 hours) at 02/28/2022 1537 Last data filed at 02/28/2022 1500 Gross per 24 hour  Intake 1457.15 ml  Output 300 ml  Net 1157.15 ml    LBM: Last BM Date : 02/27/22 Baseline Weight: Weight: 84.6 kg Most recent weight: Weight: 87.9 kg     Palliative Assessment/Data: PPS 40%     *Please note that this is a verbal dictation therefore any spelling or grammatical errors are due to the "[place] [employer]" system interpretation.   Juel Burrow, DNP, AGNP-C Palliative Medicine Team [phone] Pager: [phone]

## 2022-02-28 NOTE — Progress Notes (Signed)
[facility] Progress Note    Assessment/ Plan:   Acute kidney injury on CKD3b - baseline creatinine appears have progressed since 2021 and is currently ~2-2.5. Urinalysis shows minimal proteinuria and does not display an active sediment. Renal function appears to be progressively worse since 01/31/22 which frequent variation. Ultrasound 6/16 neg for obstructive uropathy. AKI likely from [place]. - He appears to be overloaded with a JVD, lower extremity edema and weight is ~84.9 kg @ admission. His weight was 81kg at the cardiology appt in late May 2023. Also his wife said his appetite has not been good overall. Weight now up 1.3kg since hospitalization with worsening of renal function c/w CRS. - not really improving - not responding to Lasix gtt- I think palliative is appropriate- appreciate.. - on milrinone now - added strict I/O - increased midodrine to 15 TID -Maintain MAP>65 for optimal renal perfusion.  - Avoid nephrotoxic medications including NSAIDs and iodinated intravenous contrast exposure unless the latter is absolutely indicated.   - Preferred narcotic agents for pain control are hydromorphone, fentanyl, and methadone. Morphine should not be used.  - Avoid Baclofen and avoid oral sodium phosphate and magnesium citrate based laxatives / bowel preps.  - nothing further to add- will sign off.  Call with questions.     Encephalopathy - could this be from slowly worsening renal function? CT shows only a small chronic infarct which is new.  HFrEF - TTE 02/26/22 shows severe AS with a barely opening aortic valve (low flow/ output state), severely dilated LA, EF < 20%.  On milrinone  DM  Atrial fibrillation with a supratherapeutic INR  Diffuse large cell lymphoma (diagnosed in 2014, status post R-CHOP in 2014, now in remission)  8.  AS: not a TAVR candidate per notes  9.  Dispo: [place] discussions appropriate at any time  Subjective:    Not responding to Lasix gtt.   Sleeping this AM- I did not wake     Objective:   BP (!) 81/48   Pulse (!) 116   Temp (!) 97.3 F (36.3 C)   Resp 16   Ht '5\' 11"'$  (1.803 m)   Wt 87.9 kg   SpO2 96%   BMI 27.03 kg/m   Intake/Output Summary (Last 24 hours) at 02/28/2022 1333 Last data filed at 02/28/2022 0840 Gross per 24 hour  Intake 1211.89 ml  Output 300 ml  Net 911.89 ml   Weight change: 0.635 kg  Physical Exam: Gen:NAD, lying in bed CVS: tachy on monitor Resp: unlabored Abd: soft Ext: 2+ LE edema with open blistered wounds bilaterally, 1+ UE edema NEURO: sleeping Imaging: No results found.  Labs: BMET Recent Labs  Lab 02/22/22 1427 02/23/22 0453 02/25/22 0300 02/26/22 0452 02/27/22 0427 02/28/22 0437  NA 135 137 135 135 135 132*  K 3.3* 3.6 3.8 3.7 3.6 4.0  CL 93* 94* 95* 94* 95* 93*  CO2 25 27 21* '24 26 24  '$ GLUCOSE 231* 181* 149* 173* 149* 236*  BUN 129* 133* 141* 136* 141* 142*  CREATININE 4.04* 4.15* 4.20* 4.32* 4.16* 4.45*  CALCIUM 9.7 8.7* 8.6* 8.5* 8.5* 8.5*  PHOS  --  4.5 5.1* 4.6  --   --    CBC Recent Labs  Lab 02/22/22 1427 02/23/22 0453 02/25/22 0300 02/26/22 0452  WBC 4.6 3.8* 4.2 4.4  NEUTROABS  --  2.7  --   --   HGB 12.6* 12.0* 12.8* 11.9*  HCT 40.1 38.4* 39.8 36.3*  MCV 86.2 87.5  86.3 84.6  PLT 170 148* 164 158    Medications:     Chlorhexidine Gluconate Cloth  6 each Topical Daily   insulin aspart  0-5 Units Subcutaneous QHS   insulin aspart  0-9 Units Subcutaneous TID WC   metolazone  5 mg Oral Daily   midodrine  15 mg Oral TID WC   pantoprazole  40 mg Oral Daily   rosuvastatin  40 mg Oral QODAY   sodium chloride flush  10-40 mL Intracatheter Q12H   warfarin  2.5 mg Oral ONCE-1600   Warfarin - Pharmacist Dosing Inpatient   Does not apply q1600      Madelon Lips, MD 02/28/2022, 1:33 PM

## 2022-02-28 NOTE — Plan of Care (Signed)
  Problem: Clinical Measurements: Goal: Will remain free from infection Outcome: Not Progressing   Problem: Activity: Goal: Risk for activity intolerance will decrease Outcome: Not Progressing   Problem: Nutrition: Goal: Adequate nutrition will be maintained Outcome: Not Progressing

## 2022-02-28 NOTE — Plan of Care (Signed)
  Problem: Clinical Measurements: Goal: Ability to maintain clinical measurements within normal limits will improve Outcome: Not Progressing Goal: Cardiovascular complication will be avoided Outcome: Not Progressing   

## 2022-03-01 ENCOUNTER — Ambulatory Visit (HOSPITAL_BASED_OUTPATIENT_CLINIC_OR_DEPARTMENT_OTHER): Payer: HMO | Admitting: General Surgery

## 2022-03-01 DIAGNOSIS — L8990 Pressure ulcer of unspecified site, unspecified stage: Secondary | ICD-10-CM | POA: Insufficient documentation

## 2022-03-01 DIAGNOSIS — R451 Restlessness and agitation: Secondary | ICD-10-CM

## 2022-03-01 DIAGNOSIS — Z789 Other specified health status: Secondary | ICD-10-CM

## 2022-03-01 DIAGNOSIS — E1169 Type 2 diabetes mellitus with other specified complication: Secondary | ICD-10-CM | POA: Diagnosis not present

## 2022-03-01 DIAGNOSIS — R638 Other symptoms and signs concerning food and fluid intake: Secondary | ICD-10-CM

## 2022-03-01 DIAGNOSIS — Z515 Encounter for palliative care: Secondary | ICD-10-CM | POA: Diagnosis not present

## 2022-03-01 DIAGNOSIS — I5023 Acute on chronic systolic (congestive) heart failure: Secondary | ICD-10-CM | POA: Diagnosis not present

## 2022-03-01 DIAGNOSIS — R410 Disorientation, unspecified: Secondary | ICD-10-CM | POA: Diagnosis not present

## 2022-03-01 DIAGNOSIS — N179 Acute kidney failure, unspecified: Secondary | ICD-10-CM | POA: Diagnosis not present

## 2022-03-01 DIAGNOSIS — I482 Chronic atrial fibrillation, unspecified: Secondary | ICD-10-CM | POA: Diagnosis not present

## 2022-03-01 LAB — GLUCOSE, CAPILLARY
Glucose-Capillary: 143 mg/dL — ABNORMAL HIGH (ref 70–99)
Glucose-Capillary: 153 mg/dL — ABNORMAL HIGH (ref 70–99)

## 2022-03-01 MED ORDER — CAMPHOR-MENTHOL 0.5-0.5 % EX LOTN: TOPICAL_LOTION | CUTANEOUS | Status: DC | PRN

## 2022-03-01 MED ORDER — DIPHENHYDRAMINE HCL 50 MG/ML IJ SOLN
50.0000 mg | Freq: Four times a day (QID) | INTRAMUSCULAR | Status: DC | PRN
Start: 2022-03-01 — End: 2022-03-03

## 2022-03-01 MED ORDER — DIPHENHYDRAMINE HCL 25 MG PO CAPS
25.0000 mg | ORAL_CAPSULE | Freq: Once | ORAL | Status: AC
  Administered 2022-03-01: 25 mg via ORAL
  Filled 2022-03-01: qty 1

## 2022-03-01 MED ORDER — LORAZEPAM 2 MG/ML IJ SOLN
1.0000 mg | INTRAMUSCULAR | Status: DC | PRN
Start: 2022-03-01 — End: 2022-03-03

## 2022-03-01 MED ORDER — HYDROMORPHONE HCL 1 MG/ML IJ SOLN
0.5000 mg | Freq: Four times a day (QID) | INTRAMUSCULAR | Status: DC
  Administered 2022-03-01 – 2022-03-02 (×5): 0.5 mg via INTRAVENOUS
  Filled 2022-03-01 (×5): qty 0.5

## 2022-03-01 MED ORDER — FUROSEMIDE 40 MG PO TABS
80.0000 mg | ORAL_TABLET | Freq: Two times a day (BID) | ORAL | Status: DC
  Administered 2022-03-01 – 2022-03-02 (×2): 80 mg via ORAL
  Filled 2022-03-01 (×2): qty 2

## 2022-03-01 MED ORDER — LORAZEPAM 2 MG/ML PO CONC: 1.0000 mg | ORAL | Status: DC | PRN

## 2022-03-01 MED ORDER — WARFARIN SODIUM 2.5 MG PO TABS: 2.5000 mg | ORAL_TABLET | Freq: Every day | ORAL | Status: DC

## 2022-03-01 MED ORDER — GLYCOPYRROLATE 0.2 MG/ML IJ SOLN
0.2000 mg | Freq: Four times a day (QID) | INTRAMUSCULAR | Status: DC
  Administered 2022-03-01 – 2022-03-02 (×4): 0.2 mg via INTRAVENOUS
  Filled 2022-03-01 (×4): qty 1

## 2022-03-01 MED ORDER — LORAZEPAM 1 MG PO TABS
1.0000 mg | ORAL_TABLET | ORAL | Status: DC | PRN
  Administered 2022-03-02: 1 mg via ORAL
  Filled 2022-03-01: qty 1

## 2022-03-01 NOTE — Progress Notes (Signed)
Patient John [name]      DOB: Jul 12, 1951      XJD:[id number]      Palliative Medicine Team    Subjective: Bedside symptom check completed. Wife Marcie Bal bedside at this time.    Physical exam: Patient resting in bed with eyes closed at time of visit. Breathing even and non-labored, no excessive secretions noted. Patient without physical or non-verbal signs of pain or discomfort at this time. Patient with intermittent acknowledgement of this Rn, very drowsy.    Assessment and plan: Patient denies pain or discomfort at this time. Wife endorses that the patient had refused his pain medicine over night and this morning was requiring ativan. This RN shared plan to try to encourage scheduled pain medicine to be received by patient, to stay on top of pain before having break-though severe pain. Wife and patient in agreement. Wife shares that she is ready to discontinue all of the cardiac infusions currently running and turn to full comfort care. She shares that it is her goal to have the patient moved to inpatient hospice closer to where they live, in [place], [place]. This RN plans with the wife to first discontinue the infusions and then reassess for stability for transfer pending bed availability and acceptance into hospice. She is understanding and in agreement. Bedside RN Beverlee Nims with out any needs or concerns this morning. This RN reached out to [employer], NP with findings and she will follow up. Will continue to follow for any changes or advances.    Thank you for allowing the Palliative Medicine Team to assist in the care of this patient.     Damian Leavell, MSN, RN Palliative Medicine Team Team Phone: [phone]  This phone is monitored 7a-7p, please reach out to attending physician outside of these hours for urgent needs.

## 2022-03-01 NOTE — Progress Notes (Addendum)
Progress Note  Patient Name: John [name] Date of Encounter: 03/01/2022  Primary Cardiologist: Rollene Rotunda, MD  Subjective   Feels weak, tired. Son Pleak at bedside. Had itching of his legs overnight, some relief with Benadryl.  Inpatient Medications    Scheduled Meds:  antiseptic oral rinse  15 mL Topical TID   insulin aspart  0-5 Units Subcutaneous QHS   insulin aspart  0-9 Units Subcutaneous TID WC   insulin glargine-yfgn  5 Units Subcutaneous QHS   metolazone  5 mg Oral Daily   midodrine  15 mg Oral TID WC   pantoprazole  40 mg Oral Daily   sodium chloride flush  10-40 mL Intracatheter Q12H   warfarin  2.5 mg Oral q1600   Warfarin - Pharmacist Dosing Inpatient   Does not apply q1600   Continuous Infusions:  amiodarone 30 mg/hr (03/01/22 0740)   furosemide (LASIX) 200 mg in dextrose 5 % 100 mL (2 mg/mL) infusion 8 mg/hr (03/01/22 0740)   milrinone 0.375 mcg/kg/min (03/01/22 0740)   PRN Meds: acetaminophen, glycopyrrolate **OR** glycopyrrolate **OR** glycopyrrolate, haloperidol **OR** haloperidol **OR** haloperidol lactate, HYDROmorphone (DILAUDID) injection, LORazepam **OR** LORazepam **OR** LORazepam, polyvinyl alcohol, sodium chloride flush   Vital Signs    Vitals:   02/28/22 0839 02/28/22 1200 02/28/22 1600 02/28/22 2051  BP: (!) 86/57 (!) 81/48 (!) 82/47 (!) 75/49  Pulse:  (!) 116 (!) 110 95  Resp:  16  16  Temp:  (!) 97.3 F (36.3 C) 98.1 F (36.7 C) 98.1 F (36.7 C)  TempSrc:   Oral Oral  SpO2:  96% 94% 94%  Weight:      Height:        Intake/Output Summary (Last 24 hours) at 03/01/2022 0750 Last data filed at 03/01/2022 0740 Gross per 24 hour  Intake 1072.45 ml  Output 100 ml  Net 972.45 ml      02/28/2022    2:25 AM 02/27/2022    4:09 AM 02/25/2022    4:58 AM  Last 3 Weights  Weight (lbs) 193 lb 12.8 oz 192 lb 6.4 oz 189 lb  Weight (kg) 87.907 kg 87.272 kg 85.73 kg     Telemetry    Atrial fib/flutter HR ~100 - Personally  Reviewed  Physical Exam   GEN: No acute distress. Weak, frail appearing. HEENT: Normocephalic, atraumatic, sclera non-icteric. Neck: + JVD Cardiac: borderline tachycardic, irregularly, 2/6 SEM no rubs or gallops.  Respiratory: Clear to auscultation bilaterally. Breathing is unlabored. GI: Soft, nontender, non-distended, BS +x 4. MS: no deformity. Extremities: No clubbing or cyanosis. Mild LE edema with bilateral wrapped legs Neuro:  AAOx3. Follows commands. Fatigued appearing. Psych: Flat affect.  Labs    High Sensitivity Troponin:   Recent Labs  Lab 02/22/22 1427 02/22/22 1633  TROPONINIHS 54* 61*      Cardiac EnzymesNo results for input(s): "TROPONINI" in the last 168 hours. No results for input(s): "TROPIPOC" in the last 168 hours.   Chemistry Recent Labs  Lab 02/22/22 1427 02/23/22 0453 02/25/22 0300 02/26/22 0452 02/27/22 0427 02/28/22 0437  NA 135 137 135 135 135 132*  K 3.3* 3.6 3.8 3.7 3.6 4.0  CL 93* 94* 95* 94* 95* 93*  CO2 25 27 21* 24 26 24   GLUCOSE 231* 181* 149* 173* 149* 236*  BUN 129* 133* 141* 136* 141* 142*  CREATININE 4.04* 4.15* 4.20* 4.32* 4.16* 4.45*  CALCIUM 9.7 8.7* 8.6* 8.5* 8.5* 8.5*  PROT 6.6 6.0*  --   --   --   --  ALBUMIN 3.4* 2.7* 2.8* 2.6*  --   --   AST 27 25  --   --   --   --   ALT 13 16  --   --   --   --   ALKPHOS 323* 304*  --   --   --   --   BILITOT 2.1* 2.0*  --   --   --   --   GFRNONAA 15* 15* 15* 14* 15* 14*  ANIONGAP 17* 16* 19* 17* 14 15     Hematology Recent Labs  Lab 02/23/22 0453 02/25/22 0300 02/26/22 0452  WBC 3.8* 4.2 4.4  RBC 4.39 4.61 4.29  HGB 12.0* 12.8* 11.9*  HCT 38.4* 39.8 36.3*  MCV 87.5 86.3 84.6  MCH 27.3 27.8 27.7  MCHC 31.3 32.2 32.8  RDW 17.4* 17.6* 17.6*  PLT 148* 164 158    BNP Recent Labs  Lab 02/22/22 1426  BNP 3,004.6*     DDimer No results for input(s): "DDIMER" in the last 168 hours.   Radiology    No results found.  Cardiac Studies   Limited echo 02/26/22  1.  Left ventricular ejection fraction, by estimation, is <20%. The left  ventricle has severely decreased function. The left ventricle demonstrates  global hypokinesis. The left ventricular internal cavity size was severely  dilated. Left ventricular  diastolic parameters are indeterminate. There is the interventricular  septum is flattened in systole, consistent with right ventricular pressure  overload.   2. Right ventricular systolic function is severely reduced. There is  moderately elevated pulmonary artery systolic pressure.   3. Left atrial size was severely dilated.   4. Right atrial size was moderately dilated.   5. The mitral valve has been repaired/replaced. The mean mitral valve  gradient is 4.7 mmHg. There is a mechanical valve present in the mitral  position. Echo findings are consistent with normal structure and function  of the mitral valve prosthesis.   6. Tricuspid valve regurgitation is mild to moderate.   7. Aotic valve is heavily calcified and barely opens. Suspect severe  low-flow/low-gradient AS      . The aortic valve is tricuspid. There is severe calcifcation of the  aortic valve. Aortic valve regurgitation is mild. Aortic regurgitation PHT  measures 207 msec. Aortic valve area, by VTI measures 1.27 cm. Aortic  valve mean gradient measures 9.8  mmHg. Aortic valve Vmax measures 2.11 m/s.   8. The inferior vena cava is dilated in size with <50% respiratory  variability, suggesting right atrial pressure of 15 mmHg.   Patient Profile     71 y.o. male with CAD s/p CABG 2002, DES to Cx 06/2020, mechanical MVR in 2002 in Coumadin, aortic stenosis (not felt to be good TAVR candidate), AI, TR, chronic systolic heart failure, permanent afib, 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 Has had recent complex course as outlined in consult note. Admitted with confusion/encephalopathy and persistent hypotension.  Assessment & Plan    1. Subacute encephalopathy with confusion for several weeks - may be multifactorial in setting of low output state and uremia - transitioning towards comfort approach per palliative consult notes   2. Acute on chronic systolic CHF with suspected cardiogenic shock - guideline directed therapy severely limited by hypotension, AKI - has not responded well  to IV inotropes or Lasix - will review further with MD about med regimen during transition to comfort approach -> current cardiac regimen includes metolazone 5mg  daily, midodrine 15mg  TID, IV amiodarone, IV Lasix drip @ 8mg /hr, IV milrinone - nurse indicates Dr. Antoine Poche had planned to stop metolazone yesterday, will hold this AM pending his review   3. AKI superimposed on CKD stage 3b with uremia - nephrology consulted this admission, felt compatible with cardiorenal syndrome and agreed palliative was appropriate   4. 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 Permanent atrial fibrillation with known RBBB - rates 90s-100s, beta blocker stopped due to hypotension PTA - no role for cardioversion with permanent state - on warfarin for MVR   6. [employer]. Jude ICD in place - followed by EP as OP - ICD deactivated per palliative consult note 02/28/22 - I confirmed with Bonna Gains, [employer] rep that ICD device therapies were turned off   7. Rheumatic heart disease with mechanical MVR 2002 on chronic warfarin, suspected LF/LG severe AS - limited echo this admission with normal MV function, LF-LG severe AS, mild AI, mild-moderate TR - conservative management - pharmacy following Coumadin   8. CAD s/p CABG 2002, PCI 06/2020 - Plavix stopped as he was  >1 year out from PCI, not on ASA given concomitant warfarin - hsTroponin low/flat, not felt to reflect ACS  For questions or updates, please contact [facility] Please consult [email or web address] for contact info under Cardiology/STEMI.  Signed, Laurann Montana, PA-C 03/01/2022, 7:50 AM    History and all data above reviewed.  Patient examined.  I agree with the findings as above.  Complains of itching.  Wife is ready for terminal care.  No acute SOB.  The patient exam reveals JYN:[id number], systolic murmur   ,  Lungs: Decreased breath sounds  ,  Abd:   Decreased bowel sounds, Ext   Severe diffuse edema  .  All available labs, radiology testing, previous records reviewed. Agree with documented assessment and plan. Acute systolic and diastolic combined right and left ventricular heart failure:  End of life.  Will change to PO diuretics.  No improvement with inotropes or IV diuretic will stop.    Atrial fib:  Stop amiodarone.    Fayrene Fearing [facility]  10:34 AM  03/01/2022

## 2022-03-01 NOTE — Progress Notes (Signed)
  Progress Note   Patient: John [name]:[id number] DOB: 04/22/1951 DOA: 02/22/2022     7 DOS: the patient was seen and examined on 03/01/2022   Brief hospital course: 71 year old man presenting with confusion, hypotension. Treated for acute on chronic CHF, low output with failure to respond to inotrope and Lasix.  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  Given failure to improve, cardiology nephrology recommended palliative intervention and patient has elected to move towards comfort care.  CAD s/p CABG 2002, PCI 06/2020 - Plavix stopped as he was >1 year out from PCI, not on ASA given concomitant warfarin - hsTroponin low/flat, not felt to reflect ACS  Assessment and Plan: * Acute on chronic systolic CHF (congestive heart failure) ([place]) --echo LVEF <20%; rheumatic heart disease, sp mitral valve replacement, cardiogenic shock/ low output heart failure decompensation; aortic stenosis with low gradient. Not TAVR candidate  --now full comfort care; pt at end of life; did not respond to milrinone or nitroglycerin infusions.   AKI superimposed on CKD stage 3b. AKI secondary to cardiorenal syndrome. --failed to respond to furosemide infusion --nephrology signed off --comfort care   Chronic atrial fibrillation (HCC) --Atrial flutter with rapid ventricular response; not candidate for AV blockade due to low output heart failure.  --now comfort care; amiodarone and warfarin stopped   Essential HTN   --hypotension secondary to low output heart failure   Rheumatic heart disease --S/p mechanical mitral valve replacement --warfarin stopped based on above   Type 2 diabetes mellitus with hyperlipidemia ([name]) --no further invention; insulin and CBG checks stopped.  CAD s/p CABG 2002, PCI 06/2020 -- no treatment     Subjective:  Sleeping per wife Transitioned to  comfort care, now off infusions.  Physical Exam: Vitals:   02/28/22 1600 02/28/22 2051 03/01/22 0827 03/01/22 0828  BP: (!) 82/47 (!) 75/49 (!) 77/35   Pulse: (!) 110 95  98  Resp:  16    Temp: 98.1 F (36.7 C) 98.1 F (36.7 C)    TempSrc: Oral Oral    SpO2: 94% 94%  94%  Weight:      Height:       Physical Exam Vitals reviewed.  Constitutional:      General: He is not in acute distress.    Appearance: He is not ill-appearing or toxic-appearing.  Cardiovascular:     Rate and Rhythm: Normal rate and regular rhythm.     Heart sounds: No murmur heard. Pulmonary:     Effort: Pulmonary effort is normal. No respiratory distress.     Breath sounds: No wheezing, rhonchi or rales.  Neurological:     Mental Status: He is alert.      Data Reviewed:  UOP 100 CBG labile  Family Communication: wife at bedside  Disposition: Status is: Inpatient Remains inpatient appropriate because: now comfort care  Planned Discharge Destination:  TBD    Time spent: 25 minutes  Author: Murray Hodgkins, MD 03/01/2022 5:41 PM  For on call review [email or web address].

## 2022-03-01 NOTE — Progress Notes (Signed)
Daily Progress Note   Patient Name: John [name]       Date: 03/01/2022 DOB: [birth year]  Age: 71 y.o. MRN#: [id number] Attending Physician: Samuella Cota, MD Primary Care Physician: Claretta Fraise, MD Admit Date: 02/22/2022  Reason for Consultation/Follow-up: Non pain symptom management, Pain control, Psychosocial/spiritual support, and Terminal Care  Subjective: Chart review performed.  Received report from primary RN -no acute concerns.  Reports patient has been lethargic; however, when he is awake he is restless and seems very uncomfortable but refuses medications.  RN reports patient has minimal oral intake. Verified ICD was deactivated.   Went to visit patient at bedside -wife/Janet present.  Patient was lying in bed asleep -he does wake to voice/gentle touch but falls back asleep before visit is over.  No signs or non-verbal gestures of pain or discomfort noted. No respiratory distress, increased work of breathing, or secretions noted.  Patient reports he "feels rotten."  When asked to clarify what specific symptoms may be bothering him he reports he is "coughing up phlegm" and this is his main concern.  He also reports itching that was minimally relieved by 25 mg of Benadryl.  He has generalized discomfort. Marcie Bal reports patient had a "very rough night" last night by "beating on bed rails and being very restless/agitated"  She feels he is in distress but refused medication to help with symptoms.  Patient was finally accepting of Dilaudid which she reports relieved his symptoms and helped him relax more-so than the Ativan that he was given.  Reviewed symptom management plan in detail.  Discussed scheduling medication to decrease respiratory secretions and manage discomfort in a way where he  would not have to ask for pain medication.  Also discussed adding as needed Benadryl 50 mg as well as Sarna cream for breakthrough itching symptoms.  Patient has bilateral lower extremity bandages in place -they are clean dry and intact. Marcie Bal wonders if bandaged need to be changed. Reviewed that continuing palliative wound care could be beneficial to keep wounds clean.   Therapeutic listening and emotional support provided as wife reviewed patient's decline since March of this year -she "has seen him suffer since March."  Discussed stopping other unnecessary end-of-life interventions to include insulin/CBG, milrinone, warfarin -wife was agreeable.  Reviewed that Lasix could provide benefit to prevent fluid overload;  however, this too, would likely be discontinued soon as patient is not eating or drinking.   Reviewed with Marcie Bal that patient is likely not stable for transfer to residential hospice facility at this time.  Did offer transfer option if family's goal were to have him moved to hospice facility.  Marcie Bal was agreeable for patient to remain in-house for end-of-life care as she does not wish to take risk of patient passing away in transport.  All questions and concerns addressed. Encouraged to call with questions and/or concerns. PMT card provided.  Length of Stay: 7  Current Medications: Scheduled Meds:   antiseptic oral rinse  15 mL Topical TID   furosemide  80 mg Oral BID   insulin aspart  0-5 Units Subcutaneous QHS   insulin aspart  0-9 Units Subcutaneous TID WC   insulin glargine-yfgn  5 Units Subcutaneous QHS   midodrine  15 mg Oral TID WC   pantoprazole  40 mg Oral Daily   sodium chloride flush  10-40 mL Intracatheter Q12H   warfarin  2.5 mg Oral q1600   Warfarin - Pharmacist Dosing Inpatient   Does not apply q1600    Continuous Infusions:  milrinone 0.375 mcg/kg/min (03/01/22 0858)    PRN Meds: acetaminophen, glycopyrrolate **OR** glycopyrrolate **OR** glycopyrrolate,  haloperidol **OR** haloperidol **OR** haloperidol lactate, HYDROmorphone (DILAUDID) injection, LORazepam **OR** LORazepam **OR** LORazepam, polyvinyl alcohol, sodium chloride flush  Physical Exam Vitals and nursing note reviewed.  Constitutional:      General: He is not in acute distress.    Appearance: He is ill-appearing.  Pulmonary:     Effort: No respiratory distress.  Skin:    General: Skin is warm and dry.  Neurological:     Mental Status: He is lethargic.     Motor: Weakness present.  Psychiatric:        Speech: Speech is delayed.        Behavior: Behavior is cooperative.        Cognition and Memory: Cognition is impaired. Memory is impaired.        Judgment: Judgment is impulsive.             Vital Signs: BP (!) 77/35 (BP Location: Right Arm)   Pulse 98   Temp 98.1 F (36.7 C) (Oral)   Resp 16   Ht '5\' 11"'$  (1.803 m)   Wt 87.9 kg   SpO2 94%   BMI 27.03 kg/m  SpO2: SpO2: 94 % O2 Device: O2 Device: Room Air O2 Flow Rate: O2 Flow Rate (L/min): 2 L/min  Intake/output summary:  Intake/Output Summary (Last 24 hours) at 03/01/2022 1103 Last data filed at 03/01/2022 0827 Gross per 24 hour  Intake 982.45 ml  Output 100 ml  Net 882.45 ml   LBM: Last BM Date : 02/27/22 Baseline Weight: Weight: 84.6 kg Most recent weight: Weight: 87.9 kg       Palliative Assessment/Data: PPS 20%      Patient Active Problem List   Diagnosis Date Noted   Pressure injury of skin 03/01/2022   Rheumatic heart disease 02/24/2022   Gastroesophageal reflux disease 02/23/2022   Hypertensive heart and chronic kidney disease with heart failure and stage 1 through stage 4 chronic kidney disease, or chronic kidney disease ([place]) 12/02/2021   Acute kidney injury superimposed on chronic kidney disease ([place]) 12/02/2021   Gout 12/02/2021   Iron deficiency anemia 12/02/2021   Acute on chronic systolic (congestive) heart failure (HCC) 11/17/2021   Chronic diastolic HF (heart failure) ([place])  07/25/2021   CRI (chronic renal insufficiency), stage 3 (moderate) (HCC) 11/08/2020   Acute on chronic systolic CHF (congestive heart failure) ([place]) 08/23/2020   Hypoglycemia 07/01/2020   Aortic valve stenosis    ICD (implantable cardioverter-defibrillator) in place 43/15/4008   Systolic CHF ([place]) 67/61/9509   Long term (current) use of anticoagulants [Z79.01] 10/24/2017   Type 2 diabetes mellitus with hyperlipidemia ([place]) 09/23/2017   Encounter for therapeutic drug monitoring 05/06/2017   Incisional hernia 03/15/2014   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 male  with past medical history of heart failure with reduced systolic function, CAD, mechanical mitral valve replacement, aortic valve stenosis, rheumatic heart disease, coronary artery disease, CKD stage 3b, atrial fibrillation, and T2DM  admitted on 02/22/2022 with AMS.  Patient found to have acute on chronic systolic CHF with a EF less than 20%.  Patient diagnosed with cardiogenic shock.  Also with acute kidney injury and very poor response to diuretic therapy.  Patient has been clear he does not want dialysis and also unlikely he would tolerate.  PMT consulted to discuss goals of care.  Assessment: Principal Problem:   Acute on chronic systolic CHF (congestive heart failure) (HCC) Active Problems:   HTN (hypertension)   Chronic atrial fibrillation (HCC)   Type 2 diabetes mellitus with hyperlipidemia (HCC)   Acute kidney injury superimposed on chronic kidney disease (HCC)   Gastroesophageal reflux disease   Rheumatic heart disease   Pressure injury of skin    Terminal care  Recommendations/Plan: Continue full comfort measures Continue DNR/DNI as previously documented Patient is likely not stable for transfer to residential hospice facility at this time - anticipate hospital death Added orders for EOL symptom management and to reflect full comfort measures, as well as discontinued orders that were not focused on comfort Continue palliative wound care Unrestricted visitation orders were placed per current [place] EOL visitation policy  Nursing to provide frequent assessments and administer PRN medications as clinically necessary to ensure EOL comfort PMT will continue to follow and support holistically  Symptom Management Scheduled Dilaudid every 6 hours for generalized discomfort; continue PRN doses for breakthrough symptoms Scheduled Robinul every 6 hours for respiratory secretions; continue PRN doses for breakthrough symptoms Discontinued insulin/CBGs, warfarin, midodrine Continue Lasix for now - recommend discontinuing in the next 1 to 2 days pending symptoms Tylenol PRN pain/fever Biotin twice daily Benadryl and Sarna cream PRN itching Haldol PRN agitation/delirium Ativan PRN anxiety/seizure/sleep/distress Zofran PRN nausea/vomiting Liquifilm Tears PRN dry eye   Goals of Care and Additional Recommendations: Limitations on Scope of Treatment: Full Comfort Care  Code Status:    Code Status Orders  (From admission, onward)           Start     Ordered   02/28/22 1530  Do not attempt resuscitation (DNR)  Continuous       Question Answer Comment  In the event of cardiac or respiratory ARREST Do not call a "code blue"   In the event of cardiac or respiratory ARREST Do not perform Intubation, CPR, defibrillation or ACLS   In the event of cardiac or respiratory ARREST  Use medication by any route, position, wound care, and other measures to relive pain and suffering. May use oxygen, suction and manual treatment of airway  obstruction as needed for comfort.      02/28/22 1530           Code Status History     Date Active Date Inactive Code Status Order ID Comments User Context   02/27/2022 1449 02/28/2022 1530 DNR [id number]  Tawni Millers, MD Inpatient   02/22/2022 2345 02/27/2022 1449 Full Code [id number]  Kayleen Memos, DO Inpatient   11/17/2021 1254 12/02/2021 1739 Full Code [id number]  Conrad [place], NP Inpatient   06/20/2020 1650 07/01/2020 1838 Full Code [id number]  Roney Jaffe, MD ED   02/19/2020 1227 02/19/2020 2249 Full Code [id number]  Patsey Berthold, RN Inpatient   05/10/2017 1157 05/10/2017 1906 Full Code [id number]  [place], Peter M, MD Inpatient   03/15/2014 1044 03/17/2014 1409 Full Code [id number]  Coralie Keens, MD Inpatient   02/09/2013 1411 02/20/2013 0019 Full Code [id number]  Harl Bowie, MD Inpatient      Advance Directive Documentation    Flowsheet Row Most Recent Value  Type of Advance Directive Healthcare Power of Attorney  Pre-existing out of facility DNR order (yellow form or pink MOST form) --  "MOST" Form in Place? --       Prognosis:  Hours - Days  Discharge Planning: Anticipated Hospital Death  Care plan was discussed with primary RN, patient, patient's wife  Thank you for allowing the Palliative Medicine Team to assist in the care of this patient.   Total Time 60 minutes Prolonged Time Billed  no       Greater than 50%  of this time was spent counseling and coordinating care related to the above assessment and plan.  Lin Landsman, NP  Please contact Palliative Medicine Team phone at [phone] for questions and concerns.   *Portions of this note are a verbal dictation therefore any spelling and/or grammatical errors are due to the "[employer] [employer]" system interpretation.

## 2022-03-02 DIAGNOSIS — E1169 Type 2 diabetes mellitus with other specified complication: Secondary | ICD-10-CM | POA: Diagnosis not present

## 2022-03-02 DIAGNOSIS — Z66 Do not resuscitate: Secondary | ICD-10-CM | POA: Diagnosis not present

## 2022-03-02 DIAGNOSIS — Z7189 Other specified counseling: Secondary | ICD-10-CM | POA: Diagnosis not present

## 2022-03-02 DIAGNOSIS — R6889 Other general symptoms and signs: Secondary | ICD-10-CM

## 2022-03-02 DIAGNOSIS — I5023 Acute on chronic systolic (congestive) heart failure: Secondary | ICD-10-CM | POA: Diagnosis not present

## 2022-03-02 DIAGNOSIS — Z515 Encounter for palliative care: Secondary | ICD-10-CM | POA: Diagnosis not present

## 2022-03-02 DIAGNOSIS — N189 Chronic kidney disease, unspecified: Secondary | ICD-10-CM | POA: Diagnosis not present

## 2022-03-02 DIAGNOSIS — N179 Acute kidney failure, unspecified: Secondary | ICD-10-CM | POA: Diagnosis not present

## 2022-03-02 LAB — GLUCOSE, CAPILLARY: Glucose-Capillary: 225 mg/dL — ABNORMAL HIGH (ref 70–99)

## 2022-03-02 LAB — SARS CORONAVIRUS 2 BY RT PCR: SARS Coronavirus 2 by RT PCR: NEGATIVE

## 2022-03-02 MED ORDER — GLYCOPYRROLATE 0.2 MG/ML IJ SOLN
0.2000 mg | Freq: Two times a day (BID) | INTRAMUSCULAR | Status: DC
Start: 2022-03-02 — End: 2022-03-03

## 2022-03-02 MED ORDER — NITROGLYCERIN 0.4 MG SL SUBL: 0.4000 mg | SUBLINGUAL_TABLET | SUBLINGUAL | 10 refills | Status: AC | PRN

## 2022-03-02 MED ORDER — POLYVINYL ALCOHOL 1.4 % OP SOLN: 1.0000 [drp] | Freq: Four times a day (QID) | OPHTHALMIC | 0 refills | Status: AC | PRN

## 2022-03-02 MED ORDER — FUROSEMIDE 80 MG PO TABS: 80.0000 mg | ORAL_TABLET | Freq: Two times a day (BID) | ORAL | Status: AC

## 2022-03-02 NOTE — Plan of Care (Signed)

## 2022-03-02 NOTE — Discharge Summary (Signed)
Physician Discharge Summary   Patient: John [name] MRN: [id number] DOB: 02-10-1951  Admit date:     02/22/2022  Discharge date: 03/02/22  Discharge Physician: Brendia Sacks   PCP: Mechele Claude, MD   Recommendations at discharge:   End of life care; [facility]  Discharge Diagnoses: Principal Problem:   Acute on chronic systolic CHF (congestive heart failure) (HCC) Active Problems:   Acute kidney injury superimposed on chronic kidney disease (HCC)   Chronic atrial fibrillation (HCC)   Gastroesophageal reflux disease   HTN (hypertension)   Rheumatic heart disease   Type 2 diabetes mellitus with hyperlipidemia (HCC)   Pressure injury of skin  Resolved Problems:   * No resolved hospital problems. *  Hospital Course: 71 year old man presented with confusion, hypotension. Treated for acute on chronic CHF, low output with failure to respond to inotrope and Lasix.  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  Given failure to improve, cardiology and nephrology recommended palliative intervention. Patient and family accepting and patient now full comfort care. Appears stable off infusions. Discussed risk/benefit of transfer to hospice with wife, she agrees to transfer. Discussed with PMT.  * Acute on chronic systolic CHF (congestive heart failure) (HCC) --echo LVEF <20%; rheumatic heart disease, s/p mitral valve replacement, cardiogenic shock/ low output heart failure decompensation; aortic stenosis with low gradient. Not TAVR candidate  --did not respond to milrinone, NTG and Lasix infusions; cardiology and nephrology recommended palliative care; patient is now full comfort care; pt at end of life. Appears stable off infusions.  AKI superimposed on CKD stage 3b. AKI secondary to cardiorenal syndrome. --failed to respond to furosemide  infusion --nephrology signed off --comfort care   Chronic atrial fibrillation (HCC) --Atrial flutter with rapid ventricular response; not candidate for AV blockade due to low output heart failure.  --now comfort care; amiodarone and warfarin stopped   Essential HTN   --hypotension secondary to low output heart failure. Currently BP is stable.   Rheumatic heart disease --S/p mechanical mitral valve replacement --warfarin stopped based on above   Type 2 diabetes mellitus with hyperlipidemia (HCC) --no further invention; insulin and CBG checks stopped.   CAD s/p CABG 2002, PCI 06/2020 -- no treatment       Consultants:  Cardiology Nephrology Palliative Medicine  Procedures performed:  PICC line 6/18   Disposition: Hospice care  Diet recommendation:  As desired  DISCHARGE MEDICATION: Allergies as of 03/02/2022       Reactions   Allopurinol Shortness Of Breath   Doxycycline Hives   Farxiga [dapagliflozin]    Can not tolerate - dizzy weak almost passed out        Medication List     STOP taking these medications    clopidogrel 75 MG tablet Commonly known as: PLAVIX   Furoscix 80 MG/10ML Ctkt Generic drug: Furosemide Replaced by: furosemide 80 MG tablet   glipiZIDE 10 MG 24 hr tablet Commonly known as: GLUCOTROL XL   insulin glargine 100 UNIT/ML injection Commonly known as: LANTUS   Januvia 50 MG tablet Generic drug: sitaGLIPtin   OneTouch Delica Lancets 30G Misc   OneTouch Verio test strip Generic drug: glucose blood   pantoprazole 40 MG tablet Commonly known as: PROTONIX   rosuvastatin 20 MG tablet Commonly known as: CRESTOR   sodium bicarbonate 650 MG tablet   torsemide 20 MG tablet Commonly known as: DEMADEX  warfarin 5 MG tablet Commonly known as: COUMADIN       TAKE these medications    furosemide 80 MG tablet Commonly known as: LASIX Take 1 tablet (80 mg total) by mouth 2 (two) times daily. Replaces: Furoscix 80 MG/10ML  Ctkt   nitroGLYCERIN 0.4 MG SL tablet Commonly known as: NITROSTAT Place 1 tablet (0.4 mg total) under the tongue every 5 (five) minutes as needed for chest pain.   polyvinyl alcohol 1.4 % ophthalmic solution Commonly known as: LIQUIFILM TEARS Place 1 drop into both eyes 4 (four) times daily as needed for dry eyes.        Follow-up Information     [facility] Follow up.   Specialty: Home Health Services Why: HHPT- Agency will contact with apt times Contact information: [address] [place] [address] [phone]                Had a good night per wife Ate a little today Has been resting well She is comfortable with transport  Discharge Exam: Filed Weights   02/25/22 0458 02/27/22 0409 02/28/22 0225  Weight: 85.7 kg 87.3 kg 87.9 kg   Physical Exam Vitals reviewed.  Constitutional:      General: He is not in acute distress.    Appearance: He is not ill-appearing or toxic-appearing.  Cardiovascular:     Rate and Rhythm: Normal rate and regular rhythm.     Heart sounds: No murmur heard. Pulmonary:     Effort: Pulmonary effort is normal. No respiratory distress.     Breath sounds: No wheezing, rhonchi or rales.  Neurological:     Mental Status: He is alert.  Psychiatric:        Mood and Affect: Mood normal.        Behavior: Behavior normal.     Comments: Wakes up a bit      Condition at discharge: fair  The results of significant diagnostics from this hospitalization (including imaging, microbiology, ancillary and laboratory) are listed below for reference.   Imaging Studies: ECHOCARDIOGRAM LIMITED  Result Date: 02/26/2022    ECHOCARDIOGRAM LIMITED REPORT   Patient Name:   John [name] Date of Exam: 02/26/2022 Medical Rec #:  [id number]        Height:       71.0 in Accession #:    [id number]       Weight:       189.0 lb Date of Birth:  [birth year]        BSA:          2.058 m Patient Age:    70 years         BP:            98/68 mmHg Patient Gender: M                HR:           120 bpm. Exam Location:  Inpatient Procedure: 2D Echo, Limited Echo, Cardiac Doppler, Color Doppler and            Intracardiac Opacification Agent Indications:    Dyspnea  History:        Patient has prior history of Echocardiogram examinations. CHF,                 CAD, Arrythmias:Atrial Fibrillation; Risk Factors:Hypertension                 and Diabetes.  Mitral Valve: mechanical valve valve is present in the mitral                 position.  Sonographer:    Cleatis Polka Referring Phys: [id number] MAURICIO Jillian Warth ARRIEN IMPRESSIONS  1. Left ventricular ejection fraction, by estimation, is <20%. The left ventricle has severely decreased function. The left ventricle demonstrates global hypokinesis. The left ventricular internal cavity size was severely dilated. Left ventricular diastolic parameters are indeterminate. There is the interventricular septum is flattened in systole, consistent with right ventricular pressure overload.  2. Right ventricular systolic function is severely reduced. There is moderately elevated pulmonary artery systolic pressure.  3. Left atrial size was severely dilated.  4. Right atrial size was moderately dilated.  5. The mitral valve has been repaired/replaced. The mean mitral valve gradient is 4.7 mmHg. There is a mechanical valve present in the mitral position. Echo findings are consistent with normal structure and function of the mitral valve prosthesis.  6. Tricuspid valve regurgitation is mild to moderate.  7. Aotic valve is heavily calcified and barely opens. Suspect severe low-flow/low-gradient AS     . The aortic valve is tricuspid. There is severe calcifcation of the aortic valve. Aortic valve regurgitation is mild. Aortic regurgitation PHT measures 207 msec. Aortic valve area, by VTI measures 1.27 cm. Aortic valve mean gradient measures 9.8 mmHg. Aortic valve Vmax measures 2.11 m/s.  8. The inferior vena  cava is dilated in size with <50% respiratory variability, suggesting right atrial pressure of 15 mmHg. FINDINGS  Left Ventricle: Left ventricular ejection fraction, by estimation, is <20%. The left ventricle has severely decreased function. The left ventricle demonstrates global hypokinesis. Definity contrast agent was given IV to delineate the left ventricular endocardial borders. The left ventricular internal cavity size was severely dilated. The interventricular septum is flattened in systole, consistent with right ventricular pressure overload. Left ventricular diastolic parameters are indeterminate. Right Ventricle: Right ventricular systolic function is severely reduced. There is moderately elevated pulmonary artery systolic pressure. Left Atrium: Left atrial size was severely dilated. Right Atrium: Right atrial size was moderately dilated. Mitral Valve: The mitral valve has been repaired/replaced. There is a mechanical valve present in the mitral position. Echo findings are consistent with normal structure and function of the mitral valve prosthesis. MV peak gradient, 13.6 mmHg. The mean mitral valve gradient is 4.7 mmHg. Tricuspid Valve: Tricuspid valve regurgitation is mild to moderate. Aortic Valve: Aotic valve is heavily calcified and barely opens. Suspect severe low-flow/low-gradient AS. The aortic valve is tricuspid. There is severe calcifcation of the aortic valve. Aortic valve regurgitation is mild. Aortic regurgitation PHT measures 207 msec. Aortic valve mean gradient measures 9.8 mmHg. Aortic valve peak gradient measures 17.9 mmHg. Aortic valve area, by VTI measures 1.27 cm. Pulmonic Valve: Pulmonic valve regurgitation is trivial. Venous: The inferior vena cava is dilated in size with less than 50% respiratory variability, suggesting right atrial pressure of 15 mmHg. Additional Comments: A device lead is visualized. LEFT VENTRICLE PLAX 2D LVIDd:         6.70 cm      Diastology LVIDs:         6.30  cm      LV e' medial:    3.38 cm/s LV PW:         1.20 cm      LV E/e' medial:  42.6 LV IVS:        1.00 cm      LV e' lateral:  5.98 cm/s LVOT diam:     2.20 cm      LV E/e' lateral: 24.1 LV SV:         39 LV SV Index:   19 LVOT Area:     3.80 cm  LV Volumes (MOD) LV vol d, MOD A2C: 376.0 ml LV vol d, MOD A4C: 350.0 ml LV vol s, MOD A2C: 302.0 ml LV vol s, MOD A4C: 286.0 ml LV SV MOD A2C:     74.0 ml LV SV MOD A4C:     350.0 ml LV SV MOD BP:      74.3 ml RIGHT VENTRICLE            IVC RV S prime:     5.61 cm/s  IVC diam: 2.50 cm TAPSE (M-mode): 1.0 cm LEFT ATRIUM         Index LA diam:    6.00 cm 2.91 cm/m  AORTIC VALVE AV Area (Vmax):    1.20 cm AV Area (Vmean):   1.15 cm AV Area (VTI):     1.27 cm AV Vmax:           211.25 cm/s AV Vmean:          150.500 cm/s AV VTI:            0.309 m AV Peak Grad:      17.9 mmHg AV Mean Grad:      9.8 mmHg LVOT Vmax:         66.50 cm/s LVOT Vmean:        45.600 cm/s LVOT VTI:          0.103 m LVOT/AV VTI ratio: 0.33 AI PHT:            207 msec  AORTA Ao Root diam: 3.50 cm Ao Asc diam:  3.00 cm MITRAL VALVE                TRICUSPID VALVE MV Area (PHT): 7.09 cm     TR Peak grad:   45.7 mmHg MV Area VTI:   1.31 cm     TR Vmax:        338.00 cm/s MV Peak grad:  13.6 mmHg MV Mean grad:  4.7 mmHg     SHUNTS MV Vmax:       1.84 m/s     Systemic VTI:  0.10 m MV Vmean:      97.6 cm/s    Systemic Diam: 2.20 cm MV Decel Time: 107 msec MV E velocity: 144.00 cm/s Arvilla Meres MD Electronically signed by Arvilla Meres MD Signature Date/Time: 02/26/2022/3:33:19 PM    Final    DG CHEST PORT 1 VIEW  Result Date: 02/25/2022 CLINICAL DATA:  PICC line placement. EXAM: PORTABLE CHEST 1 VIEW COMPARISON:  One-view chest x-ray 02/22/2022 FINDINGS: The heart is enlarged. Atherosclerotic calcifications are present at the aortic arch. Interval left-sided PICC line placement noted. The tip is at the cavoatrial junction. Single AICD lead is stable. Mild edema and bilateral pleural  effusions have increased. Bibasilar airspace opacity likely reflects atelectasis. IMPRESSION: No active disease. Electronically Signed   By: Marin Roberts M.D.   On: 02/25/2022 12:33   [place] EKG SITE RITE  Result Date: 02/25/2022 If Site Rite image not attached, placement could not be confirmed due to current cardiac rhythm.  US RENAL  Result Date: 02/23/2022 CLINICAL DATA:  AKI. EXAM: RENAL / URINARY TRACT ULTRASOUND COMPLETE COMPARISON:  None Available. FINDINGS: Right Kidney: Renal length: 12 cm.  Echogenicity within normal limits. No mass or hydronephrosis visualized. Left Kidney: Renal length: 10.2 cm. Echogenicity within normal limits. No mass or hydronephrosis visualized. Bladder: Appears normal for degree of bladder distention. Three dural jets not visualized Other: Incidental note of ascites. IMPRESSION: 1. No sonographic evidence of acute renal abnormality. 2. Incidental note of ascites. Electronically Signed   By: Feliberto Harts M.D.   On: 02/23/2022 12:59   CT Head Wo Contrast  Result Date: 02/22/2022 CLINICAL DATA:  Provided history: Mental status change, unknown cause. EXAM: CT HEAD WITHOUT CONTRAST TECHNIQUE: Contiguous axial images were obtained from the base of the skull through the vertex without intravenous contrast. RADIATION DOSE REDUCTION: This exam was performed according to the departmental dose-optimization program which includes automated exposure control, adjustment of the mA and/or kV according to patient size and/or use of iterative reconstruction technique. COMPARISON:  Prior head CT 09/29/2011. FINDINGS: Brain: Mild-to-moderate cerebral atrophy. Mild cerebellar atrophy. 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 Suspected small chronic cortical/subcortical infarct within the high right parietal lobe, new from the prior head CT of 09/29/2011 (series 2, images 22-26). There is no acute  intracranial hemorrhage. No acute demarcated cortical infarct. No extra-axial fluid collection. No evidence of an intracranial mass. No midline shift. Vascular: No hyperdense vessel. Atherosclerotic calcifications. Skull: No fracture or aggressive osseous lesion. Sinuses/Orbits: No mass or acute finding within the imaged orbits. No significant paranasal sinus disease at the imaged levels. IMPRESSION: No evidence of acute intracranial abnormality. 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 Suspected small chronic cortical/subcortical infarct within the high right parietal lobe, new from the prior head CT of 09/29/2011 (MCA/ACA watershed territory). 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 Mild-to-moderate cerebral atrophy. Mild cerebellar atrophy. Electronically Signed   By: Jackey Loge D.O.   On: 02/22/2022 14:56   DG Chest Port 1 View  Result Date: 02/22/2022 CLINICAL DATA:  weakness EXAM: PORTABLE CHEST 1 VIEW COMPARISON:  December 13, 2021 FINDINGS: Again seen are the sternotomy wires, CABG changes and mitral valve replacement changes. Stable right subclavian single lead pacemaker defibrillator. Cardiomegaly. Bilateral pleural effusion greater on the left and is new. Mild central pulmonary vascular congestion. No focal consolidation. The visualized skeletal structures are unremarkable. IMPRESSION: Bilateral small pleural effusion greater on the left and is new. Cardiomegaly and mild central pulmonary vascular congestion. Electronically Signed   By: Marjo Bicker M.D.   On: 02/22/2022 14:47    Microbiology: Results for orders placed or performed during the hospital encounter of 11/17/21  Resp Panel by RT-PCR (Flu A&B, Covid) Nasopharyngeal Swab     Status: None   Collection Time: 11/17/21 12:52 PM   Specimen: Nasopharyngeal Swab; Nasopharyngeal(NP) swabs in vial transport medium   Result Value Ref Range Status   SARS Coronavirus 2 by RT PCR NEGATIVE NEGATIVE Final    Comment: (NOTE) SARS-CoV-2 target nucleic acids are NOT DETECTED.  The SARS-CoV-2 RNA is generally detectable in upper respiratory specimens during the acute phase of infection. The lowest concentration of SARS-CoV-2 viral copies this assay can detect is 138 copies/mL. A negative result does not preclude SARS-Cov-2 infection and should not be used as the sole basis for treatment or other patient management decisions. A negative result may occur with  improper specimen collection/handling, submission of specimen other than nasopharyngeal swab, presence of viral mutation(s) within the areas targeted by this assay,  and inadequate number of viral copies(<138 copies/mL). A negative result must be combined with clinical observations, patient history, and epidemiological information. The expected result is Negative.  Fact Sheet for Patients:  BloggerCourse.com  Fact Sheet for Healthcare Providers:  SeriousBroker.it  This test is no t yet approved or cleared by the [place] FDA and  has been authorized for detection and/or diagnosis of SARS-CoV-2 by FDA under an Emergency Use Authorization (EUA). This EUA will remain  in effect (meaning this test can be used) for the duration of the COVID-19 declaration under Section 564(b)(1) of the Act, 21 U.S.C.section 360bbb-3(b)(1), unless the authorization is terminated  or revoked sooner.       Influenza A by PCR NEGATIVE NEGATIVE Final   Influenza B by PCR NEGATIVE NEGATIVE Final    Comment: (NOTE) The Xpert Xpress SARS-CoV-2/FLU/RSV plus assay is intended as an aid in the diagnosis of influenza from Nasopharyngeal swab specimens and should not be used as a sole basis for treatment. Nasal washings and aspirates are unacceptable for Xpert Xpress SARS-CoV-2/FLU/RSV testing.  Fact Sheet for  Patients: BloggerCourse.com  Fact Sheet for Healthcare Providers: SeriousBroker.it  This test is not yet approved or cleared by the [place] FDA and has been authorized for detection and/or diagnosis of SARS-CoV-2 by FDA under an Emergency Use Authorization (EUA). This EUA will remain in effect (meaning this test can be used) for the duration of the COVID-19 declaration under Section 564(b)(1) of the Act, 21 U.S.C. section 360bbb-3(b)(1), unless the authorization is terminated or revoked.  Performed at [facility], [address] [place], [place] [address]     Labs: CBC: Recent Labs  Lab 02/25/22 0300 02/26/22 0452  WBC 4.2 4.4  HGB 12.8* 11.9*  HCT 39.8 36.3*  MCV 86.3 84.6  PLT 164 158   Basic Metabolic Panel: Recent Labs  Lab 02/25/22 0300 02/26/22 0452 02/27/22 0427 02/28/22 0437  NA 135 135 135 132*  K 3.8 3.7 3.6 4.0  CL 95* 94* 95* 93*  CO2 21* 24 26 24   GLUCOSE 149* 173* 149* 236*  BUN 141* 136* 141* 142*  CREATININE 4.20* 4.32* 4.16* 4.45*  CALCIUM 8.6* 8.5* 8.5* 8.5*  PHOS 5.1* 4.6  --   --    Liver Function Tests: Recent Labs  Lab 02/25/22 0300 02/26/22 0452  ALBUMIN 2.8* 2.6*   CBG: Recent Labs  Lab 02/28/22 1159 02/28/22 1606 02/28/22 2135 03/01/22 0700 03/01/22 1151  GLUCAP 334* 319* 246* 143* 153*    Discharge time spent: less than 30 minutes.  Signed: Brendia Sacks, MD [facility] 03/02/2022

## 2022-03-05 ENCOUNTER — Ambulatory Visit: Payer: PPO | Admitting: Family Medicine

## 2022-03-05 DIAGNOSIS — I872 Venous insufficiency (chronic) (peripheral): Secondary | ICD-10-CM | POA: Diagnosis not present

## 2022-03-07 ENCOUNTER — Telehealth: Payer: Self-pay | Admitting: Family Medicine

## 2022-03-07 NOTE — Telephone Encounter (Signed)
Called to schedule AWV,was told patient passed away 03/27/2022

## 2022-03-10 DEATH — deceased

## 2022-03-23 ENCOUNTER — Encounter (HOSPITAL_COMMUNITY): Payer: HMO | Admitting: Cardiology

## 2022-03-23 ENCOUNTER — Ambulatory Visit: Payer: HMO | Admitting: Cardiology

## 2022-05-25 ENCOUNTER — Ambulatory Visit: Payer: HMO | Admitting: Cardiology

## 2022-09-28 IMAGING — CR DG CHEST 2V
2 series · 2 of 2 positions shown · non-contrast
Comparison: 11/17/2021

CLINICAL DATA: Dyspnea

EXAM:
CHEST - 2 VIEW

[chest lat]
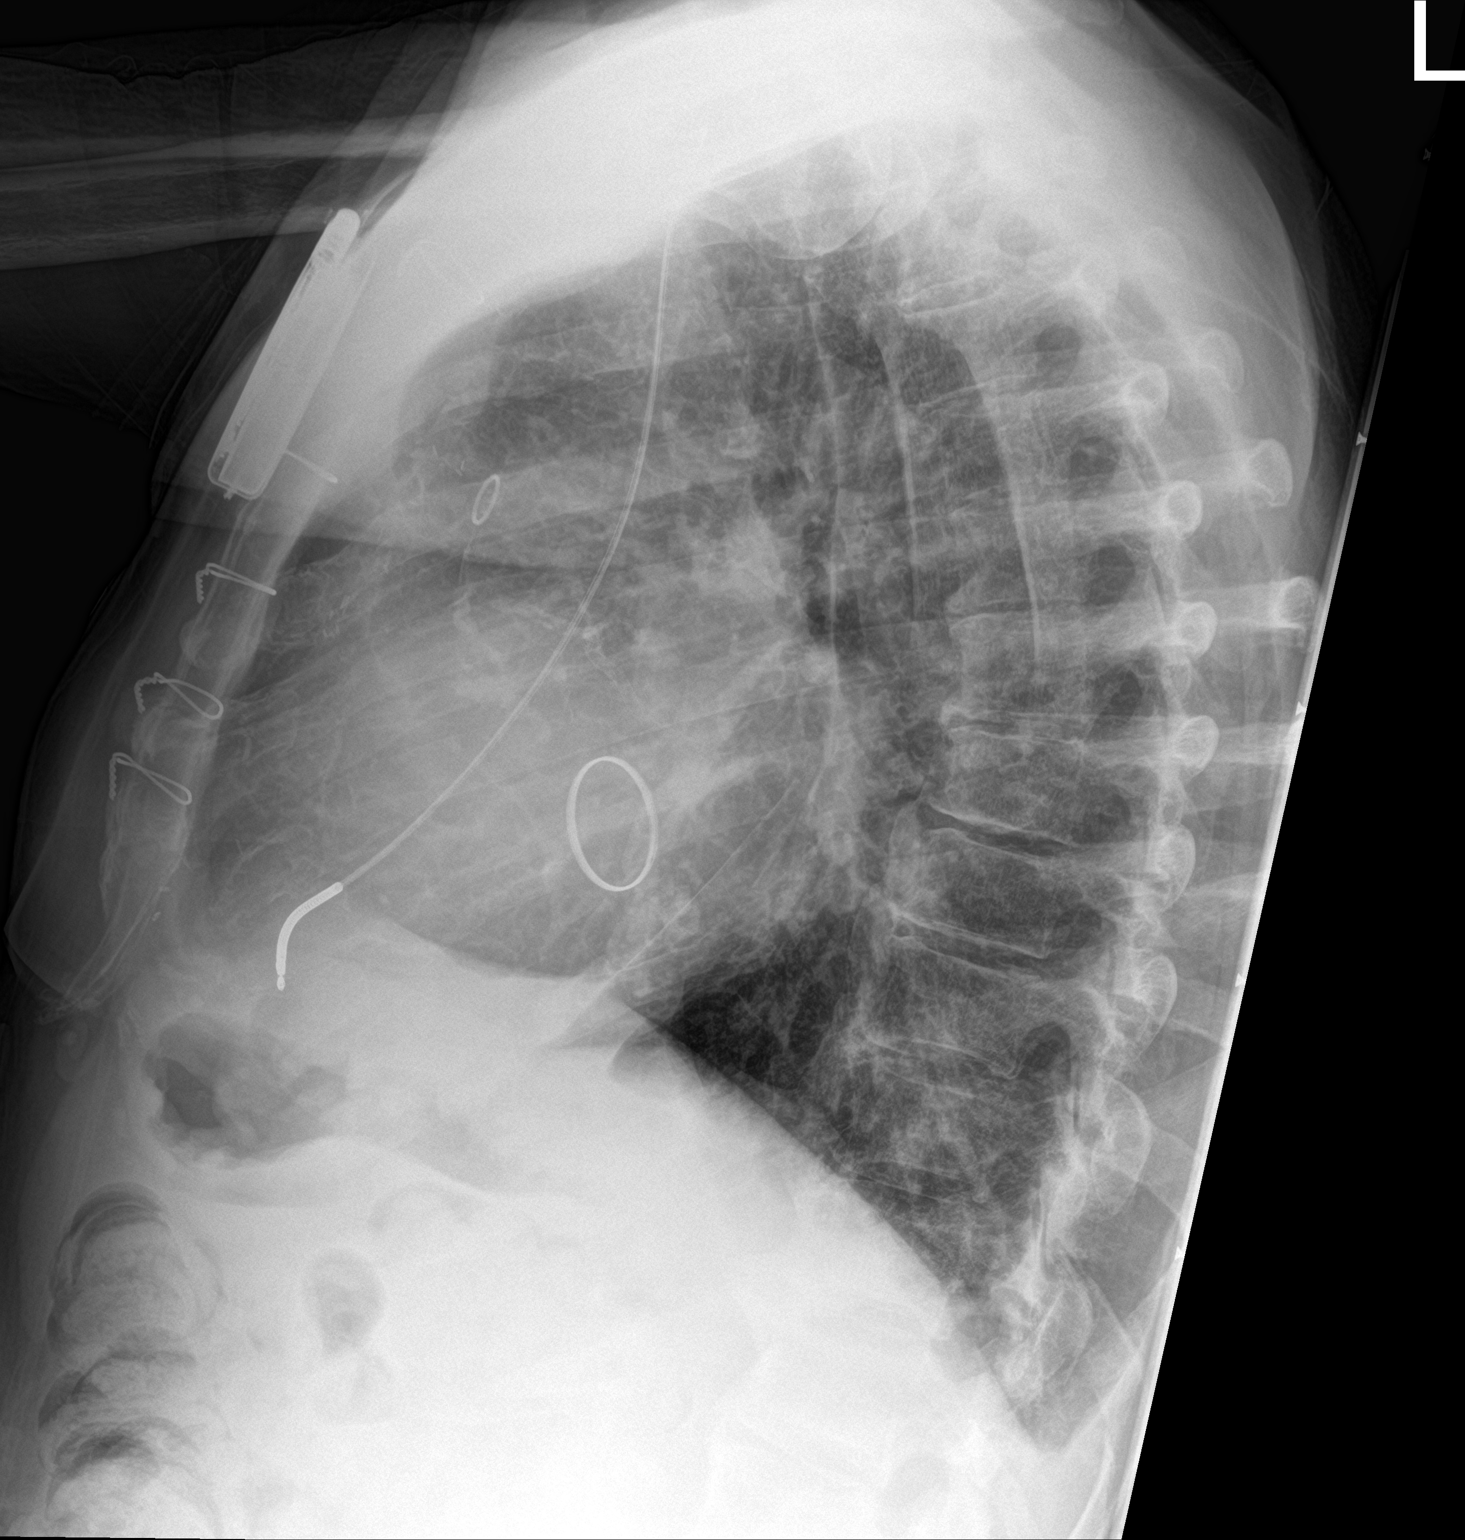

[chest ap]
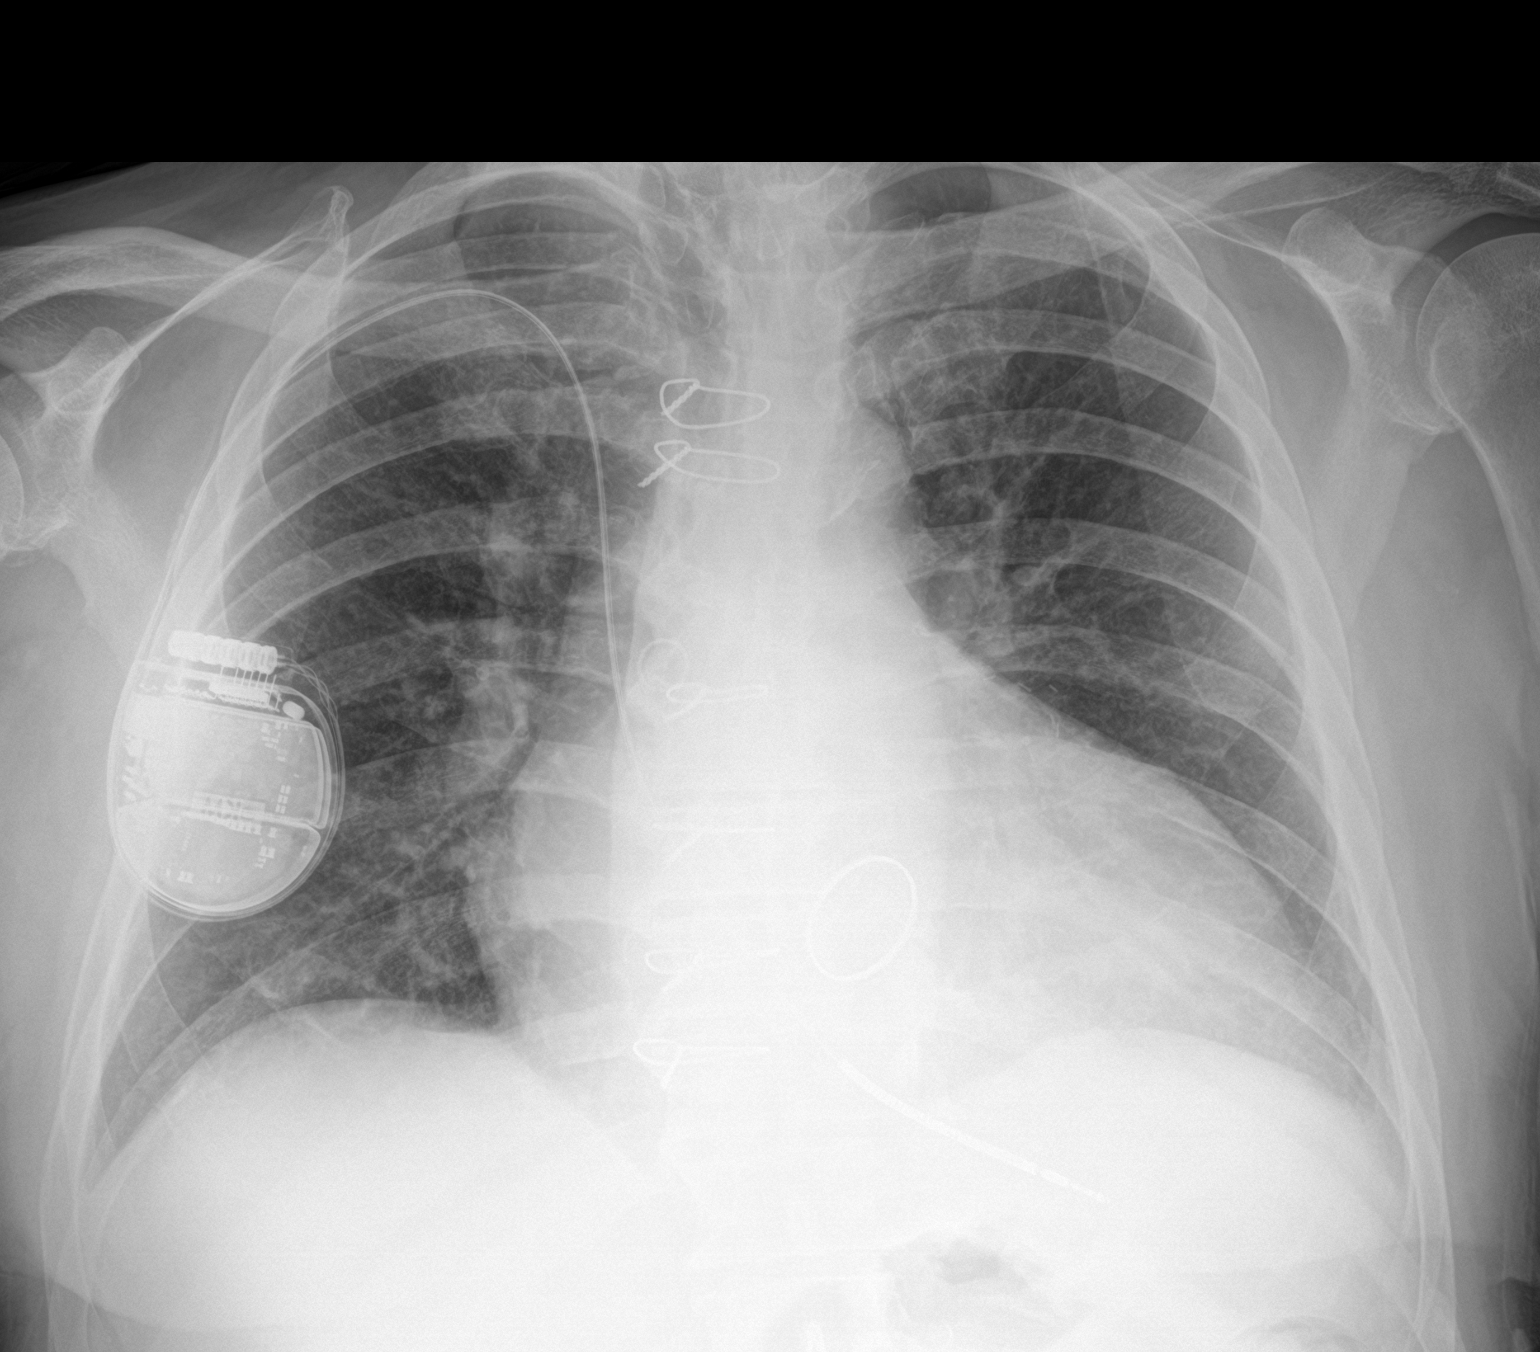

[2 of 2 positions shown; findings below may reference images not displayed]

FINDINGS: Lungs are clear. No pneumothorax or pleural effusion. Coronary
artery bypass grafting and mitral valve replacement been performed.
Cardiomegaly stable. Right subclavian single lead pacemaker
defibrillator is unchanged. Pulmonary vascularity is normal. No
acute bone abnormality.
IMPRESSION: No active cardiopulmonary disease.  Stable cardiomegaly.

## 2022-10-04 ENCOUNTER — Ambulatory Visit: Payer: PPO | Admitting: Oncology
